# Patient Record
Sex: Male | Born: 1945
Health system: Southern US, Community
[De-identification: ages and names within clinical notes are randomized; demographics above are authoritative.]

## PROBLEM LIST (undated history)

## (undated) DIAGNOSIS — K2210 Ulcer of esophagus without bleeding: Secondary | ICD-10-CM

## (undated) DIAGNOSIS — N400 Enlarged prostate without lower urinary tract symptoms: Secondary | ICD-10-CM

## (undated) DIAGNOSIS — K5730 Diverticulosis of large intestine without perforation or abscess without bleeding: Secondary | ICD-10-CM

## (undated) DIAGNOSIS — S0101XA Laceration without foreign body of scalp, initial encounter: Secondary | ICD-10-CM

## (undated) DIAGNOSIS — M5126 Other intervertebral disc displacement, lumbar region: Secondary | ICD-10-CM

## (undated) DIAGNOSIS — H269 Unspecified cataract: Secondary | ICD-10-CM

## (undated) DIAGNOSIS — K635 Polyp of colon: Secondary | ICD-10-CM

## (undated) DIAGNOSIS — I10 Essential (primary) hypertension: Secondary | ICD-10-CM

## (undated) DIAGNOSIS — R0602 Shortness of breath: Secondary | ICD-10-CM

## (undated) DIAGNOSIS — E785 Hyperlipidemia, unspecified: Secondary | ICD-10-CM

## (undated) DIAGNOSIS — M069 Rheumatoid arthritis, unspecified: Secondary | ICD-10-CM

## (undated) DIAGNOSIS — Z202 Contact with and (suspected) exposure to infections with a predominantly sexual mode of transmission: Secondary | ICD-10-CM

## (undated) DIAGNOSIS — L259 Unspecified contact dermatitis, unspecified cause: Secondary | ICD-10-CM

## (undated) DIAGNOSIS — K219 Gastro-esophageal reflux disease without esophagitis: Secondary | ICD-10-CM

## (undated) DIAGNOSIS — I219 Acute myocardial infarction, unspecified: Secondary | ICD-10-CM

## (undated) DIAGNOSIS — T7840XA Allergy, unspecified, initial encounter: Secondary | ICD-10-CM

## (undated) DIAGNOSIS — N200 Calculus of kidney: Secondary | ICD-10-CM

## (undated) DIAGNOSIS — I2510 Atherosclerotic heart disease of native coronary artery without angina pectoris: Secondary | ICD-10-CM

## (undated) DIAGNOSIS — Z01810 Encounter for preprocedural cardiovascular examination: Secondary | ICD-10-CM

## (undated) DIAGNOSIS — J449 Chronic obstructive pulmonary disease, unspecified: Secondary | ICD-10-CM

## (undated) DIAGNOSIS — S060X1A Concussion with loss of consciousness of 30 minutes or less, initial encounter: Secondary | ICD-10-CM

## (undated) HISTORY — DX: Unspecified contact dermatitis, unspecified cause: L25.9

## (undated) HISTORY — DX: Rheumatoid arthritis, unspecified: M06.9

## (undated) HISTORY — DX: Allergy, unspecified, initial encounter: T78.40XA

## (undated) HISTORY — DX: Encounter for preprocedural cardiovascular examination: Z01.810

## (undated) HISTORY — DX: Chronic obstructive pulmonary disease, unspecified: J44.9

## (undated) HISTORY — DX: Hyperlipidemia, unspecified: E78.5

## (undated) HISTORY — DX: Calculus of kidney: N20.0

## (undated) HISTORY — PX: KNEE ARTHROSCOPY: SHX127

## (undated) HISTORY — DX: Acute myocardial infarction, unspecified: I21.9

## (undated) HISTORY — DX: Contact with and (suspected) exposure to infections with a predominantly sexual mode of transmission: Z20.2

## (undated) HISTORY — DX: Atherosclerotic heart disease of native coronary artery without angina pectoris: I25.10

## (undated) HISTORY — DX: Benign prostatic hyperplasia without lower urinary tract symptoms: N40.0

## (undated) HISTORY — PX: TONSILLECTOMY: SUR1361

## (undated) HISTORY — DX: Diverticulosis of large intestine without perforation or abscess without bleeding: K57.30

## (undated) HISTORY — DX: Essential (primary) hypertension: I10

## (undated) HISTORY — PX: POLYPECTOMY: SHX149

## (undated) HISTORY — PX: UPPER GASTROINTESTINAL ENDOSCOPY: SHX188

## (undated) HISTORY — PX: CORONARY ANGIOPLASTY WITH STENT PLACEMENT: SHX49

## (undated) HISTORY — PX: BACK SURGERY: SHX140

## (undated) HISTORY — DX: Polyp of colon: K63.5

## (undated) HISTORY — DX: Laceration without foreign body of scalp, initial encounter: S01.01XA

## (undated) HISTORY — PX: EYE SURGERY: SHX253

## (undated) HISTORY — DX: Ulcer of esophagus without bleeding: K22.10

## (undated) HISTORY — DX: Unspecified cataract: H26.9

## (undated) HISTORY — DX: Gastro-esophageal reflux disease without esophagitis: K21.9

## (undated) HISTORY — DX: Shortness of breath: R06.02

## (undated) HISTORY — DX: Concussion with loss of consciousness of 30 minutes or less, initial encounter: S06.0X1A

---

## 2003-01-20 ENCOUNTER — Ambulatory Visit (HOSPITAL_BASED_OUTPATIENT_CLINIC_OR_DEPARTMENT_OTHER): Admission: RE | Admit: 2003-01-20 | Discharge: 2003-01-20 | Payer: Self-pay | Admitting: Urology

## 2003-07-02 ENCOUNTER — Encounter: Admission: RE | Admit: 2003-07-02 | Discharge: 2003-07-02 | Payer: Self-pay | Admitting: Surgery

## 2003-10-21 ENCOUNTER — Encounter: Payer: Self-pay | Admitting: Gastroenterology

## 2003-10-21 DIAGNOSIS — D126 Benign neoplasm of colon, unspecified: Secondary | ICD-10-CM | POA: Insufficient documentation

## 2003-10-21 DIAGNOSIS — K5730 Diverticulosis of large intestine without perforation or abscess without bleeding: Secondary | ICD-10-CM | POA: Insufficient documentation

## 2003-11-25 ENCOUNTER — Emergency Department (HOSPITAL_COMMUNITY): Admission: EM | Admit: 2003-11-25 | Discharge: 2003-11-26 | Payer: Self-pay | Admitting: Emergency Medicine

## 2005-01-12 ENCOUNTER — Ambulatory Visit: Payer: Self-pay | Admitting: Family Medicine

## 2005-01-31 ENCOUNTER — Emergency Department (HOSPITAL_COMMUNITY): Admission: EM | Admit: 2005-01-31 | Discharge: 2005-01-31 | Payer: Self-pay | Admitting: Family Medicine

## 2005-03-05 ENCOUNTER — Ambulatory Visit: Payer: Self-pay | Admitting: Family Medicine

## 2005-04-07 ENCOUNTER — Emergency Department (HOSPITAL_COMMUNITY): Admission: EM | Admit: 2005-04-07 | Discharge: 2005-04-07 | Payer: Self-pay | Admitting: Family Medicine

## 2005-08-10 ENCOUNTER — Emergency Department (HOSPITAL_COMMUNITY): Admission: EM | Admit: 2005-08-10 | Discharge: 2005-08-10 | Payer: Self-pay | Admitting: Emergency Medicine

## 2006-04-26 ENCOUNTER — Emergency Department (HOSPITAL_COMMUNITY): Admission: EM | Admit: 2006-04-26 | Discharge: 2006-04-26 | Payer: Self-pay | Admitting: Family Medicine

## 2006-05-31 ENCOUNTER — Encounter: Payer: Self-pay | Admitting: Family Medicine

## 2006-11-04 DIAGNOSIS — N138 Other obstructive and reflux uropathy: Secondary | ICD-10-CM | POA: Insufficient documentation

## 2006-11-04 DIAGNOSIS — N401 Enlarged prostate with lower urinary tract symptoms: Secondary | ICD-10-CM | POA: Insufficient documentation

## 2006-11-04 DIAGNOSIS — N400 Enlarged prostate without lower urinary tract symptoms: Secondary | ICD-10-CM | POA: Insufficient documentation

## 2006-12-12 ENCOUNTER — Ambulatory Visit: Payer: Self-pay | Admitting: Family Medicine

## 2006-12-12 DIAGNOSIS — L259 Unspecified contact dermatitis, unspecified cause: Secondary | ICD-10-CM | POA: Insufficient documentation

## 2007-01-09 ENCOUNTER — Emergency Department (HOSPITAL_COMMUNITY): Admission: EM | Admit: 2007-01-09 | Discharge: 2007-01-09 | Payer: Self-pay | Admitting: Family Medicine

## 2007-03-09 ENCOUNTER — Emergency Department (HOSPITAL_COMMUNITY): Admission: EM | Admit: 2007-03-09 | Discharge: 2007-03-09 | Payer: Self-pay | Admitting: Emergency Medicine

## 2007-06-06 ENCOUNTER — Ambulatory Visit: Payer: Self-pay | Admitting: Family Medicine

## 2007-06-13 ENCOUNTER — Encounter: Payer: Self-pay | Admitting: Family Medicine

## 2007-07-01 ENCOUNTER — Ambulatory Visit: Payer: Self-pay | Admitting: Gastroenterology

## 2007-07-01 DIAGNOSIS — R1310 Dysphagia, unspecified: Secondary | ICD-10-CM | POA: Insufficient documentation

## 2007-07-01 DIAGNOSIS — N200 Calculus of kidney: Secondary | ICD-10-CM | POA: Insufficient documentation

## 2007-07-02 ENCOUNTER — Encounter: Payer: Self-pay | Admitting: Gastroenterology

## 2007-07-02 ENCOUNTER — Ambulatory Visit: Payer: Self-pay | Admitting: Gastroenterology

## 2007-07-03 ENCOUNTER — Encounter: Payer: Self-pay | Admitting: Family Medicine

## 2007-07-04 ENCOUNTER — Telehealth: Payer: Self-pay | Admitting: Family Medicine

## 2007-07-09 ENCOUNTER — Encounter: Payer: Self-pay | Admitting: Gastroenterology

## 2007-07-22 ENCOUNTER — Telehealth: Payer: Self-pay | Admitting: Family Medicine

## 2008-10-14 ENCOUNTER — Encounter (INDEPENDENT_AMBULATORY_CARE_PROVIDER_SITE_OTHER): Payer: Self-pay | Admitting: *Deleted

## 2009-01-05 ENCOUNTER — Telehealth: Payer: Self-pay | Admitting: Family Medicine

## 2009-01-07 ENCOUNTER — Ambulatory Visit: Payer: Self-pay | Admitting: Family Medicine

## 2009-01-07 LAB — CONVERTED CEMR LAB
Bilirubin Urine: NEGATIVE
Blood in Urine, dipstick: NEGATIVE
Glucose, Urine, Semiquant: NEGATIVE
Ketones, urine, test strip: NEGATIVE
Nitrite: NEGATIVE
Specific Gravity, Urine: 1.02
Urobilinogen, UA: 1
WBC Urine, dipstick: NEGATIVE
pH: 7

## 2009-01-10 LAB — CONVERTED CEMR LAB
ALT: 22 units/L (ref 0–53)
AST: 24 units/L (ref 0–37)
Albumin: 3.9 g/dL (ref 3.5–5.2)
Alkaline Phosphatase: 46 units/L (ref 39–117)
BUN: 12 mg/dL (ref 6–23)
Basophils Absolute: 0.1 10*3/uL (ref 0.0–0.1)
Basophils Relative: 2 % (ref 0.0–3.0)
Bilirubin, Direct: 0.1 mg/dL (ref 0.0–0.3)
CO2: 30 meq/L (ref 19–32)
Calcium: 9 mg/dL (ref 8.4–10.5)
Chloride: 107 meq/L (ref 96–112)
Cholesterol: 178 mg/dL (ref 0–200)
Creatinine, Ser: 0.9 mg/dL (ref 0.4–1.5)
Eosinophils Absolute: 0.7 10*3/uL (ref 0.0–0.7)
Eosinophils Relative: 9.7 % — ABNORMAL HIGH (ref 0.0–5.0)
GFR calc non Af Amer: 90.41 mL/min (ref >60)
Glucose, Bld: 92 mg/dL (ref 70–99)
HCT: 42.8 % (ref 39.0–52.0)
HDL: 39.6 mg/dL (ref >39.00)
Hemoglobin: 14.3 g/dL (ref 13.0–17.0)
LDL Cholesterol: 111 mg/dL — ABNORMAL HIGH (ref 0–99)
Lymphocytes Relative: 26.3 % (ref 12.0–46.0)
Lymphs Abs: 1.8 10*3/uL (ref 0.7–4.0)
MCHC: 33.5 g/dL (ref 30.0–36.0)
MCV: 90.2 fL (ref 78.0–100.0)
Monocytes Absolute: 0.7 10*3/uL (ref 0.1–1.0)
Monocytes Relative: 10.5 % (ref 3.0–12.0)
Neutro Abs: 3.6 10*3/uL (ref 1.4–7.7)
Neutrophils Relative %: 51.5 % (ref 43.0–77.0)
PSA: 1.86 ng/mL (ref 0.10–4.00)
Platelets: 201 10*3/uL (ref 150.0–400.0)
Potassium: 4.4 meq/L (ref 3.5–5.1)
RBC: 4.75 M/uL (ref 4.22–5.81)
RDW: 12.6 % (ref 11.5–14.6)
Sodium: 142 meq/L (ref 135–145)
TSH: 1.76 microintl units/mL (ref 0.35–5.50)
Total Bilirubin: 0.9 mg/dL (ref 0.3–1.2)
Total CHOL/HDL Ratio: 4
Total Protein: 7 g/dL (ref 6.0–8.3)
Triglycerides: 137 mg/dL (ref 0.0–149.0)
VLDL: 27.4 mg/dL (ref 0.0–40.0)
WBC: 6.9 10*3/uL (ref 4.5–10.5)

## 2009-01-11 ENCOUNTER — Ambulatory Visit: Payer: Self-pay | Admitting: Family Medicine

## 2009-01-13 ENCOUNTER — Telehealth (INDEPENDENT_AMBULATORY_CARE_PROVIDER_SITE_OTHER): Payer: Self-pay

## 2009-01-17 ENCOUNTER — Encounter (HOSPITAL_COMMUNITY): Admission: RE | Admit: 2009-01-17 | Discharge: 2009-02-04 | Payer: Self-pay | Admitting: Family Medicine

## 2009-01-17 ENCOUNTER — Ambulatory Visit: Payer: Self-pay

## 2009-01-17 ENCOUNTER — Ambulatory Visit: Payer: Self-pay | Admitting: Cardiology

## 2009-02-11 ENCOUNTER — Ambulatory Visit: Payer: Self-pay | Admitting: Gastroenterology

## 2009-02-14 ENCOUNTER — Ambulatory Visit: Payer: Self-pay | Admitting: Gastroenterology

## 2009-02-17 ENCOUNTER — Encounter: Payer: Self-pay | Admitting: Gastroenterology

## 2009-02-17 HISTORY — PX: ESOPHAGOGASTRODUODENOSCOPY (EGD) WITH ESOPHAGEAL DILATION: SHX5812

## 2009-02-22 ENCOUNTER — Emergency Department (HOSPITAL_COMMUNITY): Admission: EM | Admit: 2009-02-22 | Discharge: 2009-02-22 | Payer: Self-pay | Admitting: Emergency Medicine

## 2009-02-25 ENCOUNTER — Encounter: Payer: Self-pay | Admitting: Family Medicine

## 2009-02-28 ENCOUNTER — Ambulatory Visit: Payer: Self-pay | Admitting: Family Medicine

## 2009-02-28 DIAGNOSIS — S060X1A Concussion with loss of consciousness of 30 minutes or less, initial encounter: Secondary | ICD-10-CM | POA: Insufficient documentation

## 2009-02-28 DIAGNOSIS — S0100XA Unspecified open wound of scalp, initial encounter: Secondary | ICD-10-CM | POA: Insufficient documentation

## 2009-03-01 ENCOUNTER — Ambulatory Visit: Payer: Self-pay | Admitting: Gastroenterology

## 2009-03-08 ENCOUNTER — Encounter: Payer: Self-pay | Admitting: Gastroenterology

## 2009-04-29 ENCOUNTER — Encounter: Payer: Self-pay | Admitting: Family Medicine

## 2009-05-06 ENCOUNTER — Ambulatory Visit: Payer: Self-pay | Admitting: Family Medicine

## 2009-05-06 LAB — CONVERTED CEMR LAB
Bilirubin Urine: NEGATIVE
Blood in Urine, dipstick: NEGATIVE
Glucose, Urine, Semiquant: NEGATIVE
Ketones, urine, test strip: NEGATIVE
Nitrite: NEGATIVE
Protein, U semiquant: NEGATIVE
Specific Gravity, Urine: 1.02
Urobilinogen, UA: 1
WBC Urine, dipstick: NEGATIVE
pH: 5.5

## 2009-05-31 ENCOUNTER — Ambulatory Visit: Payer: Self-pay | Admitting: Family Medicine

## 2009-05-31 DIAGNOSIS — R0602 Shortness of breath: Secondary | ICD-10-CM | POA: Insufficient documentation

## 2009-06-01 ENCOUNTER — Telehealth: Payer: Self-pay | Admitting: Family Medicine

## 2009-06-02 ENCOUNTER — Encounter (INDEPENDENT_AMBULATORY_CARE_PROVIDER_SITE_OTHER): Payer: Self-pay | Admitting: *Deleted

## 2009-06-02 ENCOUNTER — Ambulatory Visit: Payer: Self-pay | Admitting: Cardiology

## 2009-06-02 ENCOUNTER — Telehealth (INDEPENDENT_AMBULATORY_CARE_PROVIDER_SITE_OTHER): Payer: Self-pay | Admitting: Physician Assistant

## 2009-06-03 ENCOUNTER — Ambulatory Visit: Payer: Self-pay | Admitting: Cardiology

## 2009-06-03 ENCOUNTER — Inpatient Hospital Stay (HOSPITAL_BASED_OUTPATIENT_CLINIC_OR_DEPARTMENT_OTHER): Admission: RE | Admit: 2009-06-03 | Discharge: 2009-06-03 | Payer: Self-pay | Admitting: Cardiology

## 2009-06-03 ENCOUNTER — Ambulatory Visit (HOSPITAL_COMMUNITY): Admission: AD | Admit: 2009-06-03 | Discharge: 2009-06-04 | Payer: Self-pay | Admitting: Cardiology

## 2009-06-03 LAB — CONVERTED CEMR LAB
INR: 1 (ref <1.50)
Prothrombin Time: 13.1 s (ref 11.6–15.2)
aPTT: 28 s (ref 24–37)

## 2009-06-07 ENCOUNTER — Encounter: Payer: Self-pay | Admitting: Cardiology

## 2009-06-07 ENCOUNTER — Telehealth: Payer: Self-pay | Admitting: Cardiology

## 2009-06-07 ENCOUNTER — Inpatient Hospital Stay (HOSPITAL_COMMUNITY): Admission: EM | Admit: 2009-06-07 | Discharge: 2009-06-09 | Payer: Self-pay | Admitting: Radiology

## 2009-06-07 ENCOUNTER — Ambulatory Visit: Payer: Self-pay | Admitting: Cardiology

## 2009-06-10 DIAGNOSIS — E785 Hyperlipidemia, unspecified: Secondary | ICD-10-CM | POA: Insufficient documentation

## 2009-06-10 DIAGNOSIS — I25118 Atherosclerotic heart disease of native coronary artery with other forms of angina pectoris: Secondary | ICD-10-CM | POA: Insufficient documentation

## 2009-06-10 DIAGNOSIS — K219 Gastro-esophageal reflux disease without esophagitis: Secondary | ICD-10-CM | POA: Insufficient documentation

## 2009-06-10 DIAGNOSIS — K2270 Barrett's esophagus without dysplasia: Secondary | ICD-10-CM | POA: Insufficient documentation

## 2009-06-15 ENCOUNTER — Encounter: Payer: Self-pay | Admitting: Cardiology

## 2009-06-20 ENCOUNTER — Ambulatory Visit: Payer: Self-pay | Admitting: Cardiology

## 2009-06-21 ENCOUNTER — Ambulatory Visit: Payer: Self-pay | Admitting: Cardiology

## 2009-06-22 ENCOUNTER — Encounter: Payer: Self-pay | Admitting: Cardiology

## 2009-06-30 LAB — CONVERTED CEMR LAB
ALT: 25 units/L (ref 0–53)
AST: 23 units/L (ref 0–37)
Albumin: 3.6 g/dL (ref 3.5–5.2)
Alkaline Phosphatase: 44 units/L (ref 39–117)
Bilirubin, Direct: 0.2 mg/dL (ref 0.0–0.3)
Cholesterol: 141 mg/dL (ref 0–200)
HDL: 36.6 mg/dL — ABNORMAL LOW (ref >39.00)
LDL Cholesterol: 81 mg/dL (ref 0–99)
Total Bilirubin: 1.1 mg/dL (ref 0.3–1.2)
Total CHOL/HDL Ratio: 4
Total Protein: 6.4 g/dL (ref 6.0–8.3)
Triglycerides: 116 mg/dL (ref 0.0–149.0)
VLDL: 23.2 mg/dL (ref 0.0–40.0)

## 2009-10-14 ENCOUNTER — Emergency Department (HOSPITAL_COMMUNITY): Admission: EM | Admit: 2009-10-14 | Discharge: 2009-10-14 | Payer: Self-pay | Admitting: Emergency Medicine

## 2009-10-21 ENCOUNTER — Ambulatory Visit: Payer: Self-pay | Admitting: Family Medicine

## 2009-10-21 DIAGNOSIS — S139XXA Sprain of joints and ligaments of unspecified parts of neck, initial encounter: Secondary | ICD-10-CM | POA: Insufficient documentation

## 2009-10-21 DIAGNOSIS — S335XXA Sprain of ligaments of lumbar spine, initial encounter: Secondary | ICD-10-CM | POA: Insufficient documentation

## 2009-11-02 ENCOUNTER — Encounter (INDEPENDENT_AMBULATORY_CARE_PROVIDER_SITE_OTHER): Payer: Self-pay | Admitting: *Deleted

## 2009-11-08 ENCOUNTER — Encounter
Admission: RE | Admit: 2009-11-08 | Discharge: 2010-02-01 | Payer: Self-pay | Source: Home / Self Care | Attending: Family Medicine | Admitting: Family Medicine

## 2009-11-08 ENCOUNTER — Encounter: Payer: Self-pay | Admitting: Family Medicine

## 2009-11-24 ENCOUNTER — Ambulatory Visit: Payer: Self-pay | Admitting: Cardiology

## 2009-12-16 ENCOUNTER — Ambulatory Visit: Payer: Self-pay | Admitting: Family Medicine

## 2009-12-16 DIAGNOSIS — H9319 Tinnitus, unspecified ear: Secondary | ICD-10-CM | POA: Insufficient documentation

## 2009-12-16 DIAGNOSIS — R42 Dizziness and giddiness: Secondary | ICD-10-CM | POA: Insufficient documentation

## 2009-12-21 ENCOUNTER — Ambulatory Visit: Payer: Self-pay | Admitting: Family Medicine

## 2009-12-23 ENCOUNTER — Telehealth: Payer: Self-pay | Admitting: Family Medicine

## 2009-12-23 LAB — CONVERTED CEMR LAB
BUN: 23 mg/dL (ref 6–23)
Creatinine, Ser: 1 mg/dL (ref 0.4–1.5)

## 2009-12-28 ENCOUNTER — Encounter: Admission: RE | Admit: 2009-12-28 | Discharge: 2009-12-28 | Payer: Self-pay | Admitting: Family Medicine

## 2010-01-04 ENCOUNTER — Telehealth: Payer: Self-pay | Admitting: Family Medicine

## 2010-01-04 DIAGNOSIS — R51 Headache: Secondary | ICD-10-CM | POA: Insufficient documentation

## 2010-01-04 DIAGNOSIS — R519 Headache, unspecified: Secondary | ICD-10-CM | POA: Insufficient documentation

## 2010-01-26 ENCOUNTER — Ambulatory Visit: Payer: Self-pay | Admitting: Family Medicine

## 2010-01-26 LAB — CONVERTED CEMR LAB
Bilirubin Urine: NEGATIVE
Blood in Urine, dipstick: NEGATIVE
Glucose, Urine, Semiquant: NEGATIVE
Ketones, urine, test strip: NEGATIVE
Nitrite: NEGATIVE
Protein, U semiquant: NEGATIVE
Specific Gravity, Urine: 1.025
Urobilinogen, UA: 0.2
WBC Urine, dipstick: NEGATIVE
pH: 5.5

## 2010-02-01 ENCOUNTER — Encounter: Payer: Self-pay | Admitting: Family Medicine

## 2010-02-01 ENCOUNTER — Ambulatory Visit: Payer: Self-pay | Admitting: Family Medicine

## 2010-02-01 ENCOUNTER — Ambulatory Visit: Admission: RE | Admit: 2010-02-01 | Discharge: 2010-02-01 | Payer: Self-pay | Source: Home / Self Care

## 2010-02-01 DIAGNOSIS — R55 Syncope and collapse: Secondary | ICD-10-CM | POA: Insufficient documentation

## 2010-02-01 LAB — CONVERTED CEMR LAB
ALT: 23 units/L (ref 0–53)
AST: 25 units/L (ref 0–37)
Albumin: 3.8 g/dL (ref 3.5–5.2)
Alkaline Phosphatase: 53 units/L (ref 39–117)
BUN: 19 mg/dL (ref 6–23)
Basophils Absolute: 0 10*3/uL (ref 0.0–0.1)
Basophils Relative: 0.6 % (ref 0.0–3.0)
Bilirubin, Direct: 0.1 mg/dL (ref 0.0–0.3)
CO2: 25 meq/L (ref 19–32)
Calcium: 9.2 mg/dL (ref 8.4–10.5)
Chloride: 104 meq/L (ref 96–112)
Cholesterol: 141 mg/dL (ref 0–200)
Creatinine, Ser: 0.9 mg/dL (ref 0.4–1.5)
Eosinophils Absolute: 0.6 10*3/uL (ref 0.0–0.7)
Eosinophils Relative: 8 % — ABNORMAL HIGH (ref 0.0–5.0)
GFR calc non Af Amer: 85.7 mL/min (ref >60.00)
Glucose, Bld: 100 mg/dL — ABNORMAL HIGH (ref 70–99)
HCT: 42.7 % (ref 39.0–52.0)
HDL: 34.6 mg/dL — ABNORMAL LOW (ref >39.00)
Hemoglobin: 14.5 g/dL (ref 13.0–17.0)
LDL Cholesterol: 73 mg/dL (ref 0–99)
Lymphocytes Relative: 23.8 % (ref 12.0–46.0)
Lymphs Abs: 1.7 10*3/uL (ref 0.7–4.0)
MCHC: 33.9 g/dL (ref 30.0–36.0)
MCV: 88.2 fL (ref 78.0–100.0)
Monocytes Absolute: 0.7 10*3/uL (ref 0.1–1.0)
Monocytes Relative: 10 % (ref 3.0–12.0)
Neutro Abs: 4.1 10*3/uL (ref 1.4–7.7)
Neutrophils Relative %: 57.6 % (ref 43.0–77.0)
PSA: 1.82 ng/mL (ref 0.10–4.00)
Platelets: 206 10*3/uL (ref 150.0–400.0)
Potassium: 4.5 meq/L (ref 3.5–5.1)
RBC: 4.85 M/uL (ref 4.22–5.81)
RDW: 14 % (ref 11.5–14.6)
Sodium: 137 meq/L (ref 135–145)
TSH: 3.03 microintl units/mL (ref 0.35–5.50)
Total Bilirubin: 0.7 mg/dL (ref 0.3–1.2)
Total CHOL/HDL Ratio: 4
Total Protein: 7 g/dL (ref 6.0–8.3)
Triglycerides: 168 mg/dL — ABNORMAL HIGH (ref 0.0–149.0)
VLDL: 33.6 mg/dL (ref 0.0–40.0)
WBC: 7.2 10*3/uL (ref 4.5–10.5)

## 2010-02-22 ENCOUNTER — Encounter: Payer: Self-pay | Admitting: Gastroenterology

## 2010-02-23 ENCOUNTER — Encounter: Payer: Self-pay | Admitting: Family Medicine

## 2010-03-05 LAB — CONVERTED CEMR LAB
ALT: 45 units/L (ref 0–53)
AST: 33 units/L (ref 0–37)
Albumin: 3.6 g/dL (ref 3.5–5.2)
Alkaline Phosphatase: 45 units/L (ref 39–117)
BUN: 12 mg/dL (ref 6–23)
Basophils Absolute: 0 10*3/uL (ref 0.0–0.1)
Basophils Relative: 0.6 % (ref 0.0–3.0)
Bilirubin, Direct: 0 mg/dL (ref 0.0–0.3)
CO2: 28 meq/L (ref 19–32)
Calcium: 9.5 mg/dL (ref 8.4–10.5)
Chloride: 108 meq/L (ref 96–112)
Creatinine, Ser: 1.1 mg/dL (ref 0.4–1.5)
Eosinophils Absolute: 0.1 10*3/uL (ref 0.0–0.7)
Eosinophils Relative: 2.8 % (ref 0.0–5.0)
GFR calc non Af Amer: 71.63 mL/min (ref >60)
Glucose, Bld: 93 mg/dL (ref 70–99)
HCT: 43.2 % (ref 39.0–52.0)
Hemoglobin: 15.1 g/dL (ref 13.0–17.0)
Lymphocytes Relative: 27.1 % (ref 12.0–46.0)
Lymphs Abs: 1.3 10*3/uL (ref 0.7–4.0)
MCHC: 35.1 g/dL (ref 30.0–36.0)
MCV: 87.9 fL (ref 78.0–100.0)
Monocytes Absolute: 0.6 10*3/uL (ref 0.1–1.0)
Monocytes Relative: 13 % — ABNORMAL HIGH (ref 3.0–12.0)
Neutro Abs: 2.7 10*3/uL (ref 1.4–7.7)
Neutrophils Relative %: 56.5 % (ref 43.0–77.0)
Platelets: 153 10*3/uL (ref 150.0–400.0)
Potassium: 3.9 meq/L (ref 3.5–5.1)
RBC: 4.91 M/uL (ref 4.22–5.81)
RDW: 14 % (ref 11.5–14.6)
Sodium: 143 meq/L (ref 135–145)
TSH: 2.1 microintl units/mL (ref 0.35–5.50)
Total Bilirubin: 0.5 mg/dL (ref 0.3–1.2)
Total Protein: 6.9 g/dL (ref 6.0–8.3)
Vitamin B-12: 351 pg/mL (ref 211–911)
WBC: 4.7 10*3/uL (ref 4.5–10.5)

## 2010-03-09 NOTE — Assessment & Plan Note (Signed)
Summary: dysphagia sch w. Tyrone Roman [phone].Marland Kitchenem   History of Present Illness Visit Type: new patient Primary GI MD: Melvia Heaps MD [facility] Primary [place] Magoon: Larey Dresser MD Chief Complaint: Dysphagia-liquids/foods History of Present Illness:   Tyrone Roman is a pleasant 65 year old white male referred to the courtesy of Dr. Clent Ridges for evaluation. He is complaining of dysphagia to solids. He's had some chest discomfort with the passage of food as well. He has pyrosis for which he has taken Nexium with improvement. He denies cough hoarseness or sore throat.    GI Review of Systems    Reports acid reflux and  heartburn.       Reports hemorrhoids.        Updated Prior Medication List: BAYER ASPIRIN 325 MG  TABS (ASPIRIN) 1 by mouth once daily VIAGRA 100 MG TABS (SILDENAFIL CITRATE) 1 by mouth as needed NEXIUM 40 MG  CPDR (ESOMEPRAZOLE MAGNESIUM) once daily  Current Allergies (reviewed today): ! KEFLEX ! BIAXIN ! * LIDODERM PATCHES  Past Surgical History:    Tonsillectomy    colonoscopy 10-21-03 per Dr. Arlyce Dice, repeat 5 yrs    Lt knee surgery 25 years ago 62   Family History:    Family History of Arthritis    Family History of Colon CA 1st degree relative <60 Father    Family History of Prostate CA 1st degree relative <50    Family History of Cardiovascular disorder/ Father  Social History:    Occupation:Sales Mgr    Married    Children : girls 4    Former Smoker 1960's    Alcohol use-yes    Drug use-no    Regular exercise-no    Occupation:     Writer:     Daily Caffeine Use    Patient does not get regular exercise.    Risk Factors:  Exercise:  no   Review of Systems       The patient complains of severe indigestion/heartburn.         mild heartburn/reflux, takes nexium   Vital Signs:  Patient Profile:   65 Years Old Male Height:     73 inches Weight:      228 pounds BMI:     30.19 Pulse rate:   80 / minute BP sitting:   114 / 80   (left arm)  Vitals Entered By: Lowry Ram CMA (Jul 01, 2007 8:57 AM)                  Physical Exam  He is a well-developed well-nourished male  Physical Exam: General:   WDWN HEENT:   anicteric.  No pharyngeal abnormalities Neck:   No masses, thyroidmegaly Nodes:   No cervical, axillary, inguinal adenopathy Chest:    Clear to auscultation Cardiac:   No murmurs, gallops, rubs Abdomen:   BS active.  No abd masses, tenderness, organomegaly Rectal:   Deferred Extremities:   No cyanosis, clubbing, edema Skeletal:   No deformities Neuro:   Alert, oriented x3.  No focal abnormalities        Impression & Recommendations:  Problem # 1:  DYSPHAGIA UNSPECIFIED (ICD-787.20) The patient likely has a peptic esophageal stricture. This could be responsible for his chest discomfort as well.    Recommendations #1 continue Nexium #2 upper endoscopy with dilatation as indicated  Problem # 2:  FAMILY HISTORY OF COLON CA 1ST DEGREE RELATIVE <60 (ICD-V16.0) Plan follow up colonoscopy in 5 years     ]  Appended Document:  Orders Update    Clinical Lists Changes  Orders: Added new Test order of EGD (EGD) - Signed

## 2010-03-09 NOTE — Assessment & Plan Note (Signed)
Summary: cpx/njr   Vital Signs:  Patient profile:   65 year old male Height:      72 inches Weight:      234 pounds O2 Sat:      97 % Temp:     97.5 degrees F Pulse rate:   116 / minute BP sitting:   120 / 80  (left arm) Cuff size:   large  Vitals Entered By: Pura Spice, RN (February 01, 2010 10:24 AM) CC: cpx   has appt with neurosurgeon in [place] requesting copy of CT scan  Is Patient Diabetic? No   History of Present Illness: 65 yr old male for a cpx. He feels fine except for the near syncopal spells we talked about at our last visit. He still gets these once in awhile. They usually occur when he looks up or moves his head back quickly. No other symptoms. He had a normal brain MRI recently. He is set to see [facility] on 02-17-10.   Allergies: 1)  ! Keflex 2)  ! Biaxin 3)  ! * Lidoderm Patches  Past History:  Past Medical History: CAD, NATIVE VESSEL (ICD-414.01), sees Dr. Juanito Doom HYPERLIPIDEMIA (ICD-272.4) SHORTNESS OF BREATH (ICD-786.05) BARRETTS ESOPHAGUS (ICD-530.85) GERD (ICD-530.81) DYSPHAGIA UNSPECIFIED (ICD-787.20) PRE-OPERATIVE CARDIOVASCULAR EXAMINATION (ICD-V72.81) CONTACT WITH OR EXPOSURE TO VENEREAL DISEASES (ICD-V01.6) LACERATION, SCALP (ICD-873.0) CONCUSSION WITH LOC OF 30 MINUTES OR LESS (ICD-850.11) NEPHROLITHIASIS (ICD-592.0) COLONIC POLYPS (ICD-211.3) DIVERTICULOSIS, COLON (ICD-562.10 ) CONTACT DERMATITIS (ICD-692.9) BENIGN PROSTATIC HYPERTROPHY (ICD-600.00)    Past Surgical History: Reviewed history from 06/10/2009 and no changes required. Tonsillectomy colonoscopy 03-01-09 per Dr. Arlyce Dice, had benign polyps,  repeat 5 yrs Lt knee surgery 25 years ago 1974 EGD with dilatation per Dr. Arlyce Dice 02-17-09, showed Barretts esophagus  Family History: Reviewed history from 07/01/2007 and no changes required. Family History of Arthritis Family History of Colon CA 1st degree relative <60 Father Family History of Prostate CA 1st  degree relative <50 Family History of Cardiovascular disorder/ Father  Social History: Reviewed history from 05/31/2009 and no changes required. Occupation:Sales Mgr Married Children : girls 4 Former Smoker 1960's Alcohol use-yes Drug use-no Regular exercise-no Travel HistorySales Mgr:  Daily Caffeine Use Patient does not get regular exercise.   Review of Systems  The patient denies anorexia, fever, weight loss, weight gain, vision loss, decreased hearing, hoarseness, chest pain, syncope, dyspnea on exertion, peripheral edema, prolonged cough, headaches, hemoptysis, abdominal pain, melena, hematochezia, severe indigestion/heartburn, hematuria, incontinence, genital sores, muscle weakness, suspicious skin lesions, transient blindness, difficulty walking, depression, unusual weight change, abnormal bleeding, enlarged lymph nodes, angioedema, breast masses, and testicular masses.    Physical Exam  General:  Well-developed,well-nourished,in no acute distress; alert,appropriate and cooperative throughout examination Head:  Normocephalic and atraumatic without obvious abnormalities. No apparent alopecia or balding. Eyes:  No corneal or conjunctival inflammation noted. EOMI. Perrla. Funduscopic exam benign, without hemorrhages, exudates or papilledema. Vision grossly normal. Ears:  External ear exam shows no significant lesions or deformities.  Otoscopic examination reveals clear canals, tympanic membranes are intact bilaterally without bulging, retraction, inflammation or discharge. Hearing is grossly normal bilaterally. Nose:  External nasal examination shows no deformity or inflammation. Nasal mucosa are pink and moist without lesions or exudates. Mouth:  Oral mucosa and oropharynx without lesions or exudates.  Teeth in good repair. Neck:  No deformities, masses, or tenderness noted. No carotid bruits  Chest Wall:  No deformities, masses, tenderness or gynecomastia noted. Lungs:  Normal  respiratory effort, chest expands symmetrically.  Lungs are clear to auscultation, no crackles or wheezes. Heart:  Normal rate and regular rhythm. S1 and S2 normal without gallop, murmur, click, rub or other extra sounds. Abdomen:  Bowel sounds positive,abdomen soft and non-tender without masses, organomegaly or hernias noted. Rectal:  No external abnormalities noted. Normal sphincter tone. No rectal masses or tenderness. Heme neg. Genitalia:  Testes bilaterally descended without nodularity, tenderness or masses. No scrotal masses or lesions. No penis lesions or urethral discharge. Prostate:  Prostate gland firm and smooth, no enlargement, nodularity, tenderness, mass, asymmetry or induration. Msk:  No deformity or scoliosis noted of thoracic or lumbar spine.   Pulses:  R and L carotid,radial,femoral,dorsalis pedis and posterior tibial pulses are full and equal bilaterally Extremities:  No clubbing, cyanosis, edema, or deformity noted with normal full range of motion of all joints.   Neurologic:  No cranial nerve deficits noted. Station and gait are normal. Plantar reflexes are down-going bilaterally. DTRs are symmetrical throughout. Sensory, motor and coordinative functions appear intact. Skin:  Intact without suspicious lesions or rashes Cervical Nodes:  No lymphadenopathy noted Axillary Nodes:  No palpable lymphadenopathy Inguinal Nodes:  No significant adenopathy Psych:  Cognition and judgment appear intact. Alert and cooperative with normal attention span and concentration. No apparent delusions, illusions, hallucinations   Impression & Recommendations:  Problem # 1:  WELL ADULT EXAM (ICD-V70.0)  Orders: Hemoccult Guaiac-1 spec.(in office) (82270)  Problem # 2:  DIZZINESS (ICD-780.4)  His updated medication list for this problem includes:    Meclizine Hcl 25 Mg Tabs (Meclizine hcl) .Marland Kitchen... 1 q 4 hours as needed dizziness  Orders: Cardiology Referral (Cardiology)  Complete Medication  List: 1)  Bayer Aspirin 325 Mg Tabs (Aspirin) .Marland Kitchen.. 1 by mouth once daily 2)  Viagra 100 Mg Tabs (Sildenafil citrate) .Marland Kitchen.. 1 by mouth as needed 3)  Omeprazole 40 Mg Cpdr (Omeprazole) .... Once daily 4)  Tamsulosin Hcl 0.4 Mg Caps (Tamsulosin hcl) .Marland Kitchen.. 1 once daily 5)  Simvastatin 40 Mg Tabs (Simvastatin) .Marland Kitchen.. 1 tab at bedtime 6)  Nitrostat 0.4 Mg Subl (Nitroglycerin) .Marland Kitchen.. 1 tablet under tongue at onset of chest pain; you may repeat every 5 minutes for up to 3 doses. 7)  Effient 10 Mg Tabs (Prasugrel hcl) .Marland Kitchen.. 1 tab once daily 8)  Flexeril 10 Mg Tabs (Cyclobenzaprine hcl) .... Three times a day as needed spasm 9)  Meclizine Hcl 25 Mg Tabs (Meclizine hcl) .Marland Kitchen.. 1 q 4 hours as needed dizziness  Patient Instructions: 1)  set up carotid dopplers soon. See Neurology as planned.    Orders Added: 1)  Est. Patient 40-64 years [99396] 2)  Hemoccult Guaiac-1 spec.(in office) [82270] 3)  Cardiology Referral [Cardiology]

## 2010-03-09 NOTE — Letter (Signed)
Summary: EGD Instructions  [facility]  [address] [place], [place] [address]   Phone: [phone]  Fax: [phone]       Tyrone Roman    Aug 15, 1945    MRN: [id number]       Procedure Day /Date:MONDAY 02/14/2009     Arrival Time: 3:00PM     Procedure Time:4:00PM     Location of Procedure:                    [facility] (4th Floor)   PREPARATION FOR ENDOSCOPY/DIL   On 02/14/2009 THE DAY OF THE PROCEDURE:  1.   No solid foods, milk or milk products are allowed after midnight the night before your procedure.  2.   Do not drink anything colored red or purple.  Avoid juices with pulp.  No orange juice.  3.  You may drink clear liquids until 2:00PM, which is 2 hours before your procedure.                                                                                                CLEAR LIQUIDS INCLUDE: Water Jello Ice Popsicles Tea (sugar ok, no milk/cream) Powdered fruit flavored drinks Coffee (sugar ok, no milk/cream) Gatorade Juice: apple, white grape, white cranberry  Lemonade Clear bullion, consomm, broth Carbonated beverages (any kind) Strained chicken noodle soup Hard Candy   MEDICATION INSTRUCTIONS  Unless otherwise instructed, you should take regular prescription medications with a small sip of water as early as possible the morning of your procedure.               OTHER INSTRUCTIONS  You will need a responsible adult at least 65 years of age to accompany you and drive you home.   This person must remain in the waiting room during your procedure.  Wear loose fitting clothing that is easily removed.  Leave jewelry and other valuables at home.  However, you may wish to bring a book to read or an iPod/MP3 player to listen to music as you wait for your procedure to start.  Remove all body piercing jewelry and leave at home.  Total time from sign-in until discharge is approximately 2-3 hours.  You should go home  directly after your procedure and rest.  You can resume normal activities the day after your procedure.  The day of your procedure you should not:   Drive   Make legal decisions   Operate machinery   Drink alcohol   Return to work  You will receive specific instructions about eating, activities and medications before you leave.    The above instructions have been reviewed and explained to me by   _______________________    I fully understand and can verbalize these instructions _____________________________ Date _________
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
## 2010-03-09 NOTE — Procedures (Signed)
Summary: EGD   EGD  Procedure date:  07/02/2007  Findings:      Location: [facility]    Patient Name: Tyrone Roman, Tyrone Roman MRN:  Procedure Procedures: Panendoscopy (EGD) CPT: 43235.    with biopsy(s)/brushing(s). CPT: D1846139.    with esophageal dilation. CPT: G9296129.  Personnel: Endoscopist: Barbette Hair. Arlyce Dice, MD.  Referred By: Gershon Crane, MD.  Indications Symptoms: Dysphagia.  History  Current Medications: Patient is not currently taking Coumadin.  Pre-Exam Physical: Performed Jul 02, 2007  Cardio-pulmonary exam, Abdominal exam WNL.  Exam Exam Info: Maximum depth of insertion Duodenum, intended Duodenum. Patient position: on left side. Vocal cords visualized. Gastric retroflexion performed. ASA Classification: I. Tolerance: good.  Sedation Meds: Patient assessed and found to be appropriate for moderate (conscious) sedation. Fentanyl 75 mcg. given IV. Versed 8 mg. given IV. Robinul 0.2 given IV. Cetacaine Spray 2 sprays given aerosolized.  Monitoring: BP and pulse monitoring done. Oximetry used. Supplemental O2 given at 2 Liters.  Findings - Normal: Proximal Esophagus to Distal Esophagus.  - Normal: Fundus to Duodenal 2nd Portion.  STRICTURE / STENOSIS: Stricture in Distal Esophagus.  Constriction: partial. 40 cm from mouth. ICD9: Esophageal Stricture: 530.3.  - Dilation: Cardia. Maloney dilator used, Diameter: 18 mm, Moderate Resistance, No Heme present on extraction. 1  total dilators used. Outcome: successful.  - MUCOSAL ABNORMALITY: Distal Esophagus to Cardia. Biopsy/Mucosal Abn taken. ICD9: Esophagitis, Unspecified: 530.10. Comment: Irregular GE junction.  Bxs were taken to r/o Barrett's Esophagus.   Assessment Abnormal examination, see findings above.  Diagnoses: 530.3: Esophageal Stricture.  530.10: Esophagitis, Unspecified.   Events  Unplanned Intervention: No unplanned interventions were required.  Unplanned Events: There were no  complications. Plans Medication(s): Await pathology. Continue current medications.  Patient Education: Patient given standard instructions for: Stenosis / Stricture. Mucosal Abnormality.  Scheduling: Office Visit, to [employer]. Arlyce Dice, MD, around Aug 02, 2007.    cc:  Gershon Crane, MD      Case #: [id number] Patient Name: Tyrone Roman, Tyrone Roman. Office Chart Number:  [id number]   MRN: [id number] Pathologist: Alden Server A. Delila Spence, MD DOB/Age  [birth year] (Age: 65)    Gender: M Date Taken:  07/02/2007 Date Received: 07/03/2007   FINAL DIAGNOSIS   ***MICROSCOPIC EXAMINATION AND DIAGNOSIS***   ESOPHAGOGASTRIC JUNCTION MUCOSA WITH MILD INFLAMMATION.  NO INTESTINAL METAPLASIA, DYSPLASIA OR MALIGNANCY IDENTIFIED (BIOPSY)   COMMENT An Alcian Blue stain is performed to determine the presence of intestinal metaplasia (goblet cell metaplasia). No intestinal metaplasia (goblet cell metaplasia) is identified with the Alcian Blue stain. The control stained appropriately. (EAA:cdc 07/04/07)   cc Date Reported:  07/04/2007     Alden Server A. Delila Spence, MD *** Electronically Signed Out By EAA ***   Clinical information Dysphagia (mj)   specimen(s) obtained Esophagus, biopsy   Gross Description Received in formalin are tan, soft tissue fragments that are submitted in toto.  Number: 2           Size:      up to 0.3 cm  (SHP:kv 07-03-07)   kv/     Signed by Louis Meckel MD on 07/09/2007 at 9:05 AM  ________________________________________________________________________    MRN: [id number]    [facility] [address] [place], [place]  [address]    Dear Tyrone Roman,  I am pleased to inform you that the biopsies taken during your recent endoscopic examination did not show any evidence of cancer upon pathologic examination.  There was inflammation only at the junction of the esophagus and stomach  which is where biopsies were taken  Additional information/recommendations:  __No further  action is needed at this time.  Please follow-up with      your primary care physician for your other healthcare needs.  __ Please call [phone] to schedule a return visit to review      your condition.  _x_ Continue with the treatment plan as outlined on the day of your      exam.  __ You should have a repeat endoscopic examination for thi_s problem              in _ months/years.   Please call us if you are having persistent problems or have questions about your condition that have not been fully answered at this time.  Sincerely,  Louis Meckel MD  This letter has been electronically signed by your physician.   Signed by Louis Meckel MD on 07/09/2007 at 9:06 AM  ________________________________________________________________________

## 2010-03-09 NOTE — Assessment & Plan Note (Signed)
Summary: np6/new onset SOB/jml   Visit Type:  new pt visit Primary Provider:  Gershon Crane MD  CC:  sob started  4 wks ago and has increasingly gotten worse .Marland Kitchenpt c/o bilateral leg pain....chest tightness...denies any edema...  History of Present Illness: Mr Kauffman today for further evaluation of chest discomfort which has been off and on since December as well as increasing shortness of breath and dyspnea on exertion particularly last week.  He is clearly a very anxious man. However, he says that his shortness of breath has gotten a lot worse over the last week. Workup in the past has included a stress exercise Myoview December of 2010. He exercised for 9 minutes had some shortness of breath and chest tightness, EKG was normal, and his stress Myoview was normal with an ejection fraction 66% with no sign of scar or ischemia. His maximum heart rate was 141 which is 89% of predicted maximum heart rate. Met level achieved was 10.4.  He saw Dr. Clent Ridges his primary care this week. Laboratory data including a CBC was normal as were a metabolic profile and hepatic function. A TSH was also checked which was normal. Chest x-ray showed some findings of COPD but no cardiomegaly a period CT scan of the chest today showed centrilobular emphysema, coronary artery atherosclerosis which was called relatively mild, and bilateral pleural plaques consistent with asbestosis. He confirms exposure to asbestosis in the past years ago.  He denies orthopnea, PND or peripheral edema. He's had no palpitations or syncope.  Current Medications (verified): 1)  Bayer Aspirin 325 Mg  Tabs (Aspirin) .Marland Kitchen.. 1 Tab 4-5 Times Weekly 2)  Viagra 100 Mg Tabs (Sildenafil Citrate) .Marland Kitchen.. 1 By Mouth As Needed 3)  Omeprazole 40 Mg Cpdr (Omeprazole) .... Once Daily 4)  Aldara 5 % Crea (Imiquimod) .... Apply As Directed 5)  Tamsulosin Hcl 0.4 Mg Caps (Tamsulosin Hcl) .Marland Kitchen.. 1 Once Daily  Allergies: 1)  ! Keflex 2)  ! Biaxin 3)  ! * Lidoderm  Patches  Past History:  Past Medical History: Last updated: 05/31/2009 Lumbar Disc Disease Benign prostatic hypertrophy, sees Dr. Earlene Plater GERD with Barretts esophagus, sees Dr. Arlyce Dice UTI's skin checks with Dr. Londell Moh normal cardiac stress test 01-17-09  Past Surgical History: Last updated: 05/31/2009 Tonsillectomy colonoscopy 03-01-09 per Dr. Arlyce Dice, had benign polyps,  repeat 5 yrs Lt knee surgery 25 years ago 1974 EGD with dilatation per Dr. Arlyce Dice 02-17-09, showed Barretts esophagus  Family History: Last updated: 07/01/2007 Family History of Arthritis Family History of Colon CA 1st degree relative <60 Father Family History of Prostate CA 1st degree relative <50 Family History of Cardiovascular disorder/ Father  Social History: Last updated: 05/31/2009 Occupation:Sales Mgr Married Children : girls 4 Former Smoker 1960's Alcohol use-yes Drug use-no Regular exercise-no Travel HistorySales Mgr:  Daily Caffeine Use Patient does not get regular exercise.   Risk Factors: Exercise: no (07/01/2007)  Risk Factors: Smoking Status: quit (11/04/2006)  Review of Systems       negative other than history of present illness  Vital Signs:  Patient profile:   65 year old male Height:      74 inches Weight:      218 pounds BMI:     28.09 Pulse rate:   72 / minute Pulse rhythm:   regular BP sitting:   92 / 60  (left arm) Cuff size:   large  Vitals Entered By: Danielle Rankin, CMA (June 02, 2009 3:53 PM)  Physical Exam  General:  alert and  oriented x3, extremely anxious. Head:  normocephalic and atraumatic Eyes:  PERRLA/EOM intact; conjunctiva and lids normal. Neck:  Neck supple, no JVD. No masses, thyromegaly or abnormal cervical nodes. Chest Chang Tiggs:  no deformities or breast masses noted Lungs:  Clear bilaterally to auscultation and percussion. Heart:  Non-displaced PMI, chest non-tender; regular rate and rhythm, S1, S2 without murmurs, rubs or gallops. Carotid  upstroke normal, no bruit. Normal abdominal aortic size, no bruits. Femorals normal pulses, no bruits. Pedals normal pulses. No edema, no varicosities. Abdomen:  Bowel sounds positive; abdomen soft and non-tender without masses, organomegaly, or hernias noted. No hepatosplenomegaly. Msk:  Back normal, normal gait. Muscle strength and tone normal. Pulses:  pulses normal in all 4 extremities Extremities:  No clubbing or cyanosis. Neurologic:  Alert and oriented x 3. Skin:  Intact without lesions or rashes. Psych:  anxious.  anxious.     Problems:  Medical Problems Added: 1)  Dx of Chest Tightness-pressure-other  ([id number]) 2)  Dx of Chest Tightness-pressure-other  ([id number]) 3)  Dx of Pre-operative Cardiovascular Examination  (ICD-V72.81)  EKG  Procedure date:  05/31/2009  Findings:      normal sinus rhythm, normal EKG  Impression & Recommendations:  Problem # 1:  SHORTNESS OF BREATH (ICD-786.05) Assessment Deteriorated Per the patient and his girlfriend, his shortness of breath is remarkably worse. His chest tightness is also worse. Despite the negative studies thus far, there is a less than 10% chance we've missed a significant coronary lesion on his stress Myoview. Also obtain a d-dimer even though there was no evidence of pulmonary embolus on this non-dedicated CT scan.  After long discussion, he says that this is not anxiety or his reflux. We decided to proceed with outpatient cardiac catheterization. He has a normal creatinine. I've asked him to push fluids all night to clear the contrast. We'll check a protime, PTT, and a d-dimer today. Indications risks potential benefit has been discussed. He agrees to proceed. His updated medication list for this problem includes:    Bayer Aspirin 325 Mg Tabs (Aspirin) .Marland Kitchen... 1 tab 4-5 times weekly  Orders: T-D-Dimer Fibrin Derivatives Quantitive [id number])  Problem # 2:  DYSPHAGIA UNSPECIFIED (ICD-787.20) Assessment:  Improved  Problem # 3:  CHEST TIGHTNESS-PRESSURE-OTHER ([id number]) Assessment: Deteriorated  Other Orders: T-Protime, Auto ([id number]) T-PTT ([id number])  Patient Instructions: 1)  Your physician recommends that you schedule a follow-up appointment in: AFTER CATH 2)  Your physician recommends that you continue on your current medications as directed. Please refer to the Current Medication list given to you today. 3)  INCREASE FLUID INTAKE

## 2010-03-09 NOTE — Letter (Signed)
Summary: [facility] Instructions  [facility]  [address] [place], [place] [address]   Phone: [phone]  Fax: [phone]       Tyrone Roman    [birth year]    MRN: [id number]        Procedure Day /Date:TUESDAY 03/01/2009     Arrival Time:12:30PM     Procedure Time:1:30PM     Location of Procedure:                    X  [facility] (4th Floor)   PREPARATION FOR COLONOSCOPY WITH MOVIPREP   Starting 5 days prior to your procedure 02/24/2009 do not eat nuts, seeds, popcorn, corn, beans, peas,  salads, or any raw vegetables.  Do not take any fiber supplements (e.g. Metamucil, Citrucel, and Benefiber).  THE DAY BEFORE YOUR PROCEDURE         DATE: 02/28/2009  DAY: MONDAY  1.  Drink clear liquids the entire day-NO SOLID FOOD  2.  Do not drink anything colored red or purple.  Avoid juices with pulp.  No orange juice.  3.  Drink at least 64 oz. (8 glasses) of fluid/clear liquids during the day to prevent dehydration and help the prep work efficiently.  CLEAR LIQUIDS INCLUDE: Water Jello Ice Popsicles Tea (sugar ok, no milk/cream) Powdered fruit flavored drinks Coffee (sugar ok, no milk/cream) Gatorade Juice: apple, white grape, white cranberry  Lemonade Clear bullion, consomm, broth Carbonated beverages (any kind) Strained chicken noodle soup Hard Candy                             4.  In the morning, mix first dose of MoviPrep solution:    Empty 1 Pouch A and 1 Pouch B into the disposable container    Add lukewarm drinking water to the top line of the container. Mix to dissolve    Refrigerate (mixed solution should be used within 24 hrs)  5.  Begin drinking the prep at 5:00 p.m. The MoviPrep container is divided by 4 marks.   Every 15 minutes drink the solution down to the next mark (approximately 8 oz) until the full liter is complete.   6.  Follow completed prep with 16 oz of clear liquid of your choice (Nothing red or purple).  Continue  to drink clear liquids until bedtime.  7.  Before going to bed, mix second dose of MoviPrep solution:    Empty 1 Pouch A and 1 Pouch B into the disposable container    Add lukewarm drinking water to the top line of the container. Mix to dissolve    Refrigerate  THE DAY OF YOUR PROCEDURE      DATE: 03/01/2009 [id number]  Beginning at 8:30a.m. (5 hours before procedure):         1. Every 15 minutes, drink the solution down to the next mark (approx 8 oz) until the full liter is complete.  2. Follow completed prep with 16 oz. of clear liquid of your choice.    3. You may drink clear liquids until 11:30AM (2 HOURS BEFORE PROCEDURE).   MEDICATION INSTRUCTIONS  Unless otherwise instructed, you should take regular prescription medications with a small sip of water   as early as possible the morning of your procedure.         OTHER INSTRUCTIONS  You will need a responsible adult at least 65 years of age to accompany you and drive  you home.   This person must remain in the waiting room during your procedure.  Wear loose fitting clothing that is easily removed.  Leave jewelry and other valuables at home.  However, you may wish to bring a book to read or  an iPod/MP3 player to listen to music as you wait for your procedure to start.  Remove all body piercing jewelry and leave at home.  Total time from sign-in until discharge is approximately 2-3 hours.  You should go home directly after your procedure and rest.  You can resume normal activities the  day after your procedure.  The day of your procedure you should not:   Drive   Make legal decisions   Operate machinery   Drink alcohol   Return to work  You will receive specific instructions about eating, activities and medications before you leave.    The above instructions have been reviewed and explained to me by   _______________________    I fully understand and can verbalize these instructions  _____________________________ Date _________

## 2010-03-09 NOTE — Miscellaneous (Signed)
Summary: Discharge Summary for PT [facility]  Discharge Summary for PT Services/[name]   Imported By: Maryln Gottron 02/15/2010 14:28:11  _____________________________________________________________________  External Attachment:    Type:   Image     Comment:   External Document

## 2010-03-09 NOTE — Assessment & Plan Note (Signed)
Summary: GERD/CH.   History of Present Illness Visit Type: Follow-up Visit Primary GI MD: Melvia Heaps MD [facility] Primary Provider: Gershon Crane MD Chief Complaint: Increase in Solid and liquid dysphagia, hoarseness. Pt also has family hx of colon cancer and would like to schedule a ECL.  History of Present Illness:   [name] is a 65 year old white male with a history of esophageal stricture complaining of recurrent dysphagia to solids and liquids.  He has occasional pyrosis.  He was last dilated in May, 2009.  Family history is pertinent for his father and father's brother who had colon cancer.  Last colonoscopy was  in 2005 where hyperplastic polyps were removed.  He has no lower GI complaints including change of bowel habits, abdominal pain, melena or hematochezia.   GI Review of Systems    Reports abdominal pain, chest pain, dysphagia with liquids, and  dysphagia with solids.     Location of  Abdominal pain: epigastric area.    Denies acid reflux, belching, bloating, heartburn, loss of appetite, nausea, vomiting, vomiting blood, weight loss, and  weight gain.        Denies anal fissure, black tarry stools, change in bowel habit, constipation, diarrhea, diverticulosis, fecal incontinence, heme positive stool, hemorrhoids, irritable bowel syndrome, jaundice, light color stool, liver problems, rectal bleeding, and  rectal pain.    Current Medications (verified): 1)  Bayer Aspirin 325 Mg  Tabs (Aspirin) .Marland Kitchen.. 1 By Mouth Once Daily 2)  Viagra 100 Mg Tabs (Sildenafil Citrate) .Marland Kitchen.. 1 By Mouth As Needed 3)  Omeprazole 40 Mg Cpdr (Omeprazole) .... Once Daily  Allergies (verified): 1)  ! Keflex 2)  ! Biaxin 3)  ! * Lidoderm Patches  Past History:  Past Medical History: Reviewed history from 06/06/2007 and no changes required. Lumbar Disc Disease Benign prostatic hypertrophy, sees Dr. Earlene Plater GERD UTI's skin checks with Dr. Londell Moh  Past Surgical History: Reviewed history from  01/11/2009 and no changes required. Tonsillectomy colonoscopy 10-21-03 per Dr. Arlyce Dice, had benign polyps,  repeat 10  yrs Lt knee surgery 25 years ago 1974 EGD with dilatation per Dr. Arlyce Dice 07-02-07  Family History: Reviewed history from 07/01/2007 and no changes required. Family History of Arthritis Family History of Colon CA 1st degree relative <60 Father Family History of Prostate CA 1st degree relative <50 Family History of Cardiovascular disorder/ Father  Social History: Reviewed history from 07/01/2007 and no changes required. Occupation:Sales Mgr Married Children : girls 4 Former Smoker 1960's Alcohol use-yes Drug use-no Regular exercise-no Occupation:  Writer:  Daily Caffeine Use Patient does not get regular exercise.   Review of Systems       The patient complains of shortness of breath.  The patient denies allergy/sinus, anemia, anxiety-new, arthritis/joint pain, back pain, blood in urine, breast changes/lumps, change in vision, confusion, cough, coughing up blood, depression-new, fainting, fatigue, fever, headaches-new, hearing problems, heart murmur, heart rhythm changes, itching, menstrual pain, muscle pains/cramps, night sweats, nosebleeds, pregnancy symptoms, skin rash, sleeping problems, sore throat, swelling of feet/legs, swollen lymph glands, thirst - excessive , urination - excessive , urination changes/pain, urine leakage, vision changes, and voice change.         All other systems were reviewed and were negative   Vital Signs:  Patient profile:   65 year old male Height:      74 inches Weight:      232.13 pounds BMI:     29.91 Pulse rate:   88 / minute Pulse rhythm:  regular BP sitting:   124 / 78  (right arm) Cuff size:   regular  Vitals Entered By: Christie Nottingham CMA Duncan Dull) (February 11, 2009 3:02 PM)  Physical Exam  Additional Exam:  He is a healthy-appearing male  skin: anicteric HEENT: normocephalic; PEERLA; no nasal or  pharyngeal abnormalities neck: supple nodes: no cervical lymphadenopathy chest: clear to ausculatation and percussion heart: no murmurs, gallops, or rubs abd: soft, nontender; BS normoactive; no abdominal masses, tenderness, organomegaly rectal: deferred ext: no cynanosis, clubbing, edema skeletal: no deformities neuro: oriented x 3; no focal abnormalities    Impression & Recommendations:  Problem # 1:  DYSPHAGIA UNSPECIFIED (ICD-787.20) Assessment Deteriorated  Plan upper endoscopy with dilatation for a recurrent symptomatic stricture  Orders: EGD (EGD)  Problem # 2:  FAMILY HISTORY OF COLON CA 1ST DEGREE RELATIVE <60 (ICD-V16.0)  Plan followup colonoscopy  Orders: Colonoscopy (Colon)  Problem # 3:  GERD (ICD-530.81) Assessment: Comment Only  Patient Instructions: 1)  Colonoscopy and Flexible Sigmoidoscopy brochure given.  2)  Conscious Sedation brochure given.  3)  Upper Endoscopy with Dilatation brochure given.  4)  Your EGD is scheduled for 02/15/2008 5)  Your colonoscopy is scheduled for 03/01/2009 at 1:30pm 6)  You can pick up your MoviPrep today 7)  The medication list was reviewed and reconciled.  All changed / newly prescribed medications were explained.  A complete medication list was provided to the patient / caregiver. Prescriptions: MOVIPREP 100 GM  SOLR (PEG-KCL-NACL-NASULF-NA ASC-C) As per prep instructions.  #1 x 0   Entered by:   Merri Ray CMA (AAMA)   Authorized by:   Louis Meckel MD   Signed by:   Merri Ray CMA (AAMA) on 02/11/2009   Method used:   Electronically to        [facility]  [phone]* (retail)       [address]       [place], [place]  [address]       Ph: [phone] or [phone]       Fax: [phone]   RxID:   [id number]

## 2010-03-09 NOTE — Miscellaneous (Signed)
Summary: Orders Update  Clinical Lists Changes  Problems: Added new problem of SYNCOPE (ICD-780.2) Orders: Added new Test order of Carotid Duplex (Carotid Duplex) - Signed 

## 2010-03-09 NOTE — Miscellaneous (Signed)
Summary: [facility] Cardiac Progress Note   [facility] Progress Note   Imported By: Roderic Ovens 07/13/2009 14:33:59  _____________________________________________________________________  External Attachment:    Type:   Image     Comment:   External Document

## 2010-03-09 NOTE — Miscellaneous (Signed)
Summary: [facility] Cardiac Physician Order/Treatment Plan   [facility] Physician Order/Treatment Plan   Imported By: Roderic Ovens 07/13/2009 15:46:37  _____________________________________________________________________  External Attachment:    Type:   Image     Comment:   External Document

## 2010-03-09 NOTE — Miscellaneous (Signed)
  Clinical Lists Changes  Orders: Added new Service order of EKG w/ Interpretation ([id number]) - Signed 

## 2010-03-09 NOTE — Assessment & Plan Note (Signed)
Summary: cpx/cdw   Vital Signs:  Patient profile:   65 year old male Height:      73 inches Weight:      232 pounds BMI:     30.72 Temp:     97.4 degrees F oral Pulse rate:   78 / minute BP sitting:   118 / 88  (left arm) Cuff size:   large  Vitals Entered By: Alfred Levins, CMA (January 11, 2009 2:11 PM) CC: cpx   History of Present Illness: 65 yr old male for cpx. He has some definite concerns to discuss today. About one month ago he developed some burning type epigastric and chest pains, some chest fullness, some SOB, some nausea without vomitiing, and some sweats. These can occur at rest but they are more prominent when he is exerting himself. He can get these sensations at night also, but they do not wake him up from sleep. He does have trouble swallowing at times. He has known GERD and a small hiatal hernia, and during his last EGD last year he had an esophageal stricture that was dilated. His BMs are regular. He had some benign polyps taken out during a colonoscopy 5 years ago, and a repeat was reccomended for 10 years. However his father was diagnosed with colon cancer at age 47, and he is very uncomfortable with waiting that long. I tend to agree with him about this. He is due to see Dr. Earlene Plater next week for a genital and prostate exam.  Current Medications (verified): 1)  Bayer Aspirin 325 Mg  Tabs (Aspirin) .Marland Kitchen.. 1 By Mouth Once Daily 2)  Viagra 100 Mg Tabs (Sildenafil Citrate) .Marland Kitchen.. 1 By Mouth As Needed 3)  Omeprazole 20 Mg Cpdr (Omeprazole) .... One By Mouth Daily  Allergies (verified): 1)  ! Keflex 2)  ! Biaxin 3)  ! * Lidoderm Patches  Past History:  Past Medical History: Reviewed history from 06/06/2007 and no changes required. Lumbar Disc Disease Benign prostatic hypertrophy, sees Dr. Earlene Plater GERD UTI's skin checks with Dr. Londell Moh  Past Surgical History: Tonsillectomy colonoscopy 10-21-03 per Dr. Arlyce Dice, had benign polyps,  repeat 10  yrs Lt knee surgery 25  years ago 1974 EGD with dilatation per Dr. Arlyce Dice 07-02-07  Review of Systems  The patient denies anorexia, fever, weight loss, weight gain, vision loss, decreased hearing, hoarseness, syncope, peripheral edema, prolonged cough, headaches, hemoptysis, abdominal pain, melena, hematochezia, hematuria, incontinence, genital sores, muscle weakness, suspicious skin lesions, transient blindness, difficulty walking, depression, unusual weight change, abnormal bleeding, enlarged lymph nodes, angioedema, breast masses, and testicular masses.    Physical Exam  General:  Well-developed,well-nourished,in no acute distress; alert,appropriate and cooperative throughout examination Head:  Normocephalic and atraumatic without obvious abnormalities. No apparent alopecia or balding. Eyes:  No corneal or conjunctival inflammation noted. EOMI. Perrla. Funduscopic exam benign, without hemorrhages, exudates or papilledema. Vision grossly normal. Ears:  External ear exam shows no significant lesions or deformities.  Otoscopic examination reveals clear canals, tympanic membranes are intact bilaterally without bulging, retraction, inflammation or discharge. Hearing is grossly normal bilaterally. Nose:  External nasal examination shows no deformity or inflammation. Nasal mucosa are pink and moist without lesions or exudates. Mouth:  Oral mucosa and oropharynx without lesions or exudates.  Teeth in good repair. Neck:  No deformities, masses, or tenderness noted. Chest Wall:  No deformities, masses, tenderness or gynecomastia noted. Lungs:  Normal respiratory effort, chest expands symmetrically. Lungs are clear to auscultation, no crackles or wheezes. Heart:  Normal  rate and regular rhythm. S1 and S2 normal without gallop, murmur, click, rub or other extra sounds. EKG is normal Abdomen:  Bowel sounds positive,abdomen soft and non-tender without masses, organomegaly or hernias noted. Msk:  No deformity or scoliosis noted of  thoracic or lumbar spine.   Pulses:  R and L carotid,radial,femoral,dorsalis pedis and posterior tibial pulses are full and equal bilaterally Extremities:  No clubbing, cyanosis, edema, or deformity noted with normal full range of motion of all joints.   Neurologic:  No cranial nerve deficits noted. Station and gait are normal. Plantar reflexes are down-going bilaterally. DTRs are symmetrical throughout. Sensory, motor and coordinative functions appear intact. Skin:  Intact without suspicious lesions or rashes Cervical Nodes:  No lymphadenopathy noted Axillary Nodes:  No palpable lymphadenopathy Inguinal Nodes:  No significant adenopathy Psych:  Cognition and judgment appear intact. Alert and cooperative with normal attention span and concentration. No apparent delusions, illusions, hallucinations   Impression & Recommendations:  Problem # 1:  WELL ADULT EXAM (ICD-V70.0)  Orders: EKG w/ Interpretation (93000)  Complete Medication List: 1)  Bayer Aspirin 325 Mg Tabs (Aspirin) .Marland Kitchen.. 1 by mouth once daily 2)  Viagra 100 Mg Tabs (Sildenafil citrate) .Marland Kitchen.. 1 by mouth as needed 3)  Omeprazole 40 Mg Cpdr (Omeprazole) .... Once daily  Other Orders: Pneumococcal Vaccine (55732) Admin 1st Vaccine (20254) Flu Vaccine 61yrs + (27062) Administration Flu vaccine - MCR (B7628) Gastroenterology Referral (GI) Cardiology Referral (Cardiology)  Patient Instructions: 1)  This chest pain needs to be worked up of course, and we need to rule out angina as a cause. I think it is likely due to GI causes rather than cardiac, but we will set up a stress test soon anyway. He seems to be having reflux related issues, so we will increase Omeprazole to 40 mg a day. He needs repeat upper endoscopy soon and a possible dilatation, so we will have him meet with Dr. Arlyce Dice again soon. I agree with Jonny Ruiz that he should have another colonoscopy now at the 5 year interval, considering his personal hx of polyps and his family  hx of colon cancer in his father. It seems that upper and lower endoscopies on the same day would make sense to do.  Prescriptions: OMEPRAZOLE 40 MG CPDR (OMEPRAZOLE) once daily  #30 x 11   Entered and Authorized by:   Nelwyn Salisbury MD   Signed by:   Nelwyn Salisbury MD on 01/11/2009   Method used:   Electronically to        [facility] # [phone]* (retail)       [address]       [place], [place]  [address]       Ph: [phone]       Fax: [phone]   RxID:   [id number] VIAGRA 100 MG TABS (SILDENAFIL CITRATE) 1 by mouth as needed  #10 x 11   Entered and Authorized by:   Nelwyn Salisbury MD   Signed by:   Nelwyn Salisbury MD on 01/11/2009   Method used:   Electronically to        [facility] # [address]* (retail)       [address]       [place], [place]  [address]       Ph: [phone]       Fax: [phone]   RxID:   [id number]   Preventive Care Screening  Last Tetanus Booster:    Date:  02/05/2006    Results:  Historical   Colonoscopy:    Date:  10/21/2003    Results:  Diverticulosis     Immunization History:  Tetanus/Td Immunization History:    Tetanus/Td:  historical (02/05/2006)  Immunizations Administered:  Pneumonia Vaccine:    Vaccine Type: Pneumovax (Medicare)    Site: right deltoid    Mfr: Merck    Dose: 0.5 ml    Route: IM    Given by: Alfred Levins, CMA    Exp. Date: 01/20/2010    Lot #: [id number] Flu Vaccine Consent Questions     Do you have a history of severe allergic reactions to this vaccine? no    Any prior history of allergic reactions to egg and/or gelatin? no    Do you have a sensitivity to the preservative Thimersol? no    Do you have a past history of Guillan-Barre Syndrome? no    Do you currently have an acute febrile illness? no    Have you ever had a severe reaction to latex? no    Vaccine information given and explained to patient? yes    Are you currently pregnant? no    Lot Number:[id number]   Exp  Date:08/04/2009   Site Given  Left Deltoid [id number]    Lot #: [id number]   .lbmedflu

## 2010-03-09 NOTE — Miscellaneous (Signed)
Summary: Initial Summary for PT Services/[facility]  Initial Summary for PT Services/[facility]   Imported By: Maryln Gottron 11/11/2009 15:24:05  _____________________________________________________________________  External Attachment:    Type:   Image     Comment:   External Document

## 2010-03-09 NOTE — Letter (Signed)
Summary: Recall Colonoscopy Date Change Letter  [facility]  [address] [place], [place] [address]   Phone: [phone]  Fax: [phone]      October 14, 2008 MRN: [id number]   Tyrone Roman [address] [place], [place]  [address]   Dear Mr. BURLING,   Previously you were recommended to have a repeat colonoscopy around this time. Your chart was recently reviewed by Dr.Kaplan of [facility]. Follow up colonoscopy is now recommended in 10-2013. This revised recommendation is based on current, nationally recognized guidelines for colorectal cancer screening and polyp surveillance. These guidelines are endorsed by the American Cancer Society, [employer] on Colorectal Cancer as well as numerous other major medical organizations.  Please understand that our recommendation assumes that you do not have any new symptoms such as bleeding, a change in bowel habits, anemia, or significant abdominal discomfort. If you do have any concerning GI symptoms or want to discuss the guideline recommendations, please call to arrange an office visit at your earliest convenience. Otherwise we will keep you in our reminder system and contact you 1-2 months prior to the date listed above to schedule your next colonoscopy.  Thank you,  Barbette Hair. Arlyce Dice, M.D.  [facility]astro[facility] Division [phone]

## 2010-03-09 NOTE — Assessment & Plan Note (Signed)
Summary: fu on prostate/njr   Vital Signs:  Patient profile:   65 year old male Weight:      231 pounds O2 Sat:      90 % Pulse rate:   50 / minute Pulse rhythm:   regular BP sitting:   138 / 86  Vitals Entered By: Lynann Beaver CMA (May 06, 2009 11:33 AM) CC: recheck Is Patient Diabetic? No Pain Assessment Patient in pain? no        History of Present Illness: Here to discuss his concerns about possible exposure to HPV. He recently learned that his girlfriend has HPV when her Pap smear returned as abnormal. he has never had any rashes or lesions on his genitals to his knowledge, but he is very concerned that he may have it.   Current Medications (verified): 1)  Bayer Aspirin 325 Mg  Tabs (Aspirin) .Marland Kitchen.. 1 By Mouth Once Daily 2)  Viagra 100 Mg Tabs (Sildenafil Citrate) .Marland Kitchen.. 1 By Mouth As Needed 3)  Omeprazole 40 Mg Cpdr (Omeprazole) .... Once Daily  Allergies (verified): 1)  ! Keflex 2)  ! Biaxin 3)  ! * Lidoderm Patches  Past History:  Past Medical History: Reviewed history from 06/06/2007 and no changes required. Lumbar Disc Disease Benign prostatic hypertrophy, sees Dr. Earlene Plater GERD UTI's skin checks with Dr. Londell Moh  Review of Systems  The patient denies anorexia, fever, weight loss, weight gain, vision loss, decreased hearing, hoarseness, chest pain, syncope, dyspnea on exertion, peripheral edema, prolonged cough, headaches, hemoptysis, abdominal pain, melena, hematochezia, severe indigestion/heartburn, hematuria, incontinence, genital sores, muscle weakness, suspicious skin lesions, transient blindness, difficulty walking, depression, unusual weight change, abnormal bleeding, enlarged lymph nodes, angioedema, breast masses, and testicular masses.    Physical Exam  General:  Well-developed,well-nourished,in no acute distress; alert,appropriate and cooperative throughout examination Genitalia:  Testes bilaterally descended without nodularity, tenderness or  masses. No scrotal masses or lesions. No penis lesions or urethral discharge.   Impression & Recommendations:  Problem # 1:  CONTACT WITH OR EXPOSURE TO VENEREAL DISEASES (ICD-V01.6)  Complete Medication List: 1)  Bayer Aspirin 325 Mg Tabs (Aspirin) .Marland Kitchen.. 1 by mouth once daily 2)  Viagra 100 Mg Tabs (Sildenafil citrate) .Marland Kitchen.. 1 by mouth as needed 3)  Omeprazole 40 Mg Cpdr (Omeprazole) .... Once daily 4)  Aldara 5 % Crea (Imiquimod) .... Apply as directed  Other Orders: UA Dipstick w/o Micro (automated)  (81003)  Patient Instructions: 1)  Even though he has no signs of HPV infection, he would like to prophylactically treat himself. We will use Aldara for a period of 3-6 months. 2)  Please schedule a follow-up appointment as needed .  Prescriptions: ALDARA 5 % CREA (IMIQUIMOD) apply as directed  #30 x 5   Entered and Authorized by:   Nelwyn Salisbury MD   Signed by:   Nelwyn Salisbury MD on 05/06/2009   Method used:   Electronically to        [facility] # [phone]* (retail)       [address]       [place], [place]  [address]       Ph: [phone]       Fax: [phone]   RxID:   [id number]   Laboratory Results   Urine Tests  Date/Time Recieved: May 06, 2009 11:45 AM  Date/Time Reported: May 06, 2009 11:45 AM   Routine Urinalysis   Color: yellow Appearance: Clear Glucose: negative   (Normal Range: Negative)  Bilirubin: negative   (Normal Range: Negative) Ketone: negative   (Normal Range: Negative) Spec. Gravity: 1.020   (Normal Range: 1.003-1.035) Blood: negative   (Normal Range: Negative) pH: 5.5   (Normal Range: 5.0-8.0) Protein: negative   (Normal Range: Negative) Urobilinogen: 1.0   (Normal Range: 0-1) Nitrite: negative   (Normal Range: Negative) Leukocyte Esterace: negative   (Normal Range: Negative)    Comments: Wynona Canes, CMA  May 06, 2009 11:45 AM

## 2010-03-09 NOTE — Letter (Signed)
Summary: Cardiac Catheterization Instructions- [facility]  [facility], Main Office  [address]   [place], [place] [address]   Phone: [phone]  Fax: [phone]     06/02/2009 MRN: [id number]  Tyrone Roman [address] [place], [place]  [address]  Dear Mr. [name],   You are scheduled for a Cardiac Catheterization on ____4/29/11_____ with Dr.STUCKEY______________  Please arrive to the 1st floor of the Heart and Vascular Center at [facility] at 7:00_____ am / pm on the day of your procedure. Please do not arrive before 6:30 a.m. Call the Heart and Vascular Center at [phone] if you are unable to make your appointmnet. The Code to get into the parking garage under the building is__0001______. Take the elevators to the 1st floor. You must have someone to drive you home. Someone must be with you for the first 24 hours after you arrive home. Please wear clothes that are easy to get on and off and wear slip-on shoes. Do not eat or drink after midnight except water with your medications that morning. Bring all your medications and current insurance cards with you.  ___ DO NOT take these medications before your procedure: ________________________________________________________________  _X__ Make sure you take your aspirin.  ___ You may take ALL of your medications with water that morning. ________________________________________________________________________________________________________________________________  ___ DO NOT take ANY medications before your procedure.  ___ Pre-med instructions:  ________________________________________________________________________________________________________________________________  The usual length of stay after your procedure is 2 to 3 hours. This can vary.  If you have any questions, please call the office at the number listed above.   Scherrie Bateman, LPN

## 2010-03-09 NOTE — Procedures (Signed)
Summary: Upper Endoscopy  Patient: Tyrone Roman Note: All result statuses are Final unless otherwise noted.  Tests: (1) Upper Endoscopy (EGD)   EGD Upper Endoscopy       DONE     [facility]     [address]     [place], [place]  [address]           ENDOSCOPY PROCEDURE REPORT           PATIENT:  Tyrone Roman, Tyrone Roman  MR#:  [id number]     BIRTHDATE:  [birth year], 63 yrs. old  GENDER:  male           ENDOSCOPIST:  Barbette Hair. Arlyce Dice, MD     Referred by:           PROCEDURE DATE:  02/14/2009     PROCEDURE:  EGD with biopsy, Maloney Dilation of Esophagus     ASA CLASS:  Class II     INDICATIONS:  dysphagia           MEDICATIONS:   Fentanyl 75 mcg IV, Versed 9 mg IV, Benadryl 12.5     mg IV, glycopyrrolate (Robinal) 0.2 mg IV, 0.6cc simethancone 0.6     cc PO     TOPICAL ANESTHETIC:  Exactacain Spray           DESCRIPTION OF PROCEDURE:   After the risks benefits and     alternatives of the procedure were thoroughly explained, informed     consent was obtained.  The LB [id number] endoscope was     introduced through the mouth and advanced to the third portion of     the duodenum, without limitations.  The instrument was slowly     withdrawn as the mucosa was fully examined.     <<PROCEDUREIMAGES>>           A stricture was found at the gastroesophageal junction (see     image1). Early esophageal stricture Dilation with maloney dilator     18mm Mild resistance; no heme  irregular Z-line at the     gastroesophageal junction (see image2 and image3). Bxs taken to     r/o Barrett's esophagus  Otherwise the examination was normal.     Retroflexed views revealed no abnormalities.    The scope was then     withdrawn from the patient and the procedure completed.           COMPLICATIONS:  None           ENDOSCOPIC IMPRESSION:     1) Stricture at the gastroesophageal junction - s/p dilitation     2) Irregular Z-line at the gastroesophageal junction     3) Otherwise normal  examination     RECOMMENDATIONS:     1) await biopsy results     2) continue PPI     3) dilatations PRN           REPEAT EXAM:  In You will receive a letter from Dr. Arlyce Dice in 1-2     weeks, after reviewing the final pathology, with followup     recommendations. for.           ______________________________     Barbette Hair. Arlyce Dice, MD           CC:  Nelwyn Salisbury, MD           n.     Rosalie DoctorBarbette Hair. Kaplan at 02/14/2009 04:34 PM  Tyrone Roman, Tyrone Roman, [id number]  Note: An exclamation mark (!) indicates a result that was not dispersed into the flowsheet. Document Creation Date: 02/14/2009 4:34 PM _______________________________________________________________________  (1) Order result status: Final Collection or observation date-time: 02/14/2009 16:29 Requested date-time:  Receipt date-time:  Reported date-time:  Referring Physician:   Ordering Physician: Melvia Heaps [phone]) Specimen Source:  Source: Launa Grill Order Number: [id number] Lab site:   Appended Document: Upper Endoscopy     Procedures Next Due Date:    EGD: 02/2010  Appended Document: Upper Endoscopy     Procedures Next Due Date:    EGD: 02/2010

## 2010-03-09 NOTE — Progress Notes (Signed)
Summary: rtc  Phone Note Call from Patient Call back at Home Phone [phone]   Caller: Patient Call For: Nelwyn Salisbury MD Summary of Call: pt is returning gina call Initial call taken by: Heron Sabins,  December 23, 2009 9:50 AM  Follow-up for Phone Call        pt notifed of lab results.  Follow-up by: Pura Spice, RN,  December 23, 2009 10:09 AM

## 2010-03-09 NOTE — Progress Notes (Signed)
Summary: refill,   Phone Note Call from Patient Call back at Home Phone [phone]   Caller: Patient Call For: dr fry Summary of Call: pt has poison oak again pt would [facility] [phone] Initial call taken by: Heron Sabins,  Jul 04, 2007 10:12 AM  Follow-up for Phone Call        Sterapred ds for 12 days Follow-up by: Nelwyn Salisbury MD,  Jul 04, 2007 4:38 PM    New/Updated Medications: STERAPRED DS 12 DAY 10 MG  TABS (PREDNISONE) 1 by mouth once daily   Prescriptions: STERAPRED DS 12 DAY 10 MG  TABS (PREDNISONE) 1 by mouth once daily  #12 days x 0   Entered by:   Alfred Levins, CMA   Authorized by:   Nelwyn Salisbury MD   Signed by:   Alfred Levins, CMA on 07/04/2007   Method used:   Electronically sent to ...       [facility]       [address]       [place], [place]  [address]       Ph: [phone]       Fax: [phone]   RxID:   [id number]

## 2010-03-09 NOTE — Assessment & Plan Note (Signed)
Summary: episodes of sob?/dm   Vital Signs:  Patient profile:   65 year old male Weight:      218 pounds BMI:     28.09 O2 Sat:      98 % Temp:     98 degrees F oral Pulse rate:   78 / minute Pulse rhythm:   regular BP sitting:   88 / 68  (left arm) Cuff size:   regular  Vitals Entered By: Raechel Ache, RN (May 31, 2009 10:50 AM) CC: C/o recent SOB, tired, slightly light-headed; can't walk up steps without stopping.   History of Present Illness: Here for worsening tightness in the chest, burning in the chest, and SOB. He mentioned this to me last December at his cpx, and we sent him for a stress test which returned as normal. He had his esophagus dilated in January. These symptoms have gotten worse over the past 2 weeks. They are worst on exertion, such as when he goes up steps, but he has them at rest as well. No chest pain or nausea or sweats. He also describes dizziness or lightheadedness at times.   Allergies: 1)  ! Keflex 2)  ! Biaxin 3)  ! * Lidoderm Patches  Past History:  Past Medical History: Lumbar Disc Disease Benign prostatic hypertrophy, sees Dr. Earlene Plater GERD with Barretts esophagus, sees Dr. Arlyce Dice UTI's skin checks with Dr. Londell Moh normal cardiac stress test 01-17-09  Past Surgical History: Tonsillectomy colonoscopy 03-01-09 per Dr. Arlyce Dice, had benign polyps,  repeat 5 yrs Lt knee surgery 25 years ago 1974 EGD with dilatation per Dr. Arlyce Dice 02-17-09, showed Barretts esophagus  Family History: Reviewed history from 07/01/2007 and no changes required. Family History of Arthritis Family History of Colon CA 1st degree relative <60 Father Family History of Prostate CA 1st degree relative <50 Family History of Cardiovascular disorder/ Father  Social History: Reviewed history from 07/01/2007 and no changes required. Occupation:Sales Mgr Married Children : girls 4 Former Smoker 1960's Alcohol use-yes Drug use-no Regular exercise-no Travel HistorySales  Mgr:  Daily Caffeine Use Patient does not get regular exercise.   Review of Systems  The patient denies anorexia, fever, weight loss, weight gain, vision loss, decreased hearing, hoarseness, chest pain, syncope, peripheral edema, prolonged cough, headaches, hemoptysis, abdominal pain, melena, hematochezia, severe indigestion/heartburn, hematuria, incontinence, genital sores, muscle weakness, suspicious skin lesions, transient blindness, difficulty walking, depression, unusual weight change, abnormal bleeding, enlarged lymph nodes, angioedema, breast masses, and testicular masses.    Physical Exam  General:  Well-developed,well-nourished,in no acute distress; alert,appropriate and cooperative throughout examination Head:  Normocephalic and atraumatic without obvious abnormalities. No apparent alopecia or balding. Eyes:  No corneal or conjunctival inflammation noted. EOMI. Perrla. Funduscopic exam benign, without hemorrhages, exudates or papilledema. Vision grossly normal. Ears:  External ear exam shows no significant lesions or deformities.  Otoscopic examination reveals clear canals, tympanic membranes are intact bilaterally without bulging, retraction, inflammation or discharge. Hearing is grossly normal bilaterally. Nose:  External nasal examination shows no deformity or inflammation. Nasal mucosa are pink and moist without lesions or exudates. Mouth:  Oral mucosa and oropharynx without lesions or exudates.  Teeth in good repair. Neck:  No deformities, masses, or tenderness noted. Chest Wall:  No deformities, masses, tenderness or gynecomastia noted. Lungs:  Normal respiratory effort, chest expands symmetrically. Lungs are clear to auscultation, no crackles or wheezes. Heart:  Normal rate and regular rhythm. S1 and S2 normal without gallop, murmur, click, rub or other extra sounds. EKG normal.  Abdomen:  Bowel sounds positive,abdomen soft and non-tender without masses, organomegaly or hernias  noted. Neurologic:  No cranial nerve deficits noted. Station and gait are normal. Plantar reflexes are down-going bilaterally. DTRs are symmetrical throughout. Sensory, motor and coordinative functions appear intact.   Impression & Recommendations:  Problem # 1:  SHORTNESS OF BREATH (ICD-786.05)  Orders: Venipuncture (57846) TLB-BMP (Basic Metabolic Panel-BMET) (80048-METABOL) TLB-CBC Platelet - w/Differential (85025-CBCD) TLB-Hepatic/Liver Function Pnl (80076-HEPATIC) TLB-TSH (Thyroid Stimulating Hormone) (84443-TSH) TLB-B12, Serum-Total ONLY (96295-M84) EKG w/ Interpretation (93000) T-2 View CXR (71020TC)  Complete Medication List: 1)  Bayer Aspirin 325 Mg Tabs (Aspirin) .Marland Kitchen.. 1 by mouth once daily 2)  Viagra 100 Mg Tabs (Sildenafil citrate) .Marland Kitchen.. 1 by mouth as needed 3)  Omeprazole 40 Mg Cpdr (Omeprazole) .... Once daily 4)  Aldara 5 % Crea (Imiquimod) .... Apply as directed 5)  Tamsulosin Hcl 0.4 Mg Caps (Tamsulosin hcl) .Marland Kitchen.. 1 once daily  Patient Instructions: 1)  i am not sure what to make of these symptoms. we will get labs today to rule out anemia or other metabolic issues. Will send him for a CXR today, as he has not had one for years. if these are unrevealing, we may refer him to Cardiology to evaluate.

## 2010-03-09 NOTE — Progress Notes (Signed)
Summary: pt called to get xray and lab results  Phone Note Call from Patient Call back at Home Phone [phone]   Caller: Patient Summary of Call: Pt called to get xray and lab results. Pls call asap.  Initial call taken by: Lucy Antigua,  June 01, 2009 10:02 AM  Follow-up for Phone Call        pt cb Follow-up by: Heron Sabins,  June 01, 2009 12:11 PM  Additional Follow-up for Phone Call Additional follow up Details #1::        see reports. Refer him to Cardiology for dyspnea on exertion Additional Follow-up by: Nelwyn Salisbury MD,  June 01, 2009 1:17 PM    Additional Follow-up for Phone Call Additional follow up Details #2::    called. Follow-up by: Raechel Ache, RN,  June 01, 2009 1:22 PM   Appended Document: Orders Update    Clinical Lists Changes  Orders: Added new Referral order of Cardiology Referral (Cardiology) - Signed Added new Referral order of Radiology Referral (Radiology) - Signed

## 2010-03-09 NOTE — Letter (Signed)
Summary: Patient Notice-[facility]  [address] [place], [place] [address]   Phone: [phone]  Fax: [phone]        February 17, 2009 MRN: [id number]    Tyrone Roman [address] [place], [place]  [address]    Dear Mr. LINDEMAN,  I am pleased to inform you that the biopsies taken during your recent endoscopic examination did not show any evidence of cancer upon pathologic examination.  However, your biopsies indicate you have a condition known as Barrett's esophagus. While not cancer, it is pre-cancerous (can progress to cancer) and needs to be monitored with repeat endoscopic examination and biopsies.  Fortunately, it is quite rare that this develops into cancer, but careful monitoring of the condition along with taking your medication as prescribed is important in reducing the risk of developing cancer.  It is my recommendation that you have a repeat upper gastrointestinal endoscopic examination in 1_ years.  Additional information/recommendations:  __Please call [phone] to schedule a return visit to further      evaluate your condition.  _x_Continue with treatment plan as outlined the day of your exam.  Please call us if you have or develop heartburn, reflux symptoms, any swallowing problems, or if you have questions about your condition that have not been fully answered at this time.  Sincerely,  Louis Meckel MD  This letter has been electronically signed by your physician.  Appended Document: Patient Notice-[facility] Letter mailed 1.14.11

## 2010-03-09 NOTE — Assessment & Plan Note (Signed)
Summary: STAPLE REMOVAL/RCD   Vital Signs:  Patient profile:   65 year old male BP sitting:   112 / 90  (left arm) Cuff size:   large  Vitals Entered By: Alfred Levins, CMA (February 28, 2009 4:28 PM)  History of Present Illness: Here for a staple removal. On 02-22-09 at home he slipped on some ice and fell, striking the back of his head on some steps. He did lose consciousness briefly. when he woke up he had a HA, was bleeding, and felt faint so he called EMS. They tok him to the ER, where an exam was negative except for a laceration on the occipital scalp. A CT of his head was clear. A single staple was placed,and he was sent home. he went back to work the next day. He still gets a bit lightheaded and nasueated at times, and he has a mild HA. No neurologic deficits.   Allergies: 1)  ! Keflex 2)  ! Biaxin 3)  ! * Lidoderm Patches  Past History:  Past Medical History: Reviewed history from 06/06/2007 and no changes required. Lumbar Disc Disease Benign prostatic hypertrophy, sees Dr. Earlene Plater GERD UTI's skin checks with Dr. Londell Moh  Past Surgical History: Reviewed history from 01/11/2009 and no changes required. Tonsillectomy colonoscopy 10-21-03 per Dr. Arlyce Dice, had benign polyps,  repeat 10  yrs Lt knee surgery 25 years ago 1974 EGD with dilatation per Dr. Arlyce Dice 07-02-07  Review of Systems  The patient denies anorexia, fever, weight loss, weight gain, vision loss, decreased hearing, hoarseness, chest pain, syncope, dyspnea on exertion, peripheral edema, prolonged cough, hemoptysis, abdominal pain, melena, hematochezia, severe indigestion/heartburn, hematuria, incontinence, genital sores, muscle weakness, suspicious skin lesions, transient blindness, difficulty walking, depression, unusual weight change, abnormal bleeding, enlarged lymph nodes, angioedema, breast masses, and testicular masses.    Physical Exam  General:  Well-developed,well-nourished,in no acute distress;  alert,appropriate and cooperative throughout examination Head:  clear except a small healing laceration on the occiput with a single staple in place Eyes:  No corneal or conjunctival inflammation noted. EOMI. Perrla. Funduscopic exam benign, without hemorrhages, exudates or papilledema. Vision grossly normal. Ears:  External ear exam shows no significant lesions or deformities.  Otoscopic examination reveals clear canals, tympanic membranes are intact bilaterally without bulging, retraction, inflammation or discharge. Hearing is grossly normal bilaterally. Nose:  External nasal examination shows no deformity or inflammation. Nasal mucosa are pink and moist without lesions or exudates. Mouth:  Oral mucosa and oropharynx without lesions or exudates.  Teeth in good repair. Neck:  No deformities, masses, or tenderness noted. Neurologic:  No cranial nerve deficits noted. Station and gait are normal. Plantar reflexes are down-going bilaterally. DTRs are symmetrical throughout. Sensory, motor and coordinative functions appear intact.   Impression & Recommendations:  Problem # 1:  CONCUSSION WITH LOC OF 30 MINUTES OR LESS (ICD-850.11)  Problem # 2:  LACERATION, SCALP (ICD-873.0)  Complete Medication List: 1)  Bayer Aspirin 325 Mg Tabs (Aspirin) .Marland Kitchen.. 1 by mouth once daily 2)  Viagra 100 Mg Tabs (Sildenafil citrate) .Marland Kitchen.. 1 by mouth as needed 3)  Omeprazole 40 Mg Cpdr (Omeprazole) .... Once daily 4)  Moviprep 100 Gm Solr (Peg-kcl-nacl-nasulf-na asc-c) .... As per prep instructions.  Patient Instructions: 1)  The scalp is healing well, and we removed the staple easily. As far as the concussion goes, he will take it easy and follow up as needed .

## 2010-03-09 NOTE — Letter (Signed)
Summary: [facility]  [facility]   Imported By: Lennie Odor 07/21/2009 15:23:43  _____________________________________________________________________  External Attachment:    Type:   Image     Comment:   External Document

## 2010-03-09 NOTE — Progress Notes (Signed)
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 This is a requirement for his work. Pt also says that he has been having shortness of breath and may need xray. Please advise.  Initial call taken by: Lucy Antigua,  January 05, 2009 4:39 PM  Follow-up for Phone Call        Phone Call Completed, Appt Scheduled Follow-up by: Alfred Levins, CMA,  January 05, 2009 5:14 PM
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
## 2010-03-09 NOTE — Consult Note (Signed)
Summary: [facility]   Imported By: Maryln Gottron 03/02/2010 14:31:17  _____________________________________________________________________  External Attachment:    Type:   Image     Comment:   External Document

## 2010-03-09 NOTE — Procedures (Signed)
Summary: Colonoscopy  Patient: Tyrone Roman Note: All result statuses are Final unless otherwise noted.  Tests: (1) Colonoscopy (COL)   COL Colonoscopy           DONE     [facility]     [address]     [place], [place]  [address]           COLONOSCOPY PROCEDURE REPORT           PATIENT:  Tyrone Roman, Tyrone Roman  MR#:  [id number]     BIRTHDATE:  1946-01-09, 63 yrs. old  GENDER:  male           ENDOSCOPIST:  Barbette Hair. Arlyce Dice, MD     Referred by:           PROCEDURE DATE:  03/01/2009     PROCEDURE:  Colonoscopy with snare polypectomy     ASA CLASS:  Class II     INDICATIONS:  Elevated Risk Screening father with colon Ca           MEDICATIONS:   Fentanyl 75 mcg IV, Versed 8 mg IV, Benadryl 25 mg     IV           DESCRIPTION OF PROCEDURE:   After the risks benefits and     alternatives of the procedure were thoroughly explained, informed     consent was obtained.  Digital rectal exam was performed and     revealed perianal skin tags.   The LB [id number] endoscope     was introduced through the anus and advanced to the cecum, which     was identified by both the appendix and ileocecal valve, without     limitations.  The quality of the prep was excellent, using     MoviPrep.  The instrument was then slowly withdrawn as the colon     was fully examined.     <<PROCEDUREIMAGES>>           FINDINGS:  A sessile polyp was found in the cecum. It was 6 mm in     size. Polyp was snared without cautery. Retrieval was successful     (see image2). snare polyp  A sessile polyp was found in the     descending colon. It was 4 mm in size. Polyp was snared without     cautery. Retrieval was successful (see image9). snare polyp     Moderate diverticulosis was found (see image1).  This was otherwise     a normal examination of the colon (see image3, image4, image6,     image8, image10, image14, and image15).   Retroflexed views in the     rectum revealed no abnormalities.    The scope  was then withdrawn     from the patient and the procedure completed.           COMPLICATIONS:  None           ENDOSCOPIC IMPRESSION:     1) 6 mm sessile polyp in the cecum     2) 4 mm sessile polyp in the descending colon     3) Moderate diverticulosis     4) Otherwise normal examination     RECOMMENDATIONS:     1) Given your significant family history of colon cancer, you     should have a repeat colonoscopy in 5 years           REPEAT EXAM:  In 5 year(s) for Colonoscopy.  ______________________________     Barbette Hair Arlyce Dice, MD           CC:  Nelwyn Salisbury, MD           n.     Rosalie DoctorBarbette Hair. Kaplan at 03/01/2009 02:05 PM           Page 2 of 3   Tyrone Roman, Tyrone Roman, Tyrone Roman  Note: An exclamation mark (!) indicates a result that was not dispersed into the flowsheet. Document Creation Date: 03/01/2009 2:05 PM _______________________________________________________________________  (1) Order result status: Final Collection or observation date-time: 03/01/2009 13:57 Requested date-time:  Receipt date-time:  Reported date-time:  Referring Physician:   Ordering Physician: Melvia Heaps [phone]) Specimen Source:  Source: Launa Grill Order Number: [id number] Lab site:   Appended Document: Colonoscopy     Procedures Next Due Date:    Colonoscopy: 02/2014

## 2010-03-09 NOTE — Letter (Signed)
Summary: Endoscopy Letter  [facility]  [address] [place], [place] [address]   Phone: [phone]  Fax: [phone]      February 22, 2010 MRN: [id number]   Tyrone Roman [address] [place], [place]  [address]   Dear [name],   According to your medical record, it is time for you to schedule an Endoscopy. Endoscopic screening is recommended for patients with certain upper digestive tract conditions because of associated increased risk for cancers of the upper digestive system.  This letter has been generated based on the recommendations made at the time of your prior procedure. If you feel that in your particular situation this may no longer apply, please contact our office.  Please call our office at [phone]) to schedule this appointment or to update your records at your earliest convenience.  Thank you for cooperating with [place] to provide you with the very best care possible.   Sincerely,  Barbette Hair. Arlyce Dice, M.D.  [facility] [phone]

## 2010-03-09 NOTE — Assessment & Plan Note (Signed)
Summary: rov/. gd   Visit Type:  rov Primary Provider:  Gershon Crane MD  CC:  pt denies any cardiac complaints today.  History of Present Illness: [name] returns today for evaluation management coronary artery disease. He is having no symptoms of profound fatigue or shortness of breath which are his ischemic equivalence. He has a history of a false negative stress Myoview in December of 2010. His last catheterization was in May which showed a patent LAD stent.  Clinical Reports Reviewed:  Cardiac Cath:  06/08/2009: Cardiac Cath Findings:   FINAL ASSESSMENT:   1. Widely patent left anterior descending (coronary artery) stent with       otherwise unchanged coronary angiography.   2. Normal right heart hemodynamics.               Veverly Fells. Excell Seltzer, MD      06/03/2009: Cardiac Cath Findings:   ANGIOGRAPHIC DATA:   1. The left anterior descending artery is a large-caliber vessel.       There is a 95% proximal stenosis just proximal to the takeoff of       the left anterior descending artery.  Following stenting and       postdilatation, the stenosis reduced to 0% residual luminal       narrowing.  The vessel appeared to be widely patent.  The remainder       of the distal LAD demonstrates some mild luminal irregularity with       probably 30% narrowing after the takeoff of the major diagonal in       the LAD.  No other critical disease is noted.  The circumflex is a       large-caliber dominant vessel without critical narrowing as       previously noted in the diagnostic report.      CONCLUSION:  Successful percutaneous stenting of the left anterior   descending artery using a drug-eluting platform.      DISPOSITION:  The patient will need a minimum of 1 year of aspirin and   currently is on prasugrel.  Following 1 year, the decision to continue   dual antiplatelet therapy will be at the discretion of the consulting   cardiologist.  Followup will be with Dr. Valera Castle  and Dr. Clent Ridges.               Arturo Morton. Riley Kill, MD, [facility]     Cardiac Cath Findings:   CONCLUSIONS:   1. Preserved overall left ventricular function.   2. Focal high-grade stenosis of the left anterior descending artery.      DISPOSITION:  These findings likely explain his angiographic findings.   There is a focal high-grade LAD stenosis which is favorable for   percutaneous intervention given the rapid increase in his symptoms to a   class 3 category, relief of symptoms with percutaneous coronary   intervention would be recommended.  Prior to this, we will recommend   that the patient receive prasugrel and percutaneous intervention will be   performed as soon as his daughter arrives.               Arturo Morton. Riley Kill, MD, [facility]      Nuclear Study:  01/17/2009:  Excerise capacity: Good exercise capacity  Blood Pressure response: Normal blood pressure response  Clinical symptoms: There is chest tightness  ECG impression: No significant ST segment change suggestive of ischemia  Overall impression: There is no sign of scar or  ischemia.  Olga Millers, MD, [facility]   Current Medications (verified): 1)  Bayer Aspirin 325 Mg  Tabs (Aspirin) .Marland Kitchen.. 1 Tab 4-5 Times Weekly 2)  Viagra 100 Mg Tabs (Sildenafil Citrate) .Marland Kitchen.. 1 By Mouth As Needed 3)  Omeprazole 40 Mg Cpdr (Omeprazole) .... Once Daily 4)  Tamsulosin Hcl 0.4 Mg Caps (Tamsulosin Hcl) .Marland Kitchen.. 1 Once Daily 5)  Simvastatin 40 Mg Tabs (Simvastatin) .Marland Kitchen.. 1 Tab At Bedtime 6)  Nitrostat 0.4 Mg Subl (Nitroglycerin) .Marland Kitchen.. 1 Tablet Under Tongue At Onset of Chest Pain; You May Repeat Every 5 Minutes For Up To 3 Doses. 7)  Effient 10 Mg Tabs (Prasugrel Hcl) .Marland Kitchen.. 1 Tab Once Daily 8)  Flexeril 10 Mg Tabs (Cyclobenzaprine Hcl) .... Three Times A Day As Needed Spasm  Allergies: 1)  ! Keflex 2)  ! Biaxin 3)  ! * Lidoderm Patches  Past History:  Past Medical History: Last updated: 06/10/2009 CAD, NATIVE VESSEL (ICD-414.01) CHEST  TIGHTNESS-PRESSURE-OTHER ([id number]) HYPERLIPIDEMIA (ICD-272.4) SHORTNESS OF BREATH (ICD-786.05) BARRETTS ESOPHAGUS (ICD-530.85) GERD (ICD-530.81) DYSPHAGIA UNSPECIFIED (ICD-787.20) PRE-OPERATIVE CARDIOVASCULAR EXAMINATION (ICD-V72.81) CONTACT WITH OR EXPOSURE TO VENEREAL DISEASES (ICD-V01.6) LACERATION, SCALP (ICD-873.0) CONCUSSION WITH LOC OF 30 MINUTES OR LESS (ICD-850.11) * TRAVEL HISTORY: NEPHROLITHIASIS (ICD-592.0) COLONIC POLYPS (ICD-211.3) DIVERTICULOSIS, COLON (ICD-562.10) WELL ADULT EXAM (ICD-V70.0) CONTACT DERMATITIS (ICD-692.9) FAMILY HISTORY OF COLON CA 1ST DEGREE RELATIVE <60 (ICD-V16.0) BENIGN PROSTATIC HYPERTROPHY (ICD-600.00)    Past Surgical History: Last updated: 06/10/2009 Tonsillectomy colonoscopy 03-01-09 per Dr. Arlyce Dice, had benign polyps,  repeat 5 yrs Lt knee surgery 25 years ago 1974 EGD with dilatation per Dr. Arlyce Dice 02-17-09, showed Barretts esophagus  Family History: Last updated: 07/01/2007 Family History of Arthritis Family History of Colon CA 1st degree relative <60 Father Family History of Prostate CA 1st degree relative <50 Family History of Cardiovascular disorder/ Father  Social History: Last updated: 05/31/2009 Occupation:Sales Mgr Married Children : girls 4 Former Smoker 1960's Alcohol use-yes Drug use-no Regular exercise-no Travel HistorySales Mgr:  Daily Caffeine Use Patient does not get regular exercise.   Risk Factors: Exercise: no (07/01/2007)  Risk Factors: Smoking Status: quit (11/04/2006)  Review of Systems       negative other than history of present illness  Vital Signs:  Patient profile:   65 year old male Height:      74 inches Weight:      227.4 pounds BMI:     29.30 Pulse rate:   102 / minute Pulse rhythm:   regular BP sitting:   112 / 80  (left arm) Cuff size:   large  Vitals Entered By: Danielle Rankin, CMA (November 24, 2009 12:19 PM)  Physical Exam  General:  Well developed, well nourished, in no  acute distress. Head:  normocephalic and atraumatic Eyes:  PERRLA/EOM intact; conjunctiva and lids normal. Neck:  Neck supple, no JVD. No masses, thyromegaly or abnormal cervical nodes. Chest Wall:  no deformities or breast masses noted Lungs:  Clear bilaterally to auscultation and percussion. Heart:  MR nondisplaced, normal S1-S2, no murmur or gallop. Carotids equal bilaterally without bruits Msk:  Back normal, normal gait. Muscle strength and tone normal. Pulses:  pulses normal in all 4 extremities Extremities:  No clubbing or cyanosis. Neurologic:  Alert and oriented x 3. Skin:  Intact without lesions or rashes. Psych:  Normal affect.   Impression & Recommendations:  Problem # 1:  CAD, NATIVE VESSEL (ICD-414.01) Assessment Unchanged we will follow him with symptoms only. No future Myoview planned. I'll see him back in May 2012. His  updated medication list for this problem includes:    Bayer Aspirin 325 Mg Tabs (Aspirin) .Marland Kitchen... 1 tab 4-5 times weekly    Nitrostat 0.4 Mg Subl (Nitroglycerin) .Marland Kitchen... 1 tablet under tongue at onset of chest pain; you may repeat every 5 minutes for up to 3 doses.    Effient 10 Mg Tabs (Prasugrel hcl) .Marland Kitchen... 1 tab once daily  Problem # 2:  SHORTNESS OF BREATH (ICD-786.05) Assessment: Improved  His updated medication list for this problem includes:    Bayer Aspirin 325 Mg Tabs (Aspirin) .Marland Kitchen... 1 tab 4-5 times weekly  Patient Instructions: 1)  Your physician recommends that you schedule a follow-up appointment in: 6months

## 2010-03-09 NOTE — Letter (Signed)
Summary: Patient Notice- Polyp Results   Gastroenterology  [address] [place], [place] [address]   Phone: [phone]  Fax: [phone]        March 08, 2009 MRN: [id number]    Tyrone Roman [address] [place], [place]  [address]    Dear [name],  I am pleased to inform you that the colon polyp(s) removed during your recent colonoscopy was (were) found to be benign (no cancer detected) upon pathologic examination.  I recommend you have a repeat colonoscopy examination in 5_ years to look for recurrent polyps, as having colon polyps increases your risk for having recurrent polyps or even colon cancer in the future.  Should you develop new or worsening symptoms of abdominal pain, bowel habit changes or bleeding from the rectum or bowels, please schedule an evaluation with either your primary care physician or with me.  Additional information/recommendations:  __ No further action with gastroenterology is needed at this time. Please      follow-up with your primary care physician for your other healthcare      needs.  __ Please call [phone] to schedule a return visit to review your      situation.  __ Please keep your follow-up visit as already scheduled.  _x_ Continue treatment plan as outlined the day of your exam.  Please call us if you are having persistent problems or have questions about your condition that have not been fully answered at this time.  Sincerely,  Louis Meckel MD  This letter has been electronically signed by your physician.  Appended Document: Patient Notice- Polyp Results letter mailed 2.2.11

## 2010-03-09 NOTE — Letter (Signed)
Summary: [facility]  [facility]   Imported By: Maryln Gottron 05/11/2009 12:14:00  _____________________________________________________________________  External Attachment:    Type:   Image     Comment:   External Document

## 2010-03-09 NOTE — Assessment & Plan Note (Signed)
Summary: POISON IVY/MHF   Vital Signs:  Patient Profile:   65 Years Old Male Weight:      232 pounds Temp:     98.3 degrees F oral Pulse rate:   83 / minute Pulse rhythm:   regular BP sitting:   118 / 82  (left arm) Cuff size:   large  Vitals Entered By: Alfred Levins, CMA (December 12, 2006 9:47 AM)                 Chief Complaint:  poison oak.  History of Present Illness: For the past 4 days has had an itchy rash over face, arms, legs, and trunk. Using OTC creams.  Current Allergies: ! KEFLEX ! BIAXIN ! * LIDODERM PATCHES     Review of Systems      See HPI   Physical Exam  General:     Well-developed,well-nourished,in no acute distress; alert,appropriate and cooperative throughout examination Skin:     widespread red papulovessicular rash as above    Impression & Recommendations:  Problem # 1:  CONTACT DERMATITIS (ICD-692.9)  His updated medication list for this problem includes:    Sterapred Ds 12 Day 10 Mg Tabs (Prednisone) .Marland Kitchen... As directed   Complete Medication List: 1)  Cialis 20 Mg Tabs (Tadalafil) .... As needed 2)  Percocet 10-325 Mg Tabs (Oxycodone-acetaminophen) .... As needed 3)  Bayer Aspirin 325 Mg Tabs (Aspirin) .Marland Kitchen.. 1 by mouth once daily 4)  Sterapred Ds 12 Day 10 Mg Tabs (Prednisone) .... As directed   Patient Instructions: 1)  Please schedule a follow-up appointment as needed.    Prescriptions: STERAPRED DS 12 DAY 10 MG  TABS (PREDNISONE) as directed  #1 x 0   Entered and Authorized by:   Nelwyn Salisbury MD   Signed by:   Nelwyn Salisbury MD on 12/12/2006   Method used:   Electronically sent to ...       [facility]  [address]  [phone]*       [address]       [place], [place]  [address]       Ph: [phone] or [phone]       Fax: [phone]   RxID:   [id number]  ]

## 2010-03-09 NOTE — Progress Notes (Signed)
  Phone Note From Other Clinic   Call For: Dr Juanito Doom Action Taken: Phone Call Completed, Provider Notified Summary of Call: Lab called, INR and PTT were within nl limits. D-Dimer was elevated at .90. Non-dedicated CT w/ contrast did not show central PE. Spoke w/ PN - no change in plan for now. Clinically sig PE should have been seen on the CT. OK to keep appt for cath in am. Further eval for SOB (?CPX/PFTs) if cath does not show reason for SOB.  Initial call taken by: Park Breed PA-C,  June 02, 2009 7:02 PM

## 2010-03-09 NOTE — Letter (Signed)
Summary: [facility]  [facility]   Imported By: Maryln Gottron 03/08/2009 14:45:57  _____________________________________________________________________  External Attachment:    Type:   Image     Comment:   External Document

## 2010-03-09 NOTE — Progress Notes (Signed)
Summary: Nuc. Pre-Procedure  Phone Note Outgoing Call Call back at [facility] Phone [phone]   Call placed by: Irean Hong, RN,  January 13, 2009 1:36 PM Summary of Call: Reviewed information on Myoview Information Sheet (see scanned document for further details).  Spoke with patient.     Nuclear Med Background Indications for Stress Test: Evaluation for Ischemia     Symptoms: Chest Pain, Chest Pain with Exertion, Chest Pressure, Chest Pressure with Exertion, Diaphoresis, Nausea    Nuclear Pre-Procedure Height (in): 73

## 2010-03-09 NOTE — Letter (Signed)
Summary: Appointment - Missed  [facility], Main Office  [address]   [place], [place] [address]   Phone: [phone]  Fax: [phone]     November 02, 2009 MRN: [id number]   Tyrone Roman [address] [place], [place]  [address]   Dear Mr. LOESCHER,  Our records indicate you missed your appointment on   10-21-2009  with   Dr.  Daleen Squibb   It is very important that we reach you to reschedule this appointment. We look forward to participating in your health care needs. Please contact us at the number listed above at your earliest convenience to reschedule this appointment.     Sincerely,       Lorne Skeens  [facility] Scheduling Team

## 2010-03-09 NOTE — Assessment & Plan Note (Signed)
Summary: cpx/mhf   Vital Signs:  Patient Profile:   65 Years Old Male Height:     73 inches Weight:      233 pounds Temp:     97.5 degrees F oral Pulse rate:   82 / minute Pulse rhythm:   regular BP sitting:   144 / 82  (left arm) Cuff size:   large  Vitals Entered By: Alfred Levins, CMA (Jun 06, 2007 1:05 PM)                 Chief Complaint:  cpx.  History of Present Illness: 65 yr old male for cpx. Has a couple of problems to address.  He has been having trouble swallowing at times for the past few months. He says food seems to stop partway down. No vomiting. He admits to a lot of heartburn and takes a lot of TUMS. BM's normal. Is due to have some fasting labs drawn at work soon.    Current Allergies (reviewed today): ! KEFLEX ! BIAXIN ! * LIDODERM PATCHES  Past Medical History:    Reviewed history from 11/04/2006 and no changes required:       Lumbar Disc Disease       Benign prostatic hypertrophy, sees Dr. Earlene Plater       GERD       UTI's       skin checks with Dr. Londell Moh  Past Surgical History:    Reviewed history from 11/04/2006 and no changes required:       Tonsillectomy       colonoscopy 10-21-03 per Dr. Arlyce Dice, repeat 5 yrs   Family History:    Reviewed history from 11/04/2006 and no changes required:       Family History of Arthritis       Family History of Colon CA 1st degree relative <60       Family History of Prostate CA 1st degree relative <50       Family History of Cardiovascular disorder  Social History:    Reviewed history from 11/04/2006 and no changes required:       Occupation:       Married       Former Smoker       Alcohol use-yes       Drug use-no       Regular exercise-no    Review of Systems      See HPI   Physical Exam  General:     Well-developed,well-nourished,in no acute distress; alert,appropriate and cooperative throughout examination Head:     Normocephalic and atraumatic without obvious abnormalities. No apparent  alopecia or balding. Eyes:     No corneal or conjunctival inflammation noted. EOMI. Perrla. Funduscopic exam benign, without hemorrhages, exudates or papilledema. Vision grossly normal. Ears:     External ear exam shows no significant lesions or deformities.  Otoscopic examination reveals clear canals, tympanic membranes are intact bilaterally without bulging, retraction, inflammation or discharge. Hearing is grossly normal bilaterally. Nose:     External nasal examination shows no deformity or inflammation. Nasal mucosa are pink and moist without lesions or exudates. Mouth:     Oral mucosa and oropharynx without lesions or exudates.  Teeth in good repair. Neck:     No deformities, masses, or tenderness noted. Chest Wall:     No deformities, masses, tenderness or gynecomastia noted. Lungs:     Normal respiratory effort, chest expands symmetrically. Lungs are clear to auscultation, no crackles or wheezes. Heart:  Normal rate and regular rhythm. S1 and S2 normal without gallop, murmur, click, rub or other extra sounds. EKG normal. Abdomen:     Bowel sounds positive,abdomen soft and non-tender without masses, organomegaly or hernias noted. Msk:     No deformity or scoliosis noted of thoracic or lumbar spine.   Pulses:     R and L carotid,radial,femoral,dorsalis pedis and posterior tibial pulses are full and equal bilaterally Extremities:     No clubbing, cyanosis, edema, or deformity noted with normal full range of motion of all joints.   Neurologic:     No cranial nerve deficits noted. Station and gait are normal. Plantar reflexes are down-going bilaterally. DTRs are symmetrical throughout. Sensory, motor and coordinative functions appear intact. Skin:     Intact without suspicious lesions or rashes Cervical Nodes:     No lymphadenopathy noted Axillary Nodes:     No palpable lymphadenopathy Inguinal Nodes:     No significant adenopathy Psych:     Cognition and judgment appear  intact. Alert and cooperative with normal attention span and concentration. No apparent delusions, illusions, hallucinations    Impression & Recommendations:  Problem # 1:  WELL ADULT EXAM (ICD-V70.0)  Orders: EKG w/ Interpretation (93000)   Complete Medication List: 1)  Bayer Aspirin 325 Mg Tabs (Aspirin) .Marland Kitchen.. 1 by mouth once daily 2)  Viagra 100 Mg Tabs (Sildenafil citrate) .Marland Kitchen.. 1 by mouth as needed 3)  Nexium 40 Mg Cpdr (Esomeprazole magnesium) .... Once daily  Other Orders: Gastroenterology Referral (GI)   Patient Instructions: 1)  He will send me a copy of his upcoming lab tests for me to review.  We will begin daily acid suppresion with Nexium and refer him to Dr. Arlyce Dice for EGD. He will work on diet and exercise, and we will recheck his BP in 3 months.    Prescriptions: NEXIUM 40 MG  CPDR (ESOMEPRAZOLE MAGNESIUM) once daily  #30 x 11   Entered and Authorized by:   Nelwyn Salisbury MD   Signed by:   Nelwyn Salisbury MD on 06/06/2007   Method used:   Electronically sent to ...       [facility]       [address]       [place], [place]  [address]       Ph: [phone]       Fax: [phone]   RxID:   [id number]  ]

## 2010-03-09 NOTE — Progress Notes (Signed)
Summary: rtc  Phone Note Call from Patient Call back at Home Phone [phone]   Caller: Patient Call For: Nelwyn Salisbury MD Summary of Call: PT is returning gina call Initial call taken by: Heron Sabins,  January 04, 2010 11:47 AM  Follow-up for Phone Call        pt called Follow-up by: Pura Spice, RN,  January 04, 2010 12:30 PM

## 2010-03-09 NOTE — Progress Notes (Signed)
Summary: SOB  Phone Note Call from Patient Call back at Home Phone [phone]   Caller: Patient Reason for Call: Talk to Doctor Summary of Call: had stent placed on friday... SOB since Sunday, no chest pain Initial call taken by: Migdalia Dk,  Jun 07, 2009 9:22 AM  Follow-up for Phone Call        CALLED PT RE MESS C/O SOB SINCE SUN  WITH LITTLE ACTIVITY NO CHEST TIGHTNESS AS PREVIOUSLY NOTED FELT FINE SAT IS ON ANTI  PLATELET THERAPY HAS NOT BEEN EVALUATED BY PULMONARY AS OF YET WILL DISCUSS WITH DR Caris Cerveny AND CALL BACK WITH TX PLAN.PT VERBALIZED UNDERSTANDING.  Follow-up by: Scherrie Bateman, LPN,  Jun 08, 979 9:41 AM  Additional Follow-up for Phone Call Additional follow up Details #1::        Needs reassesment with cath since DOE has recurred since Saturday. Has he tried NTG.       Additional Follow-up by: Gaylord Shih, MD, [facility],  Jun 07, 2009 2:41 PM    Additional Follow-up for Phone Call Additional follow up Details #2::    PT AWARE INSTRUCTED TO GO TO ER FOR EVAL AND TX PER DR Tammela Bales RE CATH  TOMMORROW. HAS NOT TRIED NTG. Follow-up by: Scherrie Bateman, LPN,  Jun 07, 1912 3:03 PM

## 2010-03-09 NOTE — Assessment & Plan Note (Signed)
Summary: Cardiology Nuclear Study  Nuclear Med Background Indications for Stress Test: Evaluation for Ischemia     Symptoms: Chest Pain, Chest Pain with Exertion, Chest Pressure, Chest Pressure with Exertion, Diaphoresis, Nausea    Nuclear Pre-Procedure Caffeine/Decaff Intake: none NPO After: 8:30 PM Lungs: clear IV 0.9% NS with Angio Cath: 18g     IV Site: (R) AC IV Started by: Stanton Kidney EMT-P Chest Size (in) 46     Height (in): 74 Weight (lb): 227 BMI: 29.25  Nuclear Med Study 1 or 2 day study:  1 day     Stress Test Type:  Stress Reading MD:  Olga Millers, MD     Referring MD:  S.Fry Resting Radionuclide:  Technetium 64m Tetrofosmin     Resting Radionuclide Dose:  10.9 mCi  Stress Radionuclide:  Technetium 26m Tetrofosmin     Stress Radionuclide Dose:  33.0 mCi   Stress Protocol Exercise Time (min):  9:00 min     Max HR:  141 bpm     Predicted Max HR:  157 bpm  Max Systolic BP: 155 mm Hg     Percent Max HR:  89.81 %     METS: 10.4 Rate Pressure Product:  29562    Stress Test Technologist:  Milana Na EMT-P     Nuclear Technologist:  Burna Mortimer Deal RT-N  Rest Procedure  Myocardial perfusion imaging was performed at rest 45 minutes following the intravenous administration of Myoview Technetium 3m Tetrofosmin.  Stress Procedure  The patient exercised for 9:00. The patient stopped due to fatigue, sob, and chest tightness.  There were no significant ST-T wave changes.  Myoview was injected at peak exercise and myocardial perfusion imaging was performed after a brief delay.  QPS Raw Data Images:  Acuisition technically good; normal left ventricular size. Stress Images:  There is normal uptake in all areas. Rest Images:  Normal homogeneous uptake in all areas of the myocardium. Subtraction (SDS):  No evidence of ischemia. Transient Ischemic Dilatation:  1.07  (Normal <1.22)  Lung/Heart Ratio:  .37  (Normal <0.45)  Quantitative Gated Spect Images QGS EDV:  78  ml QGS ESV:  27 ml QGS EF:  66 % QGS cine images:  Normal wall motion.   Overall Impression  Exercise Capacity: Good exercise capacity. BP Response: Normal blood pressure response. Clinical Symptoms: There is chest tightness. ECG Impression: No significant ST segment change suggestive of ischemia. Overall Impression: There is no sign of scar or ischemia.  Appended Document: Cardiology Nuclear Study normal stress test  Appended Document: Cardiology Nuclear Study pt aware

## 2010-03-09 NOTE — Progress Notes (Signed)
Summary: please resend rx to a different drugstore   Phone Note Call from Patient   Caller: pt live Call For: Clent Ridges  Summary of Call: says [facility] [facility] [employer] [facility] never got the rx for Viagra 100mg  on 6-2.  Would you please send the rx to [facility] on [address]  Maybe they will receive it and fill it.  Initial call taken by: Roselle Locus,  July 22, 2007 3:15 PM  Follow-up for Phone Call        Phone Call Completed, Rx Called In Follow-up by: Alfred Levins, CMA,  July 23, 2007 8:22 AM      Prescriptions: VIAGRA 100 MG TABS (SILDENAFIL CITRATE) 1 by mouth as needed  #11 Each x 5   Entered by:   Alfred Levins, CMA   Authorized by:   Nelwyn Salisbury MD   Signed by:   Alfred Levins, CMA on 07/23/2007   Method used:   Electronically sent to ...       [facility]       [address]       [place], [place]  [address]       Ph: [phone]       Fax: [phone]   RxID:   [id number]

## 2010-03-09 NOTE — Letter (Signed)
Summary: Patient Notice-Endo Biopsy Results  [facility]  [address] [place], [place] [address]   Phone: [phone]  Fax: [phone]        July 09, 2007 MRN: [id number]    Tyrone Roman [address] [place], [place]  [address]    Dear Mr. DENHAM,  I am pleased to inform you that the biopsies taken during your recent endoscopic examination did not show any evidence of cancer upon pathologic examination.  There was inflammation only at the junction of the esophagus and stomach which is where biopsies were taken  Additional information/recommendations:  __No further action is needed at this time.  Please follow-up with      your primary care physician for your other healthcare needs.  __ Please call [phone] to schedule a return visit to review      your condition.  _x_ Continue with the treatment plan as outlined on the day of your      exam.  __ You should have a repeat endoscopic examination for thi_s problem              in _ months/years.   Please call us if you are having persistent problems or have questions about your condition that have not been fully answered at this time.  Sincerely,  Louis Meckel MD  This letter has been electronically signed by your physician.

## 2010-03-09 NOTE — Assessment & Plan Note (Signed)
Summary: eph/jss   Visit Type:  EPH Primary Provider:  Gershon Crane MD  CC:  sob for a couple of days after he had come out of the hospital but is doing  better now.....  History of Present Illness: [name] returns today for followup of his coronary disease.  He presented with primarily dyspnea on exertion shortness of breath. When pushed, he did admit to some chest tightness.  He had negative stress Myoview.  We underwent an outpatient cardiac catheterization which showed a high-grade LAD. He also had moderate calcification present. At that time he has stent placed with a drug-eluting stent.  He went and the next day his shortness of breath returned. We readmitted him and restudied him which showed a widely patent stent.  The circumflex showed minimal plaquing in the first marginal. There was no obstructive plaque in the right coronary artery which was a nondominant vessel. He had normal left ventricular function.  Since discharge, he had recurrent shortness of breath which he now attributes to the cream which she lists under his medications. He is no longer taking this. The package insert showed this as a side effect.  He is back doing his usual activity without any dyspnea on exertion or angina. He seems very compliant with his meds.  Current Medications (verified): 1)  Bayer Aspirin 325 Mg  Tabs (Aspirin) .Marland Kitchen.. 1 Tab 4-5 Times Weekly 2)  Viagra 100 Mg Tabs (Sildenafil Citrate) .Marland Kitchen.. 1 By Mouth As Needed 3)  Omeprazole 40 Mg Cpdr (Omeprazole) .... Once Daily 4)  Tamsulosin Hcl 0.4 Mg Caps (Tamsulosin Hcl) .Marland Kitchen.. 1 Once Daily 5)  Simvastatin 40 Mg Tabs (Simvastatin) .Marland Kitchen.. 1 Tab At Bedtime 6)  Nitrostat 0.4 Mg Subl (Nitroglycerin) .Marland Kitchen.. 1 Tablet Under Tongue At Onset of Chest Pain; You May Repeat Every 5 Minutes For Up To 3 Doses. 7)  Effient 10 Mg Tabs (Prasugrel Hcl) .Marland Kitchen.. 1 Tab Once Daily  Allergies: 1)  ! Keflex 2)  ! Biaxin 3)  ! * Lidoderm Patches  Past History:  Past  Medical History: Last updated: 06/10/2009 CAD, NATIVE VESSEL (ICD-414.01) CHEST TIGHTNESS-PRESSURE-OTHER ([id number]) HYPERLIPIDEMIA (ICD-272.4) SHORTNESS OF BREATH (ICD-786.05) BARRETTS ESOPHAGUS (ICD-530.85) GERD (ICD-530.81) DYSPHAGIA UNSPECIFIED (ICD-787.20) PRE-OPERATIVE CARDIOVASCULAR EXAMINATION (ICD-V72.81) CONTACT WITH OR EXPOSURE TO VENEREAL DISEASES (ICD-V01.6) LACERATION, SCALP (ICD-873.0) CONCUSSION WITH LOC OF 30 MINUTES OR LESS (ICD-850.11) * TRAVEL HISTORY: NEPHROLITHIASIS (ICD-592.0) COLONIC POLYPS (ICD-211.3) DIVERTICULOSIS, COLON (ICD-562.10) WELL ADULT EXAM (ICD-V70.0) CONTACT DERMATITIS (ICD-692.9) FAMILY HISTORY OF COLON CA 1ST DEGREE RELATIVE <60 (ICD-V16.0) BENIGN PROSTATIC HYPERTROPHY (ICD-600.00)    Past Surgical History: Last updated: 06/10/2009 Tonsillectomy colonoscopy 03-01-09 per Dr. Arlyce Dice, had benign polyps,  repeat 5 yrs Lt knee surgery 25 years ago 1974 EGD with dilatation per Dr. Arlyce Dice 02-17-09, showed Barretts esophagus  Family History: Last updated: 07/01/2007 Family History of Arthritis Family History of Colon CA 1st degree relative <60 Father Family History of Prostate CA 1st degree relative <50 Family History of Cardiovascular disorder/ Father  Social History: Last updated: 05/31/2009 Occupation:Sales Mgr Married Children : girls 4 Former Smoker 1960's Alcohol use-yes Drug use-no Regular exercise-no Travel HistorySales Mgr:  Daily Caffeine Use Patient does not get regular exercise.   Risk Factors: Exercise: no (07/01/2007)  Risk Factors: Smoking Status: quit (11/04/2006)  Review of Systems       negative other than history of present illness  Vital Signs:  Patient profile:   65 year old male Height:      74 inches Weight:  217 pounds Pulse rate:   90 / minute Pulse rhythm:   regular BP sitting:   100 / 70  (left arm) Cuff size:   large  Vitals Entered By: Danielle Rankin, CMA (Jun 20, 2009 9:55  AM)  Physical Exam  General:  Well developed, well nourished, in no acute distress. Head:  normocephalic and atraumatic Eyes:  PERRLA/EOM intact; conjunctiva and lids normal. Neck:  Neck supple, no JVD. No masses, thyromegaly or abnormal cervical nodes. Chest Annelie Boak:  no deformities or breast masses noted Lungs:  Clear bilaterally to auscultation and percussion. Heart:  Non-displaced PMI, chest non-tender; regular rate and rhythm, S1, S2 without murmurs, rubs or gallops. Carotid upstroke normal, no bruit. Normal abdominal aortic size, no bruits. Femorals normal pulses, no bruits. Pedals normal pulses. No edema, no varicosities. Msk:  Back normal, normal gait. Muscle strength and tone normal. Pulses:  pulses normal in all 4 extremities Extremities:  No clubbing or cyanosis. Neurologic:  Alert and oriented x 3. Skin:  Intact without lesions or rashes. Psych:  Normal affect.   EKG  Procedure date:  06/20/2009  Findings:      normal sinus rhythm, normal EKG  Impression & Recommendations:  Problem # 1:  CHEST TIGHTNESS-PRESSURE-OTHER ([id number]) Assessment Improved  Problem # 2:  CAD, NATIVE VESSEL (ICD-414.01)  His updated medication list for this problem includes:    Bayer Aspirin 325 Mg Tabs (Aspirin) .Marland Kitchen... 1 tab 4-5 times weekly    Nitrostat 0.4 Mg Subl (Nitroglycerin) .Marland Kitchen... 1 tablet under tongue at onset of chest pain; you may repeat every 5 minutes for up to 3 doses.    Effient 10 Mg Tabs (Prasugrel hcl) .Marland Kitchen... 1 tab once daily  His updated medication list for this problem includes:    Bayer Aspirin 325 Mg Tabs (Aspirin) .Marland Kitchen... 1 tab 4-5 times weekly    Nitrostat 0.4 Mg Subl (Nitroglycerin) .Marland Kitchen... 1 tablet under tongue at onset of chest pain; you may repeat every 5 minutes for up to 3 doses.    Effient 10 Mg Tabs (Prasugrel hcl) .Marland Kitchen... 1 tab once daily  His updated medication list for this problem includes:    Bayer Aspirin 325 Mg Tabs (Aspirin) .Marland Kitchen... 1 tab 4-5 times weekly     Nitrostat 0.4 Mg Subl (Nitroglycerin) .Marland Kitchen... 1 tablet under tongue at onset of chest pain; you may repeat every 5 minutes for up to 3 doses.    Effient 10 Mg Tabs (Prasugrel hcl) .Marland Kitchen... 1 tab once daily  Problem # 3:  HYPERLIPIDEMIA (ICD-272.4)  He  needs fasting lipids and liver panel. Goal LDL of less than 70. He has a history of low HDL. His updated medication list for this problem includes:    Simvastatin 40 Mg Tabs (Simvastatin) .Marland Kitchen... 1 tab at bedtime  His updated medication list for this problem includes:    Simvastatin 40 Mg Tabs (Simvastatin) .Marland Kitchen... 1 tab at bedtime  His updated medication list for this problem includes:    Simvastatin 40 Mg Tabs (Simvastatin) .Marland Kitchen... 1 tab at bedtime  Problem # 4:  SHORTNESS OF BREATH (ICD-786.05) Assessment: Improved This is his anginal equivalent. I reinforced this today with him. He has been reinstructed on how to use nitroglycerin and unstable angina and how to respond His updated medication list for this problem includes:    Bayer Aspirin 325 Mg Tabs (Aspirin) .Marland Kitchen... 1 tab 4-5 times weekly  His updated medication list for this problem includes:    Bayer Aspirin 325 Mg Tabs (Aspirin) .Marland KitchenMarland KitchenMarland KitchenMarland Kitchen  1 tab 4-5 times weekly  His updated medication list for this problem includes:    Bayer Aspirin 325 Mg Tabs (Aspirin) .Marland Kitchen... 1 tab 4-5 times weekly  Problem # 5:  GERD (ICD-530.81)  His updated medication list for this problem includes:    Omeprazole 40 Mg Cpdr (Omeprazole) ..... Once daily  His updated medication list for this problem includes:    Omeprazole 40 Mg Cpdr (Omeprazole) ..... Once daily  His updated medication list for this problem includes:    Omeprazole 40 Mg Cpdr (Omeprazole) ..... Once daily  Problem # 6:  BARRETTS ESOPHAGUS (ICD-530.85)  Patient Instructions: 1)  Your physician recommends that you schedule a follow-up appointment in: 4 months with dr Tommie Dejoseph 2)  Your physician recommends that you continue on your current medications  as directed. Please refer to the Current Medication list given to you today. 3)  Your physician recommended you take 1 tablet (or 1 spray) under tongue at onset of chest pain; you may repeat every 5 minutes for up to 3 doses. If 3 or more doses are required, call 911 and proceed to the ER immediately. 4)  Your physician recommends that you return for lab work [id number] LIPID LIVER 272.4 V58.69

## 2010-03-10 NOTE — Letter (Signed)
Summary: ER NOTIFICATION  ER NOTIFICATION   Imported By: Marylou Mccoy 07/21/2009 16:34:24  _____________________________________________________________________  External Attachment:    Type:   Image     Comment:   External Document

## 2010-03-23 ENCOUNTER — Other Ambulatory Visit: Payer: Self-pay | Admitting: Psychiatry

## 2010-03-23 DIAGNOSIS — M542 Cervicalgia: Secondary | ICD-10-CM

## 2010-03-24 ENCOUNTER — Ambulatory Visit
Admission: RE | Admit: 2010-03-24 | Discharge: 2010-03-24 | Disposition: A | Discharge status: Home / Self Care | Payer: 59 | Source: Ambulatory Visit | Attending: Psychiatry | Admitting: Psychiatry

## 2010-03-24 DIAGNOSIS — M542 Cervicalgia: Secondary | ICD-10-CM

## 2010-04-20 LAB — POCT I-STAT, CHEM 8
BUN: 23 mg/dL (ref 6–23)
Calcium, Ion: 1.11 mmol/L — ABNORMAL LOW (ref 1.12–1.32)
Chloride: 109 mEq/L (ref 96–112)
Creatinine, Ser: 1.1 mg/dL (ref 0.4–1.5)
Glucose, Bld: 90 mg/dL (ref 70–99)
HCT: 42 % (ref 39.0–52.0)
Hemoglobin: 14.3 g/dL (ref 13.0–17.0)
Potassium: 3.6 mEq/L (ref 3.5–5.1)
Sodium: 141 mEq/L (ref 135–145)
TCO2: 24 mmol/L (ref 0–100)

## 2010-04-20 LAB — CBC
HCT: 41 % (ref 39.0–52.0)
Hemoglobin: 14.2 g/dL (ref 13.0–17.0)
MCH: 30.3 pg (ref 26.0–34.0)
MCHC: 34.5 g/dL (ref 30.0–36.0)
MCV: 87.9 fL (ref 78.0–100.0)
Platelets: 193 10*3/uL (ref 150–400)
RBC: 4.67 MIL/uL (ref 4.22–5.81)
RDW: 13 % (ref 11.5–15.5)
WBC: 9.1 10*3/uL (ref 4.0–10.5)

## 2010-04-20 LAB — PROTIME-INR
INR: 1.03 (ref 0.00–1.49)
Prothrombin Time: 13.7 seconds (ref 11.6–15.2)

## 2010-04-20 LAB — DIFFERENTIAL
Basophils Absolute: 0.1 10*3/uL (ref 0.0–0.1)
Basophils Relative: 1 % (ref 0–1)
Eosinophils Absolute: 0.3 10*3/uL (ref 0.0–0.7)
Eosinophils Relative: 4 % (ref 0–5)
Lymphocytes Relative: 15 % (ref 12–46)
Lymphs Abs: 1.4 10*3/uL (ref 0.7–4.0)
Monocytes Absolute: 0.7 10*3/uL (ref 0.1–1.0)
Monocytes Relative: 8 % (ref 3–12)
Neutro Abs: 6.6 10*3/uL (ref 1.7–7.7)
Neutrophils Relative %: 72 % (ref 43–77)

## 2010-04-20 LAB — APTT: aPTT: 33 seconds (ref 24–37)

## 2010-04-25 LAB — POCT I-STAT 3, VENOUS BLOOD GAS (G3P V)
Acid-base deficit: 1 mmol/L (ref 0.0–2.0)
Acid-base deficit: 2 mmol/L (ref 0.0–2.0)
Bicarbonate: 23.5 mEq/L (ref 20.0–24.0)
Bicarbonate: 24.5 mEq/L — ABNORMAL HIGH (ref 20.0–24.0)
O2 Saturation: 68 %
O2 Saturation: 70 %
TCO2: 25 mmol/L (ref 0–100)
TCO2: 26 mmol/L (ref 0–100)
pCO2, Ven: 41.5 mmHg — ABNORMAL LOW (ref 45.0–50.0)
pCO2, Ven: 43 mmHg — ABNORMAL LOW (ref 45.0–50.0)
pH, Ven: 7.36 — ABNORMAL HIGH (ref 7.250–7.300)
pH, Ven: 7.364 — ABNORMAL HIGH (ref 7.250–7.300)
pO2, Ven: 37 mmHg (ref 30.0–45.0)
pO2, Ven: 38 mmHg (ref 30.0–45.0)

## 2010-04-25 LAB — CARDIAC PANEL(CRET KIN+CKTOT+MB+TROPI)
CK, MB: 0.9 ng/mL (ref 0.3–4.0)
Relative Index: INVALID (ref 0.0–2.5)
Total CK: 38 U/L (ref 7–232)
Troponin I: 0.06 ng/mL (ref 0.00–0.06)

## 2010-04-25 LAB — CK TOTAL AND CKMB (NOT AT ARMC)
CK, MB: 1.4 ng/mL (ref 0.3–4.0)
Relative Index: INVALID (ref 0.0–2.5)
Total CK: 61 U/L (ref 7–232)

## 2010-04-25 LAB — COMPREHENSIVE METABOLIC PANEL
ALT: 34 U/L (ref 0–53)
AST: 26 U/L (ref 0–37)
Albumin: 3.1 g/dL — ABNORMAL LOW (ref 3.5–5.2)
Alkaline Phosphatase: 56 U/L (ref 39–117)
BUN: 15 mg/dL (ref 6–23)
CO2: 23 mEq/L (ref 19–32)
Calcium: 8.6 mg/dL (ref 8.4–10.5)
Chloride: 111 mEq/L (ref 96–112)
Creatinine, Ser: 0.89 mg/dL (ref 0.4–1.5)
GFR calc Af Amer: >60 mL/min (ref >60)
GFR calc non Af Amer: >60 mL/min (ref >60)
Glucose, Bld: 115 mg/dL — ABNORMAL HIGH (ref 70–99)
Potassium: 3.5 mEq/L (ref 3.5–5.1)
Sodium: 140 mEq/L (ref 135–145)
Total Bilirubin: 0.9 mg/dL (ref 0.3–1.2)
Total Protein: 6.3 g/dL (ref 6.0–8.3)

## 2010-04-25 LAB — POCT I-STAT 3, ART BLOOD GAS (G3+)
Acid-base deficit: 2 mmol/L (ref 0.0–2.0)
Bicarbonate: 23.4 mEq/L (ref 20.0–24.0)
O2 Saturation: 94 %
TCO2: 25 mmol/L (ref 0–100)
pCO2 arterial: 40.7 mmHg (ref 35.0–45.0)
pH, Arterial: 7.369 (ref 7.350–7.450)
pO2, Arterial: 73 mmHg — ABNORMAL LOW (ref 80.0–100.0)

## 2010-04-25 LAB — CBC
HCT: 36.6 % — ABNORMAL LOW (ref 39.0–52.0)
HCT: 38.7 % — ABNORMAL LOW (ref 39.0–52.0)
HCT: 38.6 % — ABNORMAL LOW (ref 39.0–52.0)
Hemoglobin: 12.9 g/dL — ABNORMAL LOW (ref 13.0–17.0)
Hemoglobin: 13.7 g/dL (ref 13.0–17.0)
Hemoglobin: 13.5 g/dL (ref 13.0–17.0)
MCHC: 35.3 g/dL (ref 30.0–36.0)
MCHC: 35.4 g/dL (ref 30.0–36.0)
MCHC: 35 g/dL (ref 30.0–36.0)
MCV: 87.8 fL (ref 78.0–100.0)
MCV: 87.9 fL (ref 78.0–100.0)
MCV: 88.4 fL (ref 78.0–100.0)
Platelets: 171 10*3/uL (ref 150–400)
Platelets: 195 10*3/uL (ref 150–400)
Platelets: 139 10*3/uL — ABNORMAL LOW (ref 150–400)
RBC: 4.16 MIL/uL — ABNORMAL LOW (ref 4.22–5.81)
RBC: 4.41 MIL/uL (ref 4.22–5.81)
RBC: 4.37 MIL/uL (ref 4.22–5.81)
RDW: 13.4 % (ref 11.5–15.5)
RDW: 13.6 % (ref 11.5–15.5)
RDW: 13.8 % (ref 11.5–15.5)
WBC: 6.3 10*3/uL (ref 4.0–10.5)
WBC: 7.1 10*3/uL (ref 4.0–10.5)
WBC: 4.7 10*3/uL (ref 4.0–10.5)

## 2010-04-25 LAB — BASIC METABOLIC PANEL
BUN: 10 mg/dL (ref 6–23)
CO2: 25 mEq/L (ref 19–32)
Calcium: 8.7 mg/dL (ref 8.4–10.5)
Chloride: 109 mEq/L (ref 96–112)
Creatinine, Ser: 0.77 mg/dL (ref 0.4–1.5)
GFR calc Af Amer: >60 mL/min (ref >60)
GFR calc non Af Amer: >60 mL/min (ref >60)
Glucose, Bld: 104 mg/dL — ABNORMAL HIGH (ref 70–99)
Potassium: 3.7 mEq/L (ref 3.5–5.1)
Sodium: 139 mEq/L (ref 135–145)

## 2010-04-25 LAB — POCT I-STAT, CHEM 8
BUN: 23 mg/dL (ref 6–23)
Calcium, Ion: 1.19 mmol/L (ref 1.12–1.32)
Chloride: 108 mEq/L (ref 96–112)
Creatinine, Ser: 0.8 mg/dL (ref 0.4–1.5)
Glucose, Bld: 177 mg/dL — ABNORMAL HIGH (ref 70–99)
HCT: 40 % (ref 39.0–52.0)
Hemoglobin: 13.6 g/dL (ref 13.0–17.0)
Potassium: 3.8 mEq/L (ref 3.5–5.1)
Sodium: 140 mEq/L (ref 135–145)
TCO2: 24 mmol/L (ref 0–100)

## 2010-04-25 LAB — PROTIME-INR
INR: 1.09 (ref 0.00–1.49)
Prothrombin Time: 14 seconds (ref 11.6–15.2)

## 2010-04-25 LAB — TROPONIN I: Troponin I: 0.08 ng/mL — ABNORMAL HIGH (ref 0.00–0.06)

## 2010-04-25 LAB — POCT CARDIAC MARKERS
CKMB, poc: <1 ng/mL — ABNORMAL LOW (ref 1.0–8.0)
Myoglobin, poc: 84 ng/mL (ref 12–200)
Troponin i, poc: <0.05 ng/mL (ref 0.00–0.09)

## 2010-04-25 LAB — HEPARIN LEVEL (UNFRACTIONATED): Heparin Unfractionated: 0.61 IU/mL (ref 0.30–0.70)

## 2010-04-25 LAB — D-DIMER, QUANTITATIVE: D-Dimer, Quant: 0.92 ug/mL-FEU — ABNORMAL HIGH (ref 0.00–0.48)

## 2010-05-29 ENCOUNTER — Other Ambulatory Visit: Payer: Self-pay | Admitting: Internal Medicine

## 2010-06-17 ENCOUNTER — Other Ambulatory Visit: Payer: Self-pay | Admitting: Cardiology

## 2010-08-03 ENCOUNTER — Telehealth: Payer: Self-pay | Admitting: Cardiology

## 2010-08-03 NOTE — Telephone Encounter (Signed)
Pt having some angina, sob for a couple days, and bruising for about a month-wants to know if needs to be seen today

## 2010-08-04 ENCOUNTER — Ambulatory Visit (INDEPENDENT_AMBULATORY_CARE_PROVIDER_SITE_OTHER): Payer: PRIVATE HEALTH INSURANCE | Admitting: Cardiology

## 2010-08-04 ENCOUNTER — Inpatient Hospital Stay (HOSPITAL_COMMUNITY): Payer: PRIVATE HEALTH INSURANCE

## 2010-08-04 ENCOUNTER — Encounter: Payer: Self-pay | Admitting: Cardiology

## 2010-08-04 ENCOUNTER — Telehealth: Payer: Self-pay | Admitting: *Deleted

## 2010-08-04 ENCOUNTER — Inpatient Hospital Stay (HOSPITAL_COMMUNITY)
Admission: AD | Admit: 2010-08-04 | Discharge: 2010-08-07 | DRG: 287 | Disposition: A | Discharge status: Home / Self Care | Payer: PRIVATE HEALTH INSURANCE | Source: Ambulatory Visit | Attending: Cardiology | Admitting: Cardiology

## 2010-08-04 DIAGNOSIS — R0789 Other chest pain: Principal | ICD-10-CM | POA: Diagnosis present

## 2010-08-04 DIAGNOSIS — T148XXA Other injury of unspecified body region, initial encounter: Secondary | ICD-10-CM | POA: Insufficient documentation

## 2010-08-04 DIAGNOSIS — J4489 Other specified chronic obstructive pulmonary disease: Secondary | ICD-10-CM | POA: Diagnosis present

## 2010-08-04 DIAGNOSIS — K2270 Barrett's esophagus without dysplasia: Secondary | ICD-10-CM | POA: Diagnosis present

## 2010-08-04 DIAGNOSIS — I200 Unstable angina: Secondary | ICD-10-CM

## 2010-08-04 DIAGNOSIS — Z7902 Long term (current) use of antithrombotics/antiplatelets: Secondary | ICD-10-CM

## 2010-08-04 DIAGNOSIS — X58XXXA Exposure to other specified factors, initial encounter: Secondary | ICD-10-CM | POA: Diagnosis present

## 2010-08-04 DIAGNOSIS — J449 Chronic obstructive pulmonary disease, unspecified: Secondary | ICD-10-CM

## 2010-08-04 DIAGNOSIS — I2510 Atherosclerotic heart disease of native coronary artery without angina pectoris: Secondary | ICD-10-CM | POA: Diagnosis present

## 2010-08-04 DIAGNOSIS — S301XXA Contusion of abdominal wall, initial encounter: Secondary | ICD-10-CM | POA: Diagnosis present

## 2010-08-04 DIAGNOSIS — E785 Hyperlipidemia, unspecified: Secondary | ICD-10-CM | POA: Diagnosis present

## 2010-08-04 DIAGNOSIS — K219 Gastro-esophageal reflux disease without esophagitis: Secondary | ICD-10-CM

## 2010-08-04 DIAGNOSIS — N400 Enlarged prostate without lower urinary tract symptoms: Secondary | ICD-10-CM | POA: Diagnosis present

## 2010-08-04 DIAGNOSIS — Z7982 Long term (current) use of aspirin: Secondary | ICD-10-CM

## 2010-08-04 LAB — CARDIAC PANEL(CRET KIN+CKTOT+MB+TROPI)
CK, MB: 4.4 ng/mL — ABNORMAL HIGH (ref 0.3–4.0)
Relative Index: 3.7 — ABNORMAL HIGH (ref 0.0–2.5)
Total CK: 118 U/L (ref 7–232)
Troponin I: <0.3 ng/mL (ref <0.30)

## 2010-08-04 LAB — COMPREHENSIVE METABOLIC PANEL
ALT: 20 U/L (ref 0–53)
AST: 21 U/L (ref 0–37)
Albumin: 3.6 g/dL (ref 3.5–5.2)
Alkaline Phosphatase: 55 U/L (ref 39–117)
BUN: 20 mg/dL (ref 6–23)
CO2: 25 mEq/L (ref 19–32)
Calcium: 9.4 mg/dL (ref 8.4–10.5)
Chloride: 104 mEq/L (ref 96–112)
Creatinine, Ser: 0.84 mg/dL (ref 0.50–1.35)
GFR calc Af Amer: >60 mL/min (ref >60)
GFR calc non Af Amer: >60 mL/min (ref >60)
Glucose, Bld: 93 mg/dL (ref 70–99)
Potassium: 3.8 mEq/L (ref 3.5–5.1)
Sodium: 137 mEq/L (ref 135–145)
Total Bilirubin: 0.4 mg/dL (ref 0.3–1.2)
Total Protein: 6.9 g/dL (ref 6.0–8.3)

## 2010-08-04 LAB — MAGNESIUM: Magnesium: 2.1 mg/dL (ref 1.5–2.5)

## 2010-08-04 LAB — CBC
HCT: 38.7 % — ABNORMAL LOW (ref 39.0–52.0)
Hemoglobin: 13.6 g/dL (ref 13.0–17.0)
MCH: 29.8 pg (ref 26.0–34.0)
MCHC: 35.1 g/dL (ref 30.0–36.0)
MCV: 84.7 fL (ref 78.0–100.0)
Platelets: 210 10*3/uL (ref 150–400)
RBC: 4.57 MIL/uL (ref 4.22–5.81)
RDW: 12.9 % (ref 11.5–15.5)
WBC: 8.5 10*3/uL (ref 4.0–10.5)

## 2010-08-04 LAB — TSH: TSH: 1.533 u[IU]/mL (ref 0.350–4.500)

## 2010-08-04 LAB — APTT: aPTT: 33 seconds (ref 24–37)

## 2010-08-04 LAB — PROTIME-INR
INR: 0.99 (ref 0.00–1.49)
Prothrombin Time: 13.3 seconds (ref 11.6–15.2)

## 2010-08-04 LAB — PRO B NATRIURETIC PEPTIDE: Pro B Natriuretic peptide (BNP): 64 pg/mL (ref 0–125)

## 2010-08-04 NOTE — Progress Notes (Signed)
HPI: 65 yo male for fu of CAD. Patient followed by Dr. Daleen Squibb. Patient underwent cath in April of 2011 secondary to SOB and was found to have a 95 LAD; LV function normal; patient had PCI of LAD at that time with DES. FU cath 5/11 secondary to dyspnea revealed no obstructive CAD. Chest CT revealed centrilobular emphysema and bilateral pleural plaques. Carotid dopplers 12-11 were normal. Patients presents today as an add on with complaints of dyspnea on exertion for the past one month. It occurs with minimal exertion and is relieved with rest. There is no orthopnea, PND, pedal edema or syncope. He also complains of chest tightness with exertion relieved with rest. It does not radiate. No nausea but there is diaphoresis. Occasional episodes at rest. Last episode one hour prior to arrival in the office. He also complains of bruising in his lower abdomen and legs for one month. No trauma.  Current Outpatient Prescriptions  Medication Sig Dispense Refill  . aspirin 325 MG tablet Take 325 mg by mouth daily.        Marland Kitchen EFFIENT 10 MG TABS TAKE ONE TABLET BY MOUTH ONE TIME DAILY  30 tablet  9  . simvastatin (ZOCOR) 40 MG tablet TAKE ONE TABLET BY MOUTH ONE TIME DAILY IN THE P.M.  30 tablet  10     Past Medical History  Diagnosis Date  . CAD (coronary artery disease)   . COPD (chronic obstructive pulmonary disease)   . GERD (gastroesophageal reflux disease)   . Barrett's esophageal ulceration   . BPH (benign prostatic hyperplasia)   . Adenomatous polyps   . Hyperlipidemia     Past Surgical History  Procedure Date  . Tonsillectomy   . Left knee surgery     History   Social History  . Marital Status: Divorced    Spouse Name: N/A    Number of Children: N/A  . Years of Education: N/A   Occupational History  . Not on file.   Social History Main Topics  . Smoking status: Former Smoker    Quit date: 02/06/1995  . Smokeless tobacco: Not on file  . Alcohol Use: Yes     Occasional  . Drug Use:  Not on file  . Sexually Active: Not on file   Other Topics Concern  . Not on file   Social History Narrative  . No narrative on file    ROS: Bruising but no fevers or chills, productive cough, hemoptysis, dysphasia, odynophagia, melena, hematochezia, dysuria, hematuria, rash, seizure activity, orthopnea, PND, pedal edema, claudication. Remaining systems are negative.  Physical Exam: Well-developed well-nourished in no acute distress.  Patient extremely anxious Back normal No clubbing Skin is warm and dry. Bruising noted over lower abdomen and upper thighs. HEENT is normal with normal eyelids.  Neck is supple. No thyromegaly. Nobruits. No JVD. Chest is clear to auscultation with normal expansion.  Cardiovascular exam is regular rate and rhythm. No murmurs rubs or gallops Abdominal exam nontender or distended. No masses palpated. No hepatosplenomegaly Extremities show no edema. neuro grossly intact  ECG NSR with no ST changes.

## 2010-08-04 NOTE — Telephone Encounter (Signed)
Pt aware of appt with Dr. Jens Som at 2pm Mylo Red RN

## 2010-08-04 NOTE — Telephone Encounter (Signed)
Pt calls yesterday with increasing shortness of breath for the past couple of days with some chest pain.  In speaking with him today he is less short of breath but does have increased abdominal swelling. He also reports significantly increased bruising on the abdomen and the back of his legs.  He weight maintains at 227.7 Dr. Daleen Squibb recommends he be seen today by DOD as pt has had false negative stress test in the past with shortness of breath.This lead  to subsequent cath with stent placement.  Please see office note from 06/2009 and 11/2009.   Mylo Red RN

## 2010-08-04 NOTE — Assessment & Plan Note (Signed)
If Hgb and Plt count normal, continue ASA  And effient; continue statin.

## 2010-08-04 NOTE — Telephone Encounter (Signed)
LMTCB Appt scheduled with DOD Dr. Jens Som for 2pm. Mylo Red RN

## 2010-08-04 NOTE — Assessment & Plan Note (Signed)
Patient complains of progressive exertional dyspnea and chest pain x1 month. Last episode occurred approximately one hour prior to being seen in the office. Plan admit and cycle enzymes. He is also complaining of bruising. I will check a CBC for hemoglobin and platelet count. If normal continue aspirin, effient and add IV heparin. Continue statin and add Toprol 25 mg daily. Proceed with cardiac catheterization on Monday. The risks and benefits including but not limited to stroke, myocardial infarction and death were discussed and he agrees to proceed.

## 2010-08-04 NOTE — Assessment & Plan Note (Signed)
Continue statin. 

## 2010-08-04 NOTE — Assessment & Plan Note (Signed)
Continue omeprazole 

## 2010-08-04 NOTE — Assessment & Plan Note (Signed)
Patient notes bruising over the lower abdomen and lower extremities with no trauma. Check hemoglobin and platelet count.

## 2010-08-04 NOTE — Assessment & Plan Note (Signed)
Previous chest CT revealed COPD. This may be contributing to his dyspnea.

## 2010-08-04 NOTE — Telephone Encounter (Signed)
Pt will be seen today at 2pm by dr Jens Som Deliah Goody

## 2010-08-05 LAB — LIPID PANEL
Cholesterol: 138 mg/dL (ref 0–200)
HDL: 39 mg/dL — ABNORMAL LOW (ref >39)
LDL Cholesterol: 73 mg/dL (ref 0–99)
Total CHOL/HDL Ratio: 3.5 RATIO
Triglycerides: 128 mg/dL (ref <150)
VLDL: 26 mg/dL (ref 0–40)

## 2010-08-05 LAB — CBC
HCT: 39.3 % (ref 39.0–52.0)
Hemoglobin: 13.6 g/dL (ref 13.0–17.0)
MCH: 29.1 pg (ref 26.0–34.0)
MCHC: 34.6 g/dL (ref 30.0–36.0)
MCV: 84 fL (ref 78.0–100.0)
Platelets: 203 10*3/uL (ref 150–400)
RBC: 4.68 MIL/uL (ref 4.22–5.81)
RDW: 13 % (ref 11.5–15.5)
WBC: 8.1 10*3/uL (ref 4.0–10.5)

## 2010-08-05 LAB — CARDIAC PANEL(CRET KIN+CKTOT+MB+TROPI)
CK, MB: 4.1 ng/mL — ABNORMAL HIGH (ref 0.3–4.0)
Relative Index: 4 — ABNORMAL HIGH (ref 0.0–2.5)
Total CK: 103 U/L (ref 7–232)
Troponin I: <0.3 ng/mL (ref <0.30)

## 2010-08-05 LAB — HEPARIN LEVEL (UNFRACTIONATED)
Heparin Unfractionated: 0.2 IU/mL — ABNORMAL LOW (ref 0.30–0.70)
Heparin Unfractionated: 0.37 IU/mL (ref 0.30–0.70)

## 2010-08-05 LAB — BASIC METABOLIC PANEL
BUN: 19 mg/dL (ref 6–23)
CO2: 25 mEq/L (ref 19–32)
Calcium: 9.1 mg/dL (ref 8.4–10.5)
Chloride: 106 mEq/L (ref 96–112)
Creatinine, Ser: 0.78 mg/dL (ref 0.50–1.35)
GFR calc Af Amer: >60 mL/min (ref >60)
GFR calc non Af Amer: >60 mL/min (ref >60)
Glucose, Bld: 99 mg/dL (ref 70–99)
Potassium: 3.8 mEq/L (ref 3.5–5.1)
Sodium: 140 mEq/L (ref 135–145)

## 2010-08-06 LAB — CBC
HCT: 40.6 % (ref 39.0–52.0)
Hemoglobin: 14.2 g/dL (ref 13.0–17.0)
MCH: 29.3 pg (ref 26.0–34.0)
MCHC: 35 g/dL (ref 30.0–36.0)
MCV: 83.7 fL (ref 78.0–100.0)
Platelets: 220 10*3/uL (ref 150–400)
RBC: 4.85 MIL/uL (ref 4.22–5.81)
RDW: 13 % (ref 11.5–15.5)
WBC: 8.3 10*3/uL (ref 4.0–10.5)

## 2010-08-06 LAB — CARDIAC PANEL(CRET KIN+CKTOT+MB+TROPI)
CK, MB: 3.3 ng/mL (ref 0.3–4.0)
Relative Index: INVALID (ref 0.0–2.5)
Total CK: 86 U/L (ref 7–232)
Troponin I: <0.3 ng/mL (ref <0.30)

## 2010-08-06 LAB — BASIC METABOLIC PANEL
BUN: 21 mg/dL (ref 6–23)
CO2: 25 mEq/L (ref 19–32)
Calcium: 9.1 mg/dL (ref 8.4–10.5)
Chloride: 105 mEq/L (ref 96–112)
Creatinine, Ser: 0.87 mg/dL (ref 0.50–1.35)
GFR calc Af Amer: >60 mL/min (ref >60)
GFR calc non Af Amer: >60 mL/min (ref >60)
Glucose, Bld: 101 mg/dL — ABNORMAL HIGH (ref 70–99)
Potassium: 3.9 mEq/L (ref 3.5–5.1)
Sodium: 138 mEq/L (ref 135–145)

## 2010-08-06 LAB — HEPARIN LEVEL (UNFRACTIONATED): Heparin Unfractionated: 0.41 IU/mL (ref 0.30–0.70)

## 2010-08-07 DIAGNOSIS — R079 Chest pain, unspecified: Secondary | ICD-10-CM

## 2010-08-07 LAB — BASIC METABOLIC PANEL
BUN: 20 mg/dL (ref 6–23)
CO2: 26 mEq/L (ref 19–32)
Calcium: 9.3 mg/dL (ref 8.4–10.5)
Chloride: 105 mEq/L (ref 96–112)
Creatinine, Ser: 0.99 mg/dL (ref 0.50–1.35)
GFR calc Af Amer: >60 mL/min (ref >60)
GFR calc non Af Amer: >60 mL/min (ref >60)
Glucose, Bld: 98 mg/dL (ref 70–99)
Potassium: 3.8 mEq/L (ref 3.5–5.1)
Sodium: 139 mEq/L (ref 135–145)

## 2010-08-07 LAB — CBC
HCT: 40.4 % (ref 39.0–52.0)
Hemoglobin: 13.9 g/dL (ref 13.0–17.0)
MCH: 29.1 pg (ref 26.0–34.0)
MCHC: 34.4 g/dL (ref 30.0–36.0)
MCV: 84.7 fL (ref 78.0–100.0)
Platelets: 216 10*3/uL (ref 150–400)
RBC: 4.77 MIL/uL (ref 4.22–5.81)
RDW: 13.1 % (ref 11.5–15.5)
WBC: 8.2 10*3/uL (ref 4.0–10.5)

## 2010-08-07 LAB — HEPARIN LEVEL (UNFRACTIONATED): Heparin Unfractionated: 0.27 IU/mL — ABNORMAL LOW (ref 0.30–0.70)

## 2010-08-07 LAB — POCT ACTIVATED CLOTTING TIME: Activated Clotting Time: 111 seconds

## 2010-08-08 ENCOUNTER — Encounter: Payer: Self-pay | Admitting: *Deleted

## 2010-08-15 NOTE — Cardiovascular Report (Signed)
  NAMEMarland Roman  BOBY, EYER NO.:  [id number]  MEDICAL RECORD NO.:  [id number]  LOCATION:  2029                         FACILITY:  [facility]  PHYSICIAN:  Noralyn Pick. [name], MD, FACCDATE OF BIRTH:  [birth year]  DATE OF PROCEDURE: DATE OF DISCHARGE:  08/07/2010                           CARDIAC CATHETERIZATION   A 65 year old patient with previous stent to the LAD, chest pain.  Cine catheterization done with 5-French catheter from right femoral artery.  Standard JL-4 and JR-4 catheters were used to engage coronaries.  The patient had a large left dominant coronary artery system.  Left main coronary artery was normal.  The stent in the proximal LAD was widely patent.  The mid and distal LAD were widely patent.  There was a large first diagonal branch which was normal.  The circumflex coronary artery was normal.  There were two large obtuse marginal branches and a PDA which were normal.  The right coronary artery was nondominant and normal.  RAO ventriculography:  RAO ventriculography showed mild anterior apical wall hypokinesis with an EF of 50%.  There was no gradient across the aortic valve and no MR.  LV pressure was 124/9, aortic pressure was 120/74.  IMPRESSION:  The patient does not have any restenosis of his left anterior descending artery.  His chest pain would appear to be noncardiac in etiology.  Continue risk factor modification is warranted. I believe this stent was placed in November 2011, and therefore he is still on Effient 10 mg a day.  He tolerated the procedure well.     Noralyn Pick. [name], MD, [facility]     PCN/MEDQ  D:  08/07/2010  T:  08/07/2010  Job:  [id number]  Electronically Signed by Charlton Haws MD [facility] on 08/15/2010 12:53:36 PM

## 2010-08-28 ENCOUNTER — Encounter: Payer: 59 | Admitting: Cardiology

## 2010-08-28 NOTE — Discharge Summary (Signed)
  NAMEMarland Kitchen  Tyrone Roman, Tyrone Roman NO.:  [id number]  MEDICAL RECORD NO.:  [id number]  LOCATION:  2029                         FACILITY:  [facility]  PHYSICIAN:  Noralyn Pick. [name], MD, FACCDATE OF BIRTH:  1945/06/01  DATE OF ADMISSION:  08/04/2010 DATE OF DISCHARGE:  08/07/2010                              DISCHARGE SUMMARY   PROCEDURES: 1. Cardiac catheterization. 2. Coronary arteriogram. 3. Left ventriculogram. 4. Portable chest x-ray.  PRIMARY FINAL DISCHARGE DIAGNOSIS:  Chest pain., no critical coronary artery disease at cath.  SECONDARY DIAGNOSES: 1. Status post drug-eluting stent to the left anterior descending     coronary artery in April 2011. 2. Benign prostatic hypertrophy. 3. Lumbar disk disease. 4. Gastroesophageal reflux disease/Barrett's esophagus. 5. Family history of coronary artery disease. 6. Hyperlipidemia. 7. Allergy or intolerance to CEPHALOSPORINS, CLARITHROMYCIN,     LIDOCAINE, and BIAFINE EMOLLIENT. 8. History of preserved left ventricular function. 9. Chronic obstructive pulmonary disease. 10.History of adenomatous polyps on colonoscopy. 11.Status post esophagogastroduodenoscopy with dilatation and left     knee surgery.  TIME AT DISCHARGE:  Thirty-two minutes.  HOSPITAL COURSE:  Tyrone Roman is a 65 year old male with a history of coronary artery disease.  He was seen in the office with chest pain and came to the hospital where he was admitted for further evaluation and treatment.  He also complained of some bruising in his lower abdomen, but both coags, platelets, and hemoglobin and hematocrit were within normal limits.  The bruising resolved.  He had some elevation in his CK-MBs, but troponins remained negative.  A lipid profile showed triglycerides 128, HDL 39, LDL 73.  He was taken to the cath lab by Dr. [name] on August 07, 2010.  The cardiac catheterization showed nonobstructive disease.  No further inpatient workup is indicated.  Tyrone Roman is considered stable for discharge, to follow up as an outpatient.  DISCHARGE INSTRUCTIONS:  His activity level is to be increased gradually.  He is to call our office for problems with the cath site. He is to stick to a low-sodium, heart-healthy diet.  He is to follow up with Dr. Daleen Squibb on September 08, 2010, at 8:30.  He is to follow up with Dr. Clent Ridges, and with Dr. Arlyce Dice, as needed.  DISCHARGE MEDICATIONS: 1. Tylenol 325 mg bedtime p.r.n. 2. Toprol-XL 25 mg a day. 3. Zocor 40 mg bedtime. 4. Multivitamins daily. 5. Sublingual nitroglycerin p.r.n. 6. Aspirin 81 mg a day. 7. Aspirin 325 mg a day is discontinued. 8. Effient 10 mg a day. 9. Omeprazole 40 mg a day. 10.Visine eyedrops daily.     Theodore Demark, PA-C   ______________________________ Noralyn Pick. [name], MD, [facility]    RB/MEDQ  D:  08/07/2010  T:  08/08/2010  Job:  [id number]  cc:   Dr. Clent Ridges Dr. Arlyce Dice  Electronically Signed by Theodore Demark PA-C on 08/22/2010 47:82:95 PM Electronically Signed by Charlton Haws MD [facility] on 08/28/2010 10:19:46 PM

## 2010-09-04 ENCOUNTER — Encounter: Payer: Self-pay | Admitting: Cardiology

## 2010-09-08 ENCOUNTER — Encounter: Payer: Self-pay | Admitting: *Deleted

## 2010-09-08 ENCOUNTER — Ambulatory Visit (INDEPENDENT_AMBULATORY_CARE_PROVIDER_SITE_OTHER): Payer: PRIVATE HEALTH INSURANCE | Admitting: Cardiology

## 2010-09-08 ENCOUNTER — Encounter: Payer: Self-pay | Admitting: Cardiology

## 2010-09-08 VITALS — BP 131/88 | HR 79 | Ht 74.0 in | Wt 233.0 lb

## 2010-09-08 DIAGNOSIS — R0600 Dyspnea, unspecified: Secondary | ICD-10-CM | POA: Insufficient documentation

## 2010-09-08 DIAGNOSIS — I2510 Atherosclerotic heart disease of native coronary artery without angina pectoris: Secondary | ICD-10-CM

## 2010-09-08 DIAGNOSIS — R0609 Other forms of dyspnea: Secondary | ICD-10-CM

## 2010-09-08 DIAGNOSIS — R0989 Other specified symptoms and signs involving the circulatory and respiratory systems: Secondary | ICD-10-CM

## 2010-09-08 DIAGNOSIS — R0602 Shortness of breath: Secondary | ICD-10-CM

## 2010-09-08 NOTE — Progress Notes (Signed)
HPI Tyrone Roman returns today for post catheter visit. His stent is widely patent and there is no other obstructive disease. His ejection fraction was normal.  He continues to have dyspnea on exertion which is really limiting him doing things he enjoys doing. He also has some bronchospasm component at times.  He has a history of GERD but says this is under good control. He has had an esophageal stricture but denies any dysphagia.  His history of nitric oxide lung injury and he smoked for a number of years. CT Scan of the chest in April of 2011 showed some plaquing perhaps consistent with asbestosis. He also has emphysema.  Past Medical History  Diagnosis Date  . CAD (coronary artery disease)   . COPD (chronic obstructive pulmonary disease)   . GERD (gastroesophageal reflux disease)   . Barrett's esophageal ulceration   . BPH (benign prostatic hyperplasia)   . Colonic polyp   . Hyperlipidemia   . SOB (shortness of breath)   . Dysphagia   . Pre-operative cardiovascular examination   . Contact with or exposure to venereal diseases   . Laceration of scalp   . Concussion with loss of consciousness of 30 minutes or less   . Nephrolithiasis   . Diverticulosis of colon   . Contact dermatitis     Past Surgical History  Procedure Date  . Tonsillectomy   . Left knee surgery   . Colonoscopy 03/01/09    per Dr. Arlyce Dice; had benign polyps, repeat 5 years   . Egd with dilatation 02/17/09    Barretts esophagus     Family History  Problem Relation Age of Onset  . Heart attack Father     cardiovascular disorder  . Arthritis      family hx  . Colon cancer Father   . Prostate cancer      1st degree relative    History   Social History  . Marital Status: Divorced    Spouse Name: N/A    Number of Children: N/A  . Years of Education: N/A   Occupational History  . Not on file.   Social History Main Topics  . Smoking status: Former Games developer  . Smokeless tobacco: Not on file   Comment: 1960's   . Alcohol Use: Yes     Occasional  . Drug Use: No  . Sexually Active: Not on file   Other Topics Concern  . 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     Allergies  Allergen Reactions  . Cephalexin   . Clarithromycin   . Keflex   . Lidoderm     Current Outpatient Prescriptions  Medication Sig Dispense Refill  . aspirin 81 MG tablet Take 81 mg by mouth daily.        Marland Kitchen EFFIENT 10 MG TABS TAKE ONE TABLET BY MOUTH ONE TIME DAILY  30 tablet  9  . metoprolol succinate (TOPROL-XL) 25 MG 24 hr tablet Take 1 tablet by mouth Daily.      . nitroGLYCERIN (NITROSTAT) 0.4 MG SL tablet Place 0.4 mg under the tongue every 5 (five) minutes as needed.        Marland Kitchen omeprazole (PRILOSEC) 40 MG capsule Take 40 mg by mouth daily.        . sildenafil (VIAGRA) 100 MG tablet Take 100 mg by mouth daily as needed.        . simvastatin (  ZOCOR) 40 MG tablet TAKE ONE TABLET BY MOUTH ONE TIME DAILY IN THE P.M.  30 tablet  10  . zolpidem (AMBIEN) 10 MG tablet Take 10 mg by mouth at bedtime as needed.          ROS Negative other than HPI.   PE  Filed Vitals:   09/08/10 1602  BP: 131/88  Pulse: 79  Height: 6\' 2"  (1.88 m)  Weight: 233 lb (105.688 kg)    EKG  Labs and Studies Reviewed.   Lab Results  Component Value Date   WBC 8.2 08/07/2010   HGB 13.9 08/07/2010   HCT 40.4 08/07/2010   MCV 84.7 08/07/2010   PLT 216 08/07/2010      Chemistry      Component Value Date/Time   NA 139 08/07/2010 0630   K 3.8 08/07/2010 0630   CL 105 08/07/2010 0630   CO2 26 08/07/2010 0630   BUN 20 08/07/2010 0630   CREATININE 0.99 08/07/2010 0630      Component Value Date/Time   CALCIUM 9.3 08/07/2010 0630   ALKPHOS 55 08/04/2010 1640   AST 21 08/04/2010 1640   ALT 20 08/04/2010 1640   BILITOT 0.4 08/04/2010 1640       Lab Results  Component Value Date   CHOL 138 08/05/2010   CHOL 141 01/26/2010   CHOL 141 06/21/2009     Lab Results  Component Value Date   HDL 39* 08/05/2010   HDL 34.60* 01/26/2010   HDL 36.60* 06/21/2009   Lab Results  Component Value Date   LDLCALC 73 08/05/2010   LDLCALC 73 01/26/2010   LDLCALC 81 06/21/2009   Lab Results  Component Value Date   TRIG 128 08/05/2010   TRIG 168.0* 01/26/2010   TRIG 116.0 06/21/2009   Lab Results  Component Value Date   CHOLHDL 3.5 08/05/2010   CHOLHDL 4 01/26/2010   CHOLHDL 4 06/21/2009   No results found for this basename: HGBA1C   Lab Results  Component Value Date   ALT 20 08/04/2010   AST 21 08/04/2010   ALKPHOS 55 08/04/2010   BILITOT 0.4 08/04/2010   Lab Results  Component Value Date   TSH 1.533 08/04/2010   Past Medical History  Diagnosis Date  . CAD (coronary artery disease)   . COPD (chronic obstructive pulmonary disease)   . GERD (gastroesophageal reflux disease)   . Barrett's esophageal ulceration   . BPH (benign prostatic hyperplasia)   . Colonic polyp   . Hyperlipidemia   . SOB (shortness of breath)   . Dysphagia   . Pre-operative cardiovascular examination   . Contact with or exposure to venereal diseases   . Laceration of scalp   . Concussion with loss of consciousness of 30 minutes or less   . Nephrolithiasis   . Diverticulosis of colon   . Contact dermatitis     Past Surgical History  Procedure Date  . Tonsillectomy   . Left knee surgery   . Colonoscopy 03/01/09    per Dr. Arlyce Dice; had benign polyps, repeat 5 years   . Egd with dilatation 02/17/09    Barretts esophagus     Family History  Problem Relation Age of Onset  . Heart attack Father     cardiovascular disorder  . Arthritis      family hx  . Colon cancer Father   . Prostate cancer      1st degree relative    History   Social History  .  Marital Status: Divorced    Spouse Name: N/A    Number of Children: N/A  . Years of Education: N/A   Occupational History  . Not on file.   Social History Main Topics  . Smoking status: Former Games developer   . Smokeless tobacco: Not on file   Comment: 1960's   . Alcohol Use: Yes     Occasional  . Drug Use: No  . Sexually Active: Not on file   Other Topics Concern  . 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     Allergies  Allergen Reactions  . Cephalexin   . Clarithromycin   . Keflex   . Lidoderm     Current Outpatient Prescriptions  Medication Sig Dispense Refill  . aspirin 81 MG tablet Take 81 mg by mouth daily.        Marland Kitchen EFFIENT 10 MG TABS TAKE ONE TABLET BY MOUTH ONE TIME DAILY  30 tablet  9  . metoprolol succinate (TOPROL-XL) 25 MG 24 hr tablet Take 1 tablet by mouth Daily.      . nitroGLYCERIN (NITROSTAT) 0.4 MG SL tablet Place 0.4 mg under the tongue every 5 (five) minutes as needed.        Marland Kitchen omeprazole (PRILOSEC) 40 MG capsule Take 40 mg by mouth daily.        . sildenafil (VIAGRA) 100 MG tablet Take 100 mg by mouth daily as needed.        . simvastatin (ZOCOR) 40 MG tablet TAKE ONE TABLET BY MOUTH ONE TIME DAILY IN THE P.M.  30 tablet  10  . zolpidem (AMBIEN) 10 MG tablet Take 10 mg by mouth at bedtime as needed.          ROS Negative other than HPI.   PE General Appearance: well developed, well nourished in no acute distress HEENT: symmetrical face, PERRLA, good dentition  Neck: no JVD, thyromegaly, or adenopathy, trachea midline Chest: symmetric without deformity Cardiac: PMI non-displaced, RRR, normal S1, S2, no gallop or murmur Lung: clear to ausculation and percussion Vascular: all pulses full without bruits  Abdominal: nondistended, nontender, good bowel sounds, no HSM, no bruits Extremities: no cyanosis, clubbing or edema, no sign of DVT, no varicosities  Skin: normal color, no rashes Neuro: alert and oriented x 3, non-focal Pysch: normal affect Filed Vitals:   09/08/10 1602  BP: 131/88  Pulse: 79  Height: 6\' 2"  (1.88 m)  Weight: 233 lb (105.688 kg)     EKG  Labs and Studies Reviewed.   Lab Results  Component Value Date   WBC 8.2 08/07/2010   HGB 13.9 08/07/2010   HCT 40.4 08/07/2010   MCV 84.7 08/07/2010   PLT 216 08/07/2010      Chemistry      Component Value Date/Time   NA 139 08/07/2010 0630   K 3.8 08/07/2010 0630   CL 105 08/07/2010 0630   CO2 26 08/07/2010 0630   BUN 20 08/07/2010 0630   CREATININE 0.99 08/07/2010 0630      Component Value Date/Time   CALCIUM 9.3 08/07/2010 0630   ALKPHOS 55 08/04/2010 1640   AST 21 08/04/2010 1640   ALT 20 08/04/2010 1640   BILITOT 0.4 08/04/2010 1640       Lab Results  Component Value Date   CHOL 138 08/05/2010   CHOL 141 01/26/2010   CHOL 141 06/21/2009   Lab Results  Component Value Date   HDL 39* 08/05/2010   HDL 34.60* 01/26/2010   HDL 36.60* 06/21/2009   Lab Results  Component Value Date   LDLCALC 73 08/05/2010   LDLCALC 73 01/26/2010   LDLCALC 81 06/21/2009   Lab Results  Component Value Date   TRIG 128 08/05/2010   TRIG 168.0* 01/26/2010   TRIG 116.0 06/21/2009   Lab Results  Component Value Date   CHOLHDL 3.5 08/05/2010   CHOLHDL 4 01/26/2010   CHOLHDL 4 06/21/2009   No results found for this basename: HGBA1C   Lab Results  Component Value Date   ALT 20 08/04/2010   AST 21 08/04/2010   ALKPHOS 55 08/04/2010   BILITOT 0.4 08/04/2010   Lab Results  Component Value Date   TSH 1.533 08/04/2010

## 2010-09-08 NOTE — Assessment & Plan Note (Signed)
Stable

## 2010-09-08 NOTE — Patient Instructions (Signed)
You have been referred to Dr. Vassie Loll for your shortness of breath  Your physician recommends that you schedule a follow-up appointment in: 1 year with Dr. Daleen Squibb

## 2010-10-13 ENCOUNTER — Encounter: Payer: Self-pay | Admitting: Pulmonary Disease

## 2010-10-13 ENCOUNTER — Ambulatory Visit (INDEPENDENT_AMBULATORY_CARE_PROVIDER_SITE_OTHER): Payer: PRIVATE HEALTH INSURANCE | Admitting: Pulmonary Disease

## 2010-10-13 VITALS — BP 112/78 | HR 74 | Temp 97.8°F | Ht 74.0 in | Wt 242.6 lb

## 2010-10-13 DIAGNOSIS — R0600 Dyspnea, unspecified: Secondary | ICD-10-CM

## 2010-10-13 DIAGNOSIS — J449 Chronic obstructive pulmonary disease, unspecified: Secondary | ICD-10-CM

## 2010-10-13 DIAGNOSIS — R0609 Other forms of dyspnea: Secondary | ICD-10-CM

## 2010-10-13 DIAGNOSIS — R0989 Other specified symptoms and signs involving the circulatory and respiratory systems: Secondary | ICD-10-CM

## 2010-10-13 NOTE — Patient Instructions (Signed)
Full pFTs - breathing test If OK, will proceed with CT scan to look for blood clots

## 2010-10-13 NOTE — Progress Notes (Signed)
  Subjective:    Patient ID: Tyrone Roman, male    DOB: [birth year], 65 y.o.   MRN: [id number]  HPI 65/M, ex smoker for evaluation of  dyspnea on exertion x 1 yr This improved after cardiac stent ,then started again few months ago Per last cardiac evaluation his stent is widely patent and there is no other obstructive disease. His ejection fraction was normal.  He continues to have dyspnea on exertion which is really limiting him doing things he enjoys doing such as riding his bike. He has a history of GERD but says this is under good control. He has had an esophageal stricture but denies any dysphagia.  He reports an episode of nitric oxide lung injury in the 70's due to exposure in the lab. 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 Also walked pt up 2 flights of stairs and down 2 fligts, o2 98%RA on 1st, 93%RA on 2nd and 98%RA at end. After walkings stairs pt c/o lightheadedness, chest tightness, and SOB.    Review of Systems  Constitutional: Negative for fever, appetite change and unexpected weight change.  HENT: Positive for sneezing. Negative for ear pain, congestion, sore throat, rhinorrhea, trouble swallowing, dental problem and postnasal drip.   Eyes: Positive for redness and itching.  Respiratory: Positive for shortness of breath. Negative for cough and wheezing.   Cardiovascular: Positive for chest pain and leg swelling. Negative for palpitations.  Gastrointestinal: Negative for nausea, vomiting, abdominal pain and diarrhea.  Genitourinary: Positive for urgency. Negative for dysuria.  Musculoskeletal: Positive for joint swelling.       Ankles and feet  Skin: Negative for rash.  Neurological: Positive for headaches. Negative for syncope.  Hematological: Bruises/bleeds  easily.  Psychiatric/Behavioral: Negative for dysphoric mood. The patient is not nervous/anxious.        Objective:   Physical Exam Gen. Pleasant, well-nourished, in no distress, normal affect ENT - no lesions, no post nasal drip Neck: No JVD, no thyromegaly, no carotid bruits Lungs: no use of accessory muscles, no dullness to percussion, clear without rales or rhonchi  Cardiovascular: Rhythm regular, heart sounds  normal, no murmurs or gallops, no peripheral edema Abdomen: soft and non-tender, no hepatosplenomegaly, BS normal. Musculoskeletal: No deformities, no cyanosis or clubbing Neuro:  alert, non focal        Assessment & Plan:

## 2010-10-16 NOTE — Assessment & Plan Note (Signed)
Obtain PFTs Risk factor for PE seems to be his frequent flying - obtain CT angio if PFts do not explain his dyspnea If both tests neg, may have to attribute to deconditioning -

## 2010-11-13 ENCOUNTER — Ambulatory Visit (INDEPENDENT_AMBULATORY_CARE_PROVIDER_SITE_OTHER): Payer: PRIVATE HEALTH INSURANCE | Admitting: Pulmonary Disease

## 2010-11-13 DIAGNOSIS — J449 Chronic obstructive pulmonary disease, unspecified: Secondary | ICD-10-CM

## 2010-11-13 DIAGNOSIS — J4489 Other specified chronic obstructive pulmonary disease: Secondary | ICD-10-CM

## 2010-11-13 LAB — PULMONARY FUNCTION TEST

## 2010-11-13 NOTE — Progress Notes (Signed)
PFT done today. 

## 2010-11-16 ENCOUNTER — Ambulatory Visit: Payer: PRIVATE HEALTH INSURANCE

## 2010-11-16 ENCOUNTER — Encounter: Payer: Self-pay | Admitting: Pulmonary Disease

## 2010-11-16 ENCOUNTER — Ambulatory Visit (INDEPENDENT_AMBULATORY_CARE_PROVIDER_SITE_OTHER): Payer: PRIVATE HEALTH INSURANCE | Admitting: Pulmonary Disease

## 2010-11-16 VITALS — BP 110/68 | HR 104 | Temp 98.0°F | Ht 74.0 in | Wt 240.6 lb

## 2010-11-16 DIAGNOSIS — R0989 Other specified symptoms and signs involving the circulatory and respiratory systems: Secondary | ICD-10-CM

## 2010-11-16 DIAGNOSIS — R0600 Dyspnea, unspecified: Secondary | ICD-10-CM

## 2010-11-16 DIAGNOSIS — R0609 Other forms of dyspnea: Secondary | ICD-10-CM

## 2010-11-16 LAB — BASIC METABOLIC PANEL
BUN: 23 mg/dL (ref 6–23)
CO2: 27 mEq/L (ref 19–32)
Calcium: 9.2 mg/dL (ref 8.4–10.5)
Chloride: 108 mEq/L (ref 96–112)
Creatinine, Ser: 1.2 mg/dL (ref 0.4–1.5)
GFR: 65.75 mL/min (ref >60.00)
Glucose, Bld: 85 mg/dL (ref 70–99)
Potassium: 3.8 mEq/L (ref 3.5–5.1)
Sodium: 141 mEq/L (ref 135–145)

## 2010-11-16 NOTE — Progress Notes (Signed)
  Subjective:    Patient ID: Tyrone Roman, male    DOB: 01-Jul-1945, 65 y.o.   MRN: [id number]  HPI 65/M, ex smoker for evaluation of dyspnea on exertion x 1 yr  This improved after cardiac stent ,then started again few months ago  Per last cardiac evaluation his stent is widely patent and there is no other obstructive disease. His ejection fraction was normal.  He continues to have dyspnea on exertion which is really limiting him doing things he enjoys doing such as riding his bike. He has a history of GERD but says this is under good control on daily omeprazole. He had an esophageal stricture but denies any dysphagia.  He reports an episode of nitric oxide lung injury in the 70's due to exposure in the lab. 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 Also walked pt up 2 flights of stairs and down 2 fligts, o2 98%RA on 1st, 93%RA on 2nd and 98%RA at end. After walkings stairs pt c/o lightheadedness, chest tightness, and SOB.  >> PFTs 11/13/10 show no evidence of airway obstruction or restriction. DLCO is 83%. He reports bilateral sub costal chest tightness associated with dyspnea.   Review of Systems Patient denies significantcough, hemoptysis,  chest pain, palpitations, pedal edema, orthopnea, paroxysmal nocturnal dyspnea, lightheadedness, nausea, vomiting, abdominal or  leg pains      Objective:   Physical Exam Gen. Pleasant, well-nourished, in no distress ENT - no lesions, no post nasal drip Neck: No JVD, no thyromegaly, no carotid bruits Lungs: no use of accessory muscles, no dullness to percussion, clear without rales or rhonchi  Cardiovascular: Rhythm regular, heart sounds  normal, no murmurs or gallops, no peripheral edema Musculoskeletal: No deformities, no cyanosis or  clubbing         Assessment & Plan:

## 2010-11-16 NOTE — Patient Instructions (Signed)
CT scan to look for blood clots

## 2010-11-17 ENCOUNTER — Ambulatory Visit: Payer: PRIVATE HEALTH INSURANCE | Admitting: Pulmonary Disease

## 2010-11-17 NOTE — Assessment & Plan Note (Signed)
No  Reason is apparent on PFTs. No airway obstruction or restriction. Although CT chest in the past showed some changes of emphysema, this does not seem to be significant. He does not seem to have any sequelae from his chemical injury. I could not locate an echo to see if he has pulmonary hypertension I will obtain CT angio to r/o PE given that he is a frequent flier - but that seems to be his only risk factor. I offerred him a graduated exercise program such as cardiac rehab to improve his conditioning. While vocal cord dysfunction could be an alternative diagnosis, he does not seem to have significant anxiety & I discussed abdominal breathing & other techniques. If the CT angio is non diagnostic, an exercise  stress test perhaps would be indicated. But he did indicate to me that he was tired of tests.

## 2010-11-27 ENCOUNTER — Ambulatory Visit (INDEPENDENT_AMBULATORY_CARE_PROVIDER_SITE_OTHER)
Admission: RE | Admit: 2010-11-27 | Discharge: 2010-11-27 | Disposition: A | Discharge status: Home / Self Care | Payer: PRIVATE HEALTH INSURANCE | Source: Ambulatory Visit | Attending: Pulmonary Disease | Admitting: Pulmonary Disease

## 2010-11-27 DIAGNOSIS — R0600 Dyspnea, unspecified: Secondary | ICD-10-CM

## 2010-11-27 DIAGNOSIS — R0989 Other specified symptoms and signs involving the circulatory and respiratory systems: Secondary | ICD-10-CM

## 2010-11-27 DIAGNOSIS — R0609 Other forms of dyspnea: Secondary | ICD-10-CM

## 2010-11-27 MED ORDER — IOHEXOL 300 MG/ML  SOLN
80.0000 mL | Freq: Once | INTRAMUSCULAR | Status: AC | PRN
  Administered 2010-11-27: 80 mL via INTRAVENOUS

## 2010-12-07 ENCOUNTER — Telehealth: Payer: Self-pay | Admitting: Pulmonary Disease

## 2010-12-07 NOTE — Telephone Encounter (Signed)
Called and spoke with pt and informed him of CT results.  Pt verbalized understanding and wanted to know what next steps would be regarding his dyspnea.  Wanted to know if RA would like to do any more testing.  RA, please advise.  Thanks.

## 2010-12-07 NOTE — Telephone Encounter (Signed)
An exercise stress test can sometimes help to differntiate between causes of dyspnea- heart, lungs or muscle. Otherwise he can go through a rehab program & see if there is benefit.

## 2010-12-08 NOTE — Telephone Encounter (Signed)
Spoke with pt and notified of recs and he states that he has already had exercise stress test and already went through rehab program. Will forward to RA so that he is aware.

## 2011-01-01 ENCOUNTER — Other Ambulatory Visit: Payer: Self-pay | Admitting: Family Medicine

## 2011-03-23 ENCOUNTER — Other Ambulatory Visit: Payer: Self-pay | Admitting: Family Medicine

## 2011-03-27 ENCOUNTER — Telehealth: Payer: Self-pay | Admitting: Family Medicine

## 2011-03-27 MED ORDER — SILDENAFIL CITRATE 100 MG PO TABS: 100.0000 mg | ORAL_TABLET | Freq: Every day | ORAL | Status: DC | PRN

## 2011-03-27 NOTE — Telephone Encounter (Signed)
Script sent e-scribe 

## 2011-03-27 NOTE — Telephone Encounter (Signed)
Call in #10 with 11 rf 

## 2011-03-27 NOTE — Telephone Encounter (Signed)
Refill request for Viagra 100 mg prn.

## 2011-04-02 ENCOUNTER — Other Ambulatory Visit: Payer: Self-pay | Admitting: Internal Medicine

## 2011-04-02 ENCOUNTER — Other Ambulatory Visit: Payer: Self-pay

## 2011-04-02 MED ORDER — PRASUGREL HCL 10 MG PO TABS: 10.0000 mg | ORAL_TABLET | Freq: Every day | ORAL | Status: DC

## 2011-04-05 ENCOUNTER — Ambulatory Visit (INDEPENDENT_AMBULATORY_CARE_PROVIDER_SITE_OTHER): Payer: 59 | Admitting: Family Medicine

## 2011-04-05 ENCOUNTER — Encounter: Payer: Self-pay | Admitting: Family Medicine

## 2011-04-05 VITALS — BP 128/80 | HR 102 | Temp 97.5°F | Wt 245.0 lb

## 2011-04-05 DIAGNOSIS — L309 Dermatitis, unspecified: Secondary | ICD-10-CM

## 2011-04-05 DIAGNOSIS — N401 Enlarged prostate with lower urinary tract symptoms: Secondary | ICD-10-CM

## 2011-04-05 DIAGNOSIS — N138 Other obstructive and reflux uropathy: Secondary | ICD-10-CM

## 2011-04-05 DIAGNOSIS — N139 Obstructive and reflux uropathy, unspecified: Secondary | ICD-10-CM

## 2011-04-05 DIAGNOSIS — L259 Unspecified contact dermatitis, unspecified cause: Secondary | ICD-10-CM

## 2011-04-05 DIAGNOSIS — N529 Male erectile dysfunction, unspecified: Secondary | ICD-10-CM

## 2011-04-05 MED ORDER — TAMSULOSIN HCL 0.4 MG PO CAPS: 0.4000 mg | ORAL_CAPSULE | Freq: Every day | ORAL | Status: DC

## 2011-04-05 MED ORDER — SILDENAFIL CITRATE 100 MG PO TABS: 100.0000 mg | ORAL_TABLET | Freq: Every day | ORAL | Status: DC | PRN

## 2011-04-05 MED ORDER — TRIAMCINOLONE ACETONIDE 0.5 % EX CREA: TOPICAL_CREAM | Freq: Three times a day (TID) | CUTANEOUS | Status: DC

## 2011-04-05 NOTE — Progress Notes (Signed)
  Subjective:    Patient ID: Tyrone Roman, male    DOB: 12-11-1945, 66 y.o.   MRN: [id number]  HPI Here for several reasons. First he needs refills on Viagra. This works well for him. We had sent in a refill recently but the pharmacy said they did not get this. Also he has had itchy rashes for the past month. These began around both lower legs and have spread to the trunk and arms. OTC cortisone helps but not for long. Also he had used Flomax in the past with good results and he wants to get back on this. He has urinary urgency and takes a long time to empty his bladder. No burning or pain.    Review of Systems  Constitutional: Negative.   Respiratory: Negative.   Cardiovascular: Negative.   Genitourinary: Positive for urgency and frequency. Negative for dysuria and flank pain.  Skin: Positive for rash.       Objective:   Physical Exam  Constitutional: He appears well-developed and well-nourished.  Neck: No thyromegaly present.  Cardiovascular: Normal rate, regular rhythm, normal heart sounds and intact distal pulses.   Pulmonary/Chest: Effort normal and breath sounds normal.  Lymphadenopathy:    He has no cervical adenopathy.  Skin:       Diffuse small papules or patches of red scaly skin           Assessment & Plan:  Treat the eczema with Triamcinolone cream. Add a Zyrtec daily for itching. Refilled Viagra and Flomax.

## 2011-04-24 ENCOUNTER — Telehealth: Payer: Self-pay | Admitting: Pulmonary Disease

## 2011-04-24 NOTE — Telephone Encounter (Signed)
Dr. Vassie Loll, have you seen any paperwork on this pt. Please advise thanks

## 2011-04-24 NOTE — Telephone Encounter (Signed)
no

## 2011-04-24 NOTE — Telephone Encounter (Signed)
I spoke with [facility] and was advised they had faxed over a medical record requests regarding 2 patients. I advised them they would need to contact our medical records fort [place] information. Nothing further was needed

## 2011-06-03 ENCOUNTER — Other Ambulatory Visit: Payer: Self-pay | Admitting: Cardiology

## 2011-06-29 ENCOUNTER — Other Ambulatory Visit: Payer: Self-pay | Admitting: Cardiology

## 2011-06-29 NOTE — Telephone Encounter (Signed)
..   Requested Prescriptions   Pending Prescriptions Disp Refills  . EFFIENT 10 MG TABS [Pharmacy Med Name: EFFIENT 10 MG       TAB LILL]  1    Sig: TAKE ONE TABLET BY MOUTH ONE TIME DAILY

## 2011-07-01 ENCOUNTER — Other Ambulatory Visit: Payer: Self-pay | Admitting: Family Medicine

## 2011-08-06 ENCOUNTER — Other Ambulatory Visit: Payer: Self-pay | Admitting: *Deleted

## 2011-08-06 ENCOUNTER — Other Ambulatory Visit: Payer: Self-pay | Admitting: Cardiovascular Disease

## 2011-08-06 NOTE — Telephone Encounter (Signed)
Opened in Error.

## 2011-10-22 ENCOUNTER — Encounter: Payer: Self-pay | Admitting: Gastroenterology

## 2011-10-25 ENCOUNTER — Ambulatory Visit (INDEPENDENT_AMBULATORY_CARE_PROVIDER_SITE_OTHER): Payer: 59 | Admitting: Cardiology

## 2011-10-25 ENCOUNTER — Encounter: Payer: Self-pay | Admitting: Cardiology

## 2011-10-25 VITALS — BP 132/78 | HR 78 | Ht 74.0 in | Wt 237.0 lb

## 2011-10-25 DIAGNOSIS — I2510 Atherosclerotic heart disease of native coronary artery without angina pectoris: Secondary | ICD-10-CM

## 2011-10-25 DIAGNOSIS — E785 Hyperlipidemia, unspecified: Secondary | ICD-10-CM

## 2011-10-25 NOTE — Progress Notes (Signed)
HPI [name] comes in today for the evaluation and management of his history of coronary artery disease and PCI. His chronic dyspnea on exertion has not changed. A CT angiogram of the chest was negative except for pleural plaques which were stable.  He has had one episode of sharp stabbing pain well localized above the left breast. He felt funny in his left arm. This was at rest. He has had no exertional chest discomfort or anginal symptoms.  He is currently retired. He is compliant with his medications.  Past Medical History  Diagnosis Date  . CAD (coronary artery disease)     sees Dr. Juanito Doom  . GERD (gastroesophageal reflux disease)   . Barrett's esophageal ulceration   . BPH (benign prostatic hyperplasia)   . Colonic polyp   . Hyperlipidemia   . SOB (shortness of breath)   . Dysphagia   . Pre-operative cardiovascular examination   . Contact with or exposure to venereal diseases   . Laceration of scalp   . Concussion with loss of consciousness of 30 minutes or less   . Nephrolithiasis   . Diverticulosis of colon   . Contact dermatitis   . COPD (chronic obstructive pulmonary disease)     sees Dr. Cyril Mourning     Current Outpatient Prescriptions  Medication Sig Dispense Refill  . aspirin 81 MG tablet Take 81 mg by mouth daily.        Marland Kitchen EFFIENT 10 MG TABS TAKE ONE TABLET BY MOUTH ONE TIME DAILY  30 tablet  3  . fexofenadine (ALLEGRA) 180 MG tablet Take 180 mg by mouth daily.      . metoprolol succinate (TOPROL-XL) 25 MG 24 hr tablet TAKE ONE TABLET BY MOUTH ONE TIME DAILY  30 tablet  10  . omeprazole (PRILOSEC) 40 MG capsule TAKE ONE CAPSULE BY MOUTH ONE TIME DAILY  30 capsule  11  . sildenafil (VIAGRA) 100 MG tablet Take 1 tablet (100 mg total) by mouth daily as needed.  10 tablet  11  . simvastatin (ZOCOR) 40 MG tablet TAKE ONE TABLET BY MOUTH ONE TIME DAILY IN THE P.M.  30 tablet  6  . Tamsulosin HCl (FLOMAX) 0.4 MG CAPS Take 1 capsule (0.4 mg total) by mouth daily.  30  capsule  11  . zolpidem (AMBIEN) 10 MG tablet Take 10 mg by mouth at bedtime as needed.        . nitroGLYCERIN (NITROSTAT) 0.4 MG SL tablet Place 0.4 mg under the tongue every 5 (five) minutes as needed.        . triamcinolone cream (KENALOG) 0.5 % Apply topically 3 (three) times daily.  30 g  11    Allergies  Allergen Reactions  . Cephalexin   . Cephalexin   . Clarithromycin   . Lidoderm     Family History  Problem Relation Age of Onset  . Heart attack Father     cardiovascular disorder  . Arthritis      family hx  . Colon cancer Father   . Prostate cancer      1st degree relative    History   Social History  . Marital Status: Divorced    Spouse Name: N/A    Number of Children: N/A  . Years of Education: N/A   Occupational History  . Not on file.   Social History Main Topics  . Smoking status: Former Smoker -- 1.0 packs/day for 20 years    Types: Cigarettes  Quit date: 10/12/1988  . Smokeless tobacco: Never Used   Comment: 1960's   . Alcohol Use: Yes     twice a month  . Drug Use: No  . Sexually Active: Not on file   Other Topics Concern  . 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     ROS ALL NEGATIVE EXCEPT THOSE NOTED IN HPI  PE  General Appearance: well developed, well nourished in no acute distress HEENT: symmetrical face, PERRLA, good dentition  Neck: no JVD, thyromegaly, or adenopathy, trachea midline Chest: symmetric without deformity Cardiac: PMI non-displaced, RRR, normal S1, S2, no gallop or murmur Lung: clear to ausculation and percussion Vascular: all pulses full without bruits  Abdominal: nondistended, nontender, good bowel sounds, no HSM, no bruits Extremities: no cyanosis, clubbing or edema, no sign of DVT, no varicosities  Skin: normal color, no rashes Neuro: alert and oriented x 3, non-focal Pysch: normal affect  EKG Normal sinus rhythm,  normal EKG BMET    Component Value Date/Time   NA 141 11/16/2010 1656   K 3.8 11/16/2010 1656   CL 108 11/16/2010 1656   CO2 27 11/16/2010 1656   GLUCOSE 85 11/16/2010 1656   BUN 23 11/16/2010 1656   CREATININE 1.2 11/16/2010 1656   CALCIUM 9.2 11/16/2010 1656   GFRNONAA >60 08/07/2010 0630   GFRAA >60 08/07/2010 0630    Lipid Panel     Component Value Date/Time   CHOL 138 08/05/2010 0400   TRIG 128 08/05/2010 0400   HDL 39* 08/05/2010 0400   CHOLHDL 3.5 08/05/2010 0400   VLDL 26 08/05/2010 0400   LDLCALC 73 08/05/2010 0400    CBC    Component Value Date/Time   WBC 8.2 08/07/2010 0630   RBC 4.77 08/07/2010 0630   HGB 13.9 08/07/2010 0630   HCT 40.4 08/07/2010 0630   PLT 216 08/07/2010 0630   MCV 84.7 08/07/2010 0630   MCH 29.1 08/07/2010 0630   MCHC 34.4 08/07/2010 0630   RDW 13.1 08/07/2010 0630   LYMPHSABS 1.7 01/26/2010 0915   MONOABS 0.7 01/26/2010 0915   EOSABS 0.6 01/26/2010 0915   BASOSABS 0.0 01/26/2010 0915

## 2011-10-25 NOTE — Patient Instructions (Addendum)
Your physician recommends that you continue on your current medications as directed. Please refer to the Current Medication list given to you today.   Your physician wants you to follow-up in: 1 year with Dr. Wall. You will receive a reminder letter in the mail two months in advance. If you don't receive a letter, please call our office to schedule the follow-up appointment.  

## 2011-10-25 NOTE — Assessment & Plan Note (Signed)
Stable. Continue secondary preventative therapy. Return the office in one year. 

## 2011-11-01 ENCOUNTER — Other Ambulatory Visit: Payer: Self-pay | Admitting: Cardiology

## 2011-11-14 ENCOUNTER — Encounter: Payer: Self-pay | Admitting: Gastroenterology

## 2011-11-15 ENCOUNTER — Other Ambulatory Visit (INDEPENDENT_AMBULATORY_CARE_PROVIDER_SITE_OTHER): Payer: 59

## 2011-11-15 DIAGNOSIS — Z0000 Encounter for general adult medical examination without abnormal findings: Secondary | ICD-10-CM

## 2011-11-15 LAB — BASIC METABOLIC PANEL
BUN: 14 mg/dL (ref 6–23)
CO2: 26 mEq/L (ref 19–32)
Calcium: 9.2 mg/dL (ref 8.4–10.5)
Chloride: 107 mEq/L (ref 96–112)
Creatinine, Ser: 1 mg/dL (ref 0.4–1.5)
GFR: 83.17 mL/min (ref >60.00)
Glucose, Bld: 113 mg/dL — ABNORMAL HIGH (ref 70–99)
Potassium: 4.4 mEq/L (ref 3.5–5.1)
Sodium: 139 mEq/L (ref 135–145)

## 2011-11-15 LAB — POCT URINALYSIS DIPSTICK
Bilirubin, UA: NEGATIVE
Blood, UA: NEGATIVE
Glucose, UA: NEGATIVE
Ketones, UA: NEGATIVE
Leukocytes, UA: NEGATIVE
Nitrite, UA: NEGATIVE
Protein, UA: NEGATIVE
Spec Grav, UA: 1.02
Urobilinogen, UA: 1
pH, UA: 7

## 2011-11-15 LAB — CBC WITH DIFFERENTIAL/PLATELET
Basophils Absolute: 0 10*3/uL (ref 0.0–0.1)
Basophils Relative: 0.7 % (ref 0.0–3.0)
Eosinophils Absolute: 0.6 10*3/uL (ref 0.0–0.7)
Eosinophils Relative: 8.1 % — ABNORMAL HIGH (ref 0.0–5.0)
HCT: 40.8 % (ref 39.0–52.0)
Hemoglobin: 13.4 g/dL (ref 13.0–17.0)
Lymphocytes Relative: 20.4 % (ref 12.0–46.0)
Lymphs Abs: 1.5 10*3/uL (ref 0.7–4.0)
MCHC: 32.9 g/dL (ref 30.0–36.0)
MCV: 89 fl (ref 78.0–100.0)
Monocytes Absolute: 0.6 10*3/uL (ref 0.1–1.0)
Monocytes Relative: 8.6 % (ref 3.0–12.0)
Neutro Abs: 4.7 10*3/uL (ref 1.4–7.7)
Neutrophils Relative %: 62.2 % (ref 43.0–77.0)
Platelets: 188 10*3/uL (ref 150.0–400.0)
RBC: 4.59 Mil/uL (ref 4.22–5.81)
RDW: 13.3 % (ref 11.5–14.6)
WBC: 7.5 10*3/uL (ref 4.5–10.5)

## 2011-11-15 LAB — HEPATIC FUNCTION PANEL
ALT: 20 U/L (ref 0–53)
AST: 22 U/L (ref 0–37)
Albumin: 3.5 g/dL (ref 3.5–5.2)
Alkaline Phosphatase: 45 U/L (ref 39–117)
Bilirubin, Direct: 0.2 mg/dL (ref 0.0–0.3)
Total Bilirubin: 0.8 mg/dL (ref 0.3–1.2)
Total Protein: 6.5 g/dL (ref 6.0–8.3)

## 2011-11-15 LAB — LIPID PANEL
Cholesterol: 121 mg/dL (ref 0–200)
HDL: 36.9 mg/dL — ABNORMAL LOW (ref >39.00)
LDL Cholesterol: 70 mg/dL (ref 0–99)
Total CHOL/HDL Ratio: 3
Triglycerides: 69 mg/dL (ref 0.0–149.0)
VLDL: 13.8 mg/dL (ref 0.0–40.0)

## 2011-11-15 LAB — TSH: TSH: 2.04 u[IU]/mL (ref 0.35–5.50)

## 2011-11-15 LAB — PSA: PSA: 1.41 ng/mL (ref 0.10–4.00)

## 2011-11-16 NOTE — Progress Notes (Signed)
Quick Note:  I spoke with pt ______ 

## 2011-11-27 ENCOUNTER — Ambulatory Visit (INDEPENDENT_AMBULATORY_CARE_PROVIDER_SITE_OTHER): Payer: 59 | Admitting: Family Medicine

## 2011-11-27 ENCOUNTER — Encounter: Payer: Self-pay | Admitting: Family Medicine

## 2011-11-27 VITALS — BP 120/80 | HR 84 | Temp 97.8°F | Ht 73.0 in | Wt 230.0 lb

## 2011-11-27 DIAGNOSIS — M25562 Pain in left knee: Secondary | ICD-10-CM

## 2011-11-27 DIAGNOSIS — Z0000 Encounter for general adult medical examination without abnormal findings: Secondary | ICD-10-CM

## 2011-11-27 DIAGNOSIS — M25569 Pain in unspecified knee: Secondary | ICD-10-CM

## 2011-11-27 MED ORDER — SIMVASTATIN 40 MG PO TABS: 40.0000 mg | ORAL_TABLET | Freq: Every day | ORAL | Status: DC

## 2011-11-27 MED ORDER — OMEPRAZOLE 40 MG PO CPDR: 40.0000 mg | DELAYED_RELEASE_CAPSULE | Freq: Every day | ORAL | Status: DC

## 2011-11-27 MED ORDER — PRASUGREL HCL 10 MG PO TABS: 10.0000 mg | ORAL_TABLET | Freq: Every day | ORAL | Status: DC

## 2011-11-27 MED ORDER — SILDENAFIL CITRATE 100 MG PO TABS: 100.0000 mg | ORAL_TABLET | Freq: Every day | ORAL | Status: DC | PRN

## 2011-11-27 MED ORDER — TAMSULOSIN HCL 0.4 MG PO CAPS: 0.4000 mg | ORAL_CAPSULE | Freq: Every day | ORAL | Status: DC

## 2011-11-27 MED ORDER — NITROGLYCERIN 0.4 MG SL SUBL: 0.4000 mg | SUBLINGUAL_TABLET | SUBLINGUAL | Status: DC | PRN

## 2011-11-27 MED ORDER — ZOLPIDEM TARTRATE 10 MG PO TABS: 10.0000 mg | ORAL_TABLET | Freq: Every evening | ORAL | Status: DC | PRN

## 2011-11-27 MED ORDER — METOPROLOL SUCCINATE ER 25 MG PO TB24: 25.0000 mg | ORAL_TABLET | Freq: Every day | ORAL | Status: DC

## 2011-11-27 NOTE — Progress Notes (Signed)
  Subjective:    Patient ID: Tyrone Roman, male    DOB: [birth year], 66 y.o.   MRN: [id number]  HPI 66 yr old male for a cpx. He has been doing well with a few exceptions. He has chronic left knee pain, and this has been getting worse. He had arthroscopy on this 30 years ago. Also he has been coughing for a week. His chest is congested and he has a dry cough. No fever or chest pains. He thinks he is getting better.    Review of Systems  Constitutional: Negative.   HENT: Negative.   Eyes: Negative.   Respiratory: Positive for cough and chest tightness. Negative for apnea, choking, shortness of breath, wheezing and stridor.   Cardiovascular: Negative.   Gastrointestinal: Negative.   Genitourinary: Negative.   Musculoskeletal: Positive for arthralgias. Negative for myalgias, back pain, joint swelling and gait problem.  Skin: Negative.   Neurological: Negative.   Hematological: Negative.   Psychiatric/Behavioral: Negative.        Objective:   Physical Exam  Constitutional: He is oriented to person, place, and time. He appears well-developed and well-nourished. No distress.  HENT:  Head: Normocephalic and atraumatic.  Right Ear: External ear normal.  Left Ear: External ear normal.  Nose: Nose normal.  Mouth/Throat: Oropharynx is clear and moist. No oropharyngeal exudate.  Eyes: Conjunctivae normal and EOM are normal. Pupils are equal, round, and reactive to light. Right eye exhibits no discharge. Left eye exhibits no discharge. No scleral icterus.  Neck: Neck supple. No JVD present. No tracheal deviation present. No thyromegaly present.  Cardiovascular: Normal rate, regular rhythm, normal heart sounds and intact distal pulses.  Exam reveals no gallop and no friction rub.   No murmur heard. Pulmonary/Chest: Effort normal and breath sounds normal. No respiratory distress. He has no wheezes. He has no rales. He exhibits no tenderness.  Abdominal: Soft. Bowel sounds are normal. He  exhibits no distension and no mass. There is no tenderness. There is no rebound and no guarding.  Genitourinary: Rectum normal, prostate normal and penis normal. Guaiac negative stool. No penile tenderness.  Musculoskeletal: Normal range of motion. He exhibits no edema and no tenderness.  Lymphadenopathy:    He has no cervical adenopathy.  Neurological: He is alert and oriented to person, place, and time. He has normal reflexes. No cranial nerve deficit. He exhibits normal muscle tone. Coordination normal.  Skin: Skin is warm and dry. No rash noted. He is not diaphoretic. No erythema. No pallor.  Psychiatric: He has a normal mood and affect. His behavior is normal. Judgment and thought content normal.          Assessment & Plan:  Well exam. He has a viral URI that he seems to be getting over. Refer to Orthopedics for the knee

## 2011-11-28 NOTE — Addendum Note (Signed)
Addended by: Marrion Coy L on: 11/28/2011 03:36 PM   Modules accepted: Orders

## 2011-12-10 ENCOUNTER — Telehealth: Payer: Self-pay | Admitting: *Deleted

## 2011-12-10 NOTE — Telephone Encounter (Signed)
Explained to patient that he is currently on blood thinner-Effient and that Dr.Kaplan likes to see patient in office before any procedure to go over history and med. List. Cancelled procedure made office visit for Nov.14, at 1:30 pm. Patient was not very happy about this. Dionisio Paschal notified.

## 2011-12-20 ENCOUNTER — Ambulatory Visit (INDEPENDENT_AMBULATORY_CARE_PROVIDER_SITE_OTHER): Payer: 59 | Admitting: Gastroenterology

## 2011-12-20 ENCOUNTER — Encounter: Payer: Self-pay | Admitting: Gastroenterology

## 2011-12-20 ENCOUNTER — Other Ambulatory Visit: Payer: Self-pay | Admitting: Orthopaedic Surgery

## 2011-12-20 VITALS — BP 120/78 | HR 80 | Ht 72.5 in | Wt 229.0 lb

## 2011-12-20 DIAGNOSIS — M25512 Pain in left shoulder: Secondary | ICD-10-CM

## 2011-12-20 DIAGNOSIS — K2270 Barrett's esophagus without dysplasia: Secondary | ICD-10-CM

## 2011-12-20 DIAGNOSIS — D126 Benign neoplasm of colon, unspecified: Secondary | ICD-10-CM

## 2011-12-20 DIAGNOSIS — M25511 Pain in right shoulder: Secondary | ICD-10-CM

## 2011-12-20 DIAGNOSIS — M25562 Pain in left knee: Secondary | ICD-10-CM

## 2011-12-20 NOTE — Assessment & Plan Note (Signed)
Plan followup endoscopy with biopsy. Patient will continue effient

## 2011-12-20 NOTE — Patient Instructions (Addendum)
Your procedure has been scheduled Separate instructions have been given Stay on Effient per Dr Arlyce Dice

## 2011-12-20 NOTE — Assessment & Plan Note (Signed)
Plan follow-up colonoscopy 2016 

## 2011-12-20 NOTE — Progress Notes (Signed)
History of Present Illness:  Pleasant 66 year old white male with history of coronary artery disease, on Effient, Barrett's esophagus and colon polyps here for recall endoscopy. Barrett's was diagnosed in 2011. At that time he had a esophageal stricture dilated as well. An adenomatous polyp was removed in 2011. He currently has no GI complaints including abdominal pain, dysphagia, pyrosis, melena or hepatic easier.    Past Medical History  Diagnosis Date  . CAD (coronary artery disease)     sees Dr. Juanito Doom  . GERD (gastroesophageal reflux disease)   . Barrett's esophageal ulceration   . BPH (benign prostatic hyperplasia)   . Colonic polyp   . Hyperlipidemia   . SOB (shortness of breath)   . Dysphagia   . Pre-operative cardiovascular examination   . Contact with or exposure to venereal diseases   . Laceration of scalp   . Concussion with loss of consciousness of 30 minutes or less   . Nephrolithiasis   . Diverticulosis of colon   . Contact dermatitis   . COPD (chronic obstructive pulmonary disease)     sees Dr. Cyril Mourning    Past Surgical History  Procedure Date  . Tonsillectomy   . Left knee surgery   . Colonoscopy 03/01/09    per Dr. Arlyce Dice; had benign polyps, repeat 5 years   . Egd with dilatation 02/17/09    Barretts esophagus    family history includes Arthritis in his father and mother; Cancer in his maternal grandmother; Colon cancer in his father; Diabetes in his paternal uncle; Heart attack in his father; Prostate cancer in his father; and Skin cancer in his daughter. Current Outpatient Prescriptions  Medication Sig Dispense Refill  . aspirin 81 MG tablet Take 81 mg by mouth daily.        . fexofenadine (ALLEGRA) 180 MG tablet Take 180 mg by mouth daily.      . metoprolol succinate (TOPROL-XL) 25 MG 24 hr tablet Take 1 tablet (25 mg total) by mouth daily.  90 tablet  3  . nitroGLYCERIN (NITROSTAT) 0.4 MG SL tablet Place 1 tablet (0.4 mg total) under the tongue every 5  (five) minutes as needed.  50 tablet  3  . omeprazole (PRILOSEC) 40 MG capsule Take 1 capsule (40 mg total) by mouth daily.  90 capsule  3  . prasugrel (EFFIENT) 10 MG TABS Take 1 tablet (10 mg total) by mouth daily.  90 each  3  . sildenafil (VIAGRA) 100 MG tablet Take 1 tablet (100 mg total) by mouth daily as needed.  30 tablet  3  . simvastatin (ZOCOR) 40 MG tablet Take 1 tablet (40 mg total) by mouth at bedtime.  90 tablet  3  . Tamsulosin HCl (FLOMAX) 0.4 MG CAPS Take 1 capsule (0.4 mg total) by mouth daily.  90 capsule  3  . triamcinolone cream (KENALOG) 0.5 % Apply topically 3 (three) times daily.  30 g  11  . zolpidem (AMBIEN) 10 MG tablet Take 1 tablet (10 mg total) by mouth at bedtime as needed.  90 tablet  1   Allergies as of 12/20/2011 - Review Complete 12/20/2011  Allergen Reaction Noted  . Cephalexin  08/04/2010  . Clarithromycin  11/04/2006  . Lidoderm  08/04/2010    reports that he quit smoking about 23 years ago. His smoking use included Cigarettes. He has a 20 pack-year smoking history. He has never used smokeless tobacco. He reports that he drinks alcohol. He reports that he does not  use illicit drugs.     Review of Systems: Pertinent positive and negative review of systems were noted in the above HPI section. All other review of systems were otherwise negative.  Vital signs were reviewed in today's medical record Physical Exam: General: Well developed , well nourished, no acute distress Head: Normocephalic and atraumatic Eyes:  sclerae anicteric, EOMI Ears: Normal auditory acuity Mouth: No deformity or lesions Neck: Supple, no masses or thyromegaly Lungs: Clear throughout to auscultation Heart: Regular rate and rhythm; no murmurs, rubs or bruits Abdomen: Soft, non tender and non distended. No masses, hepatosplenomegaly or hernias noted. Normal Bowel sounds Rectal:deferred Musculoskeletal: Symmetrical with no gross deformities  Skin: No lesions on visible  extremities Pulses:  Normal pulses noted Extremities: No clubbing, cyanosis, edema or deformities noted Neurological: Alert oriented x 4, grossly nonfocal Cervical Nodes:  No significant cervical adenopathy Inguinal Nodes: No significant inguinal adenopathy Psychological:  Alert and cooperative. Normal mood and affect

## 2011-12-24 ENCOUNTER — Other Ambulatory Visit: Payer: 59

## 2011-12-25 ENCOUNTER — Encounter: Payer: 59 | Admitting: Gastroenterology

## 2011-12-27 ENCOUNTER — Telehealth: Payer: Self-pay | Admitting: Family Medicine

## 2011-12-27 DIAGNOSIS — M25569 Pain in unspecified knee: Secondary | ICD-10-CM

## 2011-12-27 NOTE — Telephone Encounter (Signed)
Pt called re: referral to orthapedic doctor. This doctor can not practice in [place] and has instructed pt to travel 2 hrs away for surgery in [place], [place]. Pt needs a referral to an Orthopedic in [place] that can perform the surgery. Pt has to have this surgery before end of year.

## 2011-12-31 ENCOUNTER — Telehealth: Payer: Self-pay | Admitting: Cardiology

## 2011-12-31 ENCOUNTER — Ambulatory Visit (AMBULATORY_SURGERY_CENTER): Payer: 59 | Admitting: Gastroenterology

## 2011-12-31 ENCOUNTER — Encounter: Payer: Self-pay | Admitting: Gastroenterology

## 2011-12-31 VITALS — BP 118/76 | HR 71 | Temp 98.4°F | Resp 15 | Ht 72.5 in | Wt 229.0 lb

## 2011-12-31 DIAGNOSIS — K209 Esophagitis, unspecified without bleeding: Secondary | ICD-10-CM

## 2011-12-31 DIAGNOSIS — K2270 Barrett's esophagus without dysplasia: Secondary | ICD-10-CM

## 2011-12-31 MED ORDER — SODIUM CHLORIDE 0.9 % IV SOLN: 500.0000 mL | INTRAVENOUS | Status: DC

## 2011-12-31 NOTE — Op Note (Signed)
[facility] [address] [place], [address]   ENDOSCOPY PROCEDURE REPORT  PATIENT: Tyrone, Roman  MR#: [id number] BIRTHDATE: 11/28/1945 , 66  yrs. old GENDER: Male ENDOSCOPIST: Louis Meckel, MD REFERRED BY: PROCEDURE DATE:  12/31/2011 PROCEDURE:  EGD w/ biopsy ASA CLASS:     Class II INDICATIONS:  follow up of Barrett's esophagus. MEDICATIONS: MAC sedation, administered by CRNA and propofol (Diprivan) 250mg  IV TOPICAL ANESTHETIC:  DESCRIPTION OF PROCEDURE: After the risks benefits and alternatives of the procedure were thoroughly explained, informed consent was obtained.  The LB GIF-H180 [id number] endoscope was introduced through the mouth and advanced to the third portion of the duodenum. Without limitations.  The instrument was slowly withdrawn as the mucosa was fully examined.      There was slight irregularity at the GE junction corresponding to short segment Barrett's esophagus.  Multiple biopsies were taken. There was mild erythema in the gastric body and fundus. The remainder of the upper endoscopy exam was otherwise normal. Retroflexed views revealed no abnormalities.     The scope was then withdrawn from the patient and the procedure completed.  COMPLICATIONS: There were no complications. ENDOSCOPIC IMPRESSION: 1.   There was slight irregularity at the GE junction corresponding to short segment Barrett's esophagus.  Multiple biopsies were taken  2.   mild, nonerosive gastritis 3.  The remainder of the upper endoscopy exam was otherwise normal  RECOMMENDATIONS: Await biopsy results  REPEAT EXAM:  eSigned:  Louis Meckel, MD 12/31/2011 3:02 PM   [id number] Marguerita Beards, MD and Bishop Limbo MD

## 2011-12-31 NOTE — Patient Instructions (Addendum)
YOU HAD AN ENDOSCOPIC PROCEDURE TODAY AT THE [facility]: Refer to the procedure report that was given to you for any specific questions about what was found during the examination.  If the procedure report does not answer your questions, please call your gastroenterologist to clarify.  If you requested that your care partner not be given the details of your procedure findings, then the procedure report has been included in a sealed envelope for you to review at your convenience later.  YOU SHOULD EXPECT: Some feelings of bloating in the abdomen. Passage of more gas than usual.  Walking can help get rid of the air that was put into your GI tract during the procedure and reduce the bloating. If you had a lower endoscopy (such as a colonoscopy or flexible sigmoidoscopy) you may notice spotting of blood in your stool or on the toilet paper. If you underwent a bowel prep for your procedure, then you may not have a normal bowel movement for a few days.  DIET: Your first meal following the procedure should be a light meal and then it is ok to progress to your normal diet.  A half-sandwich or bowl of soup is an example of a good first meal.  Heavy or fried foods are harder to digest and may make you feel nauseous or bloated.  Likewise meals heavy in dairy and vegetables can cause extra gas to form and this can also increase the bloating.  Drink plenty of fluids but you should avoid alcoholic beverages for 24 hours.  ACTIVITY: Your care partner should take you home directly after the procedure.  You should plan to take it easy, moving slowly for the rest of the day.  You can resume normal activity the day after the procedure however you should NOT DRIVE or use heavy machinery for 24 hours (because of the sedation medicines used during the test).    SYMPTOMS TO REPORT IMMEDIATELY: A gastroenterologist can be reached at any hour.  During normal business hours, 8:30 AM to 5:00 PM Monday through Friday,  call [phone].  After hours and on weekends, please call the GI answering service at [phone] who will take a message and have the physician on call contact you.   Following upper endoscopy (EGD)  Vomiting of blood or coffee ground material  New chest pain or pain under the shoulder blades  Painful or persistently difficult swallowing  New shortness of breath  Fever of 100F or higher  Black, tarry-looking stools  FOLLOW UP: If any biopsies were taken you will be contacted by phone or by letter within the next 1-3 weeks.  Call your gastroenterologist if you have not heard about the biopsies in 3 weeks.  Our staff will call the home number listed on your records the next business day following your procedure to check on you and address any questions or concerns that you may have at that time regarding the information given to you following your procedure. This is a courtesy call and so if there is no answer at the home number and we have not heard from you through the emergency physician on call, we will assume that you have returned to your regular daily activities without incident.  SIGNATURES/CONFIDENTIALITY: You and/or your care partner have signed paperwork which will be entered into your electronic medical record.  These signatures attest to the fact that that the information above on your After Visit Summary has been reviewed and is understood.  Full responsibility   of the confidentiality of this discharge information lies with you and/or your care-partner.  Thank-you for choosing us for your medical needs. 

## 2011-12-31 NOTE — Progress Notes (Signed)
Patient did not have preoperative order for IV antibiotic SSI prophylaxis. (G8918)  Patient did not experience any of the following events: a burn prior to discharge; a fall within the facility; wrong site/side/patient/procedure/implant event; or a hospital transfer or hospital admission upon discharge from the facility. (G8907)  

## 2011-12-31 NOTE — Telephone Encounter (Signed)
Dr Madilyn Fireman, surg clearance for knee surgery, will send fax to complete, pls fax back asap, surg 01-18-12

## 2011-12-31 NOTE — Progress Notes (Signed)
Last dose of Effient- today.  Pt was instructed not to stop

## 2012-01-01 ENCOUNTER — Telehealth: Payer: Self-pay | Admitting: Family Medicine

## 2012-01-01 ENCOUNTER — Telehealth: Payer: Self-pay | Admitting: *Deleted

## 2012-01-01 NOTE — Telephone Encounter (Signed)
  Follow up Call-  Call back number 12/31/2011  Post procedure Call Back phone  # [phone]  Permission to leave phone message No     Patient questions:  Do you have a fever, pain , or abdominal swelling? no Pain Score  0 *  Have you tolerated food without any problems? yes  Have you been able to return to your normal activities? yes  Do you have any questions about your discharge instructions: Diet   no Medications  no Follow up visit  no  Do you have questions or concerns about your Care? no  Actions: * If pain score is 4 or above: No action needed, pain <4.

## 2012-01-01 NOTE — Telephone Encounter (Signed)
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

## 2012-01-01 NOTE — Telephone Encounter (Signed)
The Orthopedic surgeon we referred him to does not except his insurance- [employer].  He said they except [facility], but not [employer]. He would like new referral in network. Thank you.  His phone number- [phone].

## 2012-01-07 ENCOUNTER — Encounter: Payer: 59 | Admitting: Gastroenterology

## 2012-01-07 ENCOUNTER — Encounter: Payer: Self-pay | Admitting: Gastroenterology

## 2012-01-16 DIAGNOSIS — M25569 Pain in unspecified knee: Secondary | ICD-10-CM | POA: Diagnosis not present

## 2012-01-16 DIAGNOSIS — E785 Hyperlipidemia, unspecified: Secondary | ICD-10-CM | POA: Diagnosis not present

## 2012-01-16 DIAGNOSIS — I10 Essential (primary) hypertension: Secondary | ICD-10-CM | POA: Diagnosis not present

## 2012-01-16 DIAGNOSIS — M6750 Plica syndrome, unspecified knee: Secondary | ICD-10-CM | POA: Diagnosis not present

## 2012-01-16 DIAGNOSIS — I2510 Atherosclerotic heart disease of native coronary artery without angina pectoris: Secondary | ICD-10-CM | POA: Diagnosis not present

## 2012-01-16 DIAGNOSIS — M2240 Chondromalacia patellae, unspecified knee: Secondary | ICD-10-CM | POA: Diagnosis not present

## 2012-01-16 DIAGNOSIS — K219 Gastro-esophageal reflux disease without esophagitis: Secondary | ICD-10-CM | POA: Diagnosis not present

## 2012-01-16 DIAGNOSIS — M23305 Other meniscus derangements, unspecified medial meniscus, unspecified knee: Secondary | ICD-10-CM | POA: Diagnosis not present

## 2012-01-16 DIAGNOSIS — M23329 Other meniscus derangements, posterior horn of medial meniscus, unspecified knee: Secondary | ICD-10-CM | POA: Diagnosis not present

## 2012-01-16 DIAGNOSIS — M9420 Chondromalacia, unspecified site: Secondary | ICD-10-CM | POA: Diagnosis not present

## 2012-01-16 DIAGNOSIS — M23302 Other meniscus derangements, unspecified lateral meniscus, unspecified knee: Secondary | ICD-10-CM | POA: Diagnosis not present

## 2012-01-16 DIAGNOSIS — Z7982 Long term (current) use of aspirin: Secondary | ICD-10-CM | POA: Diagnosis not present

## 2012-01-16 DIAGNOSIS — M23359 Other meniscus derangements, posterior horn of lateral meniscus, unspecified knee: Secondary | ICD-10-CM | POA: Diagnosis not present

## 2012-01-16 DIAGNOSIS — I252 Old myocardial infarction: Secondary | ICD-10-CM | POA: Diagnosis not present

## 2012-01-23 DIAGNOSIS — M25569 Pain in unspecified knee: Secondary | ICD-10-CM | POA: Diagnosis not present

## 2012-01-31 DIAGNOSIS — M25569 Pain in unspecified knee: Secondary | ICD-10-CM | POA: Diagnosis not present

## 2012-02-04 DIAGNOSIS — M25569 Pain in unspecified knee: Secondary | ICD-10-CM | POA: Diagnosis not present

## 2012-02-05 ENCOUNTER — Ambulatory Visit (INDEPENDENT_AMBULATORY_CARE_PROVIDER_SITE_OTHER): Payer: 59 | Admitting: *Deleted

## 2012-02-05 DIAGNOSIS — Z23 Encounter for immunization: Secondary | ICD-10-CM

## 2012-02-08 DIAGNOSIS — M25569 Pain in unspecified knee: Secondary | ICD-10-CM | POA: Diagnosis not present

## 2012-02-11 DIAGNOSIS — M25569 Pain in unspecified knee: Secondary | ICD-10-CM | POA: Diagnosis not present

## 2012-02-15 ENCOUNTER — Encounter: Payer: Self-pay | Admitting: Family Medicine

## 2012-02-15 ENCOUNTER — Ambulatory Visit (INDEPENDENT_AMBULATORY_CARE_PROVIDER_SITE_OTHER): Payer: Medicare Other | Admitting: Family Medicine

## 2012-02-15 VITALS — BP 110/70 | HR 116 | Temp 97.9°F | Wt 220.0 lb

## 2012-02-15 DIAGNOSIS — M2550 Pain in unspecified joint: Secondary | ICD-10-CM

## 2012-02-15 LAB — CBC WITH DIFFERENTIAL/PLATELET
Basophils Absolute: 0 10*3/uL (ref 0.0–0.1)
Basophils Relative: 0.2 % (ref 0.0–3.0)
Eosinophils Absolute: 0.5 10*3/uL (ref 0.0–0.7)
Eosinophils Relative: 4.1 % (ref 0.0–5.0)
HCT: 41.1 % (ref 39.0–52.0)
Hemoglobin: 13.9 g/dL (ref 13.0–17.0)
Lymphocytes Relative: 15.3 % (ref 12.0–46.0)
Lymphs Abs: 1.8 10*3/uL (ref 0.7–4.0)
MCHC: 33.8 g/dL (ref 30.0–36.0)
MCV: 85.5 fl (ref 78.0–100.0)
Monocytes Absolute: 0.9 10*3/uL (ref 0.1–1.0)
Monocytes Relative: 8 % (ref 3.0–12.0)
Neutro Abs: 8.5 10*3/uL — ABNORMAL HIGH (ref 1.4–7.7)
Neutrophils Relative %: 72.4 % (ref 43.0–77.0)
Platelets: 290 10*3/uL (ref 150.0–400.0)
RBC: 4.81 Mil/uL (ref 4.22–5.81)
RDW: 13.8 % (ref 11.5–14.6)
WBC: 11.7 10*3/uL — ABNORMAL HIGH (ref 4.5–10.5)

## 2012-02-15 LAB — SEDIMENTATION RATE: Sed Rate: 69 mm/h — ABNORMAL HIGH (ref 0–22)

## 2012-02-15 MED ORDER — METHYLPREDNISOLONE ACETATE 80 MG/ML IJ SUSP
120.0000 mg | Freq: Once | INTRAMUSCULAR | Status: AC
  Administered 2012-02-15: 120 mg via INTRAMUSCULAR

## 2012-02-15 MED ORDER — PREDNISONE 10 MG PO TABS: ORAL_TABLET | ORAL | Status: DC

## 2012-02-15 NOTE — Addendum Note (Signed)
Addended by: Aniceto Boss A on: 02/15/2012 03:13 PM   Modules accepted: Orders

## 2012-02-15 NOTE — Progress Notes (Signed)
  Subjective:    Patient ID: Tyrone Roman, male    DOB: [birth year], 67 y.o.   MRN: [id number]  HPI Here for the sudden onset of stiffness and aching pains in the shoulders, the arms, and the hands. No swelling or warmth of the joints. In fact the joints don't seem to be much involved. No fevers or rashes. He is recovering from recent arthroscopic surgery on the left knee. He is taking etodolac and occasional Vicodin for this.    Review of Systems  Constitutional: Negative.   Respiratory: Negative.   Cardiovascular: Negative.   Musculoskeletal: Positive for myalgias. Negative for back pain, joint swelling, arthralgias and gait problem.  Skin: Negative.        Objective:   Physical Exam  Constitutional: He appears well-developed and well-nourished.  Musculoskeletal:       He has pain from simply shaking my hand, but his hands are not tender. The DIPs, PIPs, MCPs, and wrists are benign. Full ROM of the shoulders but he has pain on moving them          Assessment & Plan:  Possible PMR. Given a shot of Depomedrol and we will start on a Prednisone taper tomorrow am by taking 40 mg daily. Get labs. Recheck next week.

## 2012-02-16 LAB — RHEUMATOID FACTOR: Rhuematoid fact SerPl-aCnc: 21 IU/mL — ABNORMAL HIGH (ref <=14)

## 2012-02-18 DIAGNOSIS — M25569 Pain in unspecified knee: Secondary | ICD-10-CM | POA: Diagnosis not present

## 2012-02-18 LAB — ANTI-NUCLEAR AB-TITER (ANA TITER): ANA Titer 1: 1:40 {titer} — ABNORMAL HIGH

## 2012-02-18 LAB — ANA: Anti Nuclear Antibody(ANA): POSITIVE — AB

## 2012-02-19 NOTE — Progress Notes (Signed)
Quick Note:  I spoke with pt and he is going to schedule a office visit for next week. ______

## 2012-02-20 ENCOUNTER — Ambulatory Visit (INDEPENDENT_AMBULATORY_CARE_PROVIDER_SITE_OTHER): Payer: Medicare Other | Admitting: Family Medicine

## 2012-02-20 ENCOUNTER — Encounter (HOSPITAL_COMMUNITY): Payer: Self-pay | Admitting: Emergency Medicine

## 2012-02-20 ENCOUNTER — Encounter: Payer: Self-pay | Admitting: Family Medicine

## 2012-02-20 ENCOUNTER — Inpatient Hospital Stay (HOSPITAL_COMMUNITY)
Admission: EM | Admit: 2012-02-20 | Discharge: 2012-02-22 | DRG: 378 | Disposition: A | Discharge status: Home / Self Care | Payer: Medicare Other | Attending: Internal Medicine | Admitting: Internal Medicine

## 2012-02-20 VITALS — BP 110/74 | HR 115 | Temp 97.9°F | Wt 220.0 lb

## 2012-02-20 DIAGNOSIS — L259 Unspecified contact dermatitis, unspecified cause: Secondary | ICD-10-CM | POA: Diagnosis present

## 2012-02-20 DIAGNOSIS — I10 Essential (primary) hypertension: Secondary | ICD-10-CM | POA: Diagnosis not present

## 2012-02-20 DIAGNOSIS — Z881 Allergy status to other antibiotic agents status: Secondary | ICD-10-CM

## 2012-02-20 DIAGNOSIS — J4489 Other specified chronic obstructive pulmonary disease: Secondary | ICD-10-CM | POA: Diagnosis present

## 2012-02-20 DIAGNOSIS — K922 Gastrointestinal hemorrhage, unspecified: Secondary | ICD-10-CM

## 2012-02-20 DIAGNOSIS — IMO0002 Reserved for concepts with insufficient information to code with codable children: Secondary | ICD-10-CM

## 2012-02-20 DIAGNOSIS — Z9089 Acquired absence of other organs: Secondary | ICD-10-CM

## 2012-02-20 DIAGNOSIS — K5731 Diverticulosis of large intestine without perforation or abscess with bleeding: Principal | ICD-10-CM | POA: Diagnosis present

## 2012-02-20 DIAGNOSIS — Z87891 Personal history of nicotine dependence: Secondary | ICD-10-CM

## 2012-02-20 DIAGNOSIS — M353 Polymyalgia rheumatica: Secondary | ICD-10-CM | POA: Diagnosis present

## 2012-02-20 DIAGNOSIS — I252 Old myocardial infarction: Secondary | ICD-10-CM | POA: Diagnosis not present

## 2012-02-20 DIAGNOSIS — K5730 Diverticulosis of large intestine without perforation or abscess without bleeding: Secondary | ICD-10-CM | POA: Diagnosis not present

## 2012-02-20 DIAGNOSIS — Z87442 Personal history of urinary calculi: Secondary | ICD-10-CM

## 2012-02-20 DIAGNOSIS — D126 Benign neoplasm of colon, unspecified: Secondary | ICD-10-CM | POA: Diagnosis present

## 2012-02-20 DIAGNOSIS — Z888 Allergy status to other drugs, medicaments and biological substances status: Secondary | ICD-10-CM | POA: Diagnosis not present

## 2012-02-20 DIAGNOSIS — N400 Enlarged prostate without lower urinary tract symptoms: Secondary | ICD-10-CM | POA: Diagnosis present

## 2012-02-20 DIAGNOSIS — Z8249 Family history of ischemic heart disease and other diseases of the circulatory system: Secondary | ICD-10-CM

## 2012-02-20 DIAGNOSIS — Z7982 Long term (current) use of aspirin: Secondary | ICD-10-CM | POA: Diagnosis not present

## 2012-02-20 DIAGNOSIS — T148XXA Other injury of unspecified body region, initial encounter: Secondary | ICD-10-CM

## 2012-02-20 DIAGNOSIS — E785 Hyperlipidemia, unspecified: Secondary | ICD-10-CM | POA: Diagnosis not present

## 2012-02-20 DIAGNOSIS — J449 Chronic obstructive pulmonary disease, unspecified: Secondary | ICD-10-CM | POA: Diagnosis present

## 2012-02-20 DIAGNOSIS — R55 Syncope and collapse: Secondary | ICD-10-CM | POA: Diagnosis not present

## 2012-02-20 DIAGNOSIS — S060X1A Concussion with loss of consciousness of 30 minutes or less, initial encounter: Secondary | ICD-10-CM

## 2012-02-20 DIAGNOSIS — I2510 Atherosclerotic heart disease of native coronary artery without angina pectoris: Secondary | ICD-10-CM | POA: Diagnosis not present

## 2012-02-20 DIAGNOSIS — Z7902 Long term (current) use of antithrombotics/antiplatelets: Secondary | ICD-10-CM | POA: Diagnosis not present

## 2012-02-20 DIAGNOSIS — S335XXA Sprain of ligaments of lumbar spine, initial encounter: Secondary | ICD-10-CM

## 2012-02-20 DIAGNOSIS — M129 Arthropathy, unspecified: Secondary | ICD-10-CM | POA: Diagnosis present

## 2012-02-20 DIAGNOSIS — K219 Gastro-esophageal reflux disease without esophagitis: Secondary | ICD-10-CM | POA: Diagnosis present

## 2012-02-20 DIAGNOSIS — Z860100 Personal history of colon polyps, unspecified: Secondary | ICD-10-CM

## 2012-02-20 DIAGNOSIS — Z9861 Coronary angioplasty status: Secondary | ICD-10-CM | POA: Diagnosis not present

## 2012-02-20 DIAGNOSIS — S0100XA Unspecified open wound of scalp, initial encounter: Secondary | ICD-10-CM

## 2012-02-20 DIAGNOSIS — D62 Acute posthemorrhagic anemia: Secondary | ICD-10-CM | POA: Diagnosis present

## 2012-02-20 DIAGNOSIS — R51 Headache: Secondary | ICD-10-CM

## 2012-02-20 DIAGNOSIS — K2270 Barrett's esophagus without dysplasia: Secondary | ICD-10-CM | POA: Diagnosis not present

## 2012-02-20 DIAGNOSIS — Z79899 Other long term (current) drug therapy: Secondary | ICD-10-CM | POA: Diagnosis not present

## 2012-02-20 DIAGNOSIS — R42 Dizziness and giddiness: Secondary | ICD-10-CM

## 2012-02-20 DIAGNOSIS — R0600 Dyspnea, unspecified: Secondary | ICD-10-CM

## 2012-02-20 DIAGNOSIS — N200 Calculus of kidney: Secondary | ICD-10-CM

## 2012-02-20 DIAGNOSIS — R0602 Shortness of breath: Secondary | ICD-10-CM

## 2012-02-20 DIAGNOSIS — H9319 Tinnitus, unspecified ear: Secondary | ICD-10-CM

## 2012-02-20 DIAGNOSIS — S139XXA Sprain of joints and ligaments of unspecified parts of neck, initial encounter: Secondary | ICD-10-CM

## 2012-02-20 LAB — CBC WITH DIFFERENTIAL/PLATELET
Basophils Absolute: 0.1 10*3/uL (ref 0.0–0.1)
Basophils Relative: 1 % (ref 0–1)
Eosinophils Absolute: 0.2 10*3/uL (ref 0.0–0.7)
Eosinophils Relative: 1 % (ref 0–5)
HCT: 39.3 % (ref 39.0–52.0)
Hemoglobin: 13.4 g/dL (ref 13.0–17.0)
Lymphocytes Relative: 36 % (ref 12–46)
Lymphs Abs: 4.3 10*3/uL — ABNORMAL HIGH (ref 0.7–4.0)
MCH: 29.3 pg (ref 26.0–34.0)
MCHC: 34.1 g/dL (ref 30.0–36.0)
MCV: 85.8 fL (ref 78.0–100.0)
Monocytes Absolute: 1.1 10*3/uL — ABNORMAL HIGH (ref 0.1–1.0)
Monocytes Relative: 9 % (ref 3–12)
Neutro Abs: 6.2 10*3/uL (ref 1.7–7.7)
Neutrophils Relative %: 53 % (ref 43–77)
Platelets: 352 10*3/uL (ref 150–400)
RBC: 4.58 MIL/uL (ref 4.22–5.81)
RDW: 13.5 % (ref 11.5–15.5)
WBC: 11.8 10*3/uL — ABNORMAL HIGH (ref 4.0–10.5)

## 2012-02-20 LAB — CBC
HCT: 32.7 % — ABNORMAL LOW (ref 39.0–52.0)
HCT: 31.6 % — ABNORMAL LOW (ref 39.0–52.0)
Hemoglobin: 11 g/dL — ABNORMAL LOW (ref 13.0–17.0)
Hemoglobin: 10.6 g/dL — ABNORMAL LOW (ref 13.0–17.0)
MCH: 29.1 pg (ref 26.0–34.0)
MCH: 29 pg (ref 26.0–34.0)
MCHC: 33.6 g/dL (ref 30.0–36.0)
MCHC: 33.5 g/dL (ref 30.0–36.0)
MCV: 86.5 fL (ref 78.0–100.0)
MCV: 86.6 fL (ref 78.0–100.0)
Platelets: 251 K/uL (ref 150–400)
Platelets: 246 10*3/uL (ref 150–400)
RBC: 3.78 MIL/uL — ABNORMAL LOW (ref 4.22–5.81)
RBC: 3.65 MIL/uL — ABNORMAL LOW (ref 4.22–5.81)
RDW: 13.3 % (ref 11.5–15.5)
RDW: 13.3 % (ref 11.5–15.5)
WBC: 9.9 K/uL (ref 4.0–10.5)
WBC: 13.7 10*3/uL — ABNORMAL HIGH (ref 4.0–10.5)

## 2012-02-20 LAB — TYPE AND SCREEN
ABO/RH(D): A POS
Antibody Screen: NEGATIVE

## 2012-02-20 LAB — COMPREHENSIVE METABOLIC PANEL
ALT: 13 U/L (ref 0–53)
AST: 16 U/L (ref 0–37)
Albumin: 3.7 g/dL (ref 3.5–5.2)
Alkaline Phosphatase: 49 U/L (ref 39–117)
BUN: 29 mg/dL — ABNORMAL HIGH (ref 6–23)
CO2: 24 mEq/L (ref 19–32)
Calcium: 10.1 mg/dL (ref 8.4–10.5)
Chloride: 105 mEq/L (ref 96–112)
Creatinine, Ser: 0.97 mg/dL (ref 0.50–1.35)
GFR calc Af Amer: >90 mL/min (ref >90)
GFR calc non Af Amer: 84 mL/min — ABNORMAL LOW (ref >90)
Glucose, Bld: 123 mg/dL — ABNORMAL HIGH (ref 70–99)
Potassium: 3.7 mEq/L (ref 3.5–5.1)
Sodium: 140 mEq/L (ref 135–145)
Total Bilirubin: 0.5 mg/dL (ref 0.3–1.2)
Total Protein: 7.4 g/dL (ref 6.0–8.3)

## 2012-02-20 LAB — ABO/RH: ABO/RH(D): A POS

## 2012-02-20 LAB — PROTIME-INR
INR: 1.02 (ref 0.00–1.49)
Prothrombin Time: 13.3 s (ref 11.6–15.2)

## 2012-02-20 LAB — TROPONIN I: Troponin I: <0.3 ng/mL

## 2012-02-20 MED ORDER — ONDANSETRON HCL 4 MG/2ML IJ SOLN: 4.0000 mg | Freq: Three times a day (TID) | INTRAMUSCULAR | Status: DC | PRN

## 2012-02-20 MED ORDER — NITROGLYCERIN 0.4 MG SL SUBL: 0.4000 mg | SUBLINGUAL_TABLET | SUBLINGUAL | Status: DC | PRN

## 2012-02-20 MED ORDER — SODIUM CHLORIDE 0.9 % IJ SOLN
3.0000 mL | Freq: Two times a day (BID) | INTRAMUSCULAR | Status: DC
  Administered 2012-02-20 – 2012-02-21 (×2): 3 mL via INTRAVENOUS

## 2012-02-20 MED ORDER — ADULT MULTIVITAMIN W/MINERALS CH
1.0000 | ORAL_TABLET | Freq: Every day | ORAL | Status: DC
  Administered 2012-02-20 – 2012-02-21 (×2): 1 via ORAL
  Filled 2012-02-20 (×3): qty 1

## 2012-02-20 MED ORDER — ACETAMINOPHEN 650 MG RE SUPP: 650.0000 mg | Freq: Four times a day (QID) | RECTAL | Status: DC | PRN

## 2012-02-20 MED ORDER — SIMVASTATIN 40 MG PO TABS
40.0000 mg | ORAL_TABLET | Freq: Every day | ORAL | Status: DC
  Administered 2012-02-20 – 2012-02-21 (×2): 40 mg via ORAL
  Filled 2012-02-20 (×3): qty 1

## 2012-02-20 MED ORDER — HYDROCODONE-ACETAMINOPHEN 5-325 MG PO TABS
1.0000 | ORAL_TABLET | Freq: Four times a day (QID) | ORAL | Status: DC | PRN
  Administered 2012-02-20 – 2012-02-22 (×3): 1 via ORAL
  Filled 2012-02-20 (×3): qty 1

## 2012-02-20 MED ORDER — SODIUM CHLORIDE 0.9 % IV SOLN: INTRAVENOUS | Status: AC

## 2012-02-20 MED ORDER — SODIUM CHLORIDE 0.9 % IV SOLN
1000.0000 mL | INTRAVENOUS | Status: DC
  Administered 2012-02-20: 1000 mL via INTRAVENOUS

## 2012-02-20 MED ORDER — ZOLPIDEM TARTRATE 5 MG PO TABS: 10.0000 mg | ORAL_TABLET | Freq: Every evening | ORAL | Status: DC | PRN

## 2012-02-20 MED ORDER — PANTOPRAZOLE SODIUM 40 MG IV SOLR
40.0000 mg | Freq: Once | INTRAVENOUS | Status: AC
  Administered 2012-02-20: 40 mg via INTRAVENOUS
  Filled 2012-02-20: qty 40

## 2012-02-20 MED ORDER — MORPHINE SULFATE 2 MG/ML IJ SOLN
1.0000 mg | INTRAMUSCULAR | Status: DC | PRN
  Administered 2012-02-21: 1 mg via INTRAVENOUS
  Filled 2012-02-20: qty 1

## 2012-02-20 MED ORDER — ONDANSETRON HCL 4 MG/2ML IJ SOLN
4.0000 mg | Freq: Once | INTRAMUSCULAR | Status: AC
  Administered 2012-02-20: 4 mg via INTRAVENOUS
  Filled 2012-02-20: qty 2

## 2012-02-20 MED ORDER — TAMSULOSIN HCL 0.4 MG PO CAPS
0.4000 mg | ORAL_CAPSULE | Freq: Every day | ORAL | Status: DC
  Administered 2012-02-20 – 2012-02-21 (×2): 0.4 mg via ORAL
  Filled 2012-02-20 (×3): qty 1

## 2012-02-20 MED ORDER — MORPHINE SULFATE 4 MG/ML IJ SOLN
4.0000 mg | Freq: Once | INTRAMUSCULAR | Status: AC
  Administered 2012-02-20: 4 mg via INTRAVENOUS
  Filled 2012-02-20: qty 1

## 2012-02-20 MED ORDER — SODIUM CHLORIDE 0.9 % IV SOLN: INTRAVENOUS | Status: DC

## 2012-02-20 MED ORDER — ONDANSETRON HCL 4 MG PO TABS: 4.0000 mg | ORAL_TABLET | Freq: Four times a day (QID) | ORAL | Status: DC | PRN

## 2012-02-20 MED ORDER — LORATADINE 10 MG PO TABS
10.0000 mg | ORAL_TABLET | Freq: Every day | ORAL | Status: DC
  Administered 2012-02-20 – 2012-02-21 (×2): 10 mg via ORAL
  Filled 2012-02-20 (×3): qty 1

## 2012-02-20 MED ORDER — ONDANSETRON HCL 4 MG/2ML IJ SOLN: 4.0000 mg | Freq: Once | INTRAMUSCULAR | Status: DC

## 2012-02-20 MED ORDER — ONDANSETRON HCL 4 MG/2ML IJ SOLN: 4.0000 mg | Freq: Four times a day (QID) | INTRAMUSCULAR | Status: DC | PRN

## 2012-02-20 MED ORDER — ZOLPIDEM TARTRATE 5 MG PO TABS
5.0000 mg | ORAL_TABLET | Freq: Every evening | ORAL | Status: DC | PRN
Start: 2012-02-20 — End: 2012-02-22
  Administered 2012-02-20 – 2012-02-22 (×2): 5 mg via ORAL
  Filled 2012-02-20 (×2): qty 1

## 2012-02-20 MED ORDER — SODIUM CHLORIDE 0.9 % IV SOLN
1000.0000 mL | Freq: Once | INTRAVENOUS | Status: AC
  Administered 2012-02-20: 1000 mL via INTRAVENOUS

## 2012-02-20 MED ORDER — PANTOPRAZOLE SODIUM 40 MG IV SOLR
40.0000 mg | Freq: Two times a day (BID) | INTRAVENOUS | Status: DC
  Administered 2012-02-20 – 2012-02-21 (×3): 40 mg via INTRAVENOUS
  Filled 2012-02-20 (×5): qty 40

## 2012-02-20 MED ORDER — ACETAMINOPHEN 325 MG PO TABS: 650.0000 mg | ORAL_TABLET | Freq: Four times a day (QID) | ORAL | Status: DC | PRN

## 2012-02-20 NOTE — H&P (Signed)
[facility] History and Physical  CASEY FYE ZOX:[id number] DOB: 1945/12/15 DOA: 02/20/2012  Referring physician: Dr. Manus Gunning PCP: Nelwyn Salisbury, MD  Specialists: Dr. Arlyce Dice ([facility])  Chief Complaint: Bright red blood per rectum  HPI: Tyrone Roman is a 67 y.o. male 67 y.o. male here for the sudden onset this am of passing moderate amounts of bright red blood per rectum. He is bleeding both with and without BMs. Says that he woke up in a puddle of blood and has passed moderate amounts of blood 9 times since this morning. Was seen by his PCP who sent him to the ED. He admits to lower abdominal cramping and pressure but no pain. Denies nausea, vomiting, fevers, decreased appetite or weight loss. Is on a baby asa, effient, and prednisone 40 mg daily at home; has not taken any medication today. Hgb was 13.4 grams initially this AM. He had a colonoscopy in 02/2009 per Dr. Arlyce Dice showing moderate diverticulosis and two benign polyps (TA's). He had an EGD last month showing only benign inflammation on esophageal biopsies (has history of Barretts). Is on omeprazole 40 mg daily at home as well. Never had similar episodes to this in the past. In ED patient hemodynamically stable, Hgb 13.4; repeated Hgb 11.0. Bleeding already slowing down. GI has been consulted and [facility] called to admit patient for further evaluation and treatment.   Review of Systems:  Negative except as mentioned on HPI.  Past Medical History  Diagnosis Date  . CAD (coronary artery disease)     sees Dr. Juanito Doom  . GERD (gastroesophageal reflux disease)   . Barrett's esophageal ulceration   . BPH (benign prostatic hyperplasia)   . Colonic polyp   . Hyperlipidemia   . SOB (shortness of breath)   . Dysphagia   . Pre-operative cardiovascular examination   . Contact with or exposure to venereal diseases   . Laceration of scalp   . Concussion with loss of consciousness of 30 minutes or less   . Nephrolithiasis   .  Diverticulosis of colon   . Contact dermatitis   . COPD (chronic obstructive pulmonary disease)     sees Dr. Cyril Mourning   . Arthritis   . Hypertension   . Myocardial infarction     2011- stent placed   Past Surgical History  Procedure Date  . Tonsillectomy   . Left knee surgery   . Colonoscopy 03/01/09    per Dr. Arlyce Dice; had benign polyps, repeat 5 years   . Egd with dilatation 02/17/09    Barretts esophagus   . Heart stent   . Right knee surgery    Social History:  reports that he quit smoking about 23 years ago. His smoking use included Cigarettes. He has a 20 pack-year smoking history. He has never used smokeless tobacco. He reports that he does not drink alcohol or use illicit drugs. From home, with not need on assistance with ADL's   Allergies  Allergen Reactions  . Cephalexin Shortness Of Breath and Rash  . Clarithromycin Shortness Of Breath and Rash  . Lidoderm     "made me act weird"    Family History  Problem Relation Age of Onset  . Heart attack Father     cardiovascular disorder  . Arthritis Father     family hx  . Colon cancer Father     mets  . Prostate cancer Father     1st degree relative  . Arthritis Mother   .  Diabetes Paternal Uncle   . Cancer Maternal Grandmother   . Skin cancer Daughter   . Esophageal cancer Neg Hx   . Stomach cancer Neg Hx   . Rectal cancer Neg Hx     Prior to Admission medications   Medication Sig Start Date End Date Taking? Authorizing Provider  aspirin EC 81 MG tablet Take 81 mg by mouth daily.   Yes Historical Provider, MD  cetirizine (ZYRTEC) 10 MG tablet Take 10 mg by mouth daily.   Yes Historical Provider, MD  HYDROcodone-acetaminophen (NORCO/VICODIN) 5-325 MG per tablet Take 1-2 tablets by mouth every 4 (four) hours as needed. For pain   Yes Historical Provider, MD  metoprolol succinate (TOPROL-XL) 25 MG 24 hr tablet Take 1 tablet (25 mg total) by mouth daily. 11/27/11  Yes Nelwyn Salisbury, MD  Multiple Vitamin  (MULTIVITAMIN WITH MINERALS) TABS Take 1 tablet by mouth daily.   Yes Historical Provider, MD  omeprazole (PRILOSEC) 40 MG capsule Take 1 capsule (40 mg total) by mouth daily. 11/27/11  Yes Nelwyn Salisbury, MD  prasugrel (EFFIENT) 10 MG TABS Take 1 tablet (10 mg total) by mouth daily. 11/27/11  Yes Nelwyn Salisbury, MD  predniSONE (DELTASONE) 10 MG tablet Take 40 mg by mouth daily. For 5 days Started 1/11   Yes Historical Provider, MD  senna-docusate (SB DOCUSATE SODIUM/SENNA) 8.6-50 MG per tablet Take 1 tablet by mouth daily as needed. For constipation   Yes Historical Provider, MD  simvastatin (ZOCOR) 40 MG tablet Take 1 tablet (40 mg total) by mouth at bedtime. 11/27/11  Yes Nelwyn Salisbury, MD  Tamsulosin HCl (FLOMAX) 0.4 MG CAPS Take 1 capsule (0.4 mg total) by mouth daily. 11/27/11  Yes Nelwyn Salisbury, MD  nitroGLYCERIN (NITROSTAT) 0.4 MG SL tablet Place 1 tablet (0.4 mg total) under the tongue every 5 (five) minutes as needed. 11/27/11   Nelwyn Salisbury, MD   Physical Exam: Filed Vitals:   02/20/12 1222 02/20/12 1245 02/20/12 1314 02/20/12 1533  BP: 102/78 132/83    Pulse: 93 71    Temp:      Height:    6\' 1"  (1.854 m)  Weight:    99.79 kg (220 lb)  SpO2:  95% 100%     General: Alert, Well-developed, well-nourished, pleasant and cooperative in NAD.  Head: Normocephalic and atraumatic.  Eyes: Sclera clear, no icterus. Conjunctiva pink.  Ears: Normal auditory acuity.  Mouth: No deformity or lesions.  Lungs: Clear throughout to auscultation. No wheezes, crackles, or rhonchi.  Heart: Regular rate and rhythm; no murmurs, clicks, rubs, or gallops.  Abdomen: Soft ,non-distended, BS active, mild lower abdominal TTP without rebound.  Rectal: (Performed by ED and also GI practioneer); Revealed dried red blood around the anus. Dark red blood seen on exam glove. Non-tender on exam.  Msk: Symmetrical without gross deformities. FROM, no erythema or joint swelling Pulses: Normal pulses noted.    Extremities: Without clubbing or edema.  Neurologic: Alert and oriented x4; CN grossly normal on exam; no motor or sensory deficit appreciated..  Skin: Intact without significant lesions or rashes.  Psych: Alert and cooperative. Normal mood and affect.   Labs on Admission:  Basic Metabolic Panel:  Lab 02/20/12 0454  NA 140  K 3.7  CL 105  CO2 24  GLUCOSE 123*  BUN 29*  CREATININE 0.97  CALCIUM 10.1  MG --  PHOS --   Liver Function Tests:  Lab 02/20/12 1024  AST 16  ALT 13  ALKPHOS 49  BILITOT 0.5  PROT 7.4  ALBUMIN 3.7   CBC:  Lab 02/20/12 1419 02/20/12 1024 02/15/12 1518  WBC 9.9 11.8* 11.7*  NEUTROABS -- 6.2 8.5*  HGB 11.0* 13.4 13.9  HCT 32.7* 39.3 41.1  MCV 86.5 85.8 85.5  PLT 251 352 290.0   Cardiac Enzymes:  Lab 02/20/12 1119  CKTOTAL --  CKMB --  CKMBINDEX --  TROPONINI <0.30    EKG: no acute ischemic changes appreciated.  Assessment/Plan 1-GI bleeding: appears to be lower GI in etiology; patient with hx of recent knee surgery and was using pain medications and got severely constipated; finally relieved with the use of OTC laxatives; most likely erosion of diverticular disease from hard stool. Patient also a risk for bleeding as he is on ASA, prasugrel and recently started on high dose steroids. Even low GI bleed is most likely, he had hx of GERD/barrett's esophagus and with steroids, ASA and prasugrel on board we could be seen high transit upper GI bleed as well (but patient is hemodynamically to stable for this option).  -Will admit to telemetry -CLD -IV protonix -Cycle CBC to follow Hgb (so far despite acute bleeding is 11.0) -Follow GI recommendations. -If bleeding continue or worsen might need EGD/Colonoscopy or even bleeding scan. -Will stop prednisone, ASA and prasugrel at this moment.   2-COLONIC POLYPS: no malignancy seen on last polyps removal by [facility]. Will follow.  3-GERD and BARRETTS ESOPHAGUS: continue  PPI.  4-DIVERTICULOSIS, COLON: hx of diverticular disease; most likely cause of bleeding. Will follow GI recommendations.  5-COPD (chronic obstructive pulmonary disease): stable. No wheezing.  6-DVT: SCD's  *  GI has been consulted by ED; and they will follow patient along.  Code Status: Full Family Communication: No family at bedside Disposition Plan: admit to telemetry for further evaluation and treatment of his GI bleeding. Hemodynamically stable at this point.    Time spent: > 30 minutes  Shloimy Michalski [facility] Pager [phone]  If 7PM-7AM, please contact night-coverage [email or web address] Password [facility] 02/20/2012, 4:28 PM

## 2012-02-20 NOTE — ED Provider Notes (Signed)
This chart was scribed for No att. providers found by Bennett Scrape, ED Scribe. This patient was seen in room A11C/A11C and the patient's care was started at 11:45 AM.  Tyrone Roman is a 67 y.o. male who presents to the Emergency Department complaining of sudden onset, non-changing 7 to 8 episodes of rectal bleeding described as bright red blood with small clots with associated mild abdominal cramping on the left-sided that he noticed upon waking. He denies having prior episodes of similar symptoms. He denies being on blood thinners currently. He reports a recent knee surgery but denies being on blood thinners currently. He denies CP, lightheadedness, dizziness and emesis.  Was recently diagnosed with PMR and was started on prednisone.  PCP is Dr. Freada Bergeron. Has seen Dr. Arlyce Dice before.  PE ABODMEN: Soft, minimal left-sided tenderness  11:48 AM- Advised pt to hold off on taking prednisone. Discussed treatment plan and pt agreed to plan.   1:13 PM- Consult complete with Dr. Gwenlyn Perking. Patient case explained and discussed. Dr. Gwenlyn Perking agrees to admit patient for further evaluation and treatment to tele team 5. Call ended at 1:14 PM.   BRBPR since this morning with history of diverticulosis. On effient and ASA. Hemoglobin stable. D/w [facility].  I personally performed the services described in this documentation, which was scribed in my presence. The recorded information has been reviewed and is accurate.     Glynn Octave, MD 02/20/12 1409

## 2012-02-20 NOTE — Consult Note (Signed)
Referring Provider: No ref. provider found Primary Care Physician:  Nelwyn Salisbury, MD Primary Gastroenterologist:  Dr. Arlyce Dice  Reason for Consultation:  GIB  HPI: Tyrone Roman is a 67 y.o. male here for the sudden onset this am of passing moderate amounts of bright red blood per rectum. He is bleeding both with and without BMs.  Says that he woke up in a puddle of blood and has passed moderate amounts of blood 9 times since this morning.  Was seen by his PCP who sent him to the ED.  He admits to lower abdominal cramping and pressure but no pain.  Denies nausea, vomiting, fevers, decreased appetite or weight loss.  Is on a baby asa, effient, and prednisone 40 mg (has only been on this for 5 days) daily at home; has not taken any medication today.  Hgb was 13.4 grams initially this AM.  He had a colonoscopy in 02/2009 per Dr. Arlyce Dice showing moderate diverticulosis and two benign polyps (TA's). He had an EGD last month showing only benign inflammation on esophageal biopsies (has history of Barretts).  Is on omeprazole 40 mg daily at home as well.  Never had similar episodes to this in the past.  Past Medical History  Diagnosis Date  . CAD (coronary artery disease)     sees Dr. Juanito Doom  . GERD (gastroesophageal reflux disease)   . Barrett's esophageal ulceration   . BPH (benign prostatic hyperplasia)   . Colonic polyp   . Hyperlipidemia   . Nephrolithiasis   . Diverticulosis of colon   . COPD (chronic obstructive pulmonary disease)     sees Dr. Cyril Mourning   . Arthritis   . Hypertension   . Myocardial infarction     2011- stent placed    Past Surgical History  Procedure Date  . Tonsillectomy   . Left knee surgery   . Colonoscopy 03/01/09    per Dr. Arlyce Dice; had benign polyps, repeat 5 years   . Egd with dilatation 02/17/09    Barretts esophagus   . Heart stent     Prior to Admission medications   Medication Sig Start Date End Date Taking? Authorizing Provider  aspirin EC 81 MG  tablet Take 81 mg by mouth daily.   Yes Historical Provider, MD  cetirizine (ZYRTEC) 10 MG tablet Take 10 mg by mouth daily.   Yes Historical Provider, MD  HYDROcodone-acetaminophen (NORCO/VICODIN) 5-325 MG per tablet Take 1-2 tablets by mouth every 4 (four) hours as needed. For pain   Yes Historical Provider, MD  metoprolol succinate (TOPROL-XL) 25 MG 24 hr tablet Take 1 tablet (25 mg total) by mouth daily. 11/27/11  Yes Nelwyn Salisbury, MD  Multiple Vitamin (MULTIVITAMIN WITH MINERALS) TABS Take 1 tablet by mouth daily.   Yes Historical Provider, MD  omeprazole (PRILOSEC) 40 MG capsule Take 1 capsule (40 mg total) by mouth daily. 11/27/11  Yes Nelwyn Salisbury, MD  prasugrel (EFFIENT) 10 MG TABS Take 1 tablet (10 mg total) by mouth daily. 11/27/11  Yes Nelwyn Salisbury, MD  predniSONE (DELTASONE) 10 MG tablet Take 40 mg by mouth daily. For 5 days Started 1/11   Yes Historical Provider, MD  senna-docusate (SB DOCUSATE SODIUM/SENNA) 8.6-50 MG per tablet Take 1 tablet by mouth daily as needed. For constipation   Yes Historical Provider, MD  simvastatin (ZOCOR) 40 MG tablet Take 1 tablet (40 mg total) by mouth at bedtime. 11/27/11  Yes Nelwyn Salisbury, MD  Tamsulosin HCl [facility])  0.4 MG CAPS Take 1 capsule (0.4 mg total) by mouth daily. 11/27/11  Yes Nelwyn Salisbury, MD  nitroGLYCERIN (NITROSTAT) 0.4 MG SL tablet Place 1 tablet (0.4 mg total) under the tongue every 5 (five) minutes as needed. 11/27/11   Nelwyn Salisbury, MD    No current facility-administered medications for this encounter.   Current Outpatient Prescriptions  Medication Sig Dispense Refill  . aspirin EC 81 MG tablet Take 81 mg by mouth daily.      . cetirizine (ZYRTEC) 10 MG tablet Take 10 mg by mouth daily.      Marland Kitchen HYDROcodone-acetaminophen (NORCO/VICODIN) 5-325 MG per tablet Take 1-2 tablets by mouth every 4 (four) hours as needed. For pain      . metoprolol succinate (TOPROL-XL) 25 MG 24 hr tablet Take 1 tablet (25 mg total) by mouth daily.   90 tablet  3  . Multiple Vitamin (MULTIVITAMIN WITH MINERALS) TABS Take 1 tablet by mouth daily.      Marland Kitchen omeprazole (PRILOSEC) 40 MG capsule Take 1 capsule (40 mg total) by mouth daily.  90 capsule  3  . prasugrel (EFFIENT) 10 MG TABS Take 1 tablet (10 mg total) by mouth daily.  90 each  3  . predniSONE (DELTASONE) 10 MG tablet Take 40 mg by mouth daily. For 5 days Started 1/11      . senna-docusate (SB DOCUSATE SODIUM/SENNA) 8.6-50 MG per tablet Take 1 tablet by mouth daily as needed. For constipation      . simvastatin (ZOCOR) 40 MG tablet Take 1 tablet (40 mg total) by mouth at bedtime.  90 tablet  3  . Tamsulosin HCl (FLOMAX) 0.4 MG CAPS Take 1 capsule (0.4 mg total) by mouth daily.  90 capsule  3  . nitroGLYCERIN (NITROSTAT) 0.4 MG SL tablet Place 1 tablet (0.4 mg total) under the tongue every 5 (five) minutes as needed.  50 tablet  3    Allergies as of 02/20/2012 - Review Complete 02/20/2012  Allergen Reaction Noted  . Cephalexin Shortness Of Breath and Rash 08/04/2010  . Clarithromycin Shortness Of Breath and Rash 11/04/2006  . Lidoderm  08/04/2010    Family History  Problem Relation Age of Onset  . Heart attack Father     cardiovascular disorder  . Arthritis Father     family hx  . Colon cancer Father     mets  . Prostate cancer Father     1st degree relative  . Arthritis Mother   . Diabetes Paternal Uncle   . Cancer Maternal Grandmother   . Skin cancer Daughter   . Esophageal cancer Neg Hx   . Stomach cancer Neg Hx   . Rectal cancer Neg Hx     History   Social History  . Marital Status: Divorced    Spouse Name: N/A    Number of Children: 2  . Years of Education: N/A   Occupational History  . ENGINEER    Social History Main Topics  . Smoking status: Former Smoker -- 1.0 packs/day for 20 years    Types: Cigarettes    Quit date: 10/12/1988  . Smokeless tobacco: Never Used     Comment: 1960's   . Alcohol Use: No  . Drug Use: No  . Sexually Active: Not on  file   Other Topics Concern  . 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  Review of Systems: Ten point ROS is O/W negative except as mentioned in HPI.  Physical Exam: Vital signs in last 24 hours: Temp:  [97.9 F (36.6 C)] 97.9 F (36.6 C) (01/15 1019) Pulse Rate:  [100-115] 100  (01/15 1019) BP: (110-127)/(74-78) 127/78 mmHg (01/15 1019) SpO2:  [98 %-99 %] 99 % (01/15 1019) Weight:  [220 lb (99.791 kg)] 220 lb (99.791 kg) (01/15 0858)   General:   Alert,  Well-developed, well-nourished, pleasant and cooperative in NAD. Head:  Normocephalic and atraumatic. Eyes:  Sclera clear, no icterus.  Conjunctiva pink. Ears:  Normal auditory acuity. Mouth:  No deformity or lesions.   Lungs:  Clear throughout to auscultation.  No wheezes, crackles, or rhonchi.  Heart:  Regular rate and rhythm; no murmurs, clicks, rubs,  or gallops. Abdomen:  Soft ,non-distended, BS active, mild lower abdominal TTP without rebound.   Rectal:  Revealed dried red blood around the anus.  Dark red blood seen on exam glove.  Non-tender on exam. Msk:  Symmetrical without gross deformities. Pulses:  Normal pulses noted. Extremities:  Without clubbing or edema. Neurologic:  Alert and  oriented x4;  grossly normal neurologically. Skin:  Intact without significant lesions or rashes. Psych:  Alert and cooperative. Normal mood and affect.  Lab Results:  Basename 02/20/12 1024  WBC 11.8*  HGB 13.4  HCT 39.3  PLT 352   BMET  Basename 02/20/12 1024  NA 140  K 3.7  CL 105  CO2 24  GLUCOSE 123*  BUN 29*  CREATININE 0.97  CALCIUM 10.1   LFT  Basename 02/20/12 1024  PROT 7.4  ALBUMIN 3.7  AST 16  ALT 13  ALKPHOS 49  BILITOT 0.5  BILIDIR --  IBILI --   PT/INR  Basename 02/20/12 1024  LABPROT 13.3  INR 1.02   IMPRESSION:  -GIB:  Likely lower in origin from diverticulosis. -On anti-platelet agent,  Effient for CAD/stent.  PLAN: -Patient will be admitted to medicine's service.  We will continue to monitor for now, but if continues to bleed briskly then may need a bleeding scan.  Hgb is ok for now but I suspect that it will drop; transfuse prn.    ZEHR, JESSICA D.  02/20/2012, 11:54 AM  Pager number [phone]  Chart was reviewed and patient was examined. X-rays and lab were reviewed.    I agree with management and plans.  Suspect diverticular bleed exacerbated by antiplatelet meds.  Recent steroid use is probably incidental.   Recommend 1.  Hold effient for now 2.  If bleeding increases proceed with bleeding scan 3.  Keep Hg 8-9  Robert D. Arlyce Dice, M.D., [facility] Cell [phone]

## 2012-02-20 NOTE — Progress Notes (Signed)
NURSING PROGRESS NOTE  Tyrone Roman [id number] Admission Data: 02/20/2012 5:14 PM Attending Provider: Vassie Loll, MD PCP:FRY,STEPHEN A, MD Code Status: full   Tyrone Roman is a 67 y.o. male patient admitted from ED  No acute distress noted.  No c/o shortness of breath, no c/o chest pain.  Cardiac tele # [phone], in place, cardiac monitor yields:normal sinus rhythm.  Blood pressure 132/83, pulse 71, temperature 97.9 F (36.6 C), height 6\' 1"  (1.854 m), weight 99.79 kg (220 lb), SpO2 100.00%.   IV Fluids:  IV in place, occlusive dsg intact without redness, IV cath forearm left, condition patent and no redness and antecubital left, condition patent and no redness normal saline.   Allergies:  Cephalexin; Clarithromycin; and Lidoderm  Past Medical History:   has a past medical history of CAD (coronary artery disease); GERD (gastroesophageal reflux disease); Barrett's esophageal ulceration; BPH (benign prostatic hyperplasia); Colonic polyp; Hyperlipidemia; SOB (shortness of breath); Dysphagia; Pre-operative cardiovascular examination; Contact with or exposure to venereal diseases; Laceration of scalp; Concussion with loss of consciousness of 30 minutes or less; Nephrolithiasis; Diverticulosis of colon; Contact dermatitis; COPD (chronic obstructive pulmonary disease); Arthritis; Hypertension; and Myocardial infarction.  Past Surgical History:   has past surgical history that includes Tonsillectomy; Left knee surgery; Colonoscopy (03/01/09); EGD with dilatation (02/17/09); heart stent; and right knee surgery.  Social History:   reports that he quit smoking about 23 years ago. His smoking use included Cigarettes. He has a 20 pack-year smoking history. He has never used smokeless tobacco. He reports that he does not drink alcohol or use illicit drugs.  Skin: intact  Orientation to room, and floor completed with information packet given to patient/family. Admission INP armband ID verified with  patient/family, and in place.   SR up x 2, fall assessment complete, with patient and family able to verbalize understanding of risk associated with falls, and verbalized understanding to call for assistance before getting out of bed.   Call light within reach. Patient able to voice and demonstrate understanding of unit orientation instructions.   Will cont to eval and treat per MD orders.  Ayelen Sciortino, Elmarie Mainland, RN

## 2012-02-20 NOTE — ED Notes (Signed)
Woke up and passed blood thru rectum multiple times large amts saw dr fry this am and was told come here and have dr Arlyce Dice paged

## 2012-02-20 NOTE — Progress Notes (Signed)
  Subjective:    Patient ID: Tyrone Roman, male    DOB: 12-Apr-1945, 67 y.o.   MRN: [id number]  HPI Here for the sudden onset this am of passing moderate amounts of bright red blood per rectum. He is bleeding both with and without BMs. He denies any abdominal pain or nausea or fever. He was here 4 days ago for diffuse myalgias which we felt were consistent with PMR. He has been taking 40 mg a day of Prednisone, and he has had a great deal of improvement as far as his pain goes. He had a colonoscopy in 2011 per Dr. Arlyce Dice showing moderate diverticulosis and some benign polyps. He had an EGD last month showing only benign inflammation. His CBC 4 days ago showed a WBC count of 11.7 and a Hgb of 13.9.    Review of Systems  Constitutional: Negative.   Respiratory: Negative.   Cardiovascular: Negative.   Gastrointestinal: Positive for blood in stool and anal bleeding. Negative for nausea, vomiting, abdominal pain, diarrhea, constipation, abdominal distention and rectal pain.       Objective:   Physical Exam  Constitutional: He appears well-developed and well-nourished. No distress.  Cardiovascular: Normal rate, regular rhythm, normal heart sounds and intact distal pulses.   Pulmonary/Chest: Effort normal and breath sounds normal.  Abdominal: Soft. Bowel sounds are normal. He exhibits no distension and no mass. There is no rebound and no guarding.       Slightly tender in the LLQ          Assessment & Plan:  Lower GI bleeding, most likely of diverticular origin. We will have his friend drive him to [facility] ER immediately for further treatment. I spoke with Dr. Melvia Heaps, his GI doctor, who will be consulted to see him by the ER staff.

## 2012-02-20 NOTE — ED Provider Notes (Signed)
History     CSN: [id number]  Arrival date & time 02/20/12  1008   None    CC: Rectal bleeding  (Consider location/radiation/quality/duration/timing/severity/associated sxs/prior treatment) Patient is a 67 y.o. male presenting with hematochezia. The history is provided by the patient.  Rectal Bleeding  The stool is described as bloody. Associated symptoms include abdominal pain. Pertinent negatives include no fever, no chest pain and no headaches.   This morning pt woke up and noticed wetness in his rectal area, went to the bathroom and had copious rectal bleeidng. Had a BM with lots of blood in it. Reports "gushing" BRPBR. He drove to PCP Clent Ridges) who saw pt and sent him here to the ER. PCP has already notifed pt's GI doctor, Dr. Arlyce Dice, that he was coming. Has no pain in his rectum. Had a hemorrhoid years ago but no recent hemorrhoids. He does report some slight abdominal pain. Ate a normal dinner last night, has only had water today. Has not had any significant lightheadedness.  Pt has never had a history of rectal bleeding but says he was diagnosed with diverticulosis ~20 years ago. Has lost 25lb in last month, without trying to lose weight. Family hx + for colon cancer in father, who died at age 30 after presenting similarly with rectal hemorrhage. Pt was recently dx'd with polymyalgia rheumatica (pending referral to rheumatology) and has been on hydrocodone & prednisone, and has been constipated from these medications for the last few days. He took some laxatives and had a BM yesterday morning, that was not bloody.   Recent arthroscopic knee surgery in December by Dr. Evelena Asa in [place].   Past Medical History  Diagnosis Date  . CAD (coronary artery disease)     sees Dr. Juanito Doom  . GERD (gastroesophageal reflux disease)   . Barrett's esophageal ulceration   . BPH (benign prostatic hyperplasia)   . Colonic polyp   . Hyperlipidemia   . SOB (shortness of breath)   . Dysphagia   .  Pre-operative cardiovascular examination   . Contact with or exposure to venereal diseases   . Laceration of scalp   . Concussion with loss of consciousness of 30 minutes or less   . Nephrolithiasis   . Diverticulosis of colon   . Contact dermatitis   . COPD (chronic obstructive pulmonary disease)     sees Dr. Cyril Mourning   . Arthritis   . Hypertension   . Myocardial infarction     2011- stent placed    Past Surgical History  Procedure Date  . Tonsillectomy   . Left knee surgery   . Colonoscopy 03/01/09    per Dr. Arlyce Dice; had benign polyps, repeat 5 years   . Egd with dilatation 02/17/09    Barretts esophagus   . Heart stent     Family History  Problem Relation Age of Onset  . Heart attack Father     cardiovascular disorder  . Arthritis Father     family hx  . Colon cancer Father     mets  . Prostate cancer Father     1st degree relative  . Arthritis Mother   . Diabetes Paternal Uncle   . Cancer Maternal Grandmother   . Skin cancer Daughter   . Esophageal cancer Neg Hx   . Stomach cancer Neg Hx   . Rectal cancer Neg Hx     History  Substance Use Topics  . Smoking status: Former Smoker -- 1.0 packs/day for 20 years  Types: Cigarettes    Quit date: 10/12/1988  . Smokeless tobacco: Never Used     Comment: 1960's   . Alcohol Use: No     Review of Systems  Constitutional: Positive for unexpected weight change. Negative for fever and diaphoresis.       25 lb weight loss over last month  HENT:       + for chronic neck pain due to degenerative disc disease  Eyes: Positive for visual disturbance.       Some blurred vision over last few days  Respiratory: Positive for shortness of breath.        Mild chronic SOB  Cardiovascular: Negative for chest pain.  Gastrointestinal: Positive for abdominal pain, constipation, blood in stool, hematochezia and anal bleeding.  Musculoskeletal: Positive for myalgias.  Neurological: Negative for headaches.    Allergies    Cephalexin; Clarithromycin; and Lidoderm  Home Medications   Current Outpatient Rx  Name  Route  Sig  Dispense  Refill  . ASPIRIN EC 81 MG PO TBEC   Oral   Take 81 mg by mouth daily.         Marland Kitchen CETIRIZINE HCL 10 MG PO TABS   Oral   Take 10 mg by mouth daily.         Marland Kitchen HYDROCODONE-ACETAMINOPHEN 5-325 MG PO TABS   Oral   Take 1-2 tablets by mouth every 4 (four) hours as needed. For pain         . METOPROLOL SUCCINATE ER 25 MG PO TB24   Oral   Take 1 tablet (25 mg total) by mouth daily.   90 tablet   3   . ADULT MULTIVITAMIN W/MINERALS CH   Oral   Take 1 tablet by mouth daily.         Marland Kitchen OMEPRAZOLE 40 MG PO CPDR   Oral   Take 1 capsule (40 mg total) by mouth daily.   90 capsule   3   . PRASUGREL HCL 10 MG PO TABS   Oral   Take 1 tablet (10 mg total) by mouth daily.   90 each   3   . PREDNISONE 10 MG PO TABS   Oral   Take 40 mg by mouth daily. For 5 days Started 1/11         . SENNOSIDES-DOCUSATE SODIUM 8.6-50 MG PO TABS   Oral   Take 1 tablet by mouth daily as needed. For constipation         . SIMVASTATIN 40 MG PO TABS   Oral   Take 1 tablet (40 mg total) by mouth at bedtime.   90 tablet   3   . TAMSULOSIN HCL 0.4 MG PO CAPS   Oral   Take 1 capsule (0.4 mg total) by mouth daily.   90 capsule   3   . NITROGLYCERIN 0.4 MG SL SUBL   Sublingual   Place 1 tablet (0.4 mg total) under the tongue every 5 (five) minutes as needed.   50 tablet   3     BP 127/78  Pulse 100  Temp 97.9 F (36.6 C)  SpO2 99%  Physical Exam  Constitutional: He is oriented to person, place, and time. He appears well-developed.  HENT:  Head: Normocephalic and atraumatic.  Mouth/Throat: Oropharynx is clear and moist.  Eyes: Pupils are equal, round, and reactive to light.  Cardiovascular: Normal rate, regular rhythm and normal heart sounds.   Pulmonary/Chest: Effort normal and breath sounds normal.  Abdominal: Soft. Bowel sounds are normal. He exhibits no  distension and no mass. There is tenderness. There is no rebound and no guarding.       Tender to palpation in LLQ > RLQ.   Musculoskeletal:       No pretibial edema bilaterally.  Neurological: He is alert and oriented to person, place, and time.  Skin: Skin is warm and dry. No rash noted.  Psychiatric: He has a normal mood and affect. His behavior is normal.    ED Course  Procedures (including critical care time)  Labs Reviewed  CBC WITH DIFFERENTIAL - Abnormal; Notable for the following:    WBC 11.8 (*)     Lymphs Abs 4.3 (*)     Monocytes Absolute 1.1 (*)     All other components within normal limits  COMPREHENSIVE METABOLIC PANEL - Abnormal; Notable for the following:    Glucose, Bld 123 (*)     BUN 29 (*)     GFR calc non Af Amer 84 (*)     All other components within normal limits  TYPE AND SCREEN  PROTIME-INR  TROPONIN I  ABO/RH  CBC   No results found.   1. GI bleeding     MDM   Pt with new onset rectal bleeding, with known hx of diverticulosis. Hgb stable at 13.4, WBC mildly elevated at 11.8. Will consult pt's gastroenterologist (Dr. Arlyce Dice) for evaluation and recommendation.  Per GI, will be admitted to the hospital.  Latrelle Dodrill, MD 02/20/12 [id number]

## 2012-02-20 NOTE — ED Provider Notes (Signed)
Medical screening examination/treatment/procedure(s) were conducted as a shared visit with non-physician practitioner(s) and myself.  I personally evaluated the patient during the encounter  See my additional note  Glynn Octave, MD 02/20/12 1609

## 2012-02-20 NOTE — ED Notes (Signed)
Pain in left lower abd

## 2012-02-20 NOTE — ED Notes (Signed)
PA at bedside. Gastroenterology

## 2012-02-21 DIAGNOSIS — I10 Essential (primary) hypertension: Secondary | ICD-10-CM | POA: Diagnosis not present

## 2012-02-21 DIAGNOSIS — E785 Hyperlipidemia, unspecified: Secondary | ICD-10-CM

## 2012-02-21 DIAGNOSIS — K922 Gastrointestinal hemorrhage, unspecified: Secondary | ICD-10-CM | POA: Diagnosis not present

## 2012-02-21 DIAGNOSIS — I2510 Atherosclerotic heart disease of native coronary artery without angina pectoris: Secondary | ICD-10-CM | POA: Diagnosis not present

## 2012-02-21 LAB — COMPREHENSIVE METABOLIC PANEL
ALT: 11 U/L (ref 0–53)
AST: 14 U/L (ref 0–37)
Albumin: 2.9 g/dL — ABNORMAL LOW (ref 3.5–5.2)
Alkaline Phosphatase: 40 U/L (ref 39–117)
BUN: 18 mg/dL (ref 6–23)
CO2: 27 mEq/L (ref 19–32)
Calcium: 9.2 mg/dL (ref 8.4–10.5)
Chloride: 107 mEq/L (ref 96–112)
Creatinine, Ser: 0.94 mg/dL (ref 0.50–1.35)
GFR calc Af Amer: >90 mL/min (ref >90)
GFR calc non Af Amer: 85 mL/min — ABNORMAL LOW (ref >90)
Glucose, Bld: 99 mg/dL (ref 70–99)
Potassium: 4.4 mEq/L (ref 3.5–5.1)
Sodium: 140 mEq/L (ref 135–145)
Total Bilirubin: 0.5 mg/dL (ref 0.3–1.2)
Total Protein: 5.6 g/dL — ABNORMAL LOW (ref 6.0–8.3)

## 2012-02-21 LAB — CBC
HCT: 29.7 % — ABNORMAL LOW (ref 39.0–52.0)
HCT: 28.3 % — ABNORMAL LOW (ref 39.0–52.0)
Hemoglobin: 10 g/dL — ABNORMAL LOW (ref 13.0–17.0)
Hemoglobin: 9.9 g/dL — ABNORMAL LOW (ref 13.0–17.0)
MCH: 28.9 pg (ref 26.0–34.0)
MCH: 29.8 pg (ref 26.0–34.0)
MCHC: 33.7 g/dL (ref 30.0–36.0)
MCHC: 35 g/dL (ref 30.0–36.0)
MCV: 85.8 fL (ref 78.0–100.0)
MCV: 85.2 fL (ref 78.0–100.0)
Platelets: 240 10*3/uL (ref 150–400)
Platelets: 245 10*3/uL (ref 150–400)
RBC: 3.46 MIL/uL — ABNORMAL LOW (ref 4.22–5.81)
RBC: 3.32 MIL/uL — ABNORMAL LOW (ref 4.22–5.81)
RDW: 13.3 % (ref 11.5–15.5)
RDW: 13 % (ref 11.5–15.5)
WBC: 9 10*3/uL (ref 4.0–10.5)
WBC: 8.9 10*3/uL (ref 4.0–10.5)

## 2012-02-21 LAB — PHOSPHORUS: Phosphorus: 3.1 mg/dL (ref 2.3–4.6)

## 2012-02-21 LAB — MAGNESIUM: Magnesium: 2.1 mg/dL (ref 1.5–2.5)

## 2012-02-21 MED ORDER — PREDNISONE 20 MG PO TABS
30.0000 mg | ORAL_TABLET | Freq: Every day | ORAL | Status: DC
  Administered 2012-02-21 – 2012-02-22 (×2): 30 mg via ORAL
  Filled 2012-02-21 (×3): qty 1

## 2012-02-21 NOTE — Progress Notes (Signed)
No further overt GI bleeding.  Hg stable.  May advance diet.  Observe another 24 hours; if no recurrent bleeding may d/c in am.

## 2012-02-21 NOTE — Progress Notes (Signed)
PATIENT DETAILS Name: Tyrone Roman Age: 67 y.o. Sex: male Date of Birth: 1945/03/26 Admit Date: 02/20/2012 Admitting Physician No admitting provider for patient encounter. PCP:FRY,STEPHEN A, MD  Subjective: Rectal Bleeding seems to have slowed down considerably-inquiring on when he can go home. No major complaints  Assessment/Plan: Lower GI bleeding -Likely Diverticular -Hb dropping slowly -But bleeding seems to have slowed down significantly -continue with CBC every 12 hours -transfuse as needed -will need to continue to stop ASA and Effient-will speak to cards prior to discharge to see if he can come of effient-as last PCI was Nov 2011  Anemia -from acute blood loss -transfuse prn  CAD -anti-platelet agents on hold -will speak to cards prior to discharge to see if he can come of effient-as last PCI was Nov 2011  HTN -controlled without use of anti-hypertensive -resume when possible  Dyslipidemia -c/w Zocor  BPH -c/w Flomax  Polymyalgia Rheumatica -patient was on a tapering dose of prednisone-will resume slow taper  Disposition: Remain inpatient  DVT Prophylaxis: SCD  Code Status: Full code   Procedures:  None  CONSULTS:  GI  PHYSICAL EXAM: Vital signs in last 24 hours: Filed Vitals:   02/20/12 1314 02/20/12 1533 02/20/12 2045 02/21/12 0500  BP:  131/78 109/70 120/90  Pulse:  78 80 76  Temp:  97.1 F (36.2 C) 98.5 F (36.9 C) 97.3 F (36.3 C)  TempSrc:  Oral Oral Oral  Resp:  18 18 13   Height:  6\' 1"  (1.854 m)    Weight:  99.79 kg (220 lb)    SpO2: 100% 100% 97% 94%    Weight change:  Body mass index is 29.03 kg/(m^2).   Gen Exam: Awake and alert with clear speech.   Neck: Supple, No JVD.   Chest: B/L Clear.   CVS: S1 S2 Regular, no murmurs.  Abdomen: soft, BS +, non tender, non distended.  Extremities: no edema, lower extremities warm to touch. Neurologic: Non Focal.   Skin: No Rash.   Wounds: N/A.    Intake/Output from  previous day:  Intake/Output Summary (Last 24 hours) at 02/21/12 1020 Last data filed at 02/20/12 2317  Gross per 24 hour  Intake    483 ml  Output      0 ml  Net    483 ml     LAB RESULTS: CBC  Lab 02/21/12 0635 02/20/12 1754 02/20/12 1419 02/20/12 1024 02/15/12 1518  WBC 9.0 13.7* 9.9 11.8* 11.7*  HGB 10.0* 10.6* 11.0* 13.4 13.9  HCT 29.7* 31.6* 32.7* 39.3 41.1  PLT 240 246 251 352 290.0  MCV 85.8 86.6 86.5 85.8 85.5  MCH 28.9 29.0 29.1 29.3 --  MCHC 33.7 33.5 33.6 34.1 33.8  RDW 13.3 13.3 13.3 13.5 13.8  LYMPHSABS -- -- -- 4.3* 1.8  MONOABS -- -- -- 1.1* 0.9  EOSABS -- -- -- 0.2 0.5  BASOSABS -- -- -- 0.1 0.0  BANDABS -- -- -- -- --    Chemistries   Lab 02/21/12 0635 02/20/12 1024  NA 140 140  K 4.4 3.7  CL 107 105  CO2 27 24  GLUCOSE 99 123*  BUN 18 29*  CREATININE 0.94 0.97  CALCIUM 9.2 10.1  MG 2.1 --    CBG: No results found for this basename: GLUCAP:5 in the last 168 hours  GFR Estimated Creatinine Clearance: 96.1 ml/min (by C-G formula based on Cr of 0.94).  Coagulation profile  Lab 02/20/12 1024  INR 1.02  PROTIME --  Cardiac Enzymes  Lab 02/20/12 1119  CKMB --  TROPONINI <0.30  MYOGLOBIN --    No components found with this basename: POCBNP:3 No results found for this basename: DDIMER:2 in the last 72 hours No results found for this basename: HGBA1C:2 in the last 72 hours No results found for this basename: CHOL:2,HDL:2,LDLCALC:2,TRIG:2,CHOLHDL:2,LDLDIRECT:2 in the last 72 hours No results found for this basename: TSH,T4TOTAL,FREET3,T3FREE,THYROIDAB in the last 72 hours No results found for this basename: VITAMINB12:2,FOLATE:2,FERRITIN:2,TIBC:2,IRON:2,RETICCTPCT:2 in the last 72 hours No results found for this basename: LIPASE:2,AMYLASE:2 in the last 72 hours  Urine Studies No results found for this basename:  UACOL:2,UAPR:2,USPG:2,UPH:2,UTP:2,UGL:2,UKET:2,UBIL:2,UHGB:2,UNIT:2,UROB:2,ULEU:2,UEPI:2,UWBC:2,URBC:2,UBAC:2,CAST:2,CRYS:2,UCOM:2,BILUA:2 in the last 72 hours  MICROBIOLOGY: No results found for this or any previous visit (from the past 240 hour(s)).  RADIOLOGY STUDIES/RESULTS: No results found.  MEDICATIONS: Scheduled Meds:   . sodium chloride   Intravenous STAT  . loratadine  10 mg Oral Daily  . multivitamin with minerals  1 tablet Oral Daily  . pantoprazole (PROTONIX) IV  40 mg Intravenous Q12H  . predniSONE  30 mg Oral Q breakfast  . simvastatin  40 mg Oral QHS  . sodium chloride  3 mL Intravenous Q12H  . Tamsulosin HCl  0.4 mg Oral Daily   Continuous Infusions:  PRN Meds:.acetaminophen, acetaminophen, HYDROcodone-acetaminophen, morphine injection, nitroGLYCERIN, ondansetron (ZOFRAN) IV, ondansetron, zolpidem  Antibiotics: Anti-infectives    None       Jeoffrey Massed, MD  [facility] Pager:[phone]  If 7PM-7AM, please contact night-coverage [email or web address] Password [email or web address] 02/21/2012, 10:20 AM   LOS: 1 day

## 2012-02-21 NOTE — Plan of Care (Signed)
Problem: Phase I Progression Outcomes Goal: Initial discharge plan identified Outcome: Completed/Met Date Met:  02/21/12 To return home

## 2012-02-21 NOTE — Progress Notes (Signed)
[facility] Progress Note  SUBJECTIVE: feels fine, no further bleeding  OBJECTIVE:  Vital signs in last 24 hours: Temp:  [97.1 F (36.2 C)-98.5 F (36.9 C)] 97.3 F (36.3 C) (01/16 0500) Pulse Rate:  [71-93] 76  (01/16 0500) Resp:  [13-18] 13  (01/16 0500) BP: (102-132)/(65-90) 120/90 mmHg (01/16 0500) SpO2:  [94 %-100 %] 94 % (01/16 0500) FiO2 (%):  [100 %] 100 % (01/15 1314) Weight:  [220 lb (99.79 kg)] 220 lb (99.79 kg) (01/15 1533) Last BM Date: 02/20/12 General:    Pleasant white male in NAD Heart:  Regular rate and rhythm Abdomen:  Soft, nontender and nondistended. Normal bowel sounds. Extremities:  Without edema. Neurologic:  Alert and oriented,  grossly normal neurologically. Psych:  Cooperative. Normal mood and affect.   Lab Results:  Basename 02/21/12 0635 02/20/12 1754 02/20/12 1419  WBC 9.0 13.7* 9.9  HGB 10.0* 10.6* 11.0*  HCT 29.7* 31.6* 32.7*  PLT 240 246 251   BMET  Basename 02/21/12 0635 02/20/12 1024  NA 140 140  K 4.4 3.7  CL 107 105  CO2 27 24  GLUCOSE 99 123*  BUN 18 29*  CREATININE 0.94 0.97  CALCIUM 9.2 10.1   LFT  Basename 02/21/12 0635  PROT 5.6*  ALBUMIN 2.9*  AST 14  ALT 11  ALKPHOS 40  BILITOT 0.5  BILIDIR --  IBILI --   PT/INR  Basename 02/20/12 1024  LABPROT 13.3  INR 1.02     ASSESSMENT / PLAN:  1.  Painless hematochezia, likely diverticular hemorrhage in setting of Effient. Resolved. Going home this am. Hgb stable at 10.0. Hospitalist was unable to reach Dr. Daleen Squibb regardingl regarding Effient but he will call again. Plan for now is to restart ASA, hold Effient for a week pending recommendations from Dr. Daleen Squibb   2.  CAD / stents. On Effient at home    LOS: 1 day   Willette Cluster  02/21/2012, 10:21 AM

## 2012-02-21 NOTE — Progress Notes (Signed)
INITIAL NUTRITION ASSESSMENT  DOCUMENTATION CODES Per approved criteria  --Not Applicable   INTERVENTION: 1. Provide patient with 8 oz. Resource Breeze TID which will provide 750 kcal and 27 gm protein 2. RD will continue to follow  NUTRITION DIAGNOSIS: Unintentional weight loss related to decreased appetite as evidenced by 25 lb weight loss (18% body weight) in 11 months.   Goal: Meet >/=90% estimated needs  Monitor:  Acceptance of Resource Breeze, po's, weight trends  Reason for Assessment: Malnutrition Screening Tool  67 y.o. male  Admitting Dx: Bright Red Blood Per Rectum   ASSESSMENT: Pt admitted with moderate amounts of bright red blood being expelled from the rectal area. The pt had a colonoscopy 02/2009, hx of moderate diverticulosis, and two benign rectal polyps. An EGD was conducted 01/2012  Spoke with the pt about current appetite and weight trends.  Pt stated that he has felt very hungry recently, but that he was unsure if he could have grape juice on his current diet (CL). Assured that grape juice was acceptable.   Pt stated that his weight has been fluctuating but his approximate usual weight is 230 lb. Previous weights taken at office visits show weights ranging from 220 to 245.  Asked the patient if he would be interested in trying Resource Breeze in order to provide him with more protein to maintain muscle mass and to prevent the loss of too much weight in a short period of time. Pt was accepting and willing to try this supplement  Pt states weight loss likely related to recent knee surgery, decreased appetite with pain medications. Expect good return of appetite with diet advance. Some weight loss would be appropriate for this pt given BMI.   Height: Ht Readings from Last 1 Encounters:  02/20/12 6\' 1"  (1.854 m)    Weight: Wt Readings from Last 1 Encounters:  02/20/12 220 lb (99.79 kg)  Weight has been trending down since approximately 03/2011. Weight loss  has been unintentional  Ideal Body Weight: 184 lb  % Ideal Body Weight: 119.5%  Wt Readings from Last 10 Encounters:  02/20/12 220 lb (99.79 kg)  02/20/12 220 lb (99.791 kg)  02/15/12 220 lb (99.791 kg)  12/31/11 229 lb (103.874 kg)  12/20/11 229 lb (103.874 kg)  11/27/11 230 lb (104.327 kg)  10/25/11 237 lb (107.502 kg)  04/05/11 245 lb (111.131 kg)  11/16/10 240 lb 9.6 oz (109.135 kg)  10/13/10 242 lb 9.6 oz (110.043 kg)    Usual Body Weight: ~230 lb   % Usual Body Weight: 95.5%  BMI:  Body mass index is 29.03 kg/(m^2). --Overweight  Estimated Nutritional Needs: Kcal: 1900-2100 Protein: 90-100  Fluid: 1.9-2.1 L/day  Skin: intact, no wounds noted  Diet Order: Clear Liquid  EDUCATION NEEDS: -No education needs identified at this time   Intake/Output Summary (Last 24 hours) at 02/21/12 0901 Last data filed at 02/20/12 2317  Gross per 24 hour  Intake    483 ml  Output      0 ml  Net    483 ml    Last BM: PTA  Labs:   Lab 02/21/12 0635 02/20/12 1024  NA 140 140  K 4.4 3.7  CL 107 105  CO2 27 24  BUN 18 29*  CREATININE 0.94 0.97  CALCIUM 9.2 10.1  MG 2.1 --  PHOS 3.1 --  GLUCOSE 99 123*    CBG (last 3)  No results found for this basename: GLUCAP:3 in the last 72 hours  Scheduled Meds:   . sodium chloride   Intravenous STAT  . loratadine  10 mg Oral Daily  . multivitamin with minerals  1 tablet Oral Daily  . pantoprazole (PROTONIX) IV  40 mg Intravenous Q12H  . predniSONE  30 mg Oral Q breakfast  . simvastatin  40 mg Oral QHS  . sodium chloride  3 mL Intravenous Q12H  . Tamsulosin HCl  0.4 mg Oral Daily    Continuous Infusions:   Past Medical History  Diagnosis Date  . CAD (coronary artery disease)     sees Dr. Juanito Doom  . GERD (gastroesophageal reflux disease)   . Barrett's esophageal ulceration   . BPH (benign prostatic hyperplasia)   . Colonic polyp   . Hyperlipidemia   . SOB (shortness of breath)   . Dysphagia   .  Pre-operative cardiovascular examination   . Contact with or exposure to venereal diseases   . Laceration of scalp   . Concussion with loss of consciousness of 30 minutes or less   . Nephrolithiasis   . Diverticulosis of colon   . Contact dermatitis   . COPD (chronic obstructive pulmonary disease)     sees Dr. Cyril Mourning   . Arthritis   . Hypertension   . Myocardial infarction     2011- stent placed    Past Surgical History  Procedure Date  . Tonsillectomy   . Left knee surgery   . Colonoscopy 03/01/09    per Dr. Arlyce Dice; had benign polyps, repeat 5 years   . Egd with dilatation 02/17/09    Barretts esophagus   . Heart stent   . Right knee surgery       Trenton Gammon Dietetic Intern # [phone]  Agree with note, additions and comments added in blue.  Clarene Duke RD, LDN Pager [phone] After Hours pager [phone]

## 2012-02-22 DIAGNOSIS — I10 Essential (primary) hypertension: Secondary | ICD-10-CM | POA: Diagnosis not present

## 2012-02-22 DIAGNOSIS — K2270 Barrett's esophagus without dysplasia: Secondary | ICD-10-CM | POA: Diagnosis not present

## 2012-02-22 DIAGNOSIS — I2510 Atherosclerotic heart disease of native coronary artery without angina pectoris: Secondary | ICD-10-CM | POA: Diagnosis not present

## 2012-02-22 DIAGNOSIS — K922 Gastrointestinal hemorrhage, unspecified: Secondary | ICD-10-CM | POA: Diagnosis not present

## 2012-02-22 LAB — CBC
HCT: 28.7 % — ABNORMAL LOW (ref 39.0–52.0)
Hemoglobin: 9.8 g/dL — ABNORMAL LOW (ref 13.0–17.0)
MCH: 29 pg (ref 26.0–34.0)
MCHC: 34.1 g/dL (ref 30.0–36.0)
MCV: 84.9 fL (ref 78.0–100.0)
Platelets: 254 10*3/uL (ref 150–400)
RBC: 3.38 MIL/uL — ABNORMAL LOW (ref 4.22–5.81)
RDW: 13.2 % (ref 11.5–15.5)
WBC: 12.6 10*3/uL — ABNORMAL HIGH (ref 4.0–10.5)

## 2012-02-22 NOTE — Progress Notes (Signed)
Utilization review completed.  P.J. Catherine Cubero,RN,BSN Case Manager [phone]  

## 2012-02-22 NOTE — Discharge Summary (Signed)
PATIENT DETAILS Name: Tyrone Roman Age: 67 y.o. Sex: male Date of Birth: Feb 18, 1945 MRN: [id number]. Admit Date: 02/20/2012 Admitting Physician: Maretta Bees, MD PCP:FRY,STEPHEN A, MD  Recommendations for Outpatient Follow-up:  Check CBC at next visit  PRIMARY DISCHARGE DIAGNOSIS:  Active Problems:  Lower GI bleeding-likely Diverticular  Acute Blood loss anemia  COLONIC POLYPS  GERD  BARRETTS ESOPHAGUS  DIVERTICULOSIS, COLON  COPD (chronic obstructive pulmonary disease)      PAST MEDICAL HISTORY: Past Medical History  Diagnosis Date  . CAD (coronary artery disease)     sees Dr. Juanito Doom  . GERD (gastroesophageal reflux disease)   . Barrett's esophageal ulceration   . BPH (benign prostatic hyperplasia)   . Colonic polyp   . Hyperlipidemia   . SOB (shortness of breath)   . Dysphagia   . Pre-operative cardiovascular examination   . Contact with or exposure to venereal diseases   . Laceration of scalp   . Concussion with loss of consciousness of 30 minutes or less   . Nephrolithiasis   . Diverticulosis of colon   . Contact dermatitis   . COPD (chronic obstructive pulmonary disease)     sees Dr. Cyril Mourning   . Arthritis   . Hypertension   . Myocardial infarction     2011- stent placed    DISCHARGE MEDICATIONS:   Medication List     As of 02/22/2012 10:21 AM    STOP taking these medications         prasugrel 10 MG Tabs   Commonly known as: EFFIENT      TAKE these medications         aspirin EC 81 MG tablet   Take 81 mg by mouth daily.      cetirizine 10 MG tablet   Commonly known as: ZYRTEC   Take 10 mg by mouth daily.      HYDROcodone-acetaminophen 5-325 MG per tablet   Commonly known as: NORCO/VICODIN   Take 1-2 tablets by mouth every 4 (four) hours as needed. For pain      metoprolol succinate 25 MG 24 hr tablet   Commonly known as: TOPROL-XL   Take 1 tablet (25 mg total) by mouth daily.      multivitamin with minerals Tabs   Take 1  tablet by mouth daily.      nitroGLYCERIN 0.4 MG SL tablet   Commonly known as: NITROSTAT   Place 1 tablet (0.4 mg total) under the tongue every 5 (five) minutes as needed.      omeprazole 40 MG capsule   Commonly known as: PRILOSEC   Take 1 capsule (40 mg total) by mouth daily.      predniSONE 10 MG tablet   Commonly known as: DELTASONE   Take 40 mg by mouth daily. For 5 days  Started 1/11      SB DOCUSATE SODIUM/SENNA 8.6-50 MG per tablet   Generic drug: senna-docusate   Take 1 tablet by mouth daily as needed. For constipation      simvastatin 40 MG tablet   Commonly known as: ZOCOR   Take 1 tablet (40 mg total) by mouth at bedtime.      Tamsulosin HCl 0.4 MG Caps   Commonly known as: FLOMAX   Take 1 capsule (0.4 mg total) by mouth daily.          BRIEF HPI:  See H&P, Labs, Consult and Test reports for all details in brief, patient is 67 year old  male with a PMHx of CAD, HTN-on both ASA and Effient-who presented with Lower GI bleeding  CONSULTATIONS:   GI Curbside-Dr Hochrein  PERTINENT RADIOLOGIC STUDIES: No results found.   PERTINENT LAB RESULTS: CBC:  Basename 02/22/12 0425 02/21/12 1827  WBC 12.6* 8.9  HGB 9.8* 9.9*  HCT 28.7* 28.3*  PLT 254 245   CMET CMP     Component Value Date/Time   NA 140 02/21/2012 0635   K 4.4 02/21/2012 0635   CL 107 02/21/2012 0635   CO2 27 02/21/2012 0635   GLUCOSE 99 02/21/2012 0635   BUN 18 02/21/2012 0635   CREATININE 0.94 02/21/2012 0635   CALCIUM 9.2 02/21/2012 0635   PROT 5.6* 02/21/2012 0635   ALBUMIN 2.9* 02/21/2012 0635   AST 14 02/21/2012 0635   ALT 11 02/21/2012 0635   ALKPHOS 40 02/21/2012 0635   BILITOT 0.5 02/21/2012 0635   GFRNONAA 85* 02/21/2012 0635   GFRAA >90 02/21/2012 0635    GFR Estimated Creatinine Clearance: 96.1 ml/min (by C-G formula based on Cr of 0.94). No results found for this basename: LIPASE:2,AMYLASE:2 in the last 72 hours  Basename 02/20/12 1119  CKTOTAL --  CKMB --  CKMBINDEX --    TROPONINI <0.30   No components found with this basename: POCBNP:3 No results found for this basename: DDIMER:2 in the last 72 hours No results found for this basename: HGBA1C:2 in the last 72 hours No results found for this basename: CHOL:2,HDL:2,LDLCALC:2,TRIG:2,CHOLHDL:2,LDLDIRECT:2 in the last 72 hours No results found for this basename: TSH,T4TOTAL,FREET3,T3FREE,THYROIDAB in the last 72 hours No results found for this basename: VITAMINB12:2,FOLATE:2,FERRITIN:2,TIBC:2,IRON:2,RETICCTPCT:2 in the last 72 hours Coags:  Basename 02/20/12 1024  INR 1.02   Microbiology: No results found for this or any previous visit (from the past 240 hour(s)).   BRIEF HOSPITAL COURSE:   Active Problems: Lower GI bleeding  -Likely Diverticular -bleeding now has stopped fore more than 24 hours -was on ASA and Effient prior to admission, last PCI was in April of 2011. Spoke with Dr Piedad Climes GI bleeding-ok to stop Effient, maintain on ASA. -seen by GI-stable for discharge -He had a colonoscopy in 02/2009 per Dr. Arlyce Dice showing moderate diverticulosis  Anemia  -from acute blood loss -Hb on admission was 11.0, at discharge 9.8. -Since bleeding stopped and Hb relatively stable-no PRBC transfusion was done  CAD  -anti-platelet agents on hold on admit, Spoke with Dr Piedad Climes GI bleeding-ok to stop Effient, maintain on ASA. Have asked patient to make a follow up appointment with Dr Daleen Squibb in the next week or so.  HTN  -controlled without use of anti-hypertensive  -resume on discharge  Dyslipidemia  -c/w Zocor   BPH  -c/w Flomax   Polymyalgia Rheumatica  -patient was on a tapering dose of prednisone-resume on discharge   TODAY-DAY OF DISCHARGE:  Subjective:   Tyrone Roman today has no headache,no chest abdominal pain,no new weakness tingling or numbness, feels much better wants to go home today. No further GI bleed for more than 24 hours  Objective:   Blood pressure 112/67,  pulse 83, temperature 97.9 F (36.6 C), temperature source Oral, resp. rate 16, height 6\' 1"  (1.854 m), weight 99.79 kg (220 lb), SpO2 98.00%.  Intake/Output Summary (Last 24 hours) at 02/22/12 1021 Last data filed at 02/22/12 1007  Gross per 24 hour  Intake 1526.25 ml  Output      0 ml  Net 1526.25 ml    Exam Awake Alert, Oriented *3, No new F.N deficits, Normal  affect [place].AT,PERRAL Supple Neck,No JVD, No cervical lymphadenopathy appriciated.  Symmetrical Chest wall movement, Good air movement bilaterally, CTAB RRR,No Gallops,Rubs or new Murmurs, No Parasternal Heave +ve B.Sounds, Abd Soft, Non tender, No organomegaly appriciated, No rebound -guarding or rigidity. No Cyanosis, Clubbing or edema, No new Rash or bruise  DISCHARGE CONDITION: Stable  DISPOSITION: HOME  DISCHARGE INSTRUCTIONS:    Activity:  As tolerated with Full fall precautions use walker/cane & assistance as needed  Diet recommendation: Heart Healthy diet   Follow-up Information    Follow up with FRY,STEPHEN A, MD. Schedule an appointment as soon as possible for a visit in 1 week.   Contact information:   [address] [place] [address] [phone]       Follow up with Valera Castle, MD. Schedule an appointment as soon as possible for a visit in 1 week.   Contact information:   [address] [place] [address] [place] [address] [phone]            Total Time spent on discharge equals 45 minutes.  SignedJeoffrey Massed 02/22/2012 10:21 AM

## 2012-02-25 NOTE — Progress Notes (Signed)
I have personally taken an interval history, reviewed the chart, and examined the patient.  I agree with the extender's note, impression and recommendations.  Jaryn Rosko D. Carlyn Mullenbach, MD, FACG [facility] [phone]  

## 2012-02-26 ENCOUNTER — Encounter: Payer: Self-pay | Admitting: Family Medicine

## 2012-02-26 ENCOUNTER — Ambulatory Visit (INDEPENDENT_AMBULATORY_CARE_PROVIDER_SITE_OTHER): Payer: Medicare Other | Admitting: Family Medicine

## 2012-02-26 VITALS — BP 122/78 | HR 108 | Temp 98.2°F | Wt 217.0 lb

## 2012-02-26 DIAGNOSIS — M353 Polymyalgia rheumatica: Secondary | ICD-10-CM

## 2012-02-26 DIAGNOSIS — K922 Gastrointestinal hemorrhage, unspecified: Secondary | ICD-10-CM | POA: Diagnosis not present

## 2012-02-26 LAB — CBC WITH DIFFERENTIAL/PLATELET
Basophils Absolute: 0 10*3/uL (ref 0.0–0.1)
Basophils Relative: 0 % (ref 0.0–3.0)
Eosinophils Absolute: 0.1 10*3/uL (ref 0.0–0.7)
Eosinophils Relative: 0.8 % (ref 0.0–5.0)
HCT: 30.7 % — ABNORMAL LOW (ref 39.0–52.0)
Hemoglobin: 10.3 g/dL — ABNORMAL LOW (ref 13.0–17.0)
Lymphocytes Relative: 9.5 % — ABNORMAL LOW (ref 12.0–46.0)
Lymphs Abs: 1.6 10*3/uL (ref 0.7–4.0)
MCHC: 33.4 g/dL (ref 30.0–36.0)
MCV: 86.3 fl (ref 78.0–100.0)
Monocytes Absolute: 0.9 10*3/uL (ref 0.1–1.0)
Monocytes Relative: 5.7 % (ref 3.0–12.0)
Neutro Abs: 13.9 10*3/uL — ABNORMAL HIGH (ref 1.4–7.7)
Neutrophils Relative %: 84 % — ABNORMAL HIGH (ref 43.0–77.0)
Platelets: 277 10*3/uL (ref 150.0–400.0)
RBC: 3.55 Mil/uL — ABNORMAL LOW (ref 4.22–5.81)
RDW: 14.6 % (ref 11.5–14.6)
WBC: 16.6 10*3/uL — ABNORMAL HIGH (ref 4.5–10.5)

## 2012-02-26 NOTE — Progress Notes (Signed)
Quick Note:  Pt was here in office today and Dr. Clent Ridges went over with him. ______

## 2012-02-26 NOTE — Progress Notes (Signed)
  Subjective:    Patient ID: Tyrone Roman, male    DOB: [birth year], 68 y.o.   MRN: [id number]  HPI Here to follow up a hospital stay from 02-20-12 to 02-22-12 for a lower GI bleed, likely diverticular. The bleeding stopped spontaneously within 24 hours and Tyrone Roman has had no bleeding ever since. Per Dr. Antoine Poche they stopped his Effient and Tyrone Roman is currently taking only 81 mg a day of aspirin. No abdominal pain, and his BMs are regular. His DC Hgb was 9.8. Tyrone Roman is now down to 20 mg a day of Prednisone, and his muscle aches are greatly improved.    Review of Systems  Constitutional: Negative.   Respiratory: Negative.   Cardiovascular: Negative.   Gastrointestinal: Negative.        Objective:   Physical Exam  Constitutional: Tyrone Roman appears well-developed and well-nourished.  Abdominal: Soft. Bowel sounds are normal. Tyrone Roman exhibits no distension and no mass. There is no tenderness. There is no rebound and no guarding.          Assessment & Plan:  His GI bleed seems to have resolved. Tyrone Roman will stay on a high fiber diet. We will check a CBC today. Tyrone Roman will see Dr. Daleen Squibb sometime soon. Continue on the Prednisone taper.

## 2012-02-26 NOTE — Care Management Note (Signed)
    Page 1 of 1   02/26/2012     3:30:23 PM   CARE MANAGEMENT NOTE 02/26/2012  Patient:  Tyrone Roman, Tyrone Roman   Account Number:  [id number]  Date Initiated:  02/26/2012  Documentation initiated by:  Letha Cape  Subjective/Objective Assessment:   admit     Action/Plan:   Anticipated DC Date:  02/22/2012   Anticipated DC Plan:  HOME/SELF CARE      DC Planning Services  CM consult      Choice offered to / List presented to:             Status of service:  Completed, signed off Medicare Important Message given?   (If response is "NO", the following Medicare IM given date fields will be blank) Date Medicare IM given:   Date Additional Medicare IM given:    Discharge Disposition:  HOME/SELF CARE  Per UR Regulation:  Reviewed for med. necessity/level of care/duration of stay  If discussed at Long Length of Stay Meetings, dates discussed:    Comments:  02/26/12 15:28 Letha Cape RN, BSN [phone] dc to home.

## 2012-03-11 ENCOUNTER — Encounter: Payer: Self-pay | Admitting: Family Medicine

## 2012-03-11 ENCOUNTER — Telehealth: Payer: Self-pay | Admitting: Family Medicine

## 2012-03-11 NOTE — Telephone Encounter (Signed)
I spoke with pt and gave results.  

## 2012-03-11 NOTE — Telephone Encounter (Signed)
Pt would like results of labs done last week. °

## 2012-05-12 ENCOUNTER — Ambulatory Visit (INDEPENDENT_AMBULATORY_CARE_PROVIDER_SITE_OTHER): Payer: Medicare Other | Admitting: Family Medicine

## 2012-05-12 ENCOUNTER — Encounter: Payer: Self-pay | Admitting: Family Medicine

## 2012-05-12 ENCOUNTER — Telehealth: Payer: Self-pay | Admitting: Family Medicine

## 2012-05-12 VITALS — BP 130/80 | HR 115 | Temp 98.1°F | Wt 230.0 lb

## 2012-05-12 DIAGNOSIS — IMO0001 Reserved for inherently not codable concepts without codable children: Secondary | ICD-10-CM | POA: Diagnosis not present

## 2012-05-12 DIAGNOSIS — G5600 Carpal tunnel syndrome, unspecified upper limb: Secondary | ICD-10-CM | POA: Diagnosis not present

## 2012-05-12 MED ORDER — HYDROCODONE-ACETAMINOPHEN 5-325 MG PO TABS: 1.0000 | ORAL_TABLET | ORAL | Status: DC | PRN

## 2012-05-12 MED ORDER — PREDNISONE 10 MG PO TABS: 20.0000 mg | ORAL_TABLET | Freq: Every day | ORAL | Status: DC

## 2012-05-12 NOTE — Telephone Encounter (Signed)
I spoke with pt and he does need to keep appointment.

## 2012-05-12 NOTE — Telephone Encounter (Signed)
Pt states he has hx of myalgia, woke up with a swollen hand and is requesting a rx for prednisone.  Advised pt he may need to be seen in office before pcp can prescribe medication.  Appt made for 11:30 today however pt states if med can be called in he will cancel appt. Pharmacy: [facility]

## 2012-05-12 NOTE — Progress Notes (Signed)
  Subjective:    Patient ID: Tyrone Roman, male    DOB: 04-03-1945, 67 y.o.   MRN: [id number]  HPI Here for the sudden onset when he woke up this am of swelling and pain in both hands and forearms. No SOB or chest pain. No neck or back pain. His hands are swollen and tight, and it hurts to use them at all. He had been doing a lot of yardwork this past weekend. He has had mild carpal tunnel syndrome before. Of note we suspected he may have PMR and in January we drew some labs which showed positive results on ESR, ANA, and rheumatoid factor.    Review of Systems  Constitutional: Negative.   Respiratory: Negative.   Cardiovascular: Negative.   Musculoskeletal: Positive for myalgias and joint swelling.       Objective:   Physical Exam  Constitutional: He appears well-developed and well-nourished.  Cardiovascular: Normal rate, regular rhythm, normal heart sounds and intact distal pulses.   Pulmonary/Chest: Effort normal and breath sounds normal.  Musculoskeletal:  Both hands and wrists are mildly swollen. He is quite tender over both hands and wrists and forearms. No erythema or warmth.           Assessment & Plan:  This is consistent with carpal tunnel syndrome but he has had a dramatic flare from fairly minimal usage. This may reflect his underlying inflammatory state. Given Vicodin for pain and start on 20 mg of prednisone daily. Use ice and wrist braces. Rest and elevate the arms. We will refer to Rheumatology to evaluate.

## 2012-06-06 DIAGNOSIS — M2550 Pain in unspecified joint: Secondary | ICD-10-CM | POA: Diagnosis not present

## 2012-06-20 DIAGNOSIS — M069 Rheumatoid arthritis, unspecified: Secondary | ICD-10-CM | POA: Diagnosis not present

## 2012-07-21 ENCOUNTER — Other Ambulatory Visit: Payer: Self-pay | Admitting: Family Medicine

## 2012-08-04 DIAGNOSIS — M069 Rheumatoid arthritis, unspecified: Secondary | ICD-10-CM | POA: Diagnosis not present

## 2012-09-10 ENCOUNTER — Other Ambulatory Visit: Payer: Self-pay

## 2012-09-15 DIAGNOSIS — M25539 Pain in unspecified wrist: Secondary | ICD-10-CM | POA: Diagnosis not present

## 2012-09-15 DIAGNOSIS — G5600 Carpal tunnel syndrome, unspecified upper limb: Secondary | ICD-10-CM | POA: Diagnosis not present

## 2012-09-15 DIAGNOSIS — M069 Rheumatoid arthritis, unspecified: Secondary | ICD-10-CM | POA: Diagnosis not present

## 2012-09-15 DIAGNOSIS — M79609 Pain in unspecified limb: Secondary | ICD-10-CM | POA: Diagnosis not present

## 2012-10-07 ENCOUNTER — Telehealth: Payer: Self-pay | Admitting: Family Medicine

## 2012-10-07 NOTE — Telephone Encounter (Signed)
Refill request for Ambien 10 mg take 1 po qhs prn and a 90 day supply.

## 2012-10-08 MED ORDER — ZOLPIDEM TARTRATE 10 MG PO TABS: 10.0000 mg | ORAL_TABLET | Freq: Every evening | ORAL | Status: DC | PRN

## 2012-10-08 NOTE — Telephone Encounter (Signed)
I called in script 

## 2012-10-08 NOTE — Telephone Encounter (Signed)
Okay for 6 months 

## 2012-11-17 DIAGNOSIS — Z23 Encounter for immunization: Secondary | ICD-10-CM | POA: Diagnosis not present

## 2012-11-17 DIAGNOSIS — M069 Rheumatoid arthritis, unspecified: Secondary | ICD-10-CM | POA: Diagnosis not present

## 2012-11-21 ENCOUNTER — Other Ambulatory Visit: Payer: Self-pay | Admitting: Family Medicine

## 2012-11-24 ENCOUNTER — Telehealth: Payer: Self-pay | Admitting: Family Medicine

## 2012-11-24 NOTE — Telephone Encounter (Signed)
Refill request for Zocor 40 mg & Toprol XL 25 mg and send to [facility].

## 2012-11-25 NOTE — Telephone Encounter (Signed)
Can we refill the 2 scripts below?

## 2012-11-26 MED ORDER — SIMVASTATIN 40 MG PO TABS: 40.0000 mg | ORAL_TABLET | Freq: Every day | ORAL | Status: DC

## 2012-11-26 MED ORDER — METOPROLOL SUCCINATE ER 25 MG PO TB24: 25.0000 mg | ORAL_TABLET | Freq: Every day | ORAL | Status: DC

## 2012-11-26 NOTE — Telephone Encounter (Signed)
I sent both scripts e-scribe. 

## 2012-11-26 NOTE — Telephone Encounter (Signed)
Refill for one year 

## 2012-12-09 DIAGNOSIS — M069 Rheumatoid arthritis, unspecified: Secondary | ICD-10-CM | POA: Diagnosis not present

## 2012-12-09 DIAGNOSIS — M25539 Pain in unspecified wrist: Secondary | ICD-10-CM | POA: Diagnosis not present

## 2012-12-11 ENCOUNTER — Other Ambulatory Visit: Payer: Self-pay

## 2013-01-26 ENCOUNTER — Telehealth: Payer: Self-pay | Admitting: Family Medicine

## 2013-01-26 NOTE — Telephone Encounter (Signed)
Pt has flu shot at dr Dareen Piano office

## 2013-01-26 NOTE — Telephone Encounter (Signed)
I updated chart with today's date.

## 2013-02-02 ENCOUNTER — Other Ambulatory Visit: Payer: Self-pay | Admitting: Family Medicine

## 2013-02-02 NOTE — Telephone Encounter (Signed)
This should be taken prn, call in #10 with 11 rf

## 2013-02-13 ENCOUNTER — Other Ambulatory Visit: Payer: Self-pay | Admitting: Family Medicine

## 2013-03-23 DIAGNOSIS — M069 Rheumatoid arthritis, unspecified: Secondary | ICD-10-CM | POA: Diagnosis not present

## 2013-03-25 ENCOUNTER — Ambulatory Visit (INDEPENDENT_AMBULATORY_CARE_PROVIDER_SITE_OTHER): Payer: Medicare Other | Admitting: Family Medicine

## 2013-03-25 ENCOUNTER — Encounter: Payer: Self-pay | Admitting: Family Medicine

## 2013-03-25 VITALS — BP 142/90 | HR 98 | Ht 73.0 in | Wt 246.0 lb

## 2013-03-25 DIAGNOSIS — M545 Low back pain, unspecified: Secondary | ICD-10-CM

## 2013-03-25 DIAGNOSIS — M069 Rheumatoid arthritis, unspecified: Secondary | ICD-10-CM | POA: Insufficient documentation

## 2013-03-25 MED ORDER — OXYCODONE-ACETAMINOPHEN 10-325 MG PO TABS: 1.0000 | ORAL_TABLET | Freq: Four times a day (QID) | ORAL | Status: DC | PRN

## 2013-03-25 MED ORDER — FOLIC ACID 1 MG PO TABS: 1.0000 mg | ORAL_TABLET | Freq: Every day | ORAL | Status: DC

## 2013-03-25 MED ORDER — METHYLPREDNISOLONE ACETATE 80 MG/ML IJ SUSP
160.0000 mg | Freq: Once | INTRAMUSCULAR | Status: AC
  Administered 2013-03-25: 160 mg via INTRAMUSCULAR

## 2013-03-25 MED ORDER — CYCLOBENZAPRINE HCL 10 MG PO TABS: 10.0000 mg | ORAL_TABLET | Freq: Three times a day (TID) | ORAL | Status: DC | PRN

## 2013-03-25 MED ORDER — METHOTREXATE (PF) 25 MG/0.5ML ~~LOC~~ SOAJ: 25.0000 mg | SUBCUTANEOUS | Status: DC

## 2013-03-25 MED ORDER — SILDENAFIL CITRATE 100 MG PO TABS: 50.0000 mg | ORAL_TABLET | Freq: Every day | ORAL | Status: DC | PRN

## 2013-03-25 NOTE — Progress Notes (Signed)
Pre visit review using our clinic review tool, if applicable. No additional management support is needed unless otherwise documented below in the visit note. 

## 2013-03-25 NOTE — Addendum Note (Signed)
Addended by: Aggie Hacker A on: 03/25/2013 02:01 PM   Modules accepted: Orders

## 2013-03-25 NOTE — Progress Notes (Signed)
   Subjective:    Patient ID: Tyrone Roman, male    DOB: 1945-05-21, 68 y.o.   MRN: [id number]  HPI Here for 2 days of sharp pains and spasms in the left lower back. He had just returned from a flight from [place] when this started. No pain in the legs. Using heat.    Review of Systems  Constitutional: Negative.   Musculoskeletal: Positive for back pain.       Objective:   Physical Exam  Constitutional:  In pain  Musculoskeletal:  Tender in the left lower back and over the lumbar spine, lots of spasm           Assessment & Plan:  Given a steroid shot, use Flexeril and Percocet prn.

## 2013-04-12 ENCOUNTER — Other Ambulatory Visit: Payer: Self-pay | Admitting: Family Medicine

## 2013-04-13 MED ORDER — ZOLPIDEM TARTRATE 10 MG PO TABS: 10.0000 mg | ORAL_TABLET | Freq: Every evening | ORAL | Status: DC | PRN

## 2013-04-13 NOTE — Telephone Encounter (Signed)
Call in #30 with 5 rf 

## 2013-04-13 NOTE — Telephone Encounter (Signed)
I called in script 

## 2013-04-20 ENCOUNTER — Telehealth: Payer: Self-pay | Admitting: Family Medicine

## 2013-04-20 NOTE — Telephone Encounter (Signed)
Pt is trying to see if dr. Clarene Duke can rewrite his rx for sildenafil (VIAGRA) 100 MG tablet, for insurance reasons he needs the generic which is 20 mg. [facility] [address].

## 2013-04-20 NOTE — Telephone Encounter (Signed)
Change Viagra to Sildenafil 20 mg to take 5 tabs prn, #50 with 11 rf

## 2013-04-21 MED ORDER — SILDENAFIL CITRATE 20 MG PO TABS: 100.0000 mg | ORAL_TABLET | ORAL | Status: DC | PRN

## 2013-04-21 NOTE — Telephone Encounter (Signed)
I sent script e-scribe. 

## 2013-06-22 ENCOUNTER — Telehealth: Payer: Self-pay | Admitting: Family Medicine

## 2013-06-22 NOTE — Telephone Encounter (Signed)
[facility] denied PA for Sildenafil Citrate.  Medication is not supported by the FDA for BPH.  I gave you a copy of the denial for review.

## 2013-06-23 NOTE — Telephone Encounter (Signed)
I spoke with pt  

## 2013-06-23 NOTE — Telephone Encounter (Signed)
I understand. I guess he will have to pay cash for this

## 2013-07-20 DIAGNOSIS — M069 Rheumatoid arthritis, unspecified: Secondary | ICD-10-CM | POA: Diagnosis not present

## 2013-07-28 DIAGNOSIS — M069 Rheumatoid arthritis, unspecified: Secondary | ICD-10-CM | POA: Diagnosis not present

## 2013-08-19 ENCOUNTER — Other Ambulatory Visit: Payer: Self-pay | Admitting: Family Medicine

## 2013-08-24 DIAGNOSIS — M069 Rheumatoid arthritis, unspecified: Secondary | ICD-10-CM | POA: Diagnosis not present

## 2013-10-14 ENCOUNTER — Other Ambulatory Visit: Payer: Self-pay | Admitting: Family Medicine

## 2013-10-14 NOTE — Telephone Encounter (Signed)
Call in #30 with 5 rf 

## 2013-10-19 DIAGNOSIS — M069 Rheumatoid arthritis, unspecified: Secondary | ICD-10-CM | POA: Diagnosis not present

## 2013-10-21 DIAGNOSIS — M069 Rheumatoid arthritis, unspecified: Secondary | ICD-10-CM | POA: Diagnosis not present

## 2013-11-14 ENCOUNTER — Other Ambulatory Visit: Payer: Self-pay | Admitting: Family Medicine

## 2013-12-14 DIAGNOSIS — M0589 Other rheumatoid arthritis with rheumatoid factor of multiple sites: Secondary | ICD-10-CM | POA: Diagnosis not present

## 2013-12-22 ENCOUNTER — Other Ambulatory Visit: Payer: Self-pay | Admitting: Family Medicine

## 2014-01-14 ENCOUNTER — Other Ambulatory Visit: Payer: Self-pay | Admitting: Family Medicine

## 2014-02-08 DIAGNOSIS — M0589 Other rheumatoid arthritis with rheumatoid factor of multiple sites: Secondary | ICD-10-CM | POA: Diagnosis not present

## 2014-02-13 ENCOUNTER — Other Ambulatory Visit: Payer: Self-pay | Admitting: Family Medicine

## 2014-02-22 DIAGNOSIS — M0589 Other rheumatoid arthritis with rheumatoid factor of multiple sites: Secondary | ICD-10-CM | POA: Diagnosis not present

## 2014-02-22 DIAGNOSIS — M25532 Pain in left wrist: Secondary | ICD-10-CM | POA: Diagnosis not present

## 2014-03-19 ENCOUNTER — Encounter: Payer: Self-pay | Admitting: Gastroenterology

## 2014-03-24 ENCOUNTER — Other Ambulatory Visit: Payer: Self-pay | Admitting: Family Medicine

## 2014-03-24 NOTE — Telephone Encounter (Signed)
Is pt due for a lipid panel?

## 2014-04-05 DIAGNOSIS — M0589 Other rheumatoid arthritis with rheumatoid factor of multiple sites: Secondary | ICD-10-CM | POA: Diagnosis not present

## 2014-04-28 ENCOUNTER — Encounter: Payer: Self-pay | Admitting: Family Medicine

## 2014-04-28 ENCOUNTER — Ambulatory Visit (INDEPENDENT_AMBULATORY_CARE_PROVIDER_SITE_OTHER): Payer: Medicare Other | Admitting: Family Medicine

## 2014-04-28 VITALS — BP 122/78 | HR 90 | Temp 98.9°F | Wt 226.9 lb

## 2014-04-28 DIAGNOSIS — I2510 Atherosclerotic heart disease of native coronary artery without angina pectoris: Secondary | ICD-10-CM

## 2014-04-28 DIAGNOSIS — J209 Acute bronchitis, unspecified: Secondary | ICD-10-CM

## 2014-04-28 MED ORDER — AMOXICILLIN-POT CLAVULANATE 875-125 MG PO TABS: 1.0000 | ORAL_TABLET | Freq: Two times a day (BID) | ORAL | Status: DC

## 2014-04-28 NOTE — Progress Notes (Signed)
   Subjective:    Patient ID: MOURAD CWIKLA, male    DOB: 08-Feb-1945, 69 y.o.   MRN: [id number]  HPI Here for one week of PND, chest tightness and coughing up yellow sputum. He has had low grade fevers. No chest pain. He also notes that he has not seen Cardiology for 2 and 1/2 years.    Review of Systems  Constitutional: Positive for fever.  HENT: Positive for congestion and postnasal drip. Negative for sinus pressure.   Eyes: Negative.   Respiratory: Positive for cough, chest tightness and wheezing. Negative for shortness of breath.   Cardiovascular: Negative.        Objective:   Physical Exam  Constitutional: He appears well-developed and well-nourished.  HENT:  Right Ear: External ear normal.  Left Ear: External ear normal.  Nose: Nose normal.  Mouth/Throat: Oropharynx is clear and moist.  Eyes: Conjunctivae are normal.  Cardiovascular: Normal rate, regular rhythm, normal heart sounds and intact distal pulses.   Pulmonary/Chest: Effort normal. No respiratory distress. He has no wheezes. He has no rales.  Scattered rhonchi   Lymphadenopathy:    He has no cervical adenopathy.          Assessment & Plan:  Treat with Augmentin and Mucinex. We will get him in to see cardiology soon.

## 2014-04-28 NOTE — Progress Notes (Signed)
Pre visit review using our clinic review tool, if applicable. No additional management support is needed unless otherwise documented below in the visit note. 

## 2014-05-03 ENCOUNTER — Other Ambulatory Visit: Payer: Self-pay | Admitting: Family Medicine

## 2014-05-04 NOTE — Telephone Encounter (Signed)
Pt last here in office on 04/28/14.

## 2014-05-05 NOTE — Telephone Encounter (Signed)
pcp NA Can refill 30 days   Further refill via dr Sarajane Jews

## 2014-05-12 NOTE — Progress Notes (Signed)
   Subjective:    Patient ID: Tyrone Roman, male    DOB: Feb 03, 1946, 69 y.o.   MRN: [id number]  HPI    Review of Systems     Objective:   Physical Exam        Assessment & Plan:  He is treated for acute bronchitis.

## 2014-05-15 ENCOUNTER — Other Ambulatory Visit: Payer: Self-pay | Admitting: Family Medicine

## 2014-05-31 DIAGNOSIS — M0589 Other rheumatoid arthritis with rheumatoid factor of multiple sites: Secondary | ICD-10-CM | POA: Diagnosis not present

## 2014-05-31 NOTE — Progress Notes (Signed)
Patient ID: Tyrone Roman, male   DOB: [birth year], 69 y.o.   MRN: [id number]     Cardiology Office Note   Date:  06/01/2014   ID:  Tyrone Roman, DOB Feb 09, 1945, MRN [id number]  PCP:  [name], MD  Cardiologist:   Jenkins Rouge, MD   No chief complaint on file.     History of Present Illness: Tyrone Roman is a 69 y.o. male who presents for evaluation of CAD.  Distantly seen by Dr Verl Blalock over 3 years ago His note from 2013 indicates CAD/PCI But no details Review of his Epic Chart indicates 05/2009 had stenting of the proximal LAD by Dr Lia Foyer with left dominant system and no other disease Relook cath by Dr Burt Knack 06/2009 no restenosis  Done for dyspnea thought to be anginal equivalent  Seen by pulmonary for COPD/Asthma and asbestosis Exposure with pleural plaquing by CTA 2012 no PE  Has had some progressive exertional dyspnea last 3-4 months.  Discussed ETT vs cath.  He initially preferred cath saying he did not think stress test  Was accurate but after discussing risks including stroke he was ok starting with ETT  No chest pain or dypsnea at rest     Past Medical History  Diagnosis Date  . CAD (coronary artery disease)     sees Dr. Mar Daring  . GERD (gastroesophageal reflux disease)   . Barrett's esophageal ulceration   . BPH (benign prostatic hyperplasia)   . Colonic polyp   . Hyperlipidemia   . SOB (shortness of breath)   . Dysphagia   . Pre-operative cardiovascular examination   . Contact with or exposure to venereal diseases   . Laceration of scalp   . Concussion with loss of consciousness of 30 minutes or less   . Nephrolithiasis   . Diverticulosis of colon   . Contact dermatitis   . COPD (chronic obstructive pulmonary disease)     sees Dr. Kara Mead   . Hypertension   . Myocardial infarction     2011- stent placed  . Rheumatoid arthritis     sees Dr. Tobie Lords     Past Surgical History  Procedure Laterality Date  . Tonsillectomy    . Left knee  surgery    . Colonoscopy  03/01/09    per Dr. Deatra Ina; had benign polyps, repeat 5 years   . Egd with dilatation  02/17/09    Barretts esophagus   . Heart stent    . Right knee surgery       Current Outpatient Prescriptions  Medication Sig Dispense Refill  . aspirin EC 81 MG tablet Take 81 mg by mouth daily.    . cetirizine (ZYRTEC) 10 MG tablet Take 10 mg by mouth daily.    . folic acid (FOLVITE) 1 MG tablet Take 1 tablet (1 mg total) by mouth daily. 1 tablet 0  . methotrexate (RHEUMATREX) 2.5 MG tablet Take 2.5 mg by mouth once a week.     . metoprolol succinate (TOPROL-XL) 25 MG 24 hr tablet TAKE ONE TABLET BY MOUTH ONE TIME DAILY 90 tablet 0  . Multiple Vitamin (MULTIVITAMIN WITH MINERALS) TABS Take 1 tablet by mouth daily.    . nitroGLYCERIN (NITROSTAT) 0.4 MG SL tablet Place 1 tablet (0.4 mg total) under the tongue every 5 (five) minutes as needed. 50 tablet 3  . omeprazole (PRILOSEC) 40 MG capsule TAKE ONE CAPSULE BY MOUTH ONE TIME DAILY 90 capsule 0  . senna-docusate (SB DOCUSATE SODIUM/SENNA)  8.6-50 MG per tablet Take 1 tablet by mouth daily as needed. For constipation    . simvastatin (ZOCOR) 40 MG tablet TAKE ONE TABLET BY MOUTH AT BEDTIME  90 tablet 3  . tamsulosin (FLOMAX) 0.4 MG CAPS capsule TAKE ONE CAPSULE BY MOUTH ONE TIME DAILY 90 capsule 0  . triamcinolone cream (KENALOG) 0.5 % Apply topically three times a day 30 g 0  . zolpidem (AMBIEN) 10 MG tablet TAKE ONE TABLET BY MOUTH AT BEDTIME  30 tablet 0  . sildenafil (REVATIO) 20 MG tablet Take 5 tablets (100 mg total) by mouth as needed. (Patient not taking: Reported on 06/01/2014) 50 tablet 11   No current facility-administered medications for this visit.    Allergies:   Cephalexin; Clarithromycin; and Lidoderm    Social History:  The patient  reports that he quit smoking about 25 years ago. His smoking use included Cigarettes. He has a 20 pack-year smoking history. He has never used smokeless tobacco. He reports that he  drinks alcohol. He reports that he does not use illicit drugs.   Family History:  The patient's family history includes Arthritis in his father and mother; Cancer in his maternal grandmother; Colon cancer in his father; Diabetes in his paternal uncle; Heart attack in his father; Prostate cancer in his father; Skin cancer in his daughter. There is no history of Esophageal cancer, Stomach cancer, or Rectal cancer.    ROS:  Please see the history of present illness.   Otherwise, review of systems are positive for none.   All other systems are reviewed and negative.    PHYSICAL EXAM: VS:  BP 104/76 mmHg  Pulse 73  Ht 6\' 1"  (1.854 m)  Wt 222 lb 12.8 oz (101.061 kg)  BMI 29.40 kg/m2 , BMI Body mass index is 29.4 kg/(m^2). Affect appropriate Healthy:  appears stated age 78: normal Neck supple with no adenopathy JVP normal no bruits no thyromegaly Lungs clear with no wheezing and good diaphragmatic motion Heart:  S1/S2 no murmur, no rub, gallop or click PMI normal Abdomen: benighn, BS positve, no tenderness, no AAA no bruit.  No HSM or HJR Distal pulses intact with no bruits No edema Neuro non-focal Skin warm and dry No muscular weakness    EKG:  02/23/12  SR normal ECG  06/01/14 SR rate 73 normal    Recent Labs: No results found for requested labs within last 365 days.    Lipid Panel    Component Value Date/Time   CHOL 121 11/15/2011 0911   TRIG 69.0 11/15/2011 0911   HDL 36.90* 11/15/2011 0911   CHOLHDL 3 11/15/2011 0911   VLDL 13.8 11/15/2011 0911   LDLCALC 70 11/15/2011 0911      Wt Readings from Last 3 Encounters:  06/01/14 222 lb 12.8 oz (101.061 kg)  04/28/14 226 lb 14.4 oz (102.921 kg)  03/25/13 246 lb (111.585 kg)      Other studies Reviewed: Additional studies/ records that were reviewed today include: Cath films 2011 see HPI and Epic notes.    ASSESSMENT AND PLAN:  1.  CAD:  Normal ECG  Previous anginal equivalent dyspnea Recurrent f/u ETT  2.  Chol:  On statin  Will order labs today as they have not been checked in a while Lab Results  Component Value Date   LDLCALC 70 11/15/2011  3. Arthritis  Ouida Sills retired  F/U [place]  On rheumatrex and methotrexate             Current  medicines are reviewed at length with the patient today.  The patient does not have concerns regarding medicines.  The following changes have been made:  no change  Labs/ tests ordered today include:  ETT  No orders of the defined types were placed in this encounter.     Disposition:   FU with me 6 months if ETT normal      Signed, Jenkins Rouge, MD  06/01/2014 11:38 AM    [facility] [address], [place], [place]  [address] Phone: [phone]; Fax: [phone]

## 2014-06-01 ENCOUNTER — Ambulatory Visit (INDEPENDENT_AMBULATORY_CARE_PROVIDER_SITE_OTHER): Payer: Medicare Other | Admitting: Cardiovascular Disease

## 2014-06-01 ENCOUNTER — Encounter: Payer: Self-pay | Admitting: Cardiovascular Disease

## 2014-06-01 VITALS — BP 104/76 | HR 73 | Ht 73.0 in | Wt 222.8 lb

## 2014-06-01 DIAGNOSIS — R55 Syncope and collapse: Secondary | ICD-10-CM | POA: Diagnosis not present

## 2014-06-01 DIAGNOSIS — E785 Hyperlipidemia, unspecified: Secondary | ICD-10-CM | POA: Diagnosis not present

## 2014-06-01 DIAGNOSIS — I2510 Atherosclerotic heart disease of native coronary artery without angina pectoris: Secondary | ICD-10-CM

## 2014-06-01 DIAGNOSIS — Z79899 Other long term (current) drug therapy: Secondary | ICD-10-CM | POA: Diagnosis not present

## 2014-06-01 DIAGNOSIS — R0602 Shortness of breath: Secondary | ICD-10-CM

## 2014-06-01 DIAGNOSIS — R0600 Dyspnea, unspecified: Secondary | ICD-10-CM

## 2014-06-01 DIAGNOSIS — R0609 Other forms of dyspnea: Secondary | ICD-10-CM | POA: Diagnosis not present

## 2014-06-01 LAB — LIPID PANEL
Cholesterol: 117 mg/dL (ref 0–200)
HDL: 41.9 mg/dL (ref >39.00)
LDL Cholesterol: 61 mg/dL (ref 0–99)
NonHDL: 75.1
Total CHOL/HDL Ratio: 3
Triglycerides: 69 mg/dL (ref 0.0–149.0)
VLDL: 13.8 mg/dL (ref 0.0–40.0)

## 2014-06-01 LAB — HEPATIC FUNCTION PANEL
ALT: 16 U/L (ref 0–53)
AST: 25 U/L (ref 0–37)
Albumin: 4.2 g/dL (ref 3.5–5.2)
Alkaline Phosphatase: 63 U/L (ref 39–117)
Bilirubin, Direct: 0.1 mg/dL (ref 0.0–0.3)
Total Bilirubin: 0.6 mg/dL (ref 0.2–1.2)
Total Protein: 7.1 g/dL (ref 6.0–8.3)

## 2014-06-01 NOTE — Patient Instructions (Addendum)
Medication Instructions:  NON CHANGES  Labwork: LIPID AND LIVER  TODAY   Testing/Procedures:Your physician has requested that you have an exercise tolerance test. For further information please visit [email or web address]. Please also follow instruction sheet, as given.   Follow-Up: 6 MONTHS  WITH DR  Johnsie Cancel  IF GXT  ABNORMAL  WILL SEE  EARLIER  Any Other Special Instructions Will Be Listed Below (If Applicable).

## 2014-06-03 ENCOUNTER — Telehealth: Payer: Self-pay | Admitting: Cardiovascular Disease

## 2014-06-03 NOTE — Telephone Encounter (Signed)
PT ASKING  IF HE NEEDS  GXT   WAS  INFORMED TO  KEEP  APPT   TO MAKE  SURE  HEART IS  GETTING GOOD  BLOOD  FLOW  AND  NO EKG  CHANGES  WITH EXERCISE .PT  VERBALIZED UNDERSTANDING /CY

## 2014-06-03 NOTE — Telephone Encounter (Signed)
New problem ° ° °Pt returning your call. °

## 2014-06-16 ENCOUNTER — Ambulatory Visit (INDEPENDENT_AMBULATORY_CARE_PROVIDER_SITE_OTHER): Payer: Medicare Other | Admitting: Family Medicine

## 2014-06-16 ENCOUNTER — Encounter: Payer: Self-pay | Admitting: Family Medicine

## 2014-06-16 VITALS — BP 106/66 | HR 84 | Temp 97.8°F | Ht 73.0 in | Wt 223.0 lb

## 2014-06-16 DIAGNOSIS — W57XXXA Bitten or stung by nonvenomous insect and other nonvenomous arthropods, initial encounter: Secondary | ICD-10-CM | POA: Diagnosis not present

## 2014-06-16 DIAGNOSIS — S80869A Insect bite (nonvenomous), unspecified lower leg, initial encounter: Secondary | ICD-10-CM | POA: Diagnosis not present

## 2014-06-16 DIAGNOSIS — I2510 Atherosclerotic heart disease of native coronary artery without angina pectoris: Secondary | ICD-10-CM

## 2014-06-16 MED ORDER — TRIAMCINOLONE ACETONIDE 0.5 % EX CREA: TOPICAL_CREAM | CUTANEOUS | Status: DC

## 2014-06-16 MED ORDER — ZOLPIDEM TARTRATE 10 MG PO TABS: 10.0000 mg | ORAL_TABLET | Freq: Every day | ORAL | Status: DC

## 2014-06-16 NOTE — Progress Notes (Signed)
   Subjective:    Patient ID: Tyrone Roman, male    DOB: 16-Mar-1945, 69 y.o.   MRN: [id number]  HPI Here for one week of red itchy lesions on both legs. Every day new lesions seem to appear while the older ones fade away. He has been working in his yard but he says he always wears long pants.    Review of Systems  Constitutional: Negative.   Skin: Positive for rash.       Objective:   Physical Exam  Constitutional: He appears well-developed and well-nourished. No distress.  Skin:  There 6 or 7 scattered red nodular lesions on the legs          Assessment & Plan:  These appear to be chigger bites. Take an oral antihistamine like Benadryl. Use Triamcinolone cream prn.

## 2014-06-16 NOTE — Progress Notes (Signed)
Pre visit review using our clinic review tool, if applicable. No additional management support is needed unless otherwise documented below in the visit note. 

## 2014-07-06 ENCOUNTER — Other Ambulatory Visit: Payer: Self-pay | Admitting: Cardiovascular Disease

## 2014-07-06 ENCOUNTER — Ambulatory Visit (HOSPITAL_COMMUNITY): Payer: Medicare Other | Attending: Cardiology

## 2014-07-06 ENCOUNTER — Encounter: Payer: Medicare Other | Admitting: Physician Assistant

## 2014-07-06 ENCOUNTER — Ambulatory Visit: Payer: Medicare Other | Admitting: Physician Assistant

## 2014-07-06 DIAGNOSIS — R0602 Shortness of breath: Secondary | ICD-10-CM

## 2014-07-06 DIAGNOSIS — R0609 Other forms of dyspnea: Principal | ICD-10-CM

## 2014-07-06 DIAGNOSIS — R0600 Dyspnea, unspecified: Secondary | ICD-10-CM

## 2014-07-06 DIAGNOSIS — R55 Syncope and collapse: Secondary | ICD-10-CM

## 2014-07-06 LAB — EXERCISE TOLERANCE TEST
Estimated workload: 1 METS
Exercise duration (min): 9 min
Exercise duration (sec): 0 s
MPHR: 151 {beats}/min
Peak HR: 151 {beats}/min
Percent HR: 100 %
Percent of predicted max HR: 100 %
Rest HR: 80 {beats}/min
Stage 1 DBP: 85 mmHg
Stage 1 Grade: 0 %
Stage 1 HR: 83 {beats}/min
Stage 1 SBP: 128 mmHg
Stage 1 Speed: 0 mph
Stage 2 DBP: 82 mmHg
Stage 2 Grade: 0 %
Stage 2 HR: 85 {beats}/min
Stage 2 SBP: 126 mmHg
Stage 2 Speed: 0 mph
Stage 3 Grade: 0 %
Stage 3 HR: 85 {beats}/min
Stage 3 Speed: 0 mph
Stage 4 Grade: 10 %
Stage 4 HR: 107 {beats}/min
Stage 4 Speed: 1.7 mph
Stage 5 DBP: 64 mmHg
Stage 5 Grade: 12 %
Stage 5 HR: 130 {beats}/min
Stage 5 SBP: 146 mmHg
Stage 5 Speed: 2.5 mph
Stage 6 DBP: 83 mmHg
Stage 6 Grade: 14 %
Stage 6 HR: 151 {beats}/min
Stage 6 SBP: 170 mmHg
Stage 6 Speed: 0 mph
Stage 7 Grade: 14 %
Stage 7 HR: 151 {beats}/min
Stage 7 Speed: 3.4 mph
Stage 8 DBP: 73 mmHg
Stage 8 Grade: 0 %
Stage 8 HR: 133 {beats}/min
Stage 8 SBP: 151 mmHg
Stage 8 Speed: 0 mph
Stage 9 DBP: 73 mmHg
Stage 9 Grade: 0 %
Stage 9 HR: 96 {beats}/min
Stage 9 SBP: 140 mmHg
Stage 9 Speed: 0 mph

## 2014-07-07 ENCOUNTER — Encounter (HOSPITAL_COMMUNITY): Payer: Self-pay | Admitting: *Deleted

## 2014-07-09 ENCOUNTER — Encounter: Payer: Self-pay | Admitting: Cardiovascular Disease

## 2014-07-27 DIAGNOSIS — M0589 Other rheumatoid arthritis with rheumatoid factor of multiple sites: Secondary | ICD-10-CM | POA: Diagnosis not present

## 2014-08-07 ENCOUNTER — Other Ambulatory Visit: Payer: Self-pay | Admitting: Family Medicine

## 2014-08-13 ENCOUNTER — Other Ambulatory Visit: Payer: Self-pay | Admitting: Family Medicine

## 2014-09-08 ENCOUNTER — Encounter: Payer: Self-pay | Admitting: Gastroenterology

## 2014-09-20 DIAGNOSIS — M0589 Other rheumatoid arthritis with rheumatoid factor of multiple sites: Secondary | ICD-10-CM | POA: Diagnosis not present

## 2014-10-10 ENCOUNTER — Other Ambulatory Visit: Payer: Self-pay | Admitting: Family Medicine

## 2014-10-13 ENCOUNTER — Ambulatory Visit (AMBULATORY_SURGERY_CENTER): Payer: Self-pay

## 2014-10-13 VITALS — Ht 74.0 in | Wt 225.0 lb

## 2014-10-13 DIAGNOSIS — Z860100 Personal history of colon polyps, unspecified: Secondary | ICD-10-CM

## 2014-10-13 NOTE — Progress Notes (Signed)
No allergies to eggs or soy No past problems with anesthesia No diet/weight loss meds No home oxygen  Refused emmi 

## 2014-10-19 ENCOUNTER — Encounter: Payer: Self-pay | Admitting: Gastroenterology

## 2014-10-21 DIAGNOSIS — Z9889 Other specified postprocedural states: Secondary | ICD-10-CM | POA: Diagnosis not present

## 2014-10-21 DIAGNOSIS — H5703 Miosis: Secondary | ICD-10-CM | POA: Diagnosis not present

## 2014-10-21 DIAGNOSIS — H02834 Dermatochalasis of left upper eyelid: Secondary | ICD-10-CM | POA: Diagnosis not present

## 2014-10-21 DIAGNOSIS — H17821 Peripheral opacity of cornea, right eye: Secondary | ICD-10-CM | POA: Diagnosis not present

## 2014-10-21 DIAGNOSIS — H10413 Chronic giant papillary conjunctivitis, bilateral: Secondary | ICD-10-CM | POA: Diagnosis not present

## 2014-10-21 DIAGNOSIS — H04123 Dry eye syndrome of bilateral lacrimal glands: Secondary | ICD-10-CM | POA: Diagnosis not present

## 2014-10-21 DIAGNOSIS — H2513 Age-related nuclear cataract, bilateral: Secondary | ICD-10-CM | POA: Diagnosis not present

## 2014-10-21 DIAGNOSIS — H02831 Dermatochalasis of right upper eyelid: Secondary | ICD-10-CM | POA: Diagnosis not present

## 2014-10-22 ENCOUNTER — Telehealth: Payer: Self-pay | Admitting: Gastroenterology

## 2014-10-22 NOTE — Telephone Encounter (Signed)
If you are okay with doing a double, I can move the patient to another day. There is not time to do this on the 19th.

## 2014-10-22 NOTE — Telephone Encounter (Signed)
Pt is scheduled for a colonscopy on Monday 10-25-14. He just received his recall letter for his EGD. He would like to know if he would be able to have both procedures at one time. He says he is on a fixed income, and it would save him money. If so he would also like r/s his procedure for Monday when he can have both done on the same day.

## 2014-10-22 NOTE — Telephone Encounter (Signed)
He says he cannot come here twice. I cannot double book in the [place].

## 2014-10-22 NOTE — Telephone Encounter (Signed)
Rescheduled to 12/06/14. Pre-visit 10/21

## 2014-10-22 NOTE — Telephone Encounter (Signed)
Won't that leave a hole in the schedule for Monday?

## 2014-10-22 NOTE — Telephone Encounter (Signed)
OK, schedule him as requested.  See if you could place someone else in his spot for Monday

## 2014-10-25 ENCOUNTER — Encounter: Payer: Medicare Other | Admitting: Gastroenterology

## 2014-11-10 ENCOUNTER — Other Ambulatory Visit: Payer: Self-pay | Admitting: Family Medicine

## 2014-11-10 ENCOUNTER — Encounter: Payer: Self-pay | Admitting: Gastroenterology

## 2014-11-15 DIAGNOSIS — M25532 Pain in left wrist: Secondary | ICD-10-CM | POA: Diagnosis not present

## 2014-11-15 DIAGNOSIS — M1990 Unspecified osteoarthritis, unspecified site: Secondary | ICD-10-CM | POA: Diagnosis not present

## 2014-11-15 DIAGNOSIS — M79671 Pain in right foot: Secondary | ICD-10-CM | POA: Diagnosis not present

## 2014-11-15 DIAGNOSIS — M79672 Pain in left foot: Secondary | ICD-10-CM | POA: Diagnosis not present

## 2014-11-15 DIAGNOSIS — M0589 Other rheumatoid arthritis with rheumatoid factor of multiple sites: Secondary | ICD-10-CM | POA: Diagnosis not present

## 2014-11-26 ENCOUNTER — Ambulatory Visit (AMBULATORY_SURGERY_CENTER): Payer: Self-pay

## 2014-11-26 VITALS — Ht 74.0 in | Wt 219.0 lb

## 2014-11-26 DIAGNOSIS — Z86010 Personal history of colonic polyps: Secondary | ICD-10-CM

## 2014-11-26 DIAGNOSIS — K2270 Barrett's esophagus without dysplasia: Secondary | ICD-10-CM

## 2014-11-26 NOTE — Progress Notes (Signed)
No egg or soy allergies Not on home 02 No previous anesthesia complications No diet or weight loss meds 

## 2014-11-30 DIAGNOSIS — Z79899 Other long term (current) drug therapy: Secondary | ICD-10-CM | POA: Diagnosis not present

## 2014-11-30 DIAGNOSIS — M2550 Pain in unspecified joint: Secondary | ICD-10-CM | POA: Diagnosis not present

## 2014-11-30 DIAGNOSIS — M7661 Achilles tendinitis, right leg: Secondary | ICD-10-CM | POA: Diagnosis not present

## 2014-11-30 DIAGNOSIS — M0579 Rheumatoid arthritis with rheumatoid factor of multiple sites without organ or systems involvement: Secondary | ICD-10-CM | POA: Diagnosis not present

## 2014-11-30 DIAGNOSIS — Z9229 Personal history of other drug therapy: Secondary | ICD-10-CM | POA: Diagnosis not present

## 2014-12-06 ENCOUNTER — Encounter: Payer: Medicare Other | Admitting: Gastroenterology

## 2014-12-09 ENCOUNTER — Encounter: Payer: Self-pay | Admitting: Gastroenterology

## 2014-12-09 ENCOUNTER — Ambulatory Visit (AMBULATORY_SURGERY_CENTER): Payer: Medicare Other | Admitting: Gastroenterology

## 2014-12-09 VITALS — BP 125/81 | HR 63 | Temp 95.9°F | Resp 27 | Ht 74.0 in | Wt 219.0 lb

## 2014-12-09 DIAGNOSIS — K2270 Barrett's esophagus without dysplasia: Secondary | ICD-10-CM | POA: Diagnosis not present

## 2014-12-09 DIAGNOSIS — I10 Essential (primary) hypertension: Secondary | ICD-10-CM | POA: Diagnosis not present

## 2014-12-09 DIAGNOSIS — Z86010 Personal history of colonic polyps: Secondary | ICD-10-CM | POA: Diagnosis not present

## 2014-12-09 DIAGNOSIS — D123 Benign neoplasm of transverse colon: Secondary | ICD-10-CM | POA: Diagnosis not present

## 2014-12-09 DIAGNOSIS — D120 Benign neoplasm of cecum: Secondary | ICD-10-CM | POA: Diagnosis not present

## 2014-12-09 DIAGNOSIS — J449 Chronic obstructive pulmonary disease, unspecified: Secondary | ICD-10-CM | POA: Diagnosis not present

## 2014-12-09 DIAGNOSIS — M069 Rheumatoid arthritis, unspecified: Secondary | ICD-10-CM | POA: Diagnosis not present

## 2014-12-09 HISTORY — PX: COLONOSCOPY: SHX174

## 2014-12-09 MED ORDER — HYDROCORTISONE 1 % EX OINT: 1.0000 "application " | TOPICAL_OINTMENT | Freq: Two times a day (BID) | CUTANEOUS | Status: DC

## 2014-12-09 MED ORDER — SODIUM CHLORIDE 0.9 % IV SOLN: 500.0000 mL | INTRAVENOUS | Status: DC

## 2014-12-09 NOTE — Op Note (Signed)
[place]  [address]. [place], [address]   COLONOSCOPY PROCEDURE REPORT  PATIENT: Tyrone Roman, Tyrone Roman  MR#: [id number] BIRTHDATE: 1945/11/12 , 16  yrs. old GENDER: male ENDOSCOPIST: Harl Bowie, MD REFERRED [id number] Sarajane Jews, M.D. PROCEDURE DATE:  12/09/2014 PROCEDURE:   Colonoscopy, surveillance , Colonoscopy with cold biopsy polypectomy, and Colonoscopy with snare polypectomy First Screening Colonoscopy - Avg.  risk and is 50 yrs.  old or older - No.  Prior Negative Screening - Now for repeat screening. N/A  History of Adenoma - Now for follow-up colonoscopy & has been > or = to 3 yrs.  Yes hx of adenoma.  Has been 3 or more years since last colonoscopy.  Polyps removed today? Yes ASA CLASS:   Class II INDICATIONS:Surveillance due to prior colonic neoplasia and PH Colon Adenoma. MEDICATIONS: Residual sedation present  DESCRIPTION OF PROCEDURE:   After the risks benefits and alternatives of the procedure were thoroughly explained, informed consent was obtained.  The digital rectal exam revealed no abnormalities of the rectum.   The LB [id number]  endoscope was introduced through the anus and advanced to the cecum, which was identified by both the appendix and ileocecal valve. No adverse events experienced.   The quality of the prep was good.  The instrument was then slowly withdrawn as the colon was fully examined. Estimated blood loss is zero unless otherwise noted in this procedure report.   COLON FINDINGS: A sessile polyp ranging between 3-69mm in size was found at the cecum. Polypectomy was performed using cold forceps. Two sessile polyps ranging between 5-52mm in size were found in the transverse colon.  A polypectomy was performed with a cold snare. The resection was complete, the polyp tissue was completely retrieved and sent to histology.   Four sessile polyps ranging between 3-30mm in size were found in the transverse colon.  A polypectomy  was performed with cold forceps.  The resection was complete, the polyp tissue was completely retrieved and sent to histology.   Diverticula was found in the descending and sigmoid colon.  The opening was medium sized.  Retroflexed views revealed internal hemorrhoids. The time to cecum = 11.6 Withdrawal time = 19.6   The scope was withdrawn and the procedure completed. COMPLICATIONS: There were no immediate complications.  ENDOSCOPIC IMPRESSION: 1.   Sessile polyp ranging between 3-22mm in size was found at the cecum; biopsy was performed using cold forceps 2.   Two sessile polyps ranging between 5-80mm in size were found in the transverse colon; polypectomy was performed with a cold snare 3.   Four sessile polyps ranging between 3-51mm in size were found in the transverse colon; polypectomy was performed with cold forceps 4.   Diverticula in the descending and sigmoid colon  RECOMMENDATIONS: 1.  If the polyp(s) removed today are proven to be adenomatous (pre-cancerous) polyps, you will need a colonoscopy in 3 years. Otherwise you should continue to follow colorectal cancer screening guidelines for "routine risk" patients with a colonoscopy in 10 years.  You will receive a letter within 1-2 weeks with the results of your biopsy as well as final recommendations.  Please call my office if you have not received a letter after 3 weeks. 2.  Await pathology results  eSigned:  Harl Bowie, MD 12/09/2014 3:27 PM

## 2014-12-09 NOTE — Op Note (Signed)
[place]  [address]. [place], [address]   ENDOSCOPY PROCEDURE REPORT  PATIENT: Tyrone Roman, Tyrone Roman  MR#: [id number] BIRTHDATE: 1945/05/23 , 73  yrs. old GENDER: male ENDOSCOPIST: Harl Bowie, MD REFERRED BY:  [name], M.D. PROCEDURE DATE:  12/09/2014 PROCEDURE:  EGD, screening ASA CLASS:     Class II INDICATIONS:  screening for Barrett's. MEDICATIONS: Propofol 250 mg IV TOPICAL ANESTHETIC: none  DESCRIPTION OF PROCEDURE: After the risks benefits and alternatives of the procedure were thoroughly explained, informed consent was obtained.  The LB [id number] endoscope was introduced through the mouth and advanced to the second portion of the duodenum , Without limitations.  The instrument was slowly withdrawn as the mucosa was fully examined.      ESOPHAGUS: The mucosa of the esophagus appeared normal.   Regular Z-line at 40cm  STOMACH: The mucosa of the stomach appeared normal.  DUODENUM: The duodenal mucosa showed no abnormalities in the bulb and 2nd part of the duodenum. Retroflexed views revealed no abnormalities.     The scope was then withdrawn from the patient and the procedure completed.  COMPLICATIONS: There were no immediate complications.  ENDOSCOPIC IMPRESSION: Normal EGD  RECOMMENDATIONS: Continue PPI    eSigned:  Harl Bowie, MD 12/09/2014 3:21 PM

## 2014-12-09 NOTE — Progress Notes (Signed)
Called to room to assist during endoscopic procedure.  Patient ID and intended procedure confirmed with present staff. Received instructions for my participation in the procedure from the performing physician.  

## 2014-12-09 NOTE — Progress Notes (Signed)
Report to PACU, RN, vss, BBS= Clear.  

## 2014-12-09 NOTE — Patient Instructions (Signed)

## 2014-12-10 ENCOUNTER — Telehealth: Payer: Self-pay | Admitting: *Deleted

## 2014-12-10 NOTE — Telephone Encounter (Signed)
  Follow up Call-  Call back number 12/09/2014  Post procedure Call Back phone  # [phone]  Permission to leave phone message Yes     Patient questions:  Do you have a fever, pain , or abdominal swelling? No. Pain Score  0 *  Have you tolerated food without any problems? Yes.    Have you been able to return to your normal activities? Yes.    Do you have any questions about your discharge instructions: Diet   No. Medications  No. Follow up visit  No.  Do you have questions or concerns about your Care? No.  Actions: * If pain score is 4 or above: No action needed, pain <4.

## 2014-12-20 ENCOUNTER — Encounter: Payer: Self-pay | Admitting: *Deleted

## 2014-12-23 NOTE — Progress Notes (Signed)
Patient ID: Tyrone Roman, male   DOB: [birth year], 69 y.o.   MRN: [id number]     Cardiology Office Note   Date:  12/27/2014   ID:  Tyrone Roman, DOB [birth year], MRN [id number]  PCP:  Tyrone Morale, MD  Cardiologist:   Tyrone Rouge, MD   Chief Complaint  Patient presents with  . Annual Exam    no refills      History of Present Illness: Tyrone Roman is a 69 y.o. male who presents for evaluation of CAD.  Distantly seen by Dr Tyrone Roman over 3 years ago His note from 2013 indicates CAD/PCI But no details Review of his Epic Chart indicates 05/2009 had stenting of the proximal LAD by Dr Tyrone Roman with left dominant system and no other disease Relook cath by Dr Tyrone Roman 06/2009 no restenosis  Done for dyspnea thought to be anginal equivalent  Seen by pulmonary for COPD/Asthma and asbestosis Exposure with pleural plaquing by CTA 2012 no PE  Has had some progressive exertional dyspnea last 3-4 months.  Discussed ETT vs cath.  He initially preferred cath saying he did not think stress test  Was accurate but after discussing risks including stroke he was ok starting with ETT  No chest pain or dypsnea at rest   07/05/13 ETT normal 9 minutes on Bruce HR 151     Past Medical History  Diagnosis Date  . CAD (coronary artery disease)     sees Dr. Mar Roman  . GERD (gastroesophageal reflux disease)   . Barrett's esophageal ulceration   . BPH (benign prostatic hyperplasia)   . Colonic polyp   . Hyperlipidemia   . SOB (shortness of breath)   . Dysphagia   . Pre-operative cardiovascular examination   . Contact with or exposure to venereal diseases   . Laceration of scalp   . Concussion with loss of consciousness of 30 minutes or less   . Nephrolithiasis   . Diverticulosis of colon   . Contact dermatitis   . COPD (chronic obstructive pulmonary disease) ([place])     sees Dr. Kara Roman   . Hypertension   . Myocardial infarction [facility])     2011- stent placed  . Rheumatoid arthritis ([place])     sees  Dr. Tobie Roman   . Cataract     both eyes    Past Surgical History  Procedure Laterality Date  . Tonsillectomy    . Left knee surgery    . Colonoscopy  03/01/09    per Dr. Deatra Ina; had benign polyps, repeat 5 years   . Egd with dilatation  02/17/09    Barretts esophagus   . Heart stent    . Left knee surgery       Current Outpatient Prescriptions  Medication Sig Dispense Refill  . aspirin EC 81 MG tablet Take 81 mg by mouth daily.    . cetirizine (ZYRTEC) 10 MG tablet Take 10 mg by mouth daily.    . folic acid (FOLVITE) 1 MG tablet Take 1 tablet (1 mg total) by mouth daily. 1 tablet 0  . methotrexate (RHEUMATREX) 2.5 MG tablet TAKE 10 TABS BY MOUTH ONCE A WEEK.  1  . metoprolol succinate (TOPROL-XL) 25 MG 24 hr tablet TAKE ONE TABLET BY MOUTH ONE TIME DAILY 90 tablet 0  . Multiple Vitamin (MULTIVITAMIN WITH MINERALS) TABS Take 1 tablet by mouth daily.    . nitroGLYCERIN (NITROSTAT) 0.4 MG SL tablet Place 0.4 mg under the tongue every 5 (five)  minutes as needed for chest pain (3 doses max).    Marland Kitchen omeprazole (PRILOSEC) 40 MG capsule TAKE ONE CAPSULE BY MOUTH ONE TIME DAILY 90 capsule 1  . senna-docusate (SB DOCUSATE SODIUM/SENNA) 8.6-50 MG per tablet Take 1 tablet by mouth daily as needed. For constipation    . sildenafil (REVATIO) 20 MG tablet Take 20 mg by mouth daily as needed (for cough & congestion).   3  . simvastatin (ZOCOR) 40 MG tablet TAKE ONE TABLET BY MOUTH AT BEDTIME  90 tablet 3  . tamsulosin (FLOMAX) 0.4 MG CAPS capsule TAKE ONE CAPSULE BY MOUTH ONE TIME DAILY 90 capsule 1  . zolpidem (AMBIEN) 10 MG tablet Take 5 mg by mouth daily.     No current facility-administered medications for this visit.    Allergies:   Cephalexin; Clarithromycin; and Lidoderm    Social History:  The patient  reports that he quit smoking about 26 years ago. His smoking use included Cigarettes. He has a 20 pack-year smoking history. He has never used smokeless tobacco. He reports that he  drinks alcohol. He reports that he does not use illicit drugs.   Family History:  The patient's family history includes Arthritis in his father and mother; Cancer in his maternal grandmother; Colon cancer in his father and paternal uncle; Diabetes in his paternal uncle; Heart attack in his father; Prostate cancer in his father; Skin cancer in his daughter. There is no history of Esophageal cancer, Stomach cancer, or Rectal cancer.    ROS:  Please see the history of present illness.   Otherwise, review of systems are positive for none.   All other systems are reviewed and negative.    PHYSICAL EXAM: VS:  Ht 6\' 2"  ([id number] m)  Wt 103.021 kg (227 lb 1.9 oz)  BMI 29.15 kg/m2 , BMI Body mass index is 29.15 kg/(m^2). Affect appropriate Healthy:  appears stated age 27: normal Neck supple with no adenopathy JVP normal no bruits no thyromegaly Lungs clear with no wheezing and good diaphragmatic motion Heart:  S1/S2 no murmur, no rub, gallop or click PMI normal Abdomen: benighn, BS positve, no tenderness, no AAA no bruit.  No HSM or HJR Distal pulses intact with no bruits No edema Neuro non-focal Skin warm and dry No muscular weakness    EKG:  02/23/12  SR normal ECG  06/01/14 SR rate 73 normal    Recent Labs: 06/01/2014: ALT 16    Lipid Panel    Component Value Date/Time   CHOL 117 06/01/2014 1204   TRIG 69.0 06/01/2014 1204   HDL 41.90 06/01/2014 1204   CHOLHDL 3 06/01/2014 1204   VLDL 13.8 06/01/2014 1204   LDLCALC 61 06/01/2014 1204      Wt Readings from Last 3 Encounters:  12/27/14 103.021 kg (227 lb 1.9 oz)  12/09/14 99.338 kg (219 lb)  11/26/14 99.338 kg (219 lb)      Other studies Reviewed: Additional studies/ records that were reviewed today include: Cath films 2011 see HPI and Epic notes.    ASSESSMENT AND PLAN:  1.  CAD:  Normal ECG  Normal ETT  07/05/2013  Continue medical Rx  2. Chol:  On statin  Lab Results  Component Value Date   LDLCALC 61  06/01/2014  3. Arthritis  New rheumatologis on [place]   On rheumatrex and methotrexate 4. COPD:  Needs pneumonia vax gave flu shot today             Current medicines are  reviewed at length with the patient today.  The patient does not have concerns regarding medicines.  The following changes have been made: SL nitro called in   Labs/ tests ordered today include:  None   No orders of the defined types were placed in this encounter.     Disposition:   FU with me 6 months     Signed, Tyrone Rouge, MD  12/27/2014 8:25 AM    [facility] [place], [place], [place]  [address] Phone: [phone]; Fax: [phone]

## 2014-12-27 ENCOUNTER — Encounter: Payer: Self-pay | Admitting: Cardiovascular Disease

## 2014-12-27 ENCOUNTER — Ambulatory Visit (INDEPENDENT_AMBULATORY_CARE_PROVIDER_SITE_OTHER): Payer: Medicare Other | Admitting: Cardiovascular Disease

## 2014-12-27 VITALS — BP 120/70 | HR 77 | Resp 11 | Ht 74.0 in | Wt 227.1 lb

## 2014-12-27 DIAGNOSIS — Z23 Encounter for immunization: Secondary | ICD-10-CM

## 2014-12-27 DIAGNOSIS — Z79899 Other long term (current) drug therapy: Secondary | ICD-10-CM | POA: Diagnosis not present

## 2014-12-27 DIAGNOSIS — I2510 Atherosclerotic heart disease of native coronary artery without angina pectoris: Secondary | ICD-10-CM | POA: Diagnosis not present

## 2014-12-27 MED ORDER — NITROGLYCERIN 0.4 MG SL SUBL: 0.4000 mg | SUBLINGUAL_TABLET | SUBLINGUAL | Status: DC | PRN

## 2014-12-27 NOTE — Patient Instructions (Signed)
Medication Instructions:  Your physician recommends that you continue on your current medications as directed. Please refer to the Current Medication list given to you today.   Labwork: NONE  Testing/Procedures: NONE  Follow-Up: Your physician wants you to follow-up in: 6 MONTHS WITH DR NISHAN You will receive a reminder letter in the mail two months in advance. If you don't receive a letter, please call our office to schedule the follow-up appointment.  Any Other Special Instructions Will Be Listed Below (If Applicable).     If you need a refill on your cardiac medications before your next appointment, please call your pharmacy.   

## 2015-01-06 ENCOUNTER — Encounter: Payer: Self-pay | Admitting: Gastroenterology

## 2015-01-06 DIAGNOSIS — M0579 Rheumatoid arthritis with rheumatoid factor of multiple sites without organ or systems involvement: Secondary | ICD-10-CM | POA: Diagnosis not present

## 2015-01-24 DIAGNOSIS — M0579 Rheumatoid arthritis with rheumatoid factor of multiple sites without organ or systems involvement: Secondary | ICD-10-CM | POA: Diagnosis not present

## 2015-02-03 ENCOUNTER — Other Ambulatory Visit: Payer: Self-pay | Admitting: Family Medicine

## 2015-02-03 NOTE — Telephone Encounter (Signed)
Pt called saying he needs a refill of the following medications: 1. Omeprazole 2. Tamsulosin 3. Zolpidem 4. Metoprolol  He uses [facility] in [address]. Please give him a call if needed.  Pt's ph# [phone] Thank you.

## 2015-02-04 NOTE — Telephone Encounter (Signed)
Refill Zolpidem for 6 months and refill the other meds for one year each

## 2015-02-16 DIAGNOSIS — M2550 Pain in unspecified joint: Secondary | ICD-10-CM | POA: Diagnosis not present

## 2015-02-16 DIAGNOSIS — Z79899 Other long term (current) drug therapy: Secondary | ICD-10-CM | POA: Diagnosis not present

## 2015-02-16 DIAGNOSIS — M7660 Achilles tendinitis, unspecified leg: Secondary | ICD-10-CM | POA: Diagnosis not present

## 2015-02-16 DIAGNOSIS — M0579 Rheumatoid arthritis with rheumatoid factor of multiple sites without organ or systems involvement: Secondary | ICD-10-CM | POA: Diagnosis not present

## 2015-02-21 DIAGNOSIS — M0579 Rheumatoid arthritis with rheumatoid factor of multiple sites without organ or systems involvement: Secondary | ICD-10-CM | POA: Diagnosis not present

## 2015-02-21 DIAGNOSIS — M7660 Achilles tendinitis, unspecified leg: Secondary | ICD-10-CM | POA: Diagnosis not present

## 2015-02-21 DIAGNOSIS — Z79899 Other long term (current) drug therapy: Secondary | ICD-10-CM | POA: Diagnosis not present

## 2015-03-31 ENCOUNTER — Encounter: Payer: Self-pay | Admitting: Family Medicine

## 2015-03-31 ENCOUNTER — Ambulatory Visit (INDEPENDENT_AMBULATORY_CARE_PROVIDER_SITE_OTHER): Payer: Medicare Other | Admitting: Family Medicine

## 2015-03-31 VITALS — BP 110/73 | HR 105 | Temp 98.7°F | Ht 74.0 in | Wt 232.0 lb

## 2015-03-31 DIAGNOSIS — J209 Acute bronchitis, unspecified: Secondary | ICD-10-CM

## 2015-03-31 MED ORDER — HYDROCODONE-HOMATROPINE 5-1.5 MG/5ML PO SYRP: 5.0000 mL | ORAL_SOLUTION | ORAL | Status: DC | PRN

## 2015-03-31 MED ORDER — AMOXICILLIN-POT CLAVULANATE 875-125 MG PO TABS: 1.0000 | ORAL_TABLET | Freq: Two times a day (BID) | ORAL | Status: DC

## 2015-03-31 NOTE — Progress Notes (Signed)
   Subjective:    Patient ID: Tyrone Roman, male    DOB: [birth year], 70 y.o.   MRN: [id number]  HPI Here for one week of stuffy head, PND, chest congestion and coughing up green sputum.    Review of Systems  Constitutional: Negative.   HENT: Positive for congestion, postnasal drip and sore throat. Negative for sinus pressure.   Eyes: Negative.   Respiratory: Positive for cough and chest tightness. Negative for shortness of breath and wheezing.   Cardiovascular: Negative.        Objective:   Physical Exam  Constitutional: He appears well-developed and well-nourished.  HENT:  Right Ear: External ear normal.  Left Ear: External ear normal.  Nose: Nose normal.  Mouth/Throat: Oropharynx is clear and moist.  Eyes: Conjunctivae are normal.  Pulmonary/Chest: Effort normal. No respiratory distress. He has no wheezes. He has no rales.  Scattered rhonchi   Lymphadenopathy:    He has no cervical adenopathy.          Assessment & Plan:  Bronchitis, treat with Augmentin.

## 2015-03-31 NOTE — Progress Notes (Signed)
Pre visit review using our clinic review tool, if applicable. No additional management support is needed unless otherwise documented below in the visit note. 

## 2015-04-18 ENCOUNTER — Telehealth: Payer: Self-pay | Admitting: Family Medicine

## 2015-04-18 DIAGNOSIS — Z79899 Other long term (current) drug therapy: Secondary | ICD-10-CM | POA: Diagnosis not present

## 2015-04-18 DIAGNOSIS — M0579 Rheumatoid arthritis with rheumatoid factor of multiple sites without organ or systems involvement: Secondary | ICD-10-CM | POA: Diagnosis not present

## 2015-04-18 MED ORDER — SIMVASTATIN 40 MG PO TABS: 40.0000 mg | ORAL_TABLET | Freq: Every day | ORAL | Status: DC

## 2015-04-18 NOTE — Telephone Encounter (Signed)
Pt request refill  simvastatin (ZOCOR) 40 MG tablet  Pt states he called pharm last week, but we have not received. Pt is now out for 2 days. [facility]

## 2015-04-18 NOTE — Telephone Encounter (Signed)
I sent script e-scribe and spoke with pt. 

## 2015-05-02 DIAGNOSIS — Z79899 Other long term (current) drug therapy: Secondary | ICD-10-CM | POA: Diagnosis not present

## 2015-05-02 DIAGNOSIS — M0579 Rheumatoid arthritis with rheumatoid factor of multiple sites without organ or systems involvement: Secondary | ICD-10-CM | POA: Diagnosis not present

## 2015-05-02 DIAGNOSIS — M2550 Pain in unspecified joint: Secondary | ICD-10-CM | POA: Diagnosis not present

## 2015-05-02 DIAGNOSIS — M7660 Achilles tendinitis, unspecified leg: Secondary | ICD-10-CM | POA: Diagnosis not present

## 2015-05-05 ENCOUNTER — Telehealth: Payer: Self-pay | Admitting: Family Medicine

## 2015-05-05 NOTE — Telephone Encounter (Signed)
Pharm needs augmentin strength. Med was called into pharm by brooke

## 2015-05-05 NOTE — Telephone Encounter (Signed)
Pt states his bronchitis has returned. Pt was just seen 2/23 for this same issue. Would like to know if Dr Sarajane Jews will call in something. Pt still has cough med.   [facility]

## 2015-05-05 NOTE — Telephone Encounter (Signed)
Per Dr. Sarajane Jews order Augmentin bid x 10 days.

## 2015-05-05 NOTE — Telephone Encounter (Signed)
Called pharmacy and clarified Rx. Thanks!

## 2015-05-05 NOTE — Telephone Encounter (Signed)
Patient notified

## 2015-05-05 NOTE — Telephone Encounter (Signed)
Rx called in as directed.   

## 2015-05-10 ENCOUNTER — Telehealth: Payer: Self-pay | Admitting: Family Medicine

## 2015-05-10 ENCOUNTER — Encounter: Payer: Self-pay | Admitting: Family Medicine

## 2015-05-10 ENCOUNTER — Ambulatory Visit (INDEPENDENT_AMBULATORY_CARE_PROVIDER_SITE_OTHER): Payer: Medicare Other | Admitting: Family Medicine

## 2015-05-10 VITALS — BP 102/64 | HR 93 | Temp 98.6°F | Ht 74.0 in | Wt 229.4 lb

## 2015-05-10 DIAGNOSIS — J209 Acute bronchitis, unspecified: Secondary | ICD-10-CM

## 2015-05-10 DIAGNOSIS — J329 Chronic sinusitis, unspecified: Secondary | ICD-10-CM | POA: Diagnosis not present

## 2015-05-10 DIAGNOSIS — J310 Chronic rhinitis: Secondary | ICD-10-CM

## 2015-05-10 MED ORDER — PREDNISONE 20 MG PO TABS: ORAL_TABLET | ORAL | Status: DC

## 2015-05-10 MED ORDER — HYDROCODONE-HOMATROPINE 5-1.5 MG/5ML PO SYRP: 5.0000 mL | ORAL_SOLUTION | Freq: Three times a day (TID) | ORAL | Status: DC | PRN

## 2015-05-10 NOTE — Patient Instructions (Addendum)
Follow up if not improving significantly over the next  3-5 days   Start the prednisone today.  Continue and complete the antibiotic.  Use caution with the cough medication.  The care immediately if he were having worsening symptoms.

## 2015-05-10 NOTE — Progress Notes (Signed)
HPI:  Tyrone Roman is a  Pleasant 70 year old with a past medical history significant for COPD, seasonal allergies, chronic shortness of breath with evaluation with cardiology last year and pulmonologist in the past, acid reflux that he reports is well controlled and coronary artery disease followed by his cardiologist, here for an acute visit for:   Sinus congestion and cough: - Reports started several weeks ago - Was almost resolved with an antibiotic provided by his PCP , then recurred - symptoms include nasal congestion, postnasal drip, sneezing, cough , occasional wheezing, possible increased shortness of breath from his baseline - denies fevers, nausea, vomiting, diarrhea, body aches - mucus is clear at times and they occur at other times - he has been started on another course of Augmentin  By his PCP and reports feels a little better  Now - Takes antihistamine daily for his allergies - Not on any chronic inhalers or Flonase - On very low dose of prednisone intermittently in the past for his arthritis , currently not on prednisone - he requested a refill on the Hycodan that his PCP provider  ROS: See pertinent positives and negatives per HPI.  Past Medical History  Diagnosis Date  . CAD (coronary artery disease)     sees Dr. Mar Daring  . GERD (gastroesophageal reflux disease)   . Barrett's esophageal ulceration   . BPH (benign prostatic hyperplasia)   . Colonic polyp   . Hyperlipidemia   . SOB (shortness of breath)   . Dysphagia   . Pre-operative cardiovascular examination   . Contact with or exposure to venereal diseases   . Laceration of scalp   . Concussion with loss of consciousness of 30 minutes or less   . Nephrolithiasis   . Diverticulosis of colon   . Contact dermatitis   . COPD (chronic obstructive pulmonary disease) ([place])     sees Dr. Kara Mead   . Hypertension   . Myocardial infarction [facility])     2011- stent placed  . Rheumatoid arthritis ([place])     sees  Dr. Tobie Lords   . Cataract     both eyes    Past Surgical History  Procedure Laterality Date  . Tonsillectomy    . Left knee surgery    . Colonoscopy  03/01/09    per Dr. Deatra Ina; had benign polyps, repeat 5 years   . Egd with dilatation  02/17/09    Barretts esophagus   . Heart stent    . Left knee surgery      Family History  Problem Relation Age of Onset  . Heart attack Father     cardiovascular disorder  . Arthritis Father     family hx  . Colon cancer Father     mets  . Prostate cancer Father     1st degree relative  . Arthritis Mother   . Diabetes Paternal Uncle   . Colon cancer Paternal Uncle   . Cancer Maternal Grandmother   . Skin cancer Daughter   . Esophageal cancer Neg Hx   . Stomach cancer Neg Hx   . Rectal cancer Neg Hx     Social History   Social History  . Marital Status: Divorced    Spouse Name: N/A  . Number of Children: 2  . Years of Education: N/A   Occupational History  . ENGINEER    Social History Main Topics  . Smoking status: Former Smoker -- 1.00 packs/day for 20 years  Types: Cigarettes    Quit date: 10/12/1988  . Smokeless tobacco: Never Used     Comment: 1960's   . Alcohol Use: 0.0 oz/week    0 Standard drinks or equivalent per week     Comment: rare  . Drug Use: No  . Sexual Activity: Not Asked   Other Topics Concern  . 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      Current outpatient prescriptions:  .  amoxicillin-clavulanate (AUGMENTIN) 875-125 MG tablet, Take 1 tablet by mouth 2 (two) times daily., Disp: 20 tablet, Rfl: 0 .  aspirin EC 81 MG tablet, Take 81 mg by mouth daily., Disp: , Rfl:  .  cetirizine (ZYRTEC) 10 MG tablet, Take 10 mg by mouth daily., Disp: , Rfl:  .  folic acid (FOLVITE) 1 MG tablet, Take 1 tablet (1 mg total) by mouth daily., Disp: 1 tablet, Rfl: 0 .  methotrexate (RHEUMATREX) 2.5 MG tablet, TAKE 10 TABS BY  MOUTH ONCE A WEEK., Disp: , Rfl: 1 .  metoprolol succinate (TOPROL-XL) 25 MG 24 hr tablet, TAKE ONE TABLET BY MOUTH ONE TIME DAILY, Disp: 90 tablet, Rfl: 3 .  Multiple Vitamin (MULTIVITAMIN WITH MINERALS) TABS, Take 1 tablet by mouth daily., Disp: , Rfl:  .  nitroGLYCERIN (NITROSTAT) 0.4 MG SL tablet, Place 1 tablet (0.4 mg total) under the tongue every 5 (five) minutes as needed for chest pain (3 doses max)., Disp: 25 tablet, Rfl: 3 .  omeprazole (PRILOSEC) 40 MG capsule, TAKE ONE CAPSULE BY MOUTH ONE TIME DAILY, Disp: 90 capsule, Rfl: 3 .  senna-docusate (SB DOCUSATE SODIUM/SENNA) 8.6-50 MG per tablet, Take 1 tablet by mouth daily as needed. For constipation, Disp: , Rfl:  .  sildenafil (REVATIO) 20 MG tablet, Take 20 mg by mouth daily as needed (for cough & congestion). , Disp: , Rfl: 3 .  simvastatin (ZOCOR) 40 MG tablet, Take 1 tablet (40 mg total) by mouth at bedtime., Disp: 90 tablet, Rfl: 0 .  tamsulosin (FLOMAX) 0.4 MG CAPS capsule, TAKE ONE CAPSULE BY MOUTH ONE TIME DAILY, Disp: 90 capsule, Rfl: 3 .  zolpidem (AMBIEN) 10 MG tablet, TAKE 1 TABLET BY MOUTH AT BEDTIME, Disp: 30 tablet, Rfl: 5 .  HYDROcodone-homatropine (HYCODAN) 5-1.5 MG/5ML syrup, Take 5 mLs by mouth every 8 (eight) hours as needed for cough., Disp: 120 mL, Rfl: 0 .  predniSONE (DELTASONE) 20 MG tablet, 40mg  (2 tablets) daily for 3 days, then 20mg (1 tablet) daily for 2 days, then 10mg ( one half tablet )daily for 2 days, Disp: 9 tablet, Rfl: 0  EXAM:  Filed Vitals:   05/10/15 1535  BP: 102/64  Pulse: 93  Temp: 98.6 F (37 C)    Body mass index is 29.44 kg/(m^2).  GENERAL: vitals reviewed and listed above, alert, oriented, appears well hydrated and in no acute distress  HEENT: atraumatic, conjunttiva clear, no obvious abnormalities on inspection of external nose and ears, normal appearance of ear canals and TMs, clear nasal congestion, mild post oropharyngeal erythema with PND, no tonsillar edema or exudate, no sinus  TTP  NECK: no obvious masses on inspection  LUNGS: clear to auscultation bilaterally, no wheezes, rales or rhonchi, good air movement  CV: HRRR, no peripheral edema  MS: moves all extremities without noticeable abnormality  PSYCH: pleasant and cooperative, no obvious depression or anxiety  ASSESSMENT AND PLAN:  Discussed the following assessment and plan:  Rhinosinusitis  Acute  bronchitis, unspecified organism  -continue abx, prednisone taper, cough medication refilled with caution of risks -close follow up if not responding as expected -Patient advised to return or notify a doctor immediately if symptoms worsen or persist or new concerns arise.  Patient Instructions  Follow up if not improving significantly over the next  3-5 days   Start the prednisone today.  Continue and complete the antibiotic.  Use caution with the cough medication.  The care immediately if he were having worsening symptoms.     Colin Benton R.

## 2015-05-10 NOTE — Progress Notes (Signed)
Pre visit review using our clinic review tool, if applicable. No additional management support is needed unless otherwise documented below in the visit note. 

## 2015-05-10 NOTE — Telephone Encounter (Signed)
Patient Name: Tyrone Roman  DOB: Nov 08, 1945    Initial Comment Caller states, more congestion than last week, Dx with Bronchitis 2 weeks ago, has been on antibiotics since then ; sporatic chills, and labored breathing    Nurse Assessment  Nurse: Leilani Merl, RN, Heather Date/Time (Eastern Time): 05/10/2015 2:19:27 PM  Confirm and document reason for call. If symptomatic, describe symptoms. You must click the next button to save text entered. ---Caller states, more congestion than last week, Dx with Bronchitis 2 weeks ago, has been on antibiotics since then ; sporatic chills, and labored breathing  Has the patient traveled out of the country within the last 30 days? ---Not Applicable  Does the patient have any new or worsening symptoms? ---Yes  Will a triage be completed? ---Yes  Related visit to physician within the last 2 weeks? ---Yes  Does the PT have any chronic conditions? (i.e. diabetes, asthma, etc.) ---Yes  List chronic conditions. ---See MR  Is this a behavioral health or substance abuse call? ---No     Guidelines    Guideline Title Affirmed Question Affirmed Notes  Cough - Acute Productive Wheezing is present    Final Disposition User   See Physician within 4 Hours (or PCP triage) Leilani Merl, RN, Nira Conn    Comments  Appt made with Dr. Maudie Mercury at 3:30 today.   Referrals  REFERRED TO PCP OFFICE   Disagree/Comply: Comply

## 2015-05-11 NOTE — Telephone Encounter (Signed)
Can you check on this? We have openings today, not sure why pt is on Dr. Julianne Rice schedule?

## 2015-05-13 NOTE — Telephone Encounter (Signed)
Sorry - I just saw this. It's hard for me to catch when I am not in the office.

## 2015-06-13 DIAGNOSIS — M0579 Rheumatoid arthritis with rheumatoid factor of multiple sites without organ or systems involvement: Secondary | ICD-10-CM | POA: Diagnosis not present

## 2015-07-20 ENCOUNTER — Other Ambulatory Visit: Payer: Self-pay | Admitting: Family Medicine

## 2015-07-20 ENCOUNTER — Other Ambulatory Visit: Payer: Self-pay | Admitting: *Deleted

## 2015-07-20 MED ORDER — SIMVASTATIN 40 MG PO TABS: 40.0000 mg | ORAL_TABLET | Freq: Every day | ORAL | Status: DC

## 2015-07-20 NOTE — Telephone Encounter (Signed)
Pt needs needs refill  simvastatin (ZOCOR) 40 MG tablet   However, pt was told he needs follow up appointment in order for refills. Pt is out of his med. Can you send a 90 day refill to   [place]  Pt has made follow up appointment.

## 2015-07-20 NOTE — Telephone Encounter (Signed)
Sent in Rx for Simvastatin 40 mg to [place]. Notified patient.

## 2015-07-25 ENCOUNTER — Telehealth: Payer: Self-pay | Admitting: Family Medicine

## 2015-07-25 DIAGNOSIS — E785 Hyperlipidemia, unspecified: Secondary | ICD-10-CM | POA: Diagnosis not present

## 2015-07-25 DIAGNOSIS — M0579 Rheumatoid arthritis with rheumatoid factor of multiple sites without organ or systems involvement: Secondary | ICD-10-CM | POA: Diagnosis not present

## 2015-07-25 DIAGNOSIS — Z79899 Other long term (current) drug therapy: Secondary | ICD-10-CM | POA: Diagnosis not present

## 2015-07-25 NOTE — Telephone Encounter (Signed)
Pt told [facility] Dr Sarajane Jews approved him to have his lipid panel done there today. Alyse Low needs a dx code for pt cholesterol lab draw..  Please call [phone] ext [phone] before the end of day with that code.

## 2015-07-25 NOTE — Telephone Encounter (Signed)
Per Dr. Sarajane Jews okay and code is E 78.5. I did call and speak with Christ and gave the code.

## 2015-08-01 DIAGNOSIS — M7661 Achilles tendinitis, right leg: Secondary | ICD-10-CM | POA: Diagnosis not present

## 2015-08-01 DIAGNOSIS — M0579 Rheumatoid arthritis with rheumatoid factor of multiple sites without organ or systems involvement: Secondary | ICD-10-CM | POA: Diagnosis not present

## 2015-08-01 DIAGNOSIS — M2550 Pain in unspecified joint: Secondary | ICD-10-CM | POA: Diagnosis not present

## 2015-08-01 DIAGNOSIS — Z79899 Other long term (current) drug therapy: Secondary | ICD-10-CM | POA: Diagnosis not present

## 2015-09-05 DIAGNOSIS — M0579 Rheumatoid arthritis with rheumatoid factor of multiple sites without organ or systems involvement: Secondary | ICD-10-CM | POA: Diagnosis not present

## 2015-10-04 ENCOUNTER — Encounter: Payer: Self-pay | Admitting: Family Medicine

## 2015-10-04 ENCOUNTER — Ambulatory Visit (INDEPENDENT_AMBULATORY_CARE_PROVIDER_SITE_OTHER): Payer: Medicare Other | Admitting: Family Medicine

## 2015-10-04 VITALS — BP 111/72 | HR 85 | Temp 98.0°F | Ht 74.0 in | Wt 227.0 lb

## 2015-10-04 DIAGNOSIS — M7660 Achilles tendinitis, unspecified leg: Secondary | ICD-10-CM | POA: Diagnosis not present

## 2015-10-04 DIAGNOSIS — I2510 Atherosclerotic heart disease of native coronary artery without angina pectoris: Secondary | ICD-10-CM

## 2015-10-04 DIAGNOSIS — J439 Emphysema, unspecified: Secondary | ICD-10-CM | POA: Diagnosis not present

## 2015-10-04 DIAGNOSIS — K219 Gastro-esophageal reflux disease without esophagitis: Secondary | ICD-10-CM | POA: Diagnosis not present

## 2015-10-04 DIAGNOSIS — E785 Hyperlipidemia, unspecified: Secondary | ICD-10-CM | POA: Diagnosis not present

## 2015-10-04 DIAGNOSIS — M79642 Pain in left hand: Secondary | ICD-10-CM | POA: Diagnosis not present

## 2015-10-04 DIAGNOSIS — M5431 Sciatica, right side: Secondary | ICD-10-CM | POA: Diagnosis not present

## 2015-10-04 DIAGNOSIS — Z23 Encounter for immunization: Secondary | ICD-10-CM | POA: Diagnosis not present

## 2015-10-04 DIAGNOSIS — M7702 Medial epicondylitis, left elbow: Secondary | ICD-10-CM | POA: Diagnosis not present

## 2015-10-04 DIAGNOSIS — Z79899 Other long term (current) drug therapy: Secondary | ICD-10-CM | POA: Diagnosis not present

## 2015-10-04 DIAGNOSIS — M150 Primary generalized (osteo)arthritis: Secondary | ICD-10-CM | POA: Diagnosis not present

## 2015-10-04 DIAGNOSIS — M0609 Rheumatoid arthritis without rheumatoid factor, multiple sites: Secondary | ICD-10-CM

## 2015-10-04 DIAGNOSIS — M79641 Pain in right hand: Secondary | ICD-10-CM | POA: Diagnosis not present

## 2015-10-04 DIAGNOSIS — M0579 Rheumatoid arthritis with rheumatoid factor of multiple sites without organ or systems involvement: Secondary | ICD-10-CM | POA: Diagnosis not present

## 2015-10-04 DIAGNOSIS — M7711 Lateral epicondylitis, right elbow: Secondary | ICD-10-CM | POA: Diagnosis not present

## 2015-10-04 DIAGNOSIS — M2550 Pain in unspecified joint: Secondary | ICD-10-CM | POA: Diagnosis not present

## 2015-10-04 LAB — CBC WITH DIFFERENTIAL/PLATELET
Basophils Absolute: 0 10*3/uL (ref 0.0–0.1)
Basophils Relative: 0.7 % (ref 0.0–3.0)
Eosinophils Absolute: 0.5 10*3/uL (ref 0.0–0.7)
Eosinophils Relative: 10.6 % — ABNORMAL HIGH (ref 0.0–5.0)
HCT: 36.6 % — ABNORMAL LOW (ref 39.0–52.0)
Hemoglobin: 12.4 g/dL — ABNORMAL LOW (ref 13.0–17.0)
Lymphocytes Relative: 23.7 % (ref 12.0–46.0)
Lymphs Abs: 1.1 10*3/uL (ref 0.7–4.0)
MCHC: 33.8 g/dL (ref 30.0–36.0)
MCV: 90.3 fl (ref 78.0–100.0)
Monocytes Absolute: 0.6 10*3/uL (ref 0.1–1.0)
Monocytes Relative: 12.8 % — ABNORMAL HIGH (ref 3.0–12.0)
Neutro Abs: 2.5 10*3/uL (ref 1.4–7.7)
Neutrophils Relative %: 52.2 % (ref 43.0–77.0)
Platelets: 155 10*3/uL (ref 150.0–400.0)
RBC: 4.05 Mil/uL — ABNORMAL LOW (ref 4.22–5.81)
RDW: 15 % (ref 11.5–15.5)
WBC: 4.8 10*3/uL (ref 4.0–10.5)

## 2015-10-04 LAB — LIPID PANEL
Cholesterol: 122 mg/dL (ref 0–200)
HDL: 39.9 mg/dL (ref >39.00)
LDL Cholesterol: 67 mg/dL (ref 0–99)
NonHDL: 82.14
Total CHOL/HDL Ratio: 3
Triglycerides: 77 mg/dL (ref 0.0–149.0)
VLDL: 15.4 mg/dL (ref 0.0–40.0)

## 2015-10-04 LAB — BASIC METABOLIC PANEL
BUN: 25 mg/dL — ABNORMAL HIGH (ref 6–23)
CO2: 26 mEq/L (ref 19–32)
Calcium: 9.5 mg/dL (ref 8.4–10.5)
Chloride: 111 mEq/L (ref 96–112)
Creatinine, Ser: 1.02 mg/dL (ref 0.40–1.50)
GFR: 76.66 mL/min (ref >60.00)
Glucose, Bld: 114 mg/dL — ABNORMAL HIGH (ref 70–99)
Potassium: 4.2 mEq/L (ref 3.5–5.1)
Sodium: 142 mEq/L (ref 135–145)

## 2015-10-04 LAB — HEPATIC FUNCTION PANEL
ALT: 27 U/L (ref 0–53)
AST: 33 U/L (ref 0–37)
Albumin: 4.1 g/dL (ref 3.5–5.2)
Alkaline Phosphatase: 51 U/L (ref 39–117)
Bilirubin, Direct: 0.2 mg/dL (ref 0.0–0.3)
Total Bilirubin: 0.9 mg/dL (ref 0.2–1.2)
Total Protein: 6.4 g/dL (ref 6.0–8.3)

## 2015-10-04 LAB — TSH: TSH: 1.2 u[IU]/mL (ref 0.35–4.50)

## 2015-10-04 MED ORDER — SODIUM CHLORIDE 0.9 % IV SOLN: 10.0000 mg/kg | INTRAVENOUS | Status: DC

## 2015-10-04 MED ORDER — ZOLPIDEM TARTRATE 10 MG PO TABS: 10.0000 mg | ORAL_TABLET | Freq: Every day | ORAL | 5 refills | Status: DC

## 2015-10-04 NOTE — Addendum Note (Signed)
Addended by: Aggie Hacker A on: 10/04/2015 10:47 AM   Modules accepted: Orders

## 2015-10-04 NOTE — Progress Notes (Signed)
   Subjective:    Patient ID: Tyrone Roman, male    DOB: [birth year], 70 y.o.   MRN: [id number]  HPI Here to follow up. He feels fine except for his arthritis pain. He is getting Remicade per Dr. Trudie Reed, and he takes Methotrexate and Meloxicam as well. His BP is stable. He is to see Cardiology next month.    Review of Systems  Constitutional: Negative.   Respiratory: Negative.   Cardiovascular: Negative.   Musculoskeletal: Positive for arthralgias.  Neurological: Negative.        Objective:   Physical Exam  Constitutional: He is oriented to person, place, and time. He appears well-developed and well-nourished.  Neck: Thyromegaly present.  Cardiovascular: Normal rate, regular rhythm, normal heart sounds and intact distal pulses.   Pulmonary/Chest: Effort normal and breath sounds normal.  Lymphadenopathy:    He has no cervical adenopathy.  Neurological: He is alert and oriented to person, place, and time.          Assessment & Plan:  He is doing well from a cardiovascular standpoint. Get fasting labs today to check his lipids, etc. He will see Rheumatology for the RA.  [name], MD

## 2015-10-04 NOTE — Progress Notes (Signed)
Pre visit review using our clinic review tool, if applicable. No additional management support is needed unless otherwise documented below in the visit note. 

## 2015-10-09 ENCOUNTER — Other Ambulatory Visit: Payer: Self-pay | Admitting: Family Medicine

## 2015-10-19 DIAGNOSIS — Z79899 Other long term (current) drug therapy: Secondary | ICD-10-CM | POA: Diagnosis not present

## 2015-10-19 DIAGNOSIS — M0579 Rheumatoid arthritis with rheumatoid factor of multiple sites without organ or systems involvement: Secondary | ICD-10-CM | POA: Diagnosis not present

## 2015-11-09 ENCOUNTER — Other Ambulatory Visit: Payer: Self-pay | Admitting: Family Medicine

## 2015-12-05 DIAGNOSIS — Z79899 Other long term (current) drug therapy: Secondary | ICD-10-CM | POA: Diagnosis not present

## 2015-12-05 DIAGNOSIS — M0579 Rheumatoid arthritis with rheumatoid factor of multiple sites without organ or systems involvement: Secondary | ICD-10-CM | POA: Diagnosis not present

## 2015-12-13 DIAGNOSIS — M720 Palmar fascial fibromatosis [Dupuytren]: Secondary | ICD-10-CM | POA: Diagnosis not present

## 2015-12-13 DIAGNOSIS — M0579 Rheumatoid arthritis with rheumatoid factor of multiple sites without organ or systems involvement: Secondary | ICD-10-CM | POA: Diagnosis not present

## 2015-12-13 DIAGNOSIS — M7660 Achilles tendinitis, unspecified leg: Secondary | ICD-10-CM | POA: Diagnosis not present

## 2015-12-13 DIAGNOSIS — M2550 Pain in unspecified joint: Secondary | ICD-10-CM | POA: Diagnosis not present

## 2015-12-13 DIAGNOSIS — Z79899 Other long term (current) drug therapy: Secondary | ICD-10-CM | POA: Diagnosis not present

## 2015-12-22 NOTE — Progress Notes (Signed)
Patient ID: Tyrone Roman, male   DOB: [birth year], 70 y.o.   MRN: [id number]     Cardiology Office Note   Date:  12/26/2015   ID:  Tyrone Roman, DOB 03-05-1945, MRN [id number]  PCP:  [name], MD  Cardiologist:   Jenkins Rouge, MD   Chief Complaint  Patient presents with  . Shortness of Breath      History of Present Illness: Tyrone Roman is a 70 y.o. male who presents for evaluation of CAD.  Distantly seen by Dr Verl Blalock over 3 years ago His note from 2013 indicates CAD/PCI But no details Review of his Epic Chart indicates 05/2009 had stenting of the proximal LAD by Dr Lia Foyer with left dominant system and no other disease Relook cath by Dr Burt Knack 06/2009 no restenosis  Done for dyspnea thought to be anginal equivalent  Seen by pulmonary for COPD/Asthma and asbestosis Exposure with pleural plaquing by CTA 2012 no PE   07/05/13 ETT normal 9 minutes on [place] HR 151    With SSCP similar to angina before. Has not taken nitro Been going on for 2 months [place] with some numbness and dyspnea  Arthritis acting up Methotrexate not working gets pulsed steroids on occasion Recent depomedrol shot   Past Medical History:  Diagnosis Date  . Barrett's esophageal ulceration   . BPH (benign prostatic hyperplasia)   . CAD (coronary artery disease)    sees Dr. Mar Daring  . Cataract    both eyes  . Colonic polyp   . Concussion with loss of consciousness of 30 minutes or less   . Contact dermatitis   . Contact with or exposure to venereal diseases   . COPD (chronic obstructive pulmonary disease) ([place])    sees Dr. Kara Mead   . Diverticulosis of colon   . Dysphagia   . GERD (gastroesophageal reflux disease)   . Hyperlipidemia   . Hypertension   . Laceration of scalp   . Myocardial infarction    2011- stent placed  . Nephrolithiasis   . Pre-operative cardiovascular examination   . Rheumatoid arthritis [facility])    sees Dr. Gavin Pound   . SOB (shortness of breath)     Past  Surgical History:  Procedure Laterality Date  . COLONOSCOPY  03/01/09   per Dr. Deatra Ina; had benign polyps, repeat 5 years   . EGD with dilatation  02/17/09   Barretts esophagus   . heart stent    . Left knee surgery    . left knee surgery    . TONSILLECTOMY       Current Outpatient Prescriptions  Medication Sig Dispense Refill  . aspirin EC 81 MG tablet Take 81 mg by mouth daily.    . cetirizine (ZYRTEC) 10 MG tablet Take 10 mg by mouth daily.    . folic acid (FOLVITE) 1 MG tablet Take 1 tablet (1 mg total) by mouth daily. 1 tablet 0  . inFLIXimab 1,000 mg in sodium chloride 0.9 % 150 mL Inject 1,000 mg into the vein every 8 (eight) weeks.    . methotrexate 250 MG/10ML injection Injection once weekly    . metoprolol succinate (TOPROL-XL) 25 MG 24 hr tablet TAKE ONE TABLET BY MOUTH ONE TIME DAILY 90 tablet 3  . Multiple Vitamin (MULTIVITAMIN WITH MINERALS) TABS Take 1 tablet by mouth daily.    . nitroGLYCERIN (NITROSTAT) 0.4 MG SL tablet Place 1 tablet (0.4 mg total) under the tongue every 5 (five) minutes as needed  for chest pain (3 doses max). 25 tablet 3  . omeprazole (PRILOSEC) 40 MG capsule TAKE ONE CAPSULE BY MOUTH ONE TIME DAILY 90 capsule 3  . senna-docusate (SB DOCUSATE SODIUM/SENNA) 8.6-50 MG per tablet Take 1 tablet by mouth daily as needed. For constipation    . sildenafil (REVATIO) 20 MG tablet Take 20 mg by mouth daily as needed (for cough & congestion).   3  . simvastatin (ZOCOR) 40 MG tablet TAKE ONE TABLET BY MOUTH EVERY NIGHT AT BEDTIME 90 tablet 3  . tamsulosin (FLOMAX) 0.4 MG CAPS capsule TAKE ONE CAPSULE BY MOUTH DAILY 90 capsule 0  . zolpidem (AMBIEN) 10 MG tablet Take 5 mg by mouth daily.     No current facility-administered medications for this visit.     Allergies:   Cephalexin; Clarithromycin; and Lidoderm    Social History:  The patient  reports that he quit smoking about 27 years ago. His smoking use included Cigarettes. He has a 20.00 pack-year smoking  history. He has never used smokeless tobacco. He reports that he drinks alcohol. He reports that he does not use drugs.   Family History:  The patient's family history includes Arthritis in his father and mother; Cancer in his maternal grandmother; Colon cancer in his father and paternal uncle; Diabetes in his paternal uncle; Heart attack in his father; Prostate cancer in his father; Skin cancer in his daughter.    ROS:  Please see the history of present illness.   Otherwise, review of systems are positive for none.   All other systems are reviewed and negative.    PHYSICAL EXAM: VS:  BP 126/80   Pulse 77   Ht 6\' 2"  (1.88 m)   Wt 106.3 kg (234 lb 6.4 oz)   SpO2 95%   BMI 30.10 kg/m  , BMI Body mass index is 30.1 kg/m. Affect appropriate Healthy:  appears stated age 70: normal Neck supple with no adenopathy JVP normal no bruits no thyromegaly Lungs clear with no wheezing and good diaphragmatic motion Heart:  S1/S2 no murmur, no rub, gallop or click PMI normal Abdomen: benighn, BS positve, no tenderness, no AAA no bruit.  No HSM or HJR Distal pulses intact with no bruits No edema Neuro non-focal Skin warm and dry No muscular weakness    EKG:  02/23/12  SR normal ECG  06/01/14 SR rate 73 normal  12/26/15 Sr rate 73 voltage LVH limb leads.    Recent Labs: 10/04/2015: ALT 27; BUN 25; Creatinine, Ser 1.02; Hemoglobin 12.4; Platelets 155.0; Potassium 4.2; Sodium 142; TSH 1.20    Lipid Panel    Component Value Date/Time   CHOL 122 10/04/2015 0924   TRIG 77.0 10/04/2015 0924   HDL 39.90 10/04/2015 0924   CHOLHDL 3 10/04/2015 0924   VLDL 15.4 10/04/2015 0924   LDLCALC 67 10/04/2015 0924      Wt Readings from Last 3 Encounters:  12/26/15 106.3 kg (234 lb 6.4 oz)  10/04/15 103 kg (227 lb)  05/10/15 104.1 kg (229 lb 6.4 oz)      Other studies Reviewed: Additional studies/ records that were reviewed today include: Cath films 2011 see HPI and Epic  notes.    ASSESSMENT AND PLAN:  1.  CAD: Stenting proximal LAD 2011  Normal ECG  Normal ETT  07/05/2013 Chest pain Will order exercise myovue and have low threshold to cath  2. Chol:  On statin  Lab Results  Component Value Date   LDLCALC 67 10/04/2015  3.  Arthritis  New rheumatologis on [place]   On rheumatrex and methotrexate with pulsed steroids 4. COPD:  Has had flu shot and pneumovax earlier this year          Current medicines are reviewed at length with the patient today.  The patient does not have concerns regarding medicines.  The following changes have been made: SL nitro called in   Labs/ tests ordered today include:  None    Orders Placed This Encounter  Procedures  . Myocardial Perfusion Imaging  . EKG 12-Lead     Disposition:   FU with me next available     Signed, Jenkins Rouge, MD  12/26/2015 8:35 AM    [facility] [address], [place], [place]  [address] Phone: [phone]; Fax: [phone]

## 2015-12-26 ENCOUNTER — Ambulatory Visit (INDEPENDENT_AMBULATORY_CARE_PROVIDER_SITE_OTHER): Payer: Medicare Other | Admitting: Cardiovascular Disease

## 2015-12-26 ENCOUNTER — Telehealth: Payer: Self-pay | Admitting: *Deleted

## 2015-12-26 ENCOUNTER — Encounter: Payer: Self-pay | Admitting: Cardiovascular Disease

## 2015-12-26 VITALS — BP 126/80 | HR 77 | Ht 74.0 in | Wt 234.4 lb

## 2015-12-26 DIAGNOSIS — I2510 Atherosclerotic heart disease of native coronary artery without angina pectoris: Secondary | ICD-10-CM

## 2015-12-26 DIAGNOSIS — R0602 Shortness of breath: Secondary | ICD-10-CM | POA: Diagnosis not present

## 2015-12-26 DIAGNOSIS — R079 Chest pain, unspecified: Secondary | ICD-10-CM

## 2015-12-26 NOTE — Telephone Encounter (Signed)
SPOKE TO PT TO EXPLAIN THE  CORRECT MYOVIEW PROCEDURE WHICH IS AN  EXERCISE MYOVIEW  NOT LEXISCAN WHICH WAS  NOT STATED ON FIRST OFFICE VISIT NOTE GIVEN TO PATIENT.( CORRECTION MADE ON AVS IN EPIC )   ALONG WITH HOLDING METOPROLOL THE MORNING OF STRESS TEST..  PT WAS TOLD CORRECT AVS AND INSTRUCTION SHEET FOR HOLDING MEDICATIONS  WILL BE MAILED TO VERIFIED ADDRESS IN EPIC

## 2015-12-26 NOTE — Patient Instructions (Addendum)
Medication Instructions:   Your physician recommends that you continue on your current medications as directed. Please refer to the Current Medication list given to you today.     If you need a refill on your cardiac medications before your next appointment, please call your pharmacy.  Labwork: NONE ORDERED  TODAY    Testing/Procedures: Your physician has requested that you have en exercise stress myoview. For further information please visit [email or web address]. Please follow instruction sheet, as given.     Follow-Up: NEXT  AVAILABLE WITH DR Alvan Dame    Any Other Special Instructions Will Be Listed Below (If Applicable).

## 2015-12-27 ENCOUNTER — Telehealth (HOSPITAL_COMMUNITY): Payer: Self-pay | Admitting: *Deleted

## 2015-12-27 NOTE — Telephone Encounter (Signed)
Patient given detailed instructions per Myocardial Perfusion Study Information Sheet for the test on 01/03/16 at 0730. Patient notified to arrive 15 minutes early and that it is imperative to arrive on time for appointment to keep from having the test rescheduled.  If you need to cancel or reschedule your appointment, please call the office within 24 hours of your appointment. Failure to do so may result in a cancellation of your appointment, and a $50 no show fee. Patient verbalized understanding.Gelila Well, Ranae Palms

## 2016-01-03 ENCOUNTER — Ambulatory Visit (HOSPITAL_COMMUNITY): Payer: Medicare Other | Attending: Internal Medicine

## 2016-01-03 DIAGNOSIS — I2510 Atherosclerotic heart disease of native coronary artery without angina pectoris: Secondary | ICD-10-CM | POA: Diagnosis not present

## 2016-01-03 DIAGNOSIS — R079 Chest pain, unspecified: Secondary | ICD-10-CM

## 2016-01-03 DIAGNOSIS — R0602 Shortness of breath: Secondary | ICD-10-CM | POA: Insufficient documentation

## 2016-01-03 DIAGNOSIS — I779 Disorder of arteries and arterioles, unspecified: Secondary | ICD-10-CM | POA: Diagnosis not present

## 2016-01-03 DIAGNOSIS — I4510 Unspecified right bundle-branch block: Secondary | ICD-10-CM | POA: Diagnosis not present

## 2016-01-03 DIAGNOSIS — R0789 Other chest pain: Secondary | ICD-10-CM | POA: Insufficient documentation

## 2016-01-03 DIAGNOSIS — Z8249 Family history of ischemic heart disease and other diseases of the circulatory system: Secondary | ICD-10-CM | POA: Diagnosis not present

## 2016-01-03 LAB — MYOCARDIAL PERFUSION IMAGING
LV dias vol: 91 mL (ref 62–150)
LV sys vol: 34 mL
Peak HR: 93 {beats}/min
RATE: 0.42
Rest HR: 64 {beats}/min
SDS: 1
SRS: 5
SSS: 6
TID: 0.97

## 2016-01-03 MED ORDER — REGADENOSON 0.4 MG/5ML IV SOLN
0.4000 mg | Freq: Once | INTRAVENOUS | Status: AC
  Administered 2016-01-03: 0.4 mg via INTRAVENOUS

## 2016-01-03 MED ORDER — TECHNETIUM TC 99M TETROFOSMIN IV KIT
10.2000 | PACK | Freq: Once | INTRAVENOUS | Status: AC | PRN
  Administered 2016-01-03: 10.2 via INTRAVENOUS
  Filled 2016-01-03: qty 11

## 2016-01-03 MED ORDER — TECHNETIUM TC 99M TETROFOSMIN IV KIT
32.3000 | PACK | Freq: Once | INTRAVENOUS | Status: AC | PRN
  Administered 2016-01-03: 32.3 via INTRAVENOUS
  Filled 2016-01-03: qty 33

## 2016-01-04 DIAGNOSIS — M0579 Rheumatoid arthritis with rheumatoid factor of multiple sites without organ or systems involvement: Secondary | ICD-10-CM | POA: Diagnosis not present

## 2016-01-12 ENCOUNTER — Encounter: Payer: Self-pay | Admitting: Cardiovascular Disease

## 2016-01-12 NOTE — Progress Notes (Signed)
Patient ID: Tyrone Roman, male   DOB: Jun 13, 1945, 70 y.o.   MRN: [id number]     Cardiology Office Note   Date:  01/18/2016   ID:  Tyrone Roman, DOB [birth year], MRN [id number]  PCP:  [name], MD  Cardiologist:   Jenkins Rouge, MD   Chief Complaint  Patient presents with  . Chest Pain    no active pain today      History of Present Illness: Tyrone Roman is a 70 y.o. male who presents for evaluation of CAD.  Distantly seen by Dr Verl Blalock over 3 years ago His note from 2013 indicates CAD/PCI But no details Review of his Epic Chart indicates 05/2009 had stenting of the proximal LAD by Dr Lia Foyer with left dominant system and no other disease Relook cath by Dr Burt Knack 06/2009 no restenosis  Done for dyspnea thought to be anginal equivalent  Seen by pulmonary for COPD/Asthma and asbestosis Exposure with pleural plaquing by CTA 2012 no PE   07/05/13 ETT normal 9 minutes on Bruce HR 151   F/U Myovue ordered 01/03/16 normal no ischemia or infarction EF 63%   Arthritis acting up on new iv med that seems to be working better than remicade   Past Medical History:  Diagnosis Date  . Barrett's esophageal ulceration   . BPH (benign prostatic hyperplasia)   . CAD (coronary artery disease)    sees Dr. Mar Daring  . Cataract    both eyes  . Colonic polyp   . Concussion with loss of consciousness of 30 minutes or less   . Contact dermatitis   . Contact with or exposure to venereal diseases   . COPD (chronic obstructive pulmonary disease) ([place])    sees Dr. Kara Mead   . Diverticulosis of colon   . Dysphagia   . GERD (gastroesophageal reflux disease)   . Hyperlipidemia   . Hypertension   . Laceration of scalp   . Myocardial infarction    2011- stent placed  . Nephrolithiasis   . Pre-operative cardiovascular examination   . Rheumatoid arthritis [facility])    sees Dr. Gavin Pound   . SOB (shortness of breath)     Past Surgical History:  Procedure Laterality Date  . COLONOSCOPY   03/01/09   per Dr. Deatra Ina; had benign polyps, repeat 5 years   . EGD with dilatation  02/17/09   Barretts esophagus   . heart stent    . Left knee surgery    . left knee surgery    . TONSILLECTOMY       Current Outpatient Prescriptions  Medication Sig Dispense Refill  . aspirin EC 81 MG tablet Take 81 mg by mouth daily.    . cetirizine (ZYRTEC) 10 MG tablet Take 10 mg by mouth daily.    . folic acid (FOLVITE) 1 MG tablet Take 1 tablet (1 mg total) by mouth daily. 1 tablet 0  . inFLIXimab 1,000 mg in sodium chloride 0.9 % 150 mL Inject 1,000 mg into the vein every 8 (eight) weeks.    . methotrexate 250 MG/10ML injection Injection once weekly    . metoprolol succinate (TOPROL-XL) 25 MG 24 hr tablet TAKE ONE TABLET BY MOUTH ONE TIME DAILY 90 tablet 3  . Multiple Vitamin (MULTIVITAMIN WITH MINERALS) TABS Take 1 tablet by mouth daily.    . nitroGLYCERIN (NITROSTAT) 0.4 MG SL tablet Place 1 tablet (0.4 mg total) under the tongue every 5 (five) minutes as needed for chest pain (3  doses max). 25 tablet 3  . omeprazole (PRILOSEC) 40 MG capsule TAKE ONE CAPSULE BY MOUTH ONE TIME DAILY 90 capsule 3  . senna-docusate (SB DOCUSATE SODIUM/SENNA) 8.6-50 MG per tablet Take 1 tablet by mouth daily as needed. For constipation    . sildenafil (REVATIO) 20 MG tablet Take 20 mg by mouth daily as needed (for cough & congestion).   3  . simvastatin (ZOCOR) 40 MG tablet TAKE ONE TABLET BY MOUTH EVERY NIGHT AT BEDTIME 90 tablet 3  . tamsulosin (FLOMAX) 0.4 MG CAPS capsule TAKE ONE CAPSULE BY MOUTH DAILY 90 capsule 0  . zolpidem (AMBIEN) 10 MG tablet Take 5 mg by mouth daily.     No current facility-administered medications for this visit.     Allergies:   Cephalexin; Clarithromycin; and Lidoderm    Social History:  The patient  reports that he quit smoking about 27 years ago. His smoking use included Cigarettes. He has a 20.00 pack-year smoking history. He has never used smokeless tobacco. He reports that he  drinks alcohol. He reports that he does not use drugs.   Family History:  The patient's family history includes Arthritis in his father and mother; Cancer in his maternal grandmother; Colon cancer in his father and paternal uncle; Diabetes in his paternal uncle; Heart attack in his father; Prostate cancer in his father; Skin cancer in his daughter.    ROS:  Please see the history of present illness.   Otherwise, review of systems are positive for none.   All other systems are reviewed and negative.    PHYSICAL EXAM: VS:  BP 120/62   Pulse 69   Ht 6\' 2"  (1.88 m)   Wt 240 lb 12.8 oz (109.2 kg)   SpO2 95%   BMI 30.92 kg/m  , BMI Body mass index is 30.92 kg/m. Affect appropriate Healthy:  appears stated age 69: normal Neck supple with no adenopathy JVP normal no bruits no thyromegaly Lungs clear with no wheezing and good diaphragmatic motion Heart:  S1/S2 no murmur, no rub, gallop or click PMI normal Abdomen: benighn, BS positve, no tenderness, no AAA no bruit.  No HSM or HJR Distal pulses intact with no bruits No edema Neuro non-focal Skin warm and dry No muscular weakness    EKG:  02/23/12  SR normal ECG  06/01/14 SR rate 73 normal  12/26/15 Sr rate 73 voltage LVH limb leads.    Recent Labs: 10/04/2015: ALT 27; BUN 25; Creatinine, Ser 1.02; Hemoglobin 12.4; Platelets 155.0; Potassium 4.2; Sodium 142; TSH 1.20    Lipid Panel    Component Value Date/Time   CHOL 122 10/04/2015 0924   TRIG 77.0 10/04/2015 0924   HDL 39.90 10/04/2015 0924   CHOLHDL 3 10/04/2015 0924   VLDL 15.4 10/04/2015 0924   LDLCALC 67 10/04/2015 0924      Wt Readings from Last 3 Encounters:  01/18/16 240 lb 12.8 oz (109.2 kg)  01/03/16 234 lb (106.1 kg)  12/26/15 234 lb 6.4 oz (106.3 kg)      Other studies Reviewed: Additional studies/ records that were reviewed today include: Cath films 2011 see HPI and Epic notes.    ASSESSMENT AND PLAN:  1.  CAD: Stenting proximal LAD 2011  Normal  ECG  Normal myovue 01/03/16 Continued symptoms  of exertional chest pain Discussed at length and favor diagnostic cath given known disease. Labs Done orders written cath lab contacted to schedule this week 2. Chol:  On statin  Lab Results  Component Value Date   LDLCALC 67 10/04/2015  3. Arthritis  New rheumatologis on [facility]   On new Iv med every 2 weeks with benefit         Current medicines are reviewed at length with the patient today.  The patient does not have concerns regarding medicines.  The following changes have been made: SL nitro called in   Labs/ tests ordered today include:  None    No orders of the defined types were placed in this encounter.    Disposition:   FU with me next available     Signed, Jenkins Rouge, MD  01/18/2016 8:21 AM    [facility] [place], [place], [place]  [address] Phone: [phone]; Fax: [phone]

## 2016-01-18 ENCOUNTER — Ambulatory Visit (INDEPENDENT_AMBULATORY_CARE_PROVIDER_SITE_OTHER): Payer: Medicare Other | Admitting: Cardiovascular Disease

## 2016-01-18 ENCOUNTER — Encounter: Payer: Self-pay | Admitting: Cardiovascular Disease

## 2016-01-18 ENCOUNTER — Other Ambulatory Visit: Payer: Self-pay | Admitting: Cardiovascular Disease

## 2016-01-18 VITALS — BP 120/62 | HR 69 | Ht 74.0 in | Wt 240.8 lb

## 2016-01-18 DIAGNOSIS — Z79899 Other long term (current) drug therapy: Secondary | ICD-10-CM | POA: Diagnosis not present

## 2016-01-18 DIAGNOSIS — R0789 Other chest pain: Secondary | ICD-10-CM

## 2016-01-18 DIAGNOSIS — I2510 Atherosclerotic heart disease of native coronary artery without angina pectoris: Secondary | ICD-10-CM | POA: Diagnosis not present

## 2016-01-18 DIAGNOSIS — R079 Chest pain, unspecified: Secondary | ICD-10-CM | POA: Diagnosis not present

## 2016-01-18 DIAGNOSIS — Z01812 Encounter for preprocedural laboratory examination: Secondary | ICD-10-CM

## 2016-01-18 DIAGNOSIS — M0579 Rheumatoid arthritis with rheumatoid factor of multiple sites without organ or systems involvement: Secondary | ICD-10-CM | POA: Diagnosis not present

## 2016-01-18 LAB — BASIC METABOLIC PANEL
BUN: 28 mg/dL — ABNORMAL HIGH (ref 7–25)
CO2: 24 mmol/L (ref 20–31)
Calcium: 10.1 mg/dL (ref 8.6–10.3)
Chloride: 106 mmol/L (ref 98–110)
Creat: 1.11 mg/dL (ref 0.70–1.18)
Glucose, Bld: 92 mg/dL (ref 65–99)
Potassium: 4.5 mmol/L (ref 3.5–5.3)
Sodium: 139 mmol/L (ref 135–146)

## 2016-01-18 LAB — CBC WITH DIFFERENTIAL/PLATELET
Basophils Absolute: 90 cells/uL (ref 0–200)
Basophils Relative: 1 %
Eosinophils Absolute: 720 cells/uL — ABNORMAL HIGH (ref 15–500)
Eosinophils Relative: 8 %
HCT: 42.8 % (ref 38.5–50.0)
Hemoglobin: 14.3 g/dL (ref 13.2–17.1)
Lymphocytes Relative: 31 %
Lymphs Abs: 2790 cells/uL (ref 850–3900)
MCH: 29.9 pg (ref 27.0–33.0)
MCHC: 33.4 g/dL (ref 32.0–36.0)
MCV: 89.4 fL (ref 80.0–100.0)
MPV: 13.1 fL — ABNORMAL HIGH (ref 7.5–12.5)
Monocytes Absolute: 990 cells/uL — ABNORMAL HIGH (ref 200–950)
Monocytes Relative: 11 %
Neutro Abs: 4410 cells/uL (ref 1500–7800)
Neutrophils Relative %: 49 %
Platelets: 171 10*3/uL (ref 140–400)
RBC: 4.79 MIL/uL (ref 4.20–5.80)
RDW: 14.4 % (ref 11.0–15.0)
WBC: 9 10*3/uL (ref 3.8–10.8)

## 2016-01-18 LAB — PROTIME-INR
INR: 1
Prothrombin Time: 11 s (ref 9.0–11.5)

## 2016-01-18 NOTE — Patient Instructions (Addendum)
Medication Instructions:  Your physician recommends that you continue on your current medications as directed. Please refer to the Current Medication list given to you today.  Labwork: Your physician recommends that you have lab work today- BMET, CBC, and PT/INR  Testing/Procedures: Your physician has requested that you have a cardiac catheterization. Cardiac catheterization is used to diagnose and/or treat various heart conditions. Doctors may recommend this procedure for a number of different reasons. The most common reason is to evaluate chest pain. Chest pain can be a symptom of coronary artery disease (CAD), and cardiac catheterization can show whether plaque is narrowing or blocking your heart's arteries. This procedure is also used to evaluate the valves, as well as measure the blood flow and oxygen levels in different parts of your heart. For further information please visit [email or web address]. Please follow instruction sheet, as given.  Follow-Up: Your physician wants you to follow-up in: next availabe with Dr. Johnsie Cancel or PA after heart cath.   If you need a refill on your cardiac medications before your next appointment, please call your pharmacy.

## 2016-01-18 NOTE — Addendum Note (Signed)
Addended by: Aris Georgia, Jalil Lorusso L on: 01/18/2016 08:28 AM   Modules accepted: Orders

## 2016-01-19 ENCOUNTER — Ambulatory Visit (HOSPITAL_COMMUNITY)
Admission: RE | Admit: 2016-01-19 | Discharge: 2016-01-19 | Disposition: A | Discharge status: Home / Self Care | Payer: Medicare Other | Source: Ambulatory Visit | Attending: Cardiovascular Disease | Admitting: Cardiovascular Disease

## 2016-01-19 ENCOUNTER — Encounter (HOSPITAL_COMMUNITY)
Admission: RE | Disposition: A | Discharge status: Home / Self Care | Payer: Self-pay | Source: Ambulatory Visit | Attending: Cardiovascular Disease

## 2016-01-19 ENCOUNTER — Encounter (HOSPITAL_COMMUNITY): Payer: Self-pay | Admitting: Cardiovascular Disease

## 2016-01-19 DIAGNOSIS — I10 Essential (primary) hypertension: Secondary | ICD-10-CM | POA: Insufficient documentation

## 2016-01-19 DIAGNOSIS — I252 Old myocardial infarction: Secondary | ICD-10-CM | POA: Diagnosis not present

## 2016-01-19 DIAGNOSIS — J449 Chronic obstructive pulmonary disease, unspecified: Secondary | ICD-10-CM | POA: Diagnosis not present

## 2016-01-19 DIAGNOSIS — N400 Enlarged prostate without lower urinary tract symptoms: Secondary | ICD-10-CM | POA: Insufficient documentation

## 2016-01-19 DIAGNOSIS — R0789 Other chest pain: Secondary | ICD-10-CM

## 2016-01-19 DIAGNOSIS — K219 Gastro-esophageal reflux disease without esophagitis: Secondary | ICD-10-CM | POA: Insufficient documentation

## 2016-01-19 DIAGNOSIS — I25118 Atherosclerotic heart disease of native coronary artery with other forms of angina pectoris: Secondary | ICD-10-CM | POA: Insufficient documentation

## 2016-01-19 DIAGNOSIS — Z7982 Long term (current) use of aspirin: Secondary | ICD-10-CM | POA: Diagnosis not present

## 2016-01-19 DIAGNOSIS — M069 Rheumatoid arthritis, unspecified: Secondary | ICD-10-CM | POA: Diagnosis not present

## 2016-01-19 DIAGNOSIS — Z955 Presence of coronary angioplasty implant and graft: Secondary | ICD-10-CM | POA: Insufficient documentation

## 2016-01-19 DIAGNOSIS — E785 Hyperlipidemia, unspecified: Secondary | ICD-10-CM | POA: Insufficient documentation

## 2016-01-19 DIAGNOSIS — Z87891 Personal history of nicotine dependence: Secondary | ICD-10-CM | POA: Insufficient documentation

## 2016-01-19 HISTORY — PX: CARDIAC CATHETERIZATION: SHX172

## 2016-01-19 SURGERY — LEFT HEART CATH AND CORONARY ANGIOGRAPHY
Anesthesia: LOCAL

## 2016-01-19 MED ORDER — SODIUM CHLORIDE 0.9% FLUSH: 3.0000 mL | INTRAVENOUS | Status: DC | PRN

## 2016-01-19 MED ORDER — SODIUM CHLORIDE 0.9 % IV SOLN: 250.0000 mL | INTRAVENOUS | Status: DC | PRN

## 2016-01-19 MED ORDER — LIDOCAINE HCL (PF) 1 % IJ SOLN
INTRAMUSCULAR | Status: AC
Start: 2016-01-19 — End: 2016-01-19
  Filled 2016-01-19: qty 30

## 2016-01-19 MED ORDER — HEPARIN (PORCINE) IN NACL 2-0.9 UNIT/ML-% IJ SOLN
INTRAMUSCULAR | Status: AC
  Filled 2016-01-19: qty 1000

## 2016-01-19 MED ORDER — SODIUM CHLORIDE 0.9 % IV SOLN: INTRAVENOUS | Status: AC

## 2016-01-19 MED ORDER — VERAPAMIL HCL 2.5 MG/ML IV SOLN
INTRAVENOUS | Status: DC | PRN
  Administered 2016-01-19: 10 mL via INTRA_ARTERIAL

## 2016-01-19 MED ORDER — SODIUM CHLORIDE 0.9 % WEIGHT BASED INFUSION: 1.0000 mL/kg/h | INTRAVENOUS | Status: DC

## 2016-01-19 MED ORDER — MIDAZOLAM HCL 2 MG/2ML IJ SOLN
INTRAMUSCULAR | Status: AC
Start: 2016-01-19 — End: 2016-01-19
  Filled 2016-01-19: qty 2

## 2016-01-19 MED ORDER — SODIUM CHLORIDE 0.9 % WEIGHT BASED INFUSION
3.0000 mL/kg/h | INTRAVENOUS | Status: DC
  Administered 2016-01-19: 3 mL/kg/h via INTRAVENOUS

## 2016-01-19 MED ORDER — HEPARIN SODIUM (PORCINE) 1000 UNIT/ML IJ SOLN
INTRAMUSCULAR | Status: AC
  Filled 2016-01-19: qty 1

## 2016-01-19 MED ORDER — VERAPAMIL HCL 2.5 MG/ML IV SOLN
INTRAVENOUS | Status: AC
  Filled 2016-01-19: qty 2

## 2016-01-19 MED ORDER — IOPAMIDOL (ISOVUE-370) INJECTION 76%
INTRAVENOUS | Status: AC
  Filled 2016-01-19: qty 100

## 2016-01-19 MED ORDER — IOPAMIDOL (ISOVUE-370) INJECTION 76%
INTRAVENOUS | Status: DC | PRN
  Administered 2016-01-19: 80 mL via INTRA_ARTERIAL

## 2016-01-19 MED ORDER — MIDAZOLAM HCL 2 MG/2ML IJ SOLN
INTRAMUSCULAR | Status: DC | PRN
  Administered 2016-01-19: 2 mg via INTRAVENOUS

## 2016-01-19 MED ORDER — HEPARIN (PORCINE) IN NACL 2-0.9 UNIT/ML-% IJ SOLN
INTRAMUSCULAR | Status: DC | PRN
  Administered 2016-01-19: 1000 mL

## 2016-01-19 MED ORDER — FENTANYL CITRATE (PF) 100 MCG/2ML IJ SOLN
INTRAMUSCULAR | Status: DC | PRN
  Administered 2016-01-19: 50 ug via INTRAVENOUS

## 2016-01-19 MED ORDER — SODIUM CHLORIDE 0.9% FLUSH: 3.0000 mL | Freq: Two times a day (BID) | INTRAVENOUS | Status: DC

## 2016-01-19 MED ORDER — ASPIRIN 81 MG PO CHEW: 81.0000 mg | CHEWABLE_TABLET | ORAL | Status: DC

## 2016-01-19 MED ORDER — HEPARIN SODIUM (PORCINE) 1000 UNIT/ML IJ SOLN
INTRAMUSCULAR | Status: DC | PRN
Start: 2016-01-19 — End: 2016-01-19
  Administered 2016-01-19: 5000 [IU] via INTRAVENOUS

## 2016-01-19 MED ORDER — FENTANYL CITRATE (PF) 100 MCG/2ML IJ SOLN
INTRAMUSCULAR | Status: AC
  Filled 2016-01-19: qty 2

## 2016-01-19 SURGICAL SUPPLY — 12 items
CATH EXPO 5F FL3.5 (CATHETERS) ×1 IMPLANT
CATH EXPO 5FR ANG PIGTAIL 145 (CATHETERS) ×1 IMPLANT
CATH INFINITI JR4 5F (CATHETERS) ×1 IMPLANT
DEVICE RAD COMP TR BAND LRG (VASCULAR PRODUCTS) ×1 IMPLANT
GLIDESHEATH SLEND SS 6F .021 (SHEATH) ×1 IMPLANT
GUIDEWIRE INQWIRE 1.5J.035X260 (WIRE) IMPLANT
INQWIRE 1.5J .035X260CM (WIRE) ×2
KIT HEART LEFT (KITS) ×2 IMPLANT
PACK CARDIAC CATHETERIZATION (CUSTOM PROCEDURE TRAY) ×2 IMPLANT
SYR MEDRAD MARK V 150ML (SYRINGE) ×2 IMPLANT
TRANSDUCER W/STOPCOCK (MISCELLANEOUS) ×2 IMPLANT
TUBING CIL FLEX 10 FLL-RA (TUBING) ×2 IMPLANT

## 2016-01-19 NOTE — Discharge Instructions (Signed)

## 2016-01-19 NOTE — Interval H&P Note (Signed)
History and Physical Interval Note:  01/19/2016 7:24 AM  Tyrone Roman  has presented today for cardiac cath with the diagnosis of unstable angina. The various methods of treatment have been discussed with the patient and family. After consideration of risks, benefits and other options for treatment, the patient has consented to  Procedure(s): Left Heart Cath and Coronary Angiography (N/A) as a surgical intervention .  The patient's history has been reviewed, patient examined, no change in status, stable for surgery.  I have reviewed the patient's chart and labs.  Questions were answered to the patient's satisfaction.    Cath Lab Visit (complete for each Cath Lab visit)  Clinical Evaluation Leading to the Procedure:   ACS: No.  Non-ACS:    Anginal Classification: CCS III  Anti-ischemic medical therapy: Minimal Therapy (1 class of medications)  Non-Invasive Test Results: Low-risk stress test findings: cardiac mortality <1%/year  Prior CABG: No previous CABG         Lauree Chandler

## 2016-01-19 NOTE — Progress Notes (Signed)
Dr. Julianne Handler notified of patient stating he may not have anyone to stay the night with him but is working on finding someone to stay with and he does have a ride with a friend of his and has some family that he can stay the most of the evening with.  Patient has no complaints and insist on going home. Patient in stable condition with no site or other complications.  Patient in stable condition and will be discharged as ordered.

## 2016-01-19 NOTE — H&P (View-Only) (Signed)
Patient ID: Tyrone Roman, male   DOB: [birth year], 70 y.o.   MRN: [id number]     Cardiology Office Note   Date:  01/18/2016   ID:  Tyrone Roman, DOB Oct 23, 1945, MRN [id number]  PCP:  [name], MD  Cardiologist:   Jenkins Rouge, MD   Chief Complaint  Patient presents with  . Chest Pain    no active pain today      History of Present Illness: Tyrone Roman is a 70 y.o. male who presents for evaluation of CAD.  Distantly seen by Dr Verl Blalock over 3 years ago His note from 2013 indicates CAD/PCI But no details Review of his Epic Chart indicates 05/2009 had stenting of the proximal LAD by Dr Lia Foyer with left dominant system and no other disease Relook cath by Dr Burt Knack 06/2009 no restenosis  Done for dyspnea thought to be anginal equivalent  Seen by pulmonary for COPD/Asthma and asbestosis Exposure with pleural plaquing by CTA 2012 no PE   07/05/13 ETT normal 9 minutes on Bruce HR 151   F/U Myovue ordered 01/03/16 normal no ischemia or infarction EF 63%   Arthritis acting up on new iv med that seems to be working better than remicade   Past Medical History:  Diagnosis Date  . Barrett's esophageal ulceration   . BPH (benign prostatic hyperplasia)   . CAD (coronary artery disease)    sees Dr. Mar Daring  . Cataract    both eyes  . Colonic polyp   . Concussion with loss of consciousness of 30 minutes or less   . Contact dermatitis   . Contact with or exposure to venereal diseases   . COPD (chronic obstructive pulmonary disease) ([place])    sees Dr. Kara Mead   . Diverticulosis of colon   . Dysphagia   . GERD (gastroesophageal reflux disease)   . Hyperlipidemia   . Hypertension   . Laceration of scalp   . Myocardial infarction    2011- stent placed  . Nephrolithiasis   . Pre-operative cardiovascular examination   . Rheumatoid arthritis [facility])    sees Dr. Gavin Pound   . SOB (shortness of breath)     Past Surgical History:  Procedure Laterality Date  . COLONOSCOPY   03/01/09   per Dr. Deatra Ina; had benign polyps, repeat 5 years   . EGD with dilatation  02/17/09   Barretts esophagus   . heart stent    . Left knee surgery    . left knee surgery    . TONSILLECTOMY       Current Outpatient Prescriptions  Medication Sig Dispense Refill  . aspirin EC 81 MG tablet Take 81 mg by mouth daily.    . cetirizine (ZYRTEC) 10 MG tablet Take 10 mg by mouth daily.    . folic acid (FOLVITE) 1 MG tablet Take 1 tablet (1 mg total) by mouth daily. 1 tablet 0  . inFLIXimab 1,000 mg in sodium chloride 0.9 % 150 mL Inject 1,000 mg into the vein every 8 (eight) weeks.    . methotrexate 250 MG/10ML injection Injection once weekly    . metoprolol succinate (TOPROL-XL) 25 MG 24 hr tablet TAKE ONE TABLET BY MOUTH ONE TIME DAILY 90 tablet 3  . Multiple Vitamin (MULTIVITAMIN WITH MINERALS) TABS Take 1 tablet by mouth daily.    . nitroGLYCERIN (NITROSTAT) 0.4 MG SL tablet Place 1 tablet (0.4 mg total) under the tongue every 5 (five) minutes as needed for chest pain (3  doses max). 25 tablet 3  . omeprazole (PRILOSEC) 40 MG capsule TAKE ONE CAPSULE BY MOUTH ONE TIME DAILY 90 capsule 3  . senna-docusate (SB DOCUSATE SODIUM/SENNA) 8.6-50 MG per tablet Take 1 tablet by mouth daily as needed. For constipation    . sildenafil (REVATIO) 20 MG tablet Take 20 mg by mouth daily as needed (for cough & congestion).   3  . simvastatin (ZOCOR) 40 MG tablet TAKE ONE TABLET BY MOUTH EVERY NIGHT AT BEDTIME 90 tablet 3  . tamsulosin (FLOMAX) 0.4 MG CAPS capsule TAKE ONE CAPSULE BY MOUTH DAILY 90 capsule 0  . zolpidem (AMBIEN) 10 MG tablet Take 5 mg by mouth daily.     No current facility-administered medications for this visit.     Allergies:   Cephalexin; Clarithromycin; and Lidoderm    Social History:  The patient  reports that he quit smoking about 27 years ago. His smoking use included Cigarettes. He has a 20.00 pack-year smoking history. He has never used smokeless tobacco. He reports that he  drinks alcohol. He reports that he does not use drugs.   Family History:  The patient's family history includes Arthritis in his father and mother; Cancer in his maternal grandmother; Colon cancer in his father and paternal uncle; Diabetes in his paternal uncle; Heart attack in his father; Prostate cancer in his father; Skin cancer in his daughter.    ROS:  Please see the history of present illness.   Otherwise, review of systems are positive for none.   All other systems are reviewed and negative.    PHYSICAL EXAM: VS:  BP 120/62   Pulse 69   Ht 6\' 2"  (1.88 m)   Wt 240 lb 12.8 oz (109.2 kg)   SpO2 95%   BMI 30.92 kg/m  , BMI Body mass index is 30.92 kg/m. Affect appropriate Healthy:  appears stated age 76: normal Neck supple with no adenopathy JVP normal no bruits no thyromegaly Lungs clear with no wheezing and good diaphragmatic motion Heart:  S1/S2 no murmur, no rub, gallop or click PMI normal Abdomen: benighn, BS positve, no tenderness, no AAA no bruit.  No HSM or HJR Distal pulses intact with no bruits No edema Neuro non-focal Skin warm and dry No muscular weakness    EKG:  02/23/12  SR normal ECG  06/01/14 SR rate 73 normal  12/26/15 Sr rate 73 voltage LVH limb leads.    Recent Labs: 10/04/2015: ALT 27; BUN 25; Creatinine, Ser 1.02; Hemoglobin 12.4; Platelets 155.0; Potassium 4.2; Sodium 142; TSH 1.20    Lipid Panel    Component Value Date/Time   CHOL 122 10/04/2015 0924   TRIG 77.0 10/04/2015 0924   HDL 39.90 10/04/2015 0924   CHOLHDL 3 10/04/2015 0924   VLDL 15.4 10/04/2015 0924   LDLCALC 67 10/04/2015 0924      Wt Readings from Last 3 Encounters:  01/18/16 240 lb 12.8 oz (109.2 kg)  01/03/16 234 lb (106.1 kg)  12/26/15 234 lb 6.4 oz (106.3 kg)      Other studies Reviewed: Additional studies/ records that were reviewed today include: Cath films 2011 see HPI and Epic notes.    ASSESSMENT AND PLAN:  1.  CAD: Stenting proximal LAD 2011  Normal  ECG  Normal myovue 01/03/16 Continued symptoms  of exertional chest pain Discussed at length and favor diagnostic cath given known disease. Labs Done orders written cath lab contacted to schedule this week 2. Chol:  On statin  Lab Results  Component Value Date   LDLCALC 67 10/04/2015  3. Arthritis  New rheumatologis on [facility]   On new Iv med every 2 weeks with benefit         Current medicines are reviewed at length with the patient today.  The patient does not have concerns regarding medicines.  The following changes have been made: SL nitro called in   Labs/ tests ordered today include:  None    No orders of the defined types were placed in this encounter.    Disposition:   FU with me next available     Signed, Jenkins Rouge, MD  01/18/2016 8:21 AM    [facility] [address], [place], [place]  [address] Phone: [phone]; Fax: [phone]

## 2016-01-24 NOTE — Progress Notes (Signed)
Cardiology Office Note    Date:  01/26/2016   ID:  Tyrone Roman, DOB 10/02/1945, MRN [id number]  PCP:  [name], MD  Cardiologist:  Dr. Johnsie Cancel   CC: post cath follow up  History of Present Illness:  Tyrone Roman is a 69 y.o. male with a history of CAD s/p PCI to pLAD (05/2009), RA, HTN, HLD, COPD, and GERD who presents to clinic for follow up.   He had stenting of the proximal LAD in 05/2009 by Dr Hyman Hopes. Relook cath for dyspnea by Dr Burt Knack in 06/2009 showed no restenosis. Seen by pulmonary for COPD/Asthma and asbestosis exposure.   He was seen by Dr. Johnsie Cancel on 12/26/15 and complained of chest pain. Nuclear stress test on 01/03/16 was normal with no ischemia or infarction EF 63%. He was seen back by Dr. Johnsie Cancel on 01/18/16 and continued to complain of exertional chest pain and was set up for heart cath on 01/19/16. This showed single vessel CAD with patent stent in pRCA with minimal restenosis, mild plaque in non dominant RCA and normal LV function. Continued medical management of CAD was recommended.   Today he presents to clinic for follow up. Still having some chest pain with formal exercise and sometimes going up the steps that eases off with rest. He also feels SOB. No LE edema, orthopnea or PND. Some occasional dizziness or syncope. Sometimes gets tight in his calves with swelling. No blood in stool or urine. Also gets chest pain with sexual intercourse which does bother him. His med list says he takes Revatio PRN for cough and congestion but he doesn't take this often. He denies using this for erectile dysfunction.     Past Medical History:  Diagnosis Date  . Barrett's esophageal ulceration   . BPH (benign prostatic hyperplasia)   . CAD (coronary artery disease)    sees Dr. Mar Daring  . Cataract    both eyes  . Colonic polyp   . Concussion with loss of consciousness of 30 minutes or less   . Contact dermatitis   . Contact with or exposure to venereal diseases   . COPD  (chronic obstructive pulmonary disease) ([place])    sees Dr. Kara Mead   . Diverticulosis of colon   . Dysphagia   . GERD (gastroesophageal reflux disease)   . Hyperlipidemia   . Hypertension   . Laceration of scalp   . Myocardial infarction    2011- stent placed  . Nephrolithiasis   . Pre-operative cardiovascular examination   . Rheumatoid arthritis [facility])    sees Dr. Gavin Pound   . SOB (shortness of breath)     Past Surgical History:  Procedure Laterality Date  . CARDIAC CATHETERIZATION N/A 01/19/2016   Procedure: Left Heart Cath and Coronary Angiography;  Surgeon: Burnell Blanks, MD;  Location: [name] [facility];  Service: Cardiovascular;  Laterality: N/A;  . COLONOSCOPY  03/01/09   per Dr. Deatra Ina; had benign polyps, repeat 5 years   . EGD with dilatation  02/17/09   Barretts esophagus   . heart stent    . Left knee surgery    . left knee surgery    . TONSILLECTOMY      Current Medications: Outpatient Medications Prior to Visit  Medication Sig Dispense Refill  . aspirin EC 81 MG tablet Take 81 mg by mouth daily.    . cetirizine (ZYRTEC) 10 MG tablet Take 10 mg by mouth daily.    . folic acid (  FOLVITE) 1 MG tablet Take 1 tablet (1 mg total) by mouth daily. 1 tablet 0  . methotrexate 250 MG/10ML injection Inject 25 mg into the muscle every Thursday.     . Multiple Vitamin (MULTIVITAMIN WITH MINERALS) TABS Take 1 tablet by mouth daily.    . nitroGLYCERIN (NITROSTAT) 0.4 MG SL tablet Place 1 tablet (0.4 mg total) under the tongue every 5 (five) minutes as needed for chest pain (3 doses max). 25 tablet 3  . senna-docusate (SB DOCUSATE SODIUM/SENNA) 8.6-50 MG per tablet Take 1 tablet by mouth daily as needed for moderate constipation.     . sildenafil (REVATIO) 20 MG tablet Take 20 mg by mouth daily as needed (for cough & congestion).   3  . zolpidem (AMBIEN) 10 MG tablet Take 5 mg by mouth daily.    . metoprolol succinate (TOPROL-XL) 25 MG 24 hr tablet TAKE ONE TABLET  BY MOUTH ONE TIME DAILY (Patient taking differently: TAKE 25 MG BY MOUTH ONE TIME DAILY) 90 tablet 3  . omeprazole (PRILOSEC) 40 MG capsule TAKE ONE CAPSULE BY MOUTH ONE TIME DAILY (Patient taking differently: TAKE 40 MG BY MOUTH ONE TIME DAILY) 90 capsule 3  . simvastatin (ZOCOR) 40 MG tablet TAKE ONE TABLET BY MOUTH EVERY NIGHT AT BEDTIME (Patient taking differently: Take 40 mg by mouth in the evening) 90 tablet 3  . tamsulosin (FLOMAX) 0.4 MG CAPS capsule TAKE ONE CAPSULE BY MOUTH DAILY (Patient taking differently: Take 0.4 mg by mouth daily in the morning) 90 capsule 0   No facility-administered medications prior to visit.      Allergies:   Cephalexin; Clarithromycin; and Lidoderm   Social History   Social History  . Marital status: Divorced    Spouse name: N/A  . Number of children: 2  . Years of education: N/A   Occupational History  . [employer]   Social History Main Topics  . Smoking status: Former Smoker    Packs/day: 1.00    Years: 20.00    Types: Cigarettes    Quit date: 10/12/1988  . Smokeless tobacco: Never Used     Comment: 1960's   . Alcohol use 0.0 oz/week     Comment: rare  . Drug use: No  . Sexual activity: Not on file   Other Topics Concern  . 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      Family History:  The patient's family history includes Arthritis in his father and mother; Cancer in his maternal grandmother; Colon cancer in his father and paternal uncle; Diabetes in his paternal uncle; Heart attack in his father; Prostate cancer in his father; Skin cancer in his daughter.     ROS:   Please see the history of present illness.    ROS All other systems reviewed and are negative.   PHYSICAL EXAM:   VS:  BP 120/85   Pulse 65   Ht 6\' 2"  (1.88 m)   Wt 235 lb 1.9 oz (106.6 kg)   SpO2 98%   BMI 30.19 kg/m    GEN: Well nourished, well developed, in  no acute distress  HEENT: normal  Neck: no JVD, carotid bruits, or masses Cardiac: RRR; no murmurs, rubs, or gallops,no edema  Respiratory:  clear to auscultation bilaterally, normal work of breathing GI: soft, nontender, nondistended, + BS MS: no deformity or atrophy  Skin: warm and dry, no rash  Neuro:  Alert and Oriented x 3, Strength and sensation are intact Psych: euthymic mood, full affect  Wt Readings from Last 3 Encounters:  01/26/16 235 lb 1.9 oz (106.6 kg)  01/19/16 230 lb (104.3 kg)  01/18/16 240 lb 12.8 oz (109.2 kg)      Studies/Labs Reviewed:   EKG:  EKG is NOT ordered today.   Recent Labs: 10/04/2015: ALT 27; TSH 1.20 01/18/2016: BUN 28; Creat 1.11; Hemoglobin 14.3; Platelets 171; Potassium 4.5; Sodium 139   Lipid Panel    Component Value Date/Time   CHOL 122 10/04/2015 0924   TRIG 77.0 10/04/2015 0924   HDL 39.90 10/04/2015 0924   CHOLHDL 3 10/04/2015 0924   VLDL 15.4 10/04/2015 0924   LDLCALC 67 10/04/2015 0924    Additional studies/ records that were reviewed today include:  01/19/16 Procedures  Left Heart Cath and Coronary Angiography  Conclusion    The left ventricular systolic function is normal.  LV end diastolic pressure is normal.  The left ventricular ejection fraction is 50-55% by visual estimate.  There is no mitral valve regurgitation.  Prox RCA lesion, 20 %stenosed.  2nd Mrg lesion, 20 %stenosed.  Prox LAD to Mid LAD lesion, 10 %stenosed.  Mid LAD to Dist LAD lesion, 10 %stenosed.  1. Single vessel CAD with patent stent in the proximal LAD with minimal restenosis 2. Mild plaque in the non-dominant RCA  3. Normal LV systolic function  Recommendations: Continue medical management of CAD.      ASSESSMENT & PLAN:   CAD: stable by cath last week. Continue ASA, statin and BB. Still having exertional chest pain that bothers him. ? Microvascular disease. Will trial imdur 30mg  daily. If he does not tolerate this we could try  Ranexa. He is aware that he cannot take the Revatio within 48 hours of the imdur.  HTN: BP well controlled  HLD: LDL 67 (in 10/2015). Continue statin   COPD: continue follow up with pulm   Medication Adjustments/Labs and Tests Ordered: Current medicines are reviewed at length with the patient today.  Concerns regarding medicines are outlined above.  Medication changes, Labs and Tests ordered today are listed in the Patient Instructions below. Patient Instructions  Medication Instructions:  Your physician has recommended you make the following change in your medication:  1.  START the Imdur 30 mg taking 1 tablet daily  Labwork: None ordered  Testing/Procedures: None ordered  Follow-Up: Your physician recommends that you schedule a follow-up appointment in: 3 MONTHS WITH DR. Johnsie Cancel    Any Other Special Instructions Will Be Listed Below (If Applicable).   If you need a refill on your cardiac medications before your next appointment, please call your pharmacy.      Signed, Angelena Form, PA-C  01/26/2016 1:59 PM    [facility] [place], [place], [place]  [address] Phone: [phone]; Fax: [phone]

## 2016-01-26 ENCOUNTER — Ambulatory Visit (INDEPENDENT_AMBULATORY_CARE_PROVIDER_SITE_OTHER): Payer: Medicare Other | Admitting: Physician Assistant

## 2016-01-26 ENCOUNTER — Encounter (INDEPENDENT_AMBULATORY_CARE_PROVIDER_SITE_OTHER): Payer: Self-pay

## 2016-01-26 VITALS — BP 120/85 | HR 65 | Ht 74.0 in | Wt 235.1 lb

## 2016-01-26 DIAGNOSIS — E785 Hyperlipidemia, unspecified: Secondary | ICD-10-CM

## 2016-01-26 DIAGNOSIS — J449 Chronic obstructive pulmonary disease, unspecified: Secondary | ICD-10-CM | POA: Diagnosis not present

## 2016-01-26 DIAGNOSIS — I10 Essential (primary) hypertension: Secondary | ICD-10-CM

## 2016-01-26 DIAGNOSIS — I2510 Atherosclerotic heart disease of native coronary artery without angina pectoris: Secondary | ICD-10-CM

## 2016-01-26 DIAGNOSIS — I209 Angina pectoris, unspecified: Secondary | ICD-10-CM

## 2016-01-26 DIAGNOSIS — I25119 Atherosclerotic heart disease of native coronary artery with unspecified angina pectoris: Secondary | ICD-10-CM | POA: Diagnosis not present

## 2016-01-26 MED ORDER — ISOSORBIDE MONONITRATE ER 30 MG PO TB24: 30.0000 mg | ORAL_TABLET | Freq: Every day | ORAL | 3 refills | Status: DC

## 2016-01-26 NOTE — Patient Instructions (Addendum)
Medication Instructions:  Your physician has recommended you make the following change in your medication:  1.  START the Imdur 30 mg taking 1 tablet daily  Labwork: None ordered  Testing/Procedures: None ordered  Follow-Up: Your physician recommends that you schedule a follow-up appointment in: 3 MONTHS WITH DR. Johnsie Cancel    Any Other Special Instructions Will Be Listed Below (If Applicable).   If you need a refill on your cardiac medications before your next appointment, please call your pharmacy.

## 2016-01-31 ENCOUNTER — Other Ambulatory Visit: Payer: Self-pay | Admitting: Family Medicine

## 2016-02-01 DIAGNOSIS — M0579 Rheumatoid arthritis with rheumatoid factor of multiple sites without organ or systems involvement: Secondary | ICD-10-CM | POA: Diagnosis not present

## 2016-02-01 NOTE — Telephone Encounter (Signed)
Can we refill this? 

## 2016-02-03 ENCOUNTER — Telehealth: Payer: Self-pay | Admitting: Family Medicine

## 2016-02-03 NOTE — Telephone Encounter (Signed)
Call in #[phone] with 11 rf

## 2016-02-03 NOTE — Telephone Encounter (Signed)
Refill request for Sildenafil 20 mg take 5 po qd prn  Quantity 50 and send to [facility].

## 2016-02-07 MED ORDER — SILDENAFIL CITRATE 20 MG PO TABS: ORAL_TABLET | ORAL | 11 refills | Status: DC

## 2016-02-07 NOTE — Telephone Encounter (Signed)
I sent script e-scribe. 

## 2016-02-13 DIAGNOSIS — E669 Obesity, unspecified: Secondary | ICD-10-CM | POA: Diagnosis not present

## 2016-02-13 DIAGNOSIS — Z6830 Body mass index (BMI) 30.0-30.9, adult: Secondary | ICD-10-CM | POA: Diagnosis not present

## 2016-02-13 DIAGNOSIS — M7660 Achilles tendinitis, unspecified leg: Secondary | ICD-10-CM | POA: Diagnosis not present

## 2016-02-13 DIAGNOSIS — M0579 Rheumatoid arthritis with rheumatoid factor of multiple sites without organ or systems involvement: Secondary | ICD-10-CM | POA: Diagnosis not present

## 2016-02-13 DIAGNOSIS — N522 Drug-induced erectile dysfunction: Secondary | ICD-10-CM | POA: Diagnosis not present

## 2016-02-13 DIAGNOSIS — Z79899 Other long term (current) drug therapy: Secondary | ICD-10-CM | POA: Diagnosis not present

## 2016-02-13 DIAGNOSIS — M2550 Pain in unspecified joint: Secondary | ICD-10-CM | POA: Diagnosis not present

## 2016-02-27 ENCOUNTER — Ambulatory Visit (INDEPENDENT_AMBULATORY_CARE_PROVIDER_SITE_OTHER): Payer: Medicare Other | Admitting: Family Medicine

## 2016-02-27 ENCOUNTER — Encounter: Payer: Self-pay | Admitting: Family Medicine

## 2016-02-27 VITALS — BP 113/77 | HR 96 | Temp 97.8°F | Ht 74.0 in | Wt 239.0 lb

## 2016-02-27 DIAGNOSIS — L03811 Cellulitis of head [any part, except face]: Secondary | ICD-10-CM | POA: Diagnosis not present

## 2016-02-27 MED ORDER — DOXYCYCLINE HYCLATE 100 MG PO CAPS: 100.0000 mg | ORAL_CAPSULE | Freq: Two times a day (BID) | ORAL | 0 refills | Status: AC

## 2016-02-27 NOTE — Progress Notes (Signed)
Pre visit review using our clinic review tool, if applicable. No additional management support is needed unless otherwise documented below in the visit note. 

## 2016-02-27 NOTE — Progress Notes (Signed)
   Subjective:    Patient ID: Tyrone Roman, male    DOB: 27-Jun-1945, 71 y.o.   MRN: [id number]  HPI Here for a possible scalp infection. He has been treating an itchy scaly scalp condition for several years, and he recently tried a shampoo called DHS, which is made of pyrithione zinc 2%. After using this twice last week he developed a burning rash on the scalp which he took to be an allergic reaction. Now for 2 days the scalp has blistered and is oozing straw colored fluid. No fever.    Review of Systems  Constitutional: Negative.   Respiratory: Negative.   Cardiovascular: Negative.   Skin: Positive for rash.       Objective:   Physical Exam  Constitutional: He appears well-developed and well-nourished.  Cardiovascular: Normal rate, regular rhythm, normal heart sounds and intact distal pulses.   Pulmonary/Chest: Effort normal and breath sounds normal.  Lymphadenopathy:    He has no cervical adenopathy.  Skin:  The scalp vertex is red, blistered, warm, and tender           Assessment & Plan:  He has a cellulitis on top of an allergic skin reaction. Treat with Doxycyline for 10 days. Avoid putting anything on the scalp except cool water for one week.  [name], MD

## 2016-03-05 DIAGNOSIS — M0579 Rheumatoid arthritis with rheumatoid factor of multiple sites without organ or systems involvement: Secondary | ICD-10-CM | POA: Diagnosis not present

## 2016-03-22 ENCOUNTER — Telehealth: Payer: Self-pay | Admitting: Family Medicine

## 2016-03-22 NOTE — Telephone Encounter (Signed)
° ° ° °  Pt call to say his scalp condition has not improved and would like a call back

## 2016-03-23 NOTE — Telephone Encounter (Signed)
This is not typical. Have him make another OV to reassess this

## 2016-03-26 NOTE — Telephone Encounter (Signed)
Pt scheduled  

## 2016-03-27 ENCOUNTER — Encounter: Payer: Self-pay | Admitting: Family Medicine

## 2016-03-27 ENCOUNTER — Ambulatory Visit (INDEPENDENT_AMBULATORY_CARE_PROVIDER_SITE_OTHER): Payer: Medicare Other | Admitting: Family Medicine

## 2016-03-27 VITALS — BP 132/70 | HR 81 | Temp 97.5°F | Ht 74.0 in | Wt 240.0 lb

## 2016-03-27 DIAGNOSIS — L409 Psoriasis, unspecified: Secondary | ICD-10-CM | POA: Diagnosis not present

## 2016-03-27 MED ORDER — ABATACEPT 250 MG IV SOLR: 250.0000 mg | INTRAVENOUS | 12 refills | Status: DC

## 2016-03-27 MED ORDER — CLOBETASOL PROPIONATE 0.05 % EX SOLN: 1.0000 "application " | Freq: Two times a day (BID) | CUTANEOUS | 5 refills | Status: DC

## 2016-03-27 NOTE — Progress Notes (Signed)
Pre visit review using our clinic review tool, if applicable. No additional management support is needed unless otherwise documented below in the visit note. 

## 2016-03-27 NOTE — Progress Notes (Signed)
   Subjective:    Patient ID: Tyrone Roman, male    DOB: 08-22-1945, 71 y.o.   MRN: [id number]  HPI Here for continuing scalp irritation. He finished a course of Doxycycline and the redness improved and the draining of fluid stopped. However it is still tender and very itchy.    Review of Systems  Constitutional: Negative.   HENT: Negative.   Respiratory: Negative.   Cardiovascular: Negative.   Skin: Positive for rash.       Objective:   Physical Exam  Constitutional: He appears well-developed and well-nourished.  HENT:  Right Ear: External ear normal.  Left Ear: External ear normal.  Nose: Nose normal.  Mouth/Throat: Oropharynx is clear and moist.  Eyes: Conjunctivae are normal.  Neck: No thyromegaly present.  Pulmonary/Chest: Effort normal and breath sounds normal.  Lymphadenopathy:    He has no cervical adenopathy.  Skin:  The scalp is pink and covered with silver scales, no drainage           Assessment & Plan:  He has psoriasis of the scalp. The cellulitis has cleared but the underlying psoriasis persists. He will use Clobetasol solution to massage into the scalp bid. Recheck prn. [name], MD

## 2016-04-02 DIAGNOSIS — M0579 Rheumatoid arthritis with rheumatoid factor of multiple sites without organ or systems involvement: Secondary | ICD-10-CM | POA: Diagnosis not present

## 2016-04-09 NOTE — Progress Notes (Deleted)
Patient ID: Tyrone Roman, male   DOB: 06/15/1945, 71 y.o.   MRN: [id number]     Cardiology Office Note   Date:  04/09/2016   ID:  Tyrone Roman [birth year], MRN [id number]  PCP:  Tyrone Penna, MD  Cardiologist:   Tyrone Rouge, MD   No chief complaint on file.     History of Present Illness: Tyrone Roman is a 71 y.o. male who presents for evaluation of CAD.  Distantly seen by Dr Tyrone Roman over 3 years ago His note from 2013 indicates CAD/PCI But no details Review of his Epic Chart indicates 05/2009 had stenting of the proximal LAD by Dr Tyrone Roman with left dominant system and no other disease Relook cath by Dr Tyrone Roman 06/2009 no restenosis  Done for dyspnea thought to be anginal equivalent  Seen by pulmonary for COPD/Asthma and asbestosis Exposure with pleural plaquing by [facility]A 2012 no PE   07/05/13 ETT normal 9 minutes on [place] HR 151   F/U Myovue ordered 01/03/16 normal no ischemia or infarction EF 63%   Arthritis acting up on new iv med that seems to be working better than remicade  December had more SSCP cath 01/19/16 reviewed : Patent stent in proximal LADEF normal no other disease  Seen by PA 01/26/16 started on imdur ? Of using revatio on occasion for cough and congestion       Past Medical History:  Diagnosis Date  . Barrett's esophageal ulceration   . BPH (benign prostatic hyperplasia)   . CAD (coronary artery disease)    sees Dr. Mar Roman  . Cataract    both eyes  . Colonic polyp   . Concussion with loss of consciousness of 30 minutes or less   . Contact dermatitis   . Contact with or exposure to venereal diseases   . COPD (chronic obstructive pulmonary disease) ([place])    sees Dr. Kara Roman   . Diverticulosis of colon   . Dysphagia   . GERD (gastroesophageal reflux disease)   . Hyperlipidemia   . Hypertension   . Laceration of scalp   . Myocardial infarction    2011- stent placed  . Nephrolithiasis   . Pre-operative cardiovascular examination   . Rheumatoid  arthritis [facility])    sees Dr. Gavin Roman   . SOB (shortness of breath)     Past Surgical History:  Procedure Laterality Date  . CARDIAC CATHETERIZATION N/A 01/19/2016   Procedure: Left Heart Cath and Coronary Angiography;  Surgeon: Tyrone Blanks, MD;  Location: [facility];  Service: Cardiovascular;  Laterality: N/A;  . COLONOSCOPY  03/01/09   per Dr. Deatra Roman; had benign polyps, repeat 5 years   . EGD with dilatation  02/17/09   Barretts esophagus   . heart stent    . Left knee surgery    . left knee surgery    . TONSILLECTOMY       Current Outpatient Prescriptions  Medication Sig Dispense Refill  . abatacept (ORENCIA) 250 MG injection Inject 250 mg into the vein every 30 (thirty) days. 1 each 12  . aspirin EC 81 MG tablet Take 81 mg by mouth daily.    . cetirizine (ZYRTEC) 10 MG tablet Take 10 mg by mouth daily.    . clobetasol (TEMOVATE) 0.05 % external solution Apply 1 application topically 2 (two) times daily. 50 mL 5  . folic acid (FOLVITE) 1 MG tablet Take 1 tablet (1 mg total) by mouth daily. 1 tablet 0  .  isosorbide mononitrate (IMDUR) 30 MG 24 hr tablet Take 1 tablet (30 mg total) by mouth daily. 90 tablet 3  . methotrexate 250 MG/10ML injection Inject 25 mg into the muscle every Thursday.     . metoprolol succinate (TOPROL-XL) 25 MG 24 hr tablet TAKE 1 TABLET BY MOUTH DAILY 90 tablet 3  . Multiple Vitamin (MULTIVITAMIN WITH MINERALS) TABS Take 1 tablet by mouth daily.    . nitroGLYCERIN (NITROSTAT) 0.4 MG SL tablet Place 1 tablet (0.4 mg total) under the tongue every 5 (five) minutes as needed for chest pain (3 doses max). (Patient not taking: Reported on 02/27/2016) 25 tablet 3  . omeprazole (PRILOSEC) 40 MG capsule Take 40 mg by mouth daily.    Marland Kitchen senna-docusate (SB DOCUSATE SODIUM/SENNA) 8.6-50 MG per tablet Take 1 tablet by mouth daily as needed for moderate constipation.     . sildenafil (REVATIO) 20 MG tablet Take 20 mg by mouth daily as needed 50 tablet  11  . simvastatin (ZOCOR) 40 MG tablet Take 40 mg by mouth daily.    . tamsulosin (FLOMAX) 0.4 MG CAPS capsule Take 0.4 mg by mouth.    . zolpidem (AMBIEN) 10 MG tablet Take 5 mg by mouth daily.     No current facility-administered medications for this visit.     Allergies:   Cephalexin; Clarithromycin; Lidoderm; and Pyrithione zinc    Social History:  The patient  reports that he quit smoking about 27 years ago. His smoking use included Cigarettes. He has a 20.00 pack-year smoking history. He has never used smokeless tobacco. He reports that he drinks alcohol. He reports that he does not use drugs.   Family History:  The patient's family history includes Arthritis in his father and mother; Cancer in his maternal grandmother; Colon cancer in his father and paternal uncle; Diabetes in his paternal uncle; Heart attack in his father; Prostate cancer in his father; Skin cancer in his daughter.    ROS:  Please see the history of present illness.   Otherwise, review of systems are positive for none.   All other systems are reviewed and negative.    PHYSICAL EXAM: VS:  There were no vitals taken for this visit. , BMI There is no height or weight on file to calculate BMI. Affect appropriate Healthy:  appears stated age 64: normal Neck supple with no adenopathy JVP normal no bruits no thyromegaly Lungs clear with no wheezing and good diaphragmatic motion Heart:  S1/S2 no murmur, no rub, gallop or click PMI normal Abdomen: benighn, BS positve, no tenderness, no AAA no bruit.  No HSM or HJR Distal pulses intact with no bruits No edema Neuro non-focal Skin warm and dry No muscular weakness    EKG:  02/23/12  SR normal ECG  06/01/14 SR rate 73 normal  12/26/15 Sr rate 73 voltage LVH limb leads.    Recent Labs: 10/04/2015: ALT 27; TSH 1.20 01/18/2016: BUN 28; Creat 1.11; Hemoglobin 14.3; Platelets 171; Potassium 4.5; Sodium 139    Lipid Panel    Component Value Date/Time   CHOL  122 10/04/2015 0924   TRIG 77.0 10/04/2015 0924   HDL 39.90 10/04/2015 0924   CHOLHDL 3 10/04/2015 0924   VLDL 15.4 10/04/2015 0924   LDLCALC 67 10/04/2015 0924      Wt Readings from Last 3 Encounters:  03/27/16 240 lb (108.9 kg)  02/27/16 239 lb (108.4 kg)  01/26/16 235 lb 1.9 oz (106.6 kg)      Other studies  Reviewed: Additional studies/ records that were reviewed today include: Cath films 2011 see HPI and Epic notes.    ASSESSMENT AND PLAN:  1.  CAD: Stenting proximal LAD 2011  Normal ECG  Normal myovue 01/03/16  Cath 01/19/16 patent no residual disease Continue medical Rx  2. Chol:  On statin  Lab Results  Component Value Date   LDLCALC 67 10/04/2015   3. Arthritis  New rheumatologis on [facility]   On new Iv med every 2 weeks with benefit         Current medicines are reviewed at length with the patient today.  The patient does not have concerns regarding medicines.  The following changes have been made: SL nitro called in   Labs/ tests ordered today include:  None    No orders of the defined types were placed in this encounter.    Disposition:   FU with me  6 months     Signed, Tyrone Rouge, MD  04/09/2016 2:45 PM    [facility] [address], [place], [place]  [address] Phone: [phone]; Fax: [phone]

## 2016-04-13 ENCOUNTER — Other Ambulatory Visit: Payer: Self-pay | Admitting: Family Medicine

## 2016-04-13 NOTE — Telephone Encounter (Signed)
Call in #30 with 5 rf 

## 2016-04-17 ENCOUNTER — Encounter: Payer: Self-pay | Admitting: Cardiovascular Disease

## 2016-04-23 ENCOUNTER — Ambulatory Visit: Payer: Medicare Other | Admitting: Cardiovascular Disease

## 2016-04-29 ENCOUNTER — Other Ambulatory Visit: Payer: Self-pay | Admitting: Family Medicine

## 2016-04-30 DIAGNOSIS — Z79899 Other long term (current) drug therapy: Secondary | ICD-10-CM | POA: Diagnosis not present

## 2016-04-30 DIAGNOSIS — M0579 Rheumatoid arthritis with rheumatoid factor of multiple sites without organ or systems involvement: Secondary | ICD-10-CM | POA: Diagnosis not present

## 2016-05-14 DIAGNOSIS — N521 Erectile dysfunction due to diseases classified elsewhere: Secondary | ICD-10-CM | POA: Diagnosis not present

## 2016-05-14 DIAGNOSIS — Z79899 Other long term (current) drug therapy: Secondary | ICD-10-CM | POA: Diagnosis not present

## 2016-05-14 DIAGNOSIS — E669 Obesity, unspecified: Secondary | ICD-10-CM | POA: Diagnosis not present

## 2016-05-14 DIAGNOSIS — Z6831 Body mass index (BMI) 31.0-31.9, adult: Secondary | ICD-10-CM | POA: Diagnosis not present

## 2016-05-14 DIAGNOSIS — M0579 Rheumatoid arthritis with rheumatoid factor of multiple sites without organ or systems involvement: Secondary | ICD-10-CM | POA: Diagnosis not present

## 2016-05-14 DIAGNOSIS — M2550 Pain in unspecified joint: Secondary | ICD-10-CM | POA: Diagnosis not present

## 2016-05-14 DIAGNOSIS — M7660 Achilles tendinitis, unspecified leg: Secondary | ICD-10-CM | POA: Diagnosis not present

## 2016-05-18 NOTE — Progress Notes (Signed)
Patient ID: Tyrone Roman, male   DOB: April 27, 1945, 71 y.o.   MRN: [id number]     Cardiology Office Note   Date:  05/22/2016   ID:  Tyrone, Roman April 16, 1945, MRN [id number]  PCP:  [name], MD  Cardiologist:   Jenkins Rouge, MD   Chief Complaint  Patient presents with  . Coronary Artery Disease  . HLD      History of Present Illness: [name] is a 71 y.o. male who presents for f/u  of CAD.  Distantly seen by Dr Verl Blalock over 3 years ago His note from 2013 indicates CAD/PCI But no details Review of his Epic Chart indicates 05/2009 had stenting of the proximal LAD by Dr Lia Foyer with left dominant system and no other disease Relook cath by Dr Burt Knack 06/2009 no restenosis  Done for dyspnea thought to be anginal equivalent  Seen by pulmonary for COPD/Asthma and asbestosis Exposure with pleural plaquing by CTA 2012 no PE   07/05/13 ETT normal 9 minutes on [place] HR 151   F/U Myovue ordered 01/03/16 normal no ischemia or infarction EF 63%   Arthritis acting up on new iv med that seems to be working better than remicade  December had more SSCP cath 01/19/16 reviewed : Patent stent in proximal LAD EF normal no other disease  Seen by PA 01/26/16 started on imdur Told not to take within 48 hours of revatio given to him by  Dr Sarajane Jews for ED  Bothered by scalp psoriasis seen by Dr Sarajane Jews advised Clobetasol solution       Past Medical History:  Diagnosis Date  . Barrett's esophageal ulceration   . BPH (benign prostatic hyperplasia)   . CAD (coronary artery disease)    sees Dr. Mar Daring  . Cataract    both eyes  . Colonic polyp   . Concussion with loss of consciousness of 30 minutes or less   . Contact dermatitis   . Contact with or exposure to venereal diseases   . COPD (chronic obstructive pulmonary disease) ([place])    sees Dr. Kara Mead   . Diverticulosis of colon   . Dysphagia   . GERD (gastroesophageal reflux disease)   . Hyperlipidemia   . Hypertension   . Laceration  of scalp   . Myocardial infarction [facility])    2011- stent placed  . Nephrolithiasis   . Pre-operative cardiovascular examination   . Rheumatoid arthritis [facility])    sees Dr. Gavin Pound   . SOB (shortness of breath)     Past Surgical History:  Procedure Laterality Date  . CARDIAC CATHETERIZATION N/A 01/19/2016   Procedure: Left Heart Cath and Coronary Angiography;  Surgeon: Burnell Blanks, MD;  Location: [place] [facility];  Service: Cardiovascular;  Laterality: N/A;  . COLONOSCOPY  03/01/09   per Dr. Deatra Ina; had benign polyps, repeat 5 years   . EGD with dilatation  02/17/09   Barretts esophagus   . heart stent    . Left knee surgery    . left knee surgery    . TONSILLECTOMY       Current Outpatient Prescriptions  Medication Sig Dispense Refill  . abatacept (ORENCIA) 250 MG injection Inject 250 mg into the vein every 30 (thirty) days. 1 each 12  . aspirin EC 81 MG tablet Take 81 mg by mouth daily.    . cetirizine (ZYRTEC) 10 MG tablet Take 10 mg by mouth daily.    . metoprolol succinate (TOPROL-XL) 25  MG 24 hr tablet TAKE 1 TABLET BY MOUTH DAILY 90 tablet 3  . Multiple Vitamin (MULTIVITAMIN WITH MINERALS) TABS Take 1 tablet by mouth daily.    . naproxen (NAPROSYN) 500 MG tablet Take 500 mg by mouth 2 (two) times daily.    . nitroGLYCERIN (NITROSTAT) 0.4 MG SL tablet Place 1 tablet (0.4 mg total) under the tongue every 5 (five) minutes as needed for chest pain (3 doses max). 25 tablet 3  . omeprazole (PRILOSEC) 40 MG capsule Take 40 mg by mouth daily.    Marland Kitchen senna-docusate (SB DOCUSATE SODIUM/SENNA) 8.6-50 MG per tablet Take 1 tablet by mouth daily as needed for moderate constipation.     . sildenafil (REVATIO) 20 MG tablet Take 20 mg by mouth daily as needed 50 tablet 11  . simvastatin (ZOCOR) 40 MG tablet Take 40 mg by mouth daily.    . tamsulosin (FLOMAX) 0.4 MG CAPS capsule TAKE ONE CAPSULE BY MOUTH DAILY 90 capsule 3  . zolpidem (AMBIEN) 10 MG tablet TAKE ONE TABLET BY  MOUTH EVERY NIGHT AT BEDTIME 30 tablet 5  . isosorbide mononitrate (IMDUR) 30 MG 24 hr tablet Take 1 tablet (30 mg total) by mouth daily. 90 tablet 3   No current facility-administered medications for this visit.     Allergies:   Cephalexin; Clarithromycin; Lidoderm; and Pyrithione zinc    Social History:  The patient  reports that he quit smoking about 27 years ago. His smoking use included Cigarettes. He has a 20.00 pack-year smoking history. He has never used smokeless tobacco. He reports that he drinks alcohol. He reports that he does not use drugs.   Family History:  The patient's family history includes Arthritis in his father and mother; Cancer in his maternal grandmother; Colon cancer in his father and paternal uncle; Diabetes in his paternal uncle; Heart attack in his father; Prostate cancer in his father; Skin cancer in his daughter.    ROS:  Please see the history of present illness.   Otherwise, review of systems are positive for none.   All other systems are reviewed and negative.    PHYSICAL EXAM: VS:  BP 126/80   Pulse 74   Ht 6\' 2"  (1.88 m)   Wt 236 lb 1.9 oz (107.1 kg)   BMI 30.32 kg/m  , BMI Body mass index is 30.32 kg/m. Affect appropriate Healthy:  appears stated age 71: Scalp psoriasis  Neck supple with no adenopathy JVP normal no bruits no thyromegaly Lungs clear with no wheezing and good diaphragmatic motion Heart:  S1/S2 no murmur, no rub, gallop or click PMI normal Abdomen: benighn, BS positve, no tenderness, no AAA no bruit.  No HSM or HJR Distal pulses intact with no bruits No edema Neuro non-focal Skin warm and dry No muscular weakness    EKG:  02/23/12  SR normal ECG  06/01/14 SR rate 73 normal  12/26/15 Sr rate 73 voltage LVH limb leads.    Recent Labs: 10/04/2015: ALT 27; TSH 1.20 01/18/2016: BUN 28; Creat 1.11; Hemoglobin 14.3; Platelets 171; Potassium 4.5; Sodium 139    Lipid Panel    Component Value Date/Time   CHOL 122  10/04/2015 0924   TRIG 77.0 10/04/2015 0924   HDL 39.90 10/04/2015 0924   CHOLHDL 3 10/04/2015 0924   VLDL 15.4 10/04/2015 0924   LDLCALC 67 10/04/2015 0924      Wt Readings from Last 3 Encounters:  05/22/16 236 lb 1.9 oz (107.1 kg)  03/27/16 240 lb (108.9  kg)  02/27/16 239 lb (108.4 kg)      Other studies Reviewed: Additional studies/ records that were reviewed today include: Cath films 2011 see HPI and Epic notes.    ASSESSMENT AND PLAN:  1.  CAD: Stenting proximal LAD 2011  Normal ECG  Normal myovue 01/03/16  Cath 01/19/16 patent no residual disease Continue medical Rx  2. Chol:  On statin  Lab Results  Component Value Date   LDLCALC 67 10/04/2015   3. Arthritis  New rheumatologis on [place] every month  with benefit        4. Dyspnea: non cardiac related f/u pulmonary history of COPD ans asbestos exposure   Current medicines are reviewed at length with the patient today.  The patient does not have concerns regarding medicines.  The following changes have been made: SL nitro called in   Labs/ tests ordered today include:  None    No orders of the defined types were placed in this encounter.    Disposition:   FU with me  6 months     Signed, Jenkins Rouge, MD  05/22/2016 8:43 AM    [facility] [address], [place], [place]  [address] Phone: [phone]; Fax: [phone]

## 2016-05-22 ENCOUNTER — Encounter: Payer: Self-pay | Admitting: Cardiovascular Disease

## 2016-05-22 ENCOUNTER — Ambulatory Visit (INDEPENDENT_AMBULATORY_CARE_PROVIDER_SITE_OTHER): Payer: Medicare Other | Admitting: Cardiovascular Disease

## 2016-05-22 ENCOUNTER — Encounter (INDEPENDENT_AMBULATORY_CARE_PROVIDER_SITE_OTHER): Payer: Self-pay

## 2016-05-22 VITALS — BP 126/80 | HR 74 | Ht 74.0 in | Wt 236.1 lb

## 2016-05-22 DIAGNOSIS — I25119 Atherosclerotic heart disease of native coronary artery with unspecified angina pectoris: Secondary | ICD-10-CM

## 2016-05-22 DIAGNOSIS — E785 Hyperlipidemia, unspecified: Secondary | ICD-10-CM

## 2016-05-22 DIAGNOSIS — I209 Angina pectoris, unspecified: Secondary | ICD-10-CM

## 2016-05-22 NOTE — Patient Instructions (Signed)

## 2016-05-28 DIAGNOSIS — M0579 Rheumatoid arthritis with rheumatoid factor of multiple sites without organ or systems involvement: Secondary | ICD-10-CM | POA: Diagnosis not present

## 2016-06-08 DIAGNOSIS — H02834 Dermatochalasis of left upper eyelid: Secondary | ICD-10-CM | POA: Diagnosis not present

## 2016-06-08 DIAGNOSIS — H25812 Combined forms of age-related cataract, left eye: Secondary | ICD-10-CM | POA: Diagnosis not present

## 2016-06-08 DIAGNOSIS — H04123 Dry eye syndrome of bilateral lacrimal glands: Secondary | ICD-10-CM | POA: Diagnosis not present

## 2016-06-08 DIAGNOSIS — H2181 Floppy iris syndrome: Secondary | ICD-10-CM | POA: Diagnosis not present

## 2016-06-08 DIAGNOSIS — Z9889 Other specified postprocedural states: Secondary | ICD-10-CM | POA: Diagnosis not present

## 2016-06-08 DIAGNOSIS — H10413 Chronic giant papillary conjunctivitis, bilateral: Secondary | ICD-10-CM | POA: Diagnosis not present

## 2016-06-08 DIAGNOSIS — H5703 Miosis: Secondary | ICD-10-CM | POA: Diagnosis not present

## 2016-06-08 DIAGNOSIS — H1711 Central corneal opacity, right eye: Secondary | ICD-10-CM | POA: Diagnosis not present

## 2016-06-08 DIAGNOSIS — H02831 Dermatochalasis of right upper eyelid: Secondary | ICD-10-CM | POA: Diagnosis not present

## 2016-06-08 DIAGNOSIS — H2511 Age-related nuclear cataract, right eye: Secondary | ICD-10-CM | POA: Diagnosis not present

## 2016-06-25 DIAGNOSIS — M0579 Rheumatoid arthritis with rheumatoid factor of multiple sites without organ or systems involvement: Secondary | ICD-10-CM | POA: Diagnosis not present

## 2016-07-23 DIAGNOSIS — M0579 Rheumatoid arthritis with rheumatoid factor of multiple sites without organ or systems involvement: Secondary | ICD-10-CM | POA: Diagnosis not present

## 2016-08-20 DIAGNOSIS — M0579 Rheumatoid arthritis with rheumatoid factor of multiple sites without organ or systems involvement: Secondary | ICD-10-CM | POA: Diagnosis not present

## 2016-09-10 DIAGNOSIS — Z79899 Other long term (current) drug therapy: Secondary | ICD-10-CM | POA: Diagnosis not present

## 2016-09-10 DIAGNOSIS — Z6830 Body mass index (BMI) 30.0-30.9, adult: Secondary | ICD-10-CM | POA: Diagnosis not present

## 2016-09-10 DIAGNOSIS — N521 Erectile dysfunction due to diseases classified elsewhere: Secondary | ICD-10-CM | POA: Diagnosis not present

## 2016-09-10 DIAGNOSIS — E669 Obesity, unspecified: Secondary | ICD-10-CM | POA: Diagnosis not present

## 2016-09-10 DIAGNOSIS — M7660 Achilles tendinitis, unspecified leg: Secondary | ICD-10-CM | POA: Diagnosis not present

## 2016-09-10 DIAGNOSIS — M0579 Rheumatoid arthritis with rheumatoid factor of multiple sites without organ or systems involvement: Secondary | ICD-10-CM | POA: Diagnosis not present

## 2016-09-10 DIAGNOSIS — M2550 Pain in unspecified joint: Secondary | ICD-10-CM | POA: Diagnosis not present

## 2016-09-17 DIAGNOSIS — M0579 Rheumatoid arthritis with rheumatoid factor of multiple sites without organ or systems involvement: Secondary | ICD-10-CM | POA: Diagnosis not present

## 2016-10-14 ENCOUNTER — Other Ambulatory Visit: Payer: Self-pay | Admitting: Family Medicine

## 2016-10-15 DIAGNOSIS — M0579 Rheumatoid arthritis with rheumatoid factor of multiple sites without organ or systems involvement: Secondary | ICD-10-CM | POA: Diagnosis not present

## 2016-11-07 DIAGNOSIS — M2550 Pain in unspecified joint: Secondary | ICD-10-CM | POA: Diagnosis not present

## 2016-11-07 DIAGNOSIS — N521 Erectile dysfunction due to diseases classified elsewhere: Secondary | ICD-10-CM | POA: Diagnosis not present

## 2016-11-07 DIAGNOSIS — M7660 Achilles tendinitis, unspecified leg: Secondary | ICD-10-CM | POA: Diagnosis not present

## 2016-11-07 DIAGNOSIS — E663 Overweight: Secondary | ICD-10-CM | POA: Diagnosis not present

## 2016-11-07 DIAGNOSIS — M0579 Rheumatoid arthritis with rheumatoid factor of multiple sites without organ or systems involvement: Secondary | ICD-10-CM | POA: Diagnosis not present

## 2016-11-07 DIAGNOSIS — E785 Hyperlipidemia, unspecified: Secondary | ICD-10-CM | POA: Diagnosis not present

## 2016-11-07 DIAGNOSIS — Z79899 Other long term (current) drug therapy: Secondary | ICD-10-CM | POA: Diagnosis not present

## 2016-11-07 DIAGNOSIS — Z6828 Body mass index (BMI) 28.0-28.9, adult: Secondary | ICD-10-CM | POA: Diagnosis not present

## 2016-11-12 DIAGNOSIS — M0579 Rheumatoid arthritis with rheumatoid factor of multiple sites without organ or systems involvement: Secondary | ICD-10-CM | POA: Diagnosis not present

## 2016-12-05 ENCOUNTER — Encounter: Payer: Self-pay | Admitting: Family Medicine

## 2016-12-05 ENCOUNTER — Ambulatory Visit (INDEPENDENT_AMBULATORY_CARE_PROVIDER_SITE_OTHER): Payer: Medicare Other | Admitting: Family Medicine

## 2016-12-05 VITALS — BP 114/84 | HR 63 | Temp 97.8°F | Ht 74.0 in | Wt 217.0 lb

## 2016-12-05 DIAGNOSIS — E782 Mixed hyperlipidemia: Secondary | ICD-10-CM | POA: Diagnosis not present

## 2016-12-05 DIAGNOSIS — M059 Rheumatoid arthritis with rheumatoid factor, unspecified: Secondary | ICD-10-CM

## 2016-12-05 DIAGNOSIS — Z23 Encounter for immunization: Secondary | ICD-10-CM | POA: Diagnosis not present

## 2016-12-05 DIAGNOSIS — N401 Enlarged prostate with lower urinary tract symptoms: Secondary | ICD-10-CM

## 2016-12-05 DIAGNOSIS — N138 Other obstructive and reflux uropathy: Secondary | ICD-10-CM | POA: Diagnosis not present

## 2016-12-05 DIAGNOSIS — I25119 Atherosclerotic heart disease of native coronary artery with unspecified angina pectoris: Secondary | ICD-10-CM

## 2016-12-05 LAB — LIPID PANEL
Cholesterol: 169 mg/dL (ref 0–200)
HDL: 61.1 mg/dL (ref >39.00)
LDL Cholesterol: 91 mg/dL (ref 0–99)
NonHDL: 108.21
Total CHOL/HDL Ratio: 3
Triglycerides: 87 mg/dL (ref 0.0–149.0)
VLDL: 17.4 mg/dL (ref 0.0–40.0)

## 2016-12-05 LAB — POC URINALSYSI DIPSTICK (AUTOMATED)
Bilirubin, UA: NEGATIVE
Blood, UA: NEGATIVE
Glucose, UA: NEGATIVE
Ketones, UA: NEGATIVE
Leukocytes, UA: NEGATIVE
Nitrite, UA: NEGATIVE
Protein, UA: NEGATIVE
Spec Grav, UA: >=1.03 — AB (ref 1.010–1.025)
Urobilinogen, UA: 0.2 E.U./dL
pH, UA: 5.5 (ref 5.0–8.0)

## 2016-12-05 LAB — CBC WITH DIFFERENTIAL/PLATELET
Basophils Absolute: 0.1 10*3/uL (ref 0.0–0.1)
Basophils Relative: 2.2 % (ref 0.0–3.0)
Eosinophils Absolute: 0.6 10*3/uL (ref 0.0–0.7)
Eosinophils Relative: 9.9 % — ABNORMAL HIGH (ref 0.0–5.0)
HCT: 44.3 % (ref 39.0–52.0)
Hemoglobin: 14.5 g/dL (ref 13.0–17.0)
Lymphocytes Relative: 28.7 % (ref 12.0–46.0)
Lymphs Abs: 1.8 10*3/uL (ref 0.7–4.0)
MCHC: 32.7 g/dL (ref 30.0–36.0)
MCV: 90.8 fl (ref 78.0–100.0)
Monocytes Absolute: 0.7 10*3/uL (ref 0.1–1.0)
Monocytes Relative: 11.6 % (ref 3.0–12.0)
Neutro Abs: 3 10*3/uL (ref 1.4–7.7)
Neutrophils Relative %: 47.6 % (ref 43.0–77.0)
Platelets: 134 10*3/uL — ABNORMAL LOW (ref 150.0–400.0)
RBC: 4.88 Mil/uL (ref 4.22–5.81)
RDW: 14.7 % (ref 11.5–15.5)
WBC: 6.4 10*3/uL (ref 4.0–10.5)

## 2016-12-05 LAB — BASIC METABOLIC PANEL
BUN: 25 mg/dL — ABNORMAL HIGH (ref 6–23)
CO2: 29 mEq/L (ref 19–32)
Calcium: 10.2 mg/dL (ref 8.4–10.5)
Chloride: 106 mEq/L (ref 96–112)
Creatinine, Ser: 0.99 mg/dL (ref 0.40–1.50)
GFR: 79.09 mL/min (ref >60.00)
Glucose, Bld: 105 mg/dL — ABNORMAL HIGH (ref 70–99)
Potassium: 4.5 mEq/L (ref 3.5–5.1)
Sodium: 142 mEq/L (ref 135–145)

## 2016-12-05 LAB — HEPATIC FUNCTION PANEL
ALT: 32 U/L (ref 0–53)
AST: 36 U/L (ref 0–37)
Albumin: 4 g/dL (ref 3.5–5.2)
Alkaline Phosphatase: 39 U/L (ref 39–117)
Bilirubin, Direct: 0.2 mg/dL (ref 0.0–0.3)
Total Bilirubin: 0.9 mg/dL (ref 0.2–1.2)
Total Protein: 6.6 g/dL (ref 6.0–8.3)

## 2016-12-05 LAB — TSH: TSH: 1.67 u[IU]/mL (ref 0.35–4.50)

## 2016-12-05 LAB — PSA: PSA: 1.4 ng/mL (ref 0.10–4.00)

## 2016-12-05 MED ORDER — LEFLUNOMIDE 20 MG PO TABS: 20.0000 mg | ORAL_TABLET | Freq: Two times a day (BID) | ORAL | 0 refills | Status: DC

## 2016-12-05 MED ORDER — ZOLPIDEM TARTRATE 10 MG PO TABS: 10.0000 mg | ORAL_TABLET | Freq: Every day | ORAL | 5 refills | Status: DC

## 2016-12-05 MED ORDER — SIMVASTATIN 40 MG PO TABS: 40.0000 mg | ORAL_TABLET | Freq: Every day | ORAL | 3 refills | Status: DC

## 2016-12-05 NOTE — Progress Notes (Signed)
   Subjective:    Patient ID: Tyrone Roman, male    DOB: 07/10/1945, 71 y.o.   MRN: [id number]  HPI Here to follow up on issues. He feels well. He was recently switched to Cimzia injections for his rheumatoid arthritis and it is a little too early to tell how this will work for him. His BP is stable. His scalp psoriasis cleared up. He saw Dr. Johnsie Cancel in April and he is stable from a cardiac standpoint.    Review of Systems  Constitutional: Negative.   Respiratory: Negative.   Cardiovascular: Negative.   Gastrointestinal: Negative.   Skin: Negative.        Objective:   Physical Exam  Constitutional: He appears well-developed and well-nourished.  Cardiovascular: Normal rate, regular rhythm, normal heart sounds and intact distal pulses.   Pulmonary/Chest: Effort normal and breath sounds normal.  Musculoskeletal: He exhibits no edema.  Skin: No rash noted.          Assessment & Plan:  His CAD and HTN are stable. Get fasting labs today to check lipids, etc. His psoriasis is controlled. He will follow up with rheumatology.  [name], MD

## 2016-12-05 NOTE — Patient Instructions (Signed)
WE NOW OFFER   [facility] FAST TRACK!!!  SAME DAY Appointments for ACUTE CARE  Such as: Sprains, Injuries, cuts, abrasions, rashes, muscle pain, joint pain, back pain Colds, flu, sore throats, headache, allergies, cough, fever  Ear pain, sinus and eye infections Abdominal pain, nausea, vomiting, diarrhea, upset stomach Animal/insect bites  3 Easy Ways to Schedule: Walk-In Scheduling Call in scheduling Mychart Sign-up: [email or web address]         

## 2016-12-10 DIAGNOSIS — M0579 Rheumatoid arthritis with rheumatoid factor of multiple sites without organ or systems involvement: Secondary | ICD-10-CM | POA: Diagnosis not present

## 2017-01-08 DIAGNOSIS — M0579 Rheumatoid arthritis with rheumatoid factor of multiple sites without organ or systems involvement: Secondary | ICD-10-CM | POA: Diagnosis not present

## 2017-02-04 ENCOUNTER — Other Ambulatory Visit: Payer: Self-pay | Admitting: Family Medicine

## 2017-02-11 DIAGNOSIS — Z79899 Other long term (current) drug therapy: Secondary | ICD-10-CM | POA: Diagnosis not present

## 2017-02-11 DIAGNOSIS — M0579 Rheumatoid arthritis with rheumatoid factor of multiple sites without organ or systems involvement: Secondary | ICD-10-CM | POA: Diagnosis not present

## 2017-02-22 ENCOUNTER — Ambulatory Visit (INDEPENDENT_AMBULATORY_CARE_PROVIDER_SITE_OTHER): Payer: Medicare Other | Admitting: Family Medicine

## 2017-02-22 ENCOUNTER — Encounter: Payer: Self-pay | Admitting: Family Medicine

## 2017-02-22 VITALS — BP 120/78 | HR 76 | Temp 97.3°F | Wt 223.2 lb

## 2017-02-22 DIAGNOSIS — L509 Urticaria, unspecified: Secondary | ICD-10-CM | POA: Diagnosis not present

## 2017-02-22 MED ORDER — METHYLPREDNISOLONE 4 MG PO TBPK: ORAL_TABLET | ORAL | 0 refills | Status: DC

## 2017-02-22 MED ORDER — ZOLPIDEM TARTRATE 10 MG PO TABS: 10.0000 mg | ORAL_TABLET | Freq: Every day | ORAL | 5 refills | Status: DC

## 2017-02-22 MED ORDER — SILDENAFIL CITRATE 20 MG PO TABS: ORAL_TABLET | ORAL | 11 refills | Status: DC

## 2017-02-22 NOTE — Progress Notes (Signed)
   Subjective:    Patient ID: Tyrone Roman, male    DOB: 1945-05-10, 72 y.o.   MRN: [id number]  HPI Here for one week of an itchy rash all over the body, including the scalp. No lip swelling or SOB. He was started on Cimzia injections 3 months ago by Dr. Gavin Pound for rheumatoid arthritis. No other medication changes.    Review of Systems  Constitutional: Negative.   Respiratory: Negative.   Cardiovascular: Negative.   Skin: Positive for rash.       Objective:   Physical Exam  Constitutional: He is oriented to person, place, and time. He appears well-developed and well-nourished. No distress.  Cardiovascular: Normal rate, regular rhythm, normal heart sounds and intact distal pulses.  Pulmonary/Chest: Effort normal and breath sounds normal. No respiratory distress. He has no wheezes. He has no rales.  Neurological: He is alert and oriented to person, place, and time.  Skin:  Widespread urticaria           Assessment & Plan:  Hives, likely an allergic reaction to the Cimzia. He will take Zyrtec 10 mg bid and add a Medrol dose pack. He will follow up with Dr. Trudie Reed about this.  [name], MD

## 2017-03-11 ENCOUNTER — Encounter: Payer: Self-pay | Admitting: Family Medicine

## 2017-03-11 ENCOUNTER — Ambulatory Visit (INDEPENDENT_AMBULATORY_CARE_PROVIDER_SITE_OTHER): Payer: Medicare Other | Admitting: Family Medicine

## 2017-03-11 VITALS — BP 112/70 | HR 71 | Temp 97.9°F | Ht 72.5 in | Wt 223.6 lb

## 2017-03-11 DIAGNOSIS — L509 Urticaria, unspecified: Secondary | ICD-10-CM | POA: Diagnosis not present

## 2017-03-11 DIAGNOSIS — M0579 Rheumatoid arthritis with rheumatoid factor of multiple sites without organ or systems involvement: Secondary | ICD-10-CM | POA: Diagnosis not present

## 2017-03-11 DIAGNOSIS — N401 Enlarged prostate with lower urinary tract symptoms: Secondary | ICD-10-CM

## 2017-03-11 DIAGNOSIS — E663 Overweight: Secondary | ICD-10-CM | POA: Diagnosis not present

## 2017-03-11 DIAGNOSIS — R0602 Shortness of breath: Secondary | ICD-10-CM | POA: Diagnosis not present

## 2017-03-11 DIAGNOSIS — N138 Other obstructive and reflux uropathy: Secondary | ICD-10-CM

## 2017-03-11 DIAGNOSIS — M720 Palmar fascial fibromatosis [Dupuytren]: Secondary | ICD-10-CM | POA: Diagnosis not present

## 2017-03-11 DIAGNOSIS — M7660 Achilles tendinitis, unspecified leg: Secondary | ICD-10-CM | POA: Diagnosis not present

## 2017-03-11 DIAGNOSIS — Z6828 Body mass index (BMI) 28.0-28.9, adult: Secondary | ICD-10-CM | POA: Diagnosis not present

## 2017-03-11 DIAGNOSIS — J439 Emphysema, unspecified: Secondary | ICD-10-CM | POA: Diagnosis not present

## 2017-03-11 DIAGNOSIS — M2550 Pain in unspecified joint: Secondary | ICD-10-CM | POA: Diagnosis not present

## 2017-03-11 DIAGNOSIS — Z79899 Other long term (current) drug therapy: Secondary | ICD-10-CM | POA: Diagnosis not present

## 2017-03-11 DIAGNOSIS — E782 Mixed hyperlipidemia: Secondary | ICD-10-CM | POA: Diagnosis not present

## 2017-03-11 NOTE — Progress Notes (Signed)
Subjective:    Patient ID: Tyrone Roman, male    DOB: 06-14-1945, 72 y.o.   MRN: [id number]  HPI Here to follow up on issues. We saw him a few weeks ago for hives which we assumed was an effect of the Cimzia injections he has been getting, and we gave him prednisone and antihistamines. The rash has improved but he still has some itchy spots scattered around. He has an appt with Dr. Trudie Roman later today to discuss this. He also says he has been getting slightly more SOB in the past few months. No coughing. He has known emphysema. The last imaging of his lungs was a CT about 5 years ago. He also mentions some mild sharp pains he gets in the left chest area for a few weeks. These are not related to exertion.    Review of Systems  Constitutional: Negative.   HENT: Negative.   Eyes: Negative.   Respiratory: Positive for shortness of breath. Negative for apnea, cough, choking, chest tightness, wheezing and stridor.   Cardiovascular: Positive for chest pain. Negative for palpitations and leg swelling.  Gastrointestinal: Negative.   Genitourinary: Negative.   Musculoskeletal: Negative.   Skin: Positive for rash.  Neurological: Negative.   Psychiatric/Behavioral: Negative.        Objective:   Physical Exam  Constitutional: He is oriented to person, place, and time. He appears well-developed and well-nourished. No distress.  HENT:  Head: Normocephalic and atraumatic.  Right Ear: External ear normal.  Left Ear: External ear normal.  Nose: Nose normal.  Mouth/Throat: Oropharynx is clear and moist. No oropharyngeal exudate.  Eyes: Conjunctivae and EOM are normal. Pupils are equal, round, and reactive to light. Right eye exhibits no discharge. Left eye exhibits no discharge. No scleral icterus.  Neck: Neck supple. No JVD present. No tracheal deviation present. No thyromegaly present.  Cardiovascular: Normal rate, regular rhythm, normal heart sounds and intact distal pulses. Exam reveals no  gallop and no friction rub.  No murmur heard. Pulmonary/Chest: Effort normal and breath sounds normal. No respiratory distress. He has no wheezes. He has no rales.  He is tender along bilateral sternocostal margins, left more than right   Abdominal: Soft. Bowel sounds are normal. He exhibits no distension and no mass. There is no tenderness. There is no rebound and no guarding.  Genitourinary: Rectum normal, prostate normal and penis normal. Rectal exam shows guaiac negative stool. No penile tenderness.  Musculoskeletal: Normal range of motion. He exhibits no edema or tenderness.  Lymphadenopathy:    He has no cervical adenopathy.  Neurological: He is alert and oriented to person, place, and time. He has normal reflexes. No cranial nerve deficit. He exhibits normal muscle tone. Coordination normal.  Skin: Skin is warm and dry. He is not diaphoretic. No pallor.  Scattered urticaria that are drying up   Psychiatric: He has a normal mood and affect. His behavior is normal. Judgment and thought content normal.          Assessment & Plan:  He has hives which are likely the effect of Cimzia. He will see Dr. Trudie Roman today and I imagine she will switch him to a different treatment for the rheumatoid arthritis. He has costochondritis and I reassured him this is benign. He can use Ibuprofen prn. He has more SOB and the emphysema has likely progressed, we will send him for a CXR today. His HTN is stable. He had complete lab work in October.  [name], MD

## 2017-03-19 DIAGNOSIS — M720 Palmar fascial fibromatosis [Dupuytren]: Secondary | ICD-10-CM | POA: Diagnosis not present

## 2017-03-19 DIAGNOSIS — M0579 Rheumatoid arthritis with rheumatoid factor of multiple sites without organ or systems involvement: Secondary | ICD-10-CM | POA: Diagnosis not present

## 2017-04-16 DIAGNOSIS — Z79899 Other long term (current) drug therapy: Secondary | ICD-10-CM | POA: Diagnosis not present

## 2017-04-16 DIAGNOSIS — M0579 Rheumatoid arthritis with rheumatoid factor of multiple sites without organ or systems involvement: Secondary | ICD-10-CM | POA: Diagnosis not present

## 2017-05-07 ENCOUNTER — Other Ambulatory Visit: Payer: Self-pay | Admitting: Family Medicine

## 2017-05-07 NOTE — Telephone Encounter (Signed)
Last OV 03/11/2017   Last refilled 05/01/2016 disp 90 with 3 refills   Sent to PCP for approval

## 2017-05-14 DIAGNOSIS — M0579 Rheumatoid arthritis with rheumatoid factor of multiple sites without organ or systems involvement: Secondary | ICD-10-CM | POA: Diagnosis not present

## 2017-06-10 DIAGNOSIS — Z79899 Other long term (current) drug therapy: Secondary | ICD-10-CM | POA: Diagnosis not present

## 2017-06-10 DIAGNOSIS — M720 Palmar fascial fibromatosis [Dupuytren]: Secondary | ICD-10-CM | POA: Diagnosis not present

## 2017-06-10 DIAGNOSIS — R682 Dry mouth, unspecified: Secondary | ICD-10-CM | POA: Diagnosis not present

## 2017-06-10 DIAGNOSIS — M2550 Pain in unspecified joint: Secondary | ICD-10-CM | POA: Diagnosis not present

## 2017-06-10 DIAGNOSIS — E663 Overweight: Secondary | ICD-10-CM | POA: Diagnosis not present

## 2017-06-10 DIAGNOSIS — M7660 Achilles tendinitis, unspecified leg: Secondary | ICD-10-CM | POA: Diagnosis not present

## 2017-06-10 DIAGNOSIS — Z6828 Body mass index (BMI) 28.0-28.9, adult: Secondary | ICD-10-CM | POA: Diagnosis not present

## 2017-06-10 DIAGNOSIS — M0579 Rheumatoid arthritis with rheumatoid factor of multiple sites without organ or systems involvement: Secondary | ICD-10-CM | POA: Diagnosis not present

## 2017-06-11 DIAGNOSIS — R682 Dry mouth, unspecified: Secondary | ICD-10-CM | POA: Diagnosis not present

## 2017-06-11 DIAGNOSIS — M0579 Rheumatoid arthritis with rheumatoid factor of multiple sites without organ or systems involvement: Secondary | ICD-10-CM | POA: Diagnosis not present

## 2017-07-08 ENCOUNTER — Ambulatory Visit (INDEPENDENT_AMBULATORY_CARE_PROVIDER_SITE_OTHER)
Admission: RE | Admit: 2017-07-08 | Discharge: 2017-07-08 | Disposition: A | Discharge status: Home / Self Care | Payer: Medicare Other | Source: Ambulatory Visit | Attending: Family Medicine | Admitting: Family Medicine

## 2017-07-08 ENCOUNTER — Encounter: Payer: Self-pay | Admitting: Family Medicine

## 2017-07-08 ENCOUNTER — Ambulatory Visit: Payer: Self-pay

## 2017-07-08 ENCOUNTER — Ambulatory Visit (INDEPENDENT_AMBULATORY_CARE_PROVIDER_SITE_OTHER): Payer: Medicare Other | Admitting: Family Medicine

## 2017-07-08 VITALS — BP 100/70 | HR 88 | Temp 97.9°F | Ht 72.5 in | Wt 222.5 lb

## 2017-07-08 DIAGNOSIS — L723 Sebaceous cyst: Secondary | ICD-10-CM | POA: Diagnosis not present

## 2017-07-08 DIAGNOSIS — R0602 Shortness of breath: Secondary | ICD-10-CM | POA: Diagnosis not present

## 2017-07-08 DIAGNOSIS — J439 Emphysema, unspecified: Secondary | ICD-10-CM

## 2017-07-08 NOTE — Progress Notes (Signed)
   Subjective:    Patient ID: Tyrone Roman, male    DOB: 16-Sep-1945, 72 y.o.   MRN: [id number]  HPI Here to check a lump on the scrotum he discovered a few days ago. This does not bother him.    Review of Systems  Constitutional: Negative.   Respiratory: Negative.   Cardiovascular: Negative.   Genitourinary: Negative.        Objective:   Physical Exam  Constitutional: He appears well-developed and well-nourished.  Cardiovascular: Normal rate, regular rhythm, normal heart sounds and intact distal pulses.  Pulmonary/Chest: Effort normal and breath sounds normal.  Genitourinary:  Genitourinary Comments: The inferior scrotum has a small firm non-tender cystic lump just under the skin           Assessment & Plan:  Sebaceous cyst. This is benign and he will observe it only.  [name], MD

## 2017-07-08 NOTE — Telephone Encounter (Signed)
  Reason for Disposition . [1] Small swelling or lump AND [2] unexplained AND [3] present > 1 week  Answer Assessment - Initial Assessment Questions 1. LOCATION and RADIATION: "Where is the pain located?"      No pain 2. QUALITY: "What does the pain feel like?"  (e.g., sharp, dull, aching, burning)     n/a 3. SEVERITY: "How bad is the pain?"  (Scale 1-10; or mild, moderate, severe)   - MILD (1-3): doesn't interfere with normal activities    - MODERATE (4-7): interferes with normal activities (e.g., work or school) or awakens from sleep   - SEVERE (8-10): excruciating pain, unable to do any normal activities, difficulty walking     n/a 4. ONSET: "When did the pain start?"     n/a 5. PATTERN: "Does it come and go, or has it been constant since it started?"     n/a 6. SCROTAL APPEARANCE: "What does the scrotum look like?" "Is there any swelling or redness?"      Appears normal-centered lower scrotal lump 7. HERNIA: "Has a doctor ever told you that you have a hernia?"     n/a 8. OTHER SYMPTOMS: "Do you have any other symptoms?" (e.g., fever, abdominal pain, vomiting, difficulty passing urine)     Abdominal pain, difficulty passing urine  Answer Assessment - Initial Assessment Questions 1. APPEARANCE of SWELLING: "What does it look like?" (e.g., lymph node, insect bite, mole)     No swelling noted 3. LOCATION: "Where is the swelling located?" Lower mid scrotal area 4. ONSET: "When did the swelling start?"   No swelling - lump appeared Saturday 5. PAIN: "Is it painful?" If so, ask: "How much?"     no 6. ITCH: "Does it itch?" If so, ask: "How much?"     no 7. CAUSE: "What do you think caused the swelling?"     unknown 8. OTHER SYMPTOMS: "Do you have any other symptoms?" (e.g., fever)     Abdominal pain and difficulty passing urine.  Protocols used: SKIN LUMP OR LOCALIZED SWELLING-A-AH, SCROTAL PAIN-A-AH

## 2017-07-08 NOTE — Telephone Encounter (Signed)
Pt c/o palpating a small lump to lower mid scrotum. No pain at site or swelling. Pt states he has had difficulty passing urine and abd pain. Pt states the scrotum appears normal in size. Pt given care advice- appt made for today with PCP.

## 2017-07-11 DIAGNOSIS — M0579 Rheumatoid arthritis with rheumatoid factor of multiple sites without organ or systems involvement: Secondary | ICD-10-CM | POA: Diagnosis not present

## 2017-08-12 DIAGNOSIS — Z79899 Other long term (current) drug therapy: Secondary | ICD-10-CM | POA: Diagnosis not present

## 2017-08-12 DIAGNOSIS — M0579 Rheumatoid arthritis with rheumatoid factor of multiple sites without organ or systems involvement: Secondary | ICD-10-CM | POA: Diagnosis not present

## 2017-08-20 DIAGNOSIS — E663 Overweight: Secondary | ICD-10-CM | POA: Diagnosis not present

## 2017-08-20 DIAGNOSIS — R21 Rash and other nonspecific skin eruption: Secondary | ICD-10-CM | POA: Diagnosis not present

## 2017-08-20 DIAGNOSIS — M7660 Achilles tendinitis, unspecified leg: Secondary | ICD-10-CM | POA: Diagnosis not present

## 2017-08-20 DIAGNOSIS — Z6828 Body mass index (BMI) 28.0-28.9, adult: Secondary | ICD-10-CM | POA: Diagnosis not present

## 2017-08-20 DIAGNOSIS — M150 Primary generalized (osteo)arthritis: Secondary | ICD-10-CM | POA: Diagnosis not present

## 2017-08-20 DIAGNOSIS — M2550 Pain in unspecified joint: Secondary | ICD-10-CM | POA: Diagnosis not present

## 2017-08-20 DIAGNOSIS — M0579 Rheumatoid arthritis with rheumatoid factor of multiple sites without organ or systems involvement: Secondary | ICD-10-CM | POA: Diagnosis not present

## 2017-09-05 ENCOUNTER — Telehealth: Payer: Self-pay | Admitting: Family Medicine

## 2017-09-05 NOTE — Telephone Encounter (Signed)
Dr. Fry please advise 

## 2017-09-05 NOTE — Telephone Encounter (Signed)
Copied from [place] [id number]. Topic: Referral - Request >> Sep 05, 2017  1:49 PM Percell Belt A wrote: Reason for CRM:  Pt called in and would like to know who Dr Sarajane Jews would recommend for a Optometrist.  He may need Cataracts Surgery and would like Dr Sarajane Jews opinion?    Best number is [phone]

## 2017-09-10 NOTE — Telephone Encounter (Signed)
Called and left a detailed message on verified voice mail. Patient was given the office phone number for Dr. Herbert Deaner.

## 2017-09-10 NOTE — Telephone Encounter (Signed)
He should see Dr. Monna Fam

## 2017-09-24 DIAGNOSIS — M0579 Rheumatoid arthritis with rheumatoid factor of multiple sites without organ or systems involvement: Secondary | ICD-10-CM | POA: Diagnosis not present

## 2017-10-06 ENCOUNTER — Encounter (HOSPITAL_COMMUNITY): Payer: Self-pay | Admitting: Emergency Medicine

## 2017-10-06 ENCOUNTER — Emergency Department (HOSPITAL_COMMUNITY): Payer: Medicare Other

## 2017-10-06 ENCOUNTER — Emergency Department (HOSPITAL_COMMUNITY)
Admission: EM | Admit: 2017-10-06 | Discharge: 2017-10-06 | Disposition: A | Discharge status: Home / Self Care | Payer: Medicare Other | Source: Home / Self Care | Attending: Emergency Medicine | Admitting: Emergency Medicine

## 2017-10-06 DIAGNOSIS — S39012A Strain of muscle, fascia and tendon of lower back, initial encounter: Secondary | ICD-10-CM | POA: Insufficient documentation

## 2017-10-06 DIAGNOSIS — I10 Essential (primary) hypertension: Secondary | ICD-10-CM

## 2017-10-06 DIAGNOSIS — T148XXA Other injury of unspecified body region, initial encounter: Secondary | ICD-10-CM

## 2017-10-06 DIAGNOSIS — J449 Chronic obstructive pulmonary disease, unspecified: Secondary | ICD-10-CM

## 2017-10-06 DIAGNOSIS — Z79899 Other long term (current) drug therapy: Secondary | ICD-10-CM

## 2017-10-06 DIAGNOSIS — Y929 Unspecified place or not applicable: Secondary | ICD-10-CM | POA: Insufficient documentation

## 2017-10-06 DIAGNOSIS — Z87891 Personal history of nicotine dependence: Secondary | ICD-10-CM | POA: Insufficient documentation

## 2017-10-06 DIAGNOSIS — M5106 Intervertebral disc disorders with myelopathy, lumbar region: Secondary | ICD-10-CM | POA: Diagnosis not present

## 2017-10-06 DIAGNOSIS — M5116 Intervertebral disc disorders with radiculopathy, lumbar region: Secondary | ICD-10-CM | POA: Diagnosis not present

## 2017-10-06 DIAGNOSIS — Y999 Unspecified external cause status: Secondary | ICD-10-CM | POA: Insufficient documentation

## 2017-10-06 DIAGNOSIS — M5441 Lumbago with sciatica, right side: Secondary | ICD-10-CM | POA: Insufficient documentation

## 2017-10-06 DIAGNOSIS — I252 Old myocardial infarction: Secondary | ICD-10-CM

## 2017-10-06 DIAGNOSIS — X500XXA Overexertion from strenuous movement or load, initial encounter: Secondary | ICD-10-CM

## 2017-10-06 DIAGNOSIS — N200 Calculus of kidney: Secondary | ICD-10-CM | POA: Diagnosis not present

## 2017-10-06 DIAGNOSIS — Z7982 Long term (current) use of aspirin: Secondary | ICD-10-CM

## 2017-10-06 DIAGNOSIS — Y9389 Activity, other specified: Secondary | ICD-10-CM

## 2017-10-06 DIAGNOSIS — E785 Hyperlipidemia, unspecified: Secondary | ICD-10-CM | POA: Diagnosis not present

## 2017-10-06 DIAGNOSIS — I2510 Atherosclerotic heart disease of native coronary artery without angina pectoris: Secondary | ICD-10-CM

## 2017-10-06 DIAGNOSIS — M545 Low back pain: Secondary | ICD-10-CM | POA: Diagnosis not present

## 2017-10-06 DIAGNOSIS — R1031 Right lower quadrant pain: Secondary | ICD-10-CM | POA: Diagnosis not present

## 2017-10-06 DIAGNOSIS — M5126 Other intervertebral disc displacement, lumbar region: Secondary | ICD-10-CM | POA: Diagnosis not present

## 2017-10-06 LAB — CBC WITH DIFFERENTIAL/PLATELET
Basophils Absolute: 0.1 10*3/uL (ref 0.0–0.1)
Basophils Relative: 1 %
Eosinophils Absolute: 0.5 10*3/uL (ref 0.0–0.7)
Eosinophils Relative: 8 %
HCT: 39.4 % (ref 39.0–52.0)
Hemoglobin: 13.5 g/dL (ref 13.0–17.0)
Lymphocytes Relative: 11 %
Lymphs Abs: 0.8 10*3/uL (ref 0.7–4.0)
MCH: 30.3 pg (ref 26.0–34.0)
MCHC: 34.3 g/dL (ref 30.0–36.0)
MCV: 88.5 fL (ref 78.0–100.0)
Monocytes Absolute: 0.9 10*3/uL (ref 0.1–1.0)
Monocytes Relative: 13 %
Neutro Abs: 4.7 10*3/uL (ref 1.7–7.7)
Neutrophils Relative %: 67 %
Platelets: 155 10*3/uL (ref 150–400)
RBC: 4.45 MIL/uL (ref 4.22–5.81)
RDW: 13.7 % (ref 11.5–15.5)
WBC: 6.9 10*3/uL (ref 4.0–10.5)

## 2017-10-06 LAB — BASIC METABOLIC PANEL
Anion gap: 8 (ref 5–15)
BUN: 17 mg/dL (ref 8–23)
CO2: 24 mmol/L (ref 22–32)
Calcium: 9.6 mg/dL (ref 8.9–10.3)
Chloride: 108 mmol/L (ref 98–111)
Creatinine, Ser: 0.97 mg/dL (ref 0.61–1.24)
GFR calc Af Amer: >60 mL/min (ref >60)
GFR calc non Af Amer: >60 mL/min (ref >60)
Glucose, Bld: 108 mg/dL — ABNORMAL HIGH (ref 70–99)
Potassium: 4 mmol/L (ref 3.5–5.1)
Sodium: 140 mmol/L (ref 135–145)

## 2017-10-06 LAB — URINALYSIS, ROUTINE W REFLEX MICROSCOPIC
Glucose, UA: NEGATIVE mg/dL
Hgb urine dipstick: NEGATIVE
Ketones, ur: NEGATIVE mg/dL
Leukocytes, UA: NEGATIVE
Nitrite: NEGATIVE
Protein, ur: NEGATIVE mg/dL
Specific Gravity, Urine: 1.015 (ref 1.005–1.030)
pH: 7 (ref 5.0–8.0)

## 2017-10-06 MED ORDER — CYCLOBENZAPRINE HCL 10 MG PO TABS: 5.0000 mg | ORAL_TABLET | Freq: Once | ORAL | Status: DC

## 2017-10-06 MED ORDER — IOPAMIDOL (ISOVUE-300) INJECTION 61%
INTRAVENOUS | Status: AC
  Filled 2017-10-06: qty 100

## 2017-10-06 MED ORDER — CYCLOBENZAPRINE HCL 10 MG PO TABS: 10.0000 mg | ORAL_TABLET | Freq: Two times a day (BID) | ORAL | 0 refills | Status: DC | PRN

## 2017-10-06 MED ORDER — FENTANYL CITRATE (PF) 100 MCG/2ML IJ SOLN
50.0000 ug | Freq: Once | INTRAMUSCULAR | Status: AC
  Administered 2017-10-06: 50 ug via INTRAVENOUS
  Filled 2017-10-06: qty 2

## 2017-10-06 MED ORDER — CYCLOBENZAPRINE HCL 10 MG PO TABS
10.0000 mg | ORAL_TABLET | Freq: Once | ORAL | Status: DC
  Filled 2017-10-06: qty 1

## 2017-10-06 MED ORDER — IOPAMIDOL (ISOVUE-370) INJECTION 76%
100.0000 mL | Freq: Once | INTRAVENOUS | Status: AC | PRN
  Administered 2017-10-06: 100 mL via INTRAVENOUS

## 2017-10-06 MED ORDER — MORPHINE SULFATE (PF) 4 MG/ML IV SOLN
4.0000 mg | Freq: Once | INTRAVENOUS | Status: AC
  Administered 2017-10-06: 4 mg via INTRAVENOUS
  Filled 2017-10-06: qty 1

## 2017-10-06 MED ORDER — DICLOFENAC SODIUM 1 % TD GEL: 2.0000 g | Freq: Four times a day (QID) | TRANSDERMAL | 0 refills | Status: DC

## 2017-10-06 MED ORDER — IOPAMIDOL (ISOVUE-370) INJECTION 76%
INTRAVENOUS | Status: AC
  Filled 2017-10-06: qty 100

## 2017-10-06 NOTE — Discharge Instructions (Signed)
You can take 1000 mg of Tylenol.  Do not exceed 4000 mg of Tylenol a day.  Take Flexeril as prescribed. This medication will make you drowsy so do not drive or drink alcohol when taking it.  Use Voltaren gel as directed.  As we discussed, you can apply heat to affected area to help with pain.  Follow-up with your primary care doctor in the next 2 to 3 days for further evaluation.  Return to the Emergency Department immediately for any worsening back pain, neck pain, difficulty walking, numbness/weaknss of your arms or legs, urinary or bowel accidents, rash,  fever or any other worsening or concerning symptoms.

## 2017-10-06 NOTE — ED Triage Notes (Signed)
Pt with flank /  back pain x 1 week, pt states pain today has worsened and pain / tingling, burning and numbness to R leg. Pt states he injured back while lifting heavy chair about 1 week ago, but pt also states he has a hx of kidney stone and pt states it feels similar to when he had kidney stone. Pt states urinary frequency and scant amounts with nausea.

## 2017-10-06 NOTE — ED Provider Notes (Signed)
[facility] DEPT Provider Note   CSN: [id number] Arrival date & time: 10/06/17  1642     History   Chief Complaint Chief Complaint  Patient presents with  . Back Pain  . Flank Pain    HPI Tyrone Roman is a 72 y.o. male with PMH/o BPH, CAD, COPD, HTN who presents for evaluation of 1 week of back pain and flank pain that worsened today. Patient reports that today he has had some numbness/tingling to his right lower leg and groin area. He still has sensation but feels a tingling in that area.  He states that the tingling and burning is worse when he urinates but he does not have any dysuria.  He has not noticed any hematuria.  Patient states that his back pain began a week ago after he helped somebody lift a barber chair up into the truck.  He states that he did not supportive at home care measures, including heating pad.  He states that initially, he noticed some erythema to the back but states that has improved.  He has not had any rash to the back or leg.  Patient states that he has not had any new trauma, fall, injury.  He is still been able to ambulate but with worsening pain.  He is also noticed some nausea but no vomiting.  He took Etodolac for pain with no improvement.  Patient does report a history of kidney stones and states that this feels similar conditions.  Patient denies any fever, difficulty breathing, chest pain, urinary or bowel incontinence, saddle anesthesia.  The history is provided by the patient.    Past Medical History:  Diagnosis Date  . Barrett's esophageal ulceration   . BPH (benign prostatic hyperplasia)   . CAD (coronary artery disease)    sees Dr. Mar Daring  . Cataract    both eyes  . Colonic polyp   . Concussion with loss of consciousness of 30 minutes or less   . Contact dermatitis   . Contact with or exposure to venereal diseases   . COPD (chronic obstructive pulmonary disease) ([place])    sees Dr. Kara Mead   .  Diverticulosis of colon   . Dysphagia   . GERD (gastroesophageal reflux disease)   . Hyperlipidemia   . Hypertension   . Laceration of scalp   . Myocardial infarction [facility])    2011- stent placed  . Nephrolithiasis   . Pre-operative cardiovascular examination   . Rheumatoid arthritis [facility])    sees Dr. Gavin Pound   . SOB (shortness of breath)     Patient Active Problem List   Diagnosis Date Noted  . Psoriasis of scalp 03/27/2016  . Rheumatoid arthritis ([place]) 03/25/2013  . GI bleeding 02/20/2012  . Dyspnea on exertion 09/08/2010  . COPD (chronic obstructive pulmonary disease) ([place]) 08/04/2010  . Bruising 08/04/2010  . SYNCOPE 02/01/2010  . HEADACHE 01/04/2010  . TINNITUS 12/16/2009  . Dizziness and giddiness 12/16/2009  . NECK SPRAIN AND STRAIN 10/21/2009  . LUMBAR SPRAIN AND STRAIN 10/21/2009  . Hyperlipidemia 06/10/2009  . Coronary artery disease of native artery of native heart with stable angina pectoris ([place]) 06/10/2009  . GERD 06/10/2009  . BARRETTS ESOPHAGUS 06/10/2009  . SHORTNESS OF BREATH 05/31/2009  . CONCUSSION WITH LOC OF 30 MINUTES OR LESS 02/28/2009  . LACERATION, SCALP 02/28/2009  . NEPHROLITHIASIS 07/01/2007  . CONTACT DERMATITIS 12/12/2006  . BPH with urinary obstruction 11/04/2006  . COLONIC POLYPS 10/21/2003  .  DIVERTICULOSIS, COLON 10/21/2003    Past Surgical History:  Procedure Laterality Date  . CARDIAC CATHETERIZATION N/A 01/19/2016   Procedure: Left Heart Cath and Coronary Angiography;  Surgeon: Burnell Blanks, MD;  Location: [facility];  Service: Cardiovascular;  Laterality: N/A;  . COLONOSCOPY  12/09/2014   per Dr. Silverio Decamp, adenomatous polyps, repeat 3 years   . EGD with dilatation  02/17/09   Barretts esophagus   . heart stent    . Left knee surgery    . left knee surgery    . TONSILLECTOMY          Home Medications    Prior to Admission medications   Medication Sig Start Date End Date Taking? Authorizing  Provider  aspirin EC 81 MG tablet Take 81 mg by mouth daily.   Yes [provider]  cetirizine (ZYRTEC) 10 MG tablet Take 10 mg by mouth daily.   Yes [provider]  isosorbide mononitrate (IMDUR) 30 MG 24 hr tablet Take 1 tablet (30 mg total) by mouth daily. 01/26/16 10/06/17 Yes Eileen Stanford, PA-C  leflunomide (ARAVA) 20 MG tablet Take 1 tablet (20 mg total) by mouth 2 (two) times daily. 12/05/16  Yes [name], MD  metoprolol succinate (TOPROL-XL) 25 MG 24 hr tablet TAKE 1 TABLET BY MOUTH DAILY Patient taking differently: Take 25 mg by mouth daily.  02/04/17  Yes [name], MD  Multiple Vitamin (MULTIVITAMIN WITH MINERALS) TABS Take 1 tablet by mouth daily.   Yes [provider]  naproxen (NAPROSYN) 500 MG tablet Take 500 mg by mouth 2 (two) times daily. 05/02/16  Yes [provider]  nitroGLYCERIN (NITROSTAT) 0.4 MG SL tablet Place 1 tablet (0.4 mg total) under the tongue every 5 (five) minutes as needed for chest pain (3 doses max). 12/27/14  Yes Josue Hector, MD  omeprazole (PRILOSEC) 40 MG capsule Take 40 mg by mouth daily.   Yes [provider]  predniSONE (DELTASONE) 5 MG tablet Take 5 mg by mouth daily as needed (arthritis).  09/21/17  Yes [provider]  senna-docusate (SB DOCUSATE SODIUM/SENNA) 8.6-50 MG per tablet Take 1 tablet by mouth daily as needed for moderate constipation.    Yes [provider]  sildenafil (REVATIO) 20 MG tablet Take 20 mg by mouth daily as needed Patient taking differently: Take 20 mg by mouth daily as needed (erectile disfunction).  02/22/17  Yes [name], MD  simvastatin (ZOCOR) 40 MG tablet Take 1 tablet (40 mg total) by mouth daily. 12/05/16  Yes [name], MD  tamsulosin (FLOMAX) 0.4 MG CAPS capsule TAKE ONE CAPSULE BY MOUTH DAILY Patient taking differently: Take 0.4 mg by mouth daily.  05/07/17  Yes [name], MD  zolpidem (AMBIEN) 10 MG tablet Take 1 tablet (10  mg total) by mouth at bedtime. 02/22/17  Yes [name], MD  cyclobenzaprine (FLEXERIL) 10 MG tablet Take 1 tablet (10 mg total) by mouth 2 (two) times daily as needed for muscle spasms. 10/06/17   Volanda Napoleon, PA-C  diclofenac sodium (VOLTAREN) 1 % GEL Apply 2 g topically 4 (four) times daily. 10/06/17   Volanda Napoleon, PA-C    Family History Family History  Problem Relation Age of Onset  . Heart attack Father        cardiovascular disorder  . Arthritis Father        family hx  . Colon cancer Father  mets  . Prostate cancer Father        1st degree relative  . Arthritis Mother   . Diabetes Paternal Uncle   . Colon cancer Paternal Uncle   . Cancer Maternal Grandmother   . Skin cancer Daughter   . Esophageal cancer Neg Hx   . Stomach cancer Neg Hx   . Rectal cancer Neg Hx     Social History Social History   Tobacco Use  . Smoking status: Former Smoker    Packs/day: 1.00    Years: 20.00    Pack years: 20.00    Types: Cigarettes    Last attempt to quit: 10/12/1988    Years since quitting: 29.0  . Smokeless tobacco: Never Used  . Tobacco comment: 1960's   Substance Use Topics  . Alcohol use: Yes    Alcohol/week: 0.0 standard drinks    Comment: rare  . Drug use: No     Allergies   Cephalexin; Clarithromycin; Certolizumab pegol; Doxycycline; Hydroxyzine; Lidoderm; and Pyrithione zinc   Review of Systems Review of Systems  Constitutional: Negative for fever.  Respiratory: Negative for cough and shortness of breath.   Cardiovascular: Negative for chest pain.  Gastrointestinal: Negative for abdominal pain, nausea and vomiting.  Genitourinary: Positive for flank pain. Negative for dysuria and hematuria.  Musculoskeletal: Positive for back pain.  Neurological: Negative for weakness, numbness and headaches.  All other systems reviewed and are negative.    Physical Exam Updated Vital Signs BP (!) 163/97 (BP Location: Right Arm)   Pulse 67   Temp (!)  97.3 F (36.3 C) (Oral)   Resp 14   SpO2 99%   Physical Exam  Constitutional: He is oriented to person, place, and time. He appears well-developed and well-nourished.  HENT:  Head: Normocephalic and atraumatic.  Mouth/Throat: Oropharynx is clear and moist and mucous membranes are normal.  Eyes: Pupils are equal, round, and reactive to light. Conjunctivae, EOM and lids are normal.  Neck: Full passive range of motion without pain.  Cardiovascular: Normal rate, regular rhythm, normal heart sounds and normal pulses. Exam reveals no gallop and no friction rub.  No murmur heard. Pulses:      Radial pulses are 2+ on the right side, and 2+ on the left side.       Dorsalis pedis pulses are 2+ on the right side, and 2+ on the left side.  Pulmonary/Chest: Effort normal and breath sounds normal.  Lungs clear to auscultation bilaterally.  Symmetric chest rise.  No wheezing, rales, rhonchi.  Abdominal: Soft. Normal appearance. There is no tenderness. There is CVA tenderness (Right). There is no rigidity and no guarding. Hernia confirmed negative in the right inguinal area and confirmed negative in the left inguinal area.  Abdomen is soft, non-distended, non-tender. No rigidity, No guarding. No peritoneal signs. Right sided CVA tenderness.   Genitourinary: Testes normal and penis normal. Right testis shows no swelling and no tenderness. Left testis shows no swelling and no tenderness. Circumcised.  Genitourinary Comments: The exam was performed with a chaperone present. Normal male genitalia. No evidence of rash, ulcers or lesions.   Musculoskeletal: Normal range of motion.       Lumbar back: He exhibits no tenderness.  Neurological: He is alert and oriented to person, place, and time.  5/5 strength BUE and BLE Mild SLR on the right.  Sensation intact along major nerve distributions of BLE  Skin: Skin is warm and dry. Capillary refill takes less than 2 seconds.  Good distal cap refill. RLE is not  dusky in appearance or cool to touch. No rash noted.   Psychiatric: He has a normal mood and affect. His speech is normal.  Nursing note and vitals reviewed.    ED Treatments / Results  Labs (all labs ordered are listed, but only abnormal results are displayed) Labs Reviewed  URINALYSIS, ROUTINE W REFLEX MICROSCOPIC - Abnormal; Notable for the following components:      Result Value   Bilirubin Urine SMALL (*)    All other components within normal limits  BASIC METABOLIC PANEL - Abnormal; Notable for the following components:   Glucose, Bld 108 (*)    All other components within normal limits  CBC WITH DIFFERENTIAL/PLATELET    EKG None  Radiology Ct Angio Abd/pel W/ And/or W/o  Result Date: 10/06/2017 CLINICAL DATA:  72 year old male with history of flank pack pain for 1 week, worsening today. Numbness, burning and tingling in the right leg. Urinary frequency. Nausea. EXAM: CTA ABDOMEN AND PELVIS WITH CONTRAST TECHNIQUE: Multidetector CT imaging of the abdomen and pelvis was performed using the standard protocol during bolus administration of intravenous contrast. Multiplanar reconstructed images and MIPs were obtained and reviewed to evaluate the vascular anatomy. CONTRAST:  138mL ISOVUE-370 IOPAMIDOL (ISOVUE-370) INJECTION 76% COMPARISON:  CT the abdomen and pelvis 05/01/2007. FINDINGS: VASCULAR Aorta: Aortic atherosclerosis (mild). Normal caliber aorta without aneurysm, dissection, vasculitis or significant stenosis. Celiac: Patent without evidence of aneurysm, dissection, vasculitis or significant stenosis. SMA: Patent without evidence of aneurysm, dissection, vasculitis or significant stenosis. Renals: Renal arteries (single right and 2 left) are patent without evidence of aneurysm, dissection, vasculitis, fibromuscular dysplasia or significant stenosis. IMA: Patent without evidence of aneurysm, dissection, vasculitis or significant stenosis. Inflow: Patent without evidence of aneurysm,  dissection, vasculitis or significant stenosis. Proximal Outflow: Bilateral common femoral and visualized portions of the superficial and profunda femoral arteries are patent without evidence of aneurysm, dissection, vasculitis or significant stenosis. Veins: No obvious venous abnormality within the limitations of this arterial phase study. Review of the MIP images confirms the above findings. NON-VASCULAR Lower chest: Mild scarring or atelectasis in the medial segment of the right middle lobe. Hepatobiliary: No suspicious cystic or solid hepatic lesions. No intra or extrahepatic biliary ductal dilatation. Gallbladder is normal in appearance. Pancreas: No pancreatic mass. No pancreatic ductal dilatation. No pancreatic or peripancreatic fluid or inflammatory changes. Spleen: Unremarkable. Adrenals/Urinary Tract: In the interpolar region of the right kidney there is a 3.1 x 4.9 cm low-attenuation lesion, compatible with a simple cyst. 2.8 cm simple cyst in the anterior aspect of the interpolar region of the left kidney. Several nonobstructive calculi are noted within the lower pole collecting system of left kidney measuring up to 7 mm. No ureteral stones. No hydroureteronephrosis. Bilateral adrenal glands are normal in appearance. Urinary bladder is normal in appearance. Stomach/Bowel: Normal appearance of the stomach. No pathologic dilatation of small bowel or colon. Numerous colonic diverticulae are noted, without surrounding inflammatory changes to suggest an acute diverticulitis at this time. Normal appendix. Lymphatic: No lymphadenopathy noted in the abdomen or pelvis. Reproductive: Prostate gland and seminal vesicles are unremarkable in appearance. Other: No significant volume of ascites.  No pneumoperitoneum. Musculoskeletal: There are no aggressive appearing lytic or blastic lesions noted in the visualized portions of the skeleton. IMPRESSION: VASCULAR 1. Mild aortic atherosclerosis, without evidence of  aneurysm or dissection in the abdominal or pelvic vasculature. NON-VASCULAR 1. No acute findings are noted in the abdomen or pelvis to account  for the patient's symptoms. 2. Several nonobstructive calculi are noted within the lower pole collecting system of left kidney measuring up to 7 mm. No ureteral stones or findings of urinary tract obstruction are noted at this time. 3. Severe colonic diverticulosis without evidence of acute diverticulitis at this time. Electronically Signed   By: Vinnie Langton M.D.   On: 10/06/2017 20:43    Procedures Procedures (including critical care time)  Medications Ordered in ED Medications  iopamidol (ISOVUE-370) 76 % injection (has no administration in time range)  cyclobenzaprine (FLEXERIL) tablet 10 mg (10 mg Oral Not Given 10/06/17 2130)  morphine 4 MG/ML injection 4 mg (4 mg Intravenous Given 10/06/17 1853)  fentaNYL (SUBLIMAZE) injection 50 mcg (50 mcg Intravenous Given 10/06/17 1953)  iopamidol (ISOVUE-370) 76 % injection 100 mL (100 mLs Intravenous Contrast Given 10/06/17 2003)     Initial Impression / Assessment and Plan / ED Course  I have reviewed the triage vital signs and the nursing notes.  Pertinent labs & imaging results that were available during my care of the patient were reviewed by me and considered in my medical decision making (see chart for details).     72 y.o. male presents for evaluation of back pain, flank pain is been ongoing for 1 week.  Came to the ED today because he started having some tingling and burning sensation to the leg.  Patient reports that he has a history of kidney stones was concerned that he was having recurrent kidney stones.  No urinary light, fever, nausea/vomiting.  No numbness/weakness of arms or legs.  Patient no red flag symptoms or neuro deficits.  Consider muscular skeletal pain versus kidney stone versus infectious etiology.  Given patient's symptoms, his age and vasculopath history and the fact that he is  hypertensive in the ED, also consider dissection, though low suspicion given overall appearance.  Plan to check basic labs and UA and then decide on best course of imaging.  History/physical exam is not concerning for shingles.  CBC without any significant leukocytosis, anemia.  BMP is unremarkable.  UA shows no evidence of hemoglobin, acute infectious etiology.   CTA abd and pelvis shows no evidence of ureteral stones.  There is mention of renal stones noted in the left kidney.  Also mention of diverticulosis without any evidence of diverticulitis.  No evidence of aneurysm, dissection.  Suspect her symptoms are likely related to musculoskeletal pain.  Discussed results with patient.  He reports improvement in pain after analgesics.  Suspect this is like likely musculoskeletal in nature.  We will plan to send home some Voltaren gel and Flexeril for symptomatic relief.  Encourage at home supportive care measures. Patient had ample opportunity for questions and discussion. All patient's questions were answered with full understanding. Strict return precautions discussed. Patient expresses understanding and agreement to plan.   Final Clinical Impressions(s) / ED Diagnoses   Final diagnoses:  Acute right-sided low back pain with right-sided sciatica  Muscle strain    ED Discharge Orders         Ordered    cyclobenzaprine (FLEXERIL) 10 MG tablet  2 times daily PRN     10/06/17 2114    diclofenac sodium (VOLTAREN) 1 % GEL  4 times daily     10/06/17 2114           Volanda Napoleon, PA-C 10/06/17 2359    Blanchie Dessert, MD 10/07/17 2328

## 2017-10-07 ENCOUNTER — Emergency Department (HOSPITAL_COMMUNITY): Payer: Medicare Other

## 2017-10-07 ENCOUNTER — Inpatient Hospital Stay (HOSPITAL_COMMUNITY): Payer: Medicare Other | Admitting: Anesthesiology

## 2017-10-07 ENCOUNTER — Encounter (HOSPITAL_COMMUNITY)
Admission: EM | Disposition: A | Discharge status: Home / Self Care | Payer: Self-pay | Source: Home / Self Care | Attending: Neurosurgery

## 2017-10-07 ENCOUNTER — Inpatient Hospital Stay (HOSPITAL_COMMUNITY)
Admission: EM | Admit: 2017-10-07 | Discharge: 2017-10-08 | DRG: 517 | Disposition: A | Discharge status: Home / Self Care | Payer: Medicare Other | Attending: Neurosurgery | Admitting: Neurosurgery

## 2017-10-07 ENCOUNTER — Other Ambulatory Visit: Payer: Self-pay

## 2017-10-07 ENCOUNTER — Encounter (HOSPITAL_COMMUNITY): Payer: Self-pay | Admitting: Emergency Medicine

## 2017-10-07 ENCOUNTER — Inpatient Hospital Stay (HOSPITAL_COMMUNITY): Payer: Medicare Other

## 2017-10-07 DIAGNOSIS — Z87891 Personal history of nicotine dependence: Secondary | ICD-10-CM

## 2017-10-07 DIAGNOSIS — I10 Essential (primary) hypertension: Secondary | ICD-10-CM | POA: Diagnosis not present

## 2017-10-07 DIAGNOSIS — E785 Hyperlipidemia, unspecified: Secondary | ICD-10-CM | POA: Diagnosis present

## 2017-10-07 DIAGNOSIS — Z881 Allergy status to other antibiotic agents status: Secondary | ICD-10-CM

## 2017-10-07 DIAGNOSIS — M5126 Other intervertebral disc displacement, lumbar region: Secondary | ICD-10-CM | POA: Diagnosis not present

## 2017-10-07 DIAGNOSIS — M545 Low back pain: Secondary | ICD-10-CM | POA: Diagnosis not present

## 2017-10-07 DIAGNOSIS — M5489 Other dorsalgia: Secondary | ICD-10-CM | POA: Diagnosis not present

## 2017-10-07 DIAGNOSIS — I252 Old myocardial infarction: Secondary | ICD-10-CM | POA: Diagnosis not present

## 2017-10-07 DIAGNOSIS — Z79899 Other long term (current) drug therapy: Secondary | ICD-10-CM

## 2017-10-07 DIAGNOSIS — M5136 Other intervertebral disc degeneration, lumbar region: Secondary | ICD-10-CM

## 2017-10-07 DIAGNOSIS — J449 Chronic obstructive pulmonary disease, unspecified: Secondary | ICD-10-CM | POA: Diagnosis present

## 2017-10-07 DIAGNOSIS — Z419 Encounter for procedure for purposes other than remedying health state, unspecified: Secondary | ICD-10-CM

## 2017-10-07 DIAGNOSIS — Z7982 Long term (current) use of aspirin: Secondary | ICD-10-CM

## 2017-10-07 DIAGNOSIS — I2510 Atherosclerotic heart disease of native coronary artery without angina pectoris: Secondary | ICD-10-CM | POA: Diagnosis present

## 2017-10-07 DIAGNOSIS — K219 Gastro-esophageal reflux disease without esophagitis: Secondary | ICD-10-CM | POA: Diagnosis not present

## 2017-10-07 DIAGNOSIS — M5116 Intervertebral disc disorders with radiculopathy, lumbar region: Principal | ICD-10-CM | POA: Diagnosis present

## 2017-10-07 DIAGNOSIS — Z981 Arthrodesis status: Secondary | ICD-10-CM | POA: Diagnosis not present

## 2017-10-07 DIAGNOSIS — Z888 Allergy status to other drugs, medicaments and biological substances status: Secondary | ICD-10-CM

## 2017-10-07 DIAGNOSIS — M5106 Intervertebral disc disorders with myelopathy, lumbar region: Secondary | ICD-10-CM | POA: Diagnosis not present

## 2017-10-07 HISTORY — PX: LUMBAR LAMINECTOMY/DECOMPRESSION MICRODISCECTOMY: SHX5026

## 2017-10-07 LAB — COMPREHENSIVE METABOLIC PANEL
ALT: 21 U/L (ref 0–44)
AST: 27 U/L (ref 15–41)
Albumin: 3.9 g/dL (ref 3.5–5.0)
Alkaline Phosphatase: 34 U/L — ABNORMAL LOW (ref 38–126)
Anion gap: 8 (ref 5–15)
BUN: 15 mg/dL (ref 8–23)
CO2: 24 mmol/L (ref 22–32)
Calcium: 10.2 mg/dL (ref 8.9–10.3)
Chloride: 109 mmol/L (ref 98–111)
Creatinine, Ser: 0.94 mg/dL (ref 0.61–1.24)
GFR calc Af Amer: >60 mL/min (ref >60)
GFR calc non Af Amer: >60 mL/min (ref >60)
Glucose, Bld: 112 mg/dL — ABNORMAL HIGH (ref 70–99)
Potassium: 4.2 mmol/L (ref 3.5–5.1)
Sodium: 141 mmol/L (ref 135–145)
Total Bilirubin: 1 mg/dL (ref 0.3–1.2)
Total Protein: 7.1 g/dL (ref 6.5–8.1)

## 2017-10-07 LAB — CBC WITH DIFFERENTIAL/PLATELET
Basophils Absolute: 0.1 10*3/uL (ref 0.0–0.1)
Basophils Relative: 1 %
Eosinophils Absolute: 0.3 10*3/uL (ref 0.0–0.7)
Eosinophils Relative: 4 %
HCT: 43.4 % (ref 39.0–52.0)
Hemoglobin: 14.8 g/dL (ref 13.0–17.0)
Lymphocytes Relative: 11 %
Lymphs Abs: 1 10*3/uL (ref 0.7–4.0)
MCH: 30.3 pg (ref 26.0–34.0)
MCHC: 34.1 g/dL (ref 30.0–36.0)
MCV: 88.8 fL (ref 78.0–100.0)
Monocytes Absolute: 0.8 10*3/uL (ref 0.1–1.0)
Monocytes Relative: 9 %
Neutro Abs: 7 10*3/uL (ref 1.7–7.7)
Neutrophils Relative %: 75 %
Platelets: 169 10*3/uL (ref 150–400)
RBC: 4.89 MIL/uL (ref 4.22–5.81)
RDW: 13.7 % (ref 11.5–15.5)
WBC: 9.2 10*3/uL (ref 4.0–10.5)

## 2017-10-07 LAB — URINALYSIS, ROUTINE W REFLEX MICROSCOPIC
Bilirubin Urine: NEGATIVE
Glucose, UA: NEGATIVE mg/dL
Hgb urine dipstick: NEGATIVE
Ketones, ur: 5 mg/dL — AB
Leukocytes, UA: NEGATIVE
Nitrite: NEGATIVE
Protein, ur: NEGATIVE mg/dL
Specific Gravity, Urine: 1.023 (ref 1.005–1.030)
pH: 7 (ref 5.0–8.0)

## 2017-10-07 SURGERY — LUMBAR LAMINECTOMY/DECOMPRESSION MICRODISCECTOMY 1 LEVEL
Anesthesia: General | Site: Back | Laterality: Right

## 2017-10-07 MED ORDER — HYDROMORPHONE HCL 1 MG/ML IJ SOLN
1.0000 mg | Freq: Once | INTRAMUSCULAR | Status: AC
  Administered 2017-10-07: 1 mg via INTRAVENOUS
  Filled 2017-10-07: qty 1

## 2017-10-07 MED ORDER — LEFLUNOMIDE 20 MG PO TABS
20.0000 mg | ORAL_TABLET | ORAL | Status: DC
  Administered 2017-10-08: 20 mg via ORAL
  Filled 2017-10-07: qty 1

## 2017-10-07 MED ORDER — HYDROMORPHONE HCL 1 MG/ML IJ SOLN
0.5000 mg | Freq: Once | INTRAMUSCULAR | Status: AC
  Administered 2017-10-07: 0.5 mg via INTRAVENOUS
  Filled 2017-10-07: qty 1

## 2017-10-07 MED ORDER — FENTANYL CITRATE (PF) 250 MCG/5ML IJ SOLN
INTRAMUSCULAR | Status: DC | PRN
  Administered 2017-10-07: 50 ug via INTRAVENOUS
  Administered 2017-10-07: 100 ug via INTRAVENOUS
  Administered 2017-10-07: 50 ug via INTRAVENOUS

## 2017-10-07 MED ORDER — FENTANYL CITRATE (PF) 100 MCG/2ML IJ SOLN: 25.0000 ug | INTRAMUSCULAR | Status: DC | PRN

## 2017-10-07 MED ORDER — METHOCARBAMOL 1000 MG/10ML IJ SOLN
500.0000 mg | Freq: Four times a day (QID) | INTRAVENOUS | Status: DC | PRN
  Filled 2017-10-07: qty 5

## 2017-10-07 MED ORDER — LIDOCAINE-EPINEPHRINE 1 %-1:100000 IJ SOLN
INTRAMUSCULAR | Status: AC
  Filled 2017-10-07: qty 1

## 2017-10-07 MED ORDER — VANCOMYCIN HCL IN DEXTROSE 1-5 GM/200ML-% IV SOLN
1000.0000 mg | Freq: Once | INTRAVENOUS | Status: DC
  Filled 2017-10-07: qty 200

## 2017-10-07 MED ORDER — SUGAMMADEX SODIUM 200 MG/2ML IV SOLN
INTRAVENOUS | Status: DC | PRN
  Administered 2017-10-07: 200 mg via INTRAVENOUS

## 2017-10-07 MED ORDER — EPHEDRINE 5 MG/ML INJ
INTRAVENOUS | Status: AC
  Filled 2017-10-07: qty 10

## 2017-10-07 MED ORDER — THROMBIN 5000 UNITS EX SOLR
CUTANEOUS | Status: AC
  Filled 2017-10-07: qty 10000

## 2017-10-07 MED ORDER — ACETAMINOPHEN 500 MG PO TABS
1000.0000 mg | ORAL_TABLET | Freq: Four times a day (QID) | ORAL | Status: DC
  Administered 2017-10-08 (×2): 1000 mg via ORAL
  Filled 2017-10-07 (×2): qty 2

## 2017-10-07 MED ORDER — METHYLPREDNISOLONE ACETATE 80 MG/ML IJ SUSP
INTRAMUSCULAR | Status: AC
  Filled 2017-10-07: qty 1

## 2017-10-07 MED ORDER — ZOLPIDEM TARTRATE 5 MG PO TABS: 5.0000 mg | ORAL_TABLET | Freq: Every evening | ORAL | Status: DC | PRN

## 2017-10-07 MED ORDER — SENNOSIDES-DOCUSATE SODIUM 8.6-50 MG PO TABS: 1.0000 | ORAL_TABLET | Freq: Every evening | ORAL | Status: DC | PRN

## 2017-10-07 MED ORDER — ONDANSETRON HCL 4 MG/2ML IJ SOLN
INTRAMUSCULAR | Status: AC
  Filled 2017-10-07: qty 2

## 2017-10-07 MED ORDER — SODIUM CHLORIDE 0.9 % IV SOLN
INTRAVENOUS | Status: DC | PRN
  Administered 2017-10-07: 30 ug/min via INTRAVENOUS

## 2017-10-07 MED ORDER — SODIUM CHLORIDE 0.9% FLUSH: 3.0000 mL | INTRAVENOUS | Status: DC | PRN

## 2017-10-07 MED ORDER — METOPROLOL SUCCINATE ER 25 MG PO TB24
25.0000 mg | ORAL_TABLET | Freq: Every day | ORAL | Status: DC
  Administered 2017-10-08: 25 mg via ORAL
  Filled 2017-10-07: qty 1

## 2017-10-07 MED ORDER — MENTHOL 3 MG MT LOZG: 1.0000 | LOZENGE | OROMUCOSAL | Status: DC | PRN

## 2017-10-07 MED ORDER — METHOCARBAMOL 500 MG PO TABS: 500.0000 mg | ORAL_TABLET | Freq: Four times a day (QID) | ORAL | Status: DC | PRN

## 2017-10-07 MED ORDER — LIDOCAINE-EPINEPHRINE 1 %-1:100000 IJ SOLN
INTRAMUSCULAR | Status: DC | PRN
  Administered 2017-10-07: 5 mL via INTRADERMAL

## 2017-10-07 MED ORDER — LACTATED RINGERS IV SOLN
INTRAVENOUS | Status: DC | PRN
  Administered 2017-10-07 (×2): via INTRAVENOUS

## 2017-10-07 MED ORDER — ROCURONIUM BROMIDE 10 MG/ML (PF) SYRINGE
PREFILLED_SYRINGE | INTRAVENOUS | Status: DC | PRN
  Administered 2017-10-07: 50 mg via INTRAVENOUS

## 2017-10-07 MED ORDER — TAMSULOSIN HCL 0.4 MG PO CAPS
0.4000 mg | ORAL_CAPSULE | Freq: Every day | ORAL | Status: DC
  Administered 2017-10-08: 0.4 mg via ORAL
  Filled 2017-10-07: qty 1

## 2017-10-07 MED ORDER — DIAZEPAM 2 MG PO TABS
2.0000 mg | ORAL_TABLET | Freq: Once | ORAL | Status: DC
  Filled 2017-10-07: qty 1

## 2017-10-07 MED ORDER — PHENYLEPHRINE 40 MCG/ML (10ML) SYRINGE FOR IV PUSH (FOR BLOOD PRESSURE SUPPORT)
PREFILLED_SYRINGE | INTRAVENOUS | Status: AC
  Filled 2017-10-07: qty 10

## 2017-10-07 MED ORDER — ACETAMINOPHEN 325 MG PO TABS: 650.0000 mg | ORAL_TABLET | ORAL | Status: DC | PRN

## 2017-10-07 MED ORDER — ROCURONIUM BROMIDE 50 MG/5ML IV SOSY
PREFILLED_SYRINGE | INTRAVENOUS | Status: AC
  Filled 2017-10-07: qty 5

## 2017-10-07 MED ORDER — BUPIVACAINE HCL (PF) 0.5 % IJ SOLN
INTRAMUSCULAR | Status: DC | PRN
  Administered 2017-10-07: 5 mL

## 2017-10-07 MED ORDER — HEMOSTATIC AGENTS (NO CHARGE) OPTIME
TOPICAL | Status: DC | PRN
  Administered 2017-10-07: 1 via TOPICAL

## 2017-10-07 MED ORDER — PROPOFOL 10 MG/ML IV BOLUS
INTRAVENOUS | Status: AC
  Filled 2017-10-07: qty 20

## 2017-10-07 MED ORDER — FLEET ENEMA 7-19 GM/118ML RE ENEM: 1.0000 | ENEMA | Freq: Once | RECTAL | Status: DC | PRN

## 2017-10-07 MED ORDER — MIDAZOLAM HCL 2 MG/2ML IJ SOLN
INTRAMUSCULAR | Status: AC
  Filled 2017-10-07: qty 2

## 2017-10-07 MED ORDER — SENNOSIDES-DOCUSATE SODIUM 8.6-50 MG PO TABS: 1.0000 | ORAL_TABLET | Freq: Every day | ORAL | Status: DC | PRN

## 2017-10-07 MED ORDER — SENNA 8.6 MG PO TABS
1.0000 | ORAL_TABLET | Freq: Two times a day (BID) | ORAL | Status: DC
  Administered 2017-10-07: 8.6 mg via ORAL
  Filled 2017-10-07 (×2): qty 1

## 2017-10-07 MED ORDER — SUCCINYLCHOLINE CHLORIDE 200 MG/10ML IV SOSY
PREFILLED_SYRINGE | INTRAVENOUS | Status: AC
  Filled 2017-10-07: qty 10

## 2017-10-07 MED ORDER — NITROGLYCERIN 0.4 MG SL SUBL: 0.4000 mg | SUBLINGUAL_TABLET | SUBLINGUAL | Status: DC | PRN

## 2017-10-07 MED ORDER — LIDOCAINE 2% (20 MG/ML) 5 ML SYRINGE
INTRAMUSCULAR | Status: DC | PRN
  Administered 2017-10-07: 100 mg via INTRAVENOUS

## 2017-10-07 MED ORDER — SODIUM CHLORIDE 0.9 % IV SOLN
INTRAVENOUS | Status: DC | PRN
  Administered 2017-10-07: 500 mL

## 2017-10-07 MED ORDER — BISACODYL 10 MG RE SUPP: 10.0000 mg | Freq: Every day | RECTAL | Status: DC | PRN

## 2017-10-07 MED ORDER — ACETAMINOPHEN 650 MG RE SUPP: 650.0000 mg | RECTAL | Status: DC | PRN

## 2017-10-07 MED ORDER — VANCOMYCIN HCL 1000 MG IV SOLR
INTRAVENOUS | Status: DC | PRN
  Administered 2017-10-07: 1000 mg via INTRAVENOUS

## 2017-10-07 MED ORDER — CYCLOBENZAPRINE HCL 10 MG PO TABS: 10.0000 mg | ORAL_TABLET | Freq: Two times a day (BID) | ORAL | Status: DC | PRN

## 2017-10-07 MED ORDER — ISOSORBIDE MONONITRATE ER 30 MG PO TB24
30.0000 mg | ORAL_TABLET | Freq: Every day | ORAL | Status: DC
  Filled 2017-10-07: qty 1

## 2017-10-07 MED ORDER — SODIUM CHLORIDE 0.9 % IV SOLN: 250.0000 mL | INTRAVENOUS | Status: DC

## 2017-10-07 MED ORDER — HYDROCODONE-ACETAMINOPHEN 5-325 MG PO TABS: 1.0000 | ORAL_TABLET | ORAL | Status: DC | PRN

## 2017-10-07 MED ORDER — THROMBIN 5000 UNITS EX SOLR
CUTANEOUS | Status: DC | PRN
  Administered 2017-10-07 (×2): 5000 [IU] via TOPICAL

## 2017-10-07 MED ORDER — SIMVASTATIN 40 MG PO TABS
40.0000 mg | ORAL_TABLET | Freq: Every day | ORAL | Status: DC
  Administered 2017-10-07: 40 mg via ORAL
  Filled 2017-10-07: qty 1

## 2017-10-07 MED ORDER — ONDANSETRON HCL 4 MG/2ML IJ SOLN: 4.0000 mg | Freq: Once | INTRAMUSCULAR | Status: DC | PRN

## 2017-10-07 MED ORDER — PHENOL 1.4 % MT LIQD: 1.0000 | OROMUCOSAL | Status: DC | PRN

## 2017-10-07 MED ORDER — MIDAZOLAM HCL 5 MG/5ML IJ SOLN
INTRAMUSCULAR | Status: DC | PRN
  Administered 2017-10-07: 2 mg via INTRAVENOUS

## 2017-10-07 MED ORDER — HYDROMORPHONE HCL 1 MG/ML IJ SOLN
0.5000 mg | INTRAMUSCULAR | Status: DC | PRN
  Administered 2017-10-07: 1 mg via INTRAVENOUS
  Filled 2017-10-07: qty 1

## 2017-10-07 MED ORDER — ONDANSETRON HCL 4 MG/2ML IJ SOLN: 4.0000 mg | Freq: Four times a day (QID) | INTRAMUSCULAR | Status: DC | PRN

## 2017-10-07 MED ORDER — VANCOMYCIN HCL IN DEXTROSE 1-5 GM/200ML-% IV SOLN: 1000.0000 mg | INTRAVENOUS | Status: DC

## 2017-10-07 MED ORDER — SODIUM CHLORIDE 0.9% FLUSH
3.0000 mL | Freq: Two times a day (BID) | INTRAVENOUS | Status: DC
  Administered 2017-10-07: 3 mL via INTRAVENOUS

## 2017-10-07 MED ORDER — ADULT MULTIVITAMIN W/MINERALS CH
1.0000 | ORAL_TABLET | Freq: Every day | ORAL | Status: DC
  Administered 2017-10-08: 1 via ORAL
  Filled 2017-10-07: qty 1

## 2017-10-07 MED ORDER — METHYLPREDNISOLONE ACETATE 80 MG/ML IJ SUSP
INTRAMUSCULAR | Status: DC | PRN
  Administered 2017-10-07: 80 mg

## 2017-10-07 MED ORDER — PHENYLEPHRINE 40 MCG/ML (10ML) SYRINGE FOR IV PUSH (FOR BLOOD PRESSURE SUPPORT)
PREFILLED_SYRINGE | INTRAVENOUS | Status: DC | PRN
  Administered 2017-10-07 (×2): 80 ug via INTRAVENOUS

## 2017-10-07 MED ORDER — DEXAMETHASONE SODIUM PHOSPHATE 10 MG/ML IJ SOLN
10.0000 mg | Freq: Once | INTRAMUSCULAR | Status: AC
  Administered 2017-10-07: 10 mg via INTRAVENOUS
  Filled 2017-10-07: qty 1

## 2017-10-07 MED ORDER — DOCUSATE SODIUM 100 MG PO CAPS
100.0000 mg | ORAL_CAPSULE | Freq: Two times a day (BID) | ORAL | Status: DC
  Administered 2017-10-07: 100 mg via ORAL
  Filled 2017-10-07 (×2): qty 1

## 2017-10-07 MED ORDER — PROPOFOL 10 MG/ML IV BOLUS
INTRAVENOUS | Status: DC | PRN
  Administered 2017-10-07: 120 mg via INTRAVENOUS

## 2017-10-07 MED ORDER — 0.9 % SODIUM CHLORIDE (POUR BTL) OPTIME
TOPICAL | Status: DC | PRN
  Administered 2017-10-07: 1000 mL

## 2017-10-07 MED ORDER — OXYCODONE HCL 5 MG PO TABS: 5.0000 mg | ORAL_TABLET | ORAL | Status: DC | PRN

## 2017-10-07 MED ORDER — LIDOCAINE 2% (20 MG/ML) 5 ML SYRINGE
INTRAMUSCULAR | Status: AC
  Filled 2017-10-07: qty 5

## 2017-10-07 MED ORDER — PREDNISONE 5 MG PO TABS: 5.0000 mg | ORAL_TABLET | Freq: Every day | ORAL | Status: DC | PRN

## 2017-10-07 MED ORDER — VANCOMYCIN HCL IN DEXTROSE 1-5 GM/200ML-% IV SOLN
INTRAVENOUS | Status: AC
  Filled 2017-10-07: qty 200

## 2017-10-07 MED ORDER — LORATADINE 10 MG PO TABS
10.0000 mg | ORAL_TABLET | Freq: Every day | ORAL | Status: DC
  Administered 2017-10-08: 10 mg via ORAL
  Filled 2017-10-07: qty 1

## 2017-10-07 MED ORDER — NAPROXEN 250 MG PO TABS
500.0000 mg | ORAL_TABLET | Freq: Two times a day (BID) | ORAL | Status: DC
  Administered 2017-10-07 – 2017-10-08 (×2): 500 mg via ORAL
  Filled 2017-10-07 (×2): qty 2

## 2017-10-07 MED ORDER — GABAPENTIN 300 MG PO CAPS
300.0000 mg | ORAL_CAPSULE | Freq: Three times a day (TID) | ORAL | Status: DC
  Administered 2017-10-07 – 2017-10-08 (×2): 300 mg via ORAL
  Filled 2017-10-07 (×2): qty 1

## 2017-10-07 MED ORDER — ONDANSETRON HCL 4 MG/2ML IJ SOLN
INTRAMUSCULAR | Status: DC | PRN
  Administered 2017-10-07: 4 mg via INTRAVENOUS

## 2017-10-07 MED ORDER — BUPIVACAINE HCL (PF) 0.5 % IJ SOLN
INTRAMUSCULAR | Status: AC
  Filled 2017-10-07: qty 30

## 2017-10-07 MED ORDER — ONDANSETRON HCL 4 MG PO TABS: 4.0000 mg | ORAL_TABLET | Freq: Four times a day (QID) | ORAL | Status: DC | PRN

## 2017-10-07 MED ORDER — FENTANYL CITRATE (PF) 250 MCG/5ML IJ SOLN
INTRAMUSCULAR | Status: AC
  Filled 2017-10-07: qty 5

## 2017-10-07 MED ORDER — SODIUM CHLORIDE 0.9 % IV SOLN
INTRAVENOUS | Status: DC
  Administered 2017-10-07: 23:00:00 via INTRAVENOUS

## 2017-10-07 SURGICAL SUPPLY — 64 items
ADH SKN CLS APL DERMABOND .7 (GAUZE/BANDAGES/DRESSINGS) ×1
APL SKNCLS STERI-STRIP NONHPOA (GAUZE/BANDAGES/DRESSINGS)
BAG DECANTER FOR FLEXI CONT (MISCELLANEOUS) ×3 IMPLANT
BENZOIN TINCTURE PRP APPL 2/3 (GAUZE/BANDAGES/DRESSINGS) IMPLANT
BLADE CLIPPER SURG (BLADE) IMPLANT
BLADE SURG 11 STRL SS (BLADE) ×3 IMPLANT
BUR MATCHSTICK NEURO 3.0 LAGG (BURR) ×2 IMPLANT
BUR PRECISION FLUTE 5.0 (BURR) ×2 IMPLANT
CANISTER SUCT 3000ML PPV (MISCELLANEOUS) ×3 IMPLANT
CARTRIDGE OIL MAESTRO DRILL (MISCELLANEOUS) ×1 IMPLANT
CLOSURE WOUND 1/2 X4 (GAUZE/BANDAGES/DRESSINGS)
DECANTER SPIKE VIAL GLASS SM (MISCELLANEOUS) ×3 IMPLANT
DERMABOND ADVANCED (GAUZE/BANDAGES/DRESSINGS) ×2
DERMABOND ADVANCED .7 DNX12 (GAUZE/BANDAGES/DRESSINGS) ×1 IMPLANT
DIFFUSER DRILL AIR PNEUMATIC (MISCELLANEOUS) ×3 IMPLANT
DRAPE LAPAROTOMY 100X72X124 (DRAPES) ×3 IMPLANT
DRAPE MICROSCOPE LEICA (MISCELLANEOUS) ×3 IMPLANT
DRAPE SURG 17X23 STRL (DRAPES) ×3 IMPLANT
DRESSING OPSITE X SMALL 2X3 (GAUZE/BANDAGES/DRESSINGS) ×2 IMPLANT
DRSG OPSITE POSTOP 3X4 (GAUZE/BANDAGES/DRESSINGS) ×3 IMPLANT
DURAPREP 26ML APPLICATOR (WOUND CARE) ×3 IMPLANT
ELECT REM PT RETURN 9FT ADLT (ELECTROSURGICAL) ×3
ELECTRODE REM PT RTRN 9FT ADLT (ELECTROSURGICAL) ×1 IMPLANT
GAUZE 4X4 16PLY RFD (DISPOSABLE) IMPLANT
GAUZE SPONGE 4X4 12PLY STRL (GAUZE/BANDAGES/DRESSINGS) IMPLANT
GLOVE BIO SURGEON STRL SZ7 (GLOVE) ×2 IMPLANT
GLOVE BIO SURGEON STRL SZ7.5 (GLOVE) ×4 IMPLANT
GLOVE BIOGEL PI IND STRL 7.0 (GLOVE) IMPLANT
GLOVE BIOGEL PI IND STRL 7.5 (GLOVE) ×1 IMPLANT
GLOVE BIOGEL PI INDICATOR 7.0 (GLOVE)
GLOVE BIOGEL PI INDICATOR 7.5 (GLOVE) ×6
GLOVE ECLIPSE 7.0 STRL STRAW (GLOVE) ×3 IMPLANT
GLOVE EXAM NITRILE LRG STRL (GLOVE) IMPLANT
GLOVE EXAM NITRILE XL STR (GLOVE) IMPLANT
GLOVE EXAM NITRILE XS STR PU (GLOVE) IMPLANT
GOWN STRL REUS W/ TWL LRG LVL3 (GOWN DISPOSABLE) ×2 IMPLANT
GOWN STRL REUS W/ TWL XL LVL3 (GOWN DISPOSABLE) IMPLANT
GOWN STRL REUS W/TWL 2XL LVL3 (GOWN DISPOSABLE) IMPLANT
GOWN STRL REUS W/TWL LRG LVL3 (GOWN DISPOSABLE) ×6
GOWN STRL REUS W/TWL XL LVL3 (GOWN DISPOSABLE)
HEMOSTAT POWDER KIT SURGIFOAM (HEMOSTASIS) ×3 IMPLANT
KIT BASIN OR (CUSTOM PROCEDURE TRAY) ×3 IMPLANT
KIT TURNOVER KIT B (KITS) ×3 IMPLANT
NDL HYPO 18GX1.5 BLUNT FILL (NEEDLE) IMPLANT
NDL SPNL 18GX3.5 QUINCKE PK (NEEDLE) IMPLANT
NEEDLE HYPO 18GX1.5 BLUNT FILL (NEEDLE) ×3 IMPLANT
NEEDLE HYPO 22GX1.5 SAFETY (NEEDLE) ×3 IMPLANT
NEEDLE SPNL 18GX3.5 QUINCKE PK (NEEDLE) IMPLANT
NS IRRIG 1000ML POUR BTL (IV SOLUTION) ×3 IMPLANT
OIL CARTRIDGE MAESTRO DRILL (MISCELLANEOUS) ×3
PACK LAMINECTOMY NEURO (CUSTOM PROCEDURE TRAY) ×3 IMPLANT
PAD ARMBOARD 7.5X6 YLW CONV (MISCELLANEOUS) ×9 IMPLANT
RUBBERBAND STERILE (MISCELLANEOUS) ×6 IMPLANT
SPONGE LAP 4X18 RFD (DISPOSABLE) IMPLANT
SPONGE SURGIFOAM ABS GEL SZ50 (HEMOSTASIS) ×3 IMPLANT
STRIP CLOSURE SKIN 1/2X4 (GAUZE/BANDAGES/DRESSINGS) IMPLANT
SUT VIC AB 0 CT1 18XCR BRD8 (SUTURE) ×1 IMPLANT
SUT VIC AB 0 CT1 8-18 (SUTURE) ×3
SUT VIC AB 2-0 CT1 18 (SUTURE) IMPLANT
SUT VICRYL 3-0 RB1 18 ABS (SUTURE) ×6 IMPLANT
SYR 3ML LL SCALE MARK (SYRINGE) ×2 IMPLANT
TOWEL GREEN STERILE (TOWEL DISPOSABLE) ×3 IMPLANT
TOWEL GREEN STERILE FF (TOWEL DISPOSABLE) ×3 IMPLANT
WATER STERILE IRR 1000ML POUR (IV SOLUTION) ×3 IMPLANT

## 2017-10-07 NOTE — ED Provider Notes (Signed)
[facility] DEPT Provider Note   CSN: [id number] Arrival date & time: 10/07/17  1241     History   Chief Complaint Chief Complaint  Patient presents with  . Back Pain    HPI Tyrone Roman is a 72 y.o. male.  HPI  Patient is a 72 year old male with a history of CAD, MI, hypertension, hyperlipidemia, presenting for worsening right-sided low back pain.  Patient reports that approximately a week ago, he was assisting a friend and lifting a barber chair, and felt pain in his lower back at that time, but did not have increasing pain until yesterday.  Patient reports he had sudden onset pain while he was in bed.  Patient reports he is a difficulty ambulating ever since then due to the pain, and feels the pain in the midline to the right side of his back.  Patient reports that yesterday, he experienced "numbness and tingling" of the right lower abdomen and groin, right testicle, and right calf, however this progressed into numbness and tingling of bilateral testicles today.  Patient reports that he has had urinary urgency, but denies any loss of bowel or bladder control, or numbness in the rectum.  Patient denies any fever, chills, chest pain, shortness of breath, abdominal pain, nausea, or vomiting.  Patient reports he has no history of degenerative disc disease.  Patient denies any history of cancer, immunocompromised status, recent spinal procedures.  Past Medical History:  Diagnosis Date  . Barrett's esophageal ulceration   . BPH (benign prostatic hyperplasia)   . CAD (coronary artery disease)    sees Dr. Mar Daring  . Cataract    both eyes  . Colonic polyp   . Concussion with loss of consciousness of 30 minutes or less   . Contact dermatitis   . Contact with or exposure to venereal diseases   . COPD (chronic obstructive pulmonary disease) ([place])    sees Dr. Kara Mead   . Diverticulosis of colon   . Dysphagia   . GERD (gastroesophageal reflux disease)     . Hyperlipidemia   . Hypertension   . Laceration of scalp   . Myocardial infarction [facility])    2011- stent placed  . Nephrolithiasis   . Pre-operative cardiovascular examination   . Rheumatoid arthritis [facility])    sees Dr. Gavin Pound   . SOB (shortness of breath)     Patient Active Problem List   Diagnosis Date Noted  . Psoriasis of scalp 03/27/2016  . Rheumatoid arthritis ([place]) 03/25/2013  . GI bleeding 02/20/2012  . Dyspnea on exertion 09/08/2010  . COPD (chronic obstructive pulmonary disease) ([place]) 08/04/2010  . Bruising 08/04/2010  . SYNCOPE 02/01/2010  . HEADACHE 01/04/2010  . TINNITUS 12/16/2009  . Dizziness and giddiness 12/16/2009  . NECK SPRAIN AND STRAIN 10/21/2009  . LUMBAR SPRAIN AND STRAIN 10/21/2009  . Hyperlipidemia 06/10/2009  . Coronary artery disease of native artery of native heart with stable angina pectoris ([place]) 06/10/2009  . GERD 06/10/2009  . BARRETTS ESOPHAGUS 06/10/2009  . SHORTNESS OF BREATH 05/31/2009  . CONCUSSION WITH LOC OF 30 MINUTES OR LESS 02/28/2009  . LACERATION, SCALP 02/28/2009  . NEPHROLITHIASIS 07/01/2007  . CONTACT DERMATITIS 12/12/2006  . BPH with urinary obstruction 11/04/2006  . COLONIC POLYPS 10/21/2003  . DIVERTICULOSIS, COLON 10/21/2003    Past Surgical History:  Procedure Laterality Date  . CARDIAC CATHETERIZATION N/A 01/19/2016   Procedure: Left Heart Cath and Coronary Angiography;  Surgeon: Burnell Blanks, MD;  Location:  [facility] INVASIVE CV LAB;  Service: Cardiovascular;  Laterality: N/A;  . COLONOSCOPY  12/09/2014   per Dr. Silverio Decamp, adenomatous polyps, repeat 3 years   . EGD with dilatation  02/17/09   Barretts esophagus   . heart stent    . Left knee surgery    . left knee surgery    . TONSILLECTOMY          Home Medications    Prior to Admission medications   Medication Sig Start Date End Date Taking? Authorizing Provider  aspirin EC 81 MG tablet Take 81 mg by mouth daily.   Yes [provider]  cetirizine (ZYRTEC) 10 MG tablet Take 10 mg by mouth daily.   Yes [provider]  cyclobenzaprine (FLEXERIL) 10 MG tablet Take 1 tablet (10 mg total) by mouth 2 (two) times daily as needed for muscle spasms. 10/06/17  Yes Providence Lanius A, PA-C  diclofenac sodium (VOLTAREN) 1 % GEL Apply 2 g topically 4 (four) times daily. 10/06/17  Yes Volanda Napoleon, PA-C  isosorbide mononitrate (IMDUR) 30 MG 24 hr tablet Take 1 tablet (30 mg total) by mouth daily. 01/26/16 10/07/17 Yes Eileen Stanford, PA-C  leflunomide (ARAVA) 20 MG tablet Take 1 tablet (20 mg total) by mouth 2 (two) times daily. Patient taking differently: Take 20 mg by mouth every other day.  12/05/16  Yes [name], MD  metoprolol succinate (TOPROL-XL) 25 MG 24 hr tablet TAKE 1 TABLET BY MOUTH DAILY Patient taking differently: Take 25 mg by mouth daily.  02/04/17  Yes [name], MD  Multiple Vitamin (MULTIVITAMIN WITH MINERALS) TABS Take 1 tablet by mouth daily.   Yes [provider]  naproxen (NAPROSYN) 500 MG tablet Take 500 mg by mouth 2 (two) times daily. 05/02/16  Yes [provider]  omeprazole (PRILOSEC) 40 MG capsule Take 40 mg by mouth daily.   Yes [provider]  predniSONE (DELTASONE) 5 MG tablet Take 5 mg by mouth daily as needed (arthritis).  09/21/17  Yes [provider]  senna-docusate (SB DOCUSATE SODIUM/SENNA) 8.6-50 MG per tablet Take 1 tablet by mouth daily as needed for moderate constipation.    Yes [provider]  sildenafil (REVATIO) 20 MG tablet Take 20 mg by mouth daily as needed Patient taking differently: Take 20 mg by mouth daily as needed (erectile disfunction).  02/22/17  Yes [name], MD  simvastatin (ZOCOR) 40 MG tablet Take 1 tablet (40 mg total) by mouth daily. 12/05/16  Yes [name], MD  tamsulosin (FLOMAX) 0.4 MG CAPS capsule TAKE ONE CAPSULE BY MOUTH DAILY Patient taking differently: Take 0.4 mg by mouth daily.  05/07/17  Yes  [name], MD  zolpidem (AMBIEN) 10 MG tablet Take 1 tablet (10 mg total) by mouth at bedtime. Patient taking differently: Take 10 mg by mouth at bedtime as needed for sleep.  02/22/17  Yes [name], MD  nitroGLYCERIN (NITROSTAT) 0.4 MG SL tablet Place 1 tablet (0.4 mg total) under the tongue every 5 (five) minutes as needed for chest pain (3 doses max). 12/27/14   Josue Hector, MD    Family History Family History  Problem Relation Age of Onset  . Heart attack Father        cardiovascular disorder  . Arthritis Father        family hx  . Colon cancer Father        mets  . Prostate cancer Father  1st degree relative  . Arthritis Mother   . Diabetes Paternal Uncle   . Colon cancer Paternal Uncle   . Cancer Maternal Grandmother   . Skin cancer Daughter   . Esophageal cancer Neg Hx   . Stomach cancer Neg Hx   . Rectal cancer Neg Hx     Social History Social History   Tobacco Use  . Smoking status: Former Smoker    Packs/day: 1.00    Years: 20.00    Pack years: 20.00    Types: Cigarettes    Last attempt to quit: 10/12/1988    Years since quitting: 29.0  . Smokeless tobacco: Never Used  . Tobacco comment: 1960's   Substance Use Topics  . Alcohol use: Yes    Alcohol/week: 0.0 standard drinks    Comment: rare  . Drug use: No     Allergies   Cephalexin; Clarithromycin; Certolizumab pegol; Doxycycline; Hydroxyzine; Lidoderm; and Pyrithione zinc   Review of Systems Review of Systems  Constitutional: Negative for chills and fever.  HENT: Negative for congestion, rhinorrhea, sinus pain and sore throat.   Eyes: Negative for visual disturbance.  Respiratory: Negative for cough, chest tightness and shortness of breath.   Cardiovascular: Negative for chest pain and leg swelling.  Gastrointestinal: Negative for abdominal pain, constipation, diarrhea, nausea and vomiting.  Genitourinary: Positive for flank pain. Negative for dysuria, scrotal swelling and  testicular pain.  Musculoskeletal: Positive for arthralgias and back pain. Negative for myalgias.  Skin: Negative for rash.  Neurological: Positive for weakness and numbness. Negative for dizziness, syncope, light-headedness and headaches.     Physical Exam Updated Vital Signs BP (!) 143/96 (BP Location: Left Arm)   Pulse 66   Temp (!) 97.4 F (36.3 C) (Rectal)   Resp 17   SpO2 97%   Physical Exam  Constitutional: He appears well-developed and well-nourished. No distress.  HENT:  Head: Normocephalic and atraumatic.  Mouth/Throat: Oropharynx is clear and moist.  Eyes: Pupils are equal, round, and reactive to light. Conjunctivae and EOM are normal.  Neck: Normal range of motion. Neck supple.  Cardiovascular: Normal rate, regular rhythm, S1 normal and S2 normal.  No murmur heard. Pulmonary/Chest: Effort normal and breath sounds normal. He has no wheezes. He has no rales.  Abdominal: Soft. He exhibits no distension. There is no tenderness. There is no guarding.  Musculoskeletal: Normal range of motion.  Spine Exam: Inspection/Palpation: TTP of entire lumbar spine and sacrum midline to right side. Strength: 4/5 throughout RLE bilaterally (hip flexion/extension, adduction/abduction; knee flexion/extension; foot dorsiflexion/plantarflexion, inversion/eversion; great toe inversion), pt reporting due to pain. Strength 5/5 in LLE.  Sensation: Intact to light touch in proximal and distal LE bilaterally Reflexes: 2+ quadriceps and achilles reflexes  Neurological: He is alert.  Cranial nerves grossly intact.  Skin: Skin is warm and dry. No rash noted. No erythema.  Psychiatric: He has a normal mood and affect. His behavior is normal. Judgment and thought content normal.  Nursing note and vitals reviewed.    ED Treatments / Results  Labs (all labs ordered are listed, but only abnormal results are displayed) Labs Reviewed  COMPREHENSIVE METABOLIC PANEL  CBC WITH DIFFERENTIAL/PLATELET    URINALYSIS, ROUTINE W REFLEX MICROSCOPIC    EKG None  Radiology Ct Angio Abd/pel W/ And/or W/o  Result Date: 10/06/2017 CLINICAL DATA:  72 year old male with history of flank pack pain for 1 week, worsening today. Numbness, burning and tingling in the right leg. Urinary frequency. Nausea. EXAM: CTA ABDOMEN AND  PELVIS WITH CONTRAST TECHNIQUE: Multidetector CT imaging of the abdomen and pelvis was performed using the standard protocol during bolus administration of intravenous contrast. Multiplanar reconstructed images and MIPs were obtained and reviewed to evaluate the vascular anatomy. CONTRAST:  144mL ISOVUE-370 IOPAMIDOL (ISOVUE-370) INJECTION 76% COMPARISON:  CT the abdomen and pelvis 05/01/2007. FINDINGS: VASCULAR Aorta: Aortic atherosclerosis (mild). Normal caliber aorta without aneurysm, dissection, vasculitis or significant stenosis. Celiac: Patent without evidence of aneurysm, dissection, vasculitis or significant stenosis. SMA: Patent without evidence of aneurysm, dissection, vasculitis or significant stenosis. Renals: Renal arteries (single right and 2 left) are patent without evidence of aneurysm, dissection, vasculitis, fibromuscular dysplasia or significant stenosis. IMA: Patent without evidence of aneurysm, dissection, vasculitis or significant stenosis. Inflow: Patent without evidence of aneurysm, dissection, vasculitis or significant stenosis. Proximal Outflow: Bilateral common femoral and visualized portions of the superficial and profunda femoral arteries are patent without evidence of aneurysm, dissection, vasculitis or significant stenosis. Veins: No obvious venous abnormality within the limitations of this arterial phase study. Review of the MIP images confirms the above findings. NON-VASCULAR Lower chest: Mild scarring or atelectasis in the medial segment of the right middle lobe. Hepatobiliary: No suspicious cystic or solid hepatic lesions. No intra or extrahepatic biliary ductal  dilatation. Gallbladder is normal in appearance. Pancreas: No pancreatic mass. No pancreatic ductal dilatation. No pancreatic or peripancreatic fluid or inflammatory changes. Spleen: Unremarkable. Adrenals/Urinary Tract: In the interpolar region of the right kidney there is a 3.1 x 4.9 cm low-attenuation lesion, compatible with a simple cyst. 2.8 cm simple cyst in the anterior aspect of the interpolar region of the left kidney. Several nonobstructive calculi are noted within the lower pole collecting system of left kidney measuring up to 7 mm. No ureteral stones. No hydroureteronephrosis. Bilateral adrenal glands are normal in appearance. Urinary bladder is normal in appearance. Stomach/Bowel: Normal appearance of the stomach. No pathologic dilatation of small bowel or colon. Numerous colonic diverticulae are noted, without surrounding inflammatory changes to suggest an acute diverticulitis at this time. Normal appendix. Lymphatic: No lymphadenopathy noted in the abdomen or pelvis. Reproductive: Prostate gland and seminal vesicles are unremarkable in appearance. Other: No significant volume of ascites.  No pneumoperitoneum. Musculoskeletal: There are no aggressive appearing lytic or blastic lesions noted in the visualized portions of the skeleton. IMPRESSION: VASCULAR 1. Mild aortic atherosclerosis, without evidence of aneurysm or dissection in the abdominal or pelvic vasculature. NON-VASCULAR 1. No acute findings are noted in the abdomen or pelvis to account for the patient's symptoms. 2. Several nonobstructive calculi are noted within the lower pole collecting system of left kidney measuring up to 7 mm. No ureteral stones or findings of urinary tract obstruction are noted at this time. 3. Severe colonic diverticulosis without evidence of acute diverticulitis at this time. Electronically Signed   By: Vinnie Langton M.D.   On: 10/06/2017 20:43    Procedures Procedures (including critical care  time)  Medications Ordered in ED Medications  HYDROmorphone (DILAUDID) injection 0.5 mg (0.5 mg Intravenous Given 10/07/17 1411)  HYDROmorphone (DILAUDID) injection 0.5 mg (0.5 mg Intravenous Given 10/07/17 1433)  dexamethasone (DECADRON) injection 10 mg (10 mg Intravenous Given 10/07/17 1433)     Initial Impression / Assessment and Plan / ED Course  I have reviewed the triage vital signs and the nursing notes.  Pertinent labs & imaging results that were available during my care of the patient were reviewed by me and considered in my medical decision making (see chart for details).  Clinical Course  as of Oct 07 1701  Mon Oct 07, 2017  1442 Discussed pain control with Dr. Charlesetta Shanks. Will titrate Dilaudid on pulse oximeter.   [AM]  1603 Attempted to ambulate patient with RN.  Patient was able to stand, and take a couple steps, but then reported shooting pain down his right leg that forced him to sit immediately.   [AM]  7169 Spoke with PA Tomasa Rand, who will evaluate patient at [facility] emergency department.  Appreciate his involvement in the care of this patient.   [AM]  58 Spoke with Dr. Ellender Hose at [facility], who will accept patient.   [AM]  1702 Patient is alert, oriented, with oxygen saturation 97% on room air.  Patient has not received narcotics in >2 hours.  Will hold Valium if patient is being transferred by private vehicle.  Discussed with patient.   [AM]    Clinical Course User Index [AM] Langston Masker B, PA-C    Differential diagnosis includes ruptured disc, cauda equina syndrome, severe sciatica.  Doubt epidural abscess or tumor, patient has no cancer history, and has no infectious symptoms, fever, or abnormal vital signs, and has no history of immune compromise status.  Additionally, doubt aortic dissection, as patient had a normal CT angiogram yesterday when his symptoms started.  Patient is having aggressive neurologic symptoms including symptoms consistent with  saddle anesthesia, will administer 10 mg of intravenous Decadron, and obtain MRI.  4:04 PM MRI reviewed with patient.  Patient with L1-L2 disc bulge with large downturning extension to right subarticular recess, compressing right L2 nerve root.  Suspect this is the cause of patient's symptoms.  No cauda equina.  Will discuss with neurosurgery.  Per discussion with neurosurgery, patient to meet Tomasa Rand, PA-C at [facility] emergency department after transferring by POV.  This is per patient's preference. This was discussed with Dr. Duffy Bruce.  Risks and benefits discussed.  Final Clinical Impressions(s) / ED Diagnoses   Final diagnoses:  Bulge of lumbar disc without myelopathy    ED Discharge Orders    None       Tamala Julian 10/07/17 1708    Charlesetta Shanks, MD 10/24/17 1017

## 2017-10-07 NOTE — ED Triage Notes (Signed)
Per EMS, patient from home, c/o right lower back pain. Seen yesterday for same. Hx sciatica. Reports taking flexeril with no relief. Ambulatory at baseline.

## 2017-10-07 NOTE — ED Notes (Signed)
Patient given water and heat packs.

## 2017-10-07 NOTE — Anesthesia Preprocedure Evaluation (Addendum)
Anesthesia Evaluation  Patient identified by MRN, date of birth, ID band Patient awake    Reviewed: Allergy & Precautions, NPO status , Patient's Chart, lab work & pertinent test results, reviewed documented beta blocker date and time   Airway Mallampati: II  TM Distance: >3 FB Neck ROM: Full    Dental  (+) Teeth Intact, Dental Advisory Given, Caps   Pulmonary shortness of breath, COPD, former smoker,    Pulmonary exam normal breath sounds clear to auscultation       Cardiovascular hypertension, Pt. on home beta blockers and Pt. on medications + CAD, + Past MI and + Cardiac Stents  Normal cardiovascular exam Rhythm:Regular Rate:Normal     Neuro/Psych  Headaches, disc inferiorly migrated right sided disc bulge at L1-2 RLE weakness     GI/Hepatic Neg liver ROS, PUD, GERD  Medicated,  Endo/Other  negative endocrine ROS  Renal/GU negative Renal ROS     Musculoskeletal  (+) Arthritis , Rheumatoid disorders,    Abdominal   Peds  Hematology negative hematology ROS (+)   Anesthesia Other Findings Day of surgery medications reviewed with the patient.  Reproductive/Obstetrics                            Anesthesia Physical Anesthesia Plan  ASA: III and emergent  Anesthesia Plan: General   Post-op Pain Management:    Induction: Intravenous  PONV Risk Score and Plan: 2 and Dexamethasone and Ondansetron  Airway Management Planned: Oral ETT  Additional Equipment:   Intra-op Plan:   Post-operative Plan: Extubation in OR  Informed Consent: I have reviewed the patients History and Physical, chart, labs and discussed the procedure including the risks, benefits and alternatives for the proposed anesthesia with the patient or authorized representative who has indicated his/her understanding and acceptance.   Dental advisory given  Plan Discussed with: CRNA  Anesthesia Plan Comments:          Anesthesia Quick Evaluation

## 2017-10-07 NOTE — ED Notes (Signed)
Patient advised to go to [facility] ED by POV. IV in place. Patient contracts for safety. Report called to [facility] ED.

## 2017-10-07 NOTE — ED Provider Notes (Signed)
Medical screening examination/treatment/procedure(s) were conducted as a shared visit with non-physician practitioner(s) and myself.  I personally evaluated the patient during the encounter.  None Patient has severe lower back pain which she indicates is in the central sacral area area.  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  He did feel a pop in his back but initially it was not too severe.  Over the past week now he is gotten increasingly severe pain that includes burning and radiating into his left buttock and down the back of his leg.  He has gotten tingling sensations and numbness that have progressed over the past 2 days.  Patient is alert and appropriate.  He does appear to be in severe pain.  He is lying on his right side.  No respiratory distress.  Lungs clear to auscultation.  Abdomen soft and nontender.  Back positive reproducible pain in the midline from about L4 to the S1.  Pain exquisitely reproduced by any movement of the leg or lower back.  Patient does not have any peripheral edema.  Skin condition excellent.  Dorsalis pedis pulses are 2+ and symmetric.  Patient has had progressive paresthesia and numbness with debilitating pain.  At this time will need to proceed with MRI of the lumbosacral spine.  Will also administer Decadron and continue with Dilaudid as needed for pain control.  I agree with plan of management.   Charlesetta Shanks, MD 10/07/17 1425

## 2017-10-07 NOTE — Anesthesia Postprocedure Evaluation (Signed)
Anesthesia Post Note  Patient: NEVYN BOSSMAN  Procedure(s) Performed: RIGHT LUMBAR ONE-TWO LAMINECTOMY WITH MICRODISCECTOMY (Right Back)     Patient location during evaluation: PACU Anesthesia Type: General Level of consciousness: awake and alert, oriented and awake Pain management: pain level controlled Vital Signs Assessment: post-procedure vital signs reviewed and stable Respiratory status: spontaneous breathing, nonlabored ventilation and respiratory function stable Cardiovascular status: blood pressure returned to baseline and stable Postop Assessment: no apparent nausea or vomiting Anesthetic complications: no    Last Vitals:  Vitals:   10/07/17 2115 10/07/17 2131  BP: (!) 162/94 (!) 161/94  Pulse: 80 82  Resp: 14 13  Temp:    SpO2: 97% 93%    Last Pain:  Vitals:   10/07/17 2115  TempSrc:   PainSc: [place] Edward Ermin Parisien

## 2017-10-07 NOTE — ED Notes (Signed)
PA made aware of delay in EKG.

## 2017-10-07 NOTE — H&P (Signed)
Chief Complaint   Chief Complaint  Patient presents with  . Back Pain    HPI   HPI: [name] is a 72 y.o. male who presents to ER due to severe low back pain. Last week he did pick up a barber chair which caused a "twinge" in his back and pain has progressed since. Pain currently affects right lower back and radiates down right lower extremity. Pain is improved with crossing right leg over left. Intense pains when standing and therefor unable to ambulate due to significant pain. Endorses perineal tingling but denies bowel/bladder dysfunction. Has urinated since being in the ER. Denies incomplete void.   PMH significant for COPD, MI with stent roughly 6 years ago (now on ASA 74m daily), RA and hyperlipidemia.  His last meal was roughly 7pm. He has had water today but no food/other drink.  Patient Active Problem List   Diagnosis Date Noted  . Psoriasis of scalp 03/27/2016  . Rheumatoid arthritis ([facility] 03/25/2013  . GI bleeding 02/20/2012  . Dyspnea on exertion 09/08/2010  . COPD (chronic obstructive pulmonary disease) ([facility] 08/04/2010  . Bruising 08/04/2010  . SYNCOPE 02/01/2010  . HEADACHE 01/04/2010  . TINNITUS 12/16/2009  . Dizziness and giddiness 12/16/2009  . NECK SPRAIN AND STRAIN 10/21/2009  . LUMBAR SPRAIN AND STRAIN 10/21/2009  . Hyperlipidemia 06/10/2009  . Coronary artery disease of native artery of native heart with stable angina pectoris ([facility] 06/10/2009  . GERD 06/10/2009  . BARRETTS ESOPHAGUS 06/10/2009  . SHORTNESS OF BREATH 05/31/2009  . CONCUSSION WITH LOC OF 30 MINUTES OR LESS 02/28/2009  . LACERATION, SCALP 02/28/2009  . NEPHROLITHIASIS 07/01/2007  . CONTACT DERMATITIS 12/12/2006  . BPH with urinary obstruction 11/04/2006  . COLONIC POLYPS 10/21/2003  . DIVERTICULOSIS, COLON 10/21/2003    PMH: Past Medical History:  Diagnosis Date  . Barrett's esophageal ulceration   . BPH (benign prostatic hyperplasia)   . CAD (coronary artery disease)     sees Dr. TMar Daring . Cataract    both eyes  . Colonic polyp   . Concussion with loss of consciousness of 30 minutes or less   . Contact dermatitis   . Contact with or exposure to venereal diseases   . COPD (chronic obstructive pulmonary disease) ([facility]    sees Dr. RKara Mead  . Diverticulosis of colon   . Dysphagia   . GERD (gastroesophageal reflux disease)   . Hyperlipidemia   . Hypertension   . Laceration of scalp   . Myocardial infarction ([facility]    2011- stent placed  . Nephrolithiasis   . Pre-operative cardiovascular examination   . Rheumatoid arthritis ([facility]    sees Dr. AGavin Pound  . SOB (shortness of breath)     PSH: Past Surgical History:  Procedure Laterality Date  . CARDIAC CATHETERIZATION N/A 01/19/2016   Procedure: Left Heart Cath and Coronary Angiography;  Surgeon: CBurnell Blanks MD;  [facility];  Service: Cardiovascular;  Laterality: N/A;  . COLONOSCOPY  12/09/2014   per Dr. NSilverio Decamp adenomatous polyps, repeat 3 years   . EGD with dilatation  02/17/09   Barretts esophagus   . heart stent    . Left knee surgery    . left knee surgery    . TONSILLECTOMY       (Not in a hospital admission)  SH: Social History   Tobacco Use  . Smoking status: Former Smoker    Packs/day: 1.00    Years: 20.00  Pack years: 20.00    Types: Cigarettes    Last attempt to quit: 10/12/1988    Years since quitting: 29.0  . Smokeless tobacco: Never Used  . Tobacco comment: 1960's   Substance Use Topics  . Alcohol use: Yes    Alcohol/week: 0.0 standard drinks    Comment: rare  . Drug use: No    MEDS: Prior to Admission medications   Medication Sig Start Date End Date Taking? Authorizing Provider  aspirin EC 81 MG tablet Take 81 mg by mouth daily.   Yes [provider]  cetirizine (ZYRTEC) 10 MG tablet Take 10 mg by mouth daily.   Yes [provider]  cyclobenzaprine (FLEXERIL) 10 MG tablet Take 1 tablet (10 mg total) by  mouth 2 (two) times daily as needed for muscle spasms. 10/06/17  Yes Providence Lanius A, PA-C  diclofenac sodium (VOLTAREN) 1 % GEL Apply 2 g topically 4 (four) times daily. 10/06/17  Yes Volanda Napoleon, PA-C  isosorbide mononitrate (IMDUR) 30 MG 24 hr tablet Take 1 tablet (30 mg total) by mouth daily. 01/26/16 10/07/17 Yes Eileen Stanford, PA-C  leflunomide (ARAVA) 20 MG tablet Take 1 tablet (20 mg total) by mouth 2 (two) times daily. Patient taking differently: Take 20 mg by mouth every other day.  12/05/16  Yes [name], MD  metoprolol succinate (TOPROL-XL) 25 MG 24 hr tablet TAKE 1 TABLET BY MOUTH DAILY Patient taking differently: Take 25 mg by mouth daily.  02/04/17  Yes [name], MD  Multiple Vitamin (MULTIVITAMIN WITH MINERALS) TABS Take 1 tablet by mouth daily.   Yes [provider]  naproxen (NAPROSYN) 500 MG tablet Take 500 mg by mouth 2 (two) times daily. 05/02/16  Yes [provider]  omeprazole (PRILOSEC) 40 MG capsule Take 40 mg by mouth daily.   Yes [provider]  predniSONE (DELTASONE) 5 MG tablet Take 5 mg by mouth daily as needed (arthritis).  09/21/17  Yes [provider]  senna-docusate (SB DOCUSATE SODIUM/SENNA) 8.6-50 MG per tablet Take 1 tablet by mouth daily as needed for moderate constipation.    Yes [provider]  sildenafil (REVATIO) 20 MG tablet Take 20 mg by mouth daily as needed Patient taking differently: Take 20 mg by mouth daily as needed (erectile disfunction).  02/22/17  Yes [name], MD  simvastatin (ZOCOR) 40 MG tablet Take 1 tablet (40 mg total) by mouth daily. 12/05/16  Yes [name], MD  tamsulosin (FLOMAX) 0.4 MG CAPS capsule TAKE ONE CAPSULE BY MOUTH DAILY Patient taking differently: Take 0.4 mg by mouth daily.  05/07/17  Yes [name], MD  zolpidem (AMBIEN) 10 MG tablet Take 1 tablet (10 mg total) by mouth at bedtime. Patient taking differently: Take 10 mg by mouth at bedtime as needed  for sleep.  02/22/17  Yes [name], MD  nitroGLYCERIN (NITROSTAT) 0.4 MG SL tablet Place 1 tablet (0.4 mg total) under the tongue every 5 (five) minutes as needed for chest pain (3 doses max). 12/27/14   Josue Hector, MD    ALLERGY: Allergies  Allergen Reactions  . Cephalexin Shortness Of Breath and Rash    Has patient had a PCN reaction causing immediate rash, facial/tongue/throat swelling, SOB or lightheadedness with hypotension: Yes Has patient had a PCN reaction causing severe rash involving mucus membranes or skin necrosis: No Has patient had a PCN reaction that required hospitalization: No Has patient had a PCN reaction occurring  within the last 10 years: No If all of the above answers are "NO", then may proceed with Cephalosporin use.   . Clarithromycin Shortness Of Breath and Rash  . Certolizumab Pegol     Hives   . Doxycycline Rash  . Hydroxyzine Rash  . Lidoderm Other (See Comments)    "made me act weird"  . Pyrithione Zinc Rash    Social History   Tobacco Use  . Smoking status: Former Smoker    Packs/day: 1.00    Years: 20.00    Pack years: 20.00    Types: Cigarettes    Last attempt to quit: 10/12/1988    Years since quitting: 29.0  . Smokeless tobacco: Never Used  . Tobacco comment: 1960's   Substance Use Topics  . Alcohol use: Yes    Alcohol/week: 0.0 standard drinks    Comment: rare     Family History  Problem Relation Age of Onset  . Heart attack Father        cardiovascular disorder  . Arthritis Father        family hx  . Colon cancer Father        mets  . Prostate cancer Father        1st degree relative  . Arthritis Mother   . Diabetes Paternal Uncle   . Colon cancer Paternal Uncle   . Cancer Maternal Grandmother   . Skin cancer Daughter   . Esophageal cancer Neg Hx   . Stomach cancer Neg Hx   . Rectal cancer Neg Hx      ROS   Review of Systems  Constitutional: Positive for diaphoresis. Negative for chills, fever and  malaise/fatigue.  HENT: Negative.   Eyes: Negative.   Respiratory: Negative.   Gastrointestinal: Negative.   Genitourinary: Negative.   Musculoskeletal: Positive for back pain and myalgias. Negative for falls, joint pain and neck pain.  Skin: Negative.   Neurological: Positive for tingling and weakness (RLE). Negative for dizziness, tremors, sensory change, speech change, focal weakness, seizures, loss of consciousness and headaches.    Exam   Vitals:   10/07/17 1430 10/07/17 1713  BP: (!) 143/96 (!) 159/101  Pulse: 66 80  Resp: 17 12  Temp:    SpO2: 97% 96%   General appearance: WDWN, resting comfortably Eyes: PERRL, Fundoscopic: normal Cardiovascular: Regular rate and rhythm without murmurs, rubs, gallops. No edema or variciosities. Distal pulses normal. Pulmonary: Clear to auscultation Musculoskeletal:     Muscle tone upper extremities: Normal    Muscle tone lower extremities: Normal    Motor exam: Upper Extremities Deltoid Bicep Tricep Grip  Right 5/5 5/5 5/5 5/5  Left 5/5 5/5 5/5 5/5   Lower Extremity IP Quad PF DF EHL  Right 4/5 4/5 5/5 5/5 5/5  Left 5/5 5/5 5/5 5/5 5/5   Neurological Awake, alert, oriented Memory and concentration grossly intact Speech fluent, appropriate CNII: Visual fields normal CNIII/IV/VI: EOMI CNV: Facial sensation normal CNVII: Symmetric, normal strength CNVIII: Grossly normal CNIX: Normal palate movement CNXI: Trap and SCM strength normal CN XII: Tongue protrusion normal Sensation grossly intact to LT DTR: Normal Coordination (finger/nose & heel/shin): Normal  Results - Imaging/Labs   Results for orders placed or performed during the hospital encounter of 10/07/17 (from the past 48 hour(s))  Comprehensive metabolic panel     Status: Abnormal   Collection Time: 10/07/17  2:13 PM  Result Value Ref Range   Sodium 141 135 - 145 mmol/L   Potassium 4.2 3.5 -  5.1 mmol/L   Chloride 109 98 - 111 mmol/L   CO2 24 22 - 32 mmol/L    Glucose, Bld 112 (H) 70 - 99 mg/dL   BUN 15 8 - 23 mg/dL   Creatinine, Ser 0.94 0.61 - 1.24 mg/dL   Calcium 10.2 8.9 - 10.3 mg/dL   Total Protein 7.1 6.5 - 8.1 g/dL   Albumin 3.9 3.5 - 5.0 g/dL   AST 27 15 - 41 U/L   ALT 21 0 - 44 U/L   Alkaline Phosphatase 34 (L) 38 - 126 U/L   Total Bilirubin 1.0 0.3 - 1.2 mg/dL   GFR calc non Af Amer >60 >60 mL/min   GFR calc Af Amer >60 >60 mL/min    Comment: (NOTE) The eGFR has been calculated using the CKD EPI equation. This calculation has not been validated in all clinical situations. eGFR's persistently <60 mL/min signify possible Chronic Kidney Disease.    Anion gap 8 5 - 15    Comment: Performed at [facility], [place] [address] [place], [place] [address]  CBC with Differential     Status: None   Collection Time: 10/07/17  2:13 PM  Result Value Ref Range   WBC 9.2 4.0 - 10.5 K/uL   RBC 4.89 4.22 - 5.81 MIL/uL   Hemoglobin 14.8 13.0 - 17.0 g/dL   HCT 43.4 39.0 - 52.0 %   MCV 88.8 78.0 - 100.0 fL   MCH 30.3 26.0 - 34.0 pg   MCHC 34.1 30.0 - 36.0 g/dL   RDW 13.7 11.5 - 15.5 %   Platelets 169 150 - 400 K/uL   Neutrophils Relative % 75 %   Neutro Abs 7.0 1.7 - 7.7 K/uL   Lymphocytes Relative 11 %   Lymphs Abs 1.0 0.7 - 4.0 K/uL   Monocytes Relative 9 %   Monocytes Absolute 0.8 0.1 - 1.0 K/uL   Eosinophils Relative 4 %   Eosinophils Absolute 0.3 0.0 - 0.7 K/uL   Basophils Relative 1 %   Basophils Absolute 0.1 0.0 - 0.1 K/uL    Comment: Performed at [facility], [place] [address] [place], [place] [address]  Urinalysis, Routine w reflex microscopic     Status: Abnormal   Collection Time: 10/07/17  2:14 PM  Result Value Ref Range   Color, Urine YELLOW YELLOW   APPearance CLEAR CLEAR   Specific Gravity, Urine 1.023 1.005 - 1.030   pH 7.0 5.0 - 8.0   Glucose, UA NEGATIVE NEGATIVE mg/dL   Hgb urine dipstick NEGATIVE NEGATIVE   Bilirubin Urine NEGATIVE NEGATIVE   Ketones, ur 5 (A) NEGATIVE mg/dL    Protein, ur NEGATIVE NEGATIVE mg/dL   Nitrite NEGATIVE NEGATIVE   Leukocytes, UA NEGATIVE NEGATIVE    Comment: Performed at [place] [address] [place], [place] [address]    Mr Lumbar Spine Wo Contrast  Result Date: 10/07/2017 CLINICAL DATA:  Severe low back pain with right leg numbness and tingling since a lifting injury in the past week. Initial encounter. EXAM: MRI LUMBAR SPINE WITHOUT CONTRAST TECHNIQUE: Multiplanar, multisequence MR imaging of the lumbar spine was performed. No intravenous contrast was administered. COMPARISON:  CT abdomen and pelvis 10/06/2017. FINDINGS: Segmentation:  Standard. Alignment:  Trace degenerative retrolisthesis L1 on L2 noted. Vertebrae: Scattered Schmorl's nodes. No fracture or worrisome lesion. Conus medullaris and cauda equina: Conus extends to the L1-2 level. Conus and cauda equina appear normal. Paraspinal and other soft tissues: T2 hyperintense lesions  in both kidneys are likely cysts. Disc levels: T11-12 is imaged in the sagittal plane only. There is a minimal bulge without stenosis. T12-L1: Shallow bulge without stenosis. L1-2: Shallow to moderate broad-based disc bulge is seen. There is a superimposed large disc extrusion with caudal extension in the right subarticular recess. Extruded disc fragment measures 2 cm craniocaudal x 1 cm AP x 1.3 cm transverse. The disc compresses the descending right L2 root. There is moderate central canal stenosis overall at this level. The foramina are mildly narrowed. L2-3: Very shallow disc bulge with some ligamentum flavum thickening cause mild central canal narrowing. The foramina are open. L3-4: Disc bulge and ligamentum flavum thickening cause moderate to moderately severe central canal stenosis and narrowing in both subarticular recesses which could impact either descending L4 root. Mild to moderate bilateral foraminal narrowing at this level appears worse on the right. L4-5: Shallow disc bulge  with an annular fissure results in mild central canal stenosis and some narrowing in the subarticular recesses. Mild to moderate bilateral foraminal narrowing is also seen. No nerve root compression is identified. L5-S1: Shallow broad-based disc bulge without central canal or foraminal stenosis. IMPRESSION: Disc bulge with a superimposed large down turning extrusion in the right subarticular recess at L1-2 results in compression of the descending right L2 root. There is moderate central canal stenosis overall at this level. Moderate to moderately severe central canal stenosis with narrowing of both subarticular recesses at L3-4. Either descending L4 root could be affected. Mild central canal and bilateral subarticular recess narrowing at L4-5. Mild to moderate bilateral foraminal narrowing is also present at this level. Electronically Signed   By: Inge Rise M.D.   On: 10/07/2017 15:24   Ct Angio Abd/pel W/ And/or W/o  Result Date: 10/06/2017 CLINICAL DATA:  72 year old male with history of flank pack pain for 1 week, worsening today. Numbness, burning and tingling in the right leg. Urinary frequency. Nausea. EXAM: CTA ABDOMEN AND PELVIS WITH CONTRAST TECHNIQUE: Multidetector CT imaging of the abdomen and pelvis was performed using the standard protocol during bolus administration of intravenous contrast. Multiplanar reconstructed images and MIPs were obtained and reviewed to evaluate the vascular anatomy. CONTRAST:  168m ISOVUE-370 IOPAMIDOL (ISOVUE-370) INJECTION 76% COMPARISON:  CT the abdomen and pelvis 05/01/2007. FINDINGS: VASCULAR Aorta: Aortic atherosclerosis (mild). Normal caliber aorta without aneurysm, dissection, vasculitis or significant stenosis. Celiac: Patent without evidence of aneurysm, dissection, vasculitis or significant stenosis. SMA: Patent without evidence of aneurysm, dissection, vasculitis or significant stenosis. Renals: Renal arteries (single right and 2 left) are patent without  evidence of aneurysm, dissection, vasculitis, fibromuscular dysplasia or significant stenosis. IMA: Patent without evidence of aneurysm, dissection, vasculitis or significant stenosis. Inflow: Patent without evidence of aneurysm, dissection, vasculitis or significant stenosis. Proximal Outflow: Bilateral common femoral and visualized portions of the superficial and profunda femoral arteries are patent without evidence of aneurysm, dissection, vasculitis or significant stenosis. Veins: No obvious venous abnormality within the limitations of this arterial phase study. Review of the MIP images confirms the above findings. NON-VASCULAR Lower chest: Mild scarring or atelectasis in the medial segment of the right middle lobe. Hepatobiliary: No suspicious cystic or solid hepatic lesions. No intra or extrahepatic biliary ductal dilatation. Gallbladder is normal in appearance. Pancreas: No pancreatic mass. No pancreatic ductal dilatation. No pancreatic or peripancreatic fluid or inflammatory changes. Spleen: Unremarkable. Adrenals/Urinary Tract: In the interpolar region of the right kidney there is a 3.1 x 4.9 cm low-attenuation lesion, compatible with a simple cyst. 2.8 cm  simple cyst in the anterior aspect of the interpolar region of the left kidney. Several nonobstructive calculi are noted within the lower pole collecting system of left kidney measuring up to 7 mm. No ureteral stones. No hydroureteronephrosis. Bilateral adrenal glands are normal in appearance. Urinary bladder is normal in appearance. Stomach/Bowel: Normal appearance of the stomach. No pathologic dilatation of small bowel or colon. Numerous colonic diverticulae are noted, without surrounding inflammatory changes to suggest an acute diverticulitis at this time. Normal appendix. Lymphatic: No lymphadenopathy noted in the abdomen or pelvis. Reproductive: Prostate gland and seminal vesicles are unremarkable in appearance. Other: No significant volume of  ascites.  No pneumoperitoneum. Musculoskeletal: There are no aggressive appearing lytic or blastic lesions noted in the visualized portions of the skeleton. IMPRESSION: VASCULAR 1. Mild aortic atherosclerosis, without evidence of aneurysm or dissection in the abdominal or pelvic vasculature. NON-VASCULAR 1. No acute findings are noted in the abdomen or pelvis to account for the patient's symptoms. 2. Several nonobstructive calculi are noted within the lower pole collecting system of left kidney measuring up to 7 mm. No ureteral stones or findings of urinary tract obstruction are noted at this time. 3. Severe colonic diverticulosis without evidence of acute diverticulitis at this time. Electronically Signed   By: Vinnie Langton M.D.   On: 10/06/2017 20:43    Impression/Plan   72 y.o. male with acute back pain after lifting injury. He is neurologically intact with the exception of RLE weakness at hip and knee which appears more pain mediated than focal.  MRI lumbar spine ordered in ED and significant for large disc inferiorly migrated right sided disc bulge at L1-2. This results in L2 nerve root impingement. He has been treatment with multiple po medications while in the ER including benzos, narcotics and decadron without relief. Based on size of disc bulge and significant pain, it was rec that patient undergo surgical intervention in form of L1-2 laminectomy for microdiscetomy. Risks, benefits and alternatives to procedure were discussed. Patient states in own language understanding and wishes to proceed. His last meal was yesterday at dinner time. Will proceed today.

## 2017-10-07 NOTE — Op Note (Signed)
  NEUROSURGERY OPERATIVE NOTE   PREOP DIAGNOSIS: Lumbar disc herniation, L1-2  POSTOP DIAGNOSIS: Same  PROCEDURE: 1. Right L1-2 laminotomy and microdiscectomy for decompression of nerve root 2. Use of operating microscope  SURGEON: Dr. Consuella Lose, MD  ASSISTANT: Ferne Reus, PA-C  ANESTHESIA: General Endotracheal  EBL: 50cc  SPECIMENS: None  DRAINS: None  COMPLICATIONS: None immediate  CONDITION: Hemodynamically stable to PACU  HISTORY: Tyrone Roman is a 72 y.o. male presenting to the emergency department with severe right-sided back and leg pain consistent with upper lumbar radiculopathy.  His MRI did demonstrate large inferiorly migrated L1 to disc herniation with compression of the right L2 nerve root.  Multiple attempts were made to control the patient's pain with medication however these were unsuccessful and he remained unable to ambulate because of the pain.  He therefore elected to proceed with surgical decompression.  The risks and benefits of the surgery were explained in detail to the patient and his wife.  After all questions were answered informed consent was obtained and witnessed.  PROCEDURE IN DETAIL: The patient was brought to the operating room. After induction of general anesthesia, the patient was positioned on the operative table in the prone position with all pressure points meticulously padded. The skin of the low back was then prepped and draped in the usual sterile fashion.  Under xray, the correct level was identified and marked out on the skin, and after timeout was conducted, the skin was infiltrated with local anesthetic. Skin incision was then made sharply and Bovie electrocautery was used to dissect the subcutaneous tissue until the lumbodorsal fascia was identified. The fascia was then incised using Bovie electrocautery and the lamina at the L1 and L2 levels was identified and dissection was carried out in the subperiosteal plane.  Self-retaining retractor was then placed, and intraoperative x-ray was taken to confirm we were at the correct level.  Using a high-speed drill, laminotomy was completed with a partial medial facetectomy to include the inferior L1 and superior L2 lamina. The ligamentum flavum was then identified and removed and the lateral edge of the thecal sac was identified.  Blunt dissector was then used to dissect in the ventral epidural space.  The L1-2 disc space was identified, and working inferiorly I did identify a fairly large disc herniation.  This was removed piecemeal using a combination of dissectors and rongeurs.  I was then easily able to palpate the medial wall of the right L2 pedicle, and pass a dissector underneath the L2 nerve root out the foramen indicating good decompression.  Visual inspection did not identify any remaining free disc fragments.  Hemostasis was then secured using a combination of morcellized Gelfoam and thrombin and bipolar electrocautery. The wound is irrigated with copious amounts of antibiotic saline irrigation. The nerve root was then covered with a long-acting steroid solution. Self-retaining retractor was then removed, and the wound is closed in layers using a combination of interrupted 0 Vicryl and 3-0 Vicryl stitches. The skin was closed using standard skin glue.  At the end of the case all sponge, needle, and instrument counts were correct. The patient was then transferred to the stretcher and taken to the postanesthesia care unit in stable hemodynamic condition.

## 2017-10-07 NOTE — ED Notes (Signed)
Patient unable to lay on back at this time for EKG. Will attempt after additional pain medication is administered.

## 2017-10-07 NOTE — ED Notes (Signed)
Patient transported to MRI 

## 2017-10-07 NOTE — ED Notes (Signed)
Report called to [facility].

## 2017-10-07 NOTE — Anesthesia Procedure Notes (Signed)
Procedure Name: Intubation Date/Time: 10/07/2017 7:32 PM Performed by: Myna Bright, CRNA Pre-anesthesia Checklist: Patient identified, Emergency Drugs available, Suction available and Patient being monitored Patient Re-evaluated:Patient Re-evaluated prior to induction Oxygen Delivery Method: Circle system utilized Preoxygenation: Pre-oxygenation with 100% oxygen Induction Type: IV induction Ventilation: Mask ventilation without difficulty Laryngoscope Size: Mac and 4 Grade View: Grade I Tube type: Oral Tube size: 7.5 mm Number of attempts: 1 Airway Equipment and Method: Stylet Placement Confirmation: ETT inserted through vocal cords under direct vision,  positive ETCO2 and breath sounds checked- equal and bilateral Secured at: 21 cm Tube secured with: Tape Dental Injury: Teeth and Oropharynx as per pre-operative assessment

## 2017-10-07 NOTE — Progress Notes (Signed)
Pt admitted from PACU post op, alert and oriented, c/o of pain on the right thigh, pt settled in bed with call light within reach, safety concern addressed accordingly, was however reassured and will continue to monitor, v/s stable. Obasogie-Asidi, Marlyss Cissell Efe

## 2017-10-07 NOTE — Discharge Instructions (Signed)
You are going over to [facility] emergency department to be evaluated by Tomasa Rand, PA-C of neurosurgery, who I have spoken with personally on the phone in addition to the providers at [facility] emergency department. Any questions can be directed to my extension at [phone].  Risks of traveling by private vehicle include vascular injury, worsening of condition on transfer.  Should you experience any new or worsening symptoms, or concerns at any point, please call 911 immediately.

## 2017-10-07 NOTE — ED Notes (Signed)
Patient c/o headache during administration of pain medication. MD at bedside.

## 2017-10-08 ENCOUNTER — Encounter (HOSPITAL_COMMUNITY): Payer: Self-pay | Admitting: Neurosurgery

## 2017-10-08 ENCOUNTER — Other Ambulatory Visit: Payer: Self-pay

## 2017-10-08 LAB — BASIC METABOLIC PANEL
Anion gap: 6 (ref 5–15)
BUN: 19 mg/dL (ref 8–23)
CO2: 24 mmol/L (ref 22–32)
Calcium: 9.5 mg/dL (ref 8.9–10.3)
Chloride: 108 mmol/L (ref 98–111)
Creatinine, Ser: 1.03 mg/dL (ref 0.61–1.24)
GFR calc Af Amer: >60 mL/min (ref >60)
GFR calc non Af Amer: >60 mL/min (ref >60)
Glucose, Bld: 138 mg/dL — ABNORMAL HIGH (ref 70–99)
Potassium: 4.4 mmol/L (ref 3.5–5.1)
Sodium: 138 mmol/L (ref 135–145)

## 2017-10-08 LAB — CBC
HCT: 40.8 % (ref 39.0–52.0)
Hemoglobin: 13.4 g/dL (ref 13.0–17.0)
MCH: 29.6 pg (ref 26.0–34.0)
MCHC: 32.8 g/dL (ref 30.0–36.0)
MCV: 90.3 fL (ref 78.0–100.0)
Platelets: 159 10*3/uL (ref 150–400)
RBC: 4.52 MIL/uL (ref 4.22–5.81)
RDW: 13.5 % (ref 11.5–15.5)
WBC: 13 10*3/uL — ABNORMAL HIGH (ref 4.0–10.5)

## 2017-10-08 LAB — PROTIME-INR
INR: 1.09
Prothrombin Time: 14 seconds (ref 11.4–15.2)

## 2017-10-08 LAB — APTT: aPTT: 29 seconds (ref 24–36)

## 2017-10-08 MED ORDER — METHOCARBAMOL 500 MG PO TABS: 500.0000 mg | ORAL_TABLET | Freq: Four times a day (QID) | ORAL | 2 refills | Status: DC | PRN

## 2017-10-08 MED ORDER — OXYCODONE-ACETAMINOPHEN 7.5-325 MG PO TABS: 1.0000 | ORAL_TABLET | ORAL | 0 refills | Status: DC | PRN

## 2017-10-08 MED ORDER — ASPIRIN EC 81 MG PO TBEC: 81.0000 mg | DELAYED_RELEASE_TABLET | Freq: Every day | ORAL | Status: DC

## 2017-10-08 NOTE — Evaluation (Signed)
Occupational Therapy Evaluation Patient Details Name: Tyrone Roman MRN: [id number] DOB: 1946/01/14 Today's Date: 10/08/2017    History of Present Illness Patient is a 72 y.o male who presents s/p: Right L1-2 laminotomy and microdiscectomy   Clinical Impression   Patient evaluated by Occupational Therapy with no further acute OT needs identified. Educated concerning post-operative back precautions and compensatory ADL strategies. Pt is able to complete all ADL and ADL transfers with modified independence at this time. All education has been completed and the patient has no further questions. See below for any follow-up Occupational Therapy or equipment needs. OT to sign off. Thank you for referral.      Follow Up Recommendations  No OT follow up    Equipment Recommendations  None recommended by OT    Recommendations for Other Services       Precautions / Restrictions Precautions Precautions: Back Precaution Booklet Issued: Yes (comment) Precaution Comments: Reviewed back precautions related to ADL participation.  Restrictions Weight Bearing Restrictions: No      Mobility Bed Mobility Overal bed mobility: Needs Assistance Bed Mobility: Sidelying to Sit;Sit to Sidelying Rolling: Supervision Sidelying to sit: Modified independent (Device/Increase time)     Sit to sidelying: Modified independent (Device/Increase time) General bed mobility comments: Educated concerning log roll technique.   Transfers Overall transfer level: Independent                    Balance Overall balance assessment: Independent                                         ADL either performed or assessed with clinical judgement   ADL Overall ADL's : Modified independent                                       General ADL Comments: Educated concerning all back precautions and compensatory strategies to complete dressing, grooming, and bathing tasks. Pt able to  complete all ADL independently. Educated concerning activity progression as well as bed mobility techniques.      Vision Patient Visual Report: No change from baseline Vision Assessment?: No apparent visual deficits     Perception     Praxis      Pertinent Vitals/Pain Pain Assessment: No/denies pain     Hand Dominance Right   Extremity/Trunk Assessment Upper Extremity Assessment Upper Extremity Assessment: Overall WFL for tasks assessed   Lower Extremity Assessment Lower Extremity Assessment: Overall WFL for tasks assessed   Cervical / Trunk Assessment Cervical / Trunk Assessment: Other exceptions Cervical / Trunk Exceptions: s/p spinal surgery   Communication Communication Communication: No difficulties   Cognition Arousal/Alertness: Awake/alert Behavior During Therapy: WFL for tasks assessed/performed Overall Cognitive Status: Within Functional Limits for tasks assessed                                     General Comments       Exercises     Shoulder Instructions      Home Living Family/patient expects to be discharged to:: Private residence Living Arrangements: Alone   Type of Home: House Home Access: Stairs to enter [employer] of Steps: 2 and 7 Entrance Stairs-Rails: Right Home Layout: Two level;Able to live  on main level with bedroom/bathroom Alternate Level Stairs-Number of Steps: flight   Bathroom Shower/Tub: Occupational psychologist: Handicapped height     Home Equipment: Hand held shower head          Prior Functioning/Environment Level of Independence: Independent        Comments: Has a cane but does not use it        OT Problem List: Decreased activity tolerance;Pain      OT Treatment/Interventions:      OT Goals(Current goals can be found in the care plan section) Acute Rehab OT Goals Patient Stated Goal: "to get home today" OT Goal Formulation: With patient  OT Frequency:     Barriers  to D/C:            Co-evaluation              AM-PAC PT "6 Clicks" Daily Activity     Outcome Measure Help from another person eating meals?: None Help from another person taking care of personal grooming?: None Help from another person toileting, which includes using toliet, bedpan, or urinal?: None Help from another person bathing (including washing, rinsing, drying)?: None Help from another person to put on and taking off regular upper body clothing?: None Help from another person to put on and taking off regular lower body clothing?: None 6 Click Score: 24   End of Session    Activity Tolerance: Patient tolerated treatment well Patient left: in bed;with call bell/phone within reach(seated at EOB)  OT Visit Diagnosis: Other abnormalities of gait and mobility (R26.89)                Time: 2248-2500 OT Time Calculation (min): 14 min Charges:  OT General Charges $OT Visit: 1 Visit OT Evaluation $OT Eval Low Complexity: [place], OTR/L [place] Pager [phone] Office [place] A Jannat Rosemeyer 10/08/2017, 9:45 AM

## 2017-10-08 NOTE — Care Management Note (Addendum)
Case Management Note  Patient Details  Name: Tyrone Roman MRN: [id number] Date of Birth: Mar 01, 1945  Subjective/Objective:     Pt s/p lumbar surgery. PT is from home.                Action/Plan: Pt discharging home with self care. No f/u per PT/OT and no DME needs. Pt has insurance, PCP, and transport home.   Expected Discharge Date:  10/08/17               Expected Discharge Plan:  Home/Self Care  In-House Referral:     Discharge planning Services     Post Acute Care Choice:    Choice offered to:     DME Arranged:    DME Agency:     HH Arranged:    HH Agency:     Status of Service:  Completed, signed off  If discussed at [employer] of Stay Meetings, dates discussed:    Additional Comments:  Pollie Friar, RN 10/08/2017, 12:08 PM

## 2017-10-08 NOTE — Transfer of Care (Signed)
Immediate Anesthesia Transfer of Care Note  Patient: Tyrone Roman  Procedure(s) Performed: RIGHT LUMBAR ONE-TWO LAMINECTOMY WITH MICRODISCECTOMY (Right Back)  Patient Location: PACU  Anesthesia Type:General  Level of Consciousness: awake, alert , oriented and patient cooperative  Airway & Oxygen Therapy: Patient Spontanous Breathing and Patient connected to nasal cannula oxygen  Post-op Assessment: Report given to RN, Post -op Vital signs reviewed and stable and Patient moving all extremities  Post vital signs: Reviewed and stable  Last Vitals:  Vitals Value Taken Time  BP    Temp    Pulse    Resp    SpO2      Last Pain:  Vitals:   10/08/17 0412  TempSrc: Oral  PainSc:          Complications: No apparent anesthesia complications

## 2017-10-08 NOTE — Progress Notes (Signed)
NURSING PROGRESS NOTE  CHAPMAN MATTEUCCI [id number] Discharge Data: 10/08/2017 11:47 AM Attending Provider: Consuella Lose, MD PCP:Fry, Ishmael Holter, MD     [name] to be D/C'd Home per MD order.  Discussed with the patient the After Visit Summary and all questions fully answered. All IV's discontinued with no bleeding noted. All belongings returned to patient for patient to take home.   Last Vital Signs:  Blood pressure (!) 149/90, pulse 87, temperature 98.3 F (36.8 C), temperature source Oral, resp. rate 16, height 6\' 2"  (1.88 m), weight 101.7 kg, SpO2 93 %.  Discharge Medication List Allergies as of 10/08/2017      Reactions   Cephalexin Shortness Of Breath, Rash   Has patient had a PCN reaction causing immediate rash, facial/tongue/throat swelling, SOB or lightheadedness with hypotension: Yes Has patient had a PCN reaction causing severe rash involving mucus membranes or skin necrosis: No Has patient had a PCN reaction that required hospitalization: No Has patient had a PCN reaction occurring within the last 10 years: No If all of the above answers are "NO", then may proceed with Cephalosporin use.   Clarithromycin Shortness Of Breath, Rash   Certolizumab Pegol    Hives    Doxycycline Rash   Hydroxyzine Rash   Lidoderm Other (See Comments)   "made me act weird"   Pyrithione Zinc Rash      Medication List    TAKE these medications   aspirin EC 81 MG tablet Take 1 tablet (81 mg total) by mouth daily. Start taking on:  10/14/2017 What changed:  These instructions start on 10/14/2017. If you are unsure what to do until then, ask your doctor or other care provider.   cetirizine 10 MG tablet Commonly known as:  ZYRTEC Take 10 mg by mouth daily.   cyclobenzaprine 10 MG tablet Commonly known as:  FLEXERIL Take 1 tablet (10 mg total) by mouth 2 (two) times daily as needed for muscle spasms.   diclofenac sodium 1 % Gel Commonly known as:  VOLTAREN Apply 2 g topically 4  (four) times daily.   isosorbide mononitrate 30 MG 24 hr tablet Commonly known as:  IMDUR Take 1 tablet (30 mg total) by mouth daily.   leflunomide 20 MG tablet Commonly known as:  ARAVA Take 1 tablet (20 mg total) by mouth 2 (two) times daily. What changed:  when to take this   methocarbamol 500 MG tablet Commonly known as:  ROBAXIN Take 1 tablet (500 mg total) by mouth every 6 (six) hours as needed for muscle spasms.   metoprolol succinate 25 MG 24 hr tablet Commonly known as:  TOPROL-XL TAKE 1 TABLET BY MOUTH DAILY   multivitamin with minerals Tabs tablet Take 1 tablet by mouth daily.   naproxen 500 MG tablet Commonly known as:  NAPROSYN Take 500 mg by mouth 2 (two) times daily.   nitroGLYCERIN 0.4 MG SL tablet Commonly known as:  NITROSTAT Place 1 tablet (0.4 mg total) under the tongue every 5 (five) minutes as needed for chest pain (3 doses max).   omeprazole 40 MG capsule Commonly known as:  PRILOSEC Take 40 mg by mouth daily.   oxyCODONE-acetaminophen 7.5-325 MG tablet Commonly known as:  PERCOCET Take 1 tablet by mouth every 4 (four) hours as needed for severe pain.   predniSONE 5 MG tablet Commonly known as:  DELTASONE Take 5 mg by mouth daily as needed (arthritis).   SB DOCUSATE SODIUM/SENNA 8.6-50 MG tablet Generic drug:  senna-docusate  Take 1 tablet by mouth daily as needed for moderate constipation.   sildenafil 20 MG tablet Commonly known as:  REVATIO Take 20 mg by mouth daily as needed What changed:    how much to take  how to take this  when to take this  reasons to take this  additional instructions   simvastatin 40 MG tablet Commonly known as:  ZOCOR Take 1 tablet (40 mg total) by mouth daily.   tamsulosin 0.4 MG Caps capsule Commonly known as:  FLOMAX TAKE ONE CAPSULE BY MOUTH DAILY   zolpidem 10 MG tablet Commonly known as:  AMBIEN Take 1 tablet (10 mg total) by mouth at bedtime. What changed:    when to take  this  reasons to take this

## 2017-10-08 NOTE — Discharge Summary (Signed)
Physician Discharge Summary  Patient ID: Tyrone Roman MRN: [id number] DOB/AGE: 06/13/1945 72 y.o.  Admit date: 10/07/2017 Discharge date: 10/08/2017  Admission Diagnoses:  Lumbar HNP  Discharge Diagnoses:  Same Active Problems:   HNP (herniated nucleus pulposus), lumbar   Lumbar herniated disc   Discharged Condition: Stable  Hospital Course:  Tyrone Roman is a 72 y.o. male who presented to ER due to acute, severe right low back pain with sciatica. MRI ordered of L spine and significant for large right disc bulge that inferiorly migrated and contacted L2 nerve root. Underwent surgery as below. There were no post op complications. He had complete resolution in pre op pain. At time of discharge, pain was well controlled, ambulating with Pt/OT, tolerating po, voiding normal. Ready for discharge.   Treatments: Surgery - L1-2 right laminotomy for miscrodiscectomy  Discharge Exam: Blood pressure (!) 149/90, pulse 87, temperature 98.3 F (36.8 C), temperature source Oral, resp. rate 16, height 6\' 2"  (1.88 m), weight 101.7 kg, SpO2 93 %. Awake, alert, oriented Speech fluent, appropriate CN grossly intact 5/5 BUE/BLE Wound c/d/i  Disposition: Discharge disposition: 01-Home or Self Care       Discharge Instructions    Call MD for:  difficulty breathing, headache or visual disturbances   Complete by:  As directed    Call MD for:  persistant dizziness or light-headedness   Complete by:  As directed    Call MD for:  redness, tenderness, or signs of infection (pain, swelling, redness, odor or green/yellow discharge around incision site)   Complete by:  As directed    Call MD for:  severe uncontrolled pain   Complete by:  As directed    Call MD for:  temperature >100.4   Complete by:  As directed    Diet general   Complete by:  As directed    Driving Restrictions   Complete by:  As directed    Do not drive until given clearance.   Increase activity slowly   Complete by:  As  directed    Lifting restrictions   Complete by:  As directed    Do not lift anything >10lbs. Avoid bending and twisting in awkward positions. Avoid bending at the back.   May shower / Bathe   Complete by:  As directed    In 24 hours. Okay to wash wound with warm soapy water. Avoid scrubbing the wound. Pat dry.   Remove dressing in 24 hours   Complete by:  As directed      Allergies as of 10/08/2017      Reactions   Cephalexin Shortness Of Breath, Rash   Has patient had a PCN reaction causing immediate rash, facial/tongue/throat swelling, SOB or lightheadedness with hypotension: Yes Has patient had a PCN reaction causing severe rash involving mucus membranes or skin necrosis: No Has patient had a PCN reaction that required hospitalization: No Has patient had a PCN reaction occurring within the last 10 years: No If all of the above answers are "NO", then may proceed with Cephalosporin use.   Clarithromycin Shortness Of Breath, Rash   Certolizumab Pegol    Hives    Doxycycline Rash   Hydroxyzine Rash   Lidoderm Other (See Comments)   "made me act weird"   Pyrithione Zinc Rash      Medication List    TAKE these medications   aspirin EC 81 MG tablet Take 1 tablet (81 mg total) by mouth daily. Start taking on:  10/14/2017 What  changed:  These instructions start on 10/14/2017. If you are unsure what to do until then, ask your doctor or other care provider.   cetirizine 10 MG tablet Commonly known as:  ZYRTEC Take 10 mg by mouth daily.   cyclobenzaprine 10 MG tablet Commonly known as:  FLEXERIL Take 1 tablet (10 mg total) by mouth 2 (two) times daily as needed for muscle spasms.   diclofenac sodium 1 % Gel Commonly known as:  VOLTAREN Apply 2 g topically 4 (four) times daily.   isosorbide mononitrate 30 MG 24 hr tablet Commonly known as:  IMDUR Take 1 tablet (30 mg total) by mouth daily.   leflunomide 20 MG tablet Commonly known as:  ARAVA Take 1 tablet (20 mg total) by  mouth 2 (two) times daily. What changed:  when to take this   methocarbamol 500 MG tablet Commonly known as:  ROBAXIN Take 1 tablet (500 mg total) by mouth every 6 (six) hours as needed for muscle spasms.   metoprolol succinate 25 MG 24 hr tablet Commonly known as:  TOPROL-XL TAKE 1 TABLET BY MOUTH DAILY   multivitamin with minerals Tabs tablet Take 1 tablet by mouth daily.   naproxen 500 MG tablet Commonly known as:  NAPROSYN Take 500 mg by mouth 2 (two) times daily.   nitroGLYCERIN 0.4 MG SL tablet Commonly known as:  NITROSTAT Place 1 tablet (0.4 mg total) under the tongue every 5 (five) minutes as needed for chest pain (3 doses max).   omeprazole 40 MG capsule Commonly known as:  PRILOSEC Take 40 mg by mouth daily.   oxyCODONE-acetaminophen 7.5-325 MG tablet Commonly known as:  PERCOCET Take 1 tablet by mouth every 4 (four) hours as needed for severe pain.   predniSONE 5 MG tablet Commonly known as:  DELTASONE Take 5 mg by mouth daily as needed (arthritis).   SB DOCUSATE SODIUM/SENNA 8.6-50 MG tablet Generic drug:  senna-docusate Take 1 tablet by mouth daily as needed for moderate constipation.   sildenafil 20 MG tablet Commonly known as:  REVATIO Take 20 mg by mouth daily as needed What changed:    how much to take  how to take this  when to take this  reasons to take this  additional instructions   simvastatin 40 MG tablet Commonly known as:  ZOCOR Take 1 tablet (40 mg total) by mouth daily.   tamsulosin 0.4 MG Caps capsule Commonly known as:  FLOMAX TAKE ONE CAPSULE BY MOUTH DAILY   zolpidem 10 MG tablet Commonly known as:  AMBIEN Take 1 tablet (10 mg total) by mouth at bedtime. What changed:    when to take this  reasons to take this      Follow-up Information    Consuella Lose, MD. Schedule an appointment as soon as possible for a visit in 3 week(s).   Specialty:  Neurosurgery Contact information: [address] [place]  [address] [place] [address] [phone]           Signed: Traci Sermon 10/08/2017, 10:28 AM

## 2017-10-08 NOTE — Evaluation (Signed)
Physical Therapy Evaluation Patient Details Name: Tyrone Roman MRN: [id number] DOB: [birth year] Today's Date: 10/08/2017   History of Present Illness  Patient is a 72 y.o male who presents s/p: Right L1-2 laminotomy and microdiscectomy  Clinical Impression  Patient seen for mobility assessment s/p spinal surgery. Mobilizing well. Educated patient on precautions, mobility expectations, safety and car transfers. Patient able to perform stair negotiation without difficulty. No further acute PT needs. Will sign off.     Follow Up Recommendations No PT follow up    Equipment Recommendations  None recommended by PT    Recommendations for Other Services       Precautions / Restrictions Precautions Precautions: Back Precaution Comments: Verbally reviewed patient able to recall 3/3 Restrictions Weight Bearing Restrictions: No      Mobility  Bed Mobility Overal bed mobility: Needs Assistance Bed Mobility: Rolling;Sidelying to Sit Rolling: Supervision Sidelying to sit: Modified independent (Device/Increase time)       General bed mobility comments: VC for log roll  Transfers Overall transfer level: Independent                  Ambulation/Gait Ambulation/Gait assistance: Independent Gait Distance (Feet): 240 Feet Assistive device: None Gait Pattern/deviations: WFL(Within Functional Limits)   Gait velocity interpretation: >4.37 ft/sec, indicative of normal walking speed General Gait Details: (Steady with ambulation)  Stairs Stairs: Yes Stairs assistance: Modified independent (Device/Increase time) Stair Management: One rail Right Number of Stairs: 10    Wheelchair Mobility    Modified Rankin (Stroke Patients Only)       Balance Overall balance assessment: Independent                                           Pertinent Vitals/Pain Pain Assessment: No/denies pain    Home Living Family/patient expects to be discharged to:: Private  residence Living Arrangements: Alone   Type of Home: House Home Access: Stairs to enter Entrance Stairs-Rails: Right Entrance Stairs-Number of Steps: 2 and 7 Home Layout: Two level;Able to live on main level with bedroom/bathroom Home Equipment: Cane - single point      Prior Function Level of Independence: Independent         Comments: Has a cane but does not use it     Hand Dominance   Dominant Hand: Right    Extremity/Trunk Assessment   Upper Extremity Assessment Upper Extremity Assessment: Defer to OT evaluation    Lower Extremity Assessment Lower Extremity Assessment: Overall WFL for tasks assessed    Cervical / Trunk Assessment Cervical / Trunk Assessment: (s/p spinal surgery)  Communication   Communication: No difficulties  Cognition Arousal/Alertness: Awake/alert Behavior During Therapy: WFL for tasks assessed/performed Overall Cognitive Status: Within Functional Limits for tasks assessed                                        General Comments      Exercises     Assessment/Plan    PT Assessment Patent does not need any further PT services  PT Problem List         PT Treatment Interventions      PT Goals (Current goals can be found in the Care Plan section)  Acute Rehab PT Goals PT Goal Formulation: All assessment and education complete, DC therapy  Frequency     Barriers to discharge        Co-evaluation               AM-PAC PT "6 Clicks" Daily Activity  Outcome Measure Difficulty turning over in bed (including adjusting bedclothes, sheets and blankets)?: None Difficulty moving from lying on back to sitting on the side of the bed? : None Difficulty sitting down on and standing up from a chair with arms (e.g., wheelchair, bedside commode, etc,.)?: None Help needed moving to and from a bed to chair (including a wheelchair)?: None Help needed walking in hospital room?: None Help needed climbing 3-5 steps with a  railing? : A Little 6 Click Score: 23    End of Session Equipment Utilized During Treatment: Gait belt Activity Tolerance: Patient tolerated treatment well Patient left: in chair;with call bell/phone within reach Nurse Communication: Mobility status PT Visit Diagnosis: Difficulty in walking, not elsewhere classified (R26.2);Pain Pain - part of body: Leg    Time: 0479-9872 PT Time Calculation (min) (ACUTE ONLY): 17 min   Charges:   PT Evaluation $PT Eval Low Complexity: [place], PT DPT  Board Certified Neurologic Specialist [phone]   Duncan Dull 10/08/2017, 9:39 AM

## 2017-10-22 DIAGNOSIS — M0579 Rheumatoid arthritis with rheumatoid factor of multiple sites without organ or systems involvement: Secondary | ICD-10-CM | POA: Diagnosis not present

## 2017-11-03 ENCOUNTER — Other Ambulatory Visit: Payer: Self-pay | Admitting: Family Medicine

## 2017-11-05 ENCOUNTER — Other Ambulatory Visit: Payer: Self-pay | Admitting: Family Medicine

## 2017-11-07 NOTE — Telephone Encounter (Signed)
Dr. Fry please advise on refill. 

## 2017-11-07 NOTE — Telephone Encounter (Signed)
Call in #30 with 5 rf 

## 2017-12-03 ENCOUNTER — Other Ambulatory Visit: Payer: Self-pay | Admitting: Family Medicine

## 2017-12-04 DIAGNOSIS — M5126 Other intervertebral disc displacement, lumbar region: Secondary | ICD-10-CM | POA: Diagnosis not present

## 2017-12-05 ENCOUNTER — Encounter (HOSPITAL_COMMUNITY): Payer: Self-pay | Admitting: *Deleted

## 2017-12-05 ENCOUNTER — Other Ambulatory Visit: Payer: Self-pay | Admitting: Neurosurgery

## 2017-12-05 ENCOUNTER — Other Ambulatory Visit: Payer: Self-pay

## 2017-12-05 DIAGNOSIS — M5126 Other intervertebral disc displacement, lumbar region: Secondary | ICD-10-CM | POA: Diagnosis not present

## 2017-12-05 DIAGNOSIS — M5127 Other intervertebral disc displacement, lumbosacral region: Secondary | ICD-10-CM | POA: Diagnosis not present

## 2017-12-05 DIAGNOSIS — M4807 Spinal stenosis, lumbosacral region: Secondary | ICD-10-CM | POA: Diagnosis not present

## 2017-12-05 MED ORDER — VANCOMYCIN HCL 10 G IV SOLR
1500.0000 mg | INTRAVENOUS | Status: AC
  Administered 2017-12-06: 1500 mg via INTRAVENOUS
  Filled 2017-12-05: qty 1500

## 2017-12-05 NOTE — Progress Notes (Signed)
Pt denies SOB, chest pain, and being under the care of a cardiologist. Pt denies having an echo. Requested labs from [place] stated that last dose of Aspirin was Wednesday as instructed by MD. Pt made aware to stop taking vitamins, fish oil, and herbal medications. Do not take any NSAIDs ie: Ibuprofen, Advil, Naproxen (Aleve/ Naprosyn). Voltaren, Motrin, BC and Goody Powder. Pt verbalized understanding of all pre-op instructions.

## 2017-12-06 ENCOUNTER — Inpatient Hospital Stay (HOSPITAL_COMMUNITY)
Admission: RE | Admit: 2017-12-06 | Discharge: 2017-12-07 | DRG: 520 | Disposition: A | Discharge status: Home / Self Care | Payer: Medicare Other | Source: Ambulatory Visit | Attending: Neurosurgery | Admitting: Neurosurgery

## 2017-12-06 ENCOUNTER — Ambulatory Visit (HOSPITAL_COMMUNITY): Payer: Medicare Other | Admitting: Anesthesiology

## 2017-12-06 ENCOUNTER — Encounter (HOSPITAL_COMMUNITY): Payer: Self-pay

## 2017-12-06 ENCOUNTER — Encounter (HOSPITAL_COMMUNITY)
Admission: RE | Disposition: A | Discharge status: Home / Self Care | Payer: Self-pay | Source: Ambulatory Visit | Attending: Neurosurgery

## 2017-12-06 ENCOUNTER — Ambulatory Visit (HOSPITAL_COMMUNITY): Payer: Medicare Other

## 2017-12-06 DIAGNOSIS — K219 Gastro-esophageal reflux disease without esophagitis: Secondary | ICD-10-CM | POA: Diagnosis present

## 2017-12-06 DIAGNOSIS — N401 Enlarged prostate with lower urinary tract symptoms: Secondary | ICD-10-CM | POA: Diagnosis not present

## 2017-12-06 DIAGNOSIS — Z79899 Other long term (current) drug therapy: Secondary | ICD-10-CM

## 2017-12-06 DIAGNOSIS — I2510 Atherosclerotic heart disease of native coronary artery without angina pectoris: Secondary | ICD-10-CM | POA: Diagnosis present

## 2017-12-06 DIAGNOSIS — Z419 Encounter for procedure for purposes other than remedying health state, unspecified: Secondary | ICD-10-CM

## 2017-12-06 DIAGNOSIS — M069 Rheumatoid arthritis, unspecified: Secondary | ICD-10-CM | POA: Diagnosis present

## 2017-12-06 DIAGNOSIS — I252 Old myocardial infarction: Secondary | ICD-10-CM

## 2017-12-06 DIAGNOSIS — Z7982 Long term (current) use of aspirin: Secondary | ICD-10-CM | POA: Diagnosis not present

## 2017-12-06 DIAGNOSIS — R32 Unspecified urinary incontinence: Secondary | ICD-10-CM | POA: Diagnosis not present

## 2017-12-06 DIAGNOSIS — Z888 Allergy status to other drugs, medicaments and biological substances status: Secondary | ICD-10-CM

## 2017-12-06 DIAGNOSIS — M5126 Other intervertebral disc displacement, lumbar region: Secondary | ICD-10-CM | POA: Diagnosis present

## 2017-12-06 DIAGNOSIS — Z881 Allergy status to other antibiotic agents status: Secondary | ICD-10-CM

## 2017-12-06 DIAGNOSIS — I25118 Atherosclerotic heart disease of native coronary artery with other forms of angina pectoris: Secondary | ICD-10-CM | POA: Diagnosis not present

## 2017-12-06 DIAGNOSIS — Z791 Long term (current) use of non-steroidal anti-inflammatories (NSAID): Secondary | ICD-10-CM

## 2017-12-06 DIAGNOSIS — J449 Chronic obstructive pulmonary disease, unspecified: Secondary | ICD-10-CM | POA: Diagnosis present

## 2017-12-06 DIAGNOSIS — E785 Hyperlipidemia, unspecified: Secondary | ICD-10-CM | POA: Diagnosis not present

## 2017-12-06 DIAGNOSIS — M5116 Intervertebral disc disorders with radiculopathy, lumbar region: Secondary | ICD-10-CM | POA: Diagnosis not present

## 2017-12-06 DIAGNOSIS — Z981 Arthrodesis status: Secondary | ICD-10-CM | POA: Diagnosis not present

## 2017-12-06 DIAGNOSIS — Z87891 Personal history of nicotine dependence: Secondary | ICD-10-CM

## 2017-12-06 DIAGNOSIS — I10 Essential (primary) hypertension: Secondary | ICD-10-CM | POA: Diagnosis present

## 2017-12-06 HISTORY — DX: Other intervertebral disc displacement, lumbar region: M51.26

## 2017-12-06 HISTORY — PX: LUMBAR LAMINECTOMY/DECOMPRESSION MICRODISCECTOMY: SHX5026

## 2017-12-06 LAB — BASIC METABOLIC PANEL
Anion gap: 3 — ABNORMAL LOW (ref 5–15)
BUN: 21 mg/dL (ref 8–23)
CO2: 25 mmol/L (ref 22–32)
Calcium: 9.7 mg/dL (ref 8.9–10.3)
Chloride: 112 mmol/L — ABNORMAL HIGH (ref 98–111)
Creatinine, Ser: 1.05 mg/dL (ref 0.61–1.24)
GFR calc Af Amer: >60 mL/min (ref >60)
GFR calc non Af Amer: >60 mL/min (ref >60)
Glucose, Bld: 112 mg/dL — ABNORMAL HIGH (ref 70–99)
Potassium: 3.8 mmol/L (ref 3.5–5.1)
Sodium: 140 mmol/L (ref 135–145)

## 2017-12-06 LAB — CBC
HCT: 42.4 % (ref 39.0–52.0)
Hemoglobin: 13.8 g/dL (ref 13.0–17.0)
MCH: 29.4 pg (ref 26.0–34.0)
MCHC: 32.5 g/dL (ref 30.0–36.0)
MCV: 90.2 fL (ref 80.0–100.0)
Platelets: 138 10*3/uL — ABNORMAL LOW (ref 150–400)
RBC: 4.7 MIL/uL (ref 4.22–5.81)
RDW: 13.6 % (ref 11.5–15.5)
WBC: 4.6 10*3/uL (ref 4.0–10.5)
nRBC: 0 % (ref 0.0–0.2)

## 2017-12-06 SURGERY — LUMBAR LAMINECTOMY/DECOMPRESSION MICRODISCECTOMY 1 LEVEL
Anesthesia: General

## 2017-12-06 MED ORDER — SODIUM CHLORIDE 0.9 % IV SOLN
INTRAVENOUS | Status: DC | PRN
  Administered 2017-12-06: 10 ug/min via INTRAVENOUS

## 2017-12-06 MED ORDER — HEMOSTATIC AGENTS (NO CHARGE) OPTIME
TOPICAL | Status: DC | PRN
  Administered 2017-12-06 (×2): 1 via TOPICAL

## 2017-12-06 MED ORDER — ZOLPIDEM TARTRATE 5 MG PO TABS: 5.0000 mg | ORAL_TABLET | Freq: Every evening | ORAL | Status: DC | PRN

## 2017-12-06 MED ORDER — ONDANSETRON HCL 4 MG/2ML IJ SOLN: 4.0000 mg | Freq: Four times a day (QID) | INTRAMUSCULAR | Status: DC | PRN

## 2017-12-06 MED ORDER — METOPROLOL SUCCINATE ER 25 MG PO TB24
ORAL_TABLET | ORAL | Status: AC
  Filled 2017-12-06: qty 1

## 2017-12-06 MED ORDER — OXYCODONE HCL 5 MG PO TABS: 5.0000 mg | ORAL_TABLET | ORAL | Status: DC | PRN

## 2017-12-06 MED ORDER — METHOCARBAMOL 500 MG PO TABS: 500.0000 mg | ORAL_TABLET | Freq: Four times a day (QID) | ORAL | Status: DC | PRN

## 2017-12-06 MED ORDER — DEXAMETHASONE SODIUM PHOSPHATE 10 MG/ML IJ SOLN
INTRAMUSCULAR | Status: AC
  Filled 2017-12-06: qty 1

## 2017-12-06 MED ORDER — FLEET ENEMA 7-19 GM/118ML RE ENEM: 1.0000 | ENEMA | Freq: Once | RECTAL | Status: DC | PRN

## 2017-12-06 MED ORDER — GABAPENTIN 300 MG PO CAPS
300.0000 mg | ORAL_CAPSULE | Freq: Three times a day (TID) | ORAL | Status: DC
  Administered 2017-12-06: 300 mg via ORAL
  Filled 2017-12-06: qty 1

## 2017-12-06 MED ORDER — ASPIRIN EC 81 MG PO TBEC: 81.0000 mg | DELAYED_RELEASE_TABLET | Freq: Every day | ORAL | Status: DC

## 2017-12-06 MED ORDER — MENTHOL 3 MG MT LOZG: 1.0000 | LOZENGE | OROMUCOSAL | Status: DC | PRN

## 2017-12-06 MED ORDER — FENTANYL CITRATE (PF) 250 MCG/5ML IJ SOLN
INTRAMUSCULAR | Status: AC
  Filled 2017-12-06: qty 5

## 2017-12-06 MED ORDER — ROCURONIUM BROMIDE 50 MG/5ML IV SOSY
PREFILLED_SYRINGE | INTRAVENOUS | Status: AC
  Filled 2017-12-06: qty 10

## 2017-12-06 MED ORDER — KETOROLAC TROMETHAMINE 30 MG/ML IJ SOLN
INTRAMUSCULAR | Status: AC
  Filled 2017-12-06: qty 1

## 2017-12-06 MED ORDER — FENTANYL CITRATE (PF) 250 MCG/5ML IJ SOLN
INTRAMUSCULAR | Status: DC | PRN
  Administered 2017-12-06 (×5): 50 ug via INTRAVENOUS

## 2017-12-06 MED ORDER — KETOROLAC TROMETHAMINE 30 MG/ML IJ SOLN
30.0000 mg | Freq: Once | INTRAMUSCULAR | Status: AC | PRN
  Administered 2017-12-06: 30 mg via INTRAVENOUS

## 2017-12-06 MED ORDER — SODIUM CHLORIDE 0.9 % IV SOLN
INTRAVENOUS | Status: DC | PRN
  Administered 2017-12-06: 14:00:00

## 2017-12-06 MED ORDER — THROMBIN 5000 UNITS EX SOLR
OROMUCOSAL | Status: DC | PRN
  Administered 2017-12-06: 14:00:00 via TOPICAL

## 2017-12-06 MED ORDER — PROMETHAZINE HCL 25 MG/ML IJ SOLN: 6.2500 mg | INTRAMUSCULAR | Status: DC | PRN

## 2017-12-06 MED ORDER — ONDANSETRON HCL 4 MG/2ML IJ SOLN
INTRAMUSCULAR | Status: AC
  Filled 2017-12-06: qty 4

## 2017-12-06 MED ORDER — SODIUM CHLORIDE 0.9% FLUSH: 3.0000 mL | Freq: Two times a day (BID) | INTRAVENOUS | Status: DC

## 2017-12-06 MED ORDER — LIDOCAINE 2% (20 MG/ML) 5 ML SYRINGE
INTRAMUSCULAR | Status: DC | PRN
  Administered 2017-12-06: 70 mg via INTRAVENOUS

## 2017-12-06 MED ORDER — ACETAMINOPHEN 500 MG PO TABS
1000.0000 mg | ORAL_TABLET | Freq: Four times a day (QID) | ORAL | Status: DC
  Administered 2017-12-06: 1000 mg via ORAL
  Filled 2017-12-06: qty 2

## 2017-12-06 MED ORDER — LIDOCAINE 2% (20 MG/ML) 5 ML SYRINGE
INTRAMUSCULAR | Status: AC
  Filled 2017-12-06: qty 5

## 2017-12-06 MED ORDER — DOCUSATE SODIUM 100 MG PO CAPS: 100.0000 mg | ORAL_CAPSULE | Freq: Two times a day (BID) | ORAL | Status: DC

## 2017-12-06 MED ORDER — MIDAZOLAM HCL 5 MG/5ML IJ SOLN
INTRAMUSCULAR | Status: DC | PRN
  Administered 2017-12-06: 1 mg via INTRAVENOUS

## 2017-12-06 MED ORDER — THROMBIN 5000 UNITS EX SOLR
CUTANEOUS | Status: DC | PRN
  Administered 2017-12-06 (×2): 5000 [IU] via TOPICAL

## 2017-12-06 MED ORDER — BUPIVACAINE HCL (PF) 0.5 % IJ SOLN
INTRAMUSCULAR | Status: AC
  Filled 2017-12-06: qty 30

## 2017-12-06 MED ORDER — ONDANSETRON HCL 4 MG/2ML IJ SOLN
INTRAMUSCULAR | Status: DC | PRN
  Administered 2017-12-06: 4 mg via INTRAVENOUS

## 2017-12-06 MED ORDER — HYDROMORPHONE HCL 1 MG/ML IJ SOLN: 0.5000 mg | INTRAMUSCULAR | Status: DC | PRN

## 2017-12-06 MED ORDER — DEXAMETHASONE SODIUM PHOSPHATE 10 MG/ML IJ SOLN
INTRAMUSCULAR | Status: DC | PRN
  Administered 2017-12-06: 10 mg via INTRAVENOUS

## 2017-12-06 MED ORDER — LIDOCAINE-EPINEPHRINE 1 %-1:100000 IJ SOLN
INTRAMUSCULAR | Status: AC
  Filled 2017-12-06: qty 1

## 2017-12-06 MED ORDER — ONDANSETRON HCL 4 MG PO TABS: 4.0000 mg | ORAL_TABLET | Freq: Four times a day (QID) | ORAL | Status: DC | PRN

## 2017-12-06 MED ORDER — SODIUM CHLORIDE 0.9 % IV SOLN: 250.0000 mL | INTRAVENOUS | Status: DC

## 2017-12-06 MED ORDER — 0.9 % SODIUM CHLORIDE (POUR BTL) OPTIME
TOPICAL | Status: DC | PRN
  Administered 2017-12-06: 1000 mL

## 2017-12-06 MED ORDER — BISACODYL 10 MG RE SUPP: 10.0000 mg | Freq: Every day | RECTAL | Status: DC | PRN

## 2017-12-06 MED ORDER — HYDROCODONE-ACETAMINOPHEN 5-325 MG PO TABS: 1.0000 | ORAL_TABLET | ORAL | Status: DC | PRN

## 2017-12-06 MED ORDER — THROMBIN 5000 UNITS EX SOLR
CUTANEOUS | Status: AC
  Filled 2017-12-06: qty 15000

## 2017-12-06 MED ORDER — ACETAMINOPHEN 325 MG PO TABS: 650.0000 mg | ORAL_TABLET | ORAL | Status: DC | PRN

## 2017-12-06 MED ORDER — VANCOMYCIN HCL IN DEXTROSE 1-5 GM/200ML-% IV SOLN
1000.0000 mg | Freq: Once | INTRAVENOUS | Status: DC
  Filled 2017-12-06: qty 1200

## 2017-12-06 MED ORDER — HYDROMORPHONE HCL 1 MG/ML IJ SOLN
0.2500 mg | INTRAMUSCULAR | Status: DC | PRN
  Administered 2017-12-06 (×2): 0.5 mg via INTRAVENOUS

## 2017-12-06 MED ORDER — SUGAMMADEX SODIUM 200 MG/2ML IV SOLN
INTRAVENOUS | Status: DC | PRN
  Administered 2017-12-06 (×2): 50 mg via INTRAVENOUS

## 2017-12-06 MED ORDER — SENNA 8.6 MG PO TABS: 1.0000 | ORAL_TABLET | Freq: Two times a day (BID) | ORAL | Status: DC

## 2017-12-06 MED ORDER — METHOCARBAMOL 1000 MG/10ML IJ SOLN
500.0000 mg | Freq: Four times a day (QID) | INTRAVENOUS | Status: DC | PRN
  Filled 2017-12-06: qty 5

## 2017-12-06 MED ORDER — CHLORHEXIDINE GLUCONATE CLOTH 2 % EX PADS: 6.0000 | MEDICATED_PAD | Freq: Once | CUTANEOUS | Status: DC

## 2017-12-06 MED ORDER — SODIUM CHLORIDE 0.9% FLUSH: 3.0000 mL | INTRAVENOUS | Status: DC | PRN

## 2017-12-06 MED ORDER — PROPOFOL 10 MG/ML IV BOLUS
INTRAVENOUS | Status: AC
  Filled 2017-12-06: qty 20

## 2017-12-06 MED ORDER — METHYLPREDNISOLONE ACETATE 80 MG/ML IJ SUSP
INTRAMUSCULAR | Status: AC
  Filled 2017-12-06: qty 1

## 2017-12-06 MED ORDER — METOPROLOL SUCCINATE ER 25 MG PO TB24
25.0000 mg | ORAL_TABLET | Freq: Every day | ORAL | Status: DC
  Administered 2017-12-06: 25 mg via ORAL

## 2017-12-06 MED ORDER — MIDAZOLAM HCL 2 MG/2ML IJ SOLN
INTRAMUSCULAR | Status: AC
  Filled 2017-12-06: qty 2

## 2017-12-06 MED ORDER — LACTATED RINGERS IV SOLN
INTRAVENOUS | Status: DC
  Administered 2017-12-06: 12:00:00 via INTRAVENOUS

## 2017-12-06 MED ORDER — ACETAMINOPHEN 650 MG RE SUPP: 650.0000 mg | RECTAL | Status: DC | PRN

## 2017-12-06 MED ORDER — SODIUM CHLORIDE 0.9 % IV SOLN: INTRAVENOUS | Status: DC

## 2017-12-06 MED ORDER — HYDROMORPHONE HCL 1 MG/ML IJ SOLN
INTRAMUSCULAR | Status: AC
  Administered 2017-12-06: 0.5 mg via INTRAVENOUS
  Filled 2017-12-06: qty 1

## 2017-12-06 MED ORDER — PROPOFOL 10 MG/ML IV BOLUS
INTRAVENOUS | Status: DC | PRN
  Administered 2017-12-06: 140 mg via INTRAVENOUS

## 2017-12-06 MED ORDER — PHENOL 1.4 % MT LIQD: 1.0000 | OROMUCOSAL | Status: DC | PRN

## 2017-12-06 MED ORDER — ROCURONIUM BROMIDE 10 MG/ML (PF) SYRINGE
PREFILLED_SYRINGE | INTRAVENOUS | Status: DC | PRN
  Administered 2017-12-06: 50 mg via INTRAVENOUS

## 2017-12-06 MED ORDER — SENNOSIDES-DOCUSATE SODIUM 8.6-50 MG PO TABS: 1.0000 | ORAL_TABLET | Freq: Every evening | ORAL | Status: DC | PRN

## 2017-12-06 SURGICAL SUPPLY — 62 items
ADH SKN CLS APL DERMABOND .7 (GAUZE/BANDAGES/DRESSINGS) ×1
APL SKNCLS STERI-STRIP NONHPOA (GAUZE/BANDAGES/DRESSINGS)
BAG DECANTER FOR FLEXI CONT (MISCELLANEOUS) ×2 IMPLANT
BENZOIN TINCTURE PRP APPL 2/3 (GAUZE/BANDAGES/DRESSINGS) IMPLANT
BLADE CLIPPER SURG (BLADE) IMPLANT
BLADE SURG 11 STRL SS (BLADE) ×2 IMPLANT
BUR MATCHSTICK NEURO 3.0 LAGG (BURR) ×1 IMPLANT
BUR PRECISION FLUTE 5.0 (BURR) IMPLANT
CANISTER SUCT 3000ML PPV (MISCELLANEOUS) ×2 IMPLANT
CARTRIDGE OIL MAESTRO DRILL (MISCELLANEOUS) ×1 IMPLANT
COVER WAND RF STERILE (DRAPES) ×2 IMPLANT
DECANTER SPIKE VIAL GLASS SM (MISCELLANEOUS) ×2 IMPLANT
DERMABOND ADVANCED (GAUZE/BANDAGES/DRESSINGS) ×1
DERMABOND ADVANCED .7 DNX12 (GAUZE/BANDAGES/DRESSINGS) ×1 IMPLANT
DIFFUSER DRILL AIR PNEUMATIC (MISCELLANEOUS) ×2 IMPLANT
DRAPE LAPAROTOMY 100X72X124 (DRAPES) ×2 IMPLANT
DRAPE MICROSCOPE LEICA (MISCELLANEOUS) ×2 IMPLANT
DRAPE SURG 17X23 STRL (DRAPES) ×2 IMPLANT
DRSG OPSITE POSTOP 3X4 (GAUZE/BANDAGES/DRESSINGS) ×1 IMPLANT
DURAPREP 26ML APPLICATOR (WOUND CARE) ×2 IMPLANT
ELECT REM PT RETURN 9FT ADLT (ELECTROSURGICAL) ×2
ELECTRODE REM PT RTRN 9FT ADLT (ELECTROSURGICAL) ×1 IMPLANT
GAUZE 4X4 16PLY RFD (DISPOSABLE) IMPLANT
GAUZE SPONGE 4X4 12PLY STRL (GAUZE/BANDAGES/DRESSINGS) IMPLANT
GLOVE BIO SURGEON STRL SZ7 (GLOVE) IMPLANT
GLOVE BIOGEL PI IND STRL 7.0 (GLOVE) IMPLANT
GLOVE BIOGEL PI IND STRL 7.5 (GLOVE) ×1 IMPLANT
GLOVE BIOGEL PI INDICATOR 7.0 (GLOVE)
GLOVE BIOGEL PI INDICATOR 7.5 (GLOVE) ×1
GLOVE ECLIPSE 7.0 STRL STRAW (GLOVE) ×2 IMPLANT
GLOVE EXAM NITRILE XL STR (GLOVE) IMPLANT
GOWN STRL REUS W/ TWL LRG LVL3 (GOWN DISPOSABLE) ×2 IMPLANT
GOWN STRL REUS W/ TWL XL LVL3 (GOWN DISPOSABLE) IMPLANT
GOWN STRL REUS W/TWL 2XL LVL3 (GOWN DISPOSABLE) ×1 IMPLANT
GOWN STRL REUS W/TWL LRG LVL3 (GOWN DISPOSABLE) ×4
GOWN STRL REUS W/TWL XL LVL3 (GOWN DISPOSABLE)
GRAFT DURAGEN MATRIX 1WX1L (Tissue) ×1 IMPLANT
HEMOSTAT POWDER KIT SURGIFOAM (HEMOSTASIS) ×2 IMPLANT
KIT BASIN OR (CUSTOM PROCEDURE TRAY) ×2 IMPLANT
KIT TURNOVER KIT B (KITS) ×2 IMPLANT
NDL HYPO 18GX1.5 BLUNT FILL (NEEDLE) IMPLANT
NDL SPNL 18GX3.5 QUINCKE PK (NEEDLE) IMPLANT
NEEDLE HYPO 18GX1.5 BLUNT FILL (NEEDLE) IMPLANT
NEEDLE HYPO 22GX1.5 SAFETY (NEEDLE) ×2 IMPLANT
NEEDLE SPNL 18GX3.5 QUINCKE PK (NEEDLE) ×4 IMPLANT
NS IRRIG 1000ML POUR BTL (IV SOLUTION) ×2 IMPLANT
OIL CARTRIDGE MAESTRO DRILL (MISCELLANEOUS) ×2
PACK LAMINECTOMY NEURO (CUSTOM PROCEDURE TRAY) ×2 IMPLANT
PAD ARMBOARD 7.5X6 YLW CONV (MISCELLANEOUS) ×6 IMPLANT
RUBBERBAND STERILE (MISCELLANEOUS) ×4 IMPLANT
SEALANT ADHERUS EXTEND TIP (MISCELLANEOUS) ×1 IMPLANT
SPONGE LAP 4X18 RFD (DISPOSABLE) IMPLANT
SPONGE SURGIFOAM ABS GEL SZ50 (HEMOSTASIS) ×2 IMPLANT
STRIP CLOSURE SKIN 1/2X4 (GAUZE/BANDAGES/DRESSINGS) IMPLANT
SUT VIC AB 0 CT1 18XCR BRD8 (SUTURE) ×1 IMPLANT
SUT VIC AB 0 CT1 8-18 (SUTURE) ×4
SUT VIC AB 2-0 CT1 18 (SUTURE) IMPLANT
SUT VICRYL 3-0 RB1 18 ABS (SUTURE) ×4 IMPLANT
SYR 3ML LL SCALE MARK (SYRINGE) IMPLANT
TOWEL GREEN STERILE (TOWEL DISPOSABLE) ×2 IMPLANT
TOWEL GREEN STERILE FF (TOWEL DISPOSABLE) ×2 IMPLANT
WATER STERILE IRR 1000ML POUR (IV SOLUTION) ×2 IMPLANT

## 2017-12-06 NOTE — Anesthesia Postprocedure Evaluation (Signed)
Anesthesia Post Note  Patient: JAPHET MORGENTHALER  Procedure(s) Performed: RECURRENT MICRODISCECTOMY LUMBAR ONE - LUMBAR TWO (N/A )     Patient location during evaluation: PACU Anesthesia Type: General Level of consciousness: awake and alert Pain management: pain level controlled Vital Signs Assessment: post-procedure vital signs reviewed and stable Respiratory status: spontaneous breathing, nonlabored ventilation, respiratory function stable and patient connected to nasal cannula oxygen Cardiovascular status: blood pressure returned to baseline and stable Postop Assessment: no apparent nausea or vomiting Anesthetic complications: no    Last Vitals:  Vitals:   12/06/17 1645 12/06/17 1700  BP: (!) 141/91 (!) 150/96  Pulse: 65 65  Resp: 15 18  Temp:    SpO2: 96% 98%    Last Pain:  Vitals:   12/06/17 1620  TempSrc:   PainSc: 5                  Zeppelin Beckstrand S

## 2017-12-06 NOTE — Transfer of Care (Signed)
Immediate Anesthesia Transfer of Care Note  Patient: Tyrone Roman  Procedure(s) Performed: RECURRENT MICRODISCECTOMY LUMBAR ONE - LUMBAR TWO (N/A )  Patient Location: PACU  Anesthesia Type:General  Level of Consciousness: awake, alert  and oriented  Airway & Oxygen Therapy: Patient Spontanous Breathing and Patient connected to nasal cannula oxygen  Post-op Assessment: Report given to RN and Post -op Vital signs reviewed and stable  Post vital signs: Reviewed and stable  Last Vitals:  Vitals Value Taken Time  BP    Temp 36.4 C 12/06/2017  3:58 PM  Pulse 72 12/06/2017  3:58 PM  Resp 7 12/06/2017  3:58 PM  SpO2 97 % 12/06/2017  3:58 PM  Vitals shown include unvalidated device data.  Last Pain:  Vitals:   12/06/17 1222  TempSrc:   PainSc: 8          Complications: No apparent anesthesia complications

## 2017-12-06 NOTE — H&P (Signed)
Chief Complaint   Back pain  HPI   HPI: Tyrone Roman is a 72 y.o. male with recent history of right L1-2 laminectomy and microdiscectomy who presents today with recurrent right L1-2 HNP. He was doing well postoperatively up until several weeks ago when he had recurrent low back pain with right lower extremity pain. He has also noticed some difficulties with urinary incontinence at times. He underwent a repeat L spine MRI which revealed the recurrent disc. He presents today for surgery. He is without any concerns.   Patient Active Problem List   Diagnosis Date Noted  . HNP (herniated nucleus pulposus), lumbar 10/07/2017  . Lumbar herniated disc 10/07/2017  . Psoriasis of scalp 03/27/2016  . Rheumatoid arthritis ([place]) 03/25/2013  . GI bleeding 02/20/2012  . Dyspnea on exertion 09/08/2010  . COPD (chronic obstructive pulmonary disease) ([place]) 08/04/2010  . Bruising 08/04/2010  . SYNCOPE 02/01/2010  . HEADACHE 01/04/2010  . TINNITUS 12/16/2009  . Dizziness and giddiness 12/16/2009  . NECK SPRAIN AND STRAIN 10/21/2009  . LUMBAR SPRAIN AND STRAIN 10/21/2009  . Hyperlipidemia 06/10/2009  . Coronary artery disease of native artery of native heart with stable angina pectoris ([place]) 06/10/2009  . GERD 06/10/2009  . BARRETTS ESOPHAGUS 06/10/2009  . SHORTNESS OF BREATH 05/31/2009  . CONCUSSION WITH LOC OF 30 MINUTES OR LESS 02/28/2009  . LACERATION, SCALP 02/28/2009  . NEPHROLITHIASIS 07/01/2007  . CONTACT DERMATITIS 12/12/2006  . BPH with urinary obstruction 11/04/2006  . COLONIC POLYPS 10/21/2003  . DIVERTICULOSIS, COLON 10/21/2003    PMH: Past Medical History:  Diagnosis Date  . Barrett's esophageal ulceration   . BPH (benign prostatic hyperplasia)   . CAD (coronary artery disease)    sees Dr. Mar Daring  . Cataract    both eyes  . Colonic polyp   . Concussion with loss of consciousness of 30 minutes or less   . Contact dermatitis   . Contact with or exposure to venereal  diseases   . COPD (chronic obstructive pulmonary disease) ([place])    sees Dr. Kara Mead   . Diverticulosis of colon   . Dysphagia   . GERD (gastroesophageal reflux disease)   . HNP (herniated nucleus pulposus), lumbar    recurrent  . Hyperlipidemia   . Hypertension   . Laceration of scalp   . Myocardial infarction [facility])    2011- stent placed  . Nephrolithiasis   . Pre-operative cardiovascular examination   . Rheumatoid arthritis [facility])    sees Dr. Gavin Pound   . SOB (shortness of breath)     PSH: Past Surgical History:  Procedure Laterality Date  . CARDIAC CATHETERIZATION N/A 01/19/2016   Procedure: Left Heart Cath and Coronary Angiography;  Surgeon: Burnell Blanks, MD;  Location: [place] [facility];  Service: Cardiovascular;  Laterality: N/A;  . COLONOSCOPY  12/09/2014   per Dr. Silverio Decamp, adenomatous polyps, repeat 3 years   . EGD with dilatation  02/17/09   Barretts esophagus   . heart stent    . Left knee surgery    . left knee surgery    . LUMBAR LAMINECTOMY/DECOMPRESSION MICRODISCECTOMY Right 10/07/2017   Procedure: RIGHT LUMBAR ONE-TWO LAMINECTOMY WITH MICRODISCECTOMY;  Surgeon: Consuella Lose, MD;  Location: [place];  Service: Neurosurgery;  Laterality: Right;  . TONSILLECTOMY      Medications Prior to Admission  Medication Sig Dispense Refill Last Dose  . aspirin EC 81 MG tablet Take 1 tablet (81 mg total) by mouth daily.  Past Month at Unknown time  . cetirizine (ZYRTEC) 10 MG tablet Take 10 mg by mouth daily.   Past Month at Unknown time  . isosorbide mononitrate (IMDUR) 30 MG 24 hr tablet Take 1 tablet (30 mg total) by mouth daily. 90 tablet 3 12/05/2017 at Unknown time  . leflunomide (ARAVA) 20 MG tablet Take 1 tablet (20 mg total) by mouth 2 (two) times daily. (Patient taking differently: Take 20 mg by mouth every other day. ) 2 tablet 0 Past Week at Unknown time  . metoprolol succinate (TOPROL-XL) 25 MG 24 hr tablet TAKE 1 TABLET BY MOUTH DAILY  (Patient taking differently: Take 25 mg by mouth daily. ) 90 tablet 1 12/05/2017 at Unknown time  . Multiple Vitamin (MULTIVITAMIN WITH MINERALS) TABS Take 1 tablet by mouth daily.   Past Month at Unknown time  . naproxen (NAPROSYN) 500 MG tablet Take 500 mg by mouth 2 (two) times daily.   Past Week at Unknown time  . omeprazole (PRILOSEC) 40 MG capsule Take 40 mg by mouth daily.   12/05/2017 at Unknown time  . predniSONE (DELTASONE) 5 MG tablet Take 5 mg by mouth daily as needed (arthritis).    Past Week at Unknown time  . simvastatin (ZOCOR) 40 MG tablet Take 1 tablet (40 mg total) by mouth daily. (Patient taking differently: Take 40 mg by mouth every evening. ) 90 tablet 3 Past Week at Unknown time  . tamsulosin (FLOMAX) 0.4 MG CAPS capsule TAKE ONE CAPSULE BY MOUTH DAILY (Patient taking differently: Take 0.4 mg by mouth daily. ) 90 capsule 3 12/05/2017 at Unknown time  . zolpidem (AMBIEN) 10 MG tablet TAKE ONE TABLET BY MOUTH EVERY NIGHT AT BEDTIME (Patient taking differently: Take 10 mg by mouth at bedtime. ) 30 tablet 5 Past Week at Unknown time  . cyclobenzaprine (FLEXERIL) 10 MG tablet Take 1 tablet (10 mg total) by mouth 2 (two) times daily as needed for muscle spasms. (Patient not taking: Reported on 12/05/2017) 20 tablet 0 Not Taking at Unknown time  . diclofenac sodium (VOLTAREN) 1 % GEL Apply 2 g topically 4 (four) times daily. (Patient not taking: Reported on 12/05/2017) 1 Tube 0 Completed Course at Unknown time  . methocarbamol (ROBAXIN) 500 MG tablet Take 1 tablet (500 mg total) by mouth every 6 (six) hours as needed for muscle spasms. (Patient not taking: Reported on 12/05/2017) 60 tablet 2 Completed Course at Unknown time  . nitroGLYCERIN (NITROSTAT) 0.4 MG SL tablet Place 1 tablet (0.4 mg total) under the tongue every 5 (five) minutes as needed for chest pain (3 doses max). 25 tablet 3 Unknown at Unknown time  . oxyCODONE-acetaminophen (PERCOCET) 7.5-325 MG tablet Take 1 tablet by  mouth every 4 (four) hours as needed for severe pain. (Patient not taking: Reported on 12/05/2017) 60 tablet 0 Completed Course at Unknown time  . sildenafil (REVATIO) 20 MG tablet Take 20 mg by mouth daily as needed (Patient taking differently: Take 20 mg by mouth daily as needed (erectile disfunction). ) 50 tablet 11 Unknown at Unknown time    SH: Social History   Tobacco Use  . Smoking status: Former Smoker    Packs/day: 1.00    Years: 20.00    Pack years: 20.00    Types: Cigarettes    Last attempt to quit: 10/12/1988    Years since quitting: 29.1  . Smokeless tobacco: Never Used  . Tobacco comment: 1960's   Substance Use Topics  . Alcohol use: Yes  Alcohol/week: 0.0 standard drinks    Comment: rare  . Drug use: No    MEDS: Prior to Admission medications   Medication Sig Start Date End Date Taking? Authorizing Provider  aspirin EC 81 MG tablet Take 1 tablet (81 mg total) by mouth daily. 10/14/17  Yes Kemiya Batdorf, Vista Mink, PA-C  cetirizine (ZYRTEC) 10 MG tablet Take 10 mg by mouth daily.   Yes [provider]  isosorbide mononitrate (IMDUR) 30 MG 24 hr tablet Take 1 tablet (30 mg total) by mouth daily. 01/26/16 12/05/17 Yes Eileen Stanford, PA-C  leflunomide (ARAVA) 20 MG tablet Take 1 tablet (20 mg total) by mouth 2 (two) times daily. Patient taking differently: Take 20 mg by mouth every other day.  12/05/16  Yes [name], MD  metoprolol succinate (TOPROL-XL) 25 MG 24 hr tablet TAKE 1 TABLET BY MOUTH DAILY Patient taking differently: Take 25 mg by mouth daily.  11/04/17  Yes [name], MD  Multiple Vitamin (MULTIVITAMIN WITH MINERALS) TABS Take 1 tablet by mouth daily.   Yes [provider]  naproxen (NAPROSYN) 500 MG tablet Take 500 mg by mouth 2 (two) times daily. 05/02/16  Yes [provider]  omeprazole (PRILOSEC) 40 MG capsule Take 40 mg by mouth daily.   Yes [provider]  predniSONE (DELTASONE) 5 MG tablet Take 5 mg by  mouth daily as needed (arthritis).  09/21/17  Yes [provider]  simvastatin (ZOCOR) 40 MG tablet Take 1 tablet (40 mg total) by mouth daily. Patient taking differently: Take 40 mg by mouth every evening.  12/05/16  Yes [name], MD  tamsulosin (FLOMAX) 0.4 MG CAPS capsule TAKE ONE CAPSULE BY MOUTH DAILY Patient taking differently: Take 0.4 mg by mouth daily.  05/07/17  Yes [name], MD  zolpidem (AMBIEN) 10 MG tablet TAKE ONE TABLET BY MOUTH EVERY NIGHT AT BEDTIME Patient taking differently: Take 10 mg by mouth at bedtime.  11/08/17  Yes [name], MD  cyclobenzaprine (FLEXERIL) 10 MG tablet Take 1 tablet (10 mg total) by mouth 2 (two) times daily as needed for muscle spasms. Patient not taking: Reported on 12/05/2017 10/06/17   Providence Lanius A, PA-C  diclofenac sodium (VOLTAREN) 1 % GEL Apply 2 g topically 4 (four) times daily. Patient not taking: Reported on 12/05/2017 10/06/17   Volanda Napoleon, PA-C  methocarbamol (ROBAXIN) 500 MG tablet Take 1 tablet (500 mg total) by mouth every 6 (six) hours as needed for muscle spasms. Patient not taking: Reported on 12/05/2017 10/08/17   Bari Leib, Vista Mink, PA-C  nitroGLYCERIN (NITROSTAT) 0.4 MG SL tablet Place 1 tablet (0.4 mg total) under the tongue every 5 (five) minutes as needed for chest pain (3 doses max). 12/27/14   Josue Hector, MD  oxyCODONE-acetaminophen (PERCOCET) 7.5-325 MG tablet Take 1 tablet by mouth every 4 (four) hours as needed for severe pain. Patient not taking: Reported on 12/05/2017 10/08/17   Traci Sermon, PA-C  sildenafil (REVATIO) 20 MG tablet Take 20 mg by mouth daily as needed Patient taking differently: Take 20 mg by mouth daily as needed (erectile disfunction).  02/22/17   [name], MD    ALLERGY: Allergies  Allergen Reactions  . Cephalexin Shortness Of Breath, Rash and Other (See Comments)    PATIENT HAS HAD A PCN REACTION WITH IMMEDIATE RASH, FACIAL/TONGUE/THROAT SWELLING, SOB, OR  LIGHTHEADEDNESS WITH HYPOTENSION:  #  #  YES  #  #  Has patient had  a PCN reaction causing severe rash involving mucus membranes or skin necrosis: No Has patient had a PCN reaction that required hospitalization: No Has patient had a PCN reaction occurring within the last 10 years: No If all of the above answers are "NO", then may proceed with Cephalosporin use.   . Clarithromycin Shortness Of Breath and Rash  . Certolizumab Pegol Hives  . Doxycycline Rash  . Hydroxyzine Rash  . Lidoderm Other (See Comments)    "made me act weird"  . Pyrithione Zinc Rash    Social History   Tobacco Use  . Smoking status: Former Smoker    Packs/day: 1.00    Years: 20.00    Pack years: 20.00    Types: Cigarettes    Last attempt to quit: 10/12/1988    Years since quitting: 29.1  . Smokeless tobacco: Never Used  . Tobacco comment: 1960's   Substance Use Topics  . Alcohol use: Yes    Alcohol/week: 0.0 standard drinks    Comment: rare     Family History  Problem Relation Age of Onset  . Heart attack Father        cardiovascular disorder  . Arthritis Father        family hx  . Colon cancer Father        mets  . Prostate cancer Father        1st degree relative  . Arthritis Mother   . Diabetes Paternal Uncle   . Colon cancer Paternal Uncle   . Cancer Maternal Grandmother   . Skin cancer Daughter   . Esophageal cancer Neg Hx   . Stomach cancer Neg Hx   . Rectal cancer Neg Hx      ROS   ROS  Exam   Vitals:   12/06/17 1203 12/06/17 1311  BP: (!) 158/102 (!) 153/94  Pulse:    Resp:    Temp:    SpO2:     General appearance: WDWN, NAD Eyes: No scleral injection Cardiovascular: Regular rate and rhythm without murmurs, rubs, gallops. No edema or variciosities. Distal pulses normal. Pulmonary: Effort normal, non-labored breathing Musculoskeletal:     Muscle tone upper extremities: Normal    Muscle tone lower extremities: Normal    Motor exam: Upper Extremities Deltoid Bicep  Tricep Grip  Right 5/5 5/5 5/5 5/5  Left 5/5 5/5 5/5 5/5   Lower Extremity IP Quad PF DF EHL  Right 5/5 5/5 5/5 5/5 5/5  Left 5/5 5/5 5/5 5/5 5/5   Neurological Mental Status:    - Patient is awake, alert, oriented to person, place, month, year, and situation    - Patient is able to give a clear and coherent history.    - No signs of aphasia or neglect Cranial Nerves    - II: Visual Fields are full. PERRL    - III/IV/VI: EOMI without ptosis or diploplia.     - V: Facial sensation is grossly normal    - VII: Facial movement is symmetric.     - VIII: hearing is intact to voice    - X: Uvula elevates symmetrically    - XI: Shoulder shrug is symmetric.    - XII: tongue is midline without atrophy or fasciculations.  Sensory: Sensation grossly intact to LT Deep Tendon Reflexes    - 2+ and symmetric in the biceps and patellae.  Plantars   - Toes are downgoing bilaterally.  Cerebellar    - FNF and HKS are intact bilaterally  Results - Imaging/Labs   No results found for this or any previous visit (from the past 48 hour(s)).  No results found.   Impression/Plan   72 y.o. male with large recurrent right inferiorly migrated right sided disc bulge at L1-2. He presents today for repeat right sided microdiscectomy.  While in the office risks, benefits and alternatives were discussed. He stated in own language understanding and wished to proceed.

## 2017-12-06 NOTE — Anesthesia Preprocedure Evaluation (Signed)
Anesthesia Evaluation  Patient identified by MRN, date of birth, ID band Patient awake    Reviewed: Allergy & Precautions, NPO status , Patient's Chart, lab work & pertinent test results  Airway Mallampati: II  TM Distance: >3 FB Neck ROM: Full    Dental no notable dental hx.    Pulmonary COPD, former smoker,    Pulmonary exam normal breath sounds clear to auscultation       Cardiovascular hypertension, + CAD, + Past MI and + Cardiac Stents  Normal cardiovascular exam Rhythm:Regular Rate:Normal     Neuro/Psych negative neurological ROS  negative psych ROS   GI/Hepatic negative GI ROS, Neg liver ROS,   Endo/Other  negative endocrine ROS  Renal/GU negative Renal ROS  negative genitourinary   Musculoskeletal  (+) Arthritis , Rheumatoid disorders,    Abdominal   Peds negative pediatric ROS (+)  Hematology negative hematology ROS (+)   Anesthesia Other Findings   Reproductive/Obstetrics negative OB ROS                             Anesthesia Physical Anesthesia Plan  ASA: III  Anesthesia Plan: General   Post-op Pain Management:    Induction: Intravenous  PONV Risk Score and Plan: 2 and Ondansetron, Dexamethasone and Treatment may vary due to age or medical condition  Airway Management Planned: Oral ETT  Additional Equipment:   Intra-op Plan:   Post-operative Plan: Extubation in OR  Informed Consent: I have reviewed the patients History and Physical, chart, labs and discussed the procedure including the risks, benefits and alternatives for the proposed anesthesia with the patient or authorized representative who has indicated his/her understanding and acceptance.   Dental advisory given  Plan Discussed with: CRNA and Surgeon  Anesthesia Plan Comments:         Anesthesia Quick Evaluation

## 2017-12-06 NOTE — Progress Notes (Signed)
Pt was discharged home accompanied by spouse; ambulatory, in no acute pain or distress; d/c instructions given by RN, pt and spouse verbalized understanding.

## 2017-12-06 NOTE — Progress Notes (Signed)
Pharmacy Antibiotic Note  Tyrone Roman is a 72 y.o. male admitted on 12/06/2017 with surgical prophylaxis.  Pharmacy has been consulted for vanc dosing.  Pt received his pre-op vanc this AM at 1230. He is s/p laminectomy without drain in place. We will give 1 dose of vanc post op.   Scr 1.05, CrCl 80  Plan: Vanc 1g x1 12hr post op Rx sign off  Height: 6\' 2"  (188 cm) Weight: 218 lb (98.9 kg) IBW/kg (Calculated) : 82.2  Temp (24hrs), Avg:97.6 F (36.4 C), Min:97.5 F (36.4 C), Max:97.6 F (36.4 C)  Recent Labs  Lab 12/06/17 1237  WBC 4.6  CREATININE 1.05    Estimated Creatinine Clearance: 80 mL/min (by C-G formula based on SCr of 1.05 mg/dL).    Allergies  Allergen Reactions  . Cephalexin Shortness Of Breath, Rash and Other (See Comments)    PATIENT HAS HAD A PCN REACTION WITH IMMEDIATE RASH, FACIAL/TONGUE/THROAT SWELLING, SOB, OR LIGHTHEADEDNESS WITH HYPOTENSION:  #  #  YES  #  #  Has patient had a PCN reaction causing severe rash involving mucus membranes or skin necrosis: No Has patient had a PCN reaction that required hospitalization: No Has patient had a PCN reaction occurring within the last 10 years: No If all of the above answers are "NO", then may proceed with Cephalosporin use.   . Clarithromycin Shortness Of Breath and Rash  . Certolizumab Pegol Hives  . Doxycycline Rash  . Hydroxyzine Rash  . Lidoderm Other (See Comments)    "made me act weird"  . Pyrithione Zinc Rash   Onnie Boer, PharmD, [place], [place], CPP Infectious Disease Pharmacist Pager: [phone] 12/06/2017 4:17 PM

## 2017-12-06 NOTE — Discharge Instructions (Signed)
Wound Care °Keep incision covered and dry for two days.   °Do not put any creams, lotions, or ointments on incision. °Leave steri-strips on back.  They will fall off by themselves. °Activity °Walk each and every day, increasing distance each day. °No lifting greater than 5 lbs.  Avoid bending, lifting and twisting. °No driving for 2 weeks; may ride as a passenger locally. °Diet °Resume your normal diet.  °Return to Work °Will be discussed at you follow up appointment. °Call Your Doctor If Any of These Occur °Redness, drainage, or swelling at the wound.  °Temperature greater than 101 degrees. °Severe pain not relieved by pain medication. °Incision starts to come apart. °Follow Up Appt °Call today for appointment in 1-2 weeks ([phone]) or for problems.  If you have any hardware placed in your spine, you will need an x-ray before your appointment. ° °

## 2017-12-06 NOTE — Op Note (Signed)
NEUROSURGERY OPERATIVE NOTE   PREOP DIAGNOSIS: Recurrent lumbar disc herniation, L1-2  POSTOP DIAGNOSIS: Same  PROCEDURE: 1. Right L1-2 laminotomy with facetectomy, microdiscectomy for decompression of nerve root 2. Use of operating microscope  SURGEON: Dr. Consuella Lose, MD  ASSISTANT: Ferne Reus, PA-C  ANESTHESIA: General Endotracheal  EBL: 50cc  SPECIMENS: None  DRAINS: None  COMPLICATIONS: None immediate  CONDITION: Hemodynamically stable to PACU  HISTORY: Tyrone Roman is a 72 y.o. male usually who initially underwent right sided L1 to laminotomy and microdiscectomy for a fairly large disc herniation with radiculopathy about 2 months ago.  He had excellent pain relief for about 1 month, but over the last several weeks has complained of recurrent right-sided back and thigh pain, in addition to some symptoms of urinary incontinence.  His MRI demonstrated significant disc reherniation with inferior migration behind the L2 vertebral body.  With his recurrent symptoms radiographic findings and bladder symptoms, I did recommend relatively urgent repeat operation for microdiscectomy.  The risks and benefits were reviewed in detail with the patient in the office yesterday.  After all his questions were answered informed consent was obtained and witnessed.  PROCEDURE IN DETAIL: After informed consent was obtained and witnessed, the patient was brought to the operating room. After induction of general anesthesia, the patient was positioned on the operative table in the prone position with all pressure points meticulously padded. The skin of the low back was then prepped and draped in the usual sterile fashion.  After timeout was conducted, previous skin incision was then made sharply and Bovie electrocautery was used to dissect the subcutaneous tissue until the lumbodorsal fascia was identified. The fascia was then incised using Bovie electrocautery and the lamina at the L1  and L2 levels was identified and dissection was carried out laterally to identify the lateral edge of the previously made laminotomy.  The right L2 pars was noted to be fractured.  I therefore removed a portion of the inferior articulating process that was free-floating.  The edges of the previous laminotomy were then further delineated.  High-speed drill was then used to further remove a portion of the right L1 to facet complex.  I was then able to identify the lateral edge of the thecal sac.  Using ball-tipped hooks, I was able to dissect in the ventral epidural space.  The L1-2 disc space was identified, and just inferior to this I found very large free disc fragments which were compressing the thecal sac and the right sided L2 nerve root.  Using a combination of these ball-tipped dissectors, multiple free fragments were removed from behind the L2 vertebral body.  During removal of disc fragments underneath the right L2 nerve root just medial and inferior to the right L2 pedicle, I noted some egress of CSF while completing the discectomy.  I was able to freely pass a ball-tipped dissector within the ventral epidural space and out the right L1 to foramen indicating good decompression.  At this point, hemostasis was secured with a combination of bipolar electrocautery and morselized Gelfoam and thrombin.  I then placed a small piece of collagen DuraMatrix onlay graft in the ventral epidural space and around the lateral and ventral aspect of the right L2 nerve root.  This was then covered with a layer of polyethylene glycol dural sealant.  The wound was then closed in multiple layers with a combination of 0 and 3-0 Vicryl stitches.  Skin was closed with interrupted 3-0 Vicryl subcuticular stitch.  Dermabond was  then applied.  Once the dermabond dried a sterile dressing was applied.  The patient was then transferred to the stretcher, extubated, and taken to the postanesthesia care unit in stable hemodynamic  condition.  At the end of the case all sponge, needle, instrument, and cottonoid counts were correct.

## 2017-12-06 NOTE — Discharge Summary (Signed)
Physician Discharge Summary  Patient ID: Tyrone Roman MRN: [id number] DOB/AGE: Oct 17, 1945 72 y.o.  Admit date: 12/06/2017 Discharge date: 12/06/2017  Admission Diagnoses:  lumbar HNP  Discharge Diagnoses:  Same Active Problems:   Lumbar herniated disc   Discharged Condition: Stable  Hospital Course:  Tyrone Roman is a 72 y.o. male who was admitted for the below procedure. There were no post operative complications. At time of discharge, pain was well controlled, ambulating with Pt/OT, tolerating po, voiding normal. Ready for discharge.  Treatments: Surgery - L1-2 right laminotomy microdiscectomy  Discharge Exam: Blood pressure (!) 161/98, pulse 62, temperature 97.9 F (36.6 C), temperature source Oral, resp. rate 14, height 6\' 2"  (1.88 m), weight 98.9 kg, SpO2 100 %. Awake, alert, oriented Speech fluent, appropriate CN grossly intact 5/5 BUE/BLE Wound c/d/i  Disposition: Discharge disposition: 01-Home or Self Care       Discharge Instructions    Call MD for:  difficulty breathing, headache or visual disturbances   Complete by:  As directed    Call MD for:  persistant dizziness or light-headedness   Complete by:  As directed    Call MD for:  redness, tenderness, or signs of infection (pain, swelling, redness, odor or green/yellow discharge around incision site)   Complete by:  As directed    Call MD for:  severe uncontrolled pain   Complete by:  As directed    Call MD for:  temperature >100.4   Complete by:  As directed    Diet general   Complete by:  As directed    Driving Restrictions   Complete by:  As directed    Do not drive until given clearance.   Increase activity slowly   Complete by:  As directed    Lifting restrictions   Complete by:  As directed    Do not lift anything >10lbs. Avoid bending and twisting in awkward positions. Avoid bending at the back.   May shower / Bathe   Complete by:  As directed    In 24 hours. Okay to wash wound with  warm soapy water. Avoid scrubbing the wound. Pat dry.   Remove dressing in 24 hours   Complete by:  As directed      Allergies as of 12/06/2017      Reactions   Cephalexin Shortness Of Breath, Rash, Other (See Comments)   PATIENT HAS HAD A PCN REACTION WITH IMMEDIATE RASH, FACIAL/TONGUE/THROAT SWELLING, SOB, OR LIGHTHEADEDNESS WITH HYPOTENSION:  #  #  YES  #  #  Has patient had a PCN reaction causing severe rash involving mucus membranes or skin necrosis: No Has patient had a PCN reaction that required hospitalization: No Has patient had a PCN reaction occurring within the last 10 years: No If all of the above answers are "NO", then may proceed with Cephalosporin use.   Clarithromycin Shortness Of Breath, Rash   Certolizumab Pegol Hives   Doxycycline Rash   Hydroxyzine Rash   Lidoderm Other (See Comments)   "made me act weird"   Pyrithione Zinc Rash      Medication List    STOP taking these medications   cyclobenzaprine 10 MG tablet Commonly known as:  FLEXERIL   diclofenac sodium 1 % Gel Commonly known as:  VOLTAREN   methocarbamol 500 MG tablet Commonly known as:  ROBAXIN   oxyCODONE-acetaminophen 7.5-325 MG tablet Commonly known as:  PERCOCET     TAKE these medications   aspirin EC 81 MG tablet Take  1 tablet (81 mg total) by mouth daily. Start taking on:  12/11/2017 What changed:  These instructions start on 12/11/2017. If you are unsure what to do until then, ask your doctor or other care provider.   cetirizine 10 MG tablet Commonly known as:  ZYRTEC Take 10 mg by mouth daily.   isosorbide mononitrate 30 MG 24 hr tablet Commonly known as:  IMDUR Take 1 tablet (30 mg total) by mouth daily.   leflunomide 20 MG tablet Commonly known as:  ARAVA Take 1 tablet (20 mg total) by mouth 2 (two) times daily. What changed:  when to take this   metoprolol succinate 25 MG 24 hr tablet Commonly known as:  TOPROL-XL TAKE 1 TABLET BY MOUTH DAILY   multivitamin with  minerals Tabs tablet Take 1 tablet by mouth daily.   naproxen 500 MG tablet Commonly known as:  NAPROSYN Take 500 mg by mouth 2 (two) times daily.   nitroGLYCERIN 0.4 MG SL tablet Commonly known as:  NITROSTAT Place 1 tablet (0.4 mg total) under the tongue every 5 (five) minutes as needed for chest pain (3 doses max).   omeprazole 40 MG capsule Commonly known as:  PRILOSEC Take 40 mg by mouth daily.   predniSONE 5 MG tablet Commonly known as:  DELTASONE Take 5 mg by mouth daily as needed (arthritis).   sildenafil 20 MG tablet Commonly known as:  REVATIO Take 20 mg by mouth daily as needed What changed:    how much to take  how to take this  when to take this  reasons to take this  additional instructions   simvastatin 40 MG tablet Commonly known as:  ZOCOR Take 1 tablet (40 mg total) by mouth daily. What changed:  when to take this   tamsulosin 0.4 MG Caps capsule Commonly known as:  FLOMAX TAKE ONE CAPSULE BY MOUTH DAILY   zolpidem 10 MG tablet Commonly known as:  AMBIEN TAKE ONE TABLET BY MOUTH EVERY NIGHT AT BEDTIME      Follow-up Information    Consuella Lose, MD Follow up.   Specialty:  Neurosurgery Contact information: [address] [place] [address] [phone]           Signed: Traci Sermon 12/06/2017, 7:23 PM

## 2017-12-06 NOTE — Anesthesia Procedure Notes (Signed)
Procedure Name: Intubation Date/Time: 12/06/2017 2:07 PM Performed by: Wilburn Cornelia, CRNA Pre-anesthesia Checklist: Patient identified, Emergency Drugs available, Suction available, Patient being monitored and Timeout performed Patient Re-evaluated:Patient Re-evaluated prior to induction Oxygen Delivery Method: Circle system utilized Preoxygenation: Pre-oxygenation with 100% oxygen Induction Type: IV induction Ventilation: Mask ventilation without difficulty Laryngoscope Size: Mac and 4 Grade View: Grade II Tube type: Oral Tube size: 7.5 mm Number of attempts: 1 Airway Equipment and Method: Stylet Placement Confirmation: ETT inserted through vocal cords under direct vision,  positive ETCO2,  CO2 detector and breath sounds checked- equal and bilateral Secured at: 23 cm Tube secured with: Tape Dental Injury: Teeth and Oropharynx as per pre-operative assessment

## 2017-12-09 ENCOUNTER — Encounter (HOSPITAL_COMMUNITY): Payer: Self-pay | Admitting: Neurosurgery

## 2017-12-10 ENCOUNTER — Encounter (HOSPITAL_COMMUNITY): Payer: Self-pay | Admitting: Neurology

## 2017-12-10 ENCOUNTER — Emergency Department (HOSPITAL_COMMUNITY): Payer: Medicare Other

## 2017-12-10 ENCOUNTER — Inpatient Hospital Stay (HOSPITAL_COMMUNITY)
Admission: EM | Admit: 2017-12-10 | Discharge: 2017-12-17 | DRG: 856 | Disposition: A | Discharge status: Home Health | Payer: Medicare Other | Attending: Internal Medicine | Admitting: Internal Medicine

## 2017-12-10 DIAGNOSIS — Z87442 Personal history of urinary calculi: Secondary | ICD-10-CM

## 2017-12-10 DIAGNOSIS — H269 Unspecified cataract: Secondary | ICD-10-CM | POA: Diagnosis present

## 2017-12-10 DIAGNOSIS — M069 Rheumatoid arthritis, unspecified: Secondary | ICD-10-CM | POA: Diagnosis present

## 2017-12-10 DIAGNOSIS — I25118 Atherosclerotic heart disease of native coronary artery with other forms of angina pectoris: Secondary | ICD-10-CM | POA: Diagnosis not present

## 2017-12-10 DIAGNOSIS — M5126 Other intervertebral disc displacement, lumbar region: Secondary | ICD-10-CM | POA: Diagnosis not present

## 2017-12-10 DIAGNOSIS — Z791 Long term (current) use of non-steroidal anti-inflammatories (NSAID): Secondary | ICD-10-CM | POA: Diagnosis not present

## 2017-12-10 DIAGNOSIS — K2270 Barrett's esophagus without dysplasia: Secondary | ICD-10-CM | POA: Diagnosis present

## 2017-12-10 DIAGNOSIS — R404 Transient alteration of awareness: Secondary | ICD-10-CM | POA: Diagnosis not present

## 2017-12-10 DIAGNOSIS — Z881 Allergy status to other antibiotic agents status: Secondary | ICD-10-CM | POA: Diagnosis not present

## 2017-12-10 DIAGNOSIS — G92 Toxic encephalopathy: Secondary | ICD-10-CM | POA: Diagnosis present

## 2017-12-10 DIAGNOSIS — K5903 Drug induced constipation: Secondary | ICD-10-CM | POA: Diagnosis present

## 2017-12-10 DIAGNOSIS — R51 Headache: Secondary | ICD-10-CM | POA: Diagnosis not present

## 2017-12-10 DIAGNOSIS — E872 Acidosis: Secondary | ICD-10-CM | POA: Diagnosis present

## 2017-12-10 DIAGNOSIS — I5030 Unspecified diastolic (congestive) heart failure: Secondary | ICD-10-CM | POA: Diagnosis not present

## 2017-12-10 DIAGNOSIS — E861 Hypovolemia: Secondary | ICD-10-CM | POA: Diagnosis present

## 2017-12-10 DIAGNOSIS — Z87891 Personal history of nicotine dependence: Secondary | ICD-10-CM | POA: Diagnosis not present

## 2017-12-10 DIAGNOSIS — T8144XA Sepsis following a procedure, initial encounter: Secondary | ICD-10-CM | POA: Diagnosis present

## 2017-12-10 DIAGNOSIS — N179 Acute kidney failure, unspecified: Secondary | ICD-10-CM | POA: Diagnosis present

## 2017-12-10 DIAGNOSIS — Z79899 Other long term (current) drug therapy: Secondary | ICD-10-CM

## 2017-12-10 DIAGNOSIS — Z888 Allergy status to other drugs, medicaments and biological substances status: Secondary | ICD-10-CM

## 2017-12-10 DIAGNOSIS — R410 Disorientation, unspecified: Secondary | ICD-10-CM | POA: Diagnosis not present

## 2017-12-10 DIAGNOSIS — R42 Dizziness and giddiness: Secondary | ICD-10-CM | POA: Diagnosis not present

## 2017-12-10 DIAGNOSIS — R531 Weakness: Secondary | ICD-10-CM | POA: Diagnosis not present

## 2017-12-10 DIAGNOSIS — I129 Hypertensive chronic kidney disease with stage 1 through stage 4 chronic kidney disease, or unspecified chronic kidney disease: Secondary | ICD-10-CM | POA: Diagnosis not present

## 2017-12-10 DIAGNOSIS — X58XXXA Exposure to other specified factors, initial encounter: Secondary | ICD-10-CM | POA: Diagnosis present

## 2017-12-10 DIAGNOSIS — Z955 Presence of coronary angioplasty implant and graft: Secondary | ICD-10-CM | POA: Diagnosis not present

## 2017-12-10 DIAGNOSIS — B9561 Methicillin susceptible Staphylococcus aureus infection as the cause of diseases classified elsewhere: Secondary | ICD-10-CM | POA: Diagnosis not present

## 2017-12-10 DIAGNOSIS — Z833 Family history of diabetes mellitus: Secondary | ICD-10-CM

## 2017-12-10 DIAGNOSIS — R6520 Severe sepsis without septic shock: Secondary | ICD-10-CM | POA: Diagnosis present

## 2017-12-10 DIAGNOSIS — T8141XA Infection following a procedure, superficial incisional surgical site, initial encounter: Secondary | ICD-10-CM | POA: Diagnosis present

## 2017-12-10 DIAGNOSIS — A4101 Sepsis due to Methicillin susceptible Staphylococcus aureus: Secondary | ICD-10-CM | POA: Diagnosis not present

## 2017-12-10 DIAGNOSIS — I10 Essential (primary) hypertension: Secondary | ICD-10-CM | POA: Diagnosis not present

## 2017-12-10 DIAGNOSIS — R4182 Altered mental status, unspecified: Secondary | ICD-10-CM | POA: Diagnosis not present

## 2017-12-10 DIAGNOSIS — R0902 Hypoxemia: Secondary | ICD-10-CM | POA: Diagnosis not present

## 2017-12-10 DIAGNOSIS — I252 Old myocardial infarction: Secondary | ICD-10-CM | POA: Diagnosis not present

## 2017-12-10 DIAGNOSIS — I444 Left anterior fascicular block: Secondary | ICD-10-CM | POA: Diagnosis present

## 2017-12-10 DIAGNOSIS — Z8719 Personal history of other diseases of the digestive system: Secondary | ICD-10-CM

## 2017-12-10 DIAGNOSIS — A419 Sepsis, unspecified organism: Secondary | ICD-10-CM | POA: Diagnosis not present

## 2017-12-10 DIAGNOSIS — Y838 Other surgical procedures as the cause of abnormal reaction of the patient, or of later complication, without mention of misadventure at the time of the procedure: Secondary | ICD-10-CM | POA: Diagnosis present

## 2017-12-10 DIAGNOSIS — H532 Diplopia: Secondary | ICD-10-CM | POA: Diagnosis present

## 2017-12-10 DIAGNOSIS — Z880 Allergy status to penicillin: Secondary | ICD-10-CM

## 2017-12-10 DIAGNOSIS — R1310 Dysphagia, unspecified: Secondary | ICD-10-CM | POA: Diagnosis present

## 2017-12-10 DIAGNOSIS — K219 Gastro-esophageal reflux disease without esophagitis: Secondary | ICD-10-CM | POA: Diagnosis not present

## 2017-12-10 DIAGNOSIS — J449 Chronic obstructive pulmonary disease, unspecified: Secondary | ICD-10-CM | POA: Diagnosis present

## 2017-12-10 DIAGNOSIS — E782 Mixed hyperlipidemia: Secondary | ICD-10-CM | POA: Diagnosis not present

## 2017-12-10 DIAGNOSIS — R000 Tachycardia, unspecified: Secondary | ICD-10-CM | POA: Diagnosis not present

## 2017-12-10 DIAGNOSIS — Z8249 Family history of ischemic heart disease and other diseases of the circulatory system: Secondary | ICD-10-CM

## 2017-12-10 DIAGNOSIS — Z8261 Family history of arthritis: Secondary | ICD-10-CM

## 2017-12-10 DIAGNOSIS — Z7982 Long term (current) use of aspirin: Secondary | ICD-10-CM

## 2017-12-10 DIAGNOSIS — T8149XD Infection following a procedure, other surgical site, subsequent encounter: Secondary | ICD-10-CM | POA: Diagnosis not present

## 2017-12-10 DIAGNOSIS — I25119 Atherosclerotic heart disease of native coronary artery with unspecified angina pectoris: Secondary | ICD-10-CM | POA: Diagnosis not present

## 2017-12-10 DIAGNOSIS — T8140XA Infection following a procedure, unspecified, initial encounter: Secondary | ICD-10-CM | POA: Diagnosis not present

## 2017-12-10 DIAGNOSIS — Z8711 Personal history of peptic ulcer disease: Secondary | ICD-10-CM

## 2017-12-10 DIAGNOSIS — R7881 Bacteremia: Secondary | ICD-10-CM | POA: Diagnosis not present

## 2017-12-10 DIAGNOSIS — E785 Hyperlipidemia, unspecified: Secondary | ICD-10-CM | POA: Diagnosis present

## 2017-12-10 DIAGNOSIS — N400 Enlarged prostate without lower urinary tract symptoms: Secondary | ICD-10-CM | POA: Diagnosis present

## 2017-12-10 DIAGNOSIS — Z7952 Long term (current) use of systemic steroids: Secondary | ICD-10-CM

## 2017-12-10 DIAGNOSIS — G9341 Metabolic encephalopathy: Secondary | ICD-10-CM | POA: Diagnosis not present

## 2017-12-10 DIAGNOSIS — T402X5A Adverse effect of other opioids, initial encounter: Secondary | ICD-10-CM | POA: Diagnosis present

## 2017-12-10 DIAGNOSIS — H539 Unspecified visual disturbance: Secondary | ICD-10-CM | POA: Diagnosis not present

## 2017-12-10 DIAGNOSIS — T8142XA Infection following a procedure, deep incisional surgical site, initial encounter: Secondary | ICD-10-CM | POA: Diagnosis not present

## 2017-12-10 DIAGNOSIS — G834 Cauda equina syndrome: Secondary | ICD-10-CM | POA: Diagnosis not present

## 2017-12-10 DIAGNOSIS — K5790 Diverticulosis of intestine, part unspecified, without perforation or abscess without bleeding: Secondary | ICD-10-CM | POA: Diagnosis not present

## 2017-12-10 DIAGNOSIS — I493 Ventricular premature depolarization: Secondary | ICD-10-CM | POA: Diagnosis not present

## 2017-12-10 DIAGNOSIS — N189 Chronic kidney disease, unspecified: Secondary | ICD-10-CM | POA: Diagnosis not present

## 2017-12-10 DIAGNOSIS — M549 Dorsalgia, unspecified: Secondary | ICD-10-CM | POA: Diagnosis not present

## 2017-12-10 DIAGNOSIS — J439 Emphysema, unspecified: Secondary | ICD-10-CM | POA: Diagnosis not present

## 2017-12-10 DIAGNOSIS — Z8042 Family history of malignant neoplasm of prostate: Secondary | ICD-10-CM

## 2017-12-10 DIAGNOSIS — H538 Other visual disturbances: Secondary | ICD-10-CM | POA: Diagnosis not present

## 2017-12-10 DIAGNOSIS — R109 Unspecified abdominal pain: Secondary | ICD-10-CM | POA: Diagnosis not present

## 2017-12-10 DIAGNOSIS — I2510 Atherosclerotic heart disease of native coronary artery without angina pectoris: Secondary | ICD-10-CM | POA: Diagnosis not present

## 2017-12-10 DIAGNOSIS — Z978 Presence of other specified devices: Secondary | ICD-10-CM | POA: Diagnosis not present

## 2017-12-10 DIAGNOSIS — M0579 Rheumatoid arthritis with rheumatoid factor of multiple sites without organ or systems involvement: Secondary | ICD-10-CM | POA: Diagnosis not present

## 2017-12-10 DIAGNOSIS — Z808 Family history of malignant neoplasm of other organs or systems: Secondary | ICD-10-CM

## 2017-12-10 DIAGNOSIS — Z800 Family history of malignant neoplasm of digestive organs: Secondary | ICD-10-CM

## 2017-12-10 DIAGNOSIS — R4781 Slurred speech: Secondary | ICD-10-CM | POA: Diagnosis present

## 2017-12-10 LAB — COMPREHENSIVE METABOLIC PANEL
ALT: 32 U/L (ref 0–44)
AST: 58 U/L — ABNORMAL HIGH (ref 15–41)
Albumin: 2.9 g/dL — ABNORMAL LOW (ref 3.5–5.0)
Alkaline Phosphatase: 48 U/L (ref 38–126)
Anion gap: 12 (ref 5–15)
BUN: 48 mg/dL — ABNORMAL HIGH (ref 8–23)
CO2: 24 mmol/L (ref 22–32)
Calcium: 10.5 mg/dL — ABNORMAL HIGH (ref 8.9–10.3)
Chloride: 99 mmol/L (ref 98–111)
Creatinine, Ser: 2.27 mg/dL — ABNORMAL HIGH (ref 0.61–1.24)
GFR calc Af Amer: 31 mL/min — ABNORMAL LOW (ref >60)
GFR calc non Af Amer: 27 mL/min — ABNORMAL LOW (ref >60)
Glucose, Bld: 172 mg/dL — ABNORMAL HIGH (ref 70–99)
Potassium: 4.3 mmol/L (ref 3.5–5.1)
Sodium: 135 mmol/L (ref 135–145)
Total Bilirubin: 1.4 mg/dL — ABNORMAL HIGH (ref 0.3–1.2)
Total Protein: 7 g/dL (ref 6.5–8.1)

## 2017-12-10 LAB — CBC WITH DIFFERENTIAL/PLATELET
Basophils Absolute: 0 10*3/uL (ref 0.0–0.1)
Basophils Relative: 0 %
Eosinophils Absolute: 0 10*3/uL (ref 0.0–0.5)
Eosinophils Relative: 0 %
HCT: 49.6 % (ref 39.0–52.0)
Hemoglobin: 16.2 g/dL (ref 13.0–17.0)
Lymphocytes Relative: 16 %
Lymphs Abs: 0.8 10*3/uL (ref 0.7–4.0)
MCH: 29.2 pg (ref 26.0–34.0)
MCHC: 32.7 g/dL (ref 30.0–36.0)
MCV: 89.4 fL (ref 80.0–100.0)
Monocytes Absolute: 0.9 10*3/uL (ref 0.1–1.0)
Monocytes Relative: 17 %
Neutro Abs: 3.5 10*3/uL (ref 1.7–7.7)
Neutrophils Relative %: 67 %
Platelets: 150 10*3/uL (ref 150–400)
RBC: 5.55 MIL/uL (ref 4.22–5.81)
RDW: 13.5 % (ref 11.5–15.5)
WBC: 5.2 10*3/uL (ref 4.0–10.5)
nRBC: 0 % (ref 0.0–0.2)
nRBC: 0 /100 WBC

## 2017-12-10 LAB — URINALYSIS, ROUTINE W REFLEX MICROSCOPIC
Bilirubin Urine: NEGATIVE
Glucose, UA: NEGATIVE mg/dL
Ketones, ur: NEGATIVE mg/dL
Leukocytes, UA: NEGATIVE
Nitrite: NEGATIVE
Protein, ur: 100 mg/dL — AB
Specific Gravity, Urine: 1.023 (ref 1.005–1.030)
pH: 5 (ref 5.0–8.0)

## 2017-12-10 LAB — PROCALCITONIN: Procalcitonin: 1.31 ng/mL

## 2017-12-10 LAB — AMMONIA: Ammonia: 20 umol/L (ref 9–35)

## 2017-12-10 LAB — I-STAT CG4 LACTIC ACID, ED
Lactic Acid, Venous: 3.33 mmol/L (ref 0.5–1.9)
Lactic Acid, Venous: 4.28 mmol/L (ref 0.5–1.9)
Lactic Acid, Venous: 3.56 mmol/L (ref 0.5–1.9)

## 2017-12-10 LAB — CBG MONITORING, ED: Glucose-Capillary: 174 mg/dL — ABNORMAL HIGH (ref 70–99)

## 2017-12-10 LAB — MRSA PCR SCREENING: MRSA by PCR: NEGATIVE

## 2017-12-10 LAB — I-STAT TROPONIN, ED: Troponin i, poc: 0.07 ng/mL (ref 0.00–0.08)

## 2017-12-10 MED ORDER — SIMVASTATIN 40 MG PO TABS
40.0000 mg | ORAL_TABLET | Freq: Every evening | ORAL | Status: DC
  Administered 2017-12-11 – 2017-12-16 (×6): 40 mg via ORAL
  Filled 2017-12-10 (×7): qty 1

## 2017-12-10 MED ORDER — THIAMINE HCL 100 MG/ML IJ SOLN: 100.0000 mg | Freq: Every day | INTRAMUSCULAR | Status: DC

## 2017-12-10 MED ORDER — NITROGLYCERIN 0.4 MG SL SUBL: 0.4000 mg | SUBLINGUAL_TABLET | SUBLINGUAL | Status: DC | PRN

## 2017-12-10 MED ORDER — VANCOMYCIN HCL IN DEXTROSE 1-5 GM/200ML-% IV SOLN
1000.0000 mg | Freq: Once | INTRAVENOUS | Status: DC
  Filled 2017-12-10: qty 200

## 2017-12-10 MED ORDER — VITAMIN B-1 100 MG PO TABS: 100.0000 mg | ORAL_TABLET | Freq: Every day | ORAL | Status: DC

## 2017-12-10 MED ORDER — ISOSORBIDE MONONITRATE ER 30 MG PO TB24: 30.0000 mg | ORAL_TABLET | Freq: Every day | ORAL | Status: DC

## 2017-12-10 MED ORDER — SODIUM CHLORIDE 0.9 % IV SOLN
2.0000 g | Freq: Once | INTRAVENOUS | Status: AC
  Administered 2017-12-10: 2 g via INTRAVENOUS
  Filled 2017-12-10: qty 2

## 2017-12-10 MED ORDER — LORAZEPAM 2 MG/ML IJ SOLN: 1.0000 mg | Freq: Four times a day (QID) | INTRAMUSCULAR | Status: DC | PRN

## 2017-12-10 MED ORDER — METOPROLOL SUCCINATE ER 25 MG PO TB24
25.0000 mg | ORAL_TABLET | Freq: Every day | ORAL | Status: DC
  Administered 2017-12-11 – 2017-12-17 (×7): 25 mg via ORAL
  Filled 2017-12-10 (×7): qty 1

## 2017-12-10 MED ORDER — FOLIC ACID 1 MG PO TABS: 1.0000 mg | ORAL_TABLET | Freq: Every day | ORAL | Status: DC

## 2017-12-10 MED ORDER — ADULT MULTIVITAMIN W/MINERALS CH
1.0000 | ORAL_TABLET | Freq: Every day | ORAL | Status: DC
  Administered 2017-12-11 – 2017-12-17 (×7): 1 via ORAL
  Filled 2017-12-10 (×7): qty 1

## 2017-12-10 MED ORDER — SODIUM CHLORIDE 0.9 % IV BOLUS
30.0000 mL/kg | Freq: Once | INTRAVENOUS | Status: AC
  Administered 2017-12-10: 3171 mL via INTRAVENOUS

## 2017-12-10 MED ORDER — HYDROMORPHONE HCL 1 MG/ML IJ SOLN: 0.5000 mg | Freq: Once | INTRAMUSCULAR | Status: DC

## 2017-12-10 MED ORDER — HEPARIN SODIUM (PORCINE) 5000 UNIT/ML IJ SOLN
5000.0000 [IU] | Freq: Three times a day (TID) | INTRAMUSCULAR | Status: DC
  Administered 2017-12-10 – 2017-12-17 (×19): 5000 [IU] via SUBCUTANEOUS
  Filled 2017-12-10 (×19): qty 1

## 2017-12-10 MED ORDER — SODIUM CHLORIDE 0.9 % IV SOLN
1.0000 g | Freq: Three times a day (TID) | INTRAVENOUS | Status: DC
  Administered 2017-12-11: 1 g via INTRAVENOUS
  Filled 2017-12-10 (×3): qty 1

## 2017-12-10 MED ORDER — METRONIDAZOLE IN NACL 5-0.79 MG/ML-% IV SOLN
500.0000 mg | Freq: Three times a day (TID) | INTRAVENOUS | Status: DC
  Administered 2017-12-10 – 2017-12-11 (×3): 500 mg via INTRAVENOUS
  Filled 2017-12-10 (×3): qty 100

## 2017-12-10 MED ORDER — VANCOMYCIN HCL 10 G IV SOLR
1250.0000 mg | INTRAVENOUS | Status: DC
  Administered 2017-12-11: 1250 mg via INTRAVENOUS
  Filled 2017-12-10 (×2): qty 1250

## 2017-12-10 MED ORDER — LORAZEPAM 1 MG PO TABS: 1.0000 mg | ORAL_TABLET | Freq: Four times a day (QID) | ORAL | Status: DC | PRN

## 2017-12-10 MED ORDER — VANCOMYCIN HCL 10 G IV SOLR
2000.0000 mg | Freq: Once | INTRAVENOUS | Status: AC
  Administered 2017-12-10: 2000 mg via INTRAVENOUS
  Filled 2017-12-10: qty 2000

## 2017-12-10 MED ORDER — TAMSULOSIN HCL 0.4 MG PO CAPS
0.4000 mg | ORAL_CAPSULE | Freq: Every evening | ORAL | Status: DC
  Administered 2017-12-11 – 2017-12-16 (×6): 0.4 mg via ORAL
  Filled 2017-12-10 (×6): qty 1

## 2017-12-10 MED ORDER — PANTOPRAZOLE SODIUM 40 MG PO TBEC
40.0000 mg | DELAYED_RELEASE_TABLET | Freq: Every day | ORAL | Status: DC
  Administered 2017-12-11 – 2017-12-17 (×7): 40 mg via ORAL
  Filled 2017-12-10 (×7): qty 1

## 2017-12-10 MED ORDER — SODIUM CHLORIDE 0.9 % IV SOLN
INTRAVENOUS | Status: DC
  Administered 2017-12-10: 21:00:00 via INTRAVENOUS
  Administered 2017-12-11: 150 mL/h via INTRAVENOUS
  Administered 2017-12-12 – 2017-12-13 (×3): via INTRAVENOUS

## 2017-12-10 MED ORDER — SODIUM CHLORIDE 0.9 % IV SOLN: 1000.0000 mL | INTRAVENOUS | Status: DC

## 2017-12-10 MED ORDER — LORAZEPAM 2 MG/ML IJ SOLN: 0.0000 mg | Freq: Two times a day (BID) | INTRAMUSCULAR | Status: DC

## 2017-12-10 MED ORDER — LORAZEPAM 2 MG/ML IJ SOLN: 0.0000 mg | Freq: Four times a day (QID) | INTRAMUSCULAR | Status: DC

## 2017-12-10 MED ORDER — ADULT MULTIVITAMIN W/MINERALS CH: 1.0000 | ORAL_TABLET | Freq: Every day | ORAL | Status: DC

## 2017-12-10 NOTE — H&P (Signed)
History and Physical    Tyrone Roman:[id number] DOB: 1946/01/27 DOA: 12/10/2017  PCP: [name], MD Consultants:  Home Patient coming from: Lives alone, has a friend that checks in on him  Chief Complaint: AMS  HPI: Tyrone Roman is a 72 y.o. male with medical history significant for RA not currently on meds, lumbar microdiscetcomy 12/06/17 by Dr. Kathyrn Sheriff, who presented to the ED today with c/o progressive back pain, AMS, increased weakness (reportedly of his LLE but poor historian). No fevers. Called after hours nurse Sat due to increased pain, was told to increase oxycodone to q2h from q4h. He has a friend who stays with him who states he became more lethargic after this, and stopped eating and drinking almost entirely since Sat. No oxy has been given since Sat afternoon. Friend reports that pt does not drink alcohol hardly ever; no h/o illicit drugs or smoking. Pt reports his pain is currently 5/10 but has gotten as bad as 10/10. No fevers but pt has had increased sweating.  ED Course: Pt was intermittently altered, at times appropriate and oriented, other times appeared confused, agitated, even paranoid. Pt was hypertensive but on exam was dry with delayed cap refill and skin mottling. Seen by NSGY who felt surgical site looked good. Started on broad spec abx, IVF. Had several episodes of urinary incontinence.  Review of Systems: As per HPI; otherwise review of systems reviewed and negative except for constipation for at least a week. Has not had BM since before surgery.   Ambulatory Status: Unable to ambulate since surgery 12/06/17  Past Medical History:  Diagnosis Date  . Barrett's esophageal ulceration   . BPH (benign prostatic hyperplasia)   . CAD (coronary artery disease)    sees Dr. Mar Daring  . Cataract    both eyes  . Colonic polyp   . Concussion with loss of consciousness of 30 minutes or less   . Contact dermatitis   . Contact with or exposure to venereal  diseases   . COPD (chronic obstructive pulmonary disease) ([place])    sees Dr. Kara Mead   . Diverticulosis of colon   . Dysphagia   . GERD (gastroesophageal reflux disease)   . HNP (herniated nucleus pulposus), lumbar    recurrent  . Hyperlipidemia   . Hypertension   . Laceration of scalp   . Myocardial infarction [facility])    2011- stent placed  . Nephrolithiasis   . Pre-operative cardiovascular examination   . Rheumatoid arthritis [facility])    sees Dr. Gavin Pound   . SOB (shortness of breath)     Past Surgical History:  Procedure Laterality Date  . CARDIAC CATHETERIZATION N/A 01/19/2016   Procedure: Left Heart Cath and Coronary Angiography;  Surgeon: Burnell Blanks, MD;  Location: [facility];  Service: Cardiovascular;  Laterality: N/A;  . COLONOSCOPY  12/09/2014   per Dr. Silverio Decamp, adenomatous polyps, repeat 3 years   . EGD with dilatation  02/17/09   Barretts esophagus   . heart stent    . Left knee surgery    . left knee surgery    . LUMBAR LAMINECTOMY/DECOMPRESSION MICRODISCECTOMY Right 10/07/2017   Procedure: RIGHT LUMBAR ONE-TWO LAMINECTOMY WITH MICRODISCECTOMY;  Surgeon: Consuella Lose, MD;  Location: [facility];  Service: Neurosurgery;  Laterality: Right;  . LUMBAR LAMINECTOMY/DECOMPRESSION MICRODISCECTOMY N/A 12/06/2017   Procedure: RECURRENT MICRODISCECTOMY LUMBAR ONE - LUMBAR TWO;  Surgeon: Consuella Lose, MD;  Location: [facility];  Service: Neurosurgery;  Laterality: N/A;  .  TONSILLECTOMY      Social History   Socioeconomic History  . Marital status: Divorced    Spouse name: Not on file  . Number of children: 2  . Years of education: Not on file  . Highest education level: Not on file  Occupational History  . Occupation: ENGINEER    Employer: [employer]  Social Needs  . Financial resource strain: Not on file  . Food insecurity:    Worry: Not on file    Inability: Not on file  . Transportation needs:    Medical: Not on file    Non-medical:  Not on file  Tobacco Use  . Smoking status: Former Smoker    Packs/day: 1.00    Years: 20.00    Pack years: 20.00    Types: Cigarettes    Last attempt to quit: 10/12/1988    Years since quitting: 29.1  . Smokeless tobacco: Never Used  . Tobacco comment: 1960's   Substance and Sexual Activity  . Alcohol use: Yes    Alcohol/week: 0.0 standard drinks    Comment: rare  . Drug use: No  . Sexual activity: Not on file  Lifestyle  . Physical activity:    Days per week: Not on file    Minutes per session: Not on file  . Stress: Not on file  Relationships  . Social connections:    Talks on phone: Not on file    Gets together: Not on file    Attends religious service: Not on file    Active member of club or organization: Not on file    Attends meetings of clubs or organizations: Not on file    Relationship status: Not on file  . Intimate partner violence:    Fear of current or ex partner: Not on file    Emotionally abused: Not on file    Physically abused: Not on file    Forced sexual activity: Not on file  Other Topics Concern  . 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     Allergies  Allergen Reactions  . Cephalexin Shortness Of Breath, Rash and Other (See Comments)    PATIENT HAS HAD A PCN REACTION WITH IMMEDIATE RASH, FACIAL/TONGUE/THROAT SWELLING, SOB, OR LIGHTHEADEDNESS WITH HYPOTENSION:  #  #  YES  #  #  Has patient had a PCN reaction causing severe rash involving mucus membranes or skin necrosis: No Has patient had a PCN reaction that required hospitalization: No Has patient had a PCN reaction occurring within the last 10 years: No If all of the above answers are "NO", then may proceed with Cephalosporin use.   . Clarithromycin Shortness Of Breath and Rash  . Certolizumab Pegol Hives  . Doxycycline Rash  . Hydroxyzine Rash  . Lidoderm Other (See Comments)    "made me act weird"    . Pyrithione Zinc Rash    Family History  Problem Relation Age of Onset  . Heart attack Father        cardiovascular disorder  . Arthritis Father        family hx  . Colon cancer Father        mets  . Prostate cancer Father        1st degree relative  . Arthritis Mother   . Diabetes Paternal Uncle   . Colon cancer Paternal Uncle   . Cancer Maternal Grandmother   . Skin  cancer Daughter   . Esophageal cancer Neg Hx   . Stomach cancer Neg Hx   . Rectal cancer Neg Hx     Prior to Admission medications   Medication Sig Start Date End Date Taking? Authorizing Provider  aspirin EC 81 MG tablet Take 1 tablet (81 mg total) by mouth daily. 12/11/17   Costella, Vista Mink, PA-C  cetirizine (ZYRTEC) 10 MG tablet Take 10 mg by mouth daily.    [provider]  isosorbide mononitrate (IMDUR) 30 MG 24 hr tablet Take 1 tablet (30 mg total) by mouth daily. 01/26/16 12/05/17  Eileen Stanford, PA-C  leflunomide (ARAVA) 20 MG tablet Take 1 tablet (20 mg total) by mouth 2 (two) times daily. Patient taking differently: Take 20 mg by mouth every other day.  12/05/16   [name], MD  metoprolol succinate (TOPROL-XL) 25 MG 24 hr tablet TAKE 1 TABLET BY MOUTH DAILY Patient taking differently: Take 25 mg by mouth daily.  11/04/17   [name], MD  Multiple Vitamin (MULTIVITAMIN WITH MINERALS) TABS Take 1 tablet by mouth daily.    [provider]  naproxen (NAPROSYN) 500 MG tablet Take 500 mg by mouth 2 (two) times daily. 05/02/16   [provider]  nitroGLYCERIN (NITROSTAT) 0.4 MG SL tablet Place 1 tablet (0.4 mg total) under the tongue every 5 (five) minutes as needed for chest pain (3 doses max). 12/27/14   Josue Hector, MD  omeprazole (PRILOSEC) 40 MG capsule Take 40 mg by mouth daily.    [provider]  predniSONE (DELTASONE) 5 MG tablet Take 5 mg by mouth daily as needed (arthritis).  09/21/17   [provider]  sildenafil (REVATIO) 20 MG  tablet Take 20 mg by mouth daily as needed Patient taking differently: Take 20 mg by mouth daily as needed (erectile disfunction).  02/22/17   [name], MD  simvastatin (ZOCOR) 40 MG tablet Take 1 tablet (40 mg total) by mouth daily. Patient taking differently: Take 40 mg by mouth every evening.  12/05/16   [name], MD  tamsulosin (FLOMAX) 0.4 MG CAPS capsule TAKE ONE CAPSULE BY MOUTH DAILY Patient taking differently: Take 0.4 mg by mouth daily.  05/07/17   [name], MD  zolpidem (AMBIEN) 10 MG tablet TAKE ONE TABLET BY MOUTH EVERY NIGHT AT BEDTIME Patient taking differently: Take 10 mg by mouth at bedtime.  11/08/17   [name], MD    Physical Exam: Vitals:   12/10/17 1500 12/10/17 1519 12/10/17 1545 12/10/17 1600  BP: (!) 142/103 (!) 160/100 (!) 143/108 (!) 156/102  Pulse:   (!) 115   Resp: 15  15 13   Temp:      TempSrc:      SpO2:   96%   Weight:      Height:         . General: Appears stated age, lying in bed,  Appears calm and comfortable and is in NAD . Eyes:  PERRL, EOMI, normal lids, iris . ENT:  grossly normal hearing, dry/tachy mmm; appropriate dentition . Neck:  no LAD, masses or thyromegaly; no carotid bruits . Cardiovascular:  Tachy, +occasional ectopy, no murmur. Marland Kitchen Respiratory:   CTA bilaterally with no wheezes/rales/rhonchi.  Normal respiratory effort. . Abdomen:  soft, NT, ND, hypoactive BS . Back:   grossly normal alignment, no CVAT . Skin:  no rash or induration seen on limited exam . Musculoskeletal:  grossly normal tone BUE/BLE, good ROM,  no bony abnormality or obvious joint deformity . Lower extremity:  No LE edema.  Limited foot exam with no ulcerations.  2+ distal pulses. Marland Kitchen Psychiatric:  grossly normal mood and affect, speech fluent and appropriate, AOx3 at times, other times disoriented and confused, at times paranoid/agitated . Neurologic:  CN 2-12 grossly intact, moves all extremities, could not cooperate with motor or sensory exam.    Radiological Exams on Admission: Dg Chest Port 1 View  Result Date: 12/10/2017 CLINICAL DATA:  Altered mental status. Status post lumbar laminectomy and decompression 12/06/2017. EXAM: PORTABLE CHEST 1 VIEW COMPARISON:  PA and lateral chest 09/07/2017.  CT chest 11/27/2010. FINDINGS: The lungs are emphysematous but clear. Heart size is normal. No pneumothorax or pleural effusion. No acute or focal bony abnormality. IMPRESSION: Emphysema without acute disease. Electronically Signed   By: Inge Rise M.D.   On: 12/10/2017 15:30    EKG: Independently reviewed.  NSR with rate 118 Sinus tachycardia Multiform ventricular premature complexes Probable left atrial enlargement Left anterior fascicular block Abnormal R-wave progression, early transition Left ventricular hypertrophy Nonspecific T abnrm, anterolateral leads, inferior leads   Labs on Admission: I have personally reviewed the available labs and imaging studies at the time of the admission.  Pertinent labs:  Lactic acid 3.33 --> 4.28 -->3.56  Sodium 135 potassium 4.3 chloride 99 CO2 24 glucose 172 BUN 48 creatinine 2.27 (baseline 1.0) calcium 10.5 albumin 2.9 AST 58 ALT 32 alkaline phosphatase 48 total protein 7.0 total bilirubin 1.4 TroponinI 0 0.07 WBC 5.2 hemoglobin 16.2 platelets 150 Urinalysis: pH 5.0, specific gravity 1.023, protein 100, negative hemoglobin, negative ketones, negative leukocytes, negative nitrite.  6-10 RBC, granular casts present Ammonia 20 Procal 1.31   Assessment/Plan Principal Problem:   Sepsis (HCC) Active Problems:   Hyperlipidemia   Coronary artery disease of native artery of native heart with stable angina pectoris (HCC)   GERD   COPD (chronic obstructive pulmonary disease) (HCC)   Rheumatoid arthritis (HCC)   HNP (herniated nucleus pulposus), lumbar   Altered mental state   AKI (acute kidney injury) ([place])   Constipation due to pain medication   Sepsis with AMS: Given his AKI and  uremia as well as his increased dosing of oxycodone over the weekend, I suspect his AMS is due to a combination of uremia and AKI and decreased clearance of opioids. Pt and his friend confirm he is a rare drinker, no other drugs. Given his recent microdiscectomy, I am somewhat concerned for CNS infection, especially given the sepsis picture he is presenting with, his increased lumbar pain and new LLE weakness. However as pt is a poor historian due to AMS it is difficult to say what symptoms he is really having (they change frequently at different times of interview). U/A shows no s/s UTI. CXR no evidence of PNA. Surgical site looks good. Lactic acidemia could certainly just be due to poor perfusion from hypovolemia from decreased po intake but his procal is also 1.31. I maintain a high index of suspicion for infection. -sepsis protocol -cont broad spec abx with vanc, aztreonam, flagyl -f/u BCX x 2 -trend lactate, procal -daily CBC -avoid sedating meds if at all possible -would discuss with NSGY again tomorrow if no improvement -cont IVF for AKI, daily BMP -fall precautions -ammonia level WNL -no h/o heavy EtOH use or illlicit drugs -hold ambien  AKI, likely prerenal given h/o decreased po intake for several days, increased BUN, granular casts in U/A. -aggressive IVF resuscitation -daily BMP -avoid nephrotoxins -  dose all meds for GFR  HLD -cont zocor  CAD -cont imdur, metoprolol, statin -unclear why pt not on ASA; likely held for recent surgery, will need to determine when to restart  COPD -no evidence of exacerbation, sats OK, CXR OK -monitor  BPH -cont flomax  Constipation, likely opioid-induced -attempt to minimize opiates if possible -bowel regimen  DVT prophylaxis: Heparin [name]  Code Status:  Full - confirmed with patient/family Family Communication: friend via phone  Disposition Plan:  Home once clinically improved Consults called: NSGY  Admission status: Admit - It is  my clinical opinion that admission to INPATIENT is reasonable and necessary because of the expectation that this patient will require hospital care that crosses at least 2 midnights to treat this condition based on the medical complexity of the problems presented.  Given the aforementioned information, the predictability of an adverse outcome is felt to be significant.     Janora Norlander MD [facility]  If note is complete, please contact covering daytime or nighttime physician. [email or web address] Password [email or web address]  12/10/2017, 4:55 PM

## 2017-12-10 NOTE — ED Notes (Signed)
Attempt to start IV and draw blood cultures. Unable to get blood return, but IV obtained.

## 2017-12-10 NOTE — ED Notes (Signed)
Dr. Silverio Lay in to assess pt for admission

## 2017-12-10 NOTE — ED Notes (Signed)
Dr. Danelle Earthly in to assess pt

## 2017-12-10 NOTE — Progress Notes (Addendum)
Pharmacy Antibiotic Note  Tyrone Roman is a 72 y.o. male admitted on 12/10/2017 with suspected sepsis.  Pharmacy has been consulted for aztreonam and vancomycin dosing. First aztreonam dose per MD.   Currently, patient is afebrile and WBC 5.2. Scr is elevated at 2.27 mg/dL (BL ~1 mg/dL).   Plan: Aztreonam 1 gm IV q8h Vancomycin 2 gm IV once, then 1250 mg IV q24h Monitor renal function F/u cultures, length of therapy, and vancomycin trough at steady state   Height: 6\' 2"  (188 cm) Weight: 233 lb (105.7 kg) IBW/kg (Calculated) : 82.2  Temp (24hrs), Avg:97.9 F (36.6 C), Min:97.9 F (36.6 C), Max:97.9 F (36.6 C)  Recent Labs  Lab 12/06/17 1237 12/10/17 1425  WBC 4.6  --   CREATININE 1.05  --   LATICACIDVEN  --  3.33*    Estimated Creatinine Clearance: 82.4 mL/min (by C-G formula based on SCr of 1.05 mg/dL).    Allergies  Allergen Reactions  . Cephalexin Shortness Of Breath, Rash and Other (See Comments)    PATIENT HAS HAD A PCN REACTION WITH IMMEDIATE RASH, FACIAL/TONGUE/THROAT SWELLING, SOB, OR LIGHTHEADEDNESS WITH HYPOTENSION:  #  #  YES  #  #  Has patient had a PCN reaction causing severe rash involving mucus membranes or skin necrosis: No Has patient had a PCN reaction that required hospitalization: No Has patient had a PCN reaction occurring within the last 10 years: No If all of the above answers are "NO", then may proceed with Cephalosporin use.   . Clarithromycin Shortness Of Breath and Rash  . Certolizumab Pegol Hives  . Doxycycline Rash  . Hydroxyzine Rash  . Lidoderm Other (See Comments)    "made me act weird"  . Pyrithione Zinc Rash    Antimicrobials this admission: Aztreonam 11/5>> Vancomycin 11/5>>   Microbiology results: 11/5 BCx:   Thank you for allowing pharmacy to be a part of this patient's care.  Willia Craze, Pharmacy Student

## 2017-12-10 NOTE — ED Notes (Signed)
Attempt to place foley catheter. Unable to pass catheter or inflate balloon. Urine sample obtained.

## 2017-12-10 NOTE — ED Notes (Signed)
First set of blood cultures were obtained at 14:55

## 2017-12-10 NOTE — ED Provider Notes (Signed)
[facility] Provider Note   CSN: [id number] Arrival date & time: 12/10/17  1356     History   Chief Complaint Chief Complaint  Patient presents with  . Post-op Problem    HPI Tyrone Roman is a 72 y.o. male.  The history is provided by the patient, a friend and medical records. No language interpreter was used.   Tyrone Roman is a 72 y.o. male who presents to the Emergency Department complaining of postop problem. Since the emergency department for back pain and weakness of following microdiscetcomy on November 1. He was doing well at the time of hospital discharge. The day following discharge he experienced increased pain in his back and called the office and had his pain medications increased. Over the last 24 to 48 hours he is experienced increased lethargy with urinary incontinence as well as progressive back pain. No reports of fever. He does report abdominal discomfort with vomiting. He states that he is not taking any medications currently. He was previously on [place] and prednisone but stopped them about a month ago. His friend reports that he has been more confused over the last 24 hours. Past Medical History:  Diagnosis Date  . Barrett's esophageal ulceration   . BPH (benign prostatic hyperplasia)   . CAD (coronary artery disease)    sees Dr. Mar Daring  . Cataract    both eyes  . Colonic polyp   . Concussion with loss of consciousness of 30 minutes or less   . Contact dermatitis   . Contact with or exposure to venereal diseases   . COPD (chronic obstructive pulmonary disease) ([place])    sees Dr. Kara Mead   . Diverticulosis of colon   . Dysphagia   . GERD (gastroesophageal reflux disease)   . HNP (herniated nucleus pulposus), lumbar    recurrent  . Hyperlipidemia   . Hypertension   . Laceration of scalp   . Myocardial infarction [facility])    2011- stent placed  . Nephrolithiasis   . Pre-operative cardiovascular examination     . Rheumatoid arthritis [facility])    sees Dr. Gavin Pound   . SOB (shortness of breath)     Patient Active Problem List   Diagnosis Date Noted  . Altered mental state 12/10/2017  . AKI (acute kidney injury) ([place]) 12/10/2017  . Sepsis ([place]) 12/10/2017  . Constipation due to pain medication 12/10/2017  . HNP (herniated nucleus pulposus), lumbar 10/07/2017  . Lumbar herniated disc 10/07/2017  . Psoriasis of scalp 03/27/2016  . Rheumatoid arthritis ([place]) 03/25/2013  . GI bleeding 02/20/2012  . Dyspnea on exertion 09/08/2010  . COPD (chronic obstructive pulmonary disease) ([place]) 08/04/2010  . Bruising 08/04/2010  . SYNCOPE 02/01/2010  . HEADACHE 01/04/2010  . TINNITUS 12/16/2009  . Dizziness and giddiness 12/16/2009  . NECK SPRAIN AND STRAIN 10/21/2009  . LUMBAR SPRAIN AND STRAIN 10/21/2009  . Hyperlipidemia 06/10/2009  . Coronary artery disease of native artery of native heart with stable angina pectoris ([place]) 06/10/2009  . GERD 06/10/2009  . BARRETTS ESOPHAGUS 06/10/2009  . SHORTNESS OF BREATH 05/31/2009  . CONCUSSION WITH LOC OF 30 MINUTES OR LESS 02/28/2009  . LACERATION, SCALP 02/28/2009  . NEPHROLITHIASIS 07/01/2007  . CONTACT DERMATITIS 12/12/2006  . BPH with urinary obstruction 11/04/2006  . COLONIC POLYPS 10/21/2003  . DIVERTICULOSIS, COLON 10/21/2003    Past Surgical History:  Procedure Laterality Date  . CARDIAC CATHETERIZATION N/A 01/19/2016   Procedure: Left Heart Cath and  Coronary Angiography;  Surgeon: Burnell Blanks, MD;  Location: [place];  Service: Cardiovascular;  Laterality: N/A;  . COLONOSCOPY  12/09/2014   per Dr. Silverio Decamp, adenomatous polyps, repeat 3 years   . EGD with dilatation  02/17/09   Barretts esophagus   . heart stent    . Left knee surgery    . left knee surgery    . LUMBAR LAMINECTOMY/DECOMPRESSION MICRODISCECTOMY Right 10/07/2017   Procedure: RIGHT LUMBAR ONE-TWO LAMINECTOMY WITH MICRODISCECTOMY;  Surgeon: Consuella Lose, MD;  Location: [place];  Service: Neurosurgery;  Laterality: Right;  . LUMBAR LAMINECTOMY/DECOMPRESSION MICRODISCECTOMY N/A 12/06/2017   Procedure: RECURRENT MICRODISCECTOMY LUMBAR ONE - LUMBAR TWO;  Surgeon: Consuella Lose, MD;  Location: [facility];  Service: Neurosurgery;  Laterality: N/A;  . TONSILLECTOMY          Home Medications    Prior to Admission medications   Medication Sig Start Date End Date Taking? Authorizing Provider  methocarbamol (ROBAXIN) 500 MG tablet Take 500 mg by mouth every 6 (six) hours as needed for muscle spasms.   Yes [provider]  metoprolol succinate (TOPROL-XL) 25 MG 24 hr tablet TAKE 1 TABLET BY MOUTH DAILY Patient taking differently: Take 25 mg by mouth daily.  11/04/17  Yes [name], MD  nitroGLYCERIN (NITROSTAT) 0.4 MG SL tablet Place 1 tablet (0.4 mg total) under the tongue every 5 (five) minutes as needed for chest pain (3 doses max). 12/27/14  Yes Josue Hector, MD  omeprazole (PRILOSEC) 40 MG capsule Take 40 mg by mouth daily.   Yes [provider]  oxyCODONE-acetaminophen (PERCOCET) 7.5-325 MG tablet Take 1 tablet by mouth every 2 (two) hours as needed (for pain).   Yes [provider]  simvastatin (ZOCOR) 40 MG tablet Take 1 tablet (40 mg total) by mouth daily. Patient taking differently: Take 40 mg by mouth every evening.  12/05/16  Yes [name], MD  zolpidem (AMBIEN) 10 MG tablet TAKE ONE TABLET BY MOUTH EVERY NIGHT AT BEDTIME Patient taking differently: Take 10 mg by mouth at bedtime.  11/08/17  Yes [name], MD  aspirin EC 81 MG tablet Take 1 tablet (81 mg total) by mouth daily. 12/11/17   Costella, Vista Mink, PA-C  cetirizine (ZYRTEC) 10 MG tablet Take 10 mg by mouth daily.    [provider]  isosorbide mononitrate (IMDUR) 30 MG 24 hr tablet Take 1 tablet (30 mg total) by mouth daily. 01/26/16 12/10/17  Eileen [place], PA-C  leflunomide (ARAVA) 20 MG tablet Take 1 tablet (20 mg  total) by mouth 2 (two) times daily. Patient taking differently: Take 20 mg by mouth daily.  12/05/16   [name], MD  Multiple Vitamin (MULTIVITAMIN WITH MINERALS) TABS Take 1 tablet by mouth daily.    [provider]  naproxen (NAPROSYN) 500 MG tablet Take 500 mg by mouth 2 (two) times daily. 05/02/16   [provider]  predniSONE (DELTASONE) 5 MG tablet Take 5 mg by mouth daily as needed (arthritis).  09/21/17   [provider]  sildenafil (REVATIO) 20 MG tablet Take 20 mg by mouth daily as needed Patient taking differently: Take 20 mg by mouth daily as needed (erectile disfunction).  02/22/17   [name], MD  tamsulosin (FLOMAX) 0.4 MG CAPS capsule TAKE ONE CAPSULE BY MOUTH DAILY Patient taking differently: Take 0.4 mg by mouth daily.  05/07/17   [name], MD    Family History Family  History  Problem Relation Age of Onset  . Heart attack Father        cardiovascular disorder  . Arthritis Father        family hx  . Colon cancer Father        mets  . Prostate cancer Father        1st degree relative  . Arthritis Mother   . Diabetes Paternal Uncle   . Colon cancer Paternal Uncle   . Cancer Maternal Grandmother   . Skin cancer Daughter   . Esophageal cancer Neg Hx   . Stomach cancer Neg Hx   . Rectal cancer Neg Hx     Social History Social History   Tobacco Use  . Smoking status: Former Smoker    Packs/day: 1.00    Years: 20.00    Pack years: 20.00    Types: Cigarettes    Last attempt to quit: 10/12/1988    Years since quitting: 29.1  . Smokeless tobacco: Never Used  . Tobacco comment: 1960's   Substance Use Topics  . Alcohol use: Yes    Alcohol/week: 0.0 standard drinks    Comment: rare  . Drug use: No     Allergies   Cephalexin; Clarithromycin; Certolizumab pegol; Doxycycline; Hydroxyzine; Lidoderm; and Pyrithione zinc   Review of Systems Review of Systems  All other systems reviewed and are negative.    Physical  Exam Updated Vital Signs BP (!) 144/103   Pulse (!) 116   Temp 98.4 F (36.9 C) (Oral)   Resp 18   Ht 6\' 2"  (1.88 m)   Wt 105.7 kg   SpO2 96%   BMI 29.92 kg/m   Physical Exam  Constitutional: He appears well-developed and well-nourished. He appears distressed.  Ill appearing  HENT:  Head: Normocephalic and atraumatic.  Cardiovascular: Regular rhythm.  No murmur heard. Tachycardic  Pulmonary/Chest: Effort normal and breath sounds normal. No respiratory distress.  Abdominal: Soft. There is no rebound and no guarding.  Mild generalized abdominal tenderness increased over the epigastric.  Musculoskeletal: He exhibits no edema or tenderness.  Midline spine incisional site is clean, dry, intact.  Neurological: He is alert.  Confused. Disoriented to recent events. 4/5 strength and bilateral upper extremities 3 to 4/5 strength and bilateral lower extremities. Absent left patellar reflexes, one plus write patellar reflexes. Sensation intact and bilateral lower extremities. Decreased rectal tone.  Skin: Skin is warm and dry.  Cool skin with modeling of abdomen, arms and legs. Decreased cap refill.  Psychiatric: He has a normal mood and affect. His behavior is normal.  Nursing note and vitals reviewed.    ED Treatments / Results  Labs (all labs ordered are listed, but only abnormal results are displayed) Labs Reviewed  COMPREHENSIVE METABOLIC PANEL - Abnormal; Notable for the following components:      Result Value   Glucose, Bld 172 (*)    BUN 48 (*)    Creatinine, Ser 2.27 (*)    Calcium 10.5 (*)    Albumin 2.9 (*)    AST 58 (*)    Total Bilirubin 1.4 (*)    GFR calc non Af Amer 27 (*)    GFR calc Af Amer 31 (*)    All other components within normal limits  URINALYSIS, ROUTINE W REFLEX MICROSCOPIC - Abnormal; Notable for the following components:   APPearance HAZY (*)    Hgb urine dipstick MODERATE (*)    Protein, ur 100 (*)    Bacteria, UA RARE (*)  All other  components within normal limits  CBG MONITORING, ED - Abnormal; Notable for the following components:   Glucose-Capillary 174 (*)    All other components within normal limits  I-STAT CG4 LACTIC ACID, ED - Abnormal; Notable for the following components:   Lactic Acid, Venous 3.33 (*)    All other components within normal limits  I-STAT CG4 LACTIC ACID, ED - Abnormal; Notable for the following components:   Lactic Acid, Venous 4.28 (*)    All other components within normal limits  I-STAT CG4 LACTIC ACID, ED - Abnormal; Notable for the following components:   Lactic Acid, Venous 3.56 (*)    All other components within normal limits  MRSA PCR SCREENING  CULTURE, BLOOD (ROUTINE X 2)  CULTURE, BLOOD (ROUTINE X 2)  CBC WITH DIFFERENTIAL/PLATELET  PROCALCITONIN  AMMONIA  CBC  BASIC METABOLIC PANEL  I-STAT TROPONIN, ED  I-STAT CG4 LACTIC ACID, ED  I-STAT CG4 LACTIC ACID, ED  I-STAT CG4 LACTIC ACID, ED    EKG EKG Interpretation  Date/Time:  Tuesday December 10 2017 13:59:19 EST Ventricular Rate:  118 PR Interval:    QRS Duration: 97 QT Interval:  299 QTC Calculation: 419 R Axis:   -65 Text Interpretation:  Sinus tachycardia Multiform ventricular premature complexes Probable left atrial enlargement Left anterior fascicular block Abnormal R-wave progression, early transition Left ventricular hypertrophy Nonspecific T abnrm, anterolateral leads ST elevation, consider inferior injury Confirmed by Fredia Sorrow [phone]) on 12/10/2017 2:44:50 PM   Radiology Dg Chest Port 1 View  Result Date: 12/10/2017 CLINICAL DATA:  Altered mental status. Status post lumbar laminectomy and decompression 12/06/2017. EXAM: PORTABLE CHEST 1 VIEW COMPARISON:  PA and lateral chest 09/07/2017.  CT chest 11/27/2010. FINDINGS: The lungs are emphysematous but clear. Heart size is normal. No pneumothorax or pleural effusion. No acute or focal bony abnormality. IMPRESSION: Emphysema without acute disease.  Electronically Signed   By: Inge Rise M.D.   On: 12/10/2017 15:30    Procedures Procedures (including critical care time) CRITICAL CARE Performed by: Quintella Reichert   Total critical care time: 40 minutes  Critical care time was exclusive of separately billable procedures and treating other patients.  Critical care was necessary to treat or prevent imminent or life-threatening deterioration.  Critical care was time spent personally by me on the following activities: development of treatment plan with patient and/or surrogate as well as nursing, discussions with consultants, evaluation of patient's response to treatment, examination of patient, obtaining history from patient or surrogate, ordering and performing treatments and interventions, ordering and review of laboratory studies, ordering and review of radiographic studies, pulse oximetry and re-evaluation of patient's condition.  Medications Ordered in ED Medications  metroNIDAZOLE (FLAGYL) IVPB 500 mg (500 mg Intravenous New Bag/Given 12/10/17 2302)  aztreonam (AZACTAM) 1 g in sodium chloride 0.9 % 100 mL IVPB (1 g Intravenous New Bag/Given 12/11/17 0021)  vancomycin (VANCOCIN) 1,250 mg in sodium chloride 0.9 % 250 mL IVPB (has no administration in time range)  metoprolol succinate (TOPROL-XL) 24 hr tablet 25 mg (has no administration in time range)  nitroGLYCERIN (NITROSTAT) SL tablet 0.4 mg (has no administration in time range)  simvastatin (ZOCOR) tablet 40 mg (40 mg Oral Given 12/11/17 0019)  pantoprazole (PROTONIX) EC tablet 40 mg (has no administration in time range)  tamsulosin (FLOMAX) capsule 0.4 mg (0.4 mg Oral Given 12/11/17 0019)  heparin injection 5,000 Units (5,000 Units Subcutaneous Given 12/10/17 2304)  0.9 %  sodium chloride infusion ( Intravenous New Bag/Given  12/10/17 2125)  multivitamin with minerals tablet 1 tablet (has no administration in time range)  aztreonam (AZACTAM) 2 g in sodium chloride 0.9 % 100 mL  IVPB (0 g Intravenous Stopped 12/10/17 1558)  vancomycin (VANCOCIN) 2,000 mg in sodium chloride 0.9 % 500 mL IVPB (2,000 mg Intravenous New Bag/Given 12/10/17 1632)  sodium chloride 0.9 % bolus 3,171 mL (3,171 mLs Intravenous Bolus 12/10/17 1514)     Initial Impression / Assessment and Plan / ED Course  I have reviewed the triage vital signs and the nursing notes.  Pertinent labs & imaging results that were available during my care of the patient were reviewed by me and considered in my medical decision making (see chart for details).     Patient with history of rheumatoid arthritis, hypertension here for evaluation of back pain and change in mental status following micro discectomy on November 1. He is ill appearing on evaluation with altered mental status and poor perfusion despite his hypertension. He was treated with IV fluid resuscitation as well as IV antibiotics. Neurosurgery was consulted. Neurosurgery evaluated the patient in the emergency department. Labs demonstrate elevation and lactate as well as acute kidney injury. There is no significant urinary retention on bladder scan. Medicine consulted for admission. Patient and family updated findings of studies and recommendation for admission.  Final Clinical Impressions(s) / ED Diagnoses   Final diagnoses:  None    ED Discharge Orders    None       Quintella Reichert, MD 12/11/17 [phone]

## 2017-12-10 NOTE — ED Notes (Signed)
Pt becoming more confused with hallucinations.  Wants to know why his friend won't come in the room.  St's he heard his daughter saying she drove a long way to see him.  Assured pt that his daughter was not here.  Pt disoriented to date and yr.  Dr. Steffanie Dunn made aware.

## 2017-12-10 NOTE — ED Notes (Signed)
2nd set of cultures obtained prior to starting antibiotics.

## 2017-12-10 NOTE — Consult Note (Signed)
Chief Complaint   Chief Complaint  Patient presents with  . Post-op Problem    HPI   Consult requested by: Dr Ralene Bathe Reason for consult: post op problem, altered mental status  HPI: Tyrone Roman is a 72 y.o. male who was brought to the ER due to altered mental status. Present with friend. He is s/p repeat right laminotomy and microdiscectomy due to recurrent L1-2 lumbar HNP and incomplete cauda equina syndrome on 12/06/2017 by Dr Kathyrn Sheriff. Pt was discharged same day at his request due to complete resolution in pain. He was doing well post operatively until Sunday when he had increase in pain. His friend called the answering service after hours & was told to increase his oxycodone from 1 tab q 4 to 1 tab q 2. This helped with his pain but caused him to be extremely lethargic per friend. He eventually got to the point today where he has been unable to get out of bed, was urinating on self and had repetitive questioning so friend called EMS.  Currently patient complains of back soreness and hip pain. Pain not as severe as pre op.  Denies LE symptoms including N/T. Denies exertional/positional headaches. He reports having to urinate while I was in the room. Gave him urinal and he was able to urinate. He denies fecal incontinence.   Patient Active Problem List   Diagnosis Date Noted  . HNP (herniated nucleus pulposus), lumbar 10/07/2017  . Lumbar herniated disc 10/07/2017  . Psoriasis of scalp 03/27/2016  . Rheumatoid arthritis ([place]) 03/25/2013  . GI bleeding 02/20/2012  . Dyspnea on exertion 09/08/2010  . COPD (chronic obstructive pulmonary disease) ([place]) 08/04/2010  . Bruising 08/04/2010  . SYNCOPE 02/01/2010  . HEADACHE 01/04/2010  . TINNITUS 12/16/2009  . Dizziness and giddiness 12/16/2009  . NECK SPRAIN AND STRAIN 10/21/2009  . LUMBAR SPRAIN AND STRAIN 10/21/2009  . Hyperlipidemia 06/10/2009  . Coronary artery disease of native artery of native heart with stable angina pectoris  ([place]) 06/10/2009  . GERD 06/10/2009  . BARRETTS ESOPHAGUS 06/10/2009  . SHORTNESS OF BREATH 05/31/2009  . CONCUSSION WITH LOC OF 30 MINUTES OR LESS 02/28/2009  . LACERATION, SCALP 02/28/2009  . NEPHROLITHIASIS 07/01/2007  . CONTACT DERMATITIS 12/12/2006  . BPH with urinary obstruction 11/04/2006  . COLONIC POLYPS 10/21/2003  . DIVERTICULOSIS, COLON 10/21/2003    PMH: Past Medical History:  Diagnosis Date  . Barrett's esophageal ulceration   . BPH (benign prostatic hyperplasia)   . CAD (coronary artery disease)    sees Dr. Mar Daring  . Cataract    both eyes  . Colonic polyp   . Concussion with loss of consciousness of 30 minutes or less   . Contact dermatitis   . Contact with or exposure to venereal diseases   . COPD (chronic obstructive pulmonary disease) ([place])    sees Dr. Kara Mead   . Diverticulosis of colon   . Dysphagia   . GERD (gastroesophageal reflux disease)   . HNP (herniated nucleus pulposus), lumbar    recurrent  . Hyperlipidemia   . Hypertension   . Laceration of scalp   . Myocardial infarction [facility])    2011- stent placed  . Nephrolithiasis   . Pre-operative cardiovascular examination   . Rheumatoid arthritis [facility])    sees Dr. Gavin Pound   . SOB (shortness of breath)     PSH: Past Surgical History:  Procedure Laterality Date  . CARDIAC CATHETERIZATION N/A 01/19/2016   Procedure: Left Heart Cath  and Coronary Angiography;  Surgeon: Burnell Blanks, MD;  Location: [facility];  Service: Cardiovascular;  Laterality: N/A;  . COLONOSCOPY  12/09/2014   per Dr. Silverio Decamp, adenomatous polyps, repeat 3 years   . EGD with dilatation  02/17/09   Barretts esophagus   . heart stent    . Left knee surgery    . left knee surgery    . LUMBAR LAMINECTOMY/DECOMPRESSION MICRODISCECTOMY Right 10/07/2017   Procedure: RIGHT LUMBAR ONE-TWO LAMINECTOMY WITH MICRODISCECTOMY;  Surgeon: Consuella Lose, MD;  Location: [facility];  Service: Neurosurgery;   Laterality: Right;  . LUMBAR LAMINECTOMY/DECOMPRESSION MICRODISCECTOMY N/A 12/06/2017   Procedure: RECURRENT MICRODISCECTOMY LUMBAR ONE - LUMBAR TWO;  Surgeon: Consuella Lose, MD;  Location: [facility];  Service: Neurosurgery;  Laterality: N/A;  . TONSILLECTOMY       (Not in a hospital admission)  SH: Social History   Tobacco Use  . Smoking status: Former Smoker    Packs/day: 1.00    Years: 20.00    Pack years: 20.00    Types: Cigarettes    Last attempt to quit: 10/12/1988    Years since quitting: 29.1  . Smokeless tobacco: Never Used  . Tobacco comment: 1960's   Substance Use Topics  . Alcohol use: Yes    Alcohol/week: 0.0 standard drinks    Comment: rare  . Drug use: No    MEDS: Prior to Admission medications   Medication Sig Start Date End Date Taking? Authorizing Provider  aspirin EC 81 MG tablet Take 1 tablet (81 mg total) by mouth daily. 12/11/17   Costella, Vista Mink, PA-C  cetirizine (ZYRTEC) 10 MG tablet Take 10 mg by mouth daily.    [provider]  isosorbide mononitrate (IMDUR) 30 MG 24 hr tablet Take 1 tablet (30 mg total) by mouth daily. 01/26/16 12/05/17  Eileen Stanford, PA-C  leflunomide (ARAVA) 20 MG tablet Take 1 tablet (20 mg total) by mouth 2 (two) times daily. Patient taking differently: Take 20 mg by mouth every other day.  12/05/16   [name], MD  metoprolol succinate (TOPROL-XL) 25 MG 24 hr tablet TAKE 1 TABLET BY MOUTH DAILY Patient taking differently: Take 25 mg by mouth daily.  11/04/17   [name], MD  Multiple Vitamin (MULTIVITAMIN WITH MINERALS) TABS Take 1 tablet by mouth daily.    [provider]  naproxen (NAPROSYN) 500 MG tablet Take 500 mg by mouth 2 (two) times daily. 05/02/16   [provider]  nitroGLYCERIN (NITROSTAT) 0.4 MG SL tablet Place 1 tablet (0.4 mg total) under the tongue every 5 (five) minutes as needed for chest pain (3 doses max). 12/27/14   Josue Hector, MD  omeprazole (PRILOSEC) 40 MG  capsule Take 40 mg by mouth daily.    [provider]  predniSONE (DELTASONE) 5 MG tablet Take 5 mg by mouth daily as needed (arthritis).  09/21/17   [provider]  sildenafil (REVATIO) 20 MG tablet Take 20 mg by mouth daily as needed Patient taking differently: Take 20 mg by mouth daily as needed (erectile disfunction).  02/22/17   [name], MD  simvastatin (ZOCOR) 40 MG tablet Take 1 tablet (40 mg total) by mouth daily. Patient taking differently: Take 40 mg by mouth every evening.  12/05/16   [name], MD  tamsulosin (FLOMAX) 0.4 MG CAPS capsule TAKE ONE CAPSULE BY MOUTH DAILY Patient taking differently: Take 0.4 mg by mouth daily.  05/07/17   [name]  A, MD  zolpidem (AMBIEN) 10 MG tablet TAKE ONE TABLET BY MOUTH EVERY NIGHT AT BEDTIME Patient taking differently: Take 10 mg by mouth at bedtime.  11/08/17   [name], MD    ALLERGY: Allergies  Allergen Reactions  . Cephalexin Shortness Of Breath, Rash and Other (See Comments)    PATIENT HAS HAD A PCN REACTION WITH IMMEDIATE RASH, FACIAL/TONGUE/THROAT SWELLING, SOB, OR LIGHTHEADEDNESS WITH HYPOTENSION:  #  #  YES  #  #  Has patient had a PCN reaction causing severe rash involving mucus membranes or skin necrosis: No Has patient had a PCN reaction that required hospitalization: No Has patient had a PCN reaction occurring within the last 10 years: No If all of the above answers are "NO", then may proceed with Cephalosporin use.   . Clarithromycin Shortness Of Breath and Rash  . Certolizumab Pegol Hives  . Doxycycline Rash  . Hydroxyzine Rash  . Lidoderm Other (See Comments)    "made me act weird"  . Pyrithione Zinc Rash    Social History   Tobacco Use  . Smoking status: Former Smoker    Packs/day: 1.00    Years: 20.00    Pack years: 20.00    Types: Cigarettes    Last attempt to quit: 10/12/1988    Years since quitting: 29.1  . Smokeless tobacco: Never Used  . Tobacco comment: 1960's     Substance Use Topics  . Alcohol use: Yes    Alcohol/week: 0.0 standard drinks    Comment: rare     Family History  Problem Relation Age of Onset  . Heart attack Father        cardiovascular disorder  . Arthritis Father        family hx  . Colon cancer Father        mets  . Prostate cancer Father        1st degree relative  . Arthritis Mother   . Diabetes Paternal Uncle   . Colon cancer Paternal Uncle   . Cancer Maternal Grandmother   . Skin cancer Daughter   . Esophageal cancer Neg Hx   . Stomach cancer Neg Hx   . Rectal cancer Neg Hx      ROS   Review of Systems  Musculoskeletal: Positive for back pain.  Neurological: Negative for dizziness, tingling, tremors, sensory change, speech change, focal weakness, seizures, loss of consciousness, weakness and headaches.   unable to obtain, altered mental status  Exam   Vitals:   12/10/17 1500 12/10/17 1519  BP: (!) 142/103 (!) 160/100  Pulse:    Resp: 15   Temp:    SpO2:     General appearance: elderly male, laying flat with legs bent Eyes: No scleral injection Cardiovascular: Regular rate and rhythm without murmurs, rubs, gallops. No edema or variciosities. Distal pulses normal. Pulmonary: Effort normal, non-labored breathing Musculoskeletal:     Muscle tone upper extremities: Normal    Muscle tone lower extremities: Normal    Motor exam: MEAW with good strength, Mild b/l hip flexor and quad weakness, secondary to pain. Nonfocal exam Neurological Mental Status:    - Patient is awake, alert, oriented to self, but not year    - Patient is unable to give a clear and coherent history.    - No signs of aphasia or neglect Cranial Nerves    - II: Visual Fields are full. PERRL    - III/IV/VI: EOMI without ptosis or diploplia.     -  V: Facial sensation is grossly normal    - VII: Facial movement is symmetric.     - VIII: hearing is intact to voice    - X: Uvula elevates symmetrically    - XI: Shoulder shrug is  symmetric.    - XII: tongue is midline without atrophy or fasciculations.  Sensory: Sensation grossly intact to LT  Incision:  Clean, dry intact No fluctuance No redness No active drainage  Results - Imaging/Labs   Results for orders placed or performed during the hospital encounter of 12/10/17 (from the past 48 hour(s))  Urinalysis, Routine w reflex microscopic     Status: Abnormal   Collection Time: 12/10/17  2:12 PM  Result Value Ref Range   Color, Urine YELLOW YELLOW   APPearance HAZY (A) CLEAR   Specific Gravity, Urine 1.023 1.005 - 1.030   pH 5.0 5.0 - 8.0   Glucose, UA NEGATIVE NEGATIVE mg/dL   Hgb urine dipstick MODERATE (A) NEGATIVE   Bilirubin Urine NEGATIVE NEGATIVE   Ketones, ur NEGATIVE NEGATIVE mg/dL   Protein, ur 100 (A) NEGATIVE mg/dL   Nitrite NEGATIVE NEGATIVE   Leukocytes, UA NEGATIVE NEGATIVE   RBC / HPF 6-10 0 - 5 RBC/hpf   WBC, UA 0-5 0 - 5 WBC/hpf   Bacteria, UA RARE (A) NONE SEEN   Squamous Epithelial / LPF 0-5 0 - 5   Mucus PRESENT    Granular Casts, UA PRESENT     Comment: Performed at [facility], [address] [place], [place] [address]  CBG monitoring, ED     Status: Abnormal   Collection Time: 12/10/17  2:16 PM  Result Value Ref Range   Glucose-Capillary 174 (H) 70 - 99 mg/dL   Comment 1 Notify RN    Comment 2 Document in Chart   Comprehensive metabolic panel     Status: Abnormal   Collection Time: 12/10/17  2:18 PM  Result Value Ref Range   Sodium 135 135 - 145 mmol/L   Potassium 4.3 3.5 - 5.1 mmol/L   Chloride 99 98 - 111 mmol/L   CO2 24 22 - 32 mmol/L   Glucose, Bld 172 (H) 70 - 99 mg/dL   BUN 48 (H) 8 - 23 mg/dL   Creatinine, Ser 2.27 (H) 0.61 - 1.24 mg/dL   Calcium 10.5 (H) 8.9 - 10.3 mg/dL   Total Protein 7.0 6.5 - 8.1 g/dL   Albumin 2.9 (L) 3.5 - 5.0 g/dL   AST 58 (H) 15 - 41 U/L   ALT 32 0 - 44 U/L   Alkaline Phosphatase 48 38 - 126 U/L   Total Bilirubin 1.4 (H) 0.3 - 1.2 mg/dL   GFR calc non Af Amer 27 (L) >60  mL/min   GFR calc Af Amer 31 (L) >60 mL/min    Comment: (NOTE) The eGFR has been calculated using the CKD EPI equation. This calculation has not been validated in all clinical situations. eGFR's persistently <60 mL/min signify possible Chronic Kidney Disease.    Anion gap 12 5 - 15    Comment: Performed at [facility] [address], [place], [place] [address]  CBC with Differential     Status: None   Collection Time: 12/10/17  2:18 PM  Result Value Ref Range   WBC 5.2 4.0 - 10.5 K/uL   RBC 5.55 4.22 - 5.81 MIL/uL   Hemoglobin 16.2 13.0 - 17.0 g/dL   HCT 49.6 39.0 - 52.0 %   MCV 89.4  80.0 - 100.0 fL   MCH 29.2 26.0 - 34.0 pg   MCHC 32.7 30.0 - 36.0 g/dL   RDW 13.5 11.5 - 15.5 %   Platelets 150 150 - 400 K/uL   nRBC 0.0 0.0 - 0.2 %   Neutrophils Relative % 67 %   Neutro Abs 3.5 1.7 - 7.7 K/uL   Lymphocytes Relative 16 %   Lymphs Abs 0.8 0.7 - 4.0 K/uL   Monocytes Relative 17 %   Monocytes Absolute 0.9 0.1 - 1.0 K/uL   Eosinophils Relative 0 %   Eosinophils Absolute 0.0 0.0 - 0.5 K/uL   Basophils Relative 0 %   Basophils Absolute 0.0 0.0 - 0.1 K/uL   nRBC 0 0 /100 WBC   Burr Cells PRESENT     Comment: Performed at [facility], [place] [address] [place], [place] [address]  I-Stat CG4 Lactic Acid, ED     Status: Abnormal   Collection Time: 12/10/17  2:25 PM  Result Value Ref Range   Lactic Acid, Venous 3.33 (HH) 0.5 - 1.9 mmol/L   Comment NOTIFIED PHYSICIAN   I-stat troponin, ED     Status: None   Collection Time: 12/10/17  3:30 PM  Result Value Ref Range   Troponin i, poc 0.07 0.00 - 0.08 ng/mL   Comment 3            Comment: Due to the release kinetics of cTnI, a negative result within the first hours of the onset of symptoms does not rule out myocardial infarction with certainty. If myocardial infarction is still suspected, repeat the test at appropriate intervals.   I-Stat CG4 Lactic Acid, ED  (not at  [facility])     Status: Abnormal   Collection Time:  12/10/17  3:32 PM  Result Value Ref Range   Lactic Acid, Venous 4.28 (HH) 0.5 - 1.9 mmol/L   Comment NOTIFIED PHYSICIAN     Dg Chest Port 1 View  Result Date: 12/10/2017 CLINICAL DATA:  Altered mental status. Status post lumbar laminectomy and decompression 12/06/2017. EXAM: PORTABLE CHEST 1 VIEW COMPARISON:  PA and lateral chest 09/07/2017.  CT chest 11/27/2010. FINDINGS: The lungs are emphysematous but clear. Heart size is normal. No pneumothorax or pleural effusion. No acute or focal bony abnormality. IMPRESSION: Emphysema without acute disease. Electronically Signed   By: Inge Rise M.D.   On: 12/10/2017 15:30    Impression/Plan   72 y.o. male pod #4 s/p  Right L1-2 laminotomy with facetectomy,microdiscectomy for decompression of nerve root due to recurrent lumbar HNP and incomplete cauda equina syndrome. Intraop complicated by CSF leak. He is encephalopathic.His incision looks good and without signs of infection.  I have reviewed the labs performed by EDP. Primary findings are AKI (Cr now 2.27, GFR 27, BUN 48), elevated lactic acid x2. CBC without leukocytosis.    It would be highly unusual to have a spinal infection/wound infection this close to surgery and given how his wound looks in combation with non focal exam would hold on MRI L spine. Suspect symptoms are related to AKI. Rec admission for fluid resuscitation. As his mental status improves, will determine necessity for MRI L spine.

## 2017-12-10 NOTE — ED Notes (Signed)
Dr. Ralene Bathe at bedside. RN trying to get 2nd set of cultures. Antibiotics are at bedside.

## 2017-12-10 NOTE — ED Triage Notes (Signed)
Per ems-pt comes from home where he had recent back surgery and was d/c on 11/1. Was sent home with rx for oxycodone, it wasn't helping so he called the practice hotline, and was told to double it. He had been doing up until yesterday when he started urinating on himself (which he still is). He reports he is still taking 2 oxycodone q 4 hours and is peeing on himself. He has a friend who checks on him and she called EMS due to confusion, urination, and repetitive questioning. Pt is alert, disoriented to time. BP 128/96, HR 125, 90% RA, CBG 152.

## 2017-12-11 ENCOUNTER — Encounter (HOSPITAL_COMMUNITY)
Admission: EM | Disposition: A | Discharge status: Home Health | Payer: Self-pay | Source: Home / Self Care | Attending: Internal Medicine

## 2017-12-11 ENCOUNTER — Inpatient Hospital Stay (HOSPITAL_COMMUNITY): Payer: Medicare Other | Admitting: Anesthesiology

## 2017-12-11 ENCOUNTER — Inpatient Hospital Stay (HOSPITAL_COMMUNITY): Payer: Medicare Other

## 2017-12-11 DIAGNOSIS — Z9889 Other specified postprocedural states: Secondary | ICD-10-CM

## 2017-12-11 DIAGNOSIS — R7881 Bacteremia: Secondary | ICD-10-CM

## 2017-12-11 DIAGNOSIS — R1310 Dysphagia, unspecified: Secondary | ICD-10-CM

## 2017-12-11 DIAGNOSIS — Z87891 Personal history of nicotine dependence: Secondary | ICD-10-CM

## 2017-12-11 DIAGNOSIS — R42 Dizziness and giddiness: Secondary | ICD-10-CM

## 2017-12-11 DIAGNOSIS — Z881 Allergy status to other antibiotic agents status: Secondary | ICD-10-CM

## 2017-12-11 DIAGNOSIS — K5790 Diverticulosis of intestine, part unspecified, without perforation or abscess without bleeding: Secondary | ICD-10-CM

## 2017-12-11 DIAGNOSIS — Z955 Presence of coronary angioplasty implant and graft: Secondary | ICD-10-CM

## 2017-12-11 DIAGNOSIS — Z888 Allergy status to other drugs, medicaments and biological substances status: Secondary | ICD-10-CM

## 2017-12-11 DIAGNOSIS — G834 Cauda equina syndrome: Secondary | ICD-10-CM

## 2017-12-11 DIAGNOSIS — J449 Chronic obstructive pulmonary disease, unspecified: Secondary | ICD-10-CM

## 2017-12-11 DIAGNOSIS — I493 Ventricular premature depolarization: Secondary | ICD-10-CM

## 2017-12-11 DIAGNOSIS — H538 Other visual disturbances: Secondary | ICD-10-CM

## 2017-12-11 DIAGNOSIS — I252 Old myocardial infarction: Secondary | ICD-10-CM

## 2017-12-11 DIAGNOSIS — R531 Weakness: Secondary | ICD-10-CM

## 2017-12-11 DIAGNOSIS — K219 Gastro-esophageal reflux disease without esophagitis: Secondary | ICD-10-CM

## 2017-12-11 DIAGNOSIS — I2510 Atherosclerotic heart disease of native coronary artery without angina pectoris: Secondary | ICD-10-CM

## 2017-12-11 DIAGNOSIS — B9561 Methicillin susceptible Staphylococcus aureus infection as the cause of diseases classified elsewhere: Secondary | ICD-10-CM

## 2017-12-11 DIAGNOSIS — M0579 Rheumatoid arthritis with rheumatoid factor of multiple sites without organ or systems involvement: Secondary | ICD-10-CM

## 2017-12-11 DIAGNOSIS — J439 Emphysema, unspecified: Secondary | ICD-10-CM

## 2017-12-11 DIAGNOSIS — E782 Mixed hyperlipidemia: Secondary | ICD-10-CM

## 2017-12-11 DIAGNOSIS — M5126 Other intervertebral disc displacement, lumbar region: Secondary | ICD-10-CM

## 2017-12-11 DIAGNOSIS — I25118 Atherosclerotic heart disease of native coronary artery with other forms of angina pectoris: Secondary | ICD-10-CM

## 2017-12-11 DIAGNOSIS — M069 Rheumatoid arthritis, unspecified: Secondary | ICD-10-CM

## 2017-12-11 DIAGNOSIS — G9341 Metabolic encephalopathy: Secondary | ICD-10-CM

## 2017-12-11 HISTORY — PX: LUMBAR WOUND DEBRIDEMENT: SHX1988

## 2017-12-11 LAB — BLOOD CULTURE ID PANEL (REFLEXED)

## 2017-12-11 LAB — BASIC METABOLIC PANEL
Anion gap: 7 (ref 5–15)
BUN: 37 mg/dL — ABNORMAL HIGH (ref 8–23)
CO2: 22 mmol/L (ref 22–32)
Calcium: 9.3 mg/dL (ref 8.9–10.3)
Chloride: 110 mmol/L (ref 98–111)
Creatinine, Ser: 1.71 mg/dL — ABNORMAL HIGH (ref 0.61–1.24)
GFR calc Af Amer: 44 mL/min — ABNORMAL LOW (ref >60)
GFR calc non Af Amer: 38 mL/min — ABNORMAL LOW (ref >60)
Glucose, Bld: 152 mg/dL — ABNORMAL HIGH (ref 70–99)
Potassium: 3.9 mmol/L (ref 3.5–5.1)
Sodium: 139 mmol/L (ref 135–145)

## 2017-12-11 LAB — CBC
HCT: 41.3 % (ref 39.0–52.0)
Hemoglobin: 13.4 g/dL (ref 13.0–17.0)
MCH: 28.3 pg (ref 26.0–34.0)
MCHC: 32.4 g/dL (ref 30.0–36.0)
MCV: 87.1 fL (ref 80.0–100.0)
Platelets: 99 10*3/uL — ABNORMAL LOW (ref 150–400)
RBC: 4.74 MIL/uL (ref 4.22–5.81)
RDW: 13.6 % (ref 11.5–15.5)
WBC: 4.8 10*3/uL (ref 4.0–10.5)
nRBC: 0 % (ref 0.0–0.2)

## 2017-12-11 LAB — C-REACTIVE PROTEIN: CRP: 21.4 mg/dL — ABNORMAL HIGH (ref <1.0)

## 2017-12-11 LAB — LACTIC ACID, PLASMA: Lactic Acid, Venous: 3.9 mmol/L (ref 0.5–1.9)

## 2017-12-11 SURGERY — LUMBAR WOUND DEBRIDEMENT
Anesthesia: General

## 2017-12-11 MED ORDER — ROCURONIUM BROMIDE 50 MG/5ML IV SOSY
PREFILLED_SYRINGE | INTRAVENOUS | Status: AC
  Filled 2017-12-11: qty 5

## 2017-12-11 MED ORDER — HYDROCODONE-ACETAMINOPHEN 5-325 MG PO TABS
1.0000 | ORAL_TABLET | ORAL | Status: DC | PRN
  Administered 2017-12-11: 1 via ORAL
  Filled 2017-12-11: qty 1

## 2017-12-11 MED ORDER — SODIUM CHLORIDE 0.9 % IV SOLN: INTRAVENOUS | Status: DC

## 2017-12-11 MED ORDER — PHENYLEPHRINE HCL 10 MG/ML IJ SOLN
INTRAMUSCULAR | Status: AC
  Filled 2017-12-11: qty 1

## 2017-12-11 MED ORDER — ZOLPIDEM TARTRATE 5 MG PO TABS
5.0000 mg | ORAL_TABLET | Freq: Every evening | ORAL | Status: DC | PRN
  Administered 2017-12-15: 5 mg via ORAL
  Filled 2017-12-11: qty 1

## 2017-12-11 MED ORDER — ONDANSETRON HCL 4 MG/2ML IJ SOLN
INTRAMUSCULAR | Status: AC
  Filled 2017-12-11: qty 4

## 2017-12-11 MED ORDER — MENTHOL 3 MG MT LOZG: 1.0000 | LOZENGE | OROMUCOSAL | Status: DC | PRN

## 2017-12-11 MED ORDER — FENTANYL CITRATE (PF) 250 MCG/5ML IJ SOLN
INTRAMUSCULAR | Status: AC
  Filled 2017-12-11: qty 5

## 2017-12-11 MED ORDER — BUPIVACAINE HCL (PF) 0.5 % IJ SOLN
INTRAMUSCULAR | Status: AC
  Filled 2017-12-11: qty 30

## 2017-12-11 MED ORDER — HYDROCODONE-ACETAMINOPHEN 10-325 MG PO TABS: 1.0000 | ORAL_TABLET | ORAL | Status: DC | PRN

## 2017-12-11 MED ORDER — SODIUM CHLORIDE 0.9% FLUSH: 3.0000 mL | INTRAVENOUS | Status: DC | PRN

## 2017-12-11 MED ORDER — FENTANYL CITRATE (PF) 100 MCG/2ML IJ SOLN: 25.0000 ug | INTRAMUSCULAR | Status: DC | PRN

## 2017-12-11 MED ORDER — LIDOCAINE-EPINEPHRINE 1 %-1:100000 IJ SOLN
INTRAMUSCULAR | Status: AC
  Filled 2017-12-11: qty 1

## 2017-12-11 MED ORDER — LIDOCAINE 2% (20 MG/ML) 5 ML SYRINGE
INTRAMUSCULAR | Status: AC
  Filled 2017-12-11: qty 5

## 2017-12-11 MED ORDER — LACTATED RINGERS IV SOLN
INTRAVENOUS | Status: DC
  Administered 2017-12-11: 14:00:00 via INTRAVENOUS

## 2017-12-11 MED ORDER — METHOCARBAMOL 1000 MG/10ML IJ SOLN
500.0000 mg | Freq: Four times a day (QID) | INTRAVENOUS | Status: DC | PRN
  Filled 2017-12-11: qty 5

## 2017-12-11 MED ORDER — SUCCINYLCHOLINE CHLORIDE 20 MG/ML IJ SOLN
INTRAMUSCULAR | Status: DC | PRN
  Administered 2017-12-11: 120 mg via INTRAVENOUS

## 2017-12-11 MED ORDER — BACITRACIN ZINC 500 UNIT/GM EX OINT
TOPICAL_OINTMENT | CUTANEOUS | Status: DC | PRN
  Administered 2017-12-11: 1 via TOPICAL

## 2017-12-11 MED ORDER — HEMOSTATIC AGENTS (NO CHARGE) OPTIME
TOPICAL | Status: DC | PRN
  Administered 2017-12-11: 1 via TOPICAL

## 2017-12-11 MED ORDER — LIDOCAINE HCL (CARDIAC) PF 100 MG/5ML IV SOSY
PREFILLED_SYRINGE | INTRAVENOUS | Status: DC | PRN
  Administered 2017-12-11: 100 mg via INTRATRACHEAL

## 2017-12-11 MED ORDER — SUGAMMADEX SODIUM 200 MG/2ML IV SOLN
INTRAVENOUS | Status: DC | PRN
  Administered 2017-12-11: 200 mg via INTRAVENOUS

## 2017-12-11 MED ORDER — SODIUM CHLORIDE 0.9 % IR SOLN
Status: DC | PRN
  Administered 2017-12-11: 3000 mL

## 2017-12-11 MED ORDER — METOPROLOL TARTRATE 5 MG/5ML IV SOLN: 5.0000 mg | Freq: Once | INTRAVENOUS | Status: DC

## 2017-12-11 MED ORDER — 0.9 % SODIUM CHLORIDE (POUR BTL) OPTIME
TOPICAL | Status: DC | PRN
  Administered 2017-12-11: 1000 mL

## 2017-12-11 MED ORDER — METHOCARBAMOL 500 MG PO TABS: 500.0000 mg | ORAL_TABLET | Freq: Four times a day (QID) | ORAL | Status: DC | PRN

## 2017-12-11 MED ORDER — SODIUM CHLORIDE 0.9 % IV SOLN
250.0000 mL | INTRAVENOUS | Status: DC
  Administered 2017-12-12: 250 mL via INTRAVENOUS

## 2017-12-11 MED ORDER — ROCURONIUM BROMIDE 100 MG/10ML IV SOLN
INTRAVENOUS | Status: DC | PRN
  Administered 2017-12-11: 50 mg via INTRAVENOUS

## 2017-12-11 MED ORDER — ONDANSETRON HCL 4 MG PO TABS: 4.0000 mg | ORAL_TABLET | Freq: Four times a day (QID) | ORAL | Status: DC | PRN

## 2017-12-11 MED ORDER — ACETAMINOPHEN 650 MG RE SUPP: 650.0000 mg | RECTAL | Status: DC | PRN

## 2017-12-11 MED ORDER — ONDANSETRON HCL 4 MG/2ML IJ SOLN
INTRAMUSCULAR | Status: DC | PRN
  Administered 2017-12-11: 4 mg via INTRAVENOUS

## 2017-12-11 MED ORDER — THROMBIN 5000 UNITS EX SOLR
OROMUCOSAL | Status: DC | PRN
  Administered 2017-12-11: 16:00:00 via TOPICAL

## 2017-12-11 MED ORDER — ACETAMINOPHEN 325 MG PO TABS
650.0000 mg | ORAL_TABLET | ORAL | Status: DC | PRN
  Administered 2017-12-12 – 2017-12-14 (×5): 650 mg via ORAL
  Filled 2017-12-11 (×5): qty 2

## 2017-12-11 MED ORDER — HYDROMORPHONE HCL 1 MG/ML IJ SOLN: 0.5000 mg | INTRAMUSCULAR | Status: DC | PRN

## 2017-12-11 MED ORDER — FENTANYL CITRATE (PF) 250 MCG/5ML IJ SOLN
INTRAMUSCULAR | Status: DC | PRN
  Administered 2017-12-11 (×3): 50 ug via INTRAVENOUS
  Administered 2017-12-11: 100 ug via INTRAVENOUS
  Administered 2017-12-11: 50 ug via INTRAVENOUS

## 2017-12-11 MED ORDER — SODIUM CHLORIDE 0.9 % IV SOLN
INTRAVENOUS | Status: DC | PRN
  Administered 2017-12-11: 16:00:00

## 2017-12-11 MED ORDER — SODIUM CHLORIDE 0.9% FLUSH
3.0000 mL | Freq: Two times a day (BID) | INTRAVENOUS | Status: DC
  Administered 2017-12-11 – 2017-12-12 (×2): 3 mL via INTRAVENOUS

## 2017-12-11 MED ORDER — PROPOFOL 10 MG/ML IV BOLUS
INTRAVENOUS | Status: DC | PRN
  Administered 2017-12-11: 180 mg via INTRAVENOUS

## 2017-12-11 MED ORDER — OXYCODONE HCL 5 MG PO TABS
5.0000 mg | ORAL_TABLET | ORAL | Status: DC | PRN
  Administered 2017-12-11 – 2017-12-12 (×2): 10 mg via ORAL
  Administered 2017-12-12 – 2017-12-13 (×2): 5 mg via ORAL
  Administered 2017-12-13: 10 mg via ORAL
  Administered 2017-12-14 – 2017-12-15 (×2): 5 mg via ORAL
  Filled 2017-12-11: qty 1
  Filled 2017-12-11 (×2): qty 2
  Filled 2017-12-11 (×3): qty 1
  Filled 2017-12-11: qty 2

## 2017-12-11 MED ORDER — PROPOFOL 10 MG/ML IV BOLUS
INTRAVENOUS | Status: AC
  Filled 2017-12-11: qty 20

## 2017-12-11 MED ORDER — FLEET ENEMA 7-19 GM/118ML RE ENEM: 1.0000 | ENEMA | Freq: Once | RECTAL | Status: DC | PRN

## 2017-12-11 MED ORDER — PHENOL 1.4 % MT LIQD: 1.0000 | OROMUCOSAL | Status: DC | PRN

## 2017-12-11 MED ORDER — THROMBIN 5000 UNITS EX SOLR
CUTANEOUS | Status: AC
  Filled 2017-12-11: qty 15000

## 2017-12-11 MED ORDER — EPHEDRINE 5 MG/ML INJ
INTRAVENOUS | Status: AC
  Filled 2017-12-11: qty 10

## 2017-12-11 MED ORDER — ONDANSETRON HCL 4 MG/2ML IJ SOLN: 4.0000 mg | Freq: Four times a day (QID) | INTRAMUSCULAR | Status: DC | PRN

## 2017-12-11 MED ORDER — THROMBIN 5000 UNITS EX SOLR
CUTANEOUS | Status: DC | PRN
  Administered 2017-12-11 (×2): 5000 [IU] via TOPICAL

## 2017-12-11 SURGICAL SUPPLY — 46 items
BAG DECANTER FOR FLEXI CONT (MISCELLANEOUS) ×2 IMPLANT
CANISTER SUCT 3000ML PPV (MISCELLANEOUS) ×2 IMPLANT
CARTRIDGE OIL MAESTRO DRILL (MISCELLANEOUS) ×1 IMPLANT
COVER WAND RF STERILE (DRAPES) ×2 IMPLANT
DRAPE LAPAROTOMY 100X72X124 (DRAPES) ×2 IMPLANT
DRAPE POUCH INSTRU U-SHP 10X18 (DRAPES) ×2 IMPLANT
DRSG OPSITE 4X5.5 SM (GAUZE/BANDAGES/DRESSINGS) ×1 IMPLANT
DRSG OPSITE POSTOP 4X6 (GAUZE/BANDAGES/DRESSINGS) ×1 IMPLANT
ELECT REM PT RETURN 9FT ADLT (ELECTROSURGICAL) ×2
ELECTRODE REM PT RTRN 9FT ADLT (ELECTROSURGICAL) ×1 IMPLANT
EVACUATOR 1/8 PVC DRAIN (DRAIN) ×1 IMPLANT
GAUZE 4X4 16PLY RFD (DISPOSABLE) IMPLANT
GAUZE SPONGE 4X4 12PLY STRL (GAUZE/BANDAGES/DRESSINGS) ×1 IMPLANT
GLOVE BIO SURGEON STRL SZ7 (GLOVE) IMPLANT
GLOVE BIO SURGEON STRL SZ7.5 (GLOVE) ×1 IMPLANT
GLOVE BIOGEL PI IND STRL 6.5 (GLOVE) IMPLANT
GLOVE BIOGEL PI IND STRL 7.0 (GLOVE) IMPLANT
GLOVE BIOGEL PI IND STRL 7.5 (GLOVE) ×1 IMPLANT
GLOVE BIOGEL PI INDICATOR 6.5 (GLOVE) ×1
GLOVE BIOGEL PI INDICATOR 7.0 (GLOVE)
GLOVE BIOGEL PI INDICATOR 7.5 (GLOVE) ×3
GLOVE ECLIPSE 7.0 STRL STRAW (GLOVE) ×2 IMPLANT
GLOVE ECLIPSE 7.5 STRL STRAW (GLOVE) ×3 IMPLANT
GLOVE SURG SS PI 6.0 STRL IVOR (GLOVE) ×1 IMPLANT
GOWN STRL REUS W/ TWL LRG LVL3 (GOWN DISPOSABLE) ×1 IMPLANT
GOWN STRL REUS W/ TWL XL LVL3 (GOWN DISPOSABLE) IMPLANT
GOWN STRL REUS W/TWL 2XL LVL3 (GOWN DISPOSABLE) ×1 IMPLANT
GOWN STRL REUS W/TWL LRG LVL3 (GOWN DISPOSABLE) ×6
GOWN STRL REUS W/TWL XL LVL3 (GOWN DISPOSABLE)
HEMOSTAT POWDER KIT SURGIFOAM (HEMOSTASIS) ×1 IMPLANT
KIT BASIN OR (CUSTOM PROCEDURE TRAY) ×2 IMPLANT
KIT TURNOVER KIT B (KITS) ×2 IMPLANT
NEEDLE HYPO 22GX1.5 SAFETY (NEEDLE) ×2 IMPLANT
NS IRRIG 1000ML POUR BTL (IV SOLUTION) ×2 IMPLANT
OIL CARTRIDGE MAESTRO DRILL (MISCELLANEOUS)
PACK LAMINECTOMY NEURO (CUSTOM PROCEDURE TRAY) ×2 IMPLANT
PAD ARMBOARD 7.5X6 YLW CONV (MISCELLANEOUS) ×6 IMPLANT
SPONGE SURGIFOAM ABS GEL SZ50 (HEMOSTASIS) ×1 IMPLANT
SUT VIC AB 0 CT1 18XCR BRD8 (SUTURE) ×1 IMPLANT
SUT VIC AB 0 CT1 8-18 (SUTURE) ×4
SUT VICRYL 3-0 RB1 18 ABS (SUTURE) ×4 IMPLANT
SWAB COLLECTION DEVICE MRSA (MISCELLANEOUS) ×2 IMPLANT
SWAB CULTURE ESWAB REG 1ML (MISCELLANEOUS) ×2 IMPLANT
TOWEL GREEN STERILE (TOWEL DISPOSABLE) ×2 IMPLANT
TOWEL GREEN STERILE FF (TOWEL DISPOSABLE) ×2 IMPLANT
WATER STERILE IRR 1000ML POUR (IV SOLUTION) ×2 IMPLANT

## 2017-12-11 NOTE — Progress Notes (Signed)
Dr Hilbert Bible returned called with orders to continue to monitor and let patient get some res.  To call back if patient sustains heart rate 130 or greater and hold Lopressor.

## 2017-12-11 NOTE — Progress Notes (Signed)
Message sent to Dr Lamar Blinks of patient ts blood pressure staying 160's/ 100's since admission at 2130 to please advise with no blood pressure for several days,

## 2017-12-11 NOTE — Consult Note (Signed)
[facility] for Infectious Disease    Date of Admission:  12/10/2017   Total days of antibiotics 1        Day 1 vancomycin                Reason for Consult: MSSA Bacteremia     Referring Provider: CHAMP autoconsult  Primary Care Provider: [name], MD   Assessment: Tyrone Roman is a 72 y.o. male who recently underwent L1-2 microdiskectomy for herniated disc with caude equina syndrome and is now POD 5 from that procedure. He developed worsened back pain and developed AMS/lethargy and increased lower extremity weakness that prompted evaluation in emergency room. He was found to be bacteremic with gram positive cocci in 2/2 blood sets with MSSA on BCID. He has what sounds to be significant allergy to penicillin/cephalosporins. Will continue vancomycin for him. Repeat blood cultures in AM. Would check intraoperative cultures for stain and culture as source likely d/t wound infection following surgery. Would check TTE for him - no cardiac devices or murmur on exam.   He is reporting neurologic changes including blurry vision and has some delayed/garbled speech, confusion to me. He presented initially pretty hypertensive and apparently unwitnessed and found with head on recliner and on knees. He does not recall hitting his head. He has not had any pain medications to explain being altered. These findings could possibly be all due to infection however would think imaging to ensure no other process would be warranted - discussed with NSGY PA and Dr. Wynelle Cleveland.   Plan: 1. Continue vancomycin  2. Repeat blood cultures tomorrow AM 3. Check TTE 4. Head CT scan STAT 5. intraop wound cultures please.   Principal Problem:   Sepsis ([place]) Active Problems:   Hyperlipidemia   Coronary artery disease of native artery of native heart with stable angina pectoris (HCC)   GERD   COPD (chronic obstructive pulmonary disease) (HCC)   Rheumatoid arthritis (HCC)   HNP (herniated nucleus  pulposus), lumbar   Altered mental state   AKI (acute kidney injury) ([place])   Constipation due to pain medication   . heparin  5,000 Units Subcutaneous Q8H  . metoprolol succinate  25 mg Oral Daily  . metoprolol tartrate  5 mg Intravenous Once  . multivitamin with minerals  1 tablet Oral Daily  . pantoprazole  40 mg Oral Daily  . simvastatin  40 mg Oral QPM  . tamsulosin  0.4 mg Oral QPM    HPI: Tyrone Roman is a 72 y.o. male with pmhx detailed below but significant for CAD, MI 2011 s/p stents, COPD, diverticulosis, dysphagia, GERD, herniated nucleus pulposus of lumbar spine, RA. Underwent L1-2 microdiscectomy and laminotomy d/t herniation and cauda equina syndrome with Dr. Kathyrn Sheriff on 12/06/17.   He presented on 11/05 with AMS, worsening back pain, and increased lower extremity weakness. He did not have any fevers to report. Since his surgery he has had a friend staying with him to help out with recovery. Saturday he started reporting increased pain and then developed more lethargy after increasing oxycocone to q2h dosing (last dose Saturday afternoon). In the ER he was found to be intermittently agitated/confused and hypertensive. NSGY saw him and felt the surgical site appeared to be healing well and w/o concern for infection. Blood cultures were drawn and he was started on antibiotic therapy with aztreonam, vancomycin and flagyl. CXR normal and urine unrevealing. Blood cultures growing GPCs in both  sets from 11/05; no methicillin resistance detected on BCID. He is also now having purulent drainage from his back wound and the plan will be to take him back to OR this afternoon for a washout.   His neighbor/friend is at the bedside to facilitate most of the history as the patient is intermittently falling asleep and confused at times. He tells me he does remember falling last night and reports he had dizziness that started prior to him falling that caused him to lose his balance. He is now  having blurry vision that he reports started today but not certain as to when and feels in general very uncoordinated.   He has an allergy listed to cephalexin and reports hives and breathing trouble after getting penicillin.   Review of Systems  Constitutional: Negative for fever.  HENT: Negative for sore throat.   Eyes: Positive for blurred vision. Negative for pain.  Respiratory: Negative for cough and shortness of breath.   Cardiovascular: Negative for chest pain.  Gastrointestinal: Negative for abdominal pain, diarrhea and vomiting.  Genitourinary: Negative for dysuria.  Musculoskeletal: Positive for back pain (lower back ).  Skin: Negative for rash.  Neurological: Positive for dizziness and weakness. Negative for headaches.    Past Medical History:  Diagnosis Date  . Barrett's esophageal ulceration   . BPH (benign prostatic hyperplasia)   . CAD (coronary artery disease)    sees Dr. Mar Daring  . Cataract    both eyes  . Colonic polyp   . Concussion with loss of consciousness of 30 minutes or less   . Contact dermatitis   . Contact with or exposure to venereal diseases   . COPD (chronic obstructive pulmonary disease) ([place])    sees Dr. Kara Mead   . Diverticulosis of colon   . Dysphagia   . GERD (gastroesophageal reflux disease)   . HNP (herniated nucleus pulposus), lumbar    recurrent  . Hyperlipidemia   . Hypertension   . Laceration of scalp   . Myocardial infarction [facility])    2011- stent placed  . Nephrolithiasis   . Pre-operative cardiovascular examination   . Rheumatoid arthritis [facility])    sees Dr. Gavin Pound   . SOB (shortness of breath)     Social History   Tobacco Use  . Smoking status: Former Smoker    Packs/day: 1.00    Years: 20.00    Pack years: 20.00    Types: Cigarettes    Last attempt to quit: 10/12/1988    Years since quitting: 29.1  . Smokeless tobacco: Never Used  . Tobacco comment: 1960's   Substance Use Topics  . Alcohol use: Yes     Alcohol/week: 0.0 standard drinks    Comment: rare  . Drug use: No    Family History  Problem Relation Age of Onset  . Heart attack Father        cardiovascular disorder  . Arthritis Father        family hx  . Colon cancer Father        mets  . Prostate cancer Father        1st degree relative  . Arthritis Mother   . Diabetes Paternal Uncle   . Colon cancer Paternal Uncle   . Cancer Maternal Grandmother   . Skin cancer Daughter   . Esophageal cancer Neg Hx   . Stomach cancer Neg Hx   . Rectal cancer Neg Hx    Allergies  Allergen Reactions  .  Cephalexin Shortness Of Breath, Rash and Other (See Comments)    PATIENT HAS HAD A PCN REACTION WITH IMMEDIATE RASH, FACIAL/TONGUE/THROAT SWELLING, SOB, OR LIGHTHEADEDNESS WITH HYPOTENSION:  #  #  YES  #  #  Has patient had a PCN reaction causing severe rash involving mucus membranes or skin necrosis: No Has patient had a PCN reaction that required hospitalization: No Has patient had a PCN reaction occurring within the last 10 years: No If all of the above answers are "NO", then may proceed with Cephalosporin use.   . Clarithromycin Shortness Of Breath and Rash  . Certolizumab Pegol Hives  . Doxycycline Rash  . Hydroxyzine Rash  . Lidoderm Other (See Comments)    "made me act weird"  . Pyrithione Zinc Rash    OBJECTIVE: Blood pressure (!) 134/101, pulse (!) 105, temperature 98.1 F (36.7 C), temperature source Oral, resp. rate 20, height 6\' 2"  (1.88 m), weight 105.7 kg, SpO2 93 %.  Physical Exam  Constitutional: He is oriented to person, place, and time. He appears well-developed and well-nourished.  Resting in bed. Sleeping intermittently.   HENT:  Mouth/Throat: Oropharynx is clear and moist and mucous membranes are normal. Normal dentition. No dental abscesses.  Cardiovascular: Normal rate, regular rhythm and normal heart sounds.  Sinus rhythm/tachy on tele with frequent PVCs  Pulmonary/Chest: Effort normal and breath sounds  normal.  Abdominal: Soft. He exhibits no distension. There is no tenderness.  Lymphadenopathy:    He has no cervical adenopathy.  Neurological: He is alert and oriented to person, place, and time.  Speech is slow/delayed and seems a little garbled. Upper and lower extremity grip/strength is equal. He is oriented to place and situation and time. Fine motor coordination seems off.   Skin: Skin is warm and dry. No rash noted.  Did not assess back wound as he is preparing for surgery but reviewed notes re: purulent drainage.   Psychiatric: He has a normal mood and affect. Judgment normal.     Vitals reviewed.   Lab Results Lab Results  Component Value Date   WBC 4.8 12/11/2017   HGB 13.4 12/11/2017   HCT 41.3 12/11/2017   MCV 87.1 12/11/2017   PLT 99 (L) 12/11/2017    Lab Results  Component Value Date   CREATININE 1.71 (H) 12/11/2017   BUN 37 (H) 12/11/2017   NA 139 12/11/2017   K 3.9 12/11/2017   CL 110 12/11/2017   CO2 22 12/11/2017    Lab Results  Component Value Date   ALT 32 12/10/2017   AST 58 (H) 12/10/2017   ALKPHOS 48 12/10/2017   BILITOT 1.4 (H) 12/10/2017     Microbiology: Recent Results (from the past 240 hour(s))  Blood Culture (routine x 2)     Status: None (Preliminary result)   Collection Time: 12/10/17  2:43 PM  Result Value Ref Range Status   Specimen Description BLOOD RIGHT ANTECUBITAL  Final   Special Requests   Final    BOTTLES DRAWN AEROBIC AND ANAEROBIC Blood Culture adequate volume   Culture  Setup Time   Final    GRAM POSITIVE COCCI IN BOTH AEROBIC AND ANAEROBIC BOTTLES CRITICAL RESULT CALLED TO, READ BACK BY AND VERIFIED WITH: M. PHAM, PHARMD AT 0845 ON 12/11/17 BY C. JESSUP, MLT. Performed at [facility], [place] [address], [place], [place] [address]    Culture [facility] POSITIVE COCCI  Final   Report Status PENDING  Incomplete  Blood Culture ID Panel (Reflexed)  Status: Abnormal   Collection Time: 12/10/17  2:43 PM  Result Value Ref  Range Status   Enterococcus species NOT DETECTED NOT DETECTED Final   Listeria monocytogenes NOT DETECTED NOT DETECTED Final   Staphylococcus species DETECTED (A) NOT DETECTED Final    Comment: CRITICAL RESULT CALLED TO, READ BACK BY AND VERIFIED WITH: M. PHAM, PHARMD AT 0845 ON 12/11/17 BY C. JESSUP, MLT.    Staphylococcus aureus (BCID) DETECTED (A) NOT DETECTED Final    Comment: Methicillin (oxacillin) susceptible Staphylococcus aureus (MSSA). Preferred therapy is anti staphylococcal beta lactam antibiotic (Cefazolin or Nafcillin), unless clinically contraindicated. CRITICAL RESULT CALLED TO, READ BACK BY AND VERIFIED WITH: M. PHAM, PHARMD AT 0845 ON 12/11/17 BY C. JESSUP, MLT.    Methicillin resistance NOT DETECTED NOT DETECTED Final   Streptococcus species NOT DETECTED NOT DETECTED Final   Streptococcus agalactiae NOT DETECTED NOT DETECTED Final   Streptococcus pneumoniae NOT DETECTED NOT DETECTED Final   Streptococcus pyogenes NOT DETECTED NOT DETECTED Final   Acinetobacter baumannii NOT DETECTED NOT DETECTED Final   Enterobacteriaceae species NOT DETECTED NOT DETECTED Final   Enterobacter cloacae complex NOT DETECTED NOT DETECTED Final   Escherichia coli NOT DETECTED NOT DETECTED Final   Klebsiella oxytoca NOT DETECTED NOT DETECTED Final   Klebsiella pneumoniae NOT DETECTED NOT DETECTED Final   Proteus species NOT DETECTED NOT DETECTED Final   Serratia marcescens NOT DETECTED NOT DETECTED Final   Haemophilus influenzae NOT DETECTED NOT DETECTED Final   Neisseria meningitidis NOT DETECTED NOT DETECTED Final   Pseudomonas aeruginosa NOT DETECTED NOT DETECTED Final   Candida albicans NOT DETECTED NOT DETECTED Final   Candida glabrata NOT DETECTED NOT DETECTED Final   Candida krusei NOT DETECTED NOT DETECTED Final   Candida parapsilosis NOT DETECTED NOT DETECTED Final   Candida tropicalis NOT DETECTED NOT DETECTED Final    Comment: Performed at [facility], [place] [address] [place], [place] [address]  Blood Culture (routine x 2)     Status: None (Preliminary result)   Collection Time: 12/10/17  3:30 PM  Result Value Ref Range Status   Specimen Description BLOOD RIGHT ARM  Final   Special Requests   Final    BOTTLES DRAWN AEROBIC AND ANAEROBIC Blood Culture adequate volume   Culture  Setup Time   Final    GRAM POSITIVE COCCI IN BOTH AEROBIC AND ANAEROBIC BOTTLES CRITICAL RESULT CALLED TO, READ BACK BY AND VERIFIED WITH: M. PHAM, PHARMD, AT 0845 ON 12/11/17 BY C. JESSUP, MLT. Performed at [facility], [place] [address], [place], [place] [address]    Culture GRAM POSITIVE COCCI  Final   Report Status PENDING  Incomplete  MRSA PCR Screening     Status: None   Collection Time: 12/10/17  9:26 PM  Result Value Ref Range Status   MRSA by PCR NEGATIVE NEGATIVE Final    Comment:        The GeneXpert MRSA Assay (FDA approved for NASAL specimens only), is one component of a comprehensive MRSA colonization surveillance program. It is not intended to diagnose MRSA infection nor to guide or monitor treatment for MRSA infections. Performed at [facility], [place] [address], [place],  [address]     Janene Madeira, MSN, NP-C [facility] for Infectious [place] Cell: [phone] Pager: [phone]  12/11/2017 10:29 AM

## 2017-12-11 NOTE — Anesthesia Preprocedure Evaluation (Signed)
Anesthesia Evaluation  Patient identified by MRN, date of birth, ID band Patient awake    Reviewed: Allergy & Precautions, NPO status , Patient's Chart, lab work & pertinent test results  Airway Mallampati: II  TM Distance: >3 FB     Dental   Pulmonary shortness of breath, COPD, former smoker,    breath sounds clear to auscultation       Cardiovascular hypertension, + angina + CAD and + Past MI   Rhythm:Regular Rate:Normal     Neuro/Psych    GI/Hepatic Neg liver ROS, PUD, GERD  ,  Endo/Other    Renal/GU Renal disease     Musculoskeletal  (+) Arthritis ,   Abdominal   Peds  Hematology   Anesthesia Other Findings   Reproductive/Obstetrics                             Anesthesia Physical Anesthesia Plan  ASA: III  Anesthesia Plan: General   Post-op Pain Management:    Induction: Intravenous  PONV Risk Score and Plan: Ondansetron, Dexamethasone, Midazolam and Treatment may vary due to age or medical condition  Airway Management Planned: Oral ETT  Additional Equipment:   Intra-op Plan:   Post-operative Plan: Possible Post-op intubation/ventilation  Informed Consent: I have reviewed the patients History and Physical, chart, labs and discussed the procedure including the risks, benefits and alternatives for the proposed anesthesia with the patient or authorized representative who has indicated his/her understanding and acceptance.   Dental advisory given  Plan Discussed with: CRNA and Anesthesiologist  Anesthesia Plan Comments:         Anesthesia Quick Evaluation

## 2017-12-11 NOTE — Progress Notes (Signed)
Upon entering room found patient on the floor on his knees with his head on the chair after having multiple large bowel movements on the floor shortly after girlfriend had departed.  Patient required maxim assist back to the bed however he could move all extemites and stated "I had to use the bathroom and did not want to do so this while may girlfriend was here but then I could not get back in the bed." Patient denied hitting his head with no new new markings found on his body except some bloody drainage on the Honeycomb dressing placed earlier to his site. Page Dr [facility] Bible .  112/86, 115, 17 93%.

## 2017-12-11 NOTE — Progress Notes (Signed)
SWOT Nurse present and ready for transport to CT.

## 2017-12-11 NOTE — Progress Notes (Signed)
  NEUROSURGERY PROGRESS NOTE   Events last night noted. Remains confused at times Complains of continued back pain Denies neck pain, photophobia, photophobia  EXAM:  BP (!) 142/96 (BP Location: Right Arm)   Pulse (!) 107   Temp 98.1 F (36.7 C) (Oral)   Resp 20   Ht 6\' 2"  (1.88 m)   Wt 105.7 kg   SpO2 93%   BMI 29.92 kg/m   Awake, alert Oriented to self and location CN grossly intact  MAEW, nonfocal exam Incision: green/yellow thick discharge on bandage. No active drainage  IMPRESSION/PLAN 72 y.o. male pod#5 s/p L1-2  Microdiscectomy admitted due to altered mental status in the setting of AKI. On broad spectrum abx due to concern over sepsis.  Remains confused at times. New discharge from wound seen on bandage noted today.  - wound cultures ordered. Still no leukocytosis. - AKI: improving with IVF. Continue management per primary team - Appreciate care by [facility]

## 2017-12-11 NOTE — Progress Notes (Signed)
RN notified via phone from lab regarding patient's elevated Lactic Acid and anaerobic wound sample. Torrie Mayers, PA notified and provided updates. Patient's PCP Dr. Wynelle Cleveland, MD also made aware of critical lab values. Plan is for patient to have a STAT head CT prior to going to OR. RN notified SWOT for assistance with transport. Also, Per PA, RN advised not to worry about collecting aerobic wound sample at this time. Team will collect samples during lumbar wound debridement.

## 2017-12-11 NOTE — Progress Notes (Signed)
RN currently at patient's bedside with PA. Per this assessment, patient's wound noted to be leaking pus. Wound site swabbed, PA to notify MD, dressing change material at the bedside.

## 2017-12-11 NOTE — Anesthesia Postprocedure Evaluation (Signed)
Anesthesia Post Note  Patient: Tyrone Roman  Procedure(s) Performed: LUMBAR WOUND DEBRIDEMENT (N/A )     Anesthesia Post Evaluation  Last Vitals:  Vitals:   12/11/17 1745 12/11/17 1755  BP:  135/80  Pulse: 92 88  Resp: 12 13  Temp:    SpO2: 96% 97%    Last Pain:  Vitals:   12/11/17 1755  TempSrc:   PainSc: Asleep    LLE Motor Response: Purposeful movement (12/11/17 1755) LLE Sensation: Full sensation (12/11/17 1755) RLE Motor Response: Purposeful movement (12/11/17 1755) RLE Sensation: Full sensation (12/11/17 1755)      Markeda Narvaez

## 2017-12-11 NOTE — Progress Notes (Signed)
PHARMACY - PHYSICIAN COMMUNICATION CRITICAL VALUE ALERT - BLOOD CULTURE IDENTIFICATION (BCID)  Tyrone Roman is an 72 y.o. male who presented to [facility] on 12/10/2017 with a chief complaint of sepsis.   Assessment:  Pt was recently dc from here after a discectomy. He came back with sepsis. Labs called this AM with the BCID results. He is now positive for 4/4 bottles with MSSA. Was going to ask Dr.Rizwan to de-escalate to Ancef but he has a cephalexin allergy. Cont vanc for now and let ID decides once pt is seen.  Name of physician (or Provider) Contacted: Dr. Wynelle Cleveland  Current antibiotics: vanc/aztreona/flagyl  Changes to prescribed antibiotics recommended:  Dc aztreonam/flagyl Continue vanc  Results for orders placed or performed during the hospital encounter of 12/10/17  Blood Culture ID Panel (Reflexed) (Collected: 12/10/2017  2:43 PM)  Result Value Ref Range   Enterococcus species NOT DETECTED NOT DETECTED   Listeria monocytogenes NOT DETECTED NOT DETECTED   Staphylococcus species DETECTED (A) NOT DETECTED   Staphylococcus aureus (BCID) DETECTED (A) NOT DETECTED   Methicillin resistance NOT DETECTED NOT DETECTED   Streptococcus species NOT DETECTED NOT DETECTED   Streptococcus agalactiae NOT DETECTED NOT DETECTED   Streptococcus pneumoniae NOT DETECTED NOT DETECTED   Streptococcus pyogenes NOT DETECTED NOT DETECTED   Acinetobacter baumannii NOT DETECTED NOT DETECTED   Enterobacteriaceae species NOT DETECTED NOT DETECTED   Enterobacter cloacae complex NOT DETECTED NOT DETECTED   Escherichia coli NOT DETECTED NOT DETECTED   Klebsiella oxytoca NOT DETECTED NOT DETECTED   Klebsiella pneumoniae NOT DETECTED NOT DETECTED   Proteus species NOT DETECTED NOT DETECTED   Serratia marcescens NOT DETECTED NOT DETECTED   Haemophilus influenzae NOT DETECTED NOT DETECTED   Neisseria meningitidis NOT DETECTED NOT DETECTED   Pseudomonas aeruginosa NOT DETECTED NOT DETECTED   Candida albicans  NOT DETECTED NOT DETECTED   Candida glabrata NOT DETECTED NOT DETECTED   Candida krusei NOT DETECTED NOT DETECTED   Candida parapsilosis NOT DETECTED NOT DETECTED   Candida tropicalis NOT DETECTED NOT DETECTED   Onnie Boer, PharmD, BCIDP, AAHIVP, CPP Infectious Disease Pharmacist 12/11/2017 9:06 AM

## 2017-12-11 NOTE — Progress Notes (Addendum)
PROGRESS NOTE    Tyrone Roman   XBM:[id number]  DOB: March 31, 1945  DOA: 12/10/2017 PCP: [name], MD   Brief Narrative:  Tyrone Roman  is a 72 y.o. male with medical history significant for RA not currently on meds, lumbar microdiscetcomy 12/06/17 by Dr. Kathyrn Sheriff, who presented to the ED  with c/o progressive back pain 5/10 at rest and 10/10 with movement, AMS, increased weakness (reportedly of his LLE but poor historian). No fevers. Called after hours nurse Sat due to increased pain, was told to increase oxycodone to q2h from q4h. He has a friend who stays with him who states he became more lethargic after this, and stopped eating and drinking almost entirely since Sat. No oxy has been given since Sat afternoon. Friend reports that pt does not drink alcohol hardly ever; no h/o illicit drugs or smoking.   Subjective: Called by ID PA as patient complains of blurred vision for a few hrs and slurred speech for about 30-40 min. He appears mildly sleepy on exam. Able to answer questions- see full exam below. Pain is 5/10.    Assessment & Plan:   Principal Problem:   Severe Sepsis , MSSA bacteremia s/p lumbar wound infection - ID consulted and managing antibiotics - LA elevated at high at 4.28 and still 3.9 - recheck tomorrow    HNP (herniated nucleus pulposus), lumbar with incontinence/ cauda equina after a lifting accident (failed outpt treatment) - s/p s/p microdiscectomy 5d ago - now has infected wound with purluent drainage - OR this afternoon per NS - hold Aspirin - cannot use Toradol of NSAIDs for pain in setting of AKI - on Hydrocodone PRN - discussed plan with ID    AKI (acute kidney injury) (HC - Cr 2.27- baseline ~ 0.9-1.0 - improved to 1.71 - on NS 150 cc/hr, hold Naproxen- follow I and O  ? Blurred and double vision today - also confused and therefore unreliable historian - f/u CT head Addendum: CT head negative for CVA  Acute metabolic encephalopathy - due  to narcotics in setting of AKI and due to underlying infection - able to give most history accurately     Hyperlipidemia - simvastatin   Rheumatoid arthritis - Follows with Dr Trudie Reed - appears that he was on Cimza injections in the past but not on anything recently  Coronary artery disease of native artery of native heart with stable angina pectoris   - cont statin- ASA on hold  HTN - cont Toprol as BP is elevated - hold IMdur       GERD - on Prilosec at home- cont PPI    COPD (chronic obstructive pulmonary disease) - stable    Constipation due to pain medication - had large BM overnight - states he did not take any laxatives at home  DVT prophylaxis: sCDs Code Status: Full code Family Communication: spoke with friend at bedside- Voula Boutis Disposition Plan: to OR today Consultants:   NS  ID Procedures:   none Antimicrobials:  Anti-infectives (From admission, onward)   Start     Dose/Rate Route Frequency Ordered Stop   12/11/17 1500  vancomycin (VANCOCIN) 1,250 mg in sodium chloride 0.9 % 250 mL IVPB     1,250 mg 166.7 mL/hr over 90 Minutes Intravenous Every 24 hours 12/10/17 1558     12/10/17 2300  aztreonam (AZACTAM) 1 g in sodium chloride 0.9 % 100 mL IVPB  Status:  Discontinued     1 g 200 mL/hr over  30 Minutes Intravenous Every 8 hours 12/10/17 1558 12/11/17 0805   12/10/17 1515  vancomycin (VANCOCIN) 2,000 mg in sodium chloride 0.9 % 500 mL IVPB     2,000 mg 250 mL/hr over 120 Minutes Intravenous  Once 12/10/17 1500 12/10/17 1832   12/10/17 1445  aztreonam (AZACTAM) 2 g in sodium chloride 0.9 % 100 mL IVPB     2 g 200 mL/hr over 30 Minutes Intravenous  Once 12/10/17 1443 12/10/17 1558   12/10/17 1445  metroNIDAZOLE (FLAGYL) IVPB 500 mg  Status:  Discontinued     500 mg 100 mL/hr over 60 Minutes Intravenous Every 8 hours 12/10/17 1443 12/11/17 0902   12/10/17 1445  vancomycin (VANCOCIN) IVPB 1000 mg/200 mL premix  Status:  Discontinued     1,000  mg 200 mL/hr over 60 Minutes Intravenous  Once 12/10/17 1443 12/10/17 1500       Objective: Vitals:   12/11/17 0000 12/11/17 0200 12/11/17 0752 12/11/17 0842  BP: (!) 144/103 112/86 (!) 142/96 (!) 134/101  Pulse: (!) 116 (!) 128 (!) 107 (!) 105  Resp:  (!) 22 20   Temp:   98.1 F (36.7 C)   TempSrc:   Oral   SpO2:  96% 93%   Weight:      Height:        Intake/Output Summary (Last 24 hours) at 12/11/2017 1126 Last data filed at 12/11/2017 0800 Gross per 24 hour  Intake 2500 ml  Output 850 ml  Net 1650 ml   Filed Weights   12/10/17 1401  Weight: 105.7 kg    Examination: General exam: Appears comfortable- sleepy but awake  HEENT: PERRLA, oral mucosa moist, no sclera icterus or thrush Respiratory system: Clear to auscultation. Respiratory effort normal. Cardiovascular system: S1 & S2 heard, RRR.   Gastrointestinal system: Abdomen soft,  Mild diffuse tenderness, nondistended. Normal bowel sounds. Central nervous system: Alert and partly oriented. Slow deliberate speech. Knows the month but not the exact date. Recognized friend at bedside. States vision is blurred and sees 2 of my finger in all visual fields. Moving all extremities. Extremities: No cyanosis, clubbing or edema Skin: No rashes or ulcers    Data Reviewed: I have personally reviewed following labs and imaging studies  CBC: Recent Labs  Lab 12/06/17 1237 12/10/17 1418 12/11/17 0500  WBC 4.6 5.2 4.8  NEUTROABS  --  3.5  --   HGB 13.8 16.2 13.4  HCT 42.4 49.6 41.3  MCV 90.2 89.4 87.1  PLT 138* 150 99*   Basic Metabolic Panel: Recent Labs  Lab 12/06/17 1237 12/10/17 1418 12/11/17 0500  NA 140 135 139  K 3.8 4.3 3.9  CL 112* 99 110  CO2 25 24 22   GLUCOSE 112* 172* 152*  BUN 21 48* 37*  CREATININE 1.05 2.27* 1.71*  CALCIUM 9.7 10.5* 9.3   GFR: Estimated Creatinine Clearance: 50.6 mL/min (A) (by C-G formula based on SCr of 1.71 mg/dL (H)). Liver Function Tests: Recent Labs  Lab 12/10/17 1418   AST 58*  ALT 32  ALKPHOS 48  BILITOT 1.4*  PROT 7.0  ALBUMIN 2.9*   No results for input(s): LIPASE, AMYLASE in the last 168 hours. Recent Labs  Lab 12/10/17 1832  AMMONIA 20   Coagulation Profile: No results for input(s): INR, PROTIME in the last 168 hours. Cardiac Enzymes: No results for input(s): CKTOTAL, CKMB, CKMBINDEX, TROPONINI in the last 168 hours. BNP (last 3 results) No results for input(s): PROBNP in the last 8760 hours. HbA1C:  No results for input(s): HGBA1C in the last 72 hours. CBG: Recent Labs  Lab 12/10/17 1416  GLUCAP 174*   Lipid Profile: No results for input(s): CHOL, HDL, LDLCALC, TRIG, CHOLHDL, LDLDIRECT in the last 72 hours. Thyroid Function Tests: No results for input(s): TSH, T4TOTAL, FREET4, T3FREE, THYROIDAB in the last 72 hours. Anemia Panel: No results for input(s): VITAMINB12, FOLATE, FERRITIN, TIBC, IRON, RETICCTPCT in the last 72 hours. Urine analysis:    Component Value Date/Time   COLORURINE YELLOW 12/10/2017 1412   APPEARANCEUR HAZY (A) 12/10/2017 1412   LABSPEC 1.023 12/10/2017 1412   PHURINE 5.0 12/10/2017 1412   GLUCOSEU NEGATIVE 12/10/2017 1412   HGBUR MODERATE (A) 12/10/2017 1412   HGBUR negative 01/26/2010 0852   BILIRUBINUR NEGATIVE 12/10/2017 1412   BILIRUBINUR N 12/05/2016 1045   KETONESUR NEGATIVE 12/10/2017 1412   PROTEINUR 100 (A) 12/10/2017 1412   UROBILINOGEN 0.2 12/05/2016 1045   UROBILINOGEN 0.2 01/26/2010 0852   NITRITE NEGATIVE 12/10/2017 1412   LEUKOCYTESUR NEGATIVE 12/10/2017 1412   Sepsis Labs: @LABRCNTIP (procalcitonin:4,lacticidven:4) ) Recent Results (from the past 240 hour(s))  Blood Culture (routine x 2)     Status: None (Preliminary result)   Collection Time: 12/10/17  2:43 PM  Result Value Ref Range Status   Specimen Description BLOOD RIGHT ANTECUBITAL  Final   Special Requests   Final    BOTTLES DRAWN AEROBIC AND ANAEROBIC Blood Culture adequate volume   Culture  Setup Time   Final    GRAM  POSITIVE COCCI IN BOTH AEROBIC AND ANAEROBIC BOTTLES CRITICAL RESULT CALLED TO, READ BACK BY AND VERIFIED WITH: M. PHAM, PHARMD AT 0845 ON 12/11/17 BY C. JESSUP, MLT. Performed at [facility], [place] [address], [place], [place] [address]    Culture GRAM POSITIVE COCCI  Final   Report Status PENDING  Incomplete  Blood Culture ID Panel (Reflexed)     Status: Abnormal   Collection Time: 12/10/17  2:43 PM  Result Value Ref Range Status   Enterococcus species NOT DETECTED NOT DETECTED Final   Listeria monocytogenes NOT DETECTED NOT DETECTED Final   Staphylococcus species DETECTED (A) NOT DETECTED Final    Comment: CRITICAL RESULT CALLED TO, READ BACK BY AND VERIFIED WITH: M. PHAM, PHARMD AT 0845 ON 12/11/17 BY C. JESSUP, MLT.    Staphylococcus aureus (BCID) DETECTED (A) NOT DETECTED Final    Comment: Methicillin (oxacillin) susceptible Staphylococcus aureus (MSSA). Preferred therapy is anti staphylococcal beta lactam antibiotic (Cefazolin or Nafcillin), unless clinically contraindicated. CRITICAL RESULT CALLED TO, READ BACK BY AND VERIFIED WITH: M. PHAM, PHARMD AT 0845 ON 12/11/17 BY C. JESSUP, MLT.    Methicillin resistance NOT DETECTED NOT DETECTED Final   Streptococcus species NOT DETECTED NOT DETECTED Final   Streptococcus agalactiae NOT DETECTED NOT DETECTED Final   Streptococcus pneumoniae NOT DETECTED NOT DETECTED Final   Streptococcus pyogenes NOT DETECTED NOT DETECTED Final   Acinetobacter baumannii NOT DETECTED NOT DETECTED Final   Enterobacteriaceae species NOT DETECTED NOT DETECTED Final   Enterobacter cloacae complex NOT DETECTED NOT DETECTED Final   Escherichia coli NOT DETECTED NOT DETECTED Final   Klebsiella oxytoca NOT DETECTED NOT DETECTED Final   Klebsiella pneumoniae NOT DETECTED NOT DETECTED Final   Proteus species NOT DETECTED NOT DETECTED Final   Serratia marcescens NOT DETECTED NOT DETECTED Final   Haemophilus influenzae NOT DETECTED NOT DETECTED Final    Neisseria meningitidis NOT DETECTED NOT DETECTED Final   Pseudomonas aeruginosa NOT DETECTED NOT DETECTED Final   Candida albicans  NOT DETECTED NOT DETECTED Final   Candida glabrata NOT DETECTED NOT DETECTED Final   Candida krusei NOT DETECTED NOT DETECTED Final   Candida parapsilosis NOT DETECTED NOT DETECTED Final   Candida tropicalis NOT DETECTED NOT DETECTED Final    Comment: Performed at [facility], [place] [address], [place], [place] [address]  Blood Culture (routine x 2)     Status: None (Preliminary result)   Collection Time: 12/10/17  3:30 PM  Result Value Ref Range Status   Specimen Description BLOOD RIGHT ARM  Final   Special Requests   Final    BOTTLES DRAWN AEROBIC AND ANAEROBIC Blood Culture adequate volume   Culture  Setup Time   Final    GRAM POSITIVE COCCI IN BOTH AEROBIC AND ANAEROBIC BOTTLES CRITICAL RESULT CALLED TO, READ BACK BY AND VERIFIED WITH: M. PHAM, PHARMD, AT 0845 ON 12/11/17 BY C. JESSUP, MLT. Performed at [facility], [place] [address], [place], [place] [address]    Culture GRAM POSITIVE COCCI  Final   Report Status PENDING  Incomplete  MRSA PCR Screening     Status: None   Collection Time: 12/10/17  9:26 PM  Result Value Ref Range Status   MRSA by PCR NEGATIVE NEGATIVE Final    Comment:        The GeneXpert MRSA Assay (FDA approved for NASAL specimens only), is one component of a comprehensive MRSA colonization surveillance program. It is not intended to diagnose MRSA infection nor to guide or monitor treatment for MRSA infections. Performed at [facility], [place] [address] [place], [place] [address]          Radiology Studies: Dg Chest Port 1 View  Result Date: 12/10/2017 CLINICAL DATA:  Altered mental status. Status post lumbar laminectomy and decompression 12/06/2017. EXAM: PORTABLE CHEST 1 VIEW COMPARISON:  PA and lateral chest 09/07/2017.  CT chest 11/27/2010. FINDINGS: The lungs are emphysematous but clear. Heart size  is normal. No pneumothorax or pleural effusion. No acute or focal bony abnormality. IMPRESSION: Emphysema without acute disease. Electronically Signed   By: Inge Rise M.D.   On: 12/10/2017 15:30      Scheduled Meds: . heparin  5,000 Units Subcutaneous Q8H  . metoprolol succinate  25 mg Oral Daily  . metoprolol tartrate  5 mg Intravenous Once  . multivitamin with minerals  1 tablet Oral Daily  . pantoprazole  40 mg Oral Daily  . simvastatin  40 mg Oral QPM  . tamsulosin  0.4 mg Oral QPM   Continuous Infusions: . sodium chloride 150 mL/hr at 12/10/17 2125  . vancomycin       LOS: 1 day    Time spent in minutes: 45 min- > 50% of time > reviewed prior chart, spoke with patient twice and his friend separately, d/w ID    Debbe Odea, MD [facility] Pager: [email or web address] Password [email or web address] 12/11/2017, 11:26 AM

## 2017-12-11 NOTE — Progress Notes (Signed)
[name] has eaten last at approximately 8:30a (~6hrs ago). Although ideally we would prefer to wait for 8 hrs since the patient last ate, given his relatively rapid clinical decline over the last several days I feel proceeding with surgical wound debridement and washout on an emergent basis is reasonable. I have discussed this with the attending anesthesiologist.

## 2017-12-11 NOTE — Transfer of Care (Signed)
Immediate Anesthesia Transfer of Care Note  Patient: Tyrone Roman  Procedure(s) Performed: LUMBAR WOUND DEBRIDEMENT (N/A )  Patient Location: PACU  Anesthesia Type:General  Level of Consciousness: awake, alert , oriented and patient cooperative  Airway & Oxygen Therapy: Patient Spontanous Breathing and Patient connected to nasal cannula oxygen  Post-op Assessment: Report given to RN and Post -op Vital signs reviewed and stable  Post vital signs: Reviewed and stable  Last Vitals:  Vitals Value Taken Time  BP 166/103 12/11/2017  4:54 PM  Temp 37 C 12/11/2017  4:54 PM  Pulse 103 12/11/2017  4:59 PM  Resp 16 12/11/2017  4:59 PM  SpO2 97 % 12/11/2017  4:59 PM  Vitals shown include unvalidated device data.  Last Pain:  Vitals:   12/11/17 1200  TempSrc:   PainSc: 7          Complications: No apparent anesthesia complications

## 2017-12-11 NOTE — Progress Notes (Signed)
Handoff completed

## 2017-12-11 NOTE — Progress Notes (Signed)
PACU nurse just called to give report, RN advised to expect patient's arrival shortly.

## 2017-12-11 NOTE — Plan of Care (Signed)

## 2017-12-12 ENCOUNTER — Encounter (HOSPITAL_COMMUNITY): Payer: Self-pay | Admitting: Neurosurgery

## 2017-12-12 ENCOUNTER — Other Ambulatory Visit: Payer: Self-pay

## 2017-12-12 ENCOUNTER — Inpatient Hospital Stay (HOSPITAL_COMMUNITY): Payer: Medicare Other

## 2017-12-12 ENCOUNTER — Other Ambulatory Visit (HOSPITAL_COMMUNITY): Payer: Medicare Other

## 2017-12-12 DIAGNOSIS — B9561 Methicillin susceptible Staphylococcus aureus infection as the cause of diseases classified elsewhere: Secondary | ICD-10-CM | POA: Diagnosis present

## 2017-12-12 DIAGNOSIS — R4182 Altered mental status, unspecified: Secondary | ICD-10-CM

## 2017-12-12 DIAGNOSIS — T8149XD Infection following a procedure, other surgical site, subsequent encounter: Secondary | ICD-10-CM

## 2017-12-12 DIAGNOSIS — Z978 Presence of other specified devices: Secondary | ICD-10-CM

## 2017-12-12 DIAGNOSIS — R7881 Bacteremia: Secondary | ICD-10-CM | POA: Diagnosis present

## 2017-12-12 DIAGNOSIS — N179 Acute kidney failure, unspecified: Secondary | ICD-10-CM

## 2017-12-12 LAB — BASIC METABOLIC PANEL
Anion gap: 5 (ref 5–15)
BUN: 31 mg/dL — ABNORMAL HIGH (ref 8–23)
CO2: 24 mmol/L (ref 22–32)
Calcium: 9.3 mg/dL (ref 8.9–10.3)
Chloride: 110 mmol/L (ref 98–111)
Creatinine, Ser: 1.38 mg/dL — ABNORMAL HIGH (ref 0.61–1.24)
GFR calc Af Amer: 57 mL/min — ABNORMAL LOW (ref >60)
GFR calc non Af Amer: 50 mL/min — ABNORMAL LOW (ref >60)
Glucose, Bld: 124 mg/dL — ABNORMAL HIGH (ref 70–99)
Potassium: 3.8 mmol/L (ref 3.5–5.1)
Sodium: 139 mmol/L (ref 135–145)

## 2017-12-12 LAB — CBC
HCT: 36.7 % — ABNORMAL LOW (ref 39.0–52.0)
Hemoglobin: 11.9 g/dL — ABNORMAL LOW (ref 13.0–17.0)
MCH: 28.9 pg (ref 26.0–34.0)
MCHC: 32.4 g/dL (ref 30.0–36.0)
MCV: 89.1 fL (ref 80.0–100.0)
Platelets: 99 10*3/uL — ABNORMAL LOW (ref 150–400)
RBC: 4.12 MIL/uL — ABNORMAL LOW (ref 4.22–5.81)
RDW: 13.7 % (ref 11.5–15.5)
WBC: 6 10*3/uL (ref 4.0–10.5)
nRBC: 0 % (ref 0.0–0.2)

## 2017-12-12 LAB — VITAMIN B12: Vitamin B-12: 751 pg/mL (ref 180–914)

## 2017-12-12 LAB — LACTIC ACID, PLASMA: Lactic Acid, Venous: 1 mmol/L (ref 0.5–1.9)

## 2017-12-12 MED ORDER — SODIUM CHLORIDE 0.9 % IV SOLN
2.0000 g | INTRAVENOUS | Status: DC
  Administered 2017-12-12 – 2017-12-17 (×30): 2 g via INTRAVENOUS
  Filled 2017-12-12 (×36): qty 2000

## 2017-12-12 MED ORDER — HALOPERIDOL LACTATE 5 MG/ML IJ SOLN
INTRAMUSCULAR | Status: AC
  Filled 2017-12-12: qty 1

## 2017-12-12 MED ORDER — HALOPERIDOL LACTATE 5 MG/ML IJ SOLN
5.0000 mg | Freq: Once | INTRAMUSCULAR | Status: AC
  Administered 2017-12-12: 5 mg via INTRAVENOUS
  Filled 2017-12-12: qty 1

## 2017-12-12 NOTE — Op Note (Signed)
  NEUROSURGERY OPERATIVE NOTE   PREOP DIAGNOSIS:  1. Wound infection   POSTOP DIAGNOSIS: Same  PROCEDURE: 1. Debridement, washout and closure of lumbar wound  SURGEON: Dr. Consuella Lose, MD  ASSISTANT: None  ANESTHESIA: General Endotracheal  EBL: 50cc  SPECIMENS: Lumbar wound swab for culture  DRAINS: 54F Hemovac  COMPLICATIONS: None immediate  CONDITION: Hemodynamically stable to PACU  HISTORY: [name] is a 72 y.o. male who underwent repeat lumbar laminectomy and microdiscectomy for a recurrent L1 to disc herniation approximately 5 days prior.  He was discharged home the same day.  Per his girlfriend, he has been lethargic, and somewhat confused over the weekend.  He was brought to the emergency department where he was found to have acutely increased creatinine, and this morning was found to have significant purulent drainage from his wound.  He is therefore brought to the operating room for washout and debridement of his wound.    PROCEDURE IN DETAIL: The patient was brought to the operating room. After induction of general anesthesia, the patient was positioned on the operative table in the prone position. All pressure points were meticulously padded. Skin incision was then marked out and prepped and draped in the usual sterile fashion.  After timeout was conducted, the previous skin incision was opened sharply.  Immediately underneath I noted a fairly large subcutaneous pocket filled with purulent material.  Culture swabs were taken and sent for aerobic and anaerobic cultures.  Fascial stitches appeared to be completely dehiscent, with infected purulent material tracking down along the lamina and the laminotomy defect.  The wound was suctioned of any purulent material, and debrided using scissors of any devitalized tissue.  I was then able to identify what appeared to be good healthy subcutaneous and muscular tissue with some bleeding.  I then irrigated the wound with  approximately 3 L of normal saline irrigation.  A 7 French hemogenic VAC drain was then placed over the laminotomy and a loop was placed in the subcutaneous pocket.  The Hemovac was then tunneled subcutaneously.  The wound was then closed in multiple layers with interrupted 0 Vicryl stitches.  Skin was closed with a running vertical mattress 0 Prolene stitch.  Bacitracin ointment and sterile dressings were applied after the drain was secured with a stitch.  At the end of the case all sponge, needle, instrument, and cottonoid counts were correct.  The patient was then transferred to the stretcher, extubated, and taken to the postanesthesia care unit in stable hemodynamic condition.

## 2017-12-12 NOTE — Progress Notes (Signed)
[facility] for Infectious Disease  Date of Admission:  12/10/2017   Total days of antibiotics 2        Day 1 nafcillin           Patient ID: Tyrone Roman is a 72 y.o. male with  Principal Problem:   MSSA bacteremia Active Problems:   Hyperlipidemia   Coronary artery disease of native artery of native heart with stable angina pectoris (HCC)   GERD   COPD (chronic obstructive pulmonary disease) (HCC)   Rheumatoid arthritis (HCC)   HNP (herniated nucleus pulposus), lumbar   Altered mental state   AKI (acute kidney injury) ([place])   Sepsis ([place])   Constipation due to pain medication   . heparin  5,000 Units Subcutaneous Q8H  . metoprolol succinate  25 mg Oral Daily  . metoprolol tartrate  5 mg Intravenous Once  . multivitamin with minerals  1 tablet Oral Daily  . pantoprazole  40 mg Oral Daily  . simvastatin  40 mg Oral QPM  . sodium chloride flush  3 mL Intravenous Q12H  . tamsulosin  0.4 mg Oral QPM    SUBJECTIVE: Less confused today. "Pain is dulled down some." Wants to know when he can go home.   Allergies  Allergen Reactions  . Cephalexin Shortness Of Breath, Rash and Other (See Comments)    Tolerated Augmentin 2015 and 2017 (12-2017). PATIENT HAS HAD A PCN REACTION WITH IMMEDIATE RASH, FACIAL/TONGUE/THROAT SWELLING, SOB, OR LIGHTHEADEDNESS WITH HYPOTENSION:  #  #  YES  #  #  Has patient had a PCN reaction causing severe rash involving mucus membranes or skin necrosis: No Has patient had a PCN reaction that required hospitalization: No Has patient had a PCN reaction occurring within the last 10 years: No If all of the above answers are "NO", then may proceed with Cephalosporin use.   . Clarithromycin Shortness Of Breath and Rash  . Certolizumab Pegol Hives  . Doxycycline Rash  . Hydroxyzine Rash  . Lidoderm Other (See Comments)    "made me act weird"  . Pyrithione Zinc Rash    OBJECTIVE: Vitals:   12/11/17 2320 12/11/17 2342 12/12/17 0800  12/12/17 0932  BP: (!) 157/109 (!) 158/87 (!) 132/93 (!) 144/78  Pulse: 96 99 99 90  Resp: 16 17 12    Temp:  99.8 F (37.7 C) 97.6 F (36.4 C)   TempSrc:  Axillary Oral   SpO2: 97% 90% 91%   Weight:      Height:       Body mass index is 29.92 kg/m.  Physical Exam  Constitutional: He is oriented to person, place, and time. He appears well-developed and well-nourished.  Resting in bed. Shifting around and uncomfortable.   Eyes: Pupils are equal, round, and reactive to light. No scleral icterus.  Cardiovascular: Normal rate and regular rhythm.  No murmur heard. Pulmonary/Chest: Effort normal and breath sounds normal. No respiratory distress.  Abdominal: Soft. He exhibits no distension.  Musculoskeletal: He exhibits no edema.  Lower back pain. Hemovac in place with bloody drainage.   Neurological: He is alert and oriented to person, place, and time.  Skin: Skin is warm and dry. No rash noted.  Psychiatric: He has a normal mood and affect. Judgment normal.    Lab Results Lab Results  Component Value Date   WBC 6.0 12/12/2017   HGB 11.9 (L) 12/12/2017   HCT 36.7 (L) 12/12/2017   MCV 89.1 12/12/2017  PLT 99 (L) 12/12/2017    Lab Results  Component Value Date   CREATININE 1.38 (H) 12/12/2017   BUN 31 (H) 12/12/2017   NA 139 12/12/2017   K 3.8 12/12/2017   CL 110 12/12/2017   CO2 24 12/12/2017    Lab Results  Component Value Date   ALT 32 12/10/2017   AST 58 (H) 12/10/2017   ALKPHOS 48 12/10/2017   BILITOT 1.4 (H) 12/10/2017     Microbiology: Recent Results (from the past 240 hour(s))  Blood Culture (routine x 2)     Status: Abnormal (Preliminary result)   Collection Time: 12/10/17  2:43 PM  Result Value Ref Range Status   Specimen Description BLOOD RIGHT ANTECUBITAL  Final   Special Requests   Final    BOTTLES DRAWN AEROBIC AND ANAEROBIC Blood Culture adequate volume   Culture  Setup Time   Final    GRAM POSITIVE COCCI IN BOTH AEROBIC AND ANAEROBIC  BOTTLES CRITICAL RESULT CALLED TO, READ BACK BY AND VERIFIED WITH: M. PHAM, PHARMD AT 0845 ON 12/11/17 BY C. JESSUP, MLT. Performed at [facility], [place] [address] [place], [place] [address]    Culture STAPHYLOCOCCUS AUREUS (A)  Final   Report Status PENDING  Incomplete  Blood Culture ID Panel (Reflexed)     Status: Abnormal   Collection Time: 12/10/17  2:43 PM  Result Value Ref Range Status   Enterococcus species NOT DETECTED NOT DETECTED Final   Listeria monocytogenes NOT DETECTED NOT DETECTED Final   Staphylococcus species DETECTED (A) NOT DETECTED Final    Comment: CRITICAL RESULT CALLED TO, READ BACK BY AND VERIFIED WITH: M. PHAM, PHARMD AT 0845 ON 12/11/17 BY C. JESSUP, MLT.    Staphylococcus aureus (BCID) DETECTED (A) NOT DETECTED Final    Comment: Methicillin (oxacillin) susceptible Staphylococcus aureus (MSSA). Preferred therapy is anti staphylococcal beta lactam antibiotic (Cefazolin or Nafcillin), unless clinically contraindicated. CRITICAL RESULT CALLED TO, READ BACK BY AND VERIFIED WITH: M. PHAM, PHARMD AT 0845 ON 12/11/17 BY C. JESSUP, MLT.    Methicillin resistance NOT DETECTED NOT DETECTED Final   Streptococcus species NOT DETECTED NOT DETECTED Final   Streptococcus agalactiae NOT DETECTED NOT DETECTED Final   Streptococcus pneumoniae NOT DETECTED NOT DETECTED Final   Streptococcus pyogenes NOT DETECTED NOT DETECTED Final   Acinetobacter baumannii NOT DETECTED NOT DETECTED Final   Enterobacteriaceae species NOT DETECTED NOT DETECTED Final   Enterobacter cloacae complex NOT DETECTED NOT DETECTED Final   Escherichia coli NOT DETECTED NOT DETECTED Final   Klebsiella oxytoca NOT DETECTED NOT DETECTED Final   Klebsiella pneumoniae NOT DETECTED NOT DETECTED Final   Proteus species NOT DETECTED NOT DETECTED Final   Serratia marcescens NOT DETECTED NOT DETECTED Final   Haemophilus influenzae NOT DETECTED NOT DETECTED Final   Neisseria meningitidis NOT DETECTED NOT  DETECTED Final   Pseudomonas aeruginosa NOT DETECTED NOT DETECTED Final   Candida albicans NOT DETECTED NOT DETECTED Final   Candida glabrata NOT DETECTED NOT DETECTED Final   Candida krusei NOT DETECTED NOT DETECTED Final   Candida parapsilosis NOT DETECTED NOT DETECTED Final   Candida tropicalis NOT DETECTED NOT DETECTED Final    Comment: Performed at [facility], [place] [address] [place], [place] [address]  Blood Culture (routine x 2)     Status: Abnormal (Preliminary result)   Collection Time: 12/10/17  3:30 PM  Result Value Ref Range Status   Specimen Description BLOOD RIGHT ARM  Final   Special Requests  Final    BOTTLES DRAWN AEROBIC AND ANAEROBIC Blood Culture adequate volume   Culture  Setup Time   Final    GRAM POSITIVE COCCI IN BOTH AEROBIC AND ANAEROBIC BOTTLES CRITICAL RESULT CALLED TO, READ BACK BY AND VERIFIED WITH: M. PHAM, PHARMD, AT 0845 ON 12/11/17 BY C. JESSUP, MLT. Performed at [facility], [place] [address] [place], [place] [address]    Culture STAPHYLOCOCCUS AUREUS (A)  Final   Report Status PENDING  Incomplete  MRSA PCR Screening     Status: None   Collection Time: 12/10/17  9:26 PM  Result Value Ref Range Status   MRSA by PCR NEGATIVE NEGATIVE Final    Comment:        The GeneXpert MRSA Assay (FDA approved for NASAL specimens only), is one component of a comprehensive MRSA colonization surveillance program. It is not intended to diagnose MRSA infection nor to guide or monitor treatment for MRSA infections. Performed at [facility], [place] [address], [place], [place] [address]   Aerobic Culture (superficial specimen)     Status: None (Preliminary result)   Collection Time: 12/11/17 10:00 AM  Result Value Ref Range Status   Specimen Description WOUND BACK  Final   Special Requests NONE  Final   Gram Stain   Final    RARE WBC PRESENT, PREDOMINANTLY PMN FEW GRAM POSITIVE COCCI Performed at [facility], [place] [address]  [place], [place] [address]    Culture PENDING  Incomplete   Report Status PENDING  Incomplete  Aerobic/Anaerobic Culture (surgical/deep wound)     Status: None (Preliminary result)   Collection Time: 12/11/17  4:09 PM  Result Value Ref Range Status   Specimen Description WOUND  Final   Special Requests NONE  Final   Gram Stain   Final    FEW WBC PRESENT, PREDOMINANTLY PMN FEW GRAM POSITIVE COCCI IN PAIRS IN CLUSTERS Performed at [facility], [place] [address], [place], [place] [address]    Culture PENDING  Incomplete   Report Status PENDING  Incomplete  Aerobic/Anaerobic Culture (surgical/deep wound)     Status: None (Preliminary result)   Collection Time: 12/11/17  4:09 PM  Result Value Ref Range Status   Specimen Description WOUND  Final   Special Requests NONE  Final   Gram Stain   Final    FEW WBC PRESENT, PREDOMINANTLY PMN FEW GRAM POSITIVE COCCI IN PAIRS IN CLUSTERS Performed at [facility], [place] [address], [place], [place] [address]    Culture PENDING  Incomplete   Report Status PENDING  Incomplete   ASSESSMENT & PLAN:   1. MSSA Bacteremia = BCx positive 11/05 2/2 sets. He tolerated augmentin on 2 occasions outpatient between 2015 - 2017; will stop vancomycin and change to nafcillin to treat infection. Repeated blood cultures are pending as of this AM. Hold on PICC for now. Still needs a TTE (ordered today).    2. Lumbar Spine Wound Infection = POD 1 following debridement. No op note up for review but intraoperative cultures with gram positive cocci's in pairs/clusters c/w staph aureus.   3. AKI = improving  4. Medication Monitoring = will need to monitor cbc and creatinine on nafcillin  5. Discharge Planning = he lives at home but has a close friend that has been staying with him since surgery that he identifies can help with abx administration at home. Will need case management consult for home IV therapy (ordered).  Janene Madeira, MSN, NP-C [facility]  for Infectious [place] Cell: [phone] Pager: [phone]  12/12/2017  10:12 AM

## 2017-12-12 NOTE — Evaluation (Signed)
Occupational Therapy Evaluation Patient Details Name: Tyrone Roman MRN: [id number] DOB: January 11, 1946 Today's Date: 12/12/2017    History of Present Illness 72 y.o. male admitted on 12/10/17 for AMS.  Found to have sepsis due to back wound infection.  S/p I and D of lumbar wound on 12/11/17.  Pt's blood cultures (+) for MSSA bacteremia.  Pt with continued AMS post op and neuro consulted.  CT (normal) and MRI are pending, but they are suspicious of narcotic reaction.  Pt with significant PMH of R knee arthroscopy (multiple), RA, MI, HTN dysphagia, COPD, CAD, concussion, lumbar microdiscectomy 10/07/17.     Clinical Impression   PTA, pt was living alone and was independent; pt reports his girlfriend and daughters plan to stay at dc. Pt currently requiring Min A for UB ADLs, Mod A +2 for LB ADLs, and Mod A +2 for functional mobility. Pt presenting with decreased strength, balance, and activity tolerance,. Pt would benefit from further acute OT to facilitate safe dc. Recommend dc to home with HHOT for further OT to optimize safety, independence with ADLs, and return to PLOF.      Follow Up Recommendations  Home health OT;Supervision/Assistance - 24 hour    Equipment Recommendations  None recommended by OT    Recommendations for Other Services PT consult     Precautions / Restrictions Precautions Precautions: Back Precaution Comments: Reviewed back precautions Required Braces or Orthoses: Other Brace/Splint Other Brace/Splint: hemo vac      Mobility Bed Mobility Overal bed mobility: Needs Assistance Bed Mobility: Rolling;Sidelying to Sit;Sit to Sidelying Rolling: Min assist Sidelying to sit: Min assist     Sit to sidelying: Min assist General bed mobility comments: Min assist to roll to side, min assist to support trunk to get to sitting EOB, min assist to lift both legs back up into bed to return to supine/sidelying.  Verbal cues for safe log roll and reverse log roll technique.    Transfers Overall transfer level: Needs assistance Equipment used: 2 person hand held assist Transfers: Sit to/from Stand Sit to Stand: Mod assist         General transfer comment: Mod assist to support trunk over flexed knees to come to standing with left knee having a buckling moment.      Balance Overall balance assessment: Needs assistance Sitting-balance support: Feet supported;Bilateral upper extremity supported Sitting balance-Leahy Scale: Poor Sitting balance - Comments: need min to mod assist to support trunk in sitting while practicing figure 4 crossing of legs to get his socks on in sitting.  Postural control: Posterior lean Standing balance support: Bilateral upper extremity supported Standing balance-Leahy Scale: Poor Standing balance comment: needs external support of both hands and lower legs supported by the bed in standing.  Once standing he was two person min assist, during transitions he is mod assist.                            ADL either performed or assessed with clinical judgement   ADL Overall ADL's : Needs assistance/impaired Eating/Feeding: Set up;Supervision/ safety;Sitting   Grooming: Set up;Supervision/safety;Sitting   Upper Body Bathing: Minimal assistance;Sitting   Lower Body Bathing: Moderate assistance;+2 for physical assistance;Sit to/from stand   Upper Body Dressing : Minimal assistance;Sitting   Lower Body Dressing: Moderate assistance;+2 for safety/equipment;Sit to/from stand Lower Body Dressing Details (indicate cue type and reason): Pt able to bring ankles to knees for donning socks. Requiring Mod A for sitting  balance during donning socks due to posterior lean. Mod A +2 for sit<>Stand             Functional mobility during ADLs: Moderate assistance;+2 for physical assistance(side steps) General ADL Comments: Pt presenting with decreased strength, balance, and activity tolerance.      Vision         Perception      Praxis      Pertinent Vitals/Pain Pain Assessment: Faces Faces Pain Scale: Hurts little more Pain Location: Back Pain Descriptors / Indicators: Constant Pain Intervention(s): Limited activity within patient's tolerance;Monitored during session;Repositioned     Hand Dominance Right   Extremity/Trunk Assessment Upper Extremity Assessment Upper Extremity Assessment: RUE deficits/detail RUE Deficits / Details: Edema at right hand.   Lower Extremity Assessment Lower Extremity Assessment: Defer to PT evaluation   Cervical / Trunk Assessment Cervical / Trunk Assessment: Other exceptions Cervical / Trunk Exceptions: s/p lumbar surgery   Communication Communication Communication: No difficulties   Cognition Arousal/Alertness: Awake/alert Behavior During Therapy: Restless Overall Cognitive Status: Impaired/Different from baseline                                 General Comments: Difficult to put a finger on his cognitive deficits and he is alert and oriented x 4.  He is restless, jittery looking, almost nervousness.  He cannot sit still.    General Comments  VSS    Exercises     Shoulder Instructions      Home Living Family/patient expects to be discharged to:: Private residence Living Arrangements: Alone Available Help at Discharge: Friend(s);Other (Comment)(girlfriend, daughter) Type of Home: House Home Access: Stairs to enter [employer] of Steps: 2 and 7 Entrance Stairs-Rails: Right;Left Home Layout: Two level;Able to live on main level with bedroom/bathroom;1/2 bath on main level Alternate Level Stairs-Number of Steps: flight Alternate Level Stairs-Rails: Right;Left;Can reach both Bathroom Shower/Tub: Occupational psychologist: Handicapped height     Home Equipment: Hand held shower head;Shower seat - built in;Cane - single point;Walker - 2 wheels          Prior Functioning/Environment Level of Independence: Independent                  OT Problem List: Decreased strength;Decreased range of motion;Decreased activity tolerance;Impaired balance (sitting and/or standing);Decreased knowledge of use of DME or AE;Decreased knowledge of precautions;Pain      OT Treatment/Interventions: Self-care/ADL training;Therapeutic exercise;Energy conservation;DME and/or AE instruction;Therapeutic activities;Patient/family education    OT Goals(Current goals can be found in the care plan section) Acute Rehab OT Goals Patient Stated Goal: to decrease his pain, go home at discharge OT Goal Formulation: With patient Time For Goal Achievement: 12/26/17 Potential to Achieve Goals: Good ADL Goals Pt Will Perform Lower Body Dressing: with set-up;with supervision;sit to/from stand Pt Will Transfer to Toilet: with set-up;with supervision;ambulating;bedside commode Pt Will Perform Toileting - Clothing Manipulation and hygiene: with set-up;with supervision;sit to/from stand Additional ADL Goal #1: pt will perform bed mobility demonstrating log roll techniques with supervision in preparation for ADLs.  OT Frequency: Min 3X/week   Barriers to D/C:            Co-evaluation PT/OT/SLP Co-Evaluation/Treatment: Yes Reason for Co-Treatment: Complexity of the patient's impairments (multi-system involvement);For patient/therapist safety;To address functional/ADL transfers PT goals addressed during session: Mobility/safety with mobility;Balance OT goals addressed during session: ADL's and self-care      AM-PAC PT "6 Clicks" Daily  Activity     Outcome Measure Help from another person eating meals?: None Help from another person taking care of personal grooming?: A Little Help from another person toileting, which includes using toliet, bedpan, or urinal?: A Lot Help from another person bathing (including washing, rinsing, drying)?: A Lot Help from another person to put on and taking off regular upper body clothing?: A Little Help  from another person to put on and taking off regular lower body clothing?: A Lot 6 Click Score: 16   End of Session Nurse Communication: Mobility status  Activity Tolerance: Patient limited by fatigue;Patient limited by pain Patient left: in bed;with call bell/phone within reach;with bed alarm set  OT Visit Diagnosis: Unsteadiness on feet (R26.81);Other abnormalities of gait and mobility (R26.89);Muscle weakness (generalized) (M62.81);Pain Pain - part of body: (Back)                Time: 0174-9449 OT Time Calculation (min): 24 min Charges:  OT General Charges $OT Visit: 1 Visit OT Evaluation $OT Eval Moderate Complexity: [place], OTR/L Acute Rehab Pager: [phone] Office: [place] 12/12/2017, 4:47 PM

## 2017-12-12 NOTE — Consult Note (Addendum)
Neurology Consultation  Reason for Consult: Confusion Referring Physician: Dr. Wynelle Cleveland  CC: Transient confusion  History is obtained from: Patient  HPI: Tyrone Roman is a 72 y.o. male history significant for myocardial infarction, hypertension, hyperlipidemia, HNP, dysphagia, COPD, cataracts, CAD, BPH.  Patient is currently postop day 1 from a wound washout after a infected microdiscectomy that was done approximately 6 days ago.  Patient had been doing well in the hospital until today at approximately 1400 hrs. when he suddenly became very confused.  Patient states he had 1 dose of oxycodone at approximately 7 AM.  He does admit that he often gets agitated and confused when he does take oxycodone.  Consult was done at approximately 1600 hrs. and patient was back to baseline.  When asked what happened he stated he believed he was confused secondary to drug interaction.  Currently he is alert oriented able to follow all commands.  ROS: ROS was performed and is negative except as noted in the HPI.   Past Medical History:  Diagnosis Date  . Barrett's esophageal ulceration   . BPH (benign prostatic hyperplasia)   . CAD (coronary artery disease)    sees Dr. Mar Daring  . Cataract    both eyes  . Colonic polyp   . Concussion with loss of consciousness of 30 minutes or less   . Contact dermatitis   . Contact with or exposure to venereal diseases   . COPD (chronic obstructive pulmonary disease) ([place])    sees Dr. Kara Mead   . Diverticulosis of colon   . Dysphagia   . GERD (gastroesophageal reflux disease)   . HNP (herniated nucleus pulposus), lumbar    recurrent  . Hyperlipidemia   . Hypertension   . Laceration of scalp   . Myocardial infarction [facility])    2011- stent placed  . Nephrolithiasis   . Pre-operative cardiovascular examination   . Rheumatoid arthritis [facility])    sees Dr. Gavin Pound   . SOB (shortness of breath)     Family History  Problem Relation Age of Onset  . Heart  attack Father        cardiovascular disorder  . Arthritis Father        family hx  . Colon cancer Father        mets  . Prostate cancer Father        1st degree relative  . Arthritis Mother   . Diabetes Paternal Uncle   . Colon cancer Paternal Uncle   . Cancer Maternal Grandmother   . Skin cancer Daughter   . Esophageal cancer Neg Hx   . Stomach cancer Neg Hx   . Rectal cancer Neg Hx    Social History:   reports that he quit smoking about 29 years ago. His smoking use included cigarettes. He has a 20.00 pack-year smoking history. He has never used smokeless tobacco. He reports that he drinks alcohol. He reports that he does not use drugs.  Medications  Current Facility-Administered Medications:  .  0.9 %  sodium chloride infusion, , Intravenous, Continuous, Rizwan, Saima, MD, Last Rate: 75 mL/hr at 12/12/17 1502 .  0.9 %  sodium chloride infusion, , Intravenous, Continuous, Costella, Vincent J, PA-C .  0.9 %  sodium chloride infusion, 250 mL, Intravenous, Continuous, Costella, Vincent J, PA-C, Last Rate: 1 mL/hr at 12/12/17 0114, 250 mL at 12/12/17 0114 .  acetaminophen (TYLENOL) tablet 650 mg, 650 mg, Oral, Q4H PRN **OR** acetaminophen (TYLENOL) suppository 650 mg, 650  mg, Rectal, Q4H PRN, Costella, Vincent J, PA-C .  heparin injection 5,000 Units, 5,000 Units, Subcutaneous, Q8H, Janora Norlander, MD, 5,000 Units at 12/12/17 1438 .  HYDROcodone-acetaminophen (NORCO/VICODIN) 5-325 MG per tablet 1 tablet, 1 tablet, Oral, Q4H PRN, Costella, Vista Mink, PA-C, 1 tablet at 12/11/17 1833 .  HYDROmorphone (DILAUDID) injection 0.5-1 mg, 0.5-1 mg, Intravenous, Q2H PRN, Costella, Vincent J, PA-C .  lactated ringers infusion, , Intravenous, Continuous, Belinda Block, MD, Stopped at 12/11/17 1810 .  menthol-cetylpyridinium (CEPACOL) lozenge 3 mg, 1 lozenge, Oral, PRN **OR** phenol (CHLORASEPTIC) mouth spray 1 spray, 1 spray, Mouth/Throat, PRN, Costella, Vincent J, PA-C .  methocarbamol (ROBAXIN)  tablet 500 mg, 500 mg, Oral, Q6H PRN **OR** methocarbamol (ROBAXIN) 500 mg in dextrose 5 % 50 mL IVPB, 500 mg, Intravenous, Q6H PRN, Costella, Vincent J, PA-C .  metoprolol succinate (TOPROL-XL) 24 hr tablet 25 mg, 25 mg, Oral, Daily, Rizwan, Saima, MD, 25 mg at 12/12/17 0932 .  metoprolol tartrate (LOPRESSOR) injection 5 mg, 5 mg, Intravenous, Once, Schorr, Rhetta Mura, NP, Stopped at 12/11/17 0153 .  multivitamin with minerals tablet 1 tablet, 1 tablet, Oral, Daily, Janora Norlander, MD, 1 tablet at 12/12/17 0932 .  nafcillin 2 g in sodium chloride 0.9 % 100 mL IVPB, 2 g, Intravenous, Q4H, Thayer Headings, MD, Last Rate: 200 mL/hr at 12/12/17 1250, 2 g at 12/12/17 1250 .  nitroGLYCERIN (NITROSTAT) SL tablet 0.4 mg, 0.4 mg, Sublingual, Q5 min PRN, Janora Norlander, MD .  ondansetron [facility]) tablet 4 mg, 4 mg, Oral, Q6H PRN **OR** ondansetron (ZOFRAN) injection 4 mg, 4 mg, Intravenous, Q6H PRN, Costella, Vincent J, PA-C .  oxyCODONE (Oxy IR/ROXICODONE) immediate release tablet 5-10 mg, 5-10 mg, Oral, Q3H PRN, Costella, Vincent J, PA-C, 10 mg at 12/12/17 0701 .  pantoprazole (PROTONIX) EC tablet 40 mg, 40 mg, Oral, Daily, Janora Norlander, MD, 40 mg at 12/12/17 0932 .  simvastatin (ZOCOR) tablet 40 mg, 40 mg, Oral, QPM, Janora Norlander, MD, 40 mg at 12/11/17 0019 .  sodium chloride flush (NS) 0.9 % injection 3 mL, 3 mL, Intravenous, Q12H, Costella, Vincent J, PA-C, 3 mL at 12/11/17 2311 .  sodium chloride flush (NS) 0.9 % injection 3 mL, 3 mL, Intravenous, PRN, Costella, Vincent J, PA-C .  sodium phosphate (FLEET) 7-19 GM/118ML enema 1 enema, 1 enema, Rectal, Once PRN, Costella, Vincent J, PA-C .  tamsulosin (FLOMAX) capsule 0.4 mg, 0.4 mg, Oral, QPM, Janora Norlander, MD, 0.4 mg at 12/11/17 0019 .  zolpidem (AMBIEN) tablet 5 mg, 5 mg, Oral, QHS PRN, Costella, Vista Mink, PA-C   Exam: Current vital signs: BP (!) 143/58 (BP Location: Right Arm)   Pulse 97   Temp 98.3 F (36.8 C) (Oral)   Resp  19   Ht 6\' 2"  (1.88 m)   Wt 105.7 kg   SpO2 94%   BMI 29.92 kg/m  Vital signs in last 24 hours: Temp:  [97.6 F (36.4 C)-99.8 F (37.7 C)] 98.3 F (36.8 C) (11/07 1133) Pulse Rate:  [82-111] 97 (11/07 1133) Resp:  [12-22] 19 (11/07 1133) BP: (132-175)/(58-121) 143/58 (11/07 1133) SpO2:  [86 %-100 %] 94 % (11/07 1133)  Physical Exam  Constitutional: Appears well-developed and well-nourished.  Psych: Affect appropriate to situation Eyes: No scleral injection HENT: No OP obstrucion Head: Normocephalic.  Cardiovascular: Normal rate and regular rhythm.  Respiratory: Effort normal, non-labored breathing GI: Soft.  No distension. There is no tenderness.  Skin: WDI  Neuro: Mental Status:  Patient is awake, alert, oriented to person, place, month, year, and situation. Patient is able to give a clear and coherent history. No signs of aphasia or neglect Cranial Nerves: II: Visual Fields are full. Pupils are equal, round, and reactive to light.   III,IV, VI: EOMI without ptosis or diploplia.  V: Facial sensation is symmetric to temperature VII: Facial movement is symmetric.  VIII: hearing is intact to voice X: Uvula elevates symmetrically XI: Shoulder shrug is symmetric. XII: tongue is midline without atrophy or fasciculations.  Motor: Moving all extremities antigravity Sensory: Sensation is symmetric to light touch and temperature in the arms and legs.  Labs I have reviewed labs in epic and the results pertinent to this consultation are:   CBC    Component Value Date/Time   WBC 6.0 12/12/2017 0512   RBC 4.12 (L) 12/12/2017 0512   HGB 11.9 (L) 12/12/2017 0512   HCT 36.7 (L) 12/12/2017 0512   PLT 99 (L) 12/12/2017 0512   MCV 89.1 12/12/2017 0512   MCH 28.9 12/12/2017 0512   MCHC 32.4 12/12/2017 0512   RDW 13.7 12/12/2017 0512   LYMPHSABS 0.8 12/10/2017 1418   MONOABS 0.9 12/10/2017 1418   EOSABS 0.0 12/10/2017 1418   BASOSABS 0.0 12/10/2017 1418    CMP      Component Value Date/Time   NA 139 12/12/2017 0512   K 3.8 12/12/2017 0512   CL 110 12/12/2017 0512   CO2 24 12/12/2017 0512   GLUCOSE 124 (H) 12/12/2017 0512   BUN 31 (H) 12/12/2017 0512   CREATININE 1.38 (H) 12/12/2017 0512   CREATININE 1.11 01/18/2016 0844   CALCIUM 9.3 12/12/2017 0512   PROT 7.0 12/10/2017 1418   ALBUMIN 2.9 (L) 12/10/2017 1418   AST 58 (H) 12/10/2017 1418   ALT 32 12/10/2017 1418   ALKPHOS 48 12/10/2017 1418   BILITOT 1.4 (H) 12/10/2017 1418   GFRNONAA 50 (L) 12/12/2017 0512   GFRAA 57 (L) 12/12/2017 0512    Lipid Panel     Component Value Date/Time   CHOL 169 12/05/2016 1039   TRIG 87.0 12/05/2016 1039   HDL 61.10 12/05/2016 1039   CHOLHDL 3 12/05/2016 1039   VLDL 17.4 12/05/2016 1039   LDLCALC 91 12/05/2016 1039     Imaging I have reviewed the images obtained:  CT-scan of the brain-no acute intracranial abnormalities  MRI examination of the brain-pending  Etta Quill PA-C [facility] [phone]  M-F  (9:00 am- 5:00 PM)  12/12/2017, 3:54 PM   Attending Neurohospitalist Addendum Patient seen and examined with APP/Resident. Agree with the history and physical as documented above.   Assessment:  Transient confusion most likely secondary to medication side effect and/or interaction.  Currently patient is back to baseline.  CT of head does not show any abnormalities.  However given the fact that he does have sepsis cannot fully rule out some form of embolic shower secondary to septic emboli.   Impression:  Transient confusion - likely toxic metabolic enceph (multifactorial) R/o stroke  Recommendations: - Keep sedating drugs and narcotics down to a minimum - TTE to evaluate for any bacterial thrombus if positive will need TEE - Continue to treat underlying infection - MRI brain to evaluate for any form of possible embolic shower  Please call us with questions. We will follow up on the imaging.  -- Amie Portland,  MD [facility] Pager: [phone] If 7pm to 7am, please call on call as listed on AMION.

## 2017-12-12 NOTE — Evaluation (Signed)
Physical Therapy Evaluation Patient Details Name: Tyrone Roman MRN: [id number] DOB: [birth year] Today's Date: 12/12/2017   History of Present Illness  72 y.o. male admitted on 12/10/17 for AMS.  Found to have sepsis due to back wound infection.  S/p I and D of lumbar wound on 12/11/17.  Pt's blood cultures (+) for MSSA bacteremia.  Pt with continued AMS post op and neuro consulted.  CT (normal) and MRI are pending, but they are suspicious of narcotic reaction.  Pt with significant PMH of R knee arthroscopy (multiple), RA, MI, HTN dysphagia, COPD, CAD, concussion, lumbar microdiscectomy 10/07/17.    Clinical Impression  Pt is restless, painful, increased HR to 122 during EOB mobility.  He was able to stand with mod assist and two person hand held assist and take some side steps towards HOB.  Pt with 2/4 DOE, but O2 sats and BP stable.  PT will continue to follow acutely for safe mobility progression    Follow Up Recommendations Home health PT;Supervision/Assistance - 24 hour    Equipment Recommendations  None recommended by PT    Recommendations for Other Services   NA    Precautions / Restrictions Precautions Precautions: Back Required Braces or Orthoses: Other Brace/Splint Other Brace/Splint: hemo vac      Mobility  Bed Mobility Overal bed mobility: Needs Assistance Bed Mobility: Rolling;Sidelying to Sit;Sit to Sidelying Rolling: Min assist Sidelying to sit: Min assist     Sit to sidelying: Min assist General bed mobility comments: Min assist to roll to side, min assist to support trunk to get to sitting EOB, min assist to lift both legs back up into bed to return to supine/sidelying.  Verbal cues for safe log roll and reverse log roll technique.   Transfers Overall transfer level: Needs assistance Equipment used: 2 person hand held assist Transfers: Sit to/from Stand Sit to Stand: Mod assist         General transfer comment: Mod assist to support trunk over flexed knees  to come to standing with left knee having a buckling moment.    Ambulation/Gait Ambulation/Gait assistance: +2 physical assistance;Min assist Gait Distance (Feet): 3 Feet Assistive device: 2 person hand held assist Gait Pattern/deviations: Step-to pattern     General Gait Details: Side steps up in the bed before repositioning in side lying.          Balance Overall balance assessment: Needs assistance Sitting-balance support: Feet supported;Bilateral upper extremity supported Sitting balance-Leahy Scale: Poor Sitting balance - Comments: need min to mod assist to support trunk in sitting while practicing figure 4 crossing of legs to get his socks on in sitting.  Postural control: Posterior lean Standing balance support: Bilateral upper extremity supported Standing balance-Leahy Scale: Poor Standing balance comment: needs external support of both hands and lower legs supported by the bed in standing.  Once standing he was two person min assist, during transitions he is mod assist.                              Pertinent Vitals/Pain      Home Living Family/patient expects to be discharged to:: Private residence Living Arrangements: Alone Available Help at Discharge: Friend(s);Other (Comment)(girlfriend, daughter) Type of Home: House Home Access: Stairs to enter Entrance Stairs-Rails: Right;Left Entrance Stairs-Number of Steps: 2 and 7 Home Layout: Two level;Able to live on main level with bedroom/bathroom;1/2 bath on main level Home Equipment: Hand held shower head;Shower seat - built  in;Cane - single point;Walker - 2 wheels      Prior Function Level of Independence: Independent               Hand Dominance   Dominant Hand: Right    Extremity/Trunk Assessment   Upper Extremity Assessment Upper Extremity Assessment: Defer to OT evaluation    Lower Extremity Assessment Lower Extremity Assessment: Generalized weakness    Cervical / Trunk  Assessment Cervical / Trunk Assessment: Other exceptions Cervical / Trunk Exceptions: s/p lumbar surgery  Communication   Communication: No difficulties  Cognition Arousal/Alertness: Awake/alert Behavior During Therapy: Restless Overall Cognitive Status: Impaired/Different from baseline                                 General Comments: Difficult to put a finger on his cognitive deficits and he is alert and oriented x 4.  He is restless, jittery looking, almost nervousness.  He cannot sit still.       General Comments      Exercises     Assessment/Plan    PT Assessment Patient needs continued PT services  PT Problem List Decreased strength;Decreased activity tolerance;Decreased balance;Decreased mobility;Decreased knowledge of use of DME;Decreased knowledge of precautions;Pain       PT Treatment Interventions DME instruction;Gait training;Stair training;Functional mobility training;Therapeutic activities;Therapeutic exercise;Balance training;Cognitive remediation;Patient/family education    PT Goals (Current goals can be found in the Care Plan section)  Acute Rehab PT Goals Patient Stated Goal: to decrease his pain, go home at discharge PT Goal Formulation: With patient Time For Goal Achievement: 12/26/17 Potential to Achieve Goals: Good    Frequency Min 3X/week   Barriers to discharge        Co-evaluation PT/OT/SLP Co-Evaluation/Treatment: Yes Reason for Co-Treatment: Complexity of the patient's impairments (multi-system involvement);Necessary to address cognition/behavior during functional activity;For patient/therapist safety;To address functional/ADL transfers PT goals addressed during session: Mobility/safety with mobility;Balance         AM-PAC PT "6 Clicks" Daily Activity  Outcome Measure Difficulty turning over in bed (including adjusting bedclothes, sheets and blankets)?: Unable Difficulty moving from lying on back to sitting on the side of  the bed? : Unable Difficulty sitting down on and standing up from a chair with arms (e.g., wheelchair, bedside commode, etc,.)?: Unable Help needed moving to and from a bed to chair (including a wheelchair)?: A Little Help needed walking in hospital room?: A Little Help needed climbing 3-5 steps with a railing? : A Lot 6 Click Score: 11    End of Session   Activity Tolerance: Patient limited by pain Patient left: in bed;with call bell/phone within reach Nurse Communication: Mobility status PT Visit Diagnosis: Muscle weakness (generalized) (M62.81);Pain;Other symptoms and signs involving the nervous system (R29.898);Difficulty in walking, not elsewhere classified (R26.2) Pain - Right/Left: (lower) Pain - part of body: (back)    Time: 1538-1610 PT Time Calculation (min) (ACUTE ONLY): 32 min   Charges:       Wells Guiles B. Rhiannon Sassaman, PT, DPT  Acute Rehabilitation #[phone] pager #[phone] office   PT Evaluation $PT Eval Moderate Complexity: 1 Mod          12/12/2017, 4:17 PM

## 2017-12-12 NOTE — Progress Notes (Signed)
[facility] will be providing Home [facility]fusion [facility] services with ID team as well as [place] services for [facility] and [facility] Infusion Coordinator will provide in hospital teaching for IV ABX prior to DC.   If patient discharges after hours, please call [phone].   Larry Sierras 12/12/2017, 5:27 PM
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
## 2017-12-12 NOTE — Progress Notes (Signed)
PROGRESS NOTE    Tyrone Roman   BTD:[id number]  DOB: 07-Aug-1945  DOA: 12/10/2017 PCP: [name], MD   Brief Narrative:  Tyrone Roman  is a 72 y.o. male with medical history significant for RA not currently on meds, lumbar microdiscetcomy 12/06/17 by Dr. Kathyrn Sheriff, who presented to the ED  with c/o progressive back pain 5/10 at rest and 10/10 with movement, AMS, increased weakness (reportedly of his LLE but poor historian). No fevers. Called after hours nurse Sat due to increased pain, was told to increase oxycodone to q2h from q4h. He has a friend who stays with him who states he became more lethargic after this, and stopped eating and drinking almost entirely since Sat. No oxy has been given since Sat afternoon. Friend reports that pt does not drink alcohol hardly ever; no h/o illicit drugs or smoking.   Subjective: Evaluated this AM. Was sleepy but oriented. Stated back pain was resolving. Later called by RN ~ 2 pm that he was suddenly confused. Only had 1 dose of Oxycodone at 7 AM. Within 10 min of my eval, he was re-oriented again. Does not have h/o ETOH abuse and does not use Benzodiazepines. Friend in room extremely anxious.     Assessment & Plan:   Principal Problem:   Severe Sepsis , MSSA bacteremia s/p lumbar wound infection - ID consulted and managing antibiotics - LA elevated at high at 4.28 and still 3.9 yesterday afternoon -improved today- TEE requested by ID    HNP (herniated nucleus pulposus), lumbar with incontinence/ cauda equina after a lifting accident (failed outpt treatment) - s/p s/p microdiscectomy 5d ago - now has infected wound with purluent drainage - OR this afternoon per NS - hold Aspirin - cannot use Toradol or other NSAIDs yet for pain in setting of AKI - on Hydrocodone PRN  ? Blurred and double vision 11/6 - also confused and therefore unreliable historian - f/u CT head Addendum: CT head negative for CVA  Acute toxic encephalopathy - ?  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  - not alcoholic or benzo user per history - check B12, HHIV, RPR - possibly just secondary to sepsis - Addendum: spoke with Neuro, Dr Malen Gauze- recommended MRI     AKI (acute kidney injury) (HC - Cr 2.27- baseline ~ 0.9-1.0 - improved to 1.38 today - on NS 150 cc/hr- will decrease to 75 today -  hold Naproxen- follow I and O  Hyperlipidemia - simvastatin  Rheumatoid arthritis - Follows with Dr Trudie Reed - appears that he was on Cimza injections in the past but not on anything recently  Coronary artery disease of native artery of native heart with stable angina pectoris   - cont statin- ASA on hold  HTN - cont Toprol and Imdur as BP is elevated     GERD - on Prilosec at home- cont PPI    COPD (chronic obstructive pulmonary disease) - stable    Constipation due to pain medication - had large BM on first night - states he did not take any laxatives at home  DVT prophylaxis: SCDs Code Status: Full code Family Communication: spoke with friend at bedside- Voula Boutis Disposition Plan: follow in SDU for today Consultants:   NS  ID Procedures:    11/6- surgical wound debridement and washout Antimicrobials:  Anti-infectives (From admission, onward)   Start     Dose/Rate Route Frequency Ordered Stop   12/12/17 1200  nafcillin 2 g in sodium chloride 0.9 % 100  mL IVPB     2 g 200 mL/hr over 30 Minutes Intravenous Every 4 hours 12/12/17 0919     12/11/17 1623  bacitracin 50,000 Units in sodium chloride 0.9 % 500 mL irrigation  Status:  Discontinued       As needed 12/11/17 1624 12/11/17 1649   12/11/17 1500  vancomycin (VANCOCIN) 1,250 mg in sodium chloride 0.9 % 250 mL IVPB  Status:  Discontinued     1,250 mg 166.7 mL/hr over 90 Minutes Intravenous Every 24 hours 12/10/17 1558 12/12/17 0919   12/10/17 2300  aztreonam (AZACTAM) 1 g in sodium chloride 0.9 % 100 mL IVPB  Status:  Discontinued     1 g 200 mL/hr over 30 Minutes Intravenous  Every 8 hours 12/10/17 1558 12/11/17 0805   12/10/17 1515  vancomycin (VANCOCIN) 2,000 mg in sodium chloride 0.9 % 500 mL IVPB     2,000 mg 250 mL/hr over 120 Minutes Intravenous  Once 12/10/17 1500 12/10/17 1832   12/10/17 1445  aztreonam (AZACTAM) 2 g in sodium chloride 0.9 % 100 mL IVPB     2 g 200 mL/hr over 30 Minutes Intravenous  Once 12/10/17 1443 12/10/17 1558   12/10/17 1445  metroNIDAZOLE (FLAGYL) IVPB 500 mg  Status:  Discontinued     500 mg 100 mL/hr over 60 Minutes Intravenous Every 8 hours 12/10/17 1443 12/11/17 0902   12/10/17 1445  vancomycin (VANCOCIN) IVPB 1000 mg/200 mL premix  Status:  Discontinued     1,000 mg 200 mL/hr over 60 Minutes Intravenous  Once 12/10/17 1443 12/10/17 1500       Objective: Vitals:   12/12/17 0900 12/12/17 0932 12/12/17 1000 12/12/17 1133  BP:  (!) 144/78 (!) 139/94 (!) 143/58  Pulse: 94 90 82 97  Resp: 16  14 19   Temp:    98.3 F (36.8 C)  TempSrc:    Oral  SpO2: (!) 86%  92% 94%  Weight:      Height:        Intake/Output Summary (Last 24 hours) at 12/12/2017 1451 Last data filed at 12/12/2017 1045 Gross per 24 hour  Intake 4156.51 ml  Output 565 ml  Net 3591.51 ml   Filed Weights   12/10/17 1401  Weight: 105.7 kg    Examination: General exam: Appears comfortable  HEENT: PERRLA, oral mucosa moist, no sclera icterus or thrush Respiratory system: Clear to auscultation. Respiratory effort normal. Cardiovascular system: S1 & S2 heard,  No murmurs  Gastrointestinal system: Abdomen soft, non-tender, nondistended. Normal bowel sound. No organomegaly Central nervous system: Alert - Fluctuation in mental status with confusion- No focal weakness. Speech slow as if drunk Extremities: No cyanosis, clubbing or edema    Data Reviewed: I have personally reviewed following labs and imaging studies  CBC: Recent Labs  Lab 12/06/17 1237 12/10/17 1418 12/11/17 0500 12/12/17 0512  WBC 4.6 5.2 4.8 6.0  NEUTROABS  --  3.5  --   --     HGB 13.8 16.2 13.4 11.9*  HCT 42.4 49.6 41.3 36.7*  MCV 90.2 89.4 87.1 89.1  PLT 138* 150 99* 99*   Basic Metabolic Panel: Recent Labs  Lab 12/06/17 1237 12/10/17 1418 12/11/17 0500 12/12/17 0512  NA 140 135 139 139  K 3.8 4.3 3.9 3.8  CL 112* 99 110 110  CO2 25 24 22 24   GLUCOSE 112* 172* 152* 124*  BUN 21 48* 37* 31*  CREATININE 1.05 2.27* 1.71* 1.38*  CALCIUM 9.7 10.5* 9.3 9.3  GFR: Estimated Creatinine Clearance: 62.7 mL/min (A) (by C-G formula based on SCr of 1.38 mg/dL (H)). Liver Function Tests: Recent Labs  Lab 12/10/17 1418  AST 58*  ALT 32  ALKPHOS 48  BILITOT 1.4*  PROT 7.0  ALBUMIN 2.9*   No results for input(s): LIPASE, AMYLASE in the last 168 hours. Recent Labs  Lab 12/10/17 1832  AMMONIA 20   Coagulation Profile: No results for input(s): INR, PROTIME in the last 168 hours. Cardiac Enzymes: No results for input(s): CKTOTAL, CKMB, CKMBINDEX, TROPONINI in the last 168 hours. BNP (last 3 results) No results for input(s): PROBNP in the last 8760 hours. HbA1C: No results for input(s): HGBA1C in the last 72 hours. CBG: Recent Labs  Lab 12/10/17 1416  GLUCAP 174*   Lipid Profile: No results for input(s): CHOL, HDL, LDLCALC, TRIG, CHOLHDL, LDLDIRECT in the last 72 hours. Thyroid Function Tests: No results for input(s): TSH, T4TOTAL, FREET4, T3FREE, THYROIDAB in the last 72 hours. Anemia Panel: No results for input(s): VITAMINB12, FOLATE, FERRITIN, TIBC, IRON, RETICCTPCT in the last 72 hours. Urine analysis:    Component Value Date/Time   COLORURINE YELLOW 12/10/2017 1412   APPEARANCEUR HAZY (A) 12/10/2017 1412   LABSPEC 1.023 12/10/2017 1412   PHURINE 5.0 12/10/2017 1412   GLUCOSEU NEGATIVE 12/10/2017 1412   HGBUR MODERATE (A) 12/10/2017 1412   HGBUR negative 01/26/2010 0852   BILIRUBINUR NEGATIVE 12/10/2017 1412   BILIRUBINUR N 12/05/2016 1045   KETONESUR NEGATIVE 12/10/2017 1412   PROTEINUR 100 (A) 12/10/2017 1412   UROBILINOGEN 0.2  12/05/2016 1045   UROBILINOGEN 0.2 01/26/2010 0852   NITRITE NEGATIVE 12/10/2017 1412   LEUKOCYTESUR NEGATIVE 12/10/2017 1412   Sepsis Labs: @LABRCNTIP (procalcitonin:4,lacticidven:4) ) Recent Results (from the past 240 hour(s))  Blood Culture (routine x 2)     Status: Abnormal (Preliminary result)   Collection Time: 12/10/17  2:43 PM  Result Value Ref Range Status   Specimen Description BLOOD RIGHT ANTECUBITAL  Final   Special Requests   Final    BOTTLES DRAWN AEROBIC AND ANAEROBIC Blood Culture adequate volume   Culture  Setup Time   Final    GRAM POSITIVE COCCI IN BOTH AEROBIC AND ANAEROBIC BOTTLES CRITICAL RESULT CALLED TO, READ BACK BY AND VERIFIED WITH: M. PHAM, PHARMD AT 0845 ON 12/11/17 BY C. JESSUP, MLT. Performed at [facility], [place] [address], [place], [place] [address]    Culture STAPHYLOCOCCUS AUREUS (A)  Final   Report Status PENDING  Incomplete  Blood Culture ID Panel (Reflexed)     Status: Abnormal   Collection Time: 12/10/17  2:43 PM  Result Value Ref Range Status   Enterococcus species NOT DETECTED NOT DETECTED Final   Listeria monocytogenes NOT DETECTED NOT DETECTED Final   Staphylococcus species DETECTED (A) NOT DETECTED Final    Comment: CRITICAL RESULT CALLED TO, READ BACK BY AND VERIFIED WITH: M. PHAM, PHARMD AT 0845 ON 12/11/17 BY C. JESSUP, MLT.    Staphylococcus aureus (BCID) DETECTED (A) NOT DETECTED Final    Comment: Methicillin (oxacillin) susceptible Staphylococcus aureus (MSSA). Preferred therapy is anti staphylococcal beta lactam antibiotic (Cefazolin or Nafcillin), unless clinically contraindicated. CRITICAL RESULT CALLED TO, READ BACK BY AND VERIFIED WITH: M. PHAM, PHARMD AT 0845 ON 12/11/17 BY C. JESSUP, MLT.    Methicillin resistance NOT DETECTED NOT DETECTED Final   Streptococcus species NOT DETECTED NOT DETECTED Final   Streptococcus agalactiae NOT DETECTED NOT DETECTED Final   Streptococcus pneumoniae NOT DETECTED NOT DETECTED Final  Streptococcus pyogenes NOT DETECTED NOT DETECTED Final   Acinetobacter baumannii NOT DETECTED NOT DETECTED Final   Enterobacteriaceae species NOT DETECTED NOT DETECTED Final   Enterobacter cloacae complex NOT DETECTED NOT DETECTED Final   Escherichia coli NOT DETECTED NOT DETECTED Final   Klebsiella oxytoca NOT DETECTED NOT DETECTED Final   Klebsiella pneumoniae NOT DETECTED NOT DETECTED Final   Proteus species NOT DETECTED NOT DETECTED Final   Serratia marcescens NOT DETECTED NOT DETECTED Final   Haemophilus influenzae NOT DETECTED NOT DETECTED Final   Neisseria meningitidis NOT DETECTED NOT DETECTED Final   Pseudomonas aeruginosa NOT DETECTED NOT DETECTED Final   Candida albicans NOT DETECTED NOT DETECTED Final   Candida glabrata NOT DETECTED NOT DETECTED Final   Candida krusei NOT DETECTED NOT DETECTED Final   Candida parapsilosis NOT DETECTED NOT DETECTED Final   Candida tropicalis NOT DETECTED NOT DETECTED Final    Comment: Performed at [facility], [place] [address] [place], [place] [address]  Blood Culture (routine x 2)     Status: Abnormal (Preliminary result)   Collection Time: 12/10/17  3:30 PM  Result Value Ref Range Status   Specimen Description BLOOD RIGHT ARM  Final   Special Requests   Final    BOTTLES DRAWN AEROBIC AND ANAEROBIC Blood Culture adequate volume   Culture  Setup Time   Final    GRAM POSITIVE COCCI IN BOTH AEROBIC AND ANAEROBIC BOTTLES CRITICAL RESULT CALLED TO, READ BACK BY AND VERIFIED WITH: M. PHAM, PHARMD, AT 0845 ON 12/11/17 BY C. JESSUP, MLT. Performed at [facility], [place] [address] [place], [place] [address]    Culture STAPHYLOCOCCUS AUREUS (A)  Final   Report Status PENDING  Incomplete  MRSA PCR Screening     Status: None   Collection Time: 12/10/17  9:26 PM  Result Value Ref Range Status   MRSA by PCR NEGATIVE NEGATIVE Final    Comment:        The GeneXpert MRSA Assay (FDA approved for NASAL specimens only), is one component of  a comprehensive MRSA colonization surveillance program. It is not intended to diagnose MRSA infection nor to guide or monitor treatment for MRSA infections. Performed at [facility], [place] [address], [place], [place] [address]   Aerobic Culture (superficial specimen)     Status: None (Preliminary result)   Collection Time: 12/11/17 10:00 AM  Result Value Ref Range Status   Specimen Description WOUND BACK  Final   Special Requests NONE  Final   Gram Stain   Final    RARE WBC PRESENT, PREDOMINANTLY PMN FEW GRAM POSITIVE COCCI Performed at [facility], [place] [address] [place], [place] [address]    Culture MODERATE STAPHYLOCOCCUS AUREUS  Final   Report Status PENDING  Incomplete  Aerobic/Anaerobic Culture (surgical/deep wound)     Status: None (Preliminary result)   Collection Time: 12/11/17  4:09 PM  Result Value Ref Range Status   Specimen Description WOUND  Final   Special Requests NONE  Final   Gram Stain   Final    FEW WBC PRESENT, PREDOMINANTLY PMN FEW GRAM POSITIVE COCCI IN PAIRS IN CLUSTERS Performed at [facility], [place] [address], [place],  [address]    Culture ABUNDANT STAPHYLOCOCCUS AUREUS  Final   Report Status PENDING  Incomplete  Aerobic/Anaerobic Culture (surgical/deep wound)     Status: None (Preliminary result)   Collection Time: 12/11/17  4:09 PM  Result Value Ref Range Status  Specimen Description WOUND  Final   Special Requests NONE  Final   Gram Stain   Final    FEW WBC PRESENT, PREDOMINANTLY PMN FEW GRAM POSITIVE COCCI IN PAIRS IN CLUSTERS Performed at [facility], [place] [address], [place], [place] [address]    Culture MODERATE STAPHYLOCOCCUS AUREUS  Final   Report Status PENDING  Incomplete         Radiology Studies: Ct Head Wo Contrast  Result Date: 12/11/2017 CLINICAL DATA:  Headache, dizziness, and blurred vision beginning yesterday. Neuro deficits, subacute. EXAM: CT HEAD WITHOUT CONTRAST TECHNIQUE:  Contiguous axial images were obtained from the base of the skull through the vertex without intravenous contrast. COMPARISON:  MRI brain 12/28/2009 FINDINGS: Brain: No acute infarct, hemorrhage, or mass lesion is present. The ventricles are of normal size. No significant white matter disease is present. An arachnoid cyst is again noted in the posterior fossa. The brainstem and cerebellum are otherwise normal. Vascular: No hyperdense vessel or unexpected calcification. Skull: Calvarium is intact. No focal lytic or blastic lesions are present. Sinuses/Orbits: Mild mucosal thickening is present throughout the ethmoid air cells and inferior frontal sinuses. Mild mucosal thickening is present in the sphenoid sinuses bilaterally. The remaining paranasal sinuses and the mastoid air cells are clear. Globes and orbits are within normal limits. IMPRESSION: 1. Normal CT appearance of the brain. 2. Mild paranasal sinus disease. Electronically Signed   By: San Morelle M.D.   On: 12/11/2017 12:28   Dg Chest Port 1 View  Result Date: 12/10/2017 CLINICAL DATA:  Altered mental status. Status post lumbar laminectomy and decompression 12/06/2017. EXAM: PORTABLE CHEST 1 VIEW COMPARISON:  PA and lateral chest 09/07/2017.  CT chest 11/27/2010. FINDINGS: The lungs are emphysematous but clear. Heart size is normal. No pneumothorax or pleural effusion. No acute or focal bony abnormality. IMPRESSION: Emphysema without acute disease. Electronically Signed   By: Inge Rise M.D.   On: 12/10/2017 15:30      Scheduled Meds: . heparin  5,000 Units Subcutaneous Q8H  . metoprolol succinate  25 mg Oral Daily  . metoprolol tartrate  5 mg Intravenous Once  . multivitamin with minerals  1 tablet Oral Daily  . pantoprazole  40 mg Oral Daily  . simvastatin  40 mg Oral QPM  . sodium chloride flush  3 mL Intravenous Q12H  . tamsulosin  0.4 mg Oral QPM   Continuous Infusions: . sodium chloride 125 mL/hr at 12/12/17 0822  .  sodium chloride    . sodium chloride 250 mL (12/12/17 0114)  . lactated ringers Stopped (12/11/17 1810)  . methocarbamol (ROBAXIN) IV    . nafcillin IV 2 g (12/12/17 1250)     LOS: 2 days    Time spent in minutes: 45 -50 min as patient evaluated twice   Debbe Odea, MD [facility] Pager: [email or web address] Password [email or web address] 12/12/2017, 2:51 PM

## 2017-12-12 NOTE — Progress Notes (Signed)
  NEUROSURGERY PROGRESS NOTE   No issues overnight. Says he is doing "fair," back pain improving.  EXAM:  BP (!) 143/58 (BP Location: Right Arm)   Pulse 97   Temp 98.3 F (36.8 C) (Oral)   Resp 19   Ht 6\' 2"  (1.88 m)   Wt 105.7 kg   SpO2 94%   BMI 29.92 kg/m   Awake, alert Speech fluent, appropriate  CN grossly intact  5/5 BUE/BLE  Wound with minimal drainage, Hemovac drain in place  IMPRESSION:  72 y.o. male POD#1 wound washout, MSSA bacteremia. Very unusual to have wound infection this quickly after surgery.  PLAN: - Cont Hemovac - Cont antibiotics per ID

## 2017-12-12 NOTE — Care Management Note (Signed)
Case Management Note  Patient Details  Name: Tyrone Roman MRN: [id number] Date of Birth: 02/14/1945  Subjective/Objective:  72 y.o. male admitted on 12/10/17 for AMS.  Found to have sepsis due to back wound infection.  S/p I and D of lumbar wound on 12/11/17.  Pt's blood cultures (+) for MSSA bacteremia.  PTA, pt independent, lives alone.                    Action/Plan: PT/OT recommending HH follow up with 24h supervision.  Pt will also need HHRN follow up for IV antibiotics at home.  Pt states he has a girlfriend and two daughters who should be able to assist with IV abx administration at home.  Referral to [facility] for [facility] follow up and need for IV antibiotics/teaching.  MD:  Please enter HHRN, PT, and OT orders with face to face documentation for Medicare.  HHRN for home IV antibiotics and PICC line care.  Will also need separate hardcopy Rx for IV antibiotic at discharge.  Expected Discharge Date:                  Expected Discharge Plan:  [place]  In-House Referral:     Discharge planning Services  CM Consult  Post Acute Care Choice:  Home Health Choice offered to:  Patient  DME Arranged:    DME Agency:     HH Arranged:  RN, PT, OT [place] Agency:  [place]  Status of Service:  In process, will continue to follow  If discussed at Long Length of Stay Meetings, dates discussed:    Additional Comments:  Reinaldo Raddle, RN, BSN  Trauma/Neuro ICU Case Manager [phone]

## 2017-12-12 NOTE — Progress Notes (Signed)
Pt came to MRI, when pt first came to department he was talking to our tech extender.  In the middle of getting the pt ready for his exam he became unresponsive but still hold vitals.  Called RN who said this was not pts normal state, so rapid response was called and evaluated by the RN.  Pt sent back upstairs until he becomes more stable for the exam.

## 2017-12-13 ENCOUNTER — Inpatient Hospital Stay (HOSPITAL_COMMUNITY): Payer: Medicare Other

## 2017-12-13 DIAGNOSIS — I5030 Unspecified diastolic (congestive) heart failure: Secondary | ICD-10-CM

## 2017-12-13 LAB — BASIC METABOLIC PANEL
Anion gap: 5 (ref 5–15)
BUN: 26 mg/dL — ABNORMAL HIGH (ref 8–23)
CO2: 24 mmol/L (ref 22–32)
Calcium: 9.1 mg/dL (ref 8.9–10.3)
Chloride: 110 mmol/L (ref 98–111)
Creatinine, Ser: 1.56 mg/dL — ABNORMAL HIGH (ref 0.61–1.24)
GFR calc Af Amer: 49 mL/min — ABNORMAL LOW (ref >60)
GFR calc non Af Amer: 43 mL/min — ABNORMAL LOW (ref >60)
Glucose, Bld: 118 mg/dL — ABNORMAL HIGH (ref 70–99)
Potassium: 3.6 mmol/L (ref 3.5–5.1)
Sodium: 139 mmol/L (ref 135–145)

## 2017-12-13 LAB — AEROBIC CULTURE W GRAM STAIN (SUPERFICIAL SPECIMEN)

## 2017-12-13 LAB — CBC
HCT: 36.7 % — ABNORMAL LOW (ref 39.0–52.0)
Hemoglobin: 11.6 g/dL — ABNORMAL LOW (ref 13.0–17.0)
MCH: 28.2 pg (ref 26.0–34.0)
MCHC: 31.6 g/dL (ref 30.0–36.0)
MCV: 89.1 fL (ref 80.0–100.0)
Platelets: 112 10*3/uL — ABNORMAL LOW (ref 150–400)
RBC: 4.12 MIL/uL — ABNORMAL LOW (ref 4.22–5.81)
RDW: 13.8 % (ref 11.5–15.5)
WBC: 7.9 10*3/uL (ref 4.0–10.5)
nRBC: 0 % (ref 0.0–0.2)

## 2017-12-13 LAB — CULTURE, BLOOD (ROUTINE X 2)
Special Requests: ADEQUATE
Special Requests: ADEQUATE

## 2017-12-13 LAB — ECHOCARDIOGRAM COMPLETE
Height: 74 in
Weight: 3728 oz

## 2017-12-13 LAB — RPR: RPR Ser Ql: NONREACTIVE

## 2017-12-13 LAB — HIV ANTIBODY (ROUTINE TESTING W REFLEX): HIV Screen 4th Generation wRfx: NONREACTIVE

## 2017-12-13 NOTE — Progress Notes (Signed)
  Echocardiogram 2D Echocardiogram has been performed.  Tyrone Roman 12/13/2017, 12:35 PM

## 2017-12-13 NOTE — Progress Notes (Signed)
[facility] TEAM 1 - Stepdown/ICU TEAM  WILMONT OLUND  DQQ:[id number] DOB: 1945-06-20 DOA: 12/10/2017 PCP: [name], MD    Brief Narrative:  72 y.o.malew/ a hx of RA not on meds and a lumbarmicrodiscetcomy 12/06/17 by Dr. Kathyrn Sheriff who presented to the ED with c/oprogressive back pain 5/10 at rest and 10/10 with movement,AMS, and increased weakness of his LLE.  Subjective: Became very agitated last night when MRI was being attempted. Reportedly fully recovered as day has progressed. Able to get MRI today, w/ no acute findings. Sleeping soundly at time of my exam. No resp distress or uncontrolled pain.    Assessment & Plan:  Severe Sepsis - MSSA bacteremia due to lumbar wound infection ID consulted managing antibiotics - wound I&D 11/6   HNP s/p s/p microdiscectomy 12/06/17 NS following   Acute toxic metabolic encephalopathy likely multifactorial in the setting of infection, recent surgery, sedating medications, and hospital setting - MRI brain w/o acute findings - attempt to avoid sedatives as able   AKI  baseline ~ 0.9-1.0 - hydrate and follow trend   Recent Labs  Lab 12/10/17 1418 12/11/17 0500 12/12/17 0512 12/13/17 0402  CREATININE 2.27* 1.71* 1.38* 1.56*    Rheumatoid arthritis was on Cimza injections in the past but on no focused tx of late   Coronary artery disease    cont statin - ASA on hold  HTN No change in tx today - follow trend  GERD  COPD    DVT prophylaxis: SCDs Code Status: FULL CODE Family Communication: ex-wife and daughter at bedside Disposition Plan: SDY  Consultants:  ID NS Neuro  Antimicrobials:  Nafcillin 11/7 > Aztreonam 11/5 Vanc 11/5 > 11/6   Objective: Blood pressure (!) 153/93, pulse (!) 106, temperature 98 F (36.7 C), temperature source Oral, resp. rate 19, height 6\' 2"  (1.88 m), weight 105.7 kg, SpO2 100 %.  Intake/Output Summary (Last 24 hours) at 12/13/2017 1526 Last data filed at 12/13/2017 1157 Gross  per 24 hour  Intake 2518.86 ml  Output 2625 ml  Net -106.14 ml   Filed Weights   12/10/17 1401  Weight: 105.7 kg    Examination: General: No acute respiratory distress Lungs: Clear to auscultation bilaterally without wheezes or crackles Cardiovascular: Regular rate and rhythm without murmur  Abdomen: Nondistended, soft, bowel sounds positive Extremities: No significant cyanosis, clubbing, or edema bilateral lower extremities  CBC: Recent Labs  Lab 12/10/17 1418 12/11/17 0500 12/12/17 0512 12/13/17 0402  WBC 5.2 4.8 6.0 7.9  NEUTROABS 3.5  --   --   --   HGB 16.2 13.4 11.9* 11.6*  HCT 49.6 41.3 36.7* 36.7*  MCV 89.4 87.1 89.1 89.1  PLT 150 99* 99* 194*   Basic Metabolic Panel: Recent Labs  Lab 12/11/17 0500 12/12/17 0512 12/13/17 0402  NA 139 139 139  K 3.9 3.8 3.6  CL 110 110 110  CO2 22 24 24   GLUCOSE 152* 124* 118*  BUN 37* 31* 26*  CREATININE 1.71* 1.38* 1.56*  CALCIUM 9.3 9.3 9.1   GFR: Estimated Creatinine Clearance: 55.5 mL/min (A) (by C-G formula based on SCr of 1.56 mg/dL (H)).  Liver Function Tests: Recent Labs  Lab 12/10/17 1418  AST 58*  ALT 32  ALKPHOS 48  BILITOT 1.4*  PROT 7.0  ALBUMIN 2.9*    Recent Labs  Lab 12/10/17 1832  AMMONIA 20    CBG: Recent Labs  Lab 12/10/17 1416  GLUCAP 174*    Recent Results (from the past  240 hour(s))  Blood Culture (routine x 2)     Status: Abnormal   Collection Time: 12/10/17  2:43 PM  Result Value Ref Range Status   Specimen Description BLOOD RIGHT ANTECUBITAL  Final   Special Requests   Final    BOTTLES DRAWN AEROBIC AND ANAEROBIC Blood Culture adequate volume   Culture  Setup Time   Final    GRAM POSITIVE COCCI IN BOTH AEROBIC AND ANAEROBIC BOTTLES CRITICAL RESULT CALLED TO, READ BACK BY AND VERIFIED WITH: M. PHAM, PHARMD AT 0845 ON 12/11/17 BY C. JESSUP, MLT. Performed at [facility], [place] [address], [place], [place] [address]    Culture STAPHYLOCOCCUS AUREUS (A)  Final    Report Status 12/13/2017 FINAL  Final   Organism ID, Bacteria STAPHYLOCOCCUS AUREUS  Final      Susceptibility   Staphylococcus aureus - MIC*    CIPROFLOXACIN <=0.5 SENSITIVE Sensitive     ERYTHROMYCIN <=0.25 SENSITIVE Sensitive     GENTAMICIN <=0.5 SENSITIVE Sensitive     OXACILLIN <=0.25 SENSITIVE Sensitive     TETRACYCLINE <=1 SENSITIVE Sensitive     VANCOMYCIN <=0.5 SENSITIVE Sensitive     TRIMETH/SULFA <=10 SENSITIVE Sensitive     CLINDAMYCIN <=0.25 SENSITIVE Sensitive     RIFAMPIN <=0.5 SENSITIVE Sensitive     Inducible Clindamycin NEGATIVE Sensitive     * STAPHYLOCOCCUS AUREUS  Blood Culture ID Panel (Reflexed)     Status: Abnormal   Collection Time: 12/10/17  2:43 PM  Result Value Ref Range Status   Enterococcus species NOT DETECTED NOT DETECTED Final   Listeria monocytogenes NOT DETECTED NOT DETECTED Final   Staphylococcus species DETECTED (A) NOT DETECTED Final    Comment: CRITICAL RESULT CALLED TO, READ BACK BY AND VERIFIED WITH: M. PHAM, PHARMD AT 0845 ON 12/11/17 BY C. JESSUP, MLT.    Staphylococcus aureus (BCID) DETECTED (A) NOT DETECTED Final    Comment: Methicillin (oxacillin) susceptible Staphylococcus aureus (MSSA). Preferred therapy is anti staphylococcal beta lactam antibiotic (Cefazolin or Nafcillin), unless clinically contraindicated. CRITICAL RESULT CALLED TO, READ BACK BY AND VERIFIED WITH: M. PHAM, PHARMD AT 0845 ON 12/11/17 BY C. JESSUP, MLT.    Methicillin resistance NOT DETECTED NOT DETECTED Final   Streptococcus species NOT DETECTED NOT DETECTED Final   Streptococcus agalactiae NOT DETECTED NOT DETECTED Final   Streptococcus pneumoniae NOT DETECTED NOT DETECTED Final   Streptococcus pyogenes NOT DETECTED NOT DETECTED Final   Acinetobacter baumannii NOT DETECTED NOT DETECTED Final   Enterobacteriaceae species NOT DETECTED NOT DETECTED Final   Enterobacter cloacae complex NOT DETECTED NOT DETECTED Final   Escherichia coli NOT DETECTED NOT DETECTED Final     Klebsiella oxytoca NOT DETECTED NOT DETECTED Final   Klebsiella pneumoniae NOT DETECTED NOT DETECTED Final   Proteus species NOT DETECTED NOT DETECTED Final   Serratia marcescens NOT DETECTED NOT DETECTED Final   Haemophilus influenzae NOT DETECTED NOT DETECTED Final   Neisseria meningitidis NOT DETECTED NOT DETECTED Final   Pseudomonas aeruginosa NOT DETECTED NOT DETECTED Final   Candida albicans NOT DETECTED NOT DETECTED Final   Candida glabrata NOT DETECTED NOT DETECTED Final   Candida krusei NOT DETECTED NOT DETECTED Final   Candida parapsilosis NOT DETECTED NOT DETECTED Final   Candida tropicalis NOT DETECTED NOT DETECTED Final    Comment: Performed at [facility], [place] [address] [place], [place] [address]  Blood Culture (routine x 2)     Status: Abnormal   Collection Time: 12/10/17  3:30 PM  Result Value Ref Range Status   Specimen Description BLOOD RIGHT ARM  Final   Special Requests   Final    BOTTLES DRAWN AEROBIC AND ANAEROBIC Blood Culture adequate volume   Culture  Setup Time   Final    GRAM POSITIVE COCCI IN BOTH AEROBIC AND ANAEROBIC BOTTLES CRITICAL RESULT CALLED TO, READ BACK BY AND VERIFIED WITH: M. PHAM, PHARMD, AT 0845 ON 12/11/17 BY C. JESSUP, MLT.    Culture (A)  Final    STAPHYLOCOCCUS AUREUS SUSCEPTIBILITIES PERFORMED ON PREVIOUS CULTURE WITHIN THE LAST 5 DAYS. Performed at [facility], [place] [address], [place], [place] [address]    Report Status 12/13/2017 FINAL  Final  MRSA PCR Screening     Status: None   Collection Time: 12/10/17  9:26 PM  Result Value Ref Range Status   MRSA by PCR NEGATIVE NEGATIVE Final    Comment:        The GeneXpert MRSA Assay (FDA approved for NASAL specimens only), is one component of a comprehensive MRSA colonization surveillance program. It is not intended to diagnose MRSA infection nor to guide or monitor treatment for MRSA infections. Performed at [facility], [place] [address], [place],  [place] [address]   Aerobic Culture (superficial specimen)     Status: None   Collection Time: 12/11/17 10:00 AM  Result Value Ref Range Status   Specimen Description WOUND BACK  Final   Special Requests NONE  Final   Gram Stain   Final    RARE WBC PRESENT, PREDOMINANTLY PMN FEW GRAM POSITIVE COCCI Performed at [facility], [place] [address], [place], [place] [address]    Culture MODERATE STAPHYLOCOCCUS AUREUS  Final   Report Status 12/13/2017 FINAL  Final   Organism ID, Bacteria STAPHYLOCOCCUS AUREUS  Final      Susceptibility   Staphylococcus aureus - MIC*    CIPROFLOXACIN <=0.5 SENSITIVE Sensitive     ERYTHROMYCIN <=0.25 SENSITIVE Sensitive     GENTAMICIN <=0.5 SENSITIVE Sensitive     OXACILLIN 0.5 SENSITIVE Sensitive     TETRACYCLINE <=1 SENSITIVE Sensitive     VANCOMYCIN <=0.5 SENSITIVE Sensitive     TRIMETH/SULFA <=10 SENSITIVE Sensitive     CLINDAMYCIN <=0.25 SENSITIVE Sensitive     RIFAMPIN <=0.5 SENSITIVE Sensitive     Inducible Clindamycin NEGATIVE Sensitive     * MODERATE STAPHYLOCOCCUS AUREUS  Aerobic/Anaerobic Culture (surgical/deep wound)     Status: None (Preliminary result)   Collection Time: 12/11/17  4:09 PM  Result Value Ref Range Status   Specimen Description WOUND  Final   Special Requests NONE  Final   Gram Stain   Final    FEW WBC PRESENT, PREDOMINANTLY PMN FEW GRAM POSITIVE COCCI IN PAIRS IN CLUSTERS Performed at [facility], [address] [place], [place] [address]    Culture   Final    ABUNDANT STAPHYLOCOCCUS AUREUS NO ANAEROBES ISOLATED; CULTURE IN PROGRESS FOR 5 DAYS    Report Status PENDING  Incomplete   Organism ID, Bacteria STAPHYLOCOCCUS AUREUS  Final      Susceptibility   Staphylococcus aureus - MIC*    CIPROFLOXACIN <=0.5 SENSITIVE Sensitive     ERYTHROMYCIN <=0.25 SENSITIVE Sensitive     GENTAMICIN <=0.5 SENSITIVE Sensitive     OXACILLIN 0.5 SENSITIVE Sensitive     TETRACYCLINE <=1 SENSITIVE Sensitive     VANCOMYCIN <=0.5  SENSITIVE Sensitive     TRIMETH/SULFA <=10 SENSITIVE Sensitive     CLINDAMYCIN <=0.25  SENSITIVE Sensitive     RIFAMPIN <=0.5 SENSITIVE Sensitive     Inducible Clindamycin NEGATIVE Sensitive     * ABUNDANT STAPHYLOCOCCUS AUREUS  Aerobic/Anaerobic Culture (surgical/deep wound)     Status: None (Preliminary result)   Collection Time: 12/11/17  4:09 PM  Result Value Ref Range Status   Specimen Description WOUND  Final   Special Requests NONE  Final   Gram Stain   Final    FEW WBC PRESENT, PREDOMINANTLY PMN FEW GRAM POSITIVE COCCI IN PAIRS IN CLUSTERS Performed at [facility], [address], [place], [place] [address]    Culture   Final    MODERATE STAPHYLOCOCCUS AUREUS NO ANAEROBES ISOLATED; CULTURE IN PROGRESS FOR 5 DAYS    Report Status PENDING  Incomplete   Organism ID, Bacteria STAPHYLOCOCCUS AUREUS  Final      Susceptibility   Staphylococcus aureus - MIC*    CIPROFLOXACIN <=0.5 SENSITIVE Sensitive     ERYTHROMYCIN <=0.25 SENSITIVE Sensitive     GENTAMICIN <=0.5 SENSITIVE Sensitive     OXACILLIN 0.5 SENSITIVE Sensitive     TETRACYCLINE <=1 SENSITIVE Sensitive     VANCOMYCIN <=0.5 SENSITIVE Sensitive     TRIMETH/SULFA <=10 SENSITIVE Sensitive     CLINDAMYCIN <=0.25 SENSITIVE Sensitive     RIFAMPIN <=0.5 SENSITIVE Sensitive     Inducible Clindamycin NEGATIVE Sensitive     * MODERATE STAPHYLOCOCCUS AUREUS  Culture, blood (routine x 2)     Status: None (Preliminary result)   Collection Time: 12/12/17  5:25 AM  Result Value Ref Range Status   Specimen Description BLOOD LEFT HAND  Final   Special Requests   Final    BOTTLES DRAWN AEROBIC AND ANAEROBIC Blood Culture adequate volume   Culture   Final    NO GROWTH 1 DAY Performed at [facility], [address], [place], [place] [address]    Report Status PENDING  Incomplete  Culture, blood (routine x 2)     Status: None (Preliminary result)   Collection Time: 12/12/17  5:35 AM  Result Value Ref Range Status    Specimen Description BLOOD LEFT HAND  Final   Special Requests   Final    BOTTLES DRAWN AEROBIC AND ANAEROBIC Blood Culture adequate volume   Culture   Final    NO GROWTH 1 DAY Performed at [facility], [place] [address] [place], [place] [address]    Report Status PENDING  Incomplete  Culture, blood (routine x 2)     Status: None (Preliminary result)   Collection Time: 12/12/17  9:55 AM  Result Value Ref Range Status   Specimen Description BLOOD LEFT HAND  Final   Special Requests   Final    BOTTLES DRAWN AEROBIC ONLY Blood Culture results may not be optimal due to an inadequate volume of blood received in culture bottles   Culture  Setup Time   Final    GRAM POSITIVE COCCI AEROBIC BOTTLE ONLY CRITICAL VALUE NOTED.  VALUE IS CONSISTENT WITH PREVIOUSLY REPORTED AND CALLED VALUE.    Culture   Final    NO GROWTH 1 DAY Performed at [facility], [place] [address] [place], [place] [address]    Report Status PENDING  Incomplete  Culture, blood (routine x 2)     Status: None (Preliminary result)   Collection Time: 12/12/17 10:00 AM  Result Value Ref Range Status   Specimen Description BLOOD LEFT HAND  Final   Special Requests   Final  BOTTLES DRAWN AEROBIC AND ANAEROBIC Blood Culture adequate volume   Culture  Setup Time   Final    GRAM POSITIVE COCCI AEROBIC BOTTLE ONLY CRITICAL VALUE NOTED.  VALUE IS CONSISTENT WITH PREVIOUSLY REPORTED AND CALLED VALUE.    Culture   Final    NO GROWTH 1 DAY Performed at [facility], [place] [address], [place],  [address]    Report Status PENDING  Incomplete     Scheduled Meds: . heparin  5,000 Units Subcutaneous Q8H  . metoprolol succinate  25 mg Oral Daily  . metoprolol tartrate  5 mg Intravenous Once  . multivitamin with minerals  1 tablet Oral Daily  . pantoprazole  40 mg Oral Daily  . simvastatin  40 mg Oral QPM  . sodium chloride flush  3 mL Intravenous Q12H  . tamsulosin  0.4 mg Oral QPM     LOS: 3 days    Cherene Altes, MD [facility] Office  [phone] Pager - Text Page per Shea Evans  If 7PM-7AM, please contact night-coverage per Amion 12/13/2017, 3:26 PM

## 2017-12-13 NOTE — Progress Notes (Signed)
Patient currently resting at present time with Haldol given earlier, Tylenol 650 mg and Oxycodone 5 mg @ 2320. Patient starts thrashing more in the bed when he is in the bed.  However, has trouble communicating his pain level  without being directly asked. Volva ( girlfriend) has gone home to rest now that patient is resting.  Patient does speak out occasionally and then drifts back off to sleep.  Will continue to monitor closely with all bed alarms on for safety.

## 2017-12-13 NOTE — Progress Notes (Signed)
[facility]   Reason for visit: Follow up on bacteremia  Interval History: repeat blood cultures are again positive; WBC wnl, afebrile; MRI without any significant findings.  No associated rash, diarrhea.  Some confusion overnight.    Physical Exam: Constitutional:  Vitals:   12/13/17 1000 12/13/17 1154  BP: (!) 164/96 (!) 153/93  Pulse: (!) 104 (!) 106  Resp:  19  Temp:  98 F (36.7 C)  SpO2:  100%   patient appears in NAD Respiratory: Normal respiratory effort; CTA B Cardiovascular: RRR GI: soft, nt, nd  Review of Systems: Constitutional: negative for malaise Integument/breast: negative for rash  Lab Results  Component Value Date   WBC 7.9 12/13/2017   HGB 11.6 (L) 12/13/2017   HCT 36.7 (L) 12/13/2017   MCV 89.1 12/13/2017   PLT 112 (L) 12/13/2017    Lab Results  Component Value Date   CREATININE 1.56 (H) 12/13/2017   BUN 26 (H) 12/13/2017   NA 139 12/13/2017   K 3.6 12/13/2017   CL 110 12/13/2017   CO2 24 12/13/2017    Lab Results  Component Value Date   ALT 32 12/10/2017   AST 58 (H) 12/10/2017   ALKPHOS 48 12/10/2017     Microbiology: Recent Results (from the past 240 hour(s))  Blood Culture (routine x 2)     Status: Abnormal   Collection Time: 12/10/17  2:43 PM  Result Value Ref Range Status   Specimen Description BLOOD RIGHT ANTECUBITAL  Final   Special Requests   Final    BOTTLES DRAWN AEROBIC AND ANAEROBIC Blood Culture adequate volume   Culture  Setup Time   Final    GRAM POSITIVE COCCI IN BOTH AEROBIC AND ANAEROBIC BOTTLES CRITICAL RESULT CALLED TO, READ BACK BY AND VERIFIED WITH: M. PHAM, PHARMD AT 0845 ON 12/11/17 BY C. JESSUP, MLT. Performed at [facility], [place] [address], [place], [place] [address]    Culture STAPHYLOCOCCUS AUREUS (A)  Final   Report Status 12/13/2017 FINAL  Final   Organism ID, Bacteria STAPHYLOCOCCUS AUREUS  Final      Susceptibility   Staphylococcus aureus - MIC*   CIPROFLOXACIN <=0.5 SENSITIVE Sensitive     ERYTHROMYCIN <=0.25 SENSITIVE Sensitive     GENTAMICIN <=0.5 SENSITIVE Sensitive     OXACILLIN <=0.25 SENSITIVE Sensitive     TETRACYCLINE <=1 SENSITIVE Sensitive     VANCOMYCIN <=0.5 SENSITIVE Sensitive     TRIMETH/SULFA <=10 SENSITIVE Sensitive     CLINDAMYCIN <=0.25 SENSITIVE Sensitive     RIFAMPIN <=0.5 SENSITIVE Sensitive     Inducible Clindamycin NEGATIVE Sensitive     * STAPHYLOCOCCUS AUREUS  Blood Culture ID Panel (Reflexed)     Status: Abnormal   Collection Time: 12/10/17  2:43 PM  Result Value Ref Range Status   Enterococcus species NOT DETECTED NOT DETECTED Final   Listeria monocytogenes NOT DETECTED NOT DETECTED Final   Staphylococcus species DETECTED (A) NOT DETECTED Final    Comment: CRITICAL RESULT CALLED TO, READ BACK BY AND VERIFIED WITH: M. PHAM, PHARMD AT 0845 ON 12/11/17 BY C. JESSUP, MLT.    Staphylococcus aureus (BCID) DETECTED (A) NOT DETECTED Final    Comment: Methicillin (oxacillin) susceptible Staphylococcus aureus (MSSA). Preferred therapy is anti staphylococcal beta lactam antibiotic (Cefazolin or Nafcillin), unless clinically contraindicated. CRITICAL RESULT CALLED TO, READ BACK BY AND VERIFIED WITH: M. PHAM, PHARMD AT 0845 ON 12/11/17 BY C. JESSUP, MLT.    Methicillin resistance NOT DETECTED NOT  DETECTED Final   Streptococcus species NOT DETECTED NOT DETECTED Final   Streptococcus agalactiae NOT DETECTED NOT DETECTED Final   Streptococcus pneumoniae NOT DETECTED NOT DETECTED Final   Streptococcus pyogenes NOT DETECTED NOT DETECTED Final   Acinetobacter baumannii NOT DETECTED NOT DETECTED Final   Enterobacteriaceae species NOT DETECTED NOT DETECTED Final   Enterobacter cloacae complex NOT DETECTED NOT DETECTED Final   Escherichia coli NOT DETECTED NOT DETECTED Final   Klebsiella oxytoca NOT DETECTED NOT DETECTED Final   Klebsiella pneumoniae NOT DETECTED NOT DETECTED Final   Proteus species NOT DETECTED NOT  DETECTED Final   Serratia marcescens NOT DETECTED NOT DETECTED Final   Haemophilus influenzae NOT DETECTED NOT DETECTED Final   Neisseria meningitidis NOT DETECTED NOT DETECTED Final   Pseudomonas aeruginosa NOT DETECTED NOT DETECTED Final   Candida albicans NOT DETECTED NOT DETECTED Final   Candida glabrata NOT DETECTED NOT DETECTED Final   Candida krusei NOT DETECTED NOT DETECTED Final   Candida parapsilosis NOT DETECTED NOT DETECTED Final   Candida tropicalis NOT DETECTED NOT DETECTED Final    Comment: Performed at [facility], [place] [address] [place], [place] [address]  Blood Culture (routine x 2)     Status: Abnormal   Collection Time: 12/10/17  3:30 PM  Result Value Ref Range Status   Specimen Description BLOOD RIGHT ARM  Final   Special Requests   Final    BOTTLES DRAWN AEROBIC AND ANAEROBIC Blood Culture adequate volume   Culture  Setup Time   Final    GRAM POSITIVE COCCI IN BOTH AEROBIC AND ANAEROBIC BOTTLES CRITICAL RESULT CALLED TO, READ BACK BY AND VERIFIED WITH: M. PHAM, PHARMD, AT 0845 ON 12/11/17 BY C. JESSUP, MLT.    Culture (A)  Final    STAPHYLOCOCCUS AUREUS SUSCEPTIBILITIES PERFORMED ON PREVIOUS CULTURE WITHIN THE LAST 5 DAYS. Performed at [facility], [place] [address] [place], [place] [address]    Report Status 12/13/2017 FINAL  Final  MRSA PCR Screening     Status: None   Collection Time: 12/10/17  9:26 PM  Result Value Ref Range Status   MRSA by PCR NEGATIVE NEGATIVE Final    Comment:        The GeneXpert MRSA Assay (FDA approved for NASAL specimens only), is one component of a comprehensive MRSA colonization surveillance program. It is not intended to diagnose MRSA infection nor to guide or monitor treatment for MRSA infections. Performed at [facility], [place] [address], [place], [place] [address]   Aerobic Culture (superficial specimen)     Status: None (Preliminary result)   Collection Time: 12/11/17 10:00 AM  Result Value Ref  Range Status   Specimen Description WOUND BACK  Final   Special Requests NONE  Final   Gram Stain   Final    RARE WBC PRESENT, PREDOMINANTLY PMN FEW GRAM POSITIVE COCCI Performed at [facility], [place] [address], [place], [place] [address]    Culture MODERATE STAPHYLOCOCCUS AUREUS  Final   Report Status PENDING  Incomplete  Aerobic/Anaerobic Culture (surgical/deep wound)     Status: None (Preliminary result)   Collection Time: 12/11/17  4:09 PM  Result Value Ref Range Status   Specimen Description WOUND  Final   Special Requests NONE  Final   Gram Stain   Final    FEW WBC PRESENT, PREDOMINANTLY PMN FEW GRAM POSITIVE COCCI IN PAIRS IN CLUSTERS Performed at [facility], [place],  [place] [address]    Culture   Final    ABUNDANT STAPHYLOCOCCUS AUREUS NO ANAEROBES ISOLATED; CULTURE IN PROGRESS FOR 5 DAYS    Report Status PENDING  Incomplete   Organism ID, Bacteria STAPHYLOCOCCUS AUREUS  Final      Susceptibility   Staphylococcus aureus - MIC*    CIPROFLOXACIN <=0.5 SENSITIVE Sensitive     ERYTHROMYCIN <=0.25 SENSITIVE Sensitive     GENTAMICIN <=0.5 SENSITIVE Sensitive     OXACILLIN 0.5 SENSITIVE Sensitive     TETRACYCLINE <=1 SENSITIVE Sensitive     VANCOMYCIN <=0.5 SENSITIVE Sensitive     TRIMETH/SULFA <=10 SENSITIVE Sensitive     CLINDAMYCIN <=0.25 SENSITIVE Sensitive     RIFAMPIN <=0.5 SENSITIVE Sensitive     Inducible Clindamycin NEGATIVE Sensitive     * ABUNDANT STAPHYLOCOCCUS AUREUS  Aerobic/Anaerobic Culture (surgical/deep wound)     Status: None (Preliminary result)   Collection Time: 12/11/17  4:09 PM  Result Value Ref Range Status   Specimen Description WOUND  Final   Special Requests NONE  Final   Gram Stain   Final    FEW WBC PRESENT, PREDOMINANTLY PMN FEW GRAM POSITIVE COCCI IN PAIRS IN CLUSTERS Performed at [facility], [place] [address] [place], [place] [address]    Culture   Final    MODERATE STAPHYLOCOCCUS AUREUS NO  ANAEROBES ISOLATED; CULTURE IN PROGRESS FOR 5 DAYS    Report Status PENDING  Incomplete   Organism ID, Bacteria STAPHYLOCOCCUS AUREUS  Final      Susceptibility   Staphylococcus aureus - MIC*    CIPROFLOXACIN <=0.5 SENSITIVE Sensitive     ERYTHROMYCIN <=0.25 SENSITIVE Sensitive     GENTAMICIN <=0.5 SENSITIVE Sensitive     OXACILLIN 0.5 SENSITIVE Sensitive     TETRACYCLINE <=1 SENSITIVE Sensitive     VANCOMYCIN <=0.5 SENSITIVE Sensitive     TRIMETH/SULFA <=10 SENSITIVE Sensitive     CLINDAMYCIN <=0.25 SENSITIVE Sensitive     RIFAMPIN <=0.5 SENSITIVE Sensitive     Inducible Clindamycin NEGATIVE Sensitive     * MODERATE STAPHYLOCOCCUS AUREUS  Culture, blood (routine x 2)     Status: None (Preliminary result)   Collection Time: 12/12/17  9:55 AM  Result Value Ref Range Status   Specimen Description BLOOD LEFT HAND  Final   Special Requests   Final    BOTTLES DRAWN AEROBIC ONLY Blood Culture results may not be optimal due to an inadequate volume of blood received in culture bottles   Culture  Setup Time   Final    GRAM POSITIVE COCCI AEROBIC BOTTLE ONLY CRITICAL VALUE NOTED.  VALUE IS CONSISTENT WITH PREVIOUSLY REPORTED AND CALLED VALUE. Performed at [facility], [place] [address] [place], [place] [address]    Culture PENDING  Incomplete   Report Status PENDING  Incomplete  Culture, blood (routine x 2)     Status: None (Preliminary result)   Collection Time: 12/12/17 10:00 AM  Result Value Ref Range Status   Specimen Description BLOOD LEFT HAND  Final   Special Requests   Final    BOTTLES DRAWN AEROBIC AND ANAEROBIC Blood Culture adequate volume   Culture  Setup Time   Final    GRAM POSITIVE COCCI AEROBIC BOTTLE ONLY CRITICAL VALUE NOTED.  VALUE IS CONSISTENT WITH PREVIOUSLY REPORTED AND CALLED VALUE. Performed at [facility], [place] [address], [place], [place] [address]    Culture PENDING  Incomplete   Report Status PENDING  Incomplete    Impression/Plan:  1.  Bacteremia - repeat cultures are again positive, though done day after surgery so not unexpected.  Will repeat tomorrow.  MSSA and tolerating nafcillin.   2.  Post op infection - cultures with same MSSA as in blood.  Will need a prolonged treatment duration.    3.  Access - will repeat blood cultures and consider picc line Monday if they remain negative.

## 2017-12-13 NOTE — Progress Notes (Addendum)
Neurology Progress Note   S:// Rapid response called to MRI yesterday after patient became unresponsive. On rapid response arrival he was easy to arouse to voice and physical stimuli. Noted to be agitated, restless, and disoriented. MRI was canceled. Exhibited signs of paranoia and rambling speech, said he thought the staff was trying to kill him. Vitals were stable.  He was given IV haldol x 1 and safety sitter to bedside. Noted to be thrashing in bed later that night.   This morning, he is alert and oriented. Does not have a clear memory of last night's events. Denies anxiety or claustrophobia with tight spaces. Agreeable to going for MRI today.    O:// Current vital signs: BP 140/83 (BP Location: Right Arm)   Pulse 79   Temp 97.6 F (36.4 C) (Oral)   Resp 14   Ht 6\' 2"  (1.88 m)   Wt 105.7 kg   SpO2 96%   BMI 29.92 kg/m  Vital signs in last 24 hours: Temp:  [97.6 F (36.4 C)-99.9 F (37.7 C)] 97.6 F (36.4 C) (11/08 0756) Pulse Rate:  [79-114] 79 (11/08 0756) Resp:  [14-28] 14 (11/08 0756) BP: (123-145)/(58-97) 140/83 (11/08 0756) SpO2:  [91 %-99 %] 96 % (11/08 0756) GENERAL: Awake, alert in NAD HEENT: - Normocephalic and atraumatic, dry mm, no LN++, no Thyromegally LUNGS - Breathing comfortably on RA, normal respiratory rate CV - Tachycardic but regular Ext: warm, well perfused, no edema NEURO:  Mental Status: AA&Ox3  Language: speech is normal.  Naming, repetition, fluency, and comprehension intact. Cranial Nerves: PERRL 3 mm/brisk. EOMI, visual fields full, no facial asymmetry, hearing intact, tongue/uvula/soft palate midline, No evidence of tongue atrophy or fibrillations Motor: Intact and symmetric bilaterally  Tone: is normal and bulk is normal Sensation- Intact to light touch bilaterally Coordination: FTN intact bilaterally, no ataxia in BLE. Gait- deferred  Medications  Current Facility-Administered Medications:  .  0.9 %  sodium chloride infusion, ,  Intravenous, Continuous, Rizwan, Saima, MD, Last Rate: 75 mL/hr at 12/12/17 1800 .  0.9 %  sodium chloride infusion, , Intravenous, Continuous, Costella, Vincent J, PA-C .  0.9 %  sodium chloride infusion, 250 mL, Intravenous, Continuous, Costella, Vincent J, PA-C, Last Rate: 1 mL/hr at 12/12/17 0114, 250 mL at 12/12/17 0114 .  acetaminophen (TYLENOL) tablet 650 mg, 650 mg, Oral, Q4H PRN, 650 mg at 12/13/17 0434 **OR** acetaminophen (TYLENOL) suppository 650 mg, 650 mg, Rectal, Q4H PRN, Costella, Vincent J, PA-C .  heparin injection 5,000 Units, 5,000 Units, Subcutaneous, Q8H, Janora Norlander, MD, 5,000 Units at 12/13/17 0630 .  HYDROcodone-acetaminophen (NORCO/VICODIN) 5-325 MG per tablet 1 tablet, 1 tablet, Oral, Q4H PRN, Costella, Vista Mink, PA-C, 1 tablet at 12/11/17 1833 .  HYDROmorphone (DILAUDID) injection 0.5-1 mg, 0.5-1 mg, Intravenous, Q2H PRN, Costella, Vincent J, PA-C .  lactated ringers infusion, , Intravenous, Continuous, Belinda Block, MD, Stopped at 12/11/17 1810 .  menthol-cetylpyridinium (CEPACOL) lozenge 3 mg, 1 lozenge, Oral, PRN **OR** phenol (CHLORASEPTIC) mouth spray 1 spray, 1 spray, Mouth/Throat, PRN, Costella, Vincent J, PA-C .  methocarbamol (ROBAXIN) tablet 500 mg, 500 mg, Oral, Q6H PRN **OR** methocarbamol (ROBAXIN) 500 mg in dextrose 5 % 50 mL IVPB, 500 mg, Intravenous, Q6H PRN, Costella, Vincent J, PA-C .  metoprolol succinate (TOPROL-XL) 24 hr tablet 25 mg, 25 mg, Oral, Daily, Rizwan, Saima, MD, 25 mg at 12/12/17 0932 .  metoprolol tartrate (LOPRESSOR) injection 5 mg, 5 mg, Intravenous, Once, Schorr, Rhetta Mura, NP, Stopped at 12/11/17 0153 .  multivitamin with minerals tablet 1 tablet, 1 tablet, Oral, Daily, Janora Norlander, MD, 1 tablet at 12/12/17 0932 .  nafcillin 2 g in sodium chloride 0.9 % 100 mL IVPB, 2 g, Intravenous, Q4H, Thayer Headings, MD, Last Rate: 200 mL/hr at 12/13/17 0630, 2 g at 12/13/17 0630 .  nitroGLYCERIN (NITROSTAT) SL tablet 0.4 mg, 0.4 mg,  Sublingual, Q5 min PRN, Janora Norlander, MD .  ondansetron [facility]) tablet 4 mg, 4 mg, Oral, Q6H PRN **OR** ondansetron (ZOFRAN) injection 4 mg, 4 mg, Intravenous, Q6H PRN, Costella, Vincent J, PA-C .  oxyCODONE (Oxy IR/ROXICODONE) immediate release tablet 5-10 mg, 5-10 mg, Oral, Q3H PRN, Costella, Vincent J, PA-C, 5 mg at 12/13/17 0434 .  pantoprazole (PROTONIX) EC tablet 40 mg, 40 mg, Oral, Daily, Janora Norlander, MD, 40 mg at 12/12/17 0932 .  simvastatin (ZOCOR) tablet 40 mg, 40 mg, Oral, QPM, Janora Norlander, MD, 40 mg at 12/12/17 1722 .  sodium chloride flush (NS) 0.9 % injection 3 mL, 3 mL, Intravenous, Q12H, Costella, Vincent J, PA-C, 3 mL at 12/12/17 2329 .  sodium chloride flush (NS) 0.9 % injection 3 mL, 3 mL, Intravenous, PRN, Costella, Vincent J, PA-C .  sodium phosphate (FLEET) 7-19 GM/118ML enema 1 enema, 1 enema, Rectal, Once PRN, Costella, Vincent J, PA-C .  tamsulosin (FLOMAX) capsule 0.4 mg, 0.4 mg, Oral, QPM, Janora Norlander, MD, 0.4 mg at 12/12/17 1722 .  zolpidem (AMBIEN) tablet 5 mg, 5 mg, Oral, QHS PRN, Traci Sermon, PA-C Labs CBC    Component Value Date/Time   WBC 7.9 12/13/2017 0402   RBC 4.12 (L) 12/13/2017 0402   HGB 11.6 (L) 12/13/2017 0402   HCT 36.7 (L) 12/13/2017 0402   PLT 112 (L) 12/13/2017 0402   MCV 89.1 12/13/2017 0402   MCH 28.2 12/13/2017 0402   MCHC 31.6 12/13/2017 0402   RDW 13.8 12/13/2017 0402   LYMPHSABS 0.8 12/10/2017 1418   MONOABS 0.9 12/10/2017 1418   EOSABS 0.0 12/10/2017 1418   BASOSABS 0.0 12/10/2017 1418    CMP     Component Value Date/Time   NA 139 12/13/2017 0402   K 3.6 12/13/2017 0402   CL 110 12/13/2017 0402   CO2 24 12/13/2017 0402   GLUCOSE 118 (H) 12/13/2017 0402   BUN 26 (H) 12/13/2017 0402   CREATININE 1.56 (H) 12/13/2017 0402   CREATININE 1.11 01/18/2016 0844   CALCIUM 9.1 12/13/2017 0402   PROT 7.0 12/10/2017 1418   ALBUMIN 2.9 (L) 12/10/2017 1418   AST 58 (H) 12/10/2017 1418   ALT 32 12/10/2017 1418    ALKPHOS 48 12/10/2017 1418   BILITOT 1.4 (H) 12/10/2017 1418   GFRNONAA 43 (L) 12/13/2017 0402   GFRAA 49 (L) 12/13/2017 0402    glycosylated hemoglobin  Lipid Panel     Component Value Date/Time   CHOL 169 12/05/2016 1039   TRIG 87.0 12/05/2016 1039   HDL 61.10 12/05/2016 1039   CHOLHDL 3 12/05/2016 1039   VLDL 17.4 12/05/2016 1039   LDLCALC 91 12/05/2016 1039     Imaging I have reviewed images in epic and the results pertinent to this consultation are:  CT-scan of the brain IMPRESSION: 1. Sinus disease as above. 2. No acute intracranial abnormalities.  MRI examination of the brain - Pending  Assessment: 72 yo M admitted for washout after an infected microdiskectomy that was done 1 week prior. Became altered on post op day 1. Mental status appears to be waxing and waning  with intermittent agitation. Alert and oriented this morning.   Impression: Delirium; likely multifactorial in the setting of infection, recent surgery, sedating medications, and hospital setting. Needs MRI to rule out septic emboli given his bacteremia. Neurologically intact.   Recommendations: -- F/u MRI; scheduled today - will follow -- Limit sedating medications -- Delirium precautions, day / night re orientation  -- Continue abx for infection per primary    Velna Ochs, M.D. - PGY3 12/13/2017, 9:03 AM    Attending Neurohospitalist Addendum Patient seen and examined with APP/Resident. Agree with the history and physical as documented above. Agree with the plan as documented, which I helped formulate. I have independently reviewed the chart, obtained history, review of systems and examined the patient.I have personally reviewed pertinent head/neck/spine imaging (CT/MRI).  MRI pending - will follow MRI once completed   Please feel free to call with any questions. --- Amie Portland, MD [facility] Pager: [phone]  If 7pm to 7am, please call on call as listed on  AMION.

## 2017-12-13 NOTE — Progress Notes (Signed)
OT Cancellation Note  Patient Details Name: Tyrone Roman MRN: [id number] DOB: 12/21/1945   Cancelled Treatment:    Reason Eval/Treat Not Completed: Fatigue/lethargy limiting ability to participate; pt sleeping soundly upon entering room, arouses when spoken to but unable to maintain eyes open when speaking with therapist and falling back asleep. Will follow up as schedule permits.   Lou Cal, OT [facility] Pager [phone] Office [phone]   Raymondo Band 12/13/2017, 2:52 PM

## 2017-12-13 NOTE — Progress Notes (Signed)
MRI brain shows no acute changes. Recs as in earlier note from today. Please call with questions.  -- Amie Portland, MD [facility] Pager: [phone] If 7pm to 7am, please call on call as listed on AMION.

## 2017-12-14 LAB — COMPREHENSIVE METABOLIC PANEL
ALT: 27 U/L (ref 0–44)
AST: 35 U/L (ref 15–41)
Albumin: 1.6 g/dL — ABNORMAL LOW (ref 3.5–5.0)
Alkaline Phosphatase: 68 U/L (ref 38–126)
Anion gap: 7 (ref 5–15)
BUN: 23 mg/dL (ref 8–23)
CO2: 23 mmol/L (ref 22–32)
Calcium: 8.8 mg/dL — ABNORMAL LOW (ref 8.9–10.3)
Chloride: 109 mmol/L (ref 98–111)
Creatinine, Ser: 1.54 mg/dL — ABNORMAL HIGH (ref 0.61–1.24)
GFR calc Af Amer: 50 mL/min — ABNORMAL LOW (ref >60)
GFR calc non Af Amer: 43 mL/min — ABNORMAL LOW (ref >60)
Glucose, Bld: 138 mg/dL — ABNORMAL HIGH (ref 70–99)
Potassium: 3 mmol/L — ABNORMAL LOW (ref 3.5–5.1)
Sodium: 139 mmol/L (ref 135–145)
Total Bilirubin: 1.9 mg/dL — ABNORMAL HIGH (ref 0.3–1.2)
Total Protein: 4.9 g/dL — ABNORMAL LOW (ref 6.5–8.1)

## 2017-12-14 LAB — CBC
HCT: 33.5 % — ABNORMAL LOW (ref 39.0–52.0)
Hemoglobin: 10.6 g/dL — ABNORMAL LOW (ref 13.0–17.0)
MCH: 28 pg (ref 26.0–34.0)
MCHC: 31.6 g/dL (ref 30.0–36.0)
MCV: 88.4 fL (ref 80.0–100.0)
Platelets: 164 10*3/uL (ref 150–400)
RBC: 3.79 MIL/uL — ABNORMAL LOW (ref 4.22–5.81)
RDW: 14 % (ref 11.5–15.5)
WBC: 8.5 10*3/uL (ref 4.0–10.5)
nRBC: 0 % (ref 0.0–0.2)

## 2017-12-14 LAB — CULTURE, BLOOD (ROUTINE X 2)

## 2017-12-14 LAB — VITAMIN B12: Vitamin B-12: 1348 pg/mL — ABNORMAL HIGH (ref 180–914)

## 2017-12-14 LAB — FOLATE: Folate: 18.5 ng/mL (ref >5.9)

## 2017-12-14 LAB — AMMONIA: Ammonia: 41 umol/L — ABNORMAL HIGH (ref 9–35)

## 2017-12-14 MED ORDER — HYDRALAZINE HCL 20 MG/ML IJ SOLN
5.0000 mg | INTRAMUSCULAR | Status: DC | PRN
  Administered 2017-12-14 – 2017-12-15 (×2): 5 mg via INTRAVENOUS
  Filled 2017-12-14 (×2): qty 1

## 2017-12-14 MED ORDER — ACETAMINOPHEN 325 MG PO TABS
650.0000 mg | ORAL_TABLET | Freq: Four times a day (QID) | ORAL | Status: DC | PRN
  Administered 2017-12-14 – 2017-12-16 (×6): 650 mg via ORAL
  Filled 2017-12-14 (×6): qty 2

## 2017-12-14 MED ORDER — POTASSIUM CHLORIDE CRYS ER 20 MEQ PO TBCR
40.0000 meq | EXTENDED_RELEASE_TABLET | Freq: Two times a day (BID) | ORAL | Status: AC
  Administered 2017-12-14 – 2017-12-15 (×3): 40 meq via ORAL
  Filled 2017-12-14 (×3): qty 2

## 2017-12-14 NOTE — Progress Notes (Signed)
Tyrone Roman  Tyrone Roman  OMV:[id number] DOB: [birth year] DOA: 12/10/2017 PCP: [name], MD    Brief Narrative:  72 y.o.malew/ a hx of RA not on meds and a lumbarmicrodiscetcomy 12/06/17 by Dr. Kathyrn Sheriff who presented to the ED with c/oprogressive back pain 5/10 at rest and 10/10 with movement,AMS, and increased weakness of his LLE.  Subjective: Dramatically improved today. A&O x4. Reports pain is well controlled. In good spirits. Denies cp, sob, n/v, or abdom pain. Asking when he will be D/C.   Assessment & Plan:  Severe Sepsis - MSSA bacteremia due to lumbar wound infection ID managing antibiotics - wound I&D 11/6 - wound looks good per NS   HNP s/p s/p microdiscectomy 12/06/17 NS following   Acute toxic metabolic encephalopathy likely multifactorial in the setting of infection, recent surgery, sedating medications, and hospital setting - MRI brain w/o acute findings - attempt to avoid sedatives as able - resolved at this time    AKI  baseline ~ 0.9-1.0 - appears renal fxn is slowly improving - cont to monitor   Recent Labs  Lab 12/10/17 1418 12/11/17 0500 12/12/17 0512 12/13/17 0402 12/14/17 0554  CREATININE 2.27* 1.71* 1.38* 1.56* 1.54*    Rheumatoid arthritis was on Cimza injections in the past but on no focused tx of late   Coronary artery disease    cont statin - ASA on hold  HTN No change in tx today - follow trend  GERD  COPD    DVT prophylaxis: SCDs Code Status: FULL CODE Family Communication: no family present at time of exam today  Disposition Plan: SDU  Consultants:  ID NS Neuro  Antimicrobials:  Nafcillin 11/7 > Aztreonam 11/5 Vanc 11/5 > 11/6   Objective: Blood pressure (!) 147/93, pulse (!) 102, temperature 97.8 F (36.6 C), temperature source Oral, resp. rate (!) 21, height 6\' 2"  (1.88 m), weight 105.7 kg, SpO2 96 %.  Intake/Output Summary (Last 24 hours) at 12/14/2017 1450 Last data filed at  12/14/2017 1203 Gross per 24 hour  Intake 1209.41 ml  Output 3215 ml  Net -2005.59 ml   Filed Weights   12/10/17 1401  Weight: 105.7 kg    Examination: General: NAD - alert and conversant  Lungs: Clear to auscultation bilaterally - no wheezing  Cardiovascular: RRR  Abdomen: Nondistended, soft, bowel sounds positive Extremities: No signif edema bilateral lower extremities  CBC: Recent Labs  Lab 12/10/17 1418  12/12/17 0512 12/13/17 0402 12/14/17 0554  WBC 5.2   < > 6.0 7.9 8.5  NEUTROABS 3.5  --   --   --   --   HGB 16.2   < > 11.9* 11.6* 10.6*  HCT 49.6   < > 36.7* 36.7* 33.5*  MCV 89.4   < > 89.1 89.1 88.4  PLT 150   < > 99* 112* 164   < > = values in this interval not displayed.   Basic Metabolic Panel: Recent Labs  Lab 12/12/17 0512 12/13/17 0402 12/14/17 0554  NA 139 139 139  K 3.8 3.6 3.0*  CL 110 110 109  CO2 24 24 23   GLUCOSE 124* 118* 138*  BUN 31* 26* 23  CREATININE 1.38* 1.56* 1.54*  CALCIUM 9.3 9.1 8.8*   GFR: Estimated Creatinine Clearance: 56.2 mL/min (A) (by C-G formula based on SCr of 1.54 mg/dL (H)).  Liver Function Tests: Recent Labs  Lab 12/10/17 1418 12/14/17 0554  AST 58* 35  ALT 32 27  ALKPHOS 48 68  BILITOT 1.4* 1.9*  PROT 7.0 4.9*  ALBUMIN 2.9* 1.6*    Recent Labs  Lab 12/10/17 1832 12/14/17 0556  AMMONIA 20 41*    CBG: Recent Labs  Lab 12/10/17 1416  GLUCAP 174*    Recent Results (from the past 240 hour(s))  Blood Culture (routine x 2)     Status: Abnormal   Collection Time: 12/10/17  2:43 PM  Result Value Ref Range Status   Specimen Description BLOOD RIGHT ANTECUBITAL  Final   Special Requests   Final    BOTTLES DRAWN AEROBIC AND ANAEROBIC Blood Culture adequate volume   Culture  Setup Time   Final    GRAM POSITIVE COCCI IN BOTH AEROBIC AND ANAEROBIC BOTTLES CRITICAL RESULT CALLED TO, READ BACK BY AND VERIFIED WITH: M. PHAM, PHARMD AT 0845 ON 12/11/17 BY C. JESSUP, MLT. Performed at [facility], [place] [address], [place], [place] [address]    Culture STAPHYLOCOCCUS AUREUS (A)  Final   Report Status 12/13/2017 FINAL  Final   Organism ID, Bacteria STAPHYLOCOCCUS AUREUS  Final      Susceptibility   Staphylococcus aureus - MIC*    CIPROFLOXACIN <=0.5 SENSITIVE Sensitive     ERYTHROMYCIN <=0.25 SENSITIVE Sensitive     GENTAMICIN <=0.5 SENSITIVE Sensitive     OXACILLIN <=0.25 SENSITIVE Sensitive     TETRACYCLINE <=1 SENSITIVE Sensitive     VANCOMYCIN <=0.5 SENSITIVE Sensitive     TRIMETH/SULFA <=10 SENSITIVE Sensitive     CLINDAMYCIN <=0.25 SENSITIVE Sensitive     RIFAMPIN <=0.5 SENSITIVE Sensitive     Inducible Clindamycin NEGATIVE Sensitive     * STAPHYLOCOCCUS AUREUS  Blood Culture ID Panel (Reflexed)     Status: Abnormal   Collection Time: 12/10/17  2:43 PM  Result Value Ref Range Status   Enterococcus species NOT DETECTED NOT DETECTED Final   Listeria monocytogenes NOT DETECTED NOT DETECTED Final   Staphylococcus species DETECTED (A) NOT DETECTED Final    Comment: CRITICAL RESULT CALLED TO, READ BACK BY AND VERIFIED WITH: M. PHAM, PHARMD AT 0845 ON 12/11/17 BY C. JESSUP, MLT.    Staphylococcus aureus (BCID) DETECTED (A) NOT DETECTED Final    Comment: Methicillin (oxacillin) susceptible Staphylococcus aureus (MSSA). Preferred therapy is anti staphylococcal beta lactam antibiotic (Cefazolin or Nafcillin), unless clinically contraindicated. CRITICAL RESULT CALLED TO, READ BACK BY AND VERIFIED WITH: M. PHAM, PHARMD AT 0845 ON 12/11/17 BY C. JESSUP, MLT.    Methicillin resistance NOT DETECTED NOT DETECTED Final   Streptococcus species NOT DETECTED NOT DETECTED Final   Streptococcus agalactiae NOT DETECTED NOT DETECTED Final   Streptococcus pneumoniae NOT DETECTED NOT DETECTED Final   Streptococcus pyogenes NOT DETECTED NOT DETECTED Final   Acinetobacter baumannii NOT DETECTED NOT DETECTED Final   Enterobacteriaceae species NOT DETECTED NOT DETECTED Final   Enterobacter cloacae  complex NOT DETECTED NOT DETECTED Final   Escherichia coli NOT DETECTED NOT DETECTED Final   Klebsiella oxytoca NOT DETECTED NOT DETECTED Final   Klebsiella pneumoniae NOT DETECTED NOT DETECTED Final   Proteus species NOT DETECTED NOT DETECTED Final   Serratia marcescens NOT DETECTED NOT DETECTED Final   Haemophilus influenzae NOT DETECTED NOT DETECTED Final   Neisseria meningitidis NOT DETECTED NOT DETECTED Final   Pseudomonas aeruginosa NOT DETECTED NOT DETECTED Final   Candida albicans NOT DETECTED NOT DETECTED Final   Candida glabrata NOT DETECTED NOT DETECTED Final   Candida krusei NOT DETECTED NOT DETECTED Final   Candida parapsilosis NOT  DETECTED NOT DETECTED Final   Candida tropicalis NOT DETECTED NOT DETECTED Final    Comment: Performed at [facility], [place] [address] [place] [address], [place], [place] [address]  Blood Culture (routine x 2)     Status: Abnormal   Collection Time: 12/10/17  3:30 PM  Result Value Ref Range Status   Specimen Description BLOOD RIGHT ARM  Final   Special Requests   Final    BOTTLES DRAWN AEROBIC AND ANAEROBIC Blood Culture adequate volume   Culture  Setup Time   Final    GRAM POSITIVE COCCI IN BOTH AEROBIC AND ANAEROBIC BOTTLES CRITICAL RESULT CALLED TO, READ BACK BY AND VERIFIED WITH: M. PHAM, PHARMD, AT 0845 ON 12/11/17 BY C. JESSUP, MLT.    Culture (A)  Final    STAPHYLOCOCCUS AUREUS SUSCEPTIBILITIES PERFORMED ON PREVIOUS CULTURE WITHIN THE LAST 5 DAYS. Performed at [facility], [place] [address], [place], [place] [address]    Report Status 12/13/2017 FINAL  Final  MRSA PCR Screening     Status: None   Collection Time: 12/10/17  9:26 PM  Result Value Ref Range Status   MRSA by PCR NEGATIVE NEGATIVE Final    Comment:        The GeneXpert MRSA Assay (FDA approved for NASAL specimens only), is one component of a comprehensive MRSA colonization surveillance program. It is not intended to diagnose MRSA infection nor to guide or monitor treatment  for MRSA infections. Performed at [facility], [place] [address], [place], [place] [address]   Aerobic Culture (superficial specimen)     Status: None   Collection Time: 12/11/17 10:00 AM  Result Value Ref Range Status   Specimen Description WOUND BACK  Final   Special Requests NONE  Final   Gram Stain   Final    RARE WBC PRESENT, PREDOMINANTLY PMN FEW GRAM POSITIVE COCCI Performed at [facility], [place] [address], [place], [place] [address]    Culture MODERATE STAPHYLOCOCCUS AUREUS  Final   Report Status 12/13/2017 FINAL  Final   Organism ID, Bacteria STAPHYLOCOCCUS AUREUS  Final      Susceptibility   Staphylococcus aureus - MIC*    CIPROFLOXACIN <=0.5 SENSITIVE Sensitive     ERYTHROMYCIN <=0.25 SENSITIVE Sensitive     GENTAMICIN <=0.5 SENSITIVE Sensitive     OXACILLIN 0.5 SENSITIVE Sensitive     TETRACYCLINE <=1 SENSITIVE Sensitive     VANCOMYCIN <=0.5 SENSITIVE Sensitive     TRIMETH/SULFA <=10 SENSITIVE Sensitive     CLINDAMYCIN <=0.25 SENSITIVE Sensitive     RIFAMPIN <=0.5 SENSITIVE Sensitive     Inducible Clindamycin NEGATIVE Sensitive     * MODERATE STAPHYLOCOCCUS AUREUS  Aerobic/Anaerobic Culture (surgical/deep wound)     Status: None (Preliminary result)   Collection Time: 12/11/17  4:09 PM  Result Value Ref Range Status   Specimen Description WOUND  Final   Special Requests NONE  Final   Gram Stain   Final    FEW WBC PRESENT, PREDOMINANTLY PMN FEW GRAM POSITIVE COCCI IN PAIRS IN CLUSTERS Performed at [facility], [address] [place], [place] [address]    Culture   Final    ABUNDANT STAPHYLOCOCCUS AUREUS NO ANAEROBES ISOLATED; CULTURE IN PROGRESS FOR 5 DAYS    Report Status PENDING  Incomplete   Organism ID, Bacteria STAPHYLOCOCCUS AUREUS  Final      Susceptibility   Staphylococcus aureus - MIC*    CIPROFLOXACIN <=0.5 SENSITIVE Sensitive  ERYTHROMYCIN <=0.25 SENSITIVE Sensitive     GENTAMICIN <=0.5 SENSITIVE Sensitive     OXACILLIN  0.5 SENSITIVE Sensitive     TETRACYCLINE <=1 SENSITIVE Sensitive     VANCOMYCIN <=0.5 SENSITIVE Sensitive     TRIMETH/SULFA <=10 SENSITIVE Sensitive     CLINDAMYCIN <=0.25 SENSITIVE Sensitive     RIFAMPIN <=0.5 SENSITIVE Sensitive     Inducible Clindamycin NEGATIVE Sensitive     * ABUNDANT STAPHYLOCOCCUS AUREUS  Aerobic/Anaerobic Culture (surgical/deep wound)     Status: None (Preliminary result)   Collection Time: 12/11/17  4:09 PM  Result Value Ref Range Status   Specimen Description WOUND  Final   Special Requests NONE  Final   Gram Stain   Final    FEW WBC PRESENT, PREDOMINANTLY PMN FEW GRAM POSITIVE COCCI IN PAIRS IN CLUSTERS Performed at [facility], [place] [address] [place], [place] [address]    Culture   Final    MODERATE STAPHYLOCOCCUS AUREUS NO ANAEROBES ISOLATED; CULTURE IN PROGRESS FOR 5 DAYS    Report Status PENDING  Incomplete   Organism ID, Bacteria STAPHYLOCOCCUS AUREUS  Final      Susceptibility   Staphylococcus aureus - MIC*    CIPROFLOXACIN <=0.5 SENSITIVE Sensitive     ERYTHROMYCIN <=0.25 SENSITIVE Sensitive     GENTAMICIN <=0.5 SENSITIVE Sensitive     OXACILLIN 0.5 SENSITIVE Sensitive     TETRACYCLINE <=1 SENSITIVE Sensitive     VANCOMYCIN <=0.5 SENSITIVE Sensitive     TRIMETH/SULFA <=10 SENSITIVE Sensitive     CLINDAMYCIN <=0.25 SENSITIVE Sensitive     RIFAMPIN <=0.5 SENSITIVE Sensitive     Inducible Clindamycin NEGATIVE Sensitive     * MODERATE STAPHYLOCOCCUS AUREUS  Culture, blood (routine x 2)     Status: None (Preliminary result)   Collection Time: 12/12/17  5:25 AM  Result Value Ref Range Status   Specimen Description BLOOD LEFT HAND  Final   Special Requests   Final    BOTTLES DRAWN AEROBIC AND ANAEROBIC Blood Culture adequate volume   Culture   Final    NO GROWTH 1 DAY Performed at [facility], [address], [place], [place] [address]    Report Status PENDING  Incomplete  Culture, blood (routine x 2)     Status: None  (Preliminary result)   Collection Time: 12/12/17  5:35 AM  Result Value Ref Range Status   Specimen Description BLOOD LEFT HAND  Final   Special Requests   Final    BOTTLES DRAWN AEROBIC AND ANAEROBIC Blood Culture adequate volume   Culture   Final    NO GROWTH 1 DAY Performed at [facility], [place] [address], [place], [place] [address]    Report Status PENDING  Incomplete  Culture, blood (routine x 2)     Status: Abnormal   Collection Time: 12/12/17  9:55 AM  Result Value Ref Range Status   Specimen Description BLOOD LEFT HAND  Final   Special Requests   Final    BOTTLES DRAWN AEROBIC ONLY Blood Culture results may not be optimal due to an inadequate volume of blood received in culture bottles   Culture  Setup Time   Final    GRAM POSITIVE COCCI AEROBIC BOTTLE ONLY CRITICAL VALUE NOTED.  VALUE IS CONSISTENT WITH PREVIOUSLY REPORTED AND CALLED VALUE.    Culture (A)  Final    STAPHYLOCOCCUS AUREUS SUSCEPTIBILITIES PERFORMED ON PREVIOUS CULTURE WITHIN THE LAST 5 DAYS. Performed at [facility], [address] [place], [place] [address]    Report Status 12/14/2017 FINAL  Final  Culture, blood (routine x 2)     Status: Abnormal   Collection Time: 12/12/17 10:00 AM  Result Value Ref Range Status   Specimen Description BLOOD LEFT HAND  Final   Special Requests   Final    BOTTLES DRAWN AEROBIC AND ANAEROBIC Blood Culture adequate volume   Culture  Setup Time   Final    GRAM POSITIVE COCCI AEROBIC BOTTLE ONLY CRITICAL VALUE NOTED.  VALUE IS CONSISTENT WITH PREVIOUSLY REPORTED AND CALLED VALUE.    Culture (A)  Final    STAPHYLOCOCCUS AUREUS SUSCEPTIBILITIES PERFORMED ON PREVIOUS CULTURE WITHIN THE LAST 5 DAYS. Performed at [facility], [place] [address] [place], [place] [address]    Report Status 12/14/2017 FINAL  Final     Scheduled Meds: . heparin  5,000 Units Subcutaneous Q8H  . metoprolol succinate  25 mg Oral Daily  . multivitamin with minerals  1 tablet  Oral Daily  . pantoprazole  40 mg Oral Daily  . simvastatin  40 mg Oral QPM  . tamsulosin  0.4 mg Oral QPM     LOS: 4 days   Cherene Altes, MD [facility] Office  [phone] Pager - Text Page per Amion  If 7PM-7AM, please contact night-coverage per Amion 12/14/2017, 2:50 PM

## 2017-12-14 NOTE — Progress Notes (Signed)
Physical Therapy Treatment Patient Details Name: Tyrone Roman MRN: [id number] DOB: [birth year] Today's Date: 12/14/2017    History of Present Illness 72 y.o. male admitted on 12/10/17 for AMS.  Found to have sepsis due to back wound infection.  S/p I and D of lumbar wound on 12/11/17.  Pt's blood cultures (+) for MSSA bacteremia.  Pt with continued AMS post op and neuro consulted.  CT (normal) and MRI (negative), but they are suspicious of narcotic reaction.  Dx with acute metabolic encephalopathy.  Pt with significant PMH of R knee arthroscopy (multiple), RA, MI, HTN dysphagia, COPD, CAD, concussion, lumbar microdiscectomy 10/07/17.      PT Comments    Pt was able to progress gait down the hallway today with RW and min guard assist overall.  He continues to say some odd things (out of context) at times, but generally was normally conversant with me today.  There was no family or friends in the room to say if he seems at baseline or not.  PT will continue to follow acutely for safe mobility progression  Follow Up Recommendations  Home health PT;Supervision/Assistance - 24 hour     Equipment Recommendations  None recommended by PT    Recommendations for Other Services   NA     Precautions / Restrictions Precautions Precautions: Back Precaution Comments: reviewed log roll Other Brace/Splint: hemo vac    Mobility  Bed Mobility Overal bed mobility: Needs Assistance Bed Mobility: Rolling;Sidelying to Sit Rolling: Supervision Sidelying to sit: Min guard       General bed mobility comments: Supervision to roll, cues for log roll technique, min assist to support trunk during transition to sit up.   Transfers Overall transfer level: Needs assistance Equipment used: Rolling walker (2 wheeled) Transfers: Sit to/from Stand Sit to Stand: Min assist;From elevated surface         General transfer comment: Min assist to stand from elevated bed to RW and sit down to low recliner chair.   Verbal cues for safe RW use and safe hand placement.   Ambulation/Gait Ambulation/Gait assistance: Min guard Gait Distance (Feet): 130 Feet Assistive device: Rolling walker (2 wheeled) Gait Pattern/deviations: Step-through pattern(some instability noted on R ankle (inversion))     General Gait Details: Pt with step through gait pattern, reporting weak LEs with R leg being weaker PTA due to back.  Pt does tend to invert his ankle on this side and does not seem to be aware of this instability moment.           Balance Overall balance assessment: Needs assistance Sitting-balance support: Feet supported;Bilateral upper extremity supported;No upper extremity supported Sitting balance-Leahy Scale: Good     Standing balance support: Bilateral upper extremity supported Standing balance-Leahy Scale: Poor Standing balance comment: needs external support (light)                            Cognition Arousal/Alertness: Awake/alert Behavior During Therapy: WFL for tasks assessed/performed Overall Cognitive Status: No family/caregiver present to determine baseline cognitive functioning                                 General Comments: Pt seems more alert and oriented today.  Not specifically tested.  He does say some odd things that are out of context every once in a while, but I am not sure that this is not his  baseline personality.  No family or friends present to report baseline.              Pertinent Vitals/Pain Pain Assessment: Faces Faces Pain Scale: Hurts even more Pain Location: Back Pain Descriptors / Indicators: Grimacing;Guarding Pain Intervention(s): Limited activity within patient's tolerance;Monitored during session;Repositioned           PT Goals (current goals can now be found in the care plan section) Acute Rehab PT Goals Patient Stated Goal: none stated Progress towards PT goals: Progressing toward goals    Frequency    Min  3X/week      PT Plan Current plan remains appropriate       AM-PAC PT "6 Clicks" Daily Activity  Outcome Measure  Difficulty turning over in bed (including adjusting bedclothes, sheets and blankets)?: A Little Difficulty moving from lying on back to sitting on the side of the bed? : Unable Difficulty sitting down on and standing up from a chair with arms (e.g., wheelchair, bedside commode, etc,.)?: Unable Help needed moving to and from a bed to chair (including a wheelchair)?: A Little Help needed walking in hospital room?: A Little Help needed climbing 3-5 steps with a railing? : A Little 6 Click Score: 14    End of Session Equipment Utilized During Treatment: Gait belt Activity Tolerance: Patient tolerated treatment well Patient left: in chair;with call bell/phone within reach;with chair alarm set Nurse Communication: Mobility status PT Visit Diagnosis: Muscle weakness (generalized) (M62.81);Pain;Other symptoms and signs involving the nervous system (R29.898);Difficulty in walking, not elsewhere classified (R26.2) Pain - Right/Left: (lower) Pain - part of body: (back)     Time: 3338-3291 PT Time Calculation (min) (ACUTE ONLY): 20 min  Charges:  $Gait Training: 8-22 mins          Seham [place] B. Kinsey Karch, PT, DPT  Acute Rehabilitation [phone] pager #[phone] office            12/14/2017, 10:15 AM

## 2017-12-14 NOTE — Progress Notes (Signed)
Occupational Therapy Treatment Patient Details Name: Tyrone Roman MRN: [id number] DOB: 06-Nov-1945 Today's Date: 12/14/2017    History of present illness 72 y.o. male admitted on 12/10/17 for AMS.  Found to have sepsis due to back wound infection.  S/p I and D of lumbar wound on 12/11/17.  Pt's blood cultures (+) for MSSA bacteremia.  Pt with continued AMS post op and neuro consulted.  CT (normal) and MRI (negative), but they are suspicious of narcotic reaction.  Dx with acute metabolic encephalopathy.  Pt with significant PMH of R knee arthroscopy (multiple), RA, MI, HTN dysphagia, COPD, CAD, concussion, lumbar microdiscectomy 10/07/17.     OT comments  Pt. Seen for skilled OT.  Able to demonstrate safe technique for LB dressing while maintaining back precautions.  Amb. To b.room and completed toilet transfer with min guard A.  Intermittent cueing needed for hand placement during sit/stand.    Follow Up Recommendations  Home health OT;Supervision/Assistance - 24 hour    Equipment Recommendations  None recommended by OT    Recommendations for Other Services      Precautions / Restrictions Precautions Precautions: Back Precaution Comments: reviewed log roll Other Brace/Splint: hemo vac       Mobility Bed Mobility Overal bed mobility: Needs Assistance Bed Mobility: Rolling;Sidelying to Sit Rolling: Supervision Sidelying to sit: Min guard       General bed mobility comments: seated in recliner at beg. of session and on commode at end of session  Transfers Overall transfer level: Needs assistance Equipment used: Rolling walker (2 wheeled) Transfers: Sit to/from [facility] Sit to Stand: Min assist Stand pivot transfers: Min guard       General transfer comment: pt. required max cues for hand placement on arm rests while transitioning into standing. initially adament about pulling on the rw, then said "well this isnt going to work let me try these arm rests"     Balance Overall balance assessment: Needs assistance Sitting-balance support: Feet supported;Bilateral upper extremity supported;No upper extremity supported Sitting balance-Leahy Scale: Good     Standing balance support: Bilateral upper extremity supported Standing balance-Leahy Scale: Poor Standing balance comment: needs external support (light)                           ADL either performed or assessed with clinical judgement   ADL Overall ADL's : Needs assistance/impaired               Lower Body Bathing Details (indicate cue type and reason): demonstrated ability to cross each leg over knee and will be able to access b les and feet for bathing in seated position       Lower Body Dressing Details (indicate cue type and reason): Pt able to bring ankles to knees for donning socks. reports he dons underwear the same way.  describes having a dressing bench at the foot of his bed that he sits on to don socks and underwear each day Toilet Transfer: Min guard;Grab bars;Ambulation;Cueing for sequencing;Comfort height toilet Toilet Transfer Details (indicate cue type and reason): cues to utilize grab bar when transitioning into seated position. reviewed use of 3n1 over the commode. pt. declined and wanted to use toilet as is.          Functional mobility during ADLs: Passenger transport manager     Praxis      Cognition  Arousal/Alertness: Awake/alert Behavior During Therapy: WFL for tasks assessed/performed Overall Cognitive Status: No family/caregiver present to determine baseline cognitive functioning                                 General Comments: Pt seems more alert and oriented today.  Not specifically tested.  He does say some odd things that are out of context every once in a while, but I am not sure that this is not his baseline personality.  No family or friends present to report baseline.          Exercises     Shoulder Instructions       General Comments      Pertinent Vitals/ Pain       Pain Assessment: No/denies pain Faces Pain Scale: Hurts even more Pain Location: Back Pain Descriptors / Indicators: Grimacing;Guarding Pain Intervention(s): Limited activity within patient's tolerance;Monitored during session;Repositioned  Home Living                                          Prior Functioning/Environment              Frequency  Min 3X/week        Progress Toward Goals  OT Goals(current goals can now be found in the care plan section)  Progress towards OT goals: Progressing toward goals  Acute Rehab OT Goals Patient Stated Goal: none stated  Plan Discharge plan remains appropriate    Co-evaluation                 AM-PAC PT "6 Clicks" Daily Activity     Outcome Measure   Help from another person eating meals?: None Help from another person taking care of personal grooming?: A Little Help from another person toileting, which includes using toliet, bedpan, or urinal?: A Lot Help from another person bathing (including washing, rinsing, drying)?: A Lot Help from another person to put on and taking off regular upper body clothing?: A Little Help from another person to put on and taking off regular lower body clothing?: A Lot 6 Click Score: 16    End of Session Equipment Utilized During Treatment: Rolling walker  OT Visit Diagnosis: Unsteadiness on feet (R26.81);Other abnormalities of gait and mobility (R26.89);Muscle weakness (generalized) (M62.81);Pain   Activity Tolerance Patient tolerated treatment well   Patient Left Other (comment)(left in b.room seated on commode. repeated x2 understanding to pull the string for assistance when he was finished)   Nurse Communication Other (comment)(alerted rn that pt. was seated on commode in the b.room instructed to pull string when finished and needing assistance.  also that IV was  beeping)        Time: 6629-4765 OT Time Calculation (min): 15 min  Charges: OT General Charges $OT Visit: 1 Visit OT Treatments $Self Care/Home Management : 8-22 mins   Janice Coffin, COTA/L 12/14/2017, 10:45 AM

## 2017-12-14 NOTE — Progress Notes (Signed)
NEUROSURGERY PROGRESS NOTE  Doing well. No complaints or acute events overnight.  No new nsgy recom.  Incision CDI.  Afebrile ABX per ID  Temp:  [97.7 F (36.5 C)-102.1 F (38.9 C)] 98.5 F (36.9 C) (11/09 0805) Pulse Rate:  [84-112] 87 (11/09 0805) Resp:  [19-30] 22 (11/09 0805) BP: (137-166)/(80-102) 166/96 (11/09 0805) SpO2:  [95 %-100 %] 100 % (11/09 0805)  Eleonore Chiquito, NP 12/14/2017 8:23 AM

## 2017-12-15 ENCOUNTER — Encounter (HOSPITAL_COMMUNITY): Payer: Self-pay | Admitting: *Deleted

## 2017-12-15 LAB — CBC
HCT: 33.9 % — ABNORMAL LOW (ref 39.0–52.0)
Hemoglobin: 10.8 g/dL — ABNORMAL LOW (ref 13.0–17.0)
MCH: 28 pg (ref 26.0–34.0)
MCHC: 31.9 g/dL (ref 30.0–36.0)
MCV: 87.8 fL (ref 80.0–100.0)
Platelets: 229 10*3/uL (ref 150–400)
RBC: 3.86 MIL/uL — ABNORMAL LOW (ref 4.22–5.81)
RDW: 14.1 % (ref 11.5–15.5)
WBC: 8.1 10*3/uL (ref 4.0–10.5)
nRBC: 0 % (ref 0.0–0.2)

## 2017-12-15 LAB — BASIC METABOLIC PANEL
Anion gap: 4 — ABNORMAL LOW (ref 5–15)
BUN: 18 mg/dL (ref 8–23)
CO2: 24 mmol/L (ref 22–32)
Calcium: 9 mg/dL (ref 8.9–10.3)
Chloride: 113 mmol/L — ABNORMAL HIGH (ref 98–111)
Creatinine, Ser: 1.46 mg/dL — ABNORMAL HIGH (ref 0.61–1.24)
GFR calc Af Amer: 54 mL/min — ABNORMAL LOW (ref >60)
GFR calc non Af Amer: 46 mL/min — ABNORMAL LOW (ref >60)
Glucose, Bld: 123 mg/dL — ABNORMAL HIGH (ref 70–99)
Potassium: 3.5 mmol/L (ref 3.5–5.1)
Sodium: 141 mmol/L (ref 135–145)

## 2017-12-15 LAB — CULTURE, BLOOD (ROUTINE X 2): Special Requests: ADEQUATE

## 2017-12-15 LAB — MAGNESIUM: Magnesium: 1.8 mg/dL (ref 1.7–2.4)

## 2017-12-15 NOTE — Progress Notes (Signed)
[facility] TEAM 1 - Stepdown/ICU TEAM  Tyrone Roman  QMG:[id number] DOB: [birth year] DOA: 12/10/2017 PCP: [name], MD    Brief Narrative:  72 y.o.malew/ a hx of RA not on meds and a lumbarmicrodiscetcomy 12/06/17 by Dr. Kathyrn Sheriff who presented to the ED with c/oprogressive back pain 5/10 at rest and 10/10 with movement,AMS, and increased weakness of his LLE.  Subjective: Remains alert and interactive. Fully oriented. Motivated to get home asap. Denies cp, sob. Back pain well controlled.   Assessment & Plan:  Severe Sepsis - MSSA bacteremia due to lumbar wound infection ID managing antibiotics - wound I&D 11/6 - wound looks good per NS - plan will be to place PICC 11/11 if blood cxs remain negative, then D/C home w/ abx and PT/OT   HNP s/p s/p microdiscectomy 12/06/17 NS following  Acute toxic metabolic encephalopathy likely multifactorial in the setting of infection, recent surgery, sedating medications, and hospital setting - MRI brain w/o acute findings - attempt to avoid sedatives as able - resolved at this time    AKI  baseline ~ 0.9-1.0 - renal fxn continues to improve   Recent Labs  Lab 12/11/17 0500 12/12/17 0512 12/13/17 0402 12/14/17 0554 12/15/17 0553  CREATININE 1.71* 1.38* 1.56* 1.54* 1.46*    Rheumatoid arthritis was on Cimza injections in the past but on no focused tx of late   Coronary artery disease    cont statin - ASA on hold  HTN No change in tx today - follow trend  GERD  COPD    DVT prophylaxis: SCDs Code Status: FULL CODE Family Communication: no family present at time of exam today  Disposition Plan: stable for tele bed - possible d/c home 11/11 or 11/12   Consultants:  ID NS Neuro  Antimicrobials:  Nafcillin 11/7 > Aztreonam 11/5 Vanc 11/5 > 11/6   Objective: Blood pressure (!) 146/67, pulse 92, temperature 98.8 F (37.1 C), resp. rate 19, height 6\' 2"  (1.88 m), weight 105.7 kg, SpO2 96 %.  Intake/Output Summary  (Last 24 hours) at 12/15/2017 1401 Last data filed at 12/15/2017 0300 Gross per 24 hour  Intake -  Output 2450 ml  Net -2450 ml   Filed Weights   12/10/17 1401  Weight: 105.7 kg    Examination: General: NAD - alert/oriented  Lungs: CTA B - no wheezing  Cardiovascular: RRR  Abdomen: NT/ND, soft,  Extremities: No C/C/E B LE   CBC: Recent Labs  Lab 12/10/17 1418  12/13/17 0402 12/14/17 0554 12/15/17 0553  WBC 5.2   < > 7.9 8.5 8.1  NEUTROABS 3.5  --   --   --   --   HGB 16.2   < > 11.6* 10.6* 10.8*  HCT 49.6   < > 36.7* 33.5* 33.9*  MCV 89.4   < > 89.1 88.4 87.8  PLT 150   < > 112* 164 229   < > = values in this interval not displayed.   Basic Metabolic Panel: Recent Labs  Lab 12/13/17 0402 12/14/17 0554 12/15/17 0553  NA 139 139 141  K 3.6 3.0* 3.5  CL 110 109 113*  CO2 24 23 24   GLUCOSE 118* 138* 123*  BUN 26* 23 18  CREATININE 1.56* 1.54* 1.46*  CALCIUM 9.1 8.8* 9.0  MG  --   --  1.8   GFR: Estimated Creatinine Clearance: 59.3 mL/min (A) (by C-G formula based on SCr of 1.46 mg/dL (H)).  Liver Function Tests: Recent Labs  Lab  12/10/17 1418 12/14/17 0554  AST 58* 35  ALT 32 27  ALKPHOS 48 68  BILITOT 1.4* 1.9*  PROT 7.0 4.9*  ALBUMIN 2.9* 1.6*    Recent Labs  Lab 12/10/17 1832 12/14/17 0556  AMMONIA 20 41*    CBG: Recent Labs  Lab 12/10/17 1416  GLUCAP 174*    Recent Results (from the past 240 hour(s))  Blood Culture (routine x 2)     Status: Abnormal   Collection Time: 12/10/17  2:43 PM  Result Value Ref Range Status   Specimen Description BLOOD RIGHT ANTECUBITAL  Final   Special Requests   Final    BOTTLES DRAWN AEROBIC AND ANAEROBIC Blood Culture adequate volume   Culture  Setup Time   Final    GRAM POSITIVE COCCI IN BOTH AEROBIC AND ANAEROBIC BOTTLES CRITICAL RESULT CALLED TO, READ BACK BY AND VERIFIED WITH: M. PHAM, PHARMD AT 0845 ON 12/11/17 BY C. JESSUP, MLT. Performed at [facility], [place] [address],  [place], [place] [address]    Culture STAPHYLOCOCCUS AUREUS (A)  Final   Report Status 12/13/2017 FINAL  Final   Organism ID, Bacteria STAPHYLOCOCCUS AUREUS  Final      Susceptibility   Staphylococcus aureus - MIC*    CIPROFLOXACIN <=0.5 SENSITIVE Sensitive     ERYTHROMYCIN <=0.25 SENSITIVE Sensitive     GENTAMICIN <=0.5 SENSITIVE Sensitive     OXACILLIN <=0.25 SENSITIVE Sensitive     TETRACYCLINE <=1 SENSITIVE Sensitive     VANCOMYCIN <=0.5 SENSITIVE Sensitive     TRIMETH/SULFA <=10 SENSITIVE Sensitive     CLINDAMYCIN <=0.25 SENSITIVE Sensitive     RIFAMPIN <=0.5 SENSITIVE Sensitive     Inducible Clindamycin NEGATIVE Sensitive     * STAPHYLOCOCCUS AUREUS  Blood Culture ID Panel (Reflexed)     Status: Abnormal   Collection Time: 12/10/17  2:43 PM  Result Value Ref Range Status   Enterococcus species NOT DETECTED NOT DETECTED Final   Listeria monocytogenes NOT DETECTED NOT DETECTED Final   Staphylococcus species DETECTED (A) NOT DETECTED Final    Comment: CRITICAL RESULT CALLED TO, READ BACK BY AND VERIFIED WITH: M. PHAM, PHARMD AT 0845 ON 12/11/17 BY C. JESSUP, MLT.    Staphylococcus aureus (BCID) DETECTED (A) NOT DETECTED Final    Comment: Methicillin (oxacillin) susceptible Staphylococcus aureus (MSSA). Preferred therapy is anti staphylococcal beta lactam antibiotic (Cefazolin or Nafcillin), unless clinically contraindicated. CRITICAL RESULT CALLED TO, READ BACK BY AND VERIFIED WITH: M. PHAM, PHARMD AT 0845 ON 12/11/17 BY C. JESSUP, MLT.    Methicillin resistance NOT DETECTED NOT DETECTED Final   Streptococcus species NOT DETECTED NOT DETECTED Final   Streptococcus agalactiae NOT DETECTED NOT DETECTED Final   Streptococcus pneumoniae NOT DETECTED NOT DETECTED Final   Streptococcus pyogenes NOT DETECTED NOT DETECTED Final   Acinetobacter baumannii NOT DETECTED NOT DETECTED Final   Enterobacteriaceae species NOT DETECTED NOT DETECTED Final   Enterobacter cloacae complex NOT DETECTED  NOT DETECTED Final   Escherichia coli NOT DETECTED NOT DETECTED Final   Klebsiella oxytoca NOT DETECTED NOT DETECTED Final   Klebsiella pneumoniae NOT DETECTED NOT DETECTED Final   Proteus species NOT DETECTED NOT DETECTED Final   Serratia marcescens NOT DETECTED NOT DETECTED Final   Haemophilus influenzae NOT DETECTED NOT DETECTED Final   Neisseria meningitidis NOT DETECTED NOT DETECTED Final   Pseudomonas aeruginosa NOT DETECTED NOT DETECTED Final   Candida albicans NOT DETECTED NOT DETECTED Final   Candida glabrata NOT DETECTED NOT DETECTED Final  Candida krusei NOT DETECTED NOT DETECTED Final   Candida parapsilosis NOT DETECTED NOT DETECTED Final   Candida tropicalis NOT DETECTED NOT DETECTED Final    Comment: Performed at [facility], [place] [address], [place], [place] [address]  Blood Culture (routine x 2)     Status: Abnormal   Collection Time: 12/10/17  3:30 PM  Result Value Ref Range Status   Specimen Description BLOOD RIGHT ARM  Final   Special Requests   Final    BOTTLES DRAWN AEROBIC AND ANAEROBIC Blood Culture adequate volume   Culture  Setup Time   Final    GRAM POSITIVE COCCI IN BOTH AEROBIC AND ANAEROBIC BOTTLES CRITICAL RESULT CALLED TO, READ BACK BY AND VERIFIED WITH: M. PHAM, PHARMD, AT 0845 ON 12/11/17 BY C. JESSUP, MLT.    Culture (A)  Final    STAPHYLOCOCCUS AUREUS SUSCEPTIBILITIES PERFORMED ON PREVIOUS CULTURE WITHIN THE LAST 5 DAYS. Performed at [facility], [place] [address], [place], [place] [address]    Report Status 12/13/2017 FINAL  Final  MRSA PCR Screening     Status: None   Collection Time: 12/10/17  9:26 PM  Result Value Ref Range Status   MRSA by PCR NEGATIVE NEGATIVE Final    Comment:        The GeneXpert MRSA Assay (FDA approved for NASAL specimens only), is one component of a comprehensive MRSA colonization surveillance program. It is not intended to diagnose MRSA infection nor to guide or monitor treatment for MRSA  infections. Performed at [facility], [place] [address] [place], [place] [address]   Aerobic Culture (superficial specimen)     Status: None   Collection Time: 12/11/17 10:00 AM  Result Value Ref Range Status   Specimen Description WOUND BACK  Final   Special Requests NONE  Final   Gram Stain   Final    RARE WBC PRESENT, PREDOMINANTLY PMN FEW GRAM POSITIVE COCCI Performed at [facility], [place] [address], [place], [place] [address]    Culture MODERATE STAPHYLOCOCCUS AUREUS  Final   Report Status 12/13/2017 FINAL  Final   Organism ID, Bacteria STAPHYLOCOCCUS AUREUS  Final      Susceptibility   Staphylococcus aureus - MIC*    CIPROFLOXACIN <=0.5 SENSITIVE Sensitive     ERYTHROMYCIN <=0.25 SENSITIVE Sensitive     GENTAMICIN <=0.5 SENSITIVE Sensitive     OXACILLIN 0.5 SENSITIVE Sensitive     TETRACYCLINE <=1 SENSITIVE Sensitive     VANCOMYCIN <=0.5 SENSITIVE Sensitive     TRIMETH/SULFA <=10 SENSITIVE Sensitive     CLINDAMYCIN <=0.25 SENSITIVE Sensitive     RIFAMPIN <=0.5 SENSITIVE Sensitive     Inducible Clindamycin NEGATIVE Sensitive     * MODERATE STAPHYLOCOCCUS AUREUS  Aerobic/Anaerobic Culture (surgical/deep wound)     Status: None (Preliminary result)   Collection Time: 12/11/17  4:09 PM  Result Value Ref Range Status   Specimen Description WOUND  Final   Special Requests NONE  Final   Gram Stain   Final    FEW WBC PRESENT, PREDOMINANTLY PMN FEW GRAM POSITIVE COCCI IN PAIRS IN CLUSTERS Performed at [facility], [address], [place], [place] [address]    Culture   Final    ABUNDANT STAPHYLOCOCCUS AUREUS NO ANAEROBES ISOLATED; CULTURE IN PROGRESS FOR 5 DAYS    Report Status PENDING  Incomplete   Organism ID, Bacteria STAPHYLOCOCCUS AUREUS  Final      Susceptibility   Staphylococcus aureus -  MIC*    CIPROFLOXACIN <=0.5 SENSITIVE Sensitive     ERYTHROMYCIN <=0.25 SENSITIVE Sensitive     GENTAMICIN <=0.5 SENSITIVE Sensitive     OXACILLIN 0.5  SENSITIVE Sensitive     TETRACYCLINE <=1 SENSITIVE Sensitive     VANCOMYCIN <=0.5 SENSITIVE Sensitive     TRIMETH/SULFA <=10 SENSITIVE Sensitive     CLINDAMYCIN <=0.25 SENSITIVE Sensitive     RIFAMPIN <=0.5 SENSITIVE Sensitive     Inducible Clindamycin NEGATIVE Sensitive     * ABUNDANT STAPHYLOCOCCUS AUREUS  Aerobic/Anaerobic Culture (surgical/deep wound)     Status: None (Preliminary result)   Collection Time: 12/11/17  4:09 PM  Result Value Ref Range Status   Specimen Description WOUND  Final   Special Requests NONE  Final   Gram Stain   Final    FEW WBC PRESENT, PREDOMINANTLY PMN FEW GRAM POSITIVE COCCI IN PAIRS IN CLUSTERS Performed at [facility], [place] [address] [place], [place] [address]    Culture   Final    MODERATE STAPHYLOCOCCUS AUREUS NO ANAEROBES ISOLATED; CULTURE IN PROGRESS FOR 5 DAYS    Report Status PENDING  Incomplete   Organism ID, Bacteria STAPHYLOCOCCUS AUREUS  Final      Susceptibility   Staphylococcus aureus - MIC*    CIPROFLOXACIN <=0.5 SENSITIVE Sensitive     ERYTHROMYCIN <=0.25 SENSITIVE Sensitive     GENTAMICIN <=0.5 SENSITIVE Sensitive     OXACILLIN 0.5 SENSITIVE Sensitive     TETRACYCLINE <=1 SENSITIVE Sensitive     VANCOMYCIN <=0.5 SENSITIVE Sensitive     TRIMETH/SULFA <=10 SENSITIVE Sensitive     CLINDAMYCIN <=0.25 SENSITIVE Sensitive     RIFAMPIN <=0.5 SENSITIVE Sensitive     Inducible Clindamycin NEGATIVE Sensitive     * MODERATE STAPHYLOCOCCUS AUREUS  Culture, blood (routine x 2)     Status: None (Preliminary result)   Collection Time: 12/12/17  5:25 AM  Result Value Ref Range Status   Specimen Description BLOOD LEFT HAND  Final   Special Requests   Final    BOTTLES DRAWN AEROBIC AND ANAEROBIC Blood Culture adequate volume   Culture  Setup Time   Final    GRAM POSITIVE COCCI ANAEROBIC BOTTLE ONLY CRITICAL VALUE NOTED.  VALUE IS CONSISTENT WITH PREVIOUSLY REPORTED AND CALLED VALUE.    Culture   Final    NO GROWTH 3  DAYS Performed at [facility], [place] [address] [place], [place] [address]    Report Status PENDING  Incomplete  Culture, blood (routine x 2)     Status: None (Preliminary result)   Collection Time: 12/12/17  5:35 AM  Result Value Ref Range Status   Specimen Description BLOOD LEFT HAND  Final   Special Requests   Final    BOTTLES DRAWN AEROBIC AND ANAEROBIC Blood Culture adequate volume   Culture   Final    NO GROWTH 3 DAYS Performed at [facility], [place] [address], [place], [place] [address]    Report Status PENDING  Incomplete  Culture, blood (routine x 2)     Status: Abnormal   Collection Time: 12/12/17  9:55 AM  Result Value Ref Range Status   Specimen Description BLOOD LEFT HAND  Final   Special Requests   Final    BOTTLES DRAWN AEROBIC ONLY Blood Culture results may not be optimal due to an inadequate volume of blood received in culture bottles   Culture  Setup Time   Final    GRAM POSITIVE COCCI AEROBIC  BOTTLE ONLY CRITICAL VALUE NOTED.  VALUE IS CONSISTENT WITH PREVIOUSLY REPORTED AND CALLED VALUE.    Culture (A)  Final    STAPHYLOCOCCUS AUREUS SUSCEPTIBILITIES PERFORMED ON PREVIOUS CULTURE WITHIN THE LAST 5 DAYS. Performed at [facility], [place] [address], [place], [place] [address]    Report Status 12/14/2017 FINAL  Final  Culture, blood (routine x 2)     Status: Abnormal   Collection Time: 12/12/17 10:00 AM  Result Value Ref Range Status   Specimen Description BLOOD LEFT HAND  Final   Special Requests   Final    BOTTLES DRAWN AEROBIC AND ANAEROBIC Blood Culture adequate volume   Culture  Setup Time   Final    GRAM POSITIVE COCCI IN BOTH AEROBIC AND ANAEROBIC BOTTLES CRITICAL VALUE NOTED.  VALUE IS CONSISTENT WITH PREVIOUSLY REPORTED AND CALLED VALUE.    Culture (A)  Final    STAPHYLOCOCCUS AUREUS SUSCEPTIBILITIES PERFORMED ON PREVIOUS CULTURE WITHIN THE LAST 5 DAYS. Performed at [facility], [place] [address], [place], [place] [address]     Report Status 12/14/2017 FINAL  Final  Culture, blood (routine x 2)     Status: None (Preliminary result)   Collection Time: 12/14/17  5:46 AM  Result Value Ref Range Status   Specimen Description BLOOD LEFT ANTECUBITAL  Final   Special Requests   Final    BOTTLES DRAWN AEROBIC ONLY Blood Culture adequate volume   Culture   Final    NO GROWTH 1 DAY Performed at [facility], [place] [address] [place], [place] [address]    Report Status PENDING  Incomplete  Culture, blood (routine x 2)     Status: None (Preliminary result)   Collection Time: 12/14/17  5:54 AM  Result Value Ref Range Status   Specimen Description BLOOD LEFT HAND  Final   Special Requests   Final    BOTTLES DRAWN AEROBIC AND ANAEROBIC Blood Culture adequate volume   Culture   Final    NO GROWTH 1 DAY Performed at [facility], [place] [address], [place], [place] [address]    Report Status PENDING  Incomplete     Scheduled Meds: . heparin  5,000 Units Subcutaneous Q8H  . metoprolol succinate  25 mg Oral Daily  . multivitamin with minerals  1 tablet Oral Daily  . pantoprazole  40 mg Oral Daily  . simvastatin  40 mg Oral QPM  . tamsulosin  0.4 mg Oral QPM     LOS: 5 days   Cherene Altes, MD [facility] Office  [phone] Pager - Text Page per Amion  If 7PM-7AM, please contact night-coverage per Amion 12/15/2017, 2:01 PM

## 2017-12-16 ENCOUNTER — Inpatient Hospital Stay: Payer: Self-pay

## 2017-12-16 LAB — BASIC METABOLIC PANEL
Anion gap: 6 (ref 5–15)
BUN: 14 mg/dL (ref 8–23)
CO2: 24 mmol/L (ref 22–32)
Calcium: 9.1 mg/dL (ref 8.9–10.3)
Chloride: 110 mmol/L (ref 98–111)
Creatinine, Ser: 1.4 mg/dL — ABNORMAL HIGH (ref 0.61–1.24)
GFR calc Af Amer: 56 mL/min — ABNORMAL LOW (ref >60)
GFR calc non Af Amer: 49 mL/min — ABNORMAL LOW (ref >60)
Glucose, Bld: 114 mg/dL — ABNORMAL HIGH (ref 70–99)
Potassium: 3.2 mmol/L — ABNORMAL LOW (ref 3.5–5.1)
Sodium: 140 mmol/L (ref 135–145)

## 2017-12-16 LAB — CBC
HCT: 33.6 % — ABNORMAL LOW (ref 39.0–52.0)
Hemoglobin: 10.9 g/dL — ABNORMAL LOW (ref 13.0–17.0)
MCH: 28.4 pg (ref 26.0–34.0)
MCHC: 32.4 g/dL (ref 30.0–36.0)
MCV: 87.5 fL (ref 80.0–100.0)
Platelets: 250 10*3/uL (ref 150–400)
RBC: 3.84 MIL/uL — ABNORMAL LOW (ref 4.22–5.81)
RDW: 14.2 % (ref 11.5–15.5)
WBC: 8.4 10*3/uL (ref 4.0–10.5)
nRBC: 0 % (ref 0.0–0.2)

## 2017-12-16 MED ORDER — SODIUM CHLORIDE 0.9% FLUSH
10.0000 mL | Freq: Two times a day (BID) | INTRAVENOUS | Status: DC
  Administered 2017-12-16 – 2017-12-17 (×2): 10 mL

## 2017-12-16 MED ORDER — NAFCILLIN IV (FOR PTA / DISCHARGE USE ONLY): 12.0000 g | INTRAVENOUS | 0 refills | Status: DC

## 2017-12-16 MED ORDER — POTASSIUM CHLORIDE CRYS ER 20 MEQ PO TBCR
40.0000 meq | EXTENDED_RELEASE_TABLET | Freq: Once | ORAL | Status: AC
  Administered 2017-12-16: 40 meq via ORAL
  Filled 2017-12-16: qty 2

## 2017-12-16 MED ORDER — ISOSORBIDE MONONITRATE ER 30 MG PO TB24
30.0000 mg | ORAL_TABLET | Freq: Every day | ORAL | Status: DC
  Administered 2017-12-16 – 2017-12-17 (×2): 30 mg via ORAL
  Filled 2017-12-16 (×2): qty 1

## 2017-12-16 MED ORDER — SODIUM CHLORIDE 0.9% FLUSH: 10.0000 mL | INTRAVENOUS | Status: DC | PRN

## 2017-12-16 NOTE — Progress Notes (Signed)
Peripherally Inserted Central Catheter/Midline Placement  The IV Nurse has discussed with the patient and/or persons authorized to consent for the patient, the purpose of this procedure and the potential benefits and risks involved with this procedure.  The benefits include less needle sticks, lab draws from the catheter, and the patient may be discharged home with the catheter. Risks include, but not limited to, infection, bleeding, blood clot (thrombus formation), and puncture of an artery; nerve damage and irregular heartbeat and possibility to perform a PICC exchange if needed/ordered by physician.  Alternatives to this procedure were also discussed.  Bard Power PICC patient education guide, fact sheet on infection prevention and patient information card has been provided to patient /or left at bedside.    PICC/Midline Placement Documentation  PICC Single Lumen 37/44/51 PICC Right Basilic 39 cm 0 cm (Active)  Indication for Insertion or Continuance of Line Home intravenous therapies (PICC only) 12/16/2017  1:00 PM  Exposed Catheter (cm) 0 cm 12/16/2017  1:00 PM  Site Assessment Clean;Dry;Intact 12/16/2017  1:00 PM  Line Status Flushed;Blood return noted;Saline locked 12/16/2017  1:00 PM  Dressing Type Transparent 12/16/2017  1:00 PM  Dressing Status Clean;Dry;Intact;Antimicrobial disc in place 12/16/2017  1:00 PM  Line Care Connections checked and tightened 12/16/2017  1:00 PM  Line Adjustment (NICU/IV Team Only) No 12/16/2017  1:00 PM  Dressing Intervention New dressing 12/16/2017  1:00 PM  Dressing Change Due 12/23/17 12/16/2017  1:00 PM       Lorenza Cambridge 12/16/2017, 1:43 PM

## 2017-12-16 NOTE — Progress Notes (Signed)
[facility] TEAM 1 - Stepdown/ICU TEAM  Tyrone Roman  YFV:[id number] DOB: [birth year] DOA: 12/10/2017 PCP: [name], MD    Brief Narrative:  72 y.o.malew/ a hx of RA not on meds and a lumbarmicrodiscetcomy 12/06/17 by Dr. Kathyrn Sheriff who presented to the ED with c/oprogressive back pain 5/10 at rest and 10/10 with movement,AMS, and increased weakness of his LLE.  Subjective: Clinically stable/much improved. Very anxious to go home. PICC placed today. Arrangements being made for home infusion of nafcillin. Mildly confused intermittently, but improving overall.   Assessment & Plan:  Severe Sepsis - MSSA bacteremia due to lumbar wound infection ID managing antibiotics - wound I&D 11/6 - wound looks good per NS - PICC now in place - home nafcillin being arranged - D/C home w/ abx and PT/OT/RN 11/12 if remains stable over night   HNP s/p s/p microdiscectomy 12/06/17 NS following  Acute toxic metabolic encephalopathy likely multifactorial in the setting of infection, recent surgery, sedating medications, and hospital setting - MRI brain w/o acute findings - attempt to avoid sedatives as able - some transient episodes of confusion persist but overall he is improving       AKI  baseline ~ 0.9-1.0 - renal fxn continues to improve   Recent Labs  Lab 12/12/17 0512 12/13/17 0402 12/14/17 0554 12/15/17 0553 12/16/17 0655  CREATININE 1.38* 1.56* 1.54* 1.46* 1.40*    Rheumatoid Arthritis was on Cimza injections in the past but on no focused tx of late   Coronary artery disease    cont statin - ASA on hold  HTN BP trending upward - resume home imdur and follow   GERD  COPD    DVT prophylaxis: SCDs Code Status: FULL CODE Family Communication: no family present at time of exam today  Disposition Plan: stable for tele bed - D/C 11/12 if remains stable over night and all [place] services arranged   Consultants:  ID NS Neuro  Antimicrobials:  Nafcillin 11/7 > Aztreonam  11/5 Vanc 11/5 > 11/6   Objective: Blood pressure (!) 158/80, pulse 100, temperature 99.8 F (37.7 C), temperature source Oral, resp. rate (!) 21, height 6\' 2"  (1.88 m), weight 105.7 kg, SpO2 98 %.  Intake/Output Summary (Last 24 hours) at 12/16/2017 1703 Last data filed at 12/16/2017 1200 Gross per 24 hour  Intake 2953.52 ml  Output 3181 ml  Net -227.48 ml   Filed Weights   12/10/17 1401  Weight: 105.7 kg    Examination: General: NAD - alert/oriented - very mildly confused  Lungs: CTA B - no wheezing  Cardiovascular: RRR w/o M or rub  Abdomen: NT/ND, soft, BS+ Extremities: No signif edema B LE   CBC: Recent Labs  Lab 12/10/17 1418  12/14/17 0554 12/15/17 0553 12/16/17 0655  WBC 5.2   < > 8.5 8.1 8.4  NEUTROABS 3.5  --   --   --   --   HGB 16.2   < > 10.6* 10.8* 10.9*  HCT 49.6   < > 33.5* 33.9* 33.6*  MCV 89.4   < > 88.4 87.8 87.5  PLT 150   < > 164 229 250   < > = values in this interval not displayed.   Basic Metabolic Panel: Recent Labs  Lab 12/14/17 0554 12/15/17 0553 12/16/17 0655  NA 139 141 140  K 3.0* 3.5 3.2*  CL 109 113* 110  CO2 23 24 24   GLUCOSE 138* 123* 114*  BUN 23 18 14   CREATININE 1.54* 1.46*  1.40*  CALCIUM 8.8* 9.0 9.1  MG  --  1.8  --    GFR: Estimated Creatinine Clearance: 61.8 mL/min (A) (by C-G formula based on SCr of 1.4 mg/dL (H)).  Liver Function Tests: Recent Labs  Lab 12/10/17 1418 12/14/17 0554  AST 58* 35  ALT 32 27  ALKPHOS 48 68  BILITOT 1.4* 1.9*  PROT 7.0 4.9*  ALBUMIN 2.9* 1.6*    Recent Labs  Lab 12/10/17 1832 12/14/17 0556  AMMONIA 20 41*    CBG: Recent Labs  Lab 12/10/17 1416  GLUCAP 174*    Recent Results (from the past 240 hour(s))  Blood Culture (routine x 2)     Status: Abnormal   Collection Time: 12/10/17  2:43 PM  Result Value Ref Range Status   Specimen Description BLOOD RIGHT ANTECUBITAL  Final   Special Requests   Final    BOTTLES DRAWN AEROBIC AND ANAEROBIC Blood Culture  adequate volume   Culture  Setup Time   Final    GRAM POSITIVE COCCI IN BOTH AEROBIC AND ANAEROBIC BOTTLES CRITICAL RESULT CALLED TO, READ BACK BY AND VERIFIED WITH: M. PHAM, PHARMD AT 0845 ON 12/11/17 BY C. JESSUP, MLT. Performed at [facility], [place] [address], [place], [place] [address]    Culture STAPHYLOCOCCUS AUREUS (A)  Final   Report Status 12/13/2017 FINAL  Final   Organism ID, Bacteria STAPHYLOCOCCUS AUREUS  Final      Susceptibility   Staphylococcus aureus - MIC*    CIPROFLOXACIN <=0.5 SENSITIVE Sensitive     ERYTHROMYCIN <=0.25 SENSITIVE Sensitive     GENTAMICIN <=0.5 SENSITIVE Sensitive     OXACILLIN <=0.25 SENSITIVE Sensitive     TETRACYCLINE <=1 SENSITIVE Sensitive     VANCOMYCIN <=0.5 SENSITIVE Sensitive     TRIMETH/SULFA <=10 SENSITIVE Sensitive     CLINDAMYCIN <=0.25 SENSITIVE Sensitive     RIFAMPIN <=0.5 SENSITIVE Sensitive     Inducible Clindamycin NEGATIVE Sensitive     * STAPHYLOCOCCUS AUREUS  Blood Culture ID Panel (Reflexed)     Status: Abnormal   Collection Time: 12/10/17  2:43 PM  Result Value Ref Range Status   Enterococcus species NOT DETECTED NOT DETECTED Final   Listeria monocytogenes NOT DETECTED NOT DETECTED Final   Staphylococcus species DETECTED (A) NOT DETECTED Final    Comment: CRITICAL RESULT CALLED TO, READ BACK BY AND VERIFIED WITH: M. PHAM, PHARMD AT 0845 ON 12/11/17 BY C. JESSUP, MLT.    Staphylococcus aureus (BCID) DETECTED (A) NOT DETECTED Final    Comment: Methicillin (oxacillin) susceptible Staphylococcus aureus (MSSA). Preferred therapy is anti staphylococcal beta lactam antibiotic (Cefazolin or Nafcillin), unless clinically contraindicated. CRITICAL RESULT CALLED TO, READ BACK BY AND VERIFIED WITH: M. PHAM, PHARMD AT 0845 ON 12/11/17 BY C. JESSUP, MLT.    Methicillin resistance NOT DETECTED NOT DETECTED Final   Streptococcus species NOT DETECTED NOT DETECTED Final   Streptococcus agalactiae NOT DETECTED NOT DETECTED Final    Streptococcus pneumoniae NOT DETECTED NOT DETECTED Final   Streptococcus pyogenes NOT DETECTED NOT DETECTED Final   Acinetobacter baumannii NOT DETECTED NOT DETECTED Final   Enterobacteriaceae species NOT DETECTED NOT DETECTED Final   Enterobacter cloacae complex NOT DETECTED NOT DETECTED Final   Escherichia coli NOT DETECTED NOT DETECTED Final   Klebsiella oxytoca NOT DETECTED NOT DETECTED Final   Klebsiella pneumoniae NOT DETECTED NOT DETECTED Final   Proteus species NOT DETECTED NOT DETECTED Final   Serratia marcescens NOT DETECTED NOT DETECTED Final   Haemophilus influenzae  NOT DETECTED NOT DETECTED Final   Neisseria meningitidis NOT DETECTED NOT DETECTED Final   Pseudomonas aeruginosa NOT DETECTED NOT DETECTED Final   Candida albicans NOT DETECTED NOT DETECTED Final   Candida glabrata NOT DETECTED NOT DETECTED Final   Candida krusei NOT DETECTED NOT DETECTED Final   Candida parapsilosis NOT DETECTED NOT DETECTED Final   Candida tropicalis NOT DETECTED NOT DETECTED Final    Comment: Performed at [facility], [place] [address], [place], [place] [address]  Blood Culture (routine x 2)     Status: Abnormal   Collection Time: 12/10/17  3:30 PM  Result Value Ref Range Status   Specimen Description BLOOD RIGHT ARM  Final   Special Requests   Final    BOTTLES DRAWN AEROBIC AND ANAEROBIC Blood Culture adequate volume   Culture  Setup Time   Final    GRAM POSITIVE COCCI IN BOTH AEROBIC AND ANAEROBIC BOTTLES CRITICAL RESULT CALLED TO, READ BACK BY AND VERIFIED WITH: M. PHAM, PHARMD, AT 0845 ON 12/11/17 BY C. JESSUP, MLT.    Culture (A)  Final    STAPHYLOCOCCUS AUREUS SUSCEPTIBILITIES PERFORMED ON PREVIOUS CULTURE WITHIN THE LAST 5 DAYS. Performed at [facility], [place] [address], [place], [place] [address]    Report Status 12/13/2017 FINAL  Final  MRSA PCR Screening     Status: None   Collection Time: 12/10/17  9:26 PM  Result Value Ref Range Status   MRSA by PCR NEGATIVE  NEGATIVE Final    Comment:        The GeneXpert MRSA Assay (FDA approved for NASAL specimens only), is one component of a comprehensive MRSA colonization surveillance program. It is not intended to diagnose MRSA infection nor to guide or monitor treatment for MRSA infections. Performed at [facility], [place] [address] [place], [place] [address]   Aerobic Culture (superficial specimen)     Status: None   Collection Time: 12/11/17 10:00 AM  Result Value Ref Range Status   Specimen Description WOUND BACK  Final   Special Requests NONE  Final   Gram Stain   Final    RARE WBC PRESENT, PREDOMINANTLY PMN FEW GRAM POSITIVE COCCI Performed at [facility], [place] [address], [place], [place] [address]    Culture MODERATE STAPHYLOCOCCUS AUREUS  Final   Report Status 12/13/2017 FINAL  Final   Organism ID, Bacteria STAPHYLOCOCCUS AUREUS  Final      Susceptibility   Staphylococcus aureus - MIC*    CIPROFLOXACIN <=0.5 SENSITIVE Sensitive     ERYTHROMYCIN <=0.25 SENSITIVE Sensitive     GENTAMICIN <=0.5 SENSITIVE Sensitive     OXACILLIN 0.5 SENSITIVE Sensitive     TETRACYCLINE <=1 SENSITIVE Sensitive     VANCOMYCIN <=0.5 SENSITIVE Sensitive     TRIMETH/SULFA <=10 SENSITIVE Sensitive     CLINDAMYCIN <=0.25 SENSITIVE Sensitive     RIFAMPIN <=0.5 SENSITIVE Sensitive     Inducible Clindamycin NEGATIVE Sensitive     * MODERATE STAPHYLOCOCCUS AUREUS  Aerobic/Anaerobic Culture (surgical/deep wound)     Status: None (Preliminary result)   Collection Time: 12/11/17  4:09 PM  Result Value Ref Range Status   Specimen Description WOUND  Final   Special Requests NONE  Final   Gram Stain   Final    FEW WBC PRESENT, PREDOMINANTLY PMN FEW GRAM POSITIVE COCCI IN PAIRS IN CLUSTERS Performed at [facility], [address], [place], [place] [address]    Culture   Final  ABUNDANT STAPHYLOCOCCUS AUREUS NO ANAEROBES ISOLATED; CULTURE IN PROGRESS FOR 5 DAYS    Report Status PENDING   Incomplete   Organism ID, Bacteria STAPHYLOCOCCUS AUREUS  Final      Susceptibility   Staphylococcus aureus - MIC*    CIPROFLOXACIN <=0.5 SENSITIVE Sensitive     ERYTHROMYCIN <=0.25 SENSITIVE Sensitive     GENTAMICIN <=0.5 SENSITIVE Sensitive     OXACILLIN 0.5 SENSITIVE Sensitive     TETRACYCLINE <=1 SENSITIVE Sensitive     VANCOMYCIN <=0.5 SENSITIVE Sensitive     TRIMETH/SULFA <=10 SENSITIVE Sensitive     CLINDAMYCIN <=0.25 SENSITIVE Sensitive     RIFAMPIN <=0.5 SENSITIVE Sensitive     Inducible Clindamycin NEGATIVE Sensitive     * ABUNDANT STAPHYLOCOCCUS AUREUS  Aerobic/Anaerobic Culture (surgical/deep wound)     Status: None (Preliminary result)   Collection Time: 12/11/17  4:09 PM  Result Value Ref Range Status   Specimen Description WOUND  Final   Special Requests NONE  Final   Gram Stain   Final    FEW WBC PRESENT, PREDOMINANTLY PMN FEW GRAM POSITIVE COCCI IN PAIRS IN CLUSTERS Performed at [facility], [address], [place], [place] [address]    Culture   Final    MODERATE STAPHYLOCOCCUS AUREUS NO ANAEROBES ISOLATED; CULTURE IN PROGRESS FOR 5 DAYS    Report Status PENDING  Incomplete   Organism ID, Bacteria STAPHYLOCOCCUS AUREUS  Final      Susceptibility   Staphylococcus aureus - MIC*    CIPROFLOXACIN <=0.5 SENSITIVE Sensitive     ERYTHROMYCIN <=0.25 SENSITIVE Sensitive     GENTAMICIN <=0.5 SENSITIVE Sensitive     OXACILLIN 0.5 SENSITIVE Sensitive     TETRACYCLINE <=1 SENSITIVE Sensitive     VANCOMYCIN <=0.5 SENSITIVE Sensitive     TRIMETH/SULFA <=10 SENSITIVE Sensitive     CLINDAMYCIN <=0.25 SENSITIVE Sensitive     RIFAMPIN <=0.5 SENSITIVE Sensitive     Inducible Clindamycin NEGATIVE Sensitive     * MODERATE STAPHYLOCOCCUS AUREUS  Culture, blood (routine x 2)     Status: Abnormal (Preliminary result)   Collection Time: 12/12/17  5:25 AM  Result Value Ref Range Status   Specimen Description BLOOD LEFT HAND  Final   Special Requests   Final    BOTTLES  DRAWN AEROBIC AND ANAEROBIC Blood Culture adequate volume   Culture  Setup Time   Final    GRAM POSITIVE COCCI ANAEROBIC BOTTLE ONLY CRITICAL VALUE NOTED.  VALUE IS CONSISTENT WITH PREVIOUSLY REPORTED AND CALLED VALUE. Performed at [facility], [place] [address] [place], [place] [address]    Culture STAPHYLOCOCCUS AUREUS (A)  Final   Report Status PENDING  Incomplete  Culture, blood (routine x 2)     Status: None (Preliminary result)   Collection Time: 12/12/17  5:35 AM  Result Value Ref Range Status   Specimen Description BLOOD LEFT HAND  Final   Special Requests   Final    BOTTLES DRAWN AEROBIC AND ANAEROBIC Blood Culture adequate volume   Culture   Final    NO GROWTH 4 DAYS Performed at [facility], [place] [address] [place], [place] [address]    Report Status PENDING  Incomplete  Culture, blood (routine x 2)     Status: Abnormal   Collection Time: 12/12/17  9:55 AM  Result Value Ref Range Status   Specimen Description BLOOD LEFT HAND  Final   Special Requests   Final    BOTTLES DRAWN AEROBIC ONLY  Blood Culture results may not be optimal due to an inadequate volume of blood received in culture bottles   Culture  Setup Time   Final    GRAM POSITIVE COCCI AEROBIC BOTTLE ONLY CRITICAL VALUE NOTED.  VALUE IS CONSISTENT WITH PREVIOUSLY REPORTED AND CALLED VALUE.    Culture (A)  Final    STAPHYLOCOCCUS AUREUS SUSCEPTIBILITIES PERFORMED ON PREVIOUS CULTURE WITHIN THE LAST 5 DAYS. Performed at [facility], [place] [address] [place], [place] [address]    Report Status 12/14/2017 FINAL  Final  Culture, blood (routine x 2)     Status: Abnormal   Collection Time: 12/12/17 10:00 AM  Result Value Ref Range Status   Specimen Description BLOOD LEFT HAND  Final   Special Requests   Final    BOTTLES DRAWN AEROBIC AND ANAEROBIC Blood Culture adequate volume   Culture  Setup Time   Final    GRAM POSITIVE COCCI IN BOTH AEROBIC AND ANAEROBIC BOTTLES CRITICAL VALUE NOTED.   VALUE IS CONSISTENT WITH PREVIOUSLY REPORTED AND CALLED VALUE.    Culture (A)  Final    STAPHYLOCOCCUS AUREUS SUSCEPTIBILITIES PERFORMED ON PREVIOUS CULTURE WITHIN THE LAST 5 DAYS. Performed at [facility], [place] [address] [place], [place] [address]    Report Status 12/14/2017 FINAL  Final  Culture, blood (routine x 2)     Status: None (Preliminary result)   Collection Time: 12/14/17  5:46 AM  Result Value Ref Range Status   Specimen Description BLOOD LEFT ANTECUBITAL  Final   Special Requests   Final    BOTTLES DRAWN AEROBIC ONLY Blood Culture adequate volume   Culture   Final    NO GROWTH 2 DAYS Performed at [facility], [place] [address], [place], [place] [address]    Report Status PENDING  Incomplete  Culture, blood (routine x 2)     Status: None (Preliminary result)   Collection Time: 12/14/17  5:54 AM  Result Value Ref Range Status   Specimen Description BLOOD LEFT HAND  Final   Special Requests   Final    BOTTLES DRAWN AEROBIC AND ANAEROBIC Blood Culture adequate volume   Culture   Final    NO GROWTH 2 DAYS Performed at [facility], [place] [address], [place],  [address]    Report Status PENDING  Incomplete     Scheduled Meds: . heparin  5,000 Units Subcutaneous Q8H  . metoprolol succinate  25 mg Oral Daily  . multivitamin with minerals  1 tablet Oral Daily  . pantoprazole  40 mg Oral Daily  . simvastatin  40 mg Oral QPM  . sodium chloride flush  10-40 mL Intracatheter Q12H  . tamsulosin  0.4 mg Oral QPM     LOS: 6 days   Cherene Altes, MD [facility] Office  [phone] Pager - Text Page per Shea Evans  If 7PM-7AM, please contact night-coverage per Amion 12/16/2017, 5:03 PM

## 2017-12-16 NOTE — Progress Notes (Addendum)
PHARMACY CONSULT NOTE FOR:  OUTPATIENT  PARENTERAL ANTIBIOTIC THERAPY (OPAT)  Indication: MSSA bacteremia/back infection  Regimen: Nafcillin 12 gm a day  via continuous infusion  End date: 01/24/18  IV antibiotic discharge orders are pended. To discharging provider:  please sign these orders via discharge navigator,  Select New Orders & click on the button choice - Manage This Unsigned Work.     Thank you for allowing pharmacy to be a part of this patient's care.  Susa Raring, PharmD, BCPS Infectious Diseases Clinical Pharmacist Phone: [phone] 12/16/2017, 2:14 PM

## 2017-12-16 NOTE — Progress Notes (Signed)
Physical Therapy Treatment Patient Details Name: Tyrone Roman MRN: [id number] DOB: [birth year] Today's Date: 12/16/2017    History of Present Illness 72 y.o. male admitted on 12/10/17 for AMS.  Found to have sepsis due to back wound infection.  S/p I and D of lumbar wound on 12/11/17.  Pt's blood cultures (+) for MSSA bacteremia.  Pt with continued AMS post op and neuro consulted.  CT (normal) and MRI (negative), but they are suspicious of narcotic reaction.  Dx with acute metabolic encephalopathy.  Pt with significant PMH of R knee arthroscopy (multiple), RA, MI, HTN dysphagia, COPD, CAD, concussion, lumbar microdiscectomy 10/07/17.      PT Comments    Reinforced all education.  Reinforced better technique with bed mobility and transitions to stand.  Emphasized gait and stairs.    Follow Up Recommendations  Home health PT;Supervision/Assistance - 24 hour     Equipment Recommendations  None recommended by PT    Recommendations for Other Services       Precautions / Restrictions Precautions Precautions: Back Precaution Comments: reviewed log roll    Mobility  Bed Mobility Overal bed mobility: Needs Assistance Bed Mobility: Rolling;Sidelying to Sit;Sit to Sidelying Rolling: Supervision Sidelying to sit: Supervision(with rail)     Sit to sidelying: Min guard General bed mobility comments: reinforced  improved technique with flat bed.  Transfers Overall transfer level: Needs assistance Equipment used: Rolling walker (2 wheeled) Transfers: Sit to/from Stand Sit to Stand: Min guard         General transfer comment: cues for hand placement and raised bed.  Ambulation/Gait Ambulation/Gait assistance: Supervision Gait Distance (Feet): 200 Feet Assistive device: Rolling walker (2 wheeled) Gait Pattern/deviations: Step-through pattern Gait velocity: moderate Gait velocity interpretation: <1.8 ft/sec, indicate of risk for recurrent falls General Gait Details: generally  steady, but tendency to list and drift left without LOB   Stairs Stairs: Yes Stairs assistance: Supervision Stair Management: One rail Right;Step to pattern;Forwards Number of Stairs: 5 General stair comments: safe with the rail   Wheelchair Mobility    Modified Rankin (Stroke Patients Only)       Balance Overall balance assessment: Needs assistance   Sitting balance-Leahy Scale: Good       Standing balance-Leahy Scale: Fair Standing balance comment: prefers external support                            Cognition Arousal/Alertness: Awake/alert Behavior During Therapy: WFL for tasks assessed/performed Overall Cognitive Status: No family/caregiver present to determine baseline cognitive functioning                                 General Comments: Pt seems more alert and oriented today.  Not specifically tested.  He does say some odd things that are out of context every once in a while, but I am not sure that this is not his baseline personality.  No family or friends present to report baseline.       Exercises      General Comments General comments (skin integrity, edema, etc.): Reinforced all advised back care/prec and progression of activity.      Pertinent Vitals/Pain Pain Assessment: Faces Faces Pain Scale: Hurts little more Pain Location: Back Pain Descriptors / Indicators: Grimacing;Guarding Pain Intervention(s): Monitored during session    Home Living  Prior Function            PT Goals (current goals can now be found in the care plan section) Acute Rehab PT Goals Patient Stated Goal: none stated PT Goal Formulation: With patient Time For Goal Achievement: 12/26/17 Potential to Achieve Goals: Good Progress towards PT goals: Progressing toward goals    Frequency    Min 3X/week      PT Plan Current plan remains appropriate    Co-evaluation              AM-PAC PT "6 Clicks"  Daily Activity  Outcome Measure  Difficulty turning over in bed (including adjusting bedclothes, sheets and blankets)?: A Little Difficulty moving from lying on back to sitting on the side of the bed? : A Little Difficulty sitting down on and standing up from a chair with arms (e.g., wheelchair, bedside commode, etc,.)?: A Little Help needed moving to and from a bed to chair (including a wheelchair)?: A Little Help needed walking in hospital room?: A Little Help needed climbing 3-5 steps with a railing? : A Little 6 Click Score: 18    End of Session   Activity Tolerance: Patient tolerated treatment well Patient left: in chair;with call bell/phone within reach;with chair alarm set Nurse Communication: Mobility status PT Visit Diagnosis: Muscle weakness (generalized) (M62.81);Pain;Other symptoms and signs involving the nervous system (R29.898);Difficulty in walking, not elsewhere classified (R26.2) Pain - part of body: (back)     Time: 8616-8372 PT Time Calculation (min) (ACUTE ONLY): 25 min  Charges:  $Gait Training: 8-22 mins $Therapeutic Activity: 8-22 mins                     12/16/2017  Donnella Sham, [place] [phone]  (pager) [phone]  (office)   Tyrone Roman 12/16/2017, 5:48 PM

## 2017-12-16 NOTE — Progress Notes (Addendum)
[facility]ious [facility]   Reason for visit: Follow up on bacteremia  Interval History: repeat blood cultures remain negative > 48 hours; WBC wnl, afebrile; No associated rash, diarrhea.  Some confusion from medications.  Drain to come out today    Physical Exam: Constitutional:  Vitals:   12/16/17 0800 12/16/17 0900  BP: (!) 183/101 (!) 180/100  Pulse: 84 87  Resp: 20   Temp:  97.9 F (36.6 C)  SpO2: 98% 98%   patient appears in NAD Respiratory: Normal respiratory effort; CTA B Cardiovascular: RRR GI: soft, nt, nd Skin: no rashes  Review of Systems: Constitutional: negative for malaise Integument/breast: negative for rash  Lab Results  Component Value Date   WBC 8.4 12/16/2017   HGB 10.9 (L) 12/16/2017   HCT 33.6 (L) 12/16/2017   MCV 87.5 12/16/2017   PLT 250 12/16/2017    Lab Results  Component Value Date   CREATININE 1.40 (H) 12/16/2017   BUN 14 12/16/2017   NA 140 12/16/2017   K 3.2 (L) 12/16/2017   CL 110 12/16/2017   CO2 24 12/16/2017    Lab Results  Component Value Date   ALT 27 12/14/2017   AST 35 12/14/2017   ALKPHOS 68 12/14/2017     Microbiology: Recent Results (from the past 240 hour(s))  Blood Culture (routine x 2)     Status: Abnormal   Collection Time: 12/10/17  2:43 PM  Result Value Ref Range Status   Specimen Description BLOOD RIGHT ANTECUBITAL  Final   Special Requests   Final    BOTTLES DRAWN AEROBIC AND ANAEROBIC Blood Culture adequate volume   Culture  Setup Time   Final    GRAM POSITIVE COCCI IN BOTH AEROBIC AND ANAEROBIC BOTTLES CRITICAL RESULT CALLED TO, READ BACK BY AND VERIFIED WITH: M. PHAM, PHARMD AT 0845 ON 12/11/17 BY C. JESSUP, MLT. Performed at [facility], [place] [address] [place], [place] [address]    Culture STAPHYLOCOCCUS AUREUS (A)  Final   Report Status 12/13/2017 FINAL  Final   Organism ID, Bacteria STAPHYLOCOCCUS AUREUS  Final      Susceptibility   Staphylococcus aureus - MIC*   CIPROFLOXACIN <=0.5 SENSITIVE Sensitive     ERYTHROMYCIN <=0.25 SENSITIVE Sensitive     GENTAMICIN <=0.5 SENSITIVE Sensitive     OXACILLIN <=0.25 SENSITIVE Sensitive     TETRACYCLINE <=1 SENSITIVE Sensitive     VANCOMYCIN <=0.5 SENSITIVE Sensitive     TRIMETH/SULFA <=10 SENSITIVE Sensitive     CLINDAMYCIN <=0.25 SENSITIVE Sensitive     RIFAMPIN <=0.5 SENSITIVE Sensitive     Inducible Clindamycin NEGATIVE Sensitive     * STAPHYLOCOCCUS AUREUS  Blood Culture ID Panel (Reflexed)     Status: Abnormal   Collection Time: 12/10/17  2:43 PM  Result Value Ref Range Status   Enterococcus species NOT DETECTED NOT DETECTED Final   Listeria monocytogenes NOT DETECTED NOT DETECTED Final   Staphylococcus species DETECTED (A) NOT DETECTED Final    Comment: CRITICAL RESULT CALLED TO, READ BACK BY AND VERIFIED WITH: M. PHAM, PHARMD AT 0845 ON 12/11/17 BY C. JESSUP, MLT.    Staphylococcus aureus (BCID) DETECTED (A) NOT DETECTED Final    Comment: Methicillin (oxacillin) susceptible Staphylococcus aureus (MSSA). Preferred therapy is anti staphylococcal beta lactam antibiotic (Cefazolin or Nafcillin), unless clinically contraindicated. CRITICAL RESULT CALLED TO, READ BACK BY AND VERIFIED WITH: M. PHAM, PHARMD AT 0845 ON 12/11/17 BY C. JESSUP, MLT.    Methicillin resistance NOT  DETECTED NOT DETECTED Final   Streptococcus species NOT DETECTED NOT DETECTED Final   Streptococcus agalactiae NOT DETECTED NOT DETECTED Final   Streptococcus pneumoniae NOT DETECTED NOT DETECTED Final   Streptococcus pyogenes NOT DETECTED NOT DETECTED Final   Acinetobacter baumannii NOT DETECTED NOT DETECTED Final   Enterobacteriaceae species NOT DETECTED NOT DETECTED Final   Enterobacter cloacae complex NOT DETECTED NOT DETECTED Final   Escherichia coli NOT DETECTED NOT DETECTED Final   Klebsiella oxytoca NOT DETECTED NOT DETECTED Final   Klebsiella pneumoniae NOT DETECTED NOT DETECTED Final   Proteus species NOT DETECTED NOT  DETECTED Final   Serratia marcescens NOT DETECTED NOT DETECTED Final   Haemophilus influenzae NOT DETECTED NOT DETECTED Final   Neisseria meningitidis NOT DETECTED NOT DETECTED Final   Pseudomonas aeruginosa NOT DETECTED NOT DETECTED Final   Candida albicans NOT DETECTED NOT DETECTED Final   Candida glabrata NOT DETECTED NOT DETECTED Final   Candida krusei NOT DETECTED NOT DETECTED Final   Candida parapsilosis NOT DETECTED NOT DETECTED Final   Candida tropicalis NOT DETECTED NOT DETECTED Final    Comment: Performed at [facility], [place] [address] [place], [place] [address]  Blood Culture (routine x 2)     Status: Abnormal   Collection Time: 12/10/17  3:30 PM  Result Value Ref Range Status   Specimen Description BLOOD RIGHT ARM  Final   Special Requests   Final    BOTTLES DRAWN AEROBIC AND ANAEROBIC Blood Culture adequate volume   Culture  Setup Time   Final    GRAM POSITIVE COCCI IN BOTH AEROBIC AND ANAEROBIC BOTTLES CRITICAL RESULT CALLED TO, READ BACK BY AND VERIFIED WITH: M. PHAM, PHARMD, AT 0845 ON 12/11/17 BY C. JESSUP, MLT.    Culture (A)  Final    STAPHYLOCOCCUS AUREUS SUSCEPTIBILITIES PERFORMED ON PREVIOUS CULTURE WITHIN THE LAST 5 DAYS. Performed at [facility], [place] [address], [place], [place] [address]    Report Status 12/13/2017 FINAL  Final  MRSA PCR Screening     Status: None   Collection Time: 12/10/17  9:26 PM  Result Value Ref Range Status   MRSA by PCR NEGATIVE NEGATIVE Final    Comment:        The GeneXpert MRSA Assay (FDA approved for NASAL specimens only), is one component of a comprehensive MRSA colonization surveillance program. It is not intended to diagnose MRSA infection nor to guide or monitor treatment for MRSA infections. Performed at [facility], [place] [address] [place], [place] [address]   Aerobic Culture (superficial specimen)     Status: None   Collection Time: 12/11/17 10:00 AM  Result Value Ref Range Status    Specimen Description WOUND BACK  Final   Special Requests NONE  Final   Gram Stain   Final    RARE WBC PRESENT, PREDOMINANTLY PMN FEW GRAM POSITIVE COCCI Performed at [facility], [place] [address] [place], [place] [address]    Culture MODERATE STAPHYLOCOCCUS AUREUS  Final   Report Status 12/13/2017 FINAL  Final   Organism ID, Bacteria STAPHYLOCOCCUS AUREUS  Final      Susceptibility   Staphylococcus aureus - MIC*    CIPROFLOXACIN <=0.5 SENSITIVE Sensitive     ERYTHROMYCIN <=0.25 SENSITIVE Sensitive     GENTAMICIN <=0.5 SENSITIVE Sensitive     OXACILLIN 0.5 SENSITIVE Sensitive     TETRACYCLINE <=1 SENSITIVE Sensitive     VANCOMYCIN <=0.5 SENSITIVE Sensitive     TRIMETH/SULFA <=10  SENSITIVE Sensitive     CLINDAMYCIN <=0.25 SENSITIVE Sensitive     RIFAMPIN <=0.5 SENSITIVE Sensitive     Inducible Clindamycin NEGATIVE Sensitive     * MODERATE STAPHYLOCOCCUS AUREUS  Aerobic/Anaerobic Culture (surgical/deep wound)     Status: None (Preliminary result)   Collection Time: 12/11/17  4:09 PM  Result Value Ref Range Status   Specimen Description WOUND  Final   Special Requests NONE  Final   Gram Stain   Final    FEW WBC PRESENT, PREDOMINANTLY PMN FEW GRAM POSITIVE COCCI IN PAIRS IN CLUSTERS Performed at [facility], [address], [place], [place] [address]    Culture   Final    ABUNDANT STAPHYLOCOCCUS AUREUS NO ANAEROBES ISOLATED; CULTURE IN PROGRESS FOR 5 DAYS    Report Status PENDING  Incomplete   Organism ID, Bacteria STAPHYLOCOCCUS AUREUS  Final      Susceptibility   Staphylococcus aureus - MIC*    CIPROFLOXACIN <=0.5 SENSITIVE Sensitive     ERYTHROMYCIN <=0.25 SENSITIVE Sensitive     GENTAMICIN <=0.5 SENSITIVE Sensitive     OXACILLIN 0.5 SENSITIVE Sensitive     TETRACYCLINE <=1 SENSITIVE Sensitive     VANCOMYCIN <=0.5 SENSITIVE Sensitive     TRIMETH/SULFA <=10 SENSITIVE Sensitive     CLINDAMYCIN <=0.25 SENSITIVE Sensitive     RIFAMPIN <=0.5 SENSITIVE Sensitive      Inducible Clindamycin NEGATIVE Sensitive     * ABUNDANT STAPHYLOCOCCUS AUREUS  Aerobic/Anaerobic Culture (surgical/deep wound)     Status: None (Preliminary result)   Collection Time: 12/11/17  4:09 PM  Result Value Ref Range Status   Specimen Description WOUND  Final   Special Requests NONE  Final   Gram Stain   Final    FEW WBC PRESENT, PREDOMINANTLY PMN FEW GRAM POSITIVE COCCI IN PAIRS IN CLUSTERS Performed at [facility], [place] [address] [place], [place] [address]    Culture   Final    MODERATE STAPHYLOCOCCUS AUREUS NO ANAEROBES ISOLATED; CULTURE IN PROGRESS FOR 5 DAYS    Report Status PENDING  Incomplete   Organism ID, Bacteria STAPHYLOCOCCUS AUREUS  Final      Susceptibility   Staphylococcus aureus - MIC*    CIPROFLOXACIN <=0.5 SENSITIVE Sensitive     ERYTHROMYCIN <=0.25 SENSITIVE Sensitive     GENTAMICIN <=0.5 SENSITIVE Sensitive     OXACILLIN 0.5 SENSITIVE Sensitive     TETRACYCLINE <=1 SENSITIVE Sensitive     VANCOMYCIN <=0.5 SENSITIVE Sensitive     TRIMETH/SULFA <=10 SENSITIVE Sensitive     CLINDAMYCIN <=0.25 SENSITIVE Sensitive     RIFAMPIN <=0.5 SENSITIVE Sensitive     Inducible Clindamycin NEGATIVE Sensitive     * MODERATE STAPHYLOCOCCUS AUREUS  Culture, blood (routine x 2)     Status: Abnormal (Preliminary result)   Collection Time: 12/12/17  5:25 AM  Result Value Ref Range Status   Specimen Description BLOOD LEFT HAND  Final   Special Requests   Final    BOTTLES DRAWN AEROBIC AND ANAEROBIC Blood Culture adequate volume   Culture  Setup Time   Final    GRAM POSITIVE COCCI ANAEROBIC BOTTLE ONLY CRITICAL VALUE NOTED.  VALUE IS CONSISTENT WITH PREVIOUSLY REPORTED AND CALLED VALUE. Performed at [facility], [place] [address], [place], [place] [address]    Culture STAPHYLOCOCCUS AUREUS (A)  Final   Report Status PENDING  Incomplete  Culture, blood (routine x 2)     Status: None (Preliminary result)   Collection Time: 12/12/17  5:35 AM  Result  Value Ref Range Status   Specimen Description BLOOD LEFT HAND  Final   Special Requests   Final    BOTTLES DRAWN AEROBIC AND ANAEROBIC Blood Culture adequate volume   Culture   Final    NO GROWTH 4 DAYS Performed at [facility], [address] [place], [place] [address]    Report Status PENDING  Incomplete  Culture, blood (routine x 2)     Status: Abnormal   Collection Time: 12/12/17  9:55 AM  Result Value Ref Range Status   Specimen Description BLOOD LEFT HAND  Final   Special Requests   Final    BOTTLES DRAWN AEROBIC ONLY Blood Culture results may not be optimal due to an inadequate volume of blood received in culture bottles   Culture  Setup Time   Final    GRAM POSITIVE COCCI AEROBIC BOTTLE ONLY CRITICAL VALUE NOTED.  VALUE IS CONSISTENT WITH PREVIOUSLY REPORTED AND CALLED VALUE.    Culture (A)  Final    STAPHYLOCOCCUS AUREUS SUSCEPTIBILITIES PERFORMED ON PREVIOUS CULTURE WITHIN THE LAST 5 DAYS. Performed at [facility], [place] [address], [place], [place] [address]    Report Status 12/14/2017 FINAL  Final  Culture, blood (routine x 2)     Status: Abnormal   Collection Time: 12/12/17 10:00 AM  Result Value Ref Range Status   Specimen Description BLOOD LEFT HAND  Final   Special Requests   Final    BOTTLES DRAWN AEROBIC AND ANAEROBIC Blood Culture adequate volume   Culture  Setup Time   Final    GRAM POSITIVE COCCI IN BOTH AEROBIC AND ANAEROBIC BOTTLES CRITICAL VALUE NOTED.  VALUE IS CONSISTENT WITH PREVIOUSLY REPORTED AND CALLED VALUE.    Culture (A)  Final    STAPHYLOCOCCUS AUREUS SUSCEPTIBILITIES PERFORMED ON PREVIOUS CULTURE WITHIN THE LAST 5 DAYS. Performed at [facility], [place] [address], [place], [place] [address]    Report Status 12/14/2017 FINAL  Final  Culture, blood (routine x 2)     Status: None (Preliminary result)   Collection Time: 12/14/17  5:46 AM  Result Value Ref Range Status   Specimen Description BLOOD LEFT ANTECUBITAL  Final    Special Requests   Final    BOTTLES DRAWN AEROBIC ONLY Blood Culture adequate volume   Culture   Final    NO GROWTH 2 DAYS Performed at [facility], [place] [address] [place], [place] [address]    Report Status PENDING  Incomplete  Culture, blood (routine x 2)     Status: None (Preliminary result)   Collection Time: 12/14/17  5:54 AM  Result Value Ref Range Status   Specimen Description BLOOD LEFT HAND  Final   Special Requests   Final    BOTTLES DRAWN AEROBIC AND ANAEROBIC Blood Culture adequate volume   Culture   Final    NO GROWTH 2 DAYS Performed at [facility], [place] [address] [place], [place] [address]    Report Status PENDING  Incomplete    Impression/Plan:  1. Bacteremia - repeat cultures negative now and with the deep infection, recommend treatment for 6 weeks through December 20th.   Will continue with nafcillin.   If there are any side effects with nafcillin in the future, would need to change to vancomycin or daptomycin due to cephalosporin allergy; so far though tolerating well.   2.  Post op infection - cultures with same MSSA as in blood.  Pus tracking down to lamina.  Treatment as above.    3.  Access - ok for picc line today, I mentioned to patient would wait until tomorrow but I think picc today is fine.  I will put order in.    4.  Allergy - ok on nafcillin.    I will arrange follow up with me in about 2-3 weeks.

## 2017-12-16 NOTE — Progress Notes (Signed)
  NEUROSURGERY PROGRESS NOTE   No issues overnight.  Feels much better overall Pain well controlled Eager for d/c  EXAM:  BP (!) 155/80 (BP Location: Right Arm)   Pulse 90   Temp 100 F (37.8 C) (Oral)   Resp 19   Ht 6\' 2"  (1.88 m)   Wt 105.7 kg   SpO2 97%   BMI 29.92 kg/m   Awake, alert, oriented  Speech fluent, appropriate  CN grossly intact  5/5 BUE/BLE  Incision c/d/i Drain still in place  PLAN Much improved since admission. Appreciate TH and ID management Cleared for d/c from NS perspective with 1 week follow up Will remove drain

## 2017-12-17 ENCOUNTER — Encounter (HOSPITAL_COMMUNITY): Payer: Self-pay

## 2017-12-17 LAB — CBC
HCT: 34 % — ABNORMAL LOW (ref 39.0–52.0)
Hemoglobin: 11.1 g/dL — ABNORMAL LOW (ref 13.0–17.0)
MCH: 28.8 pg (ref 26.0–34.0)
MCHC: 32.6 g/dL (ref 30.0–36.0)
MCV: 88.1 fL (ref 80.0–100.0)
Platelets: 310 10*3/uL (ref 150–400)
RBC: 3.86 MIL/uL — ABNORMAL LOW (ref 4.22–5.81)
RDW: 14.6 % (ref 11.5–15.5)
WBC: 9.8 10*3/uL (ref 4.0–10.5)
nRBC: 0 % (ref 0.0–0.2)

## 2017-12-17 LAB — CULTURE, BLOOD (ROUTINE X 2)
Culture: NO GROWTH
Special Requests: ADEQUATE
Special Requests: ADEQUATE

## 2017-12-17 LAB — AEROBIC/ANAEROBIC CULTURE W GRAM STAIN (SURGICAL/DEEP WOUND)

## 2017-12-17 LAB — BASIC METABOLIC PANEL
Anion gap: 10 (ref 5–15)
BUN: 14 mg/dL (ref 8–23)
CO2: 22 mmol/L (ref 22–32)
Calcium: 9.1 mg/dL (ref 8.9–10.3)
Chloride: 105 mmol/L (ref 98–111)
Creatinine, Ser: 1.43 mg/dL — ABNORMAL HIGH (ref 0.61–1.24)
GFR calc Af Amer: 55 mL/min — ABNORMAL LOW (ref >60)
GFR calc non Af Amer: 47 mL/min — ABNORMAL LOW (ref >60)
Glucose, Bld: 105 mg/dL — ABNORMAL HIGH (ref 70–99)
Potassium: 3 mmol/L — ABNORMAL LOW (ref 3.5–5.1)
Sodium: 137 mmol/L (ref 135–145)

## 2017-12-17 LAB — AEROBIC/ANAEROBIC CULTURE (SURGICAL/DEEP WOUND)

## 2017-12-17 MED ORDER — NAFCILLIN IV (FOR PTA / DISCHARGE USE ONLY): 12.0000 g | INTRAVENOUS | 0 refills | Status: DC

## 2017-12-17 MED ORDER — ACETAMINOPHEN 325 MG PO TABS: 650.0000 mg | ORAL_TABLET | Freq: Four times a day (QID) | ORAL | Status: DC | PRN

## 2017-12-17 MED ORDER — POTASSIUM CHLORIDE CRYS ER 20 MEQ PO TBCR
40.0000 meq | EXTENDED_RELEASE_TABLET | Freq: Once | ORAL | Status: AC
  Administered 2017-12-17: 40 meq via ORAL
  Filled 2017-12-17: qty 2

## 2017-12-17 NOTE — Discharge Instructions (Signed)
Bacteremia °Bacteremia is the presence of bacteria in the blood. When bacteria enter the bloodstream, they can cause a life-threatening reaction called sepsis, which is a medical emergency. Bacteremia can spread to other parts of the body, including the heart, joints, and brain. °What are the causes? °This condition is caused by bacteria that get into the blood. Bacteria can enter the blood: °· From a skin infection or a cut on your skin. °· During an episode of pneumonia. °· From an infection in your stomach or intestine (gastrointestinal infection). °· From an infection in your bladder or urinary system (urinary tract infection). °· During a dental or medical procedure. °· After you brush your teeth so hard that your gums bleed. °· When a bacterial infection in another part of the body spreads to the blood. °· Through a dirty needle. ° °What increases the risk? °This condition is more likely to develop in: °· Children. °· Elderly adults. °· People who have a long-lasting (chronic) disease or medical condition. °· People who have an artificial joint or heart valve. °· People who have heart valve disease. °· People who have a tube, such as a catheter or IV tube, that has been inserted for a medical treatment. °· People who have a weak body defense system (immune system). °· People who use IV drugs. ° °What are the signs or symptoms? °Symptoms of this condition include: °· Fever. °· Chills. °· A racing heart. °· Shortness of breath. °· Dizziness. °· Weakness. °· Confusion. °· Nausea or vomiting. °· Diarrhea. ° °In some cases, there are no symptoms. Bacteremia that has spread to the other parts of the body may cause symptoms in those areas. °How is this diagnosed? °This condition may be diagnosed with a physical exam and tests, such as: °· A complete blood count (CBC). This test looks for signs of infection. °· Blood cultures. These look for bacteria in your blood. °· Tests of any tubes that you may have inserted into  your body, such as an IV tube or urinary catheter. These tests look for a source of infection. °· Urine tests including urine cultures. These look for bacteria in the urine that could be a source of infection. °· Imaging tests, such as an X-ray, CT scan, MRI, or heart ultrasound. These look for a source of infection in other parts of the body, such as the lungs, heart valves, or joints. ° °How is this treated? °This condition may be treated with: °· Antibiotic medicines given through an IV infusion. Depending on the source of infection, antibiotics may be needed for several weeks. At first, an antibiotic may be given to kill most types of blood bacteria. If your test results show that a certain kind of bacteria is causing problems, the antibiotic may be changed to kill only the bacteria that are causing problems. °· Antibiotics taken by mouth. °· IV fluids to support the body as you fight the infection. °· Removing any catheter or device that could be a source of infection. °· Blood pressure and breathing support, if you have sepsis. °· Surgery to control the source or spread of infection, such as: °? Removing an infected implanted device. °? Removing infected tissue or an abscess. ° °This condition is usually treated at a hospital. If you are treated at home, you may need to come back for medicines, blood tests, and evaluation. This is important. °Follow these instructions at home: °· Take over-the-counter and prescription medicines only as told by your health care provider. °·   If you were prescribed an antibiotic, take it as told by your health care provider. Do not stop taking the antibiotic even if you start to feel better. °· Rest until your condition is under control. °· Drink enough fluid to keep your urine clear or pale yellow. °· Do not smoke. If you need help quitting, ask your health care provider. °· Keep all follow-up visits as told by your health care provider. This is important. °How is this  prevented? °· Get the vaccinations that your health care provider recommends. °· Clean and cover any scrapes or cuts. °· Take good care of your skin. This includes regular bathing and moisturizing. °· Wash your hands often. °· Practice good oral hygiene. Brush your teeth two times a day and floss regularly. °Get help right away if: °· You have pain. °· You have a fever. °· You have trouble breathing. °· Your skin becomes blotchy, pale, or clammy. °· You develop confusion, dizziness, or weakness. °· You develop diarrhea. °· You develop any new symptoms after treatment. °Summary °· Bacteremia is the presence of bacteria in the blood. When bacteria enter the bloodstream, they can cause a life- threatening reaction called sepsis. °· Children and elderly adults are at increased risk of bacteremia. Other risk factors include having a long-lasting (chronic) disease or a weak immune system, having an artificial joint or heart valve, having heart valve disease, having tubes that were inserted in the body for medical treatment, or using IV drugs. °· Some symptoms of bacteremia include fever, chills, shortness of breath, confusion, nausea or vomiting, and diarrhea. °· Tests may be done to diagnose a source of infection that led to bacteremia. These tests may include blood tests, urine tests, and imaging tests. °· Bacteremia is usually treated with antibiotics, usually in a hospital. °This information is not intended to replace advice given to you by your health care provider. Make sure you discuss any questions you have with your health care provider. °Document Released: 11/05/2005 Document Revised: 12/20/2015 Document Reviewed: 12/20/2015 °[employer] Interactive Patient Education © 2018 [employer] ° °

## 2017-12-17 NOTE — Discharge Summary (Signed)
DISCHARGE SUMMARY  Tyrone Roman  MR#: [id number]  DOB:[birth year]  Date of Admission: 12/10/2017 Date of Discharge: 12/17/2017  Attending Physician:Kenan Moodie Hennie Duos, MD  Patient's PCP:Fry, Ishmael Holter, MD  Consults: ID NS Neuro  Disposition: D/C home w/ HH abx dosing   Follow-up Appts: Follow-up Information    [name], MD Follow up in 1 week(s).   Specialty:  Family Medicine Contact information: [place] [address] [phone]        Thayer Headings, MD Follow up.   Specialty:  Infectious Diseases Why:  Dr. Henreitta Leber office will contact you to arrange a follow-up apointment in 2-3 weeks. If you have not heard from his office by 12/24/17, please call the number above.  Contact information: [address] [place] [address] [phone]        Consuella Lose, MD Follow up in 1 week(s).   Specialty:  Neurosurgery Why:  Please call the office to arrange a wound check in 1 weeks w/ Dr. Kathyrn Sheriff.  Contact information: 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 Kathyrn Sheriff who presented to the ED withc/oprogressive back pain 5/10 at rest and 10/10 with movement,AMS, and increased weakness of his LLE.  Hospital Course:  Severe Sepsis - MSSA bacteremia due to lumbar wound infection ID managed antibiotics - wound I&D 11/6 per NS - wound looks good per NS - PICC now in place - home nafcillin arranged per home health agency - D/C home w/ abx and PT/OT/RN - to f/u in ID clinic in 2-3  weeks (Dr. 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 NS followed th/o the hospital stay   Acute toxic metabolic encephalopathy likely multifactorial in the setting of infection, recent surgery, sedating medications, and hospital setting - MRI brain w/o acute findings - attempt to avoid sedatives as able - some transient episodes of confusion persist but overall he is improving       AKI  baseline ~ 0.9-1.0 - renal fxn stable at ~1.4 at time of d/c   Rheumatoid Arthritis was on Cimza injections in the past but on no focused tx of late - no acute flairs during admit   Coronary artery disease   cont statin - resume ASA at d/c - asymptomatic during admit   HTN CP controlled at time of d/c at 120/85 - cont home medical regimen    Allergies as of 12/17/2017      Reactions   Cephalexin Shortness Of Breath, Rash, Other (See Comments)   Tolerated Augmentin 2015 and 2017 (12-2017). Tolerated nafcillin 12/2017 PATIENT HAS HAD A PCN REACTION WITH IMMEDIATE RASH, FACIAL/TONGUE/THROAT SWELLING, SOB, OR LIGHTHEADEDNESS WITH HYPOTENSION:  #  #  YES  #  #  Has patient had a PCN reaction causing severe rash involving mucus membranes or skin necrosis: No Has patient had a PCN reaction that required hospitalization: No Has patient had a PCN reaction occurring within the last 10 years: No If all of the above answers are "NO", then may proceed   Clarithromycin Shortness Of Breath, Rash  Certolizumab Pegol Hives   Doxycycline Rash   Hydroxyzine Rash   Lidoderm Other (See Comments)   "made me act weird"   Pyrithione Zinc Rash      Medication List    STOP taking these medications   leflunomide 20 MG tablet Commonly known as:  ARAVA   naproxen 500 MG tablet Commonly known as:  NAPROSYN   oxyCODONE-acetaminophen 7.5-325 MG tablet Commonly known as:  PERCOCET   predniSONE 5 MG tablet Commonly known as:  DELTASONE     TAKE these medications   acetaminophen 325 MG tablet Commonly  known as:  TYLENOL Take 2 tablets (650 mg total) by mouth every 6 (six) hours as needed for mild pain, fever or headache.   aspirin EC 81 MG tablet Take 1 tablet (81 mg total) by mouth daily.   cetirizine 10 MG tablet Commonly known as:  ZYRTEC Take 10 mg by mouth daily.   isosorbide mononitrate 30 MG 24 hr tablet Commonly known as:  IMDUR Take 1 tablet (30 mg total) by mouth daily.   methocarbamol 500 MG tablet Commonly known as:  ROBAXIN Take 500 mg by mouth every 6 (six) hours as needed for muscle spasms.   metoprolol succinate 25 MG 24 hr tablet Commonly known as:  TOPROL-XL TAKE 1 TABLET BY MOUTH DAILY   multivitamin with minerals Tabs tablet Take 1 tablet by mouth daily.   nafcillin  IVPB Inject 12 g into the vein daily. As a continuous infusion. Indication:  MSSA bacteremia/back infection Last Day of Therapy:  01/24/18 Labs - Once weekly:  CBC/D and BMP, Labs - Every other week:  ESR and CRP   nitroGLYCERIN 0.4 MG SL tablet Commonly known as:  NITROSTAT Place 1 tablet (0.4 mg total) under the tongue every 5 (five) minutes as needed for chest pain (3 doses max).   omeprazole 40 MG capsule Commonly known as:  PRILOSEC Take 40 mg by mouth daily.   sildenafil 20 MG tablet Commonly known as:  REVATIO Take 20 mg by mouth daily as needed What changed:    how much to take  how to take this  when to take this  reasons to take this  additional instructions   simvastatin 40 MG tablet Commonly known as:  ZOCOR Take 1 tablet (40 mg total) by mouth daily. What changed:  when to take this   tamsulosin 0.4 MG Caps capsule Commonly known as:  FLOMAX TAKE ONE CAPSULE BY MOUTH DAILY   zolpidem 10 MG tablet Commonly known as:  AMBIEN TAKE ONE TABLET BY MOUTH EVERY NIGHT AT BEDTIME            Home Infusion Instuctions  (From admission, onward)         Start     Ordered   12/16/17 0000  Home infusion instructions [facility] May follow [facility] Dosing Protocol; May administer Cathflo as needed to maintain patency of vascular access device.; Flushing of vascular access device: per [facility] Protocol: 0.9% NaCl pre/post medica...    Question Answer Comment  Instructions May follow [facility] Dosing Protocol   Instructions May administer Cathflo as needed to maintain patency of vascular access device.   Instructions Flushing of vascular access device: per [facility] Protocol: 0.9% NaCl pre/post medication administration and prn patency; Heparin 100 u/ml, 34m for implanted ports and Heparin 10u/ml, 560mfor all other central venous catheters.   Instructions May follow [facility] Anaphylaxis Protocol for First Dose Administration in the home: 0.9% NaCl  at 25-50 ml/hr to maintain IV access for protocol meds. Epinephrine 0.3 ml IV/IM PRN and Benadryl 25-50 IV/IM PRN s/s of anaphylaxis.   Instructions [facility] Infusion Coordinator (RN) to assist per patient IV care needs in the home PRN.      12/16/17 1648          Day of Discharge BP 120/85 (BP Location: Left Arm)   Pulse (!) 102   Temp 98.1 F (36.7 C) (Oral)   Resp 11   Ht _0  (1.88 m)   Wt 105.7 kg   SpO2 95%   BMI 29.92 kg/m   Physical Exam: General: No acute respiratory distress Lungs: Clear to auscultation bilaterally Cardiovascular: Regular rate and rhythm without murmur  Abdomen: Nontender, nondistended, soft, bowel sounds positive Extremities: No significant cyanosis, clubbing, or edema bilateral lower extremities  Basic Metabolic Panel: Recent Labs  Lab 12/13/17 0402 12/14/17 0554 12/15/17 0553 12/16/17 0655 12/17/17 0500  NA 139 139 141 140 137  K 3.6 3.0* 3.5 3.2* 3.0*  CL 110 109 113* 110 105  CO2 _1 GLUCOSE 118* 138* 123* 114* 105*  BUN 26* _2 CREATININE 1.56* 1.54* 1.46* 1.40* 1.43*  CALCIUM 9.1 8.8* 9.0 9.1 9.1  MG  --   --  1.8  --   --     Liver Function Tests: Recent Labs  Lab 12/10/17 1418 12/14/17 0554  AST 58*  35  ALT 32 27  ALKPHOS 48 68  BILITOT 1.4* 1.9*  PROT 7.0 4.9*  ALBUMIN 2.9* 1.6*    Recent Labs  Lab 12/10/17 1832 12/14/17 0556  AMMONIA 20 41*    CBC: Recent Labs  Lab 12/10/17 1418  12/13/17 0402 12/14/17 0554 12/15/17 0553 12/16/17 0655 12/17/17 0500  WBC 5.2   < > 7.9 8.5 8.1 8.4 9.8  NEUTROABS 3.5  --   --   --   --   --   --   HGB 16.2   < > 11.6* 10.6* 10.8* 10.9* 11.1*  HCT 49.6   < > 36.7* 33.5* 33.9* 33.6* 34.0*  MCV 89.4   < > 89.1 88.4 87.8 87.5 88.1  PLT 150   < > 112* 164 229 250 310   < > = values in this interval not displayed.    CBG: Recent Labs  Lab 12/10/17 1416  GLUCAP 174*    Recent Results (from the past 240 hour(s))  Blood Culture (routine x 2)     Status: Abnormal   Collection Time: 12/10/17  2:43 PM  Result Value Ref Range Status   Specimen Description BLOOD RIGHT ANTECUBITAL  Final   Special Requests   Final    BOTTLES DRAWN AEROBIC AND ANAEROBIC Blood Culture adequate volume   Culture  Setup Time   Final    GRAM POSITIVE COCCI IN BOTH AEROBIC AND ANAEROBIC BOTTLES CRITICAL RESULT CALLED TO, READ BACK BY AND VERIFIED WITH: M. PHAM, PHARMD AT 0845 ON 12/11/17 BY C. JESSUP, MLT. Performed at [facility], [place] [address], [place], [place] [address]    Culture STAPHYLOCOCCUS AUREUS (A)  Final   Report Status 12/13/2017 FINAL  Final   Organism ID, Bacteria STAPHYLOCOCCUS AUREUS  Final      Susceptibility   Staphylococcus aureus - MIC*    CIPROFLOXACIN <=0.5 SENSITIVE Sensitive     ERYTHROMYCIN <=0.25 SENSITIVE Sensitive     GENTAMICIN <=0.5 SENSITIVE Sensitive     OXACILLIN <=0.25  SENSITIVE Sensitive     TETRACYCLINE <=1 SENSITIVE Sensitive     VANCOMYCIN <=0.5 SENSITIVE Sensitive     TRIMETH/SULFA <=10 SENSITIVE Sensitive     CLINDAMYCIN <=0.25 SENSITIVE Sensitive     RIFAMPIN <=0.5 SENSITIVE Sensitive     Inducible Clindamycin NEGATIVE Sensitive     * STAPHYLOCOCCUS AUREUS  Blood Culture ID Panel (Reflexed)      Status: Abnormal   Collection Time: 12/10/17  2:43 PM  Result Value Ref Range Status   Enterococcus species NOT DETECTED NOT DETECTED Final   Listeria monocytogenes NOT DETECTED NOT DETECTED Final   Staphylococcus species DETECTED (A) NOT DETECTED Final    Comment: CRITICAL RESULT CALLED TO, READ BACK BY AND VERIFIED WITH: M. PHAM, PHARMD AT 0845 ON 12/11/17 BY C. JESSUP, MLT.    Staphylococcus aureus (BCID) DETECTED (A) NOT DETECTED Final    Comment: Methicillin (oxacillin) susceptible Staphylococcus aureus (MSSA). Preferred therapy is anti staphylococcal beta lactam antibiotic (Cefazolin or Nafcillin), unless clinically contraindicated. CRITICAL RESULT CALLED TO, READ BACK BY AND VERIFIED WITH: M. PHAM, PHARMD AT 0845 ON 12/11/17 BY C. JESSUP, MLT.    Methicillin resistance NOT DETECTED NOT DETECTED Final   Streptococcus species NOT DETECTED NOT DETECTED Final   Streptococcus agalactiae NOT DETECTED NOT DETECTED Final   Streptococcus pneumoniae NOT DETECTED NOT DETECTED Final   Streptococcus pyogenes NOT DETECTED NOT DETECTED Final   Acinetobacter baumannii NOT DETECTED NOT DETECTED Final   Enterobacteriaceae species NOT DETECTED NOT DETECTED Final   Enterobacter cloacae complex NOT DETECTED NOT DETECTED Final   Escherichia coli NOT DETECTED NOT DETECTED Final   Klebsiella oxytoca NOT DETECTED NOT DETECTED Final   Klebsiella pneumoniae NOT DETECTED NOT DETECTED Final   Proteus species NOT DETECTED NOT DETECTED Final   Serratia marcescens NOT DETECTED NOT DETECTED Final   Haemophilus influenzae NOT DETECTED NOT DETECTED Final   Neisseria meningitidis NOT DETECTED NOT DETECTED Final   Pseudomonas aeruginosa NOT DETECTED NOT DETECTED Final   Candida albicans NOT DETECTED NOT DETECTED Final   Candida glabrata NOT DETECTED NOT DETECTED Final   Candida krusei NOT DETECTED NOT DETECTED Final   Candida parapsilosis NOT DETECTED NOT DETECTED Final   Candida tropicalis NOT DETECTED NOT  DETECTED Final    Comment: Performed at [facility], [place] [address] [place], [place] [address]  Blood Culture (routine x 2)     Status: Abnormal   Collection Time: 12/10/17  3:30 PM  Result Value Ref Range Status   Specimen Description BLOOD RIGHT ARM  Final   Special Requests   Final    BOTTLES DRAWN AEROBIC AND ANAEROBIC Blood Culture adequate volume   Culture  Setup Time   Final    GRAM POSITIVE COCCI IN BOTH AEROBIC AND ANAEROBIC BOTTLES CRITICAL RESULT CALLED TO, READ BACK BY AND VERIFIED WITH: M. PHAM, PHARMD, AT 0845 ON 12/11/17 BY C. JESSUP, MLT.    Culture (A)  Final    STAPHYLOCOCCUS AUREUS SUSCEPTIBILITIES PERFORMED ON PREVIOUS CULTURE WITHIN THE LAST 5 DAYS. Performed at [facility], [place] [address], [place], [place] [address]    Report Status 12/13/2017 FINAL  Final  MRSA PCR Screening     Status: None   Collection Time: 12/10/17  9:26 PM  Result Value Ref Range Status   MRSA by PCR NEGATIVE NEGATIVE Final    Comment:        The GeneXpert MRSA Assay (FDA approved for NASAL specimens only), is one component of a comprehensive  MRSA colonization surveillance program. It is not intended to diagnose MRSA infection nor to guide or monitor treatment for MRSA infections. Performed at [facility], [place] [address] [place], [place] [address]   Aerobic Culture (superficial specimen)     Status: None   Collection Time: 12/11/17 10:00 AM  Result Value Ref Range Status   Specimen Description WOUND BACK  Final   Special Requests NONE  Final   Gram Stain   Final    RARE WBC PRESENT, PREDOMINANTLY PMN FEW GRAM POSITIVE COCCI Performed at [facility], [place] [address], [place], [place] [address]    Culture MODERATE STAPHYLOCOCCUS AUREUS  Final   Report Status 12/13/2017 FINAL  Final   Organism ID, Bacteria STAPHYLOCOCCUS AUREUS  Final      Susceptibility   Staphylococcus aureus - MIC*    CIPROFLOXACIN <=0.5 SENSITIVE Sensitive     ERYTHROMYCIN  <=0.25 SENSITIVE Sensitive     GENTAMICIN <=0.5 SENSITIVE Sensitive     OXACILLIN 0.5 SENSITIVE Sensitive     TETRACYCLINE <=1 SENSITIVE Sensitive     VANCOMYCIN <=0.5 SENSITIVE Sensitive     TRIMETH/SULFA <=10 SENSITIVE Sensitive     CLINDAMYCIN <=0.25 SENSITIVE Sensitive     RIFAMPIN <=0.5 SENSITIVE Sensitive     Inducible Clindamycin NEGATIVE Sensitive     * MODERATE STAPHYLOCOCCUS AUREUS  Aerobic/Anaerobic Culture (surgical/deep wound)     Status: None   Collection Time: 12/11/17  4:09 PM  Result Value Ref Range Status   Specimen Description WOUND  Final   Special Requests NONE  Final   Gram Stain   Final    FEW WBC PRESENT, PREDOMINANTLY PMN FEW GRAM POSITIVE COCCI IN PAIRS IN CLUSTERS    Culture   Final    ABUNDANT STAPHYLOCOCCUS AUREUS NO ANAEROBES ISOLATED Performed at [facility], [place] [address], [place], [place] [address]    Report Status 12/17/2017 FINAL  Final   Organism ID, Bacteria STAPHYLOCOCCUS AUREUS  Final      Susceptibility   Staphylococcus aureus - MIC*    CIPROFLOXACIN <=0.5 SENSITIVE Sensitive     ERYTHROMYCIN <=0.25 SENSITIVE Sensitive     GENTAMICIN <=0.5 SENSITIVE Sensitive     OXACILLIN 0.5 SENSITIVE Sensitive     TETRACYCLINE <=1 SENSITIVE Sensitive     VANCOMYCIN <=0.5 SENSITIVE Sensitive     TRIMETH/SULFA <=10 SENSITIVE Sensitive     CLINDAMYCIN <=0.25 SENSITIVE Sensitive     RIFAMPIN <=0.5 SENSITIVE Sensitive     Inducible Clindamycin NEGATIVE Sensitive     * ABUNDANT STAPHYLOCOCCUS AUREUS  Aerobic/Anaerobic Culture (surgical/deep wound)     Status: None   Collection Time: 12/11/17  4:09 PM  Result Value Ref Range Status   Specimen Description WOUND  Final   Special Requests NONE  Final   Gram Stain   Final    FEW WBC PRESENT, PREDOMINANTLY PMN FEW GRAM POSITIVE COCCI IN PAIRS IN CLUSTERS    Culture   Final    MODERATE STAPHYLOCOCCUS AUREUS NO ANAEROBES ISOLATED Performed at [facility], [place] [address] [place],  [place] [address]    Report Status 12/17/2017 FINAL  Final   Organism ID, Bacteria STAPHYLOCOCCUS AUREUS  Final      Susceptibility   Staphylococcus aureus - MIC*    CIPROFLOXACIN <=0.5 SENSITIVE Sensitive     ERYTHROMYCIN <=0.25 SENSITIVE Sensitive     GENTAMICIN <=0.5 SENSITIVE Sensitive     OXACILLIN 0.5 SENSITIVE Sensitive     TETRACYCLINE <=1 SENSITIVE  Sensitive     VANCOMYCIN <=0.5 SENSITIVE Sensitive     TRIMETH/SULFA <=10 SENSITIVE Sensitive     CLINDAMYCIN <=0.25 SENSITIVE Sensitive     RIFAMPIN <=0.5 SENSITIVE Sensitive     Inducible Clindamycin NEGATIVE Sensitive     * MODERATE STAPHYLOCOCCUS AUREUS  Culture, blood (routine x 2)     Status: Abnormal   Collection Time: 12/12/17  5:25 AM  Result Value Ref Range Status   Specimen Description BLOOD LEFT HAND  Final   Special Requests   Final    BOTTLES DRAWN AEROBIC AND ANAEROBIC Blood Culture adequate volume   Culture  Setup Time   Final    GRAM POSITIVE COCCI ANAEROBIC BOTTLE ONLY CRITICAL VALUE NOTED.  VALUE IS CONSISTENT WITH PREVIOUSLY REPORTED AND CALLED VALUE. Performed at [facility], [place] [address] [place], [place] [address]    Culture STAPHYLOCOCCUS AUREUS (A)  Final   Report Status 12/17/2017 FINAL  Final   Organism ID, Bacteria STAPHYLOCOCCUS AUREUS  Final      Susceptibility   Staphylococcus aureus - MIC*    CIPROFLOXACIN <=0.5 SENSITIVE Sensitive     ERYTHROMYCIN <=0.25 SENSITIVE Sensitive     GENTAMICIN <=0.5 SENSITIVE Sensitive     OXACILLIN <=0.25 SENSITIVE Sensitive     TETRACYCLINE <=1 SENSITIVE Sensitive     VANCOMYCIN <=0.5 SENSITIVE Sensitive     TRIMETH/SULFA <=10 SENSITIVE Sensitive     CLINDAMYCIN <=0.25 SENSITIVE Sensitive     RIFAMPIN <=0.5 SENSITIVE Sensitive     Inducible Clindamycin NEGATIVE Sensitive     * STAPHYLOCOCCUS AUREUS  Culture, blood (routine x 2)     Status: None   Collection Time: 12/12/17  5:35 AM  Result Value Ref Range Status   Specimen Description BLOOD LEFT HAND   Final   Special Requests   Final    BOTTLES DRAWN AEROBIC AND ANAEROBIC Blood Culture adequate volume   Culture   Final    NO GROWTH 5 DAYS Performed at [facility], [address], [place], [place] [address]    Report Status 12/17/2017 FINAL  Final  Culture, blood (routine x 2)     Status: Abnormal   Collection Time: 12/12/17  9:55 AM  Result Value Ref Range Status   Specimen Description BLOOD LEFT HAND  Final   Special Requests   Final    BOTTLES DRAWN AEROBIC ONLY Blood Culture results may not be optimal due to an inadequate volume of blood received in culture bottles   Culture  Setup Time   Final    GRAM POSITIVE COCCI AEROBIC BOTTLE ONLY CRITICAL VALUE NOTED.  VALUE IS CONSISTENT WITH PREVIOUSLY REPORTED AND CALLED VALUE.    Culture (A)  Final    STAPHYLOCOCCUS AUREUS SUSCEPTIBILITIES PERFORMED ON PREVIOUS CULTURE WITHIN THE LAST 5 DAYS. Performed at [facility], [place] [address], [place], [place] [address]    Report Status 12/14/2017 FINAL  Final  Culture, blood (routine x 2)     Status: Abnormal   Collection Time: 12/12/17 10:00 AM  Result Value Ref Range Status   Specimen Description BLOOD LEFT HAND  Final   Special Requests   Final    BOTTLES DRAWN AEROBIC AND ANAEROBIC Blood Culture adequate volume   Culture  Setup Time   Final    GRAM POSITIVE COCCI IN BOTH AEROBIC AND ANAEROBIC BOTTLES CRITICAL VALUE NOTED.  VALUE IS CONSISTENT WITH PREVIOUSLY REPORTED AND CALLED VALUE.    Culture (A)  Final  STAPHYLOCOCCUS AUREUS SUSCEPTIBILITIES PERFORMED ON PREVIOUS CULTURE WITHIN THE LAST 5 DAYS. Performed at [facility], [place] [address], [place], [place] [address]    Report Status 12/14/2017 FINAL  Final  Culture, blood (routine x 2)     Status: None (Preliminary result)   Collection Time: 12/14/17  5:46 AM  Result Value Ref Range Status   Specimen Description BLOOD LEFT ANTECUBITAL  Final   Special Requests   Final    BOTTLES DRAWN AEROBIC ONLY  Blood Culture adequate volume   Culture   Final    NO GROWTH 3 DAYS Performed at [facility], [place] [address], [place], [place] [address]    Report Status PENDING  Incomplete  Culture, blood (routine x 2)     Status: None (Preliminary result)   Collection Time: 12/14/17  5:54 AM  Result Value Ref Range Status   Specimen Description BLOOD LEFT HAND  Final   Special Requests   Final    BOTTLES DRAWN AEROBIC AND ANAEROBIC Blood Culture adequate volume   Culture   Final    NO GROWTH 3 DAYS Performed at [facility], [place] [address], [place], [place] [address]    Report Status PENDING  Incomplete    Time spent in discharge (includes decision making & examination of pt): 35 minutes  12/17/2017, 1:53 PM   Cherene Altes, MD [facility] Office  [phone] Pager [phone]  On-Call/Text Page:      [email or web address]      password [facility]

## 2017-12-17 NOTE — Progress Notes (Signed)
Occupational Therapy Treatment Patient Details Name: Tyrone Roman MRN: [id number] DOB: [birth year] Today's Date: 12/17/2017    History of present illness 72 y.o. male admitted on 12/10/17 for AMS.  Found to have sepsis due to back wound infection.  S/p I and D of lumbar wound on 12/11/17.  Pt's blood cultures (+) for MSSA bacteremia.  Pt with continued AMS post op and neuro consulted.  CT (normal) and MRI (negative), but they are suspicious of narcotic reaction.  Dx with acute metabolic encephalopathy.  Pt with significant PMH of R knee arthroscopy (multiple), RA, MI, HTN dysphagia, COPD, CAD, concussion, lumbar microdiscectomy 10/07/17.     OT comments  Completed ADL session with pt/daughter, including management of portable PICC/bag during dressing. Pt will need 3in1 and RW for safe DC home. Pt seen by OT previously and told OT that he had equipment which he apparently did not have. Nsg notified. Continue to recommend [place] for follow up.   Follow Up Recommendations  Home health OT;Supervision/Assistance - 24 hour    Equipment Recommendations  3 in 1 bedside commode;Other (comment)(RW)    Recommendations for Other Services      Precautions / Restrictions Precautions Precautions: Back       Mobility Bed Mobility Overal bed mobility: Needs Assistance Bed Mobility: Rolling;Sidelying to Sit Rolling: Modified independent (Device/Increase time) Sidelying to sit: Min assist          Transfers Overall transfer level: Needs assistance Equipment used: Rolling walker (2 wheeled) Transfers: Sit to/from Stand Sit to Stand: Min guard         General transfer comment: vc for hand placement    Balance Overall balance assessment: Needs assistance   Sitting balance-Leahy Scale: Fair       Standing balance-Leahy Scale: Fair                             ADL either performed or assessed with clinical judgement   ADL Overall ADL's : Needs assistance/impaired                                      Functional mobility during ADLs: Min guard General ADL Comments: Completed ADL session wtih pt/daughter to educate on managemetn of ADL and portable PICC bag; Pt requires overall min A wtih LB bathing and mod vc for safety  Recommend daughter use gait belt with her father - issued gait belt; recommend daughter walk on his weaker R side     Vision       Perception     Praxis      Cognition Arousal/Alertness: Awake/alert Behavior During Therapy: Flat affect Overall Cognitive Status: Impaired/Different from baseline Area of Impairment: Safety/judgement;Awareness                         Safety/Judgement: Decreased awareness of safety;Decreased awareness of deficits Awareness: Emergent            Exercises     Shoulder Instructions       General Comments      Pertinent Vitals/ Pain       Pain Assessment: Faces Faces Pain Scale: Hurts a little bit Pain Location: Back Pain Descriptors / Indicators: Grimacing;Guarding Pain Intervention(s): Limited activity within patient's tolerance  Home Living  Prior Functioning/Environment              Frequency  Min 3X/week        Progress Toward Goals  OT Goals(current goals can now be found in the care plan section)  Progress towards OT goals: Progressing toward goals  Acute Rehab OT Goals Patient Stated Goal: to go home OT Goal Formulation: With patient Time For Goal Achievement: 12/26/17 Potential to Achieve Goals: Good ADL Goals Pt Will Perform Lower Body Dressing: with set-up;with supervision;sit to/from stand Pt Will Transfer to Toilet: with set-up;with supervision;ambulating;bedside commode Pt Will Perform Toileting - Clothing Manipulation and hygiene: with set-up;with supervision;sit to/from stand Additional ADL Goal #1: pt will perform bed mobility demonstrating log roll techniques with  supervision in preparation for ADLs.  Plan Discharge plan remains appropriate    Co-evaluation                 AM-PAC PT "6 Clicks" Daily Activity     Outcome Measure   Help from another person eating meals?: None Help from another person taking care of personal grooming?: A Little Help from another person toileting, which includes using toliet, bedpan, or urinal?: A Little Help from another person bathing (including washing, rinsing, drying)?: A Little Help from another person to put on and taking off regular upper body clothing?: A Little Help from another person to put on and taking off regular lower body clothing?: A Little 6 Click Score: 19    End of Session Equipment Utilized During Treatment: Gait belt;Rolling walker  OT Visit Diagnosis: Unsteadiness on feet (R26.81);Other abnormalities of gait and mobility (R26.89);Muscle weakness (generalized) (M62.81);Pain Pain - part of body: (back)   Activity Tolerance Patient tolerated treatment well   Patient Left in chair;with call bell/phone within reach;with family/visitor present   Nurse Communication Mobility status;Other (comment)(DME needs)        Time: 5631-4970 OT Time Calculation (min): 30 min  Charges: OT General Charges $OT Visit: 1 Visit OT Treatments $Self Care/Home Management : 23-37 mins  Maurie Boettcher, OT/L   Acute OT Clinical Specialist [place] Pager [phone] Office [phone]    [facility] 12/17/2017, 5:26 PM

## 2017-12-17 NOTE — Progress Notes (Signed)
Pt being d/c home. AVS and prescriptions given. Pt and daughter verbalizes and demonstrates understanding. Vitals stable. Pt being taken out via staff. Daughter transporting pt home.

## 2017-12-17 NOTE — Care Management Important Message (Signed)
Important Message  Patient Details  Name: Tyrone Roman MRN: [id number] Date of Birth: March 22, 1945   Medicare Important Message Given:  Yes    [name] 12/17/2017, 9:14 AM

## 2017-12-17 NOTE — Care Management Note (Addendum)
Case Management Note  Patient Details  Name: GALO SAYED MRN: [id number] Date of Birth: 10/04/1945  Subjective/Objective:  72 y.o. male admitted on 12/10/17 for AMS.  Found to have sepsis due to back wound infection.  S/p I and D of lumbar wound on 12/11/17.  Pt's blood cultures (+) for MSSA bacteremia.  PTA, pt independent, lives alone.                    Action/Plan: PT/OT recommending HH follow up with 24h supervision.  Pt will also need HHRN follow up for IV antibiotics at home.  Pt states he has a girlfriend and two daughters who should be able to assist with IV abx administration at home.  Referral to [facility] for [facility] follow up and need for IV antibiotics/teaching.  MD:  Please enter HHRN, PT, and OT orders with face to face documentation for Medicare.  HHRN for home IV antibiotics and PICC line care.  Will also need separate hardcopy Rx for IV antibiotic at discharge.  Expected Discharge Date:     12/17/17             Expected Discharge Plan:  [place]  In-House Referral:     Discharge planning Services  CM Consult  Post Acute Care Choice:  Home Health Choice offered to:  Patient  DME Arranged:  IV pump/equipment, RW, 3 in 1 [facility] DME Agency:  [place]:  RN, PT, OT, IV Antibiotics HH Agency:  [place]  Status of Service:  Completed, signed off  If discussed at [place] of Stay Meetings, dates discussed:    Additional Comments:  12/17/17 J. Oren Section, RN, BSN PICC line placed on 12/16/17; plan teaching session with [facility] IV infusion coordinator with pt and pt's girlfriend at 1:30pm.  Infusion coordinator to bring IV meds to hospital for pt to take home with him.  Start of care for HH24-48h post dc date.  Pt ready for dc after teaching session.  1530  12/17/17 J.Ronnie Doo, RN, BSN Pt requests RW and 3 in 1 for home.  Orders obtained; referral to [facility] for DME needs; DME to be delivered to pt's room prior to dc home.    Reinaldo Raddle, RN, BSN  Trauma/Neuro ICU Case Manager [phone]

## 2017-12-18 DIAGNOSIS — R6520 Severe sepsis without septic shock: Secondary | ICD-10-CM | POA: Diagnosis not present

## 2017-12-18 DIAGNOSIS — I252 Old myocardial infarction: Secondary | ICD-10-CM | POA: Diagnosis not present

## 2017-12-18 DIAGNOSIS — G92 Toxic encephalopathy: Secondary | ICD-10-CM | POA: Diagnosis not present

## 2017-12-18 DIAGNOSIS — R7881 Bacteremia: Secondary | ICD-10-CM | POA: Diagnosis not present

## 2017-12-18 DIAGNOSIS — N179 Acute kidney failure, unspecified: Secondary | ICD-10-CM | POA: Diagnosis not present

## 2017-12-18 DIAGNOSIS — Z452 Encounter for adjustment and management of vascular access device: Secondary | ICD-10-CM | POA: Diagnosis not present

## 2017-12-18 DIAGNOSIS — Z792 Long term (current) use of antibiotics: Secondary | ICD-10-CM | POA: Diagnosis not present

## 2017-12-18 DIAGNOSIS — Z7982 Long term (current) use of aspirin: Secondary | ICD-10-CM | POA: Diagnosis not present

## 2017-12-18 DIAGNOSIS — I10 Essential (primary) hypertension: Secondary | ICD-10-CM | POA: Diagnosis not present

## 2017-12-18 DIAGNOSIS — J449 Chronic obstructive pulmonary disease, unspecified: Secondary | ICD-10-CM | POA: Diagnosis not present

## 2017-12-18 DIAGNOSIS — Z87891 Personal history of nicotine dependence: Secondary | ICD-10-CM | POA: Diagnosis not present

## 2017-12-18 DIAGNOSIS — I2510 Atherosclerotic heart disease of native coronary artery without angina pectoris: Secondary | ICD-10-CM | POA: Diagnosis not present

## 2017-12-18 DIAGNOSIS — T8141XA Infection following a procedure, superficial incisional surgical site, initial encounter: Secondary | ICD-10-CM | POA: Diagnosis not present

## 2017-12-18 DIAGNOSIS — Z5181 Encounter for therapeutic drug level monitoring: Secondary | ICD-10-CM | POA: Diagnosis not present

## 2017-12-18 DIAGNOSIS — M0689 Other specified rheumatoid arthritis, multiple sites: Secondary | ICD-10-CM | POA: Diagnosis not present

## 2017-12-18 DIAGNOSIS — B9562 Methicillin resistant Staphylococcus aureus infection as the cause of diseases classified elsewhere: Secondary | ICD-10-CM | POA: Diagnosis not present

## 2017-12-19 ENCOUNTER — Telehealth: Payer: Self-pay | Admitting: Family Medicine

## 2017-12-19 ENCOUNTER — Telehealth: Payer: Self-pay

## 2017-12-19 ENCOUNTER — Ambulatory Visit: Payer: Self-pay

## 2017-12-19 DIAGNOSIS — T8141XA Infection following a procedure, superficial incisional surgical site, initial encounter: Secondary | ICD-10-CM | POA: Diagnosis not present

## 2017-12-19 DIAGNOSIS — R7881 Bacteremia: Secondary | ICD-10-CM | POA: Diagnosis not present

## 2017-12-19 DIAGNOSIS — N179 Acute kidney failure, unspecified: Secondary | ICD-10-CM | POA: Diagnosis not present

## 2017-12-19 DIAGNOSIS — R6520 Severe sepsis without septic shock: Secondary | ICD-10-CM | POA: Diagnosis not present

## 2017-12-19 DIAGNOSIS — B9562 Methicillin resistant Staphylococcus aureus infection as the cause of diseases classified elsewhere: Secondary | ICD-10-CM | POA: Diagnosis not present

## 2017-12-19 DIAGNOSIS — G92 Toxic encephalopathy: Secondary | ICD-10-CM | POA: Diagnosis not present

## 2017-12-19 LAB — CULTURE, BLOOD (ROUTINE X 2)
Culture: NO GROWTH
Culture: NO GROWTH
Special Requests: ADEQUATE
Special Requests: ADEQUATE

## 2017-12-19 NOTE — Telephone Encounter (Signed)
Tyrone Roman, Physical Therapist called office today stating patient woke up with a temp. Of 100.3. Patient states he woke up sweating. Took some extra strength Tylenol on 11/13 at night. Tyrone Roman to contact PCP since we have not seen patient in clinic yet, and since Dr. Linus Salmons is not in the office. Also gave Tyrone Roman Tyrone Roman pager incase she needed further instructions. Patient states he will call PCP to see if he can schedule an appointment for today.  Benld

## 2017-12-19 NOTE — Telephone Encounter (Signed)
I am encouraged by the fact that he is already on IV antibiotics. As long as he feels okay, he should keep the appt with me tomorrow. If he gets any worse, he should go to the ER

## 2017-12-19 NOTE — Telephone Encounter (Signed)
Attempted to call patient but the mailbox is full.  Will try again at another time.

## 2017-12-19 NOTE — Telephone Encounter (Signed)
Claiborne Billings, PT from [place] called to report the patient's elevated temp 100.3. She says he was discharged from the hospital on 12/17/17 for sepsis and he's on home IV antibiotics at present. Other VS BP 124/76, P 79. R 16, O2 sat 95% room air. She says he's alert, oriented, incision dressing is dry, no drainage noted. She says he has a hospital follow up appointment tomorrow, but she's wanting to know if he can be seen today. She says she called the infectious disease clinic and they advised her to call his PCP for the temp. I advised I will have to call the office to see if he can be seen in the office today due to the time allotted for hospital follow up is 30 minutes and there are only 15 minute slots available. I called and spoke to [facility] who says Dr. Sarajane Jews is in a room and she will let him know what is going on and will call the patient back. I advised Claiborne Billings of the above, she verbalized understanding. Best CB # for patient is [phone], his number listed in the profile is not active at this time, but it is to stay on the profile, because the patient will have it working later today.

## 2017-12-19 NOTE — Telephone Encounter (Signed)
Copied from [place] [id number]. Topic: Quick Communication - Home Health Verbal Orders >> Dec 19, 2017 10:12 AM Conception Chancy, NT wrote: Caller/Agency: [facility] Number: [phone] Requesting OT/PT/Skilled Nursing/Social Work: physical therapy Frequency: 1 week 1, 2 week 1, 1 week 2

## 2017-12-19 NOTE — Telephone Encounter (Signed)
Patient is aware and will go to the ED if no improvement or keep his appointment tomorrow if possible.

## 2017-12-20 ENCOUNTER — Encounter: Payer: Self-pay | Admitting: Family Medicine

## 2017-12-20 ENCOUNTER — Ambulatory Visit (INDEPENDENT_AMBULATORY_CARE_PROVIDER_SITE_OTHER): Payer: Medicare Other | Admitting: Family Medicine

## 2017-12-20 VITALS — BP 120/80 | HR 98 | Temp 98.5°F | Ht 74.0 in | Wt 206.0 lb

## 2017-12-20 DIAGNOSIS — M5126 Other intervertebral disc displacement, lumbar region: Secondary | ICD-10-CM

## 2017-12-20 DIAGNOSIS — N179 Acute kidney failure, unspecified: Secondary | ICD-10-CM

## 2017-12-20 DIAGNOSIS — M25551 Pain in right hip: Secondary | ICD-10-CM

## 2017-12-20 DIAGNOSIS — R7881 Bacteremia: Secondary | ICD-10-CM | POA: Diagnosis not present

## 2017-12-20 DIAGNOSIS — I25118 Atherosclerotic heart disease of native coronary artery with other forms of angina pectoris: Secondary | ICD-10-CM

## 2017-12-20 DIAGNOSIS — B9561 Methicillin susceptible Staphylococcus aureus infection as the cause of diseases classified elsewhere: Secondary | ICD-10-CM

## 2017-12-20 LAB — BASIC METABOLIC PANEL
BUN: 15 mg/dL (ref 6–23)
CO2: 27 mEq/L (ref 19–32)
Calcium: 9.6 mg/dL (ref 8.4–10.5)
Chloride: 104 mEq/L (ref 96–112)
Creatinine, Ser: 1.28 mg/dL (ref 0.40–1.50)
GFR: 58.62 mL/min — ABNORMAL LOW (ref >60.00)
Glucose, Bld: 100 mg/dL — ABNORMAL HIGH (ref 70–99)
Potassium: 3.1 mEq/L — ABNORMAL LOW (ref 3.5–5.1)
Sodium: 143 mEq/L (ref 135–145)

## 2017-12-20 MED ORDER — NITROGLYCERIN 0.4 MG SL SUBL: 0.4000 mg | SUBLINGUAL_TABLET | SUBLINGUAL | 3 refills | Status: DC | PRN

## 2017-12-20 NOTE — Telephone Encounter (Signed)
Please okay these orders  ?
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
## 2017-12-23 DIAGNOSIS — G92 Toxic encephalopathy: Secondary | ICD-10-CM | POA: Diagnosis not present

## 2017-12-23 DIAGNOSIS — T8141XA Infection following a procedure, superficial incisional surgical site, initial encounter: Secondary | ICD-10-CM | POA: Diagnosis not present

## 2017-12-23 DIAGNOSIS — R6520 Severe sepsis without septic shock: Secondary | ICD-10-CM | POA: Diagnosis not present

## 2017-12-23 DIAGNOSIS — R7881 Bacteremia: Secondary | ICD-10-CM | POA: Diagnosis not present

## 2017-12-23 DIAGNOSIS — B9562 Methicillin resistant Staphylococcus aureus infection as the cause of diseases classified elsewhere: Secondary | ICD-10-CM | POA: Diagnosis not present

## 2017-12-23 DIAGNOSIS — N179 Acute kidney failure, unspecified: Secondary | ICD-10-CM | POA: Diagnosis not present

## 2017-12-24 ENCOUNTER — Encounter (INDEPENDENT_AMBULATORY_CARE_PROVIDER_SITE_OTHER): Payer: Self-pay | Admitting: Orthopaedic Surgery

## 2017-12-24 ENCOUNTER — Ambulatory Visit (INDEPENDENT_AMBULATORY_CARE_PROVIDER_SITE_OTHER): Payer: Medicare Other

## 2017-12-24 ENCOUNTER — Ambulatory Visit (INDEPENDENT_AMBULATORY_CARE_PROVIDER_SITE_OTHER): Payer: Medicare Other | Admitting: Orthopaedic Surgery

## 2017-12-24 ENCOUNTER — Telehealth: Payer: Self-pay | Admitting: Family Medicine

## 2017-12-24 DIAGNOSIS — M25551 Pain in right hip: Secondary | ICD-10-CM

## 2017-12-24 DIAGNOSIS — I25118 Atherosclerotic heart disease of native coronary artery with other forms of angina pectoris: Secondary | ICD-10-CM

## 2017-12-24 MED ORDER — CYCLOBENZAPRINE HCL 5 MG PO TABS: 5.0000 mg | ORAL_TABLET | Freq: Three times a day (TID) | ORAL | 3 refills | Status: DC | PRN

## 2017-12-24 MED ORDER — TRAMADOL HCL 50 MG PO TABS: 50.0000 mg | ORAL_TABLET | Freq: Three times a day (TID) | ORAL | 2 refills | Status: DC | PRN

## 2017-12-24 MED ORDER — POTASSIUM CHLORIDE CRYS ER 20 MEQ PO TBCR: 20.0000 meq | EXTENDED_RELEASE_TABLET | Freq: Every day | ORAL | 3 refills | Status: DC

## 2017-12-24 NOTE — Telephone Encounter (Signed)
Copied from [place] [id number]. Topic: Quick Communication - Lab Results (Clinic Use ONLY) >> Dec 23, 2017  3:16 PM Lennox Solders wrote: Pt is calling and would like blood work results >> Dec 23, 2017  3:19 PM Lennox Solders wrote: Pt said base on blood work results md will call in some medication
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
## 2017-12-24 NOTE — Telephone Encounter (Signed)
Left detailed message on machine for [facility] with verbal orders for PT.

## 2017-12-24 NOTE — Telephone Encounter (Signed)
Called and spoke with pt and he is aware of lab results and to start on the potassium once daily.  He is aware that he will need to come in for recheck in 30 days for potassium level.  He will call for this appt.

## 2017-12-25 DIAGNOSIS — R7881 Bacteremia: Secondary | ICD-10-CM | POA: Diagnosis not present

## 2017-12-25 DIAGNOSIS — R6520 Severe sepsis without septic shock: Secondary | ICD-10-CM | POA: Diagnosis not present

## 2017-12-25 DIAGNOSIS — G92 Toxic encephalopathy: Secondary | ICD-10-CM | POA: Diagnosis not present

## 2017-12-25 DIAGNOSIS — N179 Acute kidney failure, unspecified: Secondary | ICD-10-CM | POA: Diagnosis not present

## 2017-12-25 DIAGNOSIS — B9562 Methicillin resistant Staphylococcus aureus infection as the cause of diseases classified elsewhere: Secondary | ICD-10-CM | POA: Diagnosis not present

## 2017-12-25 DIAGNOSIS — T8141XA Infection following a procedure, superficial incisional surgical site, initial encounter: Secondary | ICD-10-CM | POA: Diagnosis not present

## 2017-12-26 ENCOUNTER — Telehealth: Payer: Self-pay | Admitting: Family Medicine

## 2017-12-26 DIAGNOSIS — G92 Toxic encephalopathy: Secondary | ICD-10-CM | POA: Diagnosis not present

## 2017-12-26 DIAGNOSIS — R7881 Bacteremia: Secondary | ICD-10-CM | POA: Diagnosis not present

## 2017-12-26 DIAGNOSIS — T8141XA Infection following a procedure, superficial incisional surgical site, initial encounter: Secondary | ICD-10-CM | POA: Diagnosis not present

## 2017-12-26 DIAGNOSIS — B9562 Methicillin resistant Staphylococcus aureus infection as the cause of diseases classified elsewhere: Secondary | ICD-10-CM | POA: Diagnosis not present

## 2017-12-26 DIAGNOSIS — R6520 Severe sepsis without septic shock: Secondary | ICD-10-CM | POA: Diagnosis not present

## 2017-12-26 DIAGNOSIS — N179 Acute kidney failure, unspecified: Secondary | ICD-10-CM | POA: Diagnosis not present

## 2017-12-26 NOTE — Telephone Encounter (Signed)
Copied from [place] [id number]. Topic: Quick Communication - Home Health Verbal Orders >> Dec 26, 2017  3:28 PM Berneta Levins wrote: Caller/Agency: Curt Bears from [place] Number: [phone], OK to leave a message Requesting OT/PT/Skilled Nursing/Social Work: OT Frequency: 1 week 2, 2 week 2

## 2017-12-27 ENCOUNTER — Ambulatory Visit: Payer: Self-pay

## 2017-12-27 ENCOUNTER — Ambulatory Visit (INDEPENDENT_AMBULATORY_CARE_PROVIDER_SITE_OTHER): Payer: Self-pay | Admitting: Orthopaedic Surgery

## 2017-12-27 DIAGNOSIS — R7881 Bacteremia: Secondary | ICD-10-CM | POA: Diagnosis not present

## 2017-12-27 DIAGNOSIS — G92 Toxic encephalopathy: Secondary | ICD-10-CM | POA: Diagnosis not present

## 2017-12-27 DIAGNOSIS — B9562 Methicillin resistant Staphylococcus aureus infection as the cause of diseases classified elsewhere: Secondary | ICD-10-CM | POA: Diagnosis not present

## 2017-12-27 DIAGNOSIS — R6520 Severe sepsis without septic shock: Secondary | ICD-10-CM | POA: Diagnosis not present

## 2017-12-27 DIAGNOSIS — N179 Acute kidney failure, unspecified: Secondary | ICD-10-CM | POA: Diagnosis not present

## 2017-12-27 DIAGNOSIS — T8141XA Infection following a procedure, superficial incisional surgical site, initial encounter: Secondary | ICD-10-CM | POA: Diagnosis not present

## 2017-12-27 MED ORDER — CIPROFLOXACIN HCL 500 MG PO TABS: 500.0000 mg | ORAL_TABLET | Freq: Two times a day (BID) | ORAL | 0 refills | Status: DC

## 2017-12-27 NOTE — Telephone Encounter (Signed)
Patient was called because Curt Jews with [facility] called and stated that during her visit with the patient today his BP was 160/105 then went down to 148/100. She states patient is in a lot of pain today. She had already left pt before calling into office. I spoke to the patient and he says he took something for pain already and his BP went down. He says the nurse took a urine sample and will be sending the results to Dr. Sarajane Jews. I advised he will receive a call with the results and Dr. Sarajane Jews recommendation, he verbalized understanding. Patient did say he was running a temperature 100.8.   Answer Assessment - Initial Assessment Questions 1. SEVERITY: "How bad is the pain?"  (e.g., Scale 1-10; mild, moderate, or severe)   - MILD (1-3): complains slightly about urination hurting   - MODERATE (4-7): interferes with normal activities     - SEVERE (8-10): excruciating, unwilling or unable to urinate because of the pain      4 2. FREQUENCY: "How many times have you had painful urination today?"      4 3. PATTERN: "Is pain present every time you urinate or just sometimes?"      Everytime 4. ONSET: "When did the painful urination start?"      On and off a couple of days ago, but constant now 5. FEVER: "Do you have a fever?" If so, ask: "What is your temperature, how was it measured, and when did it start?"     Slight fever, 100.8 6. PAST UTI: "Have you had a urine infection before?" If so, ask: "When was the last time?" and "What happened that time?"      Yes a while ago 7. CAUSE: "What do you think is causing the painful urination?"      Think I have a bladder infections 8. OTHER SYMPTOMS: "Do you have any other symptoms?" (e.g., flank pain, penile discharge, scrotal pain, blood in urine)     Back pain on both sides, dark urine  Protocols used: URINATION PAIN - MALE-A-AH

## 2017-12-27 NOTE — Telephone Encounter (Signed)
I have called to give Tyrone Roman the VO for the OT for this pt.  Nothing further is needed.

## 2017-12-27 NOTE — Telephone Encounter (Signed)
Call in Cipro 500 mg bid for 10 days  

## 2017-12-27 NOTE — Telephone Encounter (Signed)
Please advise 

## 2017-12-27 NOTE — Telephone Encounter (Signed)
Dr. Fry please advise. Thanks  

## 2017-12-27 NOTE — Addendum Note (Signed)
Addended by: Elie Confer on: 12/27/2017 05:03 PM   Modules accepted: Orders

## 2017-12-27 NOTE — Telephone Encounter (Signed)
Pt. Reports he started having burning with urination, frequency and pain over his bladder this morning. Denies fever. States he is already on IV antibiotics at home. States "it is very difficult for him to get to the office for a OV." Has home health nurses coming twice a week to check his PICC line. Wants to know if something can be done for his bladder symtoms. Please advise pt. Answer Assessment - Initial Assessment Questions 1. SEVERITY: "How bad is the pain?"  (e.g., Scale 1-10; mild, moderate, or severe)   - MILD (1-3): complains slightly about urination hurting   - MODERATE (4-7): interferes with normal activities     - SEVERE (8-10): excruciating, unwilling or unable to urinate because of the pain      7-8 over the bladder 2. FREQUENCY: "How many times have you had painful urination today?"      5-6 times 3. PATTERN: "Is pain present every time you urinate or just sometimes?"      Yes 4. ONSET: "When did the painful urination start?"      This morning 5. FEVER: "Do you have a fever?" If so, ask: "What is your temperature, how was it measured, and when did it start?"     No 6. PAST UTI: "Have you had a urine infection before?" If so, ask: "When was the last time?" and "What happened that time?"      Yes 7. CAUSE: "What do you think is causing the painful urination?"      Bladder infection 8. OTHER SYMPTOMS: "Do you have any other symptoms?" (e.g., flank pain, penile discharge, scrotal pain, blood in urine)     Pain over bladder area  Protocols used: URINATION PAIN - MALE-A-AH

## 2017-12-27 NOTE — Telephone Encounter (Signed)
Called and spoike with pt and he is aware of rx that has been sent to the pharmacy

## 2017-12-27 NOTE — Telephone Encounter (Signed)
Please Advise

## 2017-12-30 DIAGNOSIS — B9562 Methicillin resistant Staphylococcus aureus infection as the cause of diseases classified elsewhere: Secondary | ICD-10-CM | POA: Diagnosis not present

## 2017-12-30 DIAGNOSIS — R6520 Severe sepsis without septic shock: Secondary | ICD-10-CM | POA: Diagnosis not present

## 2017-12-30 DIAGNOSIS — T8141XA Infection following a procedure, superficial incisional surgical site, initial encounter: Secondary | ICD-10-CM | POA: Diagnosis not present

## 2017-12-30 DIAGNOSIS — N179 Acute kidney failure, unspecified: Secondary | ICD-10-CM | POA: Diagnosis not present

## 2017-12-30 DIAGNOSIS — R7881 Bacteremia: Secondary | ICD-10-CM | POA: Diagnosis not present

## 2017-12-30 DIAGNOSIS — G92 Toxic encephalopathy: Secondary | ICD-10-CM | POA: Diagnosis not present

## 2017-12-31 ENCOUNTER — Other Ambulatory Visit: Payer: Self-pay | Admitting: Family Medicine

## 2017-12-31 DIAGNOSIS — T8141XA Infection following a procedure, superficial incisional surgical site, initial encounter: Secondary | ICD-10-CM | POA: Diagnosis not present

## 2017-12-31 DIAGNOSIS — R7881 Bacteremia: Secondary | ICD-10-CM | POA: Diagnosis not present

## 2017-12-31 DIAGNOSIS — B9562 Methicillin resistant Staphylococcus aureus infection as the cause of diseases classified elsewhere: Secondary | ICD-10-CM | POA: Diagnosis not present

## 2017-12-31 DIAGNOSIS — R6520 Severe sepsis without septic shock: Secondary | ICD-10-CM | POA: Diagnosis not present

## 2017-12-31 DIAGNOSIS — G92 Toxic encephalopathy: Secondary | ICD-10-CM | POA: Diagnosis not present

## 2017-12-31 DIAGNOSIS — N179 Acute kidney failure, unspecified: Secondary | ICD-10-CM | POA: Diagnosis not present

## 2017-12-31 MED ORDER — SIMVASTATIN 40 MG PO TABS: 40.0000 mg | ORAL_TABLET | Freq: Every day | ORAL | 2 refills | Status: DC

## 2017-12-31 NOTE — Telephone Encounter (Signed)
Copied from [place] [id number] [phone]. Topic: Quick Communication - Rx Refill/Question >> Dec 31, 2017  4:39 PM Waylan Rocher, [place] L wrote: Medication: simvastatin (ZOCOR) 40 MG tablet (last pill tonight)  Has the patient contacted their pharmacy? Yes.   (Agent: If no, request that the patient contact the pharmacy for the refill.) (Agent: If yes, when and what did the pharmacy advise?) tried to contact us twice  Preferred Pharmacy (with phone number or street name): [facility], [place] [address] Phone: [phone] Fax: [phone]  Agent: Please be advised that RX refills may take up to 3 business days. We ask that you follow-up with your pharmacy.

## 2018-01-01 ENCOUNTER — Ambulatory Visit (INDEPENDENT_AMBULATORY_CARE_PROVIDER_SITE_OTHER): Payer: Medicare Other | Admitting: Internal Medicine

## 2018-01-01 ENCOUNTER — Encounter: Payer: Self-pay | Admitting: Internal Medicine

## 2018-01-01 DIAGNOSIS — B9561 Methicillin susceptible Staphylococcus aureus infection as the cause of diseases classified elsewhere: Secondary | ICD-10-CM

## 2018-01-01 DIAGNOSIS — Z5181 Encounter for therapeutic drug level monitoring: Secondary | ICD-10-CM | POA: Insufficient documentation

## 2018-01-01 DIAGNOSIS — I25118 Atherosclerotic heart disease of native coronary artery with other forms of angina pectoris: Secondary | ICD-10-CM

## 2018-01-01 DIAGNOSIS — Z452 Encounter for adjustment and management of vascular access device: Secondary | ICD-10-CM

## 2018-01-01 DIAGNOSIS — R7881 Bacteremia: Secondary | ICD-10-CM

## 2018-01-01 DIAGNOSIS — T8149XA Infection following a procedure, other surgical site, initial encounter: Secondary | ICD-10-CM

## 2018-01-01 NOTE — Assessment & Plan Note (Signed)
Repeat blood cultures remained negative.  On prolonged treatment.

## 2018-01-01 NOTE — Assessment & Plan Note (Signed)
Healing well.  Closed incision and no new concerns.  Will continue as planned through 12/20 and consider stopping then vs continuation with oral therapy 4 weeks.  ESR good, though CRP remains elevated.  Will continue to monitor

## 2018-01-01 NOTE — Assessment & Plan Note (Signed)
No issues and will see patient in December to decide on removal or not.

## 2018-01-01 NOTE — Progress Notes (Signed)
   Subjective:    Patient ID: Tyrone Roman, male    DOB: 12/26/1945, 72 y.o.   MRN: [id number]  HPI Here for hsfu He has a history of recent L1-2 microdiscectomy for herniated disc with cauda equina syndrome and developed significant increase in back pain, lower extremity weakness and bacteremia with MSSA.  He underwent debridement and washout by Dr. Kathyrn Sheriff and placed on antibiotics.  He is on nafcilln for 6-8 weeks through at least December 20th (6weeks).  He has been walking more in his having more pain in his back but mainly exacerbated by increased activity.  He has gone 2 to 3 days without needing pain medications.  He has had no associated fever, chills, diarrhea or rashes.  He was feeling some abdominal or bladder fullness and was given a prescription for empiric Cipro by his primary care physician.   Review of Systems  Constitutional: Negative for chills, fatigue and fever.  Gastrointestinal: Negative for diarrhea and nausea.  Skin: Negative for rash.       Objective:   Physical Exam  Constitutional: He appears well-developed and well-nourished. No distress.  Eyes: No scleral icterus.  Cardiovascular: Normal rate, regular rhythm and normal heart sounds.  No murmur heard. Pulmonary/Chest: Effort normal and breath sounds normal. No respiratory distress.  Musculoskeletal:  Back incision is closed, no erythema, no warmth, no tenderness  Skin: No rash noted.   SH: no tobacco       Assessment & Plan:

## 2018-01-01 NOTE — Assessment & Plan Note (Signed)
Creat wnl, no leukopenia, thrombocytopenia

## 2018-01-06 ENCOUNTER — Ambulatory Visit: Payer: Self-pay

## 2018-01-06 ENCOUNTER — Telehealth: Payer: Self-pay | Admitting: Family Medicine

## 2018-01-06 DIAGNOSIS — R6520 Severe sepsis without septic shock: Secondary | ICD-10-CM | POA: Diagnosis not present

## 2018-01-06 DIAGNOSIS — G92 Toxic encephalopathy: Secondary | ICD-10-CM | POA: Diagnosis not present

## 2018-01-06 DIAGNOSIS — R7881 Bacteremia: Secondary | ICD-10-CM | POA: Diagnosis not present

## 2018-01-06 DIAGNOSIS — N179 Acute kidney failure, unspecified: Secondary | ICD-10-CM | POA: Diagnosis not present

## 2018-01-06 DIAGNOSIS — B9562 Methicillin resistant Staphylococcus aureus infection as the cause of diseases classified elsewhere: Secondary | ICD-10-CM | POA: Diagnosis not present

## 2018-01-06 DIAGNOSIS — T8141XA Infection following a procedure, superficial incisional surgical site, initial encounter: Secondary | ICD-10-CM | POA: Diagnosis not present

## 2018-01-06 NOTE — Telephone Encounter (Signed)
Copied from [place] [id number]. Topic: Quick Communication - Home Health Verbal Orders >> Jan 06, 2018 10:23 AM Cecelia Byars, NT wrote: Caller/Agency:  Claiborne Billings / [facility]  Callback Number:  [phone]  ok to leave a message  Requesting /PT Frequency:  1 week 4

## 2018-01-06 NOTE — Telephone Encounter (Signed)
Pt. Calling to request that Dr. Sarajane Jews "put me on something stronger, or something that will stop the pain." States it "is not breaking the pain." Has not called his Orthopedic  doctor yet. "I wanted to ask Dr. Sarajane Jews cause I'll get it quicker."  Currently taking Percocet 7.5 mg/325 mg 1 pill po every 6 hours for pain. Encouraged pt. To call his ortho and neurology doctor - refuses.Please advise pt.

## 2018-01-06 NOTE — Telephone Encounter (Signed)
I have called Tyrone Roman with [facility] to give the OK for the PT as requested.

## 2018-01-06 NOTE — Telephone Encounter (Signed)
Call returned to [place] [facility].  Reported she worked with pt. this morning, and noted pt's pulse rate to be 116, and regular.  Stated the pt. has a lot of back and hip pain.  Reported the following assess.: T. 98.7, P. 116, R. 18, BP 122/74 at rest, O2 Sat. 99 % on room air.  Reported minimal shortness of breath with walking 300 feet; recovered easily with rest.  Lungs clear.  PT reported the pt. has been laying alot, and was encouraged to be up walking, more.  Noted, that per the charting of his Physical Therapy sessions, his pulse has been running 90-116 over past 2 weeks.  Stated the pt's only complaint was back and hip pain.  Stated the pt. continues on IV antibiotics for infection. Advised will send this note to PCP, for his awareness. Noted that pt. has f/u appt. with ID on 01/22/18.  Advised PT to call office with any change in condition or concerning symptoms.  Agreed.          Reason for Disposition . Palpitations    Adv. [facility] Physical Therapist reported increased pulse rate of 110-120 while working with him today.  Reported pt. Had minimal shortness of breath with walking 300 feet.  Reported the pt. Is in a lot of pain with his back and hip; PT questioned if this may be contributing to his increased pulse rate.  Answer Assessment - Initial Assessment Questions 1. DESCRIPTION: "Please describe your heart rate or heart beat that you are having" (e.g., fast/slow, regular/irregular, skipped or extra beats, "palpitations")     Heart rate 110-116 during Physical Therapist's assessment over 45 min.  2. ONSET: "When did it start?" (Minutes, hours or days)      Has had pulse rate of 90-116 over past 2 weeks (per charting)  3. DURATION: "How long does it last" (e.g., seconds, minutes, hours)    Ongoing while Physical Therapist was present 4. PATTERN "Does it come and go, or has it been constant since it started?"  "Does it get worse with exertion?"   "Are you feeling it now?"     Noted to be present  over past 2 weeks per PT charting.   5. TAP: "Using your hand, can you tap out what you are feeling on a chair or table in front of you, so that I can hear?" (Note: not all patients can do this)       N/a  6. HEART RATE: "Can you tell me your heart rate?" "How many beats in 15 seconds?"  (Note: not all patients can do this)       116 regular 7. RECURRENT SYMPTOM: "Have you ever had this before?" If so, ask: "When was the last time?" and "What happened that time?"      Yes 8. CAUSE: "What do you think is causing the palpitations?"     Pt. C/o back and hip pain  9. CARDIAC HISTORY: "Do you have any history of heart disease?" (e.g., heart attack, angina, bypass surgery, angioplasty, arrhythmia)      Hx of CAD and HTN 10. OTHER SYMPTOMS: "Do you have any other symptoms?" (e.g., dizziness, chest pain, sweating, difficulty breathing)       Minimal shortness of breath during walk of 300 feet.  Complains of back and hip pain.  T. 98.7, Pulse 116, reg, R. 18, BP 122/74, O2 Sat. 99 % ; lungs clear, no edema  Protocols used: HEART RATE AND HEARTBEAT QUESTIONS-A-AH  Message from  Cecelia Byars, NT sent at 01/06/2018 10:53 AM EST   Claiborne Billings from [facility] called and said the patients resting heart rate was 116 during her visit , she is not with the patient currently ,at the time of the call, Please call her [phone]

## 2018-01-06 NOTE — Telephone Encounter (Signed)
Dr Fry please advise. thanks 

## 2018-01-08 DIAGNOSIS — G92 Toxic encephalopathy: Secondary | ICD-10-CM | POA: Diagnosis not present

## 2018-01-08 DIAGNOSIS — N179 Acute kidney failure, unspecified: Secondary | ICD-10-CM | POA: Diagnosis not present

## 2018-01-08 DIAGNOSIS — R6520 Severe sepsis without septic shock: Secondary | ICD-10-CM | POA: Diagnosis not present

## 2018-01-08 DIAGNOSIS — B9562 Methicillin resistant Staphylococcus aureus infection as the cause of diseases classified elsewhere: Secondary | ICD-10-CM | POA: Diagnosis not present

## 2018-01-08 DIAGNOSIS — T8141XA Infection following a procedure, superficial incisional surgical site, initial encounter: Secondary | ICD-10-CM | POA: Diagnosis not present

## 2018-01-08 DIAGNOSIS — R7881 Bacteremia: Secondary | ICD-10-CM | POA: Diagnosis not present

## 2018-01-08 NOTE — Telephone Encounter (Signed)
It seems the high pulse rate is related to the pain he is having. He will need an OV with me if he wants narcotic pain medication since Orthopedics has been managing this

## 2018-01-08 NOTE — Telephone Encounter (Signed)
See my other telephone note. He will need an OV with me to get narcotics

## 2018-01-08 NOTE — Telephone Encounter (Signed)
Spoke to pt and informed him of update. Pt verbalized understanding and stated he is seeing a back surgeon Monday for the issue.

## 2018-01-08 NOTE — Telephone Encounter (Signed)
Called and spoke with pt and he stated that he has an appt with the back surgeon on Monday and he will decide what needs to be done at that time.

## 2018-01-13 DIAGNOSIS — G92 Toxic encephalopathy: Secondary | ICD-10-CM | POA: Diagnosis not present

## 2018-01-13 DIAGNOSIS — R6520 Severe sepsis without septic shock: Secondary | ICD-10-CM | POA: Diagnosis not present

## 2018-01-13 DIAGNOSIS — N179 Acute kidney failure, unspecified: Secondary | ICD-10-CM | POA: Diagnosis not present

## 2018-01-13 DIAGNOSIS — T8141XA Infection following a procedure, superficial incisional surgical site, initial encounter: Secondary | ICD-10-CM | POA: Diagnosis not present

## 2018-01-13 DIAGNOSIS — R7881 Bacteremia: Secondary | ICD-10-CM | POA: Diagnosis not present

## 2018-01-13 DIAGNOSIS — B9562 Methicillin resistant Staphylococcus aureus infection as the cause of diseases classified elsewhere: Secondary | ICD-10-CM | POA: Diagnosis not present

## 2018-01-14 DIAGNOSIS — M5126 Other intervertebral disc displacement, lumbar region: Secondary | ICD-10-CM | POA: Diagnosis not present

## 2018-01-14 DIAGNOSIS — R7881 Bacteremia: Secondary | ICD-10-CM | POA: Diagnosis not present

## 2018-01-14 DIAGNOSIS — T8141XA Infection following a procedure, superficial incisional surgical site, initial encounter: Secondary | ICD-10-CM | POA: Diagnosis not present

## 2018-01-14 DIAGNOSIS — N179 Acute kidney failure, unspecified: Secondary | ICD-10-CM | POA: Diagnosis not present

## 2018-01-14 DIAGNOSIS — T8149XA Infection following a procedure, other surgical site, initial encounter: Secondary | ICD-10-CM | POA: Diagnosis not present

## 2018-01-14 DIAGNOSIS — G92 Toxic encephalopathy: Secondary | ICD-10-CM | POA: Diagnosis not present

## 2018-01-14 DIAGNOSIS — R6520 Severe sepsis without septic shock: Secondary | ICD-10-CM | POA: Diagnosis not present

## 2018-01-14 DIAGNOSIS — B9562 Methicillin resistant Staphylococcus aureus infection as the cause of diseases classified elsewhere: Secondary | ICD-10-CM | POA: Diagnosis not present

## 2018-01-14 DIAGNOSIS — M48061 Spinal stenosis, lumbar region without neurogenic claudication: Secondary | ICD-10-CM | POA: Diagnosis not present

## 2018-01-15 DIAGNOSIS — N179 Acute kidney failure, unspecified: Secondary | ICD-10-CM | POA: Diagnosis not present

## 2018-01-15 DIAGNOSIS — R7881 Bacteremia: Secondary | ICD-10-CM | POA: Diagnosis not present

## 2018-01-15 DIAGNOSIS — R6520 Severe sepsis without septic shock: Secondary | ICD-10-CM | POA: Diagnosis not present

## 2018-01-15 DIAGNOSIS — T8141XA Infection following a procedure, superficial incisional surgical site, initial encounter: Secondary | ICD-10-CM | POA: Diagnosis not present

## 2018-01-15 DIAGNOSIS — B9562 Methicillin resistant Staphylococcus aureus infection as the cause of diseases classified elsewhere: Secondary | ICD-10-CM | POA: Diagnosis not present

## 2018-01-15 DIAGNOSIS — G92 Toxic encephalopathy: Secondary | ICD-10-CM | POA: Diagnosis not present

## 2018-01-16 DIAGNOSIS — N179 Acute kidney failure, unspecified: Secondary | ICD-10-CM | POA: Diagnosis not present

## 2018-01-16 DIAGNOSIS — R6520 Severe sepsis without septic shock: Secondary | ICD-10-CM | POA: Diagnosis not present

## 2018-01-16 DIAGNOSIS — T8141XA Infection following a procedure, superficial incisional surgical site, initial encounter: Secondary | ICD-10-CM | POA: Diagnosis not present

## 2018-01-16 DIAGNOSIS — B9562 Methicillin resistant Staphylococcus aureus infection as the cause of diseases classified elsewhere: Secondary | ICD-10-CM | POA: Diagnosis not present

## 2018-01-16 DIAGNOSIS — R7881 Bacteremia: Secondary | ICD-10-CM | POA: Diagnosis not present

## 2018-01-16 DIAGNOSIS — G92 Toxic encephalopathy: Secondary | ICD-10-CM | POA: Diagnosis not present

## 2018-01-17 ENCOUNTER — Telehealth: Payer: Self-pay | Admitting: Family Medicine

## 2018-01-17 ENCOUNTER — Ambulatory Visit (INDEPENDENT_AMBULATORY_CARE_PROVIDER_SITE_OTHER): Payer: Medicare Other | Admitting: Family Medicine

## 2018-01-17 ENCOUNTER — Encounter: Payer: Self-pay | Admitting: Family Medicine

## 2018-01-17 VITALS — BP 110/80 | HR 120 | Temp 97.7°F | Wt 201.6 lb

## 2018-01-17 DIAGNOSIS — I25118 Atherosclerotic heart disease of native coronary artery with other forms of angina pectoris: Secondary | ICD-10-CM | POA: Diagnosis not present

## 2018-01-17 DIAGNOSIS — R1032 Left lower quadrant pain: Secondary | ICD-10-CM | POA: Diagnosis not present

## 2018-01-17 LAB — POCT URINALYSIS DIPSTICK
Bilirubin, UA: NEGATIVE
Blood, UA: NEGATIVE
Glucose, UA: NEGATIVE
Ketones, UA: NEGATIVE
Leukocytes, UA: NEGATIVE
Nitrite, UA: NEGATIVE
Protein, UA: NEGATIVE
Spec Grav, UA: 1.015 (ref 1.010–1.025)
Urobilinogen, UA: 0.2 E.U./dL
pH, UA: 6 (ref 5.0–8.0)

## 2018-01-17 MED ORDER — METRONIDAZOLE 500 MG PO TABS: 500.0000 mg | ORAL_TABLET | Freq: Three times a day (TID) | ORAL | 0 refills | Status: DC

## 2018-01-17 NOTE — Addendum Note (Signed)
Addended by: Elie Confer on: 01/17/2018 12:03 PM   Modules accepted: Orders

## 2018-01-17 NOTE — Progress Notes (Signed)
   Subjective:    Patient ID: Tyrone Roman, male    DOB: 09/10/1945, 72 y.o.   MRN: [id number]  HPI Here for recurrent lower abdominal pain. On 12-27-17 he phoned [place] that he was having urgency to urinate and burning for several days, but that he did not have transportation to our clinic. We called in 10 days of Cipro and he quickly felt better. Now for the past 3 days he has had pains in the left lower quadrant but he denies urinary burning. No nausea or fever. He continues on Nafcillin per Infectious Disease. He is chronically constipated and it has been several days since his last BM.    Review of Systems  Constitutional: Negative.   Respiratory: Negative.   Cardiovascular: Negative.   Gastrointestinal: Positive for abdominal pain and constipation. Negative for abdominal distention, anal bleeding, blood in stool, diarrhea, nausea, rectal pain and vomiting.  Genitourinary: Positive for frequency. Negative for dysuria, flank pain and hematuria.       Objective:   Physical Exam Constitutional:      Comments: In pain. Walks with a walker.   Neck:     Musculoskeletal: Neck supple.  Cardiovascular:     Rate and Rhythm: Normal rate and regular rhythm.     Pulses: Normal pulses.     Heart sounds: Normal heart sounds.  Pulmonary:     Effort: Pulmonary effort is normal.     Breath sounds: Normal breath sounds.  Abdominal:     General: Abdomen is flat. Bowel sounds are normal. There is no distension.     Palpations: Abdomen is soft. There is no mass.     Tenderness: There is no guarding or rebound.     Hernia: No hernia is present.     Comments: Moderately tender in the LLQ   Lymphadenopathy:     Cervical: No cervical adenopathy.           Assessment & Plan:  He likely has diverticulitis. Treat with Cipro and Flagyl for 10 days. Culture the urine.  [name], MD

## 2018-01-17 NOTE — Telephone Encounter (Signed)
Copied from [place] [id number] [id number]. Topic: Quick Communication - Home Health Verbal Orders >> Jan 17, 2018  3:07 PM Bea Graff, [place] wrote: Caller/Agency: [facility] Number: [phone] Requesting OT/PT/Skilled Nursing/Social Work: PT Frequency: For general tissue massage to lower lumbar spine for pain and edema control.

## 2018-01-17 NOTE — Telephone Encounter (Signed)
Called and lmom for kelly from [facility] and left the VO on the secure VM.

## 2018-01-19 LAB — URINE CULTURE
MICRO NUMBER:: 91495551
Result:: NO GROWTH
SPECIMEN QUALITY:: ADEQUATE

## 2018-01-20 ENCOUNTER — Telehealth: Payer: Self-pay | Admitting: Family Medicine

## 2018-01-20 ENCOUNTER — Telehealth: Payer: Self-pay | Admitting: *Deleted

## 2018-01-20 DIAGNOSIS — B9562 Methicillin resistant Staphylococcus aureus infection as the cause of diseases classified elsewhere: Secondary | ICD-10-CM | POA: Diagnosis not present

## 2018-01-20 DIAGNOSIS — N179 Acute kidney failure, unspecified: Secondary | ICD-10-CM | POA: Diagnosis not present

## 2018-01-20 DIAGNOSIS — R7881 Bacteremia: Secondary | ICD-10-CM | POA: Diagnosis not present

## 2018-01-20 DIAGNOSIS — R6520 Severe sepsis without septic shock: Secondary | ICD-10-CM | POA: Diagnosis not present

## 2018-01-20 DIAGNOSIS — T8141XA Infection following a procedure, superficial incisional surgical site, initial encounter: Secondary | ICD-10-CM | POA: Diagnosis not present

## 2018-01-20 DIAGNOSIS — G92 Toxic encephalopathy: Secondary | ICD-10-CM | POA: Diagnosis not present

## 2018-01-20 NOTE — Telephone Encounter (Signed)
Dr. Fry please advise. Thanks  

## 2018-01-20 NOTE — Telephone Encounter (Signed)
Duplicate message. 

## 2018-01-20 NOTE — Telephone Encounter (Signed)
Copied from [place] [id number]. Topic: General - Other >> Jan 20, 2018 10:09 AM Percell Belt A wrote: Reason for CRM:  Pt called in and stated he was in on Friday to see Dr Friday and he stated Provider was suppose to call in ciprofloxacin (CIPRO) 500 MG tablet [[id number]] .  It looks like the last time it was called in was 12/27/2017 Pharmacy - [facility] on [address]    >> Jan 20, 2018 11:07 AM Virl Cagey, CMA wrote: Assessment & Plan:  He likely has diverticulitis. Treat with Cipro and Flagyl for 10 days. Culture the urine.  [name], MD   Abx was never sent in to pharmacy.

## 2018-01-20 NOTE — Telephone Encounter (Signed)
Copied from [address] [address] [id number]. Topic: General - Other >> Jan 20, 2018 10:09 AM Percell Belt A wrote: Reason for CRM:  Pt called in and stated he was in on Friday to see Dr Friday and he stated Provider was suppose to call in ciprofloxacin (CIPRO) 500 MG tablet [[id number]] .  It looks like the last time it was called in was 12/27/2017 Pharmacy - [facility] on [address]

## 2018-01-21 DIAGNOSIS — N179 Acute kidney failure, unspecified: Secondary | ICD-10-CM | POA: Diagnosis not present

## 2018-01-21 DIAGNOSIS — R6520 Severe sepsis without septic shock: Secondary | ICD-10-CM | POA: Diagnosis not present

## 2018-01-21 DIAGNOSIS — T8141XA Infection following a procedure, superficial incisional surgical site, initial encounter: Secondary | ICD-10-CM | POA: Diagnosis not present

## 2018-01-21 DIAGNOSIS — B9562 Methicillin resistant Staphylococcus aureus infection as the cause of diseases classified elsewhere: Secondary | ICD-10-CM | POA: Diagnosis not present

## 2018-01-21 DIAGNOSIS — R7881 Bacteremia: Secondary | ICD-10-CM | POA: Diagnosis not present

## 2018-01-21 DIAGNOSIS — G92 Toxic encephalopathy: Secondary | ICD-10-CM | POA: Diagnosis not present

## 2018-01-21 MED ORDER — CIPROFLOXACIN HCL 500 MG PO TABS: 500.0000 mg | ORAL_TABLET | Freq: Two times a day (BID) | ORAL | 0 refills | Status: DC

## 2018-01-21 NOTE — Telephone Encounter (Signed)
His culture was negative, so that supports our diagnosis of diverticulitis. I sent in more Cipro to take with the Flagyl

## 2018-01-21 NOTE — Telephone Encounter (Signed)
Called and spoke with pt and he is aware of results and that the rx for meds has been sent to the pharmacy.

## 2018-01-22 ENCOUNTER — Encounter: Payer: Self-pay | Admitting: Internal Medicine

## 2018-01-22 ENCOUNTER — Ambulatory Visit: Payer: Medicare Other | Admitting: Internal Medicine

## 2018-01-22 ENCOUNTER — Telehealth: Payer: Self-pay

## 2018-01-22 ENCOUNTER — Ambulatory Visit (INDEPENDENT_AMBULATORY_CARE_PROVIDER_SITE_OTHER): Payer: Medicare Other | Admitting: Internal Medicine

## 2018-01-22 DIAGNOSIS — Z8619 Personal history of other infectious and parasitic diseases: Secondary | ICD-10-CM | POA: Diagnosis not present

## 2018-01-22 DIAGNOSIS — T8149XA Infection following a procedure, other surgical site, initial encounter: Secondary | ICD-10-CM

## 2018-01-22 DIAGNOSIS — Z95828 Presence of other vascular implants and grafts: Secondary | ICD-10-CM | POA: Diagnosis not present

## 2018-01-22 DIAGNOSIS — Z87891 Personal history of nicotine dependence: Secondary | ICD-10-CM

## 2018-01-22 DIAGNOSIS — Z5181 Encounter for therapeutic drug level monitoring: Secondary | ICD-10-CM

## 2018-01-22 DIAGNOSIS — T8149XD Infection following a procedure, other surgical site, subsequent encounter: Secondary | ICD-10-CM | POA: Diagnosis not present

## 2018-01-22 DIAGNOSIS — R7881 Bacteremia: Secondary | ICD-10-CM

## 2018-01-22 DIAGNOSIS — Z452 Encounter for adjustment and management of vascular access device: Secondary | ICD-10-CM

## 2018-01-22 DIAGNOSIS — B9561 Methicillin susceptible Staphylococcus aureus infection as the cause of diseases classified elsewhere: Secondary | ICD-10-CM

## 2018-01-22 DIAGNOSIS — Z7982 Long term (current) use of aspirin: Secondary | ICD-10-CM | POA: Diagnosis not present

## 2018-01-22 MED ORDER — AMOXICILLIN-POT CLAVULANATE 875-125 MG PO TABS: 1.0000 | ORAL_TABLET | Freq: Two times a day (BID) | ORAL | 0 refills | Status: DC

## 2018-01-22 NOTE — Assessment & Plan Note (Signed)
Creat has been stable, most recent 1.39.

## 2018-01-22 NOTE — Progress Notes (Signed)
   Subjective:    Patient ID: Tyrone Roman, male    DOB: Oct 16, 1945, 72 y.o.   MRN: [id number]  HPI Here for hsfu He has a history of recent L1-2 microdiscectomy for herniated disc with cauda equina syndrome and developed significant increase in back pain, lower extremity weakness and bacteremia with MSSA.  He underwent debridement and washout by Dr. Kathyrn Sheriff and placed on antibiotics.  He is on nafcilln for 6-8 weeks through at least December 20th (6weeks).  He is now having more back pain.  He did see the PA with Dr. Kathyrn Sheriff and did a repeat MRI, report not available to me. No associated rash or diarrhea.  Thought to have diverticulitis and on cipro + flagyl.  Previously treated for UTI with a long course of cipro last month.    Review of Systems  Constitutional: Negative for chills, fatigue and fever.  Gastrointestinal: Negative for diarrhea and nausea.  Skin: Negative for rash.       Objective:   Physical Exam Constitutional:      General: He is not in acute distress.    Appearance: He is well-developed.  Eyes:     General: No scleral icterus. Cardiovascular:     Rate and Rhythm: Normal rate and regular rhythm.     Heart sounds: Normal heart sounds. No murmur.  Pulmonary:     Effort: Pulmonary effort is normal. No respiratory distress.     Breath sounds: Normal breath sounds.  Skin:    Findings: No rash.    SH: no tobacco       Assessment & Plan:

## 2018-01-22 NOTE — Assessment & Plan Note (Addendum)
Improved overall.  I will get a copy of the recent MRI report. I will stop his nafcillin in 2 days and remove his picc.  I will have him take Augmentin for 4 weeks and follow up in 3 weeks.  With his significant pain, I did suggest addition of ibuprofen some to help decrease inflammation.  Creat normal or near normal.  Will monitor.

## 2018-01-22 NOTE — Assessment & Plan Note (Signed)
Working well, no issues.  Will have it removed Friday

## 2018-01-22 NOTE — Assessment & Plan Note (Signed)
resolved 

## 2018-01-22 NOTE — Telephone Encounter (Signed)
Per Dr. Linus Salmons faxed release of information to [facility]. Will need office to fax MRI that was done on 01/13/18 by Dr. Kathyrn Sheriff. Release was signed by patient and faxed to [phone]. Medical records will take up to 7-10 business days before being faxed. [facility]: [phone] F: [place], [place]

## 2018-01-22 NOTE — Telephone Encounter (Signed)
Per Dr. Linus Salmons called [name] with orders to pull patient's picc line after last dose on 12/20. Spoke with Jackelyn Poling, Rn who was able to take verbal orders. Will inform Home Health nurse that patient's picc line must be removed after last dose. Tyrone Roman

## 2018-01-24 DIAGNOSIS — T8141XA Infection following a procedure, superficial incisional surgical site, initial encounter: Secondary | ICD-10-CM | POA: Diagnosis not present

## 2018-01-24 DIAGNOSIS — G92 Toxic encephalopathy: Secondary | ICD-10-CM | POA: Diagnosis not present

## 2018-01-24 DIAGNOSIS — B9562 Methicillin resistant Staphylococcus aureus infection as the cause of diseases classified elsewhere: Secondary | ICD-10-CM | POA: Diagnosis not present

## 2018-01-24 DIAGNOSIS — R6520 Severe sepsis without septic shock: Secondary | ICD-10-CM | POA: Diagnosis not present

## 2018-01-24 DIAGNOSIS — N179 Acute kidney failure, unspecified: Secondary | ICD-10-CM | POA: Diagnosis not present

## 2018-01-24 DIAGNOSIS — R7881 Bacteremia: Secondary | ICD-10-CM | POA: Diagnosis not present

## 2018-01-25 ENCOUNTER — Telehealth: Payer: Self-pay | Admitting: Internal Medicine

## 2018-01-25 NOTE — Telephone Encounter (Signed)
I received a phone call today from Tyrone Roman's caregiver.  Tyrone Roman will recently completed a course of IV nafcillin for MSSA bacteremia and wound infection and was transitioned to oral amoxicillin clavulanate yesterday.  His PICC was removed.  He is also recently been treated by his PCP with ciprofloxacin and metronidazole for possible urinary tract infection and diverticulitis.  His caregiver says that he became somewhat confused overnight and she became concerned that he was septic.  He was also incontinent of urine.  He has complained of dysuria and had one episode of diarrhea today.  Is not developed any rash.  I told her that I could not tell her what has caused his confusion.  He is not having any fever and I doubt that he is septic.  He was able to talk to me on the phone.  I suggested that she follow-up her Sarajane Jews, his PCP who had done a urine culture last week.  If the diarrhea persist he will need to be checked for C. difficile.

## 2018-01-27 ENCOUNTER — Telehealth: Payer: Self-pay | Admitting: *Deleted

## 2018-01-27 DIAGNOSIS — R6520 Severe sepsis without septic shock: Secondary | ICD-10-CM | POA: Diagnosis not present

## 2018-01-27 DIAGNOSIS — G92 Toxic encephalopathy: Secondary | ICD-10-CM | POA: Diagnosis not present

## 2018-01-27 DIAGNOSIS — T8141XA Infection following a procedure, superficial incisional surgical site, initial encounter: Secondary | ICD-10-CM | POA: Diagnosis not present

## 2018-01-27 DIAGNOSIS — B9562 Methicillin resistant Staphylococcus aureus infection as the cause of diseases classified elsewhere: Secondary | ICD-10-CM | POA: Diagnosis not present

## 2018-01-27 DIAGNOSIS — R7881 Bacteremia: Secondary | ICD-10-CM | POA: Diagnosis not present

## 2018-01-27 DIAGNOSIS — N179 Acute kidney failure, unspecified: Secondary | ICD-10-CM | POA: Diagnosis not present

## 2018-01-27 NOTE — Telephone Encounter (Signed)
Tyrone Roman called to ask if the patient could have a TENS unit to help with the pain. She advised his is in a lot of pain and wants to use it to help and make sure he is clear of infection. Advised will ask the provider and give her a call back.  Per verbal from [place] will advise her the infection is clear and he does not see a problem with a the unit.  Called Tyrone Roman back and left a message.

## 2018-02-18 ENCOUNTER — Encounter: Payer: Self-pay | Admitting: Internal Medicine

## 2018-02-18 ENCOUNTER — Ambulatory Visit (INDEPENDENT_AMBULATORY_CARE_PROVIDER_SITE_OTHER): Payer: Medicare Other | Admitting: Internal Medicine

## 2018-02-18 VITALS — BP 132/88 | HR 81 | Temp 97.6°F | Wt 192.0 lb

## 2018-02-18 DIAGNOSIS — T8149XD Infection following a procedure, other surgical site, subsequent encounter: Secondary | ICD-10-CM

## 2018-02-18 DIAGNOSIS — Z7982 Long term (current) use of aspirin: Secondary | ICD-10-CM

## 2018-02-18 DIAGNOSIS — Z87891 Personal history of nicotine dependence: Secondary | ICD-10-CM

## 2018-02-18 DIAGNOSIS — T8149XA Infection following a procedure, other surgical site, initial encounter: Secondary | ICD-10-CM

## 2018-02-18 DIAGNOSIS — Z5181 Encounter for therapeutic drug level monitoring: Secondary | ICD-10-CM

## 2018-02-18 DIAGNOSIS — N179 Acute kidney failure, unspecified: Secondary | ICD-10-CM

## 2018-02-18 LAB — COMPLETE METABOLIC PANEL WITH GFR
AG Ratio: 1.3 (calc) (ref 1.0–2.5)
ALT: 22 U/L (ref 9–46)
AST: 34 U/L (ref 10–35)
Albumin: 3.7 g/dL (ref 3.6–5.1)
Alkaline phosphatase (APISO): 74 U/L (ref 40–115)
BUN/Creatinine Ratio: 12 (calc) (ref 6–22)
BUN: 20 mg/dL (ref 7–25)
CO2: 21 mmol/L (ref 20–32)
Calcium: 10.6 mg/dL — ABNORMAL HIGH (ref 8.6–10.3)
Chloride: 106 mmol/L (ref 98–110)
Creat: 1.64 mg/dL — ABNORMAL HIGH (ref 0.70–1.18)
GFR, Est African American: 48 mL/min/{1.73_m2} — ABNORMAL LOW (ref >=60)
GFR, Est Non African American: 41 mL/min/{1.73_m2} — ABNORMAL LOW (ref >=60)
Globulin: 2.8 g/dL (calc) (ref 1.9–3.7)
Glucose, Bld: 105 mg/dL — ABNORMAL HIGH (ref 65–99)
Potassium: 4.5 mmol/L (ref 3.5–5.3)
Sodium: 141 mmol/L (ref 135–146)
Total Bilirubin: 0.6 mg/dL (ref 0.2–1.2)
Total Protein: 6.5 g/dL (ref 6.1–8.1)

## 2018-02-18 LAB — SEDIMENTATION RATE: Sed Rate: 75 mm/h — ABNORMAL HIGH (ref 0–20)

## 2018-02-18 NOTE — Progress Notes (Signed)
   Subjective:    Patient ID: Tyrone Roman, male    DOB: [birth year], 73 y.o.   MRN: [id number]  HPI Here for hsfu He has a history of recent L1-2 microdiscectomy for herniated disc with cauda equina syndrome and developed significant increase in back pain, lower extremity weakness and bacteremia with MSSA.  He underwent debridement and washout by Dr. Kathyrn Sheriff and placed on antibiotics.  He completed 6 weeks of nafcillin and then put on Augmentin for 4 weeks.  He is walking now without a walker, no associated rash or diarrhea.  Still with pain but managed with ibuprofen mainly and occasional percocet.    Review of Systems  Constitutional: Negative for chills, fatigue and fever.  Gastrointestinal: Negative for diarrhea and nausea.  Skin: Negative for rash.       Objective:   Physical Exam Constitutional:      General: He is not in acute distress.    Appearance: He is well-developed.  Eyes:     General: No scleral icterus. Cardiovascular:     Rate and Rhythm: Normal rate and regular rhythm.     Heart sounds: Normal heart sounds. No murmur.  Pulmonary:     Effort: Pulmonary effort is normal. No respiratory distress.     Breath sounds: Normal breath sounds.  Skin:    Findings: No rash.    SH: no tobacco       Assessment & Plan:

## 2018-02-18 NOTE — Assessment & Plan Note (Addendum)
Will check creat to be sure it has remained wnl ADDENDUM: creat up some, will notify him to reduce NSAID use and monitor.

## 2018-02-18 NOTE — Assessment & Plan Note (Addendum)
Hopefully now completely clear.  clinicallly seems very well so will check ESR and stop unless significant concerns.  Can follow up as needed  ADDENDUM: ESR noted and will monitor clinically off of antibiotics.  No indication for prolonged treatment.

## 2018-02-18 NOTE — Assessment & Plan Note (Signed)
Will check cmp on medication and with ibuprofen use

## 2018-02-21 ENCOUNTER — Telehealth: Payer: Self-pay

## 2018-02-21 NOTE — Telephone Encounter (Signed)
-----   Message from Thayer Headings, MD sent at 02/19/2018  1:50 PM EST ----- Could you let him know his creat was a bit up, probably from NSAID use and should back off some of the ibuprofen and have it monitored by his PCP in the near future.  Other wise his labs looked fine and he can stop the antibiotic as discussed when he runs out.  thanks

## 2018-02-21 NOTE — Telephone Encounter (Signed)
Patient notified and aware to decrease ibuprofen use and have PCP monitor creat in the future and advised that he could stop his antibiotics once he runs out per Dr. Linus Salmons. S.Eliabeth Shoff, LPN

## 2018-03-14 DIAGNOSIS — L409 Psoriasis, unspecified: Secondary | ICD-10-CM | POA: Diagnosis not present

## 2018-03-14 DIAGNOSIS — M7660 Achilles tendinitis, unspecified leg: Secondary | ICD-10-CM | POA: Diagnosis not present

## 2018-03-14 DIAGNOSIS — M0579 Rheumatoid arthritis with rheumatoid factor of multiple sites without organ or systems involvement: Secondary | ICD-10-CM | POA: Diagnosis not present

## 2018-03-14 DIAGNOSIS — E663 Overweight: Secondary | ICD-10-CM | POA: Diagnosis not present

## 2018-03-14 DIAGNOSIS — R21 Rash and other nonspecific skin eruption: Secondary | ICD-10-CM | POA: Diagnosis not present

## 2018-03-14 DIAGNOSIS — M150 Primary generalized (osteo)arthritis: Secondary | ICD-10-CM | POA: Diagnosis not present

## 2018-03-14 DIAGNOSIS — M2550 Pain in unspecified joint: Secondary | ICD-10-CM | POA: Diagnosis not present

## 2018-03-14 DIAGNOSIS — Z6824 Body mass index (BMI) 24.0-24.9, adult: Secondary | ICD-10-CM | POA: Diagnosis not present

## 2018-03-24 DIAGNOSIS — Z6825 Body mass index (BMI) 25.0-25.9, adult: Secondary | ICD-10-CM | POA: Diagnosis not present

## 2018-03-24 DIAGNOSIS — M5126 Other intervertebral disc displacement, lumbar region: Secondary | ICD-10-CM | POA: Diagnosis not present

## 2018-03-24 DIAGNOSIS — I10 Essential (primary) hypertension: Secondary | ICD-10-CM | POA: Diagnosis not present

## 2018-03-24 DIAGNOSIS — T8149XA Infection following a procedure, other surgical site, initial encounter: Secondary | ICD-10-CM | POA: Diagnosis not present

## 2018-03-27 ENCOUNTER — Encounter: Payer: Self-pay | Admitting: Gastroenterology

## 2018-04-02 DIAGNOSIS — T8149XA Infection following a procedure, other surgical site, initial encounter: Secondary | ICD-10-CM | POA: Diagnosis not present

## 2018-04-03 DIAGNOSIS — M47816 Spondylosis without myelopathy or radiculopathy, lumbar region: Secondary | ICD-10-CM | POA: Diagnosis not present

## 2018-04-03 DIAGNOSIS — T8149XA Infection following a procedure, other surgical site, initial encounter: Secondary | ICD-10-CM | POA: Diagnosis not present

## 2018-04-03 DIAGNOSIS — M4802 Spinal stenosis, cervical region: Secondary | ICD-10-CM | POA: Diagnosis not present

## 2018-04-03 DIAGNOSIS — M5127 Other intervertebral disc displacement, lumbosacral region: Secondary | ICD-10-CM | POA: Diagnosis not present

## 2018-04-03 DIAGNOSIS — M5126 Other intervertebral disc displacement, lumbar region: Secondary | ICD-10-CM | POA: Diagnosis not present

## 2018-04-09 ENCOUNTER — Other Ambulatory Visit: Payer: Self-pay | Admitting: Family Medicine

## 2018-04-09 DIAGNOSIS — M4316 Spondylolisthesis, lumbar region: Secondary | ICD-10-CM | POA: Diagnosis not present

## 2018-04-09 DIAGNOSIS — T8149XA Infection following a procedure, other surgical site, initial encounter: Secondary | ICD-10-CM | POA: Diagnosis not present

## 2018-04-09 DIAGNOSIS — I10 Essential (primary) hypertension: Secondary | ICD-10-CM | POA: Diagnosis not present

## 2018-04-09 DIAGNOSIS — Z6825 Body mass index (BMI) 25.0-25.9, adult: Secondary | ICD-10-CM | POA: Diagnosis not present

## 2018-04-11 NOTE — Progress Notes (Addendum)
Subjective:   Tyrone Roman is a 73 y.o. male who presents for an Initial Medicare Annual Wellness Visit.  Review of Systems  No ROS.  Medicare Wellness Visit. Additional risk factors are reflected in the social history.  Cardiac Risk Factors include: advanced age (>48men, >97 women);male gender;dyslipidemia;sedentary lifestyle Sleep patterns: has restless sleep and gets up 1 times nightly to void. Pt. Attributes restless sleep to back pain. Pt. Takes tramadol sparingly however, and zolpidem for sleep sparingly as well.    Home Safety/Smoke Alarms: Feels safe in home. Smoke alarms in place.  Living environment; residence and Adult nurse: lives alone. Has cane and walker if needed for stability, but has not had to use it in past couple of months pt. reports.. Seat Belt Safety/Bike Helmet: Wears seat belt.    Male:   CCS-  12/2014. Pt. Is aware that it is due, but does not want to schedule at this time, as he wants to have back surgery first.    PSA-  Lab Results  Component Value Date   PSA 1.40 12/05/2016   PSA 1.41 11/15/2011   PSA 1.82 01/26/2010      Objective:    Today's Vitals   04/14/18 0822  BP: 122/84  Pulse: 87  Resp: 18  Temp: 97.8 F (36.6 C)  TempSrc: Oral  SpO2: 97%  Weight: 204 lb (92.5 kg)  Height: 6\' 2"  (1.88 m)  PainSc: 3   PainLoc: Back   Body mass index is 26.19 kg/m.  Advanced Directives 04/14/2018 12/15/2017 12/12/2017 12/10/2017 12/06/2017 10/08/2017 01/19/2016  Does Patient Have a Medical Advance Directive? (No Data) - - No Yes Yes Yes  Type of Advance Directive - - - - Living will [place];Living will [place];Living will  Does patient want to make changes to medical advance directive? - - Yes (Inpatient - patient defers changing a medical advance directive at this time) - No - Patient declined No - Patient declined -  Copy of [place] in Chart? - - - - - - -  Would patient like  information on creating a medical advance directive? - No - Patient declined - - - - -  Pre-existing out of facility DNR order (yellow form or pink MOST form) - - - - - - -    Current Medications (verified) Outpatient Encounter Medications as of 04/14/2018  Medication Sig  . acetaminophen (TYLENOL) 325 MG tablet Take 2 tablets (650 mg total) by mouth every 6 (six) hours as needed for mild pain, fever or headache.  . metoprolol succinate (TOPROL-XL) 25 MG 24 hr tablet TAKE 1 TABLET BY MOUTH DAILY  . nitroGLYCERIN (NITROSTAT) 0.4 MG SL tablet Place 1 tablet (0.4 mg total) under the tongue every 5 (five) minutes as needed for chest pain (3 doses max).  Marland Kitchen omeprazole (PRILOSEC) 40 MG capsule Take 40 mg by mouth daily.  . potassium chloride SA (KLOR-CON M20) 20 MEQ tablet Take 1 tablet (20 mEq total) by mouth daily.  . sildenafil (REVATIO) 20 MG tablet Take 20 mg by mouth daily as needed (Patient taking differently: Take 20 mg by mouth daily as needed (erectile disfunction). )  . simvastatin (ZOCOR) 40 MG tablet TAKE ONE TABLET BY MOUTH DAILY  . tamsulosin (FLOMAX) 0.4 MG CAPS capsule TAKE ONE CAPSULE BY MOUTH DAILY  . traMADol (ULTRAM) 50 MG tablet Take 1-2 tablets (50-100 mg total) by mouth 3 (three) times daily as needed.  . zolpidem (AMBIEN)  10 MG tablet TAKE ONE TABLET BY MOUTH EVERY NIGHT AT BEDTIME (Patient taking differently: Take 10 mg by mouth at bedtime. )  . aspirin EC 81 MG tablet Take 1 tablet (81 mg total) by mouth daily.  . [DISCONTINUED] ciprofloxacin (CIPRO) 500 MG tablet Take 1 tablet (500 mg total) by mouth 2 (two) times daily. (Patient not taking: Reported on 04/14/2018)  . [DISCONTINUED] cyclobenzaprine (FLEXERIL) 5 MG tablet Take 1-2 tablets (5-10 mg total) by mouth 3 (three) times daily as needed for muscle spasms. (Patient not taking: Reported on 04/14/2018)  . [DISCONTINUED] isosorbide mononitrate (IMDUR) 30 MG 24 hr tablet Take 1 tablet (30 mg total) by mouth daily.  .  [DISCONTINUED] methocarbamol (ROBAXIN) 500 MG tablet Take 500 mg by mouth every 6 (six) hours as needed for muscle spasms.  . [DISCONTINUED] metroNIDAZOLE (FLAGYL) 500 MG tablet Take 1 tablet (500 mg total) by mouth 3 (three) times daily. (Patient not taking: Reported on 04/14/2018)   No facility-administered encounter medications on file as of 04/14/2018.     Allergies (verified) Cephalexin; Clarithromycin; Certolizumab pegol; Doxycycline; Hydroxyzine; Lidoderm; and Pyrithione zinc   History: Past Medical History:  Diagnosis Date  . Barrett's esophageal ulceration   . BPH (benign prostatic hyperplasia)   . CAD (coronary artery disease)    sees Dr. Mar Daring  . Cataract    both eyes  . Colonic polyp   . Concussion with loss of consciousness of 30 minutes or less   . Contact dermatitis   . Contact with or exposure to venereal diseases   . COPD (chronic obstructive pulmonary disease) ([place])    sees Dr. Kara Mead   . Diverticulosis of colon   . Dysphagia   . GERD (gastroesophageal reflux disease)   . HNP (herniated nucleus pulposus), lumbar    recurrent  . Hyperlipidemia   . Hypertension   . Laceration of scalp   . Myocardial infarction [facility])    2011- stent placed  . Nephrolithiasis   . Pre-operative cardiovascular examination   . Rheumatoid arthritis [facility])    sees Dr. Gavin Pound   . SOB (shortness of breath)    Past Surgical History:  Procedure Laterality Date  . BACK SURGERY    . CARDIAC CATHETERIZATION N/A 01/19/2016   Procedure: Left Heart Cath and Coronary Angiography;  Surgeon: Burnell Blanks, MD;  Location: [place] [facility];  Service: Cardiovascular;  Laterality: N/A;  . COLONOSCOPY  12/09/2014   per Dr. Silverio Decamp, adenomatous polyps, repeat 3 years   . CORONARY ANGIOPLASTY WITH STENT PLACEMENT    . ESOPHAGOGASTRODUODENOSCOPY (EGD) WITH ESOPHAGEAL DILATION  02/17/09   Barretts esophagus   . KNEE ARTHROSCOPY Left X 2  . LUMBAR LAMINECTOMY/DECOMPRESSION  MICRODISCECTOMY Right 10/07/2017   Procedure: RIGHT LUMBAR ONE-TWO LAMINECTOMY WITH MICRODISCECTOMY;  Surgeon: Consuella Lose, MD;  Location: [facility];  Service: Neurosurgery;  Laterality: Right;  . LUMBAR LAMINECTOMY/DECOMPRESSION MICRODISCECTOMY N/A 12/06/2017   Procedure: RECURRENT MICRODISCECTOMY LUMBAR ONE - LUMBAR TWO;  Surgeon: Consuella Lose, MD;  Location: [place];  Service: Neurosurgery;  Laterality: N/A;  . LUMBAR WOUND DEBRIDEMENT N/A 12/11/2017   Procedure: LUMBAR WOUND DEBRIDEMENT;  Surgeon: Consuella Lose, MD;  Location: [place];  Service: Neurosurgery;  Laterality: N/A;  . TONSILLECTOMY     Family History  Problem Relation Age of Onset  . Heart attack Father        cardiovascular disorder  . Arthritis Father        family hx  . Colon cancer Father  mets  . Prostate cancer Father        1st degree relative  . Arthritis Mother   . Diabetes Paternal Uncle   . Colon cancer Paternal Uncle   . Cancer Maternal Grandmother   . Skin cancer Daughter   . Esophageal cancer Neg Hx   . Stomach cancer Neg Hx   . Rectal cancer Neg Hx    Social History   Socioeconomic History  . Marital status: Divorced    Spouse name: Not on file  . Number of children: 4  . Years of education: Not on file  . Highest education level: Not on file  Occupational History  . Occupation: ENGINEER    Employer: [employer]  Social Needs  . Financial resource strain: Not hard at all  . Food insecurity:    Worry: Never true    Inability: Never true  . Transportation needs:    Medical: No    Non-medical: No  Tobacco Use  . Smoking status: Former Smoker    Packs/day: 1.00    Years: 20.00    Pack years: 20.00    Types: Cigarettes    Last attempt to quit: 10/12/1988    Years since quitting: 29.5  . Smokeless tobacco: Never Used  . Tobacco comment: 1960's   Substance and Sexual Activity  . Alcohol use: Yes    Alcohol/week: 0.0 standard drinks    Comment: rare  . Drug use: No  .  Sexual activity: Not on file  Lifestyle  . Physical activity:    Days per week: Not on file    Minutes per session: Not on file  . Stress: To some extent  Relationships  . Social connections:    Talks on phone: Once a week    Gets together: Not on file    Attends religious service: Not on file    Active member of club or organization: No    Attends meetings of clubs or organizations: Never    Relationship status: Not on file  Other Topics Concern  . 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       04/14/2018:   Lives alone, has girlfriend who visits, main source of emotional support   Has been struggling with recent hospitalizations/ill health following sepsis and back surgeries   Tobacco Counseling Counseling given: Not Answered Comment: 1960's    Activities of Daily Living In your present state of health, do you have any difficulty performing the following activities: 04/14/2018 12/12/2017  Hearing? N -  Vision? Y -  Difficulty concentrating or making decisions? N -  Walking or climbing stairs? N -  Dressing or bathing? N -  Doing errands, shopping? N Y  Comment - "not driving right nowChief Executive Officer and eating ? N -  Using the Toilet? N -  In the past six months, have you accidently leaked urine? N -  Do you have problems with loss of bowel control? N -  Managing your Medications? N -  Managing your Finances? N -  Housekeeping or managing your Housekeeping? N -  Some recent data might be hidden     Immunizations and Health Maintenance Immunization History  Administered Date(s) Administered  . Influenza Split 02/05/2012, 01/26/2013  . Influenza Whole 12/07/2002, 01/11/2009, 10/21/2009  . Influenza, High Dose Seasonal PF 10/04/2015, 12/05/2016  . Influenza,inj,Quad PF,6+ Mos 12/27/2014  . Pneumococcal Conjugate-13 10/04/2015  .  Pneumococcal Polysaccharide-23 02/05/2001,  01/11/2009  . Td 02/05/2006   Health Maintenance Due  Topic Date Due  . Hepatitis C Screening  December 02, 1945    Patient Care Team: [name], MD as PCP - General  Indicate any recent Medical Services you may have received from other than [facility] providers in the past year (date may be approximate).    Assessment:   This is a routine wellness examination for Tyrone Roman. Physical assessment deferred to PCP.   Hearing/Vision screen Hearing Screening Comments: Has had ringing in ears X 3 weeks, but normally does not have hearing concerns. Appointment set up today with PCP.  Vision Screening Comments: Has been told he has cataracts, needs new opthomalogist. List of ophthamologists provided.   Dietary issues and exercise activities discussed: Current Exercise Habits: The patient does not participate in regular exercise at present, Exercise limited by: cardiac condition(s);psychological condition(s);neurologic condition(s);orthopedic condition(s) Diet (meal preparation, eat out, water intake, caffeinated beverages, dairy products, fruits and vegetables): in general, a "healthy" diet  . Pt's appetite has improved since hospitalization, has been gaining weight back. High protein diet and good hydration encouraged in wake of sepsis and pending back surgery.   Goals    . Patient Stated     Remain free from infection! Schedule back surgery for spring      Depression Screen PHQ 2/9 Scores 04/14/2018 03/11/2017 10/04/2015 06/16/2014  PHQ - 2 Score 2 0 0 0  PHQ- 9 Score 4 - - -    Fall Risk Fall Risk  04/14/2018 03/11/2017 10/04/2015 06/16/2014  Falls in the past year? 1 No No No  Number falls in past yr: 0 - - -  Injury with Fall? 0 - - -  Risk for fall due to : Other (Comment);History of fall(s);Impaired vision - - -  Risk for fall due to: Comment weakness from sepsis - - -  Follow up Falls prevention discussed - - -    Cognitive Function:       Ad8 score reviewed for issues:  Issues making  decisions: no  Less interest in hobbies / activities: no  Repeats questions, stories (family complaining): no  Trouble using ordinary gadgets (microwave, computer, phone):no  Forgets the month or year: no  Mismanaging finances: no  Remembering appts: no  Daily problems with thinking and/or memory: no Ad8 score is= 0  Screening Tests Health Maintenance  Topic Date Due  . Hepatitis C Screening  1945-12-08  . TETANUS/TDAP  11/06/2018 (Originally 02/06/2016)  . PNA vac Low Risk Adult (2 of 2 - PPSV23) 11/06/2018 (Originally 10/03/2016)  . INFLUENZA VACCINE  11/20/2018 (Originally 09/05/2017)  . COLONOSCOPY  02/06/2019 (Originally 12/08/2017)    Qualifies for Shingles Vaccine? Yes, but pt. refuses all vaccinations at this time.     Plan:    Schedule your physical with Dr. Sarajane Jews at your earliest convenience.   Follow-up with Dr. Sarajane Jews regarding ringing in ears and urinary frequency.  Refer to ophthomalogy providers given to you; call to see which providers are taking medicare and accepting new patients.  Consider receiving pneumonia and shingles vaccines once feeling better. Shingles vaccine you should receive at local pharmacy (cheaper that way). See education below on recommended vaccines.  Keep staying hydrated and eating high protein/high vegetable diet as you recover and prepare for surgery!  Good luck with your back surgery, and hang in there! Let us know if we can help you with anything I have personally reviewed and noted the following in the patient's  chart:   . Medical and social history . Use of alcohol, tobacco or illicit drugs  . Current medications and supplements . Functional ability and status . Nutritional status . Physical activity . Advanced directives . List of other physicians . Hospitalizations, surgeries, and ER visits in previous 12 months . Vitals . Screenings to include cognitive, depression, and falls . Referrals and appointments  In addition, I  have reviewed and discussed with patient certain preventive protocols, quality metrics, and best practice recommendations. A written personalized care plan for preventive services as well as general preventive health recommendations were provided to patient.     Alphia Moh, RN   04/14/2018    I have read this note and agree with its contents.  [name], MD

## 2018-04-14 ENCOUNTER — Encounter: Payer: Self-pay | Admitting: Family Medicine

## 2018-04-14 ENCOUNTER — Ambulatory Visit (INDEPENDENT_AMBULATORY_CARE_PROVIDER_SITE_OTHER): Payer: Medicare Other

## 2018-04-14 ENCOUNTER — Ambulatory Visit (INDEPENDENT_AMBULATORY_CARE_PROVIDER_SITE_OTHER): Payer: Medicare Other | Admitting: Family Medicine

## 2018-04-14 VITALS — BP 122/84 | HR 87 | Temp 97.8°F | Resp 18 | Ht 74.0 in | Wt 204.0 lb

## 2018-04-14 VITALS — BP 122/84 | HR 87 | Temp 97.8°F | Wt 204.0 lb

## 2018-04-14 DIAGNOSIS — Z0000 Encounter for general adult medical examination without abnormal findings: Secondary | ICD-10-CM

## 2018-04-14 DIAGNOSIS — K5792 Diverticulitis of intestine, part unspecified, without perforation or abscess without bleeding: Secondary | ICD-10-CM

## 2018-04-14 DIAGNOSIS — R350 Frequency of micturition: Secondary | ICD-10-CM

## 2018-04-14 DIAGNOSIS — H269 Unspecified cataract: Secondary | ICD-10-CM

## 2018-04-14 DIAGNOSIS — M5126 Other intervertebral disc displacement, lumbar region: Secondary | ICD-10-CM

## 2018-04-14 DIAGNOSIS — M0579 Rheumatoid arthritis with rheumatoid factor of multiple sites without organ or systems involvement: Secondary | ICD-10-CM | POA: Diagnosis not present

## 2018-04-14 LAB — POCT URINALYSIS DIPSTICK
Bilirubin, UA: NEGATIVE
Blood, UA: NEGATIVE
Glucose, UA: NEGATIVE
Ketones, UA: NEGATIVE
Leukocytes, UA: NEGATIVE
Nitrite, UA: NEGATIVE
Protein, UA: NEGATIVE
Spec Grav, UA: 1.015 (ref 1.010–1.025)
Urobilinogen, UA: 0.2 E.U./dL
pH, UA: 6.5 (ref 5.0–8.0)

## 2018-04-14 MED ORDER — METRONIDAZOLE 500 MG PO TABS: 500.0000 mg | ORAL_TABLET | Freq: Two times a day (BID) | ORAL | 0 refills | Status: DC

## 2018-04-14 MED ORDER — PREDNISONE 10 MG PO TABS: 10.0000 mg | ORAL_TABLET | Freq: Every day | ORAL | 2 refills | Status: DC

## 2018-04-14 MED ORDER — CIPROFLOXACIN HCL 500 MG PO TABS: 500.0000 mg | ORAL_TABLET | Freq: Two times a day (BID) | ORAL | 0 refills | Status: DC

## 2018-04-14 NOTE — Progress Notes (Signed)
   Subjective:    Patient ID: Tyrone Roman, male    DOB: 08/04/1945, 73 y.o.   MRN: [id number]  HPI Here for several issues. First for the past 3 days he has had lower abdominal pain, increased urgency to urinate, and frequency. No fever. He tends to be constipated and he drinks water and takes prune juice for that. He feels exactly like he did last December when we saw him. A urine culture then was negative and we treated him for diverticulitis. He responded well to Cipro and Flagyl. He is dealing with a lot  More low back pain and he will likely require a fusion surgery. He is taking a lot of Ibuprofen and he asks to try some Prednisone again. Finally he recently saw Dr. Katy Fitch for an eye exam and he has cataracts. Surgery was recommended but he is very uncertain about this.    Review of Systems  Constitutional: Negative.   Respiratory: Negative.   Cardiovascular: Negative.   Gastrointestinal: Positive for abdominal pain, constipation and vomiting. Negative for abdominal distention, anal bleeding, blood in stool, diarrhea and nausea.  Genitourinary: Positive for frequency and urgency. Negative for dysuria and hematuria.       Objective:   Physical Exam Constitutional:      Appearance: Normal appearance.  Cardiovascular:     Rate and Rhythm: Normal rate and regular rhythm.     Pulses: Normal pulses.     Heart sounds: Normal heart sounds.  Pulmonary:     Effort: Pulmonary effort is normal.     Breath sounds: Normal breath sounds.  Abdominal:     General: Abdomen is flat. Bowel sounds are normal. There is no distension.     Palpations: Abdomen is soft. There is no mass.     Tenderness: There is no guarding or rebound.     Hernia: No hernia is present.     Comments: Tender across the entire lower abdomen   Neurological:     Mental Status: He is alert.           Assessment & Plan:  For diverticulitis, treat with Cipro and Flagyl. For back pain and joint pains, get back of  Prednisone 10 mg daily. For the cataracts, we will refer him for a second opinion. [name], MD

## 2018-04-14 NOTE — Patient Instructions (Addendum)
Schedule your physical with Dr. Sarajane Jews at your earliest convenience.   Follow-up with Dr. Sarajane Jews regarding ringing in ears and urinary frequency.  Refer to ophthomalogy providers given to you; call to see which providers are taking medicare and accepting new patients.  Consider receiving pneumonia and shingles vaccines once feeling better. Shingles vaccine you should receive at local pharmacy (cheaper that way). See education below on recommended vaccines.  Keep staying hydrated and eating high protein/high vegetable diet as you recover and prepare for surgery!  Good luck with your back surgery, and hang in there! Let us know if we can help you with anything.    Tyrone Roman , Thank you for taking time to come for your Medicare Wellness Visit. I appreciate your ongoing commitment to your health goals. Please review the following plan we discussed and let me know if I can assist you in the future.   These are the goals we discussed: Goals    . Patient Stated     Remain free from infection! Schedule back surgery for spring       This is a list of the screening recommended for you and due dates:  Health Maintenance  Topic Date Due  .  Hepatitis C: One time screening is recommended by Center for Disease Control  (CDC) for  adults born from [birth year] through 1965.   [birth year]  . Tetanus Vaccine  11/06/2018*  . Pneumonia vaccines (2 of 2 - PPSV23) 11/06/2018*  . Flu Shot  11/20/2018*  . Colon Cancer Screening  02/06/2019*  *Topic was postponed. The date shown is not the original due date.    Zoster Vaccine, Recombinant injection What is this medicine? ZOSTER VACCINE (ZOS ter vak SEEN) is used to prevent shingles in adults 73 years old and over. This vaccine is not used to treat shingles or nerve pain from shingles. This medicine may be used for other purposes; ask your health care provider or pharmacist if you have questions. COMMON BRAND NAME(S): [facility] What should I tell my health care  provider before I take this medicine? They need to know if you have any of these conditions: -blood disorders or disease -cancer like leukemia or lymphoma -immune system problems or therapy -an unusual or allergic reaction to vaccines, other medications, foods, dyes, or preservatives -pregnant or trying to get pregnant -breast-feeding How should I use this medicine? This vaccine is for injection in a muscle. It is given by a health care professional. Talk to your pediatrician regarding the use of this medicine in children. This medicine is not approved for use in children. Overdosage: If you think you have taken too much of this medicine contact a poison control center or emergency room at once. NOTE: This medicine is only for you. Do not share this medicine with others. What if I miss a dose? Keep appointments for follow-up (booster) doses as directed. It is important not to miss your dose. Call your doctor or health care professional if you are unable to keep an appointment. What may interact with this medicine? -medicines that suppress your immune system -medicines to treat cancer -steroid medicines like prednisone or cortisone This list may not describe all possible interactions. Give your health care provider a list of all the medicines, herbs, non-prescription drugs, or dietary supplements you use. Also tell them if you smoke, drink alcohol, or use illegal drugs. Some items may interact with your medicine. What should I watch for while using this medicine? Visit your doctor for regular  check ups. This vaccine, like all vaccines, may not fully protect everyone. What side effects may I notice from receiving this medicine? Side effects that you should report to your doctor or health care professional as soon as possible: -allergic reactions like skin rash, itching or hives, swelling of the face, lips, or tongue -breathing problems Side effects that usually do not require medical  attention (report these to your doctor or health care professional if they continue or are bothersome): -chills -headache -fever -nausea, vomiting -redness, warmth, pain, swelling or itching at site where injected -tiredness This list may not describe all possible side effects. Call your doctor for medical advice about side effects. You may report side effects to FDA at 1-800-FDA-1088. Where should I keep my medicine? This vaccine is only given in a clinic, pharmacy, doctor's office, or other health care setting and will not be stored at home. NOTE: This sheet is a summary. It may not cover all possible information. If you have questions about this medicine, talk to your doctor, pharmacist, or health care provider.  2019 Elsevier/Gold Standard (2016-09-03 13:20:30) Pneumococcal Vaccine, Polyvalent solution for injection What is this medicine? PNEUMOCOCCAL VACCINE, POLYVALENT (NEU mo KOK al vak SEEN, pol ee VEY luhnt) is a vaccine to prevent pneumococcus bacteria infection. These bacteria are a major cause of ear infections, Strep throat infections, and serious pneumonia, meningitis, or blood infections worldwide. These vaccines help the body to produce antibodies (protective substances) that help your body defend against these bacteria. This vaccine is recommended for people 64 years of age and older with health problems. It is also recommended for all adults over 6 years old. This vaccine will not treat an infection. This medicine may be used for other purposes; ask your health care provider or pharmacist if you have questions. COMMON BRAND NAME(S): Pneumovax 23 What should I tell my health care provider before I take this medicine? They need to know if you have any of these conditions: -bleeding problems -bone marrow or organ transplant -cancer, Hodgkin's disease -fever -infection -immune system problems -low platelet count in the blood -seizures -an unusual or allergic reaction to  pneumococcal vaccine, diphtheria toxoid, other vaccines, latex, other medicines, foods, dyes, or preservatives -pregnant or trying to get pregnant -breast-feeding How should I use this medicine? This vaccine is for injection into a muscle or under the skin. It is given by a health care professional. A copy of Vaccine Information Statements will be given before each vaccination. Read this sheet carefully each time. The sheet may change frequently. Talk to your pediatrician regarding the use of this medicine in children. While this drug may be prescribed for children as young as 38 years of age for selected conditions, precautions do apply. Overdosage: If you think you have taken too much of this medicine contact a poison control center or emergency room at once. NOTE: This medicine is only for you. Do not share this medicine with others. What if I miss a dose? It is important not to miss your dose. Call your doctor or health care professional if you are unable to keep an appointment. What may interact with this medicine? -medicines for cancer chemotherapy -medicines that suppress your immune function -medicines that treat or prevent blood clots like warfarin, enoxaparin, and dalteparin -steroid medicines like prednisone or cortisone This list may not describe all possible interactions. Give your health care provider a list of all the medicines, herbs, non-prescription drugs, or dietary supplements you use. Also tell them if you  smoke, drink alcohol, or use illegal drugs. Some items may interact with your medicine. What should I watch for while using this medicine? Mild fever and pain should go away in 3 days or less. Report any unusual symptoms to your doctor or health care professional. What side effects may I notice from receiving this medicine? Side effects that you should report to your doctor or health care professional as soon as possible: -allergic reactions like skin rash, itching or  hives, swelling of the face, lips, or tongue -breathing problems -confused -fever over 102 degrees F -pain, tingling, numbness in the hands or feet -seizures -unusual bleeding or bruising -unusual muscle weakness Side effects that usually do not require medical attention (report to your doctor or health care professional if they continue or are bothersome): -aches and pains -diarrhea -fever of 102 degrees F or less -headache -irritable -loss of appetite -pain, tender at site where injected -trouble sleeping This list may not describe all possible side effects. Call your doctor for medical advice about side effects. You may report side effects to FDA at 1-800-FDA-1088. Where should I keep my medicine? This does not apply. This vaccine is given in a clinic, pharmacy, doctor's office, or other health care setting and will not be stored at home. NOTE: This sheet is a summary. It may not cover all possible information. If you have questions about this medicine, talk to your doctor, pharmacist, or health care provider.  2019 Elsevier/Gold Standard (2007-08-29 14:32:37)   Fall Prevention in the Home, Adult Falls can cause injuries. They can happen to people of all ages. There are many things you can do to make your home safe and to help prevent falls. Ask for help when making these changes, if needed. What actions can I take to prevent falls? General Instructions  Use good lighting in all rooms. Replace any light bulbs that burn out.  Turn on the lights when you go into a dark area. Use night-lights.  Keep items that you use often in easy-to-reach places. Lower the shelves around your home if necessary.  Set up your furniture so you have a clear path. Avoid moving your furniture around.  Do not have throw rugs and other things on the floor that can make you trip.  Avoid walking on wet floors.  If any of your floors are uneven, fix them.  Add color or contrast paint or tape to  clearly mark and help you see: ? Any grab bars or handrails. ? First and last steps of stairways. ? Where the edge of each step is.  If you use a stepladder: ? Make sure that it is fully opened. Do not climb a closed stepladder. ? Make sure that both sides of the stepladder are locked into place. ? Ask someone to hold the stepladder for you while you use it.  If there are any pets around you, be aware of where they are. What can I do in the bathroom?      Keep the floor dry. Clean up any water that spills onto the floor as soon as it happens.  Remove soap buildup in the tub or shower regularly.  Use non-skid mats or decals on the floor of the tub or shower.  Attach bath mats securely with double-sided, non-slip rug tape.  If you need to sit down in the shower, use a plastic, non-slip stool.  Install grab bars by the toilet and in the tub and shower. Do not use towel bars as  grab bars. What can I do in the bedroom?  Make sure that you have a light by your bed that is easy to reach.  Do not use any sheets or blankets that are too big for your bed. They should not hang down onto the floor.  Have a firm chair that has side arms. You can use this for support while you get dressed. What can I do in the kitchen?  Clean up any spills right away.  If you need to reach something above you, use a strong step stool that has a grab bar.  Keep electrical cords out of the way.  Do not use floor polish or wax that makes floors slippery. If you must use wax, use non-skid floor wax. What can I do with my stairs?  Do not leave any items on the stairs.  Make sure that you have a light switch at the top of the stairs and the bottom of the stairs. If you do not have them, ask someone to add them for you.  Make sure that there are handrails on both sides of the stairs, and use them. Fix handrails that are broken or loose. Make sure that handrails are as long as the stairways.  Install  non-slip stair treads on all stairs in your home.  Avoid having throw rugs at the top or bottom of the stairs. If you do have throw rugs, attach them to the floor with carpet tape.  Choose a carpet that does not hide the edge of the steps on the stairway.  Check any carpeting to make sure that it is firmly attached to the stairs. Fix any carpet that is loose or worn. What can I do on the outside of my home?  Use bright outdoor lighting.  Regularly fix the edges of walkways and driveways and fix any cracks.  Remove anything that might make you trip as you walk through a door, such as a raised step or threshold.  Trim any bushes or trees on the path to your home.  Regularly check to see if handrails are loose or broken. Make sure that both sides of any steps have handrails.  Install guardrails along the edges of any raised decks and porches.  Clear walking paths of anything that might make someone trip, such as tools or rocks.  Have any leaves, snow, or ice cleared regularly.  Use sand or salt on walking paths during winter.  Clean up any spills in your garage right away. This includes grease or oil spills. What other actions can I take?  Wear shoes that: ? Have a low heel. Do not wear high heels. ? Have rubber bottoms. ? Are comfortable and fit you well. ? Are closed at the toe. Do not wear open-toe sandals.  Use tools that help you move around (mobility aids) if they are needed. These include: ? Canes. ? Walkers. ? Scooters. ? Crutches.  Review your medicines with your doctor. Some medicines can make you feel dizzy. This can increase your chance of falling. Ask your doctor what other things you can do to help prevent falls. Where to find more information  Centers for Disease Control and Prevention, STEADI: https://garcia.biz/  [employer] on Aging: BrainJudge.co.uk Contact a doctor if:  You are afraid of falling at home.  You feel weak, drowsy, or  dizzy at home.  You fall at home. Summary  There are many simple things that you can do to make your home safe and to  help prevent falls.  Ways to make your home safe include removing tripping hazards and installing grab bars in the bathroom.  Ask for help when making these changes in your home. This information is not intended to replace advice given to you by your health care provider. Make sure you discuss any questions you have with your health care provider. Document Released: 11/18/2008 Document Revised: 09/06/2016 Document Reviewed: 09/06/2016 Elsevier Interactive Patient Education  2019 [employer] Maintenance, Male A healthy lifestyle and preventive care is important for your health and wellness. Ask your health care provider about what schedule of regular examinations is right for you. What should I know about weight and diet? Eat a Healthy Diet  Eat plenty of vegetables, fruits, whole grains, low-fat dairy products, and lean protein.  Do not eat a lot of foods high in solid fats, added sugars, or salt.  Maintain a Healthy Weight Regular exercise can help you achieve or maintain a healthy weight. You should:  Do at least 150 minutes of exercise each week. The exercise should increase your heart rate and make you sweat (moderate-intensity exercise).  Do strength-training exercises at least twice a week. Watch Your Levels of Cholesterol and Blood Lipids  Have your blood tested for lipids and cholesterol every 5 years starting at 73 years of age. If you are at high risk for heart disease, you should start having your blood tested when you are 73 years old. You may need to have your cholesterol levels checked more often if: ? Your lipid or cholesterol levels are high. ? You are older than 73 years of age. ? You are at high risk for heart disease. What should I know about cancer screening? Many types of cancers can be detected early and may often be  prevented. Lung Cancer  You should be screened every year for lung cancer if: ? You are a current smoker who has smoked for at least 30 years. ? You are a former smoker who has quit within the past 15 years.  Talk to your health care provider about your screening options, when you should start screening, and how often you should be screened. Colorectal Cancer  Routine colorectal cancer screening usually begins at 73 years of age and should be repeated every 5-10 years until you are 73 years old. You may need to be screened more often if early forms of precancerous polyps or small growths are found. Your health care provider may recommend screening at an earlier age if you have risk factors for colon cancer.  Your health care provider may recommend using home test kits to check for hidden blood in the stool.  A small camera at the end of a tube can be used to examine your colon (sigmoidoscopy or colonoscopy). This checks for the earliest forms of colorectal cancer. Prostate and Testicular Cancer  Depending on your age and overall health, your health care provider may do certain tests to screen for prostate and testicular cancer.  Talk to your health care provider about any symptoms or concerns you have about testicular or prostate cancer. Skin Cancer  Check your skin from head to toe regularly.  Tell your health care provider about any new moles or changes in moles, especially if: ? There is a change in a mole's size, shape, or color. ? You have a mole that is larger than a pencil eraser.  Always use sunscreen. Apply sunscreen liberally and repeat throughout the day.  Protect yourself by  wearing long sleeves, pants, a wide-brimmed hat, and sunglasses when outside. What should I know about heart disease, diabetes, and high blood pressure?  If you are 44-57 years of age, have your blood pressure checked every 3-5 years. If you are 52 years of age or older, have your blood pressure  checked every year. You should have your blood pressure measured twice-once when you are at a hospital or clinic, and once when you are not at a hospital or clinic. Record the average of the two measurements. To check your blood pressure when you are not at a hospital or clinic, you can use: ? An automated blood pressure machine at a pharmacy. ? A home blood pressure monitor.  Talk to your health care provider about your target blood pressure.  If you are between 62-37 years old, ask your health care provider if you should take aspirin to prevent heart disease.  Have regular diabetes screenings by checking your fasting blood sugar level. ? If you are at a normal weight and have a low risk for diabetes, have this test once every three years after the age of 37. ? If you are overweight and have a high risk for diabetes, consider being tested at a younger age or more often.  A one-time screening for abdominal aortic aneurysm (AAA) by ultrasound is recommended for men aged 88-75 years who are current or former smokers. What should I know about preventing infection? Hepatitis B If you have a higher risk for hepatitis B, you should be screened for this virus. Talk with your health care provider to find out if you are at risk for hepatitis B infection. Hepatitis C Blood testing is recommended for:  Everyone born from [birth year] through 1965.  Anyone with known risk factors for hepatitis C. Sexually Transmitted Diseases (STDs)  You should be screened each year for STDs including gonorrhea and chlamydia if: ? You are sexually active and are younger than 73 years of age. ? You are older than 73 years of age and your health care provider tells you that you are at risk for this type of infection. ? Your sexual activity has changed since you were last screened and you are at an increased risk for chlamydia or gonorrhea. Ask your health care provider if you are at risk.  Talk with your health care provider  about whether you are at high risk of being infected with HIV. Your health care provider may recommend a prescription medicine to help prevent HIV infection. What else can I do?  Schedule regular health, dental, and eye exams.  Stay current with your vaccines (immunizations).  Do not use any tobacco products, such as cigarettes, chewing tobacco, and e-cigarettes. If you need help quitting, ask your health care provider.  Limit alcohol intake to no more than 2 drinks per day. One drink equals 12 ounces of beer, 5 ounces of wine, or 1 ounces of hard liquor.  Do not use street drugs.  Do not share needles.  Ask your health care provider for help if you need support or information about quitting drugs.  Tell your health care provider if you often feel depressed.  Tell your health care provider if you have ever been abused or do not feel safe at home. This information is not intended to replace advice given to you by your health care provider. Make sure you discuss any questions you have with your health care provider. Document Released: 07/21/2007 Document Revised: 09/21/2015 Document Reviewed: 10/26/2014 Elsevier  Interactive Patient Education  [employer].

## 2018-06-16 ENCOUNTER — Other Ambulatory Visit: Payer: Self-pay | Admitting: Family Medicine

## 2018-06-24 DIAGNOSIS — M7660 Achilles tendinitis, unspecified leg: Secondary | ICD-10-CM | POA: Diagnosis not present

## 2018-06-24 DIAGNOSIS — M2550 Pain in unspecified joint: Secondary | ICD-10-CM | POA: Diagnosis not present

## 2018-06-24 DIAGNOSIS — L409 Psoriasis, unspecified: Secondary | ICD-10-CM | POA: Diagnosis not present

## 2018-06-24 DIAGNOSIS — M0579 Rheumatoid arthritis with rheumatoid factor of multiple sites without organ or systems involvement: Secondary | ICD-10-CM | POA: Diagnosis not present

## 2018-06-24 DIAGNOSIS — L4050 Arthropathic psoriasis, unspecified: Secondary | ICD-10-CM | POA: Diagnosis not present

## 2018-06-24 DIAGNOSIS — M150 Primary generalized (osteo)arthritis: Secondary | ICD-10-CM | POA: Diagnosis not present

## 2018-07-14 DIAGNOSIS — M0579 Rheumatoid arthritis with rheumatoid factor of multiple sites without organ or systems involvement: Secondary | ICD-10-CM | POA: Diagnosis not present

## 2018-08-06 ENCOUNTER — Other Ambulatory Visit: Payer: Self-pay | Admitting: Family Medicine

## 2018-08-06 NOTE — Telephone Encounter (Signed)
Dr. Fry please advise. Thanks  

## 2018-08-11 DIAGNOSIS — M0579 Rheumatoid arthritis with rheumatoid factor of multiple sites without organ or systems involvement: Secondary | ICD-10-CM | POA: Diagnosis not present

## 2018-08-11 DIAGNOSIS — H2513 Age-related nuclear cataract, bilateral: Secondary | ICD-10-CM | POA: Diagnosis not present

## 2018-08-11 DIAGNOSIS — H353132 Nonexudative age-related macular degeneration, bilateral, intermediate dry stage: Secondary | ICD-10-CM | POA: Diagnosis not present

## 2018-08-11 DIAGNOSIS — H35013 Changes in retinal vascular appearance, bilateral: Secondary | ICD-10-CM | POA: Diagnosis not present

## 2018-08-11 DIAGNOSIS — H04123 Dry eye syndrome of bilateral lacrimal glands: Secondary | ICD-10-CM | POA: Diagnosis not present

## 2018-09-08 DIAGNOSIS — Z79899 Other long term (current) drug therapy: Secondary | ICD-10-CM | POA: Diagnosis not present

## 2018-09-08 DIAGNOSIS — M0579 Rheumatoid arthritis with rheumatoid factor of multiple sites without organ or systems involvement: Secondary | ICD-10-CM | POA: Diagnosis not present

## 2018-09-08 DIAGNOSIS — M150 Primary generalized (osteo)arthritis: Secondary | ICD-10-CM | POA: Diagnosis not present

## 2018-09-08 DIAGNOSIS — M2550 Pain in unspecified joint: Secondary | ICD-10-CM | POA: Diagnosis not present

## 2018-09-08 DIAGNOSIS — L409 Psoriasis, unspecified: Secondary | ICD-10-CM | POA: Diagnosis not present

## 2018-09-08 DIAGNOSIS — M7660 Achilles tendinitis, unspecified leg: Secondary | ICD-10-CM | POA: Diagnosis not present

## 2018-09-17 DIAGNOSIS — H2513 Age-related nuclear cataract, bilateral: Secondary | ICD-10-CM | POA: Diagnosis not present

## 2018-09-17 DIAGNOSIS — H353132 Nonexudative age-related macular degeneration, bilateral, intermediate dry stage: Secondary | ICD-10-CM | POA: Diagnosis not present

## 2018-09-17 DIAGNOSIS — H2512 Age-related nuclear cataract, left eye: Secondary | ICD-10-CM | POA: Diagnosis not present

## 2018-09-17 DIAGNOSIS — H25042 Posterior subcapsular polar age-related cataract, left eye: Secondary | ICD-10-CM | POA: Diagnosis not present

## 2018-09-17 DIAGNOSIS — H25013 Cortical age-related cataract, bilateral: Secondary | ICD-10-CM | POA: Diagnosis not present

## 2018-09-30 DIAGNOSIS — H2512 Age-related nuclear cataract, left eye: Secondary | ICD-10-CM | POA: Diagnosis not present

## 2018-09-30 DIAGNOSIS — H25812 Combined forms of age-related cataract, left eye: Secondary | ICD-10-CM | POA: Diagnosis not present

## 2018-09-30 DIAGNOSIS — H2513 Age-related nuclear cataract, bilateral: Secondary | ICD-10-CM | POA: Diagnosis not present

## 2018-09-30 DIAGNOSIS — H25013 Cortical age-related cataract, bilateral: Secondary | ICD-10-CM | POA: Diagnosis not present

## 2018-09-30 DIAGNOSIS — H25042 Posterior subcapsular polar age-related cataract, left eye: Secondary | ICD-10-CM | POA: Diagnosis not present

## 2018-10-06 DIAGNOSIS — M0579 Rheumatoid arthritis with rheumatoid factor of multiple sites without organ or systems involvement: Secondary | ICD-10-CM | POA: Diagnosis not present

## 2018-10-14 DIAGNOSIS — D696 Thrombocytopenia, unspecified: Secondary | ICD-10-CM | POA: Diagnosis not present

## 2018-10-14 DIAGNOSIS — R7989 Other specified abnormal findings of blood chemistry: Secondary | ICD-10-CM | POA: Diagnosis not present

## 2018-10-22 DIAGNOSIS — H25011 Cortical age-related cataract, right eye: Secondary | ICD-10-CM | POA: Diagnosis not present

## 2018-10-22 DIAGNOSIS — H2511 Age-related nuclear cataract, right eye: Secondary | ICD-10-CM | POA: Diagnosis not present

## 2018-10-28 DIAGNOSIS — H25011 Cortical age-related cataract, right eye: Secondary | ICD-10-CM | POA: Diagnosis not present

## 2018-10-28 DIAGNOSIS — H25811 Combined forms of age-related cataract, right eye: Secondary | ICD-10-CM | POA: Diagnosis not present

## 2018-10-28 DIAGNOSIS — H2511 Age-related nuclear cataract, right eye: Secondary | ICD-10-CM | POA: Diagnosis not present

## 2018-11-03 DIAGNOSIS — D696 Thrombocytopenia, unspecified: Secondary | ICD-10-CM | POA: Diagnosis not present

## 2018-11-17 DIAGNOSIS — Z23 Encounter for immunization: Secondary | ICD-10-CM | POA: Diagnosis not present

## 2018-12-01 DIAGNOSIS — M0579 Rheumatoid arthritis with rheumatoid factor of multiple sites without organ or systems involvement: Secondary | ICD-10-CM | POA: Diagnosis not present

## 2018-12-04 ENCOUNTER — Other Ambulatory Visit: Payer: Self-pay

## 2018-12-09 DIAGNOSIS — M150 Primary generalized (osteo)arthritis: Secondary | ICD-10-CM | POA: Diagnosis not present

## 2018-12-09 DIAGNOSIS — M7660 Achilles tendinitis, unspecified leg: Secondary | ICD-10-CM | POA: Diagnosis not present

## 2018-12-09 DIAGNOSIS — M2550 Pain in unspecified joint: Secondary | ICD-10-CM | POA: Diagnosis not present

## 2018-12-09 DIAGNOSIS — Z79899 Other long term (current) drug therapy: Secondary | ICD-10-CM | POA: Diagnosis not present

## 2018-12-09 DIAGNOSIS — M0579 Rheumatoid arthritis with rheumatoid factor of multiple sites without organ or systems involvement: Secondary | ICD-10-CM | POA: Diagnosis not present

## 2018-12-09 DIAGNOSIS — R21 Rash and other nonspecific skin eruption: Secondary | ICD-10-CM | POA: Diagnosis not present

## 2018-12-10 DIAGNOSIS — H02835 Dermatochalasis of left lower eyelid: Secondary | ICD-10-CM | POA: Diagnosis not present

## 2018-12-10 DIAGNOSIS — H57813 Brow ptosis, bilateral: Secondary | ICD-10-CM | POA: Diagnosis not present

## 2018-12-10 DIAGNOSIS — H0279 Other degenerative disorders of eyelid and periocular area: Secondary | ICD-10-CM | POA: Diagnosis not present

## 2018-12-10 DIAGNOSIS — H02423 Myogenic ptosis of bilateral eyelids: Secondary | ICD-10-CM | POA: Diagnosis not present

## 2018-12-10 DIAGNOSIS — H02834 Dermatochalasis of left upper eyelid: Secondary | ICD-10-CM | POA: Diagnosis not present

## 2018-12-10 DIAGNOSIS — H02832 Dermatochalasis of right lower eyelid: Secondary | ICD-10-CM | POA: Diagnosis not present

## 2018-12-10 DIAGNOSIS — H02831 Dermatochalasis of right upper eyelid: Secondary | ICD-10-CM | POA: Diagnosis not present

## 2018-12-10 DIAGNOSIS — H53483 Generalized contraction of visual field, bilateral: Secondary | ICD-10-CM | POA: Diagnosis not present

## 2018-12-10 DIAGNOSIS — H02413 Mechanical ptosis of bilateral eyelids: Secondary | ICD-10-CM | POA: Diagnosis not present

## 2018-12-17 DIAGNOSIS — H53481 Generalized contraction of visual field, right eye: Secondary | ICD-10-CM | POA: Diagnosis not present

## 2018-12-17 DIAGNOSIS — H53482 Generalized contraction of visual field, left eye: Secondary | ICD-10-CM | POA: Diagnosis not present

## 2018-12-17 DIAGNOSIS — H53483 Generalized contraction of visual field, bilateral: Secondary | ICD-10-CM | POA: Diagnosis not present

## 2019-01-06 DIAGNOSIS — M0579 Rheumatoid arthritis with rheumatoid factor of multiple sites without organ or systems involvement: Secondary | ICD-10-CM | POA: Diagnosis not present

## 2019-01-13 ENCOUNTER — Other Ambulatory Visit: Payer: Self-pay | Admitting: Family Medicine

## 2019-01-21 ENCOUNTER — Other Ambulatory Visit: Payer: Self-pay | Admitting: Family Medicine

## 2019-02-09 DIAGNOSIS — H00015 Hordeolum externum left lower eyelid: Secondary | ICD-10-CM | POA: Diagnosis not present

## 2019-02-12 DIAGNOSIS — H00015 Hordeolum externum left lower eyelid: Secondary | ICD-10-CM | POA: Diagnosis not present

## 2019-02-17 ENCOUNTER — Other Ambulatory Visit: Payer: Self-pay | Admitting: Ophthalmology

## 2019-02-17 DIAGNOSIS — H0015 Chalazion left lower eyelid: Secondary | ICD-10-CM | POA: Diagnosis not present

## 2019-02-17 DIAGNOSIS — H53453 Other localized visual field defect, bilateral: Secondary | ICD-10-CM | POA: Diagnosis not present

## 2019-02-17 DIAGNOSIS — H109 Unspecified conjunctivitis: Secondary | ICD-10-CM | POA: Diagnosis not present

## 2019-02-17 DIAGNOSIS — H02834 Dermatochalasis of left upper eyelid: Secondary | ICD-10-CM | POA: Diagnosis not present

## 2019-02-17 DIAGNOSIS — H02831 Dermatochalasis of right upper eyelid: Secondary | ICD-10-CM | POA: Diagnosis not present

## 2019-02-19 ENCOUNTER — Other Ambulatory Visit: Payer: Self-pay | Admitting: Family Medicine

## 2019-02-25 ENCOUNTER — Encounter (HOSPITAL_COMMUNITY): Payer: Self-pay

## 2019-02-25 ENCOUNTER — Ambulatory Visit (HOSPITAL_COMMUNITY)
Admission: EM | Admit: 2019-02-25 | Discharge: 2019-02-25 | Disposition: A | Discharge status: Home / Self Care | Payer: Medicare Other | Attending: Family Medicine | Admitting: Family Medicine

## 2019-02-25 ENCOUNTER — Ambulatory Visit (INDEPENDENT_AMBULATORY_CARE_PROVIDER_SITE_OTHER): Payer: Medicare Other

## 2019-02-25 ENCOUNTER — Other Ambulatory Visit: Payer: Self-pay

## 2019-02-25 DIAGNOSIS — S62353A Nondisplaced fracture of shaft of third metacarpal bone, left hand, initial encounter for closed fracture: Secondary | ICD-10-CM

## 2019-02-25 NOTE — Progress Notes (Signed)
Orthopedic Tech Progress Note Patient Details:  Tyrone Roman Oct 01, 1945 [id number]  Ortho Devices Type of Ortho Device: Ulna gutter splint Ortho Device/Splint Location: LUE Ortho Device/Splint Interventions: Application, Ordered   Post Interventions Patient Tolerated: Well Instructions Provided: Poper ambulation with device, Care of device, Adjustment of device   Janit Pagan 02/25/2019, 7:12 PM

## 2019-02-25 NOTE — ED Notes (Signed)
Ortho Tech Paged

## 2019-02-25 NOTE — ED Notes (Signed)
Called ortho tech 

## 2019-02-25 NOTE — ED Triage Notes (Signed)
Pt c/o hand injury/pain to left hand s/p fall today from standing position into side of metal building and then onto asphalt. Denies LOC but felt dizzy briefly with mild nausea which resolved quickly. Denies changes to vision/hearing.  Dorsal portion of hand slight edema, tender to lightest touch, has been placing ice. Pt states edema has greatly improved since applying ice. Positive sensation/movement to hand; +2 radial pulse.  Pt states had cataract and ptsosis surgery on 02/17/19; ecchymosis to bilat eye areas noted from surgery.

## 2019-02-25 NOTE — Discharge Instructions (Signed)
Please follow up in about one week Please try tylenol for pain

## 2019-02-25 NOTE — ED Notes (Signed)
Ortho tech completed splint application.

## 2019-02-25 NOTE — ED Provider Notes (Signed)
[place]    CSN: [id number] Arrival date & time: 02/25/19  [place]      History   Chief Complaint Chief Complaint  Patient presents with  . Hand Injury    HPI SIGFREDO BARGAR is a 74 y.o. male. He is presenting with left hand pain. He fell earlier today and land on the dorsum of his left hand. Since that time his fingers and hand have been swollen. He has trouble moving his fingers. No trouble at the wrist. Pain is worse with palpation. No numbness or tingling.   HPI  Past Medical History:  Diagnosis Date  . Barrett's esophageal ulceration   . BPH (benign prostatic hyperplasia)   . CAD (coronary artery disease)    sees Dr. Mar Daring  . Cataract    both eyes  . Colonic polyp   . Concussion with loss of consciousness of 30 minutes or less   . Contact dermatitis   . Contact with or exposure to venereal diseases   . COPD (chronic obstructive pulmonary disease) ([place])    sees Dr. Kara Mead   . Diverticulosis of colon   . Dysphagia   . GERD (gastroesophageal reflux disease)   . HNP (herniated nucleus pulposus), lumbar    recurrent  . Hyperlipidemia   . Hypertension   . Laceration of scalp   . Myocardial infarction [facility])    2011- stent placed  . Nephrolithiasis   . Pre-operative cardiovascular examination   . Rheumatoid arthritis [facility])    sees Dr. Gavin Pound   . SOB (shortness of breath)     Patient Active Problem List   Diagnosis Date Noted  . Wound infection after surgery 01/01/2018  . Medication monitoring encounter 01/01/2018  . AKI (acute kidney injury) ([place]) 12/10/2017  . HNP (herniated nucleus pulposus), lumbar 10/07/2017  . Lumbar herniated disc 10/07/2017  . Psoriasis of scalp 03/27/2016  . Rheumatoid arthritis ([place]) 03/25/2013  . GI bleeding 02/20/2012  . Dyspnea on exertion 09/08/2010  . COPD (chronic obstructive pulmonary disease) ([place]) 08/04/2010  . Bruising 08/04/2010  . TINNITUS 12/16/2009  . Dizziness and giddiness 12/16/2009  .  NECK SPRAIN AND STRAIN 10/21/2009  . LUMBAR SPRAIN AND STRAIN 10/21/2009  . Hyperlipidemia 06/10/2009  . Coronary artery disease of native artery of native heart with stable angina pectoris ([place]) 06/10/2009  . GERD 06/10/2009  . BARRETTS ESOPHAGUS 06/10/2009  . CONCUSSION WITH LOC OF 30 MINUTES OR LESS 02/28/2009  . NEPHROLITHIASIS 07/01/2007  . CONTACT DERMATITIS 12/12/2006  . BPH with urinary obstruction 11/04/2006  . COLONIC POLYPS 10/21/2003  . DIVERTICULOSIS, COLON 10/21/2003    Past Surgical History:  Procedure Laterality Date  . BACK SURGERY    . CARDIAC CATHETERIZATION N/A 01/19/2016   Procedure: Left Heart Cath and Coronary Angiography;  Surgeon: Burnell Blanks, MD;  Location: [place] CV [facility];  Service: Cardiovascular;  Laterality: N/A;  . COLONOSCOPY  12/09/2014   per Dr. Silverio Decamp, adenomatous polyps, repeat 3 years   . CORONARY ANGIOPLASTY WITH STENT PLACEMENT    . ESOPHAGOGASTRODUODENOSCOPY (EGD) WITH ESOPHAGEAL DILATION  02/17/09   Barretts esophagus   . EYE SURGERY    . KNEE ARTHROSCOPY Left X 2  . LUMBAR LAMINECTOMY/DECOMPRESSION MICRODISCECTOMY Right 10/07/2017   Procedure: RIGHT LUMBAR ONE-TWO LAMINECTOMY WITH MICRODISCECTOMY;  Surgeon: Consuella Lose, MD;  Location: [place];  Service: Neurosurgery;  Laterality: Right;  . LUMBAR LAMINECTOMY/DECOMPRESSION MICRODISCECTOMY N/A 12/06/2017   Procedure: RECURRENT MICRODISCECTOMY LUMBAR ONE - LUMBAR TWO;  Surgeon: Kathyrn Sheriff,  Nena Polio, MD;  Location: [place];  Service: Neurosurgery;  Laterality: N/A;  . LUMBAR WOUND DEBRIDEMENT N/A 12/11/2017   Procedure: LUMBAR WOUND DEBRIDEMENT;  Surgeon: Consuella Lose, MD;  Location: [place];  Service: Neurosurgery;  Laterality: N/A;  . TONSILLECTOMY         Home Medications    Prior to Admission medications   Medication Sig Start Date End Date Taking? Authorizing Provider  metoprolol succinate (TOPROL-XL) 25 MG 24 hr tablet TAKE ONE TABLET BY MOUTH DAILY 02/19/19  Yes  [name], MD  omeprazole (PRILOSEC) 40 MG capsule Take 40 mg by mouth daily.   Yes [provider]  potassium chloride SA (KLOR-CON M20) 20 MEQ tablet Take 1 tablet (20 mEq total) by mouth daily. 12/24/17  Yes [name], MD  simvastatin (ZOCOR) 40 MG tablet TAKE ONE TABLET BY MOUTH DAILY 01/10/18  Yes [name], MD  acetaminophen (TYLENOL) 325 MG tablet Take 2 tablets (650 mg total) by mouth every 6 (six) hours as needed for mild pain, fever or headache. 12/17/17   Cherene Altes, MD  aspirin EC 81 MG tablet Take 1 tablet (81 mg total) by mouth daily. 12/11/17   Costella, Vista Mink, PA-C  ciprofloxacin (CIPRO) 500 MG tablet Take 1 tablet (500 mg total) by mouth 2 (two) times daily. 04/14/18   [name], MD  metroNIDAZOLE (FLAGYL) 500 MG tablet Take 1 tablet (500 mg total) by mouth 2 (two) times daily. 04/14/18   [name], MD  nitroGLYCERIN (NITROSTAT) 0.4 MG SL tablet Place 1 tablet (0.4 mg total) under the tongue every 5 (five) minutes as needed for chest pain (3 doses max). 12/20/17   [name], MD  predniSONE (DELTASONE) 10 MG tablet Take 1 tablet (10 mg total) by mouth daily with breakfast. 04/14/18   [name], MD  sildenafil (REVATIO) 20 MG tablet TAKE ONE TABLET BY MOUTH DAILY AS NEEDED 06/18/18   [name], MD  tamsulosin (FLOMAX) 0.4 MG CAPS capsule TAKE ONE CAPSULE BY MOUTH DAILY 02/19/19   [name], MD  traMADol (ULTRAM) 50 MG tablet Take 1-2 tablets (50-100 mg total) by mouth 3 (three) times daily as needed. 12/24/17   Leandrew Koyanagi, MD  zolpidem (AMBIEN) 10 MG tablet TAKE ONE TABLET BY MOUTH EVERY NIGHT AT BEDTIME 08/07/18   [name], MD    Family History Family History  Problem Relation Age of Onset  . Heart attack Father        cardiovascular disorder  . Arthritis Father        family hx  . Colon cancer Father        mets  . Prostate cancer Father        1st degree relative  . Arthritis Mother   . Diabetes Paternal  Uncle   . Colon cancer Paternal Uncle   . Cancer Maternal Grandmother   . Skin cancer Daughter   . Esophageal cancer Neg Hx   . Stomach cancer Neg Hx   . Rectal cancer Neg Hx     Social History Social History   Tobacco Use  . Smoking status: Former Smoker    Packs/day: 1.00    Years: 20.00    Pack years: 20.00    Types: Cigarettes    Quit date: 10/12/1988    Years since quitting: 30.3  . Smokeless tobacco: Never Used  . Tobacco comment: 1960's   Substance Use Topics  . Alcohol  use: Yes    Alcohol/week: 0.0 standard drinks    Comment: rare  . Drug use: No     Allergies   Cephalexin, Clarithromycin, Certolizumab pegol, Doxycycline, Hydroxyzine, Lidoderm, and Pyrithione zinc   Review of Systems Review of Systems See hpi   Physical Exam Triage Vital Signs ED Triage Vitals  Enc Vitals Group     BP 02/25/19 1751 (!) 159/98     Pulse Rate 02/25/19 1751 100     Resp 02/25/19 1751 20     Temp 02/25/19 1751 98.4 F (36.9 C)     Temp Source 02/25/19 1751 Oral     SpO2 02/25/19 1751 98 %     Weight --      Height --      Head Circumference --      Peak Flow --      Pain Score 02/25/19 1746 3     Pain Loc --      Pain Edu? --      Excl. in [place]? --    No data found.  Updated Vital Signs BP (!) 159/98 (BP Location: Right Arm)   Pulse 100   Temp 98.4 F (36.9 C) (Oral)   Resp 20   SpO2 98%   Visual Acuity Right Eye Distance:   Left Eye Distance:   Bilateral Distance:    Right Eye Near:   Left Eye Near:    Bilateral Near:     Physical Exam Gen: NAD, alert, cooperative with exam, well-appearing ENT: normal lips, normal nasal mucosa,  Eye: normal EOM, normal conjunctiva and lids CV:  no edema, +2 pedal pulses   Resp: no accessory muscle use, non-labored,  Skin: no rashes, no areas of induration  Neuro: normal tone, normal sensation to touch Psych:  normal insight, alert and oriented MSK:  Left hand:  Obvious swelling of the digits and dorsum of the  hand. Most tender over the third metacarpal. No significant malrotation or misalignment. No tenderness over the carpal bones or the distal radius or ulna. Neurovascularly intact  UC Treatments / Results  Labs (all labs ordered are listed, but only abnormal results are displayed) Labs Reviewed - No data to display  EKG   Radiology DG Hand Complete Left  Result Date: 02/25/2019 CLINICAL DATA:  Injury, edema EXAM: LEFT HAND - COMPLETE 3+ VIEW COMPARISON:  None. FINDINGS: Acute nondisplaced fracture involving the mid and proximal shaft of third metacarpal. No subluxation. Degenerative change at the STT interval. IMPRESSION: Acute nondisplaced fracture involving the mid to proximal third metacarpal Electronically Signed   By: Donavan Foil M.D.   On: 02/25/2019 18:44    Procedures Procedures (including critical care time)  Medications Ordered in UC Medications - No data to display  Initial Impression / Assessment and Plan / UC Course  I have reviewed the triage vital signs and the nursing notes.  Pertinent labs & imaging results that were available during my care of the patient were reviewed by me and considered in my medical decision making (see chart for details).     Mr. Stencil is a 74 year old male that is presenting with a left nondisplaced fracture of the third metacarpal shaft.  Placed in a splint today.  Counseled supportive care.  Given indications on when to follow-up.  Final Clinical Impressions(s) / UC Diagnoses   Final diagnoses:  Nondisplaced fracture of shaft of third metacarpal bone, left hand, initial encounter for closed fracture     Discharge Instructions  Please follow up in about one week Please try tylenol for pain     ED Prescriptions    None     PDMP not reviewed this encounter.   Rosemarie Ax, MD 02/25/19 1945

## 2019-03-04 ENCOUNTER — Ambulatory Visit (INDEPENDENT_AMBULATORY_CARE_PROVIDER_SITE_OTHER): Payer: Medicare Other | Admitting: Family Medicine

## 2019-03-04 ENCOUNTER — Other Ambulatory Visit: Payer: Self-pay

## 2019-03-04 ENCOUNTER — Encounter: Payer: Self-pay | Admitting: Family Medicine

## 2019-03-04 ENCOUNTER — Ambulatory Visit (HOSPITAL_BASED_OUTPATIENT_CLINIC_OR_DEPARTMENT_OTHER)
Admission: RE | Admit: 2019-03-04 | Discharge: 2019-03-04 | Disposition: A | Discharge status: Home / Self Care | Payer: Medicare Other | Source: Ambulatory Visit | Attending: Family Medicine | Admitting: Family Medicine

## 2019-03-04 VITALS — BP 129/88 | HR 93 | Ht 74.0 in | Wt 222.0 lb

## 2019-03-04 DIAGNOSIS — S62353A Nondisplaced fracture of shaft of third metacarpal bone, left hand, initial encounter for closed fracture: Secondary | ICD-10-CM | POA: Diagnosis not present

## 2019-03-04 DIAGNOSIS — S62303A Unspecified fracture of third metacarpal bone, left hand, initial encounter for closed fracture: Secondary | ICD-10-CM | POA: Diagnosis not present

## 2019-03-04 NOTE — Patient Instructions (Signed)
Good to see you Please try tylenol  Please try ice   Please send me a message in MyChart with any questions or updates.  Please see me back in 2 weeks.   --Dr. Raeford Razor

## 2019-03-04 NOTE — Progress Notes (Signed)
Tyrone Roman - 74 y.o. male MRN [id number]  Date of birth: 06/15/1945  SUBJECTIVE:  Including CC & ROS.  Chief Complaint  Patient presents with  . Hand Injury    left hand x 02/25/2019    Tyrone Roman is a 74 y.o. male that is presenting with left hand pain.  He had a fall a little over a week ago.  He was seen in urgent care.  He was diagnosed with a fracture and placed in a splint.  Denies any numbness or tingling.  Independent review of the left hand x-ray from 1/20 shows a nondisplaced oblique third metacarpal fracture.   Review of Systems See HPI   HISTORY: Past Medical, Surgical, Social, and Family History Reviewed & Updated per EMR.   Pertinent Historical Findings include:  Past Medical History:  Diagnosis Date  . Barrett's esophageal ulceration   . BPH (benign prostatic hyperplasia)   . CAD (coronary artery disease)    sees Dr. Mar Daring  . Cataract    both eyes  . Colonic polyp   . Concussion with loss of consciousness of 30 minutes or less   . Contact dermatitis   . Contact with or exposure to venereal diseases   . COPD (chronic obstructive pulmonary disease) ([place])    sees Dr. Kara Mead   . Diverticulosis of colon   . Dysphagia   . GERD (gastroesophageal reflux disease)   . HNP (herniated nucleus pulposus), lumbar    recurrent  . Hyperlipidemia   . Hypertension   . Laceration of scalp   . Myocardial infarction [facility])    2011- stent placed  . Nephrolithiasis   . Pre-operative cardiovascular examination   . Rheumatoid arthritis [facility])    sees Dr. Gavin Pound   . SOB (shortness of breath)     Past Surgical History:  Procedure Laterality Date  . BACK SURGERY    . CARDIAC CATHETERIZATION N/A 01/19/2016   Procedure: Left Heart Cath and Coronary Angiography;  Surgeon: Burnell Blanks, MD;  Location: [place] CV LAB;  Service: Cardiovascular;  Laterality: N/A;  . COLONOSCOPY  12/09/2014   per Dr. Silverio Decamp, adenomatous polyps, repeat 3 years     . CORONARY ANGIOPLASTY WITH STENT PLACEMENT    . ESOPHAGOGASTRODUODENOSCOPY (EGD) WITH ESOPHAGEAL DILATION  02/17/09   Barretts esophagus   . EYE SURGERY    . KNEE ARTHROSCOPY Left X 2  . LUMBAR LAMINECTOMY/DECOMPRESSION MICRODISCECTOMY Right 10/07/2017   Procedure: RIGHT LUMBAR ONE-TWO LAMINECTOMY WITH MICRODISCECTOMY;  Surgeon: Consuella Lose, MD;  Location: [place];  Service: Neurosurgery;  Laterality: Right;  . LUMBAR LAMINECTOMY/DECOMPRESSION MICRODISCECTOMY N/A 12/06/2017   Procedure: RECURRENT MICRODISCECTOMY LUMBAR ONE - LUMBAR TWO;  Surgeon: Consuella Lose, MD;  Location: [place];  Service: Neurosurgery;  Laterality: N/A;  . LUMBAR WOUND DEBRIDEMENT N/A 12/11/2017   Procedure: LUMBAR WOUND DEBRIDEMENT;  Surgeon: Consuella Lose, MD;  Location: [place];  Service: Neurosurgery;  Laterality: N/A;  . TONSILLECTOMY      Allergies  Allergen Reactions  . Cephalexin Shortness Of Breath, Rash and Other (See Comments)    Tolerated Augmentin 2015 and 2017 (12-2017). Tolerated nafcillin 12/2017 PATIENT HAS HAD A PCN REACTION WITH IMMEDIATE RASH, FACIAL/TONGUE/THROAT SWELLING, SOB, OR LIGHTHEADEDNESS WITH HYPOTENSION:  #  #  YES  #  #  Has patient had a PCN reaction causing severe rash involving mucus membranes or skin necrosis: No Has patient had a PCN reaction that required hospitalization: No Has patient had a PCN reaction occurring  within the last 10 years: No If all of the above answers are "NO", then may proceed  . Clarithromycin Shortness Of Breath and Rash  . Certolizumab Pegol Hives  . Doxycycline Rash  . Hydroxyzine Rash  . Lidoderm Other (See Comments)    "made me act weird"  . Pyrithione Zinc Rash    Family History  Problem Relation Age of Onset  . Heart attack Father        cardiovascular disorder  . Arthritis Father        family hx  . Colon cancer Father        mets  . Prostate cancer Father        1st degree relative  . Arthritis Mother   . Diabetes Paternal  Uncle   . Colon cancer Paternal Uncle   . Cancer Maternal Grandmother   . Skin cancer Daughter   . Esophageal cancer Neg Hx   . Stomach cancer Neg Hx   . Rectal cancer Neg Hx      Social History   Socioeconomic History  . Marital status: Divorced    Spouse name: Not on file  . Number of children: 4  . Years of education: Not on file  . Highest education level: Not on file  Occupational History  . Occupation: ENGINEER    Employer: [employer]  Tobacco Use  . Smoking status: Former Smoker    Packs/day: 1.00    Years: 20.00    Pack years: 20.00    Types: Cigarettes    Quit date: 10/12/1988    Years since quitting: 30.4  . Smokeless tobacco: Never Used  . Tobacco comment: 1960's   Substance and Sexual Activity  . Alcohol use: Yes    Alcohol/week: 0.0 standard drinks    Comment: rare  . Drug use: No  . Sexual activity: Not on file  Other Topics Concern  . 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       04/14/2018:   Lives alone, has girlfriend who visits, main source of emotional support   Has been struggling with recent hospitalizations/ill health following sepsis and back surgeries   Social Determinants of Health   Financial Resource Strain: Low Risk   . Difficulty of Paying Living Expenses: Not hard at all  Food Insecurity: No Food Insecurity  . Worried About Charity fundraiser in the Last Year: Never true  . Ran Out of Food in the Last Year: Never true  Transportation Needs: No Transportation Needs  . Lack of Transportation (Medical): No  . Lack of Transportation (Non-Medical): No  Physical Activity:   . Days of Exercise per Week: Not on file  . Minutes of Exercise per Session: Not on file  Stress: Stress Concern Present  . Feeling of Stress : To some extent  Social Connections: Unknown  . Frequency of Communication with Friends and Family: Once a week  . Frequency of  Social Gatherings with Friends and Family: Not on file  . Attends Religious Services: Not on file  . Active Member of Clubs or Organizations: No  . Attends Archivist Meetings: Never  . Marital Status: Not on file  Intimate Partner Violence:   . Fear of Current or Ex-Partner: Not on file  . Emotionally Abused: Not on file  . Physically Abused: Not on file  . Sexually Abused: Not on file  PHYSICAL EXAM:  VS: BP 129/88   Pulse 93   Ht 6\' 2"  (1.88 m)   Wt 222 lb (100.7 kg)   BMI 28.50 kg/m  Physical Exam Gen: NAD, alert, cooperative with exam, well-appearing ENT: normal lips, normal nasal mucosa,  Eye: normal EOM, normal conjunctiva and lids Skin: no rashes, no areas of induration  Neuro: normal tone, normal sensation to touch Psych:  normal insight, alert and oriented MSK:  Left hand: Mild ecchymosis on the dorsal aspect. No malrotation or misalignment. Tenderness to palpation of the third metacarpal. Neurovascularly intact  1. Hand/wrist  2. left 3. Ulnar gutter 4. Ortho glass 5. Applied by self    ASSESSMENT & PLAN:   Closed nondisplaced fracture of shaft of third metacarpal bone of left hand Injury occurred on 1/20.  Was placed in a splint.  Has had improvement of the swelling and pain. -Ulnar gutter splint today. -Counseled on supportive care. -Follow-up in 2 weeks.

## 2019-03-05 NOTE — Assessment & Plan Note (Signed)
Injury occurred on 1/20.  Was placed in a splint.  Has had improvement of the swelling and pain. -Ulnar gutter splint today. -Counseled on supportive care. -Follow-up in 2 weeks.

## 2019-03-09 ENCOUNTER — Other Ambulatory Visit: Payer: Self-pay | Admitting: Family Medicine

## 2019-03-18 ENCOUNTER — Encounter: Payer: Self-pay | Admitting: Family Medicine

## 2019-03-18 ENCOUNTER — Ambulatory Visit (INDEPENDENT_AMBULATORY_CARE_PROVIDER_SITE_OTHER): Payer: Medicare Other | Admitting: Family Medicine

## 2019-03-18 ENCOUNTER — Other Ambulatory Visit: Payer: Self-pay

## 2019-03-18 VITALS — BP 146/89 | HR 92 | Ht 74.0 in | Wt 228.0 lb

## 2019-03-18 DIAGNOSIS — S62353D Nondisplaced fracture of shaft of third metacarpal bone, left hand, subsequent encounter for fracture with routine healing: Secondary | ICD-10-CM | POA: Diagnosis not present

## 2019-03-18 NOTE — Assessment & Plan Note (Signed)
Initial injury was on 1/21.  Has had limited improvement and has some blistering between the fingers and on the palm.  Has limitations with flexion extension still. -Counseled on skin care today. - Counseled on supportive care -Refer to orthopedics.

## 2019-03-18 NOTE — Progress Notes (Signed)
Tyrone Roman - 74 y.o. male MRN [id number]  Date of birth: [birth year]  SUBJECTIVE:  Including CC & ROS.  Chief Complaint  Patient presents with  . Follow-up    follow up for left hand    Tyrone Roman is a 74 y.o. male that is following up for his left hand fracture.  He has ongoing pain and swelling.  His initial injury occurred on 1/20.  He has been in a gutter splint during this time.  He has limited flexion and extension of the fingers.   Review of Systems See HPI   HISTORY: Past Medical, Surgical, Social, and Family History Reviewed & Updated per EMR.   Pertinent Historical Findings include:  Past Medical History:  Diagnosis Date  . Barrett's esophageal ulceration   . BPH (benign prostatic hyperplasia)   . CAD (coronary artery disease)    sees Dr. Mar Daring  . Cataract    both eyes  . Colonic polyp   . Concussion with loss of consciousness of 30 minutes or less   . Contact dermatitis   . Contact with or exposure to venereal diseases   . COPD (chronic obstructive pulmonary disease) ([place])    sees Dr. Kara Mead   . Diverticulosis of colon   . Dysphagia   . GERD (gastroesophageal reflux disease)   . HNP (herniated nucleus pulposus), lumbar    recurrent  . Hyperlipidemia   . Hypertension   . Laceration of scalp   . Myocardial infarction [facility])    2011- stent placed  . Nephrolithiasis   . Pre-operative cardiovascular examination   . Rheumatoid arthritis [facility])    sees Dr. Gavin Pound   . SOB (shortness of breath)     Past Surgical History:  Procedure Laterality Date  . BACK SURGERY    . CARDIAC CATHETERIZATION N/A 01/19/2016   Procedure: Left Heart Cath and Coronary Angiography;  Surgeon: Burnell Blanks, MD;  Location: [place] [facility];  Service: Cardiovascular;  Laterality: N/A;  . COLONOSCOPY  12/09/2014   per Dr. Silverio Decamp, adenomatous polyps, repeat 3 years   . CORONARY ANGIOPLASTY WITH STENT PLACEMENT    . ESOPHAGOGASTRODUODENOSCOPY (EGD) WITH  ESOPHAGEAL DILATION  02/17/09   Barretts esophagus   . EYE SURGERY    . KNEE ARTHROSCOPY Left X 2  . LUMBAR LAMINECTOMY/DECOMPRESSION MICRODISCECTOMY Right 10/07/2017   Procedure: RIGHT LUMBAR ONE-TWO LAMINECTOMY WITH MICRODISCECTOMY;  Surgeon: Consuella Lose, MD;  Location: [place];  Service: Neurosurgery;  Laterality: Right;  . LUMBAR LAMINECTOMY/DECOMPRESSION MICRODISCECTOMY N/A 12/06/2017   Procedure: RECURRENT MICRODISCECTOMY LUMBAR ONE - LUMBAR TWO;  Surgeon: Consuella Lose, MD;  Location: [place];  Service: Neurosurgery;  Laterality: N/A;  . LUMBAR WOUND DEBRIDEMENT N/A 12/11/2017   Procedure: LUMBAR WOUND DEBRIDEMENT;  Surgeon: Consuella Lose, MD;  Location: [place];  Service: Neurosurgery;  Laterality: N/A;  . TONSILLECTOMY      Family History  Problem Relation Age of Onset  . Heart attack Father        cardiovascular disorder  . Arthritis Father        family hx  . Colon cancer Father        mets  . Prostate cancer Father        1st degree relative  . Arthritis Mother   . Diabetes Paternal Uncle   . Colon cancer Paternal Uncle   . Cancer Maternal Grandmother   . Skin cancer Daughter   . Esophageal cancer Neg Hx   . Stomach cancer  Neg Hx   . Rectal cancer Neg Hx     Social History   Socioeconomic History  . Marital status: Divorced    Spouse name: Not on file  . Number of children: 4  . Years of education: Not on file  . Highest education level: Not on file  Occupational History  . Occupation: ENGINEER    Employer: [employer]  Tobacco Use  . Smoking status: Former Smoker    Packs/day: 1.00    Years: 20.00    Pack years: 20.00    Types: Cigarettes    Quit date: 10/12/1988    Years since quitting: 30.4  . Smokeless tobacco: Never Used  . Tobacco comment: 1960's   Substance and Sexual Activity  . Alcohol use: Yes    Alcohol/week: 0.0 standard drinks    Comment: rare  . Drug use: No  . Sexual activity: Not on file  Other Topics Concern  . 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       04/14/2018:   Lives alone, has girlfriend who visits, main source of emotional support   Has been struggling with recent hospitalizations/ill health following sepsis and back surgeries   Social Determinants of Health   Financial Resource Strain: Low Risk   . Difficulty of Paying Living Expenses: Not hard at all  Food Insecurity: No Food Insecurity  . Worried About Charity fundraiser in the Last Year: Never true  . Ran Out of Food in the Last Year: Never true  Transportation Needs: No Transportation Needs  . Lack of Transportation (Medical): No  . Lack of Transportation (Non-Medical): No  Physical Activity:   . Days of Exercise per Week: Not on file  . Minutes of Exercise per Session: Not on file  Stress: Stress Concern Present  . Feeling of Stress : To some extent  Social Connections: Unknown  . Frequency of Communication with Friends and Family: Once a week  . Frequency of Social Gatherings with Friends and Family: Not on file  . Attends Religious Services: Not on file  . Active Member of Clubs or Organizations: No  . Attends Archivist Meetings: Never  . Marital Status: Not on file  Intimate Partner Violence:   . Fear of Current or Ex-Partner: Not on file  . Emotionally Abused: Not on file  . Physically Abused: Not on file  . Sexually Abused: Not on file     PHYSICAL EXAM:  VS: BP (!) 146/89   Pulse 92   Ht 6\' 2"  (1.88 m)   Wt 228 lb (103.4 kg)   BMI 29.27 kg/m  Physical Exam Gen: NAD, alert, cooperative with exam, well-appearing MSK:  Left hand: Index finger has good flexion and extension. No malrotation or misalignment. Limited flexion extension of third fourth and fifth digits. Obvious swelling and ecchymosis still occurring over the dorsal compartment of the hand. Neurovascularly intact     ASSESSMENT & PLAN:    Closed nondisplaced fracture of shaft of third metacarpal bone of left hand Initial injury was on 1/21.  Has had limited improvement and has some blistering between the fingers and on the palm.  Has limitations with flexion extension still. -Counseled on skin care today. - Counseled on supportive care -Refer to orthopedics.

## 2019-03-18 NOTE — Patient Instructions (Signed)
Good to see you Please use ice  Please try to air out the skin every so often  The hand surgeon's office will call you   Please send me a message in [place] with any questions or updates.  Please see [place] back as needed.   --Dr. Raeford Razor

## 2019-03-19 DIAGNOSIS — S62343A Nondisplaced fracture of base of third metacarpal bone, left hand, initial encounter for closed fracture: Secondary | ICD-10-CM | POA: Diagnosis not present

## 2019-03-27 DIAGNOSIS — S62323D Displaced fracture of shaft of third metacarpal bone, left hand, subsequent encounter for fracture with routine healing: Secondary | ICD-10-CM | POA: Diagnosis not present

## 2019-03-31 DIAGNOSIS — M79642 Pain in left hand: Secondary | ICD-10-CM | POA: Diagnosis not present

## 2019-04-03 DIAGNOSIS — E663 Overweight: Secondary | ICD-10-CM | POA: Diagnosis not present

## 2019-04-03 DIAGNOSIS — M2550 Pain in unspecified joint: Secondary | ICD-10-CM | POA: Diagnosis not present

## 2019-04-03 DIAGNOSIS — M0579 Rheumatoid arthritis with rheumatoid factor of multiple sites without organ or systems involvement: Secondary | ICD-10-CM | POA: Diagnosis not present

## 2019-04-03 DIAGNOSIS — Z6829 Body mass index (BMI) 29.0-29.9, adult: Secondary | ICD-10-CM | POA: Diagnosis not present

## 2019-04-03 DIAGNOSIS — Z79899 Other long term (current) drug therapy: Secondary | ICD-10-CM | POA: Diagnosis not present

## 2019-04-03 DIAGNOSIS — M7660 Achilles tendinitis, unspecified leg: Secondary | ICD-10-CM | POA: Diagnosis not present

## 2019-04-09 ENCOUNTER — Ambulatory Visit: Payer: Medicare Other | Attending: Internal Medicine

## 2019-04-09 DIAGNOSIS — Z23 Encounter for immunization: Secondary | ICD-10-CM | POA: Insufficient documentation

## 2019-04-09 NOTE — Progress Notes (Signed)
   Covid-19 Vaccination Clinic  Name:  [name]    MRN: [id number] DOB: [birth year]  04/09/2019  Tyrone Roman was observed post Covid-19 immunization for 15 minutes without incident. He was provided with Vaccine Information Sheet and instruction to access the V-Safe system.   [name] was instructed to call 911 with any severe reactions post vaccine: Marland Kitchen Difficulty breathing  . Swelling of face and throat  . A fast heartbeat  . A bad rash all over body  . Dizziness and weakness

## 2019-04-14 DIAGNOSIS — S62323S Displaced fracture of shaft of third metacarpal bone, left hand, sequela: Secondary | ICD-10-CM | POA: Diagnosis not present

## 2019-04-21 DIAGNOSIS — M79642 Pain in left hand: Secondary | ICD-10-CM | POA: Diagnosis not present

## 2019-04-30 DIAGNOSIS — M79642 Pain in left hand: Secondary | ICD-10-CM | POA: Diagnosis not present

## 2019-05-06 ENCOUNTER — Ambulatory Visit: Payer: Medicare Other | Attending: Internal Medicine

## 2019-05-06 DIAGNOSIS — Z23 Encounter for immunization: Secondary | ICD-10-CM

## 2019-05-06 NOTE — Progress Notes (Signed)
   Covid-19 Vaccination Clinic  Name:  Tyrone Roman    MRN: [id number] DOB: 1945/02/23  05/06/2019  [name] was observed post Covid-19 immunization for 15 minutes without incident. He was provided with Vaccine Information Sheet and instruction to access the V-Safe system.   [name] was instructed to call 911 with any severe reactions post vaccine: Marland Kitchen Difficulty breathing  . Swelling of face and throat  . A fast heartbeat  . A bad rash all over body  . Dizziness and weakness   Immunizations Administered    Name Date Dose VIS Date Route   Pfizer COVID-19 Vaccine 05/06/2019  8:23 AM 0.3 mL 01/16/2019 Intramuscular   Manufacturer: [place]   Lot: [id number]   [place]: [id number]

## 2019-05-07 ENCOUNTER — Other Ambulatory Visit: Payer: Self-pay

## 2019-05-11 ENCOUNTER — Ambulatory Visit (INDEPENDENT_AMBULATORY_CARE_PROVIDER_SITE_OTHER): Payer: Medicare Other | Admitting: Family Medicine

## 2019-05-11 ENCOUNTER — Encounter: Payer: Self-pay | Admitting: Gastroenterology

## 2019-05-11 ENCOUNTER — Other Ambulatory Visit: Payer: Self-pay

## 2019-05-11 ENCOUNTER — Encounter: Payer: Self-pay | Admitting: Family Medicine

## 2019-05-11 VITALS — BP 130/72 | HR 70 | Temp 97.6°F | Ht 74.0 in | Wt 228.6 lb

## 2019-05-11 DIAGNOSIS — R739 Hyperglycemia, unspecified: Secondary | ICD-10-CM | POA: Diagnosis not present

## 2019-05-11 DIAGNOSIS — K219 Gastro-esophageal reflux disease without esophagitis: Secondary | ICD-10-CM

## 2019-05-11 DIAGNOSIS — Z0000 Encounter for general adult medical examination without abnormal findings: Secondary | ICD-10-CM | POA: Diagnosis not present

## 2019-05-11 DIAGNOSIS — E782 Mixed hyperlipidemia: Secondary | ICD-10-CM

## 2019-05-11 DIAGNOSIS — J439 Emphysema, unspecified: Secondary | ICD-10-CM | POA: Diagnosis not present

## 2019-05-11 DIAGNOSIS — R972 Elevated prostate specific antigen [PSA]: Secondary | ICD-10-CM | POA: Diagnosis not present

## 2019-05-11 DIAGNOSIS — M0579 Rheumatoid arthritis with rheumatoid factor of multiple sites without organ or systems involvement: Secondary | ICD-10-CM

## 2019-05-11 DIAGNOSIS — N410 Acute prostatitis: Secondary | ICD-10-CM | POA: Diagnosis not present

## 2019-05-11 DIAGNOSIS — N138 Other obstructive and reflux uropathy: Secondary | ICD-10-CM | POA: Diagnosis not present

## 2019-05-11 DIAGNOSIS — L989 Disorder of the skin and subcutaneous tissue, unspecified: Secondary | ICD-10-CM | POA: Diagnosis not present

## 2019-05-11 DIAGNOSIS — R300 Dysuria: Secondary | ICD-10-CM | POA: Diagnosis not present

## 2019-05-11 DIAGNOSIS — I25118 Atherosclerotic heart disease of native coronary artery with other forms of angina pectoris: Secondary | ICD-10-CM | POA: Diagnosis not present

## 2019-05-11 DIAGNOSIS — N401 Enlarged prostate with lower urinary tract symptoms: Secondary | ICD-10-CM | POA: Diagnosis not present

## 2019-05-11 LAB — CBC WITH DIFFERENTIAL/PLATELET
Basophils Absolute: 0.1 10*3/uL (ref 0.0–0.1)
Basophils Relative: 1.5 % (ref 0.0–3.0)
Eosinophils Absolute: 0.5 10*3/uL (ref 0.0–0.7)
Eosinophils Relative: 8.1 % — ABNORMAL HIGH (ref 0.0–5.0)
HCT: 42.1 % (ref 39.0–52.0)
Hemoglobin: 14 g/dL (ref 13.0–17.0)
Lymphocytes Relative: 24.3 % (ref 12.0–46.0)
Lymphs Abs: 1.6 10*3/uL (ref 0.7–4.0)
MCHC: 33.3 g/dL (ref 30.0–36.0)
MCV: 89.6 fl (ref 78.0–100.0)
Monocytes Absolute: 0.8 10*3/uL (ref 0.1–1.0)
Monocytes Relative: 11.7 % (ref 3.0–12.0)
Neutro Abs: 3.6 10*3/uL (ref 1.4–7.7)
Neutrophils Relative %: 54.4 % (ref 43.0–77.0)
Platelets: 177 10*3/uL (ref 150.0–400.0)
RBC: 4.7 Mil/uL (ref 4.22–5.81)
RDW: 14.9 % (ref 11.5–15.5)
WBC: 6.6 10*3/uL (ref 4.0–10.5)

## 2019-05-11 LAB — POCT URINALYSIS DIPSTICK
Bilirubin, UA: NEGATIVE
Blood, UA: NEGATIVE
Glucose, UA: NEGATIVE
Ketones, UA: NEGATIVE
Leukocytes, UA: NEGATIVE
Nitrite, UA: NEGATIVE
Protein, UA: NEGATIVE
Spec Grav, UA: 1.02 (ref 1.010–1.025)
Urobilinogen, UA: 0.2 E.U./dL
pH, UA: 6 (ref 5.0–8.0)

## 2019-05-11 LAB — LIPID PANEL
Cholesterol: 158 mg/dL (ref 0–200)
HDL: 47.7 mg/dL (ref >39.00)
LDL Cholesterol: 83 mg/dL (ref 0–99)
NonHDL: 110.65
Total CHOL/HDL Ratio: 3
Triglycerides: 139 mg/dL (ref 0.0–149.0)
VLDL: 27.8 mg/dL (ref 0.0–40.0)

## 2019-05-11 LAB — PSA: PSA: 4.59 ng/mL — ABNORMAL HIGH (ref 0.10–4.00)

## 2019-05-11 LAB — HEPATIC FUNCTION PANEL
ALT: 17 U/L (ref 0–53)
AST: 22 U/L (ref 0–37)
Albumin: 4 g/dL (ref 3.5–5.2)
Alkaline Phosphatase: 52 U/L (ref 39–117)
Bilirubin, Direct: 0.1 mg/dL (ref 0.0–0.3)
Total Bilirubin: 0.5 mg/dL (ref 0.2–1.2)
Total Protein: 6.5 g/dL (ref 6.0–8.3)

## 2019-05-11 LAB — BASIC METABOLIC PANEL
BUN: 20 mg/dL (ref 6–23)
CO2: 29 mEq/L (ref 19–32)
Calcium: 10.1 mg/dL (ref 8.4–10.5)
Chloride: 104 mEq/L (ref 96–112)
Creatinine, Ser: 1.21 mg/dL (ref 0.40–1.50)
GFR: 58.63 mL/min — ABNORMAL LOW (ref >60.00)
Glucose, Bld: 101 mg/dL — ABNORMAL HIGH (ref 70–99)
Potassium: 4.1 mEq/L (ref 3.5–5.1)
Sodium: 141 mEq/L (ref 135–145)

## 2019-05-11 LAB — HEMOGLOBIN A1C: Hgb A1c MFr Bld: 6 % (ref 4.6–6.5)

## 2019-05-11 LAB — TSH: TSH: 1.79 u[IU]/mL (ref 0.35–4.50)

## 2019-05-11 MED ORDER — METOPROLOL SUCCINATE ER 25 MG PO TB24: 25.0000 mg | ORAL_TABLET | Freq: Every day | ORAL | 3 refills | Status: DC

## 2019-05-11 MED ORDER — CIPROFLOXACIN HCL 500 MG PO TABS: 500.0000 mg | ORAL_TABLET | Freq: Two times a day (BID) | ORAL | 0 refills | Status: DC

## 2019-05-11 MED ORDER — OMEPRAZOLE 40 MG PO CPDR: 40.0000 mg | DELAYED_RELEASE_CAPSULE | Freq: Every day | ORAL | 3 refills | Status: DC

## 2019-05-11 MED ORDER — TAMSULOSIN HCL 0.4 MG PO CAPS: 0.4000 mg | ORAL_CAPSULE | Freq: Every day | ORAL | 3 refills | Status: DC

## 2019-05-11 MED ORDER — POTASSIUM CHLORIDE CRYS ER 20 MEQ PO TBCR: 20.0000 meq | EXTENDED_RELEASE_TABLET | Freq: Every day | ORAL | 3 refills | Status: DC

## 2019-05-11 NOTE — Progress Notes (Signed)
Subjective:    Patient ID: Tyrone Roman, male    DOB: [birth year], 74 y.o.   MRN: [id number]  HPI Here to follow up on issues and to complain of 3 weeks of urinary frequency and urgency. No burning or pain. He has a hx of recurrent prostate infections. He sees Dr. Lenna Gilford for RA and this has been stable. His GERD is stable. He has been seeing Dr. Clearance [name] for a left hand fracture, and he has referred him to Hand Surgery for this.    Review of Systems  Constitutional: Negative.   HENT: Negative.   Eyes: Negative.   Respiratory: Negative.   Cardiovascular: Negative.   Gastrointestinal: Negative.   Genitourinary: Positive for frequency and urgency. Negative for dysuria, flank pain and hematuria.  Musculoskeletal: Positive for arthralgias.  Skin: Negative.   Neurological: Negative.   Psychiatric/Behavioral: Negative.        Objective:   Physical Exam Constitutional:      General: He is not in acute distress.    Appearance: He is well-developed. He is not diaphoretic.  HENT:     Head: Normocephalic and atraumatic.     Right Ear: External ear normal.     Left Ear: External ear normal.     Nose: Nose normal.     Mouth/Throat:     Pharynx: No oropharyngeal exudate.  Eyes:     General: No scleral icterus.       Right eye: No discharge.        Left eye: No discharge.     Conjunctiva/sclera: Conjunctivae normal.     Pupils: Pupils are equal, round, and reactive to light.  Neck:     Thyroid: No thyromegaly.     Vascular: No JVD.     Trachea: No tracheal deviation.  Cardiovascular:     Rate and Rhythm: Normal rate and regular rhythm.     Heart sounds: Normal heart sounds. No murmur. No friction rub. No gallop.   Pulmonary:     Effort: Pulmonary effort is normal. No respiratory distress.     Breath sounds: Normal breath sounds. No wheezing or rales.  Chest:     Chest wall: No tenderness.  Abdominal:     General: Bowel sounds are normal. There is no distension.   Palpations: Abdomen is soft. There is no mass.     Tenderness: There is no abdominal tenderness. There is no guarding or rebound.  Genitourinary:    Penis: Normal. No tenderness.      Testes: Normal.     Rectum: Normal. Guaiac result negative.     Comments: Prostate is mildly swollen and tender  Musculoskeletal:        General: No tenderness. Normal range of motion.     Cervical back: Neck supple.  Lymphadenopathy:     Cervical: No cervical adenopathy.  Skin:    General: Skin is warm and dry.     Coloration: Skin is not pale.     Findings: No erythema or rash.  Neurological:     Mental Status: He is alert and oriented to person, place, and time.     Cranial Nerves: No cranial nerve deficit.     Motor: No abnormal muscle tone.     Coordination: Coordination normal.     Deep Tendon Reflexes: Reflexes are normal and symmetric. Reflexes normal.  Psychiatric:        Behavior: Behavior normal.        Thought Content: Thought content normal.  Judgment: Judgment normal.           Assessment & Plan:  His GERD and RA are stable. We will send him for fasting labs to check lipids and an A1c. He has another prostatitis, so we will treat this with Cipro. heis past due for a colonoscopy, so we will arrange for this. Refer to Dermatology for a skin check. [name], MD

## 2019-05-12 DIAGNOSIS — S62323D Displaced fracture of shaft of third metacarpal bone, left hand, subsequent encounter for fracture with routine healing: Secondary | ICD-10-CM | POA: Diagnosis not present

## 2019-05-12 NOTE — Addendum Note (Signed)
Addended by: [name] A on: 05/12/2019 04:48 PM   Modules accepted: Orders

## 2019-05-25 DIAGNOSIS — H35033 Hypertensive retinopathy, bilateral: Secondary | ICD-10-CM | POA: Diagnosis not present

## 2019-05-25 DIAGNOSIS — H35013 Changes in retinal vascular appearance, bilateral: Secondary | ICD-10-CM | POA: Diagnosis not present

## 2019-05-25 DIAGNOSIS — Z961 Presence of intraocular lens: Secondary | ICD-10-CM | POA: Diagnosis not present

## 2019-05-25 DIAGNOSIS — H353132 Nonexudative age-related macular degeneration, bilateral, intermediate dry stage: Secondary | ICD-10-CM | POA: Diagnosis not present

## 2019-05-26 ENCOUNTER — Telehealth: Payer: Self-pay | Admitting: Family Medicine

## 2019-05-26 DIAGNOSIS — J439 Emphysema, unspecified: Secondary | ICD-10-CM

## 2019-05-26 DIAGNOSIS — E782 Mixed hyperlipidemia: Secondary | ICD-10-CM

## 2019-05-26 NOTE — Chronic Care Management (AMB) (Signed)
  Chronic Care Management   Note  05/26/2019 Name: Tyrone Roman MRN: [id number] DOB: 11-Apr-1945  Tyrone Roman is a 74 y.o. year old male who is a primary care patient of [name], MD. I reached out to [name] by phone today in response to a referral sent by Tyrone Roman's PCP, [name], MD.   Tyrone Roman was given information about Chronic Care Management services today including:  1. CCM service includes personalized support from designated clinical staff supervised by his physician, including individualized plan of care and coordination with other care providers 2. 24/7 contact phone numbers for assistance for urgent and routine care needs. 3. Service will only be billed when office clinical staff spend 20 minutes or more in a month to coordinate care. 4. Only one practitioner may furnish and bill the service in a calendar month. 5. The patient may stop CCM services at any time (effective at the end of the month) by phone call to the office staff.   Patient agreed to services and verbal consent obtained.   Follow up plan:   Raynicia Dukes [employer] Scheduler

## 2019-06-03 DIAGNOSIS — M0579 Rheumatoid arthritis with rheumatoid factor of multiple sites without organ or systems involvement: Secondary | ICD-10-CM | POA: Diagnosis not present

## 2019-06-04 ENCOUNTER — Ambulatory Visit: Payer: Medicare Other | Admitting: *Deleted

## 2019-06-04 ENCOUNTER — Other Ambulatory Visit: Payer: Self-pay

## 2019-06-04 VITALS — Temp 97.3°F | Ht 74.0 in | Wt 230.0 lb

## 2019-06-04 DIAGNOSIS — Z86010 Personal history of colonic polyps: Secondary | ICD-10-CM

## 2019-06-04 NOTE — Progress Notes (Signed)
Completed covid vaccines 05-06-2019  No egg or soy allergy known to patient  No issues with past sedation with any surgeries  or procedures, no intubation problems  No diet pills per patient No home 02 use per patient  No blood thinners per patient  Pt denies issues with constipation  No A fib or A flutter  EMMI video sent to pt's e mail   Due to the COVID-19 pandemic we are asking patients to follow these guidelines. Please only bring one care partner. Please be aware that your care partner may wait in the car in the parking lot or if they feel like they will be too hot to wait in the car, they may wait in the lobby on the 4th floor. All care partners are required to wear a mask the entire time (we do not have any that we can provide them), they need to practice social distancing, and we will do a Covid check for all patient's and care partners when you arrive. Also we will check their temperature and your temperature. If the care partner waits in their car they need to stay in the parking lot the entire time and we will call them on their cell phone when the patient is ready for discharge so they can bring the car to the front of the building. Also all patient's will need to wear a mask into building.

## 2019-06-09 DIAGNOSIS — S62323S Displaced fracture of shaft of third metacarpal bone, left hand, sequela: Secondary | ICD-10-CM | POA: Diagnosis not present

## 2019-06-17 ENCOUNTER — Ambulatory Visit (AMBULATORY_SURGERY_CENTER): Payer: Medicare Other | Admitting: Gastroenterology

## 2019-06-17 ENCOUNTER — Encounter: Payer: Self-pay | Admitting: Gastroenterology

## 2019-06-17 ENCOUNTER — Other Ambulatory Visit: Payer: Self-pay

## 2019-06-17 VITALS — BP 128/90 | HR 71 | Temp 97.1°F | Resp 15 | Ht 74.0 in | Wt 230.0 lb

## 2019-06-17 DIAGNOSIS — Z800 Family history of malignant neoplasm of digestive organs: Secondary | ICD-10-CM | POA: Diagnosis not present

## 2019-06-17 DIAGNOSIS — D123 Benign neoplasm of transverse colon: Secondary | ICD-10-CM | POA: Diagnosis not present

## 2019-06-17 DIAGNOSIS — Z86010 Personal history of colonic polyps: Secondary | ICD-10-CM

## 2019-06-17 MED ORDER — SODIUM CHLORIDE 0.9 % IV SOLN: 500.0000 mL | Freq: Once | INTRAVENOUS | Status: DC

## 2019-06-17 NOTE — Op Note (Addendum)
[facility] Patient Name: Tyrone Roman Procedure Date: 06/17/2019 10:29 AM MRN: [id number] Endoscopist: Mauri Pole , MD Age: 74 Referring MD:  Date of Birth: 1945-02-24 Gender: Male Account #: [id number] Procedure:                Colonoscopy Indications:              Screening in patient at increased risk: Family                            history of 1st-degree relative with colorectal                            cancer, High risk colon cancer surveillance:                            Personal history of multiple (3 or more) adenomas Medicines:                Monitored Anesthesia Care Procedure:                Pre-Anesthesia Assessment:                           - Prior to the procedure, a History and Physical                            was performed, and patient medications and                            allergies were reviewed. The patient's tolerance of                            previous anesthesia was also reviewed. The risks                            and benefits of the procedure and the sedation                            options and risks were discussed with the patient.                            All questions were answered, and informed consent                            was obtained. Prior Anticoagulants: The patient has                            taken no previous anticoagulant or antiplatelet                            agents. ASA Grade Assessment: III - A patient with                            severe systemic disease. After reviewing the risks  and benefits, the patient was deemed in                            satisfactory condition to undergo the procedure.                           After obtaining informed consent, the colonoscope                            was passed under direct vision. Throughout the                            procedure, the patient's blood pressure, pulse, and                            oxygen  saturations were monitored continuously. The                            Colonoscope was introduced through the anus and                            advanced to the the cecum, identified by                            appendiceal orifice and ileocecal valve. The                            colonoscopy was performed without difficulty. The                            patient tolerated the procedure well. The quality                            of the bowel preparation was good. The ileocecal                            valve, appendiceal orifice, and rectum were                            photographed. Scope In: 10:38:35 AM Scope Out: 11:02:31 AM Scope Withdrawal Time: 0 hours 16 minutes 26 seconds  Total Procedure Duration: 0 hours 23 minutes 56 seconds  Findings:                 The perianal and digital rectal examinations were                            normal.                           A 7 mm polyp was found in the transverse colon. The                            polyp was sessile. The polyp was removed with a  cold snare. Resection and retrieval were complete.                           Two sessile polyps were found in the transverse                            colon. The polyps were 1 to 3 mm in size. These                            polyps were removed with a cold biopsy forceps.                            Resection and retrieval were complete.                           Scattered small and large-mouthed diverticula were                            found in the sigmoid colon, descending colon,                            transverse colon and ascending colon.                           Non-bleeding internal hemorrhoids were found during                            retroflexion. The hemorrhoids were small. Complications:            No immediate complications. Estimated Blood Loss:     Estimated blood loss was minimal. Impression:               - One 7 mm polyp in the  transverse colon, removed                            with a cold snare. Resected and retrieved.                           - Two 1 to 3 mm polyps in the transverse colon,                            removed with a cold biopsy forceps. Resected and                            retrieved.                           - Moderate diverticulosis in the sigmoid colon, in                            the descending colon, in the transverse colon and                            in the ascending colon.                           -  Non-bleeding internal hemorrhoids. Recommendation:           - Patient has a contact number available for                            emergencies. The signs and symptoms of potential                            delayed complications were discussed with the                            patient. Return to normal activities tomorrow.                            Written discharge instructions were provided to the                            patient.                           - Resume previous diet.                           - Continue present medications.                           - Await pathology results.                           - Repeat colonoscopy in 3 - 5 years for                            surveillance based on pathology results. Mauri Pole, MD 06/17/2019 11:10:53 AM This report has been signed electronically.

## 2019-06-17 NOTE — Progress Notes (Signed)
Temperature taken by L.C., VS taken by C.W. 

## 2019-06-17 NOTE — Patient Instructions (Signed)
Handouts given for diverticulosis and polyps.  YOU HAD AN ENDOSCOPIC PROCEDURE TODAY AT THE [facility]:   Refer to the procedure report that was given to you for any specific questions about what was found during the examination.  If the procedure report does not answer your questions, please call your gastroenterologist to clarify.  If you requested that your care partner not be given the details of your procedure findings, then the procedure report has been included in a sealed envelope for you to review at your convenience later.  YOU SHOULD EXPECT: Some feelings of bloating in the abdomen. Passage of more gas than usual.  Walking can help get rid of the air that was put into your GI tract during the procedure and reduce the bloating. If you had a lower endoscopy (such as a colonoscopy or flexible sigmoidoscopy) you may notice spotting of blood in your stool or on the toilet paper. If you underwent a bowel prep for your procedure, you may not have a normal bowel movement for a few days.  Please Note:  You might notice some irritation and congestion in your nose or some drainage.  This is from the oxygen used during your procedure.  There is no need for concern and it should clear up in a day or so.  SYMPTOMS TO REPORT IMMEDIATELY:  Following lower endoscopy (colonoscopy or flexible sigmoidoscopy):  Excessive amounts of blood in the stool  Significant tenderness or worsening of abdominal pains  Swelling of the abdomen that is new, acute  Fever of 100F or higher  For urgent or emergent issues, a gastroenterologist can be reached at any hour by calling [phone]. Do not use MyChart messaging for urgent concerns.    DIET:  We do recommend a small meal at first, but then you may proceed to your regular diet.  Drink plenty of fluids but you should avoid alcoholic beverages for 24 hours.  ACTIVITY:  You should plan to take it easy for the rest of today and you should NOT DRIVE  or use heavy machinery until tomorrow (because of the sedation medicines used during the test).    FOLLOW UP: Our staff will call the number listed on your records 48-72 hours following your procedure to check on you and address any questions or concerns that you may have regarding the information given to you following your procedure. If we do not reach you, we will leave a message.  We will attempt to reach you two times.  During this call, we will ask if you have developed any symptoms of COVID 19. If you develop any symptoms (ie: fever, flu-like symptoms, shortness of breath, cough etc.) before then, please call [phone].  If you test positive for Covid 19 in the 2 weeks post procedure, please call and report this information to us.    If any biopsies were taken you will be contacted by phone or by letter within the next 1-3 weeks.  Please call us at [phone] if you have not heard about the biopsies in 3 weeks.    SIGNATURES/CONFIDENTIALITY: You and/or your care partner have signed paperwork which will be entered into your electronic medical record.  These signatures attest to the fact that that the information above on your After Visit Summary has been reviewed and is understood.  Full responsibility of the confidentiality of this discharge information lies with you and/or your care-partner.  

## 2019-06-17 NOTE — Progress Notes (Signed)
pt tolerated well. VSS. awake and to recovery. Report given to RN.  

## 2019-06-17 NOTE — Progress Notes (Signed)
Pt's states no medical or surgical changes since previsit or office visit. 

## 2019-06-17 NOTE — Progress Notes (Signed)
Called to room to assist during endoscopic procedure.  Patient ID and intended procedure confirmed with present staff. Received instructions for my participation in the procedure from the performing physician.  

## 2019-06-19 ENCOUNTER — Telehealth: Payer: Self-pay

## 2019-06-19 NOTE — Telephone Encounter (Signed)
  Follow up Call-  Call back number 06/17/2019  Post procedure Call Back phone  # [phone]  Permission to leave phone message Yes  Some recent data might be hidden     Patient questions:  Do you have a fever, pain , or abdominal swelling? No. Pain Score  0 *  Have you tolerated food without any problems? Yes.    Have you been able to return to your normal activities? Yes.    Do you have any questions about your discharge instructions: Diet   No. Medications  No. Follow up visit  No.  Do you have questions or concerns about your Care? No.  Actions: * If pain score is 4 or above: No action needed, pain <4.  1. Have you developed a fever since your procedure? no  2.   Have you had an respiratory symptoms (SOB or cough) since your procedure? no  3.   Have you tested positive for COVID 19 since your procedure no  4.   Have you had any family members/close contacts diagnosed with the COVID 19 since your procedure?  no   If yes to any of these questions please route to Joylene Evelyn, RN and Erenest Rasher, RN

## 2019-06-22 ENCOUNTER — Telehealth: Payer: Self-pay | Admitting: Family Medicine

## 2019-06-22 DIAGNOSIS — M0579 Rheumatoid arthritis with rheumatoid factor of multiple sites without organ or systems involvement: Secondary | ICD-10-CM | POA: Diagnosis not present

## 2019-06-22 NOTE — Telephone Encounter (Signed)
Pt would like to have his lab results from 05/11/2019 stated that he never got them.

## 2019-06-23 DIAGNOSIS — Z79899 Other long term (current) drug therapy: Secondary | ICD-10-CM | POA: Diagnosis not present

## 2019-06-23 DIAGNOSIS — M7660 Achilles tendinitis, unspecified leg: Secondary | ICD-10-CM | POA: Diagnosis not present

## 2019-06-23 DIAGNOSIS — M2550 Pain in unspecified joint: Secondary | ICD-10-CM | POA: Diagnosis not present

## 2019-06-23 DIAGNOSIS — E669 Obesity, unspecified: Secondary | ICD-10-CM | POA: Diagnosis not present

## 2019-06-23 DIAGNOSIS — M0579 Rheumatoid arthritis with rheumatoid factor of multiple sites without organ or systems involvement: Secondary | ICD-10-CM | POA: Diagnosis not present

## 2019-06-23 DIAGNOSIS — Z6830 Body mass index (BMI) 30.0-30.9, adult: Secondary | ICD-10-CM | POA: Diagnosis not present

## 2019-06-23 NOTE — Telephone Encounter (Signed)
Done

## 2019-06-23 NOTE — Addendum Note (Signed)
Addended by: [name] A on: 06/23/2019 08:54 AM   Modules accepted: Orders

## 2019-06-23 NOTE — Telephone Encounter (Signed)
Discussed results with patient, patient expressed understanding. Nothing further needed. ° °

## 2019-06-25 ENCOUNTER — Other Ambulatory Visit: Payer: Self-pay

## 2019-06-25 ENCOUNTER — Encounter: Payer: Self-pay | Admitting: Gastroenterology

## 2019-06-25 ENCOUNTER — Ambulatory Visit: Payer: Medicare Other

## 2019-06-25 DIAGNOSIS — K219 Gastro-esophageal reflux disease without esophagitis: Secondary | ICD-10-CM

## 2019-06-25 DIAGNOSIS — N401 Enlarged prostate with lower urinary tract symptoms: Secondary | ICD-10-CM

## 2019-06-25 DIAGNOSIS — N138 Other obstructive and reflux uropathy: Secondary | ICD-10-CM

## 2019-06-25 DIAGNOSIS — M0579 Rheumatoid arthritis with rheumatoid factor of multiple sites without organ or systems involvement: Secondary | ICD-10-CM

## 2019-06-25 DIAGNOSIS — E782 Mixed hyperlipidemia: Secondary | ICD-10-CM

## 2019-06-25 DIAGNOSIS — I25118 Atherosclerotic heart disease of native coronary artery with other forms of angina pectoris: Secondary | ICD-10-CM

## 2019-06-25 NOTE — Chronic Care Management (AMB) (Signed)
Chronic Care Management Pharmacy  Name: Tyrone Roman  MRN: [id number] DOB: 10-18-1945   Initial Questions: 1. Have you seen any other providers since your last visit? NA 2. Any changes in your medicines or health? No   Chief Complaint/ HPI  Tyrone Roman,  74 y.o. , male presents for their Initial CCM visit with the clinical pharmacist via telephone due to COVID-19 Pandemic.  PCP : [name], MD  Their chronic conditions include: HLD, CAD, RA, GERD/ Barretts esophagus, BPH  Office Visits: 05/11/2019- [name], MD- Patient presented for office visit for complaint of 3 weeks of urinary frequency and urgency. Patient has recurrent prostate infections. Patient to obtain fasting labs (lipids and A1c). Patient treated with Cipro for another prostatitis. Patient is past due for colonoscopy. Patient referred to dermatology for a skin check.   Consult Visit: 03/18/2019- Sports Medicine- Clearance [name], MD- Patient had office visit for fracture of left hand. Initial injury on 1/21. Limited improvement and some blistering between fingers and palm. Patient counseled on skin care and referred to orthopedics.   Medications: Outpatient Encounter Medications as of 06/25/2019  Medication Sig  . acetaminophen (TYLENOL) 325 MG tablet Take 2 tablets (650 mg total) by mouth every 6 (six) hours as needed for mild pain, fever or headache.  . Biotin 1 MG CAPS biotin  . leflunomide (ARAVA) 20 MG tablet Take 20 mg by mouth daily.  . metoprolol succinate (TOPROL-XL) 25 MG 24 hr tablet Take 1 tablet (25 mg total) by mouth daily.  Marland Kitchen omeprazole (PRILOSEC) 40 MG capsule Take 1 capsule (40 mg total) by mouth daily.  . potassium chloride SA (KLOR-CON M20) 20 MEQ tablet Take 1 tablet (20 mEq total) by mouth daily.  . predniSONE (DELTASONE) 10 MG tablet Take 1 tablet (10 mg total) by mouth daily with breakfast. (Patient taking differently: Take 5 mg by mouth daily with breakfast. )  . riTUXimab (RITUXAN) 500  MG/50ML injection Inject into the vein. Every 4 months  . simvastatin (ZOCOR) 40 MG tablet TAKE ONE TABLET BY MOUTH DAILY  . tamsulosin (FLOMAX) 0.4 MG CAPS capsule Take 1 capsule (0.4 mg total) by mouth daily.  Marland Kitchen aspirin EC 81 MG tablet Take 1 tablet (81 mg total) by mouth daily. (Patient not taking: Reported on 06/25/2019)  . cetirizine (ZYRTEC) 5 MG chewable tablet as needed.   . ciprofloxacin (CIPRO) 500 MG tablet Take 1 tablet (500 mg total) by mouth 2 (two) times daily. (Patient not taking: Reported on 06/25/2019)  . ibuprofen (ADVIL) 200 MG tablet Take 800 mg by mouth every 6 (six) hours as needed.  Marland Kitchen MAGNESIUM PO Take by mouth.  . nitroGLYCERIN (NITROSTAT) 0.4 MG SL tablet Place 1 tablet (0.4 mg total) under the tongue every 5 (five) minutes as needed for chest pain (3 doses max). (Patient not taking: Reported on 06/25/2019)  . sildenafil (REVATIO) 20 MG tablet TAKE ONE TABLET BY MOUTH DAILY AS NEEDED (Patient not taking: Reported on 06/25/2019)  . traMADol (ULTRAM) 50 MG tablet Take 1-2 tablets (50-100 mg total) by mouth 3 (three) times daily as needed. (Patient not taking: Reported on 06/25/2019)  . zolpidem (AMBIEN) 10 MG tablet TAKE ONE TABLET BY MOUTH EVERY NIGHT AT BEDTIME (Patient not taking: Reported on 06/17/2019)   No facility-administered encounter medications on file as of 06/25/2019.     Current Diagnosis/Assessment:  Goals Addressed            This Visit's Progress   . Pharmacy Care  Plan       CARE PLAN ENTRY  Current Barriers:  . Chronic Disease Management support, education, and care coordination needs related to Hyperlipidemia, Coronary Artery Disease, GERD, and BPH, rheumatoid arthritis  Hyperlipidemia . Pharmacist Clinical Goal(s): o Over the next 180 days, patient will work with PharmD and providers to maintain LDL goal < 70 . Current regimen:  o Simvastatin 40mg , 1 tablet once daily . Interventions: . We discussed how a diet high in plant sterols  (fruits/vegetables/nuts/whole grains/legumes) may reduce your cholesterol.  Encouraged increasing fiber to a daily intake of 10-25g/day  . Patient self care activities - Over the next 180 days, patient will: o Continue current medication  Coronary artery disease . Pharmacist Clinical Goal(s) o Over the next 180 days, patient will work with PharmD and providers to continue current medications . Current regimen:   Metoprolol succinate 25mg , 1 tablet once daily  Nitroglycerin 0.4mg  SL tablet, 1 tablet under tongue every five mintues as needed for chest pain (3 doses max) . Patient self care activities o Patient will continue current medications.   Rheumatoid arthritis . Pharmacist Clinical Goal(s) o Over the next 180 days, patient will work with PharmD and providers to continue current medications.  . Current regimen:   leflunomide (Arava) 20mg , 1 tablet once daily   Prednisone 10mg , 1 tablet daily with breakfast (only takes as needed)  Rituxan infusion every 4 month . Patient self care activities o Patient will continue current medications   GERD . Pharmacist Clinical Goal(s) o Over the next 180 days, patient will work with PharmD and providers to Minimize reflux symptoms.  . Current regimen:  o Omeprazole 40mg ,1 capsule once daily  . Patient self care activities o Patient will continue current medications.   BPH . Pharmacist Clinical Goal(s) o Over the next 180 days, patient will work with PharmD and providers to minimize symptoms associated with enlarged prostate . Current regimen:  o tamsulosin 0.4mg ,1 capsule once daily  . Patient self care activities o Patient will continue current medication.   Medication management . Pharmacist Clinical Goal(s): o Over the next 180 days, patient will work with PharmD and providers to maintain optimal medication adherence . [facility]   . Interventions o Comprehensive medication review performed. o Continue  current medication management strategy . Patient self care activities - Over the next 180 days, patient will: o Take medications as prescribed o Report any questions or concerns to PharmD and/or provider(s)  Initial goal documentation       SDOH Interventions     Most Recent Value  SDOH Interventions  Financial Strain Interventions  Intervention Not Indicated  Transportation Interventions  Intervention Not Indicated      Hyperlipidemia   Lipid Panel     Component Value Date/Time   CHOL 158 05/11/2019 1334   TRIG 139.0 05/11/2019 1334   HDL 47.70 05/11/2019 1334   CHOLHDL 3 05/11/2019 1334   VLDL 27.8 05/11/2019 1334   LDLCALC 83 05/11/2019 1334    The ASCVD Risk score (Goff DC Jr., et al., 2013) failed to calculate for the following reasons:   The patient has a prior MI or stroke diagnosis   Patient has failed these meds in past: none  Patient is currently controlled on the following medications:   Simvastatin 40mg , 1 tablet once daily   We discussed:  diet and exercise extensively  Plan Continue current medications  CAD   Patient reported never needing to use nitroglycerin. Denies  chest pain.   Patient has failed these meds in past: none Patient is currently controlled on the following medications:   Metoprolol succinate 25mg , 1 tablet once daily  Nitroglycerin 0.4mg  SL tablet, 1 tablet under tongue every five mintues as needed for chest pain (3 doses max)  We discussed:   diet: changes all the time. Tries to watch what he eats, keeping low carbs and protein .  Plan Continue current medications   Rheumatoid arthritis    Patient is currently controlled on the following medications:   leflunomide (Arava) 20mg , 1 tablet once daily   Prednisone 10mg , 1 tablet daily with breakfast (only takes as needed)  Rituxan infusion every 4 months.   States med changes recently.   Plan Continue current medications   GERD/ Barrett's esophagus   Patient is  currently controlled on the following medications:   Omeprazole 40mg ,1 capsule once daily   Plan Continue current medications  BPH   Patient is currently controlled on the following medications:   tamsulosin 0.4mg ,1 capsule once daily   Plan Continue current medications   OTC/ supplements     APAP 325mg , 2 tablets every six hours as needed for mild pain, fever or headache   Biotin 1mg   Cetrizine 5mg , 1 tablet as needed   Medication Management  Patient organizes medications: weekly pack.  [facility]   Adherence: query   Follow up Follow up visit with PharmD in  6 months    Anson Crofts, PharmD Clinical Pharmacist [facility] [place] [phone]

## 2019-07-06 NOTE — Patient Instructions (Addendum)
Visit Information  Goals Addressed            This Visit's Progress   . Pharmacy Care Plan       CARE PLAN ENTRY  Current Barriers:  . Chronic Disease Management support, education, and care coordination needs related to Hyperlipidemia, Coronary Artery Disease, GERD, and BPH, rheumatoid arthritis  Hyperlipidemia . Pharmacist Clinical Goal(s): o Over the next 180 days, patient will work with PharmD and providers to maintain LDL goal < 70 . Current regimen:  o Simvastatin 40mg , 1 tablet once daily . Interventions: . We discussed how a diet high in plant sterols (fruits/vegetables/nuts/whole grains/legumes) may reduce your cholesterol.  Encouraged increasing fiber to a daily intake of 10-25g/day  . Patient self care activities - Over the next 180 days, patient will: o Continue current medication  Coronary artery disease . Pharmacist Clinical Goal(s) o Over the next 180 days, patient will work with PharmD and providers to continue current medications . Current regimen:   Metoprolol succinate 25mg , 1 tablet once daily  Nitroglycerin 0.4mg  SL tablet, 1 tablet under tongue every five mintues as needed for chest pain (3 doses max) . Patient self care activities o Patient will continue current medications.   Rheumatoid arthritis . Pharmacist Clinical Goal(s) o Over the next 180 days, patient will work with PharmD and providers to continue current medications.  . Current regimen:   leflunomide (Arava) 20mg , 1 tablet once daily   Prednisone 10mg , 1 tablet daily with breakfast (only takes as needed)  Rituxan infusion every 4 month . Patient self care activities o Patient will continue current medications   GERD . Pharmacist Clinical Goal(s) o Over the next 180 days, patient will work with PharmD and providers to Minimize reflux symptoms.  . Current regimen:  o Omeprazole 40mg ,1 capsule once daily  . Patient self care activities o Patient will continue current medications.    BPH . Pharmacist Clinical Goal(s) o Over the next 180 days, patient will work with PharmD and providers to minimize symptoms associated with enlarged prostate . Current regimen:  o tamsulosin 0.4mg ,1 capsule once daily  . Patient self care activities o Patient will continue current medication.   Medication management . Pharmacist Clinical Goal(s): o Over the next 180 days, patient will work with PharmD and providers to maintain optimal medication adherence . [facility]   . Interventions o Comprehensive medication review performed. o Continue current medication management strategy . Patient self care activities - Over the next 180 days, patient will: o Take medications as prescribed o Report any questions or concerns to PharmD and/or provider(s)  Initial goal documentation        Mr. Wittkopp was given information about Chronic Care Management services today including:  1. CCM service includes personalized support from designated clinical staff supervised by his physician, including individualized plan of care and coordination with other care providers 2. 24/7 contact phone numbers for assistance for urgent and routine care needs. 3. Standard insurance, coinsurance, copays and deductibles apply for chronic care management only during months in which we provide at least 20 minutes of these services. Most insurances cover these services at 100%, however patients may be responsible for any copay, coinsurance and/or deductible if applicable. This service may help you avoid the need for more expensive face-to-face services. 4. Only one practitioner may furnish and bill the service in a calendar month. 5. The patient may stop CCM services at any time (effective at the end of the  month) by phone call to the office staff.  Patient agreed to services and verbal consent obtained.   The patient verbalized understanding of instructions provided today and agreed to receive  a mailed copy of patient instruction and/or educational materials. Telephone follow up appointment with pharmacy team member scheduled for: 11/25/2019  Anson Crofts, PharmD Clinical Pharmacist [facility] [place] [phone]   Heart-Healthy Eating Plan Heart-healthy meal planning includes:  Eating less unhealthy fats.  Eating more healthy fats.  Making other changes in your diet. Talk with your doctor or a diet specialist (dietitian) to create an eating plan that is right for you. What is my plan? Your doctor may recommend an eating plan that includes:  Total fat: ______% or less of total calories a day.  Saturated fat: ______% or less of total calories a day.  Cholesterol: less than _________mg a day. What are tips for following this plan? Cooking Avoid frying your food. Try to bake, boil, grill, or broil it instead. You can also reduce fat by:  Removing the skin from poultry.  Removing all visible fats from meats.  Steaming vegetables in water or broth. Meal planning   At meals, divide your plate into four equal parts: ? Fill one-half of your plate with vegetables and green salads. ? Fill one-fourth of your plate with whole grains. ? Fill one-fourth of your plate with lean protein foods.  Eat 4-5 servings of vegetables per day. A serving of vegetables is: ? 1 cup of raw or cooked vegetables. ? 2 cups of raw leafy greens.  Eat 4-5 servings of fruit per day. A serving of fruit is: ? 1 medium whole fruit. ?  cup of dried fruit. ?  cup of fresh, frozen, or canned fruit. ?  cup of 100% fruit juice.  Eat more foods that have soluble fiber. These are apples, broccoli, carrots, beans, peas, and barley. Try to get 20-30 g of fiber per day.  Eat 4-5 servings of nuts, legumes, and seeds per week: ? 1 serving of dried beans or legumes equals  cup after being cooked. ? 1 serving of nuts is  cup. ? 1 serving of seeds equals 1  tablespoon. General information  Eat more home-cooked food. Eat less restaurant, buffet, and fast food.  Limit or avoid alcohol.  Limit foods that are high in starch and sugar.  Avoid fried foods.  Lose weight if you are overweight.  Keep track of how much salt (sodium) you eat. This is important if you have high blood pressure. Ask your doctor to tell you more about this.  Try to add vegetarian meals each week. Fats  Choose healthy fats. These include olive oil and canola oil, flaxseeds, walnuts, almonds, and seeds.  Eat more omega-3 fats. These include salmon, mackerel, sardines, tuna, flaxseed oil, and ground flaxseeds. Try to eat fish at least 2 times each week.  Check food labels. Avoid foods with trans fats or high amounts of saturated fat.  Limit saturated fats. ? These are often found in animal products, such as meats, butter, and cream. ? These are also found in plant foods, such as palm oil, palm kernel oil, and coconut oil.  Avoid foods with partially hydrogenated oils in them. These have trans fats. Examples are stick margarine, some tub margarines, cookies, crackers, and other baked goods. What foods can I eat? Fruits All fresh, canned (in natural juice), or frozen fruits. Vegetables Fresh or frozen vegetables (raw, steamed, roasted, or grilled).  Green salads. Grains Most grains. Choose whole wheat and whole grains most of the time. Rice and pasta, including brown rice and pastas made with whole wheat. Meats and other proteins Lean, well-trimmed beef, veal, pork, and lamb. Chicken and [place] without skin. All fish and shellfish. Wild duck, rabbit, pheasant, and venison. Egg whites or low-cholesterol egg substitutes. Dried beans, peas, lentils, and tofu. Seeds and most nuts. Dairy Low-fat or nonfat cheeses, including ricotta and mozzarella. Skim or 1% milk that is liquid, powdered, or evaporated. Buttermilk that is made with low-fat milk. Nonfat or low-fat  yogurt. Fats and oils Non-hydrogenated (trans-free) margarines. Vegetable oils, including soybean, sesame, sunflower, olive, peanut, safflower, corn, canola, and cottonseed. Salad dressings or mayonnaise made with a vegetable oil. Beverages Mineral water. Coffee and tea. Diet carbonated beverages. Sweets and desserts Sherbet, gelatin, and fruit ice. Small amounts of dark chocolate. Limit all sweets and desserts. Seasonings and condiments All seasonings and condiments. The items listed above may not be a complete list of foods and drinks you can eat. Contact a dietitian for more options. What foods should I avoid? Fruits Canned fruit in heavy syrup. Fruit in cream or butter sauce. Fried fruit. Limit coconut. Vegetables Vegetables cooked in cheese, cream, or butter sauce. Fried vegetables. Grains Breads that are made with saturated or trans fats, oils, or whole milk. Croissants. Sweet rolls. Donuts. High-fat crackers, such as cheese crackers. Meats and other proteins Fatty meats, such as hot dogs, ribs, sausage, bacon, rib-eye roast or steak. High-fat deli meats, such as salami and bologna. Caviar. Domestic duck and goose. Organ meats, such as liver. Dairy Cream, sour cream, cream cheese, and creamed cottage cheese. Whole-milk cheeses. Whole or 2% milk that is liquid, evaporated, or condensed. Whole buttermilk. Cream sauce or high-fat cheese sauce. Yogurt that is made from whole milk. Fats and oils Meat fat, or shortening. Cocoa butter, hydrogenated oils, palm oil, coconut oil, palm kernel oil. Solid fats and shortenings, including bacon fat, salt pork, lard, and butter. Nondairy cream substitutes. Salad dressings with cheese or sour cream. Beverages Regular sodas and juice drinks with added sugar. Sweets and desserts Frosting. Pudding. Cookies. Cakes. Pies. Milk chocolate or white chocolate. Buttered syrups. Full-fat ice cream or ice cream drinks. The items listed above may not be a  complete list of foods and drinks to avoid. Contact a dietitian for more information. Summary  Heart-healthy meal planning includes eating less unhealthy fats, eating more healthy fats, and making other changes in your diet.  Eat a balanced diet. This includes fruits and vegetables, low-fat or nonfat dairy, lean protein, nuts and legumes, whole grains, and heart-healthy oils and fats. This information is not intended to replace advice given to you by your health care provider. Make sure you discuss any questions you have with your health care provider. Document Revised: 03/28/2017 Document Reviewed: 03/01/2017 Elsevier Patient Education  2020 [employer].

## 2019-07-07 ENCOUNTER — Encounter: Payer: Self-pay | Admitting: Dermatology

## 2019-07-07 ENCOUNTER — Ambulatory Visit (INDEPENDENT_AMBULATORY_CARE_PROVIDER_SITE_OTHER): Payer: Medicare Other | Admitting: Dermatology

## 2019-07-07 ENCOUNTER — Other Ambulatory Visit: Payer: Self-pay

## 2019-07-07 DIAGNOSIS — D1720 Benign lipomatous neoplasm of skin and subcutaneous tissue of unspecified limb: Secondary | ICD-10-CM | POA: Diagnosis not present

## 2019-07-07 DIAGNOSIS — D485 Neoplasm of uncertain behavior of skin: Secondary | ICD-10-CM

## 2019-07-07 DIAGNOSIS — L570 Actinic keratosis: Secondary | ICD-10-CM | POA: Diagnosis not present

## 2019-07-07 DIAGNOSIS — D225 Melanocytic nevi of trunk: Secondary | ICD-10-CM | POA: Diagnosis not present

## 2019-07-07 DIAGNOSIS — L729 Follicular cyst of the skin and subcutaneous tissue, unspecified: Secondary | ICD-10-CM

## 2019-07-07 DIAGNOSIS — I25118 Atherosclerotic heart disease of native coronary artery with other forms of angina pectoris: Secondary | ICD-10-CM

## 2019-07-07 DIAGNOSIS — L821 Other seborrheic keratosis: Secondary | ICD-10-CM | POA: Diagnosis not present

## 2019-07-07 DIAGNOSIS — S62323S Displaced fracture of shaft of third metacarpal bone, left hand, sequela: Secondary | ICD-10-CM | POA: Diagnosis not present

## 2019-07-07 DIAGNOSIS — D229 Melanocytic nevi, unspecified: Secondary | ICD-10-CM

## 2019-07-07 NOTE — Patient Instructions (Signed)

## 2019-07-07 NOTE — Progress Notes (Addendum)
New Patient   Subjective  Tyrone Roman is a 74 y.o. male who presents for the following: Annual Exam (right chest- dark spots).  Growths Location: Left collarbone Duration: 1 year Quality: Larger Associated Signs/Symptoms: Modifying Factors:  Severity:  Timing: Context: Would like general skin check   The following portions of the chart were reviewed this encounter and updated as appropriate: Tobacco  Allergies  Meds  Problems  Med Hx  Surg Hx  Fam Hx      Objective  Well appearing patient in no apparent distress; mood and affect are within normal limits.  A full examination was performed including scalp, head, eyes, ears, nose, lips, neck, chest, axillae, abdomen, back, buttocks, bilateral upper extremities, bilateral lower extremities, hands, feet, fingers, toes, fingernails, and toenails. All findings within normal limits unless otherwise noted below.   Assessment & Plan  Neoplasm of uncertain behavior of skin (2) Right Inferior Helix  Skin / nail biopsy Type of biopsy: tangential   Informed consent: discussed and consent obtained   Timeout: patient name, date of birth, surgical site, and procedure verified   Procedure prep:  Patient was prepped and draped in usual sterile fashion Prep type:  Chlorhexidine Anesthesia: the lesion was anesthetized in a standard fashion   Anesthetic:  1% lidocaine w/ epinephrine 1-100,000 local infiltration Instrument used: flexible razor blade   Hemostasis achieved with: ferric subsulfate   Outcome: patient tolerated procedure well   Post-procedure details: wound care instructions given    Specimen 1 - Surgical pathology Differential Diagnosis: bcc vs scc Check Margins: No  Right Upper Arm - Posterior  Skin / nail biopsy Type of biopsy: tangential   Informed consent: discussed and consent obtained   Timeout: patient name, date of birth, surgical site, and procedure verified   Procedure prep:  Patient was prepped and  draped in usual sterile fashion Prep type:  Chlorhexidine Anesthesia: the lesion was anesthetized in a standard fashion   Anesthetic:  1% lidocaine w/ epinephrine 1-100,000 local infiltration Instrument used: flexible razor blade   Hemostasis achieved with: ferric subsulfate   Outcome: patient tolerated procedure well   Post-procedure details: wound care instructions given    Skin / nail biopsy  Specimen 2 - Surgical pathology Differential Diagnosis: bcc vs scc Check Margins: No  Lipoma of upper extremity, unspecified laterality (2) Left Forearm - Anterior; Right Forearm - Anterior  I explained the benign nature of fatty cysts.  Seborrheic keratosis (2) Neck - Anterior  Leave if stable.  Nevus (2) Left Upper Back; Right Upper Back  Annual dermatological examination. New patient visit for Tyrone Roman date of birth 28-May-1945.  No definite history of previous skin cancer.  Of some concern to him were 2 dark spots around the left clavicle which clinically proved to be benign seborrheic keratoses and are safe to leave forever.  He has literally 100s of small keratoses on his legs.  He notices when applying lotion they will sometimes peel off but they always recur.  If there are any spots of particular annoyance he may try using a drop of castor oil overnight under an occlusive piece of tape for 1-3 nights.  On both forearms are subcutaneous two centimeter lumps which are benign lipomas (fatty cysts).  On the left scapula is a smaller cyst which is more likely an epidermoid cyst than a lipoma.  Beneath the left inner eye and on the right mid cheek are white under the skin dots that are  milia or mini cysts.  These are also benign.  He is prone to get skin tags particularly under the arm and on the right chest which he may choose in the future to have removed.  On the right ear and on the right outer lower biceps are thick crusts which may represent low risk nonmelanoma skin cancers.   Each of these was shave biopsied.  There are no restrictions on [name]'s activity.  Either he will check on his home MyChart or call my office on Friday to discuss these results.  If neither one is skin cancer, then I would encourage him to let someone do an annual general skin check.

## 2019-07-09 ENCOUNTER — Telehealth: Payer: Self-pay

## 2019-07-09 NOTE — Telephone Encounter (Signed)
Phone call to patient with his Pathology results and Dr. Onalee Hua recommendations.  Patient aware of pathology results and Dr. Onalee Hua recommendations, patient states he'll just wait and give the office a call back after the summer time if he needs it.

## 2019-07-09 NOTE — Telephone Encounter (Signed)
-----   Message from Lavonna Monarch, MD sent at 07/09/2019  6:57 AM EDT ----- Both are thick precancers; schedule quick look after summer and may cancel if smooth.

## 2019-07-12 ENCOUNTER — Encounter: Payer: Self-pay | Admitting: Dermatology

## 2019-08-07 NOTE — Addendum Note (Signed)
Addended by: Lavonna Monarch on: 08/07/2019 11:07 PM   Modules accepted: Orders

## 2019-08-11 ENCOUNTER — Other Ambulatory Visit: Payer: Self-pay

## 2019-08-11 ENCOUNTER — Other Ambulatory Visit (INDEPENDENT_AMBULATORY_CARE_PROVIDER_SITE_OTHER): Payer: Medicare Other

## 2019-08-11 ENCOUNTER — Telehealth: Payer: Self-pay | Admitting: Family Medicine

## 2019-08-11 DIAGNOSIS — R972 Elevated prostate specific antigen [PSA]: Secondary | ICD-10-CM | POA: Diagnosis not present

## 2019-08-11 DIAGNOSIS — H9319 Tinnitus, unspecified ear: Secondary | ICD-10-CM

## 2019-08-11 LAB — PSA: PSA: 2.07 ng/mL (ref 0.10–4.00)

## 2019-08-11 NOTE — Telephone Encounter (Signed)
Patient is requesting a specialist for ringing in his ears.

## 2019-08-12 ENCOUNTER — Other Ambulatory Visit: Payer: Medicare Other

## 2019-08-12 NOTE — Telephone Encounter (Signed)
The referral was done  

## 2019-08-12 NOTE — Telephone Encounter (Signed)
Pt stated he has a hx of tinnitus and would like a referral for specialist.   Please advise

## 2019-08-14 NOTE — Telephone Encounter (Signed)
Patient notified of update  and verbalized understanding. 

## 2019-08-14 NOTE — Telephone Encounter (Signed)
Left message to return phone call.

## 2019-08-24 ENCOUNTER — Encounter: Payer: Self-pay | Admitting: Family Medicine

## 2019-08-24 ENCOUNTER — Telehealth: Payer: Self-pay | Admitting: Family Medicine

## 2019-08-24 ENCOUNTER — Other Ambulatory Visit: Payer: Self-pay

## 2019-08-24 ENCOUNTER — Ambulatory Visit (INDEPENDENT_AMBULATORY_CARE_PROVIDER_SITE_OTHER): Payer: Medicare Other | Admitting: Family Medicine

## 2019-08-24 VITALS — BP 100/62 | HR 117 | Temp 98.0°F | Ht 74.0 in | Wt 232.0 lb

## 2019-08-24 DIAGNOSIS — R300 Dysuria: Secondary | ICD-10-CM | POA: Diagnosis not present

## 2019-08-24 DIAGNOSIS — N410 Acute prostatitis: Secondary | ICD-10-CM

## 2019-08-24 DIAGNOSIS — I25118 Atherosclerotic heart disease of native coronary artery with other forms of angina pectoris: Secondary | ICD-10-CM

## 2019-08-24 LAB — POC URINALSYSI DIPSTICK (AUTOMATED)
Bilirubin, UA: NEGATIVE
Glucose, UA: NEGATIVE
Ketones, UA: NEGATIVE
Nitrite, UA: NEGATIVE
Protein, UA: POSITIVE — AB
Spec Grav, UA: 1.02 (ref 1.010–1.025)
Urobilinogen, UA: 0.2 E.U./dL
pH, UA: 6.5 (ref 5.0–8.0)

## 2019-08-24 MED ORDER — CIPROFLOXACIN HCL 500 MG PO TABS
500.0000 mg | ORAL_TABLET | Freq: Two times a day (BID) | ORAL | 0 refills | Status: DC
Start: 2019-08-24 — End: 2020-03-03

## 2019-08-24 NOTE — Progress Notes (Signed)
Established Patient Office Visit  Subjective:  Patient ID: Tyrone Roman, male    DOB: Nov 30, 1945  Age: 74 y.o. MRN: [id number]  CC:  Chief Complaint  Patient presents with  . Dysuria    patient complains of urinary frequency and burning x1 day   . Chills    HPI [name] presents for 2 to 3-day history of some increased fatigue, mild increased urine frequency and mild burning with urination.  He has had history of recurrent prostatitis and states that the symptoms are almost identical.  He is not aware of any fever.  No chills.  No nausea or vomiting.  He was treated recently with Cipro for similar symptoms and he did improve with that.  Chronic problems include history of CAD, COPD, rheumatoid arthritis, BPH.  He takes Flomax regularly.  Occasional slow stream but his flow is fairly good overall.  Past Medical History:  Diagnosis Date  . Allergy   . Barrett's esophageal ulceration   . BPH (benign prostatic hyperplasia)   . CAD (coronary artery disease)    sees Dr. Mar Daring  . Cataract    both eyes removed   . Colonic polyp   . Concussion with loss of consciousness of 30 minutes or less   . Contact dermatitis   . Contact with or exposure to venereal diseases   . COPD (chronic obstructive pulmonary disease) ([place])    sees Dr. Kara Mead   . Diverticulosis of colon   . Dysphagia   . GERD (gastroesophageal reflux disease)    past hx- on meds   . HNP (herniated nucleus pulposus), lumbar    recurrent  . Hyperlipidemia   . Hypertension   . Laceration of scalp   . Myocardial infarction [facility])    2011- stent placed  . Nephrolithiasis   . Pre-operative cardiovascular examination   . Rheumatoid arthritis [facility])    sees Dr. Gavin Pound   . SOB (shortness of breath)     Past Surgical History:  Procedure Laterality Date  . BACK SURGERY    . CARDIAC CATHETERIZATION N/A 01/19/2016   Procedure: Left Heart Cath and Coronary Angiography;  Surgeon: Burnell Blanks,  MD;  Location: [place] [facility];  Service: Cardiovascular;  Laterality: N/A;  . COLONOSCOPY  12/09/2014   per Dr. Silverio Decamp, adenomatous polyps, repeat 3 years   . CORONARY ANGIOPLASTY WITH STENT PLACEMENT    . ESOPHAGOGASTRODUODENOSCOPY (EGD) WITH ESOPHAGEAL DILATION  02/17/09   Barretts esophagus   . EYE SURGERY    . KNEE ARTHROSCOPY Left X 2  . LUMBAR LAMINECTOMY/DECOMPRESSION MICRODISCECTOMY Right 10/07/2017   Procedure: RIGHT LUMBAR ONE-TWO LAMINECTOMY WITH MICRODISCECTOMY;  Surgeon: Consuella Lose, MD;  Location: [place];  Service: Neurosurgery;  Laterality: Right;  . LUMBAR LAMINECTOMY/DECOMPRESSION MICRODISCECTOMY N/A 12/06/2017   Procedure: RECURRENT MICRODISCECTOMY LUMBAR ONE - LUMBAR TWO;  Surgeon: Consuella Lose, MD;  Location: [place];  Service: Neurosurgery;  Laterality: N/A;  . LUMBAR WOUND DEBRIDEMENT N/A 12/11/2017   Procedure: LUMBAR WOUND DEBRIDEMENT;  Surgeon: Consuella Lose, MD;  Location: [place];  Service: Neurosurgery;  Laterality: N/A;  . POLYPECTOMY    . TONSILLECTOMY    . UPPER GASTROINTESTINAL ENDOSCOPY      Family History  Problem Relation Age of Onset  . Heart attack Father        cardiovascular disorder  . Arthritis Father        family hx  . Colon cancer Father  mets  . Prostate cancer Father        1st degree relative  . Arthritis Mother   . Colon polyps Mother   . Melanoma Mother   . Diabetes Paternal Uncle   . Colon cancer Paternal Uncle   . Stomach cancer Paternal Uncle   . Melanoma Paternal Uncle   . Cancer Maternal Grandmother   . Skin cancer Daughter   . Colon polyps Sister   . Esophageal cancer Neg Hx   . Rectal cancer Neg Hx     Social History   Socioeconomic History  . Marital status: Divorced    Spouse name: Not on file  . Number of children: 4  . Years of education: Not on file  . Highest education level: Not on file  Occupational History  . Occupation: ENGINEER    Employer: [employer]  Tobacco Use  .  Smoking status: Former Smoker    Packs/day: 1.00    Years: 20.00    Pack years: 20.00    Types: Cigarettes    Quit date: 10/12/1988    Years since quitting: 30.8  . Smokeless tobacco: Never Used  . Tobacco comment: 1960's   Vaping Use  . Vaping Use: Never used  Substance and Sexual Activity  . Alcohol use: Yes    Alcohol/week: 0.0 standard drinks    Comment: rare  . Drug use: No  . Sexual activity: Not on file  Other Topics Concern  . 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       04/14/2018:   Lives alone, has girlfriend who visits, main source of emotional support   Has been struggling with recent hospitalizations/ill health following sepsis and back surgeries   Social Determinants of Health   Financial Resource Strain: Low Risk   . Difficulty of Paying Living Expenses: Not hard at all  Food Insecurity:   . Worried About Charity fundraiser in the Last Year:   . Arboriculturist in the Last Year:   Transportation Needs: No Transportation Needs  . Lack of Transportation (Medical): No  . Lack of Transportation (Non-Medical): No  Physical Activity:   . Days of Exercise per Week:   . Minutes of Exercise per Session:   Stress:   . Feeling of Stress :   Social Connections:   . Frequency of Communication with Friends and Family:   . Frequency of Social Gatherings with Friends and Family:   . Attends Religious Services:   . Active Member of Clubs or Organizations:   . Attends Archivist Meetings:   Marland Kitchen Marital Status:   Intimate Partner Violence:   . Fear of Current or Ex-Partner:   . Emotionally Abused:   Marland Kitchen Physically Abused:   . Sexually Abused:     Outpatient Medications Prior to Visit  Medication Sig Dispense Refill  . acetaminophen (TYLENOL) 325 MG tablet Take 2 tablets (650 mg total) by mouth every 6 (six) hours as needed for mild pain, fever or headache.    Marland Kitchen aspirin  EC 81 MG tablet Take 1 tablet (81 mg total) by mouth daily.    . Biotin 1 MG CAPS biotin    . cetirizine (ZYRTEC) 5 MG chewable tablet as needed.     . ciprofloxacin (CIPRO) 500 MG tablet Take 1 tablet (500 mg total) by mouth 2 (two) times daily. 28 tablet 0  . ibuprofen (  ADVIL) 200 MG tablet Take 800 mg by mouth every 6 (six) hours as needed.    . leflunomide (ARAVA) 20 MG tablet Take 20 mg by mouth daily.    Marland Kitchen MAGNESIUM PO Take by mouth.    . metoprolol succinate (TOPROL-XL) 25 MG 24 hr tablet Take 1 tablet (25 mg total) by mouth daily. 90 tablet 3  . nitroGLYCERIN (NITROSTAT) 0.4 MG SL tablet Place 1 tablet (0.4 mg total) under the tongue every 5 (five) minutes as needed for chest pain (3 doses max). 25 tablet 3  . omeprazole (PRILOSEC) 40 MG capsule Take 1 capsule (40 mg total) by mouth daily. 90 capsule 3  . potassium chloride SA (KLOR-CON M20) 20 MEQ tablet Take 1 tablet (20 mEq total) by mouth daily. 90 tablet 3  . predniSONE (DELTASONE) 10 MG tablet Take 1 tablet (10 mg total) by mouth daily with breakfast. (Patient taking differently: Take 5 mg by mouth daily with breakfast. ) 30 tablet 2  . riTUXimab (RITUXAN) 500 MG/50ML injection Inject into the vein. Every 4 months    . sildenafil (REVATIO) 20 MG tablet TAKE ONE TABLET BY MOUTH DAILY AS NEEDED 50 tablet 10  . simvastatin (ZOCOR) 40 MG tablet TAKE ONE TABLET BY MOUTH DAILY 90 tablet 1  . tamsulosin (FLOMAX) 0.4 MG CAPS capsule Take 1 capsule (0.4 mg total) by mouth daily. 90 capsule 3  . traMADol (ULTRAM) 50 MG tablet Take 1-2 tablets (50-100 mg total) by mouth 3 (three) times daily as needed. 30 tablet 2  . zolpidem (AMBIEN) 10 MG tablet TAKE ONE TABLET BY MOUTH EVERY NIGHT AT BEDTIME 30 tablet 4   No facility-administered medications prior to visit.    Allergies  Allergen Reactions  . Cephalexin Shortness Of Breath, Rash and Other (See Comments)    Tolerated Augmentin 2015 and 2017 (12-2017). Tolerated nafcillin 12/2017  PATIENT HAS HAD A PCN REACTION WITH IMMEDIATE RASH, FACIAL/TONGUE/THROAT SWELLING, SOB, OR LIGHTHEADEDNESS WITH HYPOTENSION:  #  #  YES  #  #  Has patient had a PCN reaction causing severe rash involving mucus membranes or skin necrosis: No Has patient had a PCN reaction that required hospitalization: No Has patient had a PCN reaction occurring within the last 10 years: No If all of the above answers are "NO", then may proceed  . Clarithromycin Shortness Of Breath and Rash  . Certolizumab Pegol Hives  . Tocilizumab   . Doxycycline Rash  . Hydroxyzine Rash  . Lidoderm Other (See Comments)    "made me act weird"  . Pyrithione Zinc Rash    ROS Review of Systems  Constitutional: Negative for chills and fever.  Respiratory: Negative for shortness of breath.   Cardiovascular: Negative for chest pain.  Gastrointestinal: Negative for nausea and vomiting.  Genitourinary: Positive for frequency. Negative for difficulty urinating, flank pain and hematuria.      Objective:    Physical Exam Cardiovascular:     Rate and Rhythm: Regular rhythm.  Pulmonary:     Effort: Pulmonary effort is normal.     Breath sounds: Normal breath sounds.  Musculoskeletal:     Right lower leg: No edema.     Left lower leg: No edema.     BP 100/62 (BP Location: Left Arm, Patient Position: Sitting, Cuff Size: Large)   Pulse (!) 117   Temp 98 F (36.7 C) (Oral)   Ht 6\' 2"  (1.88 m)   Wt 232 lb (105.2 kg)   SpO2 93%   BMI  29.79 kg/m  Wt Readings from Last 3 Encounters:  08/24/19 232 lb (105.2 kg)  06/17/19 230 lb (104.3 kg)  06/04/19 230 lb (104.3 kg)     Health Maintenance Due  Topic Date Due  . Hepatitis C Screening  Never done  . TETANUS/TDAP  02/06/2016  . PNA vac Low Risk Adult (2 of 2 - PPSV23) 10/03/2016    There are no preventive care reminders to display for this patient.  Lab Results  Component Value Date   TSH 1.79 05/11/2019   Lab Results  Component Value Date   WBC 6.6  05/11/2019   HGB 14.0 05/11/2019   HCT 42.1 05/11/2019   MCV 89.6 05/11/2019   PLT 177.0 05/11/2019   Lab Results  Component Value Date   NA 141 05/11/2019   K 4.1 05/11/2019   CO2 29 05/11/2019   GLUCOSE 101 (H) 05/11/2019   BUN 20 05/11/2019   CREATININE 1.21 05/11/2019   BILITOT 0.5 05/11/2019   ALKPHOS 52 05/11/2019   AST 22 05/11/2019   ALT 17 05/11/2019   PROT 6.5 05/11/2019   ALBUMIN 4.0 05/11/2019   CALCIUM 10.1 05/11/2019   ANIONGAP 10 12/17/2017   GFR 58.63 (L) 05/11/2019   Lab Results  Component Value Date   CHOL 158 05/11/2019   Lab Results  Component Value Date   HDL 47.70 05/11/2019   Lab Results  Component Value Date   LDLCALC 83 05/11/2019   Lab Results  Component Value Date   TRIG 139.0 05/11/2019   Lab Results  Component Value Date   CHOLHDL 3 05/11/2019   Lab Results  Component Value Date   HGBA1C 6.0 05/11/2019      Assessment & Plan:   Dysuria.  Suspect acute/recurrent prostatitis.  Patient is nontoxic in appearance.  He does have slightly elevated pulse but no fever and other vitals are stable.  -Start back Cipro 500 mg twice daily for 10 days -Plenty of fluids and stay well-hydrated -Urine culture sent -Follow-up immediately for any persistent or worsening symptoms  Meds ordered this encounter  Medications  . ciprofloxacin (CIPRO) 500 MG tablet    Sig: Take 1 tablet (500 mg total) by mouth 2 (two) times daily.    Dispense:  20 tablet    Refill:  0    Follow-up: No follow-ups on file.    [name], MD

## 2019-08-24 NOTE — Patient Instructions (Signed)

## 2019-08-24 NOTE — Telephone Encounter (Signed)
Call in Cipro 500 mg BID for 14 days  °

## 2019-08-24 NOTE — Telephone Encounter (Signed)
Pt stated he feels very sluggish and urinating frequently. Pt thinks he is having issues again with his prostate and wonders if the medication can be called in since he was seen recently for it?   Pt can be reached at [phone]

## 2019-08-25 LAB — URINE CULTURE
MICRO NUMBER:: 10721297
SPECIMEN QUALITY:: ADEQUATE

## 2019-08-25 MED ORDER — CIPROFLOXACIN HCL 500 MG PO TABS
500.0000 mg | ORAL_TABLET | Freq: Two times a day (BID) | ORAL | 0 refills | Status: DC
Start: 2019-08-25 — End: 2020-03-03

## 2019-09-07 ENCOUNTER — Other Ambulatory Visit: Payer: Self-pay | Admitting: Family Medicine

## 2019-09-16 ENCOUNTER — Other Ambulatory Visit: Payer: Self-pay

## 2019-09-16 ENCOUNTER — Ambulatory Visit (INDEPENDENT_AMBULATORY_CARE_PROVIDER_SITE_OTHER): Payer: Medicare Other | Admitting: Otolaryngology

## 2019-09-16 ENCOUNTER — Encounter (INDEPENDENT_AMBULATORY_CARE_PROVIDER_SITE_OTHER): Payer: Self-pay | Admitting: Otolaryngology

## 2019-09-16 VITALS — Temp 97.0°F

## 2019-09-16 DIAGNOSIS — I25118 Atherosclerotic heart disease of native coronary artery with other forms of angina pectoris: Secondary | ICD-10-CM | POA: Diagnosis not present

## 2019-09-16 DIAGNOSIS — H903 Sensorineural hearing loss, bilateral: Secondary | ICD-10-CM | POA: Diagnosis not present

## 2019-09-16 DIAGNOSIS — H9313 Tinnitus, bilateral: Secondary | ICD-10-CM

## 2019-09-16 DIAGNOSIS — M79642 Pain in left hand: Secondary | ICD-10-CM | POA: Diagnosis not present

## 2019-09-16 NOTE — Progress Notes (Signed)
HPI: Tyrone Roman is a 74 y.o. male who presents is referred by Dr. Sarajane Jews for evaluation of tinnitus in both ears that he has developed over the past few months.  He has had noise exposure in the past as he used to work in [employer], educational.  He has not noted a significant change in his hearing.  He is presently not exposed to loud noise.  But he complains of hearing or ringing in the ears which is worse in the mornings..  Past Medical History:  Diagnosis Date  . Allergy   . Barrett's esophageal ulceration   . BPH (benign prostatic hyperplasia)   . CAD (coronary artery disease)    sees Dr. Mar Daring  . Cataract    both eyes removed   . Colonic polyp   . Concussion with loss of consciousness of 30 minutes or less   . Contact dermatitis   . Contact with or exposure to venereal diseases   . COPD (chronic obstructive pulmonary disease) ([place])    sees Dr. Kara Mead   . Diverticulosis of colon   . Dysphagia   . GERD (gastroesophageal reflux disease)    past hx- on meds   . HNP (herniated nucleus pulposus), lumbar    recurrent  . Hyperlipidemia   . Hypertension   . Laceration of scalp   . Myocardial infarction [facility])    2011- stent placed  . Nephrolithiasis   . Pre-operative cardiovascular examination   . Rheumatoid arthritis [facility])    sees Dr. Gavin Pound   . SOB (shortness of breath)    Past Surgical History:  Procedure Laterality Date  . BACK SURGERY    . CARDIAC CATHETERIZATION N/A 01/19/2016   Procedure: Left Heart Cath and Coronary Angiography;  Surgeon: Burnell Blanks, MD;  Location: [place] [facility];  Service: Cardiovascular;  Laterality: N/A;  . COLONOSCOPY  12/09/2014   per Dr. Silverio Decamp, adenomatous polyps, repeat 3 years   . CORONARY ANGIOPLASTY WITH STENT PLACEMENT    . ESOPHAGOGASTRODUODENOSCOPY (EGD) WITH ESOPHAGEAL DILATION  02/17/09   Barretts esophagus   . EYE SURGERY    . KNEE ARTHROSCOPY Left X 2  . LUMBAR LAMINECTOMY/DECOMPRESSION MICRODISCECTOMY Right  10/07/2017   Procedure: RIGHT LUMBAR ONE-TWO LAMINECTOMY WITH MICRODISCECTOMY;  Surgeon: Consuella Lose, MD;  Location: [place];  Service: Neurosurgery;  Laterality: Right;  . LUMBAR LAMINECTOMY/DECOMPRESSION MICRODISCECTOMY N/A 12/06/2017   Procedure: RECURRENT MICRODISCECTOMY LUMBAR ONE - LUMBAR TWO;  Surgeon: Consuella Lose, MD;  Location: [place];  Service: Neurosurgery;  Laterality: N/A;  . LUMBAR WOUND DEBRIDEMENT N/A 12/11/2017   Procedure: LUMBAR WOUND DEBRIDEMENT;  Surgeon: Consuella Lose, MD;  Location: [place];  Service: Neurosurgery;  Laterality: N/A;  . POLYPECTOMY    . TONSILLECTOMY    . UPPER GASTROINTESTINAL ENDOSCOPY     Social History   Socioeconomic History  . Marital status: Divorced    Spouse name: Not on file  . Number of children: 4  . Years of education: Not on file  . Highest education level: Not on file  Occupational History  . Occupation: ENGINEER    Employer: [employer]  Tobacco Use  . Smoking status: Former Smoker    Packs/day: 1.00    Years: 20.00    Pack years: 20.00    Types: Cigarettes    Quit date: 10/12/1988    Years since quitting: 30.9  . Smokeless tobacco: Never Used  . Tobacco comment: 1960's   Vaping Use  . Vaping Use: Never used  Substance and Sexual Activity  . Alcohol use: Yes    Alcohol/week: 0.0 standard drinks    Comment: rare  . Drug use: No  . Sexual activity: Not on file  Other Topics Concern  . 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       04/14/2018:   Lives alone, has girlfriend who visits, main source of emotional support   Has been struggling with recent hospitalizations/ill health following sepsis and back surgeries   Social Determinants of Health   Financial Resource Strain: Low Risk   . Difficulty of Paying Living Expenses: Not hard at all  Food Insecurity:   . Worried About Charity fundraiser in the Last Year:   .  Arboriculturist in the Last Year:   Transportation Needs: No Transportation Needs  . Lack of Transportation (Medical): No  . Lack of Transportation (Non-Medical): No  Physical Activity:   . Days of Exercise per Week:   . Minutes of Exercise per Session:   Stress:   . Feeling of Stress :   Social Connections:   . Frequency of Communication with Friends and Family:   . Frequency of Social Gatherings with Friends and Family:   . Attends Religious Services:   . Active Member of Clubs or Organizations:   . Attends Archivist Meetings:   Marland Kitchen Marital Status:    Family History  Problem Relation Age of Onset  . Heart attack Father        cardiovascular disorder  . Arthritis Father        family hx  . Colon cancer Father        mets  . Prostate cancer Father        1st degree relative  . Arthritis Mother   . Colon polyps Mother   . Melanoma Mother   . Diabetes Paternal Uncle   . Colon cancer Paternal Uncle   . Stomach cancer Paternal Uncle   . Melanoma Paternal Uncle   . Cancer Maternal Grandmother   . Skin cancer Daughter   . Colon polyps Sister   . Esophageal cancer Neg Hx   . Rectal cancer Neg Hx    Allergies  Allergen Reactions  . Cephalexin Shortness Of Breath, Rash and Other (See Comments)    Tolerated Augmentin 2015 and 2017 (12-2017). Tolerated nafcillin 12/2017 PATIENT HAS HAD A PCN REACTION WITH IMMEDIATE RASH, FACIAL/TONGUE/THROAT SWELLING, SOB, OR LIGHTHEADEDNESS WITH HYPOTENSION:  #  #  YES  #  #  Has patient had a PCN reaction causing severe rash involving mucus membranes or skin necrosis: No Has patient had a PCN reaction that required hospitalization: No Has patient had a PCN reaction occurring within the last 10 years: No If all of the above answers are "NO", then may proceed  . Clarithromycin Shortness Of Breath and Rash  . Certolizumab Pegol Hives  . Tocilizumab   . Doxycycline Rash  . Hydroxyzine Rash  . Lidoderm Other (See Comments)    "made  me act weird"  . Pyrithione Zinc Rash   Prior to Admission medications   Medication Sig Start Date End Date Taking? Authorizing Provider  acetaminophen (TYLENOL) 325 MG tablet Take 2 tablets (650 mg total) by mouth every 6 (six) hours as needed for mild pain, fever or headache. 12/17/17  Yes Cherene Altes, MD  aspirin EC 81 MG tablet Take 1 tablet (  81 mg total) by mouth daily. 12/11/17  Yes Costella, Vista Mink, PA-C  Biotin 1 MG CAPS biotin   Yes [provider]  cetirizine (ZYRTEC) 5 MG chewable tablet as needed.  02/09/19  Yes [provider]  ciprofloxacin (CIPRO) 500 MG tablet Take 1 tablet (500 mg total) by mouth 2 (two) times daily. 05/11/19  Yes [name], MD  ciprofloxacin (CIPRO) 500 MG tablet Take 1 tablet (500 mg total) by mouth 2 (two) times daily. 08/24/19  Yes Burchette, Alinda Sierras, MD  ciprofloxacin (CIPRO) 500 MG tablet Take 1 tablet (500 mg total) by mouth 2 (two) times daily. 08/25/19  Yes [name], MD  ibuprofen (ADVIL) 200 MG tablet Take 800 mg by mouth every 6 (six) hours as needed.   Yes [provider]  leflunomide (ARAVA) 20 MG tablet Take 20 mg by mouth daily. 02/17/19  Yes [provider]  MAGNESIUM PO Take by mouth.   Yes [provider]  metoprolol succinate (TOPROL-XL) 25 MG 24 hr tablet Take 1 tablet (25 mg total) by mouth daily. 05/11/19  Yes [name], MD  nitroGLYCERIN (NITROSTAT) 0.4 MG SL tablet Place 1 tablet (0.4 mg total) under the tongue every 5 (five) minutes as needed for chest pain (3 doses max). 12/20/17  Yes [name], MD  omeprazole (PRILOSEC) 40 MG capsule Take 1 capsule (40 mg total) by mouth daily. 05/11/19  Yes [name], MD  potassium chloride SA (KLOR-CON M20) 20 MEQ tablet Take 1 tablet (20 mEq total) by mouth daily. 05/11/19  Yes [name], MD  predniSONE (DELTASONE) 10 MG tablet Take 1 tablet (10 mg total) by mouth daily with breakfast. Patient taking differently: Take 5 mg by  mouth daily with breakfast.  04/14/18  Yes [name], MD  riTUXimab (RITUXAN) 500 MG/50ML injection Inject into the vein. Every 4 months   Yes [provider]  sildenafil (REVATIO) 20 MG tablet TAKE ONE TABLET BY MOUTH DAILY AS NEEDED 06/18/18  Yes [name], MD  simvastatin (ZOCOR) 40 MG tablet TAKE ONE TABLET BY MOUTH DAILY 09/07/19  Yes [name], MD  tamsulosin (FLOMAX) 0.4 MG CAPS capsule Take 1 capsule (0.4 mg total) by mouth daily. 05/11/19  Yes [name], MD  traMADol (ULTRAM) 50 MG tablet Take 1-2 tablets (50-100 mg total) by mouth 3 (three) times daily as needed. 12/24/17  Yes Leandrew Koyanagi, MD  zolpidem (AMBIEN) 10 MG tablet TAKE ONE TABLET BY MOUTH EVERY NIGHT AT BEDTIME 08/07/18  Yes [name], MD     Positive ROS: Otherwise negative  All other systems have been reviewed and were otherwise negative with the exception of those mentioned in the HPI and as above.  Physical Exam: Constitutional: Alert, well-appearing, no acute distress Ears: External ears without lesions or tenderness. Ear canals are clear bilaterally with intact, clear TMs.  Nasal: External nose without lesions. Clear nasal passages Oral: Lips and gums without lesions. Tongue and palate mucosa without lesions. Posterior oropharynx clear. Neck: No palpable adenopathy or masses Respiratory: Breathing comfortably  Skin: No facial/neck lesions or rash noted.  Audiogram in the office today demonstrated symmetric hearing in both ears with downsloping SNHL with a maximum hearing loss at 3000 frequency consistent with noise-induced hearing loss.  He has type A tympanograms bilaterally.  SRT's were 20 dB in both ears.  Procedures  Assessment: Bilateral presbycusis or age-related hearing loss along with some underlying noise-induced hearing  loss which is symmetric in both ears.  Secondary tinnitus.  Plan: I discussed with him today that there is not great treatment for tinnitus.  Recommend  using masking noise when the tinnitus is bothersome. He would also be a candidate for hearing aids if he elects to use these as this will help with his hearing as well as help with his tinnitus during the day as this would increase high-frequency environmental sounds and speech.   Radene Journey, MD   CC:

## 2019-09-17 ENCOUNTER — Encounter (INDEPENDENT_AMBULATORY_CARE_PROVIDER_SITE_OTHER): Payer: Self-pay

## 2019-09-22 DIAGNOSIS — M2550 Pain in unspecified joint: Secondary | ICD-10-CM | POA: Diagnosis not present

## 2019-09-22 DIAGNOSIS — Z6829 Body mass index (BMI) 29.0-29.9, adult: Secondary | ICD-10-CM | POA: Diagnosis not present

## 2019-09-22 DIAGNOSIS — M150 Primary generalized (osteo)arthritis: Secondary | ICD-10-CM | POA: Diagnosis not present

## 2019-09-22 DIAGNOSIS — E663 Overweight: Secondary | ICD-10-CM | POA: Diagnosis not present

## 2019-09-22 DIAGNOSIS — M0579 Rheumatoid arthritis with rheumatoid factor of multiple sites without organ or systems involvement: Secondary | ICD-10-CM | POA: Diagnosis not present

## 2019-09-22 DIAGNOSIS — Z79899 Other long term (current) drug therapy: Secondary | ICD-10-CM | POA: Diagnosis not present

## 2019-09-23 ENCOUNTER — Other Ambulatory Visit: Payer: Self-pay

## 2019-09-23 ENCOUNTER — Ambulatory Visit (INDEPENDENT_AMBULATORY_CARE_PROVIDER_SITE_OTHER): Payer: Medicare Other

## 2019-09-23 DIAGNOSIS — Z0000 Encounter for general adult medical examination without abnormal findings: Secondary | ICD-10-CM | POA: Diagnosis not present

## 2019-09-23 NOTE — Patient Instructions (Signed)
Tyrone Roman , Thank you for taking time to come for your Medicare Wellness Visit. I appreciate your ongoing commitment to your health goals. Please review the following plan we discussed and let me know if I can assist you in the future.   Screening recommendations/referrals: Colonoscopy: Up to date, next due 06/18/2024 Recommended yearly ophthalmology/optometry visit for glaucoma screening and checkup Recommended yearly dental visit for hygiene and checkup  Vaccinations: Influenza vaccine: up to date, next due this flu season 2021 Pneumococcal vaccine: Completed series Tdap vaccine: Please bring [place] documentation of your last TDAP so that we may update your chart. Shingles vaccine: Currently due, Please contact your insurance or your pharmacy to discuss cost. We recommend that you get this vaccine at your pharmacy.    Advanced directives: Please bring a copy of your advanced directives to your next office visit.   Conditions/risks identified: None   Next appointment: 11/25/2019 @ 10:00 am via telephone with Pharmacist at [facility] 65 Years and Older, Male Preventive care refers to lifestyle choices and visits with your health care provider that can promote health and wellness. What does preventive care include?  A yearly physical exam. This is also called an annual well check.  Dental exams once or twice a year.  Routine eye exams. Ask your health care provider how often you should have your eyes checked.  Personal lifestyle choices, including:  Daily care of your teeth and gums.  Regular physical activity.  Eating a healthy diet.  Avoiding tobacco and drug use.  Limiting alcohol use.  Practicing safe sex.  Taking low doses of aspirin every day.  Taking vitamin and mineral supplements as recommended by your health care provider. What happens during an annual well check? The services and screenings done by your health care provider during your  annual well check will depend on your age, overall health, lifestyle risk factors, and family history of disease. Counseling  Your health care provider may ask you questions about your:  Alcohol use.  Tobacco use.  Drug use.  Emotional well-being.  Home and relationship well-being.  Sexual activity.  Eating habits.  History of falls.  Memory and ability to understand (cognition).  Work and work Statistician. Screening  You may have the following tests or measurements:  Height, weight, and BMI.  Blood pressure.  Lipid and cholesterol levels. These may be checked every 5 years, or more frequently if you are over 73 years old.  Skin check.  Lung cancer screening. You may have this screening every year starting at age 47 if you have a 30-pack-year history of smoking and currently smoke or have quit within the past 15 years.  Fecal occult blood test (FOBT) of the stool. You may have this test every year starting at age 14.  Flexible sigmoidoscopy or colonoscopy. You may have a sigmoidoscopy every 5 years or a colonoscopy every 10 years starting at age 24.  Prostate cancer screening. Recommendations will vary depending on your family history and other risks.  Hepatitis C blood test.  Hepatitis B blood test.  Sexually transmitted disease (STD) testing.  Diabetes screening. This is done by checking your blood sugar (glucose) after you have not eaten for a while (fasting). You may have this done every 1-3 years.  Abdominal aortic aneurysm (AAA) screening. You may need this if you are a current or former smoker.  Osteoporosis. You may be screened starting at age 38 if you are at high risk. Talk with  your health care provider about your test results, treatment options, and if necessary, the need for more tests. Vaccines  Your health care provider may recommend certain vaccines, such as:  Influenza vaccine. This is recommended every year.  Tetanus, diphtheria, and  acellular pertussis (Tdap, Td) vaccine. You may need a Td booster every 10 years.  Zoster vaccine. You may need this after age 52.  Pneumococcal 13-valent conjugate (PCV13) vaccine. One dose is recommended after age 51.  Pneumococcal polysaccharide (PPSV23) vaccine. One dose is recommended after age 7. Talk to your health care provider about which screenings and vaccines you need and how often you need them. This information is not intended to replace advice given to you by your health care provider. Make sure you discuss any questions you have with your health care provider. Document Released: 02/18/2015 Document Revised: 10/12/2015 Document Reviewed: 11/23/2014 Elsevier Interactive Patient Education  2017 [place] Prevention in the Home Falls can cause injuries. They can happen to people of all ages. There are many things you can do to make your home safe and to help prevent falls. What can I do on the outside of my home?  Regularly fix the edges of walkways and driveways and fix any cracks.  Remove anything that might make you trip as you walk through a door, such as a raised step or threshold.  Trim any bushes or trees on the path to your home.  Use bright outdoor lighting.  Clear any walking paths of anything that might make someone trip, such as rocks or tools.  Regularly check to see if handrails are loose or broken. Make sure that both sides of any steps have handrails.  Any raised decks and porches should have guardrails on the edges.  Have any leaves, snow, or ice cleared regularly.  Use sand or salt on walking paths during winter.  Clean up any spills in your garage right away. This includes oil or grease spills. What can I do in the bathroom?  Use night lights.  Install grab bars by the toilet and in the tub and shower. Do not use towel bars as grab bars.  Use non-skid mats or decals in the tub or shower.  If you need to sit down in the shower, use  a plastic, non-slip stool.  Keep the floor dry. Clean up any water that spills on the floor as soon as it happens.  Remove soap buildup in the tub or shower regularly.  Attach bath mats securely with double-sided non-slip rug tape.  Do not have throw rugs and other things on the floor that can make you trip. What can I do in the bedroom?  Use night lights.  Make sure that you have a light by your bed that is easy to reach.  Do not use any sheets or blankets that are too big for your bed. They should not hang down onto the floor.  Have a firm chair that has side arms. You can use this for support while you get dressed.  Do not have throw rugs and other things on the floor that can make you trip. What can I do in the kitchen?  Clean up any spills right away.  Avoid walking on wet floors.  Keep items that you use a lot in easy-to-reach places.  If you need to reach something above you, use a strong step stool that has a grab bar.  Keep electrical cords out of the way.  Do not  use floor polish or wax that makes floors slippery. If you must use wax, use non-skid floor wax.  Do not have throw rugs and other things on the floor that can make you trip. What can I do with my stairs?  Do not leave any items on the stairs.  Make sure that there are handrails on both sides of the stairs and use them. Fix handrails that are broken or loose. Make sure that handrails are as long as the stairways.  Check any carpeting to make sure that it is firmly attached to the stairs. Fix any carpet that is loose or worn.  Avoid having throw rugs at the top or bottom of the stairs. If you do have throw rugs, attach them to the floor with carpet tape.  Make sure that you have a light switch at the top of the stairs and the bottom of the stairs. If you do not have them, ask someone to add them for you. What else can I do to help prevent falls?  Wear shoes that:  Do not have high heels.  Have  rubber bottoms.  Are comfortable and fit you well.  Are closed at the toe. Do not wear sandals.  If you use a stepladder:  Make sure that it is fully opened. Do not climb a closed stepladder.  Make sure that both sides of the stepladder are locked into place.  Ask someone to hold it for you, if possible.  Clearly mark and make sure that you can see:  Any grab bars or handrails.  First and last steps.  Where the edge of each step is.  Use tools that help you move around (mobility aids) if they are needed. These include:  Canes.  Walkers.  Scooters.  Crutches.  Turn on the lights when you go into a dark area. Replace any light bulbs as soon as they burn out.  Set up your furniture so you have a clear path. Avoid moving your furniture around.  If any of your floors are uneven, fix them.  If there are any pets around you, be aware of where they are.  Review your medicines with your doctor. Some medicines can make you feel dizzy. This can increase your chance of falling. Ask your doctor what other things that you can do to help prevent falls. This information is not intended to replace advice given to you by your health care provider. Make sure you discuss any questions you have with your health care provider. Document Released: 11/18/2008 Document Revised: 06/30/2015 Document Reviewed: 02/26/2014 Elsevier Interactive Patient Education  2017 [employer].

## 2019-09-23 NOTE — Progress Notes (Signed)
Subjective:   Tyrone Roman is a 74 y.o. male who presents for Medicare Annual/Subsequent preventive examination.   I connected with [name]  today by telephone and verified that I am speaking with the correct person using two identifiers. Location patient: home Location provider: work Persons participating in the virtual visit: patient, provider.   I discussed the limitations, risks, security and privacy concerns of performing an evaluation and management service by telephone and the availability of in person appointments. I also discussed with the patient that there may be a patient responsible charge related to this service. The patient expressed understanding and verbally consented to this telephonic visit.    Interactive audio and video telecommunications were attempted between this provider and patient, however failed, due to patient having technical difficulties OR patient did not have access to video capability.  We continued and completed visit with audio only.     Review of Systems    N/A Cardiac Risk Factors include: advanced age (>15men, >24 women);dyslipidemia;hypertension     Objective:    Today's Vitals   There is no height or weight on file to calculate BMI.  Advanced Directives 09/23/2019 04/14/2018 12/15/2017 12/12/2017 12/10/2017 12/06/2017 10/08/2017  Does Patient Have a Medical Advance Directive? Yes (No Data) - - No Yes Yes  Type of Paramedic of [place];Living will - - - - Living will [place];Living will  Does patient want to make changes to medical advance directive? No - Patient declined - - Yes (Inpatient - patient defers changing a medical advance directive at this time) - No - Patient declined No - Patient declined  Copy of [place] in Chart? No - copy requested - - - - - -  Would patient like information on creating a medical advance directive? - - No - Patient declined - - - -    Pre-existing out of facility DNR order (yellow form or pink MOST form) - - - - - - -    Current Medications (verified) Outpatient Encounter Medications as of 09/23/2019  Medication Sig  . acetaminophen (TYLENOL) 325 MG tablet Take 2 tablets (650 mg total) by mouth every 6 (six) hours as needed for mild pain, fever or headache.  Marland Kitchen aspirin EC 81 MG tablet Take 1 tablet (81 mg total) by mouth daily.  . Biotin 1 MG CAPS biotin  . cetirizine (ZYRTEC) 5 MG chewable tablet as needed.   Marland Kitchen MAGNESIUM PO Take by mouth.  . metoprolol succinate (TOPROL-XL) 25 MG 24 hr tablet Take 1 tablet (25 mg total) by mouth daily.  Marland Kitchen omeprazole (PRILOSEC) 40 MG capsule Take 1 capsule (40 mg total) by mouth daily.  . potassium chloride SA (KLOR-CON M20) 20 MEQ tablet Take 1 tablet (20 mEq total) by mouth daily.  . riTUXimab (RITUXAN) 500 MG/50ML injection Inject into the vein. Every 4 months  . sildenafil (REVATIO) 20 MG tablet TAKE ONE TABLET BY MOUTH DAILY AS NEEDED  . simvastatin (ZOCOR) 40 MG tablet TAKE ONE TABLET BY MOUTH DAILY  . tamsulosin (FLOMAX) 0.4 MG CAPS capsule Take 1 capsule (0.4 mg total) by mouth daily.  Marland Kitchen zolpidem (AMBIEN) 10 MG tablet TAKE ONE TABLET BY MOUTH EVERY NIGHT AT BEDTIME  . ciprofloxacin (CIPRO) 500 MG tablet Take 1 tablet (500 mg total) by mouth 2 (two) times daily. (Patient not taking: Reported on 09/23/2019)  . ciprofloxacin (CIPRO) 500 MG tablet Take 1 tablet (500 mg total) by mouth 2 (two) times  daily. (Patient not taking: Reported on 09/23/2019)  . ciprofloxacin (CIPRO) 500 MG tablet Take 1 tablet (500 mg total) by mouth 2 (two) times daily. (Patient not taking: Reported on 09/23/2019)  . ibuprofen (ADVIL) 200 MG tablet Take 800 mg by mouth every 6 (six) hours as needed. (Patient not taking: Reported on 09/23/2019)  . leflunomide (ARAVA) 20 MG tablet Take 20 mg by mouth daily. (Patient not taking: Reported on 09/23/2019)  . nitroGLYCERIN (NITROSTAT) 0.4 MG SL tablet Place 1 tablet (0.4  mg total) under the tongue every 5 (five) minutes as needed for chest pain (3 doses max). (Patient not taking: Reported on 09/23/2019)  . predniSONE (DELTASONE) 10 MG tablet Take 1 tablet (10 mg total) by mouth daily with breakfast. (Patient not taking: Reported on 09/23/2019)  . predniSONE (DELTASONE) 5 MG tablet Take 5 mg by mouth daily.  . traMADol (ULTRAM) 50 MG tablet Take 1-2 tablets (50-100 mg total) by mouth 3 (three) times daily as needed. (Patient not taking: Reported on 09/23/2019)   No facility-administered encounter medications on file as of 09/23/2019.    Allergies (verified) Cephalexin, Clarithromycin, Certolizumab pegol, Tocilizumab, Doxycycline, Hydroxyzine, Lidoderm, and Pyrithione zinc   History: Past Medical History:  Diagnosis Date  . Allergy   . Barrett's esophageal ulceration   . BPH (benign prostatic hyperplasia)   . CAD (coronary artery disease)    sees Dr. Mar Daring  . Cataract    both eyes removed   . Colonic polyp   . Concussion with loss of consciousness of 30 minutes or less   . Contact dermatitis   . Contact with or exposure to venereal diseases   . COPD (chronic obstructive pulmonary disease) ([place])    sees Dr. Kara Mead   . Diverticulosis of colon   . Dysphagia   . GERD (gastroesophageal reflux disease)    past hx- on meds   . HNP (herniated nucleus pulposus), lumbar    recurrent  . Hyperlipidemia   . Hypertension   . Laceration of scalp   . Myocardial infarction [facility])    2011- stent placed  . Nephrolithiasis   . Pre-operative cardiovascular examination   . Rheumatoid arthritis [facility])    sees Dr. Gavin Pound   . SOB (shortness of breath)    Past Surgical History:  Procedure Laterality Date  . BACK SURGERY    . CARDIAC CATHETERIZATION N/A 01/19/2016   Procedure: Left Heart Cath and Coronary Angiography;  Surgeon: Burnell Blanks, MD;  Location: [facility];  Service: Cardiovascular;  Laterality: N/A;  . COLONOSCOPY   12/09/2014   per Dr. Silverio Decamp, adenomatous polyps, repeat 3 years   . CORONARY ANGIOPLASTY WITH STENT PLACEMENT    . ESOPHAGOGASTRODUODENOSCOPY (EGD) WITH ESOPHAGEAL DILATION  02/17/09   Barretts esophagus   . EYE SURGERY    . KNEE ARTHROSCOPY Left X 2  . LUMBAR LAMINECTOMY/DECOMPRESSION MICRODISCECTOMY Right 10/07/2017   Procedure: RIGHT LUMBAR ONE-TWO LAMINECTOMY WITH MICRODISCECTOMY;  Surgeon: Consuella Lose, MD;  Location: [place];  Service: Neurosurgery;  Laterality: Right;  . LUMBAR LAMINECTOMY/DECOMPRESSION MICRODISCECTOMY N/A 12/06/2017   Procedure: RECURRENT MICRODISCECTOMY LUMBAR ONE - LUMBAR TWO;  Surgeon: Consuella Lose, MD;  Location: [place];  Service: Neurosurgery;  Laterality: N/A;  . LUMBAR WOUND DEBRIDEMENT N/A 12/11/2017   Procedure: LUMBAR WOUND DEBRIDEMENT;  Surgeon: Consuella Lose, MD;  Location: [place];  Service: Neurosurgery;  Laterality: N/A;  . POLYPECTOMY    . TONSILLECTOMY    . UPPER GASTROINTESTINAL ENDOSCOPY  Family History  Problem Relation Age of Onset  . Heart attack Father        cardiovascular disorder  . Arthritis Father        family hx  . Colon cancer Father        mets  . Prostate cancer Father        1st degree relative  . Arthritis Mother   . Colon polyps Mother   . Melanoma Mother   . Dementia Mother   . Diabetes Paternal Uncle   . Colon cancer Paternal Uncle   . Stomach cancer Paternal Uncle   . Melanoma Paternal Uncle   . Cancer Maternal Grandmother   . Skin cancer Daughter   . Colon polyps Sister   . Esophageal cancer Neg Hx   . Rectal cancer Neg Hx    Social History   Socioeconomic History  . Marital status: Divorced    Spouse name: Not on file  . Number of children: 4  . Years of education: Not on file  . Highest education level: Not on file  Occupational History  . Occupation: ENGINEER    Employer: [employer]  Tobacco Use  . Smoking status: Former Smoker    Packs/day: 1.00    Years: 20.00    Pack years:  20.00    Types: Cigarettes    Quit date: 10/12/1988    Years since quitting: 30.9  . Smokeless tobacco: Never Used  . Tobacco comment: 1960's   Vaping Use  . Vaping Use: Never used  Substance and Sexual Activity  . Alcohol use: Yes    Alcohol/week: 0.0 standard drinks    Comment: rare  . Drug use: No  . Sexual activity: Not on file  Other Topics Concern  . 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       04/14/2018:   Lives alone, has girlfriend who visits, main source of emotional support   Has been struggling with recent hospitalizations/ill health following sepsis and back surgeries   Social Determinants of Health   Financial Resource Strain: Low Risk   . Difficulty of Paying Living Expenses: Not hard at all  Food Insecurity: No Food Insecurity  . Worried About Charity fundraiser in the Last Year: Never true  . Ran Out of Food in the Last Year: Never true  Transportation Needs: No Transportation Needs  . Lack of Transportation (Medical): No  . Lack of Transportation (Non-Medical): No  Physical Activity: Insufficiently Active  . Days of Exercise per Week: 2 days  . Minutes of Exercise per Session: 60 min  Stress: Stress Concern Present  . Feeling of Stress : To some extent  Social Connections: Moderately Isolated  . Frequency of Communication with Friends and Family: More than three times a week  . Frequency of Social Gatherings with Friends and Family: Once a week  . Attends Religious Services: Never  . Active Member of Clubs or Organizations: Yes  . Attends Archivist Meetings: More than 4 times per year  . Marital Status: Divorced    Tobacco Counseling Counseling given: Not Answered Comment: 1960's    Clinical Intake:  Pre-visit preparation completed: Yes  Pain : No/denies pain     Nutritional Risks: None Diabetes: No  How often do you need to have someone  help you when you read instructions, pamphlets, or other written materials from your doctor or pharmacy?:  1 - Never What is the last grade level you completed in school?: 2 Years college  Diabetic?No  Interpreter Needed?: No  Information entered by :: [place] of Daily Living In your present state of health, do you have any difficulty performing the following activities: 09/23/2019  Hearing? Y  Comment Has tinnitus  Vision? N  Difficulty concentrating or making decisions? N  Walking or climbing stairs? N  Dressing or bathing? N  Doing errands, shopping? N  Preparing Food and eating ? N  Using the Toilet? N  In the past six months, have you accidently leaked urine? N  Do you have problems with loss of bowel control? N  Managing your Medications? N  Managing your Finances? N  Housekeeping or managing your Housekeeping? N  Some recent data might be hidden    Patient Care Team: [name], MD as PCP - General [place], Okey Regal, MD as Consulting Physician (Infectious Diseases) Gavin Pound, MD as Consulting Physician (Rheumatology) Consuella Lose, MD as Consulting Physician (Neurosurgery) Earnie Larsson, [facility] as Pharmacist (Pharmacist)  Indicate any recent Medical Services you may have received from other than [facility] providers in the past year (date may be approximate).     Assessment:   This is a routine wellness examination for [place].  Hearing/Vision screen  Hearing Screening   125Hz  250Hz  500Hz  1000Hz  2000Hz  3000Hz  4000Hz  6000Hz  8000Hz   Right ear:           Left ear:           Vision Screening Comments: Patient states gets eye checked annually   Dietary issues and exercise activities discussed: Current Exercise Habits: Home exercise routine, Type of exercise: walking, Time (Minutes): 60, Frequency (Times/Week): 2, Weekly Exercise (Minutes/Week): 120, Intensity: Mild, Exercise limited by: orthopedic condition(s)  Goals    . Patient Stated      Remain free from infection! Schedule back surgery for spring    . Patient Stated     I will continue to walk 2 miles 3x per week    . Pharmacy Care Plan     CARE PLAN ENTRY  Current Barriers:  . Chronic Disease Management support, education, and care coordination needs related to Hyperlipidemia, Coronary Artery Disease, GERD, and BPH, rheumatoid arthritis  Hyperlipidemia . Pharmacist Clinical Goal(s): o Over the next 180 days, patient will work with PharmD and providers to maintain LDL goal < 70 . Current regimen:  o Simvastatin 40mg , 1 tablet once daily . Interventions: . We discussed how a diet high in plant sterols (fruits/vegetables/nuts/whole grains/legumes) may reduce your cholesterol.  Encouraged increasing fiber to a daily intake of 10-25g/day  . Patient self care activities - Over the next 180 days, patient will: o Continue current medication  Coronary artery disease . Pharmacist Clinical Goal(s) o Over the next 180 days, patient will work with PharmD and providers to continue current medications . Current regimen:   Metoprolol succinate 25mg , 1 tablet once daily  Nitroglycerin 0.4mg  SL tablet, 1 tablet under tongue every five mintues as needed for chest pain (3 doses max) . Patient self care activities o Patient will continue current medications.   Rheumatoid arthritis . Pharmacist Clinical Goal(s) o Over the next 180 days, patient will work with PharmD and providers to continue current medications.  . Current regimen:   leflunomide (Arava) 20mg , 1 tablet once daily   Prednisone 10mg , 1 tablet daily with breakfast (only takes as needed)  Rituxan infusion every 4 month . Patient self  care activities o Patient will continue current medications   GERD . Pharmacist Clinical Goal(s) o Over the next 180 days, patient will work with PharmD and providers to Minimize reflux symptoms.  . Current regimen:  o Omeprazole 40mg ,1 capsule once daily  . Patient self care  activities o Patient will continue current medications.   BPH . Pharmacist Clinical Goal(s) o Over the next 180 days, patient will work with PharmD and providers to minimize symptoms associated with enlarged prostate . Current regimen:  o tamsulosin 0.4mg ,1 capsule once daily  . Patient self care activities o Patient will continue current medication.   Medication management . Pharmacist Clinical Goal(s): o Over the next 180 days, patient will work with PharmD and providers to maintain optimal medication adherence . [facility]   . Interventions o Comprehensive medication review performed. o Continue current medication management strategy . Patient self care activities - Over the next 180 days, patient will: o Take medications as prescribed o Report any questions or concerns to PharmD and/or provider(s)  Initial goal documentation     . Weight (lb) < 200 lb (90.7 kg)     Patient would like to lose at least 20 lbs        Depression Screen PHQ 2/9 Scores 09/23/2019 04/14/2018 03/11/2017 10/04/2015 06/16/2014  PHQ - 2 Score 0 2 0 0 0  PHQ- 9 Score 0 4 - - -    Fall Risk Fall Risk  09/23/2019 04/14/2018 03/11/2017 10/04/2015 06/16/2014  Falls in the past year? 1 1 No No No  Comment Tripped in a pothole and broke left hand - - - -  Number falls in past yr: 0 0 - - -  Injury with Fall? 1 0 - - -  Comment Broke left hand - - - -  Risk for fall due to : History of fall(s);Medication side effect Other (Comment);History of fall(s);Impaired vision - - -  Risk for fall due to: Comment - weakness from sepsis - - -  Follow up Falls evaluation completed;Falls prevention discussed Falls prevention discussed - - -    Any stairs in or around the home? Yes  If so, are there any without handrails? No  Home free of loose throw rugs in walkways, pet beds, electrical cords, etc? Yes  Adequate lighting in your home to reduce risk of falls? Yes   ASSISTIVE DEVICES UTILIZED TO PREVENT  FALLS:  Life alert? No  Use of a cane, walker or w/c? No  Grab bars in the bathroom? No  Shower chair or bench in shower? Yes  Elevated toilet seat or a handicapped toilet? Yes   Cognitive Function:     6CIT Screen 09/23/2019  What time? 0 points  Count back from 20 0 points  Months in reverse 2 points  Repeat phrase 0 points    Immunizations Immunization History  Administered Date(s) Administered  . Influenza Split 02/05/2012, 01/26/2013  . Influenza Whole 12/07/2002, 01/11/2009, 10/21/2009  . Influenza, High Dose Seasonal PF 10/04/2015, 12/05/2016, 11/17/2018  . Influenza,inj,Quad PF,6+ Mos 12/27/2014  . PFIZER SARS-COV-2 Vaccination 04/09/2019, 05/06/2019  . Pneumococcal Conjugate-13 10/04/2015  . Pneumococcal Polysaccharide-23 02/05/2001, 01/11/2009  . Td 02/05/2006    TDAP status: Due, Education has been provided regarding the importance of this vaccine. Advised may receive this vaccine at local pharmacy or Health Dept. Aware to provide a copy of the vaccination record if obtained from local pharmacy or Health Dept. Verbalized acceptance and understanding. Flu Vaccine status: Up  to date Pneumococcal vaccine status: Up to date Covid-19 vaccine status: Completed vaccines  Qualifies for Shingles Vaccine? Yes   Zostavax completed No   Shingrix Completed?: No.    Education has been provided regarding the importance of this vaccine. Patient has been advised to call insurance company to determine out of pocket expense if they have not yet received this vaccine. Advised may also receive vaccine at local pharmacy or Health Dept. Verbalized acceptance and understanding.  Screening Tests Health Maintenance  Topic Date Due  . Hepatitis C Screening  Never done  . TETANUS/TDAP  02/06/2016  . PNA vac Low Risk Adult (2 of 2 - PPSV23) 10/03/2016  . INFLUENZA VACCINE  09/06/2019  . COLONOSCOPY  06/19/2022  . COVID-19 Vaccine  Completed    Health Maintenance  Health Maintenance  Due  Topic Date Due  . Hepatitis C Screening  Never done  . TETANUS/TDAP  02/06/2016  . PNA vac Low Risk Adult (2 of 2 - PPSV23) 10/03/2016  . INFLUENZA VACCINE  09/06/2019    Colorectal cancer screening: Completed 06/19/2019. Repeat every 10 years  Lung Cancer Screening: (Low Dose CT Chest recommended if Age 65-80 years, 30 pack-year currently smoking OR have quit w/in 15years.) does not qualify.   Lung Cancer Screening Referral: N/A  Additional Screening:  Hepatitis C Screening: does qualify;   Vision Screening: Recommended annual ophthalmology exams for early detection of glaucoma and other disorders of the eye. Is the patient up to date with their annual eye exam?  Yes  Who is the provider or what is the name of the office in which the patient attends annual eye exams? Dr. Herbert Deaner If pt is not established with a provider, would they like to be referred to a provider to establish care? No .   Dental Screening: Recommended annual dental exams for proper oral hygiene  Community Resource Referral / Chronic Care Management: CRR required this visit?  No   CCM required this visit?  No      Plan:     I have personally reviewed and noted the following in the patient's chart:   . Medical and social history . Use of alcohol, tobacco or illicit drugs  . Current medications and supplements . Functional ability and status . Nutritional status . Physical activity . Advanced directives . List of other physicians . Hospitalizations, surgeries, and ER visits in previous 12 months . Vitals . Screenings to include cognitive, depression, and falls . Referrals and appointments  In addition, I have reviewed and discussed with patient certain preventive protocols, quality metrics, and best practice recommendations. A written personalized care plan for preventive services as well as general preventive health recommendations were provided to patient.     [name], LPN   [id number]     Nurse Notes: None

## 2019-09-24 DIAGNOSIS — M79642 Pain in left hand: Secondary | ICD-10-CM | POA: Diagnosis not present

## 2019-09-28 ENCOUNTER — Other Ambulatory Visit: Payer: Self-pay | Admitting: Family Medicine

## 2019-09-29 DIAGNOSIS — Z23 Encounter for immunization: Secondary | ICD-10-CM | POA: Diagnosis not present

## 2019-10-01 DIAGNOSIS — M79642 Pain in left hand: Secondary | ICD-10-CM | POA: Diagnosis not present

## 2019-10-01 DIAGNOSIS — S62323S Displaced fracture of shaft of third metacarpal bone, left hand, sequela: Secondary | ICD-10-CM | POA: Diagnosis not present

## 2019-10-08 DIAGNOSIS — M79642 Pain in left hand: Secondary | ICD-10-CM | POA: Diagnosis not present

## 2019-10-08 DIAGNOSIS — S62323S Displaced fracture of shaft of third metacarpal bone, left hand, sequela: Secondary | ICD-10-CM | POA: Diagnosis not present

## 2019-10-13 DIAGNOSIS — M0579 Rheumatoid arthritis with rheumatoid factor of multiple sites without organ or systems involvement: Secondary | ICD-10-CM | POA: Diagnosis not present

## 2019-10-19 DIAGNOSIS — S62323S Displaced fracture of shaft of third metacarpal bone, left hand, sequela: Secondary | ICD-10-CM | POA: Diagnosis not present

## 2019-10-19 DIAGNOSIS — M79642 Pain in left hand: Secondary | ICD-10-CM | POA: Diagnosis not present

## 2019-10-27 DIAGNOSIS — M0579 Rheumatoid arthritis with rheumatoid factor of multiple sites without organ or systems involvement: Secondary | ICD-10-CM | POA: Diagnosis not present

## 2019-10-29 DIAGNOSIS — H04123 Dry eye syndrome of bilateral lacrimal glands: Secondary | ICD-10-CM | POA: Diagnosis not present

## 2019-10-29 DIAGNOSIS — H35033 Hypertensive retinopathy, bilateral: Secondary | ICD-10-CM | POA: Diagnosis not present

## 2019-10-29 DIAGNOSIS — Z961 Presence of intraocular lens: Secondary | ICD-10-CM | POA: Diagnosis not present

## 2019-10-29 DIAGNOSIS — H353132 Nonexudative age-related macular degeneration, bilateral, intermediate dry stage: Secondary | ICD-10-CM | POA: Diagnosis not present

## 2019-11-11 DIAGNOSIS — S62323S Displaced fracture of shaft of third metacarpal bone, left hand, sequela: Secondary | ICD-10-CM | POA: Diagnosis not present

## 2019-11-11 DIAGNOSIS — M79642 Pain in left hand: Secondary | ICD-10-CM | POA: Diagnosis not present

## 2019-11-19 DIAGNOSIS — S62323S Displaced fracture of shaft of third metacarpal bone, left hand, sequela: Secondary | ICD-10-CM | POA: Diagnosis not present

## 2019-11-19 DIAGNOSIS — M79642 Pain in left hand: Secondary | ICD-10-CM | POA: Diagnosis not present

## 2019-11-24 DIAGNOSIS — M79642 Pain in left hand: Secondary | ICD-10-CM | POA: Diagnosis not present

## 2019-11-24 DIAGNOSIS — S62323S Displaced fracture of shaft of third metacarpal bone, left hand, sequela: Secondary | ICD-10-CM | POA: Diagnosis not present

## 2019-11-25 ENCOUNTER — Telehealth: Payer: Medicare Other

## 2019-11-27 ENCOUNTER — Ambulatory Visit: Payer: Medicare Other | Admitting: Pharmacist

## 2019-11-27 DIAGNOSIS — N138 Other obstructive and reflux uropathy: Secondary | ICD-10-CM

## 2019-11-27 DIAGNOSIS — E782 Mixed hyperlipidemia: Secondary | ICD-10-CM

## 2019-11-27 DIAGNOSIS — N401 Enlarged prostate with lower urinary tract symptoms: Secondary | ICD-10-CM

## 2019-11-27 NOTE — Chronic Care Management (AMB) (Signed)
Chronic Care Management Pharmacy  Name: Tyrone Roman  MRN: [id number] DOB: 1945-12-06   Initial Questions: 1. Have you seen any other providers since your last visit? NA 2. Any changes in your medicines or health? No   Chief Complaint/ HPI  Tyrone Roman,  74 y.o. , male presents for their Follow-Up CCM visit with the clinical pharmacist via telephone due to COVID-19 Pandemic.  PCP : [name], MD  Their chronic conditions include: HLD, CAD, RA, GERD/ Barretts esophagus, BPH  Office Visits: 09/23/19 [name], LPN: Patient presented for Medicare annual wellness visit.  08/24/19 [name], MD: Patient presented with dysuria. His urinalysis was positive for protein and leukocytes. Restarted Cipro 500 mg BID x 10 days. Follow up if worsening symptoms.  05/11/2019- [name], MD- Patient presented for office visit for complaint of 3 weeks of urinary frequency and urgency. Patient has recurrent prostate infections. Patient to obtain fasting labs (lipids and A1c). Patient treated with Cipro for another prostatitis. Patient is past due for colonoscopy. Patient referred to dermatology for a skin check.   Consult Visit: 09/16/19 [name], MD (otolaryngology): Patient presented for hearing loss of both ears and tinnitus. Patient would be a candidate for hearing aids if he elects to use these as this can help tinnitus.   07/07/19 [name], MD (dermatology): Patient presented for skin biopsy of right inferior helix and right upper arm. Pathology results can back as precancerous.   03/18/2019- Sports Medicine- Clearance [name], MD- Patient had office visit for fracture of left hand. Initial injury on 1/21. Limited improvement and some blistering between fingers and palm. Patient counseled on skin care and referred to orthopedics.   Medications: Outpatient Encounter Medications as of 11/27/2019  Medication Sig  . acetaminophen (TYLENOL) 325 MG tablet Take 2 tablets  (650 mg total) by mouth every 6 (six) hours as needed for mild pain, fever or headache.  Marland Kitchen aspirin EC 81 MG tablet Take 1 tablet (81 mg total) by mouth daily.  . Biotin 1 MG CAPS biotin  . cetirizine (ZYRTEC) 5 MG chewable tablet as needed.   . ciprofloxacin (CIPRO) 500 MG tablet Take 1 tablet (500 mg total) by mouth 2 (two) times daily. (Patient not taking: Reported on 09/23/2019)  . ciprofloxacin (CIPRO) 500 MG tablet Take 1 tablet (500 mg total) by mouth 2 (two) times daily. (Patient not taking: Reported on 09/23/2019)  . ciprofloxacin (CIPRO) 500 MG tablet Take 1 tablet (500 mg total) by mouth 2 (two) times daily. (Patient not taking: Reported on 09/23/2019)  . ibuprofen (ADVIL) 200 MG tablet Take 800 mg by mouth every 6 (six) hours as needed. (Patient not taking: Reported on 09/23/2019)  . leflunomide (ARAVA) 20 MG tablet Take 20 mg by mouth daily. (Patient not taking: Reported on 09/23/2019)  . MAGNESIUM PO Take by mouth.  . metoprolol succinate (TOPROL-XL) 25 MG 24 hr tablet Take 1 tablet (25 mg total) by mouth daily.  . nitroGLYCERIN (NITROSTAT) 0.4 MG SL tablet Place 1 tablet (0.4 mg total) under the tongue every 5 (five) minutes as needed for chest pain (3 doses max). (Patient not taking: Reported on 09/23/2019)  . omeprazole (PRILOSEC) 40 MG capsule Take 1 capsule (40 mg total) by mouth daily.  . potassium chloride SA (KLOR-CON M20) 20 MEQ tablet Take 1 tablet (20 mEq total) by mouth daily.  . predniSONE (DELTASONE) 10 MG tablet Take 1 tablet (10 mg total) by mouth daily with breakfast. (Patient not taking: Reported on  09/23/2019)  . predniSONE (DELTASONE) 5 MG tablet Take 5 mg by mouth daily.  . riTUXimab (RITUXAN) 500 MG/50ML injection Inject into the vein. Every 4 months  . sildenafil (REVATIO) 20 MG tablet TAKE ONE TABLET BY MOUTH DAILY AS NEEDED  . simvastatin (ZOCOR) 40 MG tablet TAKE ONE TABLET BY MOUTH DAILY  . tamsulosin (FLOMAX) 0.4 MG CAPS capsule Take 1 capsule (0.4 mg total) by  mouth daily.  . traMADol (ULTRAM) 50 MG tablet Take 1-2 tablets (50-100 mg total) by mouth 3 (three) times daily as needed. (Patient not taking: Reported on 09/23/2019)  . zolpidem (AMBIEN) 10 MG tablet TAKE ONE TABLET BY MOUTH EVERY NIGHT AT BEDTIME   No facility-administered encounter medications on file as of 11/27/2019.     Current Diagnosis/Assessment:  Goals Addressed            This Visit's Progress   . Pharmacy Care Plan       CARE PLAN ENTRY  Current Barriers:  . Chronic Disease Management support, education, and care coordination needs related to Hyperlipidemia, Coronary Artery Disease, GERD, and BPH, rheumatoid arthritis  Hyperlipidemia . Pharmacist Clinical Goal(s): o Over the next 180 days, patient will work with PharmD and providers to maintain LDL goal < 70 . Current regimen:  o Simvastatin 40mg , 1 tablet once daily . Interventions: . We discussed how a diet high in plant sterols (fruits/vegetables/nuts/whole grains/legumes) may reduce your cholesterol.  Encouraged increasing fiber to a daily intake of 10-25g/day  . Patient self care activities - Over the next 180 days, patient will: o Continue current medication  Coronary artery disease . Pharmacist Clinical Goal(s) o Over the next 180 days, patient will work with PharmD and providers to continue current medications . Current regimen:   Metoprolol succinate 25mg , 1 tablet once daily  Nitroglycerin 0.4mg  SL tablet, 1 tablet under tongue every five mintues as needed for chest pain (3 doses max) . Patient self care activities o Patient will continue current medications.   Rheumatoid arthritis . Pharmacist Clinical Goal(s) o Over the next 180 days, patient will work with PharmD and providers to continue current medications.  . Current regimen:   leflunomide (Arava) 20mg , 1 tablet once daily   Prednisone 10mg , 1 tablet daily with breakfast (only takes as needed)  Rituxan infusion every 4 month . Patient  self care activities o Patient will continue current medications   GERD . Pharmacist Clinical Goal(s) o Over the next 180 days, patient will work with PharmD and providers to Minimize reflux symptoms.  . Current regimen:  o Omeprazole 40mg ,1 capsule once daily  . Patient self care activities o Patient will continue current medications.   BPH . Pharmacist Clinical Goal(s) o Over the next 90 days, patient will work with PharmD and providers to minimize symptoms associated with enlarged prostate . Current regimen:  o tamsulosin 0.4mg ,1 capsule once daily  . Interventions: o Dr. Sarajane Jews increased to tamsulosin 0.8 mg daily . Patient self care activities o Patient will start taking 2 capsules of tamsulosin 0.4 mg daily  Insomnia . Pharmacist Clinical Goal(s) o Over the next 90 days, patient will work with PharmD and providers to improve sleep . Current regimen:  o Zolpidem 10 mg , 1/2 to 1 tablet at bedtime . Interventions: o Discussed practicing good sleep hygiene by setting a sleep schedule and maintaining it, avoid excessive napping, following a nightly routine, avoiding screen time for 30-60 minutes before going to bed, and making the bedroom a cool, quiet  and dark space . Patient self care activities o Patient will continue current medication  Medication management . Pharmacist Clinical Goal(s): o Over the next 180 days, patient will work with PharmD and providers to maintain optimal medication adherence . [facility]   . Interventions o Comprehensive medication review performed. o Continue current medication management strategy . Patient self care activities - Over the next 180 days, patient will: o Take medications as prescribed o Report any questions or concerns to PharmD and/or provider(s)  Please see past updates related to this goal by clicking on the "Past Updates" button in the selected goal          Hyperlipidemia  LDL goal < 70  Lipid  Panel     Component Value Date/Time   CHOL 158 05/11/2019 1334   TRIG 139.0 05/11/2019 1334   HDL 47.70 05/11/2019 1334   CHOLHDL 3 05/11/2019 1334   VLDL 27.8 05/11/2019 1334   LDLCALC 83 05/11/2019 1334    The ASCVD Risk score (Goff DC Jr., et al., 2013) failed to calculate for the following reasons:   The patient has a prior MI or stroke diagnosis   Patient has failed these meds in past: none  Patient is currently controlled on the following medications:   Simvastatin 40mg , 1 tablet once daily   We discussed:  diet and exercise extensively -Diet: patient rarely eats canned or frozen foods; he enjoys stirfrying chicken and eats plenty of fiber Lowering cholesterol through diet by: Marland Kitchen Limiting foods with cholesterol such as liver and other organ meats, egg yolks, shrimp, and whole milk dairy products . Avoiding saturated fats and trans fats and incorporating healthier fats, such as lean meat, nuts, and unsaturated oils like canola and olive oils . Eating foods with soluble fiber such as whole-grain cereals such as oatmeal and oat bran, fruits such as apples, bananas, oranges, pears, and prunes, legumes such as kidney beans, lentils, chick peas, black-eyed peas, and lima beans, and green leafy vegetables . Limiting alcohol intake -Exercises: patient started waking 3 x a week, 3/4 to 1.5 miles over the past 6 months  Plan Continue current medications  CAD   Patient reported never needing to use nitroglycerin. Denies chest pain.   Patient has failed these meds in past: none Patient is currently controlled on the following medications:   Metoprolol succinate 25mg , 1 tablet once daily   Nitroglycerin 0.4mg  SL tablet, 1 tablet under tongue every five mintues as needed for chest pain (3 doses max)  We discussed:   diet: changes all the time. Tries to watch what he eats, keeping low carbs and protein -Diet: patient uses sea salt and does not use much  Plan Continue current  medications  Denies dizziness/lightheadedness.  Rheumatoid arthritis    Patient is currently controlled on the following medications:   leflunomide (Arava) 20mg , 1 tablet once daily   Prednisone 5mg , 1 tablet daily   Rituxan infusion every 4 months (infusion 1 or 2 weeks later)   Plan Continue current medications   GERD/ Barrett's esophagus   Patient is currently controlled on the following medications:   Omeprazole 40mg ,1 capsule once daily   Plan Continue current medications  BPH   Patient is currently controlled on the following medications:   tamsulosin 0.4mg , 1 capsule once daily   We discussed: patient reports he still has a weak urine flow   Plan Discussed with Dr. Sarajane Jews about increasing dose of tamsulosin to 0.8 mg - will  request a new prescription.   Insomnia   Patient has failed these meds in past: none Patient is currently controlled on the following medications:  Marland Kitchen Zolpidem 10 mg (1/2 tablet - 1 tablet) every night  We discussed:  Practicing good sleep hygiene by setting a sleep schedule and maintaining it, avoid excessive napping, following a nightly routine, avoiding screen time for 30-60 minutes before going to bed, and making the bedroom a cool, quiet and dark space  Plan  Continue current medications   OTC/ supplements     APAP 325mg , 2 tablets every six hours as needed for mild pain, fever or headache   Biotin 1mg   Cetrizine 5mg , 1 tablet as needed - seasonally    Medication Management  Patient organizes medications: weekly pack.  [facility]   Adherence: [facility] confirmed fills not on dispense report - dispense report not accurate for [facility]   Follow up: Follow up visit with PharmD in 3 months for BPH and insomnia assessment    Jeni Salles, PharmD Clinical Pharmacist [facility] [place] [facility] [place] [phone]

## 2019-12-04 NOTE — Patient Instructions (Signed)
Visit Information  Goals Addressed            This Visit's Progress   . Pharmacy Care Plan       CARE PLAN ENTRY  Current Barriers:  . Chronic Disease Management support, education, and care coordination needs related to Hyperlipidemia, Coronary Artery Disease, GERD, and BPH, rheumatoid arthritis  Hyperlipidemia . Pharmacist Clinical Goal(s): o Over the next 180 days, patient will work with PharmD and providers to maintain LDL goal < 70 . Current regimen:  o Simvastatin 40mg , 1 tablet once daily . Interventions: . We discussed how a diet high in plant sterols (fruits/vegetables/nuts/whole grains/legumes) may reduce your cholesterol.  Encouraged increasing fiber to a daily intake of 10-25g/day  . Patient self care activities - Over the next 180 days, patient will: o Continue current medication  Coronary artery disease . Pharmacist Clinical Goal(s) o Over the next 180 days, patient will work with PharmD and providers to continue current medications . Current regimen:   Metoprolol succinate 25mg , 1 tablet once daily  Nitroglycerin 0.4mg  SL tablet, 1 tablet under tongue every five mintues as needed for chest pain (3 doses max) . Patient self care activities o Patient will continue current medications.   Rheumatoid arthritis . Pharmacist Clinical Goal(s) o Over the next 180 days, patient will work with PharmD and providers to continue current medications.  . Current regimen:   leflunomide (Arava) 20mg , 1 tablet once daily   Prednisone 10mg , 1 tablet daily with breakfast (only takes as needed)  Rituxan infusion every 4 month . Patient self care activities o Patient will continue current medications   GERD . Pharmacist Clinical Goal(s) o Over the next 180 days, patient will work with PharmD and providers to Minimize reflux symptoms.  . Current regimen:  o Omeprazole 40mg ,1 capsule once daily  . Patient self care activities o Patient will continue current medications.    BPH . Pharmacist Clinical Goal(s) o Over the next 90 days, patient will work with PharmD and providers to minimize symptoms associated with enlarged prostate . Current regimen:  o tamsulosin 0.4mg ,1 capsule once daily  . Interventions: o Dr. Sarajane Jews increased to tamsulosin 0.8 mg daily . Patient self care activities o Patient will start taking 2 capsules of tamsulosin 0.4 mg daily  Insomnia . Pharmacist Clinical Goal(s) o Over the next 90 days, patient will work with PharmD and providers to improve sleep . Current regimen:  o Zolpidem 10 mg , 1/2 to 1 tablet at bedtime . Interventions: o Discussed practicing good sleep hygiene by setting a sleep schedule and maintaining it, avoid excessive napping, following a nightly routine, avoiding screen time for 30-60 minutes before going to bed, and making the bedroom a cool, quiet and dark space . Patient self care activities o Patient will continue current medication  Medication management . Pharmacist Clinical Goal(s): o Over the next 180 days, patient will work with PharmD and providers to maintain optimal medication adherence . [facility]   . Interventions o Comprehensive medication review performed. o Continue current medication management strategy . Patient self care activities - Over the next 180 days, patient will: o Take medications as prescribed o Report any questions or concerns to PharmD and/or provider(s)  Please see past updates related to this goal by clicking on the "Past Updates" button in the selected goal         The patient verbalized understanding of instructions provided today and declined a print copy of patient instruction  materials.   Telephone follow up appointment with pharmacy team member scheduled for: 3 months

## 2019-12-07 ENCOUNTER — Telehealth: Payer: Self-pay | Admitting: Family Medicine

## 2019-12-07 MED ORDER — TAMSULOSIN HCL 0.4 MG PO CAPS: 0.8000 mg | ORAL_CAPSULE | Freq: Every day | ORAL | 3 refills | Status: DC

## 2019-12-07 NOTE — Telephone Encounter (Signed)
-----   Message from Viona Gilmore, [facility] sent at 12/04/2019  3:17 PM EDT ----- Regarding: Tamsulosin Hi,  From our discussion last week about Mr. Saidi's tamsulosin, can you send in an updated prescription to 2 capsules daily? I will call him as soon as the new prescription is sent in so he is aware of the plan.  Thank you for your help!  Best, Maddie

## 2019-12-07 NOTE — Telephone Encounter (Signed)
Done

## 2019-12-14 DIAGNOSIS — M79642 Pain in left hand: Secondary | ICD-10-CM | POA: Diagnosis not present

## 2019-12-14 DIAGNOSIS — S62323S Displaced fracture of shaft of third metacarpal bone, left hand, sequela: Secondary | ICD-10-CM | POA: Diagnosis not present

## 2019-12-17 ENCOUNTER — Other Ambulatory Visit: Payer: Self-pay | Admitting: Family Medicine

## 2019-12-17 NOTE — Telephone Encounter (Signed)
Received refill request for:  Zolpidem 10 mg LR 08/07/18, #30, 4 rf's LOV 05/11/19 FOV none scheduled.    Sildenafil 20 mg LR 09/29/19, #30, 0 rf's  Please review and advise.  Thanks.   Dm/cma

## 2019-12-18 DIAGNOSIS — Z23 Encounter for immunization: Secondary | ICD-10-CM | POA: Diagnosis not present

## 2019-12-22 DIAGNOSIS — M79642 Pain in left hand: Secondary | ICD-10-CM | POA: Diagnosis not present

## 2019-12-22 DIAGNOSIS — S62323S Displaced fracture of shaft of third metacarpal bone, left hand, sequela: Secondary | ICD-10-CM | POA: Diagnosis not present

## 2020-01-22 DIAGNOSIS — R5383 Other fatigue: Secondary | ICD-10-CM | POA: Diagnosis not present

## 2020-01-22 DIAGNOSIS — M0579 Rheumatoid arthritis with rheumatoid factor of multiple sites without organ or systems involvement: Secondary | ICD-10-CM | POA: Diagnosis not present

## 2020-01-22 DIAGNOSIS — Z79899 Other long term (current) drug therapy: Secondary | ICD-10-CM | POA: Diagnosis not present

## 2020-01-22 DIAGNOSIS — M150 Primary generalized (osteo)arthritis: Secondary | ICD-10-CM | POA: Diagnosis not present

## 2020-01-22 DIAGNOSIS — M2550 Pain in unspecified joint: Secondary | ICD-10-CM | POA: Diagnosis not present

## 2020-01-22 DIAGNOSIS — Z6829 Body mass index (BMI) 29.0-29.9, adult: Secondary | ICD-10-CM | POA: Diagnosis not present

## 2020-01-22 DIAGNOSIS — E663 Overweight: Secondary | ICD-10-CM | POA: Diagnosis not present

## 2020-02-12 IMAGING — CR DG LUMBAR SPINE 1V
1 series · 1 of 1 positions shown · non-contrast
Comparison: MRI 12/05/2017

CLINICAL DATA: Microdiskectomy L1-L2.

EXAM:
LUMBAR SPINE - 1 VIEW

[lateral]
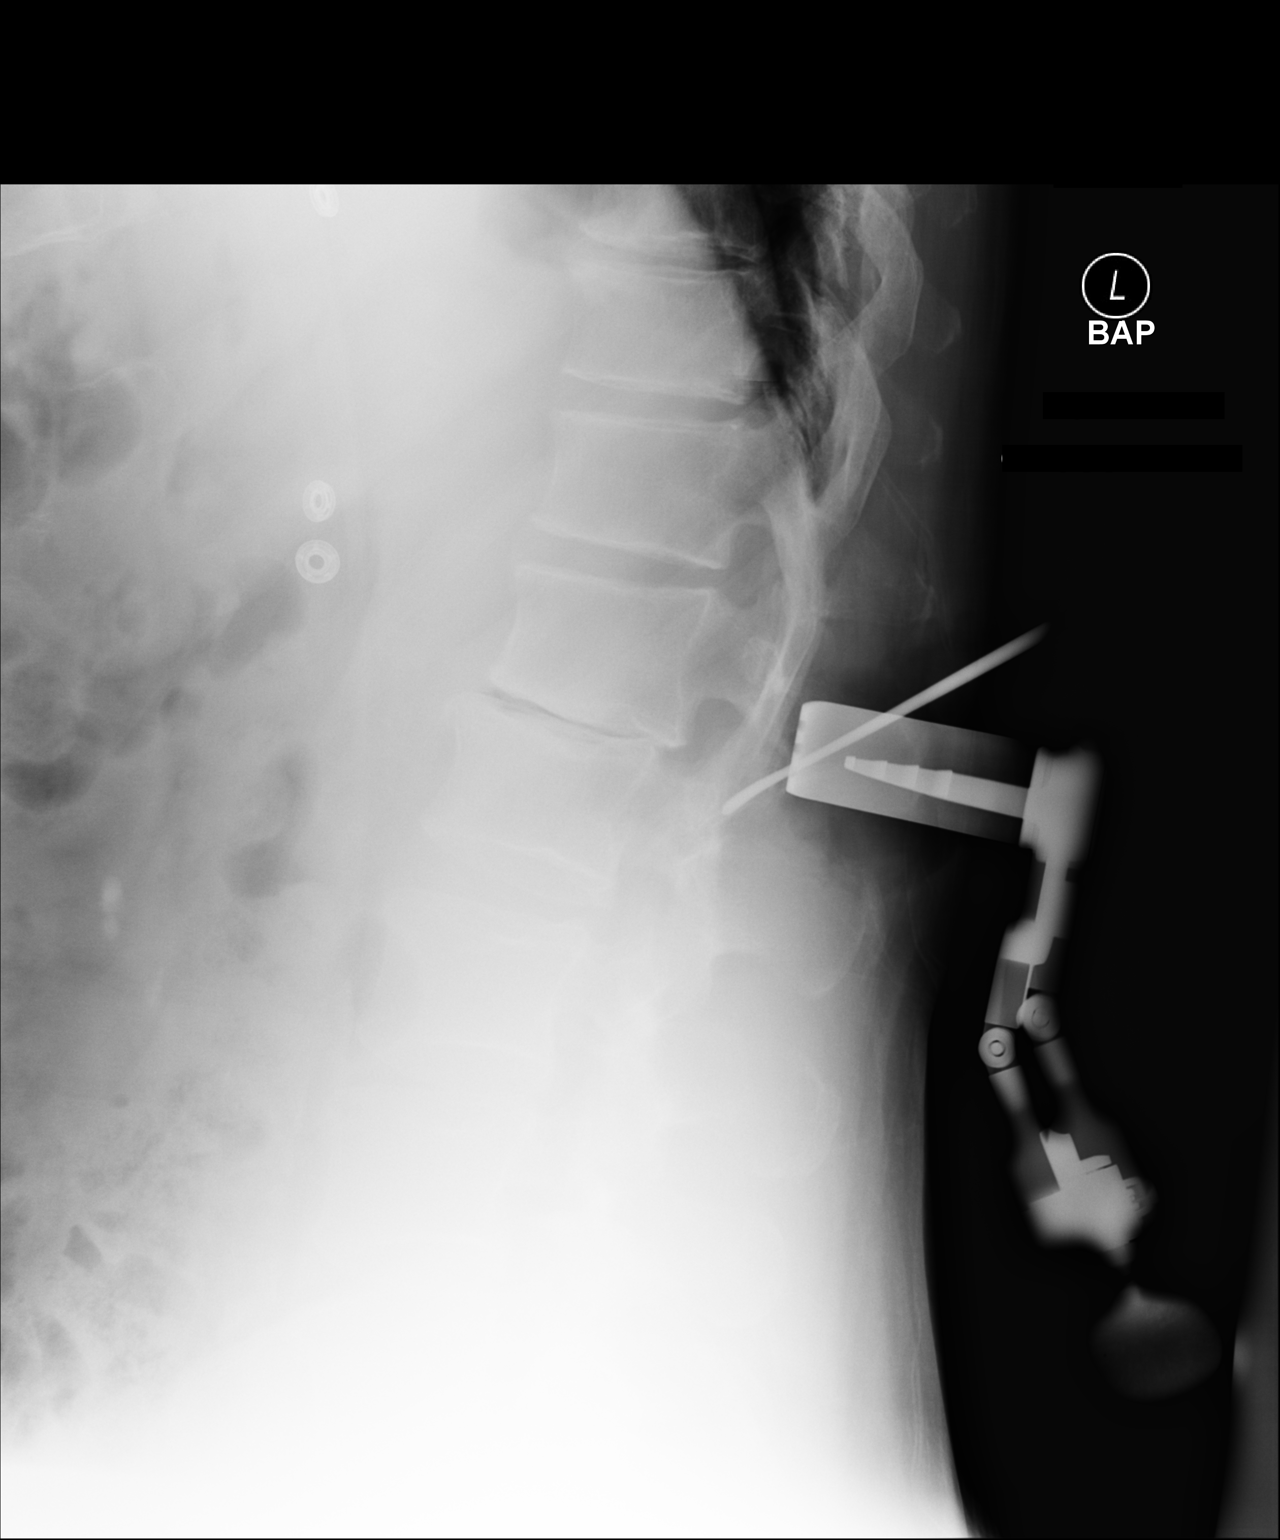

[1 of 1 positions shown; findings below may reference images not displayed]

FINDINGS: Single cross-table lateral view of the spine demonstrates surgical
instrument at the level of L2 spinous process. Under penetrated
radiograph prevents evaluation of the lower lumbosacral spine.
IMPRESSION: Surgical instrument at the level of L2 spinous process.

## 2020-02-16 DIAGNOSIS — M0579 Rheumatoid arthritis with rheumatoid factor of multiple sites without organ or systems involvement: Secondary | ICD-10-CM | POA: Diagnosis not present

## 2020-02-22 ENCOUNTER — Other Ambulatory Visit: Payer: Self-pay

## 2020-02-22 ENCOUNTER — Emergency Department (HOSPITAL_COMMUNITY): Payer: Medicare Other

## 2020-02-22 ENCOUNTER — Emergency Department (HOSPITAL_COMMUNITY)
Admission: EM | Admit: 2020-02-22 | Discharge: 2020-02-23 | Disposition: A | Discharge status: Home / Self Care | Payer: Medicare Other | Attending: Emergency Medicine | Admitting: Emergency Medicine

## 2020-02-22 ENCOUNTER — Encounter (HOSPITAL_COMMUNITY): Payer: Self-pay

## 2020-02-22 DIAGNOSIS — Z79899 Other long term (current) drug therapy: Secondary | ICD-10-CM | POA: Diagnosis not present

## 2020-02-22 DIAGNOSIS — R509 Fever, unspecified: Secondary | ICD-10-CM | POA: Diagnosis not present

## 2020-02-22 DIAGNOSIS — J449 Chronic obstructive pulmonary disease, unspecified: Secondary | ICD-10-CM | POA: Insufficient documentation

## 2020-02-22 DIAGNOSIS — Z87891 Personal history of nicotine dependence: Secondary | ICD-10-CM | POA: Insufficient documentation

## 2020-02-22 DIAGNOSIS — I10 Essential (primary) hypertension: Secondary | ICD-10-CM | POA: Insufficient documentation

## 2020-02-22 DIAGNOSIS — U071 COVID-19: Secondary | ICD-10-CM | POA: Diagnosis not present

## 2020-02-22 DIAGNOSIS — R0789 Other chest pain: Secondary | ICD-10-CM | POA: Diagnosis not present

## 2020-02-22 DIAGNOSIS — R079 Chest pain, unspecified: Secondary | ICD-10-CM | POA: Diagnosis not present

## 2020-02-22 DIAGNOSIS — Z86010 Personal history of colonic polyps: Secondary | ICD-10-CM | POA: Insufficient documentation

## 2020-02-22 DIAGNOSIS — Z955 Presence of coronary angioplasty implant and graft: Secondary | ICD-10-CM | POA: Diagnosis not present

## 2020-02-22 DIAGNOSIS — I25118 Atherosclerotic heart disease of native coronary artery with other forms of angina pectoris: Secondary | ICD-10-CM | POA: Insufficient documentation

## 2020-02-22 DIAGNOSIS — Z7982 Long term (current) use of aspirin: Secondary | ICD-10-CM | POA: Diagnosis not present

## 2020-02-22 DIAGNOSIS — R197 Diarrhea, unspecified: Secondary | ICD-10-CM | POA: Diagnosis not present

## 2020-02-22 LAB — CBC
HCT: 45.6 % (ref 39.0–52.0)
Hemoglobin: 15.3 g/dL (ref 13.0–17.0)
MCH: 30 pg (ref 26.0–34.0)
MCHC: 33.6 g/dL (ref 30.0–36.0)
MCV: 89.4 fL (ref 80.0–100.0)
Platelets: 92 10*3/uL — ABNORMAL LOW (ref 150–400)
RBC: 5.1 MIL/uL (ref 4.22–5.81)
RDW: 13.5 % (ref 11.5–15.5)
WBC: 8 10*3/uL (ref 4.0–10.5)
nRBC: 0 % (ref 0.0–0.2)

## 2020-02-22 LAB — BASIC METABOLIC PANEL
Anion gap: 11 (ref 5–15)
BUN: 25 mg/dL — ABNORMAL HIGH (ref 8–23)
CO2: 23 mmol/L (ref 22–32)
Calcium: 9.2 mg/dL (ref 8.9–10.3)
Chloride: 103 mmol/L (ref 98–111)
Creatinine, Ser: 1.61 mg/dL — ABNORMAL HIGH (ref 0.61–1.24)
GFR, Estimated: 45 mL/min — ABNORMAL LOW (ref >60)
Glucose, Bld: 123 mg/dL — ABNORMAL HIGH (ref 70–99)
Potassium: 4.1 mmol/L (ref 3.5–5.1)
Sodium: 137 mmol/L (ref 135–145)

## 2020-02-22 LAB — TROPONIN I (HIGH SENSITIVITY): Troponin I (High Sensitivity): 9 ng/L (ref <18)

## 2020-02-22 NOTE — ED Triage Notes (Signed)
Pt reports that he tested covid+ on 1/12 and having CP/SOB, fever, diarrhea, pt is vaccinated

## 2020-02-23 ENCOUNTER — Encounter (HOSPITAL_COMMUNITY): Payer: Self-pay | Admitting: Emergency Medicine

## 2020-02-23 DIAGNOSIS — U071 COVID-19: Secondary | ICD-10-CM | POA: Diagnosis not present

## 2020-02-23 LAB — TROPONIN I (HIGH SENSITIVITY): Troponin I (High Sensitivity): 9 ng/L (ref <18)

## 2020-02-23 MED ORDER — PREDNISONE 10 MG PO TABS: 10.0000 mg | ORAL_TABLET | Freq: Every day | ORAL | 2 refills | Status: AC

## 2020-02-23 NOTE — ED Notes (Signed)
Pt tolerated fluid intake.

## 2020-02-23 NOTE — ED Notes (Addendum)
Oxygen saturation at rest 96% on room air. Oxygen saturation with ambulation 97% on room air. Pt did complalin of getting dizzy. Will do orthostatic vitals.

## 2020-02-23 NOTE — Discharge Instructions (Signed)
You have been seen and discharged from the emergency department.  Follow-up with your primary provider for reevaluation. Take new prescriptions and home medications as prescribed. If you have any worsening symptoms or further concerns for health please return to an emergency department for further evaluation.

## 2020-02-23 NOTE — ED Provider Notes (Signed)
[facility] EMERGENCY DEPARTMENT Provider Note   CSN: [id number] Arrival date & time: 02/22/20  2014     History Chief Complaint  Patient presents with  . Covid Positive    Tyrone Roman is a 75 y.o. male.  HPI   75 year old male with past medical history of HTN, HLD presents the emergency department with decreased appetite, body aches, cough and chest pain/shortness of breath.  Patient is known COVID-positive.  Symptoms started 7 days ago.  He has been taking Mucinex and over-the-counter medication.  Patient states that he has been having diarrhea as well.  Patient states that the left-sided chest discomfort when he coughs.  Currently at rest he does not have any chest pain or difficulty breathing.  No swelling of his lower extremities.  Past Medical History:  Diagnosis Date  . Allergy   . Barrett's esophageal ulceration   . BPH (benign prostatic hyperplasia)   . CAD (coronary artery disease)    sees Dr. Mar Daring  . Cataract    both eyes removed   . Colonic polyp   . Concussion with loss of consciousness of 30 minutes or less   . Contact dermatitis   . Contact with or exposure to venereal diseases   . COPD (chronic obstructive pulmonary disease) ([place])    sees Dr. Kara Mead   . Diverticulosis of colon   . Dysphagia   . GERD (gastroesophageal reflux disease)    past hx- on meds   . HNP (herniated nucleus pulposus), lumbar    recurrent  . Hyperlipidemia   . Hypertension   . Laceration of scalp   . Myocardial infarction [facility])    2011- stent placed  . Nephrolithiasis   . Pre-operative cardiovascular examination   . Rheumatoid arthritis [facility])    sees Dr. Gavin Pound   . SOB (shortness of breath)     Patient Active Problem List   Diagnosis Date Noted  . Closed nondisplaced fracture of shaft of third metacarpal bone of left hand 03/04/2019  . Wound infection after surgery 01/01/2018  . Medication monitoring encounter 01/01/2018  . AKI (acute  kidney injury) ([place]) 12/10/2017  . HNP (herniated nucleus pulposus), lumbar 10/07/2017  . Lumbar herniated disc 10/07/2017  . Psoriasis of scalp 03/27/2016  . Rheumatoid arthritis ([place]) 03/25/2013  . GI bleeding 02/20/2012  . Dyspnea on exertion 09/08/2010  . COPD (chronic obstructive pulmonary disease) ([place]) 08/04/2010  . Bruising 08/04/2010  . TINNITUS 12/16/2009  . Dizziness and giddiness 12/16/2009  . NECK SPRAIN AND STRAIN 10/21/2009  . LUMBAR SPRAIN AND STRAIN 10/21/2009  . Hyperlipidemia 06/10/2009  . Coronary artery disease of native artery of native heart with stable angina pectoris ([place]) 06/10/2009  . GERD 06/10/2009  . BARRETTS ESOPHAGUS 06/10/2009  . CONCUSSION WITH LOC OF 30 MINUTES OR LESS 02/28/2009  . NEPHROLITHIASIS 07/01/2007  . CONTACT DERMATITIS 12/12/2006  . BPH with urinary obstruction 11/04/2006  . COLONIC POLYPS 10/21/2003  . DIVERTICULOSIS, COLON 10/21/2003    Past Surgical History:  Procedure Laterality Date  . BACK SURGERY    . CARDIAC CATHETERIZATION N/A 01/19/2016   Procedure: Left Heart Cath and Coronary Angiography;  Surgeon: Burnell Blanks, MD;  Location: [facility];  Service: Cardiovascular;  Laterality: N/A;  . COLONOSCOPY  12/09/2014   per Dr. Silverio Decamp, adenomatous polyps, repeat 3 years   . CORONARY ANGIOPLASTY WITH STENT PLACEMENT    . ESOPHAGOGASTRODUODENOSCOPY (EGD) WITH ESOPHAGEAL DILATION  02/17/09   Barretts esophagus   .  EYE SURGERY    . KNEE ARTHROSCOPY Left X 2  . LUMBAR LAMINECTOMY/DECOMPRESSION MICRODISCECTOMY Right 10/07/2017   Procedure: RIGHT LUMBAR ONE-TWO LAMINECTOMY WITH MICRODISCECTOMY;  Surgeon: Consuella Lose, MD;  Location: [facility];  Service: Neurosurgery;  Laterality: Right;  . LUMBAR LAMINECTOMY/DECOMPRESSION MICRODISCECTOMY N/A 12/06/2017   Procedure: RECURRENT MICRODISCECTOMY LUMBAR ONE - LUMBAR TWO;  Surgeon: Consuella Lose, MD;  Location: [facility];  Service: Neurosurgery;  Laterality: N/A;  . LUMBAR  WOUND DEBRIDEMENT N/A 12/11/2017   Procedure: LUMBAR WOUND DEBRIDEMENT;  Surgeon: Consuella Lose, MD;  Location: [facility];  Service: Neurosurgery;  Laterality: N/A;  . POLYPECTOMY    . TONSILLECTOMY    . UPPER GASTROINTESTINAL ENDOSCOPY         Family History  Problem Relation Age of Onset  . Heart attack Father        cardiovascular disorder  . Arthritis Father        family hx  . Colon cancer Father        mets  . Prostate cancer Father        1st degree relative  . Arthritis Mother   . Colon polyps Mother   . Melanoma Mother   . Dementia Mother   . Diabetes Paternal Uncle   . Colon cancer Paternal Uncle   . Stomach cancer Paternal Uncle   . Melanoma Paternal Uncle   . Cancer Maternal Grandmother   . Skin cancer Daughter   . Colon polyps Sister   . Esophageal cancer Neg Hx   . Rectal cancer Neg Hx     Social History   Tobacco Use  . Smoking status: Former Smoker    Packs/day: 1.00    Years: 20.00    Pack years: 20.00    Types: Cigarettes    Quit date: 10/12/1988    Years since quitting: 31.3  . Smokeless tobacco: Never Used  . Tobacco comment: 1960's   Vaping Use  . Vaping Use: Never used  Substance Use Topics  . Alcohol use: Yes    Alcohol/week: 0.0 standard drinks    Comment: rare  . Drug use: No    Home Medications Prior to Admission medications   Medication Sig Start Date End Date Taking? Authorizing Provider  acetaminophen (TYLENOL) 325 MG tablet Take 2 tablets (650 mg total) by mouth every 6 (six) hours as needed for mild pain, fever or headache. 12/17/17   Cherene Altes, MD  aspirin EC 81 MG tablet Take 1 tablet (81 mg total) by mouth daily. 12/11/17   Costella, Vista Mink, PA-C  Biotin 1 MG CAPS biotin    [provider]  cetirizine (ZYRTEC) 5 MG chewable tablet as needed.  02/09/19   [provider]  ciprofloxacin (CIPRO) 500 MG tablet Take 1 tablet (500 mg total) by mouth 2 (two) times daily. Patient not taking: Reported on  09/23/2019 05/11/19   [name], MD  ciprofloxacin (CIPRO) 500 MG tablet Take 1 tablet (500 mg total) by mouth 2 (two) times daily. Patient not taking: Reported on 09/23/2019 08/24/19   Eulas Post, MD  ciprofloxacin (CIPRO) 500 MG tablet Take 1 tablet (500 mg total) by mouth 2 (two) times daily. Patient not taking: Reported on 09/23/2019 08/25/19   [name], MD  ibuprofen (ADVIL) 200 MG tablet Take 800 mg by mouth every 6 (six) hours as needed. Patient not taking: Reported on 09/23/2019    [provider]  leflunomide (ARAVA) 20 MG tablet Take 20 mg by  mouth daily. Patient not taking: Reported on 09/23/2019 02/17/19   [provider]  MAGNESIUM PO Take by mouth.    [provider]  metoprolol succinate (TOPROL-XL) 25 MG 24 hr tablet Take 1 tablet (25 mg total) by mouth daily. 05/11/19   [name], MD  nitroGLYCERIN (NITROSTAT) 0.4 MG SL tablet Place 1 tablet (0.4 mg total) under the tongue every 5 (five) minutes as needed for chest pain (3 doses max). Patient not taking: Reported on 09/23/2019 12/20/17   [name], MD  omeprazole (PRILOSEC) 40 MG capsule Take 1 capsule (40 mg total) by mouth daily. 05/11/19   [name], MD  potassium chloride SA (KLOR-CON M20) 20 MEQ tablet Take 1 tablet (20 mEq total) by mouth daily. 05/11/19   [name], MD  predniSONE (DELTASONE) 10 MG tablet Take 1 tablet (10 mg total) by mouth daily with breakfast. Patient not taking: Reported on 09/23/2019 04/14/18   [name], MD  predniSONE (DELTASONE) 5 MG tablet Take 5 mg by mouth daily. 09/07/19   [provider]  riTUXimab (RITUXAN) 500 MG/50ML injection Inject into the vein. Every 4 months    [provider]  sildenafil (REVATIO) 20 MG tablet TAKE ONE TABLET BY MOUTH DAILY AS NEEDED 12/18/19   [name], MD  simvastatin (ZOCOR) 40 MG tablet TAKE ONE TABLET BY MOUTH DAILY 09/07/19   [name], MD  tamsulosin (FLOMAX) 0.4 MG CAPS capsule Take 2  capsules (0.8 mg total) by mouth daily. 12/07/19   [name], MD  traMADol (ULTRAM) 50 MG tablet Take 1-2 tablets (50-100 mg total) by mouth 3 (three) times daily as needed. Patient not taking: Reported on 09/23/2019 12/24/17   Leandrew Koyanagi, MD  zolpidem (AMBIEN) 10 MG tablet TAKE ONE TABLET BY MOUTH EVERY NIGHT AT BEDTIME 12/18/19   [name], MD    Allergies    Cephalexin, Clarithromycin, Certolizumab pegol, Tocilizumab, Doxycycline, Hydroxyzine, Lidoderm, and Pyrithione zinc  Review of Systems   Review of Systems  Constitutional: Positive for appetite change and fatigue. Negative for chills and fever.  HENT: Positive for congestion.   Eyes: Negative for visual disturbance.  Respiratory: Positive for cough. Negative for chest tightness and shortness of breath.   Cardiovascular: Negative for chest pain, palpitations and leg swelling.  Gastrointestinal: Positive for diarrhea. Negative for abdominal pain and vomiting.  Genitourinary: Negative for dysuria.  Musculoskeletal: Positive for myalgias.  Skin: Negative for rash.  Neurological: Negative for headaches.    Physical Exam Updated Vital Signs BP 110/80   Pulse (!) 102   Temp 98.6 F (37 C) (Oral)   Resp 20   Ht 6\' 2"  (1.88 m)   Wt 98.9 kg   SpO2 96%   BMI 27.99 kg/m   Physical Exam Vitals and nursing note reviewed.  Constitutional:      General: He is not in acute distress.    Appearance: Normal appearance. He is not ill-appearing, toxic-appearing or diaphoretic.  HENT:     Head: Normocephalic.     Mouth/Throat:     Mouth: Mucous membranes are moist.  Cardiovascular:     Rate and Rhythm: Normal rate.  Pulmonary:     Effort: Pulmonary effort is normal. No respiratory distress.  Abdominal:     Palpations: Abdomen is soft.     Tenderness: There is no abdominal tenderness.  Musculoskeletal:     Right lower leg: No edema.     Left lower  leg: No edema.  Skin:    General: Skin is warm.  Neurological:      Mental Status: He is alert and oriented to person, place, and time. Mental status is at baseline.  Psychiatric:        Mood and Affect: Mood normal.     ED Results / Procedures / Treatments   Labs (all labs ordered are listed, but only abnormal results are displayed) Labs Reviewed  BASIC METABOLIC PANEL - Abnormal; Notable for the following components:      Result Value   Glucose, Bld 123 (*)    BUN 25 (*)    Creatinine, Ser 1.61 (*)    GFR, Estimated 45 (*)    All other components within normal limits  CBC - Abnormal; Notable for the following components:   Platelets 92 (*)    All other components within normal limits  TROPONIN I (HIGH SENSITIVITY)  TROPONIN I (HIGH SENSITIVITY)    EKG EKG Interpretation  Date/Time:  Monday February 22 2020 20:27:53 EST Ventricular Rate:  109 PR Interval:  138 QRS Duration: 90 QT Interval:  340 QTC Calculation: 457 R Axis:   -62 Text Interpretation: Sinus tachycardia Left axis deviation Abnormal ECG Sinus tachycardia, t wave inversion in lead AVL present on previous Confirmed by Lavenia Atlas [phone]) on 02/23/2020 8:05:41 AM   Radiology DG Chest Portable 1 View  Result Date: 02/22/2020 CLINICAL DATA:  Chest pain, COVID-19 positive 02/17/2019, fever, diarrhea EXAM: PORTABLE CHEST 1 VIEW COMPARISON:  Radiograph 12/10/2017 FINDINGS: Diffuse coarse interstitial opacities and airways thickening. More patchy consolidation in the right mid lung and left lung base as well. No pneumothorax or visible effusion. The aorta is calcified. The remaining cardiomediastinal contours are unremarkable. No acute osseous or soft tissue abnormality. Degenerative changes are present in the imaged spine and shoulders. IMPRESSION: Appearance compatible with a multifocal infection in the setting COVID-19. Aortic Atherosclerosis (ICD10-I70.0). Electronically Signed   By: Lovena Le M.D.   On: 02/22/2020 20:42    Procedures Procedures (including critical care  time)  Medications Ordered in ED Medications - No data to display  ED Course  I have reviewed the triage vital signs and the nursing notes.  Pertinent labs & imaging results that were available during my care of the patient were reviewed by me and considered in my medical decision making (see chart for details).    MDM Rules/Calculators/A&P                          75 year old COVID-positive male presents the emergency department with fatigue, congestion and, decreased appetite, diarrhea, chest pain/shortness of breath.  Vitals are normal.  EKG shows no ischemic changes, chest x-ray shows multifocal infection in the setting of COVID-19.  Blood work shows baseline AKI.  Troponin is negative x2 with no delta.  Patient has no active chest pain at this time.  Slight scattered wheezing in the bases bilaterally.  He displays no respiratory distress, he is conversational on room air.  We ambulated the patient, his oxygenation remained above 95% on room air, at the end he felt fatigued and slightly dizzy but was in no acute distress.  I believe the patient is experiencing infectious symptoms from COVID-19, low suspicion for ischemic cardiac disease or PE at this time given the absence of symptoms and his oxygen level is normal even with ambulation.    Patient was able to eat and drink.  He is well-appearing. We did  orthostatic vitals, he becomes tachycardic briefly with standing but orthostatic vitals are negative.  He has been able to orally hydrate.  Patient will be discharged and treated as an outpatient.  Discharge plan and strict return to ED precautions discussed, patient verbalizes understanding and agreement. Final Clinical Impression(s) / ED Diagnoses Final diagnoses:  None    Rx / DC Orders ED Discharge Orders    None       Lorelle Gibbs, DO 02/23/20 1045

## 2020-02-29 ENCOUNTER — Inpatient Hospital Stay (HOSPITAL_COMMUNITY)
Admission: EM | Admit: 2020-02-29 | Discharge: 2020-03-03 | DRG: 177 | Disposition: A | Discharge status: Home / Self Care | Payer: Medicare Other | Attending: Internal Medicine | Admitting: Internal Medicine

## 2020-02-29 ENCOUNTER — Encounter (HOSPITAL_COMMUNITY): Payer: Self-pay

## 2020-02-29 ENCOUNTER — Emergency Department (HOSPITAL_COMMUNITY): Payer: Medicare Other

## 2020-02-29 ENCOUNTER — Encounter (HOSPITAL_COMMUNITY): Payer: Medicare Other

## 2020-02-29 ENCOUNTER — Other Ambulatory Visit: Payer: Self-pay

## 2020-02-29 DIAGNOSIS — B957 Other staphylococcus as the cause of diseases classified elsewhere: Secondary | ICD-10-CM | POA: Diagnosis present

## 2020-02-29 DIAGNOSIS — I129 Hypertensive chronic kidney disease with stage 1 through stage 4 chronic kidney disease, or unspecified chronic kidney disease: Secondary | ICD-10-CM | POA: Diagnosis present

## 2020-02-29 DIAGNOSIS — Z87891 Personal history of nicotine dependence: Secondary | ICD-10-CM

## 2020-02-29 DIAGNOSIS — R7401 Elevation of levels of liver transaminase levels: Secondary | ICD-10-CM | POA: Diagnosis present

## 2020-02-29 DIAGNOSIS — R7989 Other specified abnormal findings of blood chemistry: Secondary | ICD-10-CM | POA: Diagnosis not present

## 2020-02-29 DIAGNOSIS — K219 Gastro-esophageal reflux disease without esophagitis: Secondary | ICD-10-CM | POA: Diagnosis present

## 2020-02-29 DIAGNOSIS — D72819 Decreased white blood cell count, unspecified: Secondary | ICD-10-CM | POA: Diagnosis present

## 2020-02-29 DIAGNOSIS — R7881 Bacteremia: Secondary | ICD-10-CM | POA: Diagnosis present

## 2020-02-29 DIAGNOSIS — J1282 Pneumonia due to coronavirus disease 2019: Secondary | ICD-10-CM | POA: Diagnosis present

## 2020-02-29 DIAGNOSIS — E785 Hyperlipidemia, unspecified: Secondary | ICD-10-CM | POA: Diagnosis present

## 2020-02-29 DIAGNOSIS — Z7982 Long term (current) use of aspirin: Secondary | ICD-10-CM

## 2020-02-29 DIAGNOSIS — N1831 Chronic kidney disease, stage 3a: Secondary | ICD-10-CM | POA: Diagnosis present

## 2020-02-29 DIAGNOSIS — D6959 Other secondary thrombocytopenia: Secondary | ICD-10-CM | POA: Diagnosis present

## 2020-02-29 DIAGNOSIS — M069 Rheumatoid arthritis, unspecified: Secondary | ICD-10-CM | POA: Diagnosis present

## 2020-02-29 DIAGNOSIS — R0602 Shortness of breath: Secondary | ICD-10-CM | POA: Diagnosis not present

## 2020-02-29 DIAGNOSIS — U071 COVID-19: Secondary | ICD-10-CM | POA: Diagnosis not present

## 2020-02-29 DIAGNOSIS — N179 Acute kidney failure, unspecified: Secondary | ICD-10-CM | POA: Diagnosis present

## 2020-02-29 DIAGNOSIS — D84821 Immunodeficiency due to drugs: Secondary | ICD-10-CM | POA: Diagnosis present

## 2020-02-29 DIAGNOSIS — J440 Chronic obstructive pulmonary disease with acute lower respiratory infection: Secondary | ICD-10-CM | POA: Diagnosis present

## 2020-02-29 DIAGNOSIS — Z79899 Other long term (current) drug therapy: Secondary | ICD-10-CM

## 2020-02-29 DIAGNOSIS — M549 Dorsalgia, unspecified: Secondary | ICD-10-CM | POA: Diagnosis present

## 2020-02-29 DIAGNOSIS — Z8371 Family history of colonic polyps: Secondary | ICD-10-CM

## 2020-02-29 DIAGNOSIS — J9601 Acute respiratory failure with hypoxia: Secondary | ICD-10-CM | POA: Diagnosis present

## 2020-02-29 DIAGNOSIS — N138 Other obstructive and reflux uropathy: Secondary | ICD-10-CM | POA: Diagnosis present

## 2020-02-29 DIAGNOSIS — M0579 Rheumatoid arthritis with rheumatoid factor of multiple sites without organ or systems involvement: Secondary | ICD-10-CM | POA: Diagnosis not present

## 2020-02-29 DIAGNOSIS — J9600 Acute respiratory failure, unspecified whether with hypoxia or hypercapnia: Secondary | ICD-10-CM

## 2020-02-29 DIAGNOSIS — G8929 Other chronic pain: Secondary | ICD-10-CM | POA: Diagnosis present

## 2020-02-29 DIAGNOSIS — I2510 Atherosclerotic heart disease of native coronary artery without angina pectoris: Secondary | ICD-10-CM | POA: Diagnosis present

## 2020-02-29 DIAGNOSIS — I252 Old myocardial infarction: Secondary | ICD-10-CM

## 2020-02-29 DIAGNOSIS — Z8249 Family history of ischemic heart disease and other diseases of the circulatory system: Secondary | ICD-10-CM

## 2020-02-29 DIAGNOSIS — Z0389 Encounter for observation for other suspected diseases and conditions ruled out: Secondary | ICD-10-CM | POA: Diagnosis not present

## 2020-02-29 DIAGNOSIS — Z955 Presence of coronary angioplasty implant and graft: Secondary | ICD-10-CM

## 2020-02-29 DIAGNOSIS — Z888 Allergy status to other drugs, medicaments and biological substances status: Secondary | ICD-10-CM

## 2020-02-29 DIAGNOSIS — M5459 Other low back pain: Secondary | ICD-10-CM | POA: Diagnosis not present

## 2020-02-29 DIAGNOSIS — G4700 Insomnia, unspecified: Secondary | ICD-10-CM | POA: Diagnosis present

## 2020-02-29 DIAGNOSIS — J189 Pneumonia, unspecified organism: Secondary | ICD-10-CM | POA: Diagnosis not present

## 2020-02-29 DIAGNOSIS — R0902 Hypoxemia: Secondary | ICD-10-CM | POA: Diagnosis not present

## 2020-02-29 DIAGNOSIS — R531 Weakness: Secondary | ICD-10-CM | POA: Diagnosis not present

## 2020-02-29 DIAGNOSIS — N401 Enlarged prostate with lower urinary tract symptoms: Secondary | ICD-10-CM | POA: Diagnosis present

## 2020-02-29 DIAGNOSIS — Z881 Allergy status to other antibiotic agents status: Secondary | ICD-10-CM

## 2020-02-29 DIAGNOSIS — Z86010 Personal history of colonic polyps: Secondary | ICD-10-CM

## 2020-02-29 DIAGNOSIS — Z87442 Personal history of urinary calculi: Secondary | ICD-10-CM

## 2020-02-29 DIAGNOSIS — E860 Dehydration: Secondary | ICD-10-CM | POA: Diagnosis present

## 2020-02-29 DIAGNOSIS — Z9889 Other specified postprocedural states: Secondary | ICD-10-CM | POA: Diagnosis not present

## 2020-02-29 DIAGNOSIS — K2270 Barrett's esophagus without dysplasia: Secondary | ICD-10-CM | POA: Diagnosis present

## 2020-02-29 DIAGNOSIS — Z8261 Family history of arthritis: Secondary | ICD-10-CM

## 2020-02-29 DIAGNOSIS — Z7952 Long term (current) use of systemic steroids: Secondary | ICD-10-CM

## 2020-02-29 DIAGNOSIS — Z8619 Personal history of other infectious and parasitic diseases: Secondary | ICD-10-CM

## 2020-02-29 DIAGNOSIS — N400 Enlarged prostate without lower urinary tract symptoms: Secondary | ICD-10-CM | POA: Diagnosis present

## 2020-02-29 DIAGNOSIS — R000 Tachycardia, unspecified: Secondary | ICD-10-CM | POA: Diagnosis not present

## 2020-02-29 LAB — CBC WITH DIFFERENTIAL/PLATELET
Abs Immature Granulocytes: 0.04 10*3/uL (ref 0.00–0.07)
Basophils Absolute: 0 10*3/uL (ref 0.0–0.1)
Basophils Relative: 0 %
Eosinophils Absolute: 0.1 10*3/uL (ref 0.0–0.5)
Eosinophils Relative: 1 %
HCT: 48.6 % (ref 39.0–52.0)
Hemoglobin: 16.2 g/dL (ref 13.0–17.0)
Immature Granulocytes: 1 %
Lymphocytes Relative: 10 %
Lymphs Abs: 0.5 10*3/uL — ABNORMAL LOW (ref 0.7–4.0)
MCH: 29.8 pg (ref 26.0–34.0)
MCHC: 33.3 g/dL (ref 30.0–36.0)
MCV: 89.5 fL (ref 80.0–100.0)
Monocytes Absolute: 0.2 10*3/uL (ref 0.1–1.0)
Monocytes Relative: 4 %
Neutro Abs: 3.8 10*3/uL (ref 1.7–7.7)
Neutrophils Relative %: 84 %
Platelets: 71 10*3/uL — ABNORMAL LOW (ref 150–400)
RBC: 5.43 MIL/uL (ref 4.22–5.81)
RDW: 13.7 % (ref 11.5–15.5)
WBC: 4.5 10*3/uL (ref 4.0–10.5)
nRBC: 0 % (ref 0.0–0.2)

## 2020-02-29 LAB — LACTIC ACID, PLASMA
Lactic Acid, Venous: 3 mmol/L (ref 0.5–1.9)
Lactic Acid, Venous: 2.2 mmol/L (ref 0.5–1.9)

## 2020-02-29 LAB — COMPREHENSIVE METABOLIC PANEL
ALT: 30 U/L (ref 0–44)
AST: 79 U/L — ABNORMAL HIGH (ref 15–41)
Albumin: 3.3 g/dL — ABNORMAL LOW (ref 3.5–5.0)
Alkaline Phosphatase: 32 U/L — ABNORMAL LOW (ref 38–126)
Anion gap: 13 (ref 5–15)
BUN: 39 mg/dL — ABNORMAL HIGH (ref 8–23)
CO2: 22 mmol/L (ref 22–32)
Calcium: 10 mg/dL (ref 8.9–10.3)
Chloride: 104 mmol/L (ref 98–111)
Creatinine, Ser: 2.02 mg/dL — ABNORMAL HIGH (ref 0.61–1.24)
GFR, Estimated: 34 mL/min — ABNORMAL LOW (ref >60)
Glucose, Bld: 137 mg/dL — ABNORMAL HIGH (ref 70–99)
Potassium: 4.3 mmol/L (ref 3.5–5.1)
Sodium: 139 mmol/L (ref 135–145)
Total Bilirubin: 0.9 mg/dL (ref 0.3–1.2)
Total Protein: 7.1 g/dL (ref 6.5–8.1)

## 2020-02-29 LAB — TRIGLYCERIDES: Triglycerides: 201 mg/dL — ABNORMAL HIGH (ref <150)

## 2020-02-29 LAB — C-REACTIVE PROTEIN: CRP: 10.1 mg/dL — ABNORMAL HIGH (ref <1.0)

## 2020-02-29 LAB — FIBRINOGEN: Fibrinogen: >800 mg/dL — ABNORMAL HIGH (ref 210–475)

## 2020-02-29 LAB — POC SARS CORONAVIRUS 2 AG -  ED: SARS Coronavirus 2 Ag: POSITIVE — AB

## 2020-02-29 LAB — LACTATE DEHYDROGENASE: LDH: 425 U/L — ABNORMAL HIGH (ref 98–192)

## 2020-02-29 LAB — FERRITIN: Ferritin: 1981 ng/mL — ABNORMAL HIGH (ref 24–336)

## 2020-02-29 LAB — D-DIMER, QUANTITATIVE: D-Dimer, Quant: 12.5 ug/mL-FEU — ABNORMAL HIGH (ref 0.00–0.50)

## 2020-02-29 LAB — PROCALCITONIN: Procalcitonin: <0.1 ng/mL

## 2020-02-29 MED ORDER — TAMSULOSIN HCL 0.4 MG PO CAPS
0.8000 mg | ORAL_CAPSULE | Freq: Every day | ORAL | Status: DC
  Administered 2020-03-01 – 2020-03-03 (×3): 0.8 mg via ORAL
  Filled 2020-02-29 (×3): qty 2

## 2020-02-29 MED ORDER — ACETAMINOPHEN 325 MG PO TABS
650.0000 mg | ORAL_TABLET | Freq: Four times a day (QID) | ORAL | Status: DC | PRN
  Administered 2020-03-03: 650 mg via ORAL
  Filled 2020-02-29: qty 2

## 2020-02-29 MED ORDER — METHYLPREDNISOLONE SODIUM SUCC 125 MG IJ SOLR
1.0000 mg/kg/d | Freq: Two times a day (BID) | INTRAMUSCULAR | Status: DC
  Administered 2020-02-29 – 2020-03-03 (×6): 48.75 mg via INTRAVENOUS
  Filled 2020-02-29 (×6): qty 2

## 2020-02-29 MED ORDER — SODIUM CHLORIDE 0.9 % IV BOLUS
1000.0000 mL | Freq: Once | INTRAVENOUS | Status: AC
  Administered 2020-02-29: 1000 mL via INTRAVENOUS

## 2020-02-29 MED ORDER — SODIUM CHLORIDE 0.9 % IV SOLN
200.0000 mg | Freq: Once | INTRAVENOUS | Status: AC
  Administered 2020-02-29: 200 mg via INTRAVENOUS
  Filled 2020-02-29: qty 200

## 2020-02-29 MED ORDER — ZOLPIDEM TARTRATE 5 MG PO TABS: 5.0000 mg | ORAL_TABLET | Freq: Every evening | ORAL | Status: DC | PRN

## 2020-02-29 MED ORDER — METHYLPREDNISOLONE SODIUM SUCC 125 MG IJ SOLR: 100.0000 mg | Freq: Every day | INTRAMUSCULAR | Status: DC

## 2020-02-29 MED ORDER — ONDANSETRON HCL 4 MG/2ML IJ SOLN: 4.0000 mg | Freq: Four times a day (QID) | INTRAMUSCULAR | Status: DC | PRN

## 2020-02-29 MED ORDER — TECHNETIUM TO 99M ALBUMIN AGGREGATED
4.4000 | Freq: Once | INTRAVENOUS | Status: AC | PRN
  Administered 2020-02-29: 4.4 via INTRAVENOUS

## 2020-02-29 MED ORDER — ENOXAPARIN SODIUM 100 MG/ML ~~LOC~~ SOLN
100.0000 mg | Freq: Two times a day (BID) | SUBCUTANEOUS | Status: DC
  Administered 2020-03-01: 100 mg via SUBCUTANEOUS
  Filled 2020-02-29 (×2): qty 1

## 2020-02-29 MED ORDER — ENOXAPARIN SODIUM 100 MG/ML ~~LOC~~ SOLN
100.0000 mg | SUBCUTANEOUS | Status: AC
  Administered 2020-02-29: 100 mg via SUBCUTANEOUS
  Filled 2020-02-29: qty 1

## 2020-02-29 MED ORDER — ONDANSETRON HCL 4 MG PO TABS: 4.0000 mg | ORAL_TABLET | Freq: Four times a day (QID) | ORAL | Status: DC | PRN

## 2020-02-29 MED ORDER — METOPROLOL SUCCINATE ER 25 MG PO TB24
25.0000 mg | ORAL_TABLET | Freq: Every day | ORAL | Status: DC
  Administered 2020-03-01 – 2020-03-03 (×3): 25 mg via ORAL
  Filled 2020-02-29 (×3): qty 1

## 2020-02-29 MED ORDER — ENSURE ENLIVE PO LIQD
237.0000 mL | Freq: Two times a day (BID) | ORAL | Status: DC
  Administered 2020-03-01 – 2020-03-03 (×5): 237 mL via ORAL

## 2020-02-29 MED ORDER — SODIUM CHLORIDE 0.9 % IV SOLN
100.0000 mg | Freq: Every day | INTRAVENOUS | Status: DC
  Administered 2020-03-01 – 2020-03-03 (×3): 100 mg via INTRAVENOUS
  Filled 2020-02-29 (×3): qty 20

## 2020-02-29 MED ORDER — HYDROCODONE-ACETAMINOPHEN 5-325 MG PO TABS: 1.0000 | ORAL_TABLET | ORAL | Status: DC | PRN

## 2020-02-29 MED ORDER — SODIUM CHLORIDE 0.9 % IV SOLN: INTRAVENOUS | Status: AC

## 2020-02-29 NOTE — Progress Notes (Signed)
ANTICOAGULATION CONSULT NOTE - Initial Consult  Pharmacy Consult for Lovenox Indication: rule out  DVT  Allergies  Allergen Reactions  . Cephalexin Shortness Of Breath, Rash and Other (See Comments)    Tolerated Augmentin 2015 and 2017 (12-2017). Tolerated nafcillin 12/2017 PATIENT HAS HAD A PCN REACTION WITH IMMEDIATE RASH, FACIAL/TONGUE/THROAT SWELLING, SOB, OR LIGHTHEADEDNESS WITH HYPOTENSION:  #  #  YES  #  #  Has patient had a PCN reaction causing severe rash involving mucus membranes or skin necrosis: No Has patient had a PCN reaction that required hospitalization: No Has patient had a PCN reaction occurring within the last 10 years: No If all of the above answers are "NO", then may proceed  . Clarithromycin Shortness Of Breath and Rash  . Certolizumab Pegol Hives  . Tocilizumab   . Doxycycline Rash  . Hydroxyzine Rash  . Lidoderm Other (See Comments)    "made me act weird"  . Pyrithione Zinc Rash    Patient Measurements: Height: 6\' 2"  (188 cm) Weight: 98 kg (216 lb 0.8 oz) IBW/kg (Calculated) : 82.2   Vital Signs: Temp: 97.9 F (36.6 C) (01/24 1329) Temp Source: Oral (01/24 1329) BP: 123/82 (01/24 1530) Pulse Rate: 115 (01/24 1530)  Labs: Recent Labs    02/29/20 1343  HGB 16.2  HCT 48.6  PLT 71*  CREATININE 2.02*    Estimated Creatinine Clearance: 37.3 mL/min (A) (by C-G formula based on SCr of 2.02 mg/dL (H)).   Medical History: Past Medical History:  Diagnosis Date  . Allergy   . Barrett's esophageal ulceration   . BPH (benign prostatic hyperplasia)   . CAD (coronary artery disease)    sees Dr. Mar Daring  . Cataract    both eyes removed   . Colonic polyp   . Concussion with loss of consciousness of 30 minutes or less   . Contact dermatitis   . Contact with or exposure to venereal diseases   . COPD (chronic obstructive pulmonary disease) ([place])    sees Dr. Kara Mead   . Diverticulosis of colon   . Dysphagia   . GERD (gastroesophageal reflux  disease)    past hx- on meds   . HNP (herniated nucleus pulposus), lumbar    recurrent  . Hyperlipidemia   . Hypertension   . Laceration of scalp   . Myocardial infarction [facility])    2011- stent placed  . Nephrolithiasis   . Pre-operative cardiovascular examination   . Rheumatoid arthritis [facility])    sees Dr. Gavin Pound   . SOB (shortness of breath)     Medications:  Not on anticoagulation PTA  Assessment: 50 yoM with known COVID infection presents with worsening shortness of breath, O2 sats.   DDimer elevated @ 12.5 & strong suspicion for VTE.  VQ scan negative for PE, dopplers pending to rule out DVT.  Pharmacy consulted to start Lovenox therapeutic dosing until imaging done.  Baseline labs- Hg WNL, Pltc low 71, Scr elevated 2.02   Goal of Therapy:  Anti-Xa level 0.6-1 units/ml 4hrs after LMWH dose given Monitor platelets by anticoagulation protocol: Yes   Plan:  Lovenox 100mg  Sq q12h Monitor CBC, renal function, s/sx of bleeding  Netta Cedars PharmD, BCPS 02/29/2020,5:55 PM

## 2020-02-29 NOTE — ED Notes (Signed)
Report called and given to nurse.  

## 2020-02-29 NOTE — H&P (Signed)
History and Physical    Tyrone Roman [id number] DOB: Sep 06, 1945 DOA: 02/29/2020  PCP: [name], MD Patient coming from: Home via EMS  Chief Complaint: Couldn't take care of himself.  HPI: Tyrone Roman is a 75 y.o. male with medical history significant of rheumatoid arthritis, hyperlipidemia, CAD s/p PCI, GERD. Patient presented secondary to worsening ability to take care of himself. Symptoms started about 12-15 days ago. He reports symptoms of disorientation, lightheadedness, feeling unstable and dyspnea. He was seen at a clinic and was prescribed prednisone for pneumonia from his COVID-19 infection; he reports taking for about 12 days. Patient reports taking Tylenol and Nyquil which helped a little with his symptoms.  ED Course: Vitals: Temperature 97.9 F, pulse of 115, respirations of 24, BP of 119/94, 2Lpm of oxygen via [place] Labs: BUN of 39, creatinine of 2.02, AST of 79, CRP of 10.1, Lactic acid os 3 > 2.2, procalcitonin of <0.10, platelets of 71,000 Imaging: V/Q stan with low probability for acute PE. Chest x-ray significant for multifocal pneumonia Medications/Course: 1 L bolus of NS IV fluid  Review of Systems: Review of Systems  Constitutional: Positive for chills and fever.  Respiratory: Positive for cough, sputum production and shortness of breath.   Cardiovascular: Positive for palpitations.  Gastrointestinal: Positive for abdominal pain, diarrhea, nausea and vomiting. Negative for constipation.    Past Medical History:  Diagnosis Date  . Allergy   . Barrett's esophageal ulceration   . BPH (benign prostatic hyperplasia)   . CAD (coronary artery disease)    sees Dr. Mar Daring  . Cataract    both eyes removed   . Colonic polyp   . Concussion with loss of consciousness of 30 minutes or less   . Contact dermatitis   . Contact with or exposure to venereal diseases   . COPD (chronic obstructive pulmonary disease) ([place])    sees Dr. Kara Mead   .  Diverticulosis of colon   . Dysphagia   . GERD (gastroesophageal reflux disease)    past hx- on meds   . HNP (herniated nucleus pulposus), lumbar    recurrent  . Hyperlipidemia   . Hypertension   . Laceration of scalp   . Myocardial infarction [facility])    2011- stent placed  . Nephrolithiasis   . Pre-operative cardiovascular examination   . Rheumatoid arthritis [facility])    sees Dr. Gavin Pound   . SOB (shortness of breath)     Past Surgical History:  Procedure Laterality Date  . BACK SURGERY    . CARDIAC CATHETERIZATION N/A 01/19/2016   Procedure: Left Heart Cath and Coronary Angiography;  Surgeon: Burnell Blanks, MD;  Location: [place] [facility];  Service: Cardiovascular;  Laterality: N/A;  . COLONOSCOPY  12/09/2014   per Dr. Silverio Decamp, adenomatous polyps, repeat 3 years   . CORONARY ANGIOPLASTY WITH STENT PLACEMENT    . ESOPHAGOGASTRODUODENOSCOPY (EGD) WITH ESOPHAGEAL DILATION  02/17/09   Barretts esophagus   . EYE SURGERY    . KNEE ARTHROSCOPY Left X 2  . LUMBAR LAMINECTOMY/DECOMPRESSION MICRODISCECTOMY Right 10/07/2017   Procedure: RIGHT LUMBAR ONE-TWO LAMINECTOMY WITH MICRODISCECTOMY;  Surgeon: Consuella Lose, MD;  Location: [place];  Service: Neurosurgery;  Laterality: Right;  . LUMBAR LAMINECTOMY/DECOMPRESSION MICRODISCECTOMY N/A 12/06/2017   Procedure: RECURRENT MICRODISCECTOMY LUMBAR ONE - LUMBAR TWO;  Surgeon: Consuella Lose, MD;  Location: [place];  Service: Neurosurgery;  Laterality: N/A;  . LUMBAR WOUND DEBRIDEMENT N/A 12/11/2017   Procedure: LUMBAR WOUND DEBRIDEMENT;  Surgeon:  Consuella Lose, MD;  Location: [place];  Service: Neurosurgery;  Laterality: N/A;  . POLYPECTOMY    . TONSILLECTOMY    . UPPER GASTROINTESTINAL ENDOSCOPY       reports that he quit smoking about 31 years ago. His smoking use included cigarettes. He has a 20.00 pack-year smoking history. He has never used smokeless tobacco. He reports current alcohol use. He reports that he does not  use drugs.  Allergies  Allergen Reactions  . Cephalexin Shortness Of Breath, Rash and Other (See Comments)    Tolerated Augmentin 2015 and 2017 (12-2017). Tolerated nafcillin 12/2017 PATIENT HAS HAD A PCN REACTION WITH IMMEDIATE RASH, FACIAL/TONGUE/THROAT SWELLING, SOB, OR LIGHTHEADEDNESS WITH HYPOTENSION:  #  #  YES  #  #  Has patient had a PCN reaction causing severe rash involving mucus membranes or skin necrosis: No Has patient had a PCN reaction that required hospitalization: No Has patient had a PCN reaction occurring within the last 10 years: No If all of the above answers are "NO", then may proceed  . Clarithromycin Shortness Of Breath and Rash  . Certolizumab Pegol Hives  . Tocilizumab   . Doxycycline Rash  . Hydroxyzine Rash  . Lidoderm Other (See Comments)    "made me act weird"  . Pyrithione Zinc Rash    Family History  Problem Relation Age of Onset  . Heart attack Father        cardiovascular disorder  . Arthritis Father        family hx  . Colon cancer Father        mets  . Prostate cancer Father        1st degree relative  . Arthritis Mother   . Colon polyps Mother   . Melanoma Mother   . Dementia Mother   . Diabetes Paternal Uncle   . Colon cancer Paternal Uncle   . Stomach cancer Paternal Uncle   . Melanoma Paternal Uncle   . Cancer Maternal Grandmother   . Skin cancer Daughter   . Colon polyps Sister   . Esophageal cancer Neg Hx   . Rectal cancer Neg Hx    Prior to Admission medications   Medication Sig Start Date End Date Taking? Authorizing Provider  acetaminophen (TYLENOL) 325 MG tablet Take 2 tablets (650 mg total) by mouth every 6 (six) hours as needed for mild pain, fever or headache. 12/17/17   Cherene Altes, MD  aspirin EC 81 MG tablet Take 1 tablet (81 mg total) by mouth daily. 12/11/17   Costella, Vista Mink, PA-C  Biotin 1 MG CAPS biotin    [provider]  cetirizine (ZYRTEC) 5 MG chewable tablet as needed.  02/09/19    [provider]  ciprofloxacin (CIPRO) 500 MG tablet Take 1 tablet (500 mg total) by mouth 2 (two) times daily. Patient not taking: Reported on 09/23/2019 05/11/19   [name], MD  ciprofloxacin (CIPRO) 500 MG tablet Take 1 tablet (500 mg total) by mouth 2 (two) times daily. Patient not taking: Reported on 09/23/2019 08/24/19   Eulas Post, MD  ciprofloxacin (CIPRO) 500 MG tablet Take 1 tablet (500 mg total) by mouth 2 (two) times daily. Patient not taking: Reported on 09/23/2019 08/25/19   [name], MD  ibuprofen (ADVIL) 200 MG tablet Take 800 mg by mouth every 6 (six) hours as needed. Patient not taking: Reported on 09/23/2019    [provider]  leflunomide (ARAVA) 20 MG tablet Take 20 mg  by mouth daily. Patient not taking: Reported on 09/23/2019 02/17/19   [provider]  MAGNESIUM PO Take by mouth.    [provider]  metoprolol succinate (TOPROL-XL) 25 MG 24 hr tablet Take 1 tablet (25 mg total) by mouth daily. 05/11/19   [name], MD  nitroGLYCERIN (NITROSTAT) 0.4 MG SL tablet Place 1 tablet (0.4 mg total) under the tongue every 5 (five) minutes as needed for chest pain (3 doses max). Patient not taking: Reported on 09/23/2019 12/20/17   [name], MD  omeprazole (PRILOSEC) 40 MG capsule Take 1 capsule (40 mg total) by mouth daily. 05/11/19   [name], MD  potassium chloride SA (KLOR-CON M20) 20 MEQ tablet Take 1 tablet (20 mEq total) by mouth daily. 05/11/19   [name], MD  predniSONE (DELTASONE) 5 MG tablet Take 5 mg by mouth daily. 09/07/19   [provider]  riTUXimab (RITUXAN) 500 MG/50ML injection Inject into the vein. Every 4 months    [provider]  sildenafil (REVATIO) 20 MG tablet TAKE ONE TABLET BY MOUTH DAILY AS NEEDED 12/18/19   [name], MD  simvastatin (ZOCOR) 40 MG tablet TAKE ONE TABLET BY MOUTH DAILY 09/07/19   [name], MD  tamsulosin (FLOMAX) 0.4 MG CAPS capsule Take 2 capsules  (0.8 mg total) by mouth daily. 12/07/19   [name], MD  traMADol (ULTRAM) 50 MG tablet Take 1-2 tablets (50-100 mg total) by mouth 3 (three) times daily as needed. Patient not taking: Reported on 09/23/2019 12/24/17   Leandrew Koyanagi, MD  zolpidem (AMBIEN) 10 MG tablet TAKE ONE TABLET BY MOUTH EVERY NIGHT AT BEDTIME 12/18/19   [name], MD    Physical Exam:  Physical Exam Constitutional:      General: He is not in acute distress.    Appearance: He is not diaphoretic.  HENT:     Mouth/Throat:     Mouth: Mucous membranes are dry.  Eyes:     Conjunctiva/sclera: Conjunctivae normal.     Pupils: Pupils are equal, round, and reactive to light.  Cardiovascular:     Rate and Rhythm: Regular rhythm. Tachycardia present.     Heart sounds: Normal heart sounds. No murmur heard.   Pulmonary:     Effort: Pulmonary effort is normal. No respiratory distress.     Breath sounds: Normal breath sounds. No wheezing or rales.  Abdominal:     General: Bowel sounds are normal. There is no distension.     Palpations: Abdomen is soft.     Tenderness: There is no abdominal tenderness. There is no guarding or rebound.  Musculoskeletal:        General: Normal range of motion.     Cervical back: Normal range of motion.     Right upper leg: No swelling or edema.     Left upper leg: Tenderness present. No swelling or edema.     Right lower leg: No edema.     Left lower leg: No edema.  Lymphadenopathy:     Cervical: No cervical adenopathy.  Skin:    General: Skin is warm and dry.  Neurological:     Mental Status: He is alert and oriented to person, place, and time.      Labs on Admission: I have personally reviewed following labs and imaging studies  CBC: Recent Labs  Lab 02/22/20 2031 02/29/20 1343  WBC 8.0 4.5  NEUTROABS  --  3.8  HGB 15.3  16.2  HCT 45.6 48.6  MCV 89.4 89.5  PLT 92* 71*    Basic Metabolic Panel: Recent Labs  Lab 02/22/20 2031 02/29/20 1343  NA 137 139  K  4.1 4.3  CL 103 104  CO2 23 22  GLUCOSE 123* 137*  BUN 25* 39*  CREATININE 1.61* 2.02*  CALCIUM 9.2 10.0    GFR: Estimated Creatinine Clearance: 37.3 mL/min (A) (by C-G formula based on SCr of 2.02 mg/dL (H)).  Liver Function Tests: Recent Labs  Lab 02/29/20 1343  AST 79*  ALT 30  ALKPHOS 32*  BILITOT 0.9  PROT 7.1  ALBUMIN 3.3*   No results for input(s): LIPASE, AMYLASE in the last 168 hours. No results for input(s): AMMONIA in the last 168 hours.  Coagulation Profile: No results for input(s): INR, PROTIME in the last 168 hours.  Cardiac Enzymes: No results for input(s): CKTOTAL, CKMB, CKMBINDEX, TROPONINI in the last 168 hours.  BNP (last 3 results) No results for input(s): PROBNP in the last 8760 hours.  HbA1C: No results for input(s): HGBA1C in the last 72 hours.  CBG: No results for input(s): GLUCAP in the last 168 hours.  Lipid Profile: Recent Labs    02/29/20 1343  TRIG 201*    Thyroid Function Tests: No results for input(s): TSH, T4TOTAL, FREET4, T3FREE, THYROIDAB in the last 72 hours.  Anemia Panel: Recent Labs    02/29/20 1343  FERRITIN 1,981*    Urine analysis:    Component Value Date/Time   COLORURINE YELLOW 12/10/2017 1412   APPEARANCEUR HAZY (A) 12/10/2017 1412   LABSPEC 1.023 12/10/2017 1412   PHURINE 5.0 12/10/2017 1412   GLUCOSEU NEGATIVE 12/10/2017 1412   HGBUR MODERATE (A) 12/10/2017 1412   HGBUR negative 01/26/2010 0852   BILIRUBINUR negative 08/24/2019 1520   KETONESUR NEGATIVE 12/10/2017 1412   PROTEINUR Positive (A) 08/24/2019 1520   PROTEINUR 100 (A) 12/10/2017 1412   UROBILINOGEN 0.2 08/24/2019 1520   UROBILINOGEN 0.2 01/26/2010 0852   NITRITE negative 08/24/2019 1520   NITRITE NEGATIVE 12/10/2017 1412   LEUKOCYTESUR Moderate (2+) (A) 08/24/2019 1520     Radiological Exams on Admission: NM PULMONARY VENT AND PERF (V/Q Scan)  Result Date: 02/29/2020 CLINICAL DATA:  75 year old male with concern for pulmonary  embolism. EXAM: NUCLEAR MEDICINE PERFUSION LUNG SCAN TECHNIQUE: Perfusion images were obtained in multiple projections after intravenous injection of radiopharmaceutical. Ventilation scans intentionally deferred if perfusion scan and chest x-ray adequate for interpretation during COVID 19 epidemic. RADIOPHARMACEUTICALS:  4.4 mCi Tc-28m MAA IV COMPARISON:  Chest radiograph dated 02/29/2020. FINDINGS: There is near homogeneous perfusion uptake. No large or segmental perfusion defects identified. IMPRESSION: Low probability for pulmonary embolism. Electronically Signed   By: Anner Crete M.D.   On: 02/29/2020 16:46   DG Chest Port 1 View  Result Date: 02/29/2020 CLINICAL DATA:  Hypoxia, COVID-19 positivity EXAM: PORTABLE CHEST 1 VIEW COMPARISON:  02/22/2020 FINDINGS: Cardiac shadow is stable. The lungs are well aerated bilaterally. Patchy airspace opacities are seen increased from the prior exam in the right mid lung and left base consistent with the given clinical history. IMPRESSION: Slight increase in airspace opacities bilaterally consistent with the given clinical history. Electronically Signed   By: Inez Catalina M.D.   On: 02/29/2020 13:46    EKG: Independently reviewed. Sinus. LAFB. Unchanged.  Assessment/Plan Principal Problem:   Pneumonia due to COVID-19 virus Active Problems:   Hyperlipidemia   BPH with urinary obstruction   Rheumatoid arthritis (HCC)   AKI (acute kidney  injury) ([place])   Acute respiratory failure due to COVID-19 [facility])   COVID-19 pneumonia Multifocal pneumonia first diagnosed on 02/22/20. Patient was on increased prednisone treatment as an outpatient. -Solumedrol 1 mg/kg/day divided BID; increase if worsening disease -Remdesivir IV -Daily CMP, CBC, D-dimer, CRP  Acute respiratory failure with hypoxia Secondary to pneumonia from COVID-19 infection. Requiring 2 Lpm of oxygen on admission. -Continue oxygen, keep SpO2 >90% -Wean to room air as able -PT/OT  eval  AKI Likely secondary to poor oral intake. Patient given IV fluids in the ED. -Heart healthy diet -Continue low rate IV fluids overnight -BMP in AM  Elevated d-dimer Concern for acute clot. Low probability of PE on Q scan. Patient with left thigh pain. -LE venous duplex -Empirically start treatment dose Lovenox per pharmacy consult  Thrombocytopenia Likely secondary to acute infection. -Trend CBC  Rheumatoid arthritis Patient is on Rituxan, and prednisone as an outpatient -Steroids as mentioned above  BPH -Continue Flomax  Insomnia -Continue Ambien prn  Primary hypertension -Continue metoprolol  CAD Hyperlipidemia Patient is on aspirin and simvastatin as an outpatient -Hold simvastatin while on Remdesivir -Hold aspirin secondary to need for anticoagulation and thrombocytopenia   DVT prophylaxis: Lovenox (treatment dose) Code Status: Full code Family Communication: Daughter on telephone Disposition Plan: Discharge home vs SNF pending PT/OT, ability to wean oxygen if able, stability of COVID-19 infection, improvement of AKI. Consults called: None Admission status: Inpatient   Cordelia Poche, MD [facility] 02/29/2020, 5:18 PM

## 2020-02-29 NOTE — ED Provider Notes (Signed)
[place] DEPT Provider Note   CSN: [id number] Arrival date & time: 02/29/20  1307     History Chief Complaint  Patient presents with  . Dizziness  . Emesis  . Diarrhea    Tyrone Roman is a 75 y.o. male.  HPI Presents in the context of known Covid infection, now with concern for worsening weakness, cough, fatigue. Patient's illness began about 12 days ago, and he tested positive for COVID soon thereafter.  He has not received novel, but has used multiple OTC meds. He has been seen and evaluated, most recently last week.  Today, with worsening discomfort, fatigue, nausea he called for evaluation.  Per EMS report the patient was hypoxic on their arrival, saturation 78% on room air, this improved to 96% with supplemental oxygen.    Past Medical History:  Diagnosis Date  . Allergy   . Barrett's esophageal ulceration   . BPH (benign prostatic hyperplasia)   . CAD (coronary artery disease)    sees Dr. Mar Daring  . Cataract    both eyes removed   . Colonic polyp   . Concussion with loss of consciousness of 30 minutes or less   . Contact dermatitis   . Contact with or exposure to venereal diseases   . COPD (chronic obstructive pulmonary disease) ([place])    sees Dr. Kara Mead   . Diverticulosis of colon   . Dysphagia   . GERD (gastroesophageal reflux disease)    past hx- on meds   . HNP (herniated nucleus pulposus), lumbar    recurrent  . Hyperlipidemia   . Hypertension   . Laceration of scalp   . Myocardial infarction [facility])    2011- stent placed  . Nephrolithiasis   . Pre-operative cardiovascular examination   . Rheumatoid arthritis [facility])    sees Dr. Gavin Pound   . SOB (shortness of breath)     Patient Active Problem List   Diagnosis Date Noted  . Closed nondisplaced fracture of shaft of third metacarpal bone of left hand 03/04/2019  . Wound infection after surgery 01/01/2018  . Medication monitoring encounter 01/01/2018  . AKI  (acute kidney injury) ([place]) 12/10/2017  . HNP (herniated nucleus pulposus), lumbar 10/07/2017  . Lumbar herniated disc 10/07/2017  . Psoriasis of scalp 03/27/2016  . Rheumatoid arthritis ([place]) 03/25/2013  . GI bleeding 02/20/2012  . Dyspnea on exertion 09/08/2010  . COPD (chronic obstructive pulmonary disease) ([place]) 08/04/2010  . Bruising 08/04/2010  . TINNITUS 12/16/2009  . Dizziness and giddiness 12/16/2009  . NECK SPRAIN AND STRAIN 10/21/2009  . LUMBAR SPRAIN AND STRAIN 10/21/2009  . Hyperlipidemia 06/10/2009  . Coronary artery disease of native artery of native heart with stable angina pectoris ([place]) 06/10/2009  . GERD 06/10/2009  . BARRETTS ESOPHAGUS 06/10/2009  . CONCUSSION WITH LOC OF 30 MINUTES OR LESS 02/28/2009  . NEPHROLITHIASIS 07/01/2007  . CONTACT DERMATITIS 12/12/2006  . BPH with urinary obstruction 11/04/2006  . COLONIC POLYPS 10/21/2003  . DIVERTICULOSIS, COLON 10/21/2003    Past Surgical History:  Procedure Laterality Date  . BACK SURGERY    . CARDIAC CATHETERIZATION N/A 01/19/2016   Procedure: Left Heart Cath and Coronary Angiography;  Surgeon: Burnell Blanks, MD;  Location: [place] CV LAB;  Service: Cardiovascular;  Laterality: N/A;  . COLONOSCOPY  12/09/2014   per Dr. Silverio Decamp, adenomatous polyps, repeat 3 years   . CORONARY ANGIOPLASTY WITH STENT PLACEMENT    . ESOPHAGOGASTRODUODENOSCOPY (EGD) WITH ESOPHAGEAL DILATION  02/17/09  Barretts esophagus   . EYE SURGERY    . KNEE ARTHROSCOPY Left X 2  . LUMBAR LAMINECTOMY/DECOMPRESSION MICRODISCECTOMY Right 10/07/2017   Procedure: RIGHT LUMBAR ONE-TWO LAMINECTOMY WITH MICRODISCECTOMY;  Surgeon: Consuella Lose, MD;  Location: [place];  Service: Neurosurgery;  Laterality: Right;  . LUMBAR LAMINECTOMY/DECOMPRESSION MICRODISCECTOMY N/A 12/06/2017   Procedure: RECURRENT MICRODISCECTOMY LUMBAR ONE - LUMBAR TWO;  Surgeon: Consuella Lose, MD;  Location: [place];  Service: Neurosurgery;  Laterality: N/A;  .  LUMBAR WOUND DEBRIDEMENT N/A 12/11/2017   Procedure: LUMBAR WOUND DEBRIDEMENT;  Surgeon: Consuella Lose, MD;  Location: [place];  Service: Neurosurgery;  Laterality: N/A;  . POLYPECTOMY    . TONSILLECTOMY    . UPPER GASTROINTESTINAL ENDOSCOPY         Family History  Problem Relation Age of Onset  . Heart attack Father        cardiovascular disorder  . Arthritis Father        family hx  . Colon cancer Father        mets  . Prostate cancer Father        1st degree relative  . Arthritis Mother   . Colon polyps Mother   . Melanoma Mother   . Dementia Mother   . Diabetes Paternal Uncle   . Colon cancer Paternal Uncle   . Stomach cancer Paternal Uncle   . Melanoma Paternal Uncle   . Cancer Maternal Grandmother   . Skin cancer Daughter   . Colon polyps Sister   . Esophageal cancer Neg Hx   . Rectal cancer Neg Hx     Social History   Tobacco Use  . Smoking status: Former Smoker    Packs/day: 1.00    Years: 20.00    Pack years: 20.00    Types: Cigarettes    Quit date: 10/12/1988    Years since quitting: 31.4  . Smokeless tobacco: Never Used  . Tobacco comment: 1960's   Vaping Use  . Vaping Use: Never used  Substance Use Topics  . Alcohol use: Yes    Alcohol/week: 0.0 standard drinks    Comment: rare  . Drug use: No    Home Medications Prior to Admission medications   Medication Sig Start Date End Date Taking? Authorizing Provider  acetaminophen (TYLENOL) 325 MG tablet Take 2 tablets (650 mg total) by mouth every 6 (six) hours as needed for mild pain, fever or headache. 12/17/17   Cherene Altes, MD  aspirin EC 81 MG tablet Take 1 tablet (81 mg total) by mouth daily. 12/11/17   Costella, Vista Mink, PA-C  Biotin 1 MG CAPS biotin    [provider]  cetirizine (ZYRTEC) 5 MG chewable tablet as needed.  02/09/19   [provider]  ciprofloxacin (CIPRO) 500 MG tablet Take 1 tablet (500 mg total) by mouth 2 (two) times daily. Patient not taking:  Reported on 09/23/2019 05/11/19   [name], MD  ciprofloxacin (CIPRO) 500 MG tablet Take 1 tablet (500 mg total) by mouth 2 (two) times daily. Patient not taking: Reported on 09/23/2019 08/24/19   Eulas Post, MD  ciprofloxacin (CIPRO) 500 MG tablet Take 1 tablet (500 mg total) by mouth 2 (two) times daily. Patient not taking: Reported on 09/23/2019 08/25/19   [name], MD  ibuprofen (ADVIL) 200 MG tablet Take 800 mg by mouth every 6 (six) hours as needed. Patient not taking: Reported on 09/23/2019    [provider]  leflunomide (ARAVA) 20 MG  tablet Take 20 mg by mouth daily. Patient not taking: Reported on 09/23/2019 02/17/19   [provider]  MAGNESIUM PO Take by mouth.    [provider]  metoprolol succinate (TOPROL-XL) 25 MG 24 hr tablet Take 1 tablet (25 mg total) by mouth daily. 05/11/19   [name], MD  nitroGLYCERIN (NITROSTAT) 0.4 MG SL tablet Place 1 tablet (0.4 mg total) under the tongue every 5 (five) minutes as needed for chest pain (3 doses max). Patient not taking: Reported on 09/23/2019 12/20/17   [name], MD  omeprazole (PRILOSEC) 40 MG capsule Take 1 capsule (40 mg total) by mouth daily. 05/11/19   [name], MD  potassium chloride SA (KLOR-CON M20) 20 MEQ tablet Take 1 tablet (20 mEq total) by mouth daily. 05/11/19   [name], MD  predniSONE (DELTASONE) 5 MG tablet Take 5 mg by mouth daily. 09/07/19   [provider]  riTUXimab (RITUXAN) 500 MG/50ML injection Inject into the vein. Every 4 months    [provider]  sildenafil (REVATIO) 20 MG tablet TAKE ONE TABLET BY MOUTH DAILY AS NEEDED 12/18/19   [name], MD  simvastatin (ZOCOR) 40 MG tablet TAKE ONE TABLET BY MOUTH DAILY 09/07/19   [name], MD  tamsulosin (FLOMAX) 0.4 MG CAPS capsule Take 2 capsules (0.8 mg total) by mouth daily. 12/07/19   [name], MD  traMADol (ULTRAM) 50 MG tablet Take 1-2 tablets (50-100 mg total) by mouth 3 (three)  times daily as needed. Patient not taking: Reported on 09/23/2019 12/24/17   Leandrew Koyanagi, MD  zolpidem (AMBIEN) 10 MG tablet TAKE ONE TABLET BY MOUTH EVERY NIGHT AT BEDTIME 12/18/19   [name], MD    Allergies    Cephalexin, Clarithromycin, Certolizumab pegol, Tocilizumab, Doxycycline, Hydroxyzine, Lidoderm, and Pyrithione zinc  Review of Systems   Review of Systems  Constitutional:       Per HPI, otherwise negative  HENT:       Per HPI, otherwise negative  Respiratory:       Per HPI, otherwise negative  Cardiovascular:       Per HPI, otherwise negative  Gastrointestinal: Positive for diarrhea, nausea and vomiting. Negative for abdominal pain.  Endocrine:       Negative aside from HPI  Genitourinary:       Neg aside from HPI   Musculoskeletal:       Per HPI, otherwise negative  Skin: Negative.   Neurological: Positive for weakness. Negative for syncope.    Physical Exam Updated Vital Signs BP 135/88   Pulse (!) 116   Temp 97.9 F (36.6 C) (Oral)   Resp (!) 36   Ht 6\' 2"  (1.88 m)   Wt 98 kg   SpO2 93%   BMI 27.74 kg/m   Physical Exam Vitals and nursing note reviewed.  Constitutional:      Appearance: He is well-developed. He is ill-appearing.  HENT:     Head: Normocephalic and atraumatic.  Eyes:     Extraocular Movements: EOM normal.     Conjunctiva/sclera: Conjunctivae normal.  Cardiovascular:     Rate and Rhythm: Regular rhythm. Tachycardia present.  Pulmonary:     Effort: Tachypnea present.     Breath sounds: Decreased air movement present.  Abdominal:     General: There is no distension.  Musculoskeletal:        General: No edema.  Skin:    General: Skin is warm and  dry.  Neurological:     Mental Status: He is alert and oriented to person, place, and time.  Psychiatric:        Mood and Affect: Mood and affect normal.     ED Results / Procedures / Treatments   Labs (all labs ordered are listed, but only abnormal results are  displayed) Labs Reviewed  CBC WITH DIFFERENTIAL/PLATELET - Abnormal; Notable for the following components:      Result Value   Platelets 71 (*)    Lymphs Abs 0.5 (*)    All other components within normal limits  COMPREHENSIVE METABOLIC PANEL - Abnormal; Notable for the following components:   Glucose, Bld 137 (*)    BUN 39 (*)    Creatinine, Ser 2.02 (*)    Albumin 3.3 (*)    AST 79 (*)    Alkaline Phosphatase 32 (*)    GFR, Estimated 34 (*)    All other components within normal limits  D-DIMER, QUANTITATIVE (NOT AT [facility]) - Abnormal; Notable for the following components:   D-Dimer, Quant 12.50 (*)    All other components within normal limits  LACTATE DEHYDROGENASE - Abnormal; Notable for the following components:   LDH 425 (*)    All other components within normal limits  TRIGLYCERIDES - Abnormal; Notable for the following components:   Triglycerides 201 (*)    All other components within normal limits  FIBRINOGEN - Abnormal; Notable for the following components:   Fibrinogen >800 (*)    All other components within normal limits  CULTURE, BLOOD (ROUTINE X 2)  CULTURE, BLOOD (ROUTINE X 2)  LACTIC ACID, PLASMA  LACTIC ACID, PLASMA  PROCALCITONIN  FERRITIN  C-REACTIVE PROTEIN  POC SARS CORONAVIRUS 2 AG -  ED    EKG EKG Interpretation  Date/Time:  Monday February 29 2020 13:31:48 EST Ventricular Rate:  104 PR Interval:    QRS Duration: 129 QT Interval:  342 QTC Calculation: 450 R Axis:   -98 Text Interpretation: Sinus tachycardia Probable left atrial enlargement RBBB and LAFB No significant change since last tracing Confirmed by Linwood Dibbles [id number]) on 02/29/2020 1:38:13 PM   Radiology DG Chest Port 1 View  Result Date: 02/29/2020 CLINICAL DATA:  Hypoxia, COVID-19 positivity EXAM: PORTABLE CHEST 1 VIEW COMPARISON:  02/22/2020 FINDINGS: Cardiac shadow is stable. The lungs are well aerated bilaterally. Patchy airspace opacities are seen increased from the prior exam in the  right mid lung and left base consistent with the given clinical history. IMPRESSION: Slight increase in airspace opacities bilaterally consistent with the given clinical history. Electronically Signed   By: Alcide Clever M.D.   On: 02/29/2020 13:46    Procedures Procedures   Medications Ordered in ED Medications - No data to display  ED Course  I have reviewed the triage vital signs and the nursing notes.  Pertinent labs & imaging results that were available during my care of the patient were reviewed by me and considered in my medical decision making (see chart for details).  Clinical Course as of 02/29/20 1657  Mon Feb 29, 2020  1651 Covid symptoms 2 wks plus; 78% on RA [JH]    Clinical Course User Index [JH] Cheryll Cockayne, MD  With tachycardia, tachypnea, increased work of breathing, is a concern for progression of disease, patient had labs ordered, continue to receive supplemental oxygen.  2 L via nasal cannula results and saturation 96%, abnormal Cardiac monitor 110, sinus tach, abnormal  Patient's labs notable for substantially elevated D-dimer,  given his new oxygen requirement he will require additional imaging for consideration of PE.  This adult male with a history of Covid presents with worsening nausea and vomiting, weakness, dyspnea. Patient is awake and alert, but has evidence for SIRS, and with consideration of oxygen requirement will require admission for further monitoring, management. MDM Rules/Calculators/A&P MDM Number of Diagnoses or Management Options COVID: established, worsening Hypoxia: new, needed workup   Amount and/or Complexity of Data Reviewed Clinical lab tests: reviewed Tests in the radiology section of CPT: reviewed Tests in the medicine section of CPT: reviewed Decide to obtain previous medical records or to obtain history from someone other than the patient: yes Obtain history from someone other than the patient: yes Review and summarize  past medical records: yes Discuss the patient with other providers: yes Independent visualization of images, tracings, or specimens: yes  Risk of Complications, Morbidity, and/or Mortality Presenting problems: high Diagnostic procedures: high Management options: high  Critical Care Total time providing critical care: < 30 minutes  Patient Progress Patient progress: stable  Final Clinical Impression(s) / ED Diagnoses Final diagnoses:  COVID  Hypoxia     Carmin Muskrat, MD 02/29/20 1658

## 2020-02-29 NOTE — Progress Notes (Signed)
Flutter given to pt./explained.

## 2020-02-29 NOTE — ED Triage Notes (Signed)
covid + 1/12, diagnosed with PNA 1/17. Complains of shob/dizziness/n/v/d. Per ems 78% on RA. EMS placed on 2lpm . cbg-150

## 2020-03-01 ENCOUNTER — Inpatient Hospital Stay (HOSPITAL_COMMUNITY): Payer: Medicare Other

## 2020-03-01 DIAGNOSIS — U071 COVID-19: Secondary | ICD-10-CM | POA: Diagnosis not present

## 2020-03-01 DIAGNOSIS — R7989 Other specified abnormal findings of blood chemistry: Secondary | ICD-10-CM

## 2020-03-01 LAB — CBC WITH DIFFERENTIAL/PLATELET
Abs Immature Granulocytes: 0.01 10*3/uL (ref 0.00–0.07)
Basophils Absolute: 0 10*3/uL (ref 0.0–0.1)
Basophils Relative: 0 %
Eosinophils Absolute: 0 10*3/uL (ref 0.0–0.5)
Eosinophils Relative: 0 %
HCT: 43.4 % (ref 39.0–52.0)
Hemoglobin: 14.1 g/dL (ref 13.0–17.0)
Immature Granulocytes: 0 %
Lymphocytes Relative: 10 %
Lymphs Abs: 0.3 10*3/uL — ABNORMAL LOW (ref 0.7–4.0)
MCH: 29.4 pg (ref 26.0–34.0)
MCHC: 32.5 g/dL (ref 30.0–36.0)
MCV: 90.6 fL (ref 80.0–100.0)
Monocytes Absolute: 0.1 10*3/uL (ref 0.1–1.0)
Monocytes Relative: 5 %
Neutro Abs: 2.3 10*3/uL (ref 1.7–7.7)
Neutrophils Relative %: 85 %
Platelets: 62 10*3/uL — ABNORMAL LOW (ref 150–400)
RBC: 4.79 MIL/uL (ref 4.22–5.81)
RDW: 13.8 % (ref 11.5–15.5)
WBC: 2.8 10*3/uL — ABNORMAL LOW (ref 4.0–10.5)
nRBC: 0 % (ref 0.0–0.2)

## 2020-03-01 LAB — BLOOD CULTURE ID PANEL (REFLEXED) - BCID2

## 2020-03-01 LAB — COMPREHENSIVE METABOLIC PANEL
ALT: 29 U/L (ref 0–44)
AST: 93 U/L — ABNORMAL HIGH (ref 15–41)
Albumin: 3 g/dL — ABNORMAL LOW (ref 3.5–5.0)
Alkaline Phosphatase: 30 U/L — ABNORMAL LOW (ref 38–126)
Anion gap: 10 (ref 5–15)
BUN: 42 mg/dL — ABNORMAL HIGH (ref 8–23)
CO2: 19 mmol/L — ABNORMAL LOW (ref 22–32)
Calcium: 8.9 mg/dL (ref 8.9–10.3)
Chloride: 109 mmol/L (ref 98–111)
Creatinine, Ser: 1.68 mg/dL — ABNORMAL HIGH (ref 0.61–1.24)
GFR, Estimated: 42 mL/min — ABNORMAL LOW (ref >60)
Glucose, Bld: 177 mg/dL — ABNORMAL HIGH (ref 70–99)
Potassium: 4.6 mmol/L (ref 3.5–5.1)
Sodium: 138 mmol/L (ref 135–145)
Total Bilirubin: 0.9 mg/dL (ref 0.3–1.2)
Total Protein: 6.1 g/dL — ABNORMAL LOW (ref 6.5–8.1)

## 2020-03-01 LAB — C-REACTIVE PROTEIN: CRP: 11.9 mg/dL — ABNORMAL HIGH (ref <1.0)

## 2020-03-01 LAB — D-DIMER, QUANTITATIVE: D-Dimer, Quant: 7.2 ug/mL-FEU — ABNORMAL HIGH (ref 0.00–0.50)

## 2020-03-01 MED ORDER — SODIUM CHLORIDE 0.9 % IV SOLN: INTRAVENOUS | Status: AC

## 2020-03-01 MED ORDER — ENOXAPARIN SODIUM 40 MG/0.4ML ~~LOC~~ SOLN
40.0000 mg | SUBCUTANEOUS | Status: DC
  Administered 2020-03-02 – 2020-03-03 (×2): 40 mg via SUBCUTANEOUS
  Filled 2020-03-01 (×2): qty 0.4

## 2020-03-01 NOTE — Progress Notes (Signed)
PHARMACY - PHYSICIAN COMMUNICATION CRITICAL VALUE ALERT - BLOOD CULTURE IDENTIFICATION (BCID)  Tyrone Roman is an 75 y.o. male with hx RA on rituxan PTA and COVID-19 who presented to [facility] on 02/29/2020 with a chief complaint of generalized weakness and SOB. 2 of 4 (aerobic bottles) blood cx bottles have GPC in clusters (BCID= CoNS)  Name of physician (or Provider) Contacted: Dr. Loleta Books  Current antibiotics: none  Changes to prescribed antibiotics recommended:  - informed Dr. Loleta Books. He will evaluate patient and make a decision re: whether to start abx or not.  Results for orders placed or performed during the hospital encounter of 02/29/20  Blood Culture ID Panel (Reflexed) (Collected: 02/29/2020  4:00 PM)  Result Value Ref Range   Enterococcus faecalis NOT DETECTED NOT DETECTED   Enterococcus Faecium NOT DETECTED NOT DETECTED   Listeria monocytogenes NOT DETECTED NOT DETECTED   Staphylococcus species DETECTED (A) NOT DETECTED   Staphylococcus aureus (BCID) NOT DETECTED NOT DETECTED   Staphylococcus epidermidis NOT DETECTED NOT DETECTED   Staphylococcus lugdunensis NOT DETECTED NOT DETECTED   Streptococcus species NOT DETECTED NOT DETECTED   Streptococcus agalactiae NOT DETECTED NOT DETECTED   Streptococcus pneumoniae NOT DETECTED NOT DETECTED   Streptococcus pyogenes NOT DETECTED NOT DETECTED   A.calcoaceticus-baumannii NOT DETECTED NOT DETECTED   Bacteroides fragilis NOT DETECTED NOT DETECTED   Enterobacterales NOT DETECTED NOT DETECTED   Enterobacter cloacae complex NOT DETECTED NOT DETECTED   Escherichia coli NOT DETECTED NOT DETECTED   Klebsiella aerogenes NOT DETECTED NOT DETECTED   Klebsiella oxytoca NOT DETECTED NOT DETECTED   Klebsiella pneumoniae NOT DETECTED NOT DETECTED   Proteus species NOT DETECTED NOT DETECTED   Salmonella species NOT DETECTED NOT DETECTED   Serratia marcescens NOT DETECTED NOT DETECTED   Haemophilus influenzae NOT DETECTED NOT DETECTED    Neisseria meningitidis NOT DETECTED NOT DETECTED   Pseudomonas aeruginosa NOT DETECTED NOT DETECTED   Stenotrophomonas maltophilia NOT DETECTED NOT DETECTED   Candida albicans NOT DETECTED NOT DETECTED   Candida auris NOT DETECTED NOT DETECTED   Candida glabrata NOT DETECTED NOT DETECTED   Candida krusei NOT DETECTED NOT DETECTED   Candida parapsilosis NOT DETECTED NOT DETECTED   Candida tropicalis NOT DETECTED NOT DETECTED   Cryptococcus neoformans/gattii NOT DETECTED NOT DETECTED    Lynelle Doctor 03/01/2020  3:32 PM

## 2020-03-01 NOTE — Progress Notes (Signed)
Occupational Therapy Evaluation  Patient lives home alone, has [facility] but can stay main level and is I at baseline. Patient currently with instability with functional ambulation + transfers requiring min G to min A for safety due to x1 loss of balance and no AD use. Patient min G at sink side for g/h for safety, VSS on room air throughout session. Patient will benefit from skilled OT services to improve activity tolerance, balance and cardiopulmonary status in order for patient to return home at discharge.    03/01/20 1213  OT Visit Information  Last OT Received On 03/01/20  Assistance Needed +1  PT/OT/SLP Co-Evaluation/Treatment Yes  Reason for Co-Treatment To address functional/ADL transfers  OT goals addressed during session ADL's and self-care  History of Present Illness Tyrone Roman is a 75 y.o. male with medical history significant of rheumatoid arthritis, hyperlipidemia, CAD s/p PCI, GERD. Patient presented secondary to worsening ability to take care of himself. Symptoms started about 12-15 days ago. He reports symptoms of disorientation, lightheadedness, feeling unstable and dyspnea. He was seen at a clinic and was prescribed prednisone for pneumonia from his COVID-19 infection; he reports taking for about 12 days.  Chest x ray-Slight increase in airspace opacities bilaterally  Precautions  Precautions Fall  Precaution Comments monitor sats, did well on RA, slight balance loss  Restrictions  Weight Bearing Restrictions No  Home Living  Family/patient expects to be discharged to: Private residence  Living Arrangements Alone  Available Help at Discharge Family  Type of [place] to enter  Entrance Stairs-Number of Steps 3  Entrance Stairs-[place] Two level;Able to live on main level with bedroom/bathroom  Dance movement psychotherapist - 2 wheels;Cane - single point;Toilet riser;Shower seat  Prior Function  Level  of Independence Independent  Communication  Communication No difficulties  Pain Assessment  Pain Assessment No/denies pain  Cognition  Arousal/Alertness Awake/alert  Behavior During Therapy WFL for tasks assessed/performed  Overall Cognitive Status Within Functional Limits for tasks assessed  Upper Extremity Assessment  Upper Extremity Assessment Overall WFL for tasks assessed  Lower Extremity Assessment  Lower Extremity Assessment Defer to PT evaluation  Cervical / Trunk Assessment  Cervical / Trunk Assessment Normal  ADL  Overall ADL's  Needs assistance/impaired  Grooming Oral care;Min guard;Standing  Grooming Details (indicate cue type and reason) leans onto sink for support, min G for safety due to instability with mild LOB ambulating to bathroom  Upper Body Bathing Set up;Sitting  Lower Body Bathing Minimal assistance;Sit to/from stand  Upper Body Dressing  Set up;Sitting  Lower Body Dressing Minimal assistance;Sit to/from stand  Lower Body Dressing Details (indicate cue type and reason) for standing balance  Toilet Transfer Minimal assistance;Ambulation  Toilet Transfer Details (indicate cue type and reason) patient unsteady with ambulation, holding onto furniture to stabilize with x1 loss of balance ambulating to bathroom min A for safety  Toileting- Clothing Manipulation and Hygiene Min guard;Sit to/from stand;Sitting/lateral lean  Functional mobility during ADLs Minimal assistance  General ADL Comments patient requiring increased assistance for self care due to decreased activity tolerance and balance. VSS on room air throughout session  Bed Mobility  Overal bed mobility Independent  Transfers  Overall transfer level Needs assistance  Equipment used None  Transfers Sit to/from Stand  Sit to Stand Min guard  Balance  Overall balance assessment Needs assistance  Sitting-balance support No upper extremity supported  Sitting balance-Leahy Scale Good  Standing  balance  support Single extremity supported  Standing balance-Leahy Scale Poor  Standing balance comment leans onto sink for support in standing, balance did improve with increased ambulation  OT - End of Session  Activity Tolerance Patient tolerated treatment well  Patient left in bed;with call bell/phone within reach  Nurse Communication Mobility status  OT Assessment  OT Recommendation/Assessment Patient needs continued OT Services  OT Visit Diagnosis Unsteadiness on feet (R26.81)  OT Problem List Decreased activity tolerance;Impaired balance (sitting and/or standing);Cardiopulmonary status limiting activity;Decreased safety awareness  OT Plan  OT Frequency (ACUTE ONLY) Min 2X/week  OT Treatment/Interventions (ACUTE ONLY) Self-care/ADL training;Therapeutic activities;Patient/family education;Balance training;DME and/or AE instruction  AM-PAC OT "6 Clicks" Daily Activity Outcome Measure (Version 2)  Help from another person eating meals? 4  Help from another person taking care of personal grooming? 3  Help from another person toileting, which includes using toliet, bedpan, or urinal? 3  Help from another person bathing (including washing, rinsing, drying)? 3  Help from another person to put on and taking off regular upper body clothing? 3  Help from another person to put on and taking off regular lower body clothing? 3  6 Click Score 19  OT Recommendation  Follow Up Recommendations No OT follow up  OT Equipment None recommended by OT  Individuals Consulted  Consulted and Agree with Results and Recommendations Patient  Acute Rehab OT Goals  Patient Stated Goal to go home  OT Goal Formulation With patient  Time For Goal Achievement 03/15/20  Potential to Achieve Goals Good  OT Time Calculation  OT Start Time (ACUTE ONLY) 0836  OT Stop Time (ACUTE ONLY) 0856  OT Time Calculation (min) 20 min  OT General Charges  $OT Visit 1 Visit  OT Evaluation  $OT Eval Low Complexity 1 Low  Written  Expression  Dominant Hand Right   Delbert Phenix OT OT pager: [phone]

## 2020-03-01 NOTE — CV Procedure (Signed)
BLE venous duplex completed.  Results can be found under chart review under CV PROC. 03/01/2020 12:01 PM Lyllian Gause RVT, RDMS

## 2020-03-01 NOTE — Evaluation (Signed)
Physical Therapy Evaluation Patient Details Name: Tyrone Roman MRN: [id number] DOB: [birth year] Today's Date: 03/01/2020   History of Present Illness  Tyrone Roman is a 75 y.o. male with medical history significant of rheumatoid arthritis, hyperlipidemia, CAD s/p PCI, GERD. Patient presented secondary to worsening ability to take care of himself. Symptoms started about 12-15 days ago. He reports symptoms of disorientation, lightheadedness, feeling unstable and dyspnea. He was seen at a clinic and was prescribed prednisone for pneumonia from his COVID-19 infection; he reports taking for about 12 days.  Chest x ray-Slight increase in airspace opacities bilaterally  Clinical Impression  The patient is mildly unsteady when ambulating in room. Patient's SPO2 > 94% on Ra while mobilizing. Patient should improve to return home.  Pt admitted with above diagnosis.   Pt currently with functional limitations due to the deficits listed below (see PT Problem List). Pt will benefit from skilled PT to increase their independence and safety with mobility to allow discharge to the venue listed below.       Follow Up Recommendations Home health PT;Supervision - Intermittent    Equipment Recommendations  None recommended by PT    Recommendations for Other Services       Precautions / Restrictions Precautions Precaution Comments: monitor sats, did well on RA, slight balance loss      Mobility  Bed Mobility Overal bed mobility: Independent                  Transfers Overall transfer level: Needs assistance Equipment used: None Transfers: Sit to/from Stand Sit to Stand: Min guard         General transfer comment: close guard  Ambulation/Gait Ambulation/Gait assistance: Min guard Gait Distance (Feet): 30 Feet Assistive device: None Gait Pattern/deviations: Step-through pattern Gait velocity: decr   General Gait Details: gait unsteady, reaches for wall, bed to steady.  Stairs             Wheelchair Mobility    Modified Rankin (Stroke Patients Only)       Balance Overall balance assessment: Needs assistance Sitting-balance support: No upper extremity supported       Standing balance support: During functional activity;No upper extremity supported Standing balance-Leahy Scale: Poor Standing balance comment: improved with increased ambulation                             Pertinent Vitals/Pain Pain Assessment: No/denies pain    Home Living Family/patient expects to be discharged to:: Private residence Living Arrangements: Alone   Type of Home: House Home Access: Stairs to enter Entrance Stairs-Rails: Psychiatric nurse of Steps: 3 Home Layout: Two level;Able to live on main level with bedroom/bathroom Home Equipment: Gilford Rile - 2 wheels;Cane - single point;Toilet riser;Shower seat      Prior Function Level of Independence: Independent               Hand Dominance   Dominant Hand: Right    Extremity/Trunk Assessment        Lower Extremity Assessment Lower Extremity Assessment: Generalized weakness    Cervical / Trunk Assessment Cervical / Trunk Assessment: Normal  Communication   Communication: No difficulties  Cognition Arousal/Alertness: Awake/alert Behavior During Therapy: WFL for tasks assessed/performed Overall Cognitive Status: Within Functional Limits for tasks assessed  General Comments      Exercises     Assessment/Plan    PT Assessment Patient needs continued PT services  PT Problem List Decreased strength;Decreased balance;Decreased knowledge of precautions;Decreased activity tolerance;Decreased safety awareness;Decreased mobility       PT Treatment Interventions      PT Goals (Current goals can be found in the Care Plan section)  Acute Rehab PT Goals Patient Stated Goal: to go home PT Goal Formulation: With  patient Time For Goal Achievement: 03/15/20 Potential to Achieve Goals: Good    Frequency Min 3X/week   Barriers to discharge Decreased caregiver support      Co-evaluation PT/OT/SLP Co-Evaluation/Treatment: Yes Reason for Co-Treatment: For patient/therapist safety           AM-PAC PT "6 Clicks" Mobility  Outcome Measure Help needed turning from your back to your side while in a flat bed without using bedrails?: None Help needed moving from lying on your back to sitting on the side of a flat bed without using bedrails?: None Help needed moving to and from a bed to a chair (including a wheelchair)?: A Little Help needed standing up from a chair using your arms (e.g., wheelchair or bedside chair)?: A Little Help needed to walk in hospital room?: A Little Help needed climbing 3-5 steps with a railing? : A Little 6 Click Score: 20    End of Session   Activity Tolerance: Patient tolerated treatment well Patient left: in bed;with bed alarm set Nurse Communication: Mobility status PT Visit Diagnosis: Unsteadiness on feet (R26.81)    Time: 1914-7829 PT Time Calculation (min) (ACUTE ONLY): 17 min   Charges:   PT Evaluation $PT Eval Low Complexity: 1 Low          [place] Pager [phone] Office [phone]   Claretha Cooper 03/01/2020, 11:57 AM

## 2020-03-01 NOTE — Plan of Care (Signed)
  Problem: Coping: Goal: Level of anxiety will decrease Outcome: Progressing   Problem: Elimination: Goal: Will not experience complications related to urinary retention Outcome: Progressing   Problem: Safety: Goal: Ability to remain free from injury will improve Outcome: Progressing   

## 2020-03-01 NOTE — Progress Notes (Addendum)
[facility] PROGRESS NOTE    Tyrone Roman  ZHY:[id number] DOB: 1945/08/05 DOA: 02/29/2020 PCP: [name], MD      Brief Narrative:  Tyrone Roman is a 75 y.o. M with RA on Rituxan, CAD s/p PCI 2011, HTN, COPD not on O2, and MSSA bacteremia and 2019 who presented with progressive fatigue, weakness, dizziness.  Patient developed fatigue, dyspnea, and cough about 2 weeks ago, this progressed until 1 week ago he was diagnosed with Covid.  Finally on the day of admission his symptoms were to the point that he could not care for himself so he came to the ER.  In the ER, creatinine 2.0 from baseline 1.2, lactate elevated, platelets 70 1K.  Given chest pain and elevated dimer, he was started on empiric Lovenox, as well as remdesivir, and steroids and admitted for Covid.             Assessment & Plan:  Acute hypoxic respiratory failure due to Covid pneumonitis Patient was admitted with tachypnea, hypoxia to 79%, and dyspnea in the setting of Covid pneumonitis.  He is on immunosuppressants at baseline, baricitinib contraindicated, also explains his degree of pneumonitis in the setting of vaccination booster.  -Continue weaning oxygen  -Continue steroids -Continue remdesivir  -Incentive spirometer and flutter valve       ADDENDUM: Coag Negative Staph in blood culture Called this afternoon from Micro that both sets of Aerobic cultures are growing a CoNS by [facility].  Discussed with ID.  Given clinically improving with current therapy, will hold antibiotics for now.  Dr. West Bali will see in AM and make final determinration re: antibiotic therapy.     Chest pain, elevated dimer VQ scan low risk, LE duplex negative.   -Stop empiric therapeutic Lovenox  Dehydration AKI on CKD IIIa Baseline Cr 1.2, up to 2.0 here.  Improving with fluids. -Push oral fluids  Leukopenia Thrombocytopenia Due to COVID, no bleeding -Repeat CBC in 1 week  Rheumatoid  arthritis -Hold Rituxan and leflunomide  Coronary disease, secondary prevention Hypertension -Continue metoprolol -Hold statin  Transaminitis Mild -Hold statin at discharge  COPD No active flare  BPH -Continue Flomax        Disposition: Status is: Inpatient  Remains inpatient appropriate because:ongoing new O2 requirement, ongoing severe renal function deragment   Dispo: The patient is from: Home              Anticipated d/c is to: Home              Anticipated d/c date is: 1 day              Patient currently is not medically stable to d/c.   Difficult to place patient No   The patient was admitted with COVID-19, hypoxia, and acute kidney injury.  We will likely be able to wean him off oxygen today, and continue IV fluids over till tomorrow, and if off O2 and renal function better tomorrow, likely home.    Level of care: Med-Surg       MDM: The below labs and imaging reports were reviewed and summarized above.  Medication manageme follow-up nt as above.    DVT prophylaxis: enoxaparin (LOVENOX) injection 40 mg Start: 03/02/20 0800Lovenox  Code Status: FULL Family Communication: Called to daughter, no answer LVM            Subjective: Patient is tired, but his appetite is good, his mentation is good.  He has no chest pain, dyspnea  at rest, dizziness, vomiting.  He is still extremely weak, and unsteady on his feet, no fever.  Objective: Vitals:   03/01/20 0130 03/01/20 0530 03/01/20 0929 03/01/20 1357  BP: 108/86 118/80 131/80 105/90  Pulse: 77 71 73 72  Resp: (!) 23 (!) 21 18 16   Temp: 98.7 F (37.1 C) 98.2 F (36.8 C) 97.7 F (36.5 C) 97.7 F (36.5 C)  TempSrc: Oral Oral Oral   SpO2: 92% 94% 94% 95%  Weight:      Height:        Intake/Output Summary (Last 24 hours) at 03/01/2020 1504 Last data filed at 03/01/2020 0600 Gross per 24 hour  Intake 1144.41 ml  Output 350 ml  Net 794.41 ml   Filed Weights   02/29/20 1322  Weight:  98 kg    Examination: General appearance:  adult male, alert and in no acute distress.   HEENT: Anicteric, conjunctiva pink, lids and lashes normal. No nasal deformity, discharge, epistaxis.  Lips moist, oropharynx tacky dry, no oral lesions, dentition in good repair, hearing normal.   Skin: Warm and dry.  No jaundice.  No suspicious rashes or lesions. Cardiac: RRR, nl S1-S2, no murmurs appreciated.  Capillary refill is brisk.  jvp not visible.  No LE edema.  Radial pulses 2+ and symmetric. Respiratory: Normal respiratory rate and rhythm.  CTAB without rales or wheezes. Abdomen: Abdomen soft.  No TTP or guarding. No ascites, distension, hepatosplenomegaly.   MSK: No deformities or effusions. Neuro: Awake and alert.  EOMI, moves all extremities with severe generalized weakness. Speech fluent.    Psych: Sensorium intact and responding to questions, attention normal. Affect normal.  Judgment and insight appear normal.    Data Reviewed: I have personally reviewed following labs and imaging studies:  CBC: Recent Labs  Lab 02/29/20 1343 03/01/20 0403  WBC 4.5 2.8*  NEUTROABS 3.8 2.3  HGB 16.2 14.1  HCT 48.6 43.4  MCV 89.5 90.6  PLT 71* 62*   Basic Metabolic Panel: Recent Labs  Lab 02/29/20 1343 03/01/20 0403  NA 139 138  K 4.3 4.6  CL 104 109  CO2 22 19*  GLUCOSE 137* 177*  BUN 39* 42*  CREATININE 2.02* 1.68*  CALCIUM 10.0 8.9   GFR: Estimated Creatinine Clearance: 44.9 mL/min (A) (by C-G formula based on SCr of 1.68 mg/dL (H)). Liver Function Tests: Recent Labs  Lab 02/29/20 1343 03/01/20 0403  AST 79* 93*  ALT 30 29  ALKPHOS 32* 30*  BILITOT 0.9 0.9  PROT 7.1 6.1*  ALBUMIN 3.3* 3.0*   No results for input(s): LIPASE, AMYLASE in the last 168 hours. No results for input(s): AMMONIA in the last 168 hours. Coagulation Profile: No results for input(s): INR, PROTIME in the last 168 hours. Cardiac Enzymes: No results for input(s): CKTOTAL, CKMB, CKMBINDEX,  TROPONINI in the last 168 hours. BNP (last 3 results) No results for input(s): PROBNP in the last 8760 hours. HbA1C: No results for input(s): HGBA1C in the last 72 hours. CBG: No results for input(s): GLUCAP in the last 168 hours. Lipid Profile: Recent Labs    02/29/20 1343  TRIG 201*   Thyroid Function Tests: No results for input(s): TSH, T4TOTAL, FREET4, T3FREE, THYROIDAB in the last 72 hours. Anemia Panel: Recent Labs    02/29/20 1343  FERRITIN 1,981*   Urine analysis:    Component Value Date/Time   COLORURINE YELLOW 12/10/2017 1412   APPEARANCEUR HAZY (A) 12/10/2017 1412   LABSPEC 1.023 12/10/2017 Strong  5.0 12/10/2017 1412   GLUCOSEU NEGATIVE 12/10/2017 1412   HGBUR MODERATE (A) 12/10/2017 1412   HGBUR negative 01/26/2010 0852   BILIRUBINUR negative 08/24/2019 1520   KETONESUR NEGATIVE 12/10/2017 1412   PROTEINUR Positive (A) 08/24/2019 1520   PROTEINUR 100 (A) 12/10/2017 1412   UROBILINOGEN 0.2 08/24/2019 1520   UROBILINOGEN 0.2 01/26/2010 0852   NITRITE negative 08/24/2019 1520   NITRITE NEGATIVE 12/10/2017 1412   LEUKOCYTESUR Moderate (2+) (A) 08/24/2019 1520   Sepsis Labs: @LABRCNTIP (procalcitonin:4,lacticacidven:4)  ) Recent Results (from the past 240 hour(s))  Blood Culture (routine x 2)     Status: None (Preliminary result)   Collection Time: 02/29/20  1:43 PM   Specimen: BLOOD  Result Value Ref Range Status   Specimen Description   Final    BLOOD LEFT ANTECUBITAL Performed at [facility], [place] [address] [place], [place] [address]    Special Requests   Final    BOTTLES DRAWN AEROBIC AND ANAEROBIC Blood Culture adequate volume Performed at [place] [address] [place], [place] [address]    Culture  Setup Time   Final    GRAM POSITIVE COCCI IN CLUSTERS AEROBIC BOTTLE ONLY Performed at [facility], [place] [address], [place], [place] [address]    Culture PENDING  Incomplete   Report  Status PENDING  Incomplete  Blood Culture (routine x 2)     Status: None (Preliminary result)   Collection Time: 02/29/20  4:00 PM   Specimen: BLOOD  Result Value Ref Range Status   Specimen Description   Final    BLOOD LEFT ANTECUBITAL Performed at [facility] [address], [place], [place] [address]    Special Requests   Final    BOTTLES DRAWN AEROBIC AND ANAEROBIC Blood Culture adequate volume Performed at [facility] [address], [place], [place] [address]    Culture  Setup Time   Final    GRAM POSITIVE COCCI IN CLUSTERS AEROBIC BOTTLE ONLY Organism ID to follow Performed at [facility], [place] [address], [place], [place] [address]    Culture PENDING  Incomplete   Report Status PENDING  Incomplete         Radiology Studies: NM PULMONARY VENT AND PERF (V/Q Scan)  Result Date: 02/29/2020 CLINICAL DATA:  75 year old male with concern for pulmonary embolism. EXAM: NUCLEAR MEDICINE PERFUSION LUNG SCAN TECHNIQUE: Perfusion images were obtained in multiple projections after intravenous injection of radiopharmaceutical. Ventilation scans intentionally deferred if perfusion scan and chest x-ray adequate for interpretation during COVID 19 epidemic. RADIOPHARMACEUTICALS:  4.4 mCi Tc-28m MAA IV COMPARISON:  Chest radiograph dated 02/29/2020. FINDINGS: There is near homogeneous perfusion uptake. No large or segmental perfusion defects identified. IMPRESSION: Low probability for pulmonary embolism. Electronically Signed   By: Anner Crete M.D.   On: 02/29/2020 16:46   DG Chest Port 1 View  Result Date: 02/29/2020 CLINICAL DATA:  Hypoxia, COVID-19 positivity EXAM: PORTABLE CHEST 1 VIEW COMPARISON:  02/22/2020 FINDINGS: Cardiac shadow is stable. The lungs are well aerated bilaterally. Patchy airspace opacities are seen increased from the prior exam in the right mid lung and left base consistent with the given clinical history. IMPRESSION:  Slight increase in airspace opacities bilaterally consistent with the given clinical history. Electronically Signed   By: Inez Catalina M.D.   On: 02/29/2020 13:46   VAS [place] LOWER EXTREMITY VENOUS (DVT)  Result Date: 03/01/2020  Lower Venous DVT Study Indications: Elevated D-dimer in  covid+ patient.  Comparison Study: No prior exams. Performing Technologist: Rogelia Rohrer  Examination Guidelines: A complete evaluation includes B-mode imaging, spectral Doppler, color Doppler, and power Doppler as needed of all accessible portions of each vessel. Bilateral testing is considered an integral part of a complete examination. Limited examinations for reoccurring indications may be performed as noted. The reflux portion of the exam is performed with the patient in reverse Trendelenburg.  +---------+---------------+---------+-----------+----------+--------------+ RIGHT    CompressibilityPhasicitySpontaneityPropertiesThrombus Aging +---------+---------------+---------+-----------+----------+--------------+ CFV      Full           Yes      Yes                                 +---------+---------------+---------+-----------+----------+--------------+ SFJ      Full                                                        +---------+---------------+---------+-----------+----------+--------------+ FV Prox  Full           Yes      Yes                                 +---------+---------------+---------+-----------+----------+--------------+ FV Mid   Full           Yes      Yes                                 +---------+---------------+---------+-----------+----------+--------------+ FV DistalFull           Yes      Yes                                 +---------+---------------+---------+-----------+----------+--------------+ PFV      Full                                                        +---------+---------------+---------+-----------+----------+--------------+ POP      Full            Yes      Yes                                 +---------+---------------+---------+-----------+----------+--------------+ PTV      Full                                                        +---------+---------------+---------+-----------+----------+--------------+ PERO     Full                                                        +---------+---------------+---------+-----------+----------+--------------+   +---------+---------------+---------+-----------+----------+--------------+  LEFT     CompressibilityPhasicitySpontaneityPropertiesThrombus Aging +---------+---------------+---------+-----------+----------+--------------+ CFV      Full           Yes      Yes                                 +---------+---------------+---------+-----------+----------+--------------+ SFJ      Full                                                        +---------+---------------+---------+-----------+----------+--------------+ FV Prox  Full           Yes      Yes                                 +---------+---------------+---------+-----------+----------+--------------+ FV Mid   Full           Yes      Yes                                 +---------+---------------+---------+-----------+----------+--------------+ FV DistalFull           Yes      Yes                                 +---------+---------------+---------+-----------+----------+--------------+ PFV      Full                                                        +---------+---------------+---------+-----------+----------+--------------+ POP      Full           Yes      Yes                                 +---------+---------------+---------+-----------+----------+--------------+ PTV      Full                                                        +---------+---------------+---------+-----------+----------+--------------+ PERO     Full                                                         +---------+---------------+---------+-----------+----------+--------------+     Summary: BILATERAL: - No evidence of deep vein thrombosis seen in the lower extremities, bilaterally. -No evidence of popliteal cyst, bilaterally.   *See table(s) above for measurements and observations.    Preliminary         Scheduled Meds: . [START ON 03/02/2020] enoxaparin (LOVENOX) injection  40 mg Subcutaneous Q24H  . feeding supplement  237 mL Oral BID BM  . methylPREDNISolone (SOLU-MEDROL) injection  1 mg/kg/day Intravenous BID  . metoprolol succinate  25 mg Oral Daily  . tamsulosin  0.8 mg Oral Daily   Continuous Infusions: . sodium chloride    . remdesivir 100 mg in NS 100 mL 100 mg (03/01/20 1001)     LOS: 1 day    Time spent: 25 minutes    Edwin Dada, MD [facility] 03/01/2020, 3:04 PM     Please page though [place] or Epic secure chat:  For [employer], Adult nurse

## 2020-03-02 DIAGNOSIS — M069 Rheumatoid arthritis, unspecified: Secondary | ICD-10-CM

## 2020-03-02 DIAGNOSIS — U071 COVID-19: Secondary | ICD-10-CM | POA: Diagnosis not present

## 2020-03-02 DIAGNOSIS — B957 Other staphylococcus as the cause of diseases classified elsewhere: Secondary | ICD-10-CM | POA: Diagnosis not present

## 2020-03-02 DIAGNOSIS — Z9889 Other specified postprocedural states: Secondary | ICD-10-CM

## 2020-03-02 DIAGNOSIS — R7881 Bacteremia: Secondary | ICD-10-CM

## 2020-03-02 DIAGNOSIS — M5459 Other low back pain: Secondary | ICD-10-CM

## 2020-03-02 DIAGNOSIS — M0579 Rheumatoid arthritis with rheumatoid factor of multiple sites without organ or systems involvement: Secondary | ICD-10-CM

## 2020-03-02 DIAGNOSIS — J1282 Pneumonia due to coronavirus disease 2019: Secondary | ICD-10-CM

## 2020-03-02 LAB — COMPREHENSIVE METABOLIC PANEL
ALT: 38 U/L (ref 0–44)
AST: 120 U/L — ABNORMAL HIGH (ref 15–41)
Albumin: 2.9 g/dL — ABNORMAL LOW (ref 3.5–5.0)
Alkaline Phosphatase: 30 U/L — ABNORMAL LOW (ref 38–126)
Anion gap: 10 (ref 5–15)
BUN: 45 mg/dL — ABNORMAL HIGH (ref 8–23)
CO2: 20 mmol/L — ABNORMAL LOW (ref 22–32)
Calcium: 9.3 mg/dL (ref 8.9–10.3)
Chloride: 110 mmol/L (ref 98–111)
Creatinine, Ser: 1.31 mg/dL — ABNORMAL HIGH (ref 0.61–1.24)
GFR, Estimated: 57 mL/min — ABNORMAL LOW (ref >60)
Glucose, Bld: 213 mg/dL — ABNORMAL HIGH (ref 70–99)
Potassium: 4.4 mmol/L (ref 3.5–5.1)
Sodium: 140 mmol/L (ref 135–145)
Total Bilirubin: 0.7 mg/dL (ref 0.3–1.2)
Total Protein: 5.9 g/dL — ABNORMAL LOW (ref 6.5–8.1)

## 2020-03-02 LAB — CBC WITH DIFFERENTIAL/PLATELET
Abs Immature Granulocytes: 0.02 10*3/uL (ref 0.00–0.07)
Basophils Absolute: 0 10*3/uL (ref 0.0–0.1)
Basophils Relative: 0 %
Eosinophils Absolute: 0 10*3/uL (ref 0.0–0.5)
Eosinophils Relative: 0 %
HCT: 43 % (ref 39.0–52.0)
Hemoglobin: 14.3 g/dL (ref 13.0–17.0)
Immature Granulocytes: 0 %
Lymphocytes Relative: 5 %
Lymphs Abs: 0.2 10*3/uL — ABNORMAL LOW (ref 0.7–4.0)
MCH: 29.5 pg (ref 26.0–34.0)
MCHC: 33.3 g/dL (ref 30.0–36.0)
MCV: 88.7 fL (ref 80.0–100.0)
Monocytes Absolute: 0.2 10*3/uL (ref 0.1–1.0)
Monocytes Relative: 4 %
Neutro Abs: 4.2 10*3/uL (ref 1.7–7.7)
Neutrophils Relative %: 91 %
Platelets: 75 10*3/uL — ABNORMAL LOW (ref 150–400)
RBC: 4.85 MIL/uL (ref 4.22–5.81)
RDW: 13.4 % (ref 11.5–15.5)
WBC: 4.6 10*3/uL (ref 4.0–10.5)
nRBC: 0 % (ref 0.0–0.2)

## 2020-03-02 LAB — D-DIMER, QUANTITATIVE: D-Dimer, Quant: 2.4 ug/mL-FEU — ABNORMAL HIGH (ref 0.00–0.50)

## 2020-03-02 LAB — GLUCOSE, CAPILLARY
Glucose-Capillary: 239 mg/dL — ABNORMAL HIGH (ref 70–99)
Glucose-Capillary: 232 mg/dL — ABNORMAL HIGH (ref 70–99)

## 2020-03-02 LAB — C-REACTIVE PROTEIN: CRP: 8.1 mg/dL — ABNORMAL HIGH (ref <1.0)

## 2020-03-02 MED ORDER — GUAIFENESIN 100 MG/5ML PO SOLN
5.0000 mL | ORAL | Status: DC | PRN
  Administered 2020-03-02: 100 mg via ORAL
  Filled 2020-03-02: qty 10

## 2020-03-02 MED ORDER — INSULIN ASPART 100 UNIT/ML ~~LOC~~ SOLN
0.0000 [IU] | Freq: Three times a day (TID) | SUBCUTANEOUS | Status: DC
  Administered 2020-03-02: 5 [IU] via SUBCUTANEOUS
  Administered 2020-03-03: 11 [IU] via SUBCUTANEOUS
  Administered 2020-03-03: 5 [IU] via SUBCUTANEOUS

## 2020-03-02 NOTE — TOC Transition Note (Signed)
Transition of Care [facility]) - CM/SW Discharge Note   Patient Details  Name: DORON SHAKE MRN: [id number] Date of Birth: [birth year]  Transition [facility] CM/SW Contact:  Trish Mage, LCSW Phone Number: 03/02/2020, 10:03 AM   Clinical Narrative:   Patient seen in follow up to PT recommendation of [facility].  Mr Hetland states he was awoken by PT, and so he is not surprised that it was reported he was a little unsteady "because I did not have the chance to pace myself.  I know how to listen to my body."  Furthermore, he has an eliptical, treadmill and weights at home that he uses religiously and feels confident that with continued working out "at my pace" he will recover just fine without help.  I let him know if he has further questions or changes his mind he should reach out to me.  No further needs identified. TOC will continue to follow during the course of hospitalization.     Final next level of care: Home/Self Care Barriers to Discharge: No Barriers Identified   Patient Goals and CMS Choice        Discharge Placement                       Discharge Plan and Services                                     Social Determinants of Health (SDOH) Interventions     Readmission Risk Interventions No flowsheet data found.

## 2020-03-02 NOTE — Progress Notes (Signed)
PROGRESS NOTE  MACAI SISNEROS KZS:[id number] DOB: 07-02-1945 DOA: 02/29/2020 PCP: Nelwyn Salisbury, MD   LOS: 2 days   Brief narrative: Tyrone Roman is a 75 y.o.  male with past medical history of rheumatoid arthritis on Rituxan, CAD s/p PCI 2011, HTN, COPD not on O2, and MSSA bacteremia and 2019  presented to the hospital with progressive fatigue, weakness, dizziness.  He also had cough and dyspnea for 2 weeks.  In the ER patient was noted to have elevated creatinine levels of 2.0 from baseline.  Patient also had chest pain and elevated D-dimer and was started empirically on Lovenox.  Patient was given remdesivir and steroids and was admitted for Covid pneumonia.  Assessment/Plan:  Principal Problem:   Pneumonia due to COVID-19 virus Active Problems:   Hyperlipidemia   BPH with urinary obstruction   Rheumatoid arthritis (HCC)   AKI (acute kidney injury) (HCC)   Acute respiratory failure due to COVID-19 [facility])   Acute hypoxic respiratory failure due to Covid pneumonia On immunosuppressants at baseline.  Continue oxygen supplementation, IV steroids, remdesivir, incentive spirometry and flutter valve.  Unable to use baricitinib since the patient was on immunosuppressants.  Inflammatory markers are trending down.  Currently not on supplemental oxygen.  COVID-19 Labs  Recent Labs    02/29/20 1343 03/01/20 0403 03/02/20 0402  DDIMER 12.50* 7.20* 2.40*  FERRITIN 1,981*  --   --   LDH 425*  --   --   CRP 10.1* 11.9* 8.1*    No results found for: SARSCOV2NAA  Coagulase-negative staph in blood culture in both bottles.  Seen by infectious disease.  Recommended repeating blood cultures again today.  History of bacteremia PICC line placement and antibiotic in the past.  Chest pain likely atypical.  Duplex of the lower extremity negative.  VQ scan low risk.  AKI on CKD IIIa Likely secondary to volume depletion.  Baseline Cr 1.2, up to 2.0 during hospitalization..  Improved with IV fluids.   Encourage oral intake.  Leukopenia, thrombocytopenia Likely secondary to Covid illness.  Will monitor.  Rheumatoid arthritis Hold Rituxan and leflunomide  Coronary disease, hypertension Continue metoprolol, statin on hold at this time.  Elevated LFTs. Mild, statin on hold at this time  COPD No active flare.  Continue inhalers.  BPH Continue Flomax   DVT prophylaxis: enoxaparin (LOVENOX) injection 40 mg Start: 03/02/20 0800   Code Status: Full code  Family Communication:  I called the patient's daughter Sheria Lang and updated her about the clinical condition of the patient.   Status is: Inpatient  Remains inpatient appropriate because:IV treatments appropriate due to intensity of illness or inability to take PO, Inpatient level of care appropriate due to severity of illness and Infectious disease evaluation  Dispo: The patient is from: Home              Anticipated d/c is to: Home              Anticipated d/c date is: 2 days, wait for repeat blood cultures.              Patient currently is not medically stable to d/c.   Difficult to place patient No  Consultants:  Infectious disease  Procedures:  None  Anti-infectives:    Remdesivir 1/24>  Anti-infectives (From admission, onward)   Start     Dose/Rate Route Frequency Ordered Stop   03/01/20 1000  remdesivir 100 mg in sodium chloride 0.9 % 100 mL IVPB       "  Followed by" Linked Group Details   100 mg 200 mL/hr over 30 Minutes Intravenous Daily 02/29/20 1811 03/05/20 0959   02/29/20 1845  remdesivir 200 mg in sodium chloride 0.9% 250 mL IVPB       "Followed by" Linked Group Details   200 mg 580 mL/hr over 30 Minutes Intravenous Once 02/29/20 1811 02/29/20 2029     Subjective: Today, patient was seen and examined at bedside.  Patient is states that he feels fine.  Denies any chest pain, dizziness lightheadedness but has mild dyspnea on exertion.  Objective: Vitals:   03/01/20 2014 03/02/20 0516   BP: 130/87 128/86  Pulse: 67 77  Resp: 19 18  Temp: 97.7 F (36.5 C) 98.4 F (36.9 C)  SpO2: 90% 91%    Intake/Output Summary (Last 24 hours) at 03/02/2020 1356 Last data filed at 03/02/2020 0600 Gross per 24 hour  Intake 1230.18 ml  Output --  Net 1230.18 ml   Filed Weights   02/29/20 1322  Weight: 98 kg   Body mass index is 27.74 kg/m.   Physical Exam: GENERAL: Patient is alert awake and oriented. Not in obvious distress. HENT: No scleral pallor or icterus. Pupils equally reactive to light. Oral mucosa is moist NECK: is supple, no gross swelling noted. CHEST:   Diminished breath sounds bilaterally. CVS: S1 and S2 heard, no murmur. Regular rate and rhythm.  ABDOMEN: Soft, non-tender, bowel sounds are present. EXTREMITIES: No edema. CNS: Cranial nerves are intact. No focal motor deficits. SKIN: warm and dry  Data Review: I have personally reviewed the following laboratory data and studies,  CBC: Recent Labs  Lab 02/29/20 1343 03/01/20 0403 03/02/20 0402  WBC 4.5 2.8* 4.6  NEUTROABS 3.8 2.3 4.2  HGB 16.2 14.1 14.3  HCT 48.6 43.4 43.0  MCV 89.5 90.6 88.7  PLT 71* 62* 75*   Basic Metabolic Panel: Recent Labs  Lab 02/29/20 1343 03/01/20 0403 03/02/20 0402  NA 139 138 140  K 4.3 4.6 4.4  CL 104 109 110  CO2 22 19* 20*  GLUCOSE 137* 177* 213*  BUN 39* 42* 45*  CREATININE 2.02* 1.68* 1.31*  CALCIUM 10.0 8.9 9.3   Liver Function Tests: Recent Labs  Lab 02/29/20 1343 03/01/20 0403 03/02/20 0402  AST 79* 93* 120*  ALT 30 29 38  ALKPHOS 32* 30* 30*  BILITOT 0.9 0.9 0.7  PROT 7.1 6.1* 5.9*  ALBUMIN 3.3* 3.0* 2.9*   No results for input(s): LIPASE, AMYLASE in the last 168 hours. No results for input(s): AMMONIA in the last 168 hours. Cardiac Enzymes: No results for input(s): CKTOTAL, CKMB, CKMBINDEX, TROPONINI in the last 168 hours. BNP (last 3 results) No results for input(s): BNP in the last 8760 hours.  ProBNP (last 3 results) No results for  input(s): PROBNP in the last 8760 hours.  CBG: No results for input(s): GLUCAP in the last 168 hours. Recent Results (from the past 240 hour(s))  Blood Culture (routine x 2)     Status: Abnormal (Preliminary result)   Collection Time: 02/29/20  1:43 PM   Specimen: BLOOD  Result Value Ref Range Status   Specimen Description   Final    BLOOD LEFT ANTECUBITAL Performed at [facility], [address] [place], [place] [address]    Special Requests   Final    BOTTLES DRAWN AEROBIC AND ANAEROBIC Blood Culture adequate volume Performed at [facility], [address] [place], [place] [address]    Culture  Setup Time   Final    GRAM POSITIVE COCCI IN CLUSTERS AEROBIC BOTTLE ONLY CRITICAL VALUE NOTED.  VALUE IS CONSISTENT WITH PREVIOUSLY REPORTED AND CALLED VALUE. Performed at [facility], [address], [place], [place] [address]    Culture (A)  Final    STAPHYLOCOCCUS HOMINIS STAPHYLOCOCCUS EPIDERMIDIS    Report Status PENDING  Incomplete  Blood Culture (routine x 2)     Status: Abnormal (Preliminary result)   Collection Time: 02/29/20  4:00 PM   Specimen: BLOOD  Result Value Ref Range Status   Specimen Description   Final    BLOOD LEFT ANTECUBITAL Performed at [facility], [address], [place], [place] [address]    Special Requests   Final    BOTTLES DRAWN AEROBIC AND ANAEROBIC Blood Culture adequate volume Performed at [facility], [address] [place], [place] [address]    Culture  Setup Time   Final    GRAM POSITIVE COCCI IN CLUSTERS AEROBIC BOTTLE ONLY CRITICAL RESULT CALLED TO, READ BACK BY AND VERIFIED WITH: Fenton Foy PharmD 15:30 03/01/20 ([email or web address])    Culture (A)  Final    STAPHYLOCOCCUS HOMINIS SUSCEPTIBILITIES TO FOLLOW Performed at [facility], [address], [place], [place] [address]    Report Status PENDING  Incomplete  Blood Culture ID Panel (Reflexed)      Status: Abnormal   Collection Time: 02/29/20  4:00 PM  Result Value Ref Range Status   Enterococcus faecalis NOT DETECTED NOT DETECTED Final   Enterococcus Faecium NOT DETECTED NOT DETECTED Final   Listeria monocytogenes NOT DETECTED NOT DETECTED Final   Staphylococcus species DETECTED (A) NOT DETECTED Final    Comment: CRITICAL RESULT CALLED TO, READ BACK BY AND VERIFIED WITH: Fenton Foy PharmD 15:30 03/01/20 ([email or web address])    Staphylococcus aureus (BCID) NOT DETECTED NOT DETECTED Final   Staphylococcus epidermidis NOT DETECTED NOT DETECTED Final   Staphylococcus lugdunensis NOT DETECTED NOT DETECTED Final   Streptococcus species NOT DETECTED NOT DETECTED Final   Streptococcus agalactiae NOT DETECTED NOT DETECTED Final   Streptococcus pneumoniae NOT DETECTED NOT DETECTED Final   Streptococcus pyogenes NOT DETECTED NOT DETECTED Final   A.calcoaceticus-baumannii NOT DETECTED NOT DETECTED Final   Bacteroides fragilis NOT DETECTED NOT DETECTED Final   Enterobacterales NOT DETECTED NOT DETECTED Final   Enterobacter cloacae complex NOT DETECTED NOT DETECTED Final   Escherichia coli NOT DETECTED NOT DETECTED Final   Klebsiella aerogenes NOT DETECTED NOT DETECTED Final   Klebsiella oxytoca NOT DETECTED NOT DETECTED Final   Klebsiella pneumoniae NOT DETECTED NOT DETECTED Final   Proteus species NOT DETECTED NOT DETECTED Final   Salmonella species NOT DETECTED NOT DETECTED Final   Serratia marcescens NOT DETECTED NOT DETECTED Final   Haemophilus influenzae NOT DETECTED NOT DETECTED Final   Neisseria meningitidis NOT DETECTED NOT DETECTED Final   Pseudomonas aeruginosa NOT DETECTED NOT DETECTED Final   Stenotrophomonas maltophilia NOT DETECTED NOT DETECTED Final   Candida albicans NOT DETECTED NOT DETECTED Final   Candida auris NOT DETECTED NOT DETECTED Final   Candida glabrata NOT DETECTED NOT DETECTED Final   Candida krusei NOT DETECTED NOT DETECTED Final   Candida parapsilosis NOT DETECTED NOT  DETECTED Final   Candida tropicalis NOT DETECTED NOT DETECTED Final   Cryptococcus neoformans/gattii NOT DETECTED NOT DETECTED Final    Comment: Performed at [facility], [address] [place], [place] [address]     Studies: NM PULMONARY VENT AND  PERF (V/Q Scan)  Result Date: 02/29/2020 CLINICAL DATA:  75 year old male with concern for pulmonary embolism. EXAM: NUCLEAR MEDICINE PERFUSION LUNG SCAN TECHNIQUE: Perfusion images were obtained in multiple projections after intravenous injection of radiopharmaceutical. Ventilation scans intentionally deferred if perfusion scan and chest x-ray adequate for interpretation during COVID 19 epidemic. RADIOPHARMACEUTICALS:  4.4 mCi Tc-28m MAA IV COMPARISON:  Chest radiograph dated 02/29/2020. FINDINGS: There is near homogeneous perfusion uptake. No large or segmental perfusion defects identified. IMPRESSION: Low probability for pulmonary embolism. Electronically Signed   By: Anner Crete M.D.   On: 02/29/2020 16:46   VAS [place] LOWER EXTREMITY VENOUS (DVT)  Result Date: 03/01/2020  Lower Venous DVT Study Indications: Elevated D-dimer in covid+ patient.  Comparison Study: No prior exams. Performing Technologist: Rogelia Rohrer  Examination Guidelines: A complete evaluation includes B-mode imaging, spectral Doppler, color Doppler, and power Doppler as needed of all accessible portions of each vessel. Bilateral testing is considered an integral part of a complete examination. Limited examinations for reoccurring indications may be performed as noted. The reflux portion of the exam is performed with the patient in reverse Trendelenburg.  +---------+---------------+---------+-----------+----------+--------------+  RIGHT     Compressibility Phasicity Spontaneity Properties Thrombus Aging  +---------+---------------+---------+-----------+----------+--------------+  CFV       Full            Yes       Yes                                     +---------+---------------+---------+-----------+----------+--------------+  SFJ       Full                                                             +---------+---------------+---------+-----------+----------+--------------+  FV Prox   Full            Yes       Yes                                    +---------+---------------+---------+-----------+----------+--------------+  FV Mid    Full            Yes       Yes                                    +---------+---------------+---------+-----------+----------+--------------+  FV Distal Full            Yes       Yes                                    +---------+---------------+---------+-----------+----------+--------------+  PFV       Full                                                             +---------+---------------+---------+-----------+----------+--------------+  POP  Full            Yes       Yes                                    +---------+---------------+---------+-----------+----------+--------------+  PTV       Full                                                             +---------+---------------+---------+-----------+----------+--------------+  PERO      Full                                                             +---------+---------------+---------+-----------+----------+--------------+   +---------+---------------+---------+-----------+----------+--------------+  LEFT      Compressibility Phasicity Spontaneity Properties Thrombus Aging  +---------+---------------+---------+-----------+----------+--------------+  CFV       Full            Yes       Yes                                    +---------+---------------+---------+-----------+----------+--------------+  SFJ       Full                                                             +---------+---------------+---------+-----------+----------+--------------+  FV Prox   Full            Yes       Yes                                     +---------+---------------+---------+-----------+----------+--------------+  FV Mid    Full            Yes       Yes                                    +---------+---------------+---------+-----------+----------+--------------+  FV Distal Full            Yes       Yes                                    +---------+---------------+---------+-----------+----------+--------------+  PFV       Full                                                             +---------+---------------+---------+-----------+----------+--------------+  POP       Full            Yes       Yes                                    +---------+---------------+---------+-----------+----------+--------------+  PTV       Full                                                             +---------+---------------+---------+-----------+----------+--------------+  PERO      Full                                                             +---------+---------------+---------+-----------+----------+--------------+     Summary: BILATERAL: - No evidence of deep vein thrombosis seen in the lower extremities, bilaterally. -No evidence of popliteal cyst, bilaterally.   *See table(s) above for measurements and observations. Electronically signed by Deitra Mayo MD on 03/01/2020 at 4:14:30 PM.    Final       Flora Lipps, MD  [facility] 03/02/2020  If 7PM-7AM, please contact night-coverage

## 2020-03-02 NOTE — Consult Note (Addendum)
[facility]                                                                                        Patient Identification: Patient Name: Tyrone Roman MRN: [id number] [place] Date: 02/29/2020  1:13 PM Today's Date: 03/02/2020 Reason for consult: CoNS Bacteremia   Principal Problem:   Pneumonia due to COVID-19 virus Active Problems:   Hyperlipidemia   BPH with urinary obstruction   Rheumatoid arthritis ([place])   AKI (acute kidney injury) ([place])   Acute respiratory failure due to COVID-19 [facility])   Antibiotics: None   Lines/Tubes: PIVs  Assessment # COVID PNA #CoNS bacteremia in both sets  - I did not see any hardwares on chart review, on taking with patient and review of prior OR notes and Imagings   # Chronic Back pain with  H/o lumbar laminectomy and microdiscectomy for recurrent L1 disc herniation   # H/o MSSA Bacteremia in the setting of post operative Lumbar wound Infection s/p completion of treatment in 02/2018  # RA on immunosuppressants   Recommendations  1. Repeat blood cx today. No indication of abx currently. He is not septic 2. COVID PNA management per primary 3. Isolation precautions per IP  4. If repeat blood cx are negative in 48 hrs, no further ID work up indicated as he is stable otherwise   Rest of the management as per the primary team. Please call with questions or concerns.  Thank you for the consult ____________________________________________________________________________________________________ HPI and Hospital Course: 75 year old male with past medical history of rheumatoid arthritis on immunosuppressants, hyperlipidemia, CAD status post PCI, GERD who presented to the ED on 02/29/2020 with complaints of not being able to take care of himself for approximately 2 weeks before presentation. He also had generalized weakness, lightheadedness and dyspnea.  He was seen 6  days prior to that in the ED with symptoms of decreased appetites, body ache, cough, chest pain/SOB, fever and diarrhea with a known COVID positive status.Marland Kitchen He was discharged from the ED same day.  He was tested positive for COVID ibn 1/17 and had completed 12 days of prednisone for COVID which was prescribed when he was seen in the clinic.   In the ED, he was afebrile, tachycardic, tachypneic and needed to be on 2 L nasal cannula. Work-up remarkable for creatinine 2.02 lactic acid 3, procalcitonin less than 0.1, platelets 71,000, chest x-ray significant for multifocal pneumonia.  He was started on remdesivir and steroids with oxygen supplementation.  Patient was thought to be clinically improving.  ID consulted for coagulase-negative staph growing in 2 sets of blood cultures.  ROS: General- Denies fever, chills, loss of appetite and loss of weight HEENT - Denies headache, blurry vision, neck pain, sinus pain Chest - Denies any chest pain, SOB or cough CVS- Denies any dizziness/lightheadedness, syncopal attacks, palpitations Abdomen- Denies any nausea, vomiting, abdominal pain, hematochezia and diarrhea Neuro - Denies any weakness, numbness, tingling sensation Psych - Denies any changes in mood irritability or depressive symptoms GU- Denies any burning, dysuria, hematuria or increased frequency of urination Skin - denies any rashes/lesions MSK - denies  any joint pain/swelling or restricted ROM, CHRONIC BACK PAIN   Past Medical History:  Diagnosis Date  . Allergy   . Barrett's esophageal ulceration   . BPH (benign prostatic hyperplasia)   . CAD (coronary artery disease)    sees Dr. Mar Daring  . Cataract    both eyes removed   . Colonic polyp   . Concussion with loss of consciousness of 30 minutes or less   . Contact dermatitis   . Contact with or exposure to venereal diseases   . COPD (chronic obstructive pulmonary disease) ([place])    sees Dr. Kara Mead   . Diverticulosis of colon    . Dysphagia   . GERD (gastroesophageal reflux disease)    past hx- on meds   . HNP (herniated nucleus pulposus), lumbar    recurrent  . Hyperlipidemia   . Hypertension   . Laceration of scalp   . Myocardial infarction [facility])    2011- stent placed  . Nephrolithiasis   . Pre-operative cardiovascular examination   . Rheumatoid arthritis [facility])    sees Dr. Gavin Pound   . SOB (shortness of breath)    Past Surgical History:  Procedure Laterality Date  . BACK SURGERY    . CARDIAC CATHETERIZATION N/A 01/19/2016   Procedure: Left Heart Cath and Coronary Angiography;  Surgeon: Burnell Blanks, MD;  Location: [facility];  Service: Cardiovascular;  Laterality: N/A;  . COLONOSCOPY  12/09/2014   per Dr. Silverio Decamp, adenomatous polyps, repeat 3 years   . CORONARY ANGIOPLASTY WITH STENT PLACEMENT    . ESOPHAGOGASTRODUODENOSCOPY (EGD) WITH ESOPHAGEAL DILATION  02/17/09   Barretts esophagus   . EYE SURGERY    . KNEE ARTHROSCOPY Left X 2  . LUMBAR LAMINECTOMY/DECOMPRESSION MICRODISCECTOMY Right 10/07/2017   Procedure: RIGHT LUMBAR ONE-TWO LAMINECTOMY WITH MICRODISCECTOMY;  Surgeon: Consuella Lose, MD;  Location: [facility];  Service: Neurosurgery;  Laterality: Right;  . LUMBAR LAMINECTOMY/DECOMPRESSION MICRODISCECTOMY N/A 12/06/2017   Procedure: RECURRENT MICRODISCECTOMY LUMBAR ONE - LUMBAR TWO;  Surgeon: Consuella Lose, MD;  Location: [facility];  Service: Neurosurgery;  Laterality: N/A;  . LUMBAR WOUND DEBRIDEMENT N/A 12/11/2017   Procedure: LUMBAR WOUND DEBRIDEMENT;  Surgeon: Consuella Lose, MD;  Location: [facility];  Service: Neurosurgery;  Laterality: N/A;  . POLYPECTOMY    . TONSILLECTOMY    . UPPER GASTROINTESTINAL ENDOSCOPY       Scheduled Meds: . enoxaparin (LOVENOX) injection  40 mg Subcutaneous Q24H  . feeding supplement  237 mL Oral BID BM  . methylPREDNISolone (SOLU-MEDROL) injection  1 mg/kg/day Intravenous BID  . metoprolol succinate  25 mg Oral Daily  . tamsulosin   0.8 mg Oral Daily   Continuous Infusions: . remdesivir 100 mg in NS 100 mL 100 mg (03/01/20 1001)     PRN Meds:.acetaminophen, HYDROcodone-acetaminophen, ondansetron **OR** ondansetron (ZOFRAN) IV, zolpidem   Allergies  Allergen Reactions  . Cephalexin Shortness Of Breath, Rash and Other (See Comments)    Tolerated Augmentin 2015 and 2017 (12-2017). Tolerated nafcillin 12/2017 PATIENT HAS HAD A PCN REACTION WITH IMMEDIATE RASH, FACIAL/TONGUE/THROAT SWELLING, SOB, OR LIGHTHEADEDNESS WITH HYPOTENSION:  #  #  YES  #  #  Has patient had a PCN reaction causing severe rash involving mucus membranes or skin necrosis: No Has patient had a PCN reaction that required hospitalization: No Has patient had a PCN reaction occurring within the last 10 years: No If all of the above answers are "NO", then may proceed  . Clarithromycin Shortness Of Breath and  Rash  . Certolizumab Pegol Hives  . Tocilizumab   . Doxycycline Rash  . Hydroxyzine Rash  . Lidoderm Other (See Comments)    "made me act weird"  . Pyrithione Zinc Rash   Social History   Socioeconomic History  . Marital status: Divorced    Spouse name: Not on file  . Number of children: 4  . Years of education: Not on file  . Highest education level: Not on file  Occupational History  . Occupation: ENGINEER    Employer: [employer]  Tobacco Use  . Smoking status: Former Smoker    Packs/day: 1.00    Years: 20.00    Pack years: 20.00    Types: Cigarettes    Quit date: 10/12/1988    Years since quitting: 31.4  . Smokeless tobacco: Never Used  . Tobacco comment: 1960's   Vaping Use  . Vaping Use: Never used  Substance and Sexual Activity  . Alcohol use: Yes    Alcohol/week: 0.0 standard drinks    Comment: rare  . Drug use: No  . Sexual activity: Not on file  Other Topics Concern  . 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        04/14/2018:   Lives alone, has girlfriend who visits, main source of emotional support   Has been struggling with recent hospitalizations/ill health following sepsis and back surgeries   Social Determinants of Health   Financial Resource Strain: Low Risk   . Difficulty of Paying Living Expenses: Not hard at all  Food Insecurity: No Food Insecurity  . Worried About Charity fundraiser in the Last Year: Never true  . Ran Out of Food in the Last Year: Never true  Transportation Needs: No Transportation Needs  . Lack of Transportation (Medical): No  . Lack of Transportation (Non-Medical): No  Physical Activity: Insufficiently Active  . Days of Exercise per Week: 2 days  . Minutes of Exercise per Session: 60 min  Stress: Stress Concern Present  . Feeling of Stress : To some extent  Social Connections: Moderately Isolated  . Frequency of Communication with Friends and Family: More than three times a week  . Frequency of Social Gatherings with Friends and Family: Once a week  . Attends Religious Services: Never  . Active Member of Clubs or Organizations: Yes  . Attends Archivist Meetings: More than 4 times per year  . Marital Status: Divorced  Human resources officer Violence: Not At Risk  . Fear of Current or Ex-Partner: No  . Emotionally Abused: No  . Physically Abused: No  . Sexually Abused: No   Vitals BP 128/86 (BP Location: Right Arm)   Pulse 77   Temp 98.4 F (36.9 C) (Oral)   Resp 18   Ht 6\' 2"  (1.88 m)   Wt 98 kg   SpO2 91%   BMI 27.74 kg/m     Physical Exam Constitutional:  Elderly male sitting up in bed, appears to be comfortable in room air    Comments:   Cardiovascular:     Rate and Rhythm: Normal rate and regular rhythm.     Heart sounds: No murmur heard.   Pulmonary:     Effort: Pulmonary effort is normal.     Comments: few rales +  Abdominal:     Palpations: Abdomen is soft.     Tenderness: Non tender   Musculoskeletal:  General: No  swelling or tenderness. No spinal tenderness  Skin:    Comments: no obvious lesions or rashes   Neurological:     General: No focal deficit present.   Psychiatric:        Mood and Affect: Mood normal.    Pertinent Microbiology Results for orders placed or performed during the hospital encounter of 02/29/20  Blood Culture (routine x 2)     Status: None (Preliminary result)   Collection Time: 02/29/20  1:43 PM   Specimen: BLOOD  Result Value Ref Range Status   Specimen Description   Final    BLOOD LEFT ANTECUBITAL Performed at [facility] [address] [place], [place] [address]    Special Requests   Final    BOTTLES DRAWN AEROBIC AND ANAEROBIC Blood Culture adequate volume Performed at [facility] [address], [place], [place] [address]    Culture  Setup Time   Final    GRAM POSITIVE COCCI IN CLUSTERS AEROBIC BOTTLE ONLY CRITICAL VALUE NOTED.  VALUE IS CONSISTENT WITH PREVIOUSLY REPORTED AND CALLED VALUE. Performed at [facility], [place] [address] [place], [place] [address]    Culture GRAM POSITIVE COCCI  Final   Report Status PENDING  Incomplete  Blood Culture (routine x 2)     Status: None (Preliminary result)   Collection Time: 02/29/20  4:00 PM   Specimen: BLOOD  Result Value Ref Range Status   Specimen Description   Final    BLOOD LEFT ANTECUBITAL Performed at [facility] [address], [place], [place] [address]    Special Requests   Final    BOTTLES DRAWN AEROBIC AND ANAEROBIC Blood Culture adequate volume Performed at [facility] [address], [place], [place] [address]    Culture  Setup Time   Final    GRAM POSITIVE COCCI IN CLUSTERS AEROBIC BOTTLE ONLY CRITICAL RESULT CALLED TO, READ BACK BY AND VERIFIED WITH: Karie Kirks PharmD 15:30 03/01/20 ([email or web address]) Performed at [facility], [place] [address], [place], [place] [address]    Culture GRAM POSITIVE COCCI  Final    Report Status PENDING  Incomplete  Blood Culture ID Panel (Reflexed)     Status: Abnormal   Collection Time: 02/29/20  4:00 PM  Result Value Ref Range Status   Enterococcus faecalis NOT DETECTED NOT DETECTED Final   Enterococcus Faecium NOT DETECTED NOT DETECTED Final   Listeria monocytogenes NOT DETECTED NOT DETECTED Final   Staphylococcus species DETECTED (A) NOT DETECTED Final    Comment: CRITICAL RESULT CALLED TO, READ BACK BY AND VERIFIED WITH: Karie Kirks PharmD 15:30 03/01/20 ([email or web address])    Staphylococcus aureus (BCID) NOT DETECTED NOT DETECTED Final   Staphylococcus epidermidis NOT DETECTED NOT DETECTED Final   Staphylococcus lugdunensis NOT DETECTED NOT DETECTED Final   Streptococcus species NOT DETECTED NOT DETECTED Final   Streptococcus agalactiae NOT DETECTED NOT DETECTED Final   Streptococcus pneumoniae NOT DETECTED NOT DETECTED Final   Streptococcus pyogenes NOT DETECTED NOT DETECTED Final   A.calcoaceticus-baumannii NOT DETECTED NOT DETECTED Final   Bacteroides fragilis NOT DETECTED NOT DETECTED Final   Enterobacterales NOT DETECTED NOT DETECTED Final   Enterobacter cloacae complex NOT DETECTED NOT DETECTED Final   Escherichia coli NOT DETECTED NOT DETECTED Final   Klebsiella aerogenes NOT DETECTED NOT DETECTED Final   Klebsiella oxytoca NOT DETECTED NOT DETECTED Final   Klebsiella pneumoniae NOT DETECTED NOT DETECTED Final   Proteus species NOT DETECTED  NOT DETECTED Final   Salmonella species NOT DETECTED NOT DETECTED Final   Serratia marcescens NOT DETECTED NOT DETECTED Final   Haemophilus influenzae NOT DETECTED NOT DETECTED Final   Neisseria meningitidis NOT DETECTED NOT DETECTED Final   Pseudomonas aeruginosa NOT DETECTED NOT DETECTED Final   Stenotrophomonas maltophilia NOT DETECTED NOT DETECTED Final   Candida albicans NOT DETECTED NOT DETECTED Final   Candida auris NOT DETECTED NOT DETECTED Final   Candida glabrata NOT DETECTED NOT DETECTED Final   Candida  krusei NOT DETECTED NOT DETECTED Final   Candida parapsilosis NOT DETECTED NOT DETECTED Final   Candida tropicalis NOT DETECTED NOT DETECTED Final   Cryptococcus neoformans/gattii NOT DETECTED NOT DETECTED Final    Comment: Performed at [facility], [address], [place], [place] [address]    Pertinent Lab seen by me: CBC Latest Ref Rng & Units 03/02/2020 03/01/2020 02/29/2020  WBC 4.0 - 10.5 K/uL 4.6 2.8(L) 4.5  Hemoglobin 13.0 - 17.0 g/dL 14.3 14.1 16.2  Hematocrit 39.0 - 52.0 % 43.0 43.4 48.6  Platelets 150 - 400 K/uL 75(L) 62(L) 71(L)   CMP Latest Ref Rng & Units 03/02/2020 03/01/2020 02/29/2020  Glucose 70 - 99 mg/dL 213(H) 177(H) 137(H)  BUN 8 - 23 mg/dL 45(H) 42(H) 39(H)  Creatinine 0.61 - 1.24 mg/dL 1.31(H) 1.68(H) 2.02(H)  Sodium 135 - 145 mmol/L 140 138 139  Potassium 3.5 - 5.1 mmol/L 4.4 4.6 4.3  Chloride 98 - 111 mmol/L 110 109 104  CO2 22 - 32 mmol/L 20(L) 19(L) 22  Calcium 8.9 - 10.3 mg/dL 9.3 8.9 10.0  Total Protein 6.5 - 8.1 g/dL 5.9(L) 6.1(L) 7.1  Total Bilirubin 0.3 - 1.2 mg/dL 0.7 0.9 0.9  Alkaline Phos 38 - 126 U/L 30(L) 30(L) 32(L)  AST 15 - 41 U/L 120(H) 93(H) 79(H)  ALT 0 - 44 U/L 38 29 30    Pertinent Imagings/Other Imagings Plain films and CT images have been personally visualized and interpreted; radiology reports have been reviewed. Decision making incorporated into the Impression / Recommendations.  Chest Xray 02/29/20 FINDINGS: Cardiac shadow is stable. The lungs are well aerated bilaterally. Patchy airspace opacities are seen increased from the prior exam in the right mid lung and left base consistent with the given clinical history.  IMPRESSION: Slight increase in airspace opacities bilaterally consistent with the given clinical history.   I have spent greater than 60 minutes for this patient encounter including review of prior medical records with greater than 50% of time being face to face and coordination of their  care.  Electronically signed by:   Rosiland Oz, MD Infectious Disease Physician [facility] for Infectious Disease Pager: [phone]

## 2020-03-03 DIAGNOSIS — J1282 Pneumonia due to coronavirus disease 2019: Secondary | ICD-10-CM | POA: Diagnosis not present

## 2020-03-03 DIAGNOSIS — U071 COVID-19: Secondary | ICD-10-CM | POA: Diagnosis not present

## 2020-03-03 LAB — COMPREHENSIVE METABOLIC PANEL
ALT: 37 U/L (ref 0–44)
AST: 89 U/L — ABNORMAL HIGH (ref 15–41)
Albumin: 2.6 g/dL — ABNORMAL LOW (ref 3.5–5.0)
Alkaline Phosphatase: 30 U/L — ABNORMAL LOW (ref 38–126)
Anion gap: 7 (ref 5–15)
BUN: 39 mg/dL — ABNORMAL HIGH (ref 8–23)
CO2: 20 mmol/L — ABNORMAL LOW (ref 22–32)
Calcium: 8.9 mg/dL (ref 8.9–10.3)
Chloride: 107 mmol/L (ref 98–111)
Creatinine, Ser: 1.42 mg/dL — ABNORMAL HIGH (ref 0.61–1.24)
GFR, Estimated: 52 mL/min — ABNORMAL LOW (ref >60)
Glucose, Bld: 242 mg/dL — ABNORMAL HIGH (ref 70–99)
Potassium: 4.7 mmol/L (ref 3.5–5.1)
Sodium: 134 mmol/L — ABNORMAL LOW (ref 135–145)
Total Bilirubin: 1 mg/dL (ref 0.3–1.2)
Total Protein: 5.4 g/dL — ABNORMAL LOW (ref 6.5–8.1)

## 2020-03-03 LAB — CBC WITH DIFFERENTIAL/PLATELET
Abs Immature Granulocytes: 0.04 10*3/uL (ref 0.00–0.07)
Basophils Absolute: 0 10*3/uL (ref 0.0–0.1)
Basophils Relative: 0 %
Eosinophils Absolute: 0 10*3/uL (ref 0.0–0.5)
Eosinophils Relative: 0 %
HCT: 40.5 % (ref 39.0–52.0)
Hemoglobin: 14 g/dL (ref 13.0–17.0)
Immature Granulocytes: 1 %
Lymphocytes Relative: 4 %
Lymphs Abs: 0.2 10*3/uL — ABNORMAL LOW (ref 0.7–4.0)
MCH: 30 pg (ref 26.0–34.0)
MCHC: 34.6 g/dL (ref 30.0–36.0)
MCV: 86.7 fL (ref 80.0–100.0)
Monocytes Absolute: 0.3 10*3/uL (ref 0.1–1.0)
Monocytes Relative: 5 %
Neutro Abs: 4.9 10*3/uL (ref 1.7–7.7)
Neutrophils Relative %: 90 %
Platelets: 94 10*3/uL — ABNORMAL LOW (ref 150–400)
RBC: 4.67 MIL/uL (ref 4.22–5.81)
RDW: 13.3 % (ref 11.5–15.5)
WBC: 5.4 10*3/uL (ref 4.0–10.5)
nRBC: 0 % (ref 0.0–0.2)

## 2020-03-03 LAB — GLUCOSE, CAPILLARY
Glucose-Capillary: 221 mg/dL — ABNORMAL HIGH (ref 70–99)
Glucose-Capillary: 320 mg/dL — ABNORMAL HIGH (ref 70–99)

## 2020-03-03 LAB — CULTURE, BLOOD (ROUTINE X 2)
Special Requests: ADEQUATE
Special Requests: ADEQUATE

## 2020-03-03 LAB — D-DIMER, QUANTITATIVE: D-Dimer, Quant: 1.51 ug/mL-FEU — ABNORMAL HIGH (ref 0.00–0.50)

## 2020-03-03 LAB — C-REACTIVE PROTEIN: CRP: 4.1 mg/dL — ABNORMAL HIGH (ref <1.0)

## 2020-03-03 MED ORDER — PREDNISONE 5 MG PO TABS: 5.0000 mg | ORAL_TABLET | Freq: Every day | ORAL | Status: DC

## 2020-03-03 MED ORDER — PREDNISONE 10 MG PO TABS: 40.0000 mg | ORAL_TABLET | Freq: Every day | ORAL | 0 refills | Status: AC

## 2020-03-03 NOTE — Progress Notes (Signed)
Patient will be discharged this afternoon. Education on medications will be provided.

## 2020-03-03 NOTE — Progress Notes (Signed)
[facility] Infectious Diseases Follow Up Note  Patient Identification: Patient Name: Tyrone Roman MRN: [id number] [place] Date: 02/29/2020  1:13 PM Age: 75 y.o.Today's Date: 03/03/2020   Reason for Visit: Positive blood cultures   Principal Problem:   Pneumonia due to COVID-19 virus Active Problems:   Hyperlipidemia   BPH with urinary obstruction   Rheumatoid arthritis ([place])   AKI (acute kidney injury) ([place])   Acute respiratory failure due to COVID-19 [facility])  Antibiotics: None   Lines/Tubes: PIVs  Assessment # COVID PNA # CoNS positive blood cultures ( Staph hominis in 2/4 bottles and Staph epidermidis in 1/4 bottles)  # Chronic Back pain with  H/o lumbar laminectomy and microdiscectomy for recurrent L1 disc herniation   # H/o MSSA Bacteremia in the setting of post operative Lumbar wound Infection s/p completion of treatment in 02/2018  # RA on immunosuppressants   Recommendations  Patient appears to be stable with no new complaints. Blood cultures reported as staph hominis in 2/4 bottles and Staph epidermidis in 1/4 bottles and possibly these are contaminants. No further ID work up indicated at this time   Rest of the management as per the primary team. Thank you for the consult. Please page with pertinent questions or concerns.  ______________________________________________________________________ Subjective patient seen and examined at the bedside. He is eating his breakfast and feels comfortable. No new complaints   Vitals BP 129/81 (BP Location: Left Arm)   Pulse 60   Temp 97.8 F (36.6 C) (Oral)   Resp 17   Ht 6\' 2"  (1.88 m)   Wt 98 kg   SpO2 94%   BMI 27.74 kg/m     Physical Exam Constitutional: Elderly male sitting up in bed, appears to be comfortable in room air Comments:   Cardiovascular:  Rate and Rhythm: Normal rateand regular rhythm.  Heart sounds: No murmurheard.    Pulmonary:  Effort: Pulmonary effort is normal.  Comments: few rales +  Abdominal:  Palpations: Abdomen is soft.  Tenderness: Non tender   Musculoskeletal:  General: No swellingor tenderness. No spinal tenderness  Skin: Comments: no obvious lesions or rashes   Neurological:  General: No focal deficitpresent.   Psychiatric:  Mood and Affect: Moodnormal.   Pertinent Microbiology Results for orders placed or performed during the hospital encounter of 02/29/20  Blood Culture (routine x 2)     Status: Abnormal (Preliminary result)   Collection Time: 02/29/20  1:43 PM   Specimen: BLOOD  Result Value Ref Range Status   Specimen Description   Final    BLOOD LEFT ANTECUBITAL Performed at [facility] [address], [place], [place] [address]    Special Requests   Final    BOTTLES DRAWN AEROBIC AND ANAEROBIC Blood Culture adequate volume Performed at [facility] [address], [place], [place] [address]    Culture  Setup Time   Final    GRAM POSITIVE COCCI IN CLUSTERS AEROBIC BOTTLE ONLY CRITICAL VALUE NOTED.  VALUE IS CONSISTENT WITH PREVIOUSLY REPORTED AND CALLED VALUE.    Culture (A)  Final    STAPHYLOCOCCUS HOMINIS SUSCEPTIBILITIES PERFORMED ON PREVIOUS CULTURE WITHIN THE LAST 5 DAYS. STAPHYLOCOCCUS EPIDERMIDIS CULTURE REINCUBATED FOR BETTER GROWTH Performed at [facility], [place] [address], [place], [place] [address]    Report Status PENDING  Incomplete  Blood Culture (routine x 2)     Status: Abnormal   Collection Time: 02/29/20  4:00 PM   Specimen: BLOOD  Result Value Ref  Range Status   Specimen Description   Final    BLOOD LEFT ANTECUBITAL Performed at [facility] [address] [place], [place] [address]    Special Requests   Final    BOTTLES DRAWN AEROBIC AND ANAEROBIC Blood Culture adequate volume Performed at [facility]  [address] [place], [place] [address]    Culture  Setup Time   Final    GRAM POSITIVE COCCI IN CLUSTERS AEROBIC BOTTLE ONLY CRITICAL RESULT CALLED TO, READ BACK BY AND VERIFIED WITH: Karie Kirks PharmD 15:30 03/01/20 ([email or web address]) Performed at [facility], [place] [address] [place], [place] [address]    Culture STAPHYLOCOCCUS HOMINIS (A)  Final   Report Status 03/03/2020 FINAL  Final   Organism ID, Bacteria STAPHYLOCOCCUS HOMINIS  Final      Susceptibility   Staphylococcus hominis - MIC*    CIPROFLOXACIN <=0.5 SENSITIVE Sensitive     ERYTHROMYCIN >=8 RESISTANT Resistant     GENTAMICIN <=0.5 SENSITIVE Sensitive     OXACILLIN <=0.25 SENSITIVE Sensitive     TETRACYCLINE <=1 SENSITIVE Sensitive     VANCOMYCIN 1 SENSITIVE Sensitive     TRIMETH/SULFA <=10 SENSITIVE Sensitive     CLINDAMYCIN <=0.25 SENSITIVE Sensitive     RIFAMPIN <=0.5 SENSITIVE Sensitive     Inducible Clindamycin NEGATIVE Sensitive     * STAPHYLOCOCCUS HOMINIS  Blood Culture ID Panel (Reflexed)     Status: Abnormal   Collection Time: 02/29/20  4:00 PM  Result Value Ref Range Status   Enterococcus faecalis NOT DETECTED NOT DETECTED Final   Enterococcus Faecium NOT DETECTED NOT DETECTED Final   Listeria monocytogenes NOT DETECTED NOT DETECTED Final   Staphylococcus species DETECTED (A) NOT DETECTED Final    Comment: CRITICAL RESULT CALLED TO, READ BACK BY AND VERIFIED WITH: Karie Kirks PharmD 15:30 03/01/20 ([email or web address])    Staphylococcus aureus (BCID) NOT DETECTED NOT DETECTED Final   Staphylococcus epidermidis NOT DETECTED NOT DETECTED Final   Staphylococcus lugdunensis NOT DETECTED NOT DETECTED Final   Streptococcus species NOT DETECTED NOT DETECTED Final   Streptococcus agalactiae NOT DETECTED NOT DETECTED Final   Streptococcus pneumoniae NOT DETECTED NOT DETECTED Final   Streptococcus pyogenes NOT DETECTED NOT DETECTED Final   A.calcoaceticus-baumannii NOT DETECTED NOT DETECTED Final   Bacteroides fragilis NOT DETECTED NOT  DETECTED Final   Enterobacterales NOT DETECTED NOT DETECTED Final   Enterobacter cloacae complex NOT DETECTED NOT DETECTED Final   Escherichia coli NOT DETECTED NOT DETECTED Final   Klebsiella aerogenes NOT DETECTED NOT DETECTED Final   Klebsiella oxytoca NOT DETECTED NOT DETECTED Final   Klebsiella pneumoniae NOT DETECTED NOT DETECTED Final   Proteus species NOT DETECTED NOT DETECTED Final   Salmonella species NOT DETECTED NOT DETECTED Final   Serratia marcescens NOT DETECTED NOT DETECTED Final   Haemophilus influenzae NOT DETECTED NOT DETECTED Final   Neisseria meningitidis NOT DETECTED NOT DETECTED Final   Pseudomonas aeruginosa NOT DETECTED NOT DETECTED Final   Stenotrophomonas maltophilia NOT DETECTED NOT DETECTED Final   Candida albicans NOT DETECTED NOT DETECTED Final   Candida auris NOT DETECTED NOT DETECTED Final   Candida glabrata NOT DETECTED NOT DETECTED Final   Candida krusei NOT DETECTED NOT DETECTED Final   Candida parapsilosis NOT DETECTED NOT DETECTED Final   Candida tropicalis NOT DETECTED NOT DETECTED Final   Cryptococcus neoformans/gattii NOT DETECTED NOT DETECTED Final    Comment: Performed at [facility], [place]. [address], [place], [place] [address]  Pertinent Lab. CBC Latest Ref Rng & Units 03/03/2020 03/02/2020 03/01/2020  WBC 4.0 - 10.5 K/uL 5.4 4.6 2.8(L)  Hemoglobin 13.0 - 17.0 g/dL 14.0 14.3 14.1  Hematocrit 39.0 - 52.0 % 40.5 43.0 43.4  Platelets 150 - 400 K/uL 94(L) 75(L) 62(L)   CMP Latest Ref Rng & Units 03/03/2020 03/02/2020 03/01/2020  Glucose 70 - 99 mg/dL 242(H) 213(H) 177(H)  BUN 8 - 23 mg/dL 39(H) 45(H) 42(H)  Creatinine 0.61 - 1.24 mg/dL 1.42(H) 1.31(H) 1.68(H)  Sodium 135 - 145 mmol/L 134(L) 140 138  Potassium 3.5 - 5.1 mmol/L 4.7 4.4 4.6  Chloride 98 - 111 mmol/L 107 110 109  CO2 22 - 32 mmol/L 20(L) 20(L) 19(L)  Calcium 8.9 - 10.3 mg/dL 8.9 9.3 8.9  Total Protein 6.5 - 8.1 g/dL 5.4(L) 5.9(L) 6.1(L)  Total Bilirubin 0.3 - 1.2 mg/dL  1.0 0.7 0.9  Alkaline Phos 38 - 126 U/L 30(L) 30(L) 30(L)  AST 15 - 41 U/L 89(H) 120(H) 93(H)  ALT 0 - 44 U/L 37 38 29    Pertinent Imaging today Plain films and CT images have been personally visualized and interpreted; radiology reports have been reviewed. Decision making incorporated into the Impression / Recommendations.  I have spent approx 30 minutes for this patient encounter including review of prior medical records with greater than 50% of time being face to face and coordination of their care.  Electronically signed by:   Rosiland Oz, MD Infectious Disease Physician [facility] for Infectious Disease Pager: [phone]

## 2020-03-03 NOTE — Care Management Important Message (Signed)
Important Message  Patient Details IM Letter placed in Patient's door Caddy. Name: [place] MRN: [id number] Date of Birth: 09/21/1945   Medicare Important Message Given:  Yes     Kerin Salen 03/03/2020, 10:48 AM

## 2020-03-03 NOTE — Progress Notes (Signed)
Inpatient Diabetes Program Recommendations  AACE/ADA: New Consensus Statement on Inpatient Glycemic Control (2015)  Target Ranges:  Prepandial:   less than 140 mg/dL      Peak postprandial:   less than 180 mg/dL (1-2 hours)      Critically ill patients:  140 - 180 mg/dL   Lab Results  Component Value Date   GLUCAP 320 (H) 03/03/2020   HGBA1C 6.0 05/11/2019    Review of Glycemic Control Results for YOGESH, COMINSKY (MRN [id number]) as of 03/03/2020 11:57  Ref. Range 03/02/2020 18:02 03/02/2020 21:12 03/03/2020 08:20 03/03/2020 11:30  Glucose-Capillary Latest Ref Range: 70 - 99 mg/dL 239 (H) 232 (H) 221 (H) 320 (H)   Diabetes history:  PreDM Outpatient Diabetes medications:  None Current orders for Inpatient glycemic control:  Novolog 0-15 units TID Solumedrol 48.75 mg BID   Inpatient Diabetes Program Recommendations:    CBG's elevated in the setting of steroids.  If steroids continue, might consider:  Levemir 7 units BID (0.15 units/kg) Novolog 3 units TID with meals if post prandials remain elevated  Will continue to follow while inpatient.  Thank you, Reche Dixon, RN, BSN Diabetes Coordinator Inpatient Diabetes Program [phone] (team pager from 8a-5p)

## 2020-03-03 NOTE — Plan of Care (Signed)

## 2020-03-03 NOTE — Discharge Summary (Signed)
Physician Discharge Summary  [name] [id number] DOB: [birth year] DOA: 02/29/2020  PCP: [name], MD  Admit date: 02/29/2020 Discharge date: 03/03/2020  Admitted From: Home  Discharge disposition: Home  Recommendations for Outpatient Follow-Up:   . Follow up with your primary care provider in one week.  . Check CBC, BMP, magnesium in the next visit  Discharge Diagnosis:   Principal Problem:   Pneumonia due to COVID-19 virus Active Problems:   Hyperlipidemia   BPH with urinary obstruction   Rheumatoid arthritis ([place])   AKI (acute kidney injury) ([place])   Acute respiratory failure due to COVID-19 ([place]) Coagulase-negative bacteremia   Discharge Condition: Improved.  Diet recommendation: Low sodium, heart healthy.   Wound care: None.  Code status: Full.   History of Present Illness:   [name] a 75 y.o. male with past medical history of rheumatoid arthritis on Rituxan, CAD s/pPCI 2011, HTN, COPD not on O2, history of  MSSA bacteremia in 2019  presented to the hospital with progressive fatigue, weakness, dizziness.  He also had cough and dyspnea for 2 weeks.  In the ER, patient was noted to have elevated creatinine levels of 2.0 from baseline.  Patient also had chest pain and elevated D-dimer and was started empirically on Lovenox.  Patient was given remdesivir and steroids and was admitted for Covid pneumonia.   Hospital Course:   Following conditions were addressed during hospitalization as listed below,  Acute hypoxic respiratory failure due to Covid pneumonia On immunosuppressants at baseline.    Patient received IV steroids remdesivir but was unable to use baricitinib since the patient was on immunosuppressants.  Inflammatory markers trended down.  Patient was not on supplemental oxygen prior to discharge.  Coagulase-negative staph bacteremia.  Seen by infectious disease.  Blood cultures were repeated. ID did not recommend antibiotics.  History  of MSSA bacteremia  and antibiotic in the past.  Chest pain likely atypical.  Venous Duplex of the lower extremity negative.  VQ scan showed low risk.  AKI on CKD IIIa Likely secondary to volume depletion.  Baseline Cr 1.2, up to 2.0 during hospitalization.  Improved with IV fluids.  Latest creatinine of 1.4 prior to discharge.  Leukopenia, thrombocytopenia Likely secondary to Covid illness.   Improved.  Rheumatoid arthritis Patient will resume Rituxan and leflunomide on discharge  Coronary disease, hypertension Continuemetoprolol and statin   Elevated LFTs. Mild, okay to resume on discharge  COPD No active flare during hospitalization.  Continue inhalers.  BPH ContinueFlomax  Disposition.  At this time, patient is stable for disposition home with outpatient PCP follow-up.  Medical Consultants:    Infectious disease  Procedures:    None Subjective:   Today, patient was seen and examined at bedside.  Patient denies any dizziness lightheadedness shortness of breath fever chills or rigor.  Wishes to go home strongly.  Discharge Exam:   Vitals:   03/03/20 0449 03/03/20 1430  BP: 129/81 (!) 107/93  Pulse: 60 76  Resp: 17 18  Temp: 97.8 F (36.6 C) (!) 97.4 F (36.3 C)  SpO2: 94%    Vitals:   03/02/20 1416 03/02/20 2112 03/03/20 0449 03/03/20 1430  BP: 122/87 135/89 129/81 (!) 107/93  Pulse: 74 75 60 76  Resp: 18 19 17 18   Temp: (!) 97.5 F (36.4 C) 98.3 F (36.8 C) 97.8 F (36.6 C) (!) 97.4 F (36.3 C)  TempSrc: Oral Oral Oral Oral  SpO2: 95% 92% 94%   Weight:  Height:       General: Alert awake, not in obvious distress HENT: pupils equally reacting to light,  No scleral pallor or icterus noted. Oral mucosa is moist.  Chest:    Diminished breath sounds bilaterally. No crackles or wheezes.  CVS: S1 &S2 heard. No murmur.  Regular rate and rhythm. Abdomen: Soft, nontender, nondistended.  Bowel sounds are heard.   Extremities: No cyanosis,  clubbing or edema.  Peripheral pulses are palpable. Psych: Alert, awake and oriented, normal mood CNS:  No cranial nerve deficits.  Power equal in all extremities.   Skin: Warm and dry.  No rashes noted.  The results of significant diagnostics from this hospitalization (including imaging, microbiology, ancillary and laboratory) are listed below for reference.     Diagnostic Studies:   DG Chest Port 1 View  Result Date: 02/29/2020 CLINICAL DATA:  Hypoxia, COVID-19 positivity EXAM: PORTABLE CHEST 1 VIEW COMPARISON:  02/22/2020 FINDINGS: Cardiac shadow is stable. The lungs are well aerated bilaterally. Patchy airspace opacities are seen increased from the prior exam in the right mid lung and left base consistent with the given clinical history. IMPRESSION: Slight increase in airspace opacities bilaterally consistent with the given clinical history. Electronically Signed   By: Inez Catalina M.D.   On: 02/29/2020 13:46   VAS [place] LOWER EXTREMITY VENOUS (DVT)  Result Date: 03/01/2020  Lower Venous DVT Study Indications: Elevated D-dimer in covid+ patient.  Comparison Study: No prior exams. Performing Technologist: Rogelia Rohrer  Examination Guidelines: A complete evaluation includes B-mode imaging, spectral Doppler, color Doppler, and power Doppler as needed of all accessible portions of each vessel. Bilateral testing is considered an integral part of a complete examination. Limited examinations for reoccurring indications may be performed as noted. The reflux portion of the exam is performed with the patient in reverse Trendelenburg.  +---------+---------------+---------+-----------+----------+--------------+ RIGHT    CompressibilityPhasicitySpontaneityPropertiesThrombus Aging +---------+---------------+---------+-----------+----------+--------------+ CFV      Full           Yes      Yes                                 +---------+---------------+---------+-----------+----------+--------------+ SFJ       Full                                                        +---------+---------------+---------+-----------+----------+--------------+ FV Prox  Full           Yes      Yes                                 +---------+---------------+---------+-----------+----------+--------------+ FV Mid   Full           Yes      Yes                                 +---------+---------------+---------+-----------+----------+--------------+ FV DistalFull           Yes      Yes                                 +---------+---------------+---------+-----------+----------+--------------+  PFV      Full                                                        +---------+---------------+---------+-----------+----------+--------------+ POP      Full           Yes      Yes                                 +---------+---------------+---------+-----------+----------+--------------+ PTV      Full                                                        +---------+---------------+---------+-----------+----------+--------------+ PERO     Full                                                        +---------+---------------+---------+-----------+----------+--------------+   +---------+---------------+---------+-----------+----------+--------------+ LEFT     CompressibilityPhasicitySpontaneityPropertiesThrombus Aging +---------+---------------+---------+-----------+----------+--------------+ CFV      Full           Yes      Yes                                 +---------+---------------+---------+-----------+----------+--------------+ SFJ      Full                                                        +---------+---------------+---------+-----------+----------+--------------+ FV Prox  Full           Yes      Yes                                 +---------+---------------+---------+-----------+----------+--------------+ FV Mid   Full           Yes      Yes                                  +---------+---------------+---------+-----------+----------+--------------+ FV DistalFull           Yes      Yes                                 +---------+---------------+---------+-----------+----------+--------------+ PFV      Full                                                        +---------+---------------+---------+-----------+----------+--------------+  POP      Full           Yes      Yes                                 +---------+---------------+---------+-----------+----------+--------------+ PTV      Full                                                        +---------+---------------+---------+-----------+----------+--------------+ PERO     Full                                                        +---------+---------------+---------+-----------+----------+--------------+     Summary: BILATERAL: - No evidence of deep vein thrombosis seen in the lower extremities, bilaterally. -No evidence of popliteal cyst, bilaterally.   *See table(s) above for measurements and observations. Electronically signed by Deitra Mayo MD on 03/01/2020 at 4:14:30 PM.    Final      Labs:   Basic Metabolic Panel: Recent Labs  Lab 02/29/20 1343 03/01/20 0403 03/02/20 0402 03/03/20 0355  NA 139 138 140 134*  K 4.3 4.6 4.4 4.7  CL 104 109 110 107  CO2 22 19* 20* 20*  GLUCOSE 137* 177* 213* 242*  BUN 39* 42* 45* 39*  CREATININE 2.02* 1.68* 1.31* 1.42*  CALCIUM 10.0 8.9 9.3 8.9   GFR Estimated Creatinine Clearance: 53.1 mL/min (A) (by C-G formula based on SCr of 1.42 mg/dL (H)). Liver Function Tests: Recent Labs  Lab 02/29/20 1343 03/01/20 0403 03/02/20 0402 03/03/20 0355  AST 79* 93* 120* 89*  ALT 30 29 38 37  ALKPHOS 32* 30* 30* 30*  BILITOT 0.9 0.9 0.7 1.0  PROT 7.1 6.1* 5.9* 5.4*  ALBUMIN 3.3* 3.0* 2.9* 2.6*   No results for input(s): LIPASE, AMYLASE in the last 168 hours. No results for input(s): AMMONIA in the last 168  hours. Coagulation profile No results for input(s): INR, PROTIME in the last 168 hours.  CBC: Recent Labs  Lab 02/29/20 1343 03/01/20 0403 03/02/20 0402 03/03/20 0355  WBC 4.5 2.8* 4.6 5.4  NEUTROABS 3.8 2.3 4.2 4.9  HGB 16.2 14.1 14.3 14.0  HCT 48.6 43.4 43.0 40.5  MCV 89.5 90.6 88.7 86.7  PLT 71* 62* 75* 94*   Cardiac Enzymes: No results for input(s): CKTOTAL, CKMB, CKMBINDEX, TROPONINI in the last 168 hours. BNP: Invalid input(s): POCBNP CBG: Recent Labs  Lab 03/02/20 1802 03/02/20 2112 03/03/20 0820 03/03/20 1130  GLUCAP 239* 232* 221* 320*   D-Dimer Recent Labs    03/02/20 0402 03/03/20 0355  DDIMER 2.40* 1.51*   Hgb A1c No results for input(s): HGBA1C in the last 72 hours. Lipid Profile No results for input(s): CHOL, HDL, LDLCALC, TRIG, CHOLHDL, LDLDIRECT in the last 72 hours. Thyroid function studies No results for input(s): TSH, T4TOTAL, T3FREE, THYROIDAB in the last 72 hours.  Invalid input(s): FREET3 Anemia work up No results for input(s): VITAMINB12, FOLATE, FERRITIN, TIBC, IRON, RETICCTPCT in the last 72 hours. Microbiology Recent Results (from the past 240 hour(s))  Blood Culture (routine x 2)  Status: Abnormal   Collection Time: 02/29/20  1:43 PM   Specimen: BLOOD  Result Value Ref Range Status   Specimen Description   Final    BLOOD LEFT ANTECUBITAL Performed at [facility] [address], [place], [place] [address]    Special Requests   Final    BOTTLES DRAWN AEROBIC AND ANAEROBIC Blood Culture adequate volume Performed at [facility] [address] [place], [place] [address]    Culture  Setup Time   Final    GRAM POSITIVE COCCI IN CLUSTERS AEROBIC BOTTLE ONLY CRITICAL VALUE NOTED.  VALUE IS CONSISTENT WITH PREVIOUSLY REPORTED AND CALLED VALUE.    Culture (A)  Final    STAPHYLOCOCCUS HOMINIS SUSCEPTIBILITIES PERFORMED ON PREVIOUS CULTURE WITHIN THE LAST 5 DAYS. STAPHYLOCOCCUS  EPIDERMIDIS STAPHYLOCOCCUS CAPITIS THE SIGNIFICANCE OF ISOLATING THIS ORGANISM FROM A SINGLE SET OF BLOOD CULTURES WHEN MULTIPLE SETS ARE DRAWN IS UNCERTAIN. PLEASE NOTIFY THE MICROBIOLOGY DEPARTMENT WITHIN ONE WEEK IF SPECIATION AND SENSITIVITIES ARE REQUIRED. Performed at [facility], [place] [address], [place], [place] [address]    Report Status 03/03/2020 FINAL  Final  Blood Culture (routine x 2)     Status: Abnormal   Collection Time: 02/29/20  4:00 PM   Specimen: BLOOD  Result Value Ref Range Status   Specimen Description   Final    BLOOD LEFT ANTECUBITAL Performed at [facility] [address] [place], [place] [address]    Special Requests   Final    BOTTLES DRAWN AEROBIC AND ANAEROBIC Blood Culture adequate volume Performed at [facility] [address] [place], [place] [address]    Culture  Setup Time   Final    GRAM POSITIVE COCCI IN CLUSTERS AEROBIC BOTTLE ONLY CRITICAL RESULT CALLED TO, READ BACK BY AND VERIFIED WITH: Karie Kirks PharmD 15:30 03/01/20 ([email or web address]) Performed at [facility], [place] [address] [place], [place] [address]    Culture STAPHYLOCOCCUS HOMINIS (A)  Final   Report Status 03/03/2020 FINAL  Final   Organism ID, Bacteria STAPHYLOCOCCUS HOMINIS  Final      Susceptibility   Staphylococcus hominis - MIC*    CIPROFLOXACIN <=0.5 SENSITIVE Sensitive     ERYTHROMYCIN >=8 RESISTANT Resistant     GENTAMICIN <=0.5 SENSITIVE Sensitive     OXACILLIN <=0.25 SENSITIVE Sensitive     TETRACYCLINE <=1 SENSITIVE Sensitive     VANCOMYCIN 1 SENSITIVE Sensitive     TRIMETH/SULFA <=10 SENSITIVE Sensitive     CLINDAMYCIN <=0.25 SENSITIVE Sensitive     RIFAMPIN <=0.5 SENSITIVE Sensitive     Inducible Clindamycin NEGATIVE Sensitive     * STAPHYLOCOCCUS HOMINIS  Blood Culture ID Panel (Reflexed)     Status: Abnormal   Collection Time: 02/29/20  4:00 PM  Result Value Ref Range Status   Enterococcus faecalis NOT DETECTED NOT  DETECTED Final   Enterococcus Faecium NOT DETECTED NOT DETECTED Final   Listeria monocytogenes NOT DETECTED NOT DETECTED Final   Staphylococcus species DETECTED (A) NOT DETECTED Final    Comment: CRITICAL RESULT CALLED TO, READ BACK BY AND VERIFIED WITH: Karie Kirks PharmD 15:30 03/01/20 ([email or web address])    Staphylococcus aureus (BCID) NOT DETECTED NOT DETECTED Final   Staphylococcus epidermidis NOT DETECTED NOT DETECTED Final   Staphylococcus lugdunensis NOT DETECTED NOT DETECTED Final   Streptococcus species NOT DETECTED NOT DETECTED Final   Streptococcus agalactiae NOT DETECTED NOT DETECTED Final   Streptococcus pneumoniae NOT DETECTED NOT DETECTED Final  Streptococcus pyogenes NOT DETECTED NOT DETECTED Final   A.calcoaceticus-baumannii NOT DETECTED NOT DETECTED Final   Bacteroides fragilis NOT DETECTED NOT DETECTED Final   Enterobacterales NOT DETECTED NOT DETECTED Final   Enterobacter cloacae complex NOT DETECTED NOT DETECTED Final   Escherichia coli NOT DETECTED NOT DETECTED Final   Klebsiella aerogenes NOT DETECTED NOT DETECTED Final   Klebsiella oxytoca NOT DETECTED NOT DETECTED Final   Klebsiella pneumoniae NOT DETECTED NOT DETECTED Final   Proteus species NOT DETECTED NOT DETECTED Final   Salmonella species NOT DETECTED NOT DETECTED Final   Serratia marcescens NOT DETECTED NOT DETECTED Final   Haemophilus influenzae NOT DETECTED NOT DETECTED Final   Neisseria meningitidis NOT DETECTED NOT DETECTED Final   Pseudomonas aeruginosa NOT DETECTED NOT DETECTED Final   Stenotrophomonas maltophilia NOT DETECTED NOT DETECTED Final   Candida albicans NOT DETECTED NOT DETECTED Final   Candida auris NOT DETECTED NOT DETECTED Final   Candida glabrata NOT DETECTED NOT DETECTED Final   Candida krusei NOT DETECTED NOT DETECTED Final   Candida parapsilosis NOT DETECTED NOT DETECTED Final   Candida tropicalis NOT DETECTED NOT DETECTED Final   Cryptococcus neoformans/gattii NOT DETECTED NOT DETECTED  Final    Comment: Performed at [facility], [address], [place], [place] [address]  Culture, blood (routine x 2)     Status: None (Preliminary result)   Collection Time: 03/02/20 10:42 AM   Specimen: BLOOD LEFT HAND  Result Value Ref Range Status   Specimen Description   Final    BLOOD LEFT HAND Performed at [facility] [address] [place], [place] [address]    Special Requests   Final    BOTTLES DRAWN AEROBIC AND ANAEROBIC Blood Culture adequate volume Performed at [facility] [address], [place], [place] [address]    Culture   Final    NO GROWTH 1 DAY Performed at [facility], [place] [address] [place], [place] [address]    Report Status PENDING  Incomplete  Culture, blood (routine x 2)     Status: None (Preliminary result)   Collection Time: 03/02/20 10:50 AM   Specimen: BLOOD LEFT HAND  Result Value Ref Range Status   Specimen Description   Final    BLOOD LEFT HAND Performed at [facility] [address], [place], [place] [address]    Special Requests   Final    BOTTLES DRAWN AEROBIC AND ANAEROBIC Blood Culture adequate volume Performed at [facility] [address], [place], [place] [address]    Culture   Final    NO GROWTH 1 DAY Performed at [facility], [place] [address], [place], [place] [address]    Report Status PENDING  Incomplete     Discharge Instructions:   Discharge Instructions    Call MD for:  temperature >100.4   Complete by: As directed    Diet - low sodium heart healthy   Complete by: As directed    Discharge instructions   Complete by: As directed    Follow-up with your primary care physician in 1 week.  Check blood work at that time.  Complete the 40 mg dose course of prednisone then continue your home prednisone..   Increase activity slowly   Complete by: As directed      Allergies as of 03/03/2020      Reactions   Cephalexin  Shortness Of Breath, Rash, Other (See Comments)   Tolerated  Augmentin 2015 and 2017 (12-2017). Tolerated nafcillin 12/2017 PATIENT HAS HAD A PCN REACTION WITH IMMEDIATE RASH, FACIAL/TONGUE/THROAT SWELLING, SOB, OR LIGHTHEADEDNESS WITH HYPOTENSION:  #  #  YES  #  #  Has patient had a PCN reaction causing severe rash involving mucus membranes or skin necrosis: No Has patient had a PCN reaction that required hospitalization: No Has patient had a PCN reaction occurring within the last 10 years: No If all of the above answers are "NO", then may proceed   Clarithromycin Shortness Of Breath, Rash   Certolizumab Pegol Hives   Tocilizumab    Doxycycline Rash   Hydroxyzine Rash   Lidoderm Other (See Comments)   "made me act weird"   Pyrithione Zinc Rash      Medication List    STOP taking these medications   acetaminophen 325 MG tablet Commonly known as: TYLENOL   ciprofloxacin 500 MG tablet Commonly known as: Cipro   traMADol 50 MG tablet Commonly known as: ULTRAM     TAKE these medications   aspirin EC 81 MG tablet Take 1 tablet (81 mg total) by mouth daily.   metoprolol succinate 25 MG 24 hr tablet Commonly known as: TOPROL-XL Take 1 tablet (25 mg total) by mouth daily.   nitroGLYCERIN 0.4 MG SL tablet Commonly known as: NITROSTAT Place 1 tablet (0.4 mg total) under the tongue every 5 (five) minutes as needed for chest pain (3 doses max).   omeprazole 40 MG capsule Commonly known as: PRILOSEC Take 1 capsule (40 mg total) by mouth daily.   potassium chloride SA 20 MEQ tablet Commonly known as: Klor-Con M20 Take 1 tablet (20 mEq total) by mouth daily.   predniSONE 10 MG tablet Commonly known as: DELTASONE Take 4 tablets (40 mg total) by mouth daily with breakfast for 7 days. What changed: You were already taking a medication with the same name, and this prescription was added. Make sure you understand how and when to take each.   predniSONE 5 MG tablet Commonly known  as: DELTASONE Take 1 tablet (5 mg total) by mouth daily. Start taking on: March 10, 2020 What changed: These instructions start on March 10, 2020. If you are unsure what to do until then, ask your doctor or other care provider.   Rituxan 500 MG/50ML injection Generic drug: riTUXimab Inject into the vein. Every 4 months   sildenafil 20 MG tablet Commonly known as: REVATIO TAKE ONE TABLET BY MOUTH DAILY AS NEEDED What changed:   when to take this  additional instructions   simvastatin 40 MG tablet Commonly known as: ZOCOR TAKE ONE TABLET BY MOUTH DAILY   tamsulosin 0.4 MG Caps capsule Commonly known as: FLOMAX Take 2 capsules (0.8 mg total) by mouth daily.   zolpidem 10 MG tablet Commonly known as: AMBIEN TAKE ONE TABLET BY MOUTH EVERY NIGHT AT BEDTIME       Follow-up Information    [name], MD Follow up.   Specialty: Family Medicine Contact information: [place] [address] [phone]                Time coordinating discharge: 39 minutes  Signed:  Tymir Terral  [facility] 03/03/2020, 3:01 PM

## 2020-03-04 ENCOUNTER — Telehealth: Payer: Medicare Other

## 2020-03-07 LAB — CULTURE, BLOOD (ROUTINE X 2)
Culture: NO GROWTH
Culture: NO GROWTH
Special Requests: ADEQUATE
Special Requests: ADEQUATE

## 2020-03-08 ENCOUNTER — Other Ambulatory Visit: Payer: Self-pay

## 2020-03-08 ENCOUNTER — Telehealth (INDEPENDENT_AMBULATORY_CARE_PROVIDER_SITE_OTHER): Payer: Medicare Other | Admitting: Family Medicine

## 2020-03-08 ENCOUNTER — Encounter: Payer: Self-pay | Admitting: Family Medicine

## 2020-03-08 ENCOUNTER — Telehealth: Payer: Medicare Other

## 2020-03-08 DIAGNOSIS — J1282 Pneumonia due to coronavirus disease 2019: Secondary | ICD-10-CM | POA: Diagnosis not present

## 2020-03-08 DIAGNOSIS — N1832 Chronic kidney disease, stage 3b: Secondary | ICD-10-CM | POA: Diagnosis not present

## 2020-03-08 DIAGNOSIS — J439 Emphysema, unspecified: Secondary | ICD-10-CM | POA: Diagnosis not present

## 2020-03-08 DIAGNOSIS — U071 COVID-19: Secondary | ICD-10-CM

## 2020-03-08 DIAGNOSIS — J9600 Acute respiratory failure, unspecified whether with hypoxia or hypercapnia: Secondary | ICD-10-CM | POA: Diagnosis not present

## 2020-03-08 DIAGNOSIS — N179 Acute kidney failure, unspecified: Secondary | ICD-10-CM

## 2020-03-08 DIAGNOSIS — D696 Thrombocytopenia, unspecified: Secondary | ICD-10-CM

## 2020-03-08 NOTE — Chronic Care Management (AMB) (Unsigned)
Chronic Care Management Pharmacy  Name: Tyrone Roman  MRN: [id number] DOB: 07-May-1945   Initial Questions: 1. Have you seen any other providers since your last visit? NA 2. Any changes in your medicines or health? No   Chief Complaint/ HPI  Tyrone Roman,  75 y.o. , male presents for their Follow-Up CCM visit with the clinical pharmacist via telephone due to COVID-19 Pandemic.  PCP : [name], MD  Their chronic conditions include: HLD, CAD, RA, GERD/ Barretts esophagus, BPH  Office Visits: 03/09/19 [name], MD: Patient presented for a video visit for post hospital follow up. No medication changes made.  09/23/19 [name], LPN: Patient presented for Medicare annual wellness visit.  08/24/19 [name], MD: Patient presented with dysuria. His urinalysis was positive for protein and leukocytes. Restarted Cipro 500 mg BID x 10 days. Follow up if worsening symptoms.  05/11/2019- [name], MD- Patient presented for office visit for complaint of 3 weeks of urinary frequency and urgency. Patient has recurrent prostate infections. Patient to obtain fasting labs (lipids and A1c). Patient treated with Cipro for another prostatitis. Patient is past due for colonoscopy. Patient referred to dermatology for a skin check.   Consult Visit: 1/24-1/27 Patient was admitted to the hospital for [facility].  09/16/19 [name], MD (otolaryngology): Patient presented for hearing loss of both ears and tinnitus. Patient would be a candidate for hearing aids if he elects to use these as this can help tinnitus.   07/07/19 [name], MD (dermatology): Patient presented for skin biopsy of right inferior helix and right upper arm. Pathology results can back as precancerous.   03/18/2019- Sports Medicine- Clearance [name], MD- Patient had office visit for fracture of left hand. Initial injury on 1/21. Limited improvement and some blistering between fingers and palm. Patient counseled on  skin care and referred to orthopedics.   Medications: Outpatient Encounter Medications as of 03/08/2020  Medication Sig Note  . aspirin EC 81 MG tablet Take 1 tablet (81 mg total) by mouth daily.   . metoprolol succinate (TOPROL-XL) 25 MG 24 hr tablet Take 1 tablet (25 mg total) by mouth daily.   . nitroGLYCERIN (NITROSTAT) 0.4 MG SL tablet Place 1 tablet (0.4 mg total) under the tongue every 5 (five) minutes as needed for chest pain (3 doses max).   Marland Kitchen omeprazole (PRILOSEC) 40 MG capsule Take 1 capsule (40 mg total) by mouth daily.   . potassium chloride SA (KLOR-CON M20) 20 MEQ tablet Take 1 tablet (20 mEq total) by mouth daily. (Patient not taking: Reported on 03/08/2020)   . predniSONE (DELTASONE) 10 MG tablet Take 4 tablets (40 mg total) by mouth daily with breakfast for 7 days.   Derrill Memo ON 03/10/2020] predniSONE (DELTASONE) 5 MG tablet Take 1 tablet (5 mg total) by mouth daily.   . riTUXimab (RITUXAN) 500 MG/50ML injection Inject into the vein. Every 4 months   . sildenafil (REVATIO) 20 MG tablet TAKE ONE TABLET BY MOUTH DAILY AS NEEDED (Patient not taking: Reported on 03/08/2020)   . simvastatin (ZOCOR) 40 MG tablet TAKE ONE TABLET BY MOUTH DAILY 02/29/2020: [facility] at [facility] on 12/26/19. 90 Day Supply  . tamsulosin (FLOMAX) 0.4 MG CAPS capsule Take 2 capsules (0.8 mg total) by mouth daily.   Marland Kitchen zolpidem (AMBIEN) 10 MG tablet TAKE ONE TABLET BY MOUTH EVERY NIGHT AT BEDTIME    No facility-administered encounter medications on file as of 03/08/2020.     Current Diagnosis/Assessment:  Goals Addressed  None      Hyperlipidemia  LDL goal < 70  Lipid Panel     Component Value Date/Time   CHOL 158 05/11/2019 1334   TRIG 201 (H) 02/29/2020 1343   HDL 47.70 05/11/2019 1334   CHOLHDL 3 05/11/2019 1334   VLDL 27.8 05/11/2019 1334   LDLCALC 83 05/11/2019 1334    The ASCVD Risk score (Goff DC Jr., et al., 2013) failed to calculate for the following reasons:   The patient has a prior MI  or stroke diagnosis   Patient has failed these meds in past: none  Patient is currently controlled on the following medications:   Simvastatin 40mg , 1 tablet once daily   We discussed:  diet and exercise extensively -Diet: patient rarely eats canned or frozen foods; he enjoys stirfrying chicken and eats plenty of fiber Lowering cholesterol through diet by: Marland Kitchen Limiting foods with cholesterol such as liver and other organ meats, egg yolks, shrimp, and whole milk dairy products . Avoiding saturated fats and trans fats and incorporating healthier fats, such as lean meat, nuts, and unsaturated oils like canola and olive oils . Eating foods with soluble fiber such as whole-grain cereals such as oatmeal and oat bran, fruits such as apples, bananas, oranges, pears, and prunes, legumes such as kidney beans, lentils, chick peas, black-eyed peas, and lima beans, and green leafy vegetables . Limiting alcohol intake -Exercises: patient started waking 3 x a week, 3/4 to 1.5 miles over the past 6 months  Plan Continue current medications  CAD   Patient reported never needing to use nitroglycerin. Denies chest pain.   Patient has failed these meds in past: none Patient is currently controlled on the following medications:   Metoprolol succinate 25mg , 1 tablet once daily   Nitroglycerin 0.4mg  SL tablet, 1 tablet under tongue every five mintues as needed for chest pain (3 doses max)  We discussed:   diet: changes all the time. Tries to watch what he eats, keeping low carbs and protein -Diet: patient uses sea salt and does not use much  Plan Continue current medications  Denies dizziness/lightheadedness.  Rheumatoid arthritis    Patient is currently controlled on the following medications:   leflunomide (Arava) 20mg , 1 tablet once daily   Prednisone 5mg , 1 tablet daily   Rituxan infusion every 4 months (infusion 1 or 2 weeks later)   Plan Continue current medications   GERD/ Barrett's  esophagus   Patient is currently controlled on the following medications:   Omeprazole 40mg ,1 capsule once daily   Plan Continue current medications  BPH   Patient is currently controlled on the following medications:   tamsulosin 0.4mg , 2 capsules once daily   We discussed: patient reports he still has a weak urine flow   Plan Continue current medications  Insomnia   Patient has failed these meds in past: none Patient is currently controlled on the following medications:  Marland Kitchen Zolpidem 10 mg (1/2 tablet - 1 tablet) every night  We discussed:  Practicing good sleep hygiene by setting a sleep schedule and maintaining it, avoid excessive napping, following a nightly routine, avoiding screen time for 30-60 minutes before going to bed, and making the bedroom a cool, quiet and dark space  Plan  Continue current medications   OTC/ supplements     APAP 325mg , 2 tablets every six hours as needed for mild pain, fever or headache   Biotin 1mg   Cetrizine 5mg , 1 tablet as needed - seasonally    Medication  Management  Patient organizes medications: weekly pack.  Primary [facility]: [facility]   Adherence: [facility] confirmed fills not on dispense report - dispense report not accurate for [facility]   Follow up: Follow up visit with PharmD in 3 months for BPH and insomnia assessment    Jeni Salles, PharmD Clinical Pharmacist [place] [facility] [place] [phone]

## 2020-03-08 NOTE — Progress Notes (Signed)
   Subjective:    Patient ID: Tyrone Roman, male    DOB: 1945/08/04, 75 y.o.   MRN: [id number]  HPI Virtual Visit via Telephone Note  I connected with the patient on 03/08/20 at 11:00 AM EST by telephone and verified that I am speaking with the correct person using two identifiers.   I discussed the limitations, risks, security and privacy concerns of performing an evaluation and management service by telephone and the availability of in person appointments. I also discussed with the patient that there may be a patient responsible charge related to this service. The patient expressed understanding and agreed to proceed.  Location patient: home Location provider: work or home office Participants present for the call: patient, provider Patient did not have a visit in the prior 7 days to address this/these issue(s).   History of Present Illness: Here to follow up a hospital stay from 02-29-20 to 03-03-20 for Covid pneumonia. He presented with SOB, cough, and weakness. He was treated with Remdesivir and steroids. He was sent home off all supplemental oxygen. He also had an AKI with her creatinine going up to 2.02. This had come down to 1.42 by DC. In reviewing his labs for the past few years, he has had some mild CKD. Also his platelet count dropped to a low of 62 K in the hospital, and it came back up to 94 at DC. He has had mildly low platelet counts for the past year. WBC and Hgb are normal. He feels like he is slowly recovering. He is still a bit weak and SOB, but his strength is returning. His appetite is good. No other specific complaints today.    Observations/Objective: Patient sounds cheerful and well on the phone. I do not appreciate any SOB. Speech and thought processing are grossly intact. Patient reported vitals:  Assessment and Plan: He is recovering from a Covid-19 infection with pneumonia and AKI. He will continue his current medications. Refer to Hematology for  thrombocytopenia. Refer to Nephrology for CKD.  [name], MD   Follow Up Instructions:     [id number] 5-10 [phone] 11-20 [id number] 21-30 I did not refer this patient for an OV in the next 24 hours for this/these issue(s).  I discussed the assessment and treatment plan with the patient. The patient was provided an opportunity to ask questions and all were answered. The patient agreed with the plan and demonstrated an understanding of the instructions.   The patient was advised to call back or seek an in-person evaluation if the symptoms worsen or if the condition fails to improve as anticipated.  I provided 26 minutes of non-face-to-face time during this encounter.   [name], MD    Review of Systems     Objective:   Physical Exam        Assessment & Plan:

## 2020-03-15 ENCOUNTER — Telehealth: Payer: Self-pay | Admitting: Hematology and Oncology

## 2020-03-15 NOTE — Telephone Encounter (Signed)
Received a new hem referral from Dr. Sarajane Jews for thrombocytopenia. [name] returned my call and has been scheduled to see Dr. Chryl Heck on 2/23 at 10am. Pt aware to arrive 20 minutes early.

## 2020-03-16 ENCOUNTER — Emergency Department (HOSPITAL_COMMUNITY): Payer: Medicare Other

## 2020-03-16 ENCOUNTER — Telehealth: Payer: Self-pay

## 2020-03-16 ENCOUNTER — Encounter (HOSPITAL_COMMUNITY): Payer: Self-pay

## 2020-03-16 ENCOUNTER — Inpatient Hospital Stay (HOSPITAL_COMMUNITY)
Admission: EM | Admit: 2020-03-16 | Discharge: 2020-03-18 | DRG: 193 | Disposition: A | Discharge status: Home / Self Care | Payer: Medicare Other | Attending: Internal Medicine | Admitting: Internal Medicine

## 2020-03-16 DIAGNOSIS — U071 COVID-19: Secondary | ICD-10-CM

## 2020-03-16 DIAGNOSIS — Z881 Allergy status to other antibiotic agents status: Secondary | ICD-10-CM

## 2020-03-16 DIAGNOSIS — J441 Chronic obstructive pulmonary disease with (acute) exacerbation: Secondary | ICD-10-CM | POA: Diagnosis present

## 2020-03-16 DIAGNOSIS — U099 Post covid-19 condition, unspecified: Secondary | ICD-10-CM | POA: Diagnosis not present

## 2020-03-16 DIAGNOSIS — E2740 Unspecified adrenocortical insufficiency: Secondary | ICD-10-CM | POA: Diagnosis not present

## 2020-03-16 DIAGNOSIS — E785 Hyperlipidemia, unspecified: Secondary | ICD-10-CM | POA: Diagnosis present

## 2020-03-16 DIAGNOSIS — J9601 Acute respiratory failure with hypoxia: Secondary | ICD-10-CM | POA: Diagnosis not present

## 2020-03-16 DIAGNOSIS — Z79899 Other long term (current) drug therapy: Secondary | ICD-10-CM

## 2020-03-16 DIAGNOSIS — M069 Rheumatoid arthritis, unspecified: Secondary | ICD-10-CM | POA: Diagnosis present

## 2020-03-16 DIAGNOSIS — J1282 Pneumonia due to coronavirus disease 2019: Secondary | ICD-10-CM | POA: Diagnosis not present

## 2020-03-16 DIAGNOSIS — Z7982 Long term (current) use of aspirin: Secondary | ICD-10-CM

## 2020-03-16 DIAGNOSIS — Z8249 Family history of ischemic heart disease and other diseases of the circulatory system: Secondary | ICD-10-CM

## 2020-03-16 DIAGNOSIS — R000 Tachycardia, unspecified: Secondary | ICD-10-CM | POA: Diagnosis not present

## 2020-03-16 DIAGNOSIS — R0902 Hypoxemia: Secondary | ICD-10-CM

## 2020-03-16 DIAGNOSIS — R0602 Shortness of breath: Secondary | ICD-10-CM | POA: Diagnosis not present

## 2020-03-16 DIAGNOSIS — Z955 Presence of coronary angioplasty implant and graft: Secondary | ICD-10-CM

## 2020-03-16 DIAGNOSIS — N1832 Chronic kidney disease, stage 3b: Secondary | ICD-10-CM | POA: Diagnosis present

## 2020-03-16 DIAGNOSIS — I252 Old myocardial infarction: Secondary | ICD-10-CM

## 2020-03-16 DIAGNOSIS — N400 Enlarged prostate without lower urinary tract symptoms: Secondary | ICD-10-CM | POA: Diagnosis present

## 2020-03-16 DIAGNOSIS — J189 Pneumonia, unspecified organism: Principal | ICD-10-CM | POA: Diagnosis present

## 2020-03-16 DIAGNOSIS — Z8261 Family history of arthritis: Secondary | ICD-10-CM

## 2020-03-16 DIAGNOSIS — R1310 Dysphagia, unspecified: Secondary | ICD-10-CM | POA: Diagnosis present

## 2020-03-16 DIAGNOSIS — J440 Chronic obstructive pulmonary disease with acute lower respiratory infection: Secondary | ICD-10-CM | POA: Diagnosis not present

## 2020-03-16 DIAGNOSIS — Z7952 Long term (current) use of systemic steroids: Secondary | ICD-10-CM

## 2020-03-16 DIAGNOSIS — I129 Hypertensive chronic kidney disease with stage 1 through stage 4 chronic kidney disease, or unspecified chronic kidney disease: Secondary | ICD-10-CM | POA: Diagnosis present

## 2020-03-16 DIAGNOSIS — J188 Other pneumonia, unspecified organism: Secondary | ICD-10-CM

## 2020-03-16 DIAGNOSIS — Z888 Allergy status to other drugs, medicaments and biological substances status: Secondary | ICD-10-CM

## 2020-03-16 DIAGNOSIS — I2510 Atherosclerotic heart disease of native coronary artery without angina pectoris: Secondary | ICD-10-CM

## 2020-03-16 DIAGNOSIS — R0789 Other chest pain: Secondary | ICD-10-CM | POA: Diagnosis not present

## 2020-03-16 DIAGNOSIS — R42 Dizziness and giddiness: Secondary | ICD-10-CM | POA: Diagnosis not present

## 2020-03-16 DIAGNOSIS — Z87891 Personal history of nicotine dependence: Secondary | ICD-10-CM

## 2020-03-16 DIAGNOSIS — R079 Chest pain, unspecified: Secondary | ICD-10-CM | POA: Diagnosis not present

## 2020-03-16 LAB — CBC
HCT: 45.7 % (ref 39.0–52.0)
Hemoglobin: 15 g/dL (ref 13.0–17.0)
MCH: 29.4 pg (ref 26.0–34.0)
MCHC: 32.8 g/dL (ref 30.0–36.0)
MCV: 89.4 fL (ref 80.0–100.0)
Platelets: 107 10*3/uL — ABNORMAL LOW (ref 150–400)
RBC: 5.11 MIL/uL (ref 4.22–5.81)
RDW: 14 % (ref 11.5–15.5)
WBC: 7.1 10*3/uL (ref 4.0–10.5)
nRBC: 0 % (ref 0.0–0.2)

## 2020-03-16 LAB — BASIC METABOLIC PANEL
Anion gap: 11 (ref 5–15)
BUN: 19 mg/dL (ref 8–23)
CO2: 22 mmol/L (ref 22–32)
Calcium: 8.4 mg/dL — ABNORMAL LOW (ref 8.9–10.3)
Chloride: 102 mmol/L (ref 98–111)
Creatinine, Ser: 1.43 mg/dL — ABNORMAL HIGH (ref 0.61–1.24)
GFR, Estimated: 51 mL/min — ABNORMAL LOW (ref >60)
Glucose, Bld: 123 mg/dL — ABNORMAL HIGH (ref 70–99)
Potassium: 4.4 mmol/L (ref 3.5–5.1)
Sodium: 135 mmol/L (ref 135–145)

## 2020-03-16 LAB — BRAIN NATRIURETIC PEPTIDE: B Natriuretic Peptide: 39.6 pg/mL (ref 0.0–100.0)

## 2020-03-16 LAB — TROPONIN I (HIGH SENSITIVITY)
Troponin I (High Sensitivity): 32 ng/L — ABNORMAL HIGH (ref <18)
Troponin I (High Sensitivity): 38 ng/L — ABNORMAL HIGH (ref <18)

## 2020-03-16 LAB — LACTIC ACID, PLASMA: Lactic Acid, Venous: 1.1 mmol/L (ref 0.5–1.9)

## 2020-03-16 MED ORDER — IOHEXOL 300 MG/ML  SOLN
60.0000 mL | Freq: Once | INTRAMUSCULAR | Status: AC | PRN
  Administered 2020-03-16: 60 mL via INTRAVENOUS

## 2020-03-16 MED ORDER — FENTANYL CITRATE (PF) 100 MCG/2ML IJ SOLN
50.0000 ug | Freq: Once | INTRAMUSCULAR | Status: AC
  Administered 2020-03-16: 50 ug via INTRAVENOUS
  Filled 2020-03-16: qty 2

## 2020-03-16 NOTE — ED Notes (Signed)
Disregard IV note at 00:00, error in charting, pt has IV in place from EMS, 20G to [place] that was placed around 1145

## 2020-03-16 NOTE — ED Triage Notes (Signed)
Pt from home with ems, recently discharged for covid, c.o chest pain and dizziness. Pt a.o, 96% on room air

## 2020-03-16 NOTE — Telephone Encounter (Signed)
Pt has called in regards to his visit to the ED, pt states he has been there for over 6 hrs, he has had Xray, labs and vitals done. Pt is unhappy with wait time, pt states he will call back if no results in 15 mins

## 2020-03-16 NOTE — ED Provider Notes (Signed)
[facility] EMERGENCY DEPARTMENT Provider Note   CSN: [id number] Arrival date & time: 03/16/20  1255     History Chief Complaint  Patient presents with  . Covid Positive  . Dizziness  . Chest Pain    Tyrone Roman is a 75 y.o. male.  Presents to ER with concern for chest pain, shortness of breath.  Felt like he had been more covering from his recent Covid illness however today he started having episodes of chest discomfort, lightheadedness and feeling short of breath.  Chest discomfort described as a pressure, mild to moderate in severity, occurring at rest and with exertion.  Nonradiating.  Has cough but no fever.  No productive.  Additional history from chart review, admitted on 1/24 for COVID-19 pneumonia.  Discharged 1/27.  Symptoms started 12 days prior to initial admission.  HPI     Past Medical History:  Diagnosis Date  . Allergy   . Barrett's esophageal ulceration   . BPH (benign prostatic hyperplasia)   . CAD (coronary artery disease)    sees Dr. Mar Daring  . Cataract    both eyes removed   . Colonic polyp   . Concussion with loss of consciousness of 30 minutes or less   . Contact dermatitis   . Contact with or exposure to venereal diseases   . COPD (chronic obstructive pulmonary disease) ([place])    sees Dr. Kara Mead   . Diverticulosis of colon   . Dysphagia   . GERD (gastroesophageal reflux disease)    past hx- on meds   . HNP (herniated nucleus pulposus), lumbar    recurrent  . Hyperlipidemia   . Hypertension   . Laceration of scalp   . Myocardial infarction [facility])    2011- stent placed  . Nephrolithiasis   . Pre-operative cardiovascular examination   . Rheumatoid arthritis [facility])    sees Dr. Gavin Pound   . SOB (shortness of breath)     Patient Active Problem List   Diagnosis Date Noted  . Thrombocytopenia ([place]) 03/08/2020  . Stage 3b chronic kidney disease ([place]) 03/08/2020  . Pneumonia due to COVID-19 virus 02/29/2020  .  Acute respiratory failure due to COVID-19 ([place]) 02/29/2020  . Closed nondisplaced fracture of shaft of third metacarpal bone of left hand 03/04/2019  . Wound infection after surgery 01/01/2018  . Medication monitoring encounter 01/01/2018  . AKI (acute kidney injury) ([place]) 12/10/2017  . HNP (herniated nucleus pulposus), lumbar 10/07/2017  . Lumbar herniated disc 10/07/2017  . Psoriasis of scalp 03/27/2016  . Rheumatoid arthritis ([place]) 03/25/2013  . GI bleeding 02/20/2012  . Dyspnea on exertion 09/08/2010  . COPD (chronic obstructive pulmonary disease) ([place]) 08/04/2010  . Bruising 08/04/2010  . TINNITUS 12/16/2009  . Dizziness and giddiness 12/16/2009  . NECK SPRAIN AND STRAIN 10/21/2009  . LUMBAR SPRAIN AND STRAIN 10/21/2009  . Hyperlipidemia 06/10/2009  . Coronary artery disease of native artery of native heart with stable angina pectoris ([place]) 06/10/2009  . GERD 06/10/2009  . BARRETTS ESOPHAGUS 06/10/2009  . CONCUSSION WITH LOC OF 30 MINUTES OR LESS 02/28/2009  . NEPHROLITHIASIS 07/01/2007  . CONTACT DERMATITIS 12/12/2006  . BPH with urinary obstruction 11/04/2006  . COLONIC POLYPS 10/21/2003  . DIVERTICULOSIS, COLON 10/21/2003    Past Surgical History:  Procedure Laterality Date  . BACK SURGERY    . CARDIAC CATHETERIZATION N/A 01/19/2016   Procedure: Left Heart Cath and Coronary Angiography;  Surgeon: Burnell Blanks, MD;  Location: [facility] CV  LAB;  Service: Cardiovascular;  Laterality: N/A;  . COLONOSCOPY  12/09/2014   per Dr. Silverio Decamp, adenomatous polyps, repeat 3 years   . CORONARY ANGIOPLASTY WITH STENT PLACEMENT    . ESOPHAGOGASTRODUODENOSCOPY (EGD) WITH ESOPHAGEAL DILATION  02/17/09   Barretts esophagus   . EYE SURGERY    . KNEE ARTHROSCOPY Left X 2  . LUMBAR LAMINECTOMY/DECOMPRESSION MICRODISCECTOMY Right 10/07/2017   Procedure: RIGHT LUMBAR ONE-TWO LAMINECTOMY WITH MICRODISCECTOMY;  Surgeon: Consuella Lose, MD;  Location: [facility];  Service: Neurosurgery;   Laterality: Right;  . LUMBAR LAMINECTOMY/DECOMPRESSION MICRODISCECTOMY N/A 12/06/2017   Procedure: RECURRENT MICRODISCECTOMY LUMBAR ONE - LUMBAR TWO;  Surgeon: Consuella Lose, MD;  Location: [facility];  Service: Neurosurgery;  Laterality: N/A;  . LUMBAR WOUND DEBRIDEMENT N/A 12/11/2017   Procedure: LUMBAR WOUND DEBRIDEMENT;  Surgeon: Consuella Lose, MD;  Location: [facility];  Service: Neurosurgery;  Laterality: N/A;  . POLYPECTOMY    . TONSILLECTOMY    . UPPER GASTROINTESTINAL ENDOSCOPY         Family History  Problem Relation Age of Onset  . Heart attack Father        cardiovascular disorder  . Arthritis Father        family hx  . Colon cancer Father        mets  . Prostate cancer Father        1st degree relative  . Arthritis Mother   . Colon polyps Mother   . Melanoma Mother   . Dementia Mother   . Diabetes Paternal Uncle   . Colon cancer Paternal Uncle   . Stomach cancer Paternal Uncle   . Melanoma Paternal Uncle   . Cancer Maternal Grandmother   . Skin cancer Daughter   . Colon polyps Sister   . Esophageal cancer Neg Hx   . Rectal cancer Neg Hx     Social History   Tobacco Use  . Smoking status: Former Smoker    Packs/day: 1.00    Years: 20.00    Pack years: 20.00    Types: Cigarettes    Quit date: 10/12/1988    Years since quitting: 31.4  . Smokeless tobacco: Never Used  . Tobacco comment: 1960's   Vaping Use  . Vaping Use: Never used  Substance Use Topics  . Alcohol use: Yes    Alcohol/week: 0.0 standard drinks    Comment: rare  . Drug use: No    Home Medications Prior to Admission medications   Medication Sig Start Date End Date Taking? Authorizing Provider  aspirin EC 81 MG tablet Take 1 tablet (81 mg total) by mouth daily. 12/11/17   Costella, Vista Mink, PA-C  metoprolol succinate (TOPROL-XL) 25 MG 24 hr tablet Take 1 tablet (25 mg total) by mouth daily. 05/11/19   [name], MD  nitroGLYCERIN (NITROSTAT) 0.4 MG SL tablet Place 1 tablet (0.4 mg  total) under the tongue every 5 (five) minutes as needed for chest pain (3 doses max). 12/20/17   [name], MD  omeprazole (PRILOSEC) 40 MG capsule Take 1 capsule (40 mg total) by mouth daily. 05/11/19   [name], MD  potassium chloride SA (KLOR-CON M20) 20 MEQ tablet Take 1 tablet (20 mEq total) by mouth daily. Patient not taking: Reported on 03/08/2020 05/11/19   [name], MD  predniSONE (DELTASONE) 5 MG tablet Take 1 tablet (5 mg total) by mouth daily. 03/10/20   Pokhrel, Corrie Mckusick, MD  riTUXimab (RITUXAN) 500 MG/50ML injection Inject into the vein. Every  4 months    [provider]  sildenafil (REVATIO) 20 MG tablet TAKE ONE TABLET BY MOUTH DAILY AS NEEDED Patient not taking: Reported on 03/08/2020 12/18/19   [name], MD  simvastatin (ZOCOR) 40 MG tablet TAKE ONE TABLET BY MOUTH DAILY 09/07/19   [name], MD  tamsulosin (FLOMAX) 0.4 MG CAPS capsule Take 2 capsules (0.8 mg total) by mouth daily. 12/07/19   [name], MD  zolpidem (AMBIEN) 10 MG tablet TAKE ONE TABLET BY MOUTH EVERY NIGHT AT BEDTIME 12/18/19   [name], MD    Allergies    Cephalexin, Clarithromycin, Certolizumab pegol, Tocilizumab, Doxycycline, Hydroxyzine, Lidoderm, and Pyrithione zinc  Review of Systems   Review of Systems  Constitutional: Positive for chills. Negative for fever.  HENT: Negative for ear pain and sore throat.   Eyes: Negative for pain and visual disturbance.  Respiratory: Positive for cough and shortness of breath.   Cardiovascular: Positive for chest pain. Negative for palpitations.  Gastrointestinal: Negative for abdominal pain and vomiting.  Genitourinary: Negative for dysuria and hematuria.  Musculoskeletal: Negative for arthralgias and back pain.  Skin: Negative for color change and rash.  Neurological: Negative for seizures and syncope.  All other systems reviewed and are negative.   Physical Exam Updated Vital Signs BP 122/79   Pulse (!) 130   Temp 99.6  F (37.6 C) (Oral)   Resp (!) 25   SpO2 (!) 85%   Physical Exam Vitals and nursing note reviewed.  Constitutional:      Appearance: He is well-developed and well-nourished.  HENT:     Head: Normocephalic and atraumatic.  Eyes:     Conjunctiva/sclera: Conjunctivae normal.  Cardiovascular:     Rate and Rhythm: Normal rate and regular rhythm.     Heart sounds: No murmur heard.   Pulmonary:     Effort: Pulmonary effort is normal. No respiratory distress.     Breath sounds: Normal breath sounds.  Abdominal:     Palpations: Abdomen is soft.     Tenderness: There is no abdominal tenderness.  Musculoskeletal:        General: No edema.     Cervical back: Neck supple.  Skin:    General: Skin is warm and dry.     Capillary Refill: Capillary refill takes less than 2 seconds.  Neurological:     General: No focal deficit present.     Mental Status: He is alert.  Psychiatric:        Mood and Affect: Mood and affect normal.     ED Results / Procedures / Treatments   Labs (all labs ordered are listed, but only abnormal results are displayed) Labs Reviewed  BASIC METABOLIC PANEL - Abnormal; Notable for the following components:      Result Value   Glucose, Bld 123 (*)    Creatinine, Ser 1.43 (*)    Calcium 8.4 (*)    GFR, Estimated 51 (*)    All other components within normal limits  CBC - Abnormal; Notable for the following components:   Platelets 107 (*)    All other components within normal limits  TROPONIN I (HIGH SENSITIVITY) - Abnormal; Notable for the following components:   Troponin I (High Sensitivity) 32 (*)    All other components within normal limits  TROPONIN I (HIGH SENSITIVITY) - Abnormal; Notable for the following components:   Troponin I (High Sensitivity) 38 (*)    All other components within normal limits  CULTURE,  BLOOD (ROUTINE X 2)  CULTURE, BLOOD (ROUTINE X 2)  BRAIN NATRIURETIC PEPTIDE  LACTIC ACID, PLASMA  PROCALCITONIN  PROCALCITONIN     EKG EKG Interpretation  Date/Time:  Wednesday March 16 2020 13:11:40 EST Ventricular Rate:  118 PR Interval:  132 QRS Duration: 94 QT Interval:  318 QTC Calculation: 445 R Axis:   -49 Text Interpretation: Sinus tachycardia Left anterior fascicular block Moderate voltage criteria for LVH, may be normal variant ( R in aVL , Cornell product ) Abnormal ECG When compared to prior, faster rate. No STEMI Confirmed by Antony Blackbird [phone]) on 03/16/2020 5:03:58 PM   Radiology DG Chest 2 View  Result Date: 03/16/2020 CLINICAL DATA:  Chest pain, COVID-19 positive. EXAM: CHEST - 2 VIEW COMPARISON:  February 29, 2020. FINDINGS: The heart size and mediastinal contours are within normal limits. No pneumothorax or pleural effusion is noted. Multiple patchy airspace opacities are noted bilaterally consistent with multifocal pneumonia. The visualized skeletal structures are unremarkable. IMPRESSION: Bilateral multifocal pneumonia. Electronically Signed   By: Marijo Conception M.D.   On: 03/16/2020 13:58   CT Angio Chest PE W and/or Wo Contrast  Result Date: 03/16/2020 CLINICAL DATA:  COVID, chest pain.  Concern for PE. EXAM: CT ANGIOGRAPHY CHEST WITH CONTRAST TECHNIQUE: Multidetector CT imaging of the chest was performed using the standard protocol during bolus administration of intravenous contrast. Multiplanar CT image reconstructions and MIPs were obtained to evaluate the vascular anatomy. CONTRAST:  79mL OMNIPAQUE IOHEXOL 300 MG/ML  SOLN COMPARISON:  11/27/2010 FINDINGS: Cardiovascular: No filling defects in the pulmonary arteries to suggest pulmonary emboli. Heart is normal size. Aorta is normal caliber. Coronary artery and aortic calcifications. Mediastinum/Nodes: No mediastinal, hilar, or axillary adenopathy. Trachea and esophagus are unremarkable. Thyroid unremarkable. Lungs/Pleura: Patchy peripheral bilateral airspace opacities, favor related to COVID pneumonia. Mild centrilobular emphysema. No  effusions. Upper Abdomen: Insert for abdomen Musculoskeletal: Chest wall soft tissues are unremarkable. No acute bony abnormality. Review of the MIP images confirms the above findings. IMPRESSION: No evidence of pulmonary embolus. Patchy bilateral peripheral airspace opacities most compatible with COVID pneumonia. Coronary artery disease. Aortic Atherosclerosis (ICD10-I70.0) and Emphysema (ICD10-J43.9). Electronically Signed   By: Rolm Baptise M.D.   On: 03/16/2020 20:49    Procedures .Critical Care Performed by: Lucrezia Starch, MD Authorized by: Lucrezia Starch, MD   Critical care provider statement:    Critical care time (minutes):  45   Critical care was necessary to treat or prevent imminent or life-threatening deterioration of the following conditions:  Respiratory failure   Critical care was time spent personally by me on the following activities:  Discussions with consultants, evaluation of patient's response to treatment, examination of patient, ordering and performing treatments and interventions, ordering and review of laboratory studies, ordering and review of radiographic studies, pulse oximetry, re-evaluation of patient's condition, obtaining history from patient or surrogate and review of old charts     Medications Ordered in ED Medications  Ampicillin-Sulbactam (UNASYN) 3 g in sodium chloride 0.9 % 100 mL IVPB (has no administration in time range)  iohexol (OMNIPAQUE) 300 MG/ML solution 60 mL (60 mLs Intravenous Contrast Given 03/16/20 2044)  fentaNYL (SUBLIMAZE) injection 50 mcg (50 mcg Intravenous Given 03/16/20 2212)    ED Course  I have reviewed the triage vital signs and the nursing notes.  Pertinent labs & imaging results that were available during my care of the patient were reviewed by me and considered in my medical decision making (see chart for details).  MDM Rules/Calculators/A&P                         75 year old male presenting to ER with concern for  chest pain, shortness of breath in setting of recent COVID-19 infection.  On exam, patient not in distress but noted to be mildly tachypneic and mild tachycardic and borderline O2 saturations.  Did become hypoxic with minimal ambulation.  CTA chest was negative for PE but demonstrated multifocal pneumonia, likely Covid pneumonia.  EKG did not have obvious ischemic changes but initial troponin was mildly elevated, repeat troponin without significant delta.  Lower suspicion for ACS at this time.  Suspect symptoms related to his Covid pneumonia.  Reviewed with hospitalist, given patient has now had symptoms for a few weeks, concern for superimposed bacterial process.  Will start empiric antibiotics for now, they will follow-up procalcitonin levels and cultures.  On 2L [place] currently. Admit to [facility] with TRH.   Tyrone Roman was evaluated in Emergency Department on 03/17/2020 for the symptoms described in the history of present illness. He was evaluated in the context of the global COVID-19 pandemic, which necessitated consideration that the patient might be at risk for infection with the SARS-CoV-2 virus that causes COVID-19. Institutional protocols and algorithms that pertain to the evaluation of patients at risk for COVID-19 are in a state of rapid change based on information released by regulatory bodies including the CDC and federal and state organizations. These policies and algorithms were followed during the patient's care in the ED.  Final Clinical Impression(s) / ED Diagnoses Final diagnoses:  Acute respiratory failure with hypoxia ([place])  COVID-19    Rx / DC Orders ED Discharge Orders    None       Lucrezia Starch, MD 03/17/20 0025

## 2020-03-17 ENCOUNTER — Encounter (HOSPITAL_COMMUNITY): Payer: Self-pay | Admitting: Family Medicine

## 2020-03-17 DIAGNOSIS — J189 Pneumonia, unspecified organism: Secondary | ICD-10-CM

## 2020-03-17 DIAGNOSIS — R1310 Dysphagia, unspecified: Secondary | ICD-10-CM | POA: Diagnosis present

## 2020-03-17 DIAGNOSIS — Z888 Allergy status to other drugs, medicaments and biological substances status: Secondary | ICD-10-CM | POA: Diagnosis not present

## 2020-03-17 DIAGNOSIS — Z7982 Long term (current) use of aspirin: Secondary | ICD-10-CM | POA: Diagnosis not present

## 2020-03-17 DIAGNOSIS — I129 Hypertensive chronic kidney disease with stage 1 through stage 4 chronic kidney disease, or unspecified chronic kidney disease: Secondary | ICD-10-CM | POA: Diagnosis present

## 2020-03-17 DIAGNOSIS — I252 Old myocardial infarction: Secondary | ICD-10-CM | POA: Diagnosis not present

## 2020-03-17 DIAGNOSIS — N1832 Chronic kidney disease, stage 3b: Secondary | ICD-10-CM | POA: Diagnosis present

## 2020-03-17 DIAGNOSIS — Z7952 Long term (current) use of systemic steroids: Secondary | ICD-10-CM | POA: Diagnosis not present

## 2020-03-17 DIAGNOSIS — R0902 Hypoxemia: Secondary | ICD-10-CM

## 2020-03-17 DIAGNOSIS — J440 Chronic obstructive pulmonary disease with acute lower respiratory infection: Secondary | ICD-10-CM | POA: Diagnosis present

## 2020-03-17 DIAGNOSIS — E2740 Unspecified adrenocortical insufficiency: Secondary | ICD-10-CM | POA: Diagnosis present

## 2020-03-17 DIAGNOSIS — M069 Rheumatoid arthritis, unspecified: Secondary | ICD-10-CM | POA: Diagnosis present

## 2020-03-17 DIAGNOSIS — Z79899 Other long term (current) drug therapy: Secondary | ICD-10-CM | POA: Diagnosis not present

## 2020-03-17 DIAGNOSIS — Z8261 Family history of arthritis: Secondary | ICD-10-CM | POA: Diagnosis not present

## 2020-03-17 DIAGNOSIS — Z955 Presence of coronary angioplasty implant and graft: Secondary | ICD-10-CM | POA: Diagnosis not present

## 2020-03-17 DIAGNOSIS — J441 Chronic obstructive pulmonary disease with (acute) exacerbation: Secondary | ICD-10-CM | POA: Insufficient documentation

## 2020-03-17 DIAGNOSIS — N400 Enlarged prostate without lower urinary tract symptoms: Secondary | ICD-10-CM | POA: Diagnosis present

## 2020-03-17 DIAGNOSIS — I2510 Atherosclerotic heart disease of native coronary artery without angina pectoris: Secondary | ICD-10-CM

## 2020-03-17 DIAGNOSIS — U099 Post covid-19 condition, unspecified: Secondary | ICD-10-CM | POA: Diagnosis present

## 2020-03-17 DIAGNOSIS — J9601 Acute respiratory failure with hypoxia: Secondary | ICD-10-CM | POA: Diagnosis present

## 2020-03-17 DIAGNOSIS — Z8249 Family history of ischemic heart disease and other diseases of the circulatory system: Secondary | ICD-10-CM | POA: Diagnosis not present

## 2020-03-17 DIAGNOSIS — E785 Hyperlipidemia, unspecified: Secondary | ICD-10-CM | POA: Diagnosis present

## 2020-03-17 DIAGNOSIS — Z87891 Personal history of nicotine dependence: Secondary | ICD-10-CM | POA: Diagnosis not present

## 2020-03-17 DIAGNOSIS — Z881 Allergy status to other antibiotic agents status: Secondary | ICD-10-CM | POA: Diagnosis not present

## 2020-03-17 LAB — BASIC METABOLIC PANEL
Anion gap: 10 (ref 5–15)
BUN: 18 mg/dL (ref 8–23)
CO2: 21 mmol/L — ABNORMAL LOW (ref 22–32)
Calcium: 7.8 mg/dL — ABNORMAL LOW (ref 8.9–10.3)
Chloride: 100 mmol/L (ref 98–111)
Creatinine, Ser: 1.31 mg/dL — ABNORMAL HIGH (ref 0.61–1.24)
GFR, Estimated: 57 mL/min — ABNORMAL LOW (ref >60)
Glucose, Bld: 100 mg/dL — ABNORMAL HIGH (ref 70–99)
Potassium: 3.8 mmol/L (ref 3.5–5.1)
Sodium: 131 mmol/L — ABNORMAL LOW (ref 135–145)

## 2020-03-17 LAB — CBC
HCT: 37.7 % — ABNORMAL LOW (ref 39.0–52.0)
Hemoglobin: 12.9 g/dL — ABNORMAL LOW (ref 13.0–17.0)
MCH: 30.1 pg (ref 26.0–34.0)
MCHC: 34.2 g/dL (ref 30.0–36.0)
MCV: 88.1 fL (ref 80.0–100.0)
Platelets: 73 10*3/uL — ABNORMAL LOW (ref 150–400)
RBC: 4.28 MIL/uL (ref 4.22–5.81)
RDW: 14.3 % (ref 11.5–15.5)
WBC: 5.1 10*3/uL (ref 4.0–10.5)
nRBC: 0 % (ref 0.0–0.2)

## 2020-03-17 LAB — PROCALCITONIN
Procalcitonin: <0.1 ng/mL
Procalcitonin: <0.1 ng/mL

## 2020-03-17 LAB — TROPONIN I (HIGH SENSITIVITY): Troponin I (High Sensitivity): 36 ng/L — ABNORMAL HIGH (ref <18)

## 2020-03-17 MED ORDER — SODIUM CHLORIDE 0.9 % IV SOLN
3.0000 g | Freq: Three times a day (TID) | INTRAVENOUS | Status: DC
  Administered 2020-03-17 – 2020-03-18 (×4): 3 g via INTRAVENOUS
  Filled 2020-03-17 (×5): qty 8
  Filled 2020-03-17: qty 3

## 2020-03-17 MED ORDER — IPRATROPIUM-ALBUTEROL 20-100 MCG/ACT IN AERS
1.0000 | INHALATION_SPRAY | Freq: Four times a day (QID) | RESPIRATORY_TRACT | Status: DC
  Administered 2020-03-17 – 2020-03-18 (×6): 1 via RESPIRATORY_TRACT
  Filled 2020-03-17: qty 4

## 2020-03-17 MED ORDER — TAMSULOSIN HCL 0.4 MG PO CAPS
0.8000 mg | ORAL_CAPSULE | Freq: Every day | ORAL | Status: DC
  Administered 2020-03-17 – 2020-03-18 (×2): 0.8 mg via ORAL
  Filled 2020-03-17 (×2): qty 2

## 2020-03-17 MED ORDER — ZOLPIDEM TARTRATE 5 MG PO TABS
5.0000 mg | ORAL_TABLET | Freq: Every day | ORAL | Status: DC
  Administered 2020-03-17 (×2): 5 mg via ORAL
  Filled 2020-03-17 (×2): qty 1

## 2020-03-17 MED ORDER — LACTATED RINGERS IV SOLN: INTRAVENOUS | Status: DC

## 2020-03-17 MED ORDER — ASPIRIN EC 81 MG PO TBEC
81.0000 mg | DELAYED_RELEASE_TABLET | Freq: Every day | ORAL | Status: DC
  Administered 2020-03-17 – 2020-03-18 (×2): 81 mg via ORAL
  Filled 2020-03-17 (×2): qty 1

## 2020-03-17 MED ORDER — METHYLPREDNISOLONE SODIUM SUCC 125 MG IJ SOLR
125.0000 mg | Freq: Three times a day (TID) | INTRAMUSCULAR | Status: AC
  Administered 2020-03-17 (×3): 125 mg via INTRAVENOUS
  Filled 2020-03-17 (×3): qty 2

## 2020-03-17 MED ORDER — SIMVASTATIN 20 MG PO TABS
40.0000 mg | ORAL_TABLET | Freq: Every day | ORAL | Status: DC
  Administered 2020-03-17 – 2020-03-18 (×2): 40 mg via ORAL
  Filled 2020-03-17 (×2): qty 2

## 2020-03-17 MED ORDER — SODIUM CHLORIDE 0.9 % IV SOLN: 1.5000 g | Freq: Four times a day (QID) | INTRAVENOUS | Status: DC

## 2020-03-17 MED ORDER — NITROGLYCERIN 0.4 MG SL SUBL: 0.4000 mg | SUBLINGUAL_TABLET | SUBLINGUAL | Status: DC | PRN

## 2020-03-17 MED ORDER — ENOXAPARIN SODIUM 40 MG/0.4ML ~~LOC~~ SOLN
40.0000 mg | Freq: Every day | SUBCUTANEOUS | Status: DC
  Administered 2020-03-17 – 2020-03-18 (×2): 40 mg via SUBCUTANEOUS
  Filled 2020-03-17 (×2): qty 0.4

## 2020-03-17 MED ORDER — METOPROLOL SUCCINATE ER 25 MG PO TB24
25.0000 mg | ORAL_TABLET | Freq: Every day | ORAL | Status: DC
  Administered 2020-03-17 – 2020-03-18 (×2): 25 mg via ORAL
  Filled 2020-03-17 (×2): qty 1

## 2020-03-17 MED ORDER — ZOLPIDEM TARTRATE 5 MG PO TABS: 10.0000 mg | ORAL_TABLET | Freq: Every day | ORAL | Status: DC

## 2020-03-17 NOTE — ED Notes (Signed)
Breakfast Ordered 

## 2020-03-17 NOTE — ED Notes (Signed)
Pt states he feels lightheaded upon sitting up. Pt advised to sit and dangle for a moment before doing standing orthostatics. Standing orthostatics, tech notes significant wobbling back and forth. Pt labor of breathing also increases with each BP check.

## 2020-03-17 NOTE — ED Notes (Signed)
During ambulation trial, pt O2 saturation dropped to 85% and pt became dizzy after standing. MD made aware. Pt placed on 2L [place].

## 2020-03-17 NOTE — Progress Notes (Signed)
Patient admitted after midnight, please see H&P.  Here with worsening SOB post COVID.  CT Scan shows ? PNA so patient started on IV Abx.  Procalcitonin low so low threshold to d/c abx.  IV Steriod trial.  Check echo.  Was d/c'd about 2 weeks ago and per documentation did not need O2 at rest or with ambulation.  CTA negative for PE. Eulogio Bear DO

## 2020-03-17 NOTE — H&P (Signed)
History and Physical    Tyrone Roman QQV:[id number] DOB: 11-11-1945 DOA: 03/16/2020  PCP: [name], MD   Patient coming from: Home  Chief Complaint: SOB, cough  HPI: Tyrone Roman is a 75 y.o. male with medical history significant for CAD, COPD, BPH, RA, HTN, HLD who presents with complaint of chest pain and shortness of breath. Has had substernal chest pain that does not radiate for past two days. He has had SOB and cough for the past month. He was admitted for Covid-19 pneumonia on 02/29/20 and treated with remdisivir at that time. He had symptoms for 2 weeks prior to his admission.  Felt like he had been recovering from his recent Covid illness however today he started having episodes of chest discomfort, lightheadedness and feeling short of breath.  Chest discomfort described as a pressure, mild to moderate in severity, occurring at rest and with exertion. Has a dry cough which is unchanged over the past few weeks. He reports having chills but no fever. He denies having nausea or vomiting. He states his taste has been altered for the past 3-4 weeks. Denies swelling of legs or pain in legs. He has RA and has been compliant with his medications.   Review of Systems:  General: Reports having chills past few days. Denies weakness, weight loss, night sweats.  Denies dizziness.  Denies change in appetite HENT: Denies head trauma, headache, denies change in hearing, tinnitus.  Denies nasal congestion or bleeding.  Denies sore throat, sores in mouth.  Denies difficulty swallowing Eyes: Denies blurry vision, pain in eye, drainage.  Denies discoloration of eyes. Neck: Denies pain.  Denies swelling.  Denies pain with movement. Cardiovascular: Reports substernal chest pain. Denies palpitations.  Denies edema.  Denies orthopnea Respiratory: fReports shortness of breath with nonproductive cough.  Denies wheezing.  Denies sputum production Gastrointestinal: Denies abdominal pain, swelling.  Denies  nausea, vomiting, diarrhea.  Denies melena.  Denies hematemesis. Musculoskeletal: Denies limitation of movement.  Denies deformity or swelling.  Denies pain.  Denies arthralgias or myalgias. Genitourinary: Denies pelvic pain.  Denies urinary frequency or hesitancy.  Denies dysuria.  Skin: Denies rash.  Denies petechiae, purpura, ecchymosis. Neurological: Denies headache. Denies syncope Denies seizure activity. Denies weakness or paresthesia. Denies slurred speech, drooping face.  Denies visual change. Psychiatric: Denies depression, anxiety. Denies hallucinations.  Past Medical History:  Diagnosis Date  . Allergy   . Barrett's esophageal ulceration   . BPH (benign prostatic hyperplasia)   . CAD (coronary artery disease)    sees Dr. Mar Daring  . Cataract    both eyes removed   . Colonic polyp   . Concussion with loss of consciousness of 30 minutes or less   . Contact dermatitis   . Contact with or exposure to venereal diseases   . COPD (chronic obstructive pulmonary disease) ([place])    sees Dr. Kara Mead   . Diverticulosis of colon   . Dysphagia   . GERD (gastroesophageal reflux disease)    past hx- on meds   . HNP (herniated nucleus pulposus), lumbar    recurrent  . Hyperlipidemia   . Hypertension   . Laceration of scalp   . Myocardial infarction [facility])    2011- stent placed  . Nephrolithiasis   . Pre-operative cardiovascular examination   . Rheumatoid arthritis [facility])    sees Dr. Gavin Pound   . SOB (shortness of breath)     Past Surgical History:  Procedure Laterality Date  . BACK  SURGERY    . CARDIAC CATHETERIZATION N/A 01/19/2016   Procedure: Left Heart Cath and Coronary Angiography;  Surgeon: Burnell Blanks, MD;  Location: [facility];  Service: Cardiovascular;  Laterality: N/A;  . COLONOSCOPY  12/09/2014   per Dr. Silverio Decamp, adenomatous polyps, repeat 3 years   . CORONARY ANGIOPLASTY WITH STENT PLACEMENT    . ESOPHAGOGASTRODUODENOSCOPY (EGD) WITH  ESOPHAGEAL DILATION  02/17/09   Barretts esophagus   . EYE SURGERY    . KNEE ARTHROSCOPY Left X 2  . LUMBAR LAMINECTOMY/DECOMPRESSION MICRODISCECTOMY Right 10/07/2017   Procedure: RIGHT LUMBAR ONE-TWO LAMINECTOMY WITH MICRODISCECTOMY;  Surgeon: Consuella Lose, MD;  Location: [facility];  Service: Neurosurgery;  Laterality: Right;  . LUMBAR LAMINECTOMY/DECOMPRESSION MICRODISCECTOMY N/A 12/06/2017   Procedure: RECURRENT MICRODISCECTOMY LUMBAR ONE - LUMBAR TWO;  Surgeon: Consuella Lose, MD;  Location: [place];  Service: Neurosurgery;  Laterality: N/A;  . LUMBAR WOUND DEBRIDEMENT N/A 12/11/2017   Procedure: LUMBAR WOUND DEBRIDEMENT;  Surgeon: Consuella Lose, MD;  Location: [place];  Service: Neurosurgery;  Laterality: N/A;  . POLYPECTOMY    . TONSILLECTOMY    . UPPER GASTROINTESTINAL ENDOSCOPY      Social History  reports that he quit smoking about 31 years ago. His smoking use included cigarettes. He has a 20.00 pack-year smoking history. He has never used smokeless tobacco. He reports current alcohol use. He reports that he does not use drugs.  Allergies  Allergen Reactions  . Cephalexin Shortness Of Breath, Rash and Other (See Comments)    Tolerated Augmentin 2015 and 2017 (12-2017). Tolerated nafcillin 12/2017 PATIENT HAS HAD A PCN REACTION WITH IMMEDIATE RASH, FACIAL/TONGUE/THROAT SWELLING, SOB, OR LIGHTHEADEDNESS WITH HYPOTENSION:  #  #  YES  #  #  Has patient had a PCN reaction causing severe rash involving mucus membranes or skin necrosis: No Has patient had a PCN reaction that required hospitalization: No Has patient had a PCN reaction occurring within the last 10 years: No If all of the above answers are "NO", then may proceed  . Clarithromycin Shortness Of Breath and Rash  . Certolizumab Pegol Hives  . Tocilizumab   . Doxycycline Rash  . Hydroxyzine Rash  . Lidoderm Other (See Comments)    "made me act weird"  . Pyrithione Zinc Rash    Family History  Problem Relation Age  of Onset  . Heart attack Father        cardiovascular disorder  . Arthritis Father        family hx  . Colon cancer Father        mets  . Prostate cancer Father        1st degree relative  . Arthritis Mother   . Colon polyps Mother   . Melanoma Mother   . Dementia Mother   . Diabetes Paternal Uncle   . Colon cancer Paternal Uncle   . Stomach cancer Paternal Uncle   . Melanoma Paternal Uncle   . Cancer Maternal Grandmother   . Skin cancer Daughter   . Colon polyps Sister   . Esophageal cancer Neg Hx   . Rectal cancer Neg Hx      Prior to Admission medications   Medication Sig Start Date End Date Taking? Authorizing Provider  aspirin EC 81 MG tablet Take 1 tablet (81 mg total) by mouth daily. 12/11/17   Costella, Vista Mink, PA-C  metoprolol succinate (TOPROL-XL) 25 MG 24 hr tablet Take 1 tablet (25 mg total) by mouth daily. 05/11/19   Sarajane Jews,  Ishmael Holter, MD  nitroGLYCERIN (NITROSTAT) 0.4 MG SL tablet Place 1 tablet (0.4 mg total) under the tongue every 5 (five) minutes as needed for chest pain (3 doses max). 12/20/17   [name], MD  omeprazole (PRILOSEC) 40 MG capsule Take 1 capsule (40 mg total) by mouth daily. 05/11/19   [name], MD  potassium chloride SA (KLOR-CON M20) 20 MEQ tablet Take 1 tablet (20 mEq total) by mouth daily. Patient not taking: Reported on 03/08/2020 05/11/19   [name], MD  predniSONE (DELTASONE) 5 MG tablet Take 1 tablet (5 mg total) by mouth daily. 03/10/20   Pokhrel, Corrie Mckusick, MD  riTUXimab (RITUXAN) 500 MG/50ML injection Inject into the vein. Every 4 months    [provider]  sildenafil (REVATIO) 20 MG tablet TAKE ONE TABLET BY MOUTH DAILY AS NEEDED Patient not taking: Reported on 03/08/2020 12/18/19   [name], MD  simvastatin (ZOCOR) 40 MG tablet TAKE ONE TABLET BY MOUTH DAILY 09/07/19   [name], MD  tamsulosin (FLOMAX) 0.4 MG CAPS capsule Take 2 capsules (0.8 mg total) by mouth daily. 12/07/19   [name], MD  zolpidem  (AMBIEN) 10 MG tablet TAKE ONE TABLET BY MOUTH EVERY NIGHT AT BEDTIME 12/18/19   [name], MD    Physical Exam: Vitals:   03/16/20 2300 03/16/20 2330 03/16/20 2345 03/17/20 0000  BP: 94/74 122/79 109/77   Pulse: 95 92 99 (!) 130  Resp: (!) 24 (!) 21 (!) 24 (!) 25  Temp:      TempSrc:      SpO2: 91% 96% 93% (!) 85%    Constitutional: NAD, calm, comfortable Vitals:   03/16/20 2300 03/16/20 2330 03/16/20 2345 03/17/20 0000  BP: 94/74 122/79 109/77   Pulse: 95 92 99 (!) 130  Resp: (!) 24 (!) 21 (!) 24 (!) 25  Temp:      TempSrc:      SpO2: 91% 96% 93% (!) 85%   General: WDWN, Alert and oriented x3.  Eyes: EOMI, PERRL, conjunctivae normal.  Sclera nonicteric HENT:  [place]/AT, external ears normal.  Nares patent without epistasis.  Mucous membranes are moist. Neck: Soft, normal range of motion, supple, no masses, no thyromegaly.Trachea midline Respiratory: Equal breath sounds with diffuse rales. no wheezing, no crackles. Normal respiratory effort. No accessory muscle use.  Cardiovascular: Tachycardia, no murmurs / rubs / gallops. No extremity edema. 2+ pedal pulses. Abdomen: Soft, no tenderness, nondistended, no rebound or guarding. No masses palpated. Bowel sounds normoactive Musculoskeletal: FROM. no cyanosis. No joint deformity upper and lower extremities. Normal muscle tone.  Skin: Warm, dry, intact no rashes, lesions, ulcers. No induration Neurologic: CN 2-12 grossly intact. Normal speech. Sensation intact Psychiatric: Normal judgment and insight.  Normal mood.    Labs on Admission: I have personally reviewed following labs and imaging studies  CBC: Recent Labs  Lab 03/16/20 1348  WBC 7.1  HGB 15.0  HCT 45.7  MCV 89.4  PLT 107*    Basic Metabolic Panel: Recent Labs  Lab 03/16/20 1348  NA 135  K 4.4  CL 102  CO2 22  GLUCOSE 123*  BUN 19  CREATININE 1.43*  CALCIUM 8.4*    GFR: CrCl cannot be calculated (Unknown ideal weight.).  Liver Function  Tests: No results for input(s): AST, ALT, ALKPHOS, BILITOT, PROT, ALBUMIN in the last 168 hours.  Urine analysis:    Component Value Date/Time   COLORURINE YELLOW 12/10/2017 1412   APPEARANCEUR HAZY (  A) 12/10/2017 1412   LABSPEC 1.023 12/10/2017 1412   PHURINE 5.0 12/10/2017 1412   GLUCOSEU NEGATIVE 12/10/2017 1412   HGBUR MODERATE (A) 12/10/2017 1412   HGBUR negative 01/26/2010 0852   BILIRUBINUR negative 08/24/2019 1520   KETONESUR NEGATIVE 12/10/2017 1412   PROTEINUR Positive (A) 08/24/2019 1520   PROTEINUR 100 (A) 12/10/2017 1412   UROBILINOGEN 0.2 08/24/2019 1520   UROBILINOGEN 0.2 01/26/2010 0852   NITRITE negative 08/24/2019 1520   NITRITE NEGATIVE 12/10/2017 1412   LEUKOCYTESUR Moderate (2+) (A) 08/24/2019 1520    Radiological Exams on Admission: DG Chest 2 View  Result Date: 03/16/2020 CLINICAL DATA:  Chest pain, COVID-19 positive. EXAM: CHEST - 2 VIEW COMPARISON:  February 29, 2020. FINDINGS: The heart size and mediastinal contours are within normal limits. No pneumothorax or pleural effusion is noted. Multiple patchy airspace opacities are noted bilaterally consistent with multifocal pneumonia. The visualized skeletal structures are unremarkable. IMPRESSION: Bilateral multifocal pneumonia. Electronically Signed   By: Marijo Conception M.D.   On: 03/16/2020 13:58   CT Angio Chest PE W and/or Wo Contrast  Result Date: 03/16/2020 CLINICAL DATA:  COVID, chest pain.  Concern for PE. EXAM: CT ANGIOGRAPHY CHEST WITH CONTRAST TECHNIQUE: Multidetector CT imaging of the chest was performed using the standard protocol during bolus administration of intravenous contrast. Multiplanar CT image reconstructions and MIPs were obtained to evaluate the vascular anatomy. CONTRAST:  39mL OMNIPAQUE IOHEXOL 300 MG/ML  SOLN COMPARISON:  11/27/2010 FINDINGS: Cardiovascular: No filling defects in the pulmonary arteries to suggest pulmonary emboli. Heart is normal size. Aorta is normal caliber. Coronary  artery and aortic calcifications. Mediastinum/Nodes: No mediastinal, hilar, or axillary adenopathy. Trachea and esophagus are unremarkable. Thyroid unremarkable. Lungs/Pleura: Patchy peripheral bilateral airspace opacities, favor related to COVID pneumonia. Mild centrilobular emphysema. No effusions. Upper Abdomen: Insert for abdomen Musculoskeletal: Chest wall soft tissues are unremarkable. No acute bony abnormality. Review of the MIP images confirms the above findings. IMPRESSION: No evidence of pulmonary embolus. Patchy bilateral peripheral airspace opacities most compatible with COVID pneumonia. Coronary artery disease. Aortic Atherosclerosis (ICD10-I70.0) and Emphysema (ICD10-J43.9). Electronically Signed   By: Rolm Baptise M.D.   On: 03/16/2020 20:49    EKG: Independently reviewed.  EKG shows sinus tachycardia with left anterior fascicular block.  No acute ST elevation or depression.  QTc 445  Assessment/Plan Principal Problem:   Multifocal pneumonia Mr. Engen is admitted to cardiac telemetry floor.  He is started on Unasyn for empiric antibiotic coverage while cultures are pending.  He has groundglass opacities consistent with Covid pneumonia on CT of his chest and was diagnosed with Covid and treated with remdesivir last month.  He has had Covid symptoms for the last month and therefore does not need isolation at this time.  Remdesivir would also be of no benefit this far out.  His D-dimer last month was 12 and now is 1.5 so is improving.  Procalcitonin level is less than 0.10 He is requiring supplemental oxygen as he dropped oxygen saturation 85% on room air with minimal ambulation in the emergency room.  Active Problems:   Hypoxia Supplemental oxygen as needed to keep O2 sat 92-96%.     Stage 3b chronic kidney disease  Stable. Monitor renal function and electrolytes on labs in am    COPD with acute exacerbation  Solumedrol q 8 hr x 3 doses.  Combivent every 6 hours Incentive  spirometer every 2 hours while awake.     CAD (coronary artery disease)  Having substernal  chest pain. Initial troponin mildly elevated but did not increase on second check. Will check serial troponin levels. EKG without ischemic changes.    Rheumatoid arthritis Pt followed by Rheumatology.     DVT prophylaxis: Lovenox for DVT prophylaxis. Code Status:   Full code  Family Communication:  Diagnosis and plan discussed with patient.  Patient verbalized understanding agrees with plan.  Further recommendations to follow as clinically indicated Disposition Plan:   Patient is from:  Home  Anticipated DC to:  Home  Anticipated DC date:  Anticipate greater than 2 midnights in hospital to treat acute condition  Anticipated DC barriers: No barriers to discharge identified at this time.  Will be determined during      hospitalization the patient will need home oxygen arranged.   Admission status:  Inpatient   Yevonne Aline Kienna Moncada MD [facility]  How to contact the [facility] Attending or Consulting provider [place] or covering provider during after hours [place], for this patient?   1. Check the care team in [facility] and look for a) attending/consulting [facility] provider listed and b) the [facility] team listed 2. Log into [email or web address] and use [place]'s universal password to access. If you do not have the password, please contact the hospital operator. 3. Locate the [facility] provider you are looking for under [facility] and page to a number that you can be directly reached. 4. If you still have difficulty reaching the provider, please page the [facility] (Director on Call) for the Hospitalists listed on amion for assistance.  03/17/2020, 12:50 AM

## 2020-03-17 NOTE — ED Notes (Signed)
Pt moved from bed to recliner on his own power, pt states that he feels much better. O2 removed and pt's sats stayed above 94% during transfer. Pt sitting up eating dinner at this time. O2 removed for trial period, Will continue to monitor.

## 2020-03-17 NOTE — ED Notes (Signed)
Pt assisted to recliner while bed linens changed and bed cleaned. Pt was diaphoretic in the night and bed linens, gown, and clothes were saturated. Pt tolerated recliner moderately well but states that he feels "a little dizzy" sitting up in the recliner. Pt assisted back in bed with HOB raised. Breakfast at bedside. Pt remains on 2L [place] with O2 95%. O2 dropped to 88 with movement on RA.

## 2020-03-18 ENCOUNTER — Other Ambulatory Visit (HOSPITAL_COMMUNITY): Payer: Medicare Other

## 2020-03-18 DIAGNOSIS — J189 Pneumonia, unspecified organism: Principal | ICD-10-CM

## 2020-03-18 LAB — BASIC METABOLIC PANEL
Anion gap: 11 (ref 5–15)
BUN: 23 mg/dL (ref 8–23)
CO2: 18 mmol/L — ABNORMAL LOW (ref 22–32)
Calcium: 9.2 mg/dL (ref 8.9–10.3)
Chloride: 106 mmol/L (ref 98–111)
Creatinine, Ser: 1.14 mg/dL (ref 0.61–1.24)
GFR, Estimated: >60 mL/min (ref >60)
Glucose, Bld: 191 mg/dL — ABNORMAL HIGH (ref 70–99)
Potassium: 4.3 mmol/L (ref 3.5–5.1)
Sodium: 135 mmol/L (ref 135–145)

## 2020-03-18 LAB — TSH: TSH: 0.444 u[IU]/mL (ref 0.350–4.500)

## 2020-03-18 LAB — PROCALCITONIN: Procalcitonin: <0.1 ng/mL

## 2020-03-18 LAB — CORTISOL: Cortisol, Plasma: 4.6 ug/dL

## 2020-03-18 LAB — CBC
HCT: 35.6 % — ABNORMAL LOW (ref 39.0–52.0)
Hemoglobin: 12.4 g/dL — ABNORMAL LOW (ref 13.0–17.0)
MCH: 30.4 pg (ref 26.0–34.0)
MCHC: 34.8 g/dL (ref 30.0–36.0)
MCV: 87.3 fL (ref 80.0–100.0)
Platelets: 64 10*3/uL — ABNORMAL LOW (ref 150–400)
RBC: 4.08 MIL/uL — ABNORMAL LOW (ref 4.22–5.81)
RDW: 13.9 % (ref 11.5–15.5)
WBC: 6.9 10*3/uL (ref 4.0–10.5)
nRBC: 0 % (ref 0.0–0.2)

## 2020-03-18 MED ORDER — PREDNISONE 20 MG PO TABS
40.0000 mg | ORAL_TABLET | Freq: Every day | ORAL | Status: DC
  Administered 2020-03-18: 40 mg via ORAL
  Filled 2020-03-18: qty 2

## 2020-03-18 MED ORDER — PANTOPRAZOLE SODIUM 40 MG PO TBEC
40.0000 mg | DELAYED_RELEASE_TABLET | Freq: Every day | ORAL | Status: DC
  Administered 2020-03-18: 40 mg via ORAL
  Filled 2020-03-18: qty 1

## 2020-03-18 MED ORDER — PREDNISONE 10 MG PO TABS: ORAL_TABLET | ORAL | 0 refills | Status: DC

## 2020-03-18 NOTE — ED Notes (Signed)
Pt ambulated around the room, oxygen saturation never dropped below 92% on room air.

## 2020-03-18 NOTE — ED Notes (Signed)
Pt sitting up in recliner, 96% on room air, resp e.u, nad noted

## 2020-03-18 NOTE — ED Notes (Signed)
Bair hugger placed on pt.

## 2020-03-18 NOTE — ED Notes (Signed)
Lunch Tray Ordered @ 1030. 

## 2020-03-18 NOTE — ED Notes (Signed)
Breakfast ordered 

## 2020-03-18 NOTE — Progress Notes (Signed)
Cross-coverage note:   Temp 34.7 C orally this am. Patient alert and oriented. Plan to check chem panel, TSH, and cortisol. Check rectal temp. Discussed with RN.

## 2020-03-18 NOTE — Discharge Summary (Signed)
Physician Discharge Summary  Tyrone Roman WVP:[id number] DOB: 1945-02-19 DOA: 03/16/2020  PCP: [name], MD  Admit date: 03/16/2020 Discharge date: 03/18/2020  Admitted From: home Discharge disposition: home   Recommendations for Outpatient Follow-Up:   1. Concern for rapid cessation of prednisone post COVID-- have placed on taper-- please monitor for adrenal insuff after taper    Discharge Diagnosis:   Principal Problem:   Multifocal pneumonia Active Problems:   Rheumatoid arthritis ([place])   Stage 3b chronic kidney disease ([place])   Hypoxia   CAD (coronary artery disease)    Discharge Condition: Improved.  Diet recommendation: Low sodium, heart healthy  Wound care: None.  Code status: Full.   History of Present Illness:   Tyrone Roman is a 75 y.o. male with medical history significant for CAD, COPD, BPH, RA, HTN, HLD who presents with complaint of chest pain and shortness of breath. Has had substernal chest pain that does not radiate for past two days. He has had SOB and cough for the past month. He was admitted for Covid-19 pneumonia on 02/29/20 and treated with remdisivir at that time. He had symptoms for 2 weeks prior to his admission. Felt like he had been recovering from his recent Covid illness however today he started having episodes of chest discomfort, lightheadedness and feeling short of breath.Chest discomfort described as a pressure, mild to moderate in severity, occurring at rest and with exertion.Has a dry cough which is unchanged over the past few weeks. He reports having chills but no fever. He denies having nausea or vomiting. He states his taste has been altered for the past 3-4 weeks. Denies swelling of legs or pain in legs. He has RA and has been compliant with his medications.    Hospital Course by Problem:   ? Adrenal insuff -had a steroid burst w/o taper -steroids stopped on Friday and patient exerted himself on Saturday and then  became sick -longer steroid taper with close outpatient follow up  Multifocal pneumonia -procalcitonin negative and patient much improved -suspect left from prior COVID 19 infection     Hypoxia -resolved quickly -CT negative for PE    Stage 3b chronic kidney disease  -outpatient follow up    Rheumatoid arthritis Pt followed by Rheumatology.     Medical Consultants:      Discharge Exam:   Vitals:   03/18/20 0912 03/18/20 1200  BP: 111/79 115/80  Pulse: 86 86  Resp: 17 18  Temp:  98 F (36.7 C)  SpO2: 92% 92%   Vitals:   03/18/20 0600 03/18/20 0754 03/18/20 0912 03/18/20 1200  BP: 112/74  111/79 115/80  Pulse: 68 78 86 86  Resp: 10 20 17 18   Temp: 97.8 F (36.6 C) (!) 97.5 F (36.4 C)  98 F (36.7 C)  TempSrc: Oral Oral  Oral  SpO2: 94% 94% 92% 92%    General exam: Appears calm and comfortable.    The results of significant diagnostics from this hospitalization (including imaging, microbiology, ancillary and laboratory) are listed below for reference.     Procedures and Diagnostic Studies:   DG Chest 2 View  Result Date: 03/16/2020 CLINICAL DATA:  Chest pain, COVID-19 positive. EXAM: CHEST - 2 VIEW COMPARISON:  February 29, 2020. FINDINGS: The heart size and mediastinal contours are within normal limits. No pneumothorax or pleural effusion is noted. Multiple patchy airspace opacities are noted bilaterally consistent with multifocal pneumonia. The visualized skeletal structures are unremarkable. IMPRESSION: Bilateral  multifocal pneumonia. Electronically Signed   By: Marijo Conception M.D.   On: 03/16/2020 13:58   CT Angio Chest PE W and/or Wo Contrast  Result Date: 03/16/2020 CLINICAL DATA:  COVID, chest pain.  Concern for PE. EXAM: CT ANGIOGRAPHY CHEST WITH CONTRAST TECHNIQUE: Multidetector CT imaging of the chest was performed using the standard protocol during bolus administration of intravenous contrast. Multiplanar CT image reconstructions and MIPs were  obtained to evaluate the vascular anatomy. CONTRAST:  13mL OMNIPAQUE IOHEXOL 300 MG/ML  SOLN COMPARISON:  11/27/2010 FINDINGS: Cardiovascular: No filling defects in the pulmonary arteries to suggest pulmonary emboli. Heart is normal size. Aorta is normal caliber. Coronary artery and aortic calcifications. Mediastinum/Nodes: No mediastinal, hilar, or axillary adenopathy. Trachea and esophagus are unremarkable. Thyroid unremarkable. Lungs/Pleura: Patchy peripheral bilateral airspace opacities, favor related to COVID pneumonia. Mild centrilobular emphysema. No effusions. Upper Abdomen: Insert for abdomen Musculoskeletal: Chest wall soft tissues are unremarkable. No acute bony abnormality. Review of the MIP images confirms the above findings. IMPRESSION: No evidence of pulmonary embolus. Patchy bilateral peripheral airspace opacities most compatible with COVID pneumonia. Coronary artery disease. Aortic Atherosclerosis (ICD10-I70.0) and Emphysema (ICD10-J43.9). Electronically Signed   By: Rolm Baptise M.D.   On: 03/16/2020 20:49     Labs:   Basic Metabolic Panel: Recent Labs  Lab 03/16/20 1348 03/17/20 0250 03/18/20 0431  NA 135 131* 135  K 4.4 3.8 4.3  CL 102 100 106  CO2 22 21* 18*  GLUCOSE 123* 100* 191*  BUN 19 18 23   CREATININE 1.43* 1.31* 1.14  CALCIUM 8.4* 7.8* 9.2   GFR CrCl cannot be calculated (Unknown ideal weight.). Liver Function Tests: No results for input(s): AST, ALT, ALKPHOS, BILITOT, PROT, ALBUMIN in the last 168 hours. No results for input(s): LIPASE, AMYLASE in the last 168 hours. No results for input(s): AMMONIA in the last 168 hours. Coagulation profile No results for input(s): INR, PROTIME in the last 168 hours.  CBC: Recent Labs  Lab 03/16/20 1348 03/17/20 0250 03/18/20 0431  WBC 7.1 5.1 6.9  HGB 15.0 12.9* 12.4*  HCT 45.7 37.7* 35.6*  MCV 89.4 88.1 87.3  PLT 107* 73* 64*   Cardiac Enzymes: No results for input(s): CKTOTAL, CKMB, CKMBINDEX, TROPONINI in  the last 168 hours. BNP: Invalid input(s): POCBNP CBG: No results for input(s): GLUCAP in the last 168 hours. D-Dimer No results for input(s): DDIMER in the last 72 hours. Hgb A1c No results for input(s): HGBA1C in the last 72 hours. Lipid Profile No results for input(s): CHOL, HDL, LDLCALC, TRIG, CHOLHDL, LDLDIRECT in the last 72 hours. Thyroid function studies Recent Labs    03/18/20 0431  TSH 0.444   Anemia work up No results for input(s): VITAMINB12, FOLATE, FERRITIN, TIBC, IRON, RETICCTPCT in the last 72 hours. Microbiology Recent Results (from the past 240 hour(s))  Blood culture (routine x 2)     Status: None (Preliminary result)   Collection Time: 03/16/20  9:30 PM   Specimen: BLOOD RIGHT HAND  Result Value Ref Range Status   Specimen Description BLOOD RIGHT HAND  Final   Special Requests   Final    BOTTLES DRAWN AEROBIC AND ANAEROBIC Blood Culture adequate volume   Culture   Final    NO GROWTH 1 DAY Performed at [facility], [place] [address], [place],  [place] [address]    Report Status PENDING  Incomplete  Blood culture (routine x 2)     Status: None (Preliminary result)   Collection Time: 03/16/20  9:35 PM   Specimen: BLOOD LEFT ARM  Result Value Ref Range Status   Specimen Description BLOOD LEFT ARM  Final   Special Requests   Final    BOTTLES DRAWN AEROBIC AND ANAEROBIC Blood Culture adequate volume   Culture   Final    NO GROWTH 1 DAY Performed at [facility], [address], [place], [place] [address]    Report Status PENDING  Incomplete     Discharge Instructions:   Discharge Instructions    Diet - low sodium heart healthy   Complete by: As directed    Increase activity slowly   Complete by: As directed      Allergies as of 03/18/2020      Reactions   Cephalexin Shortness Of Breath, Rash, Other (See Comments)   Tolerated Augmentin 2015 and 2017 (12-2017). Tolerated nafcillin 12/2017 PATIENT HAS HAD A PCN REACTION WITH IMMEDIATE  RASH, FACIAL/TONGUE/THROAT SWELLING, SOB, OR LIGHTHEADEDNESS WITH HYPOTENSION:  #  #  YES  #  #  Has patient had a PCN reaction causing severe rash involving mucus membranes or skin necrosis: No Has patient had a PCN reaction that required hospitalization: No Has patient had a PCN reaction occurring within the last 10 years: No If all of the above answers are "NO", then may proceed   Clarithromycin Shortness Of Breath, Rash   Certolizumab Pegol Hives   Tocilizumab Hives   Doxycycline Rash   Hydroxyzine Rash   Lidoderm Other (See Comments)   "made me act weird"   Pyrithione Zinc Rash      Medication List    TAKE these medications   acetaminophen 325 MG tablet Commonly known as: TYLENOL Take 650 mg by mouth every 6 (six) hours as needed for headache.   metoprolol succinate 25 MG 24 hr tablet Commonly known as: TOPROL-XL Take 1 tablet (25 mg total) by mouth daily.   nitroGLYCERIN 0.4 MG SL tablet Commonly known as: NITROSTAT Place 1 tablet (0.4 mg total) under the tongue every 5 (five) minutes as needed for chest pain (3 doses max). What changed: reasons to take this   omeprazole 40 MG capsule Commonly known as: PRILOSEC Take 1 capsule (40 mg total) by mouth daily.   potassium chloride SA 20 MEQ tablet Commonly known as: Klor-Con M20 Take 1 tablet (20 mEq total) by mouth daily.   predniSONE 10 MG tablet Commonly known as: DELTASONE 40 mg x 2 days then 30 mg x 2 days then 20 mg x 2 days then 10 mg x 2 days then 5 mg x 2 days and stop What changed:   medication strength  how much to take  how to take this  when to take this  additional instructions   Rituxan 500 MG/50ML injection Generic drug: riTUXimab Inject into the vein. Every 4 months   sildenafil 20 MG tablet Commonly known as: REVATIO TAKE ONE TABLET BY MOUTH DAILY AS NEEDED   simvastatin 40 MG tablet Commonly known as: ZOCOR TAKE ONE TABLET BY MOUTH DAILY What changed: when to take this    tamsulosin 0.4 MG Caps capsule Commonly known as: FLOMAX Take 2 capsules (0.8 mg total) by mouth daily. What changed: how much to take   zolpidem 10 MG tablet Commonly known as: AMBIEN TAKE ONE TABLET BY MOUTH EVERY NIGHT AT BEDTIME What changed:   when to take this  reasons to take this       Follow-up Information    [name], MD  Follow up in 2 week(s).   Specialty: Family Medicine Why: for cortisol check Contact information: [place] [address] [phone]                Time coordinating discharge:35 min  Signed:  Geradine Girt DO  [facility] 03/19/2020, 4:30 PM

## 2020-03-21 ENCOUNTER — Telehealth: Payer: Self-pay | Admitting: Family Medicine

## 2020-03-21 MED ORDER — ALBUTEROL SULFATE HFA 108 (90 BASE) MCG/ACT IN AERS: 2.0000 | INHALATION_SPRAY | RESPIRATORY_TRACT | 2 refills | Status: DC | PRN

## 2020-03-21 NOTE — Telephone Encounter (Signed)
The patient was in the ED and they put him on an inhaler and the inhaler was left at the hospital.  The patient is wanting to know if Dr. Sarajane Jews can call in a Rx for his inhaler. The patient said that is seems to help his breathing.  [place], [place] Phone:  [phone]  Fax:  [phone]

## 2020-03-21 NOTE — Addendum Note (Signed)
Addended by: [name] A on: 03/21/2020 01:17 PM   Modules accepted: Orders

## 2020-03-21 NOTE — Telephone Encounter (Signed)
lft VM that RX was sent to the pharmacy and if any problems picking it up to call us back. Dm/cma

## 2020-03-21 NOTE — Telephone Encounter (Signed)
Please review ER note from 03/16/20 and advise on message below.   Thanks. Dm/cma

## 2020-03-21 NOTE — Telephone Encounter (Signed)
This was sent in  

## 2020-03-23 LAB — CULTURE, BLOOD (ROUTINE X 2)
Culture: NO GROWTH
Culture: NO GROWTH
Special Requests: ADEQUATE
Special Requests: ADEQUATE

## 2020-03-24 ENCOUNTER — Telehealth: Payer: Self-pay | Admitting: Family Medicine

## 2020-03-24 NOTE — Telephone Encounter (Signed)
Patient was prescribed in the hospital Prednisone and pt was taking the medication up until Monday. Patient is calling because since Monday he have not taken any of the medication as prescribe due to he states he messed up and was only taking his other medication.  Patient want to know should he restart prednisone and how much do he need to take when he restarts. [phone].

## 2020-03-25 ENCOUNTER — Other Ambulatory Visit: Payer: Self-pay

## 2020-03-25 ENCOUNTER — Inpatient Hospital Stay (HOSPITAL_COMMUNITY)
Admission: EM | Admit: 2020-03-25 | Discharge: 2020-04-01 | DRG: 871 | Disposition: A | Discharge status: Skilled Nursing Facility | Payer: Medicare Other | Source: Skilled Nursing Facility | Attending: Internal Medicine | Admitting: Internal Medicine

## 2020-03-25 ENCOUNTER — Encounter (HOSPITAL_COMMUNITY): Payer: Self-pay | Admitting: Family Medicine

## 2020-03-25 ENCOUNTER — Emergency Department (HOSPITAL_COMMUNITY): Payer: Medicare Other

## 2020-03-25 DIAGNOSIS — Z8616 Personal history of COVID-19: Secondary | ICD-10-CM

## 2020-03-25 DIAGNOSIS — J189 Pneumonia, unspecified organism: Secondary | ICD-10-CM | POA: Diagnosis not present

## 2020-03-25 DIAGNOSIS — Z20822 Contact with and (suspected) exposure to covid-19: Secondary | ICD-10-CM | POA: Diagnosis present

## 2020-03-25 DIAGNOSIS — E785 Hyperlipidemia, unspecified: Secondary | ICD-10-CM | POA: Diagnosis not present

## 2020-03-25 DIAGNOSIS — N400 Enlarged prostate without lower urinary tract symptoms: Secondary | ICD-10-CM | POA: Diagnosis present

## 2020-03-25 DIAGNOSIS — R0689 Other abnormalities of breathing: Secondary | ICD-10-CM | POA: Diagnosis not present

## 2020-03-25 DIAGNOSIS — R0902 Hypoxemia: Secondary | ICD-10-CM | POA: Diagnosis not present

## 2020-03-25 DIAGNOSIS — I10 Essential (primary) hypertension: Secondary | ICD-10-CM | POA: Diagnosis not present

## 2020-03-25 DIAGNOSIS — D631 Anemia in chronic kidney disease: Secondary | ICD-10-CM | POA: Diagnosis present

## 2020-03-25 DIAGNOSIS — R279 Unspecified lack of coordination: Secondary | ICD-10-CM | POA: Diagnosis not present

## 2020-03-25 DIAGNOSIS — J159 Unspecified bacterial pneumonia: Secondary | ICD-10-CM | POA: Diagnosis present

## 2020-03-25 DIAGNOSIS — R509 Fever, unspecified: Secondary | ICD-10-CM | POA: Diagnosis not present

## 2020-03-25 DIAGNOSIS — Y95 Nosocomial condition: Secondary | ICD-10-CM | POA: Diagnosis present

## 2020-03-25 DIAGNOSIS — D649 Anemia, unspecified: Secondary | ICD-10-CM

## 2020-03-25 DIAGNOSIS — I2510 Atherosclerotic heart disease of native coronary artery without angina pectoris: Secondary | ICD-10-CM | POA: Diagnosis present

## 2020-03-25 DIAGNOSIS — R739 Hyperglycemia, unspecified: Secondary | ICD-10-CM | POA: Diagnosis present

## 2020-03-25 DIAGNOSIS — N1832 Chronic kidney disease, stage 3b: Secondary | ICD-10-CM | POA: Diagnosis not present

## 2020-03-25 DIAGNOSIS — E871 Hypo-osmolality and hyponatremia: Secondary | ICD-10-CM | POA: Diagnosis present

## 2020-03-25 DIAGNOSIS — J9601 Acute respiratory failure with hypoxia: Secondary | ICD-10-CM

## 2020-03-25 DIAGNOSIS — I129 Hypertensive chronic kidney disease with stage 1 through stage 4 chronic kidney disease, or unspecified chronic kidney disease: Secondary | ICD-10-CM | POA: Diagnosis present

## 2020-03-25 DIAGNOSIS — K219 Gastro-esophageal reflux disease without esophagitis: Secondary | ICD-10-CM | POA: Diagnosis not present

## 2020-03-25 DIAGNOSIS — N179 Acute kidney failure, unspecified: Secondary | ICD-10-CM

## 2020-03-25 DIAGNOSIS — Z87891 Personal history of nicotine dependence: Secondary | ICD-10-CM

## 2020-03-25 DIAGNOSIS — I25118 Atherosclerotic heart disease of native coronary artery with other forms of angina pectoris: Secondary | ICD-10-CM | POA: Diagnosis not present

## 2020-03-25 DIAGNOSIS — R4182 Altered mental status, unspecified: Secondary | ICD-10-CM | POA: Diagnosis not present

## 2020-03-25 DIAGNOSIS — I252 Old myocardial infarction: Secondary | ICD-10-CM | POA: Diagnosis not present

## 2020-03-25 DIAGNOSIS — J439 Emphysema, unspecified: Secondary | ICD-10-CM

## 2020-03-25 DIAGNOSIS — Z20828 Contact with and (suspected) exposure to other viral communicable diseases: Secondary | ICD-10-CM | POA: Diagnosis not present

## 2020-03-25 DIAGNOSIS — R5381 Other malaise: Secondary | ICD-10-CM | POA: Diagnosis not present

## 2020-03-25 DIAGNOSIS — M0579 Rheumatoid arthritis with rheumatoid factor of multiple sites without organ or systems involvement: Secondary | ICD-10-CM | POA: Diagnosis not present

## 2020-03-25 DIAGNOSIS — Z955 Presence of coronary angioplasty implant and graft: Secondary | ICD-10-CM | POA: Diagnosis not present

## 2020-03-25 DIAGNOSIS — Z8261 Family history of arthritis: Secondary | ICD-10-CM | POA: Diagnosis not present

## 2020-03-25 DIAGNOSIS — J90 Pleural effusion, not elsewhere classified: Secondary | ICD-10-CM | POA: Diagnosis not present

## 2020-03-25 DIAGNOSIS — Z888 Allergy status to other drugs, medicaments and biological substances status: Secondary | ICD-10-CM

## 2020-03-25 DIAGNOSIS — Z8249 Family history of ischemic heart disease and other diseases of the circulatory system: Secondary | ICD-10-CM | POA: Diagnosis not present

## 2020-03-25 DIAGNOSIS — G9341 Metabolic encephalopathy: Secondary | ICD-10-CM | POA: Diagnosis present

## 2020-03-25 DIAGNOSIS — R000 Tachycardia, unspecified: Secondary | ICD-10-CM | POA: Diagnosis not present

## 2020-03-25 DIAGNOSIS — A419 Sepsis, unspecified organism: Secondary | ICD-10-CM | POA: Diagnosis not present

## 2020-03-25 DIAGNOSIS — J449 Chronic obstructive pulmonary disease, unspecified: Secondary | ICD-10-CM | POA: Diagnosis present

## 2020-03-25 DIAGNOSIS — R799 Abnormal finding of blood chemistry, unspecified: Secondary | ICD-10-CM | POA: Diagnosis not present

## 2020-03-25 DIAGNOSIS — M069 Rheumatoid arthritis, unspecified: Secondary | ICD-10-CM | POA: Diagnosis present

## 2020-03-25 DIAGNOSIS — M7989 Other specified soft tissue disorders: Secondary | ICD-10-CM | POA: Diagnosis not present

## 2020-03-25 DIAGNOSIS — D696 Thrombocytopenia, unspecified: Secondary | ICD-10-CM | POA: Diagnosis present

## 2020-03-25 DIAGNOSIS — M6259 Muscle wasting and atrophy, not elsewhere classified, multiple sites: Secondary | ICD-10-CM | POA: Diagnosis not present

## 2020-03-25 DIAGNOSIS — R531 Weakness: Secondary | ICD-10-CM | POA: Diagnosis not present

## 2020-03-25 DIAGNOSIS — U071 COVID-19: Secondary | ICD-10-CM | POA: Diagnosis not present

## 2020-03-25 DIAGNOSIS — Z79899 Other long term (current) drug therapy: Secondary | ICD-10-CM

## 2020-03-25 DIAGNOSIS — R0602 Shortness of breath: Secondary | ICD-10-CM | POA: Diagnosis not present

## 2020-03-25 DIAGNOSIS — Z743 Need for continuous supervision: Secondary | ICD-10-CM | POA: Diagnosis not present

## 2020-03-25 DIAGNOSIS — Z881 Allergy status to other antibiotic agents status: Secondary | ICD-10-CM

## 2020-03-25 DIAGNOSIS — I9589 Other hypotension: Secondary | ICD-10-CM | POA: Diagnosis not present

## 2020-03-25 DIAGNOSIS — R410 Disorientation, unspecified: Secondary | ICD-10-CM | POA: Diagnosis not present

## 2020-03-25 DIAGNOSIS — R6520 Severe sepsis without septic shock: Secondary | ICD-10-CM | POA: Diagnosis present

## 2020-03-25 DIAGNOSIS — T380X5A Adverse effect of glucocorticoids and synthetic analogues, initial encounter: Secondary | ICD-10-CM | POA: Diagnosis present

## 2020-03-25 DIAGNOSIS — J1282 Pneumonia due to coronavirus disease 2019: Secondary | ICD-10-CM | POA: Diagnosis not present

## 2020-03-25 LAB — CBC WITH DIFFERENTIAL/PLATELET
Abs Immature Granulocytes: 0.08 10*3/uL — ABNORMAL HIGH (ref 0.00–0.07)
Basophils Absolute: 0 10*3/uL (ref 0.0–0.1)
Basophils Relative: 0 %
Eosinophils Absolute: 0.1 10*3/uL (ref 0.0–0.5)
Eosinophils Relative: 2 %
HCT: 39 % (ref 39.0–52.0)
Hemoglobin: 13.1 g/dL (ref 13.0–17.0)
Immature Granulocytes: 1 %
Lymphocytes Relative: 6 %
Lymphs Abs: 0.4 10*3/uL — ABNORMAL LOW (ref 0.7–4.0)
MCH: 29.5 pg (ref 26.0–34.0)
MCHC: 33.6 g/dL (ref 30.0–36.0)
MCV: 87.8 fL (ref 80.0–100.0)
Monocytes Absolute: 0.3 10*3/uL (ref 0.1–1.0)
Monocytes Relative: 4 %
Neutro Abs: 5.1 10*3/uL (ref 1.7–7.7)
Neutrophils Relative %: 87 %
Platelets: 95 10*3/uL — ABNORMAL LOW (ref 150–400)
RBC: 4.44 MIL/uL (ref 4.22–5.81)
RDW: 14.3 % (ref 11.5–15.5)
WBC: 5.9 10*3/uL (ref 4.0–10.5)
nRBC: 0 % (ref 0.0–0.2)

## 2020-03-25 LAB — URINALYSIS, ROUTINE W REFLEX MICROSCOPIC
Bacteria, UA: NONE SEEN
Bilirubin Urine: NEGATIVE
Glucose, UA: NEGATIVE mg/dL
Hgb urine dipstick: NEGATIVE
Ketones, ur: NEGATIVE mg/dL
Leukocytes,Ua: NEGATIVE
Nitrite: NEGATIVE
Protein, ur: 30 mg/dL — AB
Specific Gravity, Urine: 1.02 (ref 1.005–1.030)
pH: 5 (ref 5.0–8.0)

## 2020-03-25 LAB — PROTIME-INR
INR: 1.2 (ref 0.8–1.2)
Prothrombin Time: 14.7 seconds (ref 11.4–15.2)

## 2020-03-25 LAB — EXPECTORATED SPUTUM ASSESSMENT W GRAM STAIN, RFLX TO RESP C

## 2020-03-25 LAB — APTT: aPTT: 35 seconds (ref 24–36)

## 2020-03-25 LAB — COMPREHENSIVE METABOLIC PANEL
ALT: 26 U/L (ref 0–44)
AST: 51 U/L — ABNORMAL HIGH (ref 15–41)
Albumin: 2.3 g/dL — ABNORMAL LOW (ref 3.5–5.0)
Alkaline Phosphatase: 39 U/L (ref 38–126)
Anion gap: 11 (ref 5–15)
BUN: 28 mg/dL — ABNORMAL HIGH (ref 8–23)
CO2: 21 mmol/L — ABNORMAL LOW (ref 22–32)
Calcium: 8.6 mg/dL — ABNORMAL LOW (ref 8.9–10.3)
Chloride: 99 mmol/L (ref 98–111)
Creatinine, Ser: 1.46 mg/dL — ABNORMAL HIGH (ref 0.61–1.24)
GFR, Estimated: 50 mL/min — ABNORMAL LOW (ref >60)
Glucose, Bld: 157 mg/dL — ABNORMAL HIGH (ref 70–99)
Potassium: 3.8 mmol/L (ref 3.5–5.1)
Sodium: 131 mmol/L — ABNORMAL LOW (ref 135–145)
Total Bilirubin: 1.1 mg/dL (ref 0.3–1.2)
Total Protein: 5.4 g/dL — ABNORMAL LOW (ref 6.5–8.1)

## 2020-03-25 LAB — FERRITIN: Ferritin: 2734 ng/mL — ABNORMAL HIGH (ref 24–336)

## 2020-03-25 LAB — RESP PANEL BY RT-PCR (FLU A&B, COVID) ARPGX2
Influenza A by PCR: NEGATIVE
Influenza B by PCR: NEGATIVE
SARS Coronavirus 2 by RT PCR: POSITIVE — AB

## 2020-03-25 LAB — MAGNESIUM: Magnesium: 1.8 mg/dL (ref 1.7–2.4)

## 2020-03-25 LAB — MRSA PCR SCREENING: MRSA by PCR: NEGATIVE

## 2020-03-25 LAB — TROPONIN I (HIGH SENSITIVITY)
Troponin I (High Sensitivity): 51 ng/L — ABNORMAL HIGH (ref <18)
Troponin I (High Sensitivity): 31 ng/L — ABNORMAL HIGH (ref <18)

## 2020-03-25 LAB — C-REACTIVE PROTEIN: CRP: 14.2 mg/dL — ABNORMAL HIGH (ref <1.0)

## 2020-03-25 LAB — BRAIN NATRIURETIC PEPTIDE: B Natriuretic Peptide: 46.5 pg/mL (ref 0.0–100.0)

## 2020-03-25 LAB — LACTIC ACID, PLASMA
Lactic Acid, Venous: 2.3 mmol/L (ref 0.5–1.9)
Lactic Acid, Venous: 2.6 mmol/L (ref 0.5–1.9)

## 2020-03-25 MED ORDER — LACTATED RINGERS IV BOLUS (SEPSIS): 1000.0000 mL | Freq: Once | INTRAVENOUS | Status: DC

## 2020-03-25 MED ORDER — SODIUM CHLORIDE 0.9 % IV SOLN
2.0000 g | Freq: Once | INTRAVENOUS | Status: AC
  Administered 2020-03-25: 2 g via INTRAVENOUS
  Filled 2020-03-25: qty 2

## 2020-03-25 MED ORDER — VANCOMYCIN HCL IN DEXTROSE 1-5 GM/200ML-% IV SOLN: 1000.0000 mg | Freq: Once | INTRAVENOUS | Status: DC

## 2020-03-25 MED ORDER — SODIUM CHLORIDE 0.9% FLUSH
3.0000 mL | Freq: Two times a day (BID) | INTRAVENOUS | Status: DC
  Administered 2020-03-25 – 2020-03-30 (×6): 3 mL via INTRAVENOUS

## 2020-03-25 MED ORDER — LACTATED RINGERS IV BOLUS (SEPSIS)
500.0000 mL | Freq: Once | INTRAVENOUS | Status: AC
Start: 2020-03-25 — End: 2020-03-25
  Administered 2020-03-25: 500 mL via INTRAVENOUS

## 2020-03-25 MED ORDER — VANCOMYCIN HCL IN DEXTROSE 1-5 GM/200ML-% IV SOLN
1000.0000 mg | Freq: Once | INTRAVENOUS | Status: AC
  Administered 2020-03-25: 1000 mg via INTRAVENOUS
  Filled 2020-03-25: qty 200

## 2020-03-25 MED ORDER — SODIUM CHLORIDE 0.9 % IV SOLN: 2.0000 g | Freq: Once | INTRAVENOUS | Status: DC

## 2020-03-25 MED ORDER — LACTATED RINGERS IV SOLN: INTRAVENOUS | Status: DC

## 2020-03-25 MED ORDER — SIMVASTATIN 40 MG PO TABS
40.0000 mg | ORAL_TABLET | Freq: Every day | ORAL | Status: DC
  Administered 2020-03-25 – 2020-04-01 (×8): 40 mg via ORAL
  Filled 2020-03-25 (×4): qty 1
  Filled 2020-03-25: qty 2
  Filled 2020-03-25 (×3): qty 1

## 2020-03-25 MED ORDER — BENZONATATE 100 MG PO CAPS
100.0000 mg | ORAL_CAPSULE | Freq: Three times a day (TID) | ORAL | Status: DC
  Administered 2020-03-25 – 2020-04-01 (×19): 100 mg via ORAL
  Filled 2020-03-25 (×21): qty 1

## 2020-03-25 MED ORDER — LEVALBUTEROL HCL 0.63 MG/3ML IN NEBU: 0.6300 mg | INHALATION_SOLUTION | Freq: Four times a day (QID) | RESPIRATORY_TRACT | Status: DC | PRN

## 2020-03-25 MED ORDER — ACETAMINOPHEN 325 MG PO TABS: 650.0000 mg | ORAL_TABLET | Freq: Once | ORAL | Status: DC

## 2020-03-25 MED ORDER — LACTATED RINGERS IV BOLUS (SEPSIS)
1000.0000 mL | Freq: Once | INTRAVENOUS | Status: AC
  Administered 2020-03-25: 1000 mL via INTRAVENOUS

## 2020-03-25 MED ORDER — ONDANSETRON HCL 4 MG/2ML IJ SOLN
4.0000 mg | Freq: Four times a day (QID) | INTRAMUSCULAR | Status: DC | PRN
  Administered 2020-03-29: 4 mg via INTRAVENOUS
  Filled 2020-03-25: qty 2

## 2020-03-25 MED ORDER — MORPHINE SULFATE (PF) 2 MG/ML IV SOLN: 1.0000 mg | INTRAVENOUS | Status: DC | PRN

## 2020-03-25 MED ORDER — ACETAMINOPHEN 650 MG RE SUPP: 650.0000 mg | Freq: Four times a day (QID) | RECTAL | Status: DC | PRN

## 2020-03-25 MED ORDER — OXYCODONE HCL 5 MG PO TABS
5.0000 mg | ORAL_TABLET | ORAL | Status: DC | PRN
Start: 2020-03-25 — End: 2020-03-30

## 2020-03-25 MED ORDER — SENNOSIDES-DOCUSATE SODIUM 8.6-50 MG PO TABS
1.0000 | ORAL_TABLET | Freq: Every evening | ORAL | Status: DC | PRN
  Administered 2020-04-01: 1 via ORAL
  Filled 2020-03-25: qty 1

## 2020-03-25 MED ORDER — SODIUM CHLORIDE 0.9% FLUSH: 3.0000 mL | INTRAVENOUS | Status: DC | PRN

## 2020-03-25 MED ORDER — ACETAMINOPHEN 325 MG PO TABS
650.0000 mg | ORAL_TABLET | Freq: Four times a day (QID) | ORAL | Status: DC | PRN
  Administered 2020-03-26 (×3): 650 mg via ORAL
  Filled 2020-03-25 (×3): qty 2

## 2020-03-25 MED ORDER — SODIUM CHLORIDE 0.9 % IV SOLN: 250.0000 mL | INTRAVENOUS | Status: DC | PRN

## 2020-03-25 MED ORDER — BISACODYL 5 MG PO TBEC
5.0000 mg | DELAYED_RELEASE_TABLET | Freq: Every day | ORAL | Status: DC | PRN
  Administered 2020-03-26 – 2020-03-27 (×2): 5 mg via ORAL
  Filled 2020-03-25 (×2): qty 1

## 2020-03-25 MED ORDER — SODIUM CHLORIDE 0.9% FLUSH
3.0000 mL | Freq: Two times a day (BID) | INTRAVENOUS | Status: DC
  Administered 2020-03-25 – 2020-03-31 (×11): 3 mL via INTRAVENOUS

## 2020-03-25 MED ORDER — TRAZODONE HCL 50 MG PO TABS: 25.0000 mg | ORAL_TABLET | Freq: Every evening | ORAL | Status: DC | PRN

## 2020-03-25 MED ORDER — NITROGLYCERIN 0.4 MG SL SUBL: 0.4000 mg | SUBLINGUAL_TABLET | SUBLINGUAL | Status: DC | PRN

## 2020-03-25 MED ORDER — ZOLPIDEM TARTRATE 5 MG PO TABS
5.0000 mg | ORAL_TABLET | Freq: Every evening | ORAL | Status: DC | PRN
  Administered 2020-03-31: 5 mg via ORAL
  Filled 2020-03-25: qty 1

## 2020-03-25 MED ORDER — PANTOPRAZOLE SODIUM 40 MG PO TBEC
40.0000 mg | DELAYED_RELEASE_TABLET | Freq: Every day | ORAL | Status: DC
  Administered 2020-03-25 – 2020-04-01 (×8): 40 mg via ORAL
  Filled 2020-03-25 (×8): qty 1

## 2020-03-25 MED ORDER — METOPROLOL SUCCINATE ER 25 MG PO TB24
25.0000 mg | ORAL_TABLET | Freq: Every day | ORAL | Status: DC
  Administered 2020-03-25 – 2020-03-30 (×6): 25 mg via ORAL
  Filled 2020-03-25 (×6): qty 1

## 2020-03-25 MED ORDER — ONDANSETRON HCL 4 MG PO TABS
4.0000 mg | ORAL_TABLET | Freq: Four times a day (QID) | ORAL | Status: DC | PRN
  Filled 2020-03-25: qty 1

## 2020-03-25 MED ORDER — TAMSULOSIN HCL 0.4 MG PO CAPS
0.4000 mg | ORAL_CAPSULE | Freq: Every day | ORAL | Status: DC
  Administered 2020-03-25 – 2020-03-30 (×6): 0.4 mg via ORAL
  Filled 2020-03-25 (×6): qty 1

## 2020-03-25 MED ORDER — HYDRALAZINE HCL 20 MG/ML IJ SOLN: 10.0000 mg | INTRAMUSCULAR | Status: DC | PRN

## 2020-03-25 MED ORDER — VANCOMYCIN HCL IN DEXTROSE 1-5 GM/200ML-% IV SOLN
1000.0000 mg | INTRAVENOUS | Status: DC
  Administered 2020-03-26: 1000 mg via INTRAVENOUS
  Filled 2020-03-25: qty 200

## 2020-03-25 MED ORDER — SODIUM CHLORIDE 0.9 % IV SOLN: 1.0000 g | Freq: Once | INTRAVENOUS | Status: DC

## 2020-03-25 MED ORDER — LACTATED RINGERS IV SOLN: INTRAVENOUS | Status: AC

## 2020-03-25 MED ORDER — HEPARIN SODIUM (PORCINE) 5000 UNIT/ML IJ SOLN
5000.0000 [IU] | Freq: Three times a day (TID) | INTRAMUSCULAR | Status: DC
  Administered 2020-03-25 – 2020-04-01 (×22): 5000 [IU] via SUBCUTANEOUS
  Filled 2020-03-25 (×22): qty 1

## 2020-03-25 MED ORDER — IPRATROPIUM BROMIDE 0.02 % IN SOLN: 0.5000 mg | Freq: Four times a day (QID) | RESPIRATORY_TRACT | Status: DC | PRN

## 2020-03-25 MED ORDER — HYDROMORPHONE HCL 1 MG/ML IJ SOLN: 0.5000 mg | INTRAMUSCULAR | Status: DC | PRN

## 2020-03-25 MED ORDER — SODIUM CHLORIDE 0.9 % IV SOLN
2.0000 g | Freq: Three times a day (TID) | INTRAVENOUS | Status: DC
  Administered 2020-03-26 (×2): 2 g via INTRAVENOUS
  Filled 2020-03-25 (×4): qty 2

## 2020-03-25 NOTE — H&P (Signed)
History and Physical   Patient: Tyrone Roman                            PCP: [name], MD                    DOB: 27-Jul-1945            DOA: 03/25/2020 WYO:[id number]             DOS: 03/25/2020, 5:13 PM  Patient coming from:   HOME  I have personally reviewed patient's medical records, in electronic medical records, including:  [place] [facility], and care everywhere.    Chief Complaint:   Chief Complaint  Patient presents with  . Weakness    History of present illness:    Tyrone Roman is a 75 y.o. male with medical history significant of CAD, HLD, HTN, RA, BPH, COPD (not O2 dependent) , recent Covid infection, Covid pneumonia (02/29/2020 -treated with remdesivir-Taper steroid) Recent hospitalization shortness of breath, multifocal pneumonia, hypoxia discharge 2/10 - 2/12. Presenting today: Once again with shortness of breath cough, weakness, ongoing fever. Reporting since his discharge he has not felt better has been progressively getting worse. He reporting of subjective fever, chills, cough, shortness of breath   Patient Denies having: Chest Pain, Abd pain, N/V/D, headache, dizziness, lightheadedness,  Dysuria, Joint pain, rash, open wounds.   ED Course:   Vitals: T-max 102.9, pulse 84-1 11, respiratory rate 18-23, BP 99/67, satting 90% on room air Abnormal labs; lactic acid 2.3, procalcitonin  <0.10, BUN 28, creatinine 1.46, Troponin flat at 38, 36, Chest x-ray--reviewed radiology reporting bilateral opacity consistent with pneumonia worse on the left Compared to prior studies  Pending SARS-CoV-2 >>    In ED received bolus lactic Ringer's, blood cultures been obtained due to allergies initial dose of vancomycin and Azactam has been initiated.  Patient will be admitted for sepsis-due to pneumonia, hypoxia-acute respiratory failure.  Review of Systems: As per HPI, otherwise 10 point review of systems were negative.    ----------------------------------------------------------------------------------------------------------------------  Allergies  Allergen Reactions  . Cephalexin Shortness Of Breath, Rash and Other (See Comments)    Tolerated Augmentin 2015 and 2017 (12-2017). Tolerated nafcillin 12/2017 PATIENT HAS HAD A PCN REACTION WITH IMMEDIATE RASH, FACIAL/TONGUE/THROAT SWELLING, SOB, OR LIGHTHEADEDNESS WITH HYPOTENSION:  #  #  YES  #  #  Has patient had a PCN reaction causing severe rash involving mucus membranes or skin necrosis: No Has patient had a PCN reaction that required hospitalization: No Has patient had a PCN reaction occurring within the last 10 years: No If all of the above answers are "NO", then may proceed  . Clarithromycin Shortness Of Breath and Rash  . Certolizumab Pegol Hives  . Tocilizumab Hives  . Doxycycline Rash  . Hydroxyzine Rash  . Lidoderm Other (See Comments)    "made me act weird"  . Pyrithione Zinc Rash    Home MEDs:  Prior to Admission medications   Medication Sig Start Date End Date Taking? Authorizing Provider  acetaminophen (TYLENOL) 325 MG tablet Take 650 mg by mouth every 6 (six) hours as needed for headache.   Yes [provider]  albuterol (VENTOLIN HFA) 108 (90 Base) MCG/ACT inhaler Inhale 2 puffs into the lungs every 4 (four) hours as needed for wheezing or shortness of breath. 03/21/20  Yes [name], MD  metoprolol succinate (TOPROL-XL) 25 MG 24 hr tablet Take  1 tablet (25 mg total) by mouth daily. 05/11/19  Yes [name], MD  omeprazole (PRILOSEC) 40 MG capsule Take 1 capsule (40 mg total) by mouth daily. 05/11/19  Yes [name], MD  potassium chloride SA (KLOR-CON M20) 20 MEQ tablet Take 1 tablet (20 mEq total) by mouth daily. 05/11/19  Yes [name], MD  simvastatin (ZOCOR) 40 MG tablet TAKE ONE TABLET BY MOUTH DAILY Patient taking differently: Take 40 mg by mouth daily at 6 PM. 09/07/19  Yes [name], MD  tamsulosin  (FLOMAX) 0.4 MG CAPS capsule Take 2 capsules (0.8 mg total) by mouth daily. Patient taking differently: Take 0.4 mg by mouth daily. 12/07/19  Yes [name], MD  nitroGLYCERIN (NITROSTAT) 0.4 MG SL tablet Place 1 tablet (0.4 mg total) under the tongue every 5 (five) minutes as needed for chest pain (3 doses max). Patient taking differently: Place 0.4 mg under the tongue every 5 (five) minutes as needed for chest pain (max 3 doses). 12/20/17   [name], MD  predniSONE (DELTASONE) 10 MG tablet 40 mg x 2 days then 30 mg x 2 days then 20 mg x 2 days then 10 mg x 2 days then 5 mg x 2 days and stop Patient not taking: Reported on 03/25/2020 03/18/20   Geradine Girt, DO  riTUXimab (RITUXAN) 500 MG/50ML injection Inject into the vein. Every 4 months    [provider]  sildenafil (REVATIO) 20 MG tablet TAKE ONE TABLET BY MOUTH DAILY AS NEEDED Patient taking differently: Take 20 mg by mouth daily as needed. TAKE ONE TABLET BY MOUTH DAILY AS NEEDED 12/18/19   [name], MD  zolpidem (AMBIEN) 10 MG tablet TAKE ONE TABLET BY MOUTH EVERY NIGHT AT BEDTIME Patient taking differently: Take 10 mg by mouth at bedtime as needed for sleep. 12/18/19   [name], MD    PRN MEDs: sodium chloride, acetaminophen **OR** acetaminophen, bisacodyl, hydrALAZINE, HYDROmorphone (DILAUDID) injection, ipratropium, levalbuterol, nitroGLYCERIN, ondansetron **OR** ondansetron (ZOFRAN) IV, oxyCODONE, senna-docusate, sodium chloride flush, zolpidem  Past Medical History:  Diagnosis Date  . Allergy   . Barrett's esophageal ulceration   . BPH (benign prostatic hyperplasia)   . CAD (coronary artery disease)    sees Dr. Mar Daring  . Cataract    both eyes removed   . Colonic polyp   . Concussion with loss of consciousness of 30 minutes or less   . Contact dermatitis   . Contact with or exposure to venereal diseases   . COPD (chronic obstructive pulmonary disease) ([place])    sees Dr. Kara Mead   .  Diverticulosis of colon   . Dysphagia   . GERD (gastroesophageal reflux disease)    past hx- on meds   . HNP (herniated nucleus pulposus), lumbar    recurrent  . Hyperlipidemia   . Hypertension   . Laceration of scalp   . Myocardial infarction [facility])    2011- stent placed  . Nephrolithiasis   . Pre-operative cardiovascular examination   . Rheumatoid arthritis [facility])    sees Dr. Gavin Pound   . SOB (shortness of breath)     Past Surgical History:  Procedure Laterality Date  . BACK SURGERY    . CARDIAC CATHETERIZATION N/A 01/19/2016   Procedure: Left Heart Cath and Coronary Angiography;  Surgeon: Burnell Blanks, MD;  Location: [facility];  Service: Cardiovascular;  Laterality: N/A;  . COLONOSCOPY  12/09/2014   per  Dr. Silverio Decamp, adenomatous polyps, repeat 3 years   . CORONARY ANGIOPLASTY WITH STENT PLACEMENT    . ESOPHAGOGASTRODUODENOSCOPY (EGD) WITH ESOPHAGEAL DILATION  02/17/09   Barretts esophagus   . EYE SURGERY    . KNEE ARTHROSCOPY Left X 2  . LUMBAR LAMINECTOMY/DECOMPRESSION MICRODISCECTOMY Right 10/07/2017   Procedure: RIGHT LUMBAR ONE-TWO LAMINECTOMY WITH MICRODISCECTOMY;  Surgeon: Consuella Lose, MD;  Location: [facility];  Service: Neurosurgery;  Laterality: Right;  . LUMBAR LAMINECTOMY/DECOMPRESSION MICRODISCECTOMY N/A 12/06/2017   Procedure: RECURRENT MICRODISCECTOMY LUMBAR ONE - LUMBAR TWO;  Surgeon: Consuella Lose, MD;  Location: [facility];  Service: Neurosurgery;  Laterality: N/A;  . LUMBAR WOUND DEBRIDEMENT N/A 12/11/2017   Procedure: LUMBAR WOUND DEBRIDEMENT;  Surgeon: Consuella Lose, MD;  Location: [facility];  Service: Neurosurgery;  Laterality: N/A;  . POLYPECTOMY    . TONSILLECTOMY    . UPPER GASTROINTESTINAL ENDOSCOPY       reports that he quit smoking about 31 years ago. His smoking use included cigarettes. He has a 20.00 pack-year smoking history. He has never used smokeless tobacco. He reports current alcohol use. He reports that he does not  use drugs.   Family History  Problem Relation Age of Onset  . Heart attack Father        cardiovascular disorder  . Arthritis Father        family hx  . Colon cancer Father        mets  . Prostate cancer Father        1st degree relative  . Arthritis Mother   . Colon polyps Mother   . Melanoma Mother   . Dementia Mother   . Diabetes Paternal Uncle   . Colon cancer Paternal Uncle   . Stomach cancer Paternal Uncle   . Melanoma Paternal Uncle   . Cancer Maternal Grandmother   . Skin cancer Daughter   . Colon polyps Sister   . Esophageal cancer Neg Hx   . Rectal cancer Neg Hx     Physical Exam:   Vitals:   03/25/20 1500 03/25/20 1542 03/25/20 1600 03/25/20 1657  BP: 97/66  99/67   Pulse: 90  84   Resp: (!) 23 20 (!) 22   Temp:    99.3 F (37.4 C)  TempSrc:    Rectal  SpO2: 91% 93% 90%   Weight:   89.8 kg   Height:   6\' 2"  (1.88 m)    Constitutional: Mild to moderate acute distress with shortness of breath, generalized malaise Eyes: PERRL, lids and conjunctivae normal ENMT: Mucous membranes are moist. Posterior pharynx clear of any exudate or lesions.Normal dentition.  Neck: normal, supple, no masses, no thyromegaly Respiratory: Diffuse rhonchi negative any crackles minimal diffuse wheezing clear to auscultation bilaterally,  Normal respiratory effort. No accessory muscle use.  Cardiovascular: Regular rate and rhythm, no murmurs / rubs / gallops. No extremity edema. 2+ pedal pulses. No carotid bruits.  Abdomen: no tenderness, no masses palpated. No hepatosplenomegaly. Bowel sounds positive.  Musculoskeletal: no clubbing / cyanosis. No joint deformity upper and lower extremities. Good ROM, no contractures. Normal muscle tone.  Neurologic: CN II-XII grossly intact. Sensation intact, DTR normal. Strength 5/5 in all 4.  Psychiatric: Normal judgment and insight. Alert and oriented x 3. Normal mood.  Skin: no rashes, lesions, ulcers. No induration Decubitus/ulcers:  Wounds:  per nursing documentation    Labs on admission:    I have personally reviewed following labs and imaging studies  CBC: Recent Labs  Lab 03/25/20  1451  WBC 5.9  NEUTROABS 5.1  HGB 13.1  HCT 39.0  MCV 87.8  PLT 95*   Basic Metabolic Panel: Recent Labs  Lab 03/25/20 1451  NA 131*  K 3.8  CL 99  CO2 21*  GLUCOSE 157*  BUN 28*  CREATININE 1.46*  CALCIUM 8.6*   GFR: Estimated Creatinine Clearance: 51.6 mL/min (A) (by C-G formula based on SCr of 1.46 mg/dL (H)). Liver Function Tests: Recent Labs  Lab 03/25/20 1451  AST 51*  ALT 26  ALKPHOS 39  BILITOT 1.1  PROT 5.4*  ALBUMIN 2.3*   No results for input(s): LIPASE, AMYLASE in the last 168 hours. No results for input(s): AMMONIA in the last 168 hours. Coagulation Profile: Recent Labs  Lab 03/25/20 1451  INR 1.2   Urine analysis:    Component Value Date/Time   COLORURINE AMBER (A) 03/25/2020 1436   APPEARANCEUR CLEAR 03/25/2020 1436   LABSPEC 1.020 03/25/2020 1436   PHURINE 5.0 03/25/2020 1436   GLUCOSEU NEGATIVE 03/25/2020 1436   HGBUR NEGATIVE 03/25/2020 1436   HGBUR negative 01/26/2010 0852   BILIRUBINUR NEGATIVE 03/25/2020 1436   BILIRUBINUR negative 08/24/2019 1520   KETONESUR NEGATIVE 03/25/2020 1436   PROTEINUR 30 (A) 03/25/2020 1436   UROBILINOGEN 0.2 08/24/2019 1520   UROBILINOGEN 0.2 01/26/2010 0852   NITRITE NEGATIVE 03/25/2020 1436   LEUKOCYTESUR NEGATIVE 03/25/2020 1436     Radiologic Exams on Admission:   DG Chest Port 1 View  Result Date: 03/25/2020 CLINICAL DATA:  Possible sepsis.  Shortness of breath and confusion. EXAM: PORTABLE CHEST 1 VIEW COMPARISON:  PA and lateral chest and CT chest 03/16/2020. FINDINGS: Patchy bilateral airspace disease persists and appears slightly worse on the left. No pneumothorax or pleural fluid. Heart size is normal. No acute or focal bony abnormality. IMPRESSION: Patchy bilateral airspace disease consistent with pneumonia appears slightly worse on the  left compared to the prior exam Electronically Signed   By: Inge Rise M.D.   On: 03/25/2020 14:58    EKG:   Independently reviewed.  Orders placed or performed during the hospital encounter of 03/25/20  . ED EKG 12-Lead  . ED EKG 12-Lead   ---------------------------------------------------------------------------------------------------------------------------------------    Assessment / Plan:   Principal Problem:   Sepsis ([place]) Active Problems:   AKI (acute kidney injury) ([place])   Hyperlipidemia   Coronary artery disease of native artery of native heart with stable angina pectoris (HCC)   GERD   COPD (chronic obstructive pulmonary disease) (HCC)   Rheumatoid arthritis (HCC)   Pneumonia due to COVID-19 virus   Stage 3b chronic kidney disease ([place])   Hypoxia   Principal Problem:   Sepsis ([place]) -likely source multifocal pneumonia -in the setting of recent SARS-CoV-2 infection Meets the sepsis criteria: T-max 102.9, pulse 84-111, RR18-23, BP 99/67, satting 90% on room air Abnormal labs; lactic acid 2.3, procalcitonin  <0.10, BUN 28, creatinine 1.46,  Chest x-ray--reviewed radiology reporting bilateral opacity consistent with pneumonia worse on the left Compared to prior studies Repeat SARS-CoV-2 >> pending (recent Covid infection 02/29/2020 with recent hospitalization and discharged on 03/19/20  -Blood cultures obtained >> -Sputum culture will be obtained>> -Due to patient's allergies, broad-spectrum antibiotics of vancomycin and Azactam initiated - We will follow with cultures-narrow down antibiotics accordingly  Acute respiratory failure - in setting of multifocal pneumonia in the setting of recent SARS-CoV-2 infection, with underlying COPD -Respiratory failure multifactorial, pneumonia COPD recent Covid infection -Patient has minimum wheezing -if worsen anticipating initiation of steroids at  this time would hold -Recent Covid 2 infection in January status post  hospitalization and treatment- will monitor -Continue underlying treatment of sepsis pneumonia with IV antibiotics, will follow cultures -DuoNeb bronchodilators -Mucolytic's, and antitussives -Providing supplemental oxygen to maintain O2 sat greater 90% -Patient is not O2 dependent at home  Acute on chronic kidney disease stage IIIb -Creatinine ranges between 1.3-2.02 -BUN 28, creatinine 1.46 today -We will continue with gentle IV fluid hydration -We will try to avoid nephrotoxins (will dose vancomycin per pharmacy to avoid further renal damage_     Coronary artery disease -with h/o intermittent angina  -Was complaining of mild substernal chest pain in ED but has resolved -currently denies chest pain, troponin flat-repeating troponin, as needed nitroglycerin, morphine -No significant changes on EKG, or heart monitor  Generalized weakness/debility -Likely exacerbated due to recent infection, hospitalizations -Consulting PT/OT for evaluation recommendation  Active Problems:   Hyperlipidemia -continue statins    GERD -continue PPI   COPD (chronic obstructive pulmonary disease) (HCC) -nebs and O2 supplement as above   Rheumatoid arthritis ([place]) -monitoring, when stable resuming home meds Recent history of pneumonia due to COVID-19 virus -management as above BPH -continue home medication of Flomax Insomnia -continue as needed home medication of Ambien      Cultures:  -03/25/2020 blood cultures x2 >> -03/25/2020 sputum cultures-to be obtained Antimicrobial: -03/25/2020 IV Azactam/vancomycin  Consults called:  None ------------------------------------------------------------------------------------------------------------------------------------ DVT prophylaxis: SCD/Compression stockings and Heparin SQ Code Status:   Code Status: Full Code   Admission status: Patient will be admitted as Inpatient, with a greater than 2 midnight length of stay. Level of care:  Telemetry   Family Communication:  none at bedside  (The above findings and plan of care has been discussed with patient in detail, the patient expressed understanding and agreement of above plan)  --------------------------------------------------------------------------------------------------------------------------------------------------  Disposition Plan: >3 days Status is: Inpatient  Remains inpatient appropriate because:Hemodynamically unstable and Inpatient level of care appropriate due to severity of illness   Dispo: The patient is from: Home              Anticipated d/c is to: Home              Anticipated d/c date is: > 3 days              Patient currently is not medically stable to d/c.   Difficult to place patient No    ------------------------------------------------------------------------------------------------------------------------------------  Time spent: > than  55  Min.   SIGNED: Deatra James, MD, FHM. [facility],  Pager (Please use [email or web address] to page to text)  If 7PM-7AM, please contact night-coverage [email or web address],  03/25/2020, 5:13 PM

## 2020-03-25 NOTE — ED Notes (Signed)
Charge RN made this RN aware of pts assigned inpatient unit expressing concern that pt needs higher level of care.  This RN made admitting provider MD Shahmehdi aware.

## 2020-03-25 NOTE — Telephone Encounter (Signed)
He can simply stop the prednisone at this point

## 2020-03-25 NOTE — Progress Notes (Signed)
[facility]link following Code Sepsis. 

## 2020-03-25 NOTE — ED Provider Notes (Addendum)
[place] DEPT Provider Note   CSN: [id number] Arrival date & time: 03/25/20  1358     History Chief Complaint  Patient presents with  . Weakness    Tyrone Roman is a 75 y.o. male.  HPI 75 year old male with a history of CAD, BPH, MI, rheumatoid arthritis, COPD, recent COVID-19 infection in January with hospitalization, and subsequent hospitalization on 2/9 for bacterial pneumonia with discharged on 03/18/2020 presents to the ER with complaints of weakness, cough, ongoing fevers.  He states that his daughter checked on him today and stated that he looked very sick.  He reports some burning with urination for the last several days as well.  No flank pain.  No nausea or vomiting.  No syncope.  Per chart review, patient had a CT PE study done on 2/9 which was negative, but did show multifocal pneumonia.  Per chart review, do not see any discharge with antibiotics.  Past Medical History:  Diagnosis Date  . Allergy   . Barrett's esophageal ulceration   . BPH (benign prostatic hyperplasia)   . CAD (coronary artery disease)    sees Dr. Mar Daring  . Cataract    both eyes removed   . Colonic polyp   . Concussion with loss of consciousness of 30 minutes or less   . Contact dermatitis   . Contact with or exposure to venereal diseases   . COPD (chronic obstructive pulmonary disease) ([place])    sees Dr. Kara Mead   . Diverticulosis of colon   . Dysphagia   . GERD (gastroesophageal reflux disease)    past hx- on meds   . HNP (herniated nucleus pulposus), lumbar    recurrent  . Hyperlipidemia   . Hypertension   . Laceration of scalp   . Myocardial infarction [facility])    2011- stent placed  . Nephrolithiasis   . Pre-operative cardiovascular examination   . Rheumatoid arthritis [facility])    sees Dr. Gavin Pound   . SOB (shortness of breath)     Patient Active Problem List   Diagnosis Date Noted  . Sepsis ([place]) 03/25/2020  . Multifocal pneumonia  03/17/2020  . COPD with acute exacerbation ([place]) 03/17/2020  . Hypoxia 03/17/2020  . CAD (coronary artery disease) 03/17/2020  . Thrombocytopenia ([place]) 03/08/2020  . Stage 3b chronic kidney disease ([place]) 03/08/2020  . Pneumonia due to COVID-19 virus 02/29/2020  . Acute respiratory failure due to COVID-19 ([place]) 02/29/2020  . Closed nondisplaced fracture of shaft of third metacarpal bone of left hand 03/04/2019  . Wound infection after surgery 01/01/2018  . Medication monitoring encounter 01/01/2018  . AKI (acute kidney injury) ([place]) 12/10/2017  . HNP (herniated nucleus pulposus), lumbar 10/07/2017  . Lumbar herniated disc 10/07/2017  . Psoriasis of scalp 03/27/2016  . Rheumatoid arthritis ([place]) 03/25/2013  . GI bleeding 02/20/2012  . Dyspnea on exertion 09/08/2010  . COPD (chronic obstructive pulmonary disease) ([place]) 08/04/2010  . Bruising 08/04/2010  . TINNITUS 12/16/2009  . Dizziness and giddiness 12/16/2009  . NECK SPRAIN AND STRAIN 10/21/2009  . LUMBAR SPRAIN AND STRAIN 10/21/2009  . Hyperlipidemia 06/10/2009  . Coronary artery disease of native artery of native heart with stable angina pectoris ([place]) 06/10/2009  . GERD 06/10/2009  . BARRETTS ESOPHAGUS 06/10/2009  . CONCUSSION WITH LOC OF 30 MINUTES OR LESS 02/28/2009  . NEPHROLITHIASIS 07/01/2007  . CONTACT DERMATITIS 12/12/2006  . BPH with urinary obstruction 11/04/2006  . COLONIC POLYPS 10/21/2003  . DIVERTICULOSIS, COLON 10/21/2003  Past Surgical History:  Procedure Laterality Date  . BACK SURGERY    . CARDIAC CATHETERIZATION N/A 01/19/2016   Procedure: Left Heart Cath and Coronary Angiography;  Surgeon: Burnell Blanks, MD;  Location: [facility] [place] [facility];  Service: Cardiovascular;  Laterality: N/A;  . COLONOSCOPY  12/09/2014   per Dr. Silverio Decamp, adenomatous polyps, repeat 3 years   . CORONARY ANGIOPLASTY WITH STENT PLACEMENT    . ESOPHAGOGASTRODUODENOSCOPY (EGD) WITH ESOPHAGEAL DILATION  02/17/09   Barretts  esophagus   . EYE SURGERY    . KNEE ARTHROSCOPY Left X 2  . LUMBAR LAMINECTOMY/DECOMPRESSION MICRODISCECTOMY Right 10/07/2017   Procedure: RIGHT LUMBAR ONE-TWO LAMINECTOMY WITH MICRODISCECTOMY;  Surgeon: Consuella Lose, MD;  Location: [place];  Service: Neurosurgery;  Laterality: Right;  . LUMBAR LAMINECTOMY/DECOMPRESSION MICRODISCECTOMY N/A 12/06/2017   Procedure: RECURRENT MICRODISCECTOMY LUMBAR ONE - LUMBAR TWO;  Surgeon: Consuella Lose, MD;  Location: [place];  Service: Neurosurgery;  Laterality: N/A;  . LUMBAR WOUND DEBRIDEMENT N/A 12/11/2017   Procedure: LUMBAR WOUND DEBRIDEMENT;  Surgeon: Consuella Lose, MD;  Location: [place];  Service: Neurosurgery;  Laterality: N/A;  . POLYPECTOMY    . TONSILLECTOMY    . UPPER GASTROINTESTINAL ENDOSCOPY         Family History  Problem Relation Age of Onset  . Heart attack Father        cardiovascular disorder  . Arthritis Father        family hx  . Colon cancer Father        mets  . Prostate cancer Father        1st degree relative  . Arthritis Mother   . Colon polyps Mother   . Melanoma Mother   . Dementia Mother   . Diabetes Paternal Uncle   . Colon cancer Paternal Uncle   . Stomach cancer Paternal Uncle   . Melanoma Paternal Uncle   . Cancer Maternal Grandmother   . Skin cancer Daughter   . Colon polyps Sister   . Esophageal cancer Neg Hx   . Rectal cancer Neg Hx     Social History   Tobacco Use  . Smoking status: Former Smoker    Packs/day: 1.00    Years: 20.00    Pack years: 20.00    Types: Cigarettes    Quit date: 10/12/1988    Years since quitting: 31.4  . Smokeless tobacco: Never Used  . Tobacco comment: 1960's   Vaping Use  . Vaping Use: Never used  Substance Use Topics  . Alcohol use: Yes    Alcohol/week: 0.0 standard drinks    Comment: rare  . Drug use: No    Home Medications Prior to Admission medications   Medication Sig Start Date End Date Taking? Authorizing Provider  acetaminophen (TYLENOL)  325 MG tablet Take 650 mg by mouth every 6 (six) hours as needed for headache.   Yes [provider]  albuterol (VENTOLIN HFA) 108 (90 Base) MCG/ACT inhaler Inhale 2 puffs into the lungs every 4 (four) hours as needed for wheezing or shortness of breath. 03/21/20  Yes [name], MD  metoprolol succinate (TOPROL-XL) 25 MG 24 hr tablet Take 1 tablet (25 mg total) by mouth daily. 05/11/19  Yes [name], MD  omeprazole (PRILOSEC) 40 MG capsule Take 1 capsule (40 mg total) by mouth daily. 05/11/19  Yes [name], MD  potassium chloride SA (KLOR-CON M20) 20 MEQ tablet Take 1 tablet (20 mEq total) by mouth daily. 05/11/19  Yes Sarajane Jews,  Ishmael Holter, MD  simvastatin (ZOCOR) 40 MG tablet TAKE ONE TABLET BY MOUTH DAILY Patient taking differently: Take 40 mg by mouth daily at 6 PM. 09/07/19  Yes [name], MD  tamsulosin (FLOMAX) 0.4 MG CAPS capsule Take 2 capsules (0.8 mg total) by mouth daily. Patient taking differently: Take 0.4 mg by mouth daily. 12/07/19  Yes [name], MD  nitroGLYCERIN (NITROSTAT) 0.4 MG SL tablet Place 1 tablet (0.4 mg total) under the tongue every 5 (five) minutes as needed for chest pain (3 doses max). Patient taking differently: Place 0.4 mg under the tongue every 5 (five) minutes as needed for chest pain (max 3 doses). 12/20/17   [name], MD  predniSONE (DELTASONE) 10 MG tablet 40 mg x 2 days then 30 mg x 2 days then 20 mg x 2 days then 10 mg x 2 days then 5 mg x 2 days and stop Patient not taking: Reported on 03/25/2020 03/18/20   Geradine Girt, DO  riTUXimab (RITUXAN) 500 MG/50ML injection Inject into the vein. Every 4 months    [provider]  sildenafil (REVATIO) 20 MG tablet TAKE ONE TABLET BY MOUTH DAILY AS NEEDED Patient taking differently: Take 20 mg by mouth daily as needed. TAKE ONE TABLET BY MOUTH DAILY AS NEEDED 12/18/19   [name], MD  zolpidem (AMBIEN) 10 MG tablet TAKE ONE TABLET BY MOUTH EVERY NIGHT AT BEDTIME Patient taking  differently: Take 10 mg by mouth at bedtime as needed for sleep. 12/18/19   [name], MD    Allergies    Cephalexin, Clarithromycin, Certolizumab pegol, Tocilizumab, Doxycycline, Hydroxyzine, Lidoderm, and Pyrithione zinc  Review of Systems   Review of Systems  Physical Exam Updated Vital Signs BP 99/67   Pulse 84   Temp 99.3 F (37.4 C) (Rectal)   Resp (!) 22   Ht 6\' 2"  (1.88 m)   Wt 89.8 kg   SpO2 90%   BMI 25.42 kg/m   Physical Exam Vitals and nursing note reviewed.  Constitutional:      Appearance: He is well-developed and well-nourished.  HENT:     Head: Normocephalic and atraumatic.  Eyes:     Conjunctiva/sclera: Conjunctivae normal.  Cardiovascular:     Rate and Rhythm: Normal rate and regular rhythm.     Heart sounds: No murmur heard.   Pulmonary:     Effort: Pulmonary effort is normal. No respiratory distress.     Breath sounds: Rales present.     Comments: Scattered rales in the lower lobes, no audible wheezes, speaking in full sentences, intermittent nonproductive coughing Abdominal:     Palpations: Abdomen is soft.     Tenderness: There is no abdominal tenderness.  Musculoskeletal:        General: No tenderness, deformity or edema. Normal range of motion.     Cervical back: Neck supple.  Skin:    General: Skin is warm and dry.  Neurological:     General: No focal deficit present.     Mental Status: He is alert and oriented to person, place, and time.     Sensory: No sensory deficit.     Motor: No weakness.  Psychiatric:        Mood and Affect: Mood and affect and mood normal.        Behavior: Behavior normal.     ED Results / Procedures / Treatments   Labs (all labs ordered are listed, but only abnormal results are displayed)  Labs Reviewed  LACTIC ACID, PLASMA - Abnormal; Notable for the following components:      Result Value   Lactic Acid, Venous 2.3 (*)    All other components within normal limits  COMPREHENSIVE METABOLIC PANEL -  Abnormal; Notable for the following components:   Sodium 131 (*)    CO2 21 (*)    Glucose, Bld 157 (*)    BUN 28 (*)    Creatinine, Ser 1.46 (*)    Calcium 8.6 (*)    Total Protein 5.4 (*)    Albumin 2.3 (*)    AST 51 (*)    GFR, Estimated 50 (*)    All other components within normal limits  CBC WITH DIFFERENTIAL/PLATELET - Abnormal; Notable for the following components:   Platelets 95 (*)    Lymphs Abs 0.4 (*)    Abs Immature Granulocytes 0.08 (*)    All other components within normal limits  URINALYSIS, ROUTINE W REFLEX MICROSCOPIC - Abnormal; Notable for the following components:   Color, Urine AMBER (*)    Protein, ur 30 (*)    All other components within normal limits  URINE CULTURE  CULTURE, BLOOD (ROUTINE X 2)  CULTURE, BLOOD (ROUTINE X 2)  RESP PANEL BY RT-PCR (FLU A&B, COVID) ARPGX2  EXPECTORATED SPUTUM ASSESSMENT W GRAM STAIN, RFLX TO RESP C  PROTIME-INR  APTT  LACTIC ACID, PLASMA  BRAIN NATRIURETIC PEPTIDE  BASIC METABOLIC PANEL  CBC  PROTIME-INR  MAGNESIUM    EKG EKG Interpretation  Date/Time:  Friday March 25 2020 14:12:01 EST Ventricular Rate:  109 PR Interval:    QRS Duration: 126 QT Interval:  329 QTC Calculation: 443 R Axis:   -96 Text Interpretation: Sinus tachycardia Atrial premature complex RBBB and LAFB ST elevation, consider inferior injury No significant change since last tracing Confirmed by Blanchie Dessert ([id number]) on 03/25/2020 2:39:41 PM   Radiology DG Chest Port 1 View  Result Date: 03/25/2020 CLINICAL DATA:  Possible sepsis.  Shortness of breath and confusion. EXAM: PORTABLE CHEST 1 VIEW COMPARISON:  PA and lateral chest and CT chest 03/16/2020. FINDINGS: Patchy bilateral airspace disease persists and appears slightly worse on the left. No pneumothorax or pleural fluid. Heart size is normal. No acute or focal bony abnormality. IMPRESSION: Patchy bilateral airspace disease consistent with pneumonia appears slightly worse on the left  compared to the prior exam Electronically Signed   By: Inge Rise M.D.   On: 03/25/2020 14:58    Procedures .Critical Care Performed by: Garald Balding, PA-C Authorized by: Garald Balding, PA-C   Critical care provider statement:    Critical care time (minutes):  45   Critical care was necessary to treat or prevent imminent or life-threatening deterioration of the following conditions:  Sepsis   Critical care was time spent personally by me on the following activities:  Discussions with consultants, evaluation of patient's response to treatment, examination of patient, ordering and performing treatments and interventions, ordering and review of laboratory studies, ordering and review of radiographic studies, pulse oximetry, re-evaluation of patient's condition, obtaining history from patient or surrogate and review of old charts     Medications Ordered in ED Medications  aztreonam (AZACTAM) 2 g in sodium chloride 0.9 % 100 mL IVPB (2 g Intravenous New Bag/Given 03/25/20 1653)  acetaminophen (TYLENOL) tablet 650 mg (0 mg Oral Hold 03/25/20 1618)  lactated ringers infusion ( Intravenous New Bag/Given 03/25/20 1649)  lactated ringers bolus 1,000 mL (1,000 mLs Intravenous New Bag/Given 03/25/20 1647)  And  lactated ringers bolus 1,000 mL (has no administration in time range)  vancomycin (VANCOCIN) IVPB 1000 mg/200 mL premix (1,000 mg Intravenous New Bag/Given 03/25/20 1655)  heparin injection 5,000 Units (has no administration in time range)  sodium chloride flush (NS) 0.9 % injection 3 mL (has no administration in time range)  sodium chloride flush (NS) 0.9 % injection 3 mL (has no administration in time range)  sodium chloride flush (NS) 0.9 % injection 3 mL (has no administration in time range)  0.9 %  sodium chloride infusion (has no administration in time range)  acetaminophen (TYLENOL) tablet 650 mg (has no administration in time range)    Or  acetaminophen (TYLENOL) suppository  650 mg (has no administration in time range)  oxyCODONE (Oxy IR/ROXICODONE) immediate release tablet 5 mg (has no administration in time range)  HYDROmorphone (DILAUDID) injection 0.5-1 mg (has no administration in time range)  traZODone (DESYREL) tablet 25 mg (has no administration in time range)  senna-docusate (Senokot-S) tablet 1 tablet (has no administration in time range)  bisacodyl (DULCOLAX) EC tablet 5 mg (has no administration in time range)  ondansetron (ZOFRAN) tablet 4 mg (has no administration in time range)    Or  ondansetron (ZOFRAN) injection 4 mg (has no administration in time range)  ipratropium (ATROVENT) nebulizer solution 0.5 mg (has no administration in time range)  levalbuterol (XOPENEX) nebulizer solution 0.63 mg (has no administration in time range)  hydrALAZINE (APRESOLINE) injection 10 mg (has no administration in time range)  lactated ringers infusion (has no administration in time range)  lactated ringers bolus 500 mL (0 mLs Intravenous Stopped 03/25/20 1617)    ED Course  I have reviewed the triage vital signs and the nursing notes.  Pertinent labs & imaging results that were available during my care of the patient were reviewed by me and considered in my medical decision making (see chart for details).    MDM Rules/Calculators/A&P                          75 year old male who presents to the ER for weakness On arrival, blood pressures soft with a blood pressure of 92/67, febrile with a temperature of 102.9.  Initially tachycardic at 105.  No evidence of tachypnea on my exam.  Lung sounds with scattered rales in lower lobes.  Abdomen is soft and nontender.  He is alert and oriented x4.  Labs ordered, reviewed and interpreted by me -CBC without leukocytosis, stable hemoglobin. -CMP with hyponatremia of 131, creatinine of 1.46 with a BUN of 28 which does appear to be slightly elevated from baseline.  Mild elevation of the AST of 51. -UA without evidence of  UTI. -Lactic acid of 2.3 -Normal PT/INR -Blood cultures pending  Meeting ordered, reviewed and interpreted by me and radiology -Chest x-ray with worsening pneumonia  EKG shows sinus tach at 109  MDM: Patient with evidence of sepsis in the setting of worsening bacterial pneumonia, question hospital-acquired given recent hospitalization.  Code sepsis was initiated on presentation, he was started on hospital-acquired pneumonia antibiotics vancomycin and aztreonam given his cephalosporin allergy.  Fluid resuscitation initiated per order set.  No evidence of hypoxia at this time.  Consulted hospitalist team who will admit the patient for further evaluation and treatment.  Covid test ordered per their request.  Spoke with the patient and his daughter and informed them that the patient will be admitted.  They are agreeable to this.  Stable  for admission at this time  This was a shared visit with my supervising physician Dr. Maryan Rued who independently saw and evaluated the patient & provided guidance in evaluation/management/disposition ,in agreement with care   Final Clinical Impression(s) / ED Diagnoses Final diagnoses:  Sepsis, due to unspecified organism, unspecified whether acute organ dysfunction present [facility])  Hospital-acquired pneumonia    Rx / DC Orders ED Discharge Orders    None           Lyndel Safe 03/25/20 2101    Blanchie Dessert, MD 03/26/20 2132

## 2020-03-25 NOTE — ED Notes (Signed)
RN entered room to draw repeat lactic acid, pt reports new onset chest pain 8/10. Pt denies ShOB.  Admitting provider made aware and is at bedside.

## 2020-03-25 NOTE — Progress Notes (Signed)
Pharmacy Antibiotic Note  Tyrone Roman is a 75 y.o. male admitted on 03/25/2020 with pneumonia.  Pharmacy has been consulted for vancomycin + aztreonam dosing. Pt has cephalosporin allergy.  Today, 03/25/20  WBC WNL  SCr 1.46, CrCl ~51 mL/min  Lactate 2.3  Plan:  Aztreonam 2 g IV q8h  Vancomycin 1000 mg IV q24h  Goal vancomycin AUC 400-550  Follow renal function and culture data  Ordered MRSA PCR. If negative and respiratory only suspected source of infection, recommend d/c vancomycin  Height: 6\' 2"  (188 cm) Weight: 89.8 kg (198 lb) IBW/kg (Calculated) : 82.2  Temp (24hrs), Avg:100.1 F (37.8 C), Min:98 F (36.7 C), Max:102.9 F (39.4 C)  Recent Labs  Lab 03/25/20 1451  WBC 5.9  CREATININE 1.46*  LATICACIDVEN 2.3*    Estimated Creatinine Clearance: 51.6 mL/min (A) (by C-G formula based on SCr of 1.46 mg/dL (H)).    Allergies  Allergen Reactions  . Cephalexin Shortness Of Breath, Rash and Other (See Comments)    Tolerated Augmentin 2015 and 2017 (12-2017). Tolerated nafcillin 12/2017 PATIENT HAS HAD A PCN REACTION WITH IMMEDIATE RASH, FACIAL/TONGUE/THROAT SWELLING, SOB, OR LIGHTHEADEDNESS WITH HYPOTENSION:  #  #  YES  #  #  Has patient had a PCN reaction causing severe rash involving mucus membranes or skin necrosis: No Has patient had a PCN reaction that required hospitalization: No Has patient had a PCN reaction occurring within the last 10 years: No If all of the above answers are "NO", then may proceed  . Clarithromycin Shortness Of Breath and Rash  . Certolizumab Pegol Hives  . Tocilizumab Hives  . Doxycycline Rash  . Hydroxyzine Rash  . Lidoderm Other (See Comments)    "made me act weird"  . Pyrithione Zinc Rash    Antimicrobials this admission: vancomycin 2/18 >>  aztreonam 2/18 >>   Dose adjustments this admission:  Microbiology results: 2/18 BCx:  2/18 UCx:   2/18 MRSA PCR:   Thank you for allowing pharmacy to be a part of this  patient's care.  Lenis Noon, PharmD 03/25/2020 6:17 PM

## 2020-03-25 NOTE — ED Triage Notes (Signed)
Pt BIBA from home0   Per EMS- pt dx with COVID 19 on 1/12, resulted in getting pneumonia. D/c from [facility] appx one week ago, reports ongoing overall feeling "ill" has progressively gotten weaker, difficulty with ambulation. Pt alert on arrival.   20 g PIV L AC. 1000 mg tylenol given 1330--> 102.4 temporal by EMS 300 mL NaCL

## 2020-03-25 NOTE — Progress Notes (Signed)
A consult was received from an ED provider for vancomycin and aztreonam per pharmacy dosing.  The patient's profile has been reviewed for ht/wt/allergies/indication/available labs.   No weight in chart, STAT ht/wt order entered.   A one time order has been placed for aztreonam 2 g IV once + vancomycin 1000 mg IV once.  Further antibiotics/pharmacy consults should be ordered by admitting physician if indicated.                       Thank you, Lenis Noon, PharmD 03/25/2020  4:24 PM

## 2020-03-25 NOTE — Progress Notes (Signed)
[facility] has been following this Code Sepsis. Asked RN to document reason for late blood cultures and antibiotics. Stat height and weight ordered by pharmacy at 1624.

## 2020-03-25 NOTE — Sepsis Progress Note (Signed)
Spoke with RN via secure chat.  Verified repeat lactic was drawn & sent at 1746, as it shows in Epic chart review.  Lab has been contacted regarding result, not posted.  RN sent a new specimen.

## 2020-03-25 NOTE — ED Notes (Signed)
IV start and blood culture collection attempted x3, unsuccessful.  Will ask another RN to attempt.

## 2020-03-25 NOTE — ED Notes (Signed)
Called report to [place] on 4W

## 2020-03-25 NOTE — Telephone Encounter (Signed)
Left message on machine for patient to return our call 

## 2020-03-26 ENCOUNTER — Inpatient Hospital Stay (HOSPITAL_COMMUNITY): Payer: Medicare Other

## 2020-03-26 DIAGNOSIS — D649 Anemia, unspecified: Secondary | ICD-10-CM

## 2020-03-26 DIAGNOSIS — U071 COVID-19: Secondary | ICD-10-CM

## 2020-03-26 DIAGNOSIS — J1282 Pneumonia due to coronavirus disease 2019: Secondary | ICD-10-CM

## 2020-03-26 DIAGNOSIS — R6520 Severe sepsis without septic shock: Secondary | ICD-10-CM | POA: Diagnosis not present

## 2020-03-26 DIAGNOSIS — A419 Sepsis, unspecified organism: Secondary | ICD-10-CM | POA: Diagnosis not present

## 2020-03-26 DIAGNOSIS — R5381 Other malaise: Secondary | ICD-10-CM | POA: Diagnosis not present

## 2020-03-26 LAB — BASIC METABOLIC PANEL
Anion gap: 8 (ref 5–15)
BUN: 23 mg/dL (ref 8–23)
CO2: 22 mmol/L (ref 22–32)
Calcium: 8.2 mg/dL — ABNORMAL LOW (ref 8.9–10.3)
Chloride: 102 mmol/L (ref 98–111)
Creatinine, Ser: 1.26 mg/dL — ABNORMAL HIGH (ref 0.61–1.24)
GFR, Estimated: 60 mL/min — ABNORMAL LOW (ref >60)
Glucose, Bld: 156 mg/dL — ABNORMAL HIGH (ref 70–99)
Potassium: 3.7 mmol/L (ref 3.5–5.1)
Sodium: 132 mmol/L — ABNORMAL LOW (ref 135–145)

## 2020-03-26 LAB — BLOOD CULTURE ID PANEL (REFLEXED) - BCID2

## 2020-03-26 LAB — LACTIC ACID, PLASMA: Lactic Acid, Venous: 1.3 mmol/L (ref 0.5–1.9)

## 2020-03-26 LAB — PROTIME-INR
INR: 1.3 — ABNORMAL HIGH (ref 0.8–1.2)
Prothrombin Time: 15.3 seconds — ABNORMAL HIGH (ref 11.4–15.2)

## 2020-03-26 LAB — CBC
HCT: 33.5 % — ABNORMAL LOW (ref 39.0–52.0)
Hemoglobin: 11 g/dL — ABNORMAL LOW (ref 13.0–17.0)
MCH: 29.3 pg (ref 26.0–34.0)
MCHC: 32.8 g/dL (ref 30.0–36.0)
MCV: 89.3 fL (ref 80.0–100.0)
Platelets: 73 10*3/uL — ABNORMAL LOW (ref 150–400)
RBC: 3.75 MIL/uL — ABNORMAL LOW (ref 4.22–5.81)
RDW: 14.5 % (ref 11.5–15.5)
WBC: 4.9 10*3/uL (ref 4.0–10.5)
nRBC: 0 % (ref 0.0–0.2)

## 2020-03-26 LAB — C-REACTIVE PROTEIN: CRP: 17.3 mg/dL — ABNORMAL HIGH (ref <1.0)

## 2020-03-26 LAB — D-DIMER, QUANTITATIVE: D-Dimer, Quant: 2.17 ug/mL-FEU — ABNORMAL HIGH (ref 0.00–0.50)

## 2020-03-26 LAB — PROCALCITONIN: Procalcitonin: <0.1 ng/mL

## 2020-03-26 LAB — LACTATE DEHYDROGENASE: LDH: 293 U/L — ABNORMAL HIGH (ref 98–192)

## 2020-03-26 LAB — CORTISOL-AM, BLOOD: Cortisol - AM: 17.2 ug/dL (ref 6.7–22.6)

## 2020-03-26 MED ORDER — ACETAMINOPHEN 325 MG PO TABS
650.0000 mg | ORAL_TABLET | ORAL | Status: DC | PRN
  Administered 2020-03-26 – 2020-03-30 (×4): 650 mg via ORAL
  Filled 2020-03-26 (×4): qty 2

## 2020-03-26 MED ORDER — MORPHINE SULFATE (PF) 2 MG/ML IV SOLN: 1.0000 mg | INTRAVENOUS | Status: DC | PRN

## 2020-03-26 MED ORDER — IOHEXOL 350 MG/ML SOLN
100.0000 mL | Freq: Once | INTRAVENOUS | Status: AC | PRN
  Administered 2020-03-26: 100 mL via INTRAVENOUS

## 2020-03-26 MED ORDER — PIPERACILLIN-TAZOBACTAM 3.375 G IVPB
3.3750 g | Freq: Three times a day (TID) | INTRAVENOUS | Status: DC
  Administered 2020-03-26 – 2020-04-01 (×18): 3.375 g via INTRAVENOUS
  Filled 2020-03-26 (×18): qty 50

## 2020-03-26 MED ORDER — HYDROMORPHONE HCL 1 MG/ML IJ SOLN: 0.5000 mg | INTRAMUSCULAR | Status: DC | PRN

## 2020-03-26 MED ORDER — VANCOMYCIN HCL 1500 MG/300ML IV SOLN
1500.0000 mg | INTRAVENOUS | Status: DC
  Administered 2020-03-27 – 2020-03-28 (×2): 1500 mg via INTRAVENOUS
  Filled 2020-03-26 (×2): qty 300

## 2020-03-26 NOTE — Assessment & Plan Note (Addendum)
-  Fever, tachycardia, tachypnea, lactic acidosis.  Source presumed lung at this time -Continue trending procalcitonin (negative x 2) -Initially started on vancomycin and aztreonam.  MRSA swab is negative - was febrile most of 2/19, then admission blood culture grew 1/4 bottles with MRSE (likely contaminant but given frailty of patient, will continue appropriate coverage and repeat cultures). Hold off on echo for now - culture has further speciated to [place], moreso a contaminate at this time; okay to d/c vanc (still noting having intermittent fever) - continue zosyn  -Follow-up cultures

## 2020-03-26 NOTE — Assessment & Plan Note (Signed)
-   Continue Flomax 

## 2020-03-26 NOTE — Assessment & Plan Note (Signed)
Continue statin. 

## 2020-03-26 NOTE — Progress Notes (Signed)
PROGRESS NOTE    Tyrone Roman   QVH:[id number]  DOB: [birth year]  DOA: 03/25/2020     1  PCP: [name], MD  CC: SOB  Hospital Course: Tyrone Roman is a 75 yo male with PMH CAD, HTN, HLD, RA, BPH, COPD (not on chronic O2) and recent COVID-19 infection (02/29/20, s/p Remdesivir/steroids) followed by hospitalziation 2/10 - 2/12 for SOB/hypoxia. He has been relatively immobile at home due to ongoing shortness of breath and has not been doing much other than laying in bed.  He again presents back to the ER with his ongoing shortness of breath as well as fevers, chills, cough productive of brown sputum. He underwent CXR in the ER which was notable for bilateral opacities concerning for worsening pneumonia.  Lactic was mildly elevated, procalcitonin was negative.  He was saturating well on room air and did not require oxygen. He was placed on antibiotics and received IV fluids for presumed sepsis due to pneumonia. Repeat Covid PCR was again positive on 03/25/2020.   Interval History:  Still short of breath resting in bed when seen this morning.  Endorsed his ongoing weakness and shortness of breath at home.  Confirmed that he has been mostly laying in bed over the past week due to being tired and short of breath.  Old records reviewed in assessment of this patient  ROS: Constitutional: positive for fatigue and malaise, Respiratory: positive for cough and dyspnea on exertion, Cardiovascular: negative for chest pain and Gastrointestinal: negative for abdominal pain  Assessment & Plan: * Sepsis ([place]) -Fever, tachycardia, tachypnea.  Source presumed lung at this time -Continue trending procalcitonin -Initially started on vancomycin and aztreonam.  MRSA swab is negative, okay to discontinue vancomycin -Continue monotherapy aztreonam for now -Follow-up cultures  Pneumonia due to COVID-19 virus - currently on RA - s/p treatment previously with remdesivir; no indication for steroids unless  hypoxic - continue breathing treatments, IS, and supportive care  -Given ongoing shortness of breath and DOE with ongoing symptoms, immobility, elevated dimer, will check CTA chest to evaluate for PE  Physical deconditioning -Considered due to underlying recent COVID.  Patient has been very immobile at home resting mostly in bed -Follow-up PT eval  Stage 3b chronic kidney disease ([place]) - patient has history of CKD3b. Baseline creat ~ 1.4 - 1.6, eGFR 48-50 - at baseline - gentle IVF in anticipation of CTA chest   Normocytic anemia - likely combo of ACD and acute illness - no s/s bleeding - trend CBC while in hospital   COPD (chronic obstructive pulmonary disease) (HCC) - no s/s exacerbation - continue routine care  BPH (benign prostatic hyperplasia) -Continue Flomax  GERD -Continue PPI  Coronary artery disease of native artery of native heart with stable angina pectoris ([place]) - transient CP in ER that since resolved - flat trops (51>>31); no ischemic changes noted on EKG  Hyperlipidemia -Continue statin    Antimicrobials: Vanc 2/18>>2/19 Aztreonam 2/18>> current   DVT prophylaxis: heparin injection 5,000 Units Start: 03/25/20 2200 SCDs Start: 03/25/20 1703 TED hose Start: 03/25/20 1659 SCDs Start: 03/25/20 1659   Code Status:   Code Status: Full Code Family Communication: none present  Disposition Plan: Status is: Inpatient  Remains inpatient appropriate because:IV treatments appropriate due to intensity of illness or inability to take PO and Inpatient level of care appropriate due to severity of illness   Dispo: The patient is from: Home  Anticipated d/c is to: pending PT eval              Anticipated d/c date is: 3 days              Patient currently is not medically stable to d/c.   Difficult to place patient No  Risk of unplanned readmission score: Unplanned Admission- Pilot do not use: 27.09   Objective: Blood pressure 119/80, pulse  (!) 107, temperature 99.7 F (37.6 C), temperature source Oral, resp. rate (!) 22, height 6' 2" (1.88 m), weight 92 kg, SpO2 92 %.  Examination: General appearance: alert, cooperative, fatigued and no distress Head: Normocephalic, without obvious abnormality, atraumatic Eyes: EOMI Lungs: mild coarse sounds but relatively clear, no wheezing Heart: regular rate and rhythm and S1, S2 normal Abdomen: normal findings: bowel sounds normal and soft, non-tender Extremities: no edema;negative Homan's Skin: mobility and turgor normal Neurologic: Grossly normal  Consultants:   n/a  Procedures:   n/a  Data Reviewed: I have personally reviewed following labs and imaging studies Results for orders placed or performed during the hospital encounter of 03/25/20 (from the past 24 hour(s))  Brain natriuretic peptide     Status: None   Collection Time: 03/25/20  3:08 PM  Result Value Ref Range   B Natriuretic Peptide 46.5 0.0 - 100.0 pg/mL  Magnesium     Status: None   Collection Time: 03/25/20  3:08 PM  Result Value Ref Range   Magnesium 1.8 1.7 - 2.4 mg/dL  Blood Culture (routine x 2)     Status: None (Preliminary result)   Collection Time: 03/25/20  4:45 PM   Specimen: BLOOD LEFT HAND  Result Value Ref Range   Specimen Description      BLOOD LEFT HAND Performed at [facility], [place] [address] [place], [place] [address]    Special Requests      BOTTLES DRAWN AEROBIC AND ANAEROBIC Blood Culture adequate volume Performed at [facility] [address], [place], [place] [address]    Culture      NO GROWTH < 12 HOURS Performed at [facility] [address] [place], [place] [address]    Report Status PENDING   Blood Culture (routine x 2)     Status: None (Preliminary result)   Collection Time: 03/25/20  4:45 PM   Specimen: BLOOD LEFT FOREARM  Result Value Ref Range   Specimen Description      BLOOD LEFT FOREARM Performed at [facility] [address], [place], [place] [address]    Special Requests      BOTTLES DRAWN AEROBIC AND ANAEROBIC Blood Culture adequate volume Performed at [facility] [address], [place], [place] [address]    Culture      NO GROWTH < 12 HOURS Performed at [facility] [address], [place], [place] [address]    Report Status PENDING   Resp Panel by RT-PCR (Flu A&B, Covid) Nasopharyngeal Swab     Status: Abnormal   Collection Time: 03/25/20  5:47 PM   Specimen: Nasopharyngeal Swab; Nasopharyngeal(NP) swabs in vial transport medium  Result Value Ref Range   SARS Coronavirus 2 by RT PCR POSITIVE (A) NEGATIVE   Influenza A by PCR NEGATIVE NEGATIVE   Influenza B by PCR NEGATIVE NEGATIVE  Troponin I (High Sensitivity)     Status: Abnormal   Collection Time: 03/25/20  8:02 PM  Result Value Ref Range  Troponin I (High Sensitivity) 51 (H) <18 ng/L  Lactic acid, plasma     Status: Abnormal   Collection Time: 03/25/20  8:38 PM  Result Value Ref Range   Lactic Acid, Venous 2.6 (HH) 0.5 - 1.9 mmol/L  Culture, sputum-assessment     Status: None   Collection Time: 03/25/20  8:38 PM   Specimen: Expectorated Sputum  Result Value Ref Range   Specimen Description EXPECTORATED SPUTUM    Special Requests NONE    Sputum evaluation      THIS SPECIMEN IS ACCEPTABLE FOR SPUTUM CULTURE Performed at [facility] [address] [place], [place] [address]    Report Status 03/25/2020 FINAL   MRSA PCR Screening     Status: None   Collection Time: 03/25/20  8:38 PM   Specimen: Nasal Mucosa; Nasopharyngeal  Result Value Ref Range   MRSA by PCR NEGATIVE NEGATIVE  Culture, Respiratory w Gram Stain     Status: None (Preliminary result)   Collection Time: 03/25/20  8:38 PM  Result Value Ref Range   Specimen Description      EXPECTORATED SPUTUM Performed at [facility], [place] [address] [place], [place] [address]     Special Requests      NONE Reflexed from [id number] Performed at [facility], [place] [address], [place], [place] [address]    Gram Stain      NO WBC SEEN FEW SQUAMOUS EPITHELIAL CELLS PRESENT ABUNDANT GRAM POSITIVE COCCI ABUNDANT GRAM NEGATIVE RODS MODERATE GRAM POSITIVE RODS Performed at [facility], [place] [address] [place], [place] [address]    Culture PENDING    Report Status PENDING   Troponin I (High Sensitivity)     Status: Abnormal   Collection Time: 03/25/20 10:45 PM  Result Value Ref Range   Troponin I (High Sensitivity) 31 (H) <18 ng/L  Basic metabolic panel     Status: Abnormal   Collection Time: 03/26/20  1:49 AM  Result Value Ref Range   Sodium 132 (L) 135 - 145 mmol/L   Potassium 3.7 3.5 - 5.1 mmol/L   Chloride 102 98 - 111 mmol/L   CO2 22 22 - 32 mmol/L   Glucose, Bld 156 (H) 70 - 99 mg/dL   BUN 23 8 - 23 mg/dL   Creatinine, Ser 1.26 (H) 0.61 - 1.24 mg/dL   Calcium 8.2 (L) 8.9 - 10.3 mg/dL   GFR, Estimated 60 (L) >60 mL/min   Anion gap 8 5 - 15  CBC     Status: Abnormal   Collection Time: 03/26/20  1:49 AM  Result Value Ref Range   WBC 4.9 4.0 - 10.5 K/uL   RBC 3.75 (L) 4.22 - 5.81 MIL/uL   Hemoglobin 11.0 (L) 13.0 - 17.0 g/dL   HCT 33.5 (L) 39.0 - 52.0 %   MCV 89.3 80.0 - 100.0 fL   MCH 29.3 26.0 - 34.0 pg   MCHC 32.8 30.0 - 36.0 g/dL   RDW 14.5 11.5 - 15.5 %   Platelets 73 (L) 150 - 400 K/uL   nRBC 0.0 0.0 - 0.2 %  Protime-INR     Status: Abnormal   Collection Time: 03/26/20  1:49 AM  Result Value Ref Range   Prothrombin Time 15.3 (H) 11.4 - 15.2 seconds   INR 1.3 (H) 0.8 - 1.2  Cortisol-am, blood     Status: None   Collection Time: 03/26/20  1:49 AM  Result Value Ref Range   Cortisol -  AM 17.2 6.7 - 22.6 ug/dL  Procalcitonin     Status: None   Collection Time: 03/26/20  1:49 AM  Result Value Ref Range   Procalcitonin <0.10 ng/mL  C-reactive protein     Status: Abnormal   Collection Time: 03/26/20  8:47 AM  Result Value  Ref Range   CRP 17.3 (H) <1.0 mg/dL  D-dimer, quantitative     Status: Abnormal   Collection Time: 03/26/20  8:47 AM  Result Value Ref Range   D-Dimer, Quant 2.17 (H) 0.00 - 0.50 ug/mL-FEU  Lactate dehydrogenase     Status: Abnormal   Collection Time: 03/26/20  8:47 AM  Result Value Ref Range   LDH 293 (H) 98 - 192 U/L  Lactic acid, plasma     Status: None   Collection Time: 03/26/20  8:47 AM  Result Value Ref Range   Lactic Acid, Venous 1.3 0.5 - 1.9 mmol/L    Recent Results (from the past 240 hour(s))  Blood culture (routine x 2)     Status: None   Collection Time: 03/16/20  9:30 PM   Specimen: BLOOD RIGHT HAND  Result Value Ref Range Status   Specimen Description BLOOD RIGHT HAND  Final   Special Requests   Final    BOTTLES DRAWN AEROBIC AND ANAEROBIC Blood Culture adequate volume   Culture   Final    NO GROWTH 6 DAYS Performed at [facility], [address], [place], [place] [address]    Report Status 03/23/2020 FINAL  Final  Blood culture (routine x 2)     Status: None   Collection Time: 03/16/20  9:35 PM   Specimen: BLOOD LEFT ARM  Result Value Ref Range Status   Specimen Description BLOOD LEFT ARM  Final   Special Requests   Final    BOTTLES DRAWN AEROBIC AND ANAEROBIC Blood Culture adequate volume   Culture   Final    NO GROWTH 6 DAYS Performed at [facility], [place] [address], [place], [place] [address]    Report Status 03/23/2020 FINAL  Final  Blood Culture (routine x 2)     Status: None (Preliminary result)   Collection Time: 03/25/20  4:45 PM   Specimen: BLOOD LEFT HAND  Result Value Ref Range Status   Specimen Description   Final    BLOOD LEFT HAND Performed at [facility] [address], [place], [place] [address]    Special Requests   Final    BOTTLES DRAWN AEROBIC AND ANAEROBIC Blood Culture adequate volume Performed at [facility] [address], [place], [place] [address]    Culture   Final     NO GROWTH < 12 HOURS Performed at [facility] [address], [place], [place] [address]    Report Status PENDING  Incomplete  Blood Culture (routine x 2)     Status: None (Preliminary result)   Collection Time: 03/25/20  4:45 PM   Specimen: BLOOD LEFT FOREARM  Result Value Ref Range Status   Specimen Description   Final    BLOOD LEFT FOREARM Performed at [facility] [address] [place], [place] [address]    Special Requests   Final    BOTTLES DRAWN AEROBIC AND ANAEROBIC Blood Culture adequate volume Performed at [facility] [address] [place], [place] [address]    Culture   Final    NO GROWTH < 12 HOURS Performed at [facility], [place] [address], [place], [name] [address]    Report Status PENDING  Incomplete  Resp Panel by RT-PCR (Flu A&B, Covid) Nasopharyngeal Swab     Status: Abnormal   Collection Time: 03/25/20  5:47 PM   Specimen: Nasopharyngeal Swab; Nasopharyngeal(NP) swabs in vial transport medium  Result Value Ref Range Status   SARS Coronavirus 2 by RT PCR POSITIVE (A) NEGATIVE Final    Comment: RESULT CALLED TO, READ BACK BY AND VERIFIED WITH: C SIMPSON AT 1852 ON 03/25/2020 BY JPM (NOTE) SARS-CoV-2 target nucleic acids are DETECTED.  The SARS-CoV-2 RNA is generally detectable in upper respiratory specimens during the acute phase of infection. Positive results are indicative of the presence of the identified virus, but do not rule out bacterial infection or co-infection with other pathogens not detected by the test. Clinical correlation with patient history and other diagnostic information is necessary to determine patient infection status. The expected result is Negative.  Fact Sheet for Patients: EntrepreneurPulse.com.au  Fact Sheet for Healthcare Providers: IncredibleEmployment.be  This test is not yet approved or cleared by the [place] FDA and  has been  authorized for detection and/or diagnosis of SARS-CoV-2 by FDA under an Emergency Use Authorization (EUA).  This EUA will remain in effect (meaning this test ca n be used) for the duration of  the COVID-19 declaration under Section 564(b)(1) of the Act, 21 U.S.C. section 360bbb-3(b)(1), unless the authorization is terminated or revoked sooner.     Influenza A by PCR NEGATIVE NEGATIVE Final   Influenza B by PCR NEGATIVE NEGATIVE Final    Comment: (NOTE) The Xpert Xpress SARS-CoV-2/FLU/RSV plus assay is intended as an aid in the diagnosis of influenza from Nasopharyngeal swab specimens and should not be used as a sole basis for treatment. Nasal washings and aspirates are unacceptable for Xpert Xpress SARS-CoV-2/FLU/RSV testing.  Fact Sheet for Patients: EntrepreneurPulse.com.au  Fact Sheet for Healthcare Providers: IncredibleEmployment.be  This test is not yet approved or cleared by the [place] FDA and has been authorized for detection and/or diagnosis of SARS-CoV-2 by FDA under an Emergency Use Authorization (EUA). This EUA will remain in effect (meaning this test can be used) for the duration of the COVID-19 declaration under Section 564(b)(1) of the Act, 21 U.S.C. section 360bbb-3(b)(1), unless the authorization is terminated or revoked.  Performed at [facility], [place] [address] [place], [place] [address]   Culture, sputum-assessment     Status: None   Collection Time: 03/25/20  8:38 PM   Specimen: Expectorated Sputum  Result Value Ref Range Status   Specimen Description EXPECTORATED SPUTUM  Final   Special Requests NONE  Final   Sputum evaluation   Final    THIS SPECIMEN IS ACCEPTABLE FOR SPUTUM CULTURE Performed at [facility], [place] [address] [place], [place] [address]    Report Status 03/25/2020 FINAL  Final  MRSA PCR Screening     Status: None   Collection Time: 03/25/20  8:38 PM    Specimen: Nasal Mucosa; Nasopharyngeal  Result Value Ref Range Status   MRSA by PCR NEGATIVE NEGATIVE Final    Comment:        The GeneXpert MRSA Assay (FDA approved for NASAL specimens only), is one component of a comprehensive MRSA colonization surveillance program. It is not intended to diagnose MRSA infection nor to guide or monitor treatment for MRSA infections. Performed at [facility], [place] [address] [place], [place] [address]   Culture, Respiratory  w Gram Stain     Status: None (Preliminary result)   Collection Time: 03/25/20  8:38 PM  Result Value Ref Range Status   Specimen Description   Final    EXPECTORATED SPUTUM Performed at [facility] [address], [place], [place] [address]    Special Requests   Final    NONE Reflexed from [id number] Performed at [facility] [address], [place], [place] [address]    Gram Stain   Final    NO WBC SEEN FEW SQUAMOUS EPITHELIAL CELLS PRESENT ABUNDANT GRAM POSITIVE COCCI ABUNDANT GRAM NEGATIVE RODS MODERATE GRAM POSITIVE RODS Performed at [facility], [place] [address], [place], [place] [address]    Culture PENDING  Incomplete   Report Status PENDING  Incomplete     Radiology Studies: DG Chest Port 1 View  Result Date: 03/25/2020 CLINICAL DATA:  Possible sepsis.  Shortness of breath and confusion. EXAM: PORTABLE CHEST 1 VIEW COMPARISON:  PA and lateral chest and CT chest 03/16/2020. FINDINGS: Patchy bilateral airspace disease persists and appears slightly worse on the left. No pneumothorax or pleural fluid. Heart size is normal. No acute or focal bony abnormality. IMPRESSION: Patchy bilateral airspace disease consistent with pneumonia appears slightly worse on the left compared to the prior exam Electronically Signed   By: Inge Rise M.D.   On: 03/25/2020 14:58   DG Chest Port 1 View  Final Result    CT ANGIO CHEST PE W OR WO CONTRAST    (Results  Pending)    Scheduled Meds: . benzonatate  100 mg Oral TID  . heparin  5,000 Units Subcutaneous Q8H  . metoprolol succinate  25 mg Oral Daily  . pantoprazole  40 mg Oral Daily  . simvastatin  40 mg Oral q1800  . sodium chloride flush  3 mL Intravenous Q12H  . sodium chloride flush  3 mL Intravenous Q12H  . tamsulosin  0.4 mg Oral Daily   PRN Meds: sodium chloride, acetaminophen **OR** acetaminophen, bisacodyl, hydrALAZINE, HYDROmorphone (DILAUDID) injection, ipratropium, levalbuterol, morphine injection, nitroGLYCERIN, ondansetron **OR** ondansetron (ZOFRAN) IV, oxyCODONE, senna-docusate, sodium chloride flush, zolpidem Continuous Infusions: . sodium chloride    . aztreonam 2 g (03/26/20 1025)  . lactated ringers 75 mL/hr at 03/26/20 0831     LOS: 1 day  Time spent: Greater than 50% of the 35 minute visit was spent in counseling/coordination of care for the patient as laid out in the A&P.   Dwyane Dee, MD [facility] 03/26/2020, 3:04 PM

## 2020-03-26 NOTE — Assessment & Plan Note (Addendum)
-  Considered due to underlying recent COVID.  Patient has been very immobile at home resting mostly in bed -Follow-up PT eval: stable for HHPT/OT

## 2020-03-26 NOTE — Assessment & Plan Note (Addendum)
-   currently on RA on admission; he is not hypoxic but he requested O2 for subjective comfort; we will remove O2 unless he meets criteria/becomes hypoxic - s/p treatment previously with remdesivir - continue breathing treatments, IS, and supportive care  -Given ongoing shortness of breath and DOE with ongoing symptoms, immobility, elevated dimer, will check CTA chest to evaluate for PE (negative for PE, shows worsening GGO) - given concern for superimposed bacterial infeciton, continue antibiotics

## 2020-03-26 NOTE — Assessment & Plan Note (Signed)
-   transient CP in ER that since resolved - flat trops (51>>31); no ischemic changes noted on EKG

## 2020-03-26 NOTE — Assessment & Plan Note (Deleted)
-  Fever, tachycardia, tachypnea.  Source presumed lung at this time -Continue trending procalcitonin -Initially started on vancomycin and aztreonam.  MRSA swab is negative, okay to discontinue vancomycin -Continue monotherapy aztreonam for now -Follow-up cultures

## 2020-03-26 NOTE — Progress Notes (Signed)
PHARMACY - PHYSICIAN COMMUNICATION CRITICAL VALUE ALERT - BLOOD CULTURE IDENTIFICATION (BCID)  Tyrone Roman is a 75 y.o. male with COVID-19 in January and  recently hospitalized from 2/9-2/11 for PNA presented back to the ED  on 03/25/2020 with c/o generalized weakness. He was started on aztreonam and vancomycin on admission for PNA.  MRSA PCR came back negative with vancomycin d/ced on 2/19 AM and aztreonam changed to zosyn since pt tolerated unasyn/augmentin in the past.  He remains febrile. Micro lab reported on 2/19 that 1 of 4 blood cx bottles now has GPC (BCID = staph epi, mecA+). Pharmacy has been consulted to resume vancomycin back.  Name of physician (or Provider) Contacted: Dr. Sabino Gasser  Current antibiotics: zosyn  Changes to prescribed antibiotics recommended:  - start vancomycin  Results for orders placed or performed during the hospital encounter of 03/25/20  Blood Culture ID Panel (Reflexed) (Collected: 03/25/2020  4:45 PM)  Result Value Ref Range   Enterococcus faecalis NOT DETECTED NOT DETECTED   Enterococcus Faecium NOT DETECTED NOT DETECTED   Listeria monocytogenes NOT DETECTED NOT DETECTED   Staphylococcus species DETECTED (A) NOT DETECTED   Staphylococcus aureus (BCID) NOT DETECTED NOT DETECTED   Staphylococcus epidermidis DETECTED (A) NOT DETECTED   Staphylococcus lugdunensis NOT DETECTED NOT DETECTED   Streptococcus species NOT DETECTED NOT DETECTED   Streptococcus agalactiae NOT DETECTED NOT DETECTED   Streptococcus pneumoniae NOT DETECTED NOT DETECTED   Streptococcus pyogenes NOT DETECTED NOT DETECTED   A.calcoaceticus-baumannii NOT DETECTED NOT DETECTED   Bacteroides fragilis NOT DETECTED NOT DETECTED   Enterobacterales NOT DETECTED NOT DETECTED   Enterobacter cloacae complex NOT DETECTED NOT DETECTED   Escherichia coli NOT DETECTED NOT DETECTED   Klebsiella aerogenes NOT DETECTED NOT DETECTED   Klebsiella oxytoca NOT DETECTED NOT DETECTED   Klebsiella  pneumoniae NOT DETECTED NOT DETECTED   Proteus species NOT DETECTED NOT DETECTED   Salmonella species NOT DETECTED NOT DETECTED   Serratia marcescens NOT DETECTED NOT DETECTED   Haemophilus influenzae NOT DETECTED NOT DETECTED   Neisseria meningitidis NOT DETECTED NOT DETECTED   Pseudomonas aeruginosa NOT DETECTED NOT DETECTED   Stenotrophomonas maltophilia NOT DETECTED NOT DETECTED   Candida albicans NOT DETECTED NOT DETECTED   Candida auris NOT DETECTED NOT DETECTED   Candida glabrata NOT DETECTED NOT DETECTED   Candida krusei NOT DETECTED NOT DETECTED   Candida parapsilosis NOT DETECTED NOT DETECTED   Candida tropicalis NOT DETECTED NOT DETECTED   Cryptococcus neoformans/gattii NOT DETECTED NOT DETECTED   Methicillin resistance mecA/C DETECTED (A) NOT DETECTED    Lynelle Doctor 03/26/2020  6:56 PM

## 2020-03-26 NOTE — Assessment & Plan Note (Signed)
Continue PPI ?

## 2020-03-26 NOTE — Assessment & Plan Note (Addendum)
-  patient has history of CKD3b. Baseline creat ~ 1.4 - 1.6, eGFR 48-50 - at baseline - s/p IVF in anticipation of CTA chest

## 2020-03-26 NOTE — Progress Notes (Signed)
Pharmacy Antibiotic Note  Tyrone Roman is a 75 y.o. male with COVID-19 in January and  recently hospitalized from 2/9-2/11 for PNA presented back to the ED  on 03/25/2020 with c/o generalized weakness. He was started on aztreonam and vancomycin on admission for PNA.  MRSA PCR came back negative with vancomycin d/ced on 2/19 AM and aztreonam changed to zosyn since pt tolerated unasyn/augmentin in the past.  He remains febrile. Micro lab reported on 2/19 that 1 of 4 blood cx bottles now has GPC (BCID = staph epi, mecA+). Pharmacy has been consulted to resume vancomycin back.   Plan: - vancomycin 1500mg  IV q24h for est AUC 419 - zosyn 3.375 gm IV q8h (infuse over 4 hrs) - daily scr ____________________________________________  Height: 6\' 2"  (188 cm) Weight: 92 kg (202 lb 13.2 oz) IBW/kg (Calculated) : 82.2  Temp (24hrs), Avg:100.5 F (38.1 C), Min:97.8 F (36.6 C), Max:102.8 F (39.3 C)  Recent Labs  Lab 03/25/20 1451 03/25/20 2038 03/26/20 0149 03/26/20 0847  WBC 5.9  --  4.9  --   CREATININE 1.46*  --  1.26*  --   LATICACIDVEN 2.3* 2.6*  --  1.3    Estimated Creatinine Clearance: 59.8 mL/min (A) (by C-G formula based on SCr of 1.26 mg/dL (H)).    Allergies  Allergen Reactions  . Cephalexin Shortness Of Breath, Rash and Other (See Comments)    Tolerated Augmentin 2015 and 2017 (12-2017). Tolerated nafcillin 12/2017 PATIENT HAS HAD A PCN REACTION WITH IMMEDIATE RASH, FACIAL/TONGUE/THROAT SWELLING, SOB, OR LIGHTHEADEDNESS WITH HYPOTENSION:  #  #  YES  #  #  Has patient had a PCN reaction causing severe rash involving mucus membranes or skin necrosis: No Has patient had a PCN reaction that required hospitalization: No Has patient had a PCN reaction occurring within the last 10 years: No If all of the above answers are "NO", then may proceed  . Clarithromycin Shortness Of Breath and Rash  . Certolizumab Pegol Hives  . Tocilizumab Hives  . Doxycycline Rash  . Hydroxyzine Rash   . Lidoderm Other (See Comments)    "made me act weird"  . Pyrithione Zinc Rash    Thank you for allowing pharmacy to be a part of this patient's care.  Lynelle Doctor 03/26/2020 6:41 PM

## 2020-03-26 NOTE — Progress Notes (Signed)
   03/26/20 0750  Assess: MEWS Score  Temp (!) 101.2 F (38.4 C)  BP (!) 147/88  Pulse Rate (!) 116  Resp (!) 21  SpO2 95 %  O2 Device Room Air  Assess: MEWS Score  MEWS Temp 1  MEWS Systolic 0  MEWS Pulse 2  MEWS RR 1  MEWS LOC 0  MEWS Score 4  MEWS Score Color Red  Assess: if the MEWS score is Yellow or Red  Were vital signs taken at a resting state? Yes  Focused Assessment No change from prior assessment  Early Detection of Sepsis Score *See Row Information* Medium  MEWS guidelines implemented *See Row Information* Yes  Treat  MEWS Interventions Administered prn meds/treatments  Pain Scale 0-10  Pain Score 0  Take Vital Signs  Increase Vital Sign Frequency  Red: Q 1hr X 4 then Q 4hr X 4, if remains red, continue Q 4hrs  Escalate  MEWS: Escalate Red: discuss with charge nurse/RN and provider, consider discussing with RRT  Notify: Charge Nurse/RN  Name of Charge Nurse/RN Notified Abby, RN  Date Charge Nurse/RN Notified 03/26/20  Time Charge Nurse/RN Notified 0827  Notify: Provider  Provider Name/Title Girguis  Date Provider Notified 03/26/20  Time Provider Notified 0800  Notification Type Page  Notification Reason Change in status  Provider response See new orders  Date of Provider Response 03/26/20  Time of Provider Response [phone]

## 2020-03-26 NOTE — Evaluation (Signed)
Occupational Therapy Evaluation Patient Details Name: Tyrone Roman MRN: [id number] DOB: [birth year] Today's Date: 03/26/2020    History of Present Illness BONIFACIO PRUDEN is a 75 y.o. male with medical history significant of CAD, HLD, HTN, RA, BPH, COPD , MI, recent Covid infection and Covid pneumonia diagnosis 02/29/20, hospitalized again 2/10-2/12, returns to hospital reporting since his discharge due to has not felt better has been progressively getting worse with fever, chills, cough and SOB.   Clinical Impression   Patient is currently requiring assistance with ADLs including minimal assist to min guard with toileting, LE dressing, and with bathing, and easily fatigues with decreased SpO2 to 84% with brief stand and pivot to recliner, all of which is below patient's typical baseline of being Independent prior to initial onset of Covid-19.  During this evaluation, patient was limited by poor activity tolerance, which has the potential to impact patient's safety and independence during functional mobility, as well as performance for ADLs. [place] "6-clicks" Daily Activity Inpatient Short Form score of 19/24 indicates 42.80% ADL impairment this session. Patient lives alone with limited/PRN assistance from 1 daughter who pt stated is often very busy with her family.  Patient demonstrates fair rehab potential, and should benefit from continued skilled occupational therapy services while in acute care to maximize safety, independence and quality of life at home.  Continued occupational therapy services in the home is recommended.  ?   Follow Up Recommendations  Home health OT;Supervision - Intermittent    Equipment Recommendations       Recommendations for Other Services       Precautions / Restrictions Precautions Precautions: Fall Precaution Comments: monitor O2, Air[place]/Contact Restrictions Weight Bearing Restrictions: No      Mobility Bed Mobility Overal bed  mobility: Needs Assistance Bed Mobility: Supine to Sit     Supine to sit: Min guard;HOB elevated     General bed mobility comments: elevated HOB, use of bedrail, slow to initiate requiring encouragement    Transfers Overall transfer level: Needs assistance Equipment used: None Transfers: Sit to/from [facility] Sit to Stand: Min guard Stand pivot transfers: Min guard       General transfer comment: BUE assisting to power up, upon rising, holds IV pole to steady self with pivot over to chair    Balance Overall balance assessment: Needs assistance Sitting-balance support: Feet supported Sitting balance-Leahy Scale: Good Sitting balance - Comments: seated EOB   Standing balance support: During functional activity Standing balance-Leahy Scale: Fair Standing balance comment: static standing without UE support, light touch on IV pole with pivot and sidesteps at bedside                           ADL either performed or assessed with clinical judgement   ADL Overall ADL's : Needs assistance/impaired Eating/Feeding: Independent   Grooming: Set up;Sitting Grooming Details (indicate cue type and reason): Desaturates with brief stand. Upper Body Bathing: Set up;Sitting   Lower Body Bathing: Minimal assistance;Sitting/lateral leans   Upper Body Dressing : Set up;Sitting   Lower Body Dressing: Minimal assistance;Sitting/lateral leans;Sit to/from stand;Cueing for compensatory techniques   Toilet Transfer: Magazine features editor Details (indicate cue type and reason): Pt verbalized goal to walk into bathroom, but pt desaturated as low as 84% on room air with a brief stand up and was assisted to recliner with good recovery to 91% in <1 min. Pt offered BSC, but declined. Min guard to  safely pivot to recliner. Toileting- Clothing Manipulation and Hygiene: Minimal assistance Toileting - Clothing Manipulation Details (indicate cue type and reason): Based on  general assessment.     Functional mobility during ADLs: Min guard       Vision Patient Visual Report: No change from baseline Vision Assessment?: No apparent visual deficits     Perception     Praxis      Pertinent Vitals/Pain Pain Assessment: No/denies pain     Hand Dominance Right   Extremity/Trunk Assessment Upper Extremity Assessment Upper Extremity Assessment: Overall WFL for tasks assessed   Lower Extremity Assessment Lower Extremity Assessment: Overall WFL for tasks assessed;Defer to PT evaluation   Cervical / Trunk Assessment Cervical / Trunk Assessment: Normal   Communication Communication Communication: No difficulties   Cognition Arousal/Alertness: Awake/alert Behavior During Therapy: WFL for tasks assessed/performed Overall Cognitive Status: Within Functional Limits for tasks assessed                                     General Comments  Desats to 94% on RA with standing and HR increase to 114. Pt symptomatic and fatigues quickly.  Recovered in <1 min of sitting with PLB to 91% and HR: 111.    Exercises     Shoulder Instructions      Home Living Family/patient expects to be discharged to:: Private residence Living Arrangements: Alone Available Help at Discharge: Family;Available PRN/intermittently Type of Home: House Home Access: Stairs to enter [employer] of Steps: 3 Entrance Stairs-Rails: Right;Left Home Layout: Two level;Able to live on main level with bedroom/bathroom Alternate Level Stairs-Number of Steps: flight Alternate Level Stairs-Rails: Right;Left;Can reach both Bathroom Shower/Tub: Occupational psychologist: Handicapped height     Home Equipment: Environmental consultant - 2 wheels;Cane - single point;Toilet riser;Shower seat          Prior Functioning/Environment Level of Independence: Independent        Comments: Prior to initial onset of Covid, pt reports being independent with community ambulation,  completes yardwork, independent with ADLs, drives. Pt has 2 daughters locally, not able to physically assist.        OT Problem List: Cardiopulmonary status limiting activity;Decreased activity tolerance;Decreased knowledge of precautions      OT Treatment/Interventions: Self-care/ADL training;Therapeutic exercise;Therapeutic activities;Energy conservation;DME and/or AE instruction;Patient/family education;Balance training    OT Goals(Current goals can be found in the care plan section) Acute Rehab OT Goals Patient Stated Goal: get back to normal OT Goal Formulation: With patient Time For Goal Achievement: 04/09/20 Potential to Achieve Goals: Fair ADL Goals Pt Will Perform Grooming: standing;with modified independence Pt Will Perform Lower Body Dressing: with adaptive equipment;sitting/lateral leans;sit to/from stand;with modified independence Pt Will Transfer to Toilet: with modified independence Pt Will Perform Toileting - Clothing Manipulation and hygiene: with modified independence Additional ADL Goal #1: Pt will engage in at least 5 min standing functional activities without loss of balance and with SpO2 at 90% or above, in order to demonstrate improved activity tolerance and balance needed to perform ADLs safely at home. Additional ADL Goal #2: Pt will identify at least 3 energy conservation techniques to avoid relapse and rehospitalization.  OT Frequency: Min 2X/week   Barriers to D/C: Decreased caregiver support  Lives alone       Co-evaluation PT/OT/SLP Co-Evaluation/Treatment: Yes Reason for Co-Treatment: To address functional/ADL transfers PT goals addressed during session: Mobility/safety with mobility;Balance OT goals addressed during  session: ADL's and self-care;Proper use of Adaptive equipment and DME      AM-PAC OT "6 Clicks" Daily Activity     Outcome Measure Help from another person eating meals?: None Help from another person taking care of personal  grooming?: A Little Help from another person toileting, which includes using toliet, bedpan, or urinal?: A Little Help from another person bathing (including washing, rinsing, drying)?: A Little Help from another person to put on and taking off regular upper body clothing?: A Little Help from another person to put on and taking off regular lower body clothing?: A Little 6 Click Score: 19   End of Session Equipment Utilized During Treatment: Gait belt Nurse Communication: Mobility status (SpO2 changes)  Activity Tolerance: Patient limited by fatigue;Other (comment) (Limited by symptomatic decreased SpO2) Patient left: in chair;with chair alarm set  OT Visit Diagnosis: History of falling (Z91.81);Unsteadiness on feet (R26.81) (decreased ADLs)                Time: 3159-4585 OT Time Calculation (min): 37 min Charges:  OT General Charges $OT Visit: 1 Visit OT Evaluation $OT Eval Low Complexity: 1 Low OT Treatments $Self Care/Home Management : 8-22 mins  Anderson Malta, [place] Office: [phone] 03/26/2020  Julien Girt 03/26/2020, 12:54 PM

## 2020-03-26 NOTE — Assessment & Plan Note (Signed)
-   likely combo of ACD and acute illness - no s/s bleeding - trend CBC while in hospital

## 2020-03-26 NOTE — Hospital Course (Addendum)
Tyrone Roman is a 75 yo male with PMH CAD, HTN, HLD, RA, BPH, COPD (not on chronic O2) and recent COVID-19 infection (02/29/20, s/p Remdesivir/steroids) followed by hospitalziation 2/10 - 2/12 for SOB/hypoxia. He has been relatively immobile at home due to ongoing shortness of breath and has not been doing much other than laying in bed.  He again presents back to the ER with his ongoing shortness of breath as well as fevers, chills, cough productive of brown sputum. He underwent CXR in the ER which was notable for bilateral opacities concerning for worsening pneumonia.  Lactic was mildly elevated, procalcitonin was negative.  He was saturating well on room air and did not require oxygen. He was placed on antibiotics and received IV fluids for presumed sepsis due to pneumonia. Repeat Covid PCR was again positive on 03/25/2020.  Due to ongoing oxygen demand, immobility at home, and worsening feeling of shortness of breath as well as elevated inflammatory markers, he underwent CTA chest on 03/26/2020.  Slight motion artifact however negative for PE.  Worsening groundglass opacities appreciated.  Blood cultures from admission were also positive for 1/4 bottles with MRSE. Cultures were re-drawn and he was continued on abx for coverage due to potential for decline.

## 2020-03-26 NOTE — Assessment & Plan Note (Signed)
-   no s/s exacerbation - continue routine care

## 2020-03-26 NOTE — Evaluation (Addendum)
Physical Therapy Evaluation Patient Details Name: Tyrone Roman MRN: [id number] DOB: [birth year] Today's Date: 03/26/2020   History of Present Illness  Tyrone Roman is a 75 y.o. male with medical history significant of CAD, HLD, HTN, RA, BPH, COPD , MI, recent Covid infection and Covid pneumonia diagnosis 02/29/20, hospitalized again 2/10-2/12, returns to hospital reporting since his discharge due to has not felt better has been progressively getting worse with fever, chills, cough and SOB.  Clinical Impression  Pt admitted with above diagnosis. Pt reports previously independent with all mobility, completes lawncare, drives, ADLs/IADLs independently, but since covid diagnosis in January 2022 pt fatigues easily and unable to ambulate and complete ADLs like normal. Pt appears slightly anxious with mobility, reports making the first move is hard. Pt able to come to EOB with encouragement and stand up, upon rising pt reaches for IV pole to steady self. Cued pt for standing marching, but pt fatigues with ~2 minutes of static standing and requests to turn sit in recliner. Pt desats on RA to 84% with moderate SOB noted. Pt able to recover to 91% within 1 min of seated rest on RA. HR max noted to be 114 with mobility. Pt motivated to ambulate around the room but too fatigued; tolerates remaining up in chair at EOS. Pt currently with functional limitations due to the deficits listed below (see PT Problem List). Pt will benefit from skilled PT to increase their independence and safety with mobility to allow discharge to the venue listed below.       Follow Up Recommendations Home health PT    Equipment Recommendations  None recommended by PT    Recommendations for Other Services       Precautions / Restrictions Precautions Precautions: Fall Precaution Comments: monitor O2 Restrictions Weight Bearing Restrictions: No      Mobility  Bed Mobility Overal bed mobility: Needs Assistance Bed Mobility:  Supine to Sit  Supine to sit: Min guard;HOB elevated  General bed mobility comments: elevated HOB, use of bedrail, slow to initiate requiring encouragement    Transfers Overall transfer level: Needs assistance Equipment used: None Transfers: Sit to/from [facility] Sit to Stand: Min guard Stand pivot transfers: Min guard  General transfer comment: BUE assisting to power up, upon rising, holds IV pole to steady self with pivot over to chair  Ambulation/Gait  General Gait Details: Pt stands for ~2 minutes with increased fatigue and SOB, unable to attempt standing marching due to fatigue, takes 3 sidesteps over to chair with IV pole steadying, no LOB but limited by SOB, desats to 84% and recovers to 91% within 1 minute of seated pursed lip breathing  Stairs            Wheelchair Mobility    Modified Rankin (Stroke Patients Only)       Balance Overall balance assessment: Needs assistance Sitting-balance support: Feet supported Sitting balance-Leahy Scale: Good Sitting balance - Comments: seated EOB   Standing balance support: During functional activity Standing balance-Leahy Scale: Fair Standing balance comment: static standing without UE support, light touch on IV pole with pivot and sidesteps at bedside        Pertinent Vitals/Pain Pain Assessment: No/denies pain    Home Living Family/patient expects to be discharged to:: Private residence Living Arrangements: Alone Available Help at Discharge: Family;Available PRN/intermittently Type of Home: House Home Access: Stairs to enter Entrance Stairs-Rails: Right;Left Entrance Stairs-Number of Steps: 3 Home Layout: Two level;Able to live on main level with bedroom/bathroom  Home Equipment: [place] - 2 wheels;Cane - single point;Toilet riser;Shower seat      Prior Function Level of Independence: Independent         Comments: Pt reports being independent with community ambulation, completes yardwork,  independent with ADLs, drives. Pt has 2 daughters locally, not able to physically assist.     Hand Dominance   Dominant Hand: Right    Extremity/Trunk Assessment   Upper Extremity Assessment Upper Extremity Assessment: Defer to OT evaluation    Lower Extremity Assessment Lower Extremity Assessment: Overall WFL for tasks assessed (AROM WNL, strength 4/5 throughout BLE, denies numbness/tingling)    Cervical / Trunk Assessment Cervical / Trunk Assessment: Normal  Communication   Communication: No difficulties  Cognition Arousal/Alertness: Awake/alert Behavior During Therapy: WFL for tasks assessed/performed Overall Cognitive Status: Within Functional Limits for tasks assessed    General Comments General comments (skin integrity, edema, etc.): desats to 86% on RA with mobility, recovers to 91% within 1 minute of sitting pursed lip breathing    Exercises     Assessment/Plan    PT Assessment Patient needs continued PT services  PT Problem List Decreased activity tolerance;Decreased balance;Decreased safety awareness;Cardiopulmonary status limiting activity       PT Treatment Interventions DME instruction;Gait training;Functional mobility training;Therapeutic activities;Therapeutic exercise;Balance training;Patient/family education    PT Goals (Current goals can be found in the Care Plan section)  Acute Rehab PT Goals Patient Stated Goal: get back to normal PT Goal Formulation: With patient Time For Goal Achievement: 04/09/20 Potential to Achieve Goals: Good    Frequency Min 3X/week   Barriers to discharge        Co-evaluation PT/OT/SLP Co-Evaluation/Treatment: Yes Reason for Co-Treatment: To address functional/ADL transfers PT goals addressed during session: Mobility/safety with mobility;Balance         AM-PAC PT "6 Clicks" Mobility  Outcome Measure Help needed turning from your back to your side while in a flat bed without using bedrails?: None Help needed  moving from lying on your back to sitting on the side of a flat bed without using bedrails?: None Help needed moving to and from a bed to a chair (including a wheelchair)?: A Little Help needed standing up from a chair using your arms (e.g., wheelchair or bedside chair)?: A Little Help needed to walk in hospital room?: A Little Help needed climbing 3-5 steps with a railing? : A Lot 6 Click Score: 19    End of Session Equipment Utilized During Treatment: Gait belt Activity Tolerance: Patient tolerated treatment well Patient left: in chair;with call bell/phone within reach;with chair alarm set;with nursing/sitter in room Nurse Communication: Mobility status PT Visit Diagnosis: Unsteadiness on feet (R26.81);Other abnormalities of gait and mobility (R26.89)    Time: 1018-1050 PT Time Calculation (min) (ACUTE ONLY): 32 min   Charges:   PT Evaluation $PT Eval Moderate Complexity: 1 Mod          Tori Chanah [place] PT, DPT 03/26/20, 12:34 PM

## 2020-03-27 DIAGNOSIS — R5381 Other malaise: Secondary | ICD-10-CM | POA: Diagnosis not present

## 2020-03-27 DIAGNOSIS — A419 Sepsis, unspecified organism: Secondary | ICD-10-CM | POA: Diagnosis not present

## 2020-03-27 DIAGNOSIS — D696 Thrombocytopenia, unspecified: Secondary | ICD-10-CM

## 2020-03-27 DIAGNOSIS — N1832 Chronic kidney disease, stage 3b: Secondary | ICD-10-CM

## 2020-03-27 DIAGNOSIS — U071 COVID-19: Secondary | ICD-10-CM | POA: Diagnosis not present

## 2020-03-27 LAB — CBC WITH DIFFERENTIAL/PLATELET
Abs Immature Granulocytes: 0.09 10*3/uL — ABNORMAL HIGH (ref 0.00–0.07)
Basophils Absolute: 0 10*3/uL (ref 0.0–0.1)
Basophils Relative: 1 %
Eosinophils Absolute: 0.2 10*3/uL (ref 0.0–0.5)
Eosinophils Relative: 5 %
HCT: 33.7 % — ABNORMAL LOW (ref 39.0–52.0)
Hemoglobin: 11.7 g/dL — ABNORMAL LOW (ref 13.0–17.0)
Immature Granulocytes: 2 %
Lymphocytes Relative: 6 %
Lymphs Abs: 0.2 10*3/uL — ABNORMAL LOW (ref 0.7–4.0)
MCH: 29.5 pg (ref 26.0–34.0)
MCHC: 34.7 g/dL (ref 30.0–36.0)
MCV: 85.1 fL (ref 80.0–100.0)
Monocytes Absolute: 0.2 10*3/uL (ref 0.1–1.0)
Monocytes Relative: 5 %
Neutro Abs: 3.5 10*3/uL (ref 1.7–7.7)
Neutrophils Relative %: 81 %
Platelets: 56 10*3/uL — ABNORMAL LOW (ref 150–400)
RBC: 3.96 MIL/uL — ABNORMAL LOW (ref 4.22–5.81)
RDW: 14.4 % (ref 11.5–15.5)
WBC: 4.3 10*3/uL (ref 4.0–10.5)
nRBC: 0.7 % — ABNORMAL HIGH (ref 0.0–0.2)

## 2020-03-27 LAB — URINE CULTURE

## 2020-03-27 LAB — D-DIMER, QUANTITATIVE: D-Dimer, Quant: 2.09 ug/mL-FEU — ABNORMAL HIGH (ref 0.00–0.50)

## 2020-03-27 LAB — BASIC METABOLIC PANEL
Anion gap: 8 (ref 5–15)
BUN: 15 mg/dL (ref 8–23)
CO2: 23 mmol/L (ref 22–32)
Calcium: 8.2 mg/dL — ABNORMAL LOW (ref 8.9–10.3)
Chloride: 101 mmol/L (ref 98–111)
Creatinine, Ser: 1.22 mg/dL (ref 0.61–1.24)
GFR, Estimated: >60 mL/min (ref >60)
Glucose, Bld: 94 mg/dL (ref 70–99)
Potassium: 3.9 mmol/L (ref 3.5–5.1)
Sodium: 132 mmol/L — ABNORMAL LOW (ref 135–145)

## 2020-03-27 LAB — PROCALCITONIN: Procalcitonin: <0.1 ng/mL

## 2020-03-27 LAB — C-REACTIVE PROTEIN: CRP: 18.6 mg/dL — ABNORMAL HIGH (ref <1.0)

## 2020-03-27 LAB — LACTATE DEHYDROGENASE: LDH: 337 U/L — ABNORMAL HIGH (ref 98–192)

## 2020-03-27 LAB — GLUCOSE, CAPILLARY: Glucose-Capillary: 85 mg/dL (ref 70–99)

## 2020-03-27 LAB — MAGNESIUM: Magnesium: 1.5 mg/dL — ABNORMAL LOW (ref 1.7–2.4)

## 2020-03-27 MED ORDER — ENSURE ENLIVE PO LIQD
237.0000 mL | Freq: Two times a day (BID) | ORAL | Status: DC
  Administered 2020-03-29 – 2020-04-01 (×8): 237 mL via ORAL

## 2020-03-27 MED ORDER — BOOST / RESOURCE BREEZE PO LIQD CUSTOM
1.0000 | ORAL | Status: DC
  Administered 2020-03-29: 1 via ORAL

## 2020-03-27 MED ORDER — PROSOURCE PLUS PO LIQD
30.0000 mL | Freq: Two times a day (BID) | ORAL | Status: DC
  Administered 2020-03-28 – 2020-04-01 (×6): 30 mL via ORAL
  Filled 2020-03-27 (×8): qty 30

## 2020-03-27 NOTE — Progress Notes (Signed)
Initial Nutrition Assessment  RD working remotely.  DOCUMENTATION CODES:   Not applicable  INTERVENTION:  - will order Boost Breeze once/day, each supplement provides 250 kcal and 9 grams of protein. - will order Ensure Enlive BID, each supplement provides 350 kcal and 20 grams of protein. - will order 30 ml Prosource Plus BID, each supplement provides 100 kcal and 15 grams protein.  - complete NFPE when feasible.    NUTRITION DIAGNOSIS:   Increased nutrient needs related to acute illness,catabolic illness (JJOAC-16 infection) as evidenced by estimated needs.  GOAL:   Patient will meet greater than or equal to 90% of their needs  MONITOR:   PO intake,Supplement acceptance,Labs,Weight trends  REASON FOR ASSESSMENT:   Malnutrition Screening Tool  ASSESSMENT:   75 yo male with medical history of CAD, HTN, HLD, RA, BPH, COPD (not on chronic O2) and recent COVID-19 infection (02/29/20, s/p Remdesivir/steroids) followed by hospitalziation 2/10 - 2/12 for SOB/hypoxia. Since discharge he has been minimally mobile d/t SOB. He returned to the ED due to SOB, fevers, chills, cough productive of brown sputum. CXR in the ED showed bilateral opacities concerning for worsening PNA.  Patient ate 100% of breakfast and 65% of lunch yesterday. He is noted to be out of the room to CT at this time.   Patient has not been seen by a [place] RD at any time in the past.   Weight today is 206 lb, weight yesterday was 203 lb, and weight on 2/18 was 198 lb. PTA the most recently documented weight was on 02/23/20 when he weighed 217 lb. This indicates 11 lb weight loss (5% body weight) in the past 1 month.   Non-pitting edema to RLE documented in the edema section of the flow sheet.   Per notes: - sepsis - PNA d/t COVID-19 - deconditioning - hx of stage 3 CKD   Labs reviewed; CBG: 85 mg/dl, Na: 132 mmol/l, Ca: 8.2 mg/dl, Mg: 1.5 mg/dl.  Medications reviewed; 40 mg oral protonix/day.     NUTRITION - FOCUSED PHYSICAL EXAM:  unable to complete at this time.   Diet Order:   Diet Order            Diet regular Room service appropriate? Yes; Fluid consistency: Thin  Diet effective now                 EDUCATION NEEDS:   Not appropriate for education at this time  Skin:  Skin Assessment: Reviewed RN Assessment  Last BM:  PTA/unknown  Height:   Ht Readings from Last 1 Encounters:  03/25/20 6\' 2"  (1.88 m)    Weight:   Wt Readings from Last 1 Encounters:  03/27/20 93.4 kg     Estimated Nutritional Needs:  Kcal:  2300-2500 kcal Protein:  120-135 grams Fluid:  >/= 3 L/day       Jarome Matin, MS, RD, LDN, CNSC Inpatient Clinical Dietitian RD pager # available in [facility]  After hours/weekend pager # available in [facility]

## 2020-03-27 NOTE — Progress Notes (Signed)
PROGRESS NOTE    Tyrone Roman   TJQ:[id number]  DOB: Mar 29, 1945  DOA: 03/25/2020     2  PCP: Tyrone Morale, MD  CC: SOB  Hospital Course: [name] is a 75 yo male with PMH CAD, HTN, HLD, RA, BPH, COPD (not on chronic O2) and recent COVID-19 infection (02/29/20, s/p Remdesivir/steroids) followed by hospitalziation 2/10 - 2/12 for SOB/hypoxia. He has been relatively immobile at home due to ongoing shortness of breath and has not been doing much other than laying in bed.  He again presents back to the ER with his ongoing shortness of breath as well as fevers, chills, cough productive of brown sputum. He underwent CXR in the ER which was notable for bilateral opacities concerning for worsening pneumonia.  Lactic was mildly elevated, procalcitonin was negative.  He was saturating well on room air and did not require oxygen. He was placed on antibiotics and received IV fluids for presumed sepsis due to pneumonia. Repeat Covid PCR was again positive on 03/25/2020.  Due to ongoing oxygen demand, immobility at home, and worsening feeling of shortness of breath as well as elevated inflammatory markers, he underwent CTA chest on 03/26/2020.  Slight motion artifact however negative for PE.  Worsening groundglass opacities appreciated.   Interval History:  No events overnight.  Resting in bed appearing a little more comfortable compared to yesterday.  Remains on room air.  Reviewed results of CTA chest with him.  Also reviewed blood culture findings and antibiotic plan as noted below.  Old records reviewed in assessment of this patient  ROS: Constitutional: positive for fatigue and malaise, Respiratory: positive for cough and dyspnea on exertion, Cardiovascular: negative for chest pain and Gastrointestinal: negative for abdominal pain  Assessment & Plan: * Sepsis ([place]) -Fever, tachycardia, tachypnea.  Source presumed lung at this time -Continue trending procalcitonin (negative x 2) -Initially  started on vancomycin and aztreonam.  MRSA swab is negative - was febrile most of 2/19, then admission blood culture grew 1/4 bottles with MRSE (likely contaminant but given frailty of patient, will continue appropriate coverage and repeat cultures). Hold off on echo for now - continue vanc and zosyn; monitor renal function  -Follow-up cultures  Pneumonia due to COVID-19 virus - currently on RA on admission; he is not hypoxic but he requested O2 for subjective comfort; we will remove O2 unless he meets criteria/becomes hypoxic - s/p treatment previously with remdesivir - continue breathing treatments, IS, and supportive care  -Given ongoing shortness of breath and DOE with ongoing symptoms, immobility, elevated dimer, will check CTA chest to evaluate for PE (negative for PE, shows worsening GGO) - given concern for superimposed bacterial infeciton, continue antibiotics   Physical deconditioning -Considered due to underlying recent COVID.  Patient has been very immobile at home resting mostly in bed -Follow-up PT eval: stable for HHPT/OT  Stage 3b chronic kidney disease ([place]) - patient has history of CKD3b. Baseline creat ~ 1.4 - 1.6, eGFR 48-50 - at baseline - gentle IVF in anticipation of CTA chest   Normocytic anemia - likely combo of ACD and acute illness - no s/s bleeding - trend CBC while in hospital   Thrombocytopenia ([place]) - chronically low; baseline appears 60-90k - no s/s bleeding - continue watching while inpatient - okay to continue DVT ppx  COPD (chronic obstructive pulmonary disease) (HCC) - no s/s exacerbation - continue routine care  BPH (benign prostatic hyperplasia) -Continue Flomax  GERD -Continue PPI  Coronary artery disease of  native artery of native heart with stable angina pectoris (HCC) - transient CP in ER that since resolved - flat trops (51>>31); no ischemic changes noted on EKG  Hyperlipidemia -Continue statin   Antimicrobials: Vanc  2/18>>current Zosyn 2/19>>current Aztreonam 2/18>>2/19  DVT prophylaxis: heparin injection 5,000 Units Start: 03/25/20 2200   Code Status:   Code Status: Full Code Family Communication: none present  Disposition Plan: Status is: Inpatient  Remains inpatient appropriate because:IV treatments appropriate due to intensity of illness or inability to take PO and Inpatient level of care appropriate due to severity of illness   Dispo: The patient is from: Home              Anticipated d/c is to: pending PT eval              Anticipated d/c date is: 3 days              Patient currently is not medically stable to d/c.   Difficult to place patient No  Risk of unplanned readmission score: Unplanned Admission- Pilot do not use: 26.53   Objective: Blood pressure 114/74, pulse (!) 111, temperature 100.2 F (37.9 C), temperature source Oral, resp. rate (!) 28, height _0  (1.88 m), weight 93.4 kg, SpO2 93 %.  Examination: General appearance: alert, cooperative, fatigued and no distress Head: Normocephalic, without obvious abnormality, atraumatic Eyes: EOMI Lungs: mild coarse sounds but relatively clear, no wheezing Heart: regular rate and rhythm and S1, S2 normal Abdomen: normal findings: bowel sounds normal and soft, non-tender Extremities: no edema;negative Homan's Skin: mobility and turgor normal Neurologic: Grossly normal  Consultants:   n/a  Procedures:   n/a  Data Reviewed: I have personally reviewed following labs and imaging studies Results for orders placed or performed during the hospital encounter of 03/25/20 (from the past 24 hour(s))  CBC with Differential/Platelet     Status: Abnormal   Collection Time: 03/27/20  4:55 AM  Result Value Ref Range   WBC 4.3 4.0 - 10.5 K/uL   RBC 3.96 (L) 4.22 - 5.81 MIL/uL   Hemoglobin 11.7 (L) 13.0 - 17.0 g/dL   HCT 33.7 (L) 39.0 - 52.0 %   MCV 85.1 80.0 - 100.0 fL   MCH 29.5 26.0 - 34.0 pg   MCHC 34.7 30.0 - 36.0 g/dL   RDW  14.4 11.5 - 15.5 %   Platelets 56 (L) 150 - 400 K/uL   nRBC 0.7 (H) 0.0 - 0.2 %   Neutrophils Relative % 81 %   Neutro Abs 3.5 1.7 - 7.7 K/uL   Lymphocytes Relative 6 %   Lymphs Abs 0.2 (L) 0.7 - 4.0 K/uL   Monocytes Relative 5 %   Monocytes Absolute 0.2 0.1 - 1.0 K/uL   Eosinophils Relative 5 %   Eosinophils Absolute 0.2 0.0 - 0.5 K/uL   Basophils Relative 1 %   Basophils Absolute 0.0 0.0 - 0.1 K/uL   Immature Granulocytes 2 %   Abs Immature Granulocytes 0.09 (H) 0.00 - 0.07 K/uL  C-reactive protein     Status: Abnormal   Collection Time: 03/27/20  4:55 AM  Result Value Ref Range   CRP 18.6 (H) <1.0 mg/dL  D-dimer, quantitative     Status: Abnormal   Collection Time: 03/27/20  4:55 AM  Result Value Ref Range   D-Dimer, Quant 2.09 (H) 0.00 - 0.50 ug/mL-FEU  Procalcitonin     Status: None   Collection Time: 03/27/20  4:55 AM  Result Value Ref  Range   Procalcitonin <0.10 ng/mL  Glucose, capillary     Status: None   Collection Time: 03/27/20  8:19 AM  Result Value Ref Range   Glucose-Capillary 85 70 - 99 mg/dL  Basic metabolic panel     Status: Abnormal   Collection Time: 03/27/20  9:23 AM  Result Value Ref Range   Sodium 132 (L) 135 - 145 mmol/L   Potassium 3.9 3.5 - 5.1 mmol/L   Chloride 101 98 - 111 mmol/L   CO2 23 22 - 32 mmol/L   Glucose, Bld 94 70 - 99 mg/dL   BUN 15 8 - 23 mg/dL   Creatinine, Ser 1.22 0.61 - 1.24 mg/dL   Calcium 8.2 (L) 8.9 - 10.3 mg/dL   GFR, Estimated >60 >60 mL/min   Anion gap 8 5 - 15  Lactate dehydrogenase     Status: Abnormal   Collection Time: 03/27/20  9:23 AM  Result Value Ref Range   LDH 337 (H) 98 - 192 U/L  Magnesium     Status: Abnormal   Collection Time: 03/27/20  9:23 AM  Result Value Ref Range   Magnesium 1.5 (L) 1.7 - 2.4 mg/dL    Recent Results (from the past 240 hour(s))  Urine culture     Status: Abnormal   Collection Time: 03/25/20  2:36 PM   Specimen: In/Out Cath Urine  Result Value Ref Range Status   Specimen  Description   Final    IN/OUT CATH URINE Performed at [facility], [place] [address] [place], [place] [address]    Special Requests   Final    NONE Performed at [facility], [place] [address], [place], [place] [address]    Culture MULTIPLE SPECIES PRESENT, SUGGEST RECOLLECTION (A)  Final   Report Status 03/27/2020 FINAL  Final  Blood Culture (routine x 2)     Status: None (Preliminary result)   Collection Time: 03/25/20  4:45 PM   Specimen: BLOOD LEFT HAND  Result Value Ref Range Status   Specimen Description   Final    BLOOD LEFT HAND Performed at [place] [address] [place], [place] [address]    Special Requests   Final    BOTTLES DRAWN AEROBIC AND ANAEROBIC Blood Culture adequate volume Performed at [facility] [place] [address] [place], [place] [address]    Culture   Final    NO GROWTH 2 DAYS Performed at [place] [address], [place], [place] [address]    Report Status PENDING  Incomplete  Blood Culture (routine x 2)     Status: Abnormal (Preliminary result)   Collection Time: 03/25/20  4:45 PM   Specimen: BLOOD LEFT FOREARM  Result Value Ref Range Status   Specimen Description   Final    BLOOD LEFT FOREARM Performed at [facility] [place] [address], [place], [place] [address]    Special Requests   Final    BOTTLES DRAWN AEROBIC AND ANAEROBIC Blood Culture adequate volume Performed at [place] [address], [place],  [address]    Culture  Setup Time   Final    GRAM POSITIVE COCCI IN CLUSTERS AEROBIC BOTTLE ONLY CRITICAL RESULT CALLED TO, READ BACK BY AND VERIFIED WITH: A PHAM PHARMD _0  03/26/20 EB    Culture (A)  Final    STAPHYLOCOCCUS HOMINIS THE SIGNIFICANCE OF ISOLATING THIS ORGANISM FROM A SINGLE SET OF BLOOD CULTURES WHEN MULTIPLE SETS  ARE DRAWN IS UNCERTAIN. PLEASE NOTIFY THE MICROBIOLOGY DEPARTMENT WITHIN ONE  WEEK IF SPECIATION AND SENSITIVITIES ARE REQUIRED. Performed at [facility], [place] [address] [place], [place] [address]    Report Status PENDING  Incomplete  Blood Culture ID Panel (Reflexed)     Status: Abnormal   Collection Time: 03/25/20  4:45 PM  Result Value Ref Range Status   Enterococcus faecalis NOT DETECTED NOT DETECTED Final   Enterococcus Faecium NOT DETECTED NOT DETECTED Final   Listeria monocytogenes NOT DETECTED NOT DETECTED Final   Staphylococcus species DETECTED (A) NOT DETECTED Final    Comment: CRITICAL RESULT CALLED TO, READ BACK BY AND VERIFIED WITH: A PHAM PHARMD _0  03/26/20 EB    Staphylococcus aureus (BCID) NOT DETECTED NOT DETECTED Final   Staphylococcus epidermidis DETECTED (A) NOT DETECTED Final    Comment: Methicillin (oxacillin) resistant coagulase negative staphylococcus. Possible blood culture contaminant (unless isolated from more than one blood culture draw or clinical case suggests pathogenicity). No antibiotic treatment is indicated for blood  culture contaminants. CRITICAL RESULT CALLED TO, READ BACK BY AND VERIFIED WITH: A PHAM PHARMD _1  03/26/20 EB    Staphylococcus lugdunensis NOT DETECTED NOT DETECTED Final   Streptococcus species NOT DETECTED NOT DETECTED Final   Streptococcus agalactiae NOT DETECTED NOT DETECTED Final   Streptococcus pneumoniae NOT DETECTED NOT DETECTED Final   Streptococcus pyogenes NOT DETECTED NOT DETECTED Final   A.calcoaceticus-baumannii NOT DETECTED NOT DETECTED Final   Bacteroides fragilis NOT DETECTED NOT DETECTED Final   Enterobacterales NOT DETECTED NOT DETECTED Final   Enterobacter cloacae complex NOT DETECTED NOT DETECTED Final   Escherichia coli NOT DETECTED NOT DETECTED Final   Klebsiella aerogenes NOT DETECTED NOT DETECTED Final   Klebsiella oxytoca NOT DETECTED NOT DETECTED Final   Klebsiella pneumoniae NOT DETECTED NOT DETECTED Final   Proteus species NOT DETECTED NOT DETECTED Final   Salmonella  species NOT DETECTED NOT DETECTED Final   Serratia marcescens NOT DETECTED NOT DETECTED Final   Haemophilus influenzae NOT DETECTED NOT DETECTED Final   Neisseria meningitidis NOT DETECTED NOT DETECTED Final   Pseudomonas aeruginosa NOT DETECTED NOT DETECTED Final   Stenotrophomonas maltophilia NOT DETECTED NOT DETECTED Final   Candida albicans NOT DETECTED NOT DETECTED Final   Candida auris NOT DETECTED NOT DETECTED Final   Candida glabrata NOT DETECTED NOT DETECTED Final   Candida krusei NOT DETECTED NOT DETECTED Final   Candida parapsilosis NOT DETECTED NOT DETECTED Final   Candida tropicalis NOT DETECTED NOT DETECTED Final   Cryptococcus neoformans/gattii NOT DETECTED NOT DETECTED Final   Methicillin resistance mecA/C DETECTED (A) NOT DETECTED Final    Comment: CRITICAL RESULT CALLED TO, READ BACK BY AND VERIFIED WITH: A PHAM PHARMD _2  03/26/20 EB Performed at [facility], [address], [place],  [address]   Resp Panel by RT-PCR (Flu A&B, Covid) Nasopharyngeal Swab     Status: Abnormal   Collection Time: 03/25/20  5:47 PM   Specimen: Nasopharyngeal Swab; Nasopharyngeal(NP) swabs in vial transport medium  Result Value Ref Range Status   SARS Coronavirus 2 by RT PCR POSITIVE (A) NEGATIVE Final    Comment: RESULT CALLED TO, READ BACK BY AND VERIFIED WITH: C SIMPSON AT 1852 ON 03/25/2020 BY JPM (NOTE) SARS-CoV-2 target nucleic acids are DETECTED.  The SARS-CoV-2 RNA is generally detectable in upper respiratory specimens during the acute phase of infection. Positive results are indicative of the presence of the identified virus, but do not rule out bacterial infection  or co-infection with other pathogens not detected by the test. Clinical correlation with patient history and other diagnostic information is necessary to determine patient infection status. The expected result is Negative.  Fact Sheet for Patients: EntrepreneurPulse.com.au  Fact  Sheet for Healthcare Providers: IncredibleEmployment.be  This test is not yet approved or cleared by the [place] FDA and  has been authorized for detection and/or diagnosis of SARS-CoV-2 by FDA under an Emergency Use Authorization (EUA).  This EUA will remain in effect (meaning this test ca n be used) for the duration of  the COVID-19 declaration under Section 564(b)(1) of the Act, 21 U.S.C. section 360bbb-3(b)(1), unless the authorization is terminated or revoked sooner.     Influenza A by PCR NEGATIVE NEGATIVE Final   Influenza B by PCR NEGATIVE NEGATIVE Final    Comment: (NOTE) The Xpert Xpress SARS-CoV-2/FLU/RSV plus assay is intended as an aid in the diagnosis of influenza from Nasopharyngeal swab specimens and should not be used as a sole basis for treatment. Nasal washings and aspirates are unacceptable for Xpert Xpress SARS-CoV-2/FLU/RSV testing.  Fact Sheet for Patients: EntrepreneurPulse.com.au  Fact Sheet for Healthcare Providers: IncredibleEmployment.be  This test is not yet approved or cleared by the [place] FDA and has been authorized for detection and/or diagnosis of SARS-CoV-2 by FDA under an Emergency Use Authorization (EUA). This EUA will remain in effect (meaning this test can be used) for the duration of the COVID-19 declaration under Section 564(b)(1) of the Act, 21 U.S.C. section 360bbb-3(b)(1), unless the authorization is terminated or revoked.  Performed at [facility], [place] [address] [place], [place] [address]   Culture, sputum-assessment     Status: None   Collection Time: 03/25/20  8:38 PM   Specimen: Expectorated Sputum  Result Value Ref Range Status   Specimen Description EXPECTORATED SPUTUM  Final   Special Requests NONE  Final   Sputum evaluation   Final    THIS SPECIMEN IS ACCEPTABLE FOR SPUTUM CULTURE Performed at [facility], [place]  [address] [place], [place] [address]    Report Status 03/25/2020 FINAL  Final  MRSA PCR Screening     Status: None   Collection Time: 03/25/20  8:38 PM   Specimen: Nasal Mucosa; Nasopharyngeal  Result Value Ref Range Status   MRSA by PCR NEGATIVE NEGATIVE Final    Comment:        The GeneXpert MRSA Assay (FDA approved for NASAL specimens only), is one component of a comprehensive MRSA colonization surveillance program. It is not intended to diagnose MRSA infection nor to guide or monitor treatment for MRSA infections. Performed at [facility], [place] [address] [place], [place] [address]   Culture, Respiratory w Gram Stain     Status: None (Preliminary result)   Collection Time: 03/25/20  8:38 PM  Result Value Ref Range Status   Specimen Description   Final    EXPECTORATED SPUTUM Performed at [place] [address] [place] [address], [place], [place] [address]    Special Requests   Final    NONE Reflexed from [id number] Performed at [place] [address] [place], [place] [address]    Gram Stain   Final    NO WBC SEEN FEW SQUAMOUS EPITHELIAL CELLS PRESENT ABUNDANT GRAM POSITIVE COCCI ABUNDANT GRAM NEGATIVE RODS MODERATE GRAM POSITIVE RODS    Culture   Final    CULTURE REINCUBATED FOR BETTER GROWTH Performed at [facility], [place]  [address] [place], [place] [address]    Report Status PENDING  Incomplete     Radiology Studies: CT ANGIO CHEST PE W OR WO CONTRAST  Result Date: 03/26/2020 CLINICAL DATA:  Shortness of breath.  COVID positive. EXAM: CT ANGIOGRAPHY CHEST WITH CONTRAST TECHNIQUE: Multidetector CT imaging of the chest was performed using the standard protocol during bolus administration of intravenous contrast. Multiplanar CT image reconstructions and MIPs were obtained to evaluate the vascular anatomy. CONTRAST:  154m OMNIPAQUE IOHEXOL 350 MG/ML SOLN COMPARISON:  03/16/2020 FINDINGS: Cardiovascular:  Examination is limited by respiratory motion artifact. Within that limitation, there is no central pulmonary embolus. The size of the main pulmonary artery is normal. Normal heart size with coronary artery calcification. The course and caliber of the aorta are normal. There is mild atherosclerotic calcification. Opacification decreased due to pulmonary arterial phase contrast bolus timing. Mediastinum/Nodes: -- No mediastinal lymphadenopathy. -- No hilar lymphadenopathy. -- No axillary lymphadenopathy. -- No supraclavicular lymphadenopathy. -- Normal thyroid gland where visualized. -  Unremarkable esophagus. Lungs/Pleura: There are trace bilateral pleural effusions. There are diffuse bilateral ground-glass airspace opacities. Emphysematous changes are suspected at the lung apices. There are pleural based calcified and noncalcified plaques. Overall, the airspace disease has worsened since the prior study. There is no pneumothorax. The trachea is unremarkable. There are trace bilateral pleural effusions. Upper Abdomen: Contrast bolus timing is not optimized for evaluation of the abdominal organs. The visualized portions of the organs of the upper abdomen are normal. Musculoskeletal: No chest wall abnormality. No bony spinal canal stenosis. Review of the MIP images confirms the above findings. IMPRESSION: 1. Examination is limited by respiratory motion artifact. Within that limitation, there is no central pulmonary embolus. 2. Worsening bilateral ground-glass airspace opacities, consistent with the patient's history of viral pneumonia. 3. Trace bilateral pleural effusions. 4. Calcified and noncalcified pleural plaques, consistent with prior asbestos exposure. Aortic Atherosclerosis (ICD10-I70.0) and Emphysema (ICD10-J43.9). Electronically Signed   By: CConstance HolsterM.D.   On: 03/26/2020 17:23   DG Chest Port 1 View  Result Date: 03/25/2020 CLINICAL DATA:  Possible sepsis.  Shortness of breath and confusion.  EXAM: PORTABLE CHEST 1 VIEW COMPARISON:  PA and lateral chest and CT chest 03/16/2020. FINDINGS: Patchy bilateral airspace disease persists and appears slightly worse on the left. No pneumothorax or pleural fluid. Heart size is normal. No acute or focal bony abnormality. IMPRESSION: Patchy bilateral airspace disease consistent with pneumonia appears slightly worse on the left compared to the prior exam Electronically Signed   By: TInge RiseM.D.   On: 03/25/2020 14:58   CT ANGIO CHEST PE W OR WO CONTRAST  Final Result    DG Chest Port 1 View  Final Result      Scheduled Meds: . benzonatate  100 mg Oral TID  . heparin  5,000 Units Subcutaneous Q8H  . metoprolol succinate  25 mg Oral Daily  . pantoprazole  40 mg Oral Daily  . simvastatin  40 mg Oral q1800  . sodium chloride flush  3 mL Intravenous Q12H  . sodium chloride flush  3 mL Intravenous Q12H  . tamsulosin  0.4 mg Oral Daily   PRN Meds: sodium chloride, acetaminophen **OR** [DISCONTINUED] acetaminophen, bisacodyl, hydrALAZINE, ipratropium, levalbuterol, morphine injection, nitroGLYCERIN, ondansetron **OR** ondansetron (ZOFRAN) IV, oxyCODONE, senna-docusate, sodium chloride flush, zolpidem Continuous Infusions: . sodium chloride    . piperacillin-tazobactam (ZOSYN)  IV 3.375 g (03/27/20 0855)  . vancomycin 1,500 mg (03/27/20 0853)     LOS: 2 days  Time  spent: Greater than 50% of the 35 minute visit was spent in counseling/coordination of care for the patient as laid out in the A&P.   Dwyane Dee, MD [facility] 03/27/2020, 2:50 PM

## 2020-03-27 NOTE — Progress Notes (Signed)
   03/27/20 1543  Assess: MEWS Score  Temp (!) 102.3 F (39.1 C)  BP 127/69  Pulse Rate (!) 122  Resp (!) 28  Level of Consciousness Alert  SpO2 90 %  O2 Device Room Air  Assess: MEWS Score  MEWS Temp 2  MEWS Systolic 0  MEWS Pulse 2  MEWS RR 2  MEWS LOC 0  MEWS Score 6  MEWS Score Color Red  Assess: if the MEWS score is Yellow or Red  Were vital signs taken at a resting state? Yes  Focused Assessment No change from prior assessment  Early Detection of Sepsis Score *See Row Information* Medium  MEWS guidelines implemented *See Row Information* Yes  Treat  MEWS Interventions Administered prn meds/treatments  Pain Scale 0-10  Pain Score 0  Take Vital Signs  Increase Vital Sign Frequency  Red: Q 1hr X 4 then Q 4hr X 4, if remains red, continue Q 4hrs  Escalate  MEWS: Escalate Red: discuss with charge nurse/RN and provider, consider discussing with RRT  Notify: Charge Nurse/RN  Name of Charge Nurse/RN Notified Brien Mates, RN  Date Charge Nurse/RN Notified 03/27/20  Time Charge Nurse/RN Notified 1545  Notify: Provider  Provider Name/Title [facility]  Date Provider Notified 03/27/20  Time Provider Notified 1545  Notification Type Page  Notification Reason Change in status  Provider response No new orders  Date of Provider Response 03/27/20  Time of Provider Response 1550

## 2020-03-27 NOTE — Plan of Care (Signed)
  Problem: Clinical Measurements: Goal: Signs and symptoms of infection will decrease Outcome: Progressing   Problem: Respiratory: Goal: Ability to maintain adequate ventilation will improve Outcome: Progressing   Problem: Activity: Goal: Risk for activity intolerance will decrease Outcome: Progressing   Problem: Pain Managment: Goal: General experience of comfort will improve Outcome: Progressing

## 2020-03-27 NOTE — Assessment & Plan Note (Signed)
-   chronically low; baseline appears 60-90k - no s/s bleeding - continue watching while inpatient - okay to continue DVT ppx

## 2020-03-28 DIAGNOSIS — R799 Abnormal finding of blood chemistry, unspecified: Secondary | ICD-10-CM | POA: Diagnosis not present

## 2020-03-28 DIAGNOSIS — U071 COVID-19: Secondary | ICD-10-CM | POA: Diagnosis not present

## 2020-03-28 DIAGNOSIS — R6520 Severe sepsis without septic shock: Secondary | ICD-10-CM | POA: Diagnosis not present

## 2020-03-28 DIAGNOSIS — A419 Sepsis, unspecified organism: Secondary | ICD-10-CM | POA: Diagnosis not present

## 2020-03-28 DIAGNOSIS — J9601 Acute respiratory failure with hypoxia: Secondary | ICD-10-CM

## 2020-03-28 LAB — CBC WITH DIFFERENTIAL/PLATELET
Abs Immature Granulocytes: 0.04 10*3/uL (ref 0.00–0.07)
Basophils Absolute: 0 10*3/uL (ref 0.0–0.1)
Basophils Relative: 1 %
Eosinophils Absolute: 0.1 10*3/uL (ref 0.0–0.5)
Eosinophils Relative: 4 %
HCT: 32.4 % — ABNORMAL LOW (ref 39.0–52.0)
Hemoglobin: 10.8 g/dL — ABNORMAL LOW (ref 13.0–17.0)
Immature Granulocytes: 1 %
Lymphocytes Relative: 11 %
Lymphs Abs: 0.4 10*3/uL — ABNORMAL LOW (ref 0.7–4.0)
MCH: 29 pg (ref 26.0–34.0)
MCHC: 33.3 g/dL (ref 30.0–36.0)
MCV: 86.9 fL (ref 80.0–100.0)
Monocytes Absolute: 0.1 10*3/uL (ref 0.1–1.0)
Monocytes Relative: 3 %
Neutro Abs: 2.7 10*3/uL (ref 1.7–7.7)
Neutrophils Relative %: 80 %
Platelets: 81 10*3/uL — ABNORMAL LOW (ref 150–400)
RBC: 3.73 MIL/uL — ABNORMAL LOW (ref 4.22–5.81)
RDW: 14.6 % (ref 11.5–15.5)
WBC: 3.4 10*3/uL — ABNORMAL LOW (ref 4.0–10.5)
nRBC: 0 % (ref 0.0–0.2)

## 2020-03-28 LAB — BASIC METABOLIC PANEL
Anion gap: 8 (ref 5–15)
BUN: 14 mg/dL (ref 8–23)
CO2: 21 mmol/L — ABNORMAL LOW (ref 22–32)
Calcium: 8.6 mg/dL — ABNORMAL LOW (ref 8.9–10.3)
Chloride: 102 mmol/L (ref 98–111)
Creatinine, Ser: 1.3 mg/dL — ABNORMAL HIGH (ref 0.61–1.24)
GFR, Estimated: 58 mL/min — ABNORMAL LOW (ref >60)
Glucose, Bld: 106 mg/dL — ABNORMAL HIGH (ref 70–99)
Potassium: 3.7 mmol/L (ref 3.5–5.1)
Sodium: 131 mmol/L — ABNORMAL LOW (ref 135–145)

## 2020-03-28 LAB — CULTURE, RESPIRATORY W GRAM STAIN
Culture: NORMAL
Gram Stain: NONE SEEN

## 2020-03-28 LAB — PROCALCITONIN: Procalcitonin: 0.11 ng/mL

## 2020-03-28 LAB — CULTURE, BLOOD (ROUTINE X 2): Special Requests: ADEQUATE

## 2020-03-28 LAB — LACTATE DEHYDROGENASE: LDH: 353 U/L — ABNORMAL HIGH (ref 98–192)

## 2020-03-28 LAB — GLUCOSE, CAPILLARY: Glucose-Capillary: 91 mg/dL (ref 70–99)

## 2020-03-28 LAB — C-REACTIVE PROTEIN: CRP: 18.5 mg/dL — ABNORMAL HIGH (ref <1.0)

## 2020-03-28 LAB — MAGNESIUM: Magnesium: 1.6 mg/dL — ABNORMAL LOW (ref 1.7–2.4)

## 2020-03-28 LAB — D-DIMER, QUANTITATIVE: D-Dimer, Quant: 1.99 ug/mL-FEU — ABNORMAL HIGH (ref 0.00–0.50)

## 2020-03-28 MED ORDER — MAGNESIUM OXIDE 400 (241.3 MG) MG PO TABS
800.0000 mg | ORAL_TABLET | Freq: Once | ORAL | Status: AC
  Administered 2020-03-28: 800 mg via ORAL
  Filled 2020-03-28: qty 2

## 2020-03-28 NOTE — Assessment & Plan Note (Addendum)
-   1/4 staph hominis from 2/18; likely contaminate - treated with Vanc initially; repeat cultures done on 2/20 remain negative also - no need for echo at this time

## 2020-03-28 NOTE — Assessment & Plan Note (Signed)
-   initially on RA on admission; has slowly started to become O2 dependent possibly from trial of IVF initially and atelectasis/poor inspiratory effort -Previously treated with remdesivir and steroids in January during Covid infection -Continue oxygen and wean as able -Given recent long steroid taper, holding off on further for now unless oxygen demand worsens

## 2020-03-28 NOTE — Care Management Important Message (Signed)
Important Message  Patient Details IM Letter placed in Patient's door caddy. Name: Tyrone Roman MRN: [id number] Date of Birth: [birth year]   Medicare Important Message Given:  Yes     Kerin Salen 03/28/2020, 11:45 AM

## 2020-03-28 NOTE — Progress Notes (Addendum)
PROGRESS NOTE    Tyrone Roman   PYP:[id number]  DOB: August 07, 1945  DOA: 03/25/2020     3  PCP: [name], MD  CC: SOB  Hospital Course: [name] is a 75 yo male with PMH CAD, HTN, HLD, RA, BPH, COPD (not on chronic O2) and recent COVID-19 infection (02/29/20, s/p Remdesivir/steroids) followed by hospitalziation 2/10 - 2/12 for SOB/hypoxia. He has been relatively immobile at home due to ongoing shortness of breath and has not been doing much other than laying in bed.  He again presents back to the ER with his ongoing shortness of breath as well as fevers, chills, cough productive of brown sputum. He underwent CXR in the ER which was notable for bilateral opacities concerning for worsening pneumonia.  Lactic was mildly elevated, procalcitonin was negative.  He was saturating well on room air and did not require oxygen. He was placed on antibiotics and received IV fluids for presumed sepsis due to pneumonia. Repeat Covid PCR was again positive on 03/25/2020.  Due to ongoing oxygen demand, immobility at home, and worsening feeling of shortness of breath as well as elevated inflammatory markers, he underwent CTA chest on 03/26/2020.  Slight motion artifact however negative for PE.  Worsening groundglass opacities appreciated.  Blood cultures from admission were also positive for 1/4 bottles with MRSE. Cultures were re-drawn and he was continued on abx for coverage due to potential for decline.    Interval History:  No events overnight.  Weak but comfortable.  At home he had episodes on occasion of incontinence of urine and stool.  States this morning that he was incontinent of stool as well again.  Not sure if he is trying to ask to get to the bathroom in time or not.  Old records reviewed in assessment of this patient  ROS: Constitutional: positive for fatigue and malaise, Respiratory: positive for cough and dyspnea on exertion, Cardiovascular: negative for chest pain and  Gastrointestinal: negative for abdominal pain  Assessment & Plan: * Severe sepsis (HCC) -Fever, tachycardia, tachypnea, lactic acidosis.  Source presumed lung at this time -Continue trending procalcitonin (negative x 2) -Initially started on vancomycin and aztreonam.  MRSA swab is negative - was febrile most of 2/19, then admission blood culture grew 1/4 bottles with MRSE (likely contaminant but given frailty of patient, will continue appropriate coverage and repeat cultures). Hold off on echo for now - culture has further speciated to [place], moreso a contaminate at this time; okay to d/c vanc (still noting having intermittent fever) - continue zosyn  -Follow-up cultures  Acute respiratory failure with hypoxia ([place]) - initially on RA on admission; has slowly started to become O2 dependent possibly from trial of IVF initially and atelectasis/poor inspiratory effort -Previously treated with remdesivir and steroids in January during Covid infection -Continue oxygen and wean as able -Given recent long steroid taper, holding off on further for now unless oxygen demand worsens  Pneumonia due to COVID-19 virus - currently on RA on admission; he is not hypoxic but he requested O2 for subjective comfort; we will remove O2 unless he meets criteria/becomes hypoxic - s/p treatment previously with remdesivir - continue breathing treatments, IS, and supportive care  -Given ongoing shortness of breath and DOE with ongoing symptoms, immobility, elevated dimer, will check CTA chest to evaluate for PE (negative for PE, shows worsening GGO) - given concern for superimposed bacterial infeciton, continue antibiotics   Physical deconditioning -Considered due to underlying recent COVID.  Patient has  been very immobile at home resting mostly in bed -Follow-up PT eval: stable for HHPT/OT  Stage 3b chronic kidney disease ([place]) - patient has history of CKD3b. Baseline creat ~ 1.4 - 1.6, eGFR 48-50 - at  baseline - s/p IVF in anticipation of CTA chest   Contamination of blood culture-resolved as of 03/28/2020 - 1/4 staph hominis from 2/18; likely contaminate - treated with Vanc initially; repeat cultures done on 2/20 remain negative also - no need for echo at this time   Thrombocytopenia (HCC) - chronically low; baseline appears 60-90k - no s/s bleeding - continue watching while inpatient - okay to continue DVT ppx  Normocytic anemia - likely combo of ACD and acute illness - no s/s bleeding - trend CBC while in hospital   COPD (chronic obstructive pulmonary disease) ([place]) - no s/s exacerbation - continue routine care  BPH (benign prostatic hyperplasia) -Continue Flomax  GERD -Continue PPI  Coronary artery disease of native artery of native heart with stable angina pectoris ([place]) - transient CP in ER that since resolved - flat trops (51>>31); no ischemic changes noted on EKG  Hyperlipidemia -Continue statin   Antimicrobials: Vanc 2/18>> 03/29/2019 Zosyn 2/19>>current Aztreonam 2/18>>2/19  DVT prophylaxis: heparin injection 5,000 Units Start: 03/25/20 2200   Code Status:   Code Status: Full Code Family Communication: none present  Disposition Plan: Status is: Inpatient  Remains inpatient appropriate because:IV treatments appropriate due to intensity of illness or inability to take PO and Inpatient level of care appropriate due to severity of illness   Dispo: The patient is from: Home              Anticipated d/c is to: pending PT eval: Home with [facility] for now (might change if patient gets weaker)              Anticipated d/c date is: 3 days              Patient currently is not medically stable to d/c.   Difficult to place patient No  Risk of unplanned readmission score: Unplanned Admission- Pilot do not use: 29.26   Objective: Blood pressure (!) 138/92, pulse (!) 104, temperature 100.2 F (37.9 C), temperature source Oral, resp. rate (!) 24, height '6\' 2"'   (1.88 m), weight 93.2 kg, SpO2 92 %.  Examination: General appearance: alert, cooperative, fatigued and no distress Head: Normocephalic, without obvious abnormality, atraumatic Eyes: EOMI Lungs: mild coarse sounds but relatively clear, no wheezing Heart: regular rate and rhythm and S1, S2 normal Abdomen: normal findings: bowel sounds normal and soft, non-tender Extremities: no edema;negative Homan's Skin: mobility and turgor normal Neurologic: Grossly normal  Pscyh: odd affect   Consultants:   n/a  Procedures:   n/a  Data Reviewed: I have personally reviewed following labs and imaging studies Results for orders placed or performed during the hospital encounter of 03/25/20 (from the past 24 hour(s))  Basic metabolic panel     Status: Abnormal   Collection Time: 03/28/20  4:25 AM  Result Value Ref Range   Sodium 131 (L) 135 - 145 mmol/L   Potassium 3.7 3.5 - 5.1 mmol/L   Chloride 102 98 - 111 mmol/L   CO2 21 (L) 22 - 32 mmol/L   Glucose, Bld 106 (H) 70 - 99 mg/dL   BUN 14 8 - 23 mg/dL   Creatinine, Ser 1.30 (H) 0.61 - 1.24 mg/dL   Calcium 8.6 (L) 8.9 - 10.3 mg/dL   GFR, Estimated 58 (L) >60 mL/min  Anion gap 8 5 - 15  CBC with Differential/Platelet     Status: Abnormal   Collection Time: 03/28/20  4:25 AM  Result Value Ref Range   WBC 3.4 (L) 4.0 - 10.5 K/uL   RBC 3.73 (L) 4.22 - 5.81 MIL/uL   Hemoglobin 10.8 (L) 13.0 - 17.0 g/dL   HCT 32.4 (L) 39.0 - 52.0 %   MCV 86.9 80.0 - 100.0 fL   MCH 29.0 26.0 - 34.0 pg   MCHC 33.3 30.0 - 36.0 g/dL   RDW 14.6 11.5 - 15.5 %   Platelets 81 (L) 150 - 400 K/uL   nRBC 0.0 0.0 - 0.2 %   Neutrophils Relative % 80 %   Neutro Abs 2.7 1.7 - 7.7 K/uL   Lymphocytes Relative 11 %   Lymphs Abs 0.4 (L) 0.7 - 4.0 K/uL   Monocytes Relative 3 %   Monocytes Absolute 0.1 0.1 - 1.0 K/uL   Eosinophils Relative 4 %   Eosinophils Absolute 0.1 0.0 - 0.5 K/uL   Basophils Relative 1 %   Basophils Absolute 0.0 0.0 - 0.1 K/uL   WBC Morphology MILD  LEFT SHIFT (1-5% METAS, OCC MYELO, OCC BANDS)    Immature Granulocytes 1 %   Abs Immature Granulocytes 0.04 0.00 - 0.07 K/uL  Magnesium     Status: Abnormal   Collection Time: 03/28/20  4:25 AM  Result Value Ref Range   Magnesium 1.6 (L) 1.7 - 2.4 mg/dL  C-reactive protein     Status: Abnormal   Collection Time: 03/28/20  4:25 AM  Result Value Ref Range   CRP 18.5 (H) <1.0 mg/dL  Lactate dehydrogenase     Status: Abnormal   Collection Time: 03/28/20  4:25 AM  Result Value Ref Range   LDH 353 (H) 98 - 192 U/L  D-dimer, quantitative     Status: Abnormal   Collection Time: 03/28/20  4:25 AM  Result Value Ref Range   D-Dimer, Quant 1.99 (H) 0.00 - 0.50 ug/mL-FEU  Procalcitonin     Status: None   Collection Time: 03/28/20  4:25 AM  Result Value Ref Range   Procalcitonin 0.11 ng/mL  Glucose, capillary     Status: None   Collection Time: 03/28/20  8:43 AM  Result Value Ref Range   Glucose-Capillary 91 70 - 99 mg/dL    Recent Results (from the past 240 hour(s))  Urine culture     Status: Abnormal   Collection Time: 03/25/20  2:36 PM   Specimen: In/Out Cath Urine  Result Value Ref Range Status   Specimen Description   Final    IN/OUT CATH URINE Performed at [facility], [place] [address] [place], [place] [address]    Special Requests   Final    NONE Performed at [facility], [place] [address] [place], [place] [address]    Culture MULTIPLE SPECIES PRESENT, SUGGEST RECOLLECTION (A)  Final   Report Status 03/27/2020 FINAL  Final  Blood Culture (routine x 2)     Status: None (Preliminary result)   Collection Time: 03/25/20  4:45 PM   Specimen: BLOOD LEFT HAND  Result Value Ref Range Status   Specimen Description   Final    BLOOD LEFT HAND Performed at [place] [facility] [address], [place], [place] [address]    Special Requests   Final    BOTTLES DRAWN AEROBIC AND ANAEROBIC Blood Culture adequate volume Performed at [facility]  [address] [place], [place] [address]    Culture   Final    NO GROWTH 3 DAYS Performed at [facility], [place] [address], [place], [place] [address]    Report Status PENDING  Incomplete  Blood Culture (routine x 2)     Status: Abnormal   Collection Time: 03/25/20  4:45 PM   Specimen: BLOOD LEFT FOREARM  Result Value Ref Range Status   Specimen Description   Final    BLOOD LEFT FOREARM Performed at [facility] [address] [place], [place] [address]    Special Requests   Final    BOTTLES DRAWN AEROBIC AND ANAEROBIC Blood Culture adequate volume Performed at [place] [address] [place], [place] [address]    Culture  Setup Time   Final    GRAM POSITIVE COCCI IN CLUSTERS AEROBIC BOTTLE ONLY CRITICAL RESULT CALLED TO, READ BACK BY AND VERIFIED WITH: A PHAM PHARMD '@[id number]'  03/26/20 EB    Culture (A)  Final    STAPHYLOCOCCUS HOMINIS THE SIGNIFICANCE OF ISOLATING THIS ORGANISM FROM A SINGLE SET OF BLOOD CULTURES WHEN MULTIPLE SETS ARE DRAWN IS UNCERTAIN. PLEASE NOTIFY THE MICROBIOLOGY DEPARTMENT WITHIN ONE WEEK IF SPECIATION AND SENSITIVITIES ARE REQUIRED. Performed at [facility], [place] [address] [place], [place] [address]    Report Status 03/28/2020 FINAL  Final  Blood Culture ID Panel (Reflexed)     Status: Abnormal   Collection Time: 03/25/20  4:45 PM  Result Value Ref Range Status   Enterococcus faecalis NOT DETECTED NOT DETECTED Final   Enterococcus Faecium NOT DETECTED NOT DETECTED Final   Listeria monocytogenes NOT DETECTED NOT DETECTED Final   Staphylococcus species DETECTED (A) NOT DETECTED Final    Comment: CRITICAL RESULT CALLED TO, READ BACK BY AND VERIFIED WITH: A PHAM PHARMD '@[id number]'  03/26/20 EB    Staphylococcus aureus (BCID) NOT DETECTED NOT DETECTED Final   Staphylococcus epidermidis DETECTED (A) NOT DETECTED Final    Comment: Methicillin (oxacillin) resistant coagulase negative  staphylococcus. Possible blood culture contaminant (unless isolated from more than one blood culture draw or clinical case suggests pathogenicity). No antibiotic treatment is indicated for blood  culture contaminants. CRITICAL RESULT CALLED TO, READ BACK BY AND VERIFIED WITH: A PHAM PHARMD '@[id number]'  03/26/20 EB    Staphylococcus lugdunensis NOT DETECTED NOT DETECTED Final   Streptococcus species NOT DETECTED NOT DETECTED Final   Streptococcus agalactiae NOT DETECTED NOT DETECTED Final   Streptococcus pneumoniae NOT DETECTED NOT DETECTED Final   Streptococcus pyogenes NOT DETECTED NOT DETECTED Final   A.calcoaceticus-baumannii NOT DETECTED NOT DETECTED Final   Bacteroides fragilis NOT DETECTED NOT DETECTED Final   Enterobacterales NOT DETECTED NOT DETECTED Final   Enterobacter cloacae complex NOT DETECTED NOT DETECTED Final   Escherichia coli NOT DETECTED NOT DETECTED Final   Klebsiella aerogenes NOT DETECTED NOT DETECTED Final   Klebsiella oxytoca NOT DETECTED NOT DETECTED Final   Klebsiella pneumoniae NOT DETECTED NOT DETECTED Final   Proteus species NOT DETECTED NOT DETECTED Final   Salmonella species NOT DETECTED NOT DETECTED Final   Serratia marcescens NOT DETECTED NOT DETECTED Final   Haemophilus influenzae NOT DETECTED NOT DETECTED Final   Neisseria meningitidis NOT DETECTED NOT DETECTED Final   Pseudomonas aeruginosa NOT DETECTED NOT DETECTED Final   Stenotrophomonas maltophilia NOT DETECTED NOT DETECTED Final   Candida albicans NOT DETECTED NOT DETECTED Final   Candida auris NOT DETECTED NOT DETECTED Final   Candida glabrata NOT DETECTED NOT DETECTED Final  Candida krusei NOT DETECTED NOT DETECTED Final   Candida parapsilosis NOT DETECTED NOT DETECTED Final   Candida tropicalis NOT DETECTED NOT DETECTED Final   Cryptococcus neoformans/gattii NOT DETECTED NOT DETECTED Final   Methicillin resistance mecA/C DETECTED (A) NOT DETECTED Final    Comment: CRITICAL RESULT CALLED TO, READ  BACK BY AND VERIFIED WITH: A PHAM PHARMD '@[id number]'  03/26/20 EB Performed at [facility], [address] [place], [place] [address]   Resp Panel by RT-PCR (Flu A&B, Covid) Nasopharyngeal Swab     Status: Abnormal   Collection Time: 03/25/20  5:47 PM   Specimen: Nasopharyngeal Swab; Nasopharyngeal(NP) swabs in vial transport medium  Result Value Ref Range Status   SARS Coronavirus 2 by RT PCR POSITIVE (A) NEGATIVE Final    Comment: RESULT CALLED TO, READ BACK BY AND VERIFIED WITH: C SIMPSON AT 1852 ON 03/25/2020 BY JPM (NOTE) SARS-CoV-2 target nucleic acids are DETECTED.  The SARS-CoV-2 RNA is generally detectable in upper respiratory specimens during the acute phase of infection. Positive results are indicative of the presence of the identified virus, but do not rule out bacterial infection or co-infection with other pathogens not detected by the test. Clinical correlation with patient history and other diagnostic information is necessary to determine patient infection status. The expected result is Negative.  Fact Sheet for Patients: EntrepreneurPulse.com.au  Fact Sheet for Healthcare Providers: IncredibleEmployment.be  This test is not yet approved or cleared by the [place] FDA and  has been authorized for detection and/or diagnosis of SARS-CoV-2 by FDA under an Emergency Use Authorization (EUA).  This EUA will remain in effect (meaning this test ca n be used) for the duration of  the COVID-19 declaration under Section 564(b)(1) of the Act, 21 U.S.C. section 360bbb-3(b)(1), unless the authorization is terminated or revoked sooner.     Influenza A by PCR NEGATIVE NEGATIVE Final   Influenza B by PCR NEGATIVE NEGATIVE Final    Comment: (NOTE) The Xpert Xpress SARS-CoV-2/FLU/RSV plus assay is intended as an aid in the diagnosis of influenza from Nasopharyngeal swab specimens and should not be used as a sole basis for treatment. Nasal  washings and aspirates are unacceptable for Xpert Xpress SARS-CoV-2/FLU/RSV testing.  Fact Sheet for Patients: EntrepreneurPulse.com.au  Fact Sheet for Healthcare Providers: IncredibleEmployment.be  This test is not yet approved or cleared by the [place] FDA and has been authorized for detection and/or diagnosis of SARS-CoV-2 by FDA under an Emergency Use Authorization (EUA). This EUA will remain in effect (meaning this test can be used) for the duration of the COVID-19 declaration under Section 564(b)(1) of the Act, 21 U.S.C. section 360bbb-3(b)(1), unless the authorization is terminated or revoked.  Performed at [facility], [place] [address] [place], [place] [address]   Culture, sputum-assessment     Status: None   Collection Time: 03/25/20  8:38 PM   Specimen: Expectorated Sputum  Result Value Ref Range Status   Specimen Description EXPECTORATED SPUTUM  Final   Special Requests NONE  Final   Sputum evaluation   Final    THIS SPECIMEN IS ACCEPTABLE FOR SPUTUM CULTURE Performed at [facility], [place] [address], [place], [place] [address]    Report Status 03/25/2020 FINAL  Final  MRSA PCR Screening     Status: None   Collection Time: 03/25/20  8:38 PM   Specimen: Nasal Mucosa; Nasopharyngeal  Result Value Ref Range Status   MRSA by PCR NEGATIVE NEGATIVE Final    Comment:  The GeneXpert MRSA Assay (FDA approved for NASAL specimens only), is one component of a comprehensive MRSA colonization surveillance program. It is not intended to diagnose MRSA infection nor to guide or monitor treatment for MRSA infections. Performed at [facility], [place] [address] [place], [place] [address]   Culture, Respiratory w Gram Stain     Status: None   Collection Time: 03/25/20  8:38 PM  Result Value Ref Range Status   Specimen Description   Final    EXPECTORATED SPUTUM Performed  at [facility] [address], [place], [place] [address]    Special Requests   Final    NONE Reflexed from [id number] Performed at [facility] [address] [place], [place] [address]    Gram Stain   Final    NO WBC SEEN FEW SQUAMOUS EPITHELIAL CELLS PRESENT ABUNDANT GRAM POSITIVE COCCI ABUNDANT GRAM NEGATIVE RODS MODERATE GRAM POSITIVE RODS    Culture   Final    ABUNDANT Consistent with normal respiratory flora. No Pseudomonas species isolated Performed at [facility] [address], [place], [place] [address]    Report Status 03/28/2020 FINAL  Final  Culture, blood (routine x 2)     Status: None (Preliminary result)   Collection Time: 03/27/20  9:23 AM   Specimen: BLOOD RIGHT HAND  Result Value Ref Range Status   Specimen Description   Final    BLOOD RIGHT HAND Performed at [facility], [place] [address] [place], [place] [address]    Special Requests   Final    BOTTLES DRAWN AEROBIC ONLY Blood Culture results may not be optimal due to an inadequate volume of blood received in culture bottles Performed at [facility] [address] [place], [place] [address]    Culture   Final    NO GROWTH < 24 HOURS Performed at [facility] [address] [place], [place] [address]    Report Status PENDING  Incomplete  Culture, blood (routine x 2)     Status: None (Preliminary result)   Collection Time: 03/27/20  9:23 AM   Specimen: BLOOD LEFT HAND  Result Value Ref Range Status   Specimen Description   Final    BLOOD LEFT HAND Performed at [facility], [place] [address], [place], [place] [address]    Special Requests   Final    BOTTLES DRAWN AEROBIC AND ANAEROBIC Blood Culture results may not be optimal due to an inadequate volume of blood received in culture bottles Performed at [facility] [address] [place], [place] [address]    Culture   Final    NO GROWTH < 24  HOURS Performed at [facility] [address], [place], [name] [address]    Report Status PENDING  Incomplete     Radiology Studies: CT ANGIO CHEST PE W OR WO CONTRAST  Result Date: 03/26/2020 CLINICAL DATA:  Shortness of breath.  COVID positive. EXAM: CT ANGIOGRAPHY CHEST WITH CONTRAST TECHNIQUE: Multidetector CT imaging of the chest was performed using the standard protocol during bolus administration of intravenous contrast. Multiplanar CT image reconstructions and MIPs were obtained to evaluate the vascular anatomy. CONTRAST:  129m OMNIPAQUE IOHEXOL 350 MG/ML SOLN COMPARISON:  03/16/2020 FINDINGS: Cardiovascular: Examination is limited by respiratory motion artifact. Within that limitation, there is no central pulmonary embolus. The size of the main pulmonary artery is normal. Normal heart size with coronary artery calcification.  The course and caliber of the aorta are normal. There is mild atherosclerotic calcification. Opacification decreased due to pulmonary arterial phase contrast bolus timing. Mediastinum/Nodes: -- No mediastinal lymphadenopathy. -- No hilar lymphadenopathy. -- No axillary lymphadenopathy. -- No supraclavicular lymphadenopathy. -- Normal thyroid gland where visualized. -  Unremarkable esophagus. Lungs/Pleura: There are trace bilateral pleural effusions. There are diffuse bilateral ground-glass airspace opacities. Emphysematous changes are suspected at the lung apices. There are pleural based calcified and noncalcified plaques. Overall, the airspace disease has worsened since the prior study. There is no pneumothorax. The trachea is unremarkable. There are trace bilateral pleural effusions. Upper Abdomen: Contrast bolus timing is not optimized for evaluation of the abdominal organs. The visualized portions of the organs of the upper abdomen are normal. Musculoskeletal: No chest wall abnormality. No bony spinal canal stenosis. Review of the MIP images confirms the above  findings. IMPRESSION: 1. Examination is limited by respiratory motion artifact. Within that limitation, there is no central pulmonary embolus. 2. Worsening bilateral ground-glass airspace opacities, consistent with the patient's history of viral pneumonia. 3. Trace bilateral pleural effusions. 4. Calcified and noncalcified pleural plaques, consistent with prior asbestos exposure. Aortic Atherosclerosis (ICD10-I70.0) and Emphysema (ICD10-J43.9). Electronically Signed   By: Constance Holster M.D.   On: 03/26/2020 17:23   CT ANGIO CHEST PE W OR WO CONTRAST  Final Result    DG Chest [facility] 1 View  Final Result      Scheduled Meds: . (feeding supplement) PROSource Plus  30 mL Oral BID BM  . benzonatate  100 mg Oral TID  . feeding supplement  1 Container Oral Q24H  . feeding supplement  237 mL Oral BID BM  . heparin  5,000 Units Subcutaneous Q8H  . metoprolol succinate  25 mg Oral Daily  . pantoprazole  40 mg Oral Daily  . simvastatin  40 mg Oral q1800  . sodium chloride flush  3 mL Intravenous Q12H  . sodium chloride flush  3 mL Intravenous Q12H  . tamsulosin  0.4 mg Oral Daily   PRN Meds: sodium chloride, acetaminophen **OR** [DISCONTINUED] acetaminophen, bisacodyl, hydrALAZINE, ipratropium, levalbuterol, morphine injection, nitroGLYCERIN, ondansetron **OR** ondansetron (ZOFRAN) IV, oxyCODONE, senna-docusate, sodium chloride flush, zolpidem Continuous Infusions: . sodium chloride    . piperacillin-tazobactam (ZOSYN)  IV 3.375 g (03/28/20 1141)     LOS: 3 days  Time spent: Greater than 50% of the 35 minute visit was spent in counseling/coordination of care for the patient as laid out in the A&P.   Dwyane Dee, MD [facility] 03/28/2020, 4:27 PM

## 2020-03-28 NOTE — Plan of Care (Signed)
Problem: Fluid Volume: Goal: Hemodynamic stability will improve 03/28/2020 2323 by Micheline Chapman, RN Outcome: Progressing 03/28/2020 2322 by Micheline Chapman, RN Outcome: Progressing   Problem: Clinical Measurements: Goal: Diagnostic test results will improve 03/28/2020 2323 by Micheline Chapman, RN Outcome: Progressing 03/28/2020 2322 by Micheline Chapman, RN Outcome: Progressing Goal: Signs and symptoms of infection will decrease 03/28/2020 2323 by Micheline Chapman, RN Outcome: Progressing 03/28/2020 2322 by Micheline Chapman, RN Outcome: Progressing   Problem: Respiratory: Goal: Ability to maintain adequate ventilation will improve 03/28/2020 2323 by Micheline Chapman, RN Outcome: Progressing 03/28/2020 2322 by Micheline Chapman, RN Outcome: Progressing   Problem: Education: Goal: Knowledge of General Education information will improve Description: Including pain rating scale, medication(s)/side effects and non-pharmacologic comfort measures 03/28/2020 2323 by Micheline Chapman, RN Outcome: Progressing 03/28/2020 2322 by Micheline Chapman, RN Outcome: Progressing   Problem: Health Behavior/Discharge Planning: Goal: Ability to manage health-related needs will improve 03/28/2020 2323 by Micheline Chapman, RN Outcome: Progressing 03/28/2020 2322 by Micheline Chapman, RN Outcome: Progressing   Problem: Clinical Measurements: Goal: Ability to maintain clinical measurements within normal limits will improve 03/28/2020 2323 by Micheline Chapman, RN Outcome: Progressing 03/28/2020 2322 by Micheline Chapman, RN Outcome: Progressing Goal: Will remain free from infection 03/28/2020 2323 by Micheline Chapman, RN Outcome: Progressing 03/28/2020 2322 by Micheline Chapman, RN Outcome: Progressing Goal: Diagnostic test results will improve 03/28/2020 2323 by Micheline Chapman, RN Outcome: Progressing 03/28/2020 2322 by Micheline Chapman, RN Outcome: Progressing Goal: Respiratory complications will improve 03/28/2020  2323 by Micheline Chapman, RN Outcome: Progressing 03/28/2020 2322 by Micheline Chapman, RN Outcome: Progressing Goal: Cardiovascular complication will be avoided 03/28/2020 2323 by Micheline Chapman, RN Outcome: Progressing 03/28/2020 2322 by Micheline Chapman, RN Outcome: Progressing   Problem: Activity: Goal: Risk for activity intolerance will decrease 03/28/2020 2323 by Micheline Chapman, RN Outcome: Progressing 03/28/2020 2322 by Micheline Chapman, RN Outcome: Progressing   Problem: Nutrition: Goal: Adequate nutrition will be maintained 03/28/2020 2323 by Micheline Chapman, RN Outcome: Progressing 03/28/2020 2322 by Micheline Chapman, RN Outcome: Progressing   Problem: Coping: Goal: Level of anxiety will decrease 03/28/2020 2323 by Micheline Chapman, RN Outcome: Progressing 03/28/2020 2322 by Micheline Chapman, RN Outcome: Progressing   Problem: Elimination: Goal: Will not experience complications related to bowel motility 03/28/2020 2323 by Micheline Chapman, RN Outcome: Progressing 03/28/2020 2322 by Micheline Chapman, RN Outcome: Progressing Goal: Will not experience complications related to urinary retention 03/28/2020 2323 by Micheline Chapman, RN Outcome: Progressing 03/28/2020 2322 by Micheline Chapman, RN Outcome: Progressing   Problem: Pain Managment: Goal: General experience of comfort will improve 03/28/2020 2323 by Micheline Chapman, RN Outcome: Progressing 03/28/2020 2322 by Micheline Chapman, RN Outcome: Progressing   Problem: Safety: Goal: Ability to remain free from injury will improve 03/28/2020 2323 by Micheline Chapman, RN Outcome: Progressing 03/28/2020 2322 by Micheline Chapman, RN Outcome: Progressing   Problem: Skin Integrity: Goal: Risk for impaired skin integrity will decrease 03/28/2020 2323 by Micheline Chapman, RN Outcome: Progressing 03/28/2020 2322 by Micheline Chapman, RN Outcome: Progressing   Problem: Education: Goal: Knowledge of risk factors and measures for prevention  of condition will improve Outcome: Progressing   Problem: Coping: Goal: Psychosocial and spiritual needs will be supported Outcome: Progressing   Problem: Respiratory: Goal: Will maintain a patent airway Outcome: Progressing Goal: Complications related to the  disease process, condition or treatment will be avoided or minimized Outcome: Progressing

## 2020-03-28 NOTE — Plan of Care (Signed)

## 2020-03-29 DIAGNOSIS — J9601 Acute respiratory failure with hypoxia: Secondary | ICD-10-CM | POA: Diagnosis not present

## 2020-03-29 DIAGNOSIS — A419 Sepsis, unspecified organism: Secondary | ICD-10-CM | POA: Diagnosis not present

## 2020-03-29 DIAGNOSIS — E785 Hyperlipidemia, unspecified: Secondary | ICD-10-CM | POA: Diagnosis not present

## 2020-03-29 DIAGNOSIS — J439 Emphysema, unspecified: Secondary | ICD-10-CM | POA: Diagnosis not present

## 2020-03-29 LAB — CBC WITH DIFFERENTIAL/PLATELET
Abs Immature Granulocytes: 0.03 10*3/uL (ref 0.00–0.07)
Basophils Absolute: 0 10*3/uL (ref 0.0–0.1)
Basophils Relative: 0 %
Eosinophils Absolute: 0.1 10*3/uL (ref 0.0–0.5)
Eosinophils Relative: 2 %
HCT: 34.1 % — ABNORMAL LOW (ref 39.0–52.0)
Hemoglobin: 11.2 g/dL — ABNORMAL LOW (ref 13.0–17.0)
Immature Granulocytes: 1 %
Lymphocytes Relative: 14 %
Lymphs Abs: 0.4 10*3/uL — ABNORMAL LOW (ref 0.7–4.0)
MCH: 29.1 pg (ref 26.0–34.0)
MCHC: 32.8 g/dL (ref 30.0–36.0)
MCV: 88.6 fL (ref 80.0–100.0)
Monocytes Absolute: 0.2 10*3/uL (ref 0.1–1.0)
Monocytes Relative: 6 %
Neutro Abs: 2.4 10*3/uL (ref 1.7–7.7)
Neutrophils Relative %: 77 %
Platelets: 99 10*3/uL — ABNORMAL LOW (ref 150–400)
RBC: 3.85 MIL/uL — ABNORMAL LOW (ref 4.22–5.81)
RDW: 14.9 % (ref 11.5–15.5)
WBC: 3.1 10*3/uL — ABNORMAL LOW (ref 4.0–10.5)
nRBC: 0 % (ref 0.0–0.2)

## 2020-03-29 LAB — BASIC METABOLIC PANEL
Anion gap: 9 (ref 5–15)
BUN: 17 mg/dL (ref 8–23)
CO2: 22 mmol/L (ref 22–32)
Calcium: 9 mg/dL (ref 8.9–10.3)
Chloride: 102 mmol/L (ref 98–111)
Creatinine, Ser: 1.32 mg/dL — ABNORMAL HIGH (ref 0.61–1.24)
GFR, Estimated: 57 mL/min — ABNORMAL LOW (ref >60)
Glucose, Bld: 97 mg/dL (ref 70–99)
Potassium: 3.7 mmol/L (ref 3.5–5.1)
Sodium: 133 mmol/L — ABNORMAL LOW (ref 135–145)

## 2020-03-29 LAB — LACTATE DEHYDROGENASE: LDH: 377 U/L — ABNORMAL HIGH (ref 98–192)

## 2020-03-29 LAB — GLUCOSE, CAPILLARY: Glucose-Capillary: 92 mg/dL (ref 70–99)

## 2020-03-29 LAB — MAGNESIUM: Magnesium: 1.7 mg/dL (ref 1.7–2.4)

## 2020-03-29 LAB — D-DIMER, QUANTITATIVE: D-Dimer, Quant: 2.05 ug/mL-FEU — ABNORMAL HIGH (ref 0.00–0.50)

## 2020-03-29 LAB — C-REACTIVE PROTEIN: CRP: 16.3 mg/dL — ABNORMAL HIGH (ref <1.0)

## 2020-03-29 NOTE — Plan of Care (Signed)
  Problem: Fluid Volume: Goal: Hemodynamic stability will improve Outcome: Progressing   Problem: Clinical Measurements: Goal: Diagnostic test results will improve Outcome: Progressing Goal: Signs and symptoms of infection will decrease Outcome: Progressing   Problem: Respiratory: Goal: Ability to maintain adequate ventilation will improve Outcome: Progressing   Problem: Education: Goal: Knowledge of General Education information will improve Description: Including pain rating scale, medication(s)/side effects and non-pharmacologic comfort measures Outcome: Progressing   Problem: Health Behavior/Discharge Planning: Goal: Ability to manage health-related needs will improve Outcome: Progressing   Problem: Clinical Measurements: Goal: Ability to maintain clinical measurements within normal limits will improve Outcome: Progressing Goal: Will remain free from infection Outcome: Progressing Goal: Diagnostic test results will improve Outcome: Progressing Goal: Respiratory complications will improve Outcome: Progressing Goal: Cardiovascular complication will be avoided Outcome: Progressing   Problem: Activity: Goal: Risk for activity intolerance will decrease Outcome: Progressing   Problem: Nutrition: Goal: Adequate nutrition will be maintained Outcome: Progressing   Problem: Coping: Goal: Level of anxiety will decrease Outcome: Progressing   Problem: Elimination: Goal: Will not experience complications related to bowel motility Outcome: Progressing Goal: Will not experience complications related to urinary retention Outcome: Progressing   Problem: Pain Managment: Goal: General experience of comfort will improve Outcome: Progressing   Problem: Safety: Goal: Ability to remain free from injury will improve Outcome: Progressing   Problem: Skin Integrity: Goal: Risk for impaired skin integrity will decrease Outcome: Progressing   Problem: Education: Goal:  Knowledge of risk factors and measures for prevention of condition will improve Outcome: Progressing   Problem: Coping: Goal: Psychosocial and spiritual needs will be supported Outcome: Progressing   Problem: Respiratory: Goal: Will maintain a patent airway Outcome: Progressing Goal: Complications related to the disease process, condition or treatment will be avoided or minimized Outcome: Progressing

## 2020-03-29 NOTE — Telephone Encounter (Signed)
Lft VM to rtn call. Dm/cma  

## 2020-03-29 NOTE — Progress Notes (Addendum)
PROGRESS NOTE    Tyrone Roman   RJJ:[id number]  DOB: April 10, 1945  DOA: 03/25/2020     4  PCP: [name], MD  CC: SOB  Hospital Course: [name] is a 75 yo male with PMH CAD, HTN, HLD, RA, BPH, COPD (not on chronic O2) and recent COVID-19 infection (02/29/20, s/p Remdesivir/steroids) followed by hospitalziation 2/10 - 2/12 for SOB/hypoxia. He has been relatively immobile at home due to ongoing shortness of breath and has not been doing much other than laying in bed.  He again presents back to the ER with his ongoing shortness of breath as well as fevers, chills, cough productive of brown sputum. He underwent CXR in the ER which was notable for bilateral opacities concerning for worsening pneumonia.  Lactic was mildly elevated, procalcitonin was negative. He was saturating well on room air and did not require oxygen. He was placed on antibiotics and received IV fluids for presumed sepsis due to pneumonia. Repeat Covid PCR was again positive on 03/25/2020. Due to ongoing oxygen demand, immobility at home, and worsening feeling of shortness of breath as well as elevated inflammatory markers, he underwent CTA chest on 03/26/2020.  Slight motion artifact however negative for PE.  Worsening groundglass opacities appreciated. Blood cultures from admission were also positive for 1/4 bottles with MRSE. Cultures were re-drawn and he was continued on abx for coverage due to potential for decline.   Interval History:  Patient noted to have loose stools, denies any abdominal pain, N/V, still SOB   Assessment & Plan: Severe sepsis Fever, tachycardia, tachypnea, lactic acidosis.  Source presumed lung at this time Procalcitonin (negative x 2) Blood culture grew 1/4 bottles with MRSE, repeat culture has further speciated to S[place], moreso a contaminant, another repeat BC X 2 with NGTD Continue zosyn  Acute respiratory failure with hypoxia Intermittently requiring O2 Previously treated with  remdesivir and steroids in January during Covid infection Continue oxygen and wean as able Given recent long steroid taper, holding off on further unless oxygen demand worsens Supplemental O2 as needed  Pneumonia due to COVID-19 virus s/p treatment previously with remdesivir Continue breathing treatments, IS, and supportive care  CTA chest negative for PE, shows worsening groundglass opacity Given concern for superimposed bacterial infeciton, continue antibiotics   Diarrhea Loose, not watery ?antibiotic related GI panel pending Monitor closely  Physical deconditioning Considered due to underlying recent COVID PT eval, rec HHPT/OT  Stage 3b chronic kidney disease Baseline creat ~ 1.4 - 1.6, eGFR 48-50  Thrombocytopenia  Baseline appears 60-90k Daily CBC  COPD Continue routine care  BPH Continue Flomax  GERD Continue PPI  Coronary artery disease of native artery of native heart with stable angina pectoris (HCC) Currently chest pain free  Hyperlipidemia Continue statin    Antimicrobials: Vanc 2/18>> 03/29/2019 Zosyn 2/19>>current Aztreonam 2/18>>2/19  DVT prophylaxis: heparin injection 5,000 Units Start: 03/25/20 2200   Code Status:   Code Status: Full Code Family Communication: none present  Disposition Plan: Status is: Inpatient  Remains inpatient appropriate because:IV treatments appropriate due to intensity of illness or inability to take PO and Inpatient level of care appropriate due to severity of illness   Dispo: The patient is from: Home              Anticipated d/c is to: pending PT eval: Home with [facility] for now (might change if patient gets weaker)              Anticipated d/c date is:  3 days              Patient currently is not medically stable to d/c.   Difficult to place patient No    Objective: Blood pressure 106/68, pulse (!) 110, temperature 100.3 F (37.9 C), temperature source Oral, resp. rate 16, height '6\' 2"'  (1.88 m), weight  91 kg, SpO2 90 %.   Examination:  General: NAD, deconditioned   Cardiovascular: S1, S2 present  Respiratory: Coarse BS bilaterally  Abdomen: Soft, nontender, nondistended, bowel sounds present  Musculoskeletal: No bilateral pedal edema noted  Skin: Normal  Psychiatry: Fair mood   Consultants:   n/a  Procedures:   n/a  Data Reviewed: I have personally reviewed following labs and imaging studies Results for orders placed or performed during the hospital encounter of 03/25/20 (from the past 24 hour(s))  Basic metabolic panel     Status: Abnormal   Collection Time: 03/29/20  4:39 AM  Result Value Ref Range   Sodium 133 (L) 135 - 145 mmol/L   Potassium 3.7 3.5 - 5.1 mmol/L   Chloride 102 98 - 111 mmol/L   CO2 22 22 - 32 mmol/L   Glucose, Bld 97 70 - 99 mg/dL   BUN 17 8 - 23 mg/dL   Creatinine, Ser 1.32 (H) 0.61 - 1.24 mg/dL   Calcium 9.0 8.9 - 10.3 mg/dL   GFR, Estimated 57 (L) >60 mL/min   Anion gap 9 5 - 15  CBC with Differential/Platelet     Status: Abnormal   Collection Time: 03/29/20  4:39 AM  Result Value Ref Range   WBC 3.1 (L) 4.0 - 10.5 K/uL   RBC 3.85 (L) 4.22 - 5.81 MIL/uL   Hemoglobin 11.2 (L) 13.0 - 17.0 g/dL   HCT 34.1 (L) 39.0 - 52.0 %   MCV 88.6 80.0 - 100.0 fL   MCH 29.1 26.0 - 34.0 pg   MCHC 32.8 30.0 - 36.0 g/dL   RDW 14.9 11.5 - 15.5 %   Platelets 99 (L) 150 - 400 K/uL   nRBC 0.0 0.0 - 0.2 %   Neutrophils Relative % 77 %   Neutro Abs 2.4 1.7 - 7.7 K/uL   Lymphocytes Relative 14 %   Lymphs Abs 0.4 (L) 0.7 - 4.0 K/uL   Monocytes Relative 6 %   Monocytes Absolute 0.2 0.1 - 1.0 K/uL   Eosinophils Relative 2 %   Eosinophils Absolute 0.1 0.0 - 0.5 K/uL   Basophils Relative 0 %   Basophils Absolute 0.0 0.0 - 0.1 K/uL   Immature Granulocytes 1 %   Abs Immature Granulocytes 0.03 0.00 - 0.07 K/uL  Magnesium     Status: None   Collection Time: 03/29/20  4:39 AM  Result Value Ref Range   Magnesium 1.7 1.7 - 2.4 mg/dL  C-reactive protein      Status: Abnormal   Collection Time: 03/29/20  4:39 AM  Result Value Ref Range   CRP 16.3 (H) <1.0 mg/dL  Lactate dehydrogenase     Status: Abnormal   Collection Time: 03/29/20  4:39 AM  Result Value Ref Range   LDH 377 (H) 98 - 192 U/L  D-dimer, quantitative     Status: Abnormal   Collection Time: 03/29/20  4:39 AM  Result Value Ref Range   D-Dimer, Quant 2.05 (H) 0.00 - 0.50 ug/mL-FEU  Glucose, capillary     Status: None   Collection Time: 03/29/20  7:45 AM  Result Value Ref Range   Glucose-Capillary 92 70 -  99 mg/dL    Recent Results (from the past 240 hour(s))  Urine culture     Status: Abnormal   Collection Time: 03/25/20  2:36 PM   Specimen: In/Out Cath Urine  Result Value Ref Range Status   Specimen Description   Final    IN/OUT CATH URINE Performed at [place] [address] [place], [place] [address]    Special Requests   Final    NONE Performed at [facility], [place] [address] [place], [place] [address]    Culture MULTIPLE SPECIES PRESENT, SUGGEST RECOLLECTION (A)  Final   Report Status 03/27/2020 FINAL  Final  Blood Culture (routine x 2)     Status: None (Preliminary result)   Collection Time: 03/25/20  4:45 PM   Specimen: BLOOD LEFT HAND  Result Value Ref Range Status   Specimen Description   Final    BLOOD LEFT HAND Performed at [place] [address], [place], [place] [address]    Special Requests   Final    BOTTLES DRAWN AEROBIC AND ANAEROBIC Blood Culture adequate volume Performed at [place] [address] [place], [place] [address]    Culture   Final    NO GROWTH 4 DAYS Performed at [facility], [place] [address] [place], [place] [address]    Report Status PENDING  Incomplete  Blood Culture (routine x 2)     Status: Abnormal   Collection Time: 03/25/20  4:45 PM   Specimen: BLOOD LEFT FOREARM  Result Value Ref Range Status   Specimen Description    Final    BLOOD LEFT FOREARM Performed at [place] [address] [place], [place] [address]    Special Requests   Final    BOTTLES DRAWN AEROBIC AND ANAEROBIC Blood Culture adequate volume Performed at [place] [address] [place], [place] [address]    Culture  Setup Time   Final    GRAM POSITIVE COCCI IN CLUSTERS AEROBIC BOTTLE ONLY CRITICAL RESULT CALLED TO, READ BACK BY AND VERIFIED WITH: A PHAM PHARMD '@[id number]'  03/26/20 EB    Culture (A)  Final    STAPHYLOCOCCUS HOMINIS THE SIGNIFICANCE OF ISOLATING THIS ORGANISM FROM A SINGLE SET OF BLOOD CULTURES WHEN MULTIPLE SETS ARE DRAWN IS UNCERTAIN. PLEASE NOTIFY THE MICROBIOLOGY DEPARTMENT WITHIN ONE WEEK IF SPECIATION AND SENSITIVITIES ARE REQUIRED. Performed at [facility], [place] [address] [place], [place] [address]    Report Status 03/28/2020 FINAL  Final  Blood Culture ID Panel (Reflexed)     Status: Abnormal   Collection Time: 03/25/20  4:45 PM  Result Value Ref Range Status   Enterococcus faecalis NOT DETECTED NOT DETECTED Final   Enterococcus Faecium NOT DETECTED NOT DETECTED Final   Listeria monocytogenes NOT DETECTED NOT DETECTED Final   Staphylococcus species DETECTED (A) NOT DETECTED Final    Comment: CRITICAL RESULT CALLED TO, READ BACK BY AND VERIFIED WITH: A PHAM PHARMD '@[id number]'  03/26/20 EB    Staphylococcus aureus (BCID) NOT DETECTED NOT DETECTED Final   Staphylococcus epidermidis DETECTED (A) NOT DETECTED Final    Comment: Methicillin (oxacillin) resistant coagulase negative staphylococcus. Possible blood culture contaminant (unless isolated from more than one blood culture draw or clinical case suggests pathogenicity). No antibiotic treatment is indicated for blood  culture contaminants. CRITICAL RESULT CALLED TO, READ BACK BY AND VERIFIED WITH: A PHAM PHARMD '@[id number]'  03/26/20 EB    Staphylococcus lugdunensis NOT DETECTED  NOT DETECTED Final   Streptococcus species NOT  DETECTED NOT DETECTED Final   Streptococcus agalactiae NOT DETECTED NOT DETECTED Final   Streptococcus pneumoniae NOT DETECTED NOT DETECTED Final   Streptococcus pyogenes NOT DETECTED NOT DETECTED Final   A.calcoaceticus-baumannii NOT DETECTED NOT DETECTED Final   Bacteroides fragilis NOT DETECTED NOT DETECTED Final   Enterobacterales NOT DETECTED NOT DETECTED Final   Enterobacter cloacae complex NOT DETECTED NOT DETECTED Final   Escherichia coli NOT DETECTED NOT DETECTED Final   Klebsiella aerogenes NOT DETECTED NOT DETECTED Final   Klebsiella oxytoca NOT DETECTED NOT DETECTED Final   Klebsiella pneumoniae NOT DETECTED NOT DETECTED Final   Proteus species NOT DETECTED NOT DETECTED Final   Salmonella species NOT DETECTED NOT DETECTED Final   Serratia marcescens NOT DETECTED NOT DETECTED Final   Haemophilus influenzae NOT DETECTED NOT DETECTED Final   Neisseria meningitidis NOT DETECTED NOT DETECTED Final   Pseudomonas aeruginosa NOT DETECTED NOT DETECTED Final   Stenotrophomonas maltophilia NOT DETECTED NOT DETECTED Final   Candida albicans NOT DETECTED NOT DETECTED Final   Candida auris NOT DETECTED NOT DETECTED Final   Candida glabrata NOT DETECTED NOT DETECTED Final   Candida krusei NOT DETECTED NOT DETECTED Final   Candida parapsilosis NOT DETECTED NOT DETECTED Final   Candida tropicalis NOT DETECTED NOT DETECTED Final   Cryptococcus neoformans/gattii NOT DETECTED NOT DETECTED Final   Methicillin resistance mecA/C DETECTED (A) NOT DETECTED Final    Comment: CRITICAL RESULT CALLED TO, READ BACK BY AND VERIFIED WITH: A PHAM PHARMD '@[id number]'  03/26/20 EB Performed at [facility], [address] [place], [place] [address]   Resp Panel by RT-PCR (Flu A&B, Covid) Nasopharyngeal Swab     Status: Abnormal   Collection Time: 03/25/20  5:47 PM   Specimen: Nasopharyngeal Swab; Nasopharyngeal(NP) swabs in vial transport medium  Result Value Ref Range Status   SARS Coronavirus 2 by RT PCR  POSITIVE (A) NEGATIVE Final    Comment: RESULT CALLED TO, READ BACK BY AND VERIFIED WITH: C SIMPSON AT 1852 ON 03/25/2020 BY JPM (NOTE) SARS-CoV-2 target nucleic acids are DETECTED.  The SARS-CoV-2 RNA is generally detectable in upper respiratory specimens during the acute phase of infection. Positive results are indicative of the presence of the identified virus, but do not rule out bacterial infection or co-infection with other pathogens not detected by the test. Clinical correlation with patient history and other diagnostic information is necessary to determine patient infection status. The expected result is Negative.  Fact Sheet for Patients: EntrepreneurPulse.com.au  Fact Sheet for Healthcare Providers: IncredibleEmployment.be  This test is not yet approved or cleared by the [place] FDA and  has been authorized for detection and/or diagnosis of SARS-CoV-2 by FDA under an Emergency Use Authorization (EUA).  This EUA will remain in effect (meaning this test ca n be used) for the duration of  the COVID-19 declaration under Section 564(b)(1) of the Act, 21 U.S.C. section 360bbb-3(b)(1), unless the authorization is terminated or revoked sooner.     Influenza A by PCR NEGATIVE NEGATIVE Final   Influenza B by PCR NEGATIVE NEGATIVE Final    Comment: (NOTE) The Xpert Xpress SARS-CoV-2/FLU/RSV plus assay is intended as an aid in the diagnosis of influenza from Nasopharyngeal swab specimens and should not be used as a sole basis for treatment. Nasal washings and aspirates are unacceptable for Xpert Xpress SARS-CoV-2/FLU/RSV testing.  Fact Sheet for Patients: EntrepreneurPulse.com.au  Fact Sheet for Healthcare Providers: IncredibleEmployment.be  This test is not  yet approved or cleared by the [place] and has been authorized for detection and/or diagnosis of SARS-CoV-2 by FDA under an Emergency  Use Authorization (EUA). This EUA will remain in effect (meaning this test can be used) for the duration of the COVID-19 declaration under Section 564(b)(1) of the Act, 21 U.S.C. section 360bbb-3(b)(1), unless the authorization is terminated or revoked.  Performed at [facility], [place] [address] [place], [place] [address]   Culture, sputum-assessment     Status: None   Collection Time: 03/25/20  8:38 PM   Specimen: Expectorated Sputum  Result Value Ref Range Status   Specimen Description EXPECTORATED SPUTUM  Final   Special Requests NONE  Final   Sputum evaluation   Final    THIS SPECIMEN IS ACCEPTABLE FOR SPUTUM CULTURE Performed at [facility], [place] [address], [place], [place] [address]    Report Status 03/25/2020 FINAL  Final  MRSA PCR Screening     Status: None   Collection Time: 03/25/20  8:38 PM   Specimen: Nasal Mucosa; Nasopharyngeal  Result Value Ref Range Status   MRSA by PCR NEGATIVE NEGATIVE Final    Comment:        The GeneXpert MRSA Assay (FDA approved for NASAL specimens only), is one component of a comprehensive MRSA colonization surveillance program. It is not intended to diagnose MRSA infection nor to guide or monitor treatment for MRSA infections. Performed at [facility], [place] [address] [place], [place] [address]   Culture, Respiratory w Gram Stain     Status: None   Collection Time: 03/25/20  8:38 PM  Result Value Ref Range Status   Specimen Description   Final    EXPECTORATED SPUTUM Performed at [place] [address], [place], [place] [address]    Special Requests   Final    NONE Reflexed from [id number] Performed at [place] [address], [place], [place] [address]    Gram Stain   Final    NO WBC SEEN FEW SQUAMOUS EPITHELIAL CELLS PRESENT ABUNDANT GRAM POSITIVE COCCI ABUNDANT GRAM NEGATIVE RODS MODERATE GRAM POSITIVE RODS    Culture    Final    ABUNDANT Consistent with normal respiratory flora. No Pseudomonas species isolated Performed at [place] [address], [place], [place] [address]    Report Status 03/28/2020 FINAL  Final  Culture, blood (routine x 2)     Status: None (Preliminary result)   Collection Time: 03/27/20  9:23 AM   Specimen: BLOOD RIGHT HAND  Result Value Ref Range Status   Specimen Description   Final    BLOOD RIGHT HAND Performed at [facility], [place] [address], [place], [place] [address]    Special Requests   Final    BOTTLES DRAWN AEROBIC ONLY Blood Culture results may not be optimal due to an inadequate volume of blood received in culture bottles Performed at [place] [address] [place],  [address]    Culture   Final    NO GROWTH 2 DAYS Performed at [place] [address] [place],  [address]    Report Status PENDING  Incomplete  Culture, blood (routine x 2)     Status: None (Preliminary result)   Collection Time: 03/27/20  9:23 AM   Specimen: BLOOD LEFT HAND  Result Value Ref Range Status   Specimen Description   Final    BLOOD  LEFT HAND Performed at [facility], [place] [address], [place], [place] [address]    Special Requests   Final    BOTTLES DRAWN AEROBIC AND ANAEROBIC Blood Culture results may not be optimal due to an inadequate volume of blood received in culture bottles Performed at [facility] [address] [place], [place] [address]    Culture   Final    NO GROWTH 2 DAYS Performed at [place] [address], [place], [place] [address]    Report Status PENDING  Incomplete     Radiology Studies: No results found. CT ANGIO CHEST PE W OR WO CONTRAST  Final Result    DG Chest [facility] [address]  Final Result      Scheduled Meds: . (feeding supplement) PROSource Plus  30 mL Oral BID BM  . benzonatate  100 mg Oral TID  . feeding supplement  1 Container Oral Q24H   . feeding supplement  237 mL Oral BID BM  . heparin  5,000 Units Subcutaneous Q8H  . metoprolol succinate  25 mg Oral Daily  . pantoprazole  40 mg Oral Daily  . simvastatin  40 mg Oral q1800  . sodium chloride flush  3 mL Intravenous Q12H  . sodium chloride flush  3 mL Intravenous Q12H  . tamsulosin  0.4 mg Oral Daily   PRN Meds: sodium chloride, acetaminophen **OR** [DISCONTINUED] acetaminophen, bisacodyl, hydrALAZINE, ipratropium, levalbuterol, morphine injection, nitroGLYCERIN, ondansetron **OR** ondansetron (ZOFRAN) IV, oxyCODONE, senna-docusate, sodium chloride flush, zolpidem Continuous Infusions: . sodium chloride    . piperacillin-tazobactam (ZOSYN)  IV 3.375 g (03/29/20 1701)     LOS: 4 days    Alma Friendly, MD [facility] 03/29/2020, 5:14 PM

## 2020-03-29 NOTE — Progress Notes (Signed)
Pt changed and stool sent for GI panel as ordered, back felt warm, Temp 101, will give Tylendol for temp. SRP, RN

## 2020-03-29 NOTE — Plan of Care (Signed)
  Problem: Clinical Measurements: Goal: Diagnostic test results will improve Outcome: Progressing Goal: Signs and symptoms of infection will decrease Outcome: Progressing

## 2020-03-29 NOTE — TOC Progression Note (Signed)
Transition of Care [facility]) - Progression Note    Patient Details  Name: ZACHARIA SOWLES MRN: [id number] Date of Birth: Apr 06, 1945  [facility] CM/SW Contact  Purcell Mouton, RN Phone Number: 03/29/2020, 2:51 PM  Clinical Narrative:    Spoke with pt's daughter [name] [phone] concerning pt's discharge plans. Daughter states pt lives in a two story house, alone and this is his 4th admission to the hospital.  PT recommended HHPT. Pt also declined [facility] on prior admission. [name] is aware. Will ask PT to see pt again. TOC will continue to follow.         Expected Discharge Plan and Services                                                 Social Determinants of Health (SDOH) Interventions    Readmission Risk Interventions No flowsheet data found.

## 2020-03-30 ENCOUNTER — Inpatient Hospital Stay: Payer: Medicare Other | Admitting: Hematology and Oncology

## 2020-03-30 ENCOUNTER — Inpatient Hospital Stay: Payer: Medicare Other

## 2020-03-30 ENCOUNTER — Inpatient Hospital Stay (HOSPITAL_COMMUNITY): Payer: Medicare Other

## 2020-03-30 DIAGNOSIS — A419 Sepsis, unspecified organism: Secondary | ICD-10-CM | POA: Diagnosis not present

## 2020-03-30 DIAGNOSIS — J439 Emphysema, unspecified: Secondary | ICD-10-CM | POA: Diagnosis not present

## 2020-03-30 DIAGNOSIS — R6520 Severe sepsis without septic shock: Secondary | ICD-10-CM

## 2020-03-30 DIAGNOSIS — I9589 Other hypotension: Secondary | ICD-10-CM

## 2020-03-30 DIAGNOSIS — I25118 Atherosclerotic heart disease of native coronary artery with other forms of angina pectoris: Secondary | ICD-10-CM | POA: Diagnosis not present

## 2020-03-30 DIAGNOSIS — J9601 Acute respiratory failure with hypoxia: Secondary | ICD-10-CM | POA: Diagnosis not present

## 2020-03-30 LAB — CBC WITH DIFFERENTIAL/PLATELET
Abs Immature Granulocytes: 0.05 10*3/uL (ref 0.00–0.07)
Basophils Absolute: 0 10*3/uL (ref 0.0–0.1)
Basophils Relative: 1 %
Eosinophils Absolute: 0.1 10*3/uL (ref 0.0–0.5)
Eosinophils Relative: 3 %
HCT: 34.6 % — ABNORMAL LOW (ref 39.0–52.0)
Hemoglobin: 11.7 g/dL — ABNORMAL LOW (ref 13.0–17.0)
Immature Granulocytes: 1 %
Lymphocytes Relative: 11 %
Lymphs Abs: 0.4 10*3/uL — ABNORMAL LOW (ref 0.7–4.0)
MCH: 29.3 pg (ref 26.0–34.0)
MCHC: 33.8 g/dL (ref 30.0–36.0)
MCV: 86.7 fL (ref 80.0–100.0)
Monocytes Absolute: 0.1 10*3/uL (ref 0.1–1.0)
Monocytes Relative: 4 %
Neutro Abs: 2.9 10*3/uL (ref 1.7–7.7)
Neutrophils Relative %: 80 %
Platelets: 128 10*3/uL — ABNORMAL LOW (ref 150–400)
RBC: 3.99 MIL/uL — ABNORMAL LOW (ref 4.22–5.81)
RDW: 14.8 % (ref 11.5–15.5)
WBC: 3.7 10*3/uL — ABNORMAL LOW (ref 4.0–10.5)
nRBC: 0 % (ref 0.0–0.2)

## 2020-03-30 LAB — BASIC METABOLIC PANEL
Anion gap: 10 (ref 5–15)
BUN: 20 mg/dL (ref 8–23)
CO2: 24 mmol/L (ref 22–32)
Calcium: 9 mg/dL (ref 8.9–10.3)
Chloride: 103 mmol/L (ref 98–111)
Creatinine, Ser: 1.22 mg/dL (ref 0.61–1.24)
GFR, Estimated: >60 mL/min (ref >60)
Glucose, Bld: 115 mg/dL — ABNORMAL HIGH (ref 70–99)
Potassium: 4.5 mmol/L (ref 3.5–5.1)
Sodium: 137 mmol/L (ref 135–145)

## 2020-03-30 LAB — CULTURE, BLOOD (ROUTINE X 2)
Culture: NO GROWTH
Special Requests: ADEQUATE

## 2020-03-30 LAB — GLUCOSE, CAPILLARY: Glucose-Capillary: 99 mg/dL (ref 70–99)

## 2020-03-30 LAB — LACTATE DEHYDROGENASE: LDH: 392 U/L — ABNORMAL HIGH (ref 98–192)

## 2020-03-30 LAB — D-DIMER, QUANTITATIVE: D-Dimer, Quant: 2.17 ug/mL-FEU — ABNORMAL HIGH (ref 0.00–0.50)

## 2020-03-30 LAB — MAGNESIUM: Magnesium: 1.8 mg/dL (ref 1.7–2.4)

## 2020-03-30 LAB — C-REACTIVE PROTEIN: CRP: 17.4 mg/dL — ABNORMAL HIGH (ref <1.0)

## 2020-03-30 MED ORDER — VANCOMYCIN HCL 2000 MG/400ML IV SOLN
2000.0000 mg | INTRAVENOUS | Status: DC
  Administered 2020-03-31: 2000 mg via INTRAVENOUS
  Filled 2020-03-30: qty 400

## 2020-03-30 MED ORDER — SODIUM CHLORIDE 0.9 % IV SOLN: INTRAVENOUS | Status: DC

## 2020-03-30 MED ORDER — MOMETASONE FURO-FORMOTEROL FUM 200-5 MCG/ACT IN AERO
2.0000 | INHALATION_SPRAY | Freq: Two times a day (BID) | RESPIRATORY_TRACT | Status: DC
  Administered 2020-03-30 – 2020-04-01 (×5): 2 via RESPIRATORY_TRACT
  Filled 2020-03-30: qty 8.8

## 2020-03-30 MED ORDER — SODIUM CHLORIDE 0.9 % IV BOLUS
1000.0000 mL | Freq: Once | INTRAVENOUS | Status: AC
  Administered 2020-03-30: 1000 mL via INTRAVENOUS

## 2020-03-30 MED ORDER — VANCOMYCIN HCL 2000 MG/400ML IV SOLN
2000.0000 mg | Freq: Once | INTRAVENOUS | Status: AC
  Administered 2020-03-30: 2000 mg via INTRAVENOUS
  Filled 2020-03-30: qty 400

## 2020-03-30 MED ORDER — METHYLPREDNISOLONE SODIUM SUCC 125 MG IJ SOLR
60.0000 mg | Freq: Two times a day (BID) | INTRAMUSCULAR | Status: DC
  Administered 2020-03-30 – 2020-04-01 (×4): 60 mg via INTRAVENOUS
  Filled 2020-03-30 (×4): qty 2

## 2020-03-30 NOTE — Progress Notes (Signed)
Pharmacy Antibiotic Note  Tyrone Roman is a 75 y.o. male admitted on 03/25/2020 with sepsis.  Pharmacy has been consulted for vancomycin dosing. Covid +; Abx D#6, on Zosyn D#5, Tmax 101, WBC 3.7, SCr 1.22; repeat CXR: worsening multifocal PNA More lethargic today, BP trending down. To add back vancomycin & repeat sepsis work up. MRSA PCR negative 2/18 on admission.   Plan: Vancomycin 2 gm IV q24 for est AUC 548, SCr 1.22, Vd 0.72 Continues on Zosyn 3.375 q8hr F/u cultures, renal function, WBC, temp, BP Vancomycin levels as needed  Height: 6\' 2"  (188 cm) Weight: 91 kg (200 lb 9.9 oz) IBW/kg (Calculated) : 82.2  Temp (24hrs), Avg:99 F (37.2 C), Min:97.7 F (36.5 C), Max:101 F (38.3 C)  Recent Labs  Lab 03/25/20 1451 03/25/20 2038 03/26/20 0149 03/26/20 0847 03/27/20 0455 03/27/20 0923 03/28/20 0425 03/29/20 0439 03/30/20 0446  WBC 5.9  --  4.9  --  4.3  --  3.4* 3.1* 3.7*  CREATININE 1.46*  --  1.26*  --   --  1.22 1.30* 1.32* 1.22  LATICACIDVEN 2.3* 2.6*  --  1.3  --   --   --   --   --     Estimated Creatinine Clearance: 61.8 mL/min (by C-G formula based on SCr of 1.22 mg/dL).    Allergies  Allergen Reactions  . Cephalexin Shortness Of Breath, Rash and Other (See Comments)    Tolerated Augmentin 2015 and 2017 (12-2017). Tolerated nafcillin 12/2017 PATIENT HAS HAD A PCN REACTION WITH IMMEDIATE RASH, FACIAL/TONGUE/THROAT SWELLING, SOB, OR LIGHTHEADEDNESS WITH HYPOTENSION:  #  #  YES  #  #  Has patient had a PCN reaction causing severe rash involving mucus membranes or skin necrosis: No Has patient had a PCN reaction that required hospitalization: No Has patient had a PCN reaction occurring within the last 10 years: No If all of the above answers are "NO", then may proceed  . Clarithromycin Shortness Of Breath and Rash  . Certolizumab Pegol Hives  . Tocilizumab Hives  . Doxycycline Rash  . Hydroxyzine Rash  . Lidoderm Other (See Comments)    "made me act weird"   . Pyrithione Zinc Rash   Antimicrobials this admission: 2/18 vancomycin >>2/21  2/23>> 2/18 aztreonam>> 2/19 2/19 zosyn >> Dose adjustments this admission:  Microbiology results: 2/18 BCx x2: 1/4 bottle with GPC in clusters (BCID= staph epi, mecA+) F 2/18 UCx:  mult species FINAL 2/18 MRSA PCR: neg 2/18 sputum: normal flora F 2/18 COVID+ 2/20 bcx x2: NGTD  2/23 BCx2: ip 2/23 UCx: ip  Thank you for allowing pharmacy to be a part of this patient's care.  Eudelia Bunch, Pharm.D 03/30/2020 5:32 PM

## 2020-03-30 NOTE — Progress Notes (Signed)
Physical Therapy Treatment Patient Details Name: Tyrone Roman MRN: [id number] DOB: 12-15-1945 Today's Date: 03/30/2020    History of Present Illness Tyrone Roman is a 75 y.o. male with medical history significant of CAD, HLD, HTN, RA, BPH, COPD , MI, recent Covid infection and Covid pneumonia diagnosis 02/29/20, hospitalized again 2/10-2/12, returns to hospital reporting since his discharge due to has not felt better has been progressively getting worse with fever, chills, cough and SOB.    PT Comments    The patient is very weak, unable to progress ambulation as expected. Patient noted to be diaphoretic. BP stable. Temp 99. RN aware. Patient requiring 2 persons to stand and take small shuffling steps forward, recliner had to be brought up for patient to sit down. Patient may benefit from  Post acute rehab  In order to return to independence. Patient on 2 L with SPO2 range 90-92%. HR 101.    Follow Up Recommendations  SNF     Equipment Recommendations  None recommended by PT    Recommendations for Other Services       Precautions / Restrictions Precautions Precautions: Fall Precaution Comments: monitor O2, Airborn/Contact, enteric Restrictions Weight Bearing Restrictions: No    Mobility  Bed Mobility               General bed mobility comments: OOB in recliner, Rn reports +2 to assistance needed    Transfers Overall transfer level: Needs assistance Equipment used: Rolling walker (2 wheeled) Transfers: Sit to/from [facility] Sit to Stand: Min assist;+2 physical assistance;+2 safety/equipment         General transfer comment: Patient diaphoretic and vs monitiored. Patinet min x 2 to stand. Patient only able to take 3 steps forward.  Ambulation/Gait Ambulation/Gait assistance: Mod assist;+2 safety/equipment;+2 physical assistance Gait Distance (Feet): 4 Feet Assistive device: Rolling walker (2 wheeled) Gait Pattern/deviations: Step-to  pattern     General Gait Details: patient's steps shuffling ,knees flexed, very unsteady.   Stairs             Wheelchair Mobility    Modified Rankin (Stroke Patients Only)       Balance Overall balance assessment: Needs assistance Sitting-balance support: Feet supported Sitting balance-Leahy Scale: Fair     Standing balance support: During functional activity Standing balance-Leahy Scale: Poor Standing balance comment: reliant on external support                            Cognition Arousal/Alertness: Awake/alert Behavior During Therapy: WFL for tasks assessed/performed Overall Cognitive Status: Impaired/Different from baseline Area of Impairment: Orientation;Memory;Safety/judgement;Awareness                 Orientation Level: Place   Memory: Decreased short-term memory   Safety/Judgement: Decreased awareness of safety Awareness: Emergent   General Comments: patient asked "Where am I at",      Exercises      General Comments        Pertinent Vitals/Pain Pain Assessment: No/denies pain    Home Living                      Prior Function            PT Goals (current goals can now be found in the care plan section) Acute Rehab PT Goals Patient Stated Goal: get back to normal Progress towards PT goals: Not progressing toward goals - comment (patient is very weak)  Frequency    Min 2X/week      PT Plan Discharge plan needs to be updated    Co-evaluation PT/OT/SLP Co-Evaluation/Treatment: Yes Reason for Co-Treatment: For patient/therapist safety PT goals addressed during session: Mobility/safety with mobility OT goals addressed during session: ADL's and self-care      AM-PAC PT "6 Clicks" Mobility   Outcome Measure  Help needed turning from your back to your side while in a flat bed without using bedrails?: A Little Help needed moving from lying on your back to sitting on the side of a flat bed without  using bedrails?: A Little Help needed moving to and from a bed to a chair (including a wheelchair)?: A Lot Help needed standing up from a chair using your arms (e.g., wheelchair or bedside chair)?: A Lot Help needed to walk in hospital room?: A Lot Help needed climbing 3-5 steps with a railing? : Total 6 Click Score: 13    End of Session Equipment Utilized During Treatment: Gait belt;Oxygen Activity Tolerance: Patient limited by fatigue Patient left: in chair;with call bell/phone within reach;with chair alarm set Nurse Communication: Mobility status PT Visit Diagnosis: Unsteadiness on feet (R26.81);Other abnormalities of gait and mobility (R26.89);Muscle weakness (generalized) (M62.81)     Time: 4665-9935 PT Time Calculation (min) (ACUTE ONLY): 20 min  Charges:  $Gait Training: 8-22 mins                     [place] Pager [phone] Office [phone]    Claretha Cooper 03/30/2020, 1:40 PM

## 2020-03-30 NOTE — Consult Note (Signed)
NAME:  Tyrone Roman, MRN:  [id number], DOB:  1945/03/10, LOS: 5 ADMISSION DATE:  03/25/2020, CONSULTATION DATE:  03/30/20 REFERRING MD:  Tyrone Sago, MD CHIEF COMPLAINT:  Hypotension   Brief History:  75 year old male with hx covid s/p treatment in 02/2020 admitted for hypotension, weakness and decreased PO intake. PCCM consulted for hypotension and persistent fevers.  History of Present Illness:  [name] is a 75 year old male with recent covid hospitalization  He was admitted from 1/24-1/27 for covid pneumonia. He was treated with remdesivir and steroids. Did not receive immunosuppressants since he was already on Rituxan. He was readmitted from 2/9-2/11 for recurrent respiratory symptoms thought to be related to too rapid of a prednisone taper for his covid pneumonia, procalcitonin was negative that admission, had hypoxemia but did not require oxygen at discharge. Readmitted on 2/18 for fever, cough, weakness, decreased PO intake and hypotension. Also reported dysuria. In the ED his vitals were significant for fever, tachycardia and SpO2 90% on room air. CTA negative for PE with interval worsening of diffuse bilateral ground glass opacities, pleural plaques with calcifications.  He has been treated with IVF and Zosyn. He has continued to have persistent fevers despite medical management. Today he was confused and hypotensive with SBP in the 80s. IVF bolus ordered and PCCM was consulted.   On my exam, patient receiving IVF. Mental status improved. Patient was previously disoriented and now oriented x 4. He reports dyspnea has been persistent since his diagnosis of covid in January.   Social history Former smoker. Quit 35 years ago. 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  Micro Data:  Sputum 2/18 > Normal flora BCx 2/18 > S. hominis BCx 2/20>  BCx 2/23 > UCx 2/23 >  Antimicrobials:  Zosyn 2/18> Vanc 2/18> 2/21, 2/23  Interim History / Subjective:  As above  Objective   Blood pressure 132/84, pulse (!) 104, temperature 97.7 F (36.5 C), resp. rate 20, height 6\' 2"  (1.88 m), weight 91 kg, SpO2 95 %.        Intake/Output Summary (Last 24 hours) at 03/30/2020 1730 Last data filed at 03/30/2020 1513 Gross per 24 hour  Intake 173.39 ml  Output 1330 ml  Net -1156.61 ml   Filed Weights   03/27/20 0500 03/28/20 0500 03/29/20 0601  Weight: 93.4 kg 93.2 kg 91 kg    Physical Exam: General: Elderly, well-appearing, no acute distress HENT: [place], AT, OP clear, MMM Eyes: EOMI, no scleral icterus Respiratory: Diminished breath sounds bilaterally.  No crackles, wheezing or rales Cardiovascular: RRR, -M/R/G, no JVD GI: BS+, soft, nontender Extremities:-Edema,-tenderness Neuro: AAO x4, CNII-XII grossly intact Psych: Normal mood, normal affect  Resolved Hospital Problem list     Assessment & Plan:  Sepsis/septic shock secondary to presumed HCAP Febrile since admission and started on broad spectrum antibiotics. Fever curve does trend in the direction of improvement. Had episode of hypotension and confusion that resolved by the time I arrived. SBP initially in the 80s and after receiving fluid bolus improved to 130s. Patient is self-aware of his confusion which he believes occurs when he awakens in the hospital and  feels disoriented. He is oriented x 4 on interview. No indication to transfer to the ICU at this time --IVF bolus PRN for goal MAP >65 --Agree with broad spectrum antibiotics. Would extend to 7-10 day course. --Monitor fever curve  Acute hypoxemic respiratory failure Likely multifactorial  from HCAP in setting of prior covid infection. Chest imaging suggests scarring from his last hospitalization that has not fully resolved and may be interfering with his ability to exchange gas. His lung findings do not seem to be typical for rheumatoid arthritis. I also suspect reserve is diminished and may need oxygen at discharge. He also has a significant occupational exposure >30 years that make him susceptible to his recent lung infections. --Wean O2 for goal SpO2 >88% --Start Dulera TWO puffs TWICE a day --Agree with steroids with plan for taper --Antibiotics as above --Will need outpatient Pulmonary follow-up to arrange PFTs and repeat CT imaging --Ambulatory O2 prior to discharge. In the event he does not need home O2, will need to arrange overnight oximetry on room air  Pulmonary will continue to follow  Best practice (evaluated daily)  Diet: NPO Pain/Anxiety/Delirium protocol (if indicated): -- VAP protocol (if indicated): -- DVT prophylaxis: SubQ GI prophylaxis: PPI Glucose control: Per primary Mobility: As tolerated Disposition: Remain on floor  Goals of Care:  Last date of multidisciplinary goals of care discussion: Per primary Family and staff present: Summary of discussion:  Follow up goals of care discussion due: Per primary Code Status: Full code  Labs   CBC: Recent Labs  Lab 03/25/20 1451 03/26/20 0149 03/27/20 0455 03/28/20 0425 03/29/20 0439 03/30/20 0446  WBC 5.9 4.9 4.3 3.4* 3.1* 3.7*  NEUTROABS 5.1  --  3.5 2.7 2.4 2.9  HGB 13.1 11.0* 11.7* 10.8* 11.2* 11.7*  HCT 39.0 33.5* 33.7* 32.4* 34.1* 34.6*  MCV 87.8 89.3 85.1 86.9 88.6 86.7  PLT 95* 73* 56* 81* 99* 128*    Basic Metabolic Panel: Recent Labs  Lab 03/25/20 1508 03/26/20 0149 03/27/20 0923 03/28/20 0425 03/29/20 0439 03/30/20 0446  NA  --  132* 132* 131* 133* 137  K  --  3.7 3.9 3.7 3.7 4.5  CL  --  102 101 102 102 103  CO2  --  22 23 21* 22 24  GLUCOSE  --  156* 94 106* 97 115*   BUN  --  23 15 14 17 20   CREATININE  --  1.26* 1.22 1.30* 1.32* 1.22  CALCIUM  --  8.2* 8.2* 8.6* 9.0 9.0  MG 1.8  --  1.5* 1.6* 1.7 1.8   GFR: Estimated Creatinine Clearance: 61.8 mL/min (by C-G formula based on SCr of 1.22 mg/dL). Recent Labs  Lab 03/25/20 1451 03/25/20 2038 03/26/20 0149 03/26/20 0847 03/27/20 0455 03/28/20 0425 03/29/20 0439 03/30/20 0446  PROCALCITON  --   --  <0.10  --  <0.10 0.11  --   --   WBC 5.9  --  4.9  --  4.3 3.4* 3.1* 3.7*  LATICACIDVEN 2.3* 2.6*  --  1.3  --   --   --   --     Liver Function Tests: Recent Labs  Lab 03/25/20 1451  AST 51*  ALT 26  ALKPHOS 39  BILITOT 1.1  PROT 5.4*  ALBUMIN 2.3*   No results for input(s): LIPASE, AMYLASE in the last 168 hours. No results for input(s): AMMONIA in the last 168 hours.  ABG    Component Value Date/Time   PHART 7.369 06/08/2009 1024  PCO2ART 40.7 06/08/2009 1024   PO2ART 73.0 (L) 06/08/2009 1024   HCO3 24.5 (H) 06/08/2009 1029   TCO2 24 10/14/2009 2056   ACIDBASEDEF 1.0 06/08/2009 1029   O2SAT 68.0 06/08/2009 1029     Coagulation Profile: Recent Labs  Lab 03/25/20 1451 03/26/20 0149  INR 1.2 1.3*    Cardiac Enzymes: No results for input(s): CKTOTAL, CKMB, CKMBINDEX, TROPONINI in the last 168 hours.  HbA1C: Hgb A1c MFr Bld  Date/Time Value Ref Range Status  05/11/2019 01:34 PM 6.0 4.6 - 6.5 % Final    Comment:    Glycemic Control Guidelines for People with Diabetes:Non Diabetic:  <6%Goal of Therapy: <7%Additional Action Suggested:  >8%     CBG: Recent Labs  Lab 03/27/20 0819 03/28/20 0843 03/29/20 0745 03/30/20 0844  GLUCAP 85 91 92 99    Review of Systems:   Review of Systems  Constitutional: Positive for malaise/fatigue. Negative for chills, diaphoresis, fever and weight loss.  HENT: Negative for congestion, ear pain and sore throat.   Respiratory: Positive for cough and shortness of breath. Negative for hemoptysis, sputum production and wheezing.    Cardiovascular: Negative for chest pain, palpitations and leg swelling.  Gastrointestinal: Negative for abdominal pain, heartburn and nausea.  Genitourinary: Negative for frequency.  Musculoskeletal: Negative for joint pain and myalgias.  Skin: Negative for itching and rash.  Neurological: Negative for dizziness, weakness and headaches.  Endo/Heme/Allergies: Does not bruise/bleed easily.  Psychiatric/Behavioral: Negative for depression. The patient is not nervous/anxious.      Past Medical History:  He,  has a past medical history of Allergy, Barrett's esophageal ulceration, BPH (benign prostatic hyperplasia), CAD (coronary artery disease), Cataract, Colonic polyp, Concussion with loss of consciousness of 30 minutes or less, Contact dermatitis, Contact with or exposure to venereal diseases, COPD (chronic obstructive pulmonary disease) ([place]), Diverticulosis of colon, Dysphagia, GERD (gastroesophageal reflux disease), HNP (herniated nucleus pulposus), lumbar, Hyperlipidemia, Hypertension, Laceration of scalp, Myocardial infarction ([place]), Nephrolithiasis, Pre-operative cardiovascular examination, Rheumatoid arthritis ([place]), and SOB (shortness of breath).   Surgical History:   Past Surgical History:  Procedure Laterality Date  . BACK SURGERY    . CARDIAC CATHETERIZATION N/A 01/19/2016   Procedure: Left Heart Cath and Coronary Angiography;  Surgeon: Burnell Blanks, MD;  Location: [facility];  Service: Cardiovascular;  Laterality: N/A;  . COLONOSCOPY  12/09/2014   per Dr. Silverio Decamp, adenomatous polyps, repeat 3 years   . CORONARY ANGIOPLASTY WITH STENT PLACEMENT    . ESOPHAGOGASTRODUODENOSCOPY (EGD) WITH ESOPHAGEAL DILATION  02/17/09   Barretts esophagus   . EYE SURGERY    . KNEE ARTHROSCOPY Left X 2  . LUMBAR LAMINECTOMY/DECOMPRESSION MICRODISCECTOMY Right 10/07/2017   Procedure: RIGHT LUMBAR ONE-TWO LAMINECTOMY WITH MICRODISCECTOMY;  Surgeon: Consuella Lose, MD;  Location:  [facility];  Service: Neurosurgery;  Laterality: Right;  . LUMBAR LAMINECTOMY/DECOMPRESSION MICRODISCECTOMY N/A 12/06/2017   Procedure: RECURRENT MICRODISCECTOMY LUMBAR ONE - LUMBAR TWO;  Surgeon: Consuella Lose, MD;  Location: [facility];  Service: Neurosurgery;  Laterality: N/A;  . LUMBAR WOUND DEBRIDEMENT N/A 12/11/2017   Procedure: LUMBAR WOUND DEBRIDEMENT;  Surgeon: Consuella Lose, MD;  Location: [facility];  Service: Neurosurgery;  Laterality: N/A;  . POLYPECTOMY    . TONSILLECTOMY    . UPPER GASTROINTESTINAL ENDOSCOPY       Social History:   reports that he quit smoking about 31 years ago. His smoking use included cigarettes. He has a 20.00 pack-year smoking history. He has never used smokeless tobacco. He reports current alcohol use.  He reports that he does not use drugs.   Family History:  His family history includes Arthritis in his father and mother; Cancer in his maternal grandmother; Colon cancer in his father and paternal uncle; Colon polyps in his mother and sister; Dementia in his mother; Diabetes in his paternal uncle; Heart attack in his father; Melanoma in his mother and paternal uncle; Prostate cancer in his father; Skin cancer in his daughter; Stomach cancer in his paternal uncle. There is no history of Esophageal cancer or Rectal cancer.   Allergies Allergies  Allergen Reactions  . Cephalexin Shortness Of Breath, Rash and Other (See Comments)    Tolerated Augmentin 2015 and 2017 (12-2017). Tolerated nafcillin 12/2017 PATIENT HAS HAD A PCN REACTION WITH IMMEDIATE RASH, FACIAL/TONGUE/THROAT SWELLING, SOB, OR LIGHTHEADEDNESS WITH HYPOTENSION:  #  #  YES  #  #  Has patient had a PCN reaction causing severe rash involving mucus membranes or skin necrosis: No Has patient had a PCN reaction that required hospitalization: No Has patient had a PCN reaction occurring within the last 10 years: No If all of the above answers are "NO", then may proceed  . Clarithromycin Shortness Of Breath  and Rash  . Certolizumab Pegol Hives  . Tocilizumab Hives  . Doxycycline Rash  . Hydroxyzine Rash  . Lidoderm Other (See Comments)    "made me act weird"  . Pyrithione Zinc Rash     Home Medications  Prior to Admission medications   Medication Sig Start Date End Date Taking? Authorizing Provider  acetaminophen (TYLENOL) 325 MG tablet Take 650 mg by mouth every 6 (six) hours as needed for headache.   Yes [provider]  albuterol (VENTOLIN HFA) 108 (90 Base) MCG/ACT inhaler Inhale 2 puffs into the lungs every 4 (four) hours as needed for wheezing or shortness of breath. 03/21/20  Yes [name], MD  metoprolol succinate (TOPROL-XL) 25 MG 24 hr tablet Take 1 tablet (25 mg total) by mouth daily. 05/11/19  Yes [name], MD  omeprazole (PRILOSEC) 40 MG capsule Take 1 capsule (40 mg total) by mouth daily. 05/11/19  Yes [name], MD  potassium chloride SA (KLOR-CON M20) 20 MEQ tablet Take 1 tablet (20 mEq total) by mouth daily. 05/11/19  Yes [name], MD  simvastatin (ZOCOR) 40 MG tablet TAKE ONE TABLET BY MOUTH DAILY Patient taking differently: Take 40 mg by mouth daily at 6 PM. 09/07/19  Yes [name], MD  tamsulosin (FLOMAX) 0.4 MG CAPS capsule Take 2 capsules (0.8 mg total) by mouth daily. Patient taking differently: Take 0.4 mg by mouth daily. 12/07/19  Yes [name], MD  nitroGLYCERIN (NITROSTAT) 0.4 MG SL tablet Place 1 tablet (0.4 mg total) under the tongue every 5 (five) minutes as needed for chest pain (3 doses max). Patient taking differently: Place 0.4 mg under the tongue every 5 (five) minutes as needed for chest pain (max 3 doses). 12/20/17   [name], MD  predniSONE (DELTASONE) 10 MG tablet 40 mg x 2 days then 30 mg x 2 days then 20 mg x 2 days then 10 mg x 2 days then 5 mg x 2 days and stop Patient not taking: Reported on 03/25/2020 03/18/20   Geradine Girt, DO  riTUXimab (RITUXAN) 500 MG/50ML injection Inject into the vein. Every 4 months     [provider]  sildenafil (REVATIO) 20 MG tablet TAKE ONE TABLET BY MOUTH DAILY AS NEEDED Patient taking differently:  Take 20 mg by mouth daily as needed. TAKE ONE TABLET BY MOUTH DAILY AS NEEDED 12/18/19   [name], MD  zolpidem (AMBIEN) 10 MG tablet TAKE ONE TABLET BY MOUTH EVERY NIGHT AT BEDTIME Patient taking differently: Take 10 mg by mouth at bedtime as needed for sleep. 12/18/19   [name], MD     Critical Care Time: N/A    Rodman Pickle, M.D. [facility] Pulmonary/Critical Care Medicine 03/30/2020 5:31 PM

## 2020-03-30 NOTE — Telephone Encounter (Signed)
He is currently in the hospital

## 2020-03-30 NOTE — Progress Notes (Addendum)
Occupational Therapy RE-Evaluation Patient Details Name: Tyrone Roman MRN: [id number] DOB: [birth year] Today's Date: 03/30/2020    History of Present Illness Tyrone Roman is a 75 y.o. male with medical history significant of CAD, HLD, HTN, RA, BPH, COPD , MI, recent Covid infection and Covid pneumonia diagnosis 02/29/20, hospitalized again 2/10-2/12, returns to hospital reporting since his discharge due to has not felt better has been progressively getting worse with fever, chills, cough and SOB.   Clinical Impression   Patient presents with generalized weakness, decreased activity tolerance and impaired balance - with a worse presentation today compared to his original evaluation. Patient min x 2 stand and only able to take 3 steps forward with RW. Patient requiring 2 L [place], diaphoretic during activity, needing set up and seated positioning for UB ADLs, mod assist for LB dressing and max assist for toileting. Patient's cognition somewhat questionable today as patient asking "where he is at?" and exhibits some poor insight into his deficits and wandering if he would be getting therapy at home. Therapist educated patient on his significant weakness and needing help with movement and ADLs- that home would not be an appropriate discharge plan. Patient agreeable to rehab at [facility]. POC changed and goals downgraded. Recommend short term rehab at discharge at this time.    Follow Up Recommendations  SNF    Equipment Recommendations  None recommended by OT    Recommendations for Other Services       Precautions / Restrictions Precautions Precautions: Fall Precaution Comments: monitor O2, [place]/Contact, enteric Restrictions Weight Bearing Restrictions: No      Mobility Bed Mobility               General bed mobility comments: OOB in recliner, Rn reports +2 to assistance needed    Transfers Overall transfer level: Needs assistance Equipment used: Rolling walker (2  wheeled) Transfers: Sit to/from [facility] Sit to Stand: Min assist;+2 physical assistance;+2 safety/equipment         General transfer comment: Patient diaphoretic and vs monitiored. [place] min x 2 to stand. Patient only able to take 3 steps forward.    Balance Overall balance assessment: Needs assistance Sitting-balance support: Feet supported Sitting balance-Leahy Scale: Fair     Standing balance support: During functional activity Standing balance-Leahy Scale: Poor Standing balance comment: reliant on external support                           ADL either performed or assessed with clinical judgement   ADL Overall ADL's : Needs assistance/impaired Eating/Feeding: Set up   Grooming: Set up;Wash/dry face   Upper Body Bathing: Set up;Sitting   Lower Body Bathing: Minimal assistance;Sitting/lateral leans   Upper Body Dressing : Set up;Sitting   Lower Body Dressing: Sitting/lateral leans;Sit to/from stand;Cueing for compensatory techniques;Moderate assistance   Toilet Transfer: +2 for safety/equipment;Minimal assistance;+2 for physical assistance;BSC;Stand-pivot   Toileting- Clothing Manipulation and Hygiene: +2 for safety/equipment;Maximal assistance;Sit to/from stand               Vision Patient Visual Report: No change from baseline Vision Assessment?: No apparent visual deficits     Perception     Praxis      Pertinent Vitals/Pain Pain Assessment: No/denies pain     Hand Dominance     Extremity/Trunk Assessment             Communication     Cognition Arousal/Alertness: Awake/alert Behavior During Therapy: [facility] for  tasks assessed/performed Overall Cognitive Status: Impaired/Different from baseline Area of Impairment: Orientation;Memory;Safety/judgement;Awareness                 Orientation Level: Place   Memory: Decreased short-term memory   Safety/Judgement: Decreased awareness of safety Awareness:  Emergent   General Comments: patient asked "Where am I at"   General Comments       Exercises     Shoulder Instructions      Home Living                                          Prior Functioning/Environment                   OT Problem List:        OT Treatment/Interventions:      OT Goals(Current goals can be found in the care plan section) Acute Rehab OT Goals Patient Stated Goal: get back to normal OT Goal Formulation: With patient Time For Goal Achievement: 04/13/20 Potential to Achieve Goals: Fair  OT Frequency: Min 2X/week   Barriers to D/C:            Co-evaluation PT/OT/SLP Co-Evaluation/Treatment: Yes Reason for Co-Treatment: Complexity of the patient's impairments (multi-system involvement);For patient/therapist safety;To address functional/ADL transfers PT goals addressed during session: Mobility/safety with mobility OT goals addressed during session: ADL's and self-care      AM-PAC OT "6 Clicks" Daily Activity     Outcome Measure Help from another person eating meals?: None Help from another person taking care of personal grooming?: A Little Help from another person toileting, which includes using toliet, bedpan, or urinal?: A Lot Help from another person bathing (including washing, rinsing, drying)?: A Little Help from another person to put on and taking off regular upper body clothing?: A Little Help from another person to put on and taking off regular lower body clothing?: A Lot 6 Click Score: 17   End of Session Equipment Utilized During Treatment: Gait belt;Rolling walker;Oxygen Nurse Communication: Mobility status  Activity Tolerance: Patient limited by fatigue Patient left: in chair;with chair alarm set  OT Visit Diagnosis: History of falling (Z91.81);Unsteadiness on feet (R26.81);Muscle weakness (generalized) (M62.81)                Time: 4259-5638 OT Time Calculation (min): 26 min Charges:  OT General  Charges $OT Visit: 1 Visit OT Evaluation $OT Re-eval: 1 Re-eval  Ranita Stjulien, OTR/L [place]  Office [phone] Pager: [place] 03/30/2020, 1:34 PM

## 2020-03-30 NOTE — Telephone Encounter (Signed)
Spoke with patient, he is not feeling very well and has requested that another antibiotic be sent to the pharmacy.

## 2020-03-30 NOTE — Plan of Care (Signed)
  Problem: Fluid Volume: Goal: Hemodynamic stability will improve Outcome: Progressing   Problem: Clinical Measurements: Goal: Diagnostic test results will improve Outcome: Progressing Goal: Signs and symptoms of infection will decrease Outcome: Progressing   Problem: Respiratory: Goal: Ability to maintain adequate ventilation will improve Outcome: Progressing   Problem: Education: Goal: Knowledge of General Education information will improve Description: Including pain rating scale, medication(s)/side effects and non-pharmacologic comfort measures Outcome: Progressing   Problem: Health Behavior/Discharge Planning: Goal: Ability to manage health-related needs will improve Outcome: Progressing   Problem: Clinical Measurements: Goal: Ability to maintain clinical measurements within normal limits will improve Outcome: Progressing Goal: Will remain free from infection Outcome: Progressing Goal: Diagnostic test results will improve Outcome: Progressing Goal: Respiratory complications will improve Outcome: Progressing Goal: Cardiovascular complication will be avoided Outcome: Progressing   Problem: Activity: Goal: Risk for activity intolerance will decrease Outcome: Progressing   Problem: Nutrition: Goal: Adequate nutrition will be maintained Outcome: Progressing   Problem: Coping: Goal: Level of anxiety will decrease Outcome: Progressing   Problem: Elimination: Goal: Will not experience complications related to bowel motility Outcome: Progressing Goal: Will not experience complications related to urinary retention Outcome: Progressing   Problem: Pain Managment: Goal: General experience of comfort will improve Outcome: Progressing   Problem: Safety: Goal: Ability to remain free from injury will improve Outcome: Progressing   Problem: Skin Integrity: Goal: Risk for impaired skin integrity will decrease Outcome: Progressing   Problem: Education: Goal:  Knowledge of risk factors and measures for prevention of condition will improve Outcome: Progressing   Problem: Coping: Goal: Psychosocial and spiritual needs will be supported Outcome: Progressing   Problem: Respiratory: Goal: Will maintain a patent airway Outcome: Progressing Goal: Complications related to the disease process, condition or treatment will be avoided or minimized Outcome: Progressing

## 2020-03-30 NOTE — Progress Notes (Signed)
PROGRESS NOTE    [name]   QIW:[id number]  DOB: [birth year]  DOA: 03/25/2020     5  PCP: [name], MD  CC: [facility] Course: Tyrone Roman is a 75 yo male with PMH CAD, HTN, HLD, RA, BPH, COPD (not on chronic O2) and recent COVID-19 infection (02/29/20, s/p Remdesivir/steroids) followed by hospitalziation 2/10 - 2/12 for SOB/hypoxia. He has been relatively immobile at home due to ongoing shortness of breath and has not been doing much other than laying in bed.  He again presents back to the ER with his ongoing shortness of breath as well as fevers, chills, cough productive of brown sputum. He underwent CXR in the ER which was notable for bilateral opacities concerning for worsening pneumonia.  Lactic was mildly elevated, procalcitonin was negative. He was saturating well on room air and did not require oxygen. He was placed on antibiotics and received IV fluids for presumed sepsis due to pneumonia. Repeat Covid PCR was again positive on 03/25/2020. Due to ongoing oxygen demand, immobility at home, and worsening feeling of shortness of breath as well as elevated inflammatory markers, he underwent CTA chest on 03/26/2020.  Slight motion artifact however negative for PE.  Worsening groundglass opacities appreciated. Blood cultures from admission were also positive for 1/4 bottles with MRSE. Cultures were re-drawn and he was continued on abx for coverage due to potential for decline.   Interval History:  Patient noted to be more lethargic today, noted to have fever last night of 101, more encephalopathic this morning.  BP noted to trend downwards.  Very weak and frail   Assessment & Plan: Severe sepsis Now hypotensive Fever, tachycardia, tachypnea, lactic acidosis.  Source presumed lung at this time Noted to spike temp to 101 on 03/29/2020, repeat sepsis work-up ordered, repeat blood culture, urine culture all pending Procalcitonin (negative x 2) Blood culture grew 1/4 bottles with  MRSE, repeat culture has further speciated to [place], moreso a contaminant, another repeat BC X 2 with NGTD Continue zosyn, start vancomycin PCCM consulted  Acute respiratory failure with hypoxia Requiring O2, 2L Previously treated with remdesivir and steroids in January during Covid infection Continue oxygen and wean as able Start Solu-Medrol Supplemental O2 as needed  Pneumonia due to COVID-19 virus s/p treatment previously with remdesivir Continue breathing treatments, IS, and supportive care  CTA chest negative for PE, shows worsening groundglass opacity, repeat CXR with worsening multifocal pneumonia Given concern for superimposed bacterial infeciton, continue antibiotics, add vancomycin Start Solu-Medrol Bilateral lower extremity Doppler pending Patient allergic to Actemra  Hypotension Likely due to sepsis IV fluid bolus, continuous  PCCM consulted  Acute metabolic encephalopathy Likely multifactorial most likely due to pneumonia CT head pending Further management as above  Diarrhea Loose, not watery ?antibiotic related GI panel pending Monitor closely  Physical deconditioning Considered due to underlying recent COVID PT eval, rec SNF  Stage 3b chronic kidney disease Baseline creat ~ 1.4 - 1.6, eGFR 48-50  Thrombocytopenia  Baseline appears 60-90k Daily CBC  COPD Continue routine care  BPH Hold Flomax due to hypotension  GERD Continue PPI  Coronary artery disease of native artery of native heart with stable angina pectoris (HCC) Currently chest pain free  Hyperlipidemia Continue statin  GOC discussion Patient with very poor prognosis due to multiple comorbidities Discussed extensively with daughter about CODE STATUS, will discuss with siblings and get back to me Plan for palliative consult    Antimicrobials: Vanc 2/18>> 03/29/2019 Zosyn 2/19>>current Aztreonam  2/18>>2/19  DVT prophylaxis: heparin injection 5,000 Units Start:  03/25/20 2200   Code Status:   Code Status: Full Code Family Communication: Discussed with daughter Ms Tyrone Roman on 03/30/20  Disposition Plan: Status is: Inpatient  Remains inpatient appropriate because:IV treatments appropriate due to intensity of illness or inability to take PO and Inpatient level of care appropriate due to severity of illness   Dispo: The patient is from: Home              Anticipated d/c is to: SNF              Anticipated d/c date is: 3 days              Patient currently is not medically stable to d/c.   Difficult to place patient No    Objective: Blood pressure (!) 87/69, pulse 94, temperature 97.7 F (36.5 C), temperature source Oral, resp. rate 20, height _0  (1.88 m), weight 91 kg, SpO2 95 %.   Examination:  General: NAD, deconditioned, lethargic, encephalopathic  Cardiovascular: S1, S2 present  Respiratory: Coarse BS bilaterally  Abdomen: Soft, nontender, nondistended, bowel sounds present  Musculoskeletal: No bilateral pedal edema noted  Skin: Normal  Psychiatry:  Unable to assess   Consultants:   PCCM  Procedures:   None  Data Reviewed: I have personally reviewed following labs and imaging studies Results for orders placed or performed during the hospital encounter of 03/25/20 (from the past 24 hour(s))  Basic metabolic panel     Status: Abnormal   Collection Time: 03/30/20  4:46 AM  Result Value Ref Range   Sodium 137 135 - 145 mmol/L   Potassium 4.5 3.5 - 5.1 mmol/L   Chloride 103 98 - 111 mmol/L   CO2 24 22 - 32 mmol/L   Glucose, Bld 115 (H) 70 - 99 mg/dL   BUN 20 8 - 23 mg/dL   Creatinine, Ser 1.22 0.61 - 1.24 mg/dL   Calcium 9.0 8.9 - 10.3 mg/dL   GFR, Estimated >60 >60 mL/min   Anion gap 10 5 - 15  CBC with Differential/Platelet     Status: Abnormal   Collection Time: 03/30/20  4:46 AM  Result Value Ref Range   WBC 3.7 (L) 4.0 - 10.5 K/uL   RBC 3.99 (L) 4.22 - 5.81 MIL/uL   Hemoglobin 11.7 (L) 13.0 - 17.0 g/dL    HCT 34.6 (L) 39.0 - 52.0 %   MCV 86.7 80.0 - 100.0 fL   MCH 29.3 26.0 - 34.0 pg   MCHC 33.8 30.0 - 36.0 g/dL   RDW 14.8 11.5 - 15.5 %   Platelets 128 (L) 150 - 400 K/uL   nRBC 0.0 0.0 - 0.2 %   Neutrophils Relative % 80 %   Neutro Abs 2.9 1.7 - 7.7 K/uL   Lymphocytes Relative 11 %   Lymphs Abs 0.4 (L) 0.7 - 4.0 K/uL   Monocytes Relative 4 %   Monocytes Absolute 0.1 0.1 - 1.0 K/uL   Eosinophils Relative 3 %   Eosinophils Absolute 0.1 0.0 - 0.5 K/uL   Basophils Relative 1 %   Basophils Absolute 0.0 0.0 - 0.1 K/uL   WBC Morphology MORPHOLOGY UNREMARKABLE    Immature Granulocytes 1 %   Abs Immature Granulocytes 0.05 0.00 - 0.07 K/uL  Magnesium     Status: None   Collection Time: 03/30/20  4:46 AM  Result Value Ref Range   Magnesium 1.8 1.7 - 2.4 mg/dL  C-reactive protein  Status: Abnormal   Collection Time: 03/30/20  4:46 AM  Result Value Ref Range   CRP 17.4 (H) <1.0 mg/dL  Lactate dehydrogenase     Status: Abnormal   Collection Time: 03/30/20  4:46 AM  Result Value Ref Range   LDH 392 (H) 98 - 192 U/L  D-dimer, quantitative     Status: Abnormal   Collection Time: 03/30/20  4:46 AM  Result Value Ref Range   D-Dimer, Quant 2.17 (H) 0.00 - 0.50 ug/mL-FEU  Glucose, capillary     Status: None   Collection Time: 03/30/20  8:44 AM  Result Value Ref Range   Glucose-Capillary 99 70 - 99 mg/dL    Recent Results (from the past 240 hour(s))  Urine culture     Status: Abnormal   Collection Time: 03/25/20  2:36 PM   Specimen: In/Out Cath Urine  Result Value Ref Range Status   Specimen Description   Final    IN/OUT CATH URINE Performed at [facility], [place] [address], [place], [place] [address]    Special Requests   Final    NONE Performed at [facility], [place] [address] [place], [place] [address]    Culture MULTIPLE SPECIES PRESENT, SUGGEST RECOLLECTION (A)  Final   Report Status 03/27/2020 FINAL  Final  Blood Culture (routine x 2)      Status: None   Collection Time: 03/25/20  4:45 PM   Specimen: BLOOD LEFT HAND  Result Value Ref Range Status   Specimen Description   Final    BLOOD LEFT HAND Performed at [facility] [address] [place], [place] [address]    Special Requests   Final    BOTTLES DRAWN AEROBIC AND ANAEROBIC Blood Culture adequate volume Performed at [facility] [address] [place], [place] [address]    Culture   Final    NO GROWTH 5 DAYS Performed at [facility], [place] [address], [place], [place] [address]    Report Status 03/30/2020 FINAL  Final  Blood Culture (routine x 2)     Status: Abnormal   Collection Time: 03/25/20  4:45 PM   Specimen: BLOOD LEFT FOREARM  Result Value Ref Range Status   Specimen Description   Final    BLOOD LEFT FOREARM Performed at [facility] [address], [place], [place] [address]    Special Requests   Final    BOTTLES DRAWN AEROBIC AND ANAEROBIC Blood Culture adequate volume Performed at [facility] [address], [place], [place] [address]    Culture  Setup Time   Final    GRAM POSITIVE COCCI IN CLUSTERS AEROBIC BOTTLE ONLY CRITICAL RESULT CALLED TO, READ BACK BY AND VERIFIED WITH: A PHAM PHARMD _0  03/26/20 EB    Culture (A)  Final    STAPHYLOCOCCUS HOMINIS THE SIGNIFICANCE OF ISOLATING THIS ORGANISM FROM A SINGLE SET OF BLOOD CULTURES WHEN MULTIPLE SETS ARE DRAWN IS UNCERTAIN. PLEASE NOTIFY THE MICROBIOLOGY DEPARTMENT WITHIN ONE WEEK IF SPECIATION AND SENSITIVITIES ARE REQUIRED. Performed at [facility], [place] [address] [place], [place] [address]    Report Status 03/28/2020 FINAL  Final  Blood Culture ID Panel (Reflexed)     Status: Abnormal   Collection Time: 03/25/20  4:45 PM  Result Value Ref Range Status   Enterococcus faecalis NOT DETECTED NOT DETECTED Final   Enterococcus Faecium NOT DETECTED NOT DETECTED Final   Listeria monocytogenes NOT  DETECTED  NOT DETECTED Final   Staphylococcus species DETECTED (A) NOT DETECTED Final    Comment: CRITICAL RESULT CALLED TO, READ BACK BY AND VERIFIED WITH: A PHAM PHARMD _0  03/26/20 EB    Staphylococcus aureus (BCID) NOT DETECTED NOT DETECTED Final   Staphylococcus epidermidis DETECTED (A) NOT DETECTED Final    Comment: Methicillin (oxacillin) resistant coagulase negative staphylococcus. Possible blood culture contaminant (unless isolated from more than one blood culture draw or clinical case suggests pathogenicity). No antibiotic treatment is indicated for blood  culture contaminants. CRITICAL RESULT CALLED TO, READ BACK BY AND VERIFIED WITH: A PHAM PHARMD _1  03/26/20 EB    Staphylococcus lugdunensis NOT DETECTED NOT DETECTED Final   Streptococcus species NOT DETECTED NOT DETECTED Final   Streptococcus agalactiae NOT DETECTED NOT DETECTED Final   Streptococcus pneumoniae NOT DETECTED NOT DETECTED Final   Streptococcus pyogenes NOT DETECTED NOT DETECTED Final   A.calcoaceticus-baumannii NOT DETECTED NOT DETECTED Final   Bacteroides fragilis NOT DETECTED NOT DETECTED Final   Enterobacterales NOT DETECTED NOT DETECTED Final   Enterobacter cloacae complex NOT DETECTED NOT DETECTED Final   Escherichia coli NOT DETECTED NOT DETECTED Final   Klebsiella aerogenes NOT DETECTED NOT DETECTED Final   Klebsiella oxytoca NOT DETECTED NOT DETECTED Final   Klebsiella pneumoniae NOT DETECTED NOT DETECTED Final   Proteus species NOT DETECTED NOT DETECTED Final   Salmonella species NOT DETECTED NOT DETECTED Final   Serratia marcescens NOT DETECTED NOT DETECTED Final   Haemophilus influenzae NOT DETECTED NOT DETECTED Final   Neisseria meningitidis NOT DETECTED NOT DETECTED Final   Pseudomonas aeruginosa NOT DETECTED NOT DETECTED Final   Stenotrophomonas maltophilia NOT DETECTED NOT DETECTED Final   Candida albicans NOT DETECTED NOT DETECTED Final   Candida auris NOT DETECTED NOT DETECTED Final    Candida glabrata NOT DETECTED NOT DETECTED Final   Candida krusei NOT DETECTED NOT DETECTED Final   Candida parapsilosis NOT DETECTED NOT DETECTED Final   Candida tropicalis NOT DETECTED NOT DETECTED Final   Cryptococcus neoformans/gattii NOT DETECTED NOT DETECTED Final   Methicillin resistance mecA/C DETECTED (A) NOT DETECTED Final    Comment: CRITICAL RESULT CALLED TO, READ BACK BY AND VERIFIED WITH: A PHAM PHARMD _2  03/26/20 EB Performed at [facility], [address], [place], [place] [address]   Resp Panel by RT-PCR (Flu A&B, Covid) Nasopharyngeal Swab     Status: Abnormal   Collection Time: 03/25/20  5:47 PM   Specimen: Nasopharyngeal Swab; Nasopharyngeal(NP) swabs in vial transport medium  Result Value Ref Range Status   SARS Coronavirus 2 by RT PCR POSITIVE (A) NEGATIVE Final    Comment: RESULT CALLED TO, READ BACK BY AND VERIFIED WITH: C SIMPSON AT 1852 ON 03/25/2020 BY JPM (NOTE) SARS-CoV-2 target nucleic acids are DETECTED.  The SARS-CoV-2 RNA is generally detectable in upper respiratory specimens during the acute phase of infection. Positive results are indicative of the presence of the identified virus, but do not rule out bacterial infection or co-infection with other pathogens not detected by the test. Clinical correlation with patient history and other diagnostic information is necessary to determine patient infection status. The expected result is Negative.  Fact Sheet for Patients: EntrepreneurPulse.com.au  Fact Sheet for Healthcare Providers: IncredibleEmployment.be  This test is not yet approved or cleared by the [place] FDA and  has been authorized for detection and/or diagnosis of SARS-CoV-2 by FDA under an Emergency Use Authorization (EUA).  This EUA will remain in effect (meaning this test ca n be  used) for the duration of  the COVID-19 declaration under Section 564(b)(1) of the Act, 21 U.S.C. section  360bbb-3(b)(1), unless the authorization is terminated or revoked sooner.     Influenza A by PCR NEGATIVE NEGATIVE Final   Influenza B by PCR NEGATIVE NEGATIVE Final    Comment: (NOTE) The Xpert Xpress SARS-CoV-2/FLU/RSV plus assay is intended as an aid in the diagnosis of influenza from Nasopharyngeal swab specimens and should not be used as a sole basis for treatment. Nasal washings and aspirates are unacceptable for Xpert Xpress SARS-CoV-2/FLU/RSV testing.  Fact Sheet for Patients: EntrepreneurPulse.com.au  Fact Sheet for Healthcare Providers: IncredibleEmployment.be  This test is not yet approved or cleared by the [place] FDA and has been authorized for detection and/or diagnosis of SARS-CoV-2 by FDA under an Emergency Use Authorization (EUA). This EUA will remain in effect (meaning this test can be used) for the duration of the COVID-19 declaration under Section 564(b)(1) of the Act, 21 U.S.C. section 360bbb-3(b)(1), unless the authorization is terminated or revoked.  Performed at [facility], [place] [address] [place], [place] [address]   Culture, sputum-assessment     Status: None   Collection Time: 03/25/20  8:38 PM   Specimen: Expectorated Sputum  Result Value Ref Range Status   Specimen Description EXPECTORATED SPUTUM  Final   Special Requests NONE  Final   Sputum evaluation   Final    THIS SPECIMEN IS ACCEPTABLE FOR SPUTUM CULTURE Performed at [facility], [place] [address], [place], [place] [address]    Report Status 03/25/2020 FINAL  Final  MRSA PCR Screening     Status: None   Collection Time: 03/25/20  8:38 PM   Specimen: Nasal Mucosa; Nasopharyngeal  Result Value Ref Range Status   MRSA by PCR NEGATIVE NEGATIVE Final    Comment:        The GeneXpert MRSA Assay (FDA approved for NASAL specimens only), is one component of a comprehensive MRSA colonization surveillance program.  It is not intended to diagnose MRSA infection nor to guide or monitor treatment for MRSA infections. Performed at [facility], [place], [place] [address] [place], [place] [address]   Culture, Respiratory w Gram Stain     Status: None   Collection Time: 03/25/20  8:38 PM  Result Value Ref Range Status   Specimen Description   Final    EXPECTORATED SPUTUM Performed at [facility] [address], [place], [place] [address]    Special Requests   Final    NONE Reflexed from [id number] Performed at [facility] [address], [place], [place] [address]    Gram Stain   Final    NO WBC SEEN FEW SQUAMOUS EPITHELIAL CELLS PRESENT ABUNDANT GRAM POSITIVE COCCI ABUNDANT GRAM NEGATIVE RODS MODERATE GRAM POSITIVE RODS    Culture   Final    ABUNDANT Consistent with normal respiratory flora. No Pseudomonas species isolated Performed at [facility] [address] [place], [place] [address]    Report Status 03/28/2020 FINAL  Final  Culture, blood (routine x 2)     Status: None (Preliminary result)   Collection Time: 03/27/20  9:23 AM   Specimen: BLOOD RIGHT HAND  Result Value Ref Range Status   Specimen Description   Final    BLOOD RIGHT HAND Performed at [facility], [place] [address], [place], [place] [address]    Special Requests   Final    BOTTLES DRAWN AEROBIC  ONLY Blood Culture results may not be optimal due to an inadequate volume of blood received in culture bottles Performed at [facility], [place] [address], [place], [place] [address]    Culture   Final    NO GROWTH 3 DAYS Performed at [facility], [place] [address], [place], [place] [address]    Report Status PENDING  Incomplete  Culture, blood (routine x 2)     Status: None (Preliminary result)   Collection Time: 03/27/20  9:23 AM   Specimen: BLOOD LEFT HAND  Result Value Ref Range Status   Specimen Description   Final    BLOOD LEFT  HAND Performed at [facility], [place] [address] [place], [place] [address]    Special Requests   Final    BOTTLES DRAWN AEROBIC AND ANAEROBIC Blood Culture results may not be optimal due to an inadequate volume of blood received in culture bottles Performed at [facility] [address] [place], [place] [address]    Culture   Final    NO GROWTH 3 DAYS Performed at [facility], [place] [address] [place], [place] [address]    Report Status PENDING  Incomplete     Radiology Studies: DG Chest Port 1 View  Result Date: 03/30/2020 CLINICAL DATA:  Fever.  History of COPD. EXAM: PORTABLE CHEST 1 VIEW COMPARISON:  CT chest 03/26/2020. Single-view of the chest 03/25/2020. FINDINGS: Hazy multifocal airspace disease has worsened since the prior plain film. Heart size is normal. No pneumothorax or pleural effusion. Emphysema again noted. No acute or focal bony abnormality. IMPRESSION: Worsened multifocal pneumonia. Electronically Signed   By: Inge Rise M.D.   On: 03/30/2020 14:11   DG Chest Port 1 View  Final Result    CT ANGIO CHEST PE W OR WO CONTRAST  Final Result    DG Chest Port 1 View  Final Result    CT HEAD WO CONTRAST    (Results Pending)    Scheduled Meds: . (feeding supplement) PROSource Plus  30 mL Oral BID BM  . benzonatate  100 mg Oral TID  . feeding supplement  1 Container Oral Q24H  . feeding supplement  237 mL Oral BID BM  . heparin  5,000 Units Subcutaneous Q8H  . pantoprazole  40 mg Oral Daily  . simvastatin  40 mg Oral q1800  . sodium chloride flush  3 mL Intravenous Q12H  . sodium chloride flush  3 mL Intravenous Q12H  . tamsulosin  0.4 mg Oral Daily   PRN Meds: sodium chloride, acetaminophen **OR** [DISCONTINUED] acetaminophen, bisacodyl, hydrALAZINE, ipratropium, levalbuterol, ondansetron **OR** ondansetron (ZOFRAN) IV, senna-docusate, sodium chloride flush, zolpidem Continuous Infusions: . sodium chloride    .  piperacillin-tazobactam (ZOSYN)  IV 3.375 g (03/30/20 1513)     LOS: 5 days    [place]ma Friendly, MD [facility] 03/30/2020, 4:28 PM

## 2020-03-31 ENCOUNTER — Telehealth: Payer: Medicare Other

## 2020-03-31 ENCOUNTER — Inpatient Hospital Stay (HOSPITAL_COMMUNITY): Payer: Medicare Other

## 2020-03-31 DIAGNOSIS — J9601 Acute respiratory failure with hypoxia: Secondary | ICD-10-CM | POA: Diagnosis not present

## 2020-03-31 DIAGNOSIS — M7989 Other specified soft tissue disorders: Secondary | ICD-10-CM

## 2020-03-31 DIAGNOSIS — R6520 Severe sepsis without septic shock: Secondary | ICD-10-CM | POA: Diagnosis not present

## 2020-03-31 DIAGNOSIS — E785 Hyperlipidemia, unspecified: Secondary | ICD-10-CM | POA: Diagnosis not present

## 2020-03-31 DIAGNOSIS — A419 Sepsis, unspecified organism: Secondary | ICD-10-CM | POA: Diagnosis not present

## 2020-03-31 DIAGNOSIS — J439 Emphysema, unspecified: Secondary | ICD-10-CM | POA: Diagnosis not present

## 2020-03-31 LAB — CBC WITH DIFFERENTIAL/PLATELET
Abs Immature Granulocytes: 0.03 10*3/uL (ref 0.00–0.07)
Basophils Absolute: 0 10*3/uL (ref 0.0–0.1)
Basophils Relative: 1 %
Eosinophils Absolute: 0 10*3/uL (ref 0.0–0.5)
Eosinophils Relative: 0 %
HCT: 31.8 % — ABNORMAL LOW (ref 39.0–52.0)
Hemoglobin: 10.2 g/dL — ABNORMAL LOW (ref 13.0–17.0)
Immature Granulocytes: 1 %
Lymphocytes Relative: 10 %
Lymphs Abs: 0.2 10*3/uL — ABNORMAL LOW (ref 0.7–4.0)
MCH: 28.6 pg (ref 26.0–34.0)
MCHC: 32.1 g/dL (ref 30.0–36.0)
MCV: 89.1 fL (ref 80.0–100.0)
Monocytes Absolute: 0.1 10*3/uL (ref 0.1–1.0)
Monocytes Relative: 3 %
Neutro Abs: 1.9 10*3/uL (ref 1.7–7.7)
Neutrophils Relative %: 85 %
Platelets: 129 10*3/uL — ABNORMAL LOW (ref 150–400)
RBC: 3.57 MIL/uL — ABNORMAL LOW (ref 4.22–5.81)
RDW: 14.9 % (ref 11.5–15.5)
WBC: 2.2 10*3/uL — ABNORMAL LOW (ref 4.0–10.5)
nRBC: 0 % (ref 0.0–0.2)

## 2020-03-31 LAB — BASIC METABOLIC PANEL
Anion gap: 10 (ref 5–15)
BUN: 23 mg/dL (ref 8–23)
CO2: 21 mmol/L — ABNORMAL LOW (ref 22–32)
Calcium: 8.8 mg/dL — ABNORMAL LOW (ref 8.9–10.3)
Chloride: 106 mmol/L (ref 98–111)
Creatinine, Ser: 1.1 mg/dL (ref 0.61–1.24)
GFR, Estimated: >60 mL/min (ref >60)
Glucose, Bld: 204 mg/dL — ABNORMAL HIGH (ref 70–99)
Potassium: 4.2 mmol/L (ref 3.5–5.1)
Sodium: 137 mmol/L (ref 135–145)

## 2020-03-31 LAB — C-REACTIVE PROTEIN: CRP: 17 mg/dL — ABNORMAL HIGH (ref <1.0)

## 2020-03-31 LAB — D-DIMER, QUANTITATIVE: D-Dimer, Quant: 1.6 ug/mL-FEU — ABNORMAL HIGH (ref 0.00–0.50)

## 2020-03-31 LAB — MAGNESIUM: Magnesium: 1.9 mg/dL (ref 1.7–2.4)

## 2020-03-31 LAB — LACTATE DEHYDROGENASE: LDH: 345 U/L — ABNORMAL HIGH (ref 98–192)

## 2020-03-31 LAB — GLUCOSE, CAPILLARY: Glucose-Capillary: 191 mg/dL — ABNORMAL HIGH (ref 70–99)

## 2020-03-31 MED ORDER — PREDNISONE 50 MG PO TABS: 50.0000 mg | ORAL_TABLET | Freq: Every day | ORAL | Status: DC

## 2020-03-31 NOTE — Progress Notes (Signed)
Bilateral lower extremity venous duplex has been completed. Preliminary results can be found in CV Proc through chart review.   03/31/20 9:38 AM Carlos Levering RVT

## 2020-03-31 NOTE — Consult Note (Signed)
NAME:  Tyrone Roman, MRN:  [id number], DOB:  03-10-1945, LOS: 6 ADMISSION DATE:  03/25/2020, CONSULTATION DATE:  03/30/20 REFERRING MD:  Lesia Sago, MD CHIEF COMPLAINT:  Hypotension   Brief History:  75 year old male with hx covid s/p treatment in 02/2020 admitted for hypotension, weakness and decreased PO intake. PCCM consulted for hypotension and persistent fevers.  History of Present Illness:  [name] is a 75 year old male with recent covid hospitalization  He was admitted from 1/24-1/27 for covid pneumonia. He was treated with remdesivir and steroids. Did not receive immunosuppressants since he was already on Rituxan. He was readmitted from 2/9-2/11 for recurrent respiratory symptoms thought to be related to too rapid of a prednisone taper for his covid pneumonia, procalcitonin was negative that admission, had hypoxemia but did not require oxygen at discharge. Readmitted on 2/18 for fever, cough, weakness, decreased PO intake and hypotension. Also reported dysuria. In the ED his vitals were significant for fever, tachycardia and SpO2 90% on room air. CTA negative for PE with interval worsening of diffuse bilateral ground glass opacities, pleural plaques with calcifications.  He has been treated with IVF and Zosyn. He has continued to have persistent fevers despite medical management. Today he was confused and hypotensive with SBP in the 80s. IVF bolus ordered and PCCM was consulted.   On my exam, patient receiving IVF. Mental status improved. Patient was previously disoriented and now oriented x 4. He reports dyspnea has been persistent since his diagnosis of covid in January.   Social history Former smoker. Quit 35 years ago. 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  Micro Data:  Sputum 2/18 > Normal flora BCx 2/18 > S. hominis BCx 2/20>  BCx 2/23 > UCx 2/23 >  Antimicrobials:  Zosyn 2/18> Vanc 2/18> 2/21, 2/23  Interim History / Subjective:  Reports unchanged shortness of breath. No further hypotension overnight.   Objective   Blood pressure 103/76, pulse 71, temperature 98.7 F (37.1 C), temperature source Oral, resp. rate 15, height 6\' 2"  (1.88 m), weight 91 kg, SpO2 93 %.        Intake/Output Summary (Last 24 hours) at 03/31/2020 1447 Last data filed at 03/31/2020 0600 Gross per 24 hour  Intake 804.35 ml  Output 350 ml  Net 454.35 ml   Filed Weights   03/27/20 0500 03/28/20 0500 03/29/20 0601  Weight: 93.4 kg 93.2 kg 91 kg    Physical Exam: General: Well-appearing, no acute distress HENT: [place], AT, OP clear, MMM Eyes: EOMI, no scleral icterus Respiratory: Diminished breath sounds bilaterally.  No crackles, wheezing or rales Cardiovascular: RRR, -M/R/G, no JVD GI: BS+, soft, nontender Extremities:-Edema,-tenderness Neuro: AAO x4, CNII-XII grossly intact Skin: Intact, no rashes or bruising Psych: Normal mood, normal affect  Resolved Hospital Problem list     Assessment & Plan:  Sepsis/septic shock secondary to presumed HCAP - improving Febrile since admission and started on broad spectrum antibiotics. Fever curve does trend in the direction of improvement. Had episode of hypotension and confusion that resolved by the time I arrived. SBP initially in the 80s and after receiving fluid bolus improved to 130s.  Patient is self-aware of his confusion which he believes occurs when he awakens in the hospital and feels disoriented. He is oriented x 4 on interview. No indication for ICU transfer --Continue broad spectrum antibiotics. Would extend to 7-10 day course. De-escalate after 48  hours afebrile --Monitor fever curve  Acute hypoxemic respiratory failure Likely multifactorial from HCAP in setting of prior covid infection. Chest imaging suggests scarring from his last hospitalization that has not fully resolved and may be interfering with his ability to exchange gas. His lung findings do not seem to be typical for rheumatoid arthritis. I also suspect reserve is diminished and may need oxygen at discharge. He also has a significant occupational exposure >30 years that make him susceptible to his recent lung infections. --Wean O2 for goal SpO2 >88% --Continue Dulera TWO puffs TWICE a day --Agree with steroids with plan for taper --Antibiotics as above --Will arrange outpatient Pulmonary follow-up   Plan for PFTs and repeat CT imaging --Ambulatory O2 prior to discharge. In the event he does not need home O2, will need to arrange overnight oximetry on room air   Pulmonary will sign off. Please call for questions or concerns  Best practice (evaluated daily)  Diet: NPO Pain/Anxiety/Delirium protocol (if indicated): -- VAP protocol (if indicated): -- DVT prophylaxis: SubQ GI prophylaxis: PPI Glucose control: Per primary Mobility: As tolerated Disposition: Remain on floor  Goals of Care:  Last date of multidisciplinary goals of care discussion: Per primary Family and staff present: Summary of discussion:  Follow up goals of care discussion due: Per primary Code Status: Full code  Labs   CBC: Recent Labs  Lab 03/27/20 0455 03/28/20 0425 03/29/20 0439 03/30/20 0446 03/31/20 0430  WBC 4.3 3.4* 3.1* 3.7* 2.2*  NEUTROABS 3.5 2.7 2.4 2.9 1.9  HGB 11.7* 10.8* 11.2* 11.7* 10.2*  HCT 33.7* 32.4* 34.1* 34.6* 31.8*  MCV 85.1 86.9 88.6 86.7 89.1  PLT 56* 81* 99* 128* 129*    Basic Metabolic Panel: Recent Labs  Lab 03/27/20 0923 03/28/20 0425 03/29/20 0439 03/30/20 0446 03/31/20 0430  NA 132* 131* 133* 137 137  K 3.9 3.7 3.7 4.5 4.2  CL 101 102 102 103 106   CO2 23 21* 22 24 21*  GLUCOSE 94 106* 97 115* 204*  BUN 15 14 17 20 23   CREATININE 1.22 1.30* 1.32* 1.22 1.10  CALCIUM 8.2* 8.6* 9.0 9.0 8.8*  MG 1.5* 1.6* 1.7 1.8 1.9   GFR: Estimated Creatinine Clearance: 68.5 mL/min (by C-G formula based on SCr of 1.1 mg/dL). Recent Labs  Lab 03/25/20 1451 03/25/20 2038 03/26/20 0149 03/26/20 0847 03/27/20 0455 03/28/20 0425 03/29/20 0439 03/30/20 0446 03/31/20 0430  PROCALCITON  --   --  <0.10  --  <0.10 0.11  --   --   --   WBC 5.9  --  4.9  --  4.3 3.4* 3.1* 3.7* 2.2*  LATICACIDVEN 2.3* 2.6*  --  1.3  --   --   --   --   --     Liver Function Tests: Recent Labs  Lab 03/25/20 1451  AST 51*  ALT 26  ALKPHOS 39  BILITOT 1.1  PROT 5.4*  ALBUMIN 2.3*   No results for input(s): LIPASE, AMYLASE in the last 168 hours. No results for input(s): AMMONIA in the last 168 hours.  ABG    Component Value Date/Time   PHART 7.369 06/08/2009 1024   PCO2ART 40.7 06/08/2009 1024   PO2ART 73.0 (L) 06/08/2009 1024  HCO3 24.5 (H) 06/08/2009 1029   TCO2 24 10/14/2009 2056   ACIDBASEDEF 1.0 06/08/2009 1029   O2SAT 68.0 06/08/2009 1029     Coagulation Profile: Recent Labs  Lab 03/25/20 1451 03/26/20 0149  INR 1.2 1.3*    Cardiac Enzymes: No results for input(s): CKTOTAL, CKMB, CKMBINDEX, TROPONINI in the last 168 hours.  HbA1C: Hgb A1c MFr Bld  Date/Time Value Ref Range Status  05/11/2019 01:34 PM 6.0 4.6 - 6.5 % Final    Comment:    Glycemic Control Guidelines for People with Diabetes:Non Diabetic:  <6%Goal of Therapy: <7%Additional Action Suggested:  >8%     CBG: Recent Labs  Lab 03/27/20 0819 03/28/20 0843 03/29/20 0745 03/30/20 0844 03/31/20 0749  GLUCAP 85 91 92 99 191*   Critical Care Time: N/A    Rodman Pickle, M.D. [facility] Pulmonary/Critical Care Medicine 03/31/2020 2:47 PM

## 2020-03-31 NOTE — TOC Progression Note (Signed)
Transition of Care [facility]) - Progression Note    Patient Details  Name: Tyrone Roman MRN: [id number] Date of Birth: 1946-01-29  Transition of Care [facility]) CM/SW Contact  Purcell Mouton, RN Phone Number: 03/31/2020, 3:42 PM  Clinical Narrative:     Spoke with pt who agreed to going to SNF for Rehab. Will fax to SNF the [facility].   Expected Discharge Plan: [place] Barriers to Discharge: No Barriers Identified  Expected Discharge Plan and Services Expected Discharge Plan: [place] arrangements for the past 2 months: Single Family Home                                       Social Determinants of Health (SDOH) Interventions    Readmission Risk Interventions No flowsheet data found.

## 2020-03-31 NOTE — NC FL2 (Signed)
[place] LEVEL OF CARE SCREENING TOOL     IDENTIFICATION  Patient Name: Tyrone Roman Birthdate: 12/11/1945 Sex: male Admission Date (Current Location): 03/25/2020  [facility] and [place] Number:  Herbalist and Address:  [facility],  [place] [place], [place]      Provider Number: [id number]  Attending Physician Name and Address:  Alma Friendly, MD  Relative Name and Phone Number:  Daughter [name] [phone]    Current Level of Care: Hospital Recommended Level of Care: [place] Prior Approval Number:    Date Approved/Denied:   PASRR Number: [id number]  Discharge Plan: SNF    Current Diagnoses: Patient Active Problem List   Diagnosis Date Noted  . Acute respiratory failure with hypoxia ([place]) 03/28/2020  . Physical deconditioning 03/26/2020  . Normocytic anemia 03/26/2020  . Severe sepsis ([place]) 03/25/2020  . Multifocal pneumonia 03/17/2020  . COPD with acute exacerbation ([place]) 03/17/2020  . Hypoxia 03/17/2020  . CAD (coronary artery disease) 03/17/2020  . Thrombocytopenia ([place]) 03/08/2020  . Stage 3b chronic kidney disease ([place]) 03/08/2020  . Pneumonia due to COVID-19 virus 02/29/2020  . Acute respiratory failure due to COVID-19 ([place]) 02/29/2020  . Closed nondisplaced fracture of shaft of third metacarpal bone of left hand 03/04/2019  . Wound infection after surgery 01/01/2018  . Medication monitoring encounter 01/01/2018  . HNP (herniated nucleus pulposus), lumbar 10/07/2017  . Lumbar herniated disc 10/07/2017  . Psoriasis of scalp 03/27/2016  . Rheumatoid arthritis ([place]) 03/25/2013  . GI bleeding 02/20/2012  . Dyspnea on exertion 09/08/2010  . COPD (chronic obstructive pulmonary disease) ([place]) 08/04/2010  . Bruising 08/04/2010  . TINNITUS 12/16/2009  . Dizziness and giddiness 12/16/2009  . NECK SPRAIN AND STRAIN 10/21/2009  . LUMBAR SPRAIN AND STRAIN 10/21/2009  . Hyperlipidemia  06/10/2009  . Coronary artery disease of native artery of native heart with stable angina pectoris ([place]) 06/10/2009  . GERD 06/10/2009  . BARRETTS ESOPHAGUS 06/10/2009  . CONCUSSION WITH LOC OF 30 MINUTES OR LESS 02/28/2009  . NEPHROLITHIASIS 07/01/2007  . CONTACT DERMATITIS 12/12/2006  . BPH (benign prostatic hyperplasia) 11/04/2006  . COLONIC POLYPS 10/21/2003  . DIVERTICULOSIS, COLON 10/21/2003    Orientation RESPIRATION BLADDER Height & Weight     Self,Time,Place,Situation  O2 (2L O2 [place]) Incontinent,External catheter Weight: 91 kg Height:  6\' 2"  (188 cm)  BEHAVIORAL SYMPTOMS/MOOD NEUROLOGICAL BOWEL NUTRITION STATUS      Incontinent Diet (Regular)  AMBULATORY STATUS COMMUNICATION OF NEEDS Skin   Extensive Assist Verbally Other (Comment) (Ecchymosis abdomen, bilateral arms)                       Personal Care Assistance Level of Assistance  Bathing,Feeding,Dressing Bathing Assistance: Maximum assistance Feeding assistance: Independent Dressing Assistance: Limited assistance     Functional Limitations Info  Sight,Hearing,Speech Sight Info: Impaired Hearing Info: Impaired Speech Info: Adequate    SPECIAL CARE FACTORS FREQUENCY  PT (By licensed PT),OT (By licensed OT)     PT Frequency: x5 week OT Frequency: x5 week            Contractures Contractures Info: Not present    Additional Factors Info  Code Status,Allergies Code Status Info: FULL Allergies Info: Cephalexin, Clarithromycin, Certolizumab Pegol, Tocilizumab, Doxycycline, Hydroxyzine, Lidoderm, Pyrithione Zinc           Current Medications (03/31/2020):  This is the current hospital active medication list Current Facility-Administered Medications  Medication Dose Route Frequency Provider  Last Rate Last Admin  . (feeding supplement) PROSource Plus liquid 30 mL  30 mL Oral BID BM Dwyane Dee, MD   30 mL at 03/31/20 1229  . 0.9 %  sodium chloride infusion  250 mL Intravenous PRN Shahmehdi, Seyed  A, MD      . 0.9 %  sodium chloride infusion   Intravenous Continuous Alma Friendly, MD 75 mL/hr at 03/31/20 1030 Rate Change at 03/31/20 1030  . acetaminophen (TYLENOL) tablet 650 mg  650 mg Oral Q4H PRN Dwyane Dee, MD   650 mg at 03/30/20 1056  . benzonatate (TESSALON) capsule 100 mg  100 mg Oral TID Skipper Cliche A, MD   100 mg at 03/31/20 1024  . bisacodyl (DULCOLAX) EC tablet 5 mg  5 mg Oral Daily PRN Shahmehdi, Seyed A, MD   5 mg at 03/27/20 1046  . feeding supplement (BOOST / RESOURCE BREEZE) liquid 1 Container  1 Container Oral Q24H Dwyane Dee, MD   1 Container at 03/29/20 1705  . feeding supplement (ENSURE ENLIVE / ENSURE PLUS) liquid 237 mL  237 mL Oral BID BM Dwyane Dee, MD   237 mL at 03/31/20 1026  . heparin injection 5,000 Units  5,000 Units Subcutaneous Q8H Shahmehdi, Seyed A, MD   5,000 Units at 03/31/20 1228  . hydrALAZINE (APRESOLINE) injection 10 mg  10 mg Intravenous Q4H PRN Shahmehdi, Seyed A, MD      . ipratropium (ATROVENT) nebulizer solution 0.5 mg  0.5 mg Nebulization Q6H PRN Shahmehdi, Seyed A, MD      . levalbuterol (XOPENEX) nebulizer solution 0.63 mg  0.63 mg Nebulization Q6H PRN Shahmehdi, Seyed A, MD      . methylPREDNISolone sodium succinate (SOLU-MEDROL) 125 mg/2 mL injection 60 mg  60 mg Intravenous Q12H Alma Friendly, MD   60 mg at 03/31/20 0528  . mometasone-formoterol (DULERA) 200-5 MCG/ACT inhaler 2 puff  2 puff Inhalation BID Margaretha Seeds, MD   2 puff at 03/31/20 1030  . ondansetron (ZOFRAN) tablet 4 mg  4 mg Oral Q6H PRN Shahmehdi, Seyed A, MD       Or  . ondansetron (ZOFRAN) injection 4 mg  4 mg Intravenous Q6H PRN Shahmehdi, Seyed A, MD   4 mg at 03/29/20 1701  . pantoprazole (PROTONIX) EC tablet 40 mg  40 mg Oral Daily Shahmehdi, Seyed A, MD   40 mg at 03/31/20 1024  . piperacillin-tazobactam (ZOSYN) IVPB 3.375 g  3.375 g Intravenous Lenise Arena, MD 12.5 mL/hr at 03/31/20 1029 3.375 g at 03/31/20 1029  .  senna-docusate (Senokot-S) tablet 1 tablet  1 tablet Oral QHS PRN Shahmehdi, Seyed A, MD      . simvastatin (ZOCOR) tablet 40 mg  40 mg Oral q1800 Shahmehdi, Seyed A, MD   40 mg at 03/30/20 1508  . sodium chloride flush (NS) 0.9 % injection 3 mL  3 mL Intravenous Q12H Shahmehdi, Seyed A, MD   3 mL at 03/30/20 2112  . sodium chloride flush (NS) 0.9 % injection 3 mL  3 mL Intravenous Q12H Shahmehdi, Seyed A, MD   3 mL at 03/31/20 1025  . sodium chloride flush (NS) 0.9 % injection 3 mL  3 mL Intravenous PRN Shahmehdi, Seyed A, MD      . vancomycin (VANCOREADY) IVPB 2000 mg/400 mL  2,000 mg Intravenous Q24H Bell, Michelle T, RPH      . zolpidem (AMBIEN) tablet 5 mg  5 mg Oral QHS PRN Deatra James, MD  Discharge Medications: Please see discharge summary for a list of discharge medications.  Relevant Imaging Results:  Relevant Lab Results:   Additional Information [phone]  Purcell Mouton, RN

## 2020-03-31 NOTE — Plan of Care (Signed)
°  Problem: Fluid Volume: °Goal: Hemodynamic stability will improve °Outcome: Progressing °  °Problem: Clinical Measurements: °Goal: Diagnostic test results will improve °Outcome: Progressing °Goal: Signs and symptoms of infection will decrease °Outcome: Progressing °  °

## 2020-03-31 NOTE — Progress Notes (Signed)
Called lab to inquire on  results of GI panel, spoke with Ovid Curd, who reports, results may take up to 3 days to results. SRP, RN

## 2020-03-31 NOTE — Care Management Important Message (Signed)
Important Message  Patient Details IM Letter placed in Patient's door caddy. Name: Tyrone Roman MRN: [id number] Date of Birth: [birth year]   Medicare Important Message Given:  Yes     Kerin Salen 03/31/2020, 10:51 AM

## 2020-03-31 NOTE — Progress Notes (Signed)
Pt 02 decreased to 1.0 liter, tolerate well. O2 sat 94-96% on 1 liter. Dyspnea on exertion. Pt up in chair for several hours during the shift. SRP, RN

## 2020-03-31 NOTE — Progress Notes (Signed)
PROGRESS NOTE    Tyrone Roman   RKY:[id number]  DOB: 1945/05/25  DOA: 03/25/2020     6  PCP: [name], MD  CC: [facility] Course: Mr. Mccubbins is a 75 yo male with PMH CAD, HTN, HLD, RA, BPH, COPD (not on chronic O2) and recent COVID-19 infection (02/29/20, s/p Remdesivir/steroids) followed by hospitalziation 2/10 - 2/12 for SOB/hypoxia. He has been relatively immobile at home due to ongoing shortness of breath and has not been doing much other than laying in bed.  He again presents back to the ER with his ongoing shortness of breath as well as fevers, chills, cough productive of brown sputum. He underwent CXR in the ER which was notable for bilateral opacities concerning for worsening pneumonia.  Lactic was mildly elevated, procalcitonin was negative. He was saturating well on room air and did not require oxygen. He was placed on antibiotics and received IV fluids for presumed sepsis due to pneumonia. Repeat Covid PCR was again positive on 03/25/2020. Due to ongoing oxygen demand, immobility at home, and worsening feeling of shortness of breath as well as elevated inflammatory markers, he underwent CTA chest on 03/26/2020.  Slight motion artifact however negative for PE.  Worsening groundglass opacities appreciated. Blood cultures from admission were also positive for 1/4 bottles with MRSE. Cultures were re-drawn and he was continued on abx for coverage due to potential for decline.   Interval History:  Patient seems to be more alert today, although intermittently confused which is kind of his baseline since his Covid infection.  Denies any new complaints   Assessment & Plan: Severe sepsis Fever, tachycardia, tachypnea, lactic acidosis.  Source presumed lung at this time Noted to spike temp to 101 on 03/29/2020  Repeat blood culture x 2 NGTD, urine culture pending Procalcitonin (negative x 2) Continue zosyn, vancomycin, plan to de-escalate if no fever in 48 hours PCCM consulted,  appreciate recs, signed off  Acute respiratory failure with hypoxia Requiring O2, 2L Previously treated with remdesivir and steroids in January during Covid infection Continue oxygen and wean as able Continue Solu-Medrol 3 days and continue with p.o. prednisone and plan to taper Supplemental O2 as needed  Pneumonia due to COVID-19 virus s/p treatment previously with remdesivir Continue breathing treatments, IS, and supportive care  CTA chest negative for PE, shows worsening groundglass opacity, repeat CXR with worsening multifocal pneumonia Given concern for superimposed bacterial infeciton, continue antibiotics as above Continue Solu-Medrol as mentioned above Bilateral lower extremity Doppler negative for DVT Patient allergic to Actemra  Hypotension Resolving Likely due to ?sepsis Continue IV fluids  Acute metabolic encephalopathy Likely multifactorial most likely due to pneumonia CT head unremarkable for any acute intracranial abnormalities Further management as above  Diarrhea Resolving Loose, not watery ?antibiotic related GI panel pending Monitor closely  Physical deconditioning Considered due to underlying recent COVID PT eval, rec SNF  Stage 3b chronic kidney disease Baseline creat ~ 1.4 - 1.6, eGFR 48-50  Thrombocytopenia  Baseline appears 60-90k Daily CBC  COPD Continue routine care  BPH Hold Flomax due to hypotension  GERD Continue PPI  Coronary artery disease of native artery of native heart with stable angina pectoris (HCC) Currently chest pain free  Hyperlipidemia Continue statin  GOC discussion Patient with very poor prognosis due to multiple comorbidities Discussed extensively with daughter about CODE STATUS, full code for now Palliative consult    Antimicrobials: Vanc 2/18>> 03/29/2019 Zosyn 2/19>>current Aztreonam 2/18>>2/19  DVT prophylaxis: heparin injection 5,000 Units Start:  03/25/20 2200   Code Status:   Code  Status: Full Code Family Communication: Discussed with daughter Ms [name] on 03/31/20  Disposition Plan: Status is: Inpatient  Remains inpatient appropriate because:IV treatments appropriate due to intensity of illness or inability to take PO and Inpatient level of care appropriate due to severity of illness   Dispo: The patient is from: Home              Anticipated d/c is to: SNF              Anticipated d/c date is: 3 days              Patient currently is not medically stable to d/c.   Difficult to place patient No    Objective: Blood pressure 103/76, pulse 71, temperature 98.7 F (37.1 C), temperature source Oral, resp. rate 15, height 6' 2" (1.88 m), weight 91 kg, SpO2 93 %.   Examination:  General: NAD, deconditioned  Cardiovascular: S1, S2 present  Respiratory: Coarse BS bilaterally  Abdomen: Soft, nontender, nondistended, bowel sounds present  Musculoskeletal: No bilateral pedal edema noted  Skin: Normal  Psychiatry: Normal mood   Consultants:   PCCM  Procedures:   None  Data Reviewed: I have personally reviewed following labs and imaging studies Results for orders placed or performed during the hospital encounter of 03/25/20 (from the past 24 hour(s))  CBC with Differential/Platelet     Status: Abnormal   Collection Time: 03/31/20  4:30 AM  Result Value Ref Range   WBC 2.2 (L) 4.0 - 10.5 K/uL   RBC 3.57 (L) 4.22 - 5.81 MIL/uL   Hemoglobin 10.2 (L) 13.0 - 17.0 g/dL   HCT 31.8 (L) 39.0 - 52.0 %   MCV 89.1 80.0 - 100.0 fL   MCH 28.6 26.0 - 34.0 pg   MCHC 32.1 30.0 - 36.0 g/dL   RDW 14.9 11.5 - 15.5 %   Platelets 129 (L) 150 - 400 K/uL   nRBC 0.0 0.0 - 0.2 %   Neutrophils Relative % 85 %   Neutro Abs 1.9 1.7 - 7.7 K/uL   Lymphocytes Relative 10 %   Lymphs Abs 0.2 (L) 0.7 - 4.0 K/uL   Monocytes Relative 3 %   Monocytes Absolute 0.1 0.1 - 1.0 K/uL   Eosinophils Relative 0 %   Eosinophils Absolute 0.0 0.0 - 0.5 K/uL   Basophils Relative 1 %    Basophils Absolute 0.0 0.0 - 0.1 K/uL   Immature Granulocytes 1 %   Abs Immature Granulocytes 0.03 0.00 - 0.07 K/uL  Magnesium     Status: None   Collection Time: 03/31/20  4:30 AM  Result Value Ref Range   Magnesium 1.9 1.7 - 2.4 mg/dL  C-reactive protein     Status: Abnormal   Collection Time: 03/31/20  4:30 AM  Result Value Ref Range   CRP 17.0 (H) <1.0 mg/dL  Lactate dehydrogenase     Status: Abnormal   Collection Time: 03/31/20  4:30 AM  Result Value Ref Range   LDH 345 (H) 98 - 192 U/L  D-dimer, quantitative     Status: Abnormal   Collection Time: 03/31/20  4:30 AM  Result Value Ref Range   D-Dimer, Quant 1.60 (H) 0.00 - 0.50 ug/mL-FEU  Basic metabolic panel     Status: Abnormal   Collection Time: 03/31/20  4:30 AM  Result Value Ref Range   Sodium 137 135 - 145 mmol/L   Potassium 4.2 3.5 - 5.1  mmol/L   Chloride 106 98 - 111 mmol/L   CO2 21 (L) 22 - 32 mmol/L   Glucose, Bld 204 (H) 70 - 99 mg/dL   BUN 23 8 - 23 mg/dL   Creatinine, Ser 1.10 0.61 - 1.24 mg/dL   Calcium 8.8 (L) 8.9 - 10.3 mg/dL   GFR, Estimated >60 >60 mL/min   Anion gap 10 5 - 15  Glucose, capillary     Status: Abnormal   Collection Time: 03/31/20  7:49 AM  Result Value Ref Range   Glucose-Capillary 191 (H) 70 - 99 mg/dL    Recent Results (from the past 240 hour(s))  Urine culture     Status: Abnormal   Collection Time: 03/25/20  2:36 PM   Specimen: In/Out Cath Urine  Result Value Ref Range Status   Specimen Description   Final    IN/OUT CATH URINE Performed at [facility], [place] [address], [place], [place] [address]    Special Requests   Final    NONE Performed at [facility], [place] [address] [place], [place] [address]    Culture MULTIPLE SPECIES PRESENT, SUGGEST RECOLLECTION (A)  Final   Report Status 03/27/2020 FINAL  Final  Blood Culture (routine x 2)     Status: None   Collection Time: 03/25/20  4:45 PM   Specimen: BLOOD LEFT HAND  Result Value Ref  Range Status   Specimen Description   Final    BLOOD LEFT HAND Performed at [place] [address], [place], [place] [address]    Special Requests   Final    BOTTLES DRAWN AEROBIC AND ANAEROBIC Blood Culture adequate volume Performed at [place] [address] [place], [place] [address]    Culture   Final    NO GROWTH 5 DAYS Performed at [facility], [place] [address], [place], [place] [address]    Report Status 03/30/2020 FINAL  Final  Blood Culture (routine x 2)     Status: Abnormal   Collection Time: 03/25/20  4:45 PM   Specimen: BLOOD LEFT FOREARM  Result Value Ref Range Status   Specimen Description   Final    BLOOD LEFT FOREARM Performed at [place] [address], [place], [place] [address]    Special Requests   Final    BOTTLES DRAWN AEROBIC AND ANAEROBIC Blood Culture adequate volume Performed at [place] [address] [place], [place] [address]    Culture  Setup Time   Final    GRAM POSITIVE COCCI IN CLUSTERS AEROBIC BOTTLE ONLY CRITICAL RESULT CALLED TO, READ BACK BY AND VERIFIED WITH: A PHAM PHARMD _0  03/26/20 EB    Culture (A)  Final    STAPHYLOCOCCUS HOMINIS THE SIGNIFICANCE OF ISOLATING THIS ORGANISM FROM A SINGLE SET OF BLOOD CULTURES WHEN MULTIPLE SETS ARE DRAWN IS UNCERTAIN. PLEASE NOTIFY THE MICROBIOLOGY DEPARTMENT WITHIN ONE WEEK IF SPECIATION AND SENSITIVITIES ARE REQUIRED. Performed at [facility], [place] [address], [place],  [address]    Report Status 03/28/2020 FINAL  Final  Blood Culture ID Panel (Reflexed)     Status: Abnormal   Collection Time: 03/25/20  4:45 PM  Result Value Ref Range Status   Enterococcus faecalis NOT DETECTED NOT DETECTED Final   Enterococcus Faecium NOT DETECTED NOT DETECTED Final   Listeria monocytogenes NOT DETECTED NOT DETECTED Final   Staphylococcus species DETECTED (A) NOT DETECTED Final    Comment:  CRITICAL RESULT CALLED TO, READ BACK BY AND VERIFIED WITH: A PHAM PHARMD _0  03/26/20 EB    Staphylococcus aureus (BCID) NOT DETECTED NOT DETECTED Final   Staphylococcus epidermidis DETECTED (A) NOT DETECTED Final    Comment: Methicillin (oxacillin) resistant coagulase negative staphylococcus. Possible blood culture contaminant (unless isolated from more than one blood culture draw or clinical case suggests pathogenicity). No antibiotic treatment is indicated for blood  culture contaminants. CRITICAL RESULT CALLED TO, READ BACK BY AND VERIFIED WITH: A PHAM PHARMD _1  03/26/20 EB    Staphylococcus lugdunensis NOT DETECTED NOT DETECTED Final   Streptococcus species NOT DETECTED NOT DETECTED Final   Streptococcus agalactiae NOT DETECTED NOT DETECTED Final   Streptococcus pneumoniae NOT DETECTED NOT DETECTED Final   Streptococcus pyogenes NOT DETECTED NOT DETECTED Final   A.calcoaceticus-baumannii NOT DETECTED NOT DETECTED Final   Bacteroides fragilis NOT DETECTED NOT DETECTED Final   Enterobacterales NOT DETECTED NOT DETECTED Final   Enterobacter cloacae complex NOT DETECTED NOT DETECTED Final   Escherichia coli NOT DETECTED NOT DETECTED Final   Klebsiella aerogenes NOT DETECTED NOT DETECTED Final   Klebsiella oxytoca NOT DETECTED NOT DETECTED Final   Klebsiella pneumoniae NOT DETECTED NOT DETECTED Final   Proteus species NOT DETECTED NOT DETECTED Final   Salmonella species NOT DETECTED NOT DETECTED Final   Serratia marcescens NOT DETECTED NOT DETECTED Final   Haemophilus influenzae NOT DETECTED NOT DETECTED Final   Neisseria meningitidis NOT DETECTED NOT DETECTED Final   Pseudomonas aeruginosa NOT DETECTED NOT DETECTED Final   Stenotrophomonas maltophilia NOT DETECTED NOT DETECTED Final   Candida albicans NOT DETECTED NOT DETECTED Final   Candida auris NOT DETECTED NOT DETECTED Final   Candida glabrata NOT DETECTED NOT DETECTED Final   Candida krusei NOT DETECTED NOT DETECTED Final    Candida parapsilosis NOT DETECTED NOT DETECTED Final   Candida tropicalis NOT DETECTED NOT DETECTED Final   Cryptococcus neoformans/gattii NOT DETECTED NOT DETECTED Final   Methicillin resistance mecA/C DETECTED (A) NOT DETECTED Final    Comment: CRITICAL RESULT CALLED TO, READ BACK BY AND VERIFIED WITH: A PHAM PHARMD _2  03/26/20 EB Performed at [facility], [address] [place], [place] [address]   Resp Panel by RT-PCR (Flu A&B, Covid) Nasopharyngeal Swab     Status: Abnormal   Collection Time: 03/25/20  5:47 PM   Specimen: Nasopharyngeal Swab; Nasopharyngeal(NP) swabs in vial transport medium  Result Value Ref Range Status   SARS Coronavirus 2 by RT PCR POSITIVE (A) NEGATIVE Final    Comment: RESULT CALLED TO, READ BACK BY AND VERIFIED WITH: C SIMPSON AT 1852 ON 03/25/2020 BY JPM (NOTE) SARS-CoV-2 target nucleic acids are DETECTED.  The SARS-CoV-2 RNA is generally detectable in upper respiratory specimens during the acute phase of infection. Positive results are indicative of the presence of the identified virus, but do not rule out bacterial infection or co-infection with other pathogens not detected by the test. Clinical correlation with patient history and other diagnostic information is necessary to determine patient infection status. The expected result is Negative.  Fact Sheet for Patients: EntrepreneurPulse.com.au  Fact Sheet for Healthcare Providers: IncredibleEmployment.be  This test is not yet approved or cleared by the [place] FDA and  has been authorized for detection and/or diagnosis of SARS-CoV-2 by FDA under an Emergency Use Authorization (EUA).  This EUA will remain in effect (meaning this test ca n be used) for the duration of  the COVID-19 declaration under Section 564(b)(1) of the Act, 21  U.S.C. section 360bbb-3(b)(1), unless the authorization is terminated or revoked sooner.     Influenza A by PCR  NEGATIVE NEGATIVE Final   Influenza B by PCR NEGATIVE NEGATIVE Final    Comment: (NOTE) The Xpert Xpress SARS-CoV-2/FLU/RSV plus assay is intended as an aid in the diagnosis of influenza from Nasopharyngeal swab specimens and should not be used as a sole basis for treatment. Nasal washings and aspirates are unacceptable for Xpert Xpress SARS-CoV-2/FLU/RSV testing.  Fact Sheet for Patients: EntrepreneurPulse.com.au  Fact Sheet for Healthcare Providers: IncredibleEmployment.be  This test is not yet approved or cleared by the [place] FDA and has been authorized for detection and/or diagnosis of SARS-CoV-2 by FDA under an Emergency Use Authorization (EUA). This EUA will remain in effect (meaning this test can be used) for the duration of the COVID-19 declaration under Section 564(b)(1) of the Act, 21 U.S.C. section 360bbb-3(b)(1), unless the authorization is terminated or revoked.  Performed at [facility], [place] [address] [place], [place] [address]   Culture, sputum-assessment     Status: None   Collection Time: 03/25/20  8:38 PM   Specimen: Expectorated Sputum  Result Value Ref Range Status   Specimen Description EXPECTORATED SPUTUM  Final   Special Requests NONE  Final   Sputum evaluation   Final    THIS SPECIMEN IS ACCEPTABLE FOR SPUTUM CULTURE Performed at [facility], [place] [address], [place], [place] [address]    Report Status 03/25/2020 FINAL  Final  MRSA PCR Screening     Status: None   Collection Time: 03/25/20  8:38 PM   Specimen: Nasal Mucosa; Nasopharyngeal  Result Value Ref Range Status   MRSA by PCR NEGATIVE NEGATIVE Final    Comment:        The GeneXpert MRSA Assay (FDA approved for NASAL specimens only), is one component of a comprehensive MRSA colonization surveillance program. It is not intended to diagnose MRSA infection nor to guide or monitor treatment for MRSA  infections. Performed at [facility], [place] [address], [place], [place] [address]   Culture, Respiratory w Gram Stain     Status: None   Collection Time: 03/25/20  8:38 PM  Result Value Ref Range Status   Specimen Description   Final    EXPECTORATED SPUTUM Performed at [place] [address] [place], [place] [address]    Special Requests   Final    NONE Reflexed from [id number] Performed at [place] [address] [place], [place] [address]    Gram Stain   Final    NO WBC SEEN FEW SQUAMOUS EPITHELIAL CELLS PRESENT ABUNDANT GRAM POSITIVE COCCI ABUNDANT GRAM NEGATIVE RODS MODERATE GRAM POSITIVE RODS    Culture   Final    ABUNDANT Consistent with normal respiratory flora. No Pseudomonas species isolated Performed at [place] [address], [place], [place] [address]    Report Status 03/28/2020 FINAL  Final  Culture, blood (routine x 2)     Status: None (Preliminary result)   Collection Time: 03/27/20  9:23 AM   Specimen: BLOOD RIGHT HAND  Result Value Ref Range Status   Specimen Description   Final    BLOOD RIGHT HAND Performed at [facility], [place] [address], [place],  [address]    Special Requests   Final    BOTTLES DRAWN AEROBIC ONLY Blood Culture results may not be optimal due to an inadequate volume of blood received  in culture bottles Performed at [facility], [place] [address] [place], [place] [address]    Culture   Final    NO GROWTH 4 DAYS Performed at [facility], [place] [address] [place], [place] [address]    Report Status PENDING  Incomplete  Culture, blood (routine x 2)     Status: None (Preliminary result)   Collection Time: 03/27/20  9:23 AM   Specimen: BLOOD LEFT HAND  Result Value Ref Range Status   Specimen Description   Final    BLOOD LEFT HAND Performed at [facility], [place] [address], [place], [place] [address]    Special  Requests   Final    BOTTLES DRAWN AEROBIC AND ANAEROBIC Blood Culture results may not be optimal due to an inadequate volume of blood received in culture bottles Performed at [place] [address], [place], [place] [address]    Culture   Final    NO GROWTH 4 DAYS Performed at [facility], [place] [address], [place], [place] [address]    Report Status PENDING  Incomplete  Culture, blood (routine x 2)     Status: None (Preliminary result)   Collection Time: 03/30/20  1:29 PM   Specimen: BLOOD RIGHT HAND  Result Value Ref Range Status   Specimen Description   Final    BLOOD RIGHT HAND Performed at [place] [address] [place], [place] [address]    Special Requests   Final    BOTTLES DRAWN AEROBIC ONLY Blood Culture results may not be optimal due to an inadequate volume of blood received in culture bottles Performed at [place] [address] [place], [place] [address]    Culture   Final    NO GROWTH 1 DAY Performed at [facility], [place] [address], [place], [place] [address]    Report Status PENDING  Incomplete  Culture, blood (routine x 2)     Status: None (Preliminary result)   Collection Time: 03/30/20  1:29 PM   Specimen: BLOOD RIGHT HAND  Result Value Ref Range Status   Specimen Description   Final    BLOOD RIGHT HAND Performed at [place] [address], [place], [place] [address]    Special Requests   Final    BOTTLES DRAWN AEROBIC AND ANAEROBIC Blood Culture adequate volume Performed at [place] [address], [place], [place] [address]    Culture   Final    NO GROWTH 1 DAY Performed at [facility], [place] [address], [place], [place] [address]    Report Status PENDING  Incomplete     Radiology Studies: CT HEAD WO CONTRAST  Result Date: 03/30/2020 CLINICAL DATA:  Mental status changes of unknown etiology. Coronavirus  pneumonia. EXAM: CT HEAD WITHOUT CONTRAST TECHNIQUE: Contiguous axial images were obtained from the base of the skull through the vertex without intravenous contrast. COMPARISON:  MRI 12/13/2017 FINDINGS: Brain: No change since prior appearances. Mild age related volume loss. No sign of acute infarction, mass lesion, hemorrhage, hydrocephalus or extra-axial collection. Incidental mega cisterna magna. Vascular: There is atherosclerotic calcification of the major vessels at the base of the brain. Skull: Negative Sinuses/Orbits: Clear/normal Other: None IMPRESSION: No acute or reversible finding. Mild age related volume loss. Atherosclerotic calcification of the major vessels at the base of the brain. Electronically Signed   By: Nelson Chimes M.D.   On:  03/30/2020 19:15   DG Chest Port 1 View  Result Date: 03/30/2020 CLINICAL DATA:  Fever.  History of COPD. EXAM: PORTABLE CHEST 1 VIEW COMPARISON:  CT chest 03/26/2020. Single-view of the chest 03/25/2020. FINDINGS: Hazy multifocal airspace disease has worsened since the prior plain film. Heart size is normal. No pneumothorax or pleural effusion. Emphysema again noted. No acute or focal bony abnormality. IMPRESSION: Worsened multifocal pneumonia. Electronically Signed   By: Inge Rise M.D.   On: 03/30/2020 14:11   VAS [place] LOWER EXTREMITY VENOUS (DVT)  Result Date: 03/31/2020  Lower Venous DVT Study Indications: Elevated Ddimer.  Risk Factors: COVID 19 positive. Comparison Study: No prior studies. Performing Technologist: Oliver Hum RVT  Examination Guidelines: A complete evaluation includes B-mode imaging, spectral Doppler, color Doppler, and power Doppler as needed of all accessible portions of each vessel. Bilateral testing is considered an integral part of a complete examination. Limited examinations for reoccurring indications may be performed as noted. The reflux portion of the exam is performed with the patient in reverse Trendelenburg.   +---------+---------------+---------+-----------+----------+--------------+ RIGHT    CompressibilityPhasicitySpontaneityPropertiesThrombus Aging +---------+---------------+---------+-----------+----------+--------------+ CFV      Full           Yes      Yes                                 +---------+---------------+---------+-----------+----------+--------------+ SFJ      Full                                                        +---------+---------------+---------+-----------+----------+--------------+ FV Prox  Full                                                        +---------+---------------+---------+-----------+----------+--------------+ FV Mid   Full                                                        +---------+---------------+---------+-----------+----------+--------------+ FV DistalFull                                                        +---------+---------------+---------+-----------+----------+--------------+ PFV      Full                                                        +---------+---------------+---------+-----------+----------+--------------+ POP      Full           Yes      Yes                                 +---------+---------------+---------+-----------+----------+--------------+  PTV      Full                                                        +---------+---------------+---------+-----------+----------+--------------+ PERO     Full                                                        +---------+---------------+---------+-----------+----------+--------------+   +---------+---------------+---------+-----------+----------+--------------+ LEFT     CompressibilityPhasicitySpontaneityPropertiesThrombus Aging +---------+---------------+---------+-----------+----------+--------------+ CFV      Full           Yes      Yes                                  +---------+---------------+---------+-----------+----------+--------------+ SFJ      Full                                                        +---------+---------------+---------+-----------+----------+--------------+ FV Prox  Full                                                        +---------+---------------+---------+-----------+----------+--------------+ FV Mid   Full                                                        +---------+---------------+---------+-----------+----------+--------------+ FV DistalFull                                                        +---------+---------------+---------+-----------+----------+--------------+ PFV      Full                                                        +---------+---------------+---------+-----------+----------+--------------+ POP      Full           Yes      Yes                                 +---------+---------------+---------+-----------+----------+--------------+ PTV      Full                                                        +---------+---------------+---------+-----------+----------+--------------+  PERO     Full                                                        +---------+---------------+---------+-----------+----------+--------------+     Summary: RIGHT: - There is no evidence of deep vein thrombosis in the lower extremity.  - No cystic structure found in the popliteal fossa.  LEFT: - There is no evidence of deep vein thrombosis in the lower extremity.  - No cystic structure found in the popliteal fossa.  *See table(s) above for measurements and observations.    Preliminary    VAS [place] LOWER EXTREMITY VENOUS (DVT)      CT HEAD WO CONTRAST  Final Result    DG Chest Port 1 View  Final Result    CT ANGIO CHEST PE W OR WO CONTRAST  Final Result    DG Chest Port 1 View  Final Result      Scheduled Meds: . (feeding supplement) PROSource Plus  30 mL Oral BID BM  .  benzonatate  100 mg Oral TID  . feeding supplement  1 Container Oral Q24H  . feeding supplement  237 mL Oral BID BM  . heparin  5,000 Units Subcutaneous Q8H  . methylPREDNISolone (SOLU-MEDROL) injection  60 mg Intravenous Q12H  . mometasone-formoterol  2 puff Inhalation BID  . pantoprazole  40 mg Oral Daily  . simvastatin  40 mg Oral q1800  . sodium chloride flush  3 mL Intravenous Q12H  . sodium chloride flush  3 mL Intravenous Q12H   PRN Meds: sodium chloride, acetaminophen **OR** [DISCONTINUED] acetaminophen, bisacodyl, hydrALAZINE, ipratropium, levalbuterol, ondansetron **OR** ondansetron (ZOFRAN) IV, senna-docusate, sodium chloride flush, zolpidem Continuous Infusions: . sodium chloride    . sodium chloride 75 mL/hr at 03/31/20 1030  . piperacillin-tazobactam (ZOSYN)  IV 3.375 g (03/31/20 1631)  . vancomycin 2,000 mg (03/31/20 1642)     LOS: 6 days    Alma Friendly, MD [facility] 03/31/2020, 5:05 PM

## 2020-04-01 ENCOUNTER — Inpatient Hospital Stay: Payer: Medicare Other | Admitting: Family Medicine

## 2020-04-01 DIAGNOSIS — J189 Pneumonia, unspecified organism: Secondary | ICD-10-CM | POA: Diagnosis not present

## 2020-04-01 DIAGNOSIS — Z800 Family history of malignant neoplasm of digestive organs: Secondary | ICD-10-CM | POA: Diagnosis not present

## 2020-04-01 DIAGNOSIS — I491 Atrial premature depolarization: Secondary | ICD-10-CM | POA: Diagnosis not present

## 2020-04-01 DIAGNOSIS — R279 Unspecified lack of coordination: Secondary | ICD-10-CM | POA: Diagnosis not present

## 2020-04-01 DIAGNOSIS — R7989 Other specified abnormal findings of blood chemistry: Secondary | ICD-10-CM | POA: Diagnosis not present

## 2020-04-01 DIAGNOSIS — M069 Rheumatoid arthritis, unspecified: Secondary | ICD-10-CM | POA: Diagnosis present

## 2020-04-01 DIAGNOSIS — R1084 Generalized abdominal pain: Secondary | ICD-10-CM | POA: Diagnosis not present

## 2020-04-01 DIAGNOSIS — M0579 Rheumatoid arthritis with rheumatoid factor of multiple sites without organ or systems involvement: Secondary | ICD-10-CM

## 2020-04-01 DIAGNOSIS — I2510 Atherosclerotic heart disease of native coronary artery without angina pectoris: Secondary | ICD-10-CM | POA: Diagnosis present

## 2020-04-01 DIAGNOSIS — E785 Hyperlipidemia, unspecified: Secondary | ICD-10-CM | POA: Diagnosis present

## 2020-04-01 DIAGNOSIS — Z79899 Other long term (current) drug therapy: Secondary | ICD-10-CM | POA: Diagnosis not present

## 2020-04-01 DIAGNOSIS — J1282 Pneumonia due to coronavirus disease 2019: Secondary | ICD-10-CM | POA: Diagnosis not present

## 2020-04-01 DIAGNOSIS — I130 Hypertensive heart and chronic kidney disease with heart failure and stage 1 through stage 4 chronic kidney disease, or unspecified chronic kidney disease: Secondary | ICD-10-CM | POA: Diagnosis present

## 2020-04-01 DIAGNOSIS — Z8042 Family history of malignant neoplasm of prostate: Secondary | ICD-10-CM | POA: Diagnosis not present

## 2020-04-01 DIAGNOSIS — Z8249 Family history of ischemic heart disease and other diseases of the circulatory system: Secondary | ICD-10-CM | POA: Diagnosis not present

## 2020-04-01 DIAGNOSIS — I25118 Atherosclerotic heart disease of native coronary artery with other forms of angina pectoris: Secondary | ICD-10-CM | POA: Diagnosis not present

## 2020-04-01 DIAGNOSIS — T1490XA Injury, unspecified, initial encounter: Secondary | ICD-10-CM | POA: Diagnosis not present

## 2020-04-01 DIAGNOSIS — J690 Pneumonitis due to inhalation of food and vomit: Secondary | ICD-10-CM | POA: Diagnosis present

## 2020-04-01 DIAGNOSIS — Z881 Allergy status to other antibiotic agents status: Secondary | ICD-10-CM | POA: Diagnosis not present

## 2020-04-01 DIAGNOSIS — A419 Sepsis, unspecified organism: Secondary | ICD-10-CM | POA: Diagnosis not present

## 2020-04-01 DIAGNOSIS — Z743 Need for continuous supervision: Secondary | ICD-10-CM | POA: Diagnosis not present

## 2020-04-01 DIAGNOSIS — G9341 Metabolic encephalopathy: Secondary | ICD-10-CM | POA: Diagnosis present

## 2020-04-01 DIAGNOSIS — U071 COVID-19: Secondary | ICD-10-CM | POA: Diagnosis not present

## 2020-04-01 DIAGNOSIS — R6520 Severe sepsis without septic shock: Secondary | ICD-10-CM | POA: Diagnosis not present

## 2020-04-01 DIAGNOSIS — R5381 Other malaise: Secondary | ICD-10-CM | POA: Diagnosis not present

## 2020-04-01 DIAGNOSIS — Z20828 Contact with and (suspected) exposure to other viral communicable diseases: Secondary | ICD-10-CM | POA: Diagnosis not present

## 2020-04-01 DIAGNOSIS — N400 Enlarged prostate without lower urinary tract symptoms: Secondary | ICD-10-CM | POA: Diagnosis present

## 2020-04-01 DIAGNOSIS — Z833 Family history of diabetes mellitus: Secondary | ICD-10-CM | POA: Diagnosis not present

## 2020-04-01 DIAGNOSIS — N1832 Chronic kidney disease, stage 3b: Secondary | ICD-10-CM | POA: Diagnosis present

## 2020-04-01 DIAGNOSIS — T50904A Poisoning by unspecified drugs, medicaments and biological substances, undetermined, initial encounter: Secondary | ICD-10-CM | POA: Diagnosis not present

## 2020-04-01 DIAGNOSIS — Z8371 Family history of colonic polyps: Secondary | ICD-10-CM | POA: Diagnosis not present

## 2020-04-01 DIAGNOSIS — J439 Emphysema, unspecified: Secondary | ICD-10-CM | POA: Diagnosis not present

## 2020-04-01 DIAGNOSIS — R4182 Altered mental status, unspecified: Secondary | ICD-10-CM | POA: Diagnosis not present

## 2020-04-01 DIAGNOSIS — M6259 Muscle wasting and atrophy, not elsewhere classified, multiple sites: Secondary | ICD-10-CM | POA: Diagnosis not present

## 2020-04-01 DIAGNOSIS — Z808 Family history of malignant neoplasm of other organs or systems: Secondary | ICD-10-CM | POA: Diagnosis not present

## 2020-04-01 DIAGNOSIS — I252 Old myocardial infarction: Secondary | ICD-10-CM | POA: Diagnosis not present

## 2020-04-01 DIAGNOSIS — J449 Chronic obstructive pulmonary disease, unspecified: Secondary | ICD-10-CM | POA: Diagnosis present

## 2020-04-01 DIAGNOSIS — Z8616 Personal history of COVID-19: Secondary | ICD-10-CM | POA: Diagnosis not present

## 2020-04-01 DIAGNOSIS — R000 Tachycardia, unspecified: Secondary | ICD-10-CM | POA: Diagnosis not present

## 2020-04-01 DIAGNOSIS — R0602 Shortness of breath: Secondary | ICD-10-CM | POA: Diagnosis not present

## 2020-04-01 DIAGNOSIS — N200 Calculus of kidney: Secondary | ICD-10-CM | POA: Diagnosis not present

## 2020-04-01 DIAGNOSIS — Z888 Allergy status to other drugs, medicaments and biological substances status: Secondary | ICD-10-CM | POA: Diagnosis not present

## 2020-04-01 DIAGNOSIS — J9601 Acute respiratory failure with hypoxia: Secondary | ICD-10-CM | POA: Diagnosis not present

## 2020-04-01 DIAGNOSIS — R404 Transient alteration of awareness: Secondary | ICD-10-CM | POA: Diagnosis not present

## 2020-04-01 DIAGNOSIS — E1165 Type 2 diabetes mellitus with hyperglycemia: Secondary | ICD-10-CM | POA: Diagnosis not present

## 2020-04-01 DIAGNOSIS — Z87891 Personal history of nicotine dependence: Secondary | ICD-10-CM | POA: Diagnosis not present

## 2020-04-01 DIAGNOSIS — D696 Thrombocytopenia, unspecified: Secondary | ICD-10-CM | POA: Diagnosis not present

## 2020-04-01 DIAGNOSIS — Z8261 Family history of arthritis: Secondary | ICD-10-CM | POA: Diagnosis not present

## 2020-04-01 DIAGNOSIS — I5032 Chronic diastolic (congestive) heart failure: Secondary | ICD-10-CM | POA: Diagnosis present

## 2020-04-01 DIAGNOSIS — R0902 Hypoxemia: Secondary | ICD-10-CM | POA: Diagnosis not present

## 2020-04-01 LAB — CBC WITH DIFFERENTIAL/PLATELET
Abs Immature Granulocytes: 0.04 10*3/uL (ref 0.00–0.07)
Basophils Absolute: 0 10*3/uL (ref 0.0–0.1)
Basophils Relative: 0 %
Eosinophils Absolute: 0 10*3/uL (ref 0.0–0.5)
Eosinophils Relative: 0 %
HCT: 31.1 % — ABNORMAL LOW (ref 39.0–52.0)
Hemoglobin: 10.4 g/dL — ABNORMAL LOW (ref 13.0–17.0)
Immature Granulocytes: 1 %
Lymphocytes Relative: 7 %
Lymphs Abs: 0.3 10*3/uL — ABNORMAL LOW (ref 0.7–4.0)
MCH: 29.3 pg (ref 26.0–34.0)
MCHC: 33.4 g/dL (ref 30.0–36.0)
MCV: 87.6 fL (ref 80.0–100.0)
Monocytes Absolute: 0.2 10*3/uL (ref 0.1–1.0)
Monocytes Relative: 5 %
Neutro Abs: 3.4 10*3/uL (ref 1.7–7.7)
Neutrophils Relative %: 87 %
Platelets: 162 10*3/uL (ref 150–400)
RBC: 3.55 MIL/uL — ABNORMAL LOW (ref 4.22–5.81)
RDW: 14.8 % (ref 11.5–15.5)
WBC: 4 10*3/uL (ref 4.0–10.5)
nRBC: 0 % (ref 0.0–0.2)

## 2020-04-01 LAB — CULTURE, BLOOD (ROUTINE X 2)
Culture: NO GROWTH
Culture: NO GROWTH

## 2020-04-01 LAB — BASIC METABOLIC PANEL
Anion gap: 10 (ref 5–15)
BUN: 32 mg/dL — ABNORMAL HIGH (ref 8–23)
CO2: 20 mmol/L — ABNORMAL LOW (ref 22–32)
Calcium: 8.7 mg/dL — ABNORMAL LOW (ref 8.9–10.3)
Chloride: 107 mmol/L (ref 98–111)
Creatinine, Ser: 1.1 mg/dL (ref 0.61–1.24)
GFR, Estimated: >60 mL/min (ref >60)
Glucose, Bld: 319 mg/dL — ABNORMAL HIGH (ref 70–99)
Potassium: 3.5 mmol/L (ref 3.5–5.1)
Sodium: 137 mmol/L (ref 135–145)

## 2020-04-01 LAB — GLUCOSE, CAPILLARY
Glucose-Capillary: 254 mg/dL — ABNORMAL HIGH (ref 70–99)
Glucose-Capillary: 318 mg/dL — ABNORMAL HIGH (ref 70–99)
Glucose-Capillary: 297 mg/dL — ABNORMAL HIGH (ref 70–99)
Glucose-Capillary: 269 mg/dL — ABNORMAL HIGH (ref 70–99)

## 2020-04-01 LAB — URINE CULTURE: Culture: NO GROWTH

## 2020-04-01 LAB — HEMOGLOBIN A1C
Hgb A1c MFr Bld: 7.7 % — ABNORMAL HIGH (ref 4.8–5.6)
Mean Plasma Glucose: 174.29 mg/dL

## 2020-04-01 MED ORDER — ENSURE ENLIVE PO LIQD: 237.0000 mL | Freq: Two times a day (BID) | ORAL | 12 refills | Status: DC

## 2020-04-01 MED ORDER — AMOXICILLIN-POT CLAVULANATE 875-125 MG PO TABS: 1.0000 | ORAL_TABLET | Freq: Two times a day (BID) | ORAL | 0 refills | Status: DC

## 2020-04-01 MED ORDER — INSULIN ASPART 100 UNIT/ML ~~LOC~~ SOLN
0.0000 [IU] | Freq: Three times a day (TID) | SUBCUTANEOUS | Status: DC
  Administered 2020-04-01 (×2): 5 [IU] via SUBCUTANEOUS
  Administered 2020-04-01: 7 [IU] via SUBCUTANEOUS

## 2020-04-01 MED ORDER — BENZONATATE 100 MG PO CAPS: 100.0000 mg | ORAL_CAPSULE | Freq: Three times a day (TID) | ORAL | 0 refills | Status: DC

## 2020-04-01 MED ORDER — MOMETASONE FURO-FORMOTEROL FUM 200-5 MCG/ACT IN AERO: 2.0000 | INHALATION_SPRAY | Freq: Two times a day (BID) | RESPIRATORY_TRACT | Status: DC

## 2020-04-01 MED ORDER — INSULIN ASPART 100 UNIT/ML ~~LOC~~ SOLN
0.0000 [IU] | Freq: Every day | SUBCUTANEOUS | Status: DC
  Administered 2020-04-01: 3 [IU] via SUBCUTANEOUS

## 2020-04-01 MED ORDER — PROSOURCE PLUS PO LIQD: 30.0000 mL | Freq: Two times a day (BID) | ORAL | Status: DC

## 2020-04-01 MED ORDER — PREDNISONE 10 MG PO TABS: ORAL_TABLET | ORAL | 0 refills | Status: DC

## 2020-04-01 NOTE — TOC Progression Note (Signed)
Transition of Care [facility]) - Progression Note    Patient Details  Name: Tyrone Roman MRN: [id number] Date of Birth: 25-Apr-1945  [facility]) CM/SW Contact  Purcell Mouton, RN Phone Number: 04/01/2020, 1:19 PM  Clinical Narrative:    Spoke with pt's daughter [name] this am, 1st Choice was [facility]. However after [name] spoke with her sister's [facility] was selected. [name] agreed of pt discharging to [facility] today.   Expected Discharge Plan: [place] Barriers to Discharge: No Barriers Identified  Expected Discharge Plan and Services Expected Discharge Plan: [place] arrangements for the past 2 months: Single Family Home                                       Social Determinants of Health (SDOH) Interventions    Readmission Risk Interventions No flowsheet data found.

## 2020-04-01 NOTE — Progress Notes (Signed)
NUTRITION NOTE  Full assessment done by this RD remotely on 2/20. Discharge order and discharge summary for d/c to SNF entered in the chart earlier this afternoon.   Able to confirm with RN that patient has a bed at SNF and that he will be discharging today.  If patient is unable to d/c today. RD will follow-up on 2/28.     Jarome Matin, MS, RD, LDN, CNSC Inpatient Clinical Dietitian RD pager # available in [place]  After hours/weekend pager # available in [facility]

## 2020-04-01 NOTE — Progress Notes (Signed)
Physical Therapy Treatment Patient Details Name: Tyrone Roman MRN: [id number] DOB: 10-23-1945 Today's Date: 04/01/2020    History of Present Illness Tyrone Roman is a 75 y.o. male with medical history significant of CAD, HLD, HTN, RA, BPH, COPD , MI, recent Covid infection and Covid pneumonia diagnosis 02/29/20, hospitalized again 2/10-2/12, returns to hospital reporting since his discharge due to has not felt better has been progressively getting worse with fever, chills, cough and SOB.    PT Comments    The patient remains confused as to time and situation. Patient requires 2 persons to safely ambulate x 8', noted  Legs very weak and unsteady. Patient will [facility] for rehab.  Patient's SPO2 on RA 94%. HR 108/  Follow Up Recommendations  SNF     Equipment Recommendations  None recommended by PT    Recommendations for Other Services       Precautions / Restrictions Precautions Precautions: Fall Precaution Comments: monitor O2, Airborn/Contact, enteric Restrictions Weight Bearing Restrictions: No    Mobility  Bed Mobility Overal bed mobility: Needs Assistance Bed Mobility: Supine to Sit     Supine to sit: Min guard;HOB elevated     General bed mobility comments: used bed rails, extra time and effort, cueing to come to full EOB position.    Transfers Overall transfer level: Needs assistance Equipment used: Rolling walker (2 wheeled) Transfers: Sit to/from Stand Sit to Stand: Min assist;+2 physical assistance;+2 safety/equipment         General transfer comment: to/from EOB and to recliner. Cues for technique. Min A +2 to steady. BLE wekaness noted. Utilized rw.  Ambulation/Gait Ambulation/Gait assistance: Mod assist;+2 safety/equipment;+2 physical assistance Gait Distance (Feet): 8 Feet Assistive device: Rolling walker (2 wheeled) Gait Pattern/deviations: Step-to pattern Gait velocity: decr   General Gait Details: patient's steps shuffling ,knees  flexed, very unsteady. marched in place x 10 reps   Stairs             Wheelchair Mobility    Modified Rankin (Stroke Patients Only)       Balance Overall balance assessment: Needs assistance Sitting-balance support: Feet supported Sitting balance-Leahy Scale: Fair Sitting balance - Comments: seated EOB   Standing balance support: During functional activity Standing balance-Leahy Scale: Poor Standing balance comment: reliant on external support                            Cognition Arousal/Alertness: Awake/alert Behavior During Therapy: WFL for tasks assessed/performed;Flat affect Overall Cognitive Status: Impaired/Different from baseline Area of Impairment: Orientation;Memory;Safety/judgement;Awareness                 Orientation Level: Place;Time   Memory: Decreased short-term memory   Safety/Judgement: Decreased awareness of safety Awareness: Emergent   General Comments: Pt asked a few times where he is at and whether he is suppose to be staying here overnight or commute from home for therapy. Cofabulation noted at times. Tangential responses and some verbal perseveration noted.      Exercises      General Comments        Pertinent Vitals/Pain Pain Assessment: Faces Faces Pain Scale: Hurts a little bit Pain Location: back pain Pain Descriptors / Indicators: Constant;Sore Pain Intervention(s): Monitored during session    Home Living                      Prior Function  PT Goals (current goals can now be found in the care plan section) Acute Rehab PT Goals Patient Stated Goal: get back to normal Progress towards PT goals: Progressing toward goals    Frequency    Min 2X/week      PT Plan Current plan remains appropriate    Co-evaluation PT/OT/SLP Co-Evaluation/Treatment: Yes Reason for Co-Treatment: For patient/therapist safety PT goals addressed during session: Mobility/safety with mobility OT  goals addressed during session: ADL's and self-care      AM-PAC PT "6 Clicks" Mobility   Outcome Measure  Help needed turning from your back to your side while in a flat bed without using bedrails?: A Little Help needed moving from lying on your back to sitting on the side of a flat bed without using bedrails?: A Little Help needed moving to and from a bed to a chair (including a wheelchair)?: A Lot Help needed standing up from a chair using your arms (e.g., wheelchair or bedside chair)?: A Lot Help needed to walk in hospital room?: A Lot Help needed climbing 3-5 steps with a railing? : Total 6 Click Score: 13    End of Session Equipment Utilized During Treatment: Gait belt;Oxygen Activity Tolerance: Patient limited by fatigue Patient left: in chair;with call bell/phone within reach;with chair alarm set Nurse Communication: Mobility status PT Visit Diagnosis: Unsteadiness on feet (R26.81);Other abnormalities of gait and mobility (R26.89);Muscle weakness (generalized) (M62.81)     Time: 0881-1031 PT Time Calculation (min) (ACUTE ONLY): 34 min  Charges:  $Therapeutic Activity: 8-22 mins                     Tresa Endo PT Acute Rehabilitation Services Pager [phone] Office [phone]    Claretha Cooper 04/01/2020, 2:25 PM

## 2020-04-01 NOTE — Progress Notes (Signed)
Occupational Therapy Treatment Patient Details Name: Tyrone Roman MRN: [id number] DOB: 1945-05-22 Today's Date: 04/01/2020    History of present illness Tyrone Roman is a 75 y.o. male with medical history significant of CAD, HLD, HTN, RA, BPH, COPD , MI, recent Covid infection and Covid pneumonia diagnosis 02/29/20, hospitalized again 2/10-2/12, returns to hospital reporting since his discharge due to has not felt better has been progressively getting worse with fever, chills, cough and SOB.   OT comments  Pt progressing towards acute OT goals. Walked half the distance to the bathroom with +2 min - mod A and chair follow utilized. BLE weakness and unsteadiness remain but was able to progress his walking distance today towards his goal of walking to the bathroom to toilet. Pt received on 1L O2 but able to complete OOB activity on RA with O2 sat 94 after walk. DOE 2/4. D/c plan remains appropriate.   Follow Up Recommendations  SNF    Equipment Recommendations  None recommended by OT    Recommendations for Other Services      Precautions / Restrictions Precautions Precautions: Fall Precaution Comments: monitor O2, Airborn/Contact, enteric Restrictions Weight Bearing Restrictions: No       Mobility Bed Mobility Overal bed mobility: Needs Assistance Bed Mobility: Supine to Sit     Supine to sit: Min guard;HOB elevated     General bed mobility comments: used bed rails, extra time and effort, cueing to come to full EOB position.    Transfers Overall transfer level: Needs assistance Equipment used: Rolling walker (2 wheeled) Transfers: Sit to/from Stand Sit to Stand: Min assist;+2 physical assistance;+2 safety/equipment         General transfer comment: to/from EOB and to recliner. Cues for technique. Min A +2 to steady. BLE wekaness noted. Utilized rw.    Balance Overall balance assessment: Needs assistance Sitting-balance support: Feet supported Sitting  balance-Leahy Scale: Fair Sitting balance - Comments: seated EOB   Standing balance support: During functional activity Standing balance-Leahy Scale: Poor Standing balance comment: reliant on external support                           ADL either performed or assessed with clinical judgement   ADL Overall ADL's : Needs assistance/impaired                         Toilet Transfer: Moderate assistance;+2 for safety/equipment Toilet Transfer Details (indicate cue type and reason): Pt able to ambulate half the distance to the bathroom this session with min-mod A and chair follow needed. O2 94 post ambulation on RA (received on 1L)         Functional mobility during ADLs: Minimal assistance;Moderate assistance;Rolling walker;+2 for safety/equipment       Vision       Perception     Praxis      Cognition Arousal/Alertness: Awake/alert Behavior During Therapy: WFL for tasks assessed/performed;Flat affect Overall Cognitive Status: Impaired/Different from baseline Area of Impairment: Orientation;Memory;Safety/judgement;Awareness                 Orientation Level: Place;Time   Memory: Decreased short-term memory   Safety/Judgement: Decreased awareness of safety Awareness: Emergent   General Comments: Pt asked a few times where he is at and whether he is suppose to be staying here overnight or commute from home for therapy. Cofabulation noted at times. Tangential responses and some verbal perseveration noted.  Exercises     Shoulder Instructions       General Comments      Pertinent Vitals/ Pain       Pain Assessment: Faces Faces Pain Scale: Hurts a little bit Pain Location: back pain Pain Descriptors / Indicators: Constant;Sore Pain Intervention(s): Monitored during session;Repositioned  Home Living                                          Prior Functioning/Environment              Frequency  Min 2X/week         Progress Toward Goals  OT Goals(current goals can now be found in the care plan section)  Progress towards OT goals: Progressing toward goals  Acute Rehab OT Goals Patient Stated Goal: get back to normal OT Goal Formulation: With patient Time For Goal Achievement: 04/13/20 Potential to Achieve Goals: Fair ADL Goals Pt Will Perform Grooming: standing;with min guard assist Pt Will Perform Lower Body Dressing: sit to/from stand;with min guard assist Pt Will Transfer to Toilet: with min guard assist;regular height toilet;ambulating;grab bars Pt Will Perform Toileting - Clothing Manipulation and hygiene: with min guard assist;sit to/from stand Additional ADL Goal #1: Pt will engage in at least 5 min standing functional activities without loss of balance and with SpO2 at 90% or above, in order to demonstrate improved activity tolerance and balance needed to perform ADLs safely at home. Additional ADL Goal #2: Pt will identify at least 3 energy conservation techniques to avoid relapse and rehospitalization.  Plan Discharge plan remains appropriate    Co-evaluation    PT/OT/SLP Co-Evaluation/Treatment: Yes Reason for Co-Treatment: For patient/therapist safety   OT goals addressed during session: ADL's and self-care;Proper use of [employer] equipment and DME      AM-PAC OT "6 Clicks" Daily Activity     Outcome Measure   Help from another person eating meals?: None Help from another person taking care of personal grooming?: A Little Help from another person toileting, which includes using toliet, bedpan, or urinal?: A Lot Help from another person bathing (including washing, rinsing, drying)?: A Little Help from another person to put on and taking off regular upper body clothing?: A Little Help from another person to put on and taking off regular lower body clothing?: A Lot 6 Click Score: 17    End of Session Equipment Utilized During Treatment: Gait belt;Rolling walker  OT  Visit Diagnosis: History of falling (Z91.81);Unsteadiness on feet (R26.81);Muscle weakness (generalized) (M62.81)   Activity Tolerance Patient limited by fatigue;Other (comment) (DOE 2/4)   Patient Left in chair;with chair alarm set;with call bell/phone within reach   Nurse Communication Mobility status        Time: [phone] OT Time Calculation (min): 29 min  Charges: OT General Charges $OT Visit: 1 Visit OT Treatments $Self Care/Home Management : 8-22 mins  Tyrone Schimke, OT Acute Rehabilitation Services Pager: [phone] Office: [phone]    Tyrone Roman 04/01/2020, 11:23 AM

## 2020-04-01 NOTE — Progress Notes (Signed)
AVS given to PTAR and explained over the phone with the receiving nurse at [facility]. Medications and follow up appointments have been explained with pt's nurse verbalizing understanding.

## 2020-04-01 NOTE — Progress Notes (Signed)
[facility] to take pt to snf.  Pt dressed in personal clothing.  Report called by Mia [place], RN on dayshift.  Packet is complete.  PTAR will take when they are leaving the unit.

## 2020-04-01 NOTE — TOC Progression Note (Signed)
Transition of Care [facility]) - Progression Note    Patient Details  Name: Tyrone Roman MRN: [id number] Date of Birth: 31-Aug-1945  Transition of Care [facility]) CM/SW Contact  Purcell Mouton, RN Phone Number: 04/01/2020, 1:08 PM  Clinical Narrative:    Daughter Tyrone Roman agreed with [facility] for SNF. [facility] made bed offer.    Expected Discharge Plan: [place] Barriers to Discharge: No Barriers Identified  Expected Discharge Plan and Services Expected Discharge Plan: [place] arrangements for the past 2 months: Single Family Home                                       Social Determinants of Health (SDOH) Interventions    Readmission Risk Interventions No flowsheet data found.

## 2020-04-01 NOTE — Discharge Summary (Signed)
Discharge Summary  DIAR BERKEL UVO:[id number] DOB: 1945/12/14  PCP: [name], MD  Admit date: 03/25/2020 Discharge date: 04/01/2020  Time spent: 40 mins  Recommendations for Outpatient Follow-up:  1. PCP as scheduled 2. Pulmonary as scheduled  Discharge Diagnoses:  Active Hospital Problems   Diagnosis Date Noted   Severe sepsis ([place]) 03/25/2020   Acute respiratory failure with hypoxia (HCC) 03/28/2020   Physical deconditioning 03/26/2020   Normocytic anemia 03/26/2020   Stage 3b chronic kidney disease ([place]) 03/08/2020   Thrombocytopenia ([place]) 03/08/2020   Pneumonia due to COVID-19 virus 02/29/2020   Rheumatoid arthritis ([place]) 03/25/2013   COPD (chronic obstructive pulmonary disease) ([place]) 08/04/2010   Hyperlipidemia 06/10/2009   Coronary artery disease of native artery of native heart with stable angina pectoris ([place]) 06/10/2009   GERD 06/10/2009   BPH (benign prostatic hyperplasia) 11/04/2006    Resolved Hospital Problems   Diagnosis Date Noted Date Resolved   Contamination of blood culture 03/28/2020 03/28/2020    Discharge Condition: Stable  Diet recommendation: As tolerated  Vitals:   04/01/20 1029 04/01/20 1151  BP:  116/75  Pulse:  85  Resp:  16  Temp:  97.7 F (36.5 C)  SpO2: 94% 92%    History of present illness:  [name] is a 75 yo male with PMH CAD, HTN, HLD, RA, BPH, COPD (not on chronic O2) and recent COVID-19 infection (02/29/20, s/p Remdesivir/steroids) followed by hospitalziation 2/10 - 2/12 for SOB/hypoxia. He has been relatively immobile at home due to ongoing shortness of breath and has not been doing much other than laying in bed. He again presents back to the ER with his ongoing shortness of breath as well as fevers, chills, cough productive of brown sputum. He underwent CXR in the ER which was notable for bilateral opacities concerning for worsening pneumonia. Lactic was mildly elevated, procalcitonin was negative. He  was saturating well on room air and did not require oxygen. He was placed on antibiotics and received IV fluids for presumed sepsis due to pneumonia. Repeat Covid PCR was again positive on 03/25/2020. Due to ongoing oxygen demand, immobility at home, and worsening feeling of shortness of breath as well as elevated inflammatory markers, he underwent CTA chest on 03/26/2020. Slight motion artifact however negative for PE. Worsening groundglass opacities appreciated. Blood cultures from admission were also positive for 1/4 bottles with MRSE. Cultures were re-drawn and he was continued on abx for coverage due to potential for decline.  Further hospital course as stated below.     Today, patient denies any new complaints, met patient sitting up eating breakfast, denied any worsening shortness of breath, chest pain, abdominal pain, nausea/vomiting, fever/chills.  Patient weaned off of oxygen and saturating well.  Patient stable to be discharged to SNF for further rehab needs.  Discussed discharge plans with daughter on 04/01/2020.     Hospital Course:  Principal Problem:   Severe sepsis ([place]) Active Problems:   Hyperlipidemia   Coronary artery disease of native artery of native heart with stable angina pectoris (HCC)   GERD   BPH (benign prostatic hyperplasia)   COPD (chronic obstructive pulmonary disease) (HCC)   Rheumatoid arthritis (HCC)   Pneumonia due to COVID-19 virus   Thrombocytopenia (HCC)   Stage 3b chronic kidney disease (HCC)   Physical deconditioning   Normocytic anemia   Acute respiratory failure with hypoxia (HCC)   Severe sepsis likely 2/2 multifocal pneumonia Fever, tachycardia, tachypnea, lactic acidosis. Source presumed lung at this time Noted  to spike temp to 101 and hypotensive on 03/29/2020, has remained afebrile since then, BP stable Repeat blood culture x 2 NGTD, urine culture  no growth Procalcitonin(negative x 2) S/P zosyn + vancomycin Switch to p.o. Augmentin  for another 3 days to complete 10 days total of antibiotics  Acute respiratory failure with hypoxia Initially required O2, 2L, currently weaned off, saturating >90% on room air Previously treated with remdesivir and steroids in January during Covid infection S/P Solu-Medrol X 3 days---> start tapered dose of prednisone over 10 days Follow-up with pulmonology as an outpatient  Pneumonia due to COVID-19 virus Currently on room air, saturating well s/p treatment previously with remdesivir Continue breathing treatments, IS, and supportive care CTA chest negative for PE, shows worsening groundglass opacity, repeat CXR with worsening multifocal pneumonia Bilateral lower extremity Doppler negative for DVT Follow-up with pulmonology as an outpatient  Hyperglycemia Likely 2/2 steroid use Check CBGs before meals and at bedtime, may need sliding scale initially until prednisone completed  Acute metabolic encephalopathy Improved Likely multifactorial most likely due to pneumonia CT head unremarkable for any acute intracranial abnormalities  Physical deconditioning Considered due to underlying recent COVID PT eval, rec SNF  Stage 3b chronic kidney disease Baseline creat ~1.4 - 1.6, [id number]  Thrombocytopenia  Baseline appears 60-90k  COPD Continue routine care  BPH Continue Flomax  GERD Continue PPI  Coronary artery disease of native artery of native heart with stable angina pectoris (HCC) Currently chest pain free  Hyperlipidemia Continue statin       Malnutrition Type:  Nutrition Problem: Increased nutrient needs Etiology: acute illness,catabolic illness ([id number] infection)   Malnutrition Characteristics:  Signs/Symptoms: estimated needs   Nutrition Interventions:  Interventions: Boost Breeze,Prostat,Ensure Enlive (each supplement provides 350kcal and 20 grams of protein)   Estimated body mass index is 25.76 kg/m as calculated from the  following:   Height as of this encounter: $RemoveBeforeD'6\' 2"'YtvgVhNpHDktNP$  (1.88 m).   Weight as of this encounter: 91 kg.    Procedures:  None  Consultations:  PCCM  Discharge Exam: BP 116/75 (BP Location: Right Arm)    Pulse 85    Temp 97.7 F (36.5 C) (Oral)    Resp 16    Ht $R'6\' 2"'vV$  (1.88 m)    Wt 91 kg    SpO2 92%    BMI 25.76 kg/m   General: NAD Cardiovascular: S1, S2 present Respiratory: Coarse breath sounds bilaterally    Discharge Instructions You were cared for by a hospitalist during your hospital stay. If you have any questions about your discharge medications or the care you received while you were in the hospital after you are discharged, you can call the unit and asked to speak with the hospitalist on call if the hospitalist that took care of you is not available. Once you are discharged, your primary care physician will handle any further medical issues. Please note that NO REFILLS for any discharge medications will be authorized once you are discharged, as it is imperative that you return to your primary care physician (or establish a relationship with a primary care physician if you do not have one) for your aftercare needs so that they can reassess your need for medications and monitor your lab values.  Discharge Instructions    Diet - low sodium heart healthy   Complete by: As directed    Increase activity slowly   Complete by: As directed      Allergies as of 04/01/2020      Reactions  Cephalexin Shortness Of Breath, Rash, Other (See Comments)   Tolerated Augmentin 2015 and 2017 (12-2017). Tolerated nafcillin 12/2017 PATIENT HAS HAD A PCN REACTION WITH IMMEDIATE RASH, FACIAL/TONGUE/THROAT SWELLING, SOB, OR LIGHTHEADEDNESS WITH HYPOTENSION:  #  #  YES  #  #  Has patient had a PCN reaction causing severe rash involving mucus membranes or skin necrosis: No Has patient had a PCN reaction that required hospitalization: No Has patient had a PCN reaction occurring within the last 10 years:  No If all of the above answers are "NO", then may proceed   Clarithromycin Shortness Of Breath, Rash   Certolizumab Pegol Hives   Tocilizumab Hives   Doxycycline Rash   Hydroxyzine Rash   Lidoderm Other (See Comments)   "made me act weird"   Pyrithione Zinc Rash      Medication List    STOP taking these medications   potassium chloride SA 20 MEQ tablet Commonly known as: Klor-Con M20   sildenafil 20 MG tablet Commonly known as: REVATIO     TAKE these medications   (feeding supplement) PROSource Plus liquid Take 30 mLs by mouth 2 (two) times daily between meals.   feeding supplement Liqd Take 237 mLs by mouth 2 (two) times daily between meals.   acetaminophen 325 MG tablet Commonly known as: TYLENOL Take 650 mg by mouth every 6 (six) hours as needed for headache.   albuterol 108 (90 Base) MCG/ACT inhaler Commonly known as: Ventolin HFA Inhale 2 puffs into the lungs every 4 (four) hours as needed for wheezing or shortness of breath.   amoxicillin-clavulanate 875-125 MG tablet Commonly known as: Augmentin Take 1 tablet by mouth 2 (two) times daily for 3 days.   benzonatate 100 MG capsule Commonly known as: TESSALON Take 1 capsule (100 mg total) by mouth 3 (three) times daily.   metoprolol succinate 25 MG 24 hr tablet Commonly known as: TOPROL-XL Take 1 tablet (25 mg total) by mouth daily.   mometasone-formoterol 200-5 MCG/ACT Aero Commonly known as: DULERA Inhale 2 puffs into the lungs 2 (two) times daily.   nitroGLYCERIN 0.4 MG SL tablet Commonly known as: NITROSTAT Place 1 tablet (0.4 mg total) under the tongue every 5 (five) minutes as needed for chest pain (3 doses max). What changed: reasons to take this   omeprazole 40 MG capsule Commonly known as: PRILOSEC Take 1 capsule (40 mg total) by mouth daily.   predniSONE 10 MG tablet Commonly known as: DELTASONE Take 5 tablets (50 mg total) by mouth daily for 2 days, THEN 4 tablets (40 mg total) daily for 2  days, THEN 3 tablets (30 mg total) daily for 2 days, THEN 2 tablets (20 mg total) daily for 2 days, THEN 1 tablet (10 mg total) daily for 2 days. Start taking on: April 01, 2020 What changed: See the new instructions.   Rituxan 500 MG/50ML injection Generic drug: riTUXimab Inject into the vein. Every 4 months   simvastatin 40 MG tablet Commonly known as: ZOCOR TAKE ONE TABLET BY MOUTH DAILY What changed: when to take this   tamsulosin 0.4 MG Caps capsule Commonly known as: FLOMAX Take 2 capsules (0.8 mg total) by mouth daily. What changed: how much to take   zolpidem 10 MG tablet Commonly known as: AMBIEN TAKE ONE TABLET BY MOUTH EVERY NIGHT AT BEDTIME What changed:   when to take this  reasons to take this      Allergies  Allergen Reactions   Cephalexin Shortness Of Breath, Rash  and Other (See Comments)    Tolerated Augmentin 2015 and 2017 (12-2017). Tolerated nafcillin 12/2017 PATIENT HAS HAD A PCN REACTION WITH IMMEDIATE RASH, FACIAL/TONGUE/THROAT SWELLING, SOB, OR LIGHTHEADEDNESS WITH HYPOTENSION:  #  #  YES  #  #  Has patient had a PCN reaction causing severe rash involving mucus membranes or skin necrosis: No Has patient had a PCN reaction that required hospitalization: No Has patient had a PCN reaction occurring within the last 10 years: No If all of the above answers are "NO", then may proceed   Clarithromycin Shortness Of Breath and Rash   Certolizumab Pegol Hives   Tocilizumab Hives   Doxycycline Rash   Hydroxyzine Rash   Lidoderm Other (See Comments)    "made me act weird"   Pyrithione Zinc Rash    Contact information for follow-up providers    [name], MD. Schedule an appointment as soon as possible for a visit in 1 week(s).   Specialty: Family Medicine Contact information: [place] [address] [phone]        Margaretha Seeds, MD Follow up.   Specialty: Pulmonary Disease Why: They will call for a  follow up appointment Contact information: [address] [address] [phone]            Contact information for after-discharge care    Destination    [facility] Preferred SNF .   Service: Skilled Nursing Contact information: [place] [phone]                   The results of significant diagnostics from this hospitalization (including imaging, microbiology, ancillary and laboratory) are listed below for reference.    Significant Diagnostic Studies: DG Chest 2 View  Result Date: 03/16/2020 CLINICAL DATA:  Chest pain, COVID-19 positive. EXAM: CHEST - 2 VIEW COMPARISON:  February 29, 2020. FINDINGS: The heart size and mediastinal contours are within normal limits. No pneumothorax or pleural effusion is noted. Multiple patchy airspace opacities are noted bilaterally consistent with multifocal pneumonia. The visualized skeletal structures are unremarkable. IMPRESSION: Bilateral multifocal pneumonia. Electronically Signed   By: Marijo Conception M.D.   On: 03/16/2020 13:58   CT HEAD WO CONTRAST  Result Date: 03/30/2020 CLINICAL DATA:  Mental status changes of unknown etiology. Coronavirus pneumonia. EXAM: CT HEAD WITHOUT CONTRAST TECHNIQUE: Contiguous axial images were obtained from the base of the skull through the vertex without intravenous contrast. COMPARISON:  MRI 12/13/2017 FINDINGS: Brain: No change since prior appearances. Mild age related volume loss. No sign of acute infarction, mass lesion, hemorrhage, hydrocephalus or extra-axial collection. Incidental mega cisterna magna. Vascular: There is atherosclerotic calcification of the major vessels at the base of the brain. Skull: Negative Sinuses/Orbits: Clear/normal Other: None IMPRESSION: No acute or reversible finding. Mild age related volume loss. Atherosclerotic calcification of the major vessels at the base of the brain. Electronically Signed   By:  Nelson Chimes M.D.   On: 03/30/2020 19:15   CT ANGIO CHEST PE W OR WO CONTRAST  Result Date: 03/26/2020 CLINICAL DATA:  Shortness of breath.  COVID positive. EXAM: CT ANGIOGRAPHY CHEST WITH CONTRAST TECHNIQUE: Multidetector CT imaging of the chest was performed using the standard protocol during bolus administration of intravenous contrast. Multiplanar CT image reconstructions and MIPs were obtained to evaluate the vascular anatomy. CONTRAST:  119mL OMNIPAQUE IOHEXOL 350 MG/ML SOLN COMPARISON:  03/16/2020 FINDINGS: Cardiovascular: Examination is limited by respiratory motion artifact.  Within that limitation, there is no central pulmonary embolus. The size of the main pulmonary artery is normal. Normal heart size with coronary artery calcification. The course and caliber of the aorta are normal. There is mild atherosclerotic calcification. Opacification decreased due to pulmonary arterial phase contrast bolus timing. Mediastinum/Nodes: -- No mediastinal lymphadenopathy. -- No hilar lymphadenopathy. -- No axillary lymphadenopathy. -- No supraclavicular lymphadenopathy. -- Normal thyroid gland where visualized. -  Unremarkable esophagus. Lungs/Pleura: There are trace bilateral pleural effusions. There are diffuse bilateral ground-glass airspace opacities. Emphysematous changes are suspected at the lung apices. There are pleural based calcified and noncalcified plaques. Overall, the airspace disease has worsened since the prior study. There is no pneumothorax. The trachea is unremarkable. There are trace bilateral pleural effusions. Upper Abdomen: Contrast bolus timing is not optimized for evaluation of the abdominal organs. The visualized portions of the organs of the upper abdomen are normal. Musculoskeletal: No chest wall abnormality. No bony spinal canal stenosis. Review of the MIP images confirms the above findings. IMPRESSION: 1. Examination is limited by respiratory motion artifact. Within that limitation,  there is no central pulmonary embolus. 2. Worsening bilateral ground-glass airspace opacities, consistent with the patient's history of viral pneumonia. 3. Trace bilateral pleural effusions. 4. Calcified and noncalcified pleural plaques, consistent with prior asbestos exposure. Aortic Atherosclerosis (ICD10-I70.0) and Emphysema (ICD10-J43.9). Electronically Signed   By: Constance Holster M.D.   On: 03/26/2020 17:23   CT Angio Chest PE W and/or Wo Contrast  Result Date: 03/16/2020 CLINICAL DATA:  COVID, chest pain.  Concern for PE. EXAM: CT ANGIOGRAPHY CHEST WITH CONTRAST TECHNIQUE: Multidetector CT imaging of the chest was performed using the standard protocol during bolus administration of intravenous contrast. Multiplanar CT image reconstructions and MIPs were obtained to evaluate the vascular anatomy. CONTRAST:  52mL OMNIPAQUE IOHEXOL 300 MG/ML  SOLN COMPARISON:  11/27/2010 FINDINGS: Cardiovascular: No filling defects in the pulmonary arteries to suggest pulmonary emboli. Heart is normal size. Aorta is normal caliber. Coronary artery and aortic calcifications. Mediastinum/Nodes: No mediastinal, hilar, or axillary adenopathy. Trachea and esophagus are unremarkable. Thyroid unremarkable. Lungs/Pleura: Patchy peripheral bilateral airspace opacities, favor related to COVID pneumonia. Mild centrilobular emphysema. No effusions. Upper Abdomen: Insert for abdomen Musculoskeletal: Chest wall soft tissues are unremarkable. No acute bony abnormality. Review of the MIP images confirms the above findings. IMPRESSION: No evidence of pulmonary embolus. Patchy bilateral peripheral airspace opacities most compatible with COVID pneumonia. Coronary artery disease. Aortic Atherosclerosis (ICD10-I70.0) and Emphysema (ICD10-J43.9). Electronically Signed   By: Rolm Baptise M.D.   On: 03/16/2020 20:49   DG Chest Port 1 View  Result Date: 03/30/2020 CLINICAL DATA:  Fever.  History of COPD. EXAM: PORTABLE CHEST 1 VIEW COMPARISON:   CT chest 03/26/2020. Single-view of the chest 03/25/2020. FINDINGS: Hazy multifocal airspace disease has worsened since the prior plain film. Heart size is normal. No pneumothorax or pleural effusion. Emphysema again noted. No acute or focal bony abnormality. IMPRESSION: Worsened multifocal pneumonia. Electronically Signed   By: Inge Rise M.D.   On: 03/30/2020 14:11   DG Chest Port 1 View  Result Date: 03/25/2020 CLINICAL DATA:  Possible sepsis.  Shortness of breath and confusion. EXAM: PORTABLE CHEST 1 VIEW COMPARISON:  PA and lateral chest and CT chest 03/16/2020. FINDINGS: Patchy bilateral airspace disease persists and appears slightly worse on the left. No pneumothorax or pleural fluid. Heart size is normal. No acute or focal bony abnormality. IMPRESSION: Patchy bilateral airspace disease consistent with pneumonia appears slightly worse on the left  compared to the prior exam Electronically Signed   By: Inge Rise M.D.   On: 03/25/2020 14:58   VAS [place] LOWER EXTREMITY VENOUS (DVT)  Result Date: 03/31/2020  Lower Venous DVT Study Indications: Elevated Ddimer.  Risk Factors: COVID 19 positive. Comparison Study: No prior studies. Performing Technologist: Oliver Hum RVT  Examination Guidelines: A complete evaluation includes B-mode imaging, spectral Doppler, color Doppler, and power Doppler as needed of all accessible portions of each vessel. Bilateral testing is considered an integral part of a complete examination. Limited examinations for reoccurring indications may be performed as noted. The reflux portion of the exam is performed with the patient in reverse Trendelenburg.  +---------+---------------+---------+-----------+----------+--------------+  RIGHT     Compressibility Phasicity Spontaneity Properties Thrombus Aging  +---------+---------------+---------+-----------+----------+--------------+  CFV       Full            Yes       Yes                                     +---------+---------------+---------+-----------+----------+--------------+  SFJ       Full                                                             +---------+---------------+---------+-----------+----------+--------------+  FV Prox   Full                                                             +---------+---------------+---------+-----------+----------+--------------+  FV Mid    Full                                                             +---------+---------------+---------+-----------+----------+--------------+  FV Distal Full                                                             +---------+---------------+---------+-----------+----------+--------------+  PFV       Full                                                             +---------+---------------+---------+-----------+----------+--------------+  POP       Full            Yes       Yes                                    +---------+---------------+---------+-----------+----------+--------------+  PTV       Full                                                             +---------+---------------+---------+-----------+----------+--------------+  PERO      Full                                                             +---------+---------------+---------+-----------+----------+--------------+   +---------+---------------+---------+-----------+----------+--------------+  LEFT      Compressibility Phasicity Spontaneity Properties Thrombus Aging  +---------+---------------+---------+-----------+----------+--------------+  CFV       Full            Yes       Yes                                    +---------+---------------+---------+-----------+----------+--------------+  SFJ       Full                                                             +---------+---------------+---------+-----------+----------+--------------+  FV Prox   Full                                                              +---------+---------------+---------+-----------+----------+--------------+  FV Mid    Full                                                             +---------+---------------+---------+-----------+----------+--------------+  FV Distal Full                                                             +---------+---------------+---------+-----------+----------+--------------+  PFV       Full                                                             +---------+---------------+---------+-----------+----------+--------------+  POP       Full            Yes       Yes                                    +---------+---------------+---------+-----------+----------+--------------+  PTV       Full                                                             +---------+---------------+---------+-----------+----------+--------------+  PERO      Full                                                             +---------+---------------+---------+-----------+----------+--------------+     Summary: RIGHT: - There is no evidence of deep vein thrombosis in the lower extremity.  - No cystic structure found in the popliteal fossa.  LEFT: - There is no evidence of deep vein thrombosis in the lower extremity.  - No cystic structure found in the popliteal fossa.  *See table(s) above for measurements and observations. Electronically signed by Jamelle Haring on 03/31/2020 at 8:10:57 PM.    Final     Microbiology: Recent Results (from the past 240 hour(s))  Urine culture     Status: Abnormal   Collection Time: 03/25/20  2:36 PM   Specimen: In/Out Cath Urine  Result Value Ref Range Status   Specimen Description   Final    IN/OUT CATH URINE Performed at [facility], [place] [address] [place], [place] [address]    Special Requests   Final    NONE Performed at [facility], [place] [address], [place], [place] [address]    Culture MULTIPLE SPECIES PRESENT, SUGGEST RECOLLECTION (A)  Final    Report Status 03/27/2020 FINAL  Final  Blood Culture (routine x 2)     Status: None   Collection Time: 03/25/20  4:45 PM   Specimen: BLOOD LEFT HAND  Result Value Ref Range Status   Specimen Description   Final    BLOOD LEFT HAND Performed at [facility] [address] [place], [place] [address]    Special Requests   Final    BOTTLES DRAWN AEROBIC AND ANAEROBIC Blood Culture adequate volume Performed at [facility] [address] [place], [place] [address]    Culture   Final    NO GROWTH 5 DAYS Performed at [facility], [place] [address] [place], [place] [address]    Report Status 03/30/2020 FINAL  Final  Blood Culture (routine x 2)     Status: Abnormal   Collection Time: 03/25/20  4:45 PM   Specimen: BLOOD LEFT FOREARM  Result Value Ref Range Status   Specimen Description   Final    BLOOD LEFT FOREARM Performed at [facility] [address] [place], [place] [address]    Special Requests   Final    BOTTLES DRAWN AEROBIC AND ANAEROBIC Blood Culture adequate volume Performed at [facility] [address], [place], [place] [address]    Culture  Setup Time   Final    GRAM POSITIVE COCCI IN CLUSTERS AEROBIC BOTTLE ONLY CRITICAL RESULT CALLED TO, READ BACK BY AND VERIFIED WITH: A PHAM PHARMD $[id number]$  03/26/20 EB    Culture (A)  Final    STAPHYLOCOCCUS HOMINIS THE SIGNIFICANCE OF ISOLATING THIS ORGANISM  FROM A SINGLE SET OF BLOOD CULTURES WHEN MULTIPLE SETS ARE DRAWN IS UNCERTAIN. PLEASE NOTIFY THE MICROBIOLOGY DEPARTMENT WITHIN ONE WEEK IF SPECIATION AND SENSITIVITIES ARE REQUIRED. Performed at [facility], [place] [address], [place], [place] [address]    Report Status 03/28/2020 FINAL  Final  Blood Culture ID Panel (Reflexed)     Status: Abnormal   Collection Time: 03/25/20  4:45 PM  Result Value Ref Range Status   Enterococcus faecalis NOT DETECTED NOT DETECTED Final   Enterococcus  Faecium NOT DETECTED NOT DETECTED Final   Listeria monocytogenes NOT DETECTED NOT DETECTED Final   Staphylococcus species DETECTED (A) NOT DETECTED Final    Comment: CRITICAL RESULT CALLED TO, READ BACK BY AND VERIFIED WITH: A PHAM PHARMD $[email or web address]$  03/26/20 EB    Staphylococcus aureus (BCID) NOT DETECTED NOT DETECTED Final   Staphylococcus epidermidis DETECTED (A) NOT DETECTED Final    Comment: Methicillin (oxacillin) resistant coagulase negative staphylococcus. Possible blood culture contaminant (unless isolated from more than one blood culture draw or clinical case suggests pathogenicity). No antibiotic treatment is indicated for blood  culture contaminants. CRITICAL RESULT CALLED TO, READ BACK BY AND VERIFIED WITH: A PHAM PHARMD $RemoveBef'[email or web address]$  03/26/20 EB    Staphylococcus lugdunensis NOT DETECTED NOT DETECTED Final   Streptococcus species NOT DETECTED NOT DETECTED Final   Streptococcus agalactiae NOT DETECTED NOT DETECTED Final   Streptococcus pneumoniae NOT DETECTED NOT DETECTED Final   Streptococcus pyogenes NOT DETECTED NOT DETECTED Final   A.calcoaceticus-baumannii NOT DETECTED NOT DETECTED Final   Bacteroides fragilis NOT DETECTED NOT DETECTED Final   Enterobacterales NOT DETECTED NOT DETECTED Final   Enterobacter cloacae complex NOT DETECTED NOT DETECTED Final   Escherichia coli NOT DETECTED NOT DETECTED Final   Klebsiella aerogenes NOT DETECTED NOT DETECTED Final   Klebsiella oxytoca NOT DETECTED NOT DETECTED Final   Klebsiella pneumoniae NOT DETECTED NOT DETECTED Final   Proteus species NOT DETECTED NOT DETECTED Final   Salmonella species NOT DETECTED NOT DETECTED Final   Serratia marcescens NOT DETECTED NOT DETECTED Final   Haemophilus influenzae NOT DETECTED NOT DETECTED Final   Neisseria meningitidis NOT DETECTED NOT DETECTED Final   Pseudomonas aeruginosa NOT DETECTED NOT DETECTED Final   Stenotrophomonas maltophilia NOT DETECTED NOT DETECTED Final   Candida albicans NOT DETECTED  NOT DETECTED Final   Candida auris NOT DETECTED NOT DETECTED Final   Candida glabrata NOT DETECTED NOT DETECTED Final   Candida krusei NOT DETECTED NOT DETECTED Final   Candida parapsilosis NOT DETECTED NOT DETECTED Final   Candida tropicalis NOT DETECTED NOT DETECTED Final   Cryptococcus neoformans/gattii NOT DETECTED NOT DETECTED Final   Methicillin resistance mecA/C DETECTED (A) NOT DETECTED Final    Comment: CRITICAL RESULT CALLED TO, READ BACK BY AND VERIFIED WITH: A PHAM PHARMD $[email or web address]$  03/26/20 EB Performed at [facility], [address], [place], [place] [address]   Resp Panel by RT-PCR (Flu A&B, Covid) Nasopharyngeal Swab     Status: Abnormal   Collection Time: 03/25/20  5:47 PM   Specimen: Nasopharyngeal Swab; Nasopharyngeal(NP) swabs in vial transport medium  Result Value Ref Range Status   SARS Coronavirus 2 by RT PCR POSITIVE (A) NEGATIVE Final    Comment: RESULT CALLED TO, READ BACK BY AND VERIFIED WITH: C SIMPSON AT 1852 ON 03/25/2020 BY JPM (NOTE) SARS-CoV-2 target nucleic acids are DETECTED.  The SARS-CoV-2 RNA is generally detectable in upper respiratory specimens during the acute phase of infection. Positive results are indicative of the presence  of the identified virus, but do not rule out bacterial infection or co-infection with other pathogens not detected by the test. Clinical correlation with patient history and other diagnostic information is necessary to determine patient infection status. The expected result is Negative.  Fact Sheet for Patients: EntrepreneurPulse.com.au  Fact Sheet for Healthcare Providers: IncredibleEmployment.be  This test is not yet approved or cleared by the [place] FDA and  has been authorized for detection and/or diagnosis of SARS-CoV-2 by FDA under an Emergency Use Authorization (EUA).  This EUA will remain in effect (meaning this test ca n be used) for the duration of  the COVID-19  declaration under Section 564(b)(1) of the Act, 21 U.S.C. section 360bbb-3(b)(1), unless the authorization is terminated or revoked sooner.     Influenza A by PCR NEGATIVE NEGATIVE Final   Influenza B by PCR NEGATIVE NEGATIVE Final    Comment: (NOTE) The Xpert Xpress SARS-CoV-2/FLU/RSV plus assay is intended as an aid in the diagnosis of influenza from Nasopharyngeal swab specimens and should not be used as a sole basis for treatment. Nasal washings and aspirates are unacceptable for Xpert Xpress SARS-CoV-2/FLU/RSV testing.  Fact Sheet for Patients: EntrepreneurPulse.com.au  Fact Sheet for Healthcare Providers: IncredibleEmployment.be  This test is not yet approved or cleared by the [place] FDA and has been authorized for detection and/or diagnosis of SARS-CoV-2 by FDA under an Emergency Use Authorization (EUA). This EUA will remain in effect (meaning this test can be used) for the duration of the COVID-19 declaration under Section 564(b)(1) of the Act, 21 U.S.C. section 360bbb-3(b)(1), unless the authorization is terminated or revoked.  Performed at [facility], [place] [address], [place], [place] [address]   Culture, sputum-assessment     Status: None   Collection Time: 03/25/20  8:38 PM   Specimen: Expectorated Sputum  Result Value Ref Range Status   Specimen Description EXPECTORATED SPUTUM  Final   Special Requests NONE  Final   Sputum evaluation   Final    THIS SPECIMEN IS ACCEPTABLE FOR SPUTUM CULTURE Performed at [facility], [place] [address] [place], [place] [address]    Report Status 03/25/2020 FINAL  Final  MRSA PCR Screening     Status: None   Collection Time: 03/25/20  8:38 PM   Specimen: Nasal Mucosa; Nasopharyngeal  Result Value Ref Range Status   MRSA by PCR NEGATIVE NEGATIVE Final    Comment:        The GeneXpert MRSA Assay (FDA approved for NASAL specimens only), is one  component of a comprehensive MRSA colonization surveillance program. It is not intended to diagnose MRSA infection nor to guide or monitor treatment for MRSA infections. Performed at [facility], [place] [address] [place], [place] [address]   Culture, Respiratory w Gram Stain     Status: None   Collection Time: 03/25/20  8:38 PM  Result Value Ref Range Status   Specimen Description   Final    EXPECTORATED SPUTUM Performed at [facility] [address] [place], [place] [address]    Special Requests   Final    NONE Reflexed from [id number] Performed at [facility] [address], [place], [place] [address]    Gram Stain   Final    NO WBC SEEN FEW SQUAMOUS EPITHELIAL CELLS PRESENT ABUNDANT GRAM POSITIVE COCCI ABUNDANT GRAM NEGATIVE RODS MODERATE GRAM POSITIVE RODS    Culture   Final    ABUNDANT Consistent with normal respiratory  flora. No Pseudomonas species isolated Performed at [facility] [address], [place], [place] [address]    Report Status 03/28/2020 FINAL  Final  Culture, blood (routine x 2)     Status: None   Collection Time: 03/27/20  9:23 AM   Specimen: BLOOD RIGHT HAND  Result Value Ref Range Status   Specimen Description   Final    BLOOD RIGHT HAND Performed at [facility], [place] [address], [place], [place] [address]    Special Requests   Final    BOTTLES DRAWN AEROBIC ONLY Blood Culture results may not be optimal due to an inadequate volume of blood received in culture bottles Performed at [facility] [address], [place], [place] [address]    Culture   Final    NO GROWTH 5 DAYS Performed at [facility], [place] [address], [place], [place] [address]    Report Status 04/01/2020 FINAL  Final  Culture, blood (routine x 2)     Status: None   Collection Time: 03/27/20  9:23 AM   Specimen: BLOOD LEFT HAND  Result Value Ref Range Status   Specimen  Description   Final    BLOOD LEFT HAND Performed at [facility], [place] [address], [place], [place] [address]    Special Requests   Final    BOTTLES DRAWN AEROBIC AND ANAEROBIC Blood Culture results may not be optimal due to an inadequate volume of blood received in culture bottles Performed at [facility] [address] [place], [place] [address]    Culture   Final    NO GROWTH 5 DAYS Performed at [facility], [place] [address], [place], [place] [address]    Report Status 04/01/2020 FINAL  Final  Culture, blood (routine x 2)     Status: None (Preliminary result)   Collection Time: 03/30/20  1:29 PM   Specimen: BLOOD RIGHT HAND  Result Value Ref Range Status   Specimen Description   Final    BLOOD RIGHT HAND Performed at [facility] [address] [place], [place] [address]    Special Requests   Final    BOTTLES DRAWN AEROBIC ONLY Blood Culture results may not be optimal due to an inadequate volume of blood received in culture bottles Performed at [facility] [address], [place], [place] [address]    Culture   Final    NO GROWTH 2 DAYS Performed at [facility] [address], [place], [place] [address]    Report Status PENDING  Incomplete  Culture, blood (routine x 2)     Status: None (Preliminary result)   Collection Time: 03/30/20  1:29 PM   Specimen: BLOOD RIGHT HAND  Result Value Ref Range Status   Specimen Description   Final    BLOOD RIGHT HAND Performed at [facility] [address], [place], [place] [address]    Special Requests   Final    BOTTLES DRAWN AEROBIC AND ANAEROBIC Blood Culture adequate volume Performed at [facility] [address], [place], [place] [address]    Culture   Final    NO GROWTH 2 DAYS Performed at [facility] [address], [place],  [address]    Report Status PENDING  Incomplete  Culture, Urine      Status: None   Collection Time: 03/30/20  5:04 PM   Specimen: Urine,  Random  Result Value Ref Range Status   Specimen Description   Final    URINE, RANDOM Performed at [facility] [address], [place], [place] [address]    Special Requests   Final    NONE Performed at [facility], [place] [address], [place], [place] [address]    Culture   Final    NO GROWTH Performed at [facility], [place] [address] [place], [place] [address]    Report Status 04/01/2020 FINAL  Final     Labs: Basic Metabolic Panel: Recent Labs  Lab 03/27/20 0923 03/28/20 0425 03/29/20 0439 03/30/20 0446 03/31/20 0430 04/01/20 0421  NA 132* 131* 133* 137 137 137  K 3.9 3.7 3.7 4.5 4.2 3.5  CL 101 102 102 103 106 107  CO2 23 21* 22 24 21* 20*  GLUCOSE 94 106* 97 115* 204* 319*  BUN _0 32*  CREATININE 1.22 1.30* 1.32* 1.22 1.10 1.10  CALCIUM 8.2* 8.6* 9.0 9.0 8.8* 8.7*  MG 1.5* 1.6* 1.7 1.8 1.9  --    Liver Function Tests: Recent Labs  Lab 03/25/20 1451  AST 51*  ALT 26  ALKPHOS 39  BILITOT 1.1  PROT 5.4*  ALBUMIN 2.3*   No results for input(s): LIPASE, AMYLASE in the last 168 hours. No results for input(s): AMMONIA in the last 168 hours. CBC: Recent Labs  Lab 03/28/20 0425 03/29/20 0439 03/30/20 0446 03/31/20 0430 04/01/20 0421  WBC 3.4* 3.1* 3.7* 2.2* 4.0  NEUTROABS 2.7 2.4 2.9 1.9 3.4  HGB 10.8* 11.2* 11.7* 10.2* 10.4*  HCT 32.4* 34.1* 34.6* 31.8* 31.1*  MCV 86.9 88.6 86.7 89.1 87.6  PLT 81* 99* 128* 129* 162   Cardiac Enzymes: No results for input(s): CKTOTAL, CKMB, CKMBINDEX, TROPONINI in the last 168 hours. BNP: BNP (last 3 results) Recent Labs    03/16/20 1943 03/25/20 1508  BNP 39.6 46.5    ProBNP (last 3 results) No results for input(s): PROBNP in the last 8760 hours.  CBG: Recent Labs  Lab 03/29/20 0745 03/30/20 0844 03/31/20 0749 04/01/20 0835 04/01/20 1146  GLUCAP 92 99 191* 254* 318*        Signed:  Alma Friendly, MD [facility] 04/01/2020, 1:39 PM

## 2020-04-02 ENCOUNTER — Emergency Department (HOSPITAL_COMMUNITY): Payer: Medicare Other

## 2020-04-02 ENCOUNTER — Inpatient Hospital Stay (HOSPITAL_COMMUNITY)
Admission: EM | Admit: 2020-04-02 | Discharge: 2020-04-06 | DRG: 177 | Disposition: A | Discharge status: Skilled Nursing Facility | Payer: Medicare Other | Source: Skilled Nursing Facility | Attending: Internal Medicine | Admitting: Internal Medicine

## 2020-04-02 DIAGNOSIS — Z881 Allergy status to other antibiotic agents status: Secondary | ICD-10-CM

## 2020-04-02 DIAGNOSIS — A419 Sepsis, unspecified organism: Secondary | ICD-10-CM | POA: Diagnosis not present

## 2020-04-02 DIAGNOSIS — N200 Calculus of kidney: Secondary | ICD-10-CM | POA: Diagnosis not present

## 2020-04-02 DIAGNOSIS — Z87891 Personal history of nicotine dependence: Secondary | ICD-10-CM

## 2020-04-02 DIAGNOSIS — Z800 Family history of malignant neoplasm of digestive organs: Secondary | ICD-10-CM

## 2020-04-02 DIAGNOSIS — R4182 Altered mental status, unspecified: Secondary | ICD-10-CM

## 2020-04-02 DIAGNOSIS — F0390 Unspecified dementia without behavioral disturbance: Secondary | ICD-10-CM | POA: Diagnosis present

## 2020-04-02 DIAGNOSIS — Z8249 Family history of ischemic heart disease and other diseases of the circulatory system: Secondary | ICD-10-CM

## 2020-04-02 DIAGNOSIS — E785 Hyperlipidemia, unspecified: Secondary | ICD-10-CM | POA: Diagnosis present

## 2020-04-02 DIAGNOSIS — Z79899 Other long term (current) drug therapy: Secondary | ICD-10-CM

## 2020-04-02 DIAGNOSIS — J690 Pneumonitis due to inhalation of food and vomit: Secondary | ICD-10-CM | POA: Diagnosis not present

## 2020-04-02 DIAGNOSIS — R5381 Other malaise: Secondary | ICD-10-CM | POA: Diagnosis present

## 2020-04-02 DIAGNOSIS — Z833 Family history of diabetes mellitus: Secondary | ICD-10-CM

## 2020-04-02 DIAGNOSIS — R1084 Generalized abdominal pain: Secondary | ICD-10-CM

## 2020-04-02 DIAGNOSIS — N400 Enlarged prostate without lower urinary tract symptoms: Secondary | ICD-10-CM | POA: Diagnosis present

## 2020-04-02 DIAGNOSIS — I5032 Chronic diastolic (congestive) heart failure: Secondary | ICD-10-CM | POA: Diagnosis not present

## 2020-04-02 DIAGNOSIS — I130 Hypertensive heart and chronic kidney disease with heart failure and stage 1 through stage 4 chronic kidney disease, or unspecified chronic kidney disease: Secondary | ICD-10-CM | POA: Diagnosis present

## 2020-04-02 DIAGNOSIS — J9601 Acute respiratory failure with hypoxia: Secondary | ICD-10-CM | POA: Diagnosis not present

## 2020-04-02 DIAGNOSIS — I252 Old myocardial infarction: Secondary | ICD-10-CM

## 2020-04-02 DIAGNOSIS — Z888 Allergy status to other drugs, medicaments and biological substances status: Secondary | ICD-10-CM

## 2020-04-02 DIAGNOSIS — J449 Chronic obstructive pulmonary disease, unspecified: Secondary | ICD-10-CM | POA: Diagnosis present

## 2020-04-02 DIAGNOSIS — R0902 Hypoxemia: Secondary | ICD-10-CM | POA: Diagnosis not present

## 2020-04-02 DIAGNOSIS — Z8261 Family history of arthritis: Secondary | ICD-10-CM

## 2020-04-02 DIAGNOSIS — R0602 Shortness of breath: Secondary | ICD-10-CM

## 2020-04-02 DIAGNOSIS — Z8042 Family history of malignant neoplasm of prostate: Secondary | ICD-10-CM

## 2020-04-02 DIAGNOSIS — Z808 Family history of malignant neoplasm of other organs or systems: Secondary | ICD-10-CM

## 2020-04-02 DIAGNOSIS — T1490XA Injury, unspecified, initial encounter: Secondary | ICD-10-CM

## 2020-04-02 DIAGNOSIS — G9341 Metabolic encephalopathy: Secondary | ICD-10-CM | POA: Diagnosis not present

## 2020-04-02 DIAGNOSIS — Z8616 Personal history of COVID-19: Secondary | ICD-10-CM | POA: Diagnosis not present

## 2020-04-02 DIAGNOSIS — Z8371 Family history of colonic polyps: Secondary | ICD-10-CM

## 2020-04-02 DIAGNOSIS — R918 Other nonspecific abnormal finding of lung field: Secondary | ICD-10-CM | POA: Diagnosis not present

## 2020-04-02 DIAGNOSIS — K219 Gastro-esophageal reflux disease without esophagitis: Secondary | ICD-10-CM | POA: Diagnosis present

## 2020-04-02 DIAGNOSIS — R000 Tachycardia, unspecified: Secondary | ICD-10-CM | POA: Diagnosis not present

## 2020-04-02 DIAGNOSIS — I2510 Atherosclerotic heart disease of native coronary artery without angina pectoris: Secondary | ICD-10-CM | POA: Diagnosis present

## 2020-04-02 DIAGNOSIS — R509 Fever, unspecified: Secondary | ICD-10-CM

## 2020-04-02 DIAGNOSIS — U071 COVID-19: Secondary | ICD-10-CM | POA: Diagnosis present

## 2020-04-02 DIAGNOSIS — M069 Rheumatoid arthritis, unspecified: Secondary | ICD-10-CM | POA: Diagnosis present

## 2020-04-02 DIAGNOSIS — N1832 Chronic kidney disease, stage 3b: Secondary | ICD-10-CM | POA: Diagnosis present

## 2020-04-02 LAB — COMPREHENSIVE METABOLIC PANEL
ALT: 74 U/L — ABNORMAL HIGH (ref 0–44)
AST: 116 U/L — ABNORMAL HIGH (ref 15–41)
Albumin: 2 g/dL — ABNORMAL LOW (ref 3.5–5.0)
Alkaline Phosphatase: 97 U/L (ref 38–126)
Anion gap: 10 (ref 5–15)
BUN: 19 mg/dL (ref 8–23)
CO2: 22 mmol/L (ref 22–32)
Calcium: 9 mg/dL (ref 8.9–10.3)
Chloride: 106 mmol/L (ref 98–111)
Creatinine, Ser: 1.29 mg/dL — ABNORMAL HIGH (ref 0.61–1.24)
GFR, Estimated: 58 mL/min — ABNORMAL LOW (ref >60)
Glucose, Bld: 208 mg/dL — ABNORMAL HIGH (ref 70–99)
Potassium: 3.2 mmol/L — ABNORMAL LOW (ref 3.5–5.1)
Sodium: 138 mmol/L (ref 135–145)
Total Bilirubin: 0.8 mg/dL (ref 0.3–1.2)
Total Protein: 5.2 g/dL — ABNORMAL LOW (ref 6.5–8.1)

## 2020-04-02 LAB — CBC WITH DIFFERENTIAL/PLATELET
Abs Immature Granulocytes: 0.21 10*3/uL — ABNORMAL HIGH (ref 0.00–0.07)
Basophils Absolute: 0 10*3/uL (ref 0.0–0.1)
Basophils Relative: 0 %
Eosinophils Absolute: 0 10*3/uL (ref 0.0–0.5)
Eosinophils Relative: 0 %
HCT: 36.9 % — ABNORMAL LOW (ref 39.0–52.0)
Hemoglobin: 12 g/dL — ABNORMAL LOW (ref 13.0–17.0)
Immature Granulocytes: 4 %
Lymphocytes Relative: 4 %
Lymphs Abs: 0.2 10*3/uL — ABNORMAL LOW (ref 0.7–4.0)
MCH: 28.4 pg (ref 26.0–34.0)
MCHC: 32.5 g/dL (ref 30.0–36.0)
MCV: 87.4 fL (ref 80.0–100.0)
Monocytes Absolute: 0.2 10*3/uL (ref 0.1–1.0)
Monocytes Relative: 4 %
Neutro Abs: 4.3 10*3/uL (ref 1.7–7.7)
Neutrophils Relative %: 88 %
Platelets: 249 10*3/uL (ref 150–400)
RBC: 4.22 MIL/uL (ref 4.22–5.81)
RDW: 15 % (ref 11.5–15.5)
WBC: 5 10*3/uL (ref 4.0–10.5)
nRBC: 0.8 % — ABNORMAL HIGH (ref 0.0–0.2)

## 2020-04-02 LAB — PROTIME-INR
INR: 1.2 (ref 0.8–1.2)
Prothrombin Time: 14.8 seconds (ref 11.4–15.2)

## 2020-04-02 LAB — I-STAT VENOUS BLOOD GAS, ED
Acid-Base Excess: 2 mmol/L (ref 0.0–2.0)
Bicarbonate: 24.7 mmol/L (ref 20.0–28.0)
Calcium, Ion: 0.94 mmol/L — ABNORMAL LOW (ref 1.15–1.40)
HCT: 33 % — ABNORMAL LOW (ref 39.0–52.0)
Hemoglobin: 11.2 g/dL — ABNORMAL LOW (ref 13.0–17.0)
O2 Saturation: 81 %
Potassium: 3.2 mmol/L — ABNORMAL LOW (ref 3.5–5.1)
Sodium: 140 mmol/L (ref 135–145)
TCO2: 26 mmol/L (ref 22–32)
pCO2, Ven: 32.9 mmHg — ABNORMAL LOW (ref 44.0–60.0)
pH, Ven: 7.485 — ABNORMAL HIGH (ref 7.250–7.430)
pO2, Ven: 41 mmHg (ref 32.0–45.0)

## 2020-04-02 LAB — LIPASE, BLOOD: Lipase: 42 U/L (ref 11–51)

## 2020-04-02 LAB — TROPONIN I (HIGH SENSITIVITY): Troponin I (High Sensitivity): 120 ng/L (ref <18)

## 2020-04-02 LAB — APTT: aPTT: 33 seconds (ref 24–36)

## 2020-04-02 LAB — LACTIC ACID, PLASMA: Lactic Acid, Venous: 2.5 mmol/L (ref 0.5–1.9)

## 2020-04-02 MED ORDER — ACETAMINOPHEN 500 MG PO TABS
1000.0000 mg | ORAL_TABLET | Freq: Once | ORAL | Status: AC
  Administered 2020-04-02: 1000 mg via ORAL
  Filled 2020-04-02: qty 2

## 2020-04-02 MED ORDER — VANCOMYCIN HCL 1000 MG/200ML IV SOLN: 1000.0000 mg | Freq: Once | INTRAVENOUS | Status: DC

## 2020-04-02 MED ORDER — METRONIDAZOLE IN NACL 5-0.79 MG/ML-% IV SOLN: 500.0000 mg | Freq: Once | INTRAVENOUS | Status: DC

## 2020-04-02 MED ORDER — LACTATED RINGERS IV BOLUS (SEPSIS)
500.0000 mL | Freq: Once | INTRAVENOUS | Status: AC
  Administered 2020-04-03: 500 mL via INTRAVENOUS

## 2020-04-02 MED ORDER — SODIUM CHLORIDE 0.9 % IV SOLN: 2.0000 g | Freq: Once | INTRAVENOUS | Status: DC

## 2020-04-02 MED ORDER — VANCOMYCIN HCL 1750 MG/350ML IV SOLN
1750.0000 mg | Freq: Once | INTRAVENOUS | Status: AC
  Administered 2020-04-02: 1750 mg via INTRAVENOUS
  Filled 2020-04-02: qty 350

## 2020-04-02 MED ORDER — IOHEXOL 300 MG/ML  SOLN
100.0000 mL | Freq: Once | INTRAMUSCULAR | Status: AC | PRN
  Administered 2020-04-02: 100 mL via INTRAVENOUS

## 2020-04-02 MED ORDER — LACTATED RINGERS IV BOLUS (SEPSIS)
1000.0000 mL | Freq: Once | INTRAVENOUS | Status: AC
  Administered 2020-04-02: 1000 mL via INTRAVENOUS

## 2020-04-02 MED ORDER — PIPERACILLIN-TAZOBACTAM 3.375 G IVPB 30 MIN
3.3750 g | Freq: Once | INTRAVENOUS | Status: AC
  Administered 2020-04-02: 3.375 g via INTRAVENOUS
  Filled 2020-04-02: qty 50

## 2020-04-02 NOTE — ED Notes (Signed)
This RN called xray to check about results. Xray informed this RN that they were unable to get the xray as the pt was moving around too much and being uncooperative. This RN will contact xray again when pt is back from CT

## 2020-04-02 NOTE — ED Notes (Signed)
Dr. Dorothea Ogle notified of critical values

## 2020-04-02 NOTE — ED Provider Notes (Signed)
23:20: Assumed care of patient from Dr. Lanny Hurst & Dr. Gilford Raid @ change of shift pending CT imaging & consult for admission.   Please see prior provider note for full H&P.  Briefly patient is a 75 yo male w/ extensive pmhx including CAD, COPD, hypertension, hyperlipidemia, and rheumatoid arthritis who returned to the ED after discharge from the hospital yesterday for AMS. Recent admission for pneumonia w/ hypoxia/sepsis, discharged yesterday off oxygen, returned with AMS, concern for sepsis- febrile, and with return of 2L oxygen requirement.   Has received antibiotics as well as 30 cc/kg bolus for sepsis. Labs were notable for mildly elevated lactic acid, normal white blood cell count, anemia similar to prior ranges, elevated troponin suspect to be secondary to demand.  Patient care signed out to me pending CT imaging and admission, per prior did not appear to require lumbar puncture, no meningismus, also pending UA.   CT imaging without acute process, additional findings as below.  UA without UTI.   01:15: CONSULT: Discussed with hospitalist Dr. Loletta Specter admission.    Results for orders placed or performed during the hospital encounter of 04/02/20  Lactic acid, plasma  Result Value Ref Range   Lactic Acid, Venous 2.5 (HH) 0.5 - 1.9 mmol/L  Lactic acid, plasma  Result Value Ref Range   Lactic Acid, Venous 1.8 0.5 - 1.9 mmol/L  Comprehensive metabolic panel  Result Value Ref Range   Sodium 138 135 - 145 mmol/L   Potassium 3.2 (L) 3.5 - 5.1 mmol/L   Chloride 106 98 - 111 mmol/L   CO2 22 22 - 32 mmol/L   Glucose, Bld 208 (H) 70 - 99 mg/dL   BUN 19 8 - 23 mg/dL   Creatinine, Ser 1.29 (H) 0.61 - 1.24 mg/dL   Calcium 9.0 8.9 - 10.3 mg/dL   Total Protein 5.2 (L) 6.5 - 8.1 g/dL   Albumin 2.0 (L) 3.5 - 5.0 g/dL   AST 116 (H) 15 - 41 U/L   ALT 74 (H) 0 - 44 U/L   Alkaline Phosphatase 97 38 - 126 U/L   Total Bilirubin 0.8 0.3 - 1.2 mg/dL   GFR, Estimated 58 (L) >60 mL/min   Anion  gap 10 5 - 15  CBC WITH DIFFERENTIAL  Result Value Ref Range   WBC 5.0 4.0 - 10.5 K/uL   RBC 4.22 4.22 - 5.81 MIL/uL   Hemoglobin 12.0 (L) 13.0 - 17.0 g/dL   HCT 36.9 (L) 39.0 - 52.0 %   MCV 87.4 80.0 - 100.0 fL   MCH 28.4 26.0 - 34.0 pg   MCHC 32.5 30.0 - 36.0 g/dL   RDW 15.0 11.5 - 15.5 %   Platelets 249 150 - 400 K/uL   nRBC 0.8 (H) 0.0 - 0.2 %   Neutrophils Relative % 88 %   Neutro Abs 4.3 1.7 - 7.7 K/uL   Lymphocytes Relative 4 %   Lymphs Abs 0.2 (L) 0.7 - 4.0 K/uL   Monocytes Relative 4 %   Monocytes Absolute 0.2 0.1 - 1.0 K/uL   Eosinophils Relative 0 %   Eosinophils Absolute 0.0 0.0 - 0.5 K/uL   Basophils Relative 0 %   Basophils Absolute 0.0 0.0 - 0.1 K/uL   Immature Granulocytes 4 %   Abs Immature Granulocytes 0.21 (H) 0.00 - 0.07 K/uL  Protime-INR  Result Value Ref Range   Prothrombin Time 14.8 11.4 - 15.2 seconds   INR 1.2 0.8 - 1.2  APTT  Result Value Ref Range  aPTT 33 24 - 36 seconds  Urinalysis, Routine w reflex microscopic  Result Value Ref Range   Color, Urine YELLOW YELLOW   APPearance CLEAR CLEAR   Specific Gravity, Urine 1.016 1.005 - 1.030   pH 6.0 5.0 - 8.0   Glucose, UA NEGATIVE NEGATIVE mg/dL   Hgb urine dipstick SMALL (A) NEGATIVE   Bilirubin Urine NEGATIVE NEGATIVE   Ketones, ur NEGATIVE NEGATIVE mg/dL   Protein, ur NEGATIVE NEGATIVE mg/dL   Nitrite NEGATIVE NEGATIVE   Leukocytes,Ua NEGATIVE NEGATIVE   RBC / HPF 0-5 0 - 5 RBC/hpf   WBC, UA 0-5 0 - 5 WBC/hpf   Bacteria, UA NONE SEEN NONE SEEN   Mucus PRESENT   Lipase, blood  Result Value Ref Range   Lipase 42 11 - 51 U/L  CBC with Differential/Platelet  Result Value Ref Range   WBC 4.8 4.0 - 10.5 K/uL   RBC 3.59 (L) 4.22 - 5.81 MIL/uL   Hemoglobin 10.7 (L) 13.0 - 17.0 g/dL   HCT 31.1 (L) 39.0 - 52.0 %   MCV 86.6 80.0 - 100.0 fL   MCH 29.8 26.0 - 34.0 pg   MCHC 34.4 30.0 - 36.0 g/dL   RDW 14.9 11.5 - 15.5 %   Platelets 205 150 - 400 K/uL   nRBC 0.4 (H) 0.0 - 0.2 %   Neutrophils  Relative % 81 %   Neutro Abs 3.9 1.7 - 7.7 K/uL   Lymphocytes Relative 8 %   Lymphs Abs 0.4 (L) 0.7 - 4.0 K/uL   Monocytes Relative 4 %   Monocytes Absolute 0.2 0.1 - 1.0 K/uL   Eosinophils Relative 1 %   Eosinophils Absolute 0.0 0.0 - 0.5 K/uL   Basophils Relative 1 %   Basophils Absolute 0.0 0.0 - 0.1 K/uL   Immature Granulocytes 5 %   Abs Immature Granulocytes 0.24 (H) 0.00 - 0.07 K/uL  I-Stat venous blood gas, [facility] ED)  Result Value Ref Range   pH, Ven 7.485 (H) 7.250 - 7.430   pCO2, Ven 32.9 (L) 44.0 - 60.0 mmHg   pO2, Ven 41.0 32.0 - 45.0 mmHg   Bicarbonate 24.7 20.0 - 28.0 mmol/L   TCO2 26 22 - 32 mmol/L   O2 Saturation 81.0 %   Acid-Base Excess 2.0 0.0 - 2.0 mmol/L   Sodium 140 135 - 145 mmol/L   Potassium 3.2 (L) 3.5 - 5.1 mmol/L   Calcium, Ion 0.94 (L) 1.15 - 1.40 mmol/L   HCT 33.0 (L) 39.0 - 52.0 %   Hemoglobin 11.2 (L) 13.0 - 17.0 g/dL   Sample type VENOUS   Troponin I (High Sensitivity)  Result Value Ref Range   Troponin I (High Sensitivity) 120 (HH) <18 ng/L  Troponin I (High Sensitivity)  Result Value Ref Range   Troponin I (High Sensitivity) 73 (H) <18 ng/L   DG Chest 2 View  Result Date: 03/16/2020 CLINICAL DATA:  Chest pain, COVID-19 positive. EXAM: CHEST - 2 VIEW COMPARISON:  February 29, 2020. FINDINGS: The heart size and mediastinal contours are within normal limits. No pneumothorax or pleural effusion is noted. Multiple patchy airspace opacities are noted bilaterally consistent with multifocal pneumonia. The visualized skeletal structures are unremarkable. IMPRESSION: Bilateral multifocal pneumonia. Electronically Signed   By: Marijo Conception M.D.   On: 03/16/2020 13:58   CT Head Wo Contrast  Result Date: 04/02/2020 CLINICAL DATA:  Altered mental status, sepsis, hypoxia, fever EXAM: CT HEAD WITHOUT CONTRAST CT CHEST, ABDOMEN AND PELVIS WITH CONTRAST TECHNIQUE:  Contiguous axial images were obtained from the base of the skull through the vertex without  intravenous contrast. Multiplanar CT image reconstructions were also generated. Multidetector CT imaging of the chest, abdomen and pelvis was performed following the standard protocol during bolus administration of intravenous contrast. CONTRAST:  139mL OMNIPAQUE IOHEXOL 300 MG/ML  SOLN COMPARISON:  CT head 03/30/2020, CT chest 03/26/2020, CT abdomen pelvis 10/07/2017 FINDINGS: CT HEAD FINDINGS Brain: Normal anatomic configuration of the brain. No evidence of acute intracranial hemorrhage or infarct. Mild parenchymal volume loss is commensurate with the patient's age. Mild periventricular white matter changes are present likely reflecting the sequela of small vessel ischemia. No abnormal intra or extra-axial mass lesion. Left posterior fossa parasagittal arachnoid cyst is unchanged. Ventricular size is normal. Cerebellum is unremarkable. Vascular: No asymmetric hyperdense vasculature at the skull base. Skull: Intact Sinuses/Orbits: The paranasal sinuses are clear. The orbits are unremarkable. Other: Mastoid air cells and middle ear cavities are clear. CT CHEST FINDINGS Cardiovascular: Extensive coronary artery calcification predominantly within the a left anterior descending coronary artery. Global cardiac size within normal limits. Mild left ventricular hypertrophy noted, however. No pericardial effusion. The central pulmonary arteries are of normal caliber. Minimal atherosclerotic calcification is seen within the thoracic aorta. No aortic aneurysm. Mediastinum/Nodes: No pathologic thoracic adenopathy. Esophagus unremarkable. Thyroid unremarkable peer Lungs/Pleura: Diffuse bilateral interstitial and ground-glass pulmonary infiltrate is again noted and has largely improved in the interval since prior examination. Evaluation of the pulmonary parenchyma is slightly limited by motion artifact. There is, however, progressive volume loss identified with increasing reticulation at the lung bases possibly representing  progressive atelectasis or developing inflammatory change. No pneumothorax or pleural effusion. Central airways are patent. Musculoskeletal: No acute bone abnormality. No lytic or blastic bone lesion. CT ABDOMEN PELVIS FINDINGS Hepatobiliary: No focal liver abnormality is seen. No gallstones, gallbladder wall thickening, or biliary dilatation. Pancreas: Unremarkable Spleen: Unremarkable Adrenals/Urinary Tract: The adrenal glands are unremarkable. The kidneys are normal in size and position. Multiple punctate nonobstructing calculi are noted within the kidneys bilaterally. Several larger calculi measuring up to 10 mm are noted within the lower pole of the left kidney adjacent to a caliceal diverticulum. Simple cortical cyst noted within the interpolar region of the right kidney. The kidneys are otherwise unremarkable. No hydronephrosis. No ureteral calculi. The bladder is unremarkable. Stomach/Bowel: Severe sigmoid diverticulosis. The stomach, small bowel, and large bowel are otherwise unremarkable. Appendix normal. No free intraperitoneal gas or fluid. Vascular/Lymphatic: Mild aortoiliac atherosclerotic calcification. No aortic aneurysm. No pathologic adenopathy within the abdomen and pelvis. Reproductive: Prostate is unremarkable. Other: No abdominal wall hernia.  The rectum is unremarkable. Musculoskeletal: No acute bone abnormality. There has developed acquired fusion of the a L1 and L2 vertebral bodies with a mild resultant focal kyphotic deformity at this level. No superimposed osseous erosion noted. Right L2 hemilaminectomy has been performed. No lytic or blastic bone lesions. IMPRESSION: No acute intracranial abnormality. Diffuse interstitial and ground-glass pulmonary infiltrate has overall improved since prior examination, though there is progressive volume loss and bibasilar increasing reticulation, possibly representing atelectasis or progressive inflammatory change in this region. No central obstructing  mass. No acute intra-abdominal pathology identified. Bilateral nonobstructing nephrolithiasis. Severe sigmoid diverticulosis. Postsurgical changes within the lumbar spine with acquired fusion of L1-2. Aortic Atherosclerosis (ICD10-I70.0). Electronically Signed   By: Fidela Salisbury MD   On: 04/02/2020 23:48   CT HEAD WO CONTRAST  Result Date: 03/30/2020 CLINICAL DATA:  Mental status changes of unknown etiology. Coronavirus pneumonia. EXAM: CT HEAD  WITHOUT CONTRAST TECHNIQUE: Contiguous axial images were obtained from the base of the skull through the vertex without intravenous contrast. COMPARISON:  MRI 12/13/2017 FINDINGS: Brain: No change since prior appearances. Mild age related volume loss. No sign of acute infarction, mass lesion, hemorrhage, hydrocephalus or extra-axial collection. Incidental mega cisterna magna. Vascular: There is atherosclerotic calcification of the major vessels at the base of the brain. Skull: Negative Sinuses/Orbits: Clear/normal Other: None IMPRESSION: No acute or reversible finding. Mild age related volume loss. Atherosclerotic calcification of the major vessels at the base of the brain. Electronically Signed   By: Nelson Chimes M.D.   On: 03/30/2020 19:15   CT ANGIO CHEST PE W OR WO CONTRAST  Result Date: 03/26/2020 CLINICAL DATA:  Shortness of breath.  COVID positive. EXAM: CT ANGIOGRAPHY CHEST WITH CONTRAST TECHNIQUE: Multidetector CT imaging of the chest was performed using the standard protocol during bolus administration of intravenous contrast. Multiplanar CT image reconstructions and MIPs were obtained to evaluate the vascular anatomy. CONTRAST:  176mL OMNIPAQUE IOHEXOL 350 MG/ML SOLN COMPARISON:  03/16/2020 FINDINGS: Cardiovascular: Examination is limited by respiratory motion artifact. Within that limitation, there is no central pulmonary embolus. The size of the main pulmonary artery is normal. Normal heart size with coronary artery calcification. The course and caliber  of the aorta are normal. There is mild atherosclerotic calcification. Opacification decreased due to pulmonary arterial phase contrast bolus timing. Mediastinum/Nodes: -- No mediastinal lymphadenopathy. -- No hilar lymphadenopathy. -- No axillary lymphadenopathy. -- No supraclavicular lymphadenopathy. -- Normal thyroid gland where visualized. -  Unremarkable esophagus. Lungs/Pleura: There are trace bilateral pleural effusions. There are diffuse bilateral ground-glass airspace opacities. Emphysematous changes are suspected at the lung apices. There are pleural based calcified and noncalcified plaques. Overall, the airspace disease has worsened since the prior study. There is no pneumothorax. The trachea is unremarkable. There are trace bilateral pleural effusions. Upper Abdomen: Contrast bolus timing is not optimized for evaluation of the abdominal organs. The visualized portions of the organs of the upper abdomen are normal. Musculoskeletal: No chest wall abnormality. No bony spinal canal stenosis. Review of the MIP images confirms the above findings. IMPRESSION: 1. Examination is limited by respiratory motion artifact. Within that limitation, there is no central pulmonary embolus. 2. Worsening bilateral ground-glass airspace opacities, consistent with the patient's history of viral pneumonia. 3. Trace bilateral pleural effusions. 4. Calcified and noncalcified pleural plaques, consistent with prior asbestos exposure. Aortic Atherosclerosis (ICD10-I70.0) and Emphysema (ICD10-J43.9). Electronically Signed   By: Constance Holster M.D.   On: 03/26/2020 17:23   CT Angio Chest PE W and/or Wo Contrast  Result Date: 03/16/2020 CLINICAL DATA:  COVID, chest pain.  Concern for PE. EXAM: CT ANGIOGRAPHY CHEST WITH CONTRAST TECHNIQUE: Multidetector CT imaging of the chest was performed using the standard protocol during bolus administration of intravenous contrast. Multiplanar CT image reconstructions and MIPs were obtained  to evaluate the vascular anatomy. CONTRAST:  79mL OMNIPAQUE IOHEXOL 300 MG/ML  SOLN COMPARISON:  11/27/2010 FINDINGS: Cardiovascular: No filling defects in the pulmonary arteries to suggest pulmonary emboli. Heart is normal size. Aorta is normal caliber. Coronary artery and aortic calcifications. Mediastinum/Nodes: No mediastinal, hilar, or axillary adenopathy. Trachea and esophagus are unremarkable. Thyroid unremarkable. Lungs/Pleura: Patchy peripheral bilateral airspace opacities, favor related to COVID pneumonia. Mild centrilobular emphysema. No effusions. Upper Abdomen: Insert for abdomen Musculoskeletal: Chest wall soft tissues are unremarkable. No acute bony abnormality. Review of the MIP images confirms the above findings. IMPRESSION: No evidence of pulmonary embolus. Patchy bilateral peripheral  airspace opacities most compatible with COVID pneumonia. Coronary artery disease. Aortic Atherosclerosis (ICD10-I70.0) and Emphysema (ICD10-J43.9). Electronically Signed   By: Rolm Baptise M.D.   On: 03/16/2020 20:49   CT CHEST ABDOMEN PELVIS W CONTRAST  Result Date: 04/02/2020 CLINICAL DATA:  Altered mental status, sepsis, hypoxia, fever EXAM: CT HEAD WITHOUT CONTRAST CT CHEST, ABDOMEN AND PELVIS WITH CONTRAST TECHNIQUE: Contiguous axial images were obtained from the base of the skull through the vertex without intravenous contrast. Multiplanar CT image reconstructions were also generated. Multidetector CT imaging of the chest, abdomen and pelvis was performed following the standard protocol during bolus administration of intravenous contrast. CONTRAST:  111mL OMNIPAQUE IOHEXOL 300 MG/ML  SOLN COMPARISON:  CT head 03/30/2020, CT chest 03/26/2020, CT abdomen pelvis 10/07/2017 FINDINGS: CT HEAD FINDINGS Brain: Normal anatomic configuration of the brain. No evidence of acute intracranial hemorrhage or infarct. Mild parenchymal volume loss is commensurate with the patient's age. Mild periventricular white matter  changes are present likely reflecting the sequela of small vessel ischemia. No abnormal intra or extra-axial mass lesion. Left posterior fossa parasagittal arachnoid cyst is unchanged. Ventricular size is normal. Cerebellum is unremarkable. Vascular: No asymmetric hyperdense vasculature at the skull base. Skull: Intact Sinuses/Orbits: The paranasal sinuses are clear. The orbits are unremarkable. Other: Mastoid air cells and middle ear cavities are clear. CT CHEST FINDINGS Cardiovascular: Extensive coronary artery calcification predominantly within the a left anterior descending coronary artery. Global cardiac size within normal limits. Mild left ventricular hypertrophy noted, however. No pericardial effusion. The central pulmonary arteries are of normal caliber. Minimal atherosclerotic calcification is seen within the thoracic aorta. No aortic aneurysm. Mediastinum/Nodes: No pathologic thoracic adenopathy. Esophagus unremarkable. Thyroid unremarkable peer Lungs/Pleura: Diffuse bilateral interstitial and ground-glass pulmonary infiltrate is again noted and has largely improved in the interval since prior examination. Evaluation of the pulmonary parenchyma is slightly limited by motion artifact. There is, however, progressive volume loss identified with increasing reticulation at the lung bases possibly representing progressive atelectasis or developing inflammatory change. No pneumothorax or pleural effusion. Central airways are patent. Musculoskeletal: No acute bone abnormality. No lytic or blastic bone lesion. CT ABDOMEN PELVIS FINDINGS Hepatobiliary: No focal liver abnormality is seen. No gallstones, gallbladder wall thickening, or biliary dilatation. Pancreas: Unremarkable Spleen: Unremarkable Adrenals/Urinary Tract: The adrenal glands are unremarkable. The kidneys are normal in size and position. Multiple punctate nonobstructing calculi are noted within the kidneys bilaterally. Several larger calculi measuring  up to 10 mm are noted within the lower pole of the left kidney adjacent to a caliceal diverticulum. Simple cortical cyst noted within the interpolar region of the right kidney. The kidneys are otherwise unremarkable. No hydronephrosis. No ureteral calculi. The bladder is unremarkable. Stomach/Bowel: Severe sigmoid diverticulosis. The stomach, small bowel, and large bowel are otherwise unremarkable. Appendix normal. No free intraperitoneal gas or fluid. Vascular/Lymphatic: Mild aortoiliac atherosclerotic calcification. No aortic aneurysm. No pathologic adenopathy within the abdomen and pelvis. Reproductive: Prostate is unremarkable. Other: No abdominal wall hernia.  The rectum is unremarkable. Musculoskeletal: No acute bone abnormality. There has developed acquired fusion of the a L1 and L2 vertebral bodies with a mild resultant focal kyphotic deformity at this level. No superimposed osseous erosion noted. Right L2 hemilaminectomy has been performed. No lytic or blastic bone lesions. IMPRESSION: No acute intracranial abnormality. Diffuse interstitial and ground-glass pulmonary infiltrate has overall improved since prior examination, though there is progressive volume loss and bibasilar increasing reticulation, possibly representing atelectasis or progressive inflammatory change in this region. No central obstructing mass.  No acute intra-abdominal pathology identified. Bilateral nonobstructing nephrolithiasis. Severe sigmoid diverticulosis. Postsurgical changes within the lumbar spine with acquired fusion of L1-2. Aortic Atherosclerosis (ICD10-I70.0). Electronically Signed   By: Fidela Salisbury MD   On: 04/02/2020 23:48   DG Chest Port 1 View  Result Date: 04/03/2020 CLINICAL DATA:  COVID positive EXAM: PORTABLE CHEST 1 VIEW COMPARISON:  CT chest April 02, 2020 FINDINGS: The heart size and mediastinal contours are mildly enlarged. Hazy ground-glass interstitial airspace opacities bilaterally are again seen  throughout both lungs. There is prominence of the central pulmonary vasculature. No acute osseous abnormality. IMPRESSION: Hazy ground-glass airspace opacities seen throughout both lungs, unchanged is seen prior CT of April 02, 2020, likely consistent with multifocal pneumonia Electronically Signed   By: Prudencio Pair M.D.   On: 04/03/2020 00:45   DG Chest Port 1 View  Result Date: 03/30/2020 CLINICAL DATA:  Fever.  History of COPD. EXAM: PORTABLE CHEST 1 VIEW COMPARISON:  CT chest 03/26/2020. Single-view of the chest 03/25/2020. FINDINGS: Hazy multifocal airspace disease has worsened since the prior plain film. Heart size is normal. No pneumothorax or pleural effusion. Emphysema again noted. No acute or focal bony abnormality. IMPRESSION: Worsened multifocal pneumonia. Electronically Signed   By: Inge Rise M.D.   On: 03/30/2020 14:11   DG Chest Port 1 View  Result Date: 03/25/2020 CLINICAL DATA:  Possible sepsis.  Shortness of breath and confusion. EXAM: PORTABLE CHEST 1 VIEW COMPARISON:  PA and lateral chest and CT chest 03/16/2020. FINDINGS: Patchy bilateral airspace disease persists and appears slightly worse on the left. No pneumothorax or pleural fluid. Heart size is normal. No acute or focal bony abnormality. IMPRESSION: Patchy bilateral airspace disease consistent with pneumonia appears slightly worse on the left compared to the prior exam Electronically Signed   By: Inge Rise M.D.   On: 03/25/2020 14:58   VAS [place] LOWER EXTREMITY VENOUS (DVT)  Result Date: 03/31/2020  Lower Venous DVT Study Indications: Elevated Ddimer.  Risk Factors: COVID 19 positive. Comparison Study: No prior studies. Performing Technologist: Oliver Hum RVT  Examination Guidelines: A complete evaluation includes B-mode imaging, spectral Doppler, color Doppler, and power Doppler as needed of all accessible portions of each vessel. Bilateral testing is considered an integral part of a complete examination.  Limited examinations for reoccurring indications may be performed as noted. The reflux portion of the exam is performed with the patient in reverse Trendelenburg.  +---------+---------------+---------+-----------+----------+--------------+ RIGHT    CompressibilityPhasicitySpontaneityPropertiesThrombus Aging +---------+---------------+---------+-----------+----------+--------------+ CFV      Full           Yes      Yes                                 +---------+---------------+---------+-----------+----------+--------------+ SFJ      Full                                                        +---------+---------------+---------+-----------+----------+--------------+ FV Prox  Full                                                        +---------+---------------+---------+-----------+----------+--------------+  FV Mid   Full                                                        +---------+---------------+---------+-----------+----------+--------------+ FV DistalFull                                                        +---------+---------------+---------+-----------+----------+--------------+ PFV      Full                                                        +---------+---------------+---------+-----------+----------+--------------+ POP      Full           Yes      Yes                                 +---------+---------------+---------+-----------+----------+--------------+ PTV      Full                                                        +---------+---------------+---------+-----------+----------+--------------+ PERO     Full                                                        +---------+---------------+---------+-----------+----------+--------------+   +---------+---------------+---------+-----------+----------+--------------+ LEFT     CompressibilityPhasicitySpontaneityPropertiesThrombus Aging  +---------+---------------+---------+-----------+----------+--------------+ CFV      Full           Yes      Yes                                 +---------+---------------+---------+-----------+----------+--------------+ SFJ      Full                                                        +---------+---------------+---------+-----------+----------+--------------+ FV Prox  Full                                                        +---------+---------------+---------+-----------+----------+--------------+ FV Mid   Full                                                        +---------+---------------+---------+-----------+----------+--------------+   FV DistalFull                                                        +---------+---------------+---------+-----------+----------+--------------+ PFV      Full                                                        +---------+---------------+---------+-----------+----------+--------------+ POP      Full           Yes      Yes                                 +---------+---------------+---------+-----------+----------+--------------+ PTV      Full                                                        +---------+---------------+---------+-----------+----------+--------------+ PERO     Full                                                        +---------+---------------+---------+-----------+----------+--------------+     Summary: RIGHT: - There is no evidence of deep vein thrombosis in the lower extremity.  - No cystic structure found in the popliteal fossa.  LEFT: - There is no evidence of deep vein thrombosis in the lower extremity.  - No cystic structure found in the popliteal fossa.  *See table(s) above for measurements and observations. Electronically signed by Jamelle Haring on 03/31/2020 at 8:10:57 PM.    Final       Amaryllis Dyke, PA-C 04/03/20 [place], [place], DO 04/05/20  1636

## 2020-04-02 NOTE — ED Notes (Signed)
Pt to CT

## 2020-04-02 NOTE — ED Triage Notes (Signed)
Pt BIB EMS from [facility]. Pt was placed at [facility] last night after being DC from [place] for COVID pneumonia. EMS was originally called out for an overdose as pt daughter saw pt took unknown pills. EMS thinking sepsis as pt is hot to touch, tachy, etc.  Pt is lethargic, responds to voice and pain, oriented to name.  20 in RAC placed by EMS - got 500 NS en route  CBG 288 Temp 101 88% on RA - 98% NRB  End tidal 22 RR 35 HR 122 BP 152/96

## 2020-04-02 NOTE — ED Provider Notes (Signed)
[facility] EMERGENCY DEPARTMENT Provider Note   CSN: [id number] Arrival date & time: 04/02/20  2104     History Chief Complaint  Patient presents with  . Shortness of Breath    Tyrone Roman is a 75 y.o. male w/ h/o Barrett's esophagus, BPH, CAD w/ previous MI, COPD on RA, HTN, HLD, and RA who presents to the ED via EMS from rehab facility after recently discharged from hospital yesterday for concerns of sepsis. EMS reported patient febrile with temp 102F, tachycardic to 120s, hypoxic to 86% on RA, and altered. EMS placed patient on NRB with improvement of O2 sats to 90s. 500cc fluid bolus en route. Initial glucose 288. Upon arrival, patient opens eyes to command and states his name, but otherwise not responding to questions.  The history is provided by the patient, the EMS personnel and medical records.  Altered Mental Status Presenting symptoms: confusion, lethargy and partial responsiveness   Severity:  Severe Most recent episode:  Today Episode history:  Single Duration: earlier this evening. Timing:  Constant Progression:  Unchanged Chronicity:  New Context: nursing home resident and recent illness   Associated symptoms: abdominal pain, difficulty breathing and fever        Past Medical History:  Diagnosis Date  . Allergy   . Barrett's esophageal ulceration   . BPH (benign prostatic hyperplasia)   . CAD (coronary artery disease)    sees Dr. Mar Daring  . Cataract    both eyes removed   . Colonic polyp   . Concussion with loss of consciousness of 30 minutes or less   . Contact dermatitis   . Contact with or exposure to venereal diseases   . COPD (chronic obstructive pulmonary disease) ([place])    sees Dr. Kara Mead   . Diverticulosis of colon   . Dysphagia   . GERD (gastroesophageal reflux disease)    past hx- on meds   . HNP (herniated nucleus pulposus), lumbar    recurrent  . Hyperlipidemia   . Hypertension   . Laceration of scalp   .  Myocardial infarction [facility])    2011- stent placed  . Nephrolithiasis   . Pre-operative cardiovascular examination   . Rheumatoid arthritis [facility])    sees Dr. Gavin Pound   . SOB (shortness of breath)     Patient Active Problem List   Diagnosis Date Noted  . Acute respiratory failure with hypoxia ([place]) 03/28/2020  . Physical deconditioning 03/26/2020  . Normocytic anemia 03/26/2020  . Severe sepsis ([place]) 03/25/2020  . Multifocal pneumonia 03/17/2020  . COPD with acute exacerbation ([place]) 03/17/2020  . Hypoxia 03/17/2020  . CAD (coronary artery disease) 03/17/2020  . Thrombocytopenia ([place]) 03/08/2020  . Stage 3b chronic kidney disease ([place]) 03/08/2020  . Pneumonia due to COVID-19 virus 02/29/2020  . Acute respiratory failure due to COVID-19 ([place]) 02/29/2020  . Closed nondisplaced fracture of shaft of third metacarpal bone of left hand 03/04/2019  . Wound infection after surgery 01/01/2018  . Medication monitoring encounter 01/01/2018  . HNP (herniated nucleus pulposus), lumbar 10/07/2017  . Lumbar herniated disc 10/07/2017  . Psoriasis of scalp 03/27/2016  . Rheumatoid arthritis ([place]) 03/25/2013  . GI bleeding 02/20/2012  . Dyspnea on exertion 09/08/2010  . COPD (chronic obstructive pulmonary disease) ([place]) 08/04/2010  . Bruising 08/04/2010  . TINNITUS 12/16/2009  . Dizziness and giddiness 12/16/2009  . NECK SPRAIN AND STRAIN 10/21/2009  . LUMBAR SPRAIN AND STRAIN 10/21/2009  . Hyperlipidemia 06/10/2009  . Coronary  artery disease of native artery of native heart with stable angina pectoris ([place]) 06/10/2009  . GERD 06/10/2009  . BARRETTS ESOPHAGUS 06/10/2009  . CONCUSSION WITH LOC OF 30 MINUTES OR LESS 02/28/2009  . NEPHROLITHIASIS 07/01/2007  . CONTACT DERMATITIS 12/12/2006  . BPH (benign prostatic hyperplasia) 11/04/2006  . COLONIC POLYPS 10/21/2003  . DIVERTICULOSIS, COLON 10/21/2003    Past Surgical History:  Procedure Laterality Date  . BACK SURGERY    . CARDIAC  CATHETERIZATION N/A 01/19/2016   Procedure: Left Heart Cath and Coronary Angiography;  Surgeon: Burnell Blanks, MD;  Location: [place] [facility];  Service: Cardiovascular;  Laterality: N/A;  . COLONOSCOPY  12/09/2014   per Dr. Silverio Decamp, adenomatous polyps, repeat 3 years   . CORONARY ANGIOPLASTY WITH STENT PLACEMENT    . ESOPHAGOGASTRODUODENOSCOPY (EGD) WITH ESOPHAGEAL DILATION  02/17/09   Barretts esophagus   . EYE SURGERY    . KNEE ARTHROSCOPY Left X 2  . LUMBAR LAMINECTOMY/DECOMPRESSION MICRODISCECTOMY Right 10/07/2017   Procedure: RIGHT LUMBAR ONE-TWO LAMINECTOMY WITH MICRODISCECTOMY;  Surgeon: Consuella Lose, MD;  Location: [place];  Service: Neurosurgery;  Laterality: Right;  . LUMBAR LAMINECTOMY/DECOMPRESSION MICRODISCECTOMY N/A 12/06/2017   Procedure: RECURRENT MICRODISCECTOMY LUMBAR ONE - LUMBAR TWO;  Surgeon: Consuella Lose, MD;  Location: [place];  Service: Neurosurgery;  Laterality: N/A;  . LUMBAR WOUND DEBRIDEMENT N/A 12/11/2017   Procedure: LUMBAR WOUND DEBRIDEMENT;  Surgeon: Consuella Lose, MD;  Location: [place];  Service: Neurosurgery;  Laterality: N/A;  . POLYPECTOMY    . TONSILLECTOMY    . UPPER GASTROINTESTINAL ENDOSCOPY         Family History  Problem Relation Age of Onset  . Heart attack Father        cardiovascular disorder  . Arthritis Father        family hx  . Colon cancer Father        mets  . Prostate cancer Father        1st degree relative  . Arthritis Mother   . Colon polyps Mother   . Melanoma Mother   . Dementia Mother   . Diabetes Paternal Uncle   . Colon cancer Paternal Uncle   . Stomach cancer Paternal Uncle   . Melanoma Paternal Uncle   . Cancer Maternal Grandmother   . Skin cancer Daughter   . Colon polyps Sister   . Esophageal cancer Neg Hx   . Rectal cancer Neg Hx     Social History   Tobacco Use  . Smoking status: Former Smoker    Packs/day: 1.00    Years: 20.00    Pack years: 20.00    Types: Cigarettes    Quit  date: 10/12/1988    Years since quitting: 31.4  . Smokeless tobacco: Never Used  . Tobacco comment: 1960's   Vaping Use  . Vaping Use: Never used  Substance Use Topics  . Alcohol use: Yes    Alcohol/week: 0.0 standard drinks    Comment: rare  . Drug use: No    Home Medications Prior to Admission medications   Medication Sig Start Date End Date Taking? Authorizing Provider  acetaminophen (TYLENOL) 325 MG tablet Take 650 mg by mouth every 6 (six) hours as needed for headache.    [provider]  albuterol (VENTOLIN HFA) 108 (90 Base) MCG/ACT inhaler Inhale 2 puffs into the lungs every 4 (four) hours as needed for wheezing or shortness of breath. 03/21/20   [name], MD  amoxicillin-clavulanate (AUGMENTIN) 875-125 MG tablet  Take 1 tablet by mouth 2 (two) times daily for 3 days. 04/01/20 04/04/20  Alma Friendly, MD  benzonatate (TESSALON) 100 MG capsule Take 1 capsule (100 mg total) by mouth 3 (three) times daily. 04/01/20   Alma Friendly, MD  feeding supplement (ENSURE ENLIVE / ENSURE PLUS) LIQD Take 237 mLs by mouth 2 (two) times daily between meals. 04/01/20   Alma Friendly, MD  metoprolol succinate (TOPROL-XL) 25 MG 24 hr tablet Take 1 tablet (25 mg total) by mouth daily. 05/11/19   [name], MD  mometasone-formoterol (DULERA) 200-5 MCG/ACT AERO Inhale 2 puffs into the lungs 2 (two) times daily. 04/01/20   Alma Friendly, MD  nitroGLYCERIN (NITROSTAT) 0.4 MG SL tablet Place 1 tablet (0.4 mg total) under the tongue every 5 (five) minutes as needed for chest pain (3 doses max). Patient taking differently: Place 0.4 mg under the tongue every 5 (five) minutes as needed for chest pain (max 3 doses). 12/20/17   [name], MD  Nutritional Supplements (,FEEDING SUPPLEMENT, PROSOURCE PLUS) liquid Take 30 mLs by mouth 2 (two) times daily between meals. 04/01/20   Alma Friendly, MD  omeprazole (PRILOSEC) 40 MG capsule Take 1 capsule (40 mg total) by  mouth daily. 05/11/19   [name], MD  predniSONE (DELTASONE) 10 MG tablet Take 5 tablets (50 mg total) by mouth daily for 2 days, THEN 4 tablets (40 mg total) daily for 2 days, THEN 3 tablets (30 mg total) daily for 2 days, THEN 2 tablets (20 mg total) daily for 2 days, THEN 1 tablet (10 mg total) daily for 2 days. 04/01/20 04/11/20  Alma Friendly, MD  riTUXimab (RITUXAN) 500 MG/50ML injection Inject into the vein. Every 4 months    [provider]  simvastatin (ZOCOR) 40 MG tablet TAKE ONE TABLET BY MOUTH DAILY Patient taking differently: Take 40 mg by mouth daily at 6 PM. 09/07/19   [name], MD  tamsulosin (FLOMAX) 0.4 MG CAPS capsule Take 2 capsules (0.8 mg total) by mouth daily. Patient taking differently: Take 0.4 mg by mouth daily. 12/07/19   [name], MD  zolpidem (AMBIEN) 10 MG tablet TAKE ONE TABLET BY MOUTH EVERY NIGHT AT BEDTIME Patient taking differently: Take 10 mg by mouth at bedtime as needed for sleep. 12/18/19   [name], MD    Allergies    Cephalexin, Clarithromycin, Certolizumab pegol, Tocilizumab, Doxycycline, Hydroxyzine, Lidoderm, and Pyrithione zinc  Review of Systems   Review of Systems  Unable to perform ROS: Mental status change  Constitutional: Positive for fever.  Gastrointestinal: Positive for abdominal pain.  Psychiatric/Behavioral: Positive for confusion.    Physical Exam Updated Vital Signs Temp (!) 102.8 F (39.3 C) (Rectal)   SpO2 93%   Physical Exam Vitals and nursing note reviewed.  Constitutional:      General: He is in acute distress.     Appearance: He is well-developed and normal weight. He is ill-appearing.     Interventions: Nasal cannula in place.  HENT:     Head: Normocephalic and atraumatic.     Right Ear: External ear normal.     Left Ear: External ear normal.     Nose: Nose normal.     Mouth/Throat:     Mouth: Mucous membranes are dry.     Pharynx: Oropharynx is clear. No oropharyngeal exudate or  posterior oropharyngeal erythema.  Eyes:     General: No scleral icterus.  Right eye: No discharge.        Left eye: No discharge.     Extraocular Movements: Extraocular movements intact.     Conjunctiva/sclera: Conjunctivae normal.     Pupils: Pupils are equal, round, and reactive to light.  Cardiovascular:     Rate and Rhythm: Regular rhythm. Tachycardia present.     Pulses: Normal pulses.          Radial pulses are 2+ on the right side and 2+ on the left side.       Dorsalis pedis pulses are 2+ on the right side and 2+ on the left side.     Heart sounds: Normal heart sounds.     Comments: Extremities warm and well perfused. Pulmonary:     Effort: Pulmonary effort is normal. No respiratory distress.     Breath sounds: Rhonchi present.  Abdominal:     General: Abdomen is flat. There is no distension.     Palpations: Abdomen is soft.     Tenderness: There is generalized abdominal tenderness. There is no guarding or rebound. Negative signs include Murphy's sign, Rovsing's sign and McBurney's sign.  Musculoskeletal:     Right lower leg: No edema.     Left lower leg: No edema.  Skin:    General: Skin is warm and dry.     Findings: No rash.  Neurological:     General: No focal deficit present.     Mental Status: He is easily aroused. He is lethargic and disoriented.     GCS: GCS eye subscore is 3. GCS verbal subscore is 4. GCS motor subscore is 6.     Cranial Nerves: No facial asymmetry.     Motor: Motor function is intact.  Psychiatric:        Behavior: Behavior is cooperative.     ED Results / Procedures / Treatments   Labs (all labs ordered are listed, but only abnormal results are displayed) Labs Reviewed  LACTIC ACID, PLASMA - Abnormal; Notable for the following components:      Result Value   Lactic Acid, Venous 2.5 (*)    All other components within normal limits  COMPREHENSIVE METABOLIC PANEL - Abnormal; Notable for the following components:   Potassium 3.2  (*)    Glucose, Bld 208 (*)    Creatinine, Ser 1.29 (*)    Total Protein 5.2 (*)    Albumin 2.0 (*)    AST 116 (*)    ALT 74 (*)    GFR, Estimated 58 (*)    All other components within normal limits  CBC WITH DIFFERENTIAL/PLATELET - Abnormal; Notable for the following components:   Hemoglobin 12.0 (*)    HCT 36.9 (*)    nRBC 0.8 (*)    Lymphs Abs 0.2 (*)    Abs Immature Granulocytes 0.21 (*)    All other components within normal limits  I-STAT VENOUS BLOOD GAS, ED - Abnormal; Notable for the following components:   pH, Ven 7.485 (*)    pCO2, Ven 32.9 (*)    Potassium 3.2 (*)    Calcium, Ion 0.94 (*)    HCT 33.0 (*)    Hemoglobin 11.2 (*)    All other components within normal limits  TROPONIN I (HIGH SENSITIVITY) - Abnormal; Notable for the following components:   Troponin I (High Sensitivity) 120 (*)    All other components within normal limits  CULTURE, BLOOD (ROUTINE X 2)  CULTURE, BLOOD (ROUTINE X 2)  URINE CULTURE  PROTIME-INR  APTT  LIPASE, BLOOD  LACTIC ACID, PLASMA  URINALYSIS, ROUTINE W REFLEX MICROSCOPIC  TROPONIN I (HIGH SENSITIVITY)    EKG EKG Interpretation  Date/Time:  Saturday April 02 2020 21:05:29 EST Ventricular Rate:  129 PR Interval:    QRS Duration: 117 QT Interval:  319 QTC Calculation: 468 R Axis:   -93 Text Interpretation: Sinus tachycardia RBBB and LAFB ST elevation, consider inferior injury No significant change since last tracing Confirmed by Isla Pence [phone]) on 04/02/2020 9:36:33 PM   Radiology No results found.  Procedures Procedures  Medications Ordered in ED Medications  lactated ringers bolus 1,000 mL (1,000 mLs Intravenous New Bag/Given 04/02/20 2146)    And  lactated ringers bolus 1,000 mL (1,000 mLs Intravenous New Bag/Given 04/02/20 2147)    And  lactated ringers bolus 500 mL (has no administration in time range)  vancomycin (VANCOREADY) IVPB 1750 mg/350 mL (1,750 mg Intravenous New Bag/Given 04/02/20 2235)   piperacillin-tazobactam (ZOSYN) IVPB 3.375 g (3.375 g Intravenous New Bag/Given 04/02/20 2150)  acetaminophen (TYLENOL) tablet 1,000 mg (1,000 mg Oral Given 04/02/20 2151)  iohexol (OMNIPAQUE) 300 MG/ML solution 100 mL (100 mLs Intravenous Contrast Given 04/02/20 2328)    ED Course  I have reviewed the triage vital signs and the nursing notes.  Pertinent labs & imaging results that were available during my care of the patient were reviewed by me and considered in my medical decision making (see chart for details).    MDM Rules/Calculators/A&P                          Patient is a 19yoM w/ history and physical as described above who presents to the ED for fever, hypoxia, and AMS. Upon arrival patient appears toxic. Somnolent on exam but opens eyes to command and able to state his name; he otherwise is not responding to questions but will follow commands. VS notable to rectal temp 102.43F, tachycardic to 120s, normotensive, and satting 93% on 3L [place] with no acute respiratory distress. Sepsis workup intiated. Patient initially received 500cc fluid bolus prehospital; will give additional 2.5L bolus to complete 30cc/kg resuscitation. Empiric antibiotics initiated. Will also obtain CT head for AMS and CT chest abdomen pelvis given new O2 requirement and abdominal tenderness on exam.  Initial workup notable for K 3.2, Troponin 120, mildly elevated LFTs, and Cr 1.29. ECG reassuring; suspect elevated troponin likely 2/2 demand ischemic but will continue to trend and consult Cards if continues to uptrend. At time of sign out, CT imaging and CXR still pending. Differential diagnosis remains broad at this time and includes PNA, cholecystitis, ascending cholangitis, perforated bowel, diverticulitis, UTI, pyelonephritis, or other intraabdominal infection. Plan will be to follow up imaging and admit pending imaging results.  Transfer of care to [facility] PA-C at 2300. Please refer to her note regarding  further MDM and ultimate disposition. Patient in stable condition at time of transfer.  Final Clinical Impression(s) / ED Diagnoses Final diagnoses:  Altered mental status, unspecified altered mental status type  Hypoxia  Generalized abdominal pain  Fever, unspecified fever cause      Christy Gentles, MD 04/02/20 Darci Needle    Isla Pence, MD 04/03/20 [phone]

## 2020-04-02 NOTE — ED Notes (Signed)
[phone] pt daughter would like a update

## 2020-04-03 ENCOUNTER — Observation Stay (HOSPITAL_COMMUNITY): Payer: Medicare Other

## 2020-04-03 ENCOUNTER — Inpatient Hospital Stay (HOSPITAL_COMMUNITY): Payer: Medicare Other

## 2020-04-03 ENCOUNTER — Emergency Department (HOSPITAL_COMMUNITY): Payer: Medicare Other

## 2020-04-03 DIAGNOSIS — R7989 Other specified abnormal findings of blood chemistry: Secondary | ICD-10-CM

## 2020-04-03 DIAGNOSIS — R498 Other voice and resonance disorders: Secondary | ICD-10-CM | POA: Diagnosis not present

## 2020-04-03 DIAGNOSIS — Z87891 Personal history of nicotine dependence: Secondary | ICD-10-CM | POA: Diagnosis not present

## 2020-04-03 DIAGNOSIS — Z808 Family history of malignant neoplasm of other organs or systems: Secondary | ICD-10-CM | POA: Diagnosis not present

## 2020-04-03 DIAGNOSIS — U071 COVID-19: Secondary | ICD-10-CM | POA: Diagnosis not present

## 2020-04-03 DIAGNOSIS — Z8616 Personal history of COVID-19: Secondary | ICD-10-CM | POA: Diagnosis not present

## 2020-04-03 DIAGNOSIS — Z800 Family history of malignant neoplasm of digestive organs: Secondary | ICD-10-CM | POA: Diagnosis not present

## 2020-04-03 DIAGNOSIS — Z8261 Family history of arthritis: Secondary | ICD-10-CM | POA: Diagnosis not present

## 2020-04-03 DIAGNOSIS — N1832 Chronic kidney disease, stage 3b: Secondary | ICD-10-CM | POA: Diagnosis not present

## 2020-04-03 DIAGNOSIS — J8410 Pulmonary fibrosis, unspecified: Secondary | ICD-10-CM | POA: Diagnosis not present

## 2020-04-03 DIAGNOSIS — R1312 Dysphagia, oropharyngeal phase: Secondary | ICD-10-CM | POA: Diagnosis not present

## 2020-04-03 DIAGNOSIS — Z8371 Family history of colonic polyps: Secondary | ICD-10-CM | POA: Diagnosis not present

## 2020-04-03 DIAGNOSIS — Z833 Family history of diabetes mellitus: Secondary | ICD-10-CM | POA: Diagnosis not present

## 2020-04-03 DIAGNOSIS — Z8249 Family history of ischemic heart disease and other diseases of the circulatory system: Secondary | ICD-10-CM | POA: Diagnosis not present

## 2020-04-03 DIAGNOSIS — I5032 Chronic diastolic (congestive) heart failure: Secondary | ICD-10-CM | POA: Diagnosis not present

## 2020-04-03 DIAGNOSIS — Z7401 Bed confinement status: Secondary | ICD-10-CM | POA: Diagnosis not present

## 2020-04-03 DIAGNOSIS — R0602 Shortness of breath: Secondary | ICD-10-CM | POA: Diagnosis not present

## 2020-04-03 DIAGNOSIS — J189 Pneumonia, unspecified organism: Secondary | ICD-10-CM | POA: Diagnosis not present

## 2020-04-03 DIAGNOSIS — E785 Hyperlipidemia, unspecified: Secondary | ICD-10-CM | POA: Diagnosis present

## 2020-04-03 DIAGNOSIS — J9601 Acute respiratory failure with hypoxia: Secondary | ICD-10-CM

## 2020-04-03 DIAGNOSIS — R918 Other nonspecific abnormal finding of lung field: Secondary | ICD-10-CM | POA: Diagnosis not present

## 2020-04-03 DIAGNOSIS — N400 Enlarged prostate without lower urinary tract symptoms: Secondary | ICD-10-CM | POA: Diagnosis present

## 2020-04-03 DIAGNOSIS — R0902 Hypoxemia: Secondary | ICD-10-CM | POA: Diagnosis not present

## 2020-04-03 DIAGNOSIS — R2689 Other abnormalities of gait and mobility: Secondary | ICD-10-CM | POA: Diagnosis not present

## 2020-04-03 DIAGNOSIS — T1490XA Injury, unspecified, initial encounter: Secondary | ICD-10-CM | POA: Diagnosis not present

## 2020-04-03 DIAGNOSIS — M6259 Muscle wasting and atrophy, not elsewhere classified, multiple sites: Secondary | ICD-10-CM | POA: Diagnosis not present

## 2020-04-03 DIAGNOSIS — R4182 Altered mental status, unspecified: Secondary | ICD-10-CM | POA: Diagnosis not present

## 2020-04-03 DIAGNOSIS — Z888 Allergy status to other drugs, medicaments and biological substances status: Secondary | ICD-10-CM | POA: Diagnosis not present

## 2020-04-03 DIAGNOSIS — Z8042 Family history of malignant neoplasm of prostate: Secondary | ICD-10-CM | POA: Diagnosis not present

## 2020-04-03 DIAGNOSIS — I2510 Atherosclerotic heart disease of native coronary artery without angina pectoris: Secondary | ICD-10-CM | POA: Diagnosis not present

## 2020-04-03 DIAGNOSIS — M6281 Muscle weakness (generalized): Secondary | ICD-10-CM | POA: Diagnosis not present

## 2020-04-03 DIAGNOSIS — J690 Pneumonitis due to inhalation of food and vomit: Secondary | ICD-10-CM | POA: Diagnosis not present

## 2020-04-03 DIAGNOSIS — R41841 Cognitive communication deficit: Secondary | ICD-10-CM | POA: Diagnosis not present

## 2020-04-03 DIAGNOSIS — G9341 Metabolic encephalopathy: Secondary | ICD-10-CM | POA: Diagnosis not present

## 2020-04-03 DIAGNOSIS — I130 Hypertensive heart and chronic kidney disease with heart failure and stage 1 through stage 4 chronic kidney disease, or unspecified chronic kidney disease: Secondary | ICD-10-CM | POA: Diagnosis not present

## 2020-04-03 DIAGNOSIS — J449 Chronic obstructive pulmonary disease, unspecified: Secondary | ICD-10-CM | POA: Diagnosis not present

## 2020-04-03 DIAGNOSIS — J9600 Acute respiratory failure, unspecified whether with hypoxia or hypercapnia: Secondary | ICD-10-CM | POA: Diagnosis not present

## 2020-04-03 DIAGNOSIS — Z881 Allergy status to other antibiotic agents status: Secondary | ICD-10-CM | POA: Diagnosis not present

## 2020-04-03 DIAGNOSIS — R6520 Severe sepsis without septic shock: Secondary | ICD-10-CM | POA: Diagnosis not present

## 2020-04-03 DIAGNOSIS — M2550 Pain in unspecified joint: Secondary | ICD-10-CM | POA: Diagnosis not present

## 2020-04-03 DIAGNOSIS — M069 Rheumatoid arthritis, unspecified: Secondary | ICD-10-CM | POA: Diagnosis present

## 2020-04-03 DIAGNOSIS — I252 Old myocardial infarction: Secondary | ICD-10-CM | POA: Diagnosis not present

## 2020-04-03 DIAGNOSIS — Z79899 Other long term (current) drug therapy: Secondary | ICD-10-CM | POA: Diagnosis not present

## 2020-04-03 LAB — URINALYSIS, ROUTINE W REFLEX MICROSCOPIC
Bacteria, UA: NONE SEEN
Bilirubin Urine: NEGATIVE
Glucose, UA: NEGATIVE mg/dL
Ketones, ur: NEGATIVE mg/dL
Leukocytes,Ua: NEGATIVE
Nitrite: NEGATIVE
Protein, ur: NEGATIVE mg/dL
Specific Gravity, Urine: 1.016 (ref 1.005–1.030)
pH: 6 (ref 5.0–8.0)

## 2020-04-03 LAB — LACTIC ACID, PLASMA: Lactic Acid, Venous: 1.8 mmol/L (ref 0.5–1.9)

## 2020-04-03 LAB — COMPREHENSIVE METABOLIC PANEL
ALT: 61 U/L — ABNORMAL HIGH (ref 0–44)
AST: 92 U/L — ABNORMAL HIGH (ref 15–41)
Albumin: 1.7 g/dL — ABNORMAL LOW (ref 3.5–5.0)
Alkaline Phosphatase: 77 U/L (ref 38–126)
Anion gap: 9 (ref 5–15)
BUN: 18 mg/dL (ref 8–23)
CO2: 22 mmol/L (ref 22–32)
Calcium: 8.6 mg/dL — ABNORMAL LOW (ref 8.9–10.3)
Chloride: 107 mmol/L (ref 98–111)
Creatinine, Ser: 1.09 mg/dL (ref 0.61–1.24)
GFR, Estimated: >60 mL/min (ref >60)
Glucose, Bld: 168 mg/dL — ABNORMAL HIGH (ref 70–99)
Potassium: 3.2 mmol/L — ABNORMAL LOW (ref 3.5–5.1)
Sodium: 138 mmol/L (ref 135–145)
Total Bilirubin: 0.9 mg/dL (ref 0.3–1.2)
Total Protein: 4.4 g/dL — ABNORMAL LOW (ref 6.5–8.1)

## 2020-04-03 LAB — CBC WITH DIFFERENTIAL/PLATELET
Abs Immature Granulocytes: 0.24 10*3/uL — ABNORMAL HIGH (ref 0.00–0.07)
Basophils Absolute: 0 10*3/uL (ref 0.0–0.1)
Basophils Relative: 1 %
Eosinophils Absolute: 0 10*3/uL (ref 0.0–0.5)
Eosinophils Relative: 1 %
HCT: 31.1 % — ABNORMAL LOW (ref 39.0–52.0)
Hemoglobin: 10.7 g/dL — ABNORMAL LOW (ref 13.0–17.0)
Immature Granulocytes: 5 %
Lymphocytes Relative: 8 %
Lymphs Abs: 0.4 10*3/uL — ABNORMAL LOW (ref 0.7–4.0)
MCH: 29.8 pg (ref 26.0–34.0)
MCHC: 34.4 g/dL (ref 30.0–36.0)
MCV: 86.6 fL (ref 80.0–100.0)
Monocytes Absolute: 0.2 10*3/uL (ref 0.1–1.0)
Monocytes Relative: 4 %
Neutro Abs: 3.9 10*3/uL (ref 1.7–7.7)
Neutrophils Relative %: 81 %
Platelets: 205 10*3/uL (ref 150–400)
RBC: 3.59 MIL/uL — ABNORMAL LOW (ref 4.22–5.81)
RDW: 14.9 % (ref 11.5–15.5)
WBC: 4.8 10*3/uL (ref 4.0–10.5)
nRBC: 0.4 % — ABNORMAL HIGH (ref 0.0–0.2)

## 2020-04-03 LAB — PROCALCITONIN: Procalcitonin: <0.1 ng/mL

## 2020-04-03 LAB — C-REACTIVE PROTEIN: CRP: 7.1 mg/dL — ABNORMAL HIGH (ref <1.0)

## 2020-04-03 LAB — ECHOCARDIOGRAM LIMITED
Height: 74 in
Weight: 3329.83 oz

## 2020-04-03 LAB — BRAIN NATRIURETIC PEPTIDE: B Natriuretic Peptide: 463.6 pg/mL — ABNORMAL HIGH (ref 0.0–100.0)

## 2020-04-03 LAB — MAGNESIUM: Magnesium: 1.8 mg/dL (ref 1.7–2.4)

## 2020-04-03 LAB — TROPONIN I (HIGH SENSITIVITY): Troponin I (High Sensitivity): 73 ng/L — ABNORMAL HIGH (ref <18)

## 2020-04-03 LAB — D-DIMER, QUANTITATIVE: D-Dimer, Quant: 1.85 ug/mL-FEU — ABNORMAL HIGH (ref 0.00–0.50)

## 2020-04-03 MED ORDER — ALBUTEROL SULFATE HFA 108 (90 BASE) MCG/ACT IN AERS: 2.0000 | INHALATION_SPRAY | RESPIRATORY_TRACT | Status: DC | PRN

## 2020-04-03 MED ORDER — METOPROLOL SUCCINATE ER 25 MG PO TB24
25.0000 mg | ORAL_TABLET | Freq: Every day | ORAL | Status: DC
  Administered 2020-04-03: 25 mg via ORAL
  Filled 2020-04-03: qty 1

## 2020-04-03 MED ORDER — PANTOPRAZOLE SODIUM 40 MG PO TBEC
40.0000 mg | DELAYED_RELEASE_TABLET | Freq: Every day | ORAL | Status: DC
  Administered 2020-04-03 – 2020-04-06 (×4): 40 mg via ORAL
  Filled 2020-04-03 (×4): qty 1

## 2020-04-03 MED ORDER — SODIUM CHLORIDE 0.9 % IV SOLN: INTRAVENOUS | Status: DC

## 2020-04-03 MED ORDER — BENZONATATE 100 MG PO CAPS
100.0000 mg | ORAL_CAPSULE | Freq: Three times a day (TID) | ORAL | Status: DC
  Administered 2020-04-03 – 2020-04-06 (×10): 100 mg via ORAL
  Filled 2020-04-03 (×10): qty 1

## 2020-04-03 MED ORDER — PREDNISONE 20 MG PO TABS: 40.0000 mg | ORAL_TABLET | Freq: Every day | ORAL | Status: DC

## 2020-04-03 MED ORDER — ZOLPIDEM TARTRATE 5 MG PO TABS
10.0000 mg | ORAL_TABLET | Freq: Every evening | ORAL | Status: DC | PRN
  Administered 2020-04-03 – 2020-04-05 (×3): 10 mg via ORAL
  Filled 2020-04-03 (×3): qty 2

## 2020-04-03 MED ORDER — SIMVASTATIN 20 MG PO TABS
40.0000 mg | ORAL_TABLET | Freq: Every day | ORAL | Status: DC
  Administered 2020-04-03 – 2020-04-06 (×4): 40 mg via ORAL
  Filled 2020-04-03 (×5): qty 2

## 2020-04-03 MED ORDER — METHYLPREDNISOLONE SODIUM SUCC 40 MG IJ SOLR
40.0000 mg | Freq: Two times a day (BID) | INTRAMUSCULAR | Status: DC
  Administered 2020-04-03: 40 mg via INTRAVENOUS
  Filled 2020-04-03: qty 1

## 2020-04-03 MED ORDER — TAMSULOSIN HCL 0.4 MG PO CAPS
0.4000 mg | ORAL_CAPSULE | Freq: Every day | ORAL | Status: DC
  Administered 2020-04-03 – 2020-04-06 (×4): 0.4 mg via ORAL
  Filled 2020-04-03 (×4): qty 1

## 2020-04-03 MED ORDER — NITROGLYCERIN 0.4 MG SL SUBL: 0.4000 mg | SUBLINGUAL_TABLET | SUBLINGUAL | Status: DC | PRN

## 2020-04-03 MED ORDER — AMOXICILLIN-POT CLAVULANATE 875-125 MG PO TABS
1.0000 | ORAL_TABLET | Freq: Two times a day (BID) | ORAL | Status: DC
  Filled 2020-04-03: qty 1

## 2020-04-03 MED ORDER — PERFLUTREN LIPID MICROSPHERE
1.0000 mL | INTRAVENOUS | Status: AC | PRN
  Filled 2020-04-03: qty 10

## 2020-04-03 MED ORDER — METHYLPREDNISOLONE SODIUM SUCC 40 MG IJ SOLR
40.0000 mg | Freq: Every day | INTRAMUSCULAR | Status: DC
  Administered 2020-04-04 – 2020-04-05 (×2): 40 mg via INTRAVENOUS
  Filled 2020-04-03 (×2): qty 1

## 2020-04-03 MED ORDER — PREDNISONE 5 MG PO TABS: 30.0000 mg | ORAL_TABLET | Freq: Every day | ORAL | Status: DC

## 2020-04-03 MED ORDER — METOPROLOL SUCCINATE ER 25 MG PO TB24
25.0000 mg | ORAL_TABLET | Freq: Once | ORAL | Status: AC
  Administered 2020-04-03: 25 mg via ORAL
  Filled 2020-04-03: qty 1

## 2020-04-03 MED ORDER — ASPIRIN 81 MG PO CHEW
81.0000 mg | CHEWABLE_TABLET | Freq: Every day | ORAL | Status: DC
  Administered 2020-04-03 – 2020-04-06 (×4): 81 mg via ORAL
  Filled 2020-04-03 (×4): qty 1

## 2020-04-03 MED ORDER — POTASSIUM CHLORIDE 10 MEQ/100ML IV SOLN
10.0000 meq | INTRAVENOUS | Status: AC
  Administered 2020-04-03 (×3): 10 meq via INTRAVENOUS
  Filled 2020-04-03 (×3): qty 100

## 2020-04-03 MED ORDER — PREDNISONE 5 MG PO TABS: 10.0000 mg | ORAL_TABLET | Freq: Every day | ORAL | Status: DC

## 2020-04-03 MED ORDER — DIPHENHYDRAMINE HCL 25 MG PO CAPS
25.0000 mg | ORAL_CAPSULE | Freq: Four times a day (QID) | ORAL | Status: DC | PRN
  Administered 2020-04-03: 25 mg via ORAL
  Filled 2020-04-03: qty 1

## 2020-04-03 MED ORDER — MOMETASONE FURO-FORMOTEROL FUM 200-5 MCG/ACT IN AERO
2.0000 | INHALATION_SPRAY | Freq: Two times a day (BID) | RESPIRATORY_TRACT | Status: DC
  Administered 2020-04-03 – 2020-04-06 (×6): 2 via RESPIRATORY_TRACT
  Filled 2020-04-03 (×2): qty 8.8

## 2020-04-03 MED ORDER — POTASSIUM CHLORIDE CRYS ER 20 MEQ PO TBCR
40.0000 meq | EXTENDED_RELEASE_TABLET | Freq: Once | ORAL | Status: AC
  Administered 2020-04-03: 40 meq via ORAL
  Filled 2020-04-03: qty 2

## 2020-04-03 MED ORDER — SODIUM CHLORIDE 0.9 % IV SOLN
3.0000 g | Freq: Three times a day (TID) | INTRAVENOUS | Status: DC
  Administered 2020-04-03 – 2020-04-06 (×9): 3 g via INTRAVENOUS
  Filled 2020-04-03 (×4): qty 8
  Filled 2020-04-03: qty 3
  Filled 2020-04-03 (×3): qty 8
  Filled 2020-04-03: qty 3
  Filled 2020-04-03 (×2): qty 8
  Filled 2020-04-03: qty 3
  Filled 2020-04-03: qty 8

## 2020-04-03 MED ORDER — PREDNISONE 20 MG PO TABS: 20.0000 mg | ORAL_TABLET | Freq: Every day | ORAL | Status: DC

## 2020-04-03 MED ORDER — PREDNISONE 20 MG PO TABS: 50.0000 mg | ORAL_TABLET | Freq: Every day | ORAL | Status: DC

## 2020-04-03 MED ORDER — ENOXAPARIN SODIUM 40 MG/0.4ML ~~LOC~~ SOLN
40.0000 mg | Freq: Every day | SUBCUTANEOUS | Status: DC
  Administered 2020-04-03 – 2020-04-06 (×4): 40 mg via SUBCUTANEOUS
  Filled 2020-04-03 (×4): qty 0.4

## 2020-04-03 MED ORDER — METOPROLOL SUCCINATE ER 50 MG PO TB24
50.0000 mg | ORAL_TABLET | Freq: Every day | ORAL | Status: DC
  Administered 2020-04-04: 50 mg via ORAL
  Filled 2020-04-03: qty 1

## 2020-04-03 NOTE — Progress Notes (Signed)
Pt being followed by [facility] for sepsis protocol. 

## 2020-04-03 NOTE — NC FL2 (Signed)
[place] LEVEL OF CARE SCREENING TOOL     IDENTIFICATION  Patient Name: Tyrone Roman Birthdate: Feb 26, 1945 Sex: male Admission Date (Current Location): 04/02/2020  [facility] and [place] Number:  Herbalist and Address:  [facility], [place] [address] [place], [place] [address]      Provider Number: [id number]  Attending Physician Name and Address:  Thurnell Lose, MD  Relative Name and Phone Number:       Current Level of Care: Hospital Recommended Level of Care: [place] Prior Approval Number:    Date Approved/Denied:   PASRR Number: [id number]  Discharge Plan: SNF    Current Diagnoses: Patient Active Problem List   Diagnosis Date Noted  . Acute hypoxemic respiratory failure due to COVID-19 ([place]) 04/03/2020  . Acute respiratory failure with hypoxia ([place]) 03/28/2020  . Physical deconditioning 03/26/2020  . Normocytic anemia 03/26/2020  . Severe sepsis ([place]) 03/25/2020  . Multifocal pneumonia 03/17/2020  . COPD with acute exacerbation ([place]) 03/17/2020  . Hypoxia 03/17/2020  . CAD (coronary artery disease) 03/17/2020  . Thrombocytopenia ([place]) 03/08/2020  . Stage 3b chronic kidney disease ([place]) 03/08/2020  . Pneumonia due to COVID-19 virus 02/29/2020  . Acute respiratory failure due to COVID-19 ([place]) 02/29/2020  . Closed nondisplaced fracture of shaft of third metacarpal bone of left hand 03/04/2019  . Wound infection after surgery 01/01/2018  . Medication monitoring encounter 01/01/2018  . HNP (herniated nucleus pulposus), lumbar 10/07/2017  . Lumbar herniated disc 10/07/2017  . Psoriasis of scalp 03/27/2016  . Rheumatoid arthritis ([place]) 03/25/2013  . GI bleeding 02/20/2012  . Dyspnea on exertion 09/08/2010  . COPD (chronic obstructive pulmonary disease) ([place]) 08/04/2010  . Bruising 08/04/2010  . TINNITUS 12/16/2009  . Dizziness and giddiness 12/16/2009  . NECK SPRAIN AND STRAIN 10/21/2009  .  LUMBAR SPRAIN AND STRAIN 10/21/2009  . Hyperlipidemia 06/10/2009  . Coronary artery disease of native artery of native heart with stable angina pectoris ([place]) 06/10/2009  . GERD 06/10/2009  . BARRETTS ESOPHAGUS 06/10/2009  . CONCUSSION WITH LOC OF 30 MINUTES OR LESS 02/28/2009  . NEPHROLITHIASIS 07/01/2007  . CONTACT DERMATITIS 12/12/2006  . BPH (benign prostatic hyperplasia) 11/04/2006  . COLONIC POLYPS 10/21/2003  . DIVERTICULOSIS, COLON 10/21/2003    Orientation RESPIRATION BLADDER Height & Weight     Self,Situation  O2 (see DC summary) Incontinent Weight: 208 lb 1.8 oz (94.4 kg) Height:  6\' 2"  (188 cm)  BEHAVIORAL SYMPTOMS/MOOD NEUROLOGICAL BOWEL NUTRITION STATUS      Incontinent Diet (see DC summary)  AMBULATORY STATUS COMMUNICATION OF NEEDS Skin   Extensive Assist Verbally Other (Comment) (redness on sacrum, mepilex change every 3 days)                       Personal Care Assistance Level of Assistance  Bathing,Feeding,Dressing Bathing Assistance: Limited assistance Feeding assistance: Independent Dressing Assistance: Limited assistance     Functional Limitations Info  Sight,Hearing,Speech Sight Info: Impaired Hearing Info: Impaired Speech Info: Adequate    SPECIAL CARE FACTORS FREQUENCY  PT (By licensed PT),OT (By licensed OT)     PT Frequency: 5x/wk OT Frequency: 5x/wk            Contractures Contractures Info: Not present    Additional Factors Info  Code Status,Allergies,Isolation Precautions Code Status Info: Full Allergies Info: Cephalexin, Clarithromycin, Certolizumab Pegol, Tocilizumab, Doxycycline, Hydroxyzine, Lidoderm, Pyrithione Zinc     Isolation Precautions Info: Airborne/Contact: COVID-19  Current Medications (04/03/2020):  This is the current hospital active medication list Current Facility-Administered Medications  Medication Dose Route Frequency Provider Last Rate Last Admin  . 0.9 %  sodium chloride infusion   Intravenous  Continuous Kayleen Memos, DO 50 mL/hr at 04/03/20 0313 New Bag at 04/03/20 0313  . albuterol (VENTOLIN HFA) 108 (90 Base) MCG/ACT inhaler 2 puff  2 puff Inhalation Q4H PRN Irene Pap N, DO      . Ampicillin-Sulbactam (UNASYN) 3 g in sodium chloride 0.9 % 100 mL IVPB  3 g Intravenous Q8H Thurnell Lose, MD      . aspirin chewable tablet 81 mg  81 mg Oral Daily Thurnell Lose, MD      . benzonatate (TESSALON) capsule 100 mg  100 mg Oral TID Irene Pap N, DO   100 mg at 04/03/20 0941  . enoxaparin (LOVENOX) injection 40 mg  40 mg Subcutaneous Daily Irene Pap N, DO   40 mg at 04/03/20 0944  . [START ON 04/04/2020] methylPREDNISolone sodium succinate (SOLU-MEDROL) 40 mg/mL injection 40 mg  40 mg Intravenous Daily Lala Lund K, MD      . metoprolol succinate (TOPROL-XL) 24 hr tablet 25 mg  25 mg Oral Once Thurnell Lose, MD      . Derrill Memo ON 04/04/2020] metoprolol succinate (TOPROL-XL) 24 hr tablet 50 mg  50 mg Oral Daily Thurnell Lose, MD      . mometasone-formoterol [facility]) 200-5 MCG/ACT inhaler 2 puff  2 puff Inhalation BID Irene Pap N, DO   2 puff at 04/03/20 [phone]  . nitroGLYCERIN (NITROSTAT) SL tablet 0.4 mg  0.4 mg Sublingual Q5 min PRN Thurnell Lose, MD      . pantoprazole (PROTONIX) EC tablet 40 mg  40 mg Oral Daily Irene Pap N, DO   40 mg at 04/03/20 0941  . simvastatin (ZOCOR) tablet 40 mg  40 mg Oral Daily Irene Pap N, DO   40 mg at 04/03/20 0941  . tamsulosin (FLOMAX) capsule 0.4 mg  0.4 mg Oral Daily Irene Pap N, DO   0.4 mg at 04/03/20 0722     Discharge Medications: Please see discharge summary for a list of discharge medications.  Relevant Imaging Results:  Relevant Lab Results:   Additional Information SS#: [id number]  Geralynn Ochs, LCSW

## 2020-04-03 NOTE — Progress Notes (Signed)
SATURATION QUALIFICATIONS: (This note is used to comply with regulatory documentation for home oxygen)  Patient Saturations on Room Air at Rest = 93%  Patient Saturations on Room Air while Ambulating = 83%  Patient Saturations on 2 Liters of oxygen while Ambulating = 90%  Please briefly explain why patient needs home oxygen:

## 2020-04-03 NOTE — Plan of Care (Signed)
  Problem: Education: Goal: Knowledge of risk factors and measures for prevention of condition will improve Outcome: Progressing   Problem: Coping: Goal: Psychosocial and spiritual needs will be supported Outcome: Progressing   Problem: Respiratory: Goal: Will maintain a patent airway Outcome: Progressing Goal: Complications related to the disease process, condition or treatment will be avoided or minimized Outcome: Progressing   

## 2020-04-03 NOTE — H&P (Signed)
History and Physical  Tyrone Roman GQQ:[id number] DOB: 1945/07/17 DOA: 04/02/2020  Referring physician:  Kennith Maes, PA, EDP. PCP: [name], MD  Outpatient Specialists: Pulmonary. Patient coming from: SNF.  Chief Complaint: Shortness of breath.  HPI: Tyrone Roman is a 75 y.o. male with medical history significant for coronary artery disease, essential hypertension, hyperlipidemia, rheumatoid arthritis, BPH, COPD not on chronic oxygen, recently treated COVID-19 viral pneumonia superimposed by bacterial pneumonia who presented to [facility] ED from SNF due to shortness of breath, fever with T-max 102, tachycardia with heart rate in the 120s, altered mental status, and acute hypoxia with O2 saturation 86% on room air.  Patient was discharged from the hospital to SNF the day prior to his presentation, was not on oxygen supplementation at discharge.  History is obtained from EDP and from review of medical records as the patient is confused.  Sepsis work-up was initiated in the ED, patient received IV fluid boluses, protocol and empiric IV antibiotics, IV vancomycin and Zosyn.  CT head and CT chest/abdomen/pelvis are unremarkable for any acute findings.  Due to concern for sepsis, EDP requested admission.  ED Course:  At the time of this visit, BP 86/69, HR 78, RR19, O2 saturation 95% on 2 L.  CT scans head/chest/abdomen/pelvis with contrast unremarkable.  Review of Systems: Review of systems as noted in the HPI. All other systems reviewed and are negative.   Past Medical History:  Diagnosis Date  . Allergy   . Barrett's esophageal ulceration   . BPH (benign prostatic hyperplasia)   . CAD (coronary artery disease)    sees Dr. Mar Daring  . Cataract    both eyes removed   . Colonic polyp   . Concussion with loss of consciousness of 30 minutes or less   . Contact dermatitis   . Contact with or exposure to venereal diseases   . COPD (chronic obstructive pulmonary disease) ([place])     sees Dr. Kara Mead   . Diverticulosis of colon   . Dysphagia   . GERD (gastroesophageal reflux disease)    past hx- on meds   . HNP (herniated nucleus pulposus), lumbar    recurrent  . Hyperlipidemia   . Hypertension   . Laceration of scalp   . Myocardial infarction [facility])    2011- stent placed  . Nephrolithiasis   . Pre-operative cardiovascular examination   . Rheumatoid arthritis [facility])    sees Dr. Gavin Pound   . SOB (shortness of breath)    Past Surgical History:  Procedure Laterality Date  . BACK SURGERY    . CARDIAC CATHETERIZATION N/A 01/19/2016   Procedure: Left Heart Cath and Coronary Angiography;  Surgeon: Burnell Blanks, MD;  Location: [place] [facility];  Service: Cardiovascular;  Laterality: N/A;  . COLONOSCOPY  12/09/2014   per Dr. Silverio Decamp, adenomatous polyps, repeat 3 years   . CORONARY ANGIOPLASTY WITH STENT PLACEMENT    . ESOPHAGOGASTRODUODENOSCOPY (EGD) WITH ESOPHAGEAL DILATION  02/17/09   Barretts esophagus   . EYE SURGERY    . KNEE ARTHROSCOPY Left X 2  . LUMBAR LAMINECTOMY/DECOMPRESSION MICRODISCECTOMY Right 10/07/2017   Procedure: RIGHT LUMBAR ONE-TWO LAMINECTOMY WITH MICRODISCECTOMY;  Surgeon: Consuella Lose, MD;  Location: [place];  Service: Neurosurgery;  Laterality: Right;  . LUMBAR LAMINECTOMY/DECOMPRESSION MICRODISCECTOMY N/A 12/06/2017   Procedure: RECURRENT MICRODISCECTOMY LUMBAR ONE - LUMBAR TWO;  Surgeon: Consuella Lose, MD;  Location: [place];  Service: Neurosurgery;  Laterality: N/A;  . LUMBAR WOUND DEBRIDEMENT N/A 12/11/2017  Procedure: LUMBAR WOUND DEBRIDEMENT;  Surgeon: Consuella Lose, MD;  Location: [place];  Service: Neurosurgery;  Laterality: N/A;  . POLYPECTOMY    . TONSILLECTOMY    . UPPER GASTROINTESTINAL ENDOSCOPY      Social History:  reports that he quit smoking about 31 years ago. His smoking use included cigarettes. He has a 20.00 pack-year smoking history. He has never used smokeless tobacco. He reports current  alcohol use. He reports that he does not use drugs.   Allergies  Allergen Reactions  . Cephalexin Shortness Of Breath, Rash and Other (See Comments)    Tolerated Augmentin 2015 and 2017 (12-2017). Tolerated nafcillin 12/2017 PATIENT HAS HAD A PCN REACTION WITH IMMEDIATE RASH, FACIAL/TONGUE/THROAT SWELLING, SOB, OR LIGHTHEADEDNESS WITH HYPOTENSION:  #  #  YES  #  #  Has patient had a PCN reaction causing severe rash involving mucus membranes or skin necrosis: No Has patient had a PCN reaction that required hospitalization: No Has patient had a PCN reaction occurring within the last 10 years: No If all of the above answers are "NO", then may proceed  . Clarithromycin Shortness Of Breath and Rash  . Certolizumab Pegol Hives  . Tocilizumab Hives  . Doxycycline Rash  . Hydroxyzine Rash  . Lidoderm Other (See Comments)    "made me act weird"  . Pyrithione Zinc Rash    Family History  Problem Relation Age of Onset  . Heart attack Father        cardiovascular disorder  . Arthritis Father        family hx  . Colon cancer Father        mets  . Prostate cancer Father        1st degree relative  . Arthritis Mother   . Colon polyps Mother   . Melanoma Mother   . Dementia Mother   . Diabetes Paternal Uncle   . Colon cancer Paternal Uncle   . Stomach cancer Paternal Uncle   . Melanoma Paternal Uncle   . Cancer Maternal Grandmother   . Skin cancer Daughter   . Colon polyps Sister   . Esophageal cancer Neg Hx   . Rectal cancer Neg Hx       Prior to Admission medications   Medication Sig Start Date End Date Taking? Authorizing Provider  acetaminophen (TYLENOL) 325 MG tablet Take 650 mg by mouth every 6 (six) hours as needed for headache.    [provider]  albuterol (VENTOLIN HFA) 108 (90 Base) MCG/ACT inhaler Inhale 2 puffs into the lungs every 4 (four) hours as needed for wheezing or shortness of breath. 03/21/20   [name], MD  amoxicillin-clavulanate (AUGMENTIN)  875-125 MG tablet Take 1 tablet by mouth 2 (two) times daily for 3 days. 04/01/20 04/04/20  Alma Friendly, MD  benzonatate (TESSALON) 100 MG capsule Take 1 capsule (100 mg total) by mouth 3 (three) times daily. 04/01/20   Alma Friendly, MD  feeding supplement (ENSURE ENLIVE / ENSURE PLUS) LIQD Take 237 mLs by mouth 2 (two) times daily between meals. 04/01/20   Alma Friendly, MD  metoprolol succinate (TOPROL-XL) 25 MG 24 hr tablet Take 1 tablet (25 mg total) by mouth daily. 05/11/19   [name], MD  mometasone-formoterol (DULERA) 200-5 MCG/ACT AERO Inhale 2 puffs into the lungs 2 (two) times daily. 04/01/20   Alma Friendly, MD  nitroGLYCERIN (NITROSTAT) 0.4 MG SL tablet Place 1 tablet (0.4 mg total) under the tongue every 5 (  five) minutes as needed for chest pain (3 doses max). Patient taking differently: Place 0.4 mg under the tongue every 5 (five) minutes as needed for chest pain (max 3 doses). 12/20/17   [name], MD  Nutritional Supplements (,FEEDING SUPPLEMENT, PROSOURCE PLUS) liquid Take 30 mLs by mouth 2 (two) times daily between meals. 04/01/20   Alma Friendly, MD  omeprazole (PRILOSEC) 40 MG capsule Take 1 capsule (40 mg total) by mouth daily. 05/11/19   [name], MD  predniSONE (DELTASONE) 10 MG tablet Take 5 tablets (50 mg total) by mouth daily for 2 days, THEN 4 tablets (40 mg total) daily for 2 days, THEN 3 tablets (30 mg total) daily for 2 days, THEN 2 tablets (20 mg total) daily for 2 days, THEN 1 tablet (10 mg total) daily for 2 days. 04/01/20 04/11/20  Alma Friendly, MD  riTUXimab (RITUXAN) 500 MG/50ML injection Inject into the vein. Every 4 months    [provider]  simvastatin (ZOCOR) 40 MG tablet TAKE ONE TABLET BY MOUTH DAILY Patient taking differently: Take 40 mg by mouth daily at 6 PM. 09/07/19   [name], MD  tamsulosin (FLOMAX) 0.4 MG CAPS capsule Take 2 capsules (0.8 mg total) by mouth daily. Patient taking  differently: Take 0.4 mg by mouth daily. 12/07/19   [name], MD  zolpidem (AMBIEN) 10 MG tablet TAKE ONE TABLET BY MOUTH EVERY NIGHT AT BEDTIME Patient taking differently: Take 10 mg by mouth at bedtime as needed for sleep. 12/18/19   [name], MD    Physical Exam: BP 125/86   Pulse 87   Temp 97.8 F (36.6 C) (Axillary)   Resp 19   SpO2 94%   . General: 75 y.o. year-old male well developed well nourished in no acute distress.  Somnolent and confused. . Cardiovascular: Regular rate and rhythm with no rubs or gallops.  No thyromegaly or JVD noted.  No lower extremity edema bilaterally. Marland Kitchen Respiratory: Clear to auscultation with no wheezes or rales. Poor inspiratory effort. . Abdomen: Soft nontender nondistended with normal bowel sounds x4 quadrants. . Muskuloskeletal: No cyanosis, clubbing or edema noted bilaterally . Neuro: CN II-XII intact, strength, sensation, reflexes . Skin: No ulcerative lesions noted or rashes. . Psychiatry: Unable to assess mood due to altered mental status.         Labs on Admission:  Basic Metabolic Panel: Recent Labs  Lab 03/27/20 0923 03/28/20 0425 03/29/20 0439 03/30/20 0446 03/31/20 0430 04/01/20 0421 04/02/20 2131 04/02/20 2145  NA 132* 131* 133* 137 137 137 138 140  K 3.9 3.7 3.7 4.5 4.2 3.5 3.2* 3.2*  CL 101 102 102 103 106 107 106  --   CO2 23 21* 22 24 21* 20* 22  --   GLUCOSE 94 106* 97 115* 204* 319* 208*  --   BUN 15 14 17 20 23  32* 19  --   CREATININE 1.22 1.30* 1.32* 1.22 1.10 1.10 1.29*  --   CALCIUM 8.2* 8.6* 9.0 9.0 8.8* 8.7* 9.0  --   MG 1.5* 1.6* 1.7 1.8 1.9  --   --   --    Liver Function Tests: Recent Labs  Lab 04/02/20 2131  AST 116*  ALT 74*  ALKPHOS 97  BILITOT 0.8  PROT 5.2*  ALBUMIN 2.0*   Recent Labs  Lab 04/02/20 2131  LIPASE 42   No results for input(s): AMMONIA in the last 168 hours. CBC: Recent Labs  Lab  03/29/20 0439 03/30/20 0446 03/31/20 0430 04/01/20 0421 04/02/20 2131  04/02/20 2145  WBC 3.1* 3.7* 2.2* 4.0 5.0  --   NEUTROABS 2.4 2.9 1.9 3.4 4.3  --   HGB 11.2* 11.7* 10.2* 10.4* 12.0* 11.2*  HCT 34.1* 34.6* 31.8* 31.1* 36.9* 33.0*  MCV 88.6 86.7 89.1 87.6 87.4  --   PLT 99* 128* 129* 162 249  --    Cardiac Enzymes: No results for input(s): CKTOTAL, CKMB, CKMBINDEX, TROPONINI in the last 168 hours.  BNP (last 3 results) Recent Labs    03/16/20 1943 03/25/20 1508  BNP 39.6 46.5    ProBNP (last 3 results) No results for input(s): PROBNP in the last 8760 hours.  CBG: Recent Labs  Lab 03/31/20 0749 04/01/20 0835 04/01/20 1146 04/01/20 1716 04/01/20 2007  GLUCAP 191* 254* 318* 297* 269*    Radiological Exams on Admission: CT Head Wo Contrast  Result Date: 04/02/2020 CLINICAL DATA:  Altered mental status, sepsis, hypoxia, fever EXAM: CT HEAD WITHOUT CONTRAST CT CHEST, ABDOMEN AND PELVIS WITH CONTRAST TECHNIQUE: Contiguous axial images were obtained from the base of the skull through the vertex without intravenous contrast. Multiplanar CT image reconstructions were also generated. Multidetector CT imaging of the chest, abdomen and pelvis was performed following the standard protocol during bolus administration of intravenous contrast. CONTRAST:  143mL OMNIPAQUE IOHEXOL 300 MG/ML  SOLN COMPARISON:  CT head 03/30/2020, CT chest 03/26/2020, CT abdomen pelvis 10/07/2017 FINDINGS: CT HEAD FINDINGS Brain: Normal anatomic configuration of the brain. No evidence of acute intracranial hemorrhage or infarct. Mild parenchymal volume loss is commensurate with the patient's age. Mild periventricular white matter changes are present likely reflecting the sequela of small vessel ischemia. No abnormal intra or extra-axial mass lesion. Left posterior fossa parasagittal arachnoid cyst is unchanged. Ventricular size is normal. Cerebellum is unremarkable. Vascular: No asymmetric hyperdense vasculature at the skull base. Skull: Intact Sinuses/Orbits: The paranasal sinuses  are clear. The orbits are unremarkable. Other: Mastoid air cells and middle ear cavities are clear. CT CHEST FINDINGS Cardiovascular: Extensive coronary artery calcification predominantly within the a left anterior descending coronary artery. Global cardiac size within normal limits. Mild left ventricular hypertrophy noted, however. No pericardial effusion. The central pulmonary arteries are of normal caliber. Minimal atherosclerotic calcification is seen within the thoracic aorta. No aortic aneurysm. Mediastinum/Nodes: No pathologic thoracic adenopathy. Esophagus unremarkable. Thyroid unremarkable peer Lungs/Pleura: Diffuse bilateral interstitial and ground-glass pulmonary infiltrate is again noted and has largely improved in the interval since prior examination. Evaluation of the pulmonary parenchyma is slightly limited by motion artifact. There is, however, progressive volume loss identified with increasing reticulation at the lung bases possibly representing progressive atelectasis or developing inflammatory change. No pneumothorax or pleural effusion. Central airways are patent. Musculoskeletal: No acute bone abnormality. No lytic or blastic bone lesion. CT ABDOMEN PELVIS FINDINGS Hepatobiliary: No focal liver abnormality is seen. No gallstones, gallbladder wall thickening, or biliary dilatation. Pancreas: Unremarkable Spleen: Unremarkable Adrenals/Urinary Tract: The adrenal glands are unremarkable. The kidneys are normal in size and position. Multiple punctate nonobstructing calculi are noted within the kidneys bilaterally. Several larger calculi measuring up to 10 mm are noted within the lower pole of the left kidney adjacent to a caliceal diverticulum. Simple cortical cyst noted within the interpolar region of the right kidney. The kidneys are otherwise unremarkable. No hydronephrosis. No ureteral calculi. The bladder is unremarkable. Stomach/Bowel: Severe sigmoid diverticulosis. The stomach, small bowel,  and large bowel are otherwise unremarkable. Appendix normal. No free intraperitoneal gas or  fluid. Vascular/Lymphatic: Mild aortoiliac atherosclerotic calcification. No aortic aneurysm. No pathologic adenopathy within the abdomen and pelvis. Reproductive: Prostate is unremarkable. Other: No abdominal wall hernia.  The rectum is unremarkable. Musculoskeletal: No acute bone abnormality. There has developed acquired fusion of the a L1 and L2 vertebral bodies with a mild resultant focal kyphotic deformity at this level. No superimposed osseous erosion noted. Right L2 hemilaminectomy has been performed. No lytic or blastic bone lesions. IMPRESSION: No acute intracranial abnormality. Diffuse interstitial and ground-glass pulmonary infiltrate has overall improved since prior examination, though there is progressive volume loss and bibasilar increasing reticulation, possibly representing atelectasis or progressive inflammatory change in this region. No central obstructing mass. No acute intra-abdominal pathology identified. Bilateral nonobstructing nephrolithiasis. Severe sigmoid diverticulosis. Postsurgical changes within the lumbar spine with acquired fusion of L1-2. Aortic Atherosclerosis (ICD10-I70.0). Electronically Signed   By: Fidela Salisbury MD   On: 04/02/2020 23:48   CT CHEST ABDOMEN PELVIS W CONTRAST  Result Date: 04/02/2020 CLINICAL DATA:  Altered mental status, sepsis, hypoxia, fever EXAM: CT HEAD WITHOUT CONTRAST CT CHEST, ABDOMEN AND PELVIS WITH CONTRAST TECHNIQUE: Contiguous axial images were obtained from the base of the skull through the vertex without intravenous contrast. Multiplanar CT image reconstructions were also generated. Multidetector CT imaging of the chest, abdomen and pelvis was performed following the standard protocol during bolus administration of intravenous contrast. CONTRAST:  151mL OMNIPAQUE IOHEXOL 300 MG/ML  SOLN COMPARISON:  CT head 03/30/2020, CT chest 03/26/2020, CT abdomen  pelvis 10/07/2017 FINDINGS: CT HEAD FINDINGS Brain: Normal anatomic configuration of the brain. No evidence of acute intracranial hemorrhage or infarct. Mild parenchymal volume loss is commensurate with the patient's age. Mild periventricular white matter changes are present likely reflecting the sequela of small vessel ischemia. No abnormal intra or extra-axial mass lesion. Left posterior fossa parasagittal arachnoid cyst is unchanged. Ventricular size is normal. Cerebellum is unremarkable. Vascular: No asymmetric hyperdense vasculature at the skull base. Skull: Intact Sinuses/Orbits: The paranasal sinuses are clear. The orbits are unremarkable. Other: Mastoid air cells and middle ear cavities are clear. CT CHEST FINDINGS Cardiovascular: Extensive coronary artery calcification predominantly within the a left anterior descending coronary artery. Global cardiac size within normal limits. Mild left ventricular hypertrophy noted, however. No pericardial effusion. The central pulmonary arteries are of normal caliber. Minimal atherosclerotic calcification is seen within the thoracic aorta. No aortic aneurysm. Mediastinum/Nodes: No pathologic thoracic adenopathy. Esophagus unremarkable. Thyroid unremarkable peer Lungs/Pleura: Diffuse bilateral interstitial and ground-glass pulmonary infiltrate is again noted and has largely improved in the interval since prior examination. Evaluation of the pulmonary parenchyma is slightly limited by motion artifact. There is, however, progressive volume loss identified with increasing reticulation at the lung bases possibly representing progressive atelectasis or developing inflammatory change. No pneumothorax or pleural effusion. Central airways are patent. Musculoskeletal: No acute bone abnormality. No lytic or blastic bone lesion. CT ABDOMEN PELVIS FINDINGS Hepatobiliary: No focal liver abnormality is seen. No gallstones, gallbladder wall thickening, or biliary dilatation. Pancreas:  Unremarkable Spleen: Unremarkable Adrenals/Urinary Tract: The adrenal glands are unremarkable. The kidneys are normal in size and position. Multiple punctate nonobstructing calculi are noted within the kidneys bilaterally. Several larger calculi measuring up to 10 mm are noted within the lower pole of the left kidney adjacent to a caliceal diverticulum. Simple cortical cyst noted within the interpolar region of the right kidney. The kidneys are otherwise unremarkable. No hydronephrosis. No ureteral calculi. The bladder is unremarkable. Stomach/Bowel: Severe sigmoid diverticulosis. The stomach, small bowel, and large bowel are  otherwise unremarkable. Appendix normal. No free intraperitoneal gas or fluid. Vascular/Lymphatic: Mild aortoiliac atherosclerotic calcification. No aortic aneurysm. No pathologic adenopathy within the abdomen and pelvis. Reproductive: Prostate is unremarkable. Other: No abdominal wall hernia.  The rectum is unremarkable. Musculoskeletal: No acute bone abnormality. There has developed acquired fusion of the a L1 and L2 vertebral bodies with a mild resultant focal kyphotic deformity at this level. No superimposed osseous erosion noted. Right L2 hemilaminectomy has been performed. No lytic or blastic bone lesions. IMPRESSION: No acute intracranial abnormality. Diffuse interstitial and ground-glass pulmonary infiltrate has overall improved since prior examination, though there is progressive volume loss and bibasilar increasing reticulation, possibly representing atelectasis or progressive inflammatory change in this region. No central obstructing mass. No acute intra-abdominal pathology identified. Bilateral nonobstructing nephrolithiasis. Severe sigmoid diverticulosis. Postsurgical changes within the lumbar spine with acquired fusion of L1-2. Aortic Atherosclerosis (ICD10-I70.0). Electronically Signed   By: Fidela Salisbury MD   On: 04/02/2020 23:48   DG Chest Port 1 View  Result Date:  04/03/2020 CLINICAL DATA:  COVID positive EXAM: PORTABLE CHEST 1 VIEW COMPARISON:  CT chest April 02, 2020 FINDINGS: The heart size and mediastinal contours are mildly enlarged. Hazy ground-glass interstitial airspace opacities bilaterally are again seen throughout both lungs. There is prominence of the central pulmonary vasculature. No acute osseous abnormality. IMPRESSION: Hazy ground-glass airspace opacities seen throughout both lungs, unchanged is seen prior CT of April 02, 2020, likely consistent with multifocal pneumonia Electronically Signed   By: Prudencio Pair M.D.   On: 04/03/2020 00:45    EKG: I independently viewed the EKG done and my findings are as followed: Sinus tachycardia rate of 119 with no specific ST-T changes.  Assessment/Plan Present on Admission: . Acute hypoxemic respiratory failure due to COVID-19 [facility])  Active Problems:   Acute hypoxemic respiratory failure due to COVID-19 [facility])  Acute hypoxic respiratory failure secondary to recently diagnosed and treated COVID-19 viral pneumonia superimposed by bacterial pneumonia. Presented 1 day after discharge, was treated for above. Not on oxygen supplementation at baseline. Presented with O2 saturation of 84% on room air. Currently on 2 L with O2 saturation of 94%. Patient had a CT scan chest/abdomen/pelvis with contrast which were unremarkable for any acute findings. Maintain O2 saturation greater than 90% Will likely need DME oxygen at discharge, ordered. Obtain home O2 evaluation in the morning. TOC has been consulted to assist with SNF placement and DME oxygen.  Toxic Acute metabolic encephalopathy, suspect secondary to COVID-19 viral infection. CT head unremarkable for any acute intracranial findings Reorient as needed Fall, aspiration, and delirium precautions in place  Elevated troponin, likely demand ischemia secondary to tachycardia and hypoxemia Presented with troponin of 120, down trended.  Personally  reviewed twelve-lead EKG, no evidence of acute ischemia. Obtain limited 2D echo Monitor on telemetry  SIRS: Fever 102, HR 122, RR 30 Presented with above vital signs. Possibly related to recent COVID-19 viral infection. UA negative for pyuria CT chest/abdomen/pelvis non acute Blood cultures x2 pending, follow results. Hold off IV antibiotics for now Received IV fluid boluses, Zosyn and IV vancomycin in the ED as part of sepsis protocol. No leukocytosis Resume prior to admission Augmentin x3 days and continue to closely monitor vital signs. Obtain procalcitonin, CBC with differential in the morning.  Bilateral nonobstructive nephrolithiasis No evidence of hydronephrosis Start gentle IV fluid hydration 50 cc/hr normal salineX1 day. Monitor urine output and renal function.  Chronic diastolic CHF Last 2D echo done on 12/13/2017 revealed LVEF 55 to  60% with grade 1 diastolic dysfunction Prior to admission beta-blocker on hold to avoid hypotension. Start strict I's and O's and daily weights Obtain limited 2D echo.  Essential hypertension Hold off oral antihypertensives for now Monitor vital signs.  BPH Resume home Flomax Monitor urine output.  Rheumatoid arthritis He is on Rituxan every 4 months  Recently treated COVID-19 viral pneumonia superimposed by bacterial pneumonia Resume home bronchodilators Resume home Augmentin to complete treatment. He is on prior to admission steroid taper, continue.  Physical debility Recently discharged to SNF the day prior to his presentation Resume PT OT with assistance and fall precautions TOC consulted to assist with return to SNF    DVT prophylaxis: Subcu Lovenox daily  Code Status: Full code, stated by EDP.  Family Communication: None at bedside.  Disposition Plan: Admit to telemetry medical.  Consults called: None.  Admission status: Observation status.   Status is: Observation    Dispo:  Patient From: [place]  Planned Disposition: [place]              Expected discharge date: 04/04/2020.  Medically stable for discharge: No, ongoing management of altered mental status.      Kayleen Memos MD [facility] Pager [phone]  If 7PM-7AM, please contact night-coverage [email or web address] Password [email or web address]  04/03/2020, 1:23 AM

## 2020-04-03 NOTE — ED Provider Notes (Incomplete)
23:20: Assumed care of patient from Dr. Lanny Hurst & Dr. Gilford Raid @ change of shift pending CT imaging & consult for admission.   Please see prior provider note for full H&P.  Briefly patient is a 75 yo male w/ extensive pmhx including CAD, COPD, hypertension, hyperlipidemia, and rheumatoid arthritis who returned to the ED after discharge from the hospital yesterday for AMS. Recent admission for pneumonia w/ hypoxia/sepsis, discharged yesterday off oxygen, returned with AMS, concern for sepsis- febrile, and with return of 2L oxygen requirement.   Has received antibiotics as well as 30 cc/kg bolus for sepsis. Labs were notable for mildly elevated lactic acid, normal white blood cell count, anemia similar to prior ranges, elevated troponin suspect to be secondary to demand.  Patient care signed out to me pending CT imaging and admission, per prior did not appear to require lumbar puncture, no meningismus, also pending UA.   CT imaging without acute process, additional findings as below.  UA without UTI.   01:15: CONSULT: Discussed with   ED Course/Procedures     Results for orders placed or performed during the hospital encounter of 04/02/20  Lactic acid, plasma  Result Value Ref Range   Lactic Acid, Venous 2.5 (HH) 0.5 - 1.9 mmol/L  Comprehensive metabolic panel  Result Value Ref Range   Sodium 138 135 - 145 mmol/L   Potassium 3.2 (L) 3.5 - 5.1 mmol/L   Chloride 106 98 - 111 mmol/L   CO2 22 22 - 32 mmol/L   Glucose, Bld 208 (H) 70 - 99 mg/dL   BUN 19 8 - 23 mg/dL   Creatinine, Ser 1.29 (H) 0.61 - 1.24 mg/dL   Calcium 9.0 8.9 - 10.3 mg/dL   Total Protein 5.2 (L) 6.5 - 8.1 g/dL   Albumin 2.0 (L) 3.5 - 5.0 g/dL   AST 116 (H) 15 - 41 U/L   ALT 74 (H) 0 - 44 U/L   Alkaline Phosphatase 97 38 - 126 U/L   Total Bilirubin 0.8 0.3 - 1.2 mg/dL   GFR, Estimated 58 (L) >60 mL/min   Anion gap 10 5 - 15  CBC WITH DIFFERENTIAL  Result Value Ref Range   WBC 5.0 4.0 - 10.5 K/uL   RBC 4.22 4.22 -  5.81 MIL/uL   Hemoglobin 12.0 (L) 13.0 - 17.0 g/dL   HCT 36.9 (L) 39.0 - 52.0 %   MCV 87.4 80.0 - 100.0 fL   MCH 28.4 26.0 - 34.0 pg   MCHC 32.5 30.0 - 36.0 g/dL   RDW 15.0 11.5 - 15.5 %   Platelets 249 150 - 400 K/uL   nRBC 0.8 (H) 0.0 - 0.2 %   Neutrophils Relative % 88 %   Neutro Abs 4.3 1.7 - 7.7 K/uL   Lymphocytes Relative 4 %   Lymphs Abs 0.2 (L) 0.7 - 4.0 K/uL   Monocytes Relative 4 %   Monocytes Absolute 0.2 0.1 - 1.0 K/uL   Eosinophils Relative 0 %   Eosinophils Absolute 0.0 0.0 - 0.5 K/uL   Basophils Relative 0 %   Basophils Absolute 0.0 0.0 - 0.1 K/uL   Immature Granulocytes 4 %   Abs Immature Granulocytes 0.21 (H) 0.00 - 0.07 K/uL  Protime-INR  Result Value Ref Range   Prothrombin Time 14.8 11.4 - 15.2 seconds   INR 1.2 0.8 - 1.2  APTT  Result Value Ref Range   aPTT 33 24 - 36 seconds  Lipase, blood  Result Value Ref Range   Lipase 42  11 - 51 U/L  I-Stat venous blood gas, [facility] ED)  Result Value Ref Range   pH, Ven 7.485 (H) 7.250 - 7.430   pCO2, Ven 32.9 (L) 44.0 - 60.0 mmHg   pO2, Ven 41.0 32.0 - 45.0 mmHg   Bicarbonate 24.7 20.0 - 28.0 mmol/L   TCO2 26 22 - 32 mmol/L   O2 Saturation 81.0 %   Acid-Base Excess 2.0 0.0 - 2.0 mmol/L   Sodium 140 135 - 145 mmol/L   Potassium 3.2 (L) 3.5 - 5.1 mmol/L   Calcium, Ion 0.94 (L) 1.15 - 1.40 mmol/L   HCT 33.0 (L) 39.0 - 52.0 %   Hemoglobin 11.2 (L) 13.0 - 17.0 g/dL   Sample type VENOUS   Troponin I (High Sensitivity)  Result Value Ref Range   Troponin I (High Sensitivity) 120 (HH) <18 ng/L   DG Chest 2 View  Result Date: 03/16/2020 CLINICAL DATA:  Chest pain, COVID-19 positive. EXAM: CHEST - 2 VIEW COMPARISON:  February 29, 2020. FINDINGS: The heart size and mediastinal contours are within normal limits. No pneumothorax or pleural effusion is noted. Multiple patchy airspace opacities are noted bilaterally consistent with multifocal pneumonia. The visualized skeletal structures are unremarkable. IMPRESSION: Bilateral  multifocal pneumonia. Electronically Signed   By: Marijo Conception M.D.   On: 03/16/2020 13:58   CT Head Wo Contrast  Result Date: 04/02/2020 CLINICAL DATA:  Altered mental status, sepsis, hypoxia, fever EXAM: CT HEAD WITHOUT CONTRAST CT CHEST, ABDOMEN AND PELVIS WITH CONTRAST TECHNIQUE: Contiguous axial images were obtained from the base of the skull through the vertex without intravenous contrast. Multiplanar CT image reconstructions were also generated. Multidetector CT imaging of the chest, abdomen and pelvis was performed following the standard protocol during bolus administration of intravenous contrast. CONTRAST:  15mL OMNIPAQUE IOHEXOL 300 MG/ML  SOLN COMPARISON:  CT head 03/30/2020, CT chest 03/26/2020, CT abdomen pelvis 10/07/2017 FINDINGS: CT HEAD FINDINGS Brain: Normal anatomic configuration of the brain. No evidence of acute intracranial hemorrhage or infarct. Mild parenchymal volume loss is commensurate with the patient's age. Mild periventricular white matter changes are present likely reflecting the sequela of small vessel ischemia. No abnormal intra or extra-axial mass lesion. Left posterior fossa parasagittal arachnoid cyst is unchanged. Ventricular size is normal. Cerebellum is unremarkable. Vascular: No asymmetric hyperdense vasculature at the skull base. Skull: Intact Sinuses/Orbits: The paranasal sinuses are clear. The orbits are unremarkable. Other: Mastoid air cells and middle ear cavities are clear. CT CHEST FINDINGS Cardiovascular: Extensive coronary artery calcification predominantly within the a left anterior descending coronary artery. Global cardiac size within normal limits. Mild left ventricular hypertrophy noted, however. No pericardial effusion. The central pulmonary arteries are of normal caliber. Minimal atherosclerotic calcification is seen within the thoracic aorta. No aortic aneurysm. Mediastinum/Nodes: No pathologic thoracic adenopathy. Esophagus unremarkable. Thyroid  unremarkable peer Lungs/Pleura: Diffuse bilateral interstitial and ground-glass pulmonary infiltrate is again noted and has largely improved in the interval since prior examination. Evaluation of the pulmonary parenchyma is slightly limited by motion artifact. There is, however, progressive volume loss identified with increasing reticulation at the lung bases possibly representing progressive atelectasis or developing inflammatory change. No pneumothorax or pleural effusion. Central airways are patent. Musculoskeletal: No acute bone abnormality. No lytic or blastic bone lesion. CT ABDOMEN PELVIS FINDINGS Hepatobiliary: No focal liver abnormality is seen. No gallstones, gallbladder wall thickening, or biliary dilatation. Pancreas: Unremarkable Spleen: Unremarkable Adrenals/Urinary Tract: The adrenal glands are unremarkable. The kidneys are normal in size and position. Multiple punctate  nonobstructing calculi are noted within the kidneys bilaterally. Several larger calculi measuring up to 10 mm are noted within the lower pole of the left kidney adjacent to a caliceal diverticulum. Simple cortical cyst noted within the interpolar region of the right kidney. The kidneys are otherwise unremarkable. No hydronephrosis. No ureteral calculi. The bladder is unremarkable. Stomach/Bowel: Severe sigmoid diverticulosis. The stomach, small bowel, and large bowel are otherwise unremarkable. Appendix normal. No free intraperitoneal gas or fluid. Vascular/Lymphatic: Mild aortoiliac atherosclerotic calcification. No aortic aneurysm. No pathologic adenopathy within the abdomen and pelvis. Reproductive: Prostate is unremarkable. Other: No abdominal wall hernia.  The rectum is unremarkable. Musculoskeletal: No acute bone abnormality. There has developed acquired fusion of the a L1 and L2 vertebral bodies with a mild resultant focal kyphotic deformity at this level. No superimposed osseous erosion noted. Right L2 hemilaminectomy has been  performed. No lytic or blastic bone lesions. IMPRESSION: No acute intracranial abnormality. Diffuse interstitial and ground-glass pulmonary infiltrate has overall improved since prior examination, though there is progressive volume loss and bibasilar increasing reticulation, possibly representing atelectasis or progressive inflammatory change in this region. No central obstructing mass. No acute intra-abdominal pathology identified. Bilateral nonobstructing nephrolithiasis. Severe sigmoid diverticulosis. Postsurgical changes within the lumbar spine with acquired fusion of L1-2. Aortic Atherosclerosis (ICD10-I70.0). Electronically Signed   By: Fidela Salisbury MD   On: 04/02/2020 23:48   CT HEAD WO CONTRAST  Result Date: 03/30/2020 CLINICAL DATA:  Mental status changes of unknown etiology. Coronavirus pneumonia. EXAM: CT HEAD WITHOUT CONTRAST TECHNIQUE: Contiguous axial images were obtained from the base of the skull through the vertex without intravenous contrast. COMPARISON:  MRI 12/13/2017 FINDINGS: Brain: No change since prior appearances. Mild age related volume loss. No sign of acute infarction, mass lesion, hemorrhage, hydrocephalus or extra-axial collection. Incidental mega cisterna magna. Vascular: There is atherosclerotic calcification of the major vessels at the base of the brain. Skull: Negative Sinuses/Orbits: Clear/normal Other: None IMPRESSION: No acute or reversible finding. Mild age related volume loss. Atherosclerotic calcification of the major vessels at the base of the brain. Electronically Signed   By: Nelson Chimes M.D.   On: 03/30/2020 19:15   CT ANGIO CHEST PE W OR WO CONTRAST  Result Date: 03/26/2020 CLINICAL DATA:  Shortness of breath.  COVID positive. EXAM: CT ANGIOGRAPHY CHEST WITH CONTRAST TECHNIQUE: Multidetector CT imaging of the chest was performed using the standard protocol during bolus administration of intravenous contrast. Multiplanar CT image reconstructions and MIPs were  obtained to evaluate the vascular anatomy. CONTRAST:  120mL OMNIPAQUE IOHEXOL 350 MG/ML SOLN COMPARISON:  03/16/2020 FINDINGS: Cardiovascular: Examination is limited by respiratory motion artifact. Within that limitation, there is no central pulmonary embolus. The size of the main pulmonary artery is normal. Normal heart size with coronary artery calcification. The course and caliber of the aorta are normal. There is mild atherosclerotic calcification. Opacification decreased due to pulmonary arterial phase contrast bolus timing. Mediastinum/Nodes: -- No mediastinal lymphadenopathy. -- No hilar lymphadenopathy. -- No axillary lymphadenopathy. -- No supraclavicular lymphadenopathy. -- Normal thyroid gland where visualized. -  Unremarkable esophagus. Lungs/Pleura: There are trace bilateral pleural effusions. There are diffuse bilateral ground-glass airspace opacities. Emphysematous changes are suspected at the lung apices. There are pleural based calcified and noncalcified plaques. Overall, the airspace disease has worsened since the prior study. There is no pneumothorax. The trachea is unremarkable. There are trace bilateral pleural effusions. Upper Abdomen: Contrast bolus timing is not optimized for evaluation of the abdominal organs. The visualized portions of the  organs of the upper abdomen are normal. Musculoskeletal: No chest wall abnormality. No bony spinal canal stenosis. Review of the MIP images confirms the above findings. IMPRESSION: 1. Examination is limited by respiratory motion artifact. Within that limitation, there is no central pulmonary embolus. 2. Worsening bilateral ground-glass airspace opacities, consistent with the patient's history of viral pneumonia. 3. Trace bilateral pleural effusions. 4. Calcified and noncalcified pleural plaques, consistent with prior asbestos exposure. Aortic Atherosclerosis (ICD10-I70.0) and Emphysema (ICD10-J43.9). Electronically Signed   By: Constance Holster M.D.    On: 03/26/2020 17:23   CT Angio Chest PE W and/or Wo Contrast  Result Date: 03/16/2020 CLINICAL DATA:  COVID, chest pain.  Concern for PE. EXAM: CT ANGIOGRAPHY CHEST WITH CONTRAST TECHNIQUE: Multidetector CT imaging of the chest was performed using the standard protocol during bolus administration of intravenous contrast. Multiplanar CT image reconstructions and MIPs were obtained to evaluate the vascular anatomy. CONTRAST:  63mL OMNIPAQUE IOHEXOL 300 MG/ML  SOLN COMPARISON:  11/27/2010 FINDINGS: Cardiovascular: No filling defects in the pulmonary arteries to suggest pulmonary emboli. Heart is normal size. Aorta is normal caliber. Coronary artery and aortic calcifications. Mediastinum/Nodes: No mediastinal, hilar, or axillary adenopathy. Trachea and esophagus are unremarkable. Thyroid unremarkable. Lungs/Pleura: Patchy peripheral bilateral airspace opacities, favor related to COVID pneumonia. Mild centrilobular emphysema. No effusions. Upper Abdomen: Insert for abdomen Musculoskeletal: Chest wall soft tissues are unremarkable. No acute bony abnormality. Review of the MIP images confirms the above findings. IMPRESSION: No evidence of pulmonary embolus. Patchy bilateral peripheral airspace opacities most compatible with COVID pneumonia. Coronary artery disease. Aortic Atherosclerosis (ICD10-I70.0) and Emphysema (ICD10-J43.9). Electronically Signed   By: Rolm Baptise M.D.   On: 03/16/2020 20:49   CT CHEST ABDOMEN PELVIS W CONTRAST  Result Date: 04/02/2020 CLINICAL DATA:  Altered mental status, sepsis, hypoxia, fever EXAM: CT HEAD WITHOUT CONTRAST CT CHEST, ABDOMEN AND PELVIS WITH CONTRAST TECHNIQUE: Contiguous axial images were obtained from the base of the skull through the vertex without intravenous contrast. Multiplanar CT image reconstructions were also generated. Multidetector CT imaging of the chest, abdomen and pelvis was performed following the standard protocol during bolus administration of intravenous  contrast. CONTRAST:  167mL OMNIPAQUE IOHEXOL 300 MG/ML  SOLN COMPARISON:  CT head 03/30/2020, CT chest 03/26/2020, CT abdomen pelvis 10/07/2017 FINDINGS: CT HEAD FINDINGS Brain: Normal anatomic configuration of the brain. No evidence of acute intracranial hemorrhage or infarct. Mild parenchymal volume loss is commensurate with the patient's age. Mild periventricular white matter changes are present likely reflecting the sequela of small vessel ischemia. No abnormal intra or extra-axial mass lesion. Left posterior fossa parasagittal arachnoid cyst is unchanged. Ventricular size is normal. Cerebellum is unremarkable. Vascular: No asymmetric hyperdense vasculature at the skull base. Skull: Intact Sinuses/Orbits: The paranasal sinuses are clear. The orbits are unremarkable. Other: Mastoid air cells and middle ear cavities are clear. CT CHEST FINDINGS Cardiovascular: Extensive coronary artery calcification predominantly within the a left anterior descending coronary artery. Global cardiac size within normal limits. Mild left ventricular hypertrophy noted, however. No pericardial effusion. The central pulmonary arteries are of normal caliber. Minimal atherosclerotic calcification is seen within the thoracic aorta. No aortic aneurysm. Mediastinum/Nodes: No pathologic thoracic adenopathy. Esophagus unremarkable. Thyroid unremarkable peer Lungs/Pleura: Diffuse bilateral interstitial and ground-glass pulmonary infiltrate is again noted and has largely improved in the interval since prior examination. Evaluation of the pulmonary parenchyma is slightly limited by motion artifact. There is, however, progressive volume loss identified with increasing reticulation at the lung bases possibly representing progressive atelectasis or  developing inflammatory change. No pneumothorax or pleural effusion. Central airways are patent. Musculoskeletal: No acute bone abnormality. No lytic or blastic bone lesion. CT ABDOMEN PELVIS FINDINGS  Hepatobiliary: No focal liver abnormality is seen. No gallstones, gallbladder wall thickening, or biliary dilatation. Pancreas: Unremarkable Spleen: Unremarkable Adrenals/Urinary Tract: The adrenal glands are unremarkable. The kidneys are normal in size and position. Multiple punctate nonobstructing calculi are noted within the kidneys bilaterally. Several larger calculi measuring up to 10 mm are noted within the lower pole of the left kidney adjacent to a caliceal diverticulum. Simple cortical cyst noted within the interpolar region of the right kidney. The kidneys are otherwise unremarkable. No hydronephrosis. No ureteral calculi. The bladder is unremarkable. Stomach/Bowel: Severe sigmoid diverticulosis. The stomach, small bowel, and large bowel are otherwise unremarkable. Appendix normal. No free intraperitoneal gas or fluid. Vascular/Lymphatic: Mild aortoiliac atherosclerotic calcification. No aortic aneurysm. No pathologic adenopathy within the abdomen and pelvis. Reproductive: Prostate is unremarkable. Other: No abdominal wall hernia.  The rectum is unremarkable. Musculoskeletal: No acute bone abnormality. There has developed acquired fusion of the a L1 and L2 vertebral bodies with a mild resultant focal kyphotic deformity at this level. No superimposed osseous erosion noted. Right L2 hemilaminectomy has been performed. No lytic or blastic bone lesions. IMPRESSION: No acute intracranial abnormality. Diffuse interstitial and ground-glass pulmonary infiltrate has overall improved since prior examination, though there is progressive volume loss and bibasilar increasing reticulation, possibly representing atelectasis or progressive inflammatory change in this region. No central obstructing mass. No acute intra-abdominal pathology identified. Bilateral nonobstructing nephrolithiasis. Severe sigmoid diverticulosis. Postsurgical changes within the lumbar spine with acquired fusion of L1-2. Aortic Atherosclerosis  (ICD10-I70.0). Electronically Signed   By: Fidela Salisbury MD   On: 04/02/2020 23:48   DG Chest Port 1 View  Result Date: 03/30/2020 CLINICAL DATA:  Fever.  History of COPD. EXAM: PORTABLE CHEST 1 VIEW COMPARISON:  CT chest 03/26/2020. Single-view of the chest 03/25/2020. FINDINGS: Hazy multifocal airspace disease has worsened since the prior plain film. Heart size is normal. No pneumothorax or pleural effusion. Emphysema again noted. No acute or focal bony abnormality. IMPRESSION: Worsened multifocal pneumonia. Electronically Signed   By: Inge Rise M.D.   On: 03/30/2020 14:11   DG Chest Port 1 View  Result Date: 03/25/2020 CLINICAL DATA:  Possible sepsis.  Shortness of breath and confusion. EXAM: PORTABLE CHEST 1 VIEW COMPARISON:  PA and lateral chest and CT chest 03/16/2020. FINDINGS: Patchy bilateral airspace disease persists and appears slightly worse on the left. No pneumothorax or pleural fluid. Heart size is normal. No acute or focal bony abnormality. IMPRESSION: Patchy bilateral airspace disease consistent with pneumonia appears slightly worse on the left compared to the prior exam Electronically Signed   By: Inge Rise M.D.   On: 03/25/2020 14:58   VAS [place] LOWER EXTREMITY VENOUS (DVT)  Result Date: 03/31/2020  Lower Venous DVT Study Indications: Elevated Ddimer.  Risk Factors: COVID 19 positive. Comparison Study: No prior studies. Performing Technologist: Oliver Hum RVT  Examination Guidelines: A complete evaluation includes B-mode imaging, spectral Doppler, color Doppler, and power Doppler as needed of all accessible portions of each vessel. Bilateral testing is considered an integral part of a complete examination. Limited examinations for reoccurring indications Tanelle Lanzo be performed as noted. The reflux portion of the exam is performed with the patient in reverse Trendelenburg.  +---------+---------------+---------+-----------+----------+--------------+ RIGHT     CompressibilityPhasicitySpontaneityPropertiesThrombus Aging +---------+---------------+---------+-----------+----------+--------------+ CFV      Full  Yes      Yes                                 +---------+---------------+---------+-----------+----------+--------------+ SFJ      Full                                                        +---------+---------------+---------+-----------+----------+--------------+ FV Prox  Full                                                        +---------+---------------+---------+-----------+----------+--------------+ FV Mid   Full                                                        +---------+---------------+---------+-----------+----------+--------------+ FV DistalFull                                                        +---------+---------------+---------+-----------+----------+--------------+ PFV      Full                                                        +---------+---------------+---------+-----------+----------+--------------+ POP      Full           Yes      Yes                                 +---------+---------------+---------+-----------+----------+--------------+ PTV      Full                                                        +---------+---------------+---------+-----------+----------+--------------+ PERO     Full                                                        +---------+---------------+---------+-----------+----------+--------------+   +---------+---------------+---------+-----------+----------+--------------+ LEFT     CompressibilityPhasicitySpontaneityPropertiesThrombus Aging +---------+---------------+---------+-----------+----------+--------------+ CFV      Full           Yes      Yes                                 +---------+---------------+---------+-----------+----------+--------------+ SFJ  Full                                                         +---------+---------------+---------+-----------+----------+--------------+ FV Prox  Full                                                        +---------+---------------+---------+-----------+----------+--------------+ FV Mid   Full                                                        +---------+---------------+---------+-----------+----------+--------------+ FV DistalFull                                                        +---------+---------------+---------+-----------+----------+--------------+ PFV      Full                                                        +---------+---------------+---------+-----------+----------+--------------+ POP      Full           Yes      Yes                                 +---------+---------------+---------+-----------+----------+--------------+ PTV      Full                                                        +---------+---------------+---------+-----------+----------+--------------+ PERO     Full                                                        +---------+---------------+---------+-----------+----------+--------------+     Summary: RIGHT: - There is no evidence of deep vein thrombosis in the lower extremity.  - No cystic structure found in the popliteal fossa.  LEFT: - There is no evidence of deep vein thrombosis in the lower extremity.  - No cystic structure found in the popliteal fossa.  *See table(s) above for measurements and observations. Electronically signed by Jamelle Haring on 03/31/2020 at 8:10:57 PM.    Final      Procedures  MDM  ***

## 2020-04-03 NOTE — Progress Notes (Signed)
PROGRESS NOTE                                                                                                                                                                                                             Patient Demographics:    Tyrone Roman, is a 75 y.o. male, DOB - 1945/07/08, IPJ:[id number]  Outpatient Primary MD for the patient is [name], MD    LOS - 0  Admit date - 04/02/2020    Chief Complaint  Patient presents with  . Shortness of Breath       Brief Narrative (HPI from H&P)  -  Tyrone Roman is a 75 y.o. male with medical history significant for coronary artery disease, essential hypertension, hyperlipidemia, rheumatoid arthritis, BPH, COPD not on chronic oxygen, recently treated COVID-19 viral pneumonia superimposed by bacterial pneumonia who presented to [facility] ED from SNF due to shortness of breath, fever with T-max 102, tachycardia with heart rate in the 120s, altered mental status, and acute hypoxia with O2 saturation 86% on room air, of note he has had two treatments for COVID-19 pneumonia in the last couple of months. In the ER work-up was unremarkable with negative CT, inconclusive chest imaging, UA was clear and he was admitted to the hospital for further care for febrile illness and encephalopathy.   Subjective:    Tyrone Roman today has, No headache, No chest pain, No abdominal pain - No Nausea, No new weakness tingling or numbness, improved SOB.   Assessment  & Plan :     1. Febrile illness with hypoxic respiratory failure in a patient with two recent COVID-19 infection admissions - he has been treated with remdesivir recently, I do not think this present illness is due to active COVID-19 infection. Low-dose steroids will continue as CRP is high, will have speech therapy evaluate him and treat him with Unasyn for possible bacterial infection and aspiration. UA is clear. We'll continue to  look for other sources and monitor closely. No sepsis.  Encouraged the patient to sit up in chair in the daytime use I-S and flutter valve for pulmonary toiletry and then prone in bed when at night.  Will advance activity and titrate down oxygen as possible.   2. Deconditioning, possible early dementia. Patient has had two recent admissions to the hospital and was recently  discharged to SNF on the very day of this admission. Supportive care with PT OT. At risk for delirium, minimize narcotics and benzodiazepines.   3. Metabolic encephalopathy. Likely also has undiagnosed early dementia, head CT negative, no focal deficits, minimize benzodiazepines and narcotics, as needed Haldol if needed.  4. Chronic diastolic CHF recent EF 55 to 60%. Continue low-dose beta-blocker and monitor  5. BPH. On Flomax.  6. Dyslipidemia. On statin continue.  7. History of rheumatoid arthritis. Takes Rituxan every 4 months.          Condition -   Guarded  Family Communication  :  Daughter Cathe Mons [phone] on 04/03/20  Code Status :  Full  Consults  :  None  PUD Prophylaxis : PPI   Procedures  :     CT -    No acute intracranial abnormality.   Diffuse interstitial and ground-glass pulmonary infiltrate has overall improved since prior examination, though there is progressive volume loss and bibasilar increasing reticulation, possibly representing atelectasis or progressive inflammatory change in this region. No central obstructing mass.   No acute intra-abdominal pathology identified. Bilateral nonobstructing nephrolithiasis. Severe sigmoid diverticulosis. Postsurgical changes within the lumbar spine with acquired fusion of L1-2. Aortic Atherosclerosis (ICD10-I70.0).      Disposition Plan  :    Status is: Observation  Dispo:  Patient From: [facility]  Planned Disposition: [place]  Medically stable for discharge: No     DVT Prophylaxis  :  Lovenox     Lab Results  Component Value Date   PLT 205 04/03/2020    Diet :  Diet Order            DIET SOFT Room service appropriate? Yes; Fluid consistency: Thin  Diet effective now                  Inpatient Medications  Scheduled Meds: . benzonatate  100 mg Oral TID  . enoxaparin (LOVENOX) injection  40 mg Subcutaneous Daily  . methylPREDNISolone (SOLU-MEDROL) injection  40 mg Intravenous Q12H  . metoprolol succinate  25 mg Oral Daily  . mometasone-formoterol  2 puff Inhalation BID  . pantoprazole  40 mg Oral Daily  . simvastatin  40 mg Oral Daily  . tamsulosin  0.4 mg Oral Daily   Continuous Infusions: . sodium chloride 50 mL/hr at 04/03/20 0313   PRN Meds:.albuterol, nitroGLYCERIN  Antibiotics  :    Anti-infectives (From admission, onward)   Start     Dose/Rate Route Frequency Ordered Stop   04/03/20 0300  amoxicillin-clavulanate (AUGMENTIN) 875-125 MG per tablet 1 tablet  Status:  Discontinued        1 tablet Oral 2 times daily 04/03/20 0148 04/03/20 0704   04/02/20 2130  aztreonam (AZACTAM) 2 g in sodium chloride 0.9 % 100 mL IVPB  Status:  Discontinued        2 g 200 mL/hr over 30 Minutes Intravenous  Once 04/02/20 2120 04/02/20 2123   04/02/20 2130  metroNIDAZOLE (FLAGYL) IVPB 500 mg  Status:  Discontinued        500 mg 100 mL/hr over 60 Minutes Intravenous  Once 04/02/20 2120 04/02/20 2123   04/02/20 2130  vancomycin (VANCOREADY) IVPB 1000 mg/200 mL  Status:  Discontinued        1,000 mg 200 mL/hr over 60 Minutes Intravenous  Once 04/02/20 2120 04/02/20 2123   04/02/20 2130  vancomycin (VANCOREADY) IVPB 1750 mg/350 mL  1,750 mg 175 mL/hr over 120 Minutes Intravenous  Once 04/02/20 2123 04/03/20 0047   04/02/20 2130  piperacillin-tazobactam (ZOSYN) IVPB 3.375 g        3.375 g 100 mL/hr over 30 Minutes Intravenous  Once 04/02/20 2123 04/03/20 0008       Time Spent in minutes  30   Lala Lund M.D on 04/03/2020 at 10:14 AM  To page go to  [email or web address]   [facility] -  Office  [phone]   See all Orders from today for further details    Objective:   Vitals:   04/03/20 0200 04/03/20 0300 04/03/20 0400 04/03/20 0801  BP: 115/83 114/90 100/68 (!) 133/94  Pulse: 72 74 74   Resp:  20 19 16   Temp:  (!) 96.8 F (36 C) 97.6 F (36.4 C) 97.8 F (36.6 C)  TempSrc:  Axillary Axillary Oral  SpO2: 94% 94% 95% 92%  Weight:  94.4 kg    Height:  6\' 2"  (1.88 m)      Wt Readings from Last 3 Encounters:  04/03/20 94.4 kg  03/29/20 91 kg  02/29/20 98 kg     Intake/Output Summary (Last 24 hours) at 04/03/2020 1014 Last data filed at 04/03/2020 0944 Gross per 24 hour  Intake 120 ml  Output 400 ml  Net -280 ml     Physical Exam  Awake but confused, No new F.N deficits,   [place].AT,PERRAL Supple Neck,No JVD, No cervical lymphadenopathy appriciated.  Symmetrical Chest wall movement, Good air movement bilaterally, CTAB RRR,No Gallops,Rubs or new Murmurs, No Parasternal Heave +ve B.Sounds, Abd Soft, No tenderness, No organomegaly appriciated, No rebound - guarding or rigidity. No Cyanosis, Clubbing or edema, No new Rash or bruise       Data Review:    CBC Recent Labs  Lab 03/30/20 0446 03/31/20 0430 04/01/20 0421 04/02/20 2131 04/02/20 2145 04/03/20 0348  WBC 3.7* 2.2* 4.0 5.0  --  4.8  HGB 11.7* 10.2* 10.4* 12.0* 11.2* 10.7*  HCT 34.6* 31.8* 31.1* 36.9* 33.0* 31.1*  PLT 128* 129* 162 249  --  205  MCV 86.7 89.1 87.6 87.4  --  86.6  MCH 29.3 28.6 29.3 28.4  --  29.8  MCHC 33.8 32.1 33.4 32.5  --  34.4  RDW 14.8 14.9 14.8 15.0  --  14.9  LYMPHSABS 0.4* 0.2* 0.3* 0.2*  --  0.4*  MONOABS 0.1 0.1 0.2 0.2  --  0.2  EOSABS 0.1 0.0 0.0 0.0  --  0.0  BASOSABS 0.0 0.0 0.0 0.0  --  0.0    Recent Labs  Lab 03/28/20 0425 03/29/20 0439 03/30/20 0446 03/31/20 0430 04/01/20 0421 04/02/20 2131 04/02/20 2145 04/03/20 0021 04/03/20 0348 04/03/20 0727  NA 131* 133* 137 137 137 138 140  --  138  --   K  3.7 3.7 4.5 4.2 3.5 3.2* 3.2*  --  3.2*  --   CL 102 102 103 106 107 106  --   --  107  --   CO2 21* 22 24 21* 20* 22  --   --  22  --   GLUCOSE 106* 97 115* 204* 319* 208*  --   --  168*  --   BUN 14 17 20 23  32* 19  --   --  18  --   CREATININE 1.30* 1.32* 1.22 1.10 1.10 1.29*  --   --  1.09  --   CALCIUM 8.6* 9.0 9.0 8.8* 8.7* 9.0  --   --  8.6*  --   AST  --   --   --   --   --  116*  --   --  92*  --   ALT  --   --   --   --   --  74*  --   --  61*  --   ALKPHOS  --   --   --   --   --  97  --   --  77  --   BILITOT  --   --   --   --   --  0.8  --   --  0.9  --   ALBUMIN  --   --   --   --   --  2.0*  --   --  1.7*  --   MG 1.6* 1.7 1.8 1.9  --   --   --   --   --  1.8  CRP 18.5* 16.3* 17.4* 17.0*  --   --   --   --   --  7.1*  DDIMER 1.99* 2.05* 2.17* 1.60*  --   --   --   --   --  1.85*  PROCALCITON 0.11  --   --   --   --   --   --   --  <0.10  --   LATICACIDVEN  --   --   --   --   --  2.5*  --  1.8  --   --   INR  --   --   --   --   --  1.2  --   --   --   --   HGBA1C  --   --   --   --  7.7*  --   --   --   --   --   BNP  --   --   --   --   --   --   --   --   --  463.6*    ------------------------------------------------------------------------------------------------------------------ No results for input(s): CHOL, HDL, LDLCALC, TRIG, CHOLHDL, LDLDIRECT in the last 72 hours.  Lab Results  Component Value Date   HGBA1C 7.7 (H) 04/01/2020   ------------------------------------------------------------------------------------------------------------------ No results for input(s): TSH, T4TOTAL, T3FREE, THYROIDAB in the last 72 hours.  Invalid input(s): FREET3  Cardiac Enzymes No results for input(s): CKMB, TROPONINI, MYOGLOBIN in the last 168 hours.  Invalid input(s): CK ------------------------------------------------------------------------------------------------------------------    Component Value Date/Time   BNP 463.6 (H) 04/03/2020 0727    Micro  Results Recent Results (from the past 240 hour(s))  Urine culture     Status: Abnormal   Collection Time: 03/25/20  2:36 PM   Specimen: In/Out Cath Urine  Result Value Ref Range Status   Specimen Description   Final    IN/OUT CATH URINE Performed at [facility] [address] [place], [place] [address]    Special Requests   Final    NONE Performed at [facility], [place] [address], [place], [place] [address]    Culture MULTIPLE SPECIES PRESENT, SUGGEST RECOLLECTION (A)  Final   Report Status 03/27/2020 FINAL  Final  Blood Culture (routine x 2)     Status: None   Collection Time: 03/25/20  4:45 PM   Specimen: BLOOD LEFT HAND  Result Value Ref Range Status   Specimen Description   Final    BLOOD LEFT HAND Performed  at [facility], [place] [address] [place], [place] [address]    Special Requests   Final    BOTTLES DRAWN AEROBIC AND ANAEROBIC Blood Culture adequate volume Performed at [facility] [address], [place], [place] [address]    Culture   Final    NO GROWTH 5 DAYS Performed at [facility], [place] [address] [place], [place] [address]    Report Status 03/30/2020 FINAL  Final  Blood Culture (routine x 2)     Status: Abnormal   Collection Time: 03/25/20  4:45 PM   Specimen: BLOOD LEFT FOREARM  Result Value Ref Range Status   Specimen Description   Final    BLOOD LEFT FOREARM Performed at [facility] [address], [place], [place] [address]    Special Requests   Final    BOTTLES DRAWN AEROBIC AND ANAEROBIC Blood Culture adequate volume Performed at [facility] [address], [place], [place] [address]    Culture  Setup Time   Final    GRAM POSITIVE COCCI IN CLUSTERS AEROBIC BOTTLE ONLY CRITICAL RESULT CALLED TO, READ BACK BY AND VERIFIED WITH: A PHAM PHARMD @1829  03/26/20 EB    Culture (A)  Final    STAPHYLOCOCCUS HOMINIS THE  SIGNIFICANCE OF ISOLATING THIS ORGANISM FROM A SINGLE SET OF BLOOD CULTURES WHEN MULTIPLE SETS ARE DRAWN IS UNCERTAIN. PLEASE NOTIFY THE MICROBIOLOGY DEPARTMENT WITHIN ONE WEEK IF SPECIATION AND SENSITIVITIES ARE REQUIRED. Performed at [facility], [place] [address] [place],  [address]    Report Status 03/28/2020 FINAL  Final  Blood Culture ID Panel (Reflexed)     Status: Abnormal   Collection Time: 03/25/20  4:45 PM  Result Value Ref Range Status   Enterococcus faecalis NOT DETECTED NOT DETECTED Final   Enterococcus Faecium NOT DETECTED NOT DETECTED Final   Listeria monocytogenes NOT DETECTED NOT DETECTED Final   Staphylococcus species DETECTED (A) NOT DETECTED Final    Comment: CRITICAL RESULT CALLED TO, READ BACK BY AND VERIFIED WITH: A PHAM PHARMD @1829  03/26/20 EB    Staphylococcus aureus (BCID) NOT DETECTED NOT DETECTED Final   Staphylococcus epidermidis DETECTED (A) NOT DETECTED Final    Comment: Methicillin (oxacillin) resistant coagulase negative staphylococcus. Possible blood culture contaminant (unless isolated from more than one blood culture draw or clinical case suggests pathogenicity). No antibiotic treatment is indicated for blood  culture contaminants. CRITICAL RESULT CALLED TO, READ BACK BY AND VERIFIED WITH: A PHAM PHARMD @1829  03/26/20 EB    Staphylococcus lugdunensis NOT DETECTED NOT DETECTED Final   Streptococcus species NOT DETECTED NOT DETECTED Final   Streptococcus agalactiae NOT DETECTED NOT DETECTED Final   Streptococcus pneumoniae NOT DETECTED NOT DETECTED Final   Streptococcus pyogenes NOT DETECTED NOT DETECTED Final   A.calcoaceticus-baumannii NOT DETECTED NOT DETECTED Final   Bacteroides fragilis NOT DETECTED NOT DETECTED Final   Enterobacterales NOT DETECTED NOT DETECTED Final   Enterobacter cloacae complex NOT DETECTED NOT DETECTED Final   Escherichia coli NOT DETECTED NOT DETECTED Final   Klebsiella aerogenes NOT DETECTED NOT DETECTED Final    Klebsiella oxytoca NOT DETECTED NOT DETECTED Final   Klebsiella pneumoniae NOT DETECTED NOT DETECTED Final   Proteus species NOT DETECTED NOT DETECTED Final   Salmonella species NOT DETECTED NOT DETECTED Final   Serratia marcescens NOT DETECTED NOT DETECTED Final   Haemophilus influenzae NOT DETECTED NOT DETECTED Final   Neisseria meningitidis NOT DETECTED NOT DETECTED Final   Pseudomonas  aeruginosa NOT DETECTED NOT DETECTED Final   Stenotrophomonas maltophilia NOT DETECTED NOT DETECTED Final   Candida albicans NOT DETECTED NOT DETECTED Final   Candida auris NOT DETECTED NOT DETECTED Final   Candida glabrata NOT DETECTED NOT DETECTED Final   Candida krusei NOT DETECTED NOT DETECTED Final   Candida parapsilosis NOT DETECTED NOT DETECTED Final   Candida tropicalis NOT DETECTED NOT DETECTED Final   Cryptococcus neoformans/gattii NOT DETECTED NOT DETECTED Final   Methicillin resistance mecA/C DETECTED (A) NOT DETECTED Final    Comment: CRITICAL RESULT CALLED TO, READ BACK BY AND VERIFIED WITH: A PHAM PHARMD @1829  03/26/20 EB Performed at [facility], [address] [place], [place] [address]   Resp Panel by RT-PCR (Flu A&B, Covid) Nasopharyngeal Swab     Status: Abnormal   Collection Time: 03/25/20  5:47 PM   Specimen: Nasopharyngeal Swab; Nasopharyngeal(NP) swabs in vial transport medium  Result Value Ref Range Status   SARS Coronavirus 2 by RT PCR POSITIVE (A) NEGATIVE Final    Comment: RESULT CALLED TO, READ BACK BY AND VERIFIED WITH: C SIMPSON AT 1852 ON 03/25/2020 BY JPM (NOTE) SARS-CoV-2 target nucleic acids are DETECTED.  The SARS-CoV-2 RNA is generally detectable in upper respiratory specimens during the acute phase of infection. Positive results are indicative of the presence of the identified virus, but do not rule out bacterial infection or co-infection with other pathogens not detected by the test. Clinical correlation with patient history and other diagnostic  information is necessary to determine patient infection status. The expected result is Negative.  Fact Sheet for Patients: EntrepreneurPulse.com.au  Fact Sheet for Healthcare Providers: IncredibleEmployment.be  This test is not yet approved or cleared by the [place] FDA and  has been authorized for detection and/or diagnosis of SARS-CoV-2 by FDA under an Emergency Use Authorization (EUA).  This EUA will remain in effect (meaning this test ca n be used) for the duration of  the COVID-19 declaration under Section 564(b)(1) of the Act, 21 U.S.C. section 360bbb-3(b)(1), unless the authorization is terminated or revoked sooner.     Influenza A by PCR NEGATIVE NEGATIVE Final   Influenza B by PCR NEGATIVE NEGATIVE Final    Comment: (NOTE) The Xpert Xpress SARS-CoV-2/FLU/RSV plus assay is intended as an aid in the diagnosis of influenza from Nasopharyngeal swab specimens and should not be used as a sole basis for treatment. Nasal washings and aspirates are unacceptable for Xpert Xpress SARS-CoV-2/FLU/RSV testing.  Fact Sheet for Patients: EntrepreneurPulse.com.au  Fact Sheet for Healthcare Providers: IncredibleEmployment.be  This test is not yet approved or cleared by the [place] FDA and has been authorized for detection and/or diagnosis of SARS-CoV-2 by FDA under an Emergency Use Authorization (EUA). This EUA will remain in effect (meaning this test can be used) for the duration of the COVID-19 declaration under Section 564(b)(1) of the Act, 21 U.S.C. section 360bbb-3(b)(1), unless the authorization is terminated or revoked.  Performed at [facility], [place] [address] [place], [place] [address]   Culture, sputum-assessment     Status: None   Collection Time: 03/25/20  8:38 PM   Specimen: Expectorated Sputum  Result Value Ref Range Status   Specimen Description EXPECTORATED  SPUTUM  Final   Special Requests NONE  Final   Sputum evaluation   Final    THIS SPECIMEN IS ACCEPTABLE FOR SPUTUM CULTURE Performed at [facility], [place] [address] [place],  [address]    Report Status 03/25/2020 FINAL  Final  MRSA PCR Screening     Status: None   Collection Time: 03/25/20  8:38 PM   Specimen: Nasal Mucosa; Nasopharyngeal  Result Value Ref Range Status   MRSA by PCR NEGATIVE NEGATIVE Final    Comment:        The GeneXpert MRSA Assay (FDA approved for NASAL specimens only), is one component of a comprehensive MRSA colonization surveillance program. It is not intended to diagnose MRSA infection nor to guide or monitor treatment for MRSA infections. Performed at [facility], [place] [address] [place], [place] [address]   Culture, Respiratory w Gram Stain     Status: None   Collection Time: 03/25/20  8:38 PM  Result Value Ref Range Status   Specimen Description   Final    EXPECTORATED SPUTUM Performed at [facility] [address], [place], [place] [address]    Special Requests   Final    NONE Reflexed from [id number] Performed at [facility] [address], [place], [place] [address]    Gram Stain   Final    NO WBC SEEN FEW SQUAMOUS EPITHELIAL CELLS PRESENT ABUNDANT GRAM POSITIVE COCCI ABUNDANT GRAM NEGATIVE RODS MODERATE GRAM POSITIVE RODS    Culture   Final    ABUNDANT Consistent with normal respiratory flora. No Pseudomonas species isolated Performed at [facility] [address] [place], [place] [address]    Report Status 03/28/2020 FINAL  Final  Culture, blood (routine x 2)     Status: None   Collection Time: 03/27/20  9:23 AM   Specimen: BLOOD RIGHT HAND  Result Value Ref Range Status   Specimen Description   Final    BLOOD RIGHT HAND Performed at [facility], [place] [address] [place], [place] [address]    Special Requests   Final    BOTTLES  DRAWN AEROBIC ONLY Blood Culture results may not be optimal due to an inadequate volume of blood received in culture bottles Performed at [facility] [address] [place], [place] [address]    Culture   Final    NO GROWTH 5 DAYS Performed at [facility], [place] [address] [place], [place] [address]    Report Status 04/01/2020 FINAL  Final  Culture, blood (routine x 2)     Status: None   Collection Time: 03/27/20  9:23 AM   Specimen: BLOOD LEFT HAND  Result Value Ref Range Status   Specimen Description   Final    BLOOD LEFT HAND Performed at [facility], [place] [address] [place], [place] [address]    Special Requests   Final    BOTTLES DRAWN AEROBIC AND ANAEROBIC Blood Culture results may not be optimal due to an inadequate volume of blood received in culture bottles Performed at [facility] [address] [place], [place] [address]    Culture   Final    NO GROWTH 5 DAYS Performed at [facility], [place] [address], [place],  [address]    Report Status 04/01/2020 FINAL  Final  Culture, blood (routine x 2)     Status: None (Preliminary result)   Collection Time: 03/30/20  1:29 PM   Specimen: BLOOD RIGHT HAND  Result Value Ref Range Status   Specimen Description   Final    BLOOD RIGHT HAND Performed at [facility] [address] [place],  [address]    Special Requests  Final    BOTTLES DRAWN AEROBIC ONLY Blood Culture results may not be optimal due to an inadequate volume of blood received in culture bottles Performed at [facility], [place] [address], [place], [place] [address]    Culture   Final    NO GROWTH 3 DAYS Performed at [facility], [place] [address] [place], [place] [address]    Report Status PENDING  Incomplete  Culture, blood (routine x 2)     Status: None (Preliminary result)   Collection Time: 03/30/20  1:29 PM   Specimen: BLOOD RIGHT HAND   Result Value Ref Range Status   Specimen Description   Final    BLOOD RIGHT HAND Performed at [facility] [address], [place], [place] [address]    Special Requests   Final    BOTTLES DRAWN AEROBIC AND ANAEROBIC Blood Culture adequate volume Performed at [facility] [address], [place], [place] [address]    Culture   Final    NO GROWTH 3 DAYS Performed at [facility], [place] [address] [place], [place] [address]    Report Status PENDING  Incomplete  Culture, Urine     Status: None   Collection Time: 03/30/20  5:04 PM   Specimen: Urine, Random  Result Value Ref Range Status   Specimen Description   Final    URINE, RANDOM Performed at [facility] [address] [place], [place] [address]    Special Requests   Final    NONE Performed at [facility], [place] [address] [place], [place] [address]    Culture   Final    NO GROWTH Performed at [facility], [place] [address], [place], [place] [address]    Report Status 04/01/2020 FINAL  Final    Radiology Reports DG Chest 2 View  Result Date: 03/16/2020 CLINICAL DATA:  Chest pain, COVID-19 positive. EXAM: CHEST - 2 VIEW COMPARISON:  February 29, 2020. FINDINGS: The heart size and mediastinal contours are within normal limits. No pneumothorax or pleural effusion is noted. Multiple patchy airspace opacities are noted bilaterally consistent with multifocal pneumonia. The visualized skeletal structures are unremarkable. IMPRESSION: Bilateral multifocal pneumonia. Electronically Signed   By: Marijo Conception M.D.   On: 03/16/2020 13:58   CT Head Wo Contrast  Result Date: 04/02/2020 CLINICAL DATA:  Altered mental status, sepsis, hypoxia, fever EXAM: CT HEAD WITHOUT CONTRAST CT CHEST, ABDOMEN AND PELVIS WITH CONTRAST TECHNIQUE: Contiguous axial images were obtained from the base of the skull through the vertex without intravenous contrast.  Multiplanar CT image reconstructions were also generated. Multidetector CT imaging of the chest, abdomen and pelvis was performed following the standard protocol during bolus administration of intravenous contrast. CONTRAST:  110mL OMNIPAQUE IOHEXOL 300 MG/ML  SOLN COMPARISON:  CT head 03/30/2020, CT chest 03/26/2020, CT abdomen pelvis 10/07/2017 FINDINGS: CT HEAD FINDINGS Brain: Normal anatomic configuration of the brain. No evidence of acute intracranial hemorrhage or infarct. Mild parenchymal volume loss is commensurate with the patient's age. Mild periventricular white matter changes are present likely reflecting the sequela of small vessel ischemia. No abnormal intra or extra-axial mass lesion. Left posterior fossa parasagittal arachnoid cyst is unchanged. Ventricular size is normal. Cerebellum is unremarkable. Vascular: No asymmetric hyperdense vasculature at the skull base. Skull: Intact Sinuses/Orbits: The paranasal sinuses are clear. The orbits are unremarkable. Other: Mastoid air cells and middle ear cavities are clear. CT CHEST FINDINGS Cardiovascular: Extensive  coronary artery calcification predominantly within the a left anterior descending coronary artery. Global cardiac size within normal limits. Mild left ventricular hypertrophy noted, however. No pericardial effusion. The central pulmonary arteries are of normal caliber. Minimal atherosclerotic calcification is seen within the thoracic aorta. No aortic aneurysm. Mediastinum/Nodes: No pathologic thoracic adenopathy. Esophagus unremarkable. Thyroid unremarkable peer Lungs/Pleura: Diffuse bilateral interstitial and ground-glass pulmonary infiltrate is again noted and has largely improved in the interval since prior examination. Evaluation of the pulmonary parenchyma is slightly limited by motion artifact. There is, however, progressive volume loss identified with increasing reticulation at the lung bases possibly representing progressive atelectasis or  developing inflammatory change. No pneumothorax or pleural effusion. Central airways are patent. Musculoskeletal: No acute bone abnormality. No lytic or blastic bone lesion. CT ABDOMEN PELVIS FINDINGS Hepatobiliary: No focal liver abnormality is seen. No gallstones, gallbladder wall thickening, or biliary dilatation. Pancreas: Unremarkable Spleen: Unremarkable Adrenals/Urinary Tract: The adrenal glands are unremarkable. The kidneys are normal in size and position. Multiple punctate nonobstructing calculi are noted within the kidneys bilaterally. Several larger calculi measuring up to 10 mm are noted within the lower pole of the left kidney adjacent to a caliceal diverticulum. Simple cortical cyst noted within the interpolar region of the right kidney. The kidneys are otherwise unremarkable. No hydronephrosis. No ureteral calculi. The bladder is unremarkable. Stomach/Bowel: Severe sigmoid diverticulosis. The stomach, small bowel, and large bowel are otherwise unremarkable. Appendix normal. No free intraperitoneal gas or fluid. Vascular/Lymphatic: Mild aortoiliac atherosclerotic calcification. No aortic aneurysm. No pathologic adenopathy within the abdomen and pelvis. Reproductive: Prostate is unremarkable. Other: No abdominal wall hernia.  The rectum is unremarkable. Musculoskeletal: No acute bone abnormality. There has developed acquired fusion of the a L1 and L2 vertebral bodies with a mild resultant focal kyphotic deformity at this level. No superimposed osseous erosion noted. Right L2 hemilaminectomy has been performed. No lytic or blastic bone lesions. IMPRESSION: No acute intracranial abnormality. Diffuse interstitial and ground-glass pulmonary infiltrate has overall improved since prior examination, though there is progressive volume loss and bibasilar increasing reticulation, possibly representing atelectasis or progressive inflammatory change in this region. No central obstructing mass. No acute  intra-abdominal pathology identified. Bilateral nonobstructing nephrolithiasis. Severe sigmoid diverticulosis. Postsurgical changes within the lumbar spine with acquired fusion of L1-2. Aortic Atherosclerosis (ICD10-I70.0). Electronically Signed   By: Fidela Salisbury MD   On: 04/02/2020 23:48   CT HEAD WO CONTRAST  Result Date: 03/30/2020 CLINICAL DATA:  Mental status changes of unknown etiology. Coronavirus pneumonia. EXAM: CT HEAD WITHOUT CONTRAST TECHNIQUE: Contiguous axial images were obtained from the base of the skull through the vertex without intravenous contrast. COMPARISON:  MRI 12/13/2017 FINDINGS: Brain: No change since prior appearances. Mild age related volume loss. No sign of acute infarction, mass lesion, hemorrhage, hydrocephalus or extra-axial collection. Incidental mega cisterna magna. Vascular: There is atherosclerotic calcification of the major vessels at the base of the brain. Skull: Negative Sinuses/Orbits: Clear/normal Other: None IMPRESSION: No acute or reversible finding. Mild age related volume loss. Atherosclerotic calcification of the major vessels at the base of the brain. Electronically Signed   By: Nelson Chimes M.D.   On: 03/30/2020 19:15   CT ANGIO CHEST PE W OR WO CONTRAST  Result Date: 03/26/2020 CLINICAL DATA:  Shortness of breath.  COVID positive. EXAM: CT ANGIOGRAPHY CHEST WITH CONTRAST TECHNIQUE: Multidetector CT imaging of the chest was performed using the standard protocol during bolus administration of intravenous contrast. Multiplanar CT image reconstructions and MIPs were obtained to evaluate the  vascular anatomy. CONTRAST:  159mL OMNIPAQUE IOHEXOL 350 MG/ML SOLN COMPARISON:  03/16/2020 FINDINGS: Cardiovascular: Examination is limited by respiratory motion artifact. Within that limitation, there is no central pulmonary embolus. The size of the main pulmonary artery is normal. Normal heart size with coronary artery calcification. The course and caliber of the aorta  are normal. There is mild atherosclerotic calcification. Opacification decreased due to pulmonary arterial phase contrast bolus timing. Mediastinum/Nodes: -- No mediastinal lymphadenopathy. -- No hilar lymphadenopathy. -- No axillary lymphadenopathy. -- No supraclavicular lymphadenopathy. -- Normal thyroid gland where visualized. -  Unremarkable esophagus. Lungs/Pleura: There are trace bilateral pleural effusions. There are diffuse bilateral ground-glass airspace opacities. Emphysematous changes are suspected at the lung apices. There are pleural based calcified and noncalcified plaques. Overall, the airspace disease has worsened since the prior study. There is no pneumothorax. The trachea is unremarkable. There are trace bilateral pleural effusions. Upper Abdomen: Contrast bolus timing is not optimized for evaluation of the abdominal organs. The visualized portions of the organs of the upper abdomen are normal. Musculoskeletal: No chest wall abnormality. No bony spinal canal stenosis. Review of the MIP images confirms the above findings. IMPRESSION: 1. Examination is limited by respiratory motion artifact. Within that limitation, there is no central pulmonary embolus. 2. Worsening bilateral ground-glass airspace opacities, consistent with the patient's history of viral pneumonia. 3. Trace bilateral pleural effusions. 4. Calcified and noncalcified pleural plaques, consistent with prior asbestos exposure. Aortic Atherosclerosis (ICD10-I70.0) and Emphysema (ICD10-J43.9). Electronically Signed   By: Constance Holster M.D.   On: 03/26/2020 17:23   CT Angio Chest PE W and/or Wo Contrast  Result Date: 03/16/2020 CLINICAL DATA:  COVID, chest pain.  Concern for PE. EXAM: CT ANGIOGRAPHY CHEST WITH CONTRAST TECHNIQUE: Multidetector CT imaging of the chest was performed using the standard protocol during bolus administration of intravenous contrast. Multiplanar CT image reconstructions and MIPs were obtained to evaluate  the vascular anatomy. CONTRAST:  16mL OMNIPAQUE IOHEXOL 300 MG/ML  SOLN COMPARISON:  11/27/2010 FINDINGS: Cardiovascular: No filling defects in the pulmonary arteries to suggest pulmonary emboli. Heart is normal size. Aorta is normal caliber. Coronary artery and aortic calcifications. Mediastinum/Nodes: No mediastinal, hilar, or axillary adenopathy. Trachea and esophagus are unremarkable. Thyroid unremarkable. Lungs/Pleura: Patchy peripheral bilateral airspace opacities, favor related to COVID pneumonia. Mild centrilobular emphysema. No effusions. Upper Abdomen: Insert for abdomen Musculoskeletal: Chest wall soft tissues are unremarkable. No acute bony abnormality. Review of the MIP images confirms the above findings. IMPRESSION: No evidence of pulmonary embolus. Patchy bilateral peripheral airspace opacities most compatible with COVID pneumonia. Coronary artery disease. Aortic Atherosclerosis (ICD10-I70.0) and Emphysema (ICD10-J43.9). Electronically Signed   By: Rolm Baptise M.D.   On: 03/16/2020 20:49   CT CHEST ABDOMEN PELVIS W CONTRAST  Result Date: 04/02/2020 CLINICAL DATA:  Altered mental status, sepsis, hypoxia, fever EXAM: CT HEAD WITHOUT CONTRAST CT CHEST, ABDOMEN AND PELVIS WITH CONTRAST TECHNIQUE: Contiguous axial images were obtained from the base of the skull through the vertex without intravenous contrast. Multiplanar CT image reconstructions were also generated. Multidetector CT imaging of the chest, abdomen and pelvis was performed following the standard protocol during bolus administration of intravenous contrast. CONTRAST:  161mL OMNIPAQUE IOHEXOL 300 MG/ML  SOLN COMPARISON:  CT head 03/30/2020, CT chest 03/26/2020, CT abdomen pelvis 10/07/2017 FINDINGS: CT HEAD FINDINGS Brain: Normal anatomic configuration of the brain. No evidence of acute intracranial hemorrhage or infarct. Mild parenchymal volume loss is commensurate with the patient's age. Mild periventricular white matter changes are  present likely reflecting  the sequela of small vessel ischemia. No abnormal intra or extra-axial mass lesion. Left posterior fossa parasagittal arachnoid cyst is unchanged. Ventricular size is normal. Cerebellum is unremarkable. Vascular: No asymmetric hyperdense vasculature at the skull base. Skull: Intact Sinuses/Orbits: The paranasal sinuses are clear. The orbits are unremarkable. Other: Mastoid air cells and middle ear cavities are clear. CT CHEST FINDINGS Cardiovascular: Extensive coronary artery calcification predominantly within the a left anterior descending coronary artery. Global cardiac size within normal limits. Mild left ventricular hypertrophy noted, however. No pericardial effusion. The central pulmonary arteries are of normal caliber. Minimal atherosclerotic calcification is seen within the thoracic aorta. No aortic aneurysm. Mediastinum/Nodes: No pathologic thoracic adenopathy. Esophagus unremarkable. Thyroid unremarkable peer Lungs/Pleura: Diffuse bilateral interstitial and ground-glass pulmonary infiltrate is again noted and has largely improved in the interval since prior examination. Evaluation of the pulmonary parenchyma is slightly limited by motion artifact. There is, however, progressive volume loss identified with increasing reticulation at the lung bases possibly representing progressive atelectasis or developing inflammatory change. No pneumothorax or pleural effusion. Central airways are patent. Musculoskeletal: No acute bone abnormality. No lytic or blastic bone lesion. CT ABDOMEN PELVIS FINDINGS Hepatobiliary: No focal liver abnormality is seen. No gallstones, gallbladder wall thickening, or biliary dilatation. Pancreas: Unremarkable Spleen: Unremarkable Adrenals/Urinary Tract: The adrenal glands are unremarkable. The kidneys are normal in size and position. Multiple punctate nonobstructing calculi are noted within the kidneys bilaterally. Several larger calculi measuring up to 10 mm  are noted within the lower pole of the left kidney adjacent to a caliceal diverticulum. Simple cortical cyst noted within the interpolar region of the right kidney. The kidneys are otherwise unremarkable. No hydronephrosis. No ureteral calculi. The bladder is unremarkable. Stomach/Bowel: Severe sigmoid diverticulosis. The stomach, small bowel, and large bowel are otherwise unremarkable. Appendix normal. No free intraperitoneal gas or fluid. Vascular/Lymphatic: Mild aortoiliac atherosclerotic calcification. No aortic aneurysm. No pathologic adenopathy within the abdomen and pelvis. Reproductive: Prostate is unremarkable. Other: No abdominal wall hernia.  The rectum is unremarkable. Musculoskeletal: No acute bone abnormality. There has developed acquired fusion of the a L1 and L2 vertebral bodies with a mild resultant focal kyphotic deformity at this level. No superimposed osseous erosion noted. Right L2 hemilaminectomy has been performed. No lytic or blastic bone lesions. IMPRESSION: No acute intracranial abnormality. Diffuse interstitial and ground-glass pulmonary infiltrate has overall improved since prior examination, though there is progressive volume loss and bibasilar increasing reticulation, possibly representing atelectasis or progressive inflammatory change in this region. No central obstructing mass. No acute intra-abdominal pathology identified. Bilateral nonobstructing nephrolithiasis. Severe sigmoid diverticulosis. Postsurgical changes within the lumbar spine with acquired fusion of L1-2. Aortic Atherosclerosis (ICD10-I70.0). Electronically Signed   By: Fidela Salisbury MD   On: 04/02/2020 23:48   DG Chest Port 1 View  Result Date: 04/03/2020 CLINICAL DATA:  75 year old male with history of COVID infection complicated by superimposed bacterial pneumonia. EXAM: PORTABLE CHEST 1 VIEW COMPARISON:  Chest x-ray 04/03/2020. FINDINGS: Lung volumes are slightly low. Persistent ill-defined opacities and  widespread areas of interstitial prominence noted in the lungs bilaterally (right greater than left), most confluent in the right mid to lower lung. No pleural effusions. No pneumothorax. No evidence of pulmonary edema. Heart size appears borderline enlarged. The patient is rotated to the right on today's exam, resulting in distortion of the mediastinal contours and reduced diagnostic sensitivity and specificity for mediastinal pathology. IMPRESSION: 1. Multilobar bilateral pneumonia appears similar to recent prior studies, as above. Electronically Signed   By: Quillian Quince  Entrikin M.D.   On: 04/03/2020 07:53   DG Chest Port 1 View  Result Date: 04/03/2020 CLINICAL DATA:  COVID positive EXAM: PORTABLE CHEST 1 VIEW COMPARISON:  CT chest April 02, 2020 FINDINGS: The heart size and mediastinal contours are mildly enlarged. Hazy ground-glass interstitial airspace opacities bilaterally are again seen throughout both lungs. There is prominence of the central pulmonary vasculature. No acute osseous abnormality. IMPRESSION: Hazy ground-glass airspace opacities seen throughout both lungs, unchanged is seen prior CT of April 02, 2020, likely consistent with multifocal pneumonia Electronically Signed   By: Prudencio Pair M.D.   On: 04/03/2020 00:45   DG Chest Port 1 View  Result Date: 03/30/2020 CLINICAL DATA:  Fever.  History of COPD. EXAM: PORTABLE CHEST 1 VIEW COMPARISON:  CT chest 03/26/2020. Single-view of the chest 03/25/2020. FINDINGS: Hazy multifocal airspace disease has worsened since the prior plain film. Heart size is normal. No pneumothorax or pleural effusion. Emphysema again noted. No acute or focal bony abnormality. IMPRESSION: Worsened multifocal pneumonia. Electronically Signed   By: Inge Rise M.D.   On: 03/30/2020 14:11   DG Chest Port 1 View  Result Date: 03/25/2020 CLINICAL DATA:  Possible sepsis.  Shortness of breath and confusion. EXAM: PORTABLE CHEST 1 VIEW COMPARISON:  PA and lateral  chest and CT chest 03/16/2020. FINDINGS: Patchy bilateral airspace disease persists and appears slightly worse on the left. No pneumothorax or pleural fluid. Heart size is normal. No acute or focal bony abnormality. IMPRESSION: Patchy bilateral airspace disease consistent with pneumonia appears slightly worse on the left compared to the prior exam Electronically Signed   By: Inge Rise M.D.   On: 03/25/2020 14:58   VAS [place] LOWER EXTREMITY VENOUS (DVT)  Result Date: 03/31/2020  Lower Venous DVT Study Indications: Elevated Ddimer.  Risk Factors: COVID 19 positive. Comparison Study: No prior studies. Performing Technologist: Oliver Hum RVT  Examination Guidelines: A complete evaluation includes B-mode imaging, spectral Doppler, color Doppler, and power Doppler as needed of all accessible portions of each vessel. Bilateral testing is considered an integral part of a complete examination. Limited examinations for reoccurring indications may be performed as noted. The reflux portion of the exam is performed with the patient in reverse Trendelenburg.  +---------+---------------+---------+-----------+----------+--------------+ RIGHT    CompressibilityPhasicitySpontaneityPropertiesThrombus Aging +---------+---------------+---------+-----------+----------+--------------+ CFV      Full           Yes      Yes                                 +---------+---------------+---------+-----------+----------+--------------+ SFJ      Full                                                        +---------+---------------+---------+-----------+----------+--------------+ FV Prox  Full                                                        +---------+---------------+---------+-----------+----------+--------------+ FV Mid   Full                                                        +---------+---------------+---------+-----------+----------+--------------+  FV DistalFull                                                         +---------+---------------+---------+-----------+----------+--------------+ PFV      Full                                                        +---------+---------------+---------+-----------+----------+--------------+ POP      Full           Yes      Yes                                 +---------+---------------+---------+-----------+----------+--------------+ PTV      Full                                                        +---------+---------------+---------+-----------+----------+--------------+ PERO     Full                                                        +---------+---------------+---------+-----------+----------+--------------+   +---------+---------------+---------+-----------+----------+--------------+ LEFT     CompressibilityPhasicitySpontaneityPropertiesThrombus Aging +---------+---------------+---------+-----------+----------+--------------+ CFV      Full           Yes      Yes                                 +---------+---------------+---------+-----------+----------+--------------+ SFJ      Full                                                        +---------+---------------+---------+-----------+----------+--------------+ FV Prox  Full                                                        +---------+---------------+---------+-----------+----------+--------------+ FV Mid   Full                                                        +---------+---------------+---------+-----------+----------+--------------+ FV DistalFull                                                        +---------+---------------+---------+-----------+----------+--------------+  PFV      Full                                                        +---------+---------------+---------+-----------+----------+--------------+ POP      Full           Yes      Yes                                  +---------+---------------+---------+-----------+----------+--------------+ PTV      Full                                                        +---------+---------------+---------+-----------+----------+--------------+ PERO     Full                                                        +---------+---------------+---------+-----------+----------+--------------+     Summary: RIGHT: - There is no evidence of deep vein thrombosis in the lower extremity.  - No cystic structure found in the popliteal fossa.  LEFT: - There is no evidence of deep vein thrombosis in the lower extremity.  - No cystic structure found in the popliteal fossa.  *See table(s) above for measurements and observations. Electronically signed by Jamelle Haring on 03/31/2020 at 8:10:57 PM.    Final

## 2020-04-03 NOTE — Progress Notes (Signed)
[phone] pt daughter would like a update

## 2020-04-03 NOTE — TOC Initial Note (Addendum)
Transition of Care [facility]) - Initial/Assessment Note    Patient Details  Name: Tyrone Roman MRN: [id number] Date of Birth: 1945/10/08  Transition of Care [facility]) CM/SW Contact:    Carles Collet, RN Phone Number: 04/03/2020, 8:30 AM  Clinical Narrative:                 [name] w patient over the phone. He states that he lives at home, verified demographics, and plans to return at DC. However chart reviels that he came from [facility].  Spoke with his daughter [name]. She states that he is from [place] for short term rehab, was there just a day then came back to the hospital. She would like for him to go back to [place] at discharge. TOC will continue to follow.     Expected Discharge Plan: Home/Self Care Barriers to Discharge: Continued Medical Work up   Patient Goals and CMS Choice Patient states their goals for this hospitalization and ongoing recovery are:: to go home      Expected Discharge Plan and Services Expected Discharge Plan: Home/Self Care   Discharge Planning Services: CM Consult   Living arrangements for the past 2 months: Single Family Home                                      Prior Living Arrangements/Services Living arrangements for the past 2 months: Single Family Home                Current home services: DME (oxygen through [employer])    Activities of Daily Living      Permission Sought/Granted                  Emotional Assessment              Admission diagnosis:  Trauma [T14.90XA] Generalized abdominal pain [R10.84] Hypoxia [R09.02] Fever, unspecified fever cause [R50.9] Altered mental status, unspecified altered mental status type [R41.82] Acute hypoxemic respiratory failure due to COVID-19 ([place]) [U07.1, J96.01] Patient Active Problem List   Diagnosis Date Noted  . Acute hypoxemic respiratory failure due to COVID-19 ([place]) 04/03/2020  . Acute respiratory failure with hypoxia ([place]) 03/28/2020  . Physical deconditioning  03/26/2020  . Normocytic anemia 03/26/2020  . Severe sepsis ([place]) 03/25/2020  . Multifocal pneumonia 03/17/2020  . COPD with acute exacerbation ([place]) 03/17/2020  . Hypoxia 03/17/2020  . CAD (coronary artery disease) 03/17/2020  . Thrombocytopenia ([place]) 03/08/2020  . Stage 3b chronic kidney disease ([place]) 03/08/2020  . Pneumonia due to COVID-19 virus 02/29/2020  . Acute respiratory failure due to COVID-19 ([place]) 02/29/2020  . Closed nondisplaced fracture of shaft of third metacarpal bone of left hand 03/04/2019  . Wound infection after surgery 01/01/2018  . Medication monitoring encounter 01/01/2018  . HNP (herniated nucleus pulposus), lumbar 10/07/2017  . Lumbar herniated disc 10/07/2017  . Psoriasis of scalp 03/27/2016  . Rheumatoid arthritis ([place]) 03/25/2013  . GI bleeding 02/20/2012  . Dyspnea on exertion 09/08/2010  . COPD (chronic obstructive pulmonary disease) ([place]) 08/04/2010  . Bruising 08/04/2010  . TINNITUS 12/16/2009  . Dizziness and giddiness 12/16/2009  . NECK SPRAIN AND STRAIN 10/21/2009  . LUMBAR SPRAIN AND STRAIN 10/21/2009  . Hyperlipidemia 06/10/2009  . Coronary artery disease of native artery of native heart with stable angina pectoris ([place]) 06/10/2009  . GERD 06/10/2009  . BARRETTS ESOPHAGUS 06/10/2009  . CONCUSSION WITH LOC OF 30  MINUTES OR LESS 02/28/2009  . NEPHROLITHIASIS 07/01/2007  . CONTACT DERMATITIS 12/12/2006  . BPH (benign prostatic hyperplasia) 11/04/2006  . COLONIC POLYPS 10/21/2003  . DIVERTICULOSIS, COLON 10/21/2003   PCP:  [name], MD Pharmacy:   [facility], [place] [address] Phone: [phone] Fax: [phone]     Social Determinants of Health (SDOH) Interventions    Readmission Risk Interventions No flowsheet data found.

## 2020-04-03 NOTE — Plan of Care (Signed)
Problem: Education: Goal: Knowledge of risk factors and measures for prevention of condition will improve 04/03/2020 2054 by Neta Mends, RN Outcome: Progressing 04/03/2020 2054 by Neta Mends, RN Outcome: Progressing   Problem: Coping: Goal: Psychosocial and spiritual needs will be supported 04/03/2020 2054 by Neta Mends, RN Outcome: Progressing 04/03/2020 2054 by Neta Mends, RN Outcome: Progressing   Problem: Respiratory: Goal: Will maintain a patent airway 04/03/2020 2054 by Neta Mends, RN Outcome: Progressing 04/03/2020 2054 by Neta Mends, RN Outcome: Progressing Goal: Complications related to the disease process, condition or treatment will be avoided or minimized 04/03/2020 2054 by Neta Mends, RN Outcome: Progressing 04/03/2020 2054 by Neta Mends, RN Outcome: Progressing   Problem: Education: Goal: Knowledge of General Education information will improve Description: Including pain rating scale, medication(s)/side effects and non-pharmacologic comfort measures 04/03/2020 2054 by Neta Mends, RN Outcome: Progressing 04/03/2020 2054 by Neta Mends, RN Outcome: Progressing   Problem: Health Behavior/Discharge Planning: Goal: Ability to manage health-related needs will improve 04/03/2020 2054 by Neta Mends, RN Outcome: Progressing 04/03/2020 2054 by Neta Mends, RN Outcome: Progressing   Problem: Clinical Measurements: Goal: Ability to maintain clinical measurements within normal limits will improve 04/03/2020 2054 by Neta Mends, RN Outcome: Progressing 04/03/2020 2054 by Neta Mends, RN Outcome: Progressing Goal: Will remain free from infection 04/03/2020 2054 by Neta Mends, RN Outcome: Progressing 04/03/2020 2054 by Neta Mends, RN Outcome: Progressing Goal: Diagnostic test results will improve 04/03/2020 2054 by Neta Mends, RN Outcome:  Progressing 04/03/2020 2054 by Neta Mends, RN Outcome: Progressing Goal: Respiratory complications will improve 04/03/2020 2054 by Neta Mends, RN Outcome: Progressing 04/03/2020 2054 by Neta Mends, RN Outcome: Progressing Goal: Cardiovascular complication will be avoided 04/03/2020 2054 by Neta Mends, RN Outcome: Progressing 04/03/2020 2054 by Neta Mends, RN Outcome: Progressing   Problem: Activity: Goal: Risk for activity intolerance will decrease 04/03/2020 2054 by Neta Mends, RN Outcome: Progressing 04/03/2020 2054 by Neta Mends, RN Outcome: Progressing   Problem: Coping: Goal: Level of anxiety will decrease 04/03/2020 2054 by Neta Mends, RN Outcome: Progressing 04/03/2020 2054 by Neta Mends, RN Outcome: Progressing   Problem: Consults Goal: RH GENERAL PATIENT EDUCATION Description: See Patient Education module for education specifics. Outcome: Progressing Goal: Skin Care Protocol Initiated - if Braden Score 18 or less Description: If consults are not indicated, leave blank or document N/A Outcome: Progressing Goal: Nutrition Consult-if indicated Outcome: Progressing Goal: Diabetes Guidelines if Diabetic/Glucose > 140 Description: If diabetic or lab glucose is > 140 mg/dl - Initiate Diabetes/Hyperglycemia Guidelines & Document Interventions  Outcome: Progressing   Problem: RH BOWEL ELIMINATION Goal: RH STG MANAGE BOWEL WITH ASSISTANCE Description: STG Manage Bowel with Assistance. Outcome: Progressing Goal: RH STG MANAGE BOWEL W/MEDICATION W/ASSISTANCE Description: STG Manage Bowel with Medication with Assistance. Outcome: Progressing   Problem: RH BLADDER ELIMINATION Goal: RH STG MANAGE BLADDER WITH ASSISTANCE Description: STG Manage Bladder With Assistance Outcome: Progressing Goal: RH STG MANAGE BLADDER WITH MEDICATION WITH ASSISTANCE Description: STG Manage Bladder With Medication With  Assistance. Outcome: Progressing Goal: RH STG MANAGE BLADDER WITH EQUIPMENT WITH ASSISTANCE Description: STG Manage Bladder With Equipment With Assistance Outcome: Progressing   Problem: RH SKIN INTEGRITY Goal: RH STG SKIN FREE OF INFECTION/BREAKDOWN Outcome: Progressing Goal: RH STG MAINTAIN SKIN INTEGRITY WITH ASSISTANCE Description: STG Maintain Skin Integrity With Assistance. Outcome: Progressing Goal: RH STG ABLE TO PERFORM INCISION/WOUND CARE W/ASSISTANCE  Description: STG Able To Perform Incision/Wound Care With Assistance. Outcome: Progressing   Problem: RH SAFETY Goal: RH STG ADHERE TO SAFETY PRECAUTIONS W/ASSISTANCE/DEVICE Description: STG Adhere to Safety Precautions With Assistance/Device. Outcome: Progressing Goal: RH STG DECREASED RISK OF FALL WITH ASSISTANCE Description: STG Decreased Risk of Fall With Assistance. Outcome: Progressing   Problem: RH PAIN MANAGEMENT Goal: RH STG PAIN MANAGED AT OR BELOW PT'S PAIN GOAL Outcome: Progressing   Problem: RH KNOWLEDGE DEFICIT GENERAL Goal: RH STG INCREASE KNOWLEDGE OF SELF CARE AFTER HOSPITALIZATION Outcome: Progressing   Problem: RH Vision Goal: RH LTG Vision (Specify) Outcome: Progressing   Problem: RH Pre-functional/Other (Specify) Goal: RH LTG Pre-functional (Specify) Outcome: Progressing Goal: RH LTG Interdisciplinary (Specify) 1 Description: RH LTG Interdisciplinary (Specify)1 Outcome: Progressing Goal: RH LTG Interdisciplinary (Specify) 2 Description: RH LTG Interdisciplinary (Specify) 2  Outcome: Progressing

## 2020-04-03 NOTE — Evaluation (Signed)
Clinical/Bedside Swallow Evaluation Patient Details  Name: Tyrone Roman MRN: [id number] Date of Birth: 05-27-1945  Today's Date: 04/03/2020 Time: SLP Start Time (ACUTE ONLY): 5638 SLP Stop Time (ACUTE ONLY): 0940 SLP Time Calculation (min) (ACUTE ONLY): 15 min  Past Medical History:  Past Medical History:  Diagnosis Date  . Allergy   . Barrett's esophageal ulceration   . BPH (benign prostatic hyperplasia)   . CAD (coronary artery disease)    sees Dr. Mar Daring  . Cataract    both eyes removed   . Colonic polyp   . Concussion with loss of consciousness of 30 minutes or less   . Contact dermatitis   . Contact with or exposure to venereal diseases   . COPD (chronic obstructive pulmonary disease) ([place])    sees Dr. Kara Mead   . Diverticulosis of colon   . Dysphagia   . GERD (gastroesophageal reflux disease)    past hx- on meds   . HNP (herniated nucleus pulposus), lumbar    recurrent  . Hyperlipidemia   . Hypertension   . Laceration of scalp   . Myocardial infarction [facility])    2011- stent placed  . Nephrolithiasis   . Pre-operative cardiovascular examination   . Rheumatoid arthritis [facility])    sees Dr. Gavin Pound   . SOB (shortness of breath)    Past Surgical History:  Past Surgical History:  Procedure Laterality Date  . BACK SURGERY    . CARDIAC CATHETERIZATION N/A 01/19/2016   Procedure: Left Heart Cath and Coronary Angiography;  Surgeon: Burnell Blanks, MD;  Location: [place] [facility];  Service: Cardiovascular;  Laterality: N/A;  . COLONOSCOPY  12/09/2014   per Dr. Silverio Decamp, adenomatous polyps, repeat 3 years   . CORONARY ANGIOPLASTY WITH STENT PLACEMENT    . ESOPHAGOGASTRODUODENOSCOPY (EGD) WITH ESOPHAGEAL DILATION  02/17/09   Barretts esophagus   . EYE SURGERY    . KNEE ARTHROSCOPY Left X 2  . LUMBAR LAMINECTOMY/DECOMPRESSION MICRODISCECTOMY Right 10/07/2017   Procedure: RIGHT LUMBAR ONE-TWO LAMINECTOMY WITH MICRODISCECTOMY;  Surgeon: Consuella Lose, MD;  Location: [place];  Service: Neurosurgery;  Laterality: Right;  . LUMBAR LAMINECTOMY/DECOMPRESSION MICRODISCECTOMY N/A 12/06/2017   Procedure: RECURRENT MICRODISCECTOMY LUMBAR ONE - LUMBAR TWO;  Surgeon: Consuella Lose, MD;  Location: [place];  Service: Neurosurgery;  Laterality: N/A;  . LUMBAR WOUND DEBRIDEMENT N/A 12/11/2017   Procedure: LUMBAR WOUND DEBRIDEMENT;  Surgeon: Consuella Lose, MD;  Location: [place];  Service: Neurosurgery;  Laterality: N/A;  . POLYPECTOMY    . TONSILLECTOMY    . UPPER GASTROINTESTINAL ENDOSCOPY     HPI:  Patient is a 75 y.o. male with PMH: CAD, essential HTN, HLD, RA, BPH, COPD not on chronic oxygen, recently treated for COVID-19 PNA superimposed with bacterial PNA, who presented to ED from SNF due to SOB, fever with 102 degree temp, tachycardia with HR in the 120's, AMS, acute hypoxia with O2 saturation 86% on RA. CT was negative, UA was clear and he was admitted for further care for febrile illness and encephalopathy, CXR revealed multilobar bilateral PNA similar to recent prior studies.   Assessment / Plan / Recommendation Clinical Impression  Patient appears at [facility] for swallow function as per this assessment and input from NT and RN. Patient drank multiple straw sips of thin liquids (water) with timely swallow initiation, no coughing, throat clearing or other overt s/s that would be concerning for aspiration or penetration.NT who was in room at beginning of this evaluation, stated that  patient ate majority of breakfast this morning and she did not observe any coughing or difficulty.SLP secure chatted with patient's RN who also stated that she has not noticed any swallowing issues with his medications. Patient's RR is WFL and his O2 saturations are 93% on RA. SLP is not recommending formal treatment as patient appears to be [facility] for swallow function. SLP Visit Diagnosis: Dysphagia, unspecified (R13.10)    Aspiration Risk  Mild aspiration risk     Diet Recommendation Regular;Thin liquid   Liquid Administration via: Cup;Straw Medication Administration: Whole meds with liquid Supervision: Patient able to self feed Compensations: Minimize environmental distractions;Slow rate;Small sips/bites Postural Changes: Seated upright at 90 degrees    Other  Recommendations Oral Care Recommendations: Oral care BID;Staff/trained caregiver to provide oral care   Follow up Recommendations None      Frequency and Duration   N/A         Prognosis   N/A     Swallow Study   General Date of Onset: 04/03/20 HPI: Patient is a 76 y.o. male with PMH: CAD, essential HTN, HLD, RA, BPH, COPD not on chronic oxygen, recently treated for COVID-19 PNA superimposed with bacterial PNA, who presented to ED from SNF due to SOB, fever with 102 degree temp, tachycardia with HR in the 120's, AMS, acute hypoxia with O2 saturation 86% on RA. CT was negative, UA was clear and he was admitted for further care for febrile illness and encephalopathy, CXR revealed multilobar bilateral PNA similar to recent prior studies. Type of Study: Bedside Swallow Evaluation Previous Swallow Assessment: None found Diet Prior to this Study: Regular;Thin liquids Temperature Spikes Noted: No Respiratory Status: Room air History of Recent Intubation: No Behavior/Cognition: Alert;Cooperative;Pleasant mood;Confused Oral Cavity Assessment: Within Functional Limits Oral Care Completed by SLP: Yes Oral Cavity - Dentition: Adequate natural dentition Vision: Functional for self-feeding Self-Feeding Abilities: Able to feed self;Needs set up Patient Positioning: Upright in bed Baseline Vocal Quality: Hoarse Volitional Cough: Strong Volitional Swallow: Able to elicit    Oral/Motor/Sensory Function Overall Oral Motor/Sensory Function: Within functional limits   Ice Chips     Thin Liquid Thin Liquid: Within functional limits Presentation: Straw;Self Fed    Nectar Thick     Honey Thick      Puree Puree: Not tested   Solid     Solid: Not tested Other Comments: Patient declined any solids but NT in room reported he ate majority of his breakfast and she did not observe any coughing or difficulty with PO intake.      Sonia Baller, MA, CCC-SLP Speech Therapy [facility]

## 2020-04-04 ENCOUNTER — Inpatient Hospital Stay (HOSPITAL_COMMUNITY): Payer: Medicare Other

## 2020-04-04 DIAGNOSIS — R4182 Altered mental status, unspecified: Secondary | ICD-10-CM | POA: Diagnosis not present

## 2020-04-04 LAB — CBC WITH DIFFERENTIAL/PLATELET
Abs Immature Granulocytes: 0.24 10*3/uL — ABNORMAL HIGH (ref 0.00–0.07)
Basophils Absolute: 0 10*3/uL (ref 0.0–0.1)
Basophils Relative: 0 %
Eosinophils Absolute: 0 10*3/uL (ref 0.0–0.5)
Eosinophils Relative: 1 %
HCT: 31.9 % — ABNORMAL LOW (ref 39.0–52.0)
Hemoglobin: 10.7 g/dL — ABNORMAL LOW (ref 13.0–17.0)
Immature Granulocytes: 5 %
Lymphocytes Relative: 8 %
Lymphs Abs: 0.4 10*3/uL — ABNORMAL LOW (ref 0.7–4.0)
MCH: 29.5 pg (ref 26.0–34.0)
MCHC: 33.5 g/dL (ref 30.0–36.0)
MCV: 87.9 fL (ref 80.0–100.0)
Monocytes Absolute: 0.2 10*3/uL (ref 0.1–1.0)
Monocytes Relative: 5 %
Neutro Abs: 3.6 10*3/uL (ref 1.7–7.7)
Neutrophils Relative %: 81 %
Platelets: 206 10*3/uL (ref 150–400)
RBC: 3.63 MIL/uL — ABNORMAL LOW (ref 4.22–5.81)
RDW: 15.3 % (ref 11.5–15.5)
WBC: 4.5 10*3/uL (ref 4.0–10.5)
nRBC: 0 % (ref 0.0–0.2)

## 2020-04-04 LAB — CULTURE, BLOOD (ROUTINE X 2)
Culture: NO GROWTH
Culture: NO GROWTH
Special Requests: ADEQUATE

## 2020-04-04 LAB — COMPREHENSIVE METABOLIC PANEL
ALT: 47 U/L — ABNORMAL HIGH (ref 0–44)
AST: 57 U/L — ABNORMAL HIGH (ref 15–41)
Albumin: 1.6 g/dL — ABNORMAL LOW (ref 3.5–5.0)
Alkaline Phosphatase: 71 U/L (ref 38–126)
Anion gap: 10 (ref 5–15)
BUN: 18 mg/dL (ref 8–23)
CO2: 23 mmol/L (ref 22–32)
Calcium: 8.7 mg/dL — ABNORMAL LOW (ref 8.9–10.3)
Chloride: 104 mmol/L (ref 98–111)
Creatinine, Ser: 1.16 mg/dL (ref 0.61–1.24)
GFR, Estimated: >60 mL/min (ref >60)
Glucose, Bld: 252 mg/dL — ABNORMAL HIGH (ref 70–99)
Potassium: 3.9 mmol/L (ref 3.5–5.1)
Sodium: 137 mmol/L (ref 135–145)
Total Bilirubin: 0.9 mg/dL (ref 0.3–1.2)
Total Protein: 4.5 g/dL — ABNORMAL LOW (ref 6.5–8.1)

## 2020-04-04 LAB — TSH: TSH: 0.49 u[IU]/mL (ref 0.350–4.500)

## 2020-04-04 LAB — URINE CULTURE: Culture: NO GROWTH

## 2020-04-04 LAB — MAGNESIUM: Magnesium: 1.8 mg/dL (ref 1.7–2.4)

## 2020-04-04 LAB — D-DIMER, QUANTITATIVE: D-Dimer, Quant: 1.59 ug/mL-FEU — ABNORMAL HIGH (ref 0.00–0.50)

## 2020-04-04 LAB — C-REACTIVE PROTEIN: CRP: 6.4 mg/dL — ABNORMAL HIGH (ref <1.0)

## 2020-04-04 LAB — BRAIN NATRIURETIC PEPTIDE: B Natriuretic Peptide: 163.1 pg/mL — ABNORMAL HIGH (ref 0.0–100.0)

## 2020-04-04 LAB — PROCALCITONIN: Procalcitonin: <0.1 ng/mL

## 2020-04-04 MED ORDER — METOPROLOL TARTRATE 100 MG PO TABS
100.0000 mg | ORAL_TABLET | Freq: Two times a day (BID) | ORAL | Status: DC
  Administered 2020-04-04 – 2020-04-06 (×5): 100 mg via ORAL
  Filled 2020-04-04 (×5): qty 1

## 2020-04-04 MED ORDER — METOPROLOL TARTRATE 5 MG/5ML IV SOLN
5.0000 mg | Freq: Three times a day (TID) | INTRAVENOUS | Status: DC | PRN
  Administered 2020-04-04: 5 mg via INTRAVENOUS
  Filled 2020-04-04: qty 5

## 2020-04-04 MED ORDER — POTASSIUM CHLORIDE CRYS ER 20 MEQ PO TBCR
40.0000 meq | EXTENDED_RELEASE_TABLET | Freq: Two times a day (BID) | ORAL | Status: AC
  Administered 2020-04-04 (×2): 40 meq via ORAL
  Filled 2020-04-04 (×2): qty 2

## 2020-04-04 MED ORDER — MAGNESIUM SULFATE IN D5W 1-5 GM/100ML-% IV SOLN
1.0000 g | Freq: Once | INTRAVENOUS | Status: AC
  Administered 2020-04-04: 1 g via INTRAVENOUS
  Filled 2020-04-04: qty 100

## 2020-04-04 MED ORDER — POTASSIUM CHLORIDE CRYS ER 20 MEQ PO TBCR: 40.0000 meq | EXTENDED_RELEASE_TABLET | Freq: Once | ORAL | Status: DC

## 2020-04-04 MED ORDER — FUROSEMIDE 10 MG/ML IJ SOLN
40.0000 mg | Freq: Once | INTRAMUSCULAR | Status: AC
  Administered 2020-04-04: 40 mg via INTRAVENOUS
  Filled 2020-04-04: qty 4

## 2020-04-04 NOTE — Evaluation (Signed)
Physical Therapy Evaluation Patient Details Name: Tyrone Roman MRN: [id number] DOB: [birth year] Today's Date: 04/04/2020   History of Present Illness  Pt is a 75 y.o. male recently d/c to [facility] for short-term rehab, now admitted 04/02/20 with SOB, fever, AMS with concern for sepsis. Workup for metabolic encephalopathy, acute hypoxic respiratory failure secondary to recently treated COVID-19 PNA superimposed by bacterial PNA. PMH includes RA, CAD, COPD, HTN, COVID dx (02/29/20).    Clinical Impression  Pt presents with an overall decrease in functional mobility secondary to above. Prior to initial hospitalization, pt independent and lives alone; pt plans to return to SNF for short-term rehab. Today, pt required modA+2 for standing; limited by symptomatic hypotension while upright (see values below; RN aware). Pt also limited by generalized weakness, poor balance and cognitive impairment, including slowed processing. Pt would benefit from continued acute PT services to maximize functional mobility and independence prior to d/c to SNF.   Orthostatic BPs Post-standing transfer 76/56  Seated in recliner 97/72  Seated ~5-min 93/75      Follow Up Recommendations SNF;Supervision for mobility/OOB    Equipment Recommendations   (defer)    Recommendations for Other Services       Precautions / Restrictions Precautions Precautions: Fall;Other (comment) Precaution Comments: watch BP - symptomatic hypotension with standing 2/28      Mobility  Bed Mobility Overal bed mobility: Needs Assistance Bed Mobility: Supine to Sit     Supine to sit: Mod assist     General bed mobility comments: Increased time and effort requiring cues to complete task; modA for scooting hips to EOB    Transfers Overall transfer level: Needs assistance Equipment used: Rolling walker (2 wheeled);2 person hand held assist Transfers: Sit to/from [facility] Sit to Stand: Mod assist;+2  physical assistance Stand pivot transfers: Mod assist;+2 physical assistance       General transfer comment: Initial stand with RW and modA+2, pt able to maintain a few seconds then needed to return to sit; c/o dizziness although BP cuff not reading; stand pivot to recliner with modA+2 and bilateral HHA  Ambulation/Gait             General Gait Details: Deferred secondary to symptomatic hypotension  Stairs            Wheelchair Mobility    Modified Rankin (Stroke Patients Only)       Balance Overall balance assessment: Needs assistance Sitting-balance support: Feet supported Sitting balance-Leahy Scale: Poor Sitting balance - Comments: leaning forard with head in hands   Standing balance support: During functional activity Standing balance-Leahy Scale: Poor Standing balance comment: reliant on external support                             Pertinent Vitals/Pain Pain Assessment: Faces Faces Pain Scale: Hurts a little bit Pain Location: Generalized Pain Descriptors / Indicators: Discomfort Pain Intervention(s): Monitored during session;Limited activity within patient's tolerance    Home Living Family/patient expects to be discharged to:: Skilled nursing facility                      Prior Function Level of Independence: Independent         Comments: Prior to initial onset of Covid, pt reports being independent with community ambulation, completes yardwork, independent with ADLs, drives. Pt has 2 daughters locally, not able to physically assist.     Hand Dominance  Dominant Hand: Right    Extremity/Trunk Assessment   Upper Extremity Assessment Upper Extremity Assessment: Generalized weakness    Lower Extremity Assessment Lower Extremity Assessment: Generalized weakness    Cervical / Trunk Assessment Cervical / Trunk Assessment: Kyphotic  Communication   Communication: No difficulties  Cognition Arousal/Alertness:  Awake/alert Behavior During Therapy: Flat affect Overall Cognitive Status: Impaired/Different from baseline Area of Impairment: Orientation;Attention;Memory;Following commands;Safety/judgement;Awareness;Problem solving                 Orientation Level: Disoriented to;Situation;Time Current Attention Level: Sustained Memory: Decreased recall of precautions;Decreased short-term memory Following Commands: Follows one step commands with increased time Safety/Judgement: Decreased awareness of safety;Decreased awareness of deficits Awareness: Emergent Problem Solving: Slow processing;Decreased initiation;Difficulty sequencing;Requires verbal cues General Comments: "I feel confused"      General Comments General comments (skin integrity, edema, etc.): Post-transfer BP 76/56, seated in recliner w/ legs up 96/72, end of session BP 93/75; SpO2 86-92% on RA -- RN notified of hypotension    Exercises Other Exercises Other Exercises: initiated level 1 theraband with direct S   Assessment/Plan    PT Assessment Patient needs continued PT services  PT Problem List Decreased activity tolerance;Decreased balance;Cardiopulmonary status limiting activity;Decreased strength;Decreased mobility;Decreased cognition;Decreased knowledge of use of DME;Decreased safety awareness       PT Treatment Interventions DME instruction;Gait training;Functional mobility training;Therapeutic activities;Therapeutic exercise;Balance training;Patient/family education;Stair training    PT Goals (Current goals can be found in the Care Plan section)  Acute Rehab PT Goals Patient Stated Goal: get back to normal PT Goal Formulation: With patient Time For Goal Achievement: 04/18/20 Potential to Achieve Goals: Good    Frequency Min 2X/week   Barriers to discharge Decreased caregiver support      Co-evaluation PT/OT/SLP Co-Evaluation/Treatment: Yes Reason for Co-Treatment: Necessary to address cognition/behavior  during functional activity;For patient/therapist safety;To address functional/ADL transfers PT goals addressed during session: Mobility/safety with mobility OT goals addressed during session: ADL's and self-care       AM-PAC PT "6 Clicks" Mobility  Outcome Measure Help needed turning from your back to your side while in a flat bed without using bedrails?: A Lot Help needed moving from lying on your back to sitting on the side of a flat bed without using bedrails?: A Lot Help needed moving to and from a bed to a chair (including a wheelchair)?: A Lot Help needed standing up from a chair using your arms (e.g., wheelchair or bedside chair)?: A Lot Help needed to walk in hospital room?: A Lot Help needed climbing 3-5 steps with a railing? : Total 6 Click Score: 11    End of Session Equipment Utilized During Treatment: Gait belt Activity Tolerance: Patient limited by fatigue;Treatment limited secondary to medical complications (Comment) (hypotension) Patient left: in chair;with call bell/phone within reach;with chair alarm set Nurse Communication: Mobility status PT Visit Diagnosis: Unsteadiness on feet (R26.81);Other abnormalities of gait and mobility (R26.89);Muscle weakness (generalized) (M62.81)    Time: 3335-4562 PT Time Calculation (min) (ACUTE ONLY): 29 min   Charges:   PT Evaluation $PT Eval Moderate Complexity: 1 Mod     Mabeline Caras, PT, DPT Acute Rehabilitation Services  Pager [phone] Office [phone]  Derry Lory 04/04/2020, 1:15 PM

## 2020-04-04 NOTE — Progress Notes (Signed)
Occupational Therapy Evaluation Patient Details Name: Tyrone Roman MRN: [id number] DOB: 10-23-1945 Today's Date: 04/04/2020    History of Present Illness Pt is a 75 y.o. male recently d/c to [facility] for short-term rehab, now admitted 04/02/20 with SOB, fever, AMS with concern for sepsis. Workup for metabolic encephalopathy, acute hypoxic respiratory failure secondary to recently treated COVID-19 PNA superimposed by bacterial PNA. PMH includes RA, CAD, COPD, HTN, COVID dx (02/29/20).   Clinical Impression   Prior to his hospitalizations, pt was living at home independently alone. Pt is a retired Chief Financial Officer, has 2 daughters and enjoys working on cars. Pt with apparent cognitive deficits in addition to generalized deconditioning and will benefit from rehab at [facility]. BP 76/56 s/p transfer to chair. 96/72 after seated @ 5 min with SpO2 86-92 on RA with 2/4 DOE. Nsg aware. Pt very appreciative. Will follow acutely.     Follow Up Recommendations  SNF;Supervision/Assistance - 24 hour    Equipment Recommendations  None recommended by OT    Recommendations for Other Services       Precautions / Restrictions Precautions Precautions: Fall Precaution Comments: watch BP Restrictions Weight Bearing Restrictions: No      Mobility Bed Mobility Overal bed mobility: Needs Assistance Bed Mobility: Supine to Sit     Supine to sit: Mod assist     General bed mobility comments: very slow; A to scoot forward in bed; L lat lean at times    Transfers Overall transfer level: Needs assistance   Transfers: Sit to/from Stand;Stand Pivot Transfers Sit to Stand: Mod assist;+2 physical assistance Stand pivot transfers: Mod assist;+2 physical assistance            Balance Overall balance assessment: Needs assistance   Sitting balance-Leahy Scale: Poor Sitting balance - Comments: leaning forard with head in hands     Standing balance-Leahy Scale: Poor                              ADL either performed or assessed with clinical judgement   ADL Overall ADL's : Needs assistance/impaired Eating/Feeding: Set up   Grooming: Set up;Supervision/safety;Sitting Grooming Details (indicate cue type and reason): required vc to initiate task Upper Body Bathing: Minimal assistance;Sitting   Lower Body Bathing: Maximal assistance;Sit to/from stand   Upper Body Dressing : Moderate assistance;Sitting   Lower Body Dressing: Maximal assistance;Sit to/from stand   Toilet Transfer: +2 for physical assistance;Moderate assistance   Toileting- Clothing Manipulation and Hygiene: Maximal assistance Toileting - Clothing Manipulation Details (indicate cue type and reason): condom cath     Functional mobility during ADLs: Moderate assistance;+2 for physical assistance (stand pivot to chair) with +2 HHA; RW not used due to need for close assist; unable to ambulate this date       Vision         Perception     Praxis      Pertinent Vitals/Pain Pain Assessment: Faces Faces Pain Scale: Hurts a little bit Pain Location: general discomfort Pain Descriptors / Indicators: Grimacing Pain Intervention(s): Limited activity within patient's tolerance     Hand Dominance Right   Extremity/Trunk Assessment Upper Extremity Assessment Upper Extremity Assessment: Generalized weakness   Lower Extremity Assessment Lower Extremity Assessment: Defer to PT evaluation   Cervical / Trunk Assessment Cervical / Trunk Assessment: Kyphotic (forward head)   Communication Communication Communication: No difficulties   Cognition Arousal/Alertness: Awake/alert Behavior During Therapy: Flat affect Overall Cognitive Status: Impaired/Different  from baseline Area of Impairment: Orientation;Attention;Memory;Following commands;Safety/judgement;Awareness;Problem solving                 Orientation Level: Disoriented to;Situation;Time Current Attention Level: Sustained Memory:  Decreased recall of precautions;Decreased short-term memory Following Commands: Follows one step commands with increased time Safety/Judgement: Decreased awareness of safety;Decreased awareness of deficits Awareness: Emergent Problem Solving: Slow processing;Decreased initiation;Difficulty sequencing;Requires verbal cues General Comments: "I feel confused"   General Comments  Likes to work on cars; Pt aware of his confusion and wanting to get to the chair "I don't want to go backwards"; knows he was at rehab for less than a day before having to be readmitted to the hospital    Exercises Exercises: Other exercises Other Exercises Other Exercises: initiated level 1 theraband with direct S   Shoulder Instructions      Home Living Family/patient expects to be discharged to:: Skilled nursing facility                                        Prior Functioning/Environment Level of Independence: Independent        Comments: Prior to initial onset of Covid, pt reports being independent with community ambulation, completes yardwork, independent with ADLs, drives. Pt has 2 daughters locally, not able to physically assist.        OT Problem List: Decreased strength;Decreased activity tolerance;Impaired balance (sitting and/or standing);Decreased cognition;Decreased safety awareness;Decreased knowledge of use of DME or AE;Cardiopulmonary status limiting activity;Pain      OT Treatment/Interventions: Self-care/ADL training;Therapeutic exercise;Neuromuscular education;Energy conservation;DME and/or AE instruction;Therapeutic activities;Cognitive remediation/compensation;Patient/family education;Balance training    OT Goals(Current goals can be found in the care plan section) Acute Rehab OT Goals Patient Stated Goal: get back to normal OT Goal Formulation: With patient Time For Goal Achievement: 04/18/20 Potential to Achieve Goals: Good  OT Frequency: Min 2X/week   Barriers  to D/C:            Co-evaluation PT/OT/SLP Co-Evaluation/Treatment: Yes Reason for Co-Treatment: Necessary to address cognition/behavior during functional activity;For patient/therapist safety;To address functional/ADL transfers   OT goals addressed during session: ADL's and self-care      AM-PAC OT "6 Clicks" Daily Activity     Outcome Measure Help from another person eating meals?: A Little Help from another person taking care of personal grooming?: A Little Help from another person toileting, which includes using toliet, bedpan, or urinal?: A Lot Help from another person bathing (including washing, rinsing, drying)?: A Lot Help from another person to put on and taking off regular upper body clothing?: A Lot Help from another person to put on and taking off regular lower body clothing?: A Lot 6 Click Score: 14   End of Session Equipment Utilized During Treatment: Gait belt Nurse Communication: Mobility status  Activity Tolerance: Patient limited by fatigue Patient left: in chair;with call bell/phone within reach;with chair alarm set  OT Visit Diagnosis: Unsteadiness on feet (R26.81);Other abnormalities of gait and mobility (R26.89);Muscle weakness (generalized) (M62.81);Other symptoms and signs involving cognitive function;Pain Pain - Right/Left:  (general)                Time: 1110-1144 OT Time Calculation (min): 34 min Charges:  OT General Charges $OT Visit: 1 Visit OT Evaluation $OT Eval Moderate Complexity: [place], OT/L   Acute OT Clinical Specialist [place] Pager [phone] Office [phone]   [facility] 04/04/2020, 11:57 AM

## 2020-04-04 NOTE — Progress Notes (Signed)
PROGRESS NOTE                                                                                                                                                                                                             Patient Demographics:    Tyrone Roman, is a 75 y.o. male, DOB - [birth year], BSJ:[id number]  Outpatient Primary MD for the patient is [name], MD    LOS - 1  Admit date - 04/02/2020    Chief Complaint  Patient presents with  . Shortness of Breath       Brief Narrative (HPI from H&P)  -  Tyrone Roman is a 75 y.o. male with medical history significant for coronary artery disease, essential hypertension, hyperlipidemia, rheumatoid arthritis, BPH, COPD not on chronic oxygen, recently treated COVID-19 viral pneumonia superimposed by bacterial pneumonia who presented to [facility] ED from SNF due to shortness of breath, fever with T-max 102, tachycardia with heart rate in the 120s, altered mental status, and acute hypoxia with O2 saturation 86% on room air, of note he has had two treatments for COVID-19 pneumonia in the last couple of months. In the ER work-up was unremarkable with negative CT, inconclusive chest imaging, UA was clear and he was admitted to the hospital for further care for febrile illness and encephalopathy.   Subjective:   Patient in bed, appears comfortable, denies any headache, no fever, no chest pain or pressure, no shortness of breath , no abdominal pain. No focal weakness.   Assessment  & Plan :     1. Febrile illness with hypoxic respiratory failure in a patient with two recent COVID-19 infection admissions - he has been treated with remdesivir recently, I do not think this present illness is due to active COVID-19 infection. Low-dose steroids will continue as CRP is high, will have speech therapy evaluate him and treat him with Unasyn for possible bacterial infection and aspiration. UA is  clear. We'll continue to look for other sources and monitor closely. No sepsis.  Encouraged the patient to sit up in chair in the daytime use I-S and flutter valve for pulmonary toiletry and then prone in bed when at night.  Will advance activity and titrate down oxygen as possible.    2. Deconditioning, possible early dementia. Patient has had two recent admissions to the hospital  and was recently discharged to SNF on the very day of this admission. Supportive care with PT OT. At risk for delirium, minimize narcotics and benzodiazepines.   3. Metabolic encephalopathy. Likely also has undiagnosed early dementia, head CT negative, no focal deficits, minimize benzodiazepines and narcotics, as needed Haldol if needed.  4. Chronic diastolic CHF recent EF 55 to 60%. Continue low-dose beta-blocker and monitor  5. BPH. On Flomax.  6. Dyslipidemia. On statin continue.  7. History of Rheumatoid arthritis. Takes Rituxan every 4 months.  8. HTN with S.Tach - beta-blocker dose adjusted will check TSH as well, stable bladder scan, no pain or distress.  Lab Results  Component Value Date   TSH 0.444 03/18/2020         Condition -   Guarded  Family Communication  :  Daughter Cathe Mons [phone] on 04/03/20, 04/04/20  Code Status :  Full  Consults  :  None  PUD Prophylaxis : PPI   Procedures  :     TTE - 1. Left ventricular ejection fraction, by estimation, is 60 to 65%. The left ventricle has no regional wall motion abnormalities. Left ventricular diastolic parameters are indeterminate.  2. Right ventricular systolic function is normal. The right ventricular size is normal.  3. Limited echo to evaluate LV function.  CT -    No acute intracranial abnormality.   Diffuse interstitial and ground-glass pulmonary infiltrate has overall improved since prior examination, though there is progressive volume loss and bibasilar increasing reticulation, possibly representing atelectasis or  progressive inflammatory change in this region. No central obstructing mass.   No acute intra-abdominal pathology identified. Bilateral nonobstructing nephrolithiasis. Severe sigmoid diverticulosis. Postsurgical changes within the lumbar spine with acquired fusion of L1-2. Aortic Atherosclerosis (ICD10-I70.0).      Disposition Plan  :    Status is: Observation  Dispo:  Patient From: [facility]  Planned Disposition: [facility]  Medically stable for discharge: No     DVT Prophylaxis  :  Lovenox    Lab Results  Component Value Date   PLT 206 04/04/2020    Diet :  Diet Order            DIET SOFT Room service appropriate? Yes; Fluid consistency: Thin  Diet effective now                  Inpatient Medications  Scheduled Meds: . aspirin  81 mg Oral Daily  . benzonatate  100 mg Oral TID  . enoxaparin (LOVENOX) injection  40 mg Subcutaneous Daily  . methylPREDNISolone (SOLU-MEDROL) injection  40 mg Intravenous Daily  . metoprolol tartrate  100 mg Oral BID  . mometasone-formoterol  2 puff Inhalation BID  . pantoprazole  40 mg Oral Daily  . potassium chloride  40 mEq Oral BID  . simvastatin  40 mg Oral Daily  . tamsulosin  0.4 mg Oral Daily   Continuous Infusions: . ampicillin-sulbactam (UNASYN) IV 3 g (04/04/20 0337)   PRN Meds:.albuterol, metoprolol tartrate, nitroGLYCERIN, zolpidem  Antibiotics  :    Anti-infectives (From admission, onward)   Start     Dose/Rate Route Frequency Ordered Stop   04/03/20 1130  Ampicillin-Sulbactam (UNASYN) 3 g in sodium chloride 0.9 % 100 mL IVPB        3 g 200 mL/hr over 30 Minutes Intravenous Every 8 hours 04/03/20 1041     04/03/20 0300  amoxicillin-clavulanate (AUGMENTIN) 875-125 MG per tablet 1 tablet  Status:  Discontinued  1 tablet Oral 2 times daily 04/03/20 0148 04/03/20 0704   04/02/20 2130  aztreonam (AZACTAM) 2 g in sodium chloride 0.9 % 100 mL IVPB  Status:  Discontinued        2 g 200  mL/hr over 30 Minutes Intravenous  Once 04/02/20 2120 04/02/20 2123   04/02/20 2130  metroNIDAZOLE (FLAGYL) IVPB 500 mg  Status:  Discontinued        500 mg 100 mL/hr over 60 Minutes Intravenous  Once 04/02/20 2120 04/02/20 2123   04/02/20 2130  vancomycin (VANCOREADY) IVPB 1000 mg/200 mL  Status:  Discontinued        1,000 mg 200 mL/hr over 60 Minutes Intravenous  Once 04/02/20 2120 04/02/20 2123   04/02/20 2130  vancomycin (VANCOREADY) IVPB 1750 mg/350 mL        1,750 mg 175 mL/hr over 120 Minutes Intravenous  Once 04/02/20 2123 04/03/20 0047   04/02/20 2130  piperacillin-tazobactam (ZOSYN) IVPB 3.375 g        3.375 g 100 mL/hr over 30 Minutes Intravenous  Once 04/02/20 2123 04/03/20 0008       Time Spent in minutes  30   Lala Lund M.D on 04/04/2020 at 10:25 AM  To page go to [email or web address]   [facility] -  Office  [phone]   See all Orders from today for further details    Objective:   Vitals:   04/03/20 1605 04/03/20 1939 04/03/20 2338 04/04/20 0449  BP: 121/79 131/90 124/81 (!) 132/98  Pulse: 80 88 95 96  Resp: 19 16 20 20   Temp: 97.7 F (36.5 C) 97.9 F (36.6 C) 97.9 F (36.6 C) 97.9 F (36.6 C)  TempSrc: Oral Oral Oral Oral  SpO2: 92% 91% 90% 93%  Weight:      Height:        Wt Readings from Last 3 Encounters:  04/03/20 94.4 kg  03/29/20 91 kg  02/29/20 98 kg     Intake/Output Summary (Last 24 hours) at 04/04/2020 1025 Last data filed at 04/04/2020 1011 Gross per 24 hour  Intake 744.34 ml  Output 5850 ml  Net -5105.66 ml     Physical Exam  Awake mildly confused, No new F.N deficits,   [place].AT,PERRAL Supple Neck,No JVD, No cervical lymphadenopathy appriciated.  Symmetrical Chest wall movement, Good air movement bilaterally, CTAB RRR,No Gallops, Rubs or new Murmurs, No Parasternal Heave +ve B.Sounds, Abd Soft, No tenderness, No organomegaly appriciated, No rebound - guarding or rigidity. No Cyanosis, Clubbing or edema, No new Rash  or bruise    Data Review:    CBC Recent Labs  Lab 03/31/20 0430 04/01/20 0421 04/02/20 2131 04/02/20 2145 04/03/20 0348 04/04/20 0126  WBC 2.2* 4.0 5.0  --  4.8 4.5  HGB 10.2* 10.4* 12.0* 11.2* 10.7* 10.7*  HCT 31.8* 31.1* 36.9* 33.0* 31.1* 31.9*  PLT 129* 162 249  --  205 206  MCV 89.1 87.6 87.4  --  86.6 87.9  MCH 28.6 29.3 28.4  --  29.8 29.5  MCHC 32.1 33.4 32.5  --  34.4 33.5  RDW 14.9 14.8 15.0  --  14.9 15.3  LYMPHSABS 0.2* 0.3* 0.2*  --  0.4* 0.4*  MONOABS 0.1 0.2 0.2  --  0.2 0.2  EOSABS 0.0 0.0 0.0  --  0.0 0.0  BASOSABS 0.0 0.0 0.0  --  0.0 0.0    Recent Labs  Lab 03/29/20 2119 03/30/20 0446 03/31/20 0430 04/01/20 0421 04/02/20 2131 04/02/20 2145 04/03/20 0021 04/03/20 0348  04/03/20 0727 04/04/20 0126  NA 133* 137 137 137 138 140  --  138  --  137  K 3.7 4.5 4.2 3.5 3.2* 3.2*  --  3.2*  --  3.9  CL 102 103 106 107 106  --   --  107  --  104  CO2 22 24 21* 20* 22  --   --  22  --  23  GLUCOSE 97 115* 204* 319* 208*  --   --  168*  --  252*  BUN 17 20 23  32* 19  --   --  18  --  18  CREATININE 1.32* 1.22 1.10 1.10 1.29*  --   --  1.09  --  1.16  CALCIUM 9.0 9.0 8.8* 8.7* 9.0  --   --  8.6*  --  8.7*  AST  --   --   --   --  116*  --   --  92*  --  57*  ALT  --   --   --   --  74*  --   --  61*  --  47*  ALKPHOS  --   --   --   --  97  --   --  77  --  71  BILITOT  --   --   --   --  0.8  --   --  0.9  --  0.9  ALBUMIN  --   --   --   --  2.0*  --   --  1.7*  --  1.6*  MG 1.7 1.8 1.9  --   --   --   --   --  1.8 1.8  CRP 16.3* 17.4* 17.0*  --   --   --   --   --  7.1* 6.4*  DDIMER 2.05* 2.17* 1.60*  --   --   --   --   --  1.85* 1.59*  PROCALCITON  --   --   --   --   --   --   --  <0.10  --  <0.10  LATICACIDVEN  --   --   --   --  2.5*  --  1.8  --   --   --   INR  --   --   --   --  1.2  --   --   --   --   --   HGBA1C  --   --   --  7.7*  --   --   --   --   --   --   BNP  --   --   --   --   --   --   --   --  463.6* 163.1*     ------------------------------------------------------------------------------------------------------------------ No results for input(s): CHOL, HDL, LDLCALC, TRIG, CHOLHDL, LDLDIRECT in the last 72 hours.  Lab Results  Component Value Date   HGBA1C 7.7 (H) 04/01/2020   ------------------------------------------------------------------------------------------------------------------ No results for input(s): TSH, T4TOTAL, T3FREE, THYROIDAB in the last 72 hours.  Invalid input(s): FREET3  Cardiac Enzymes No results for input(s): CKMB, TROPONINI, MYOGLOBIN in the last 168 hours.  Invalid input(s): CK ------------------------------------------------------------------------------------------------------------------    Component Value Date/Time   BNP 163.1 (H) 04/04/2020 0126    Micro Results Recent Results (from the past 240 hour(s))  Urine culture     Status: Abnormal   Collection Time: 03/25/20  2:36 PM  Specimen: In/Out Cath Urine  Result Value Ref Range Status   Specimen Description   Final    IN/OUT CATH URINE Performed at [facility] [address] [place], [place] [address]    Special Requests   Final    NONE Performed at [facility], [place] [address] [place], [place] [address]    Culture MULTIPLE SPECIES PRESENT, SUGGEST RECOLLECTION (A)  Final   Report Status 03/27/2020 FINAL  Final  Blood Culture (routine x 2)     Status: None   Collection Time: 03/25/20  4:45 PM   Specimen: BLOOD LEFT HAND  Result Value Ref Range Status   Specimen Description   Final    BLOOD LEFT HAND Performed at [facility] [address], [place], [place] [address]    Special Requests   Final    BOTTLES DRAWN AEROBIC AND ANAEROBIC Blood Culture adequate volume Performed at [facility] [address], [place], [place] [address]    Culture   Final    NO GROWTH 5 DAYS Performed at [facility], [place] [address] [place], [place] [address]    Report Status 03/30/2020 FINAL  Final  Blood Culture (routine x 2)     Status: Abnormal   Collection Time: 03/25/20  4:45 PM   Specimen: BLOOD LEFT FOREARM  Result Value Ref Range Status   Specimen Description   Final    BLOOD LEFT FOREARM Performed at [facility] [address], [place], [place] [address]    Special Requests   Final    BOTTLES DRAWN AEROBIC AND ANAEROBIC Blood Culture adequate volume Performed at [facility] [address] [place], [place] [address]    Culture  Setup Time   Final    GRAM POSITIVE COCCI IN CLUSTERS AEROBIC BOTTLE ONLY CRITICAL RESULT CALLED TO, READ BACK BY AND VERIFIED WITH: A PHAM PHARMD @1829  03/26/20 EB    Culture (A)  Final    STAPHYLOCOCCUS HOMINIS THE SIGNIFICANCE OF ISOLATING THIS ORGANISM FROM A SINGLE SET OF BLOOD CULTURES WHEN MULTIPLE SETS ARE DRAWN IS UNCERTAIN. PLEASE NOTIFY THE MICROBIOLOGY DEPARTMENT WITHIN ONE WEEK IF SPECIATION AND SENSITIVITIES ARE REQUIRED. Performed at [facility], [place] [address], [place], [place] [address]    Report Status 03/28/2020 FINAL  Final  Blood Culture ID Panel (Reflexed)     Status: Abnormal   Collection Time: 03/25/20  4:45 PM  Result Value Ref Range Status   Enterococcus faecalis NOT DETECTED NOT DETECTED Final   Enterococcus Faecium NOT DETECTED NOT DETECTED Final   Listeria monocytogenes NOT DETECTED NOT DETECTED Final   Staphylococcus species DETECTED (A) NOT DETECTED Final    Comment: CRITICAL RESULT CALLED TO, READ BACK BY AND VERIFIED WITH: A PHAM PHARMD @1829  03/26/20 EB    Staphylococcus aureus (BCID) NOT DETECTED NOT DETECTED Final   Staphylococcus epidermidis DETECTED (A) NOT DETECTED Final    Comment: Methicillin (oxacillin) resistant coagulase negative staphylococcus. Possible blood culture contaminant (unless isolated from more than one blood culture draw or clinical case suggests  pathogenicity). No antibiotic treatment is indicated for blood  culture contaminants. CRITICAL RESULT CALLED TO, READ BACK BY AND VERIFIED WITH: A PHAM PHARMD @1829  03/26/20 EB    Staphylococcus lugdunensis NOT DETECTED NOT DETECTED Final   Streptococcus species NOT DETECTED NOT DETECTED Final   Streptococcus agalactiae NOT DETECTED NOT DETECTED Final   Streptococcus pneumoniae NOT DETECTED NOT DETECTED Final  Streptococcus pyogenes NOT DETECTED NOT DETECTED Final   A.calcoaceticus-baumannii NOT DETECTED NOT DETECTED Final   Bacteroides fragilis NOT DETECTED NOT DETECTED Final   Enterobacterales NOT DETECTED NOT DETECTED Final   Enterobacter cloacae complex NOT DETECTED NOT DETECTED Final   Escherichia coli NOT DETECTED NOT DETECTED Final   Klebsiella aerogenes NOT DETECTED NOT DETECTED Final   Klebsiella oxytoca NOT DETECTED NOT DETECTED Final   Klebsiella pneumoniae NOT DETECTED NOT DETECTED Final   Proteus species NOT DETECTED NOT DETECTED Final   Salmonella species NOT DETECTED NOT DETECTED Final   Serratia marcescens NOT DETECTED NOT DETECTED Final   Haemophilus influenzae NOT DETECTED NOT DETECTED Final   Neisseria meningitidis NOT DETECTED NOT DETECTED Final   Pseudomonas aeruginosa NOT DETECTED NOT DETECTED Final   Stenotrophomonas maltophilia NOT DETECTED NOT DETECTED Final   Candida albicans NOT DETECTED NOT DETECTED Final   Candida auris NOT DETECTED NOT DETECTED Final   Candida glabrata NOT DETECTED NOT DETECTED Final   Candida krusei NOT DETECTED NOT DETECTED Final   Candida parapsilosis NOT DETECTED NOT DETECTED Final   Candida tropicalis NOT DETECTED NOT DETECTED Final   Cryptococcus neoformans/gattii NOT DETECTED NOT DETECTED Final   Methicillin resistance mecA/C DETECTED (A) NOT DETECTED Final    Comment: CRITICAL RESULT CALLED TO, READ BACK BY AND VERIFIED WITH: A PHAM PHARMD @1829  03/26/20 EB Performed at [facility], [address], [place], [place]  [address]   Resp Panel by RT-PCR (Flu A&B, Covid) Nasopharyngeal Swab     Status: Abnormal   Collection Time: 03/25/20  5:47 PM   Specimen: Nasopharyngeal Swab; Nasopharyngeal(NP) swabs in vial transport medium  Result Value Ref Range Status   SARS Coronavirus 2 by RT PCR POSITIVE (A) NEGATIVE Final    Comment: RESULT CALLED TO, READ BACK BY AND VERIFIED WITH: C SIMPSON AT 1852 ON 03/25/2020 BY JPM (NOTE) SARS-CoV-2 target nucleic acids are DETECTED.  The SARS-CoV-2 RNA is generally detectable in upper respiratory specimens during the acute phase of infection. Positive results are indicative of the presence of the identified virus, but do not rule out bacterial infection or co-infection with other pathogens not detected by the test. Clinical correlation with patient history and other diagnostic information is necessary to determine patient infection status. The expected result is Negative.  Fact Sheet for Patients: EntrepreneurPulse.com.au  Fact Sheet for Healthcare Providers: IncredibleEmployment.be  This test is not yet approved or cleared by the [place] FDA and  has been authorized for detection and/or diagnosis of SARS-CoV-2 by FDA under an Emergency Use Authorization (EUA).  This EUA will remain in effect (meaning this test ca n be used) for the duration of  the COVID-19 declaration under Section 564(b)(1) of the Act, 21 U.S.C. section 360bbb-3(b)(1), unless the authorization is terminated or revoked sooner.     Influenza A by PCR NEGATIVE NEGATIVE Final   Influenza B by PCR NEGATIVE NEGATIVE Final    Comment: (NOTE) The Xpert Xpress SARS-CoV-2/FLU/RSV plus assay is intended as an aid in the diagnosis of influenza from Nasopharyngeal swab specimens and should not be used as a sole basis for treatment. Nasal washings and aspirates are unacceptable for Xpert Xpress SARS-CoV-2/FLU/RSV testing.  Fact Sheet for  Patients: EntrepreneurPulse.com.au  Fact Sheet for Healthcare Providers: IncredibleEmployment.be  This test is not yet approved or cleared by the [place] FDA and has been authorized for detection and/or diagnosis of SARS-CoV-2 by FDA under an Emergency Use Authorization (EUA). This EUA will remain in  effect (meaning this test can be used) for the duration of the COVID-19 declaration under Section 564(b)(1) of the Act, 21 U.S.C. section 360bbb-3(b)(1), unless the authorization is terminated or revoked.  Performed at [facility], [place] [address] [place], [place] [address]   Culture, sputum-assessment     Status: None   Collection Time: 03/25/20  8:38 PM   Specimen: Expectorated Sputum  Result Value Ref Range Status   Specimen Description EXPECTORATED SPUTUM  Final   Special Requests NONE  Final   Sputum evaluation   Final    THIS SPECIMEN IS ACCEPTABLE FOR SPUTUM CULTURE Performed at [facility], [place] [address] [place], [place] [address]    Report Status 03/25/2020 FINAL  Final  MRSA PCR Screening     Status: None   Collection Time: 03/25/20  8:38 PM   Specimen: Nasal Mucosa; Nasopharyngeal  Result Value Ref Range Status   MRSA by PCR NEGATIVE NEGATIVE Final    Comment:        The GeneXpert MRSA Assay (FDA approved for NASAL specimens only), is one component of a comprehensive MRSA colonization surveillance program. It is not intended to diagnose MRSA infection nor to guide or monitor treatment for MRSA infections. Performed at [facility], [place] [address] [place], [place] [address]   Culture, Respiratory w Gram Stain     Status: None   Collection Time: 03/25/20  8:38 PM  Result Value Ref Range Status   Specimen Description   Final    EXPECTORATED SPUTUM Performed at [facility] [address] [place], [place] [address]    Special Requests    Final    NONE Reflexed from [id number] Performed at [facility] [address], [place], [place] [address]    Gram Stain   Final    NO WBC SEEN FEW SQUAMOUS EPITHELIAL CELLS PRESENT ABUNDANT GRAM POSITIVE COCCI ABUNDANT GRAM NEGATIVE RODS MODERATE GRAM POSITIVE RODS    Culture   Final    ABUNDANT Consistent with normal respiratory flora. No Pseudomonas species isolated Performed at [facility] [address] [place], [place] [address]    Report Status 03/28/2020 FINAL  Final  Culture, blood (routine x 2)     Status: None   Collection Time: 03/27/20  9:23 AM   Specimen: BLOOD RIGHT HAND  Result Value Ref Range Status   Specimen Description   Final    BLOOD RIGHT HAND Performed at [facility], [place] [address] [place], [place] [address]    Special Requests   Final    BOTTLES DRAWN AEROBIC ONLY Blood Culture results may not be optimal due to an inadequate volume of blood received in culture bottles Performed at [facility] [address], [place], [place] [address]    Culture   Final    NO GROWTH 5 DAYS Performed at [facility], [place] [address] [place], [place] [address]    Report Status 04/01/2020 FINAL  Final  Culture, blood (routine x 2)     Status: None   Collection Time: 03/27/20  9:23 AM   Specimen: BLOOD LEFT HAND  Result Value Ref Range Status   Specimen Description   Final    BLOOD LEFT HAND Performed at [facility], [place] [address] [place], [place] [address]    Special Requests   Final    BOTTLES DRAWN AEROBIC AND ANAEROBIC Blood Culture results may not  be optimal due to an inadequate volume of blood received in culture bottles Performed at [facility] [address] [place], [place] [address]    Culture   Final    NO GROWTH 5 DAYS Performed at [facility], [place] [address] [place], [place] [address]    Report Status 04/01/2020 FINAL  Final  Culture, blood (routine  x 2)     Status: None (Preliminary result)   Collection Time: 03/30/20  1:29 PM   Specimen: BLOOD RIGHT HAND  Result Value Ref Range Status   Specimen Description   Final    BLOOD RIGHT HAND Performed at [facility] [address] [place], [place] [address]    Special Requests   Final    BOTTLES DRAWN AEROBIC ONLY Blood Culture results may not be optimal due to an inadequate volume of blood received in culture bottles Performed at [facility] [address] [place], [place] [address]    Culture   Final    NO GROWTH 4 DAYS Performed at [facility], [place] [address], [place], [place] [address]    Report Status PENDING  Incomplete  Culture, blood (routine x 2)     Status: None (Preliminary result)   Collection Time: 03/30/20  1:29 PM   Specimen: BLOOD RIGHT HAND  Result Value Ref Range Status   Specimen Description   Final    BLOOD RIGHT HAND Performed at [facility] [address] [place], [place] [address]    Special Requests   Final    BOTTLES DRAWN AEROBIC AND ANAEROBIC Blood Culture adequate volume Performed at [facility] [address], [place], [place] [address]    Culture   Final    NO GROWTH 4 DAYS Performed at [facility], [place] [address] [place],  [place] [address]    Report Status PENDING  Incomplete  Culture, Urine     Status: None   Collection Time: 03/30/20  5:04 PM   Specimen: Urine, Random  Result Value Ref Range Status   Specimen Description   Final    URINE, RANDOM Performed at [facility] [address] [place], [place] [address]    Special Requests   Final    NONE Performed at [facility], [place] [address] [place], [place] [address]    Culture   Final    NO GROWTH Performed at [facility], [place] [address], [place], [place] [address]    Report Status 04/01/2020 FINAL  Final  Blood Culture (routine x 2)      Status: None (Preliminary result)   Collection Time: 04/02/20  9:12 PM   Specimen: BLOOD  Result Value Ref Range Status   Specimen Description BLOOD RIGHT HAND  Final   Special Requests   Final    BOTTLES DRAWN AEROBIC AND ANAEROBIC Blood Culture results may not be optimal due to an excessive volume of blood received in culture bottles   Culture   Final    NO GROWTH < 24 HOURS Performed at [facility], [place] [address], [place],  [address]    Report Status PENDING  Incomplete  Blood Culture (routine x 2)     Status: None (Preliminary result)   Collection Time: 04/02/20  9:31 PM   Specimen: BLOOD  Result Value Ref Range Status   Specimen Description BLOOD LEFT WRIST  Final   Special Requests  Final    BOTTLES DRAWN AEROBIC AND ANAEROBIC Blood Culture results may not be optimal due to an inadequate volume of blood received in culture bottles   Culture   Final    NO GROWTH < 12 HOURS Performed at [facility] [address] [place], [place] [address]    Report Status PENDING  Incomplete  Urine culture     Status: None   Collection Time: 04/03/20 12:17 AM   Specimen: In/Out Cath Urine  Result Value Ref Range Status   Specimen Description IN/OUT CATH URINE  Final   Special Requests NONE  Final   Culture   Final    NO GROWTH Performed at [facility], [place] [address] [place], [place] [address]    Report Status 04/04/2020 FINAL  Final    Radiology Reports DG Chest 2 View  Result Date: 03/16/2020 CLINICAL DATA:  Chest pain, COVID-19 positive. EXAM: CHEST - 2 VIEW COMPARISON:  February 29, 2020. FINDINGS: The heart size and mediastinal contours are within normal limits. No pneumothorax or pleural effusion is noted. Multiple patchy airspace opacities are noted bilaterally consistent with multifocal pneumonia. The visualized skeletal structures are unremarkable. IMPRESSION: Bilateral multifocal pneumonia. Electronically Signed   By: Marijo Conception M.D.   On:  03/16/2020 13:58   CT Head Wo Contrast  Result Date: 04/02/2020 CLINICAL DATA:  Altered mental status, sepsis, hypoxia, fever EXAM: CT HEAD WITHOUT CONTRAST CT CHEST, ABDOMEN AND PELVIS WITH CONTRAST TECHNIQUE: Contiguous axial images were obtained from the base of the skull through the vertex without intravenous contrast. Multiplanar CT image reconstructions were also generated. Multidetector CT imaging of the chest, abdomen and pelvis was performed following the standard protocol during bolus administration of intravenous contrast. CONTRAST:  164mL OMNIPAQUE IOHEXOL 300 MG/ML  SOLN COMPARISON:  CT head 03/30/2020, CT chest 03/26/2020, CT abdomen pelvis 10/07/2017 FINDINGS: CT HEAD FINDINGS Brain: Normal anatomic configuration of the brain. No evidence of acute intracranial hemorrhage or infarct. Mild parenchymal volume loss is commensurate with the patient's age. Mild periventricular white matter changes are present likely reflecting the sequela of small vessel ischemia. No abnormal intra or extra-axial mass lesion. Left posterior fossa parasagittal arachnoid cyst is unchanged. Ventricular size is normal. Cerebellum is unremarkable. Vascular: No asymmetric hyperdense vasculature at the skull base. Skull: Intact Sinuses/Orbits: The paranasal sinuses are clear. The orbits are unremarkable. Other: Mastoid air cells and middle ear cavities are clear. CT CHEST FINDINGS Cardiovascular: Extensive coronary artery calcification predominantly within the a left anterior descending coronary artery. Global cardiac size within normal limits. Mild left ventricular hypertrophy noted, however. No pericardial effusion. The central pulmonary arteries are of normal caliber. Minimal atherosclerotic calcification is seen within the thoracic aorta. No aortic aneurysm. Mediastinum/Nodes: No pathologic thoracic adenopathy. Esophagus unremarkable. Thyroid unremarkable peer Lungs/Pleura: Diffuse bilateral interstitial and ground-glass  pulmonary infiltrate is again noted and has largely improved in the interval since prior examination. Evaluation of the pulmonary parenchyma is slightly limited by motion artifact. There is, however, progressive volume loss identified with increasing reticulation at the lung bases possibly representing progressive atelectasis or developing inflammatory change. No pneumothorax or pleural effusion. Central airways are patent. Musculoskeletal: No acute bone abnormality. No lytic or blastic bone lesion. CT ABDOMEN PELVIS FINDINGS Hepatobiliary: No focal liver abnormality is seen. No gallstones, gallbladder wall thickening, or biliary dilatation. Pancreas: Unremarkable Spleen: Unremarkable Adrenals/Urinary Tract: The adrenal glands are unremarkable. The kidneys are normal in size and position. Multiple punctate nonobstructing calculi are noted  within the kidneys bilaterally. Several larger calculi measuring up to 10 mm are noted within the lower pole of the left kidney adjacent to a caliceal diverticulum. Simple cortical cyst noted within the interpolar region of the right kidney. The kidneys are otherwise unremarkable. No hydronephrosis. No ureteral calculi. The bladder is unremarkable. Stomach/Bowel: Severe sigmoid diverticulosis. The stomach, small bowel, and large bowel are otherwise unremarkable. Appendix normal. No free intraperitoneal gas or fluid. Vascular/Lymphatic: Mild aortoiliac atherosclerotic calcification. No aortic aneurysm. No pathologic adenopathy within the abdomen and pelvis. Reproductive: Prostate is unremarkable. Other: No abdominal wall hernia.  The rectum is unremarkable. Musculoskeletal: No acute bone abnormality. There has developed acquired fusion of the a L1 and L2 vertebral bodies with a mild resultant focal kyphotic deformity at this level. No superimposed osseous erosion noted. Right L2 hemilaminectomy has been performed. No lytic or blastic bone lesions. IMPRESSION: No acute intracranial  abnormality. Diffuse interstitial and ground-glass pulmonary infiltrate has overall improved since prior examination, though there is progressive volume loss and bibasilar increasing reticulation, possibly representing atelectasis or progressive inflammatory change in this region. No central obstructing mass. No acute intra-abdominal pathology identified. Bilateral nonobstructing nephrolithiasis. Severe sigmoid diverticulosis. Postsurgical changes within the lumbar spine with acquired fusion of L1-2. Aortic Atherosclerosis (ICD10-I70.0). Electronically Signed   By: Fidela Salisbury MD   On: 04/02/2020 23:48   CT HEAD WO CONTRAST  Result Date: 03/30/2020 CLINICAL DATA:  Mental status changes of unknown etiology. Coronavirus pneumonia. EXAM: CT HEAD WITHOUT CONTRAST TECHNIQUE: Contiguous axial images were obtained from the base of the skull through the vertex without intravenous contrast. COMPARISON:  MRI 12/13/2017 FINDINGS: Brain: No change since prior appearances. Mild age related volume loss. No sign of acute infarction, mass lesion, hemorrhage, hydrocephalus or extra-axial collection. Incidental mega cisterna magna. Vascular: There is atherosclerotic calcification of the major vessels at the base of the brain. Skull: Negative Sinuses/Orbits: Clear/normal Other: None IMPRESSION: No acute or reversible finding. Mild age related volume loss. Atherosclerotic calcification of the major vessels at the base of the brain. Electronically Signed   By: Nelson Chimes M.D.   On: 03/30/2020 19:15   CT ANGIO CHEST PE W OR WO CONTRAST  Result Date: 03/26/2020 CLINICAL DATA:  Shortness of breath.  COVID positive. EXAM: CT ANGIOGRAPHY CHEST WITH CONTRAST TECHNIQUE: Multidetector CT imaging of the chest was performed using the standard protocol during bolus administration of intravenous contrast. Multiplanar CT image reconstructions and MIPs were obtained to evaluate the vascular anatomy. CONTRAST:  153mL OMNIPAQUE IOHEXOL 350  MG/ML SOLN COMPARISON:  03/16/2020 FINDINGS: Cardiovascular: Examination is limited by respiratory motion artifact. Within that limitation, there is no central pulmonary embolus. The size of the main pulmonary artery is normal. Normal heart size with coronary artery calcification. The course and caliber of the aorta are normal. There is mild atherosclerotic calcification. Opacification decreased due to pulmonary arterial phase contrast bolus timing. Mediastinum/Nodes: -- No mediastinal lymphadenopathy. -- No hilar lymphadenopathy. -- No axillary lymphadenopathy. -- No supraclavicular lymphadenopathy. -- Normal thyroid gland where visualized. -  Unremarkable esophagus. Lungs/Pleura: There are trace bilateral pleural effusions. There are diffuse bilateral ground-glass airspace opacities. Emphysematous changes are suspected at the lung apices. There are pleural based calcified and noncalcified plaques. Overall, the airspace disease has worsened since the prior study. There is no pneumothorax. The trachea is unremarkable. There are trace bilateral pleural effusions. Upper Abdomen: Contrast bolus timing is not optimized for evaluation of the abdominal organs. The visualized portions of the organs of the upper  abdomen are normal. Musculoskeletal: No chest wall abnormality. No bony spinal canal stenosis. Review of the MIP images confirms the above findings. IMPRESSION: 1. Examination is limited by respiratory motion artifact. Within that limitation, there is no central pulmonary embolus. 2. Worsening bilateral ground-glass airspace opacities, consistent with the patient's history of viral pneumonia. 3. Trace bilateral pleural effusions. 4. Calcified and noncalcified pleural plaques, consistent with prior asbestos exposure. Aortic Atherosclerosis (ICD10-I70.0) and Emphysema (ICD10-J43.9). Electronically Signed   By: Constance Holster M.D.   On: 03/26/2020 17:23   CT Angio Chest PE W and/or Wo Contrast  Result Date:  03/16/2020 CLINICAL DATA:  COVID, chest pain.  Concern for PE. EXAM: CT ANGIOGRAPHY CHEST WITH CONTRAST TECHNIQUE: Multidetector CT imaging of the chest was performed using the standard protocol during bolus administration of intravenous contrast. Multiplanar CT image reconstructions and MIPs were obtained to evaluate the vascular anatomy. CONTRAST:  39mL OMNIPAQUE IOHEXOL 300 MG/ML  SOLN COMPARISON:  11/27/2010 FINDINGS: Cardiovascular: No filling defects in the pulmonary arteries to suggest pulmonary emboli. Heart is normal size. Aorta is normal caliber. Coronary artery and aortic calcifications. Mediastinum/Nodes: No mediastinal, hilar, or axillary adenopathy. Trachea and esophagus are unremarkable. Thyroid unremarkable. Lungs/Pleura: Patchy peripheral bilateral airspace opacities, favor related to COVID pneumonia. Mild centrilobular emphysema. No effusions. Upper Abdomen: Insert for abdomen Musculoskeletal: Chest wall soft tissues are unremarkable. No acute bony abnormality. Review of the MIP images confirms the above findings. IMPRESSION: No evidence of pulmonary embolus. Patchy bilateral peripheral airspace opacities most compatible with COVID pneumonia. Coronary artery disease. Aortic Atherosclerosis (ICD10-I70.0) and Emphysema (ICD10-J43.9). Electronically Signed   By: Rolm Baptise M.D.   On: 03/16/2020 20:49   CT CHEST ABDOMEN PELVIS W CONTRAST  Result Date: 04/02/2020 CLINICAL DATA:  Altered mental status, sepsis, hypoxia, fever EXAM: CT HEAD WITHOUT CONTRAST CT CHEST, ABDOMEN AND PELVIS WITH CONTRAST TECHNIQUE: Contiguous axial images were obtained from the base of the skull through the vertex without intravenous contrast. Multiplanar CT image reconstructions were also generated. Multidetector CT imaging of the chest, abdomen and pelvis was performed following the standard protocol during bolus administration of intravenous contrast. CONTRAST:  141mL OMNIPAQUE IOHEXOL 300 MG/ML  SOLN COMPARISON:  CT  head 03/30/2020, CT chest 03/26/2020, CT abdomen pelvis 10/07/2017 FINDINGS: CT HEAD FINDINGS Brain: Normal anatomic configuration of the brain. No evidence of acute intracranial hemorrhage or infarct. Mild parenchymal volume loss is commensurate with the patient's age. Mild periventricular white matter changes are present likely reflecting the sequela of small vessel ischemia. No abnormal intra or extra-axial mass lesion. Left posterior fossa parasagittal arachnoid cyst is unchanged. Ventricular size is normal. Cerebellum is unremarkable. Vascular: No asymmetric hyperdense vasculature at the skull base. Skull: Intact Sinuses/Orbits: The paranasal sinuses are clear. The orbits are unremarkable. Other: Mastoid air cells and middle ear cavities are clear. CT CHEST FINDINGS Cardiovascular: Extensive coronary artery calcification predominantly within the a left anterior descending coronary artery. Global cardiac size within normal limits. Mild left ventricular hypertrophy noted, however. No pericardial effusion. The central pulmonary arteries are of normal caliber. Minimal atherosclerotic calcification is seen within the thoracic aorta. No aortic aneurysm. Mediastinum/Nodes: No pathologic thoracic adenopathy. Esophagus unremarkable. Thyroid unremarkable peer Lungs/Pleura: Diffuse bilateral interstitial and ground-glass pulmonary infiltrate is again noted and has largely improved in the interval since prior examination. Evaluation of the pulmonary parenchyma is slightly limited by motion artifact. There is, however, progressive volume loss identified with increasing reticulation at the lung bases possibly representing progressive atelectasis or developing inflammatory change. No  pneumothorax or pleural effusion. Central airways are patent. Musculoskeletal: No acute bone abnormality. No lytic or blastic bone lesion. CT ABDOMEN PELVIS FINDINGS Hepatobiliary: No focal liver abnormality is seen. No gallstones, gallbladder  wall thickening, or biliary dilatation. Pancreas: Unremarkable Spleen: Unremarkable Adrenals/Urinary Tract: The adrenal glands are unremarkable. The kidneys are normal in size and position. Multiple punctate nonobstructing calculi are noted within the kidneys bilaterally. Several larger calculi measuring up to 10 mm are noted within the lower pole of the left kidney adjacent to a caliceal diverticulum. Simple cortical cyst noted within the interpolar region of the right kidney. The kidneys are otherwise unremarkable. No hydronephrosis. No ureteral calculi. The bladder is unremarkable. Stomach/Bowel: Severe sigmoid diverticulosis. The stomach, small bowel, and large bowel are otherwise unremarkable. Appendix normal. No free intraperitoneal gas or fluid. Vascular/Lymphatic: Mild aortoiliac atherosclerotic calcification. No aortic aneurysm. No pathologic adenopathy within the abdomen and pelvis. Reproductive: Prostate is unremarkable. Other: No abdominal wall hernia.  The rectum is unremarkable. Musculoskeletal: No acute bone abnormality. There has developed acquired fusion of the a L1 and L2 vertebral bodies with a mild resultant focal kyphotic deformity at this level. No superimposed osseous erosion noted. Right L2 hemilaminectomy has been performed. No lytic or blastic bone lesions. IMPRESSION: No acute intracranial abnormality. Diffuse interstitial and ground-glass pulmonary infiltrate has overall improved since prior examination, though there is progressive volume loss and bibasilar increasing reticulation, possibly representing atelectasis or progressive inflammatory change in this region. No central obstructing mass. No acute intra-abdominal pathology identified. Bilateral nonobstructing nephrolithiasis. Severe sigmoid diverticulosis. Postsurgical changes within the lumbar spine with acquired fusion of L1-2. Aortic Atherosclerosis (ICD10-I70.0). Electronically Signed   By: Fidela Salisbury MD   On: 04/02/2020  23:48   DG Chest Port 1 View  Result Date: 04/04/2020 CLINICAL DATA:  Shortness of breath, COVID. EXAM: PORTABLE CHEST 1 VIEW COMPARISON:  04/03/2020 and CT chest 04/02/2020. FINDINGS: Patient is slightly rotated. Trachea is midline. Heart size stable. Worsening patchy bilateral airspace opacification with mild coarsening. No definite pleural fluid. IMPRESSION: Worsening COVID-19 pneumonia. Suspect developing pulmonary fibrosis. Electronically Signed   By: Lorin Picket M.D.   On: 04/04/2020 08:10   DG Chest Port 1 View  Result Date: 04/03/2020 CLINICAL DATA:  75 year old male with history of COVID infection complicated by superimposed bacterial pneumonia. EXAM: PORTABLE CHEST 1 VIEW COMPARISON:  Chest x-ray 04/03/2020. FINDINGS: Lung volumes are slightly low. Persistent ill-defined opacities and widespread areas of interstitial prominence noted in the lungs bilaterally (right greater than left), most confluent in the right mid to lower lung. No pleural effusions. No pneumothorax. No evidence of pulmonary edema. Heart size appears borderline enlarged. The patient is rotated to the right on today's exam, resulting in distortion of the mediastinal contours and reduced diagnostic sensitivity and specificity for mediastinal pathology. IMPRESSION: 1. Multilobar bilateral pneumonia appears similar to recent prior studies, as above. Electronically Signed   By: Vinnie Langton M.D.   On: 04/03/2020 07:53   DG Chest Port 1 View  Result Date: 04/03/2020 CLINICAL DATA:  COVID positive EXAM: PORTABLE CHEST 1 VIEW COMPARISON:  CT chest April 02, 2020 FINDINGS: The heart size and mediastinal contours are mildly enlarged. Hazy ground-glass interstitial airspace opacities bilaterally are again seen throughout both lungs. There is prominence of the central pulmonary vasculature. No acute osseous abnormality. IMPRESSION: Hazy ground-glass airspace opacities seen throughout both lungs, unchanged is seen prior CT of  April 02, 2020, likely consistent with multifocal pneumonia Electronically Signed   By: Kerby Moors  Avutu M.D.   On: 04/03/2020 00:45   DG Chest Port 1 View  Result Date: 03/30/2020 CLINICAL DATA:  Fever.  History of COPD. EXAM: PORTABLE CHEST 1 VIEW COMPARISON:  CT chest 03/26/2020. Single-view of the chest 03/25/2020. FINDINGS: Hazy multifocal airspace disease has worsened since the prior plain film. Heart size is normal. No pneumothorax or pleural effusion. Emphysema again noted. No acute or focal bony abnormality. IMPRESSION: Worsened multifocal pneumonia. Electronically Signed   By: Inge Rise M.D.   On: 03/30/2020 14:11   DG Chest Port 1 View  Result Date: 03/25/2020 CLINICAL DATA:  Possible sepsis.  Shortness of breath and confusion. EXAM: PORTABLE CHEST 1 VIEW COMPARISON:  PA and lateral chest and CT chest 03/16/2020. FINDINGS: Patchy bilateral airspace disease persists and appears slightly worse on the left. No pneumothorax or pleural fluid. Heart size is normal. No acute or focal bony abnormality. IMPRESSION: Patchy bilateral airspace disease consistent with pneumonia appears slightly worse on the left compared to the prior exam Electronically Signed   By: Inge Rise M.D.   On: 03/25/2020 14:58   VAS [place] LOWER EXTREMITY VENOUS (DVT)  Result Date: 03/31/2020  Lower Venous DVT Study Indications: Elevated Ddimer.  Risk Factors: COVID 19 positive. Comparison Study: No prior studies. Performing Technologist: Oliver Hum RVT  Examination Guidelines: A complete evaluation includes B-mode imaging, spectral Doppler, color Doppler, and power Doppler as needed of all accessible portions of each vessel. Bilateral testing is considered an integral part of a complete examination. Limited examinations for reoccurring indications may be performed as noted. The reflux portion of the exam is performed with the patient in reverse Trendelenburg.   +---------+---------------+---------+-----------+----------+--------------+ RIGHT    CompressibilityPhasicitySpontaneityPropertiesThrombus Aging +---------+---------------+---------+-----------+----------+--------------+ CFV      Full           Yes      Yes                                 +---------+---------------+---------+-----------+----------+--------------+ SFJ      Full                                                        +---------+---------------+---------+-----------+----------+--------------+ FV Prox  Full                                                        +---------+---------------+---------+-----------+----------+--------------+ FV Mid   Full                                                        +---------+---------------+---------+-----------+----------+--------------+ FV DistalFull                                                        +---------+---------------+---------+-----------+----------+--------------+ PFV      Full                                                        +---------+---------------+---------+-----------+----------+--------------+  POP      Full           Yes      Yes                                 +---------+---------------+---------+-----------+----------+--------------+ PTV      Full                                                        +---------+---------------+---------+-----------+----------+--------------+ PERO     Full                                                        +---------+---------------+---------+-----------+----------+--------------+   +---------+---------------+---------+-----------+----------+--------------+ LEFT     CompressibilityPhasicitySpontaneityPropertiesThrombus Aging +---------+---------------+---------+-----------+----------+--------------+ CFV      Full           Yes      Yes                                  +---------+---------------+---------+-----------+----------+--------------+ SFJ      Full                                                        +---------+---------------+---------+-----------+----------+--------------+ FV Prox  Full                                                        +---------+---------------+---------+-----------+----------+--------------+ FV Mid   Full                                                        +---------+---------------+---------+-----------+----------+--------------+ FV DistalFull                                                        +---------+---------------+---------+-----------+----------+--------------+ PFV      Full                                                        +---------+---------------+---------+-----------+----------+--------------+ POP      Full           Yes      Yes                                 +---------+---------------+---------+-----------+----------+--------------+  PTV      Full                                                        +---------+---------------+---------+-----------+----------+--------------+ PERO     Full                                                        +---------+---------------+---------+-----------+----------+--------------+     Summary: RIGHT: - There is no evidence of deep vein thrombosis in the lower extremity.  - No cystic structure found in the popliteal fossa.  LEFT: - There is no evidence of deep vein thrombosis in the lower extremity.  - No cystic structure found in the popliteal fossa.  *See table(s) above for measurements and observations. Electronically signed by Jamelle Haring on 03/31/2020 at 8:10:57 PM.    Final    ECHOCARDIOGRAM LIMITED  Result Date: 04/03/2020    ECHOCARDIOGRAM LIMITED REPORT   Patient Name:   KATRELL MILHORN Date of Exam: 04/03/2020 Medical Rec #:  [id number]      Height:       74.0 in Accession #:    [id number]     Weight:       208.1  lb Date of Birth:  [birth year]      BSA:          2.211 m Patient Age:    37 years       BP:           133/94 mmHg Patient Gender: M              HR:           106 bpm. Exam Location:  Inpatient Procedure: Limited Echo, Limited Color Doppler and Cardiac Doppler Indications:    Elevated Troponin  History:        Patient has prior history of Echocardiogram examinations, most                 recent 12/13/2017. CAD and Previous Myocardial Infarction, COPD;                 Risk Factors:Hypertension and Dyslipidemia.  Sonographer:    Mikki Santee RDCS (AE) Referring Phys: [id number] Kayleen Memos  Sonographer Comments: Technically difficult study due to poor echo windows. IMPRESSIONS  1. Left ventricular ejection fraction, by estimation, is 60 to 65%. The left ventricle has no regional wall motion abnormalities. Left ventricular diastolic parameters are indeterminate.  2. Right ventricular systolic function is normal. The right ventricular size is normal.  3. Limited echo to evaluate LV function. FINDINGS  Left Ventricle: Left ventricular ejection fraction, by estimation, is 60 to 65%. The left ventricle has no regional wall motion abnormalities. Definity contrast agent was given IV to delineate the left ventricular endocardial borders. The left ventricular internal cavity size was normal in size. Left ventricular diastolic parameters are indeterminate. Right Ventricle: The right ventricular size is normal. Right vetricular wall thickness was not assessed. Right ventricular systolic function is normal. Carlyle Dolly MD Electronically signed by Carlyle Dolly MD Signature Date/Time: 04/03/2020/12:26:10 PM    Final

## 2020-04-05 DIAGNOSIS — R4182 Altered mental status, unspecified: Secondary | ICD-10-CM | POA: Diagnosis not present

## 2020-04-05 LAB — CBC WITH DIFFERENTIAL/PLATELET
Abs Immature Granulocytes: 0.37 10*3/uL — ABNORMAL HIGH (ref 0.00–0.07)
Basophils Absolute: 0 10*3/uL (ref 0.0–0.1)
Basophils Relative: 1 %
Eosinophils Absolute: 0 10*3/uL (ref 0.0–0.5)
Eosinophils Relative: 0 %
HCT: 30.5 % — ABNORMAL LOW (ref 39.0–52.0)
Hemoglobin: 10.5 g/dL — ABNORMAL LOW (ref 13.0–17.0)
Immature Granulocytes: 7 %
Lymphocytes Relative: 7 %
Lymphs Abs: 0.4 10*3/uL — ABNORMAL LOW (ref 0.7–4.0)
MCH: 29.7 pg (ref 26.0–34.0)
MCHC: 34.4 g/dL (ref 30.0–36.0)
MCV: 86.4 fL (ref 80.0–100.0)
Monocytes Absolute: 0.3 10*3/uL (ref 0.1–1.0)
Monocytes Relative: 6 %
Neutro Abs: 4.3 10*3/uL (ref 1.7–7.7)
Neutrophils Relative %: 79 %
Platelets: 231 10*3/uL (ref 150–400)
RBC: 3.53 MIL/uL — ABNORMAL LOW (ref 4.22–5.81)
RDW: 15.5 % (ref 11.5–15.5)
WBC: 5.4 10*3/uL (ref 4.0–10.5)
nRBC: 0 % (ref 0.0–0.2)

## 2020-04-05 LAB — COMPREHENSIVE METABOLIC PANEL
ALT: 40 U/L (ref 0–44)
AST: 37 U/L (ref 15–41)
Albumin: 1.8 g/dL — ABNORMAL LOW (ref 3.5–5.0)
Alkaline Phosphatase: 63 U/L (ref 38–126)
Anion gap: 9 (ref 5–15)
BUN: 23 mg/dL (ref 8–23)
CO2: 21 mmol/L — ABNORMAL LOW (ref 22–32)
Calcium: 9 mg/dL (ref 8.9–10.3)
Chloride: 104 mmol/L (ref 98–111)
Creatinine, Ser: 1.2 mg/dL (ref 0.61–1.24)
GFR, Estimated: >60 mL/min (ref >60)
Glucose, Bld: 228 mg/dL — ABNORMAL HIGH (ref 70–99)
Potassium: 4.4 mmol/L (ref 3.5–5.1)
Sodium: 134 mmol/L — ABNORMAL LOW (ref 135–145)
Total Bilirubin: 0.8 mg/dL (ref 0.3–1.2)
Total Protein: 4.7 g/dL — ABNORMAL LOW (ref 6.5–8.1)

## 2020-04-05 LAB — BRAIN NATRIURETIC PEPTIDE: B Natriuretic Peptide: 143 pg/mL — ABNORMAL HIGH (ref 0.0–100.0)

## 2020-04-05 LAB — C-REACTIVE PROTEIN: CRP: 4.1 mg/dL — ABNORMAL HIGH (ref <1.0)

## 2020-04-05 LAB — MAGNESIUM: Magnesium: 1.8 mg/dL (ref 1.7–2.4)

## 2020-04-05 LAB — D-DIMER, QUANTITATIVE: D-Dimer, Quant: 1.11 ug/mL-FEU — ABNORMAL HIGH (ref 0.00–0.50)

## 2020-04-05 LAB — PROCALCITONIN: Procalcitonin: <0.1 ng/mL

## 2020-04-05 MED ORDER — METHYLPREDNISOLONE SODIUM SUCC 40 MG IJ SOLR
20.0000 mg | Freq: Every day | INTRAMUSCULAR | Status: AC
  Administered 2020-04-06: 20 mg via INTRAVENOUS
  Filled 2020-04-05: qty 1

## 2020-04-05 NOTE — Progress Notes (Signed)
Appropriate Use Committee Chart Review  Chart reviewed by the physician advisor with input from [facility] and the attending MD as needed for review of the appropriateness for SNF referral.  TOC notes, PT/OT/ST notes, nursing notes and physician notes reviewed for medical necessity to determine if the patient's needs are appropriate for short-term rehab to return to a prior level of function versus the likely need for custodial care.  Patient from SNF with plans to return there.     Jacquelynn Cree, MD Chief Physician Advisor  04/05/2020 9:40 AM

## 2020-04-05 NOTE — Progress Notes (Addendum)
PROGRESS NOTE                                                                                                                                                                                                             Patient Demographics:    Tyrone Roman, is a 75 y.o. male, DOB - 09-19-1945, IRW:[id number]  Outpatient Primary MD for the patient is [name], MD    LOS - 2  Admit date - 04/02/2020    Chief Complaint  Patient presents with  . Shortness of Breath       Brief Narrative (HPI from H&P)  -  Tyrone Roman is a 75 y.o. male with medical history significant for coronary artery disease, essential hypertension, hyperlipidemia, rheumatoid arthritis, BPH, COPD not on chronic oxygen, recently treated COVID-19 viral pneumonia superimposed by bacterial pneumonia who presented to [facility] ED from SNF due to shortness of breath, fever with T-max 102, tachycardia with heart rate in the 120s, altered mental status, and acute hypoxia with O2 saturation 86% on room air, of note he has had two treatments for COVID-19 pneumonia in the last couple of months. In the ER work-up was unremarkable with negative CT, inconclusive chest imaging, UA was clear and he was admitted to the hospital for further care for febrile illness and encephalopathy.   Subjective:   Patient in bed, appears comfortable, denies any headache, no fever, no chest pain or pressure, no shortness of breath , no abdominal pain. No focal weakness.   Assessment  & Plan :     1. Febrile illness with hypoxic respiratory failure in a patient with two recent COVID-19 infection admissions - he has been treated with remdesivir recently, I do not think this present illness is due to active COVID-19 infection.  No source of active infection, negative urine and blood cultures thus far, tapering low-dose steroids as his CRP was slightly elevated, much improved and mentation close  to baseline except for mild hospital-acquired delirium/encephalopathy, if stays stable likely discharge to SNF on 04/06/2020.  Finishing short course of Unasyn for suspected but unconfirmed questionable aspiration versus bacterial bronchitis.  Encouraged the patient to sit up in chair in the daytime use I-S and flutter valve for pulmonary toiletry and then prone in bed when at night.  Will advance activity and titrate down oxygen as  possible.    2. Deconditioning, possible early dementia. Patient has had two recent admissions to the hospital and was recently discharged to SNF on the very day of this admission. Supportive care with PT OT. At risk for delirium, minimize narcotics and benzodiazepines.   3. Metabolic encephalopathy. Likely also has undiagnosed early dementia, head CT negative, no focal deficits, minimize benzodiazepines and narcotics, as needed Haldol if needed.  4. Chronic diastolic CHF recent EF 55 to 60%. Continue low-dose beta-blocker and monitor  5. BPH. On Flomax.  6. Dyslipidemia. On statin continue.  7. History of Rheumatoid arthritis. Takes Rituxan every 4 months.  8. HTN with S.Tach - beta-blocker dose adjusted will check TSH as well, stable bladder scan, no pain or distress.  Lab Results  Component Value Date   TSH 0.490 04/04/2020         Condition -   Guarded  Family Communication  :  Daughter Cathe Mons [phone] on 04/03/20, 04/04/20, 04/05/20  Code Status :  Full  Consults  :  None  PUD Prophylaxis : PPI   Procedures  :     TTE - 1. Left ventricular ejection fraction, by estimation, is 60 to 65%. The left ventricle has no regional wall motion abnormalities. Left ventricular diastolic parameters are indeterminate.  2. Right ventricular systolic function is normal. The right ventricular size is normal.  3. Limited echo to evaluate LV function.  CT -    No acute intracranial abnormality.   Diffuse interstitial and ground-glass pulmonary  infiltrate has overall improved since prior examination, though there is progressive volume loss and bibasilar increasing reticulation, possibly representing atelectasis or progressive inflammatory change in this region. No central obstructing mass.   No acute intra-abdominal pathology identified. Bilateral nonobstructing nephrolithiasis. Severe sigmoid diverticulosis. Postsurgical changes within the lumbar spine with acquired fusion of L1-2. Aortic Atherosclerosis (ICD10-I70.0).      Disposition Plan  :    Status is: Inpt  Dispo:  Patient From: [place]  Planned Disposition: [place]  Medically stable for discharge: yes on 04/06/20     DVT Prophylaxis  :  Lovenox    Lab Results  Component Value Date   PLT 231 04/05/2020    Diet :  Diet Order            DIET SOFT Room service appropriate? Yes; Fluid consistency: Thin  Diet effective now                  Inpatient Medications  Scheduled Meds:  . aspirin  81 mg Oral Daily  . benzonatate  100 mg Oral TID  . enoxaparin (LOVENOX) injection  40 mg Subcutaneous Daily  . methylPREDNISolone (SOLU-MEDROL) injection  40 mg Intravenous Daily  . metoprolol tartrate  100 mg Oral BID  . mometasone-formoterol  2 puff Inhalation BID  . pantoprazole  40 mg Oral Daily  . simvastatin  40 mg Oral Daily  . tamsulosin  0.4 mg Oral Daily   Continuous Infusions: . ampicillin-sulbactam (UNASYN) IV 3 g (04/05/20 0607)   PRN Meds:.albuterol, metoprolol tartrate, nitroGLYCERIN, zolpidem  Antibiotics  :    Anti-infectives (From admission, onward)   Start     Dose/Rate Route Frequency Ordered Stop   04/03/20 1130  Ampicillin-Sulbactam (UNASYN) 3 g in sodium chloride 0.9 % 100 mL IVPB        3 g 200 mL/hr over 30 Minutes Intravenous Every 8 hours 04/03/20 1041     04/03/20 0300  amoxicillin-clavulanate (  AUGMENTIN) 875-125 MG per tablet 1 tablet  Status:  Discontinued        1 tablet Oral 2 times daily  04/03/20 0148 04/03/20 0704   04/02/20 2130  aztreonam (AZACTAM) 2 g in sodium chloride 0.9 % 100 mL IVPB  Status:  Discontinued        2 g 200 mL/hr over 30 Minutes Intravenous  Once 04/02/20 2120 04/02/20 2123   04/02/20 2130  metroNIDAZOLE (FLAGYL) IVPB 500 mg  Status:  Discontinued        500 mg 100 mL/hr over 60 Minutes Intravenous  Once 04/02/20 2120 04/02/20 2123   04/02/20 2130  vancomycin (VANCOREADY) IVPB 1000 mg/200 mL  Status:  Discontinued        1,000 mg 200 mL/hr over 60 Minutes Intravenous  Once 04/02/20 2120 04/02/20 2123   04/02/20 2130  vancomycin (VANCOREADY) IVPB 1750 mg/350 mL        1,750 mg 175 mL/hr over 120 Minutes Intravenous  Once 04/02/20 2123 04/03/20 0047   04/02/20 2130  piperacillin-tazobactam (ZOSYN) IVPB 3.375 g        3.375 g 100 mL/hr over 30 Minutes Intravenous  Once 04/02/20 2123 04/03/20 0008       Time Spent in minutes  30   Lala Lund M.D on 04/05/2020 at 11:10 AM  To page go to [email or web address]   [facility] -  Office  [phone]   See all Orders from today for further details    Objective:   Vitals:   04/04/20 1747 04/04/20 2023 04/05/20 0423 04/05/20 0943  BP: 104/69 106/74 (!) 147/95 128/84  Pulse: 79 78 70 76  Resp: 18   17  Temp: 98.7 F (37.1 C) 98 F (36.7 C) (!) 97.5 F (36.4 C) 98.4 F (36.9 C)  TempSrc: Oral Oral Oral   SpO2: 93% 97% 96% 93%  Weight:   89.9 kg   Height:        Wt Readings from Last 3 Encounters:  04/05/20 89.9 kg  03/29/20 91 kg  02/29/20 98 kg     Intake/Output Summary (Last 24 hours) at 04/05/2020 1110 Last data filed at 04/05/2020 0940 Gross per 24 hour  Intake 600 ml  Output 950 ml  Net -350 ml     Physical Exam  Awake, minimally confused, No new F.N deficits, pleasant affect [place].AT,PERRAL Supple Neck,No JVD, No cervical lymphadenopathy appriciated.  Symmetrical Chest wall movement, Good air movement bilaterally, CTAB RRR,No Gallops, Rubs or new Murmurs, No Parasternal  Heave +ve B.Sounds, Abd Soft, No tenderness, No organomegaly appriciated, No rebound - guarding or rigidity. No Cyanosis, Clubbing or edema, No new Rash or bruise     Data Review:    CBC Recent Labs  Lab 04/01/20 0421 04/02/20 2131 04/02/20 2145 04/03/20 0348 04/04/20 0126 04/05/20 0234  WBC 4.0 5.0  --  4.8 4.5 5.4  HGB 10.4* 12.0* 11.2* 10.7* 10.7* 10.5*  HCT 31.1* 36.9* 33.0* 31.1* 31.9* 30.5*  PLT 162 249  --  205 206 231  MCV 87.6 87.4  --  86.6 87.9 86.4  MCH 29.3 28.4  --  29.8 29.5 29.7  MCHC 33.4 32.5  --  34.4 33.5 34.4  RDW 14.8 15.0  --  14.9 15.3 15.5  LYMPHSABS 0.3* 0.2*  --  0.4* 0.4* 0.4*  MONOABS 0.2 0.2  --  0.2 0.2 0.3  EOSABS 0.0 0.0  --  0.0 0.0 0.0  BASOSABS 0.0 0.0  --  0.0 0.0 0.0  Recent Labs  Lab 03/30/20 0446 03/31/20 0430 04/01/20 0421 04/02/20 2131 04/02/20 2145 04/03/20 0021 04/03/20 0348 04/03/20 0727 04/04/20 0126 04/05/20 0234  NA 137 137 137 138 140  --  138  --  137 134*  K 4.5 4.2 3.5 3.2* 3.2*  --  3.2*  --  3.9 4.4  CL 103 106 107 106  --   --  107  --  104 104  CO2 24 21* 20* 22  --   --  22  --  23 21*  GLUCOSE 115* 204* 319* 208*  --   --  168*  --  252* 228*  BUN 20 23 32* 19  --   --  18  --  18 23  CREATININE 1.22 1.10 1.10 1.29*  --   --  1.09  --  1.16 1.20  CALCIUM 9.0 8.8* 8.7* 9.0  --   --  8.6*  --  8.7* 9.0  AST  --   --   --  116*  --   --  92*  --  57* 37  ALT  --   --   --  74*  --   --  61*  --  47* 40  ALKPHOS  --   --   --  97  --   --  77  --  71 63  BILITOT  --   --   --  0.8  --   --  0.9  --  0.9 0.8  ALBUMIN  --   --   --  2.0*  --   --  1.7*  --  1.6* 1.8*  MG 1.8 1.9  --   --   --   --   --  1.8 1.8 1.8  CRP 17.4* 17.0*  --   --   --   --   --  7.1* 6.4* 4.1*  DDIMER 2.17* 1.60*  --   --   --   --   --  1.85* 1.59* 1.11*  PROCALCITON  --   --   --   --   --   --  <0.10  --  <0.10 <0.10  LATICACIDVEN  --   --   --  2.5*  --  1.8  --   --   --   --   INR  --   --   --  1.2  --   --   --   --    --   --   TSH  --   --   --   --   --   --   --   --  0.490  --   HGBA1C  --   --  7.7*  --   --   --   --   --   --   --   BNP  --   --   --   --   --   --   --  463.6* 163.1* 143.0*    ------------------------------------------------------------------------------------------------------------------ No results for input(s): CHOL, HDL, LDLCALC, TRIG, CHOLHDL, LDLDIRECT in the last 72 hours.  Lab Results  Component Value Date   HGBA1C 7.7 (H) 04/01/2020   ------------------------------------------------------------------------------------------------------------------ Recent Labs    04/04/20 0126  TSH 0.490    Cardiac Enzymes No results for input(s): CKMB, TROPONINI, MYOGLOBIN in the last 168 hours.  Invalid input(s): CK ------------------------------------------------------------------------------------------------------------------    Component Value Date/Time   BNP 143.0 (H) 04/05/2020  1950    Micro Results Recent Results (from the past 240 hour(s))  Culture, blood (routine x 2)     Status: None   Collection Time: 03/27/20  9:23 AM   Specimen: BLOOD RIGHT HAND  Result Value Ref Range Status   Specimen Description   Final    BLOOD RIGHT HAND Performed at [facility], [place] [address], [place], [place] [address]    Special Requests   Final    BOTTLES DRAWN AEROBIC ONLY Blood Culture results may not be optimal due to an inadequate volume of blood received in culture bottles Performed at [place] [address], [place], [place] [address]    Culture   Final    NO GROWTH 5 DAYS Performed at [facility], [place] [address], [place], [place] [address]    Report Status 04/01/2020 FINAL  Final  Culture, blood (routine x 2)     Status: None   Collection Time: 03/27/20  9:23 AM   Specimen: BLOOD LEFT HAND  Result Value Ref Range Status   Specimen Description   Final    BLOOD LEFT HAND Performed at [facility], [place] [address]  [place], [place] [address]    Special Requests   Final    BOTTLES DRAWN AEROBIC AND ANAEROBIC Blood Culture results may not be optimal due to an inadequate volume of blood received in culture bottles Performed at [facility] [address], [place], [place] [address]    Culture   Final    NO GROWTH 5 DAYS Performed at [facility], [place] [address] [place], [place] [address]    Report Status 04/01/2020 FINAL  Final  Culture, blood (routine x 2)     Status: None   Collection Time: 03/30/20  1:29 PM   Specimen: BLOOD RIGHT HAND  Result Value Ref Range Status   Specimen Description   Final    BLOOD RIGHT HAND Performed at [facility] [address] [place], [place] [address]    Special Requests   Final    BOTTLES DRAWN AEROBIC ONLY Blood Culture results may not be optimal due to an inadequate volume of blood received in culture bottles Performed at [facility] [address] [place], [place] [address]    Culture   Final    NO GROWTH 5 DAYS Performed at [facility], [place] [address] [place], [place] [address]    Report Status 04/04/2020 FINAL  Final  Culture, blood (routine x 2)     Status: None   Collection Time: 03/30/20  1:29 PM   Specimen: BLOOD RIGHT HAND  Result Value Ref Range Status   Specimen Description   Final    BLOOD RIGHT HAND Performed at [facility] [address], [place], [place] [address]    Special Requests   Final    BOTTLES DRAWN AEROBIC AND ANAEROBIC Blood Culture adequate volume Performed at [facility] [address], [place], [place] [address]    Culture   Final    NO GROWTH 5 DAYS Performed at [facility], [place] [address], [place], [place] [address]    Report Status 04/04/2020 FINAL  Final  Culture, Urine     Status: None   Collection Time: 03/30/20  5:04 PM   Specimen: Urine, Random  Result Value Ref Range Status   Specimen  Description   Final  URINE, RANDOM Performed at [facility], [place] [address], [place], [place] [address]    Special Requests   Final    NONE Performed at [facility], [place] [address] [place], [place] [address]    Culture   Final    NO GROWTH Performed at [facility], [place] [address], [place], [place] [address]    Report Status 04/01/2020 FINAL  Final  Blood Culture (routine x 2)     Status: None (Preliminary result)   Collection Time: 04/02/20  9:12 PM   Specimen: BLOOD  Result Value Ref Range Status   Specimen Description BLOOD RIGHT HAND  Final   Special Requests   Final    BOTTLES DRAWN AEROBIC AND ANAEROBIC Blood Culture results may not be optimal due to an excessive volume of blood received in culture bottles   Culture   Final    NO GROWTH 3 DAYS Performed at [facility], [place] [address], [place], [place] [address]    Report Status PENDING  Incomplete  Blood Culture (routine x 2)     Status: None (Preliminary result)   Collection Time: 04/02/20  9:31 PM   Specimen: BLOOD  Result Value Ref Range Status   Specimen Description BLOOD LEFT WRIST  Final   Special Requests   Final    BOTTLES DRAWN AEROBIC AND ANAEROBIC Blood Culture results may not be optimal due to an inadequate volume of blood received in culture bottles   Culture   Final    NO GROWTH 3 DAYS Performed at [facility], [place] [address], [place], [place] [address]    Report Status PENDING  Incomplete  Urine culture     Status: None   Collection Time: 04/03/20 12:17 AM   Specimen: In/Out Cath Urine  Result Value Ref Range Status   Specimen Description IN/OUT CATH URINE  Final   Special Requests NONE  Final   Culture   Final    NO GROWTH Performed at [facility], [place] [address], [place], [place] [address]    Report Status 04/04/2020 FINAL  Final    Radiology Reports DG Chest 2 View  Result Date: 03/16/2020 CLINICAL DATA:  Chest pain,  COVID-19 positive. EXAM: CHEST - 2 VIEW COMPARISON:  February 29, 2020. FINDINGS: The heart size and mediastinal contours are within normal limits. No pneumothorax or pleural effusion is noted. Multiple patchy airspace opacities are noted bilaterally consistent with multifocal pneumonia. The visualized skeletal structures are unremarkable. IMPRESSION: Bilateral multifocal pneumonia. Electronically Signed   By: Marijo Conception M.D.   On: 03/16/2020 13:58   CT Head Wo Contrast  Result Date: 04/02/2020 CLINICAL DATA:  Altered mental status, sepsis, hypoxia, fever EXAM: CT HEAD WITHOUT CONTRAST CT CHEST, ABDOMEN AND PELVIS WITH CONTRAST TECHNIQUE: Contiguous axial images were obtained from the base of the skull through the vertex without intravenous contrast. Multiplanar CT image reconstructions were also generated. Multidetector CT imaging of the chest, abdomen and pelvis was performed following the standard protocol during bolus administration of intravenous contrast. CONTRAST:  156mL OMNIPAQUE IOHEXOL 300 MG/ML  SOLN COMPARISON:  CT head 03/30/2020, CT chest 03/26/2020, CT abdomen pelvis 10/07/2017 FINDINGS: CT HEAD FINDINGS Brain: Normal anatomic configuration of the brain. No evidence of acute intracranial hemorrhage or infarct. Mild parenchymal volume loss is commensurate with the patient's age. Mild periventricular white matter changes are present likely reflecting the sequela of small vessel ischemia. No abnormal intra or extra-axial mass  lesion. Left posterior fossa parasagittal arachnoid cyst is unchanged. Ventricular size is normal. Cerebellum is unremarkable. Vascular: No asymmetric hyperdense vasculature at the skull base. Skull: Intact Sinuses/Orbits: The paranasal sinuses are clear. The orbits are unremarkable. Other: Mastoid air cells and middle ear cavities are clear. CT CHEST FINDINGS Cardiovascular: Extensive coronary artery calcification predominantly within the a left anterior descending coronary  artery. Global cardiac size within normal limits. Mild left ventricular hypertrophy noted, however. No pericardial effusion. The central pulmonary arteries are of normal caliber. Minimal atherosclerotic calcification is seen within the thoracic aorta. No aortic aneurysm. Mediastinum/Nodes: No pathologic thoracic adenopathy. Esophagus unremarkable. Thyroid unremarkable peer Lungs/Pleura: Diffuse bilateral interstitial and ground-glass pulmonary infiltrate is again noted and has largely improved in the interval since prior examination. Evaluation of the pulmonary parenchyma is slightly limited by motion artifact. There is, however, progressive volume loss identified with increasing reticulation at the lung bases possibly representing progressive atelectasis or developing inflammatory change. No pneumothorax or pleural effusion. Central airways are patent. Musculoskeletal: No acute bone abnormality. No lytic or blastic bone lesion. CT ABDOMEN PELVIS FINDINGS Hepatobiliary: No focal liver abnormality is seen. No gallstones, gallbladder wall thickening, or biliary dilatation. Pancreas: Unremarkable Spleen: Unremarkable Adrenals/Urinary Tract: The adrenal glands are unremarkable. The kidneys are normal in size and position. Multiple punctate nonobstructing calculi are noted within the kidneys bilaterally. Several larger calculi measuring up to 10 mm are noted within the lower pole of the left kidney adjacent to a caliceal diverticulum. Simple cortical cyst noted within the interpolar region of the right kidney. The kidneys are otherwise unremarkable. No hydronephrosis. No ureteral calculi. The bladder is unremarkable. Stomach/Bowel: Severe sigmoid diverticulosis. The stomach, small bowel, and large bowel are otherwise unremarkable. Appendix normal. No free intraperitoneal gas or fluid. Vascular/Lymphatic: Mild aortoiliac atherosclerotic calcification. No aortic aneurysm. No pathologic adenopathy within the abdomen and  pelvis. Reproductive: Prostate is unremarkable. Other: No abdominal wall hernia.  The rectum is unremarkable. Musculoskeletal: No acute bone abnormality. There has developed acquired fusion of the a L1 and L2 vertebral bodies with a mild resultant focal kyphotic deformity at this level. No superimposed osseous erosion noted. Right L2 hemilaminectomy has been performed. No lytic or blastic bone lesions. IMPRESSION: No acute intracranial abnormality. Diffuse interstitial and ground-glass pulmonary infiltrate has overall improved since prior examination, though there is progressive volume loss and bibasilar increasing reticulation, possibly representing atelectasis or progressive inflammatory change in this region. No central obstructing mass. No acute intra-abdominal pathology identified. Bilateral nonobstructing nephrolithiasis. Severe sigmoid diverticulosis. Postsurgical changes within the lumbar spine with acquired fusion of L1-2. Aortic Atherosclerosis (ICD10-I70.0). Electronically Signed   By: Fidela Salisbury MD   On: 04/02/2020 23:48   CT HEAD WO CONTRAST  Result Date: 03/30/2020 CLINICAL DATA:  Mental status changes of unknown etiology. Coronavirus pneumonia. EXAM: CT HEAD WITHOUT CONTRAST TECHNIQUE: Contiguous axial images were obtained from the base of the skull through the vertex without intravenous contrast. COMPARISON:  MRI 12/13/2017 FINDINGS: Brain: No change since prior appearances. Mild age related volume loss. No sign of acute infarction, mass lesion, hemorrhage, hydrocephalus or extra-axial collection. Incidental mega cisterna magna. Vascular: There is atherosclerotic calcification of the major vessels at the base of the brain. Skull: Negative Sinuses/Orbits: Clear/normal Other: None IMPRESSION: No acute or reversible finding. Mild age related volume loss. Atherosclerotic calcification of the major vessels at the base of the brain. Electronically Signed   By: Nelson Chimes M.D.   On: 03/30/2020  19:15   CT ANGIO CHEST PE  W OR WO CONTRAST  Result Date: 03/26/2020 CLINICAL DATA:  Shortness of breath.  COVID positive. EXAM: CT ANGIOGRAPHY CHEST WITH CONTRAST TECHNIQUE: Multidetector CT imaging of the chest was performed using the standard protocol during bolus administration of intravenous contrast. Multiplanar CT image reconstructions and MIPs were obtained to evaluate the vascular anatomy. CONTRAST:  130mL OMNIPAQUE IOHEXOL 350 MG/ML SOLN COMPARISON:  03/16/2020 FINDINGS: Cardiovascular: Examination is limited by respiratory motion artifact. Within that limitation, there is no central pulmonary embolus. The size of the main pulmonary artery is normal. Normal heart size with coronary artery calcification. The course and caliber of the aorta are normal. There is mild atherosclerotic calcification. Opacification decreased due to pulmonary arterial phase contrast bolus timing. Mediastinum/Nodes: -- No mediastinal lymphadenopathy. -- No hilar lymphadenopathy. -- No axillary lymphadenopathy. -- No supraclavicular lymphadenopathy. -- Normal thyroid gland where visualized. -  Unremarkable esophagus. Lungs/Pleura: There are trace bilateral pleural effusions. There are diffuse bilateral ground-glass airspace opacities. Emphysematous changes are suspected at the lung apices. There are pleural based calcified and noncalcified plaques. Overall, the airspace disease has worsened since the prior study. There is no pneumothorax. The trachea is unremarkable. There are trace bilateral pleural effusions. Upper Abdomen: Contrast bolus timing is not optimized for evaluation of the abdominal organs. The visualized portions of the organs of the upper abdomen are normal. Musculoskeletal: No chest wall abnormality. No bony spinal canal stenosis. Review of the MIP images confirms the above findings. IMPRESSION: 1. Examination is limited by respiratory motion artifact. Within that limitation, there is no central pulmonary embolus.  2. Worsening bilateral ground-glass airspace opacities, consistent with the patient's history of viral pneumonia. 3. Trace bilateral pleural effusions. 4. Calcified and noncalcified pleural plaques, consistent with prior asbestos exposure. Aortic Atherosclerosis (ICD10-I70.0) and Emphysema (ICD10-J43.9). Electronically Signed   By: Constance Holster M.D.   On: 03/26/2020 17:23   CT Angio Chest PE W and/or Wo Contrast  Result Date: 03/16/2020 CLINICAL DATA:  COVID, chest pain.  Concern for PE. EXAM: CT ANGIOGRAPHY CHEST WITH CONTRAST TECHNIQUE: Multidetector CT imaging of the chest was performed using the standard protocol during bolus administration of intravenous contrast. Multiplanar CT image reconstructions and MIPs were obtained to evaluate the vascular anatomy. CONTRAST:  59mL OMNIPAQUE IOHEXOL 300 MG/ML  SOLN COMPARISON:  11/27/2010 FINDINGS: Cardiovascular: No filling defects in the pulmonary arteries to suggest pulmonary emboli. Heart is normal size. Aorta is normal caliber. Coronary artery and aortic calcifications. Mediastinum/Nodes: No mediastinal, hilar, or axillary adenopathy. Trachea and esophagus are unremarkable. Thyroid unremarkable. Lungs/Pleura: Patchy peripheral bilateral airspace opacities, favor related to COVID pneumonia. Mild centrilobular emphysema. No effusions. Upper Abdomen: Insert for abdomen Musculoskeletal: Chest wall soft tissues are unremarkable. No acute bony abnormality. Review of the MIP images confirms the above findings. IMPRESSION: No evidence of pulmonary embolus. Patchy bilateral peripheral airspace opacities most compatible with COVID pneumonia. Coronary artery disease. Aortic Atherosclerosis (ICD10-I70.0) and Emphysema (ICD10-J43.9). Electronically Signed   By: Rolm Baptise M.D.   On: 03/16/2020 20:49   CT CHEST ABDOMEN PELVIS W CONTRAST  Result Date: 04/02/2020 CLINICAL DATA:  Altered mental status, sepsis, hypoxia, fever EXAM: CT HEAD WITHOUT CONTRAST CT CHEST,  ABDOMEN AND PELVIS WITH CONTRAST TECHNIQUE: Contiguous axial images were obtained from the base of the skull through the vertex without intravenous contrast. Multiplanar CT image reconstructions were also generated. Multidetector CT imaging of the chest, abdomen and pelvis was performed following the standard protocol during bolus administration of intravenous contrast. CONTRAST:  134mL OMNIPAQUE IOHEXOL 300 MG/ML  SOLN COMPARISON:  CT head 03/30/2020, CT chest 03/26/2020, CT abdomen pelvis 10/07/2017 FINDINGS: CT HEAD FINDINGS Brain: Normal anatomic configuration of the brain. No evidence of acute intracranial hemorrhage or infarct. Mild parenchymal volume loss is commensurate with the patient's age. Mild periventricular white matter changes are present likely reflecting the sequela of small vessel ischemia. No abnormal intra or extra-axial mass lesion. Left posterior fossa parasagittal arachnoid cyst is unchanged. Ventricular size is normal. Cerebellum is unremarkable. Vascular: No asymmetric hyperdense vasculature at the skull base. Skull: Intact Sinuses/Orbits: The paranasal sinuses are clear. The orbits are unremarkable. Other: Mastoid air cells and middle ear cavities are clear. CT CHEST FINDINGS Cardiovascular: Extensive coronary artery calcification predominantly within the a left anterior descending coronary artery. Global cardiac size within normal limits. Mild left ventricular hypertrophy noted, however. No pericardial effusion. The central pulmonary arteries are of normal caliber. Minimal atherosclerotic calcification is seen within the thoracic aorta. No aortic aneurysm. Mediastinum/Nodes: No pathologic thoracic adenopathy. Esophagus unremarkable. Thyroid unremarkable peer Lungs/Pleura: Diffuse bilateral interstitial and ground-glass pulmonary infiltrate is again noted and has largely improved in the interval since prior examination. Evaluation of the pulmonary parenchyma is slightly limited by motion  artifact. There is, however, progressive volume loss identified with increasing reticulation at the lung bases possibly representing progressive atelectasis or developing inflammatory change. No pneumothorax or pleural effusion. Central airways are patent. Musculoskeletal: No acute bone abnormality. No lytic or blastic bone lesion. CT ABDOMEN PELVIS FINDINGS Hepatobiliary: No focal liver abnormality is seen. No gallstones, gallbladder wall thickening, or biliary dilatation. Pancreas: Unremarkable Spleen: Unremarkable Adrenals/Urinary Tract: The adrenal glands are unremarkable. The kidneys are normal in size and position. Multiple punctate nonobstructing calculi are noted within the kidneys bilaterally. Several larger calculi measuring up to 10 mm are noted within the lower pole of the left kidney adjacent to a caliceal diverticulum. Simple cortical cyst noted within the interpolar region of the right kidney. The kidneys are otherwise unremarkable. No hydronephrosis. No ureteral calculi. The bladder is unremarkable. Stomach/Bowel: Severe sigmoid diverticulosis. The stomach, small bowel, and large bowel are otherwise unremarkable. Appendix normal. No free intraperitoneal gas or fluid. Vascular/Lymphatic: Mild aortoiliac atherosclerotic calcification. No aortic aneurysm. No pathologic adenopathy within the abdomen and pelvis. Reproductive: Prostate is unremarkable. Other: No abdominal wall hernia.  The rectum is unremarkable. Musculoskeletal: No acute bone abnormality. There has developed acquired fusion of the a L1 and L2 vertebral bodies with a mild resultant focal kyphotic deformity at this level. No superimposed osseous erosion noted. Right L2 hemilaminectomy has been performed. No lytic or blastic bone lesions. IMPRESSION: No acute intracranial abnormality. Diffuse interstitial and ground-glass pulmonary infiltrate has overall improved since prior examination, though there is progressive volume loss and bibasilar  increasing reticulation, possibly representing atelectasis or progressive inflammatory change in this region. No central obstructing mass. No acute intra-abdominal pathology identified. Bilateral nonobstructing nephrolithiasis. Severe sigmoid diverticulosis. Postsurgical changes within the lumbar spine with acquired fusion of L1-2. Aortic Atherosclerosis (ICD10-I70.0). Electronically Signed   By: Fidela Salisbury MD   On: 04/02/2020 23:48   DG Chest Port 1 View  Result Date: 04/04/2020 CLINICAL DATA:  Shortness of breath, COVID. EXAM: PORTABLE CHEST 1 VIEW COMPARISON:  04/03/2020 and CT chest 04/02/2020. FINDINGS: Patient is slightly rotated. Trachea is midline. Heart size stable. Worsening patchy bilateral airspace opacification with mild coarsening. No definite pleural fluid. IMPRESSION: Worsening COVID-19 pneumonia. Suspect developing pulmonary fibrosis. Electronically Signed   By: Lorin Picket M.D.   On: 04/04/2020 08:10   DG  Chest Port 1 View  Result Date: 04/03/2020 CLINICAL DATA:  75 year old male with history of COVID infection complicated by superimposed bacterial pneumonia. EXAM: PORTABLE CHEST 1 VIEW COMPARISON:  Chest x-ray 04/03/2020. FINDINGS: Lung volumes are slightly low. Persistent ill-defined opacities and widespread areas of interstitial prominence noted in the lungs bilaterally (right greater than left), most confluent in the right mid to lower lung. No pleural effusions. No pneumothorax. No evidence of pulmonary edema. Heart size appears borderline enlarged. The patient is rotated to the right on today's exam, resulting in distortion of the mediastinal contours and reduced diagnostic sensitivity and specificity for mediastinal pathology. IMPRESSION: 1. Multilobar bilateral pneumonia appears similar to recent prior studies, as above. Electronically Signed   By: Vinnie Langton M.D.   On: 04/03/2020 07:53   DG Chest Port 1 View  Result Date: 04/03/2020 CLINICAL DATA:  COVID positive  EXAM: PORTABLE CHEST 1 VIEW COMPARISON:  CT chest April 02, 2020 FINDINGS: The heart size and mediastinal contours are mildly enlarged. Hazy ground-glass interstitial airspace opacities bilaterally are again seen throughout both lungs. There is prominence of the central pulmonary vasculature. No acute osseous abnormality. IMPRESSION: Hazy ground-glass airspace opacities seen throughout both lungs, unchanged is seen prior CT of April 02, 2020, likely consistent with multifocal pneumonia Electronically Signed   By: Prudencio Pair M.D.   On: 04/03/2020 00:45   DG Chest Port 1 View  Result Date: 03/30/2020 CLINICAL DATA:  Fever.  History of COPD. EXAM: PORTABLE CHEST 1 VIEW COMPARISON:  CT chest 03/26/2020. Single-view of the chest 03/25/2020. FINDINGS: Hazy multifocal airspace disease has worsened since the prior plain film. Heart size is normal. No pneumothorax or pleural effusion. Emphysema again noted. No acute or focal bony abnormality. IMPRESSION: Worsened multifocal pneumonia. Electronically Signed   By: Inge Rise M.D.   On: 03/30/2020 14:11   DG Chest Port 1 View  Result Date: 03/25/2020 CLINICAL DATA:  Possible sepsis.  Shortness of breath and confusion. EXAM: PORTABLE CHEST 1 VIEW COMPARISON:  PA and lateral chest and CT chest 03/16/2020. FINDINGS: Patchy bilateral airspace disease persists and appears slightly worse on the left. No pneumothorax or pleural fluid. Heart size is normal. No acute or focal bony abnormality. IMPRESSION: Patchy bilateral airspace disease consistent with pneumonia appears slightly worse on the left compared to the prior exam Electronically Signed   By: Inge Rise M.D.   On: 03/25/2020 14:58   VAS [place] LOWER EXTREMITY VENOUS (DVT)  Result Date: 03/31/2020  Lower Venous DVT Study Indications: Elevated Ddimer.  Risk Factors: COVID 19 positive. Comparison Study: No prior studies. Performing Technologist: Oliver Hum RVT  Examination Guidelines: A complete  evaluation includes B-mode imaging, spectral Doppler, color Doppler, and power Doppler as needed of all accessible portions of each vessel. Bilateral testing is considered an integral part of a complete examination. Limited examinations for reoccurring indications may be performed as noted. The reflux portion of the exam is performed with the patient in reverse Trendelenburg.  +---------+---------------+---------+-----------+----------+--------------+ RIGHT    CompressibilityPhasicitySpontaneityPropertiesThrombus Aging +---------+---------------+---------+-----------+----------+--------------+ CFV      Full           Yes      Yes                                 +---------+---------------+---------+-----------+----------+--------------+ SFJ      Full                                                        +---------+---------------+---------+-----------+----------+--------------+  FV Prox  Full                                                        +---------+---------------+---------+-----------+----------+--------------+ FV Mid   Full                                                        +---------+---------------+---------+-----------+----------+--------------+ FV DistalFull                                                        +---------+---------------+---------+-----------+----------+--------------+ PFV      Full                                                        +---------+---------------+---------+-----------+----------+--------------+ POP      Full           Yes      Yes                                 +---------+---------------+---------+-----------+----------+--------------+ PTV      Full                                                        +---------+---------------+---------+-----------+----------+--------------+ PERO     Full                                                         +---------+---------------+---------+-----------+----------+--------------+   +---------+---------------+---------+-----------+----------+--------------+ LEFT     CompressibilityPhasicitySpontaneityPropertiesThrombus Aging +---------+---------------+---------+-----------+----------+--------------+ CFV      Full           Yes      Yes                                 +---------+---------------+---------+-----------+----------+--------------+ SFJ      Full                                                        +---------+---------------+---------+-----------+----------+--------------+ FV Prox  Full                                                        +---------+---------------+---------+-----------+----------+--------------+  FV Mid   Full                                                        +---------+---------------+---------+-----------+----------+--------------+ FV DistalFull                                                        +---------+---------------+---------+-----------+----------+--------------+ PFV      Full                                                        +---------+---------------+---------+-----------+----------+--------------+ POP      Full           Yes      Yes                                 +---------+---------------+---------+-----------+----------+--------------+ PTV      Full                                                        +---------+---------------+---------+-----------+----------+--------------+ PERO     Full                                                        +---------+---------------+---------+-----------+----------+--------------+     Summary: RIGHT: - There is no evidence of deep vein thrombosis in the lower extremity.  - No cystic structure found in the popliteal fossa.  LEFT: - There is no evidence of deep vein thrombosis in the lower extremity.  - No cystic structure found in the popliteal fossa.   *See table(s) above for measurements and observations. Electronically signed by Jamelle Haring on 03/31/2020 at 8:10:57 PM.    Final    ECHOCARDIOGRAM LIMITED  Result Date: 04/03/2020    ECHOCARDIOGRAM LIMITED REPORT   Patient Name:   AYDYN TESTERMAN Date of Exam: 04/03/2020 Medical Rec #:  [id number]      Height:       74.0 in Accession #:    [id number]     Weight:       208.1 lb Date of Birth:  09-12-1945      BSA:          2.211 m Patient Age:    5 years       BP:           133/94 mmHg Patient Gender: M              HR:           106 bpm. Exam Location:  Inpatient Procedure: Limited Echo, Limited Color Doppler and Cardiac Doppler Indications:  Elevated Troponin  History:        Patient has prior history of Echocardiogram examinations, most                 recent 12/13/2017. CAD and Previous Myocardial Infarction, COPD;                 Risk Factors:Hypertension and Dyslipidemia.  Sonographer:    Mikki Santee RDCS (AE) Referring Phys: [id number] Kayleen Memos  Sonographer Comments: Technically difficult study due to poor echo windows. IMPRESSIONS  1. Left ventricular ejection fraction, by estimation, is 60 to 65%. The left ventricle has no regional wall motion abnormalities. Left ventricular diastolic parameters are indeterminate.  2. Right ventricular systolic function is normal. The right ventricular size is normal.  3. Limited echo to evaluate LV function. FINDINGS  Left Ventricle: Left ventricular ejection fraction, by estimation, is 60 to 65%. The left ventricle has no regional wall motion abnormalities. Definity contrast agent was given IV to delineate the left ventricular endocardial borders. The left ventricular internal cavity size was normal in size. Left ventricular diastolic parameters are indeterminate. Right Ventricle: The right ventricular size is normal. Right vetricular wall thickness was not assessed. Right ventricular systolic function is normal. Carlyle Dolly MD Electronically signed by  Carlyle Dolly MD Signature Date/Time: 04/03/2020/12:26:10 PM    Final

## 2020-04-05 NOTE — Plan of Care (Signed)
  Problem: Education: Goal: Knowledge of risk factors and measures for prevention of condition will improve Outcome: Progressing   Problem: Coping: Goal: Psychosocial and spiritual needs will be supported Outcome: Progressing   Problem: Respiratory: Goal: Will maintain a patent airway Outcome: Progressing Goal: Complications related to the disease process, condition or treatment will be avoided or minimized Outcome: Progressing   Problem: Education: Goal: Knowledge of General Education information will improve Description: Including pain rating scale, medication(s)/side effects and non-pharmacologic comfort measures Outcome: Progressing   Problem: Clinical Measurements: Goal: Ability to maintain clinical measurements within normal limits will improve Outcome: Progressing Goal: Will remain free from infection Outcome: Progressing Goal: Diagnostic test results will improve Outcome: Progressing Goal: Respiratory complications will improve Outcome: Progressing Goal: Cardiovascular complication will be avoided Outcome: Progressing   Problem: Activity: Goal: Risk for activity intolerance will decrease Outcome: Progressing   Problem: Coping: Goal: Level of anxiety will decrease Outcome: Progressing   Problem: Consults Goal: RH GENERAL PATIENT EDUCATION Description: See Patient Education module for education specifics. Outcome: Progressing Goal: Skin Care Protocol Initiated - if Braden Score 18 or less Description: If consults are not indicated, leave blank or document N/A Outcome: Progressing Goal: Nutrition Consult-if indicated Outcome: Progressing Goal: Diabetes Guidelines if Diabetic/Glucose > 140 Description: If diabetic or lab glucose is > 140 mg/dl - Initiate Diabetes/Hyperglycemia Guidelines & Document Interventions  Outcome: Progressing   Problem: RH BLADDER ELIMINATION Goal: RH STG MANAGE BLADDER WITH ASSISTANCE Description: STG Manage Bladder With  Assistance Outcome: Progressing Goal: RH STG MANAGE BLADDER WITH MEDICATION WITH ASSISTANCE Description: STG Manage Bladder With Medication With Assistance. Outcome: Progressing Goal: RH STG MANAGE BLADDER WITH EQUIPMENT WITH ASSISTANCE Description: STG Manage Bladder With Equipment With Assistance Outcome: Progressing   Problem: RH SKIN INTEGRITY Goal: RH STG SKIN FREE OF INFECTION/BREAKDOWN Outcome: Progressing Goal: RH STG MAINTAIN SKIN INTEGRITY WITH ASSISTANCE Description: STG Maintain Skin Integrity With Assistance. Outcome: Progressing Goal: RH STG ABLE TO PERFORM INCISION/WOUND CARE W/ASSISTANCE Description: STG Able To Perform Incision/Wound Care With Assistance. Outcome: Progressing   Problem: RH SAFETY Goal: RH STG ADHERE TO SAFETY PRECAUTIONS W/ASSISTANCE/DEVICE Description: STG Adhere to Safety Precautions With Assistance/Device. Outcome: Progressing Goal: RH STG DECREASED RISK OF FALL WITH ASSISTANCE Description: STG Decreased Risk of Fall With Assistance. Outcome: Progressing   Problem: RH PAIN MANAGEMENT Goal: RH STG PAIN MANAGED AT OR BELOW PT'S PAIN GOAL Outcome: Progressing

## 2020-04-06 DIAGNOSIS — Z0189 Encounter for other specified special examinations: Secondary | ICD-10-CM | POA: Diagnosis not present

## 2020-04-06 DIAGNOSIS — M0689 Other specified rheumatoid arthritis, multiple sites: Secondary | ICD-10-CM | POA: Diagnosis not present

## 2020-04-06 DIAGNOSIS — I951 Orthostatic hypotension: Secondary | ICD-10-CM | POA: Diagnosis not present

## 2020-04-06 DIAGNOSIS — R6510 Systemic inflammatory response syndrome (SIRS) of non-infectious origin without acute organ dysfunction: Secondary | ICD-10-CM | POA: Diagnosis present

## 2020-04-06 DIAGNOSIS — I4510 Unspecified right bundle-branch block: Secondary | ICD-10-CM | POA: Diagnosis present

## 2020-04-06 DIAGNOSIS — K219 Gastro-esophageal reflux disease without esophagitis: Secondary | ICD-10-CM | POA: Diagnosis not present

## 2020-04-06 DIAGNOSIS — M6281 Muscle weakness (generalized): Secondary | ICD-10-CM | POA: Diagnosis not present

## 2020-04-06 DIAGNOSIS — N1832 Chronic kidney disease, stage 3b: Secondary | ICD-10-CM | POA: Diagnosis not present

## 2020-04-06 DIAGNOSIS — R29898 Other symptoms and signs involving the musculoskeletal system: Secondary | ICD-10-CM | POA: Diagnosis not present

## 2020-04-06 DIAGNOSIS — R2681 Unsteadiness on feet: Secondary | ICD-10-CM | POA: Diagnosis not present

## 2020-04-06 DIAGNOSIS — R5081 Fever presenting with conditions classified elsewhere: Secondary | ICD-10-CM | POA: Diagnosis not present

## 2020-04-06 DIAGNOSIS — H353132 Nonexudative age-related macular degeneration, bilateral, intermediate dry stage: Secondary | ICD-10-CM | POA: Diagnosis not present

## 2020-04-06 DIAGNOSIS — M25551 Pain in right hip: Secondary | ICD-10-CM | POA: Diagnosis not present

## 2020-04-06 DIAGNOSIS — N400 Enlarged prostate without lower urinary tract symptoms: Secondary | ICD-10-CM | POA: Diagnosis present

## 2020-04-06 DIAGNOSIS — I25118 Atherosclerotic heart disease of native coronary artery with other forms of angina pectoris: Secondary | ICD-10-CM | POA: Diagnosis not present

## 2020-04-06 DIAGNOSIS — R5381 Other malaise: Secondary | ICD-10-CM | POA: Diagnosis present

## 2020-04-06 DIAGNOSIS — N3941 Urge incontinence: Secondary | ICD-10-CM | POA: Diagnosis not present

## 2020-04-06 DIAGNOSIS — U071 COVID-19: Secondary | ICD-10-CM | POA: Diagnosis not present

## 2020-04-06 DIAGNOSIS — A4189 Other specified sepsis: Secondary | ICD-10-CM | POA: Diagnosis not present

## 2020-04-06 DIAGNOSIS — I82442 Acute embolism and thrombosis of left tibial vein: Secondary | ICD-10-CM | POA: Diagnosis present

## 2020-04-06 DIAGNOSIS — Z789 Other specified health status: Secondary | ICD-10-CM | POA: Diagnosis not present

## 2020-04-06 DIAGNOSIS — J158 Pneumonia due to other specified bacteria: Secondary | ICD-10-CM | POA: Diagnosis not present

## 2020-04-06 DIAGNOSIS — D649 Anemia, unspecified: Secondary | ICD-10-CM | POA: Diagnosis not present

## 2020-04-06 DIAGNOSIS — R7309 Other abnormal glucose: Secondary | ICD-10-CM | POA: Diagnosis not present

## 2020-04-06 DIAGNOSIS — R41841 Cognitive communication deficit: Secondary | ICD-10-CM | POA: Diagnosis not present

## 2020-04-06 DIAGNOSIS — M2550 Pain in unspecified joint: Secondary | ICD-10-CM | POA: Diagnosis not present

## 2020-04-06 DIAGNOSIS — J449 Chronic obstructive pulmonary disease, unspecified: Secondary | ICD-10-CM | POA: Diagnosis not present

## 2020-04-06 DIAGNOSIS — J188 Other pneumonia, unspecified organism: Secondary | ICD-10-CM | POA: Diagnosis not present

## 2020-04-06 DIAGNOSIS — R55 Syncope and collapse: Secondary | ICD-10-CM | POA: Diagnosis not present

## 2020-04-06 DIAGNOSIS — I130 Hypertensive heart and chronic kidney disease with heart failure and stage 1 through stage 4 chronic kidney disease, or unspecified chronic kidney disease: Secondary | ICD-10-CM | POA: Diagnosis present

## 2020-04-06 DIAGNOSIS — R1312 Dysphagia, oropharyngeal phase: Secondary | ICD-10-CM | POA: Diagnosis not present

## 2020-04-06 DIAGNOSIS — M545 Low back pain, unspecified: Secondary | ICD-10-CM | POA: Diagnosis not present

## 2020-04-06 DIAGNOSIS — Z7401 Bed confinement status: Secondary | ICD-10-CM | POA: Diagnosis not present

## 2020-04-06 DIAGNOSIS — I2510 Atherosclerotic heart disease of native coronary artery without angina pectoris: Secondary | ICD-10-CM | POA: Diagnosis present

## 2020-04-06 DIAGNOSIS — U099 Post covid-19 condition, unspecified: Secondary | ICD-10-CM | POA: Diagnosis not present

## 2020-04-06 DIAGNOSIS — G901 Familial dysautonomia [Riley-Day]: Secondary | ICD-10-CM | POA: Diagnosis not present

## 2020-04-06 DIAGNOSIS — R7303 Prediabetes: Secondary | ICD-10-CM | POA: Diagnosis present

## 2020-04-06 DIAGNOSIS — D6949 Other primary thrombocytopenia: Secondary | ICD-10-CM | POA: Diagnosis not present

## 2020-04-06 DIAGNOSIS — R059 Cough, unspecified: Secondary | ICD-10-CM | POA: Diagnosis not present

## 2020-04-06 DIAGNOSIS — I110 Hypertensive heart disease with heart failure: Secondary | ICD-10-CM | POA: Diagnosis not present

## 2020-04-06 DIAGNOSIS — J1282 Pneumonia due to coronavirus disease 2019: Secondary | ICD-10-CM | POA: Diagnosis not present

## 2020-04-06 DIAGNOSIS — G9341 Metabolic encephalopathy: Secondary | ICD-10-CM | POA: Diagnosis not present

## 2020-04-06 DIAGNOSIS — R509 Fever, unspecified: Secondary | ICD-10-CM | POA: Diagnosis not present

## 2020-04-06 DIAGNOSIS — N183 Chronic kidney disease, stage 3 unspecified: Secondary | ICD-10-CM | POA: Diagnosis not present

## 2020-04-06 DIAGNOSIS — D849 Immunodeficiency, unspecified: Secondary | ICD-10-CM | POA: Diagnosis present

## 2020-04-06 DIAGNOSIS — J9611 Chronic respiratory failure with hypoxia: Secondary | ICD-10-CM | POA: Diagnosis not present

## 2020-04-06 DIAGNOSIS — Z515 Encounter for palliative care: Secondary | ICD-10-CM | POA: Diagnosis not present

## 2020-04-06 DIAGNOSIS — M6259 Muscle wasting and atrophy, not elsewhere classified, multiple sites: Secondary | ICD-10-CM | POA: Diagnosis not present

## 2020-04-06 DIAGNOSIS — R404 Transient alteration of awareness: Secondary | ICD-10-CM | POA: Diagnosis not present

## 2020-04-06 DIAGNOSIS — R3989 Other symptoms and signs involving the genitourinary system: Secondary | ICD-10-CM | POA: Diagnosis not present

## 2020-04-06 DIAGNOSIS — H35013 Changes in retinal vascular appearance, bilateral: Secondary | ICD-10-CM | POA: Diagnosis not present

## 2020-04-06 DIAGNOSIS — J440 Chronic obstructive pulmonary disease with acute lower respiratory infection: Secondary | ICD-10-CM | POA: Diagnosis present

## 2020-04-06 DIAGNOSIS — I5033 Acute on chronic diastolic (congestive) heart failure: Secondary | ICD-10-CM | POA: Diagnosis present

## 2020-04-06 DIAGNOSIS — G9340 Encephalopathy, unspecified: Secondary | ICD-10-CM | POA: Diagnosis not present

## 2020-04-06 DIAGNOSIS — Z0184 Encounter for antibody response examination: Secondary | ICD-10-CM | POA: Diagnosis not present

## 2020-04-06 DIAGNOSIS — Z20822 Contact with and (suspected) exposure to covid-19: Secondary | ICD-10-CM | POA: Diagnosis present

## 2020-04-06 DIAGNOSIS — I9589 Other hypotension: Secondary | ICD-10-CM | POA: Diagnosis not present

## 2020-04-06 DIAGNOSIS — K5901 Slow transit constipation: Secondary | ICD-10-CM | POA: Diagnosis not present

## 2020-04-06 DIAGNOSIS — I82412 Acute embolism and thrombosis of left femoral vein: Secondary | ICD-10-CM | POA: Diagnosis present

## 2020-04-06 DIAGNOSIS — Z8616 Personal history of COVID-19: Secondary | ICD-10-CM | POA: Diagnosis not present

## 2020-04-06 DIAGNOSIS — R4182 Altered mental status, unspecified: Secondary | ICD-10-CM | POA: Diagnosis not present

## 2020-04-06 DIAGNOSIS — M069 Rheumatoid arthritis, unspecified: Secondary | ICD-10-CM | POA: Diagnosis present

## 2020-04-06 DIAGNOSIS — R008 Other abnormalities of heart beat: Secondary | ICD-10-CM | POA: Diagnosis not present

## 2020-04-06 DIAGNOSIS — R0902 Hypoxemia: Secondary | ICD-10-CM | POA: Diagnosis not present

## 2020-04-06 DIAGNOSIS — I479 Paroxysmal tachycardia, unspecified: Secondary | ICD-10-CM | POA: Diagnosis not present

## 2020-04-06 DIAGNOSIS — E871 Hypo-osmolality and hyponatremia: Secondary | ICD-10-CM | POA: Diagnosis not present

## 2020-04-06 DIAGNOSIS — Z7189 Other specified counseling: Secondary | ICD-10-CM | POA: Diagnosis not present

## 2020-04-06 DIAGNOSIS — E782 Mixed hyperlipidemia: Secondary | ICD-10-CM | POA: Diagnosis not present

## 2020-04-06 DIAGNOSIS — J189 Pneumonia, unspecified organism: Secondary | ICD-10-CM | POA: Diagnosis not present

## 2020-04-06 DIAGNOSIS — R6889 Other general symptoms and signs: Secondary | ICD-10-CM | POA: Diagnosis not present

## 2020-04-06 DIAGNOSIS — R918 Other nonspecific abnormal finding of lung field: Secondary | ICD-10-CM | POA: Diagnosis not present

## 2020-04-06 DIAGNOSIS — H18593 Other hereditary corneal dystrophies, bilateral: Secondary | ICD-10-CM | POA: Diagnosis not present

## 2020-04-06 DIAGNOSIS — J439 Emphysema, unspecified: Secondary | ICD-10-CM | POA: Diagnosis not present

## 2020-04-06 DIAGNOSIS — R4189 Other symptoms and signs involving cognitive functions and awareness: Secondary | ICD-10-CM | POA: Diagnosis not present

## 2020-04-06 DIAGNOSIS — I208 Other forms of angina pectoris: Secondary | ICD-10-CM | POA: Diagnosis not present

## 2020-04-06 DIAGNOSIS — I10 Essential (primary) hypertension: Secondary | ICD-10-CM | POA: Diagnosis not present

## 2020-04-06 DIAGNOSIS — R000 Tachycardia, unspecified: Secondary | ICD-10-CM | POA: Diagnosis not present

## 2020-04-06 DIAGNOSIS — N401 Enlarged prostate with lower urinary tract symptoms: Secondary | ICD-10-CM | POA: Diagnosis not present

## 2020-04-06 DIAGNOSIS — W1789XA Other fall from one level to another, initial encounter: Secondary | ICD-10-CM | POA: Diagnosis not present

## 2020-04-06 DIAGNOSIS — J984 Other disorders of lung: Secondary | ICD-10-CM | POA: Diagnosis not present

## 2020-04-06 DIAGNOSIS — I82402 Acute embolism and thrombosis of unspecified deep veins of left lower extremity: Secondary | ICD-10-CM | POA: Diagnosis not present

## 2020-04-06 DIAGNOSIS — G478 Other sleep disorders: Secondary | ICD-10-CM | POA: Diagnosis not present

## 2020-04-06 DIAGNOSIS — E440 Moderate protein-calorie malnutrition: Secondary | ICD-10-CM | POA: Diagnosis not present

## 2020-04-06 DIAGNOSIS — R251 Tremor, unspecified: Secondary | ICD-10-CM | POA: Diagnosis not present

## 2020-04-06 DIAGNOSIS — H35033 Hypertensive retinopathy, bilateral: Secondary | ICD-10-CM | POA: Diagnosis not present

## 2020-04-06 DIAGNOSIS — G8929 Other chronic pain: Secondary | ICD-10-CM | POA: Diagnosis not present

## 2020-04-06 DIAGNOSIS — J9600 Acute respiratory failure, unspecified whether with hypoxia or hypercapnia: Secondary | ICD-10-CM | POA: Diagnosis not present

## 2020-04-06 DIAGNOSIS — J9601 Acute respiratory failure with hypoxia: Secondary | ICD-10-CM | POA: Diagnosis present

## 2020-04-06 DIAGNOSIS — R2689 Other abnormalities of gait and mobility: Secondary | ICD-10-CM | POA: Diagnosis not present

## 2020-04-06 DIAGNOSIS — E785 Hyperlipidemia, unspecified: Secondary | ICD-10-CM | POA: Diagnosis present

## 2020-04-06 DIAGNOSIS — A419 Sepsis, unspecified organism: Secondary | ICD-10-CM | POA: Diagnosis not present

## 2020-04-06 DIAGNOSIS — R498 Other voice and resonance disorders: Secondary | ICD-10-CM | POA: Diagnosis not present

## 2020-04-06 DIAGNOSIS — I2699 Other pulmonary embolism without acute cor pulmonale: Secondary | ICD-10-CM | POA: Diagnosis present

## 2020-04-06 DIAGNOSIS — R6520 Severe sepsis without septic shock: Secondary | ICD-10-CM | POA: Diagnosis not present

## 2020-04-06 DIAGNOSIS — R053 Chronic cough: Secondary | ICD-10-CM | POA: Diagnosis not present

## 2020-04-06 DIAGNOSIS — Z66 Do not resuscitate: Secondary | ICD-10-CM | POA: Diagnosis not present

## 2020-04-06 DIAGNOSIS — R0789 Other chest pain: Secondary | ICD-10-CM | POA: Diagnosis not present

## 2020-04-06 DIAGNOSIS — I2693 Single subsegmental pulmonary embolism without acute cor pulmonale: Secondary | ICD-10-CM | POA: Diagnosis not present

## 2020-04-06 DIAGNOSIS — I82409 Acute embolism and thrombosis of unspecified deep veins of unspecified lower extremity: Secondary | ICD-10-CM | POA: Diagnosis not present

## 2020-04-06 MED ORDER — AMOXICILLIN-POT CLAVULANATE 500-125 MG PO TABS
1.0000 | ORAL_TABLET | Freq: Three times a day (TID) | ORAL | Status: DC
  Administered 2020-04-06: 500 mg via ORAL
  Filled 2020-04-06 (×3): qty 1

## 2020-04-06 MED ORDER — ASPIRIN 81 MG PO CHEW: 81.0000 mg | CHEWABLE_TABLET | Freq: Every day | ORAL | Status: DC

## 2020-04-06 MED ORDER — AMOXICILLIN-POT CLAVULANATE 875-125 MG PO TABS: 1.0000 | ORAL_TABLET | Freq: Two times a day (BID) | ORAL | 0 refills | Status: AC

## 2020-04-06 MED ORDER — METOPROLOL TARTRATE 100 MG PO TABS: 100.0000 mg | ORAL_TABLET | Freq: Two times a day (BID) | ORAL | Status: DC

## 2020-04-06 NOTE — TOC Transition Note (Signed)
Transition of Care [facility]) - CM/SW Discharge Note   Patient Details  Name: [name] MRN: [id number] Date of Birth: Jan 24, 1946  [facility] CM/SW Contact:  Ninfa Meeker, RN Phone Number: 04/06/2020, 10:08 AM   Clinical Narrative:    Patient will DC to: [facility] Anticipated DC date: 04/06/20 Family notified: Daughter, [name] Transport by:  PTAR  12:30 pm  Per MD patient is ready for discharge back to [facility].  [place] RN, patient's family and facility have been notified of discharge.Discharge summary and FL2 were sent to Facility. RN to call report prior to discharge to [phone] Room 704. Medical Necessity form to be printed by RN. Ambulance transport has been arranged.   Ricki Miller, RN BSN Case Manager [phone]    Barriers to Discharge: Continued Medical Work up   Patient Goals and CMS Choice Patient states their goals for this hospitalization and ongoing recovery are:: to go home      Discharge Placement                       Discharge Plan and Services   Discharge Planning Services: CM Consult                                 Social Determinants of Health (SDOH) Interventions     Readmission Risk Interventions No flowsheet data found.

## 2020-04-06 NOTE — Plan of Care (Signed)

## 2020-04-06 NOTE — Discharge Instructions (Signed)
Follow with Primary MD [name], MD in 7 days   Get CBC, CMP, 2 view Chest X ray -  checked next visit within 1 week by Primary MD or SNF MD   Activity: As tolerated with Full fall precautions use walker/cane & assistance as needed  Disposition Home    Diet: Soft with feeding assistance and aspiration precautions.   Special Instructions: If you have smoked or chewed Tobacco  in the last 2 yrs please stop smoking, stop any regular Alcohol  and or any Recreational drug use.  On your next visit with your primary care physician please Get Medicines reviewed and adjusted.  Please request your Prim.MD to go over all Hospital Tests and Procedure/Radiological results at the follow up, please get all Hospital records sent to your Prim MD by signing hospital release before you go home.  If you experience worsening of your admission symptoms, develop shortness of breath, life threatening emergency, suicidal or homicidal thoughts you must seek medical attention immediately by calling 911 or calling your MD immediately  if symptoms less severe.  You Must read complete instructions/literature along with all the possible adverse reactions/side effects for all the Medicines you take and that have been prescribed to you. Take any new Medicines after you have completely understood and accpet all the possible adverse reactions/side effects.

## 2020-04-06 NOTE — Discharge Summary (Signed)
Tyrone Roman:[id number] DOB: June 04, 1945 DOA: 04/02/2020  PCP: [name], MD  Admit date: 04/02/2020  Discharge date: 04/06/2020  Admitted From: SNF   Disposition:  SNF   Recommendations for Outpatient Follow-up:   Follow up with PCP in 1-2 weeks  PCP Please obtain BMP/CBC, 2 view CXR in 1week,  (see Discharge instructions)   PCP Please follow up on the following pending results:    Home Health: None   Equipment/Devices: None  Consultations: None  Discharge Condition: Stable    CODE STATUS: Full    Diet Recommendation: Soft  Diet Order            DIET SOFT Room service appropriate? Yes; Fluid consistency: Thin  Diet effective now                  Chief Complaint  Patient presents with  . Shortness of Breath     Brief history of present illness from the day of admission and additional interim summary    Tyrone R Murdockis a 75 y.o.malewith medical history significant forcoronary artery disease, essential hypertension, hyperlipidemia, rheumatoid arthritis, BPH, COPD not on chronic oxygen, recentlytreated COVID-19 viral pneumonia superimposed by bacterial pneumonia who presented to [facility] from SNF due to shortness of breath, fever with T-max 102 ??, tachycardia with heart rate in the 120s, altered mental status, and acutehypoxia with O2 saturation 86% on room air, of note he has had two treatments for COVID-19 pneumonia in the last couple of months. In the ER work-up was unremarkable with negative CT, inconclusive chest imaging, UA was clear and he was admitted to the hospital for further care for febrile illness and encephalopathy.                                                                  Hospital Course   1. ? Febrile illness with hypoxic respiratory failure in a patient with two  recent COVID-19 infection admissions - he has been treated with remdesivir recently, I do not think this present illness is due to active COVID-19 infection.  No clear source of active infection was identified, negative urine and blood cultures, could have had mild aspiration pneumonia but inconclusive, he was empirically treated with low-dose steroids and Unasyn, made afebrile and now close to his baseline, no fevers since he is in the hospital, seen by speech therapy, stable on soft diet, will be discharged on oral Augmentin for three more days at SNF, note his delirium can get worse with change of place.  2. Deconditioning, possible early dementia. Patient has had two recent admissions to the hospital and was recently discharged to SNF on the very day of this admission. Supportive care with PT OT. At risk for delirium, minimize narcotics and benzodiazepines.   3. Metabolic encephalopathy.  Likely also has undiagnosed early dementia, head CT negative, no focal deficits, mentation has much improved and now close to his baseline with minimal confusion.  4. Chronic diastolic CHF recent EF 55 to 60%. Continue low-dose beta-blocker and monitor  5. BPH. On Flomax.  6. Dyslipidemia. On statin continue.  7. History of Rheumatoid arthritis. Takes Rituxan every 4 months.  8. HTN - stable on present dose beta-blocker.Marland Kitchen     Discharge diagnosis     Active Problems:   Acute hypoxemic respiratory failure due to COVID-19 [facility])    Discharge instructions    Discharge Instructions    Discharge instructions   Complete by: As directed    Follow with Primary MD [name], MD in 7 days   Get CBC, CMP, 2 view Chest X ray -  checked next visit within 1 week by Primary MD or SNF MD   Activity: As tolerated with Full fall precautions use walker/cane & assistance as needed  Disposition Home    Diet: Soft with feeding assistance and aspiration precautions.   Special Instructions: If you  have smoked or chewed Tobacco  in the last 2 yrs please stop smoking, stop any regular Alcohol  and or any Recreational drug use.  On your next visit with your primary care physician please Get Medicines reviewed and adjusted.  Please request your Prim.MD to go over all Hospital Tests and Procedure/Radiological results at the follow up, please get all Hospital records sent to your Prim MD by signing hospital release before you go home.  If you experience worsening of your admission symptoms, develop shortness of breath, life threatening emergency, suicidal or homicidal thoughts you must seek medical attention immediately by calling 911 or calling your MD immediately  if symptoms less severe.  You Must read complete instructions/literature along with all the possible adverse reactions/side effects for all the Medicines you take and that have been prescribed to you. Take any new Medicines after you have completely understood and accpet all the possible adverse reactions/side effects.   Increase activity slowly   Complete by: As directed    No wound care   Complete by: As directed       Discharge Medications   Allergies as of 04/06/2020      Reactions   Cephalexin Shortness Of Breath, Rash, Other (See Comments)   Tolerated Augmentin 2015 and 2017 (12-2017). Tolerated nafcillin 12/2017 PATIENT HAS HAD A PCN REACTION WITH IMMEDIATE RASH, FACIAL/TONGUE/THROAT SWELLING, SOB, OR LIGHTHEADEDNESS WITH HYPOTENSION:  #  #  YES  #  #  Has patient had a PCN reaction causing severe rash involving mucus membranes or skin necrosis: No Has patient had a PCN reaction that required hospitalization: No Has patient had a PCN reaction occurring within the last 10 years: No If all of the above answers are "NO", then may proceed   Clarithromycin Shortness Of Breath, Rash   Certolizumab Pegol Hives   Tocilizumab Hives   Doxycycline Rash   Hydroxyzine Rash   Lidoderm Other (See Comments)   "made me act weird"    Pyrithione Zinc Rash      Medication List    STOP taking these medications   metoprolol succinate 25 MG 24 hr tablet Commonly known as: TOPROL-XL   predniSONE 10 MG tablet Commonly known as: DELTASONE   zolpidem 10 MG tablet Commonly known as: AMBIEN     TAKE these medications   (feeding supplement) PROSource Plus liquid Take 30 mLs by mouth 2 (two) times  daily between meals.   feeding supplement Liqd Take 237 mLs by mouth 2 (two) times daily between meals.   acetaminophen 325 MG tablet Commonly known as: TYLENOL Take 650 mg by mouth every 6 (six) hours as needed for headache, mild pain or fever.   albuterol 108 (90 Base) MCG/ACT inhaler Commonly known as: Ventolin HFA Inhale 2 puffs into the lungs every 4 (four) hours as needed for wheezing or shortness of breath.   amoxicillin-clavulanate 875-125 MG tablet Commonly known as: Augmentin Take 1 tablet by mouth 2 (two) times daily for 3 days.   aspirin 81 MG chewable tablet Chew 1 tablet (81 mg total) by mouth daily. Start taking on: April 07, 2020   benzonatate 100 MG capsule Commonly known as: TESSALON Take 1 capsule (100 mg total) by mouth 3 (three) times daily.   metoprolol tartrate 100 MG tablet Commonly known as: LOPRESSOR Take 1 tablet (100 mg total) by mouth 2 (two) times daily.   mometasone-formoterol 200-5 MCG/ACT Aero Commonly known as: DULERA Inhale 2 puffs into the lungs 2 (two) times daily.   nitroGLYCERIN 0.4 MG SL tablet Commonly known as: NITROSTAT Place 1 tablet (0.4 mg total) under the tongue every 5 (five) minutes as needed for chest pain (3 doses max). What changed: reasons to take this   omeprazole 40 MG capsule Commonly known as: PRILOSEC Take 1 capsule (40 mg total) by mouth daily.   simvastatin 40 MG tablet Commonly known as: ZOCOR TAKE ONE TABLET BY MOUTH DAILY What changed: when to take this   tamsulosin 0.4 MG Caps capsule Commonly known as: FLOMAX Take 2 capsules (0.8 mg  total) by mouth daily.        Contact information for follow-up providers    [name], MD. Schedule an appointment as soon as possible for a visit in 1 week(s).   Specialty: Family Medicine Contact information: [place] [address] [phone]            Contact information for after-discharge care    Destination    [facility] .   Service: Skilled Nursing Contact information: [place] [phone]                  Major procedures and Radiology Reports - PLEASE review detailed and final reports thoroughly  -       DG Chest 2 View  Result Date: 03/16/2020 CLINICAL DATA:  Chest pain, COVID-19 positive. EXAM: CHEST - 2 VIEW COMPARISON:  February 29, 2020. FINDINGS: The heart size and mediastinal contours are within normal limits. No pneumothorax or pleural effusion is noted. Multiple patchy airspace opacities are noted bilaterally consistent with multifocal pneumonia. The visualized skeletal structures are unremarkable. IMPRESSION: Bilateral multifocal pneumonia. Electronically Signed   By: Marijo Conception M.D.   On: 03/16/2020 13:58   CT Head Wo Contrast  Result Date: 04/02/2020 CLINICAL DATA:  Altered mental status, sepsis, hypoxia, fever EXAM: CT HEAD WITHOUT CONTRAST CT CHEST, ABDOMEN AND PELVIS WITH CONTRAST TECHNIQUE: Contiguous axial images were obtained from the base of the skull through the vertex without intravenous contrast. Multiplanar CT image reconstructions were also generated. Multidetector CT imaging of the chest, abdomen and pelvis was performed following the standard protocol during bolus administration of intravenous contrast. CONTRAST:  112mL OMNIPAQUE IOHEXOL 300 MG/ML  SOLN COMPARISON:  CT head 03/30/2020, CT chest 03/26/2020, CT abdomen pelvis 10/07/2017 FINDINGS: CT HEAD FINDINGS Brain: Normal anatomic configuration of  the brain. No evidence of acute intracranial  hemorrhage or infarct. Mild parenchymal volume loss is commensurate with the patient's age. Mild periventricular white matter changes are present likely reflecting the sequela of small vessel ischemia. No abnormal intra or extra-axial mass lesion. Left posterior fossa parasagittal arachnoid cyst is unchanged. Ventricular size is normal. Cerebellum is unremarkable. Vascular: No asymmetric hyperdense vasculature at the skull base. Skull: Intact Sinuses/Orbits: The paranasal sinuses are clear. The orbits are unremarkable. Other: Mastoid air cells and middle ear cavities are clear. CT CHEST FINDINGS Cardiovascular: Extensive coronary artery calcification predominantly within the a left anterior descending coronary artery. Global cardiac size within normal limits. Mild left ventricular hypertrophy noted, however. No pericardial effusion. The central pulmonary arteries are of normal caliber. Minimal atherosclerotic calcification is seen within the thoracic aorta. No aortic aneurysm. Mediastinum/Nodes: No pathologic thoracic adenopathy. Esophagus unremarkable. Thyroid unremarkable peer Lungs/Pleura: Diffuse bilateral interstitial and ground-glass pulmonary infiltrate is again noted and has largely improved in the interval since prior examination. Evaluation of the pulmonary parenchyma is slightly limited by motion artifact. There is, however, progressive volume loss identified with increasing reticulation at the lung bases possibly representing progressive atelectasis or developing inflammatory change. No pneumothorax or pleural effusion. Central airways are patent. Musculoskeletal: No acute bone abnormality. No lytic or blastic bone lesion. CT ABDOMEN PELVIS FINDINGS Hepatobiliary: No focal liver abnormality is seen. No gallstones, gallbladder wall thickening, or biliary dilatation. Pancreas: Unremarkable Spleen: Unremarkable Adrenals/Urinary Tract: The adrenal glands are unremarkable. The kidneys are normal in size and  position. Multiple punctate nonobstructing calculi are noted within the kidneys bilaterally. Several larger calculi measuring up to 10 mm are noted within the lower pole of the left kidney adjacent to a caliceal diverticulum. Simple cortical cyst noted within the interpolar region of the right kidney. The kidneys are otherwise unremarkable. No hydronephrosis. No ureteral calculi. The bladder is unremarkable. Stomach/Bowel: Severe sigmoid diverticulosis. The stomach, small bowel, and large bowel are otherwise unremarkable. Appendix normal. No free intraperitoneal gas or fluid. Vascular/Lymphatic: Mild aortoiliac atherosclerotic calcification. No aortic aneurysm. No pathologic adenopathy within the abdomen and pelvis. Reproductive: Prostate is unremarkable. Other: No abdominal wall hernia.  The rectum is unremarkable. Musculoskeletal: No acute bone abnormality. There has developed acquired fusion of the a L1 and L2 vertebral bodies with a mild resultant focal kyphotic deformity at this level. No superimposed osseous erosion noted. Right L2 hemilaminectomy has been performed. No lytic or blastic bone lesions. IMPRESSION: No acute intracranial abnormality. Diffuse interstitial and ground-glass pulmonary infiltrate has overall improved since prior examination, though there is progressive volume loss and bibasilar increasing reticulation, possibly representing atelectasis or progressive inflammatory change in this region. No central obstructing mass. No acute intra-abdominal pathology identified. Bilateral nonobstructing nephrolithiasis. Severe sigmoid diverticulosis. Postsurgical changes within the lumbar spine with acquired fusion of L1-2. Aortic Atherosclerosis (ICD10-I70.0). Electronically Signed   By: Fidela Salisbury MD   On: 04/02/2020 23:48   CT HEAD WO CONTRAST  Result Date: 03/30/2020 CLINICAL DATA:  Mental status changes of unknown etiology. Coronavirus pneumonia. EXAM: CT HEAD WITHOUT CONTRAST TECHNIQUE:  Contiguous axial images were obtained from the base of the skull through the vertex without intravenous contrast. COMPARISON:  MRI 12/13/2017 FINDINGS: Brain: No change since prior appearances. Mild age related volume loss. No sign of acute infarction, mass lesion, hemorrhage, hydrocephalus or extra-axial collection. Incidental mega cisterna magna. Vascular: There is atherosclerotic calcification of the major vessels at the base of the brain. Skull: Negative Sinuses/Orbits: Clear/normal Other: None IMPRESSION: No  acute or reversible finding. Mild age related volume loss. Atherosclerotic calcification of the major vessels at the base of the brain. Electronically Signed   By: Nelson Chimes M.D.   On: 03/30/2020 19:15   CT ANGIO CHEST PE W OR WO CONTRAST  Result Date: 03/26/2020 CLINICAL DATA:  Shortness of breath.  COVID positive. EXAM: CT ANGIOGRAPHY CHEST WITH CONTRAST TECHNIQUE: Multidetector CT imaging of the chest was performed using the standard protocol during bolus administration of intravenous contrast. Multiplanar CT image reconstructions and MIPs were obtained to evaluate the vascular anatomy. CONTRAST:  178mL OMNIPAQUE IOHEXOL 350 MG/ML SOLN COMPARISON:  03/16/2020 FINDINGS: Cardiovascular: Examination is limited by respiratory motion artifact. Within that limitation, there is no central pulmonary embolus. The size of the main pulmonary artery is normal. Normal heart size with coronary artery calcification. The course and caliber of the aorta are normal. There is mild atherosclerotic calcification. Opacification decreased due to pulmonary arterial phase contrast bolus timing. Mediastinum/Nodes: -- No mediastinal lymphadenopathy. -- No hilar lymphadenopathy. -- No axillary lymphadenopathy. -- No supraclavicular lymphadenopathy. -- Normal thyroid gland where visualized. -  Unremarkable esophagus. Lungs/Pleura: There are trace bilateral pleural effusions. There are diffuse bilateral ground-glass airspace  opacities. Emphysematous changes are suspected at the lung apices. There are pleural based calcified and noncalcified plaques. Overall, the airspace disease has worsened since the prior study. There is no pneumothorax. The trachea is unremarkable. There are trace bilateral pleural effusions. Upper Abdomen: Contrast bolus timing is not optimized for evaluation of the abdominal organs. The visualized portions of the organs of the upper abdomen are normal. Musculoskeletal: No chest wall abnormality. No bony spinal canal stenosis. Review of the MIP images confirms the above findings. IMPRESSION: 1. Examination is limited by respiratory motion artifact. Within that limitation, there is no central pulmonary embolus. 2. Worsening bilateral ground-glass airspace opacities, consistent with the patient's history of viral pneumonia. 3. Trace bilateral pleural effusions. 4. Calcified and noncalcified pleural plaques, consistent with prior asbestos exposure. Aortic Atherosclerosis (ICD10-I70.0) and Emphysema (ICD10-J43.9). Electronically Signed   By: Constance Holster M.D.   On: 03/26/2020 17:23   CT Angio Chest PE W and/or Wo Contrast  Result Date: 03/16/2020 CLINICAL DATA:  COVID, chest pain.  Concern for PE. EXAM: CT ANGIOGRAPHY CHEST WITH CONTRAST TECHNIQUE: Multidetector CT imaging of the chest was performed using the standard protocol during bolus administration of intravenous contrast. Multiplanar CT image reconstructions and MIPs were obtained to evaluate the vascular anatomy. CONTRAST:  38mL OMNIPAQUE IOHEXOL 300 MG/ML  SOLN COMPARISON:  11/27/2010 FINDINGS: Cardiovascular: No filling defects in the pulmonary arteries to suggest pulmonary emboli. Heart is normal size. Aorta is normal caliber. Coronary artery and aortic calcifications. Mediastinum/Nodes: No mediastinal, hilar, or axillary adenopathy. Trachea and esophagus are unremarkable. Thyroid unremarkable. Lungs/Pleura: Patchy peripheral bilateral airspace  opacities, favor related to COVID pneumonia. Mild centrilobular emphysema. No effusions. Upper Abdomen: Insert for abdomen Musculoskeletal: Chest wall soft tissues are unremarkable. No acute bony abnormality. Review of the MIP images confirms the above findings. IMPRESSION: No evidence of pulmonary embolus. Patchy bilateral peripheral airspace opacities most compatible with COVID pneumonia. Coronary artery disease. Aortic Atherosclerosis (ICD10-I70.0) and Emphysema (ICD10-J43.9). Electronically Signed   By: Rolm Baptise M.D.   On: 03/16/2020 20:49   CT CHEST ABDOMEN PELVIS W CONTRAST  Result Date: 04/02/2020 CLINICAL DATA:  Altered mental status, sepsis, hypoxia, fever EXAM: CT HEAD WITHOUT CONTRAST CT CHEST, ABDOMEN AND PELVIS WITH CONTRAST TECHNIQUE: Contiguous axial images were obtained from the base of the skull  through the vertex without intravenous contrast. Multiplanar CT image reconstructions were also generated. Multidetector CT imaging of the chest, abdomen and pelvis was performed following the standard protocol during bolus administration of intravenous contrast. CONTRAST:  111mL OMNIPAQUE IOHEXOL 300 MG/ML  SOLN COMPARISON:  CT head 03/30/2020, CT chest 03/26/2020, CT abdomen pelvis 10/07/2017 FINDINGS: CT HEAD FINDINGS Brain: Normal anatomic configuration of the brain. No evidence of acute intracranial hemorrhage or infarct. Mild parenchymal volume loss is commensurate with the patient's age. Mild periventricular white matter changes are present likely reflecting the sequela of small vessel ischemia. No abnormal intra or extra-axial mass lesion. Left posterior fossa parasagittal arachnoid cyst is unchanged. Ventricular size is normal. Cerebellum is unremarkable. Vascular: No asymmetric hyperdense vasculature at the skull base. Skull: Intact Sinuses/Orbits: The paranasal sinuses are clear. The orbits are unremarkable. Other: Mastoid air cells and middle ear cavities are clear. CT CHEST FINDINGS  Cardiovascular: Extensive coronary artery calcification predominantly within the a left anterior descending coronary artery. Global cardiac size within normal limits. Mild left ventricular hypertrophy noted, however. No pericardial effusion. The central pulmonary arteries are of normal caliber. Minimal atherosclerotic calcification is seen within the thoracic aorta. No aortic aneurysm. Mediastinum/Nodes: No pathologic thoracic adenopathy. Esophagus unremarkable. Thyroid unremarkable peer Lungs/Pleura: Diffuse bilateral interstitial and ground-glass pulmonary infiltrate is again noted and has largely improved in the interval since prior examination. Evaluation of the pulmonary parenchyma is slightly limited by motion artifact. There is, however, progressive volume loss identified with increasing reticulation at the lung bases possibly representing progressive atelectasis or developing inflammatory change. No pneumothorax or pleural effusion. Central airways are patent. Musculoskeletal: No acute bone abnormality. No lytic or blastic bone lesion. CT ABDOMEN PELVIS FINDINGS Hepatobiliary: No focal liver abnormality is seen. No gallstones, gallbladder wall thickening, or biliary dilatation. Pancreas: Unremarkable Spleen: Unremarkable Adrenals/Urinary Tract: The adrenal glands are unremarkable. The kidneys are normal in size and position. Multiple punctate nonobstructing calculi are noted within the kidneys bilaterally. Several larger calculi measuring up to 10 mm are noted within the lower pole of the left kidney adjacent to a caliceal diverticulum. Simple cortical cyst noted within the interpolar region of the right kidney. The kidneys are otherwise unremarkable. No hydronephrosis. No ureteral calculi. The bladder is unremarkable. Stomach/Bowel: Severe sigmoid diverticulosis. The stomach, small bowel, and large bowel are otherwise unremarkable. Appendix normal. No free intraperitoneal gas or fluid. Vascular/Lymphatic:  Mild aortoiliac atherosclerotic calcification. No aortic aneurysm. No pathologic adenopathy within the abdomen and pelvis. Reproductive: Prostate is unremarkable. Other: No abdominal wall hernia.  The rectum is unremarkable. Musculoskeletal: No acute bone abnormality. There has developed acquired fusion of the a L1 and L2 vertebral bodies with a mild resultant focal kyphotic deformity at this level. No superimposed osseous erosion noted. Right L2 hemilaminectomy has been performed. No lytic or blastic bone lesions. IMPRESSION: No acute intracranial abnormality. Diffuse interstitial and ground-glass pulmonary infiltrate has overall improved since prior examination, though there is progressive volume loss and bibasilar increasing reticulation, possibly representing atelectasis or progressive inflammatory change in this region. No central obstructing mass. No acute intra-abdominal pathology identified. Bilateral nonobstructing nephrolithiasis. Severe sigmoid diverticulosis. Postsurgical changes within the lumbar spine with acquired fusion of L1-2. Aortic Atherosclerosis (ICD10-I70.0). Electronically Signed   By: Fidela Salisbury MD   On: 04/02/2020 23:48   DG Chest Port 1 View  Result Date: 04/04/2020 CLINICAL DATA:  Shortness of breath, COVID. EXAM: PORTABLE CHEST 1 VIEW COMPARISON:  04/03/2020 and CT chest 04/02/2020. FINDINGS: Patient is slightly rotated. Trachea  is midline. Heart size stable. Worsening patchy bilateral airspace opacification with mild coarsening. No definite pleural fluid. IMPRESSION: Worsening COVID-19 pneumonia. Suspect developing pulmonary fibrosis. Electronically Signed   By: Lorin Picket M.D.   On: 04/04/2020 08:10   DG Chest Port 1 View  Result Date: 04/03/2020 CLINICAL DATA:  75 year old male with history of COVID infection complicated by superimposed bacterial pneumonia. EXAM: PORTABLE CHEST 1 VIEW COMPARISON:  Chest x-ray 04/03/2020. FINDINGS: Lung volumes are slightly low.  Persistent ill-defined opacities and widespread areas of interstitial prominence noted in the lungs bilaterally (right greater than left), most confluent in the right mid to lower lung. No pleural effusions. No pneumothorax. No evidence of pulmonary edema. Heart size appears borderline enlarged. The patient is rotated to the right on today's exam, resulting in distortion of the mediastinal contours and reduced diagnostic sensitivity and specificity for mediastinal pathology. IMPRESSION: 1. Multilobar bilateral pneumonia appears similar to recent prior studies, as above. Electronically Signed   By: Vinnie Langton M.D.   On: 04/03/2020 07:53   DG Chest Port 1 View  Result Date: 04/03/2020 CLINICAL DATA:  COVID positive EXAM: PORTABLE CHEST 1 VIEW COMPARISON:  CT chest April 02, 2020 FINDINGS: The heart size and mediastinal contours are mildly enlarged. Hazy ground-glass interstitial airspace opacities bilaterally are again seen throughout both lungs. There is prominence of the central pulmonary vasculature. No acute osseous abnormality. IMPRESSION: Hazy ground-glass airspace opacities seen throughout both lungs, unchanged is seen prior CT of April 02, 2020, likely consistent with multifocal pneumonia Electronically Signed   By: Prudencio Pair M.D.   On: 04/03/2020 00:45   DG Chest Port 1 View  Result Date: 03/30/2020 CLINICAL DATA:  Fever.  History of COPD. EXAM: PORTABLE CHEST 1 VIEW COMPARISON:  CT chest 03/26/2020. Single-view of the chest 03/25/2020. FINDINGS: Hazy multifocal airspace disease has worsened since the prior plain film. Heart size is normal. No pneumothorax or pleural effusion. Emphysema again noted. No acute or focal bony abnormality. IMPRESSION: Worsened multifocal pneumonia. Electronically Signed   By: Inge Rise M.D.   On: 03/30/2020 14:11   DG Chest Port 1 View  Result Date: 03/25/2020 CLINICAL DATA:  Possible sepsis.  Shortness of breath and confusion. EXAM: PORTABLE  CHEST 1 VIEW COMPARISON:  PA and lateral chest and CT chest 03/16/2020. FINDINGS: Patchy bilateral airspace disease persists and appears slightly worse on the left. No pneumothorax or pleural fluid. Heart size is normal. No acute or focal bony abnormality. IMPRESSION: Patchy bilateral airspace disease consistent with pneumonia appears slightly worse on the left compared to the prior exam Electronically Signed   By: Inge Rise M.D.   On: 03/25/2020 14:58   VAS [place] LOWER EXTREMITY VENOUS (DVT)  Result Date: 03/31/2020  Lower Venous DVT Study Indications: Elevated Ddimer.  Risk Factors: COVID 19 positive. Comparison Study: No prior studies. Performing Technologist: Oliver Hum RVT  Examination Guidelines: A complete evaluation includes B-mode imaging, spectral Doppler, color Doppler, and power Doppler as needed of all accessible portions of each vessel. Bilateral testing is considered an integral part of a complete examination. Limited examinations for reoccurring indications may be performed as noted. The reflux portion of the exam is performed with the patient in reverse Trendelenburg.  +---------+---------------+---------+-----------+----------+--------------+ RIGHT    CompressibilityPhasicitySpontaneityPropertiesThrombus Aging +---------+---------------+---------+-----------+----------+--------------+ CFV      Full           Yes      Yes                                 +---------+---------------+---------+-----------+----------+--------------+  SFJ      Full                                                        +---------+---------------+---------+-----------+----------+--------------+ FV Prox  Full                                                        +---------+---------------+---------+-----------+----------+--------------+ FV Mid   Full                                                         +---------+---------------+---------+-----------+----------+--------------+ FV DistalFull                                                        +---------+---------------+---------+-----------+----------+--------------+ PFV      Full                                                        +---------+---------------+---------+-----------+----------+--------------+ POP      Full           Yes      Yes                                 +---------+---------------+---------+-----------+----------+--------------+ PTV      Full                                                        +---------+---------------+---------+-----------+----------+--------------+ PERO     Full                                                        +---------+---------------+---------+-----------+----------+--------------+   +---------+---------------+---------+-----------+----------+--------------+ LEFT     CompressibilityPhasicitySpontaneityPropertiesThrombus Aging +---------+---------------+---------+-----------+----------+--------------+ CFV      Full           Yes      Yes                                 +---------+---------------+---------+-----------+----------+--------------+ SFJ      Full                                                        +---------+---------------+---------+-----------+----------+--------------+  FV Prox  Full                                                        +---------+---------------+---------+-----------+----------+--------------+ FV Mid   Full                                                        +---------+---------------+---------+-----------+----------+--------------+ FV DistalFull                                                        +---------+---------------+---------+-----------+----------+--------------+ PFV      Full                                                         +---------+---------------+---------+-----------+----------+--------------+ POP      Full           Yes      Yes                                 +---------+---------------+---------+-----------+----------+--------------+ PTV      Full                                                        +---------+---------------+---------+-----------+----------+--------------+ PERO     Full                                                        +---------+---------------+---------+-----------+----------+--------------+     Summary: RIGHT: - There is no evidence of deep vein thrombosis in the lower extremity.  - No cystic structure found in the popliteal fossa.  LEFT: - There is no evidence of deep vein thrombosis in the lower extremity.  - No cystic structure found in the popliteal fossa.  *See table(s) above for measurements and observations. Electronically signed by Jamelle Haring on 03/31/2020 at 8:10:57 PM.    Final    ECHOCARDIOGRAM LIMITED  Result Date: 04/03/2020    ECHOCARDIOGRAM LIMITED REPORT   Patient Name:   JEMAINE PROKOP Date of Exam: 04/03/2020 Medical Rec #:  [id number]      Height:       74.0 in Accession #:    [id number]     Weight:       208.1 lb Date of Birth:  1945-04-08      BSA:          2.211 m Patient Age:    75 years  BP:           133/94 mmHg Patient Gender: M              HR:           106 bpm. Exam Location:  Inpatient Procedure: Limited Echo, Limited Color Doppler and Cardiac Doppler Indications:    Elevated Troponin  History:        Patient has prior history of Echocardiogram examinations, most                 recent 12/13/2017. CAD and Previous Myocardial Infarction, COPD;                 Risk Factors:Hypertension and Dyslipidemia.  Sonographer:    Mikki Santee RDCS (AE) Referring Phys: [id number] Kayleen Memos  Sonographer Comments: Technically difficult study due to poor echo windows. IMPRESSIONS  1. Left ventricular ejection fraction, by estimation, is 60 to 65%. The  left ventricle has no regional wall motion abnormalities. Left ventricular diastolic parameters are indeterminate.  2. Right ventricular systolic function is normal. The right ventricular size is normal.  3. Limited echo to evaluate LV function. FINDINGS  Left Ventricle: Left ventricular ejection fraction, by estimation, is 60 to 65%. The left ventricle has no regional wall motion abnormalities. Definity contrast agent was given IV to delineate the left ventricular endocardial borders. The left ventricular internal cavity size was normal in size. Left ventricular diastolic parameters are indeterminate. Right Ventricle: The right ventricular size is normal. Right vetricular wall thickness was not assessed. Right ventricular systolic function is normal. Carlyle Dolly MD Electronically signed by Carlyle Dolly MD Signature Date/Time: 04/03/2020/12:26:10 PM    Final     Micro Results     Recent Results (from the past 240 hour(s))  Culture, blood (routine x 2)     Status: None   Collection Time: 03/27/20  9:23 AM   Specimen: BLOOD RIGHT HAND  Result Value Ref Range Status   Specimen Description   Final    BLOOD RIGHT HAND Performed at [facility], [address], [place], [place] [address]    Special Requests   Final    BOTTLES DRAWN AEROBIC ONLY Blood Culture results may not be optimal due to an inadequate volume of blood received in culture bottles Performed at [facility] [address], [place], [place] [address]    Culture   Final    NO GROWTH 5 DAYS Performed at [facility], [place] [address], [place], [place] [address]    Report Status 04/01/2020 FINAL  Final  Culture, blood (routine x 2)     Status: None   Collection Time: 03/27/20  9:23 AM   Specimen: BLOOD LEFT HAND  Result Value Ref Range Status   Specimen Description   Final    BLOOD LEFT HAND Performed at [facility], [place] [address] [place], [place] [address]    Special Requests    Final    BOTTLES DRAWN AEROBIC AND ANAEROBIC Blood Culture results may not be optimal due to an inadequate volume of blood received in culture bottles Performed at [place] [address], [place], [place] [address]    Culture   Final    NO GROWTH 5 DAYS Performed at [facility], [place] [address] [place], [place] [address]    Report Status 04/01/2020 FINAL  Final  Culture, blood (routine x 2)     Status: None  Collection Time: 03/30/20  1:29 PM   Specimen: BLOOD RIGHT HAND  Result Value Ref Range Status   Specimen Description   Final    BLOOD RIGHT HAND Performed at [facility], [place] [address], [place], [place] [address]    Special Requests   Final    BOTTLES DRAWN AEROBIC ONLY Blood Culture results may not be optimal due to an inadequate volume of blood received in culture bottles Performed at [place] [address] [place], [place] [address]    Culture   Final    NO GROWTH 5 DAYS Performed at [facility], [place] [address], [place], [place] [address]    Report Status 04/04/2020 FINAL  Final  Culture, blood (routine x 2)     Status: None   Collection Time: 03/30/20  1:29 PM   Specimen: BLOOD RIGHT HAND  Result Value Ref Range Status   Specimen Description   Final    BLOOD RIGHT HAND Performed at [place] [address] [place], [place] [address]    Special Requests   Final    BOTTLES DRAWN AEROBIC AND ANAEROBIC Blood Culture adequate volume Performed at [facility] [address] [place], [place] [address]    Culture   Final    NO GROWTH 5 DAYS Performed at [facility], [place] [address] [place], [place] [address]    Report Status 04/04/2020 FINAL  Final  Culture, Urine     Status: None   Collection Time: 03/30/20  5:04 PM   Specimen: Urine, Random  Result Value Ref Range Status   Specimen Description   Final    URINE, RANDOM Performed  at [facility] [address], [place], [place] [address]    Special Requests   Final    NONE Performed at [facility], [place] [address] [place], [place] [address]    Culture   Final    NO GROWTH Performed at [facility], [place] [address] [place], [place] [address]    Report Status 04/01/2020 FINAL  Final  Blood Culture (routine x 2)     Status: None (Preliminary result)   Collection Time: 04/02/20  9:12 PM   Specimen: BLOOD  Result Value Ref Range Status   Specimen Description BLOOD RIGHT HAND  Final   Special Requests   Final    BOTTLES DRAWN AEROBIC AND ANAEROBIC Blood Culture results may not be optimal due to an excessive volume of blood received in culture bottles   Culture   Final    NO GROWTH 3 DAYS Performed at [facility], [place] [address] [place] [address], [place], [place] [address]    Report Status PENDING  Incomplete  Blood Culture (routine x 2)     Status: None (Preliminary result)   Collection Time: 04/02/20  9:31 PM   Specimen: BLOOD  Result Value Ref Range Status   Specimen Description BLOOD LEFT WRIST  Final   Special Requests   Final    BOTTLES DRAWN AEROBIC AND ANAEROBIC Blood Culture results may not be optimal due to an inadequate volume of blood received in culture bottles   Culture   Final    NO GROWTH 3 DAYS Performed at [facility], [place] [address] [place],  [address]    Report Status PENDING  Incomplete  Urine culture     Status: None   Collection Time: 04/03/20 12:17 AM  Specimen: In/Out Cath Urine  Result Value Ref Range Status   Specimen Description IN/OUT CATH URINE  Final   Special Requests NONE  Final   Culture   Final    NO GROWTH Performed at [facility], [address], [place], [place] [address]    Report Status 04/04/2020 FINAL  Final    Today   Subjective    Breeze Angell today has no headache,no chest abdominal pain,no new weakness tingling or numbness, feels much better     Objective   Blood pressure 131/82, pulse 70, temperature 97.8 F (36.6 C), temperature source Oral, resp. rate 18, height 6\' 2"  (1.88 m), weight 89.9 kg, SpO2 95 %.   Intake/Output Summary (Last 24 hours) at 04/06/2020 0919 Last data filed at 04/06/2020 0200 Gross per 24 hour  Intake 840 ml  Output 900 ml  Net -60 ml    Exam  Awake, minimally confused, No new F.N deficits,   [place].AT,PERRAL Supple Neck,No JVD, No cervical lymphadenopathy appriciated.  Symmetrical Chest wall movement, Good air movement bilaterally, CTAB RRR,No Gallops,Rubs or new Murmurs, No Parasternal Heave +ve B.Sounds, Abd Soft, Non tender, No organomegaly appriciated, No rebound -guarding or rigidity. No Cyanosis, Clubbing or edema, No new Rash or bruise   Data Review   CBC w Diff:  Lab Results  Component Value Date   WBC 5.4 04/05/2020   HGB 10.5 (L) 04/05/2020   HCT 30.5 (L) 04/05/2020   PLT 231 04/05/2020   LYMPHOPCT 7 04/05/2020   MONOPCT 6 04/05/2020   EOSPCT 0 04/05/2020   BASOPCT 1 04/05/2020    CMP:  Lab Results  Component Value Date   NA 134 (L) 04/05/2020   K 4.4 04/05/2020   CL 104 04/05/2020   CO2 21 (L) 04/05/2020   BUN 23 04/05/2020   CREATININE 1.20 04/05/2020   CREATININE 1.64 (H) 02/18/2018   PROT 4.7 (L) 04/05/2020   ALBUMIN 1.8 (L) 04/05/2020   BILITOT 0.8 04/05/2020   ALKPHOS 63 04/05/2020   AST 37 04/05/2020   ALT 40 04/05/2020  .   Total Time in preparing paper work, data evaluation and todays exam - 57 minutes  Lala Lund M.D on 04/06/2020 at 9:19 AM  [facility]

## 2020-04-06 NOTE — Progress Notes (Signed)
DISCHARGE NOTE SNF Tyrone Roman to be discharged to [facility] per MD order. Patient verbalized understanding.  Skin clean, dry and intact without evidence of skin break down, no evidence of skin tears noted. IV catheter discontinued intact. Site without signs and symptoms of complications. Dressing and pressure applied. Pt denies pain at the site currently. No complaints noted.  Patient free of lines, drains, and wounds.   Discharge packet assembled. An After Visit Summary (AVS) was printed and given to the EMS personnel. Patient escorted via stretcher and discharged to [facility] via ambulance. Report called to accepting facility; all questions and concerns addressed.   Mikki Santee, RN

## 2020-04-06 NOTE — Care Management Important Message (Signed)
Important Message  Patient Details  Name: Tyrone Roman MRN: [id number] Date of Birth: 1945-02-06   Medicare Important Message Given:  Yes     [name] 04/06/2020, 3:30 PM

## 2020-04-06 NOTE — Progress Notes (Signed)
Physical Therapy Treatment Patient Details Name: Tyrone Roman MRN: [id number] DOB: 20-Mar-1945 Today's Date: 04/06/2020    History of Present Illness Pt is a 75 y.o. male recently d/c to [facility] for short-term rehab, now admitted 04/02/20 with SOB, fever, AMS with concern for sepsis. Workup for metabolic encephalopathy, acute hypoxic respiratory failure secondary to recently treated COVID-19 PNA superimposed by bacterial PNA. PMH includes RA, CAD, COPD, HTN, COVID dx (02/29/20).    PT Comments    Patient received up in recliner, very pleasant and cooperative and able to wash/dry BLEs after having had an accident in the chair. Able to mobilize on a min guard basis with RW and cues for hand placement/safety today, VSS on RA but easily fatigued. Left up in recliner with all needs met, spouse present. Would continue to benefit from SNF for ongoing rehab moving forward.     Follow Up Recommendations  SNF;Supervision for mobility/OOB     Equipment Recommendations   (defer)    Recommendations for Other Services       Precautions / Restrictions Precautions Precautions: Fall Restrictions Weight Bearing Restrictions: No    Mobility  Bed Mobility               General bed mobility comments: up in recliner upon entry    Transfers Overall transfer level: Needs assistance Equipment used: Rolling walker (2 wheeled) Transfers: Sit to/from Stand Sit to Stand: Min guard         General transfer comment: min guard for safety, cues for hand placement, no physical assist given  Ambulation/Gait Ambulation/Gait assistance: Min guard Gait Distance (Feet): 140 Feet Assistive device: Rolling walker (2 wheeled) Gait Pattern/deviations: Step-through pattern;Trunk flexed;Decreased step length - right;Decreased step length - left Gait velocity: decr   General Gait Details: tolerated well today, slow but steady wtih RW, VSS on RA with activity   Stairs              Wheelchair Mobility    Modified Rankin (Stroke Patients Only)       Balance Overall balance assessment: Needs assistance Sitting-balance support: Feet supported Sitting balance-Leahy Scale: Good Sitting balance - Comments: able to don paper scrub pants with minimal back support in recliner   Standing balance support: During functional activity;Bilateral upper extremity supported Standing balance-Leahy Scale: Fair Standing balance comment: reliant on external support                            Cognition Arousal/Alertness: Awake/alert Behavior During Therapy: WFL for tasks assessed/performed Overall Cognitive Status: Impaired/Different from baseline                     Current Attention Level: Selective Memory: Decreased recall of precautions;Decreased short-term memory Following Commands: Follows one step commands with increased time;Follows multi-step commands with increased time Safety/Judgement: Decreased awareness of safety;Decreased awareness of deficits Awareness: Emergent Problem Solving: Slow processing;Decreased initiation;Difficulty sequencing;Requires verbal cues General Comments: still with slow processing but much less confused today, attention span also much better. Still seems slightly confused, but overall awareness and safety improved.      Exercises      General Comments General comments (skin integrity, edema, etc.): VSS on RA      Pertinent Vitals/Pain Pain Assessment: No/denies pain Pain Score: 0-No pain Pain Intervention(s): Limited activity within patient's tolerance;Monitored during session    Home Living  Prior Function            PT Goals (current goals can now be found in the care plan section) Acute Rehab PT Goals Patient Stated Goal: get back to normal PT Goal Formulation: With patient Time For Goal Achievement: 04/18/20 Potential to Achieve Goals: Good Progress towards PT goals:  Progressing toward goals    Frequency    Min 2X/week      PT Plan Current plan remains appropriate    Co-evaluation              AM-PAC PT "6 Clicks" Mobility   Outcome Measure  Help needed turning from your back to your side while in a flat bed without using bedrails?: A Little Help needed moving from lying on your back to sitting on the side of a flat bed without using bedrails?: A Little Help needed moving to and from a bed to a chair (including a wheelchair)?: A Little Help needed standing up from a chair using your arms (e.g., wheelchair or bedside chair)?: A Little Help needed to walk in hospital room?: A Little Help needed climbing 3-5 steps with a railing? : A Lot 6 Click Score: 17    End of Session Equipment Utilized During Treatment: Gait belt Activity Tolerance: Patient tolerated treatment well Patient left: in chair;with call bell/phone within reach;with family/visitor present Nurse Communication: Mobility status PT Visit Diagnosis: Unsteadiness on feet (R26.81);Other abnormalities of gait and mobility (R26.89);Muscle weakness (generalized) (M62.81)     Time: 4210-3128 PT Time Calculation (min) (ACUTE ONLY): 26 min  Charges:  $Gait Training: 8-22 mins $Therapeutic Activity: 8-22 mins                     [name], DPT, PN1   Supplemental Physical Therapist [place]    Pager [phone] Acute Rehab Office [phone]

## 2020-04-06 NOTE — Consult Note (Signed)
   [facility] Inpatient Consult   04/06/2020  NEAL TRULSON [birth year] [phone]   [facility] [facility] [ACO] Patient: Medicare DCE  Patient will be followed by [facility] PAC with traditional Medicare DCE for any known or needs for transitional care needs for returning to post facility care or complex disease management.  Currently, patient is for short term SNF rehab then for returning home noted per inpatient TOC notes.  For questions or referrals, please contact:   Natividad Brood, RN BSN [facility] Liaison  [phone] business mobile phone Toll free office [phone]  Fax number: [phone] [email or web address]

## 2020-04-07 DIAGNOSIS — G9341 Metabolic encephalopathy: Secondary | ICD-10-CM | POA: Diagnosis not present

## 2020-04-07 DIAGNOSIS — K219 Gastro-esophageal reflux disease without esophagitis: Secondary | ICD-10-CM | POA: Diagnosis not present

## 2020-04-07 DIAGNOSIS — N400 Enlarged prostate without lower urinary tract symptoms: Secondary | ICD-10-CM | POA: Diagnosis not present

## 2020-04-07 DIAGNOSIS — U071 COVID-19: Secondary | ICD-10-CM | POA: Diagnosis not present

## 2020-04-07 DIAGNOSIS — J984 Other disorders of lung: Secondary | ICD-10-CM | POA: Diagnosis not present

## 2020-04-07 DIAGNOSIS — A4189 Other specified sepsis: Secondary | ICD-10-CM | POA: Diagnosis not present

## 2020-04-07 DIAGNOSIS — J1282 Pneumonia due to coronavirus disease 2019: Secondary | ICD-10-CM | POA: Diagnosis not present

## 2020-04-07 DIAGNOSIS — E440 Moderate protein-calorie malnutrition: Secondary | ICD-10-CM | POA: Diagnosis not present

## 2020-04-07 DIAGNOSIS — D6949 Other primary thrombocytopenia: Secondary | ICD-10-CM | POA: Diagnosis not present

## 2020-04-07 DIAGNOSIS — R5381 Other malaise: Secondary | ICD-10-CM | POA: Diagnosis not present

## 2020-04-07 DIAGNOSIS — R6520 Severe sepsis without septic shock: Secondary | ICD-10-CM | POA: Diagnosis not present

## 2020-04-07 DIAGNOSIS — I208 Other forms of angina pectoris: Secondary | ICD-10-CM | POA: Diagnosis not present

## 2020-04-07 LAB — CULTURE, BLOOD (ROUTINE X 2)
Culture: NO GROWTH
Culture: NO GROWTH

## 2020-04-08 DIAGNOSIS — N183 Chronic kidney disease, stage 3 unspecified: Secondary | ICD-10-CM | POA: Diagnosis not present

## 2020-04-08 DIAGNOSIS — U071 COVID-19: Secondary | ICD-10-CM | POA: Diagnosis not present

## 2020-04-08 DIAGNOSIS — J984 Other disorders of lung: Secondary | ICD-10-CM | POA: Diagnosis not present

## 2020-04-08 DIAGNOSIS — R7309 Other abnormal glucose: Secondary | ICD-10-CM | POA: Diagnosis not present

## 2020-04-08 DIAGNOSIS — Z8616 Personal history of COVID-19: Secondary | ICD-10-CM | POA: Diagnosis not present

## 2020-04-08 DIAGNOSIS — I2510 Atherosclerotic heart disease of native coronary artery without angina pectoris: Secondary | ICD-10-CM | POA: Diagnosis not present

## 2020-04-08 DIAGNOSIS — I9589 Other hypotension: Secondary | ICD-10-CM | POA: Diagnosis not present

## 2020-04-08 DIAGNOSIS — M069 Rheumatoid arthritis, unspecified: Secondary | ICD-10-CM | POA: Diagnosis not present

## 2020-04-08 DIAGNOSIS — J1282 Pneumonia due to coronavirus disease 2019: Secondary | ICD-10-CM | POA: Diagnosis not present

## 2020-04-08 DIAGNOSIS — E782 Mixed hyperlipidemia: Secondary | ICD-10-CM | POA: Diagnosis not present

## 2020-04-08 DIAGNOSIS — N401 Enlarged prostate with lower urinary tract symptoms: Secondary | ICD-10-CM | POA: Diagnosis not present

## 2020-04-08 DIAGNOSIS — A4189 Other specified sepsis: Secondary | ICD-10-CM | POA: Diagnosis not present

## 2020-04-08 DIAGNOSIS — N400 Enlarged prostate without lower urinary tract symptoms: Secondary | ICD-10-CM | POA: Diagnosis not present

## 2020-04-08 DIAGNOSIS — R2681 Unsteadiness on feet: Secondary | ICD-10-CM | POA: Diagnosis not present

## 2020-04-08 DIAGNOSIS — D649 Anemia, unspecified: Secondary | ICD-10-CM | POA: Diagnosis not present

## 2020-04-08 DIAGNOSIS — J189 Pneumonia, unspecified organism: Secondary | ICD-10-CM | POA: Diagnosis not present

## 2020-04-08 DIAGNOSIS — M6281 Muscle weakness (generalized): Secondary | ICD-10-CM | POA: Diagnosis not present

## 2020-04-08 DIAGNOSIS — K219 Gastro-esophageal reflux disease without esophagitis: Secondary | ICD-10-CM | POA: Diagnosis not present

## 2020-04-08 DIAGNOSIS — J449 Chronic obstructive pulmonary disease, unspecified: Secondary | ICD-10-CM | POA: Diagnosis not present

## 2020-04-11 DIAGNOSIS — M6281 Muscle weakness (generalized): Secondary | ICD-10-CM | POA: Diagnosis not present

## 2020-04-11 DIAGNOSIS — I2510 Atherosclerotic heart disease of native coronary artery without angina pectoris: Secondary | ICD-10-CM | POA: Diagnosis not present

## 2020-04-11 DIAGNOSIS — E782 Mixed hyperlipidemia: Secondary | ICD-10-CM | POA: Diagnosis not present

## 2020-04-11 DIAGNOSIS — Z8616 Personal history of COVID-19: Secondary | ICD-10-CM | POA: Diagnosis not present

## 2020-04-11 DIAGNOSIS — R008 Other abnormalities of heart beat: Secondary | ICD-10-CM | POA: Diagnosis not present

## 2020-04-11 DIAGNOSIS — M069 Rheumatoid arthritis, unspecified: Secondary | ICD-10-CM | POA: Diagnosis not present

## 2020-04-11 DIAGNOSIS — K219 Gastro-esophageal reflux disease without esophagitis: Secondary | ICD-10-CM | POA: Diagnosis not present

## 2020-04-11 DIAGNOSIS — J449 Chronic obstructive pulmonary disease, unspecified: Secondary | ICD-10-CM | POA: Diagnosis not present

## 2020-04-11 DIAGNOSIS — J188 Other pneumonia, unspecified organism: Secondary | ICD-10-CM | POA: Diagnosis not present

## 2020-04-11 DIAGNOSIS — N401 Enlarged prostate with lower urinary tract symptoms: Secondary | ICD-10-CM | POA: Diagnosis not present

## 2020-04-11 DIAGNOSIS — N183 Chronic kidney disease, stage 3 unspecified: Secondary | ICD-10-CM | POA: Diagnosis not present

## 2020-04-11 DIAGNOSIS — R2681 Unsteadiness on feet: Secondary | ICD-10-CM | POA: Diagnosis not present

## 2020-04-11 DIAGNOSIS — J984 Other disorders of lung: Secondary | ICD-10-CM | POA: Diagnosis not present

## 2020-04-11 DIAGNOSIS — J189 Pneumonia, unspecified organism: Secondary | ICD-10-CM | POA: Diagnosis not present

## 2020-04-11 DIAGNOSIS — D649 Anemia, unspecified: Secondary | ICD-10-CM | POA: Diagnosis not present

## 2020-04-11 DIAGNOSIS — I951 Orthostatic hypotension: Secondary | ICD-10-CM | POA: Diagnosis not present

## 2020-04-12 ENCOUNTER — Ambulatory Visit (INDEPENDENT_AMBULATORY_CARE_PROVIDER_SITE_OTHER): Payer: Medicare Other | Admitting: Pulmonary Disease

## 2020-04-12 ENCOUNTER — Encounter: Payer: Self-pay | Admitting: Pulmonary Disease

## 2020-04-12 ENCOUNTER — Other Ambulatory Visit: Payer: Self-pay

## 2020-04-12 VITALS — BP 112/74 | HR 97 | Temp 98.3°F | Ht 74.0 in

## 2020-04-12 DIAGNOSIS — J9611 Chronic respiratory failure with hypoxia: Secondary | ICD-10-CM

## 2020-04-12 DIAGNOSIS — J1282 Pneumonia due to coronavirus disease 2019: Secondary | ICD-10-CM | POA: Diagnosis not present

## 2020-04-12 DIAGNOSIS — I951 Orthostatic hypotension: Secondary | ICD-10-CM | POA: Diagnosis not present

## 2020-04-12 DIAGNOSIS — R5381 Other malaise: Secondary | ICD-10-CM | POA: Diagnosis not present

## 2020-04-12 DIAGNOSIS — Z8616 Personal history of COVID-19: Secondary | ICD-10-CM

## 2020-04-12 DIAGNOSIS — J188 Other pneumonia, unspecified organism: Secondary | ICD-10-CM | POA: Diagnosis not present

## 2020-04-12 DIAGNOSIS — R008 Other abnormalities of heart beat: Secondary | ICD-10-CM | POA: Diagnosis not present

## 2020-04-12 MED ORDER — METOPROLOL TARTRATE 100 MG PO TABS: 50.0000 mg | ORAL_TABLET | Freq: Two times a day (BID) | ORAL | Status: DC

## 2020-04-12 MED ORDER — SPIRIVA RESPIMAT 2.5 MCG/ACT IN AERS: 2.0000 | INHALATION_SPRAY | Freq: Every day | RESPIRATORY_TRACT | 6 refills | Status: DC

## 2020-04-12 MED ORDER — SPIRIVA RESPIMAT 2.5 MCG/ACT IN AERS: 2.0000 | INHALATION_SPRAY | Freq: Every day | RESPIRATORY_TRACT | 0 refills | Status: DC

## 2020-04-12 MED ORDER — BUDESONIDE-FORMOTEROL FUMARATE 160-4.5 MCG/ACT IN AERO: 2.0000 | INHALATION_SPRAY | Freq: Two times a day (BID) | RESPIRATORY_TRACT | 12 refills | Status: DC

## 2020-04-12 NOTE — Patient Instructions (Addendum)
COPD History of covid pneumonia --STOP Dulera --START Symbicort 160-4.5 mcg TWO puffs TWICE a day. This is your EVERYDAY inhaler --START Spiriva 2.5 mcg TWO puffs puff ONCE a day. Sample provided. This is your EVERYDAY inhaler --CONTINUE Albuterol/Ventolin AS NEEDED for shortness of breath or wheezing. Can also use before exercise and activity  Chronic hypoxemic respiratory failure --CONTINUE supplemental oxygen 2L via nasal cannula. Goal SpO2 >88%  Deconditioning --Refer to Pulmonary Rehab. Please arrange after you graduate from [place] with me in 1 month

## 2020-04-12 NOTE — Progress Notes (Signed)
Subjective:   PATIENT ID: Tyrone Roman GENDER: male DOB: 1945/09/25, MRN: [id number]   HPI  Chief Complaint  Patient presents with   Hospitalization Follow-up    Covid pna- out of hosp for 2 weeks now. SOB. Productive cough with light yellow/ green mucus. No wheezing.     Reason for Visit: Hospital Follow-up   Tyrone Roman is a 75 year old male former smoker with RA on rituxan, COPD, CKD IIIa, BPH who presents for hospital follow-up.  Daughter is present.  He has a had a difficult start to his year after being diagnosed with Covid-19 on 02/22/20. At this point he has been hospitalized a total of four times for respiratory failure secondary to covid and complications related to covid. Dates of hospitalization include 1/24-1/27, 2/9-2/11, 2/18-2/25 and 2/26-3/2. He was seen by Pulmonary for consult during his 2/18-2/25 hospitalization and discharged on room air. However he was readmitted on 2/26 for altered mental status and new O2 requirement which he continues now. He reports productive cough, shortness of breath. He is currently living at a Rehab center. He has been compliant with his oxygen Dulera and Albuterol. He is currently not on steroids. He feels this makes him delirious. He is aware that he is weak and is working with his therapists with the goal to go home.  Social History: Former smoker. 20 pack-years. Quit in 1990.  Environmental exposures:  [employer] - exposed to nitric oxide in 1960s  I have personally reviewed patient's past medical/family/social history, allergies, current medications.  Past Medical History:  Diagnosis Date   Allergy    Barrett's esophageal ulceration    BPH (benign prostatic hyperplasia)    CAD (coronary artery disease)    sees Dr. Mar Daring   Cataract    both eyes removed    Colonic polyp    Concussion with loss of consciousness of 30 minutes or less    Contact dermatitis    Contact with or exposure to venereal  diseases    COPD (chronic obstructive pulmonary disease) ([place])    sees Dr. Kara Mead    Diverticulosis of colon    Dysphagia    GERD (gastroesophageal reflux disease)    past hx- on meds    HNP (herniated nucleus pulposus), lumbar    recurrent   Hyperlipidemia    Hypertension    Laceration of scalp    Myocardial infarction [facility])    2011- stent placed   Nephrolithiasis    Pre-operative cardiovascular examination    Rheumatoid arthritis ([place])    sees Dr. Gavin Pound    SOB (shortness of breath)      Family History  Problem Relation Age of Onset   Heart attack Father        cardiovascular disorder   Arthritis Father        family hx   Colon cancer Father        mets   Prostate cancer Father        1st degree relative   Arthritis Mother    Colon polyps Mother    Melanoma Mother    Dementia Mother    Diabetes Paternal Uncle    Colon cancer Paternal Uncle    Stomach cancer Paternal Uncle    Melanoma Paternal Uncle    Cancer Maternal Grandmother    Skin cancer Daughter    Colon polyps Sister    Esophageal cancer Neg Hx    Rectal cancer Neg Hx  Social History   Occupational History   Occupation: Lobbyist: [employer]  Tobacco Use   Smoking status: Former Smoker    Packs/day: 1.00    Years: 20.00    Pack years: 20.00    Types: Cigarettes    Quit date: 10/12/1988    Years since quitting: 31.5   Smokeless tobacco: Never Used   Tobacco comment: 1960's   Vaping Use   Vaping Use: Never used  Substance and Sexual Activity   Alcohol use: Yes    Alcohol/week: 0.0 standard drinks    Comment: rare   Drug use: No   Sexual activity: Not on file    Allergies  Allergen Reactions   Cephalexin Shortness Of Breath, Rash and Other (See Comments)    Tolerated Augmentin 2015 and 2017 (12-2017). Tolerated nafcillin 12/2017 PATIENT HAS HAD A PCN REACTION WITH IMMEDIATE RASH, FACIAL/TONGUE/THROAT SWELLING, SOB, OR  LIGHTHEADEDNESS WITH HYPOTENSION:  #  #  YES  #  #  Has patient had a PCN reaction causing severe rash involving mucus membranes or skin necrosis: No Has patient had a PCN reaction that required hospitalization: No Has patient had a PCN reaction occurring within the last 10 years: No If all of the above answers are "NO", then may proceed   Clarithromycin Shortness Of Breath and Rash   Certolizumab Pegol Hives   Tocilizumab Hives   Doxycycline Rash   Hydroxyzine Rash   Lidoderm Other (See Comments)    "made me act weird"   Pyrithione Zinc Rash     Outpatient Medications Prior to Visit  Medication Sig Dispense Refill   acetaminophen (TYLENOL) 325 MG tablet Take 650 mg by mouth every 6 (six) hours as needed for headache, mild pain or fever.     albuterol (VENTOLIN HFA) 108 (90 Base) MCG/ACT inhaler Inhale 2 puffs into the lungs every 4 (four) hours as needed for wheezing or shortness of breath. 18 g 2   aspirin 81 MG chewable tablet Chew 1 tablet (81 mg total) by mouth daily.     benzonatate (TESSALON) 100 MG capsule Take 1 capsule (100 mg total) by mouth 3 (three) times daily. 20 capsule 0   feeding supplement (ENSURE ENLIVE / ENSURE PLUS) LIQD Take 237 mLs by mouth 2 (two) times daily between meals. (Patient not taking: No sig reported) 237 mL 12   metoprolol tartrate (LOPRESSOR) 100 MG tablet Take 1 tablet (100 mg total) by mouth 2 (two) times daily.     mometasone-formoterol (DULERA) 200-5 MCG/ACT AERO Inhale 2 puffs into the lungs 2 (two) times daily. 1 each    nitroGLYCERIN (NITROSTAT) 0.4 MG SL tablet Place 1 tablet (0.4 mg total) under the tongue every 5 (five) minutes as needed for chest pain (3 doses max). (Patient taking differently: Place 0.4 mg under the tongue every 5 (five) minutes as needed for chest pain (max 3 doses).) 25 tablet 3   Nutritional Supplements (,FEEDING SUPPLEMENT, PROSOURCE PLUS) liquid Take 30 mLs by mouth 2 (two) times daily between meals.      omeprazole (PRILOSEC) 40 MG capsule Take 1 capsule (40 mg total) by mouth daily. 90 capsule 3   simvastatin (ZOCOR) 40 MG tablet TAKE ONE TABLET BY MOUTH DAILY (Patient taking differently: Take 40 mg by mouth at bedtime.) 90 tablet 1   tamsulosin (FLOMAX) 0.4 MG CAPS capsule Take 2 capsules (0.8 mg total) by mouth daily. 180 capsule 3   No facility-administered medications prior to visit.  Review of Systems  Constitutional: Positive for malaise/fatigue. Negative for chills, diaphoresis, fever and weight loss.  HENT: Negative for congestion, ear pain and sore throat.   Respiratory: Positive for cough, sputum production, shortness of breath and wheezing. Negative for hemoptysis.   Cardiovascular: Negative for chest pain, palpitations and leg swelling.  Gastrointestinal: Negative for abdominal pain, heartburn and nausea.  Genitourinary: Negative for frequency.  Musculoskeletal: Negative for joint pain and myalgias.  Skin: Negative for itching and rash.  Neurological: Positive for weakness. Negative for dizziness, focal weakness and headaches.  Endo/Heme/Allergies: Does not bruise/bleed easily.  Psychiatric/Behavioral: Negative for depression. The patient is not nervous/anxious.      Objective:   Vitals:   04/12/20 1448  BP: 112/74  Pulse: 97  Temp: 98.3 F (36.8 C)  SpO2: 94%  Height: 6\' 2"  (1.88 m)     Physical Exam: General: Frail, chronically ill-appearing, no acute distress HENT: , AT, OP clear, MMM Eyes: EOMI, no scleral icterus Respiratory: Clear to auscultation bilaterally.  No crackles, wheezing or rales Cardiovascular: RRR, -M/R/G, no JVD Extremities:-Edema,-tenderness Neuro: AAO x4, CNII-XII grossly intact Psych: Normal mood, normal affect  Data Reviewed:  Imaging: CT CAP 04/02/20 - Diffuse interstitial and GGO slightly improved compared to 03/16/20. Bibasilar reticulation.  PFT: 11/13/2010 FVC 4.65 (91%) FEV1 3.49 (101%) Ratio 73 TLC 91% DLCO  83% Interpretation: Normal PFTs  Labs:  COVID-19 Labs Diagnosed on 02/22/20 initially  No results for input(s): DDIMER, FERRITIN, LDH, CRP in the last 72 hours.  Lab Results  Component Value Date   SARSCOV2NAA POSITIVE (A) 03/25/2020    Assessment & Plan:   Discussion: 75 year old male with emphysema, CAD, GERD and multiple hospitalizations related to Covid pneumonia who presents for follow-up.  We will start triple therapy for his dyspnea.  We will also transition from [facility] to Symbicort per patient insurance preference.  His respiratory failure is multifactorial including postinflammatory fibrosis, emphysema and severe deconditioning.  His CT findings do show improvement in hopefully continue to resolve.  He is determined to be as active as possible and we discussed starting pulmonary rehab once he graduates from the rehab facility.  He will need to continue supplemental oxygen to maintain saturations > 88%.  COPD - mild emphysema on prior CT imaging History of covid pneumonia --STOP Dulera --START Symbicort 160-4.5 mcg TWO puffs TWICE a day. This is your EVERYDAY inhaler --START Spiriva 2.5 mcg TWO puffs puff ONCE a day. Sample provided. This is your EVERYDAY inhaler --CONTINUE Albuterol/Ventolin AS NEEDED for shortness of breath or wheezing. Can also use before exercise and activity  Chronic hypoxemic respiratory failure --CONTINUE supplemental oxygen 2L via nasal cannula. Goal SpO2 >88%  Deconditioning --Refer to Pulmonary Rehab. Please arrange after you graduate from [facility] Maintenance Immunization History  Administered Date(s) Administered   Influenza Split 02/05/2012, 01/26/2013   Influenza Whole 12/07/2002, 01/11/2009, 10/21/2009   Influenza, High Dose Seasonal PF 10/04/2015, 12/05/2016, 11/17/2018   Influenza,inj,Quad PF,6+ Mos 12/27/2014   PFIZER(Purple Top)SARS-COV-2 Vaccination 04/09/2019, 05/06/2019   Pneumococcal Conjugate-13 10/04/2015    Pneumococcal Polysaccharide-23 02/05/2001, 01/11/2009   Td 02/05/2006   CT Lung Screen-not indicated.  Recent imaging available  Orders Placed This Encounter  Procedures   AMB referral to pulmonary rehabilitation    Referral Priority:   Routine    Referral Type:   Consultation    Number of Visits Requested:   1   Meds ordered this encounter  Medications   metoprolol tartrate (LOPRESSOR) 100 MG  tablet    Sig: Take 0.5 tablets (50 mg total) by mouth 2 (two) times daily.   budesonide-formoterol (SYMBICORT) 160-4.5 MCG/ACT inhaler    Sig: Inhale 2 puffs into the lungs in the morning and at bedtime.    Dispense:  1 each    Refill:  12   Tiotropium Bromide Monohydrate (SPIRIVA RESPIMAT) 2.5 MCG/ACT AERS    Sig: Inhale 2 puffs into the lungs daily.    Dispense:  4 g    Refill:  6   Tiotropium Bromide Monohydrate (SPIRIVA RESPIMAT) 2.5 MCG/ACT AERS    Sig: Inhale 2 puffs into the lungs daily.    Dispense:  4 g    Refill:  0    Order Specific Question:   Lot Number?    Answer:   [id number]    Return in about 4 weeks (around 05/10/2020).  I have spent a total time of 45-minutes on the day of the appointment reviewing prior documentation, coordinating care and discussing medical diagnosis and plan with the patient/family. Imaging, labs and tests included in this note have been reviewed and interpreted independently by me.  [place], MD [facility] 04/12/2020 10:28 AM  Office Number [phone]

## 2020-04-13 ENCOUNTER — Encounter: Payer: Self-pay | Admitting: Pulmonary Disease

## 2020-04-13 DIAGNOSIS — R6520 Severe sepsis without septic shock: Secondary | ICD-10-CM | POA: Diagnosis not present

## 2020-04-13 DIAGNOSIS — J984 Other disorders of lung: Secondary | ICD-10-CM | POA: Diagnosis not present

## 2020-04-13 DIAGNOSIS — U071 COVID-19: Secondary | ICD-10-CM | POA: Diagnosis not present

## 2020-04-13 DIAGNOSIS — R008 Other abnormalities of heart beat: Secondary | ICD-10-CM | POA: Diagnosis not present

## 2020-04-13 DIAGNOSIS — G478 Other sleep disorders: Secondary | ICD-10-CM | POA: Diagnosis not present

## 2020-04-13 DIAGNOSIS — I9589 Other hypotension: Secondary | ICD-10-CM | POA: Diagnosis not present

## 2020-04-14 DIAGNOSIS — J188 Other pneumonia, unspecified organism: Secondary | ICD-10-CM | POA: Diagnosis not present

## 2020-04-14 DIAGNOSIS — Z0189 Encounter for other specified special examinations: Secondary | ICD-10-CM | POA: Diagnosis not present

## 2020-04-14 DIAGNOSIS — I9589 Other hypotension: Secondary | ICD-10-CM | POA: Diagnosis not present

## 2020-04-14 DIAGNOSIS — R3989 Other symptoms and signs involving the genitourinary system: Secondary | ICD-10-CM | POA: Diagnosis not present

## 2020-04-15 DIAGNOSIS — N401 Enlarged prostate with lower urinary tract symptoms: Secondary | ICD-10-CM | POA: Diagnosis not present

## 2020-04-15 DIAGNOSIS — I951 Orthostatic hypotension: Secondary | ICD-10-CM | POA: Diagnosis not present

## 2020-04-15 DIAGNOSIS — M6281 Muscle weakness (generalized): Secondary | ICD-10-CM | POA: Diagnosis not present

## 2020-04-15 DIAGNOSIS — J984 Other disorders of lung: Secondary | ICD-10-CM | POA: Diagnosis not present

## 2020-04-15 DIAGNOSIS — A4189 Other specified sepsis: Secondary | ICD-10-CM | POA: Diagnosis not present

## 2020-04-15 DIAGNOSIS — M069 Rheumatoid arthritis, unspecified: Secondary | ICD-10-CM | POA: Diagnosis not present

## 2020-04-15 DIAGNOSIS — R008 Other abnormalities of heart beat: Secondary | ICD-10-CM | POA: Diagnosis not present

## 2020-04-15 DIAGNOSIS — D649 Anemia, unspecified: Secondary | ICD-10-CM | POA: Diagnosis not present

## 2020-04-15 DIAGNOSIS — J189 Pneumonia, unspecified organism: Secondary | ICD-10-CM | POA: Diagnosis not present

## 2020-04-15 DIAGNOSIS — J158 Pneumonia due to other specified bacteria: Secondary | ICD-10-CM | POA: Diagnosis not present

## 2020-04-15 DIAGNOSIS — R5381 Other malaise: Secondary | ICD-10-CM | POA: Diagnosis not present

## 2020-04-15 DIAGNOSIS — E871 Hypo-osmolality and hyponatremia: Secondary | ICD-10-CM | POA: Diagnosis not present

## 2020-04-15 DIAGNOSIS — R2681 Unsteadiness on feet: Secondary | ICD-10-CM | POA: Diagnosis not present

## 2020-04-15 DIAGNOSIS — U071 COVID-19: Secondary | ICD-10-CM | POA: Diagnosis not present

## 2020-04-15 DIAGNOSIS — E782 Mixed hyperlipidemia: Secondary | ICD-10-CM | POA: Diagnosis not present

## 2020-04-15 DIAGNOSIS — Z8616 Personal history of COVID-19: Secondary | ICD-10-CM | POA: Diagnosis not present

## 2020-04-15 DIAGNOSIS — N183 Chronic kidney disease, stage 3 unspecified: Secondary | ICD-10-CM | POA: Diagnosis not present

## 2020-04-15 DIAGNOSIS — G9341 Metabolic encephalopathy: Secondary | ICD-10-CM | POA: Diagnosis not present

## 2020-04-15 DIAGNOSIS — I2510 Atherosclerotic heart disease of native coronary artery without angina pectoris: Secondary | ICD-10-CM | POA: Diagnosis not present

## 2020-04-15 DIAGNOSIS — K219 Gastro-esophageal reflux disease without esophagitis: Secondary | ICD-10-CM | POA: Diagnosis not present

## 2020-04-15 DIAGNOSIS — J449 Chronic obstructive pulmonary disease, unspecified: Secondary | ICD-10-CM | POA: Diagnosis not present

## 2020-04-15 DIAGNOSIS — R5081 Fever presenting with conditions classified elsewhere: Secondary | ICD-10-CM | POA: Diagnosis not present

## 2020-04-15 DIAGNOSIS — I110 Hypertensive heart disease with heart failure: Secondary | ICD-10-CM | POA: Diagnosis not present

## 2020-04-18 DIAGNOSIS — J158 Pneumonia due to other specified bacteria: Secondary | ICD-10-CM | POA: Diagnosis not present

## 2020-04-18 DIAGNOSIS — Z0189 Encounter for other specified special examinations: Secondary | ICD-10-CM | POA: Diagnosis not present

## 2020-04-18 DIAGNOSIS — E871 Hypo-osmolality and hyponatremia: Secondary | ICD-10-CM | POA: Diagnosis not present

## 2020-04-19 ENCOUNTER — Telehealth: Payer: Medicare Other

## 2020-04-19 DIAGNOSIS — E871 Hypo-osmolality and hyponatremia: Secondary | ICD-10-CM | POA: Diagnosis not present

## 2020-04-19 DIAGNOSIS — R3989 Other symptoms and signs involving the genitourinary system: Secondary | ICD-10-CM | POA: Diagnosis not present

## 2020-04-19 DIAGNOSIS — J984 Other disorders of lung: Secondary | ICD-10-CM | POA: Diagnosis not present

## 2020-04-19 DIAGNOSIS — J158 Pneumonia due to other specified bacteria: Secondary | ICD-10-CM | POA: Diagnosis not present

## 2020-04-19 DIAGNOSIS — I951 Orthostatic hypotension: Secondary | ICD-10-CM | POA: Diagnosis not present

## 2020-04-21 DIAGNOSIS — R2681 Unsteadiness on feet: Secondary | ICD-10-CM | POA: Diagnosis not present

## 2020-04-21 DIAGNOSIS — K219 Gastro-esophageal reflux disease without esophagitis: Secondary | ICD-10-CM | POA: Diagnosis not present

## 2020-04-21 DIAGNOSIS — Z8616 Personal history of COVID-19: Secondary | ICD-10-CM | POA: Diagnosis not present

## 2020-04-21 DIAGNOSIS — M6281 Muscle weakness (generalized): Secondary | ICD-10-CM | POA: Diagnosis not present

## 2020-04-21 DIAGNOSIS — D649 Anemia, unspecified: Secondary | ICD-10-CM | POA: Diagnosis not present

## 2020-04-21 DIAGNOSIS — M069 Rheumatoid arthritis, unspecified: Secondary | ICD-10-CM | POA: Diagnosis not present

## 2020-04-21 DIAGNOSIS — I2510 Atherosclerotic heart disease of native coronary artery without angina pectoris: Secondary | ICD-10-CM | POA: Diagnosis not present

## 2020-04-21 DIAGNOSIS — J189 Pneumonia, unspecified organism: Secondary | ICD-10-CM | POA: Diagnosis not present

## 2020-04-21 DIAGNOSIS — E782 Mixed hyperlipidemia: Secondary | ICD-10-CM | POA: Diagnosis not present

## 2020-04-21 DIAGNOSIS — J449 Chronic obstructive pulmonary disease, unspecified: Secondary | ICD-10-CM | POA: Diagnosis not present

## 2020-04-21 DIAGNOSIS — N183 Chronic kidney disease, stage 3 unspecified: Secondary | ICD-10-CM | POA: Diagnosis not present

## 2020-04-21 DIAGNOSIS — N401 Enlarged prostate with lower urinary tract symptoms: Secondary | ICD-10-CM | POA: Diagnosis not present

## 2020-04-21 NOTE — Addendum Note (Signed)
Addended by: Amado Coe on: 04/21/2020 03:39 PM   Modules accepted: Orders

## 2020-04-22 ENCOUNTER — Encounter (HOSPITAL_COMMUNITY): Payer: Self-pay | Admitting: *Deleted

## 2020-04-22 NOTE — Progress Notes (Signed)
Received referral from Dr. Loanne Drilling for this pt to participate in pulmonary rehab with the the diagnosis of Covid Pneumonia. Clinical review of pt follow up appt on 04/12/20  Pulmonary office note.  This is a post hospital follow up.  Pt with multiple admissions since positive diagnosis on 1/17.  With last admission pt was discharged to Rehab.  Pt will be appropriate to schedule for pulmonary rehab once he has discharged from rehab and completed any home health PT if advised.   Pt with Covid Risk Score - 7.  Will forward to support staff for verification of insurance eligibility/benefits and contact pt daughter for any known proposed date for discharge from rehab. Cherre Huger, BSN Cardiac and Training and development officer

## 2020-04-25 ENCOUNTER — Telehealth: Payer: Self-pay | Admitting: Family Medicine

## 2020-04-25 ENCOUNTER — Other Ambulatory Visit: Payer: Self-pay | Admitting: *Deleted

## 2020-04-25 DIAGNOSIS — R3989 Other symptoms and signs involving the genitourinary system: Secondary | ICD-10-CM | POA: Diagnosis not present

## 2020-04-25 DIAGNOSIS — R2681 Unsteadiness on feet: Secondary | ICD-10-CM | POA: Diagnosis not present

## 2020-04-25 DIAGNOSIS — N183 Chronic kidney disease, stage 3 unspecified: Secondary | ICD-10-CM | POA: Diagnosis not present

## 2020-04-25 DIAGNOSIS — E782 Mixed hyperlipidemia: Secondary | ICD-10-CM | POA: Diagnosis not present

## 2020-04-25 DIAGNOSIS — U071 COVID-19: Secondary | ICD-10-CM | POA: Diagnosis not present

## 2020-04-25 DIAGNOSIS — J449 Chronic obstructive pulmonary disease, unspecified: Secondary | ICD-10-CM | POA: Diagnosis not present

## 2020-04-25 DIAGNOSIS — K219 Gastro-esophageal reflux disease without esophagitis: Secondary | ICD-10-CM | POA: Diagnosis not present

## 2020-04-25 DIAGNOSIS — J189 Pneumonia, unspecified organism: Secondary | ICD-10-CM | POA: Diagnosis not present

## 2020-04-25 DIAGNOSIS — I2510 Atherosclerotic heart disease of native coronary artery without angina pectoris: Secondary | ICD-10-CM | POA: Diagnosis not present

## 2020-04-25 DIAGNOSIS — J984 Other disorders of lung: Secondary | ICD-10-CM | POA: Diagnosis not present

## 2020-04-25 DIAGNOSIS — N401 Enlarged prostate with lower urinary tract symptoms: Secondary | ICD-10-CM | POA: Diagnosis not present

## 2020-04-25 DIAGNOSIS — M6281 Muscle weakness (generalized): Secondary | ICD-10-CM | POA: Diagnosis not present

## 2020-04-25 DIAGNOSIS — D649 Anemia, unspecified: Secondary | ICD-10-CM | POA: Diagnosis not present

## 2020-04-25 DIAGNOSIS — I479 Paroxysmal tachycardia, unspecified: Secondary | ICD-10-CM | POA: Diagnosis not present

## 2020-04-25 DIAGNOSIS — I951 Orthostatic hypotension: Secondary | ICD-10-CM | POA: Diagnosis not present

## 2020-04-25 DIAGNOSIS — Z8616 Personal history of COVID-19: Secondary | ICD-10-CM | POA: Diagnosis not present

## 2020-04-25 DIAGNOSIS — M069 Rheumatoid arthritis, unspecified: Secondary | ICD-10-CM | POA: Diagnosis not present

## 2020-04-25 NOTE — Patient Outreach (Signed)
[facility] Post- Acute Care Coordinator follow up. Member screened for potential [facility] Care Management needs.  Communication sent to [facility] to inquire about transition plans.    Marthenia Rolling, MSN, RN,BSN [place] Acute Care Coordinator [phone] [facility]) [phone]  (Toll free office)

## 2020-04-26 ENCOUNTER — Ambulatory Visit (INDEPENDENT_AMBULATORY_CARE_PROVIDER_SITE_OTHER): Payer: Medicare Other | Admitting: Internal Medicine

## 2020-04-26 ENCOUNTER — Other Ambulatory Visit: Payer: Self-pay

## 2020-04-26 ENCOUNTER — Other Ambulatory Visit: Payer: Self-pay | Admitting: *Deleted

## 2020-04-26 ENCOUNTER — Encounter: Payer: Self-pay | Admitting: Internal Medicine

## 2020-04-26 VITALS — BP 110/90 | HR 65 | Ht 74.0 in | Wt 195.0 lb

## 2020-04-26 DIAGNOSIS — R6889 Other general symptoms and signs: Secondary | ICD-10-CM | POA: Diagnosis not present

## 2020-04-26 DIAGNOSIS — E785 Hyperlipidemia, unspecified: Secondary | ICD-10-CM

## 2020-04-26 DIAGNOSIS — J984 Other disorders of lung: Secondary | ICD-10-CM | POA: Diagnosis not present

## 2020-04-26 DIAGNOSIS — R0789 Other chest pain: Secondary | ICD-10-CM | POA: Diagnosis not present

## 2020-04-26 DIAGNOSIS — E871 Hypo-osmolality and hyponatremia: Secondary | ICD-10-CM | POA: Diagnosis not present

## 2020-04-26 DIAGNOSIS — I10 Essential (primary) hypertension: Secondary | ICD-10-CM

## 2020-04-26 DIAGNOSIS — J439 Emphysema, unspecified: Secondary | ICD-10-CM

## 2020-04-26 DIAGNOSIS — I25118 Atherosclerotic heart disease of native coronary artery with other forms of angina pectoris: Secondary | ICD-10-CM

## 2020-04-26 DIAGNOSIS — N1832 Chronic kidney disease, stage 3b: Secondary | ICD-10-CM | POA: Diagnosis not present

## 2020-04-26 DIAGNOSIS — N3941 Urge incontinence: Secondary | ICD-10-CM | POA: Diagnosis not present

## 2020-04-26 DIAGNOSIS — I479 Paroxysmal tachycardia, unspecified: Secondary | ICD-10-CM | POA: Diagnosis not present

## 2020-04-26 DIAGNOSIS — R55 Syncope and collapse: Secondary | ICD-10-CM | POA: Diagnosis not present

## 2020-04-26 MED ORDER — NITROGLYCERIN 0.4 MG SL SUBL: 0.4000 mg | SUBLINGUAL_TABLET | SUBLINGUAL | 3 refills | Status: DC | PRN

## 2020-04-26 MED ORDER — ISOSORBIDE MONONITRATE ER 30 MG PO TB24: 30.0000 mg | ORAL_TABLET | Freq: Every day | ORAL | 3 refills | Status: DC

## 2020-04-26 NOTE — Progress Notes (Signed)
Cardiology Office Note:    Date:  04/26/2020   ID:  Tyrone Roman, DOB [birth year], MRN [id number]  PCP:  [name], MD   [place]  Cardiologist:  Werner Lean, MD  Dr. Johnsie Cancel 2018 Advanced Practice Provider:  No care team member to display Electrophysiologist:  None       CC: Chest Pain Consulted for the evaluation of low blood pressure and high heart rate at the behest of [name], MD  History of Present Illness:    Tyrone Roman is a 75 y.o. male with a hx of CAD s/p PCI in 2011, HTN, HLD, COPD, Asthma, Abestatosis; RA; CKD stage IIIb; Covid 02/22/20 who presents with for evaluation 04/26/20.  Patient notes that he is feeling a return of chest pain and discomfort.  This is better than when he was being seen in 2011 but is still persistent with exertion.  Discomfort occurs with his rehab:  Patient has had multiple admissions recently for COVID, then Omicron, that superimposed PNA; has required ABX, steroid bursts, and rehab.  Always feels shortness of breath- is on 3 L home O2.   No syncope or near syncope . Notes  no palpitations or funny heart beats.   Is not sure what he is taking:  Notes that many medications have changed because he has had high and low blood pressures.  Has never taken a sublingual nitroglycerin.   Past Medical History:  Diagnosis Date  . Allergy   . Barrett's esophageal ulceration   . BPH (benign prostatic hyperplasia)   . CAD (coronary artery disease)    sees Dr. Mar Daring  . Cataract    both eyes removed   . Colonic polyp   . Concussion with loss of consciousness of 30 minutes or less   . Contact dermatitis   . Contact with or exposure to venereal diseases   . COPD (chronic obstructive pulmonary disease) ([place])    sees Dr. Kara Mead   . Diverticulosis of colon   . Dysphagia   . GERD (gastroesophageal reflux disease)    past hx- on meds   . HNP (herniated nucleus pulposus), lumbar    recurrent  .  Hyperlipidemia   . Hypertension   . Laceration of scalp   . Myocardial infarction [facility])    2011- stent placed  . Nephrolithiasis   . Pre-operative cardiovascular examination   . Rheumatoid arthritis [facility])    sees Dr. Gavin Pound   . SOB (shortness of breath)     Past Surgical History:  Procedure Laterality Date  . BACK SURGERY    . CARDIAC CATHETERIZATION N/A 01/19/2016   Procedure: Left Heart Cath and Coronary Angiography;  Surgeon: Burnell Blanks, MD;  Location: [place] [facility];  Service: Cardiovascular;  Laterality: N/A;  . COLONOSCOPY  12/09/2014   per Dr. Silverio Decamp, adenomatous polyps, repeat 3 years   . CORONARY ANGIOPLASTY WITH STENT PLACEMENT    . ESOPHAGOGASTRODUODENOSCOPY (EGD) WITH ESOPHAGEAL DILATION  02/17/09   Barretts esophagus   . EYE SURGERY    . KNEE ARTHROSCOPY Left X 2  . LUMBAR LAMINECTOMY/DECOMPRESSION MICRODISCECTOMY Right 10/07/2017   Procedure: RIGHT LUMBAR ONE-TWO LAMINECTOMY WITH MICRODISCECTOMY;  Surgeon: Consuella Lose, MD;  Location: [place];  Service: Neurosurgery;  Laterality: Right;  . LUMBAR LAMINECTOMY/DECOMPRESSION MICRODISCECTOMY N/A 12/06/2017   Procedure: RECURRENT MICRODISCECTOMY LUMBAR ONE - LUMBAR TWO;  Surgeon: Consuella Lose, MD;  Location: [place];  Service: Neurosurgery;  Laterality: N/A;  .  LUMBAR WOUND DEBRIDEMENT N/A 12/11/2017   Procedure: LUMBAR WOUND DEBRIDEMENT;  Surgeon: Consuella Lose, MD;  Location: [place];  Service: Neurosurgery;  Laterality: N/A;  . POLYPECTOMY    . TONSILLECTOMY    . UPPER GASTROINTESTINAL ENDOSCOPY      Current Medications: Current Meds  Medication Sig  . acetaminophen (TYLENOL) 325 MG tablet Take 650 mg by mouth every 6 (six) hours as needed for headache, mild pain or fever.  Marland Kitchen albuterol (VENTOLIN HFA) 108 (90 Base) MCG/ACT inhaler Inhale 2 puffs into the lungs every 4 (four) hours as needed for wheezing or shortness of breath.  Marland Kitchen aspirin 81 MG chewable tablet Chew 1 tablet (81 mg  total) by mouth daily.  . budesonide-formoterol (SYMBICORT) 160-4.5 MCG/ACT inhaler Inhale 2 puffs into the lungs in the morning and at bedtime.  . feeding supplement (ENSURE ENLIVE / ENSURE PLUS) LIQD Take 237 mLs by mouth 2 (two) times daily between meals.  . isosorbide mononitrate (IMDUR) 30 MG 24 hr tablet Take 1 tablet (30 mg total) by mouth daily.  . Nutritional Supplements (,FEEDING SUPPLEMENT, PROSOURCE PLUS) liquid Take 30 mLs by mouth 2 (two) times daily between meals.  Marland Kitchen omeprazole (PRILOSEC) 40 MG capsule Take 1 capsule (40 mg total) by mouth daily.  . simvastatin (ZOCOR) 40 MG tablet TAKE ONE TABLET BY MOUTH DAILY (Patient taking differently: Take 40 mg by mouth at bedtime.)  . Tiotropium Bromide Monohydrate (SPIRIVA RESPIMAT) 2.5 MCG/ACT AERS Inhale 2 puffs into the lungs daily.  . [DISCONTINUED] metoprolol tartrate (LOPRESSOR) 100 MG tablet Take 0.5 tablets (50 mg total) by mouth 2 (two) times daily.  . [DISCONTINUED] nitroGLYCERIN (NITROSTAT) 0.4 MG SL tablet Place 1 tablet (0.4 mg total) under the tongue every 5 (five) minutes as needed for chest pain (3 doses max). (Patient taking differently: Place 0.4 mg under the tongue every 5 (five) minutes as needed for chest pain (max 3 doses).)     Allergies:   Cephalexin, Clarithromycin, Certolizumab pegol, Tocilizumab, Doxycycline, Hydroxyzine, Lidoderm, and Pyrithione zinc   Social History   Socioeconomic History  . Marital status: Divorced    Spouse name: Not on file  . Number of children: 4  . Years of education: Not on file  . Highest education level: Not on file  Occupational History  . Occupation: ENGINEER    Employer: [employer]  Tobacco Use  . Smoking status: Former Smoker    Packs/day: 1.00    Years: 20.00    Pack years: 20.00    Types: Cigarettes    Quit date: 10/12/1988    Years since quitting: 31.5  . Smokeless tobacco: Never Used  . Tobacco comment: 1960's   Vaping Use  . Vaping Use: Never used  Substance  and Sexual Activity  . Alcohol use: Yes    Alcohol/week: 0.0 standard drinks    Comment: rare  . Drug use: No  . Sexual activity: Not on file  Other Topics Concern  . 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       04/14/2018:   Lives alone, has girlfriend who visits, main source of emotional support   Has been struggling with recent hospitalizations/ill health following sepsis and back surgeries   Social Determinants of Health   Financial Resource Strain: Low Risk   . Difficulty of Paying Living Expenses: Not hard at all  Food Insecurity: No Food Insecurity  . Worried About Running  Out of Food in the Last Year: Never true  . Ran Out of Food in the Last Year: Never true  Transportation Needs: No Transportation Needs  . Lack of Transportation (Medical): No  . Lack of Transportation (Non-Medical): No  Physical Activity: Insufficiently Active  . Days of Exercise per Week: 2 days  . Minutes of Exercise per Session: 60 min  Stress: Stress Concern Present  . Feeling of Stress : To some extent  Social Connections: Moderately Isolated  . Frequency of Communication with Friends and Family: More than three times a week  . Frequency of Social Gatherings with Friends and Family: Once a week  . Attends Religious Services: Never  . Active Member of Clubs or Organizations: Yes  . Attends Archivist Meetings: More than 4 times per year  . Marital Status: Divorced    Social:  Initialyl came in with daughter  Family History: The patient's family history includes Arthritis in his father and mother; Cancer in his maternal grandmother; Colon cancer in his father and paternal uncle; Colon polyps in his mother and sister; Dementia in his mother; Diabetes in his paternal uncle; Heart attack in his father; Melanoma in his mother and paternal uncle; Prostate cancer in his father; Skin cancer in his daughter;  Stomach cancer in his paternal uncle. There is no history of Esophageal cancer or Rectal cancer.  ROS:   Please see the history of present illness.     All other systems reviewed and are negative.  EKGs/Labs/Other Studies Reviewed:    The following studies were reviewed today: EKG:   04/05/20: SR RBBB Rate 74 septal infarct and inferior TWI  Transthoracic Echocardiogram: Date: 04/03/20 Results: 1. Left ventricular ejection fraction, by estimation, is 60 to 65%. The  left ventricle has no regional wall motion abnormalities. Left ventricular  diastolic parameters are indeterminate.  2. Right ventricular systolic function is normal. The right ventricular  size is normal.  3. Limited echo to evaluate LV function.   NM Stress Testing : Date: 01/03/2016 Results:  Nuclear stress EF: 63%.  There was no ST segment deviation noted during stress.  The study is normal.  This is a low risk study.  The left ventricular ejection fraction is normal (55-65%).   Left/Right Heart Catheterizations: Date:04/26/20 Results:  The left ventricular systolic function is normal.  LV end diastolic pressure is normal.  The left ventricular ejection fraction is 50-55% by visual estimate.  There is no mitral valve regurgitation.  Prox RCA lesion, 20 %stenosed.  2nd Mrg lesion, 20 %stenosed.  Prox LAD to Mid LAD lesion, 10 %stenosed.  Mid LAD to Dist LAD lesion, 10 %stenosed.   1. Single vessel CAD with patent stent in the proximal LAD with minimal restenosis 2. Mild plaque in the non-dominant RCA  3. Normal LV systolic function  Recommendations: Continue medical management of CAD.    Recent Labs: 04/04/2020: TSH 0.490 04/05/2020: ALT 40; B Natriuretic Peptide 143.0; BUN 23; Creatinine, Ser 1.20; Hemoglobin 10.5; Magnesium 1.8; Platelets 231; Potassium 4.4; Sodium 134  Recent Lipid Panel    Component Value Date/Time   CHOL 158 05/11/2019 1334   TRIG 201 (H) 02/29/2020 1343   HDL  47.70 05/11/2019 1334   CHOLHDL 3 05/11/2019 1334   VLDL 27.8 05/11/2019 1334   LDLCALC 83 05/11/2019 1334    Risk Assessment/Calculations:    N/A  Physical Exam:    VS:  BP 110/90   Pulse 65   Ht 6\' 2"  (1.88 m)  Wt 195 lb (88.5 kg)   SpO2 98%   BMI 25.04 kg/m     Wt Readings from Last 3 Encounters:  04/26/20 195 lb (88.5 kg)  04/05/20 198 lb 3.1 oz (89.9 kg)  03/29/20 200 lb 9.9 oz (91 kg)    GEN: Elderly Male in Mild Distress HEENT: Left ear Frank's Sign NECK: No JVD LYMPHATICS: No lymphadenopathy CARDIAC: RRR, no murmurs, rubs, gallops RESPIRATORY:  Coarse breath sounds bilaterally with no wheezes ABDOMEN: Soft, non-tender, non-distended MUSCULOSKELETAL:  No edema; No deformity  SKIN: Warm and dry NEUROLOGIC:  Alert and oriented x 3 PSYCHIATRIC:  Normal affect   ASSESSMENT:    1. Coronary artery disease of native artery of native heart with stable angina pectoris (HCC)   2. Stage 3b chronic kidney disease ([place])   3. Pulmonary emphysema, unspecified emphysema type ([place])   4. Essential hypertension   5. Hyperlipidemia, unspecified hyperlipidemia type    PLAN:    In order of problems listed above:  Coronary Artery Disease; Obstructive COPD with no active wheeze Asbestosis NOS COVID- post 19 sequelae HTN HLD CKD Stage IIIb - symptomatic  - anatomy: mild ISR LAD stents in 2017 - continue ASA 81 mg - continue statin, goal LDL < 70 (will attempt to further define prevention plans at next visit) - unclear if BB intolerant with multiple reports or high and low HR and BP; will DC - continue nitrates as PRN and start Imdur 30 mg; if unable to tolerate start Ranexa - Would recommend lexiscan nuclear medicine stress test (NPO at midnight/ do not hold IMDUR); discussed risks, benefits, and alternatives of the diagnostic procedure including chest pain, arrhythmia, and death.  Patient amenable for testing. - if positive, discussed risks and benefits of cardiac  catheterization have been discussed with the patient.  These include bleeding, infection, kidney damage, stroke, heart attack, death.  The patient understands these risks and is willing to proceed if necessary   Four to 6 week  follow up with me or APP unless new symptoms or abnormal test results warranting change in plan   Shared Decision Making/Informed Consent The risks [chest pain, shortness of breath, cardiac arrhythmias, dizziness, blood pressure fluctuations, myocardial infarction, stroke/transient ischemic attack, nausea, vomiting, allergic reaction, radiation exposure, metallic taste sensation and life-threatening complications (estimated to be 1 in 10,000)], benefits (risk stratification, diagnosing coronary artery disease, treatment guidance) and alternatives of a nuclear stress test were discussed in detail with Mr. Murri and he agrees to proceed.       Medication Adjustments/Labs and Tests Ordered: Current medicines are reviewed at length with the patient today.  Concerns regarding medicines are outlined above.  Orders Placed This Encounter  Procedures  . Cardiac Stress Test: Informed Consent Details: Physician/Practitioner Attestation; Transcribe to consent form and obtain patient signature  . MYOCARDIAL PERFUSION IMAGING   Meds ordered this encounter  Medications  . isosorbide mononitrate (IMDUR) 30 MG 24 hr tablet    Sig: Take 1 tablet (30 mg total) by mouth daily.    Dispense:  90 tablet    Refill:  3  . nitroGLYCERIN (NITROSTAT) 0.4 MG SL tablet    Sig: Place 1 tablet (0.4 mg total) under the tongue every 5 (five) minutes as needed for chest pain (3 doses max).    Dispense:  25 tablet    Refill:  3    Patient Instructions  Medication Instructions:  Your physician has recommended you make the following change in your medication:  STOP: metoprolol  START:isosorbide mononitrate (Imdur) 30 mg by mouth daily  *If you need a refill on your cardiac medications  before your next appointment, please call your pharmacy*   Lab Work: NONE If you have labs (blood work) drawn today and your tests are completely normal, you will receive your results only by: Marland Kitchen MyChart Message (if you have MyChart) OR . A paper copy in the mail If you have any lab test that is abnormal or we need to change your treatment, we will call you to review the results.   Testing/Procedures:  Your physician has requested that you have a lexiscan myoview.  Please follow instruction sheet, as given.    Follow-Up: At [facility], you and your health needs are our priority.  As part of our continuing mission to provide you with exceptional heart care, we have created designated Provider Care Teams.  These Care Teams include your primary Cardiologist (physician) and Advanced Practice Providers (APPs -  Physician Assistants and Nurse Practitioners) who all work together to provide you with the care you need, when you need it.  We recommend signing up for the patient portal called "MyChart".  Sign up information is provided on this After Visit Summary.  MyChart is used to connect with patients for Virtual Visits (Telemedicine).  Patients are able to view lab/test results, encounter notes, upcoming appointments, etc.  Non-urgent messages can be sent to your provider as well.   To learn more about what you can do with MyChart, go to [email or web address].    Your next appointment:   4-6 week(s)  The format for your next appointment:   In Person  Provider:   You may see Werner Lean, MD or one of the following Advanced Practice Providers on your designated Care Team:    Melina Copa, PA-C  Ermalinda Barrios, PA-C          Signed, Werner Lean, MD  04/26/2020 9:11 AM    [place]

## 2020-04-26 NOTE — Patient Outreach (Signed)
[place] Coordinator follow up. Member screened for potential [facility] needs.  Update received from [place] indicating member's transition plan is to return home. Reports daughter is supposed to stay with him initially and then he may have paid caregivers there after. Reports BP has been fluctuating. Cardiology appointment today.   Patient goes to PCP office that [facility] embedded care coordination team. Mr. Berkley may also benefit from Remote Health. Will plan outreach to Mr. Xu and/or daughter/DPR [name] to discuss as appropriate.     Marthenia Rolling, MSN, RN,BSN [place] Acute Care Coordinator [phone] [facility]) [phone]  (Toll free office)

## 2020-04-26 NOTE — Patient Instructions (Signed)
Medication Instructions:  Your physician has recommended you make the following change in your medication:  STOP: metoprolol  START:isosorbide mononitrate (Imdur) 30 mg by mouth daily  *If you need a refill on your cardiac medications before your next appointment, please call your pharmacy*   Lab Work: NONE If you have labs (blood work) drawn today and your tests are completely normal, you will receive your results only by: Marland Kitchen MyChart Message (if you have MyChart) OR . A paper copy in the mail If you have any lab test that is abnormal or we need to change your treatment, we will call you to review the results.   Testing/Procedures:  Your physician has requested that you have a lexiscan myoview.  Please follow instruction sheet, as given.    Follow-Up: At [facility], you and your health needs are our priority.  As part of our continuing mission to provide you with exceptional heart care, we have created designated Provider Care Teams.  These Care Teams include your primary Cardiologist (physician) and Advanced Practice Providers (APPs -  Physician Assistants and Nurse Practitioners) who all work together to provide you with the care you need, when you need it.  We recommend signing up for the patient portal called "MyChart".  Sign up information is provided on this After Visit Summary.  MyChart is used to connect with patients for Virtual Visits (Telemedicine).  Patients are able to view lab/test results, encounter notes, upcoming appointments, etc.  Non-urgent messages can be sent to your provider as well.   To learn more about what you can do with MyChart, go to [email or web address].    Your next appointment:   4-6 week(s)  The format for your next appointment:   In Person  Provider:   You may see Werner Lean, MD or one of the following Advanced Practice Providers on your designated Care Team:    Melina Copa, PA-C  Ermalinda Barrios, PA-C

## 2020-04-27 DIAGNOSIS — J158 Pneumonia due to other specified bacteria: Secondary | ICD-10-CM | POA: Diagnosis not present

## 2020-04-27 DIAGNOSIS — I479 Paroxysmal tachycardia, unspecified: Secondary | ICD-10-CM | POA: Diagnosis not present

## 2020-04-27 DIAGNOSIS — I951 Orthostatic hypotension: Secondary | ICD-10-CM | POA: Diagnosis not present

## 2020-04-27 DIAGNOSIS — I2510 Atherosclerotic heart disease of native coronary artery without angina pectoris: Secondary | ICD-10-CM | POA: Diagnosis not present

## 2020-04-28 DIAGNOSIS — I2510 Atherosclerotic heart disease of native coronary artery without angina pectoris: Secondary | ICD-10-CM | POA: Diagnosis not present

## 2020-04-28 DIAGNOSIS — I479 Paroxysmal tachycardia, unspecified: Secondary | ICD-10-CM | POA: Diagnosis not present

## 2020-04-28 DIAGNOSIS — K5901 Slow transit constipation: Secondary | ICD-10-CM | POA: Diagnosis not present

## 2020-04-28 DIAGNOSIS — I951 Orthostatic hypotension: Secondary | ICD-10-CM | POA: Diagnosis not present

## 2020-04-28 DIAGNOSIS — N3941 Urge incontinence: Secondary | ICD-10-CM | POA: Diagnosis not present

## 2020-04-28 DIAGNOSIS — Z789 Other specified health status: Secondary | ICD-10-CM | POA: Diagnosis not present

## 2020-04-29 ENCOUNTER — Ambulatory Visit: Payer: Medicare Other | Admitting: Cardiovascular Disease

## 2020-04-29 ENCOUNTER — Telehealth: Payer: Self-pay | Admitting: Internal Medicine

## 2020-04-29 DIAGNOSIS — Z789 Other specified health status: Secondary | ICD-10-CM | POA: Diagnosis not present

## 2020-04-29 DIAGNOSIS — H353132 Nonexudative age-related macular degeneration, bilateral, intermediate dry stage: Secondary | ICD-10-CM | POA: Diagnosis not present

## 2020-04-29 DIAGNOSIS — H18593 Other hereditary corneal dystrophies, bilateral: Secondary | ICD-10-CM | POA: Diagnosis not present

## 2020-04-29 DIAGNOSIS — I479 Paroxysmal tachycardia, unspecified: Secondary | ICD-10-CM | POA: Diagnosis not present

## 2020-04-29 DIAGNOSIS — H35013 Changes in retinal vascular appearance, bilateral: Secondary | ICD-10-CM | POA: Diagnosis not present

## 2020-04-29 DIAGNOSIS — I951 Orthostatic hypotension: Secondary | ICD-10-CM | POA: Diagnosis not present

## 2020-04-29 DIAGNOSIS — H35033 Hypertensive retinopathy, bilateral: Secondary | ICD-10-CM | POA: Diagnosis not present

## 2020-04-29 DIAGNOSIS — I2510 Atherosclerotic heart disease of native coronary artery without angina pectoris: Secondary | ICD-10-CM | POA: Diagnosis not present

## 2020-04-29 MED ORDER — SIMVASTATIN 20 MG PO TABS: 20.0000 mg | ORAL_TABLET | Freq: Every day | ORAL | 3 refills | Status: DC

## 2020-04-29 MED ORDER — RANOLAZINE ER 500 MG PO TB12: 500.0000 mg | ORAL_TABLET | Freq: Two times a day (BID) | ORAL | 5 refills | Status: DC

## 2020-04-29 NOTE — Telephone Encounter (Signed)
Pt c/o medication issue:  1. Name of Medication: isosorbide mononitrate (IMDUR) 30 MG 24 hr tablet  2. How are you currently taking this medication (dosage and times per day)?  1 tablet daily  3. Are you having a reaction (difficulty breathing--STAT)? No   4. What is your medication issue? Earmon Phoenix NP, at [facility] states the patient started the medication 04/26/2020 and is now orthostatic. She states his BP is 86/70 HR 121 with dizziness sitting up in the chair. She states they need direction and that they are not able to get him out of bed. Phone: [phone]

## 2020-04-29 NOTE — Telephone Encounter (Signed)
Ronnell Freshwater, NP notified by phone of decrease in zocor to 20 mg PO QD.

## 2020-04-29 NOTE — Telephone Encounter (Signed)
I spoke with Tyrone Phoenix, NP regarding orthostatic hypotension.  She expressed that patient has had orthostatic hypotension and dizziness since 04/28/20.  SBP in 80's with associated tachycardia.  Per NP pt refuses to go to the ED for evaluation.  Pt was seen by Tyrone Sells, MD on 04/26/20 at which time metoprolol was dc'd and imdur 30 mg PO QD was started. Tyrone Emery, NP expressed that prior to this appointment pt was not on any BP medications and his BP was WNL HR 70-80's at rest and 110's with ambulation, HR decreased with rest. He was able to participate in PT without complication.  Pt has been unable to participate in PT since 04/27/20. Daughter is concerned that stopping imdur will cause pt to have a heart attack. According to NP, last BP 113/74.  Patient was given a dose of metoprolol at facility for HR at some point date/ time not given or clearly stated.   Tyrone Emery, NP given Dr. Gasper Roman recommendations stop all cardiac meds: to include metoprolol, NTG, and imdur.  Start ranexa 500 mg PO BID.  Ranexa should not cause hypotension and will help with CP and CAD. Pt should go to ED for evaluation if he continues to have hypotension.   NP suggested that patient have metoprolol 25 mg PO QD for HR >120.  Dr. Gasper Roman is not agreeable to this plan.  Told to call the office with further questions or concerns.  Simvastatin decreased to 20 mg PO QD per Dr. Gasper Roman.  Medication decreased d/t addition of ranexa.

## 2020-05-02 DIAGNOSIS — N401 Enlarged prostate with lower urinary tract symptoms: Secondary | ICD-10-CM | POA: Diagnosis not present

## 2020-05-02 DIAGNOSIS — K219 Gastro-esophageal reflux disease without esophagitis: Secondary | ICD-10-CM | POA: Diagnosis not present

## 2020-05-02 DIAGNOSIS — Z8616 Personal history of COVID-19: Secondary | ICD-10-CM | POA: Diagnosis not present

## 2020-05-02 DIAGNOSIS — M069 Rheumatoid arthritis, unspecified: Secondary | ICD-10-CM | POA: Diagnosis not present

## 2020-05-02 DIAGNOSIS — M6281 Muscle weakness (generalized): Secondary | ICD-10-CM | POA: Diagnosis not present

## 2020-05-02 DIAGNOSIS — J189 Pneumonia, unspecified organism: Secondary | ICD-10-CM | POA: Diagnosis not present

## 2020-05-02 DIAGNOSIS — R2681 Unsteadiness on feet: Secondary | ICD-10-CM | POA: Diagnosis not present

## 2020-05-02 DIAGNOSIS — E782 Mixed hyperlipidemia: Secondary | ICD-10-CM | POA: Diagnosis not present

## 2020-05-02 DIAGNOSIS — J449 Chronic obstructive pulmonary disease, unspecified: Secondary | ICD-10-CM | POA: Diagnosis not present

## 2020-05-02 DIAGNOSIS — D649 Anemia, unspecified: Secondary | ICD-10-CM | POA: Diagnosis not present

## 2020-05-02 DIAGNOSIS — I2510 Atherosclerotic heart disease of native coronary artery without angina pectoris: Secondary | ICD-10-CM | POA: Diagnosis not present

## 2020-05-02 DIAGNOSIS — N183 Chronic kidney disease, stage 3 unspecified: Secondary | ICD-10-CM | POA: Diagnosis not present

## 2020-05-03 ENCOUNTER — Other Ambulatory Visit: Payer: Self-pay | Admitting: *Deleted

## 2020-05-03 NOTE — Patient Outreach (Signed)
[facility] Post- Acute Care Coordinator follow up. Member screened for potential [facility] care coordination needs.   [name] remains in [facility]. Telephone call made to [name] [phone] to discuss [facility] follow up. Patient identifiers confirmed.   [name] reports his plan is to return home post SNF. Briefly discussed that his PCP office has [facility] embedded care management team. Discussed that [name] has had orthostatic hypotension. Discussed that Probation officer can make referral to [facility] embedded team for additional follow up and/or Remote Health for home visits due to his recent changes in medications and orthostatic hypotension.   [name] asks that writer contact him at a later time as he was about to get his bp taken at the time. Writer offered to contact his daughter/DPR Tyrone Roman to discuss further. [name] declined.  Will plan to contact [name] at the end of the week to assess for [facility] care coordination needs and possible Remote Health services.   Will collaborate with SNF SW about anticipated transition plans and date.    Tyrone Rolling, MSN, RN,BSN [place] Acute Care Coordinator [phone] [facility]) [phone]  (Toll free office)

## 2020-05-04 ENCOUNTER — Telehealth (HOSPITAL_COMMUNITY): Payer: Self-pay

## 2020-05-04 ENCOUNTER — Other Ambulatory Visit: Payer: Self-pay | Admitting: Family Medicine

## 2020-05-04 ENCOUNTER — Other Ambulatory Visit: Payer: Self-pay | Admitting: *Deleted

## 2020-05-04 DIAGNOSIS — R008 Other abnormalities of heart beat: Secondary | ICD-10-CM | POA: Diagnosis not present

## 2020-05-04 DIAGNOSIS — J984 Other disorders of lung: Secondary | ICD-10-CM | POA: Diagnosis not present

## 2020-05-04 DIAGNOSIS — W1789XA Other fall from one level to another, initial encounter: Secondary | ICD-10-CM | POA: Diagnosis not present

## 2020-05-04 DIAGNOSIS — M0689 Other specified rheumatoid arthritis, multiple sites: Secondary | ICD-10-CM | POA: Diagnosis not present

## 2020-05-04 DIAGNOSIS — I951 Orthostatic hypotension: Secondary | ICD-10-CM | POA: Diagnosis not present

## 2020-05-04 DIAGNOSIS — R4189 Other symptoms and signs involving cognitive functions and awareness: Secondary | ICD-10-CM | POA: Diagnosis not present

## 2020-05-04 DIAGNOSIS — I208 Other forms of angina pectoris: Secondary | ICD-10-CM | POA: Diagnosis not present

## 2020-05-04 NOTE — Telephone Encounter (Signed)
Detailed instructions left on the patient's answering machine. Asked to call back with any questions. S.Camani Sesay EMTP 

## 2020-05-04 NOTE — Patient Outreach (Signed)
[place] Coordinator follow up. Member screened for potential [facility] Care Management needs.  Communication sent to [facility] SW to inquire about anticipated transition date and plan.   Will continue to follow and plan outreach to member again as appropriate.    Marthenia Rolling, MSN, RN,BSN [place] Acute Care Coordinator [phone] [facility]) [phone]  (Toll free office)

## 2020-05-05 ENCOUNTER — Encounter (HOSPITAL_COMMUNITY): Payer: Self-pay

## 2020-05-05 ENCOUNTER — Other Ambulatory Visit: Payer: Self-pay | Admitting: *Deleted

## 2020-05-05 ENCOUNTER — Other Ambulatory Visit: Payer: Self-pay

## 2020-05-05 ENCOUNTER — Ambulatory Visit (HOSPITAL_COMMUNITY): Payer: Medicare Other

## 2020-05-05 DIAGNOSIS — I25118 Atherosclerotic heart disease of native coronary artery with other forms of angina pectoris: Secondary | ICD-10-CM

## 2020-05-05 MED ORDER — TECHNETIUM TC 99M TETROFOSMIN IV KIT
10.1000 | PACK | Freq: Once | INTRAVENOUS | Status: AC | PRN
  Administered 2020-05-05: 10.1 via INTRAVENOUS
  Filled 2020-05-05: qty 11

## 2020-05-05 NOTE — Patient Outreach (Signed)
[place] Coordinator follow up. Member screened for potential [facility] care coordination needs.   Update received from [facility] indicating member continues to work with therapy. Mr. Surette recently had a fall in SNF. Transition date not set yet.   Will continue to collaborate with facility SW regarding transition plans/date. Will plan outreach to Mr. Keenum again closer to SNF transition.  Mr. Haughey's PCP has [facility] embedded care coordination team. Will make appropriate [facility] referrals as appropriate.     Marthenia Rolling, MSN, RN,BSN [place] Acute Care Coordinator [phone] [facility]) [phone]  (Toll free office)

## 2020-05-06 ENCOUNTER — Telehealth (HOSPITAL_COMMUNITY): Payer: Self-pay

## 2020-05-06 DIAGNOSIS — Z0189 Encounter for other specified special examinations: Secondary | ICD-10-CM | POA: Diagnosis not present

## 2020-05-06 DIAGNOSIS — I479 Paroxysmal tachycardia, unspecified: Secondary | ICD-10-CM | POA: Diagnosis not present

## 2020-05-06 DIAGNOSIS — G901 Familial dysautonomia [Riley-Day]: Secondary | ICD-10-CM | POA: Diagnosis not present

## 2020-05-06 NOTE — Telephone Encounter (Signed)
Called and spoke with pt in regards to [place], pt stated he is interested at this time. He is still inpatient rehab and is not sure when he will be leaving. Went over [employer].

## 2020-05-06 NOTE — Telephone Encounter (Signed)
Pt insurance is active and benefits verified through Medicare A/B. Co-pay $0.00, DED $233.00/$233.00 met, out of pocket $0.00/$0.00 met, co-insurance 20%. No pre-authorization required. 05/06/20  2ndary insurance is active and benefits verified through [place]. Co-pay $0.00, DED $0.00/$0.00 met, out of pocket $0.00/$0.00 met, co-insurance 0%. No pre-authorization required. 05/06/20

## 2020-05-08 ENCOUNTER — Other Ambulatory Visit: Payer: Self-pay | Admitting: Family Medicine

## 2020-05-09 ENCOUNTER — Encounter (HOSPITAL_COMMUNITY): Payer: Self-pay | Admitting: Internal Medicine

## 2020-05-09 ENCOUNTER — Emergency Department (HOSPITAL_COMMUNITY): Payer: Medicare Other

## 2020-05-09 ENCOUNTER — Other Ambulatory Visit: Payer: Self-pay

## 2020-05-09 ENCOUNTER — Inpatient Hospital Stay (HOSPITAL_COMMUNITY)
Admission: EM | Admit: 2020-05-09 | Discharge: 2020-05-18 | DRG: 291 | Disposition: A | Discharge status: Home Health | Payer: Medicare Other | Attending: Internal Medicine | Admitting: Internal Medicine

## 2020-05-09 DIAGNOSIS — J189 Pneumonia, unspecified organism: Secondary | ICD-10-CM | POA: Diagnosis not present

## 2020-05-09 DIAGNOSIS — J90 Pleural effusion, not elsewhere classified: Secondary | ICD-10-CM | POA: Diagnosis not present

## 2020-05-09 DIAGNOSIS — Z66 Do not resuscitate: Secondary | ICD-10-CM | POA: Diagnosis not present

## 2020-05-09 DIAGNOSIS — Z8042 Family history of malignant neoplasm of prostate: Secondary | ICD-10-CM

## 2020-05-09 DIAGNOSIS — M47816 Spondylosis without myelopathy or radiculopathy, lumbar region: Secondary | ICD-10-CM | POA: Diagnosis not present

## 2020-05-09 DIAGNOSIS — J441 Chronic obstructive pulmonary disease with (acute) exacerbation: Secondary | ICD-10-CM

## 2020-05-09 DIAGNOSIS — E860 Dehydration: Secondary | ICD-10-CM | POA: Diagnosis present

## 2020-05-09 DIAGNOSIS — U099 Post covid-19 condition, unspecified: Secondary | ICD-10-CM | POA: Diagnosis not present

## 2020-05-09 DIAGNOSIS — I2510 Atherosclerotic heart disease of native coronary artery without angina pectoris: Secondary | ICD-10-CM | POA: Diagnosis present

## 2020-05-09 DIAGNOSIS — R5381 Other malaise: Secondary | ICD-10-CM | POA: Diagnosis present

## 2020-05-09 DIAGNOSIS — N1832 Chronic kidney disease, stage 3b: Secondary | ICD-10-CM | POA: Diagnosis present

## 2020-05-09 DIAGNOSIS — Z7952 Long term (current) use of systemic steroids: Secondary | ICD-10-CM

## 2020-05-09 DIAGNOSIS — J440 Chronic obstructive pulmonary disease with acute lower respiratory infection: Secondary | ICD-10-CM | POA: Diagnosis present

## 2020-05-09 DIAGNOSIS — G9341 Metabolic encephalopathy: Secondary | ICD-10-CM | POA: Diagnosis present

## 2020-05-09 DIAGNOSIS — N400 Enlarged prostate without lower urinary tract symptoms: Secondary | ICD-10-CM | POA: Diagnosis present

## 2020-05-09 DIAGNOSIS — R509 Fever, unspecified: Secondary | ICD-10-CM

## 2020-05-09 DIAGNOSIS — I4510 Unspecified right bundle-branch block: Secondary | ICD-10-CM | POA: Diagnosis present

## 2020-05-09 DIAGNOSIS — M069 Rheumatoid arthritis, unspecified: Secondary | ICD-10-CM | POA: Diagnosis present

## 2020-05-09 DIAGNOSIS — Z7189 Other specified counseling: Secondary | ICD-10-CM | POA: Diagnosis not present

## 2020-05-09 DIAGNOSIS — U071 COVID-19: Secondary | ICD-10-CM | POA: Diagnosis not present

## 2020-05-09 DIAGNOSIS — M48061 Spinal stenosis, lumbar region without neurogenic claudication: Secondary | ICD-10-CM | POA: Diagnosis not present

## 2020-05-09 DIAGNOSIS — Z87891 Personal history of nicotine dependence: Secondary | ICD-10-CM

## 2020-05-09 DIAGNOSIS — J019 Acute sinusitis, unspecified: Secondary | ICD-10-CM | POA: Diagnosis not present

## 2020-05-09 DIAGNOSIS — M4804 Spinal stenosis, thoracic region: Secondary | ICD-10-CM | POA: Diagnosis not present

## 2020-05-09 DIAGNOSIS — I5033 Acute on chronic diastolic (congestive) heart failure: Secondary | ICD-10-CM | POA: Diagnosis not present

## 2020-05-09 DIAGNOSIS — E785 Hyperlipidemia, unspecified: Secondary | ICD-10-CM | POA: Diagnosis present

## 2020-05-09 DIAGNOSIS — J9601 Acute respiratory failure with hypoxia: Secondary | ICD-10-CM | POA: Diagnosis not present

## 2020-05-09 DIAGNOSIS — M47814 Spondylosis without myelopathy or radiculopathy, thoracic region: Secondary | ICD-10-CM | POA: Diagnosis not present

## 2020-05-09 DIAGNOSIS — R918 Other nonspecific abnormal finding of lung field: Secondary | ICD-10-CM | POA: Diagnosis not present

## 2020-05-09 DIAGNOSIS — R251 Tremor, unspecified: Secondary | ICD-10-CM | POA: Diagnosis not present

## 2020-05-09 DIAGNOSIS — Z515 Encounter for palliative care: Secondary | ICD-10-CM | POA: Diagnosis not present

## 2020-05-09 DIAGNOSIS — R0602 Shortness of breath: Secondary | ICD-10-CM | POA: Diagnosis not present

## 2020-05-09 DIAGNOSIS — R053 Chronic cough: Secondary | ICD-10-CM | POA: Diagnosis not present

## 2020-05-09 DIAGNOSIS — Z800 Family history of malignant neoplasm of digestive organs: Secondary | ICD-10-CM

## 2020-05-09 DIAGNOSIS — I82461 Acute embolism and thrombosis of right calf muscular vein: Secondary | ICD-10-CM | POA: Diagnosis present

## 2020-05-09 DIAGNOSIS — Z955 Presence of coronary angioplasty implant and graft: Secondary | ICD-10-CM

## 2020-05-09 DIAGNOSIS — I2699 Other pulmonary embolism without acute cor pulmonale: Secondary | ICD-10-CM | POA: Diagnosis not present

## 2020-05-09 DIAGNOSIS — Z8371 Family history of colonic polyps: Secondary | ICD-10-CM

## 2020-05-09 DIAGNOSIS — I252 Old myocardial infarction: Secondary | ICD-10-CM

## 2020-05-09 DIAGNOSIS — R7303 Prediabetes: Secondary | ICD-10-CM | POA: Diagnosis present

## 2020-05-09 DIAGNOSIS — Z87442 Personal history of urinary calculi: Secondary | ICD-10-CM

## 2020-05-09 DIAGNOSIS — Z8261 Family history of arthritis: Secondary | ICD-10-CM

## 2020-05-09 DIAGNOSIS — R6510 Systemic inflammatory response syndrome (SIRS) of non-infectious origin without acute organ dysfunction: Secondary | ICD-10-CM | POA: Diagnosis not present

## 2020-05-09 DIAGNOSIS — A419 Sepsis, unspecified organism: Secondary | ICD-10-CM | POA: Diagnosis not present

## 2020-05-09 DIAGNOSIS — Z87898 Personal history of other specified conditions: Secondary | ICD-10-CM

## 2020-05-09 DIAGNOSIS — I82409 Acute embolism and thrombosis of unspecified deep veins of unspecified lower extremity: Secondary | ICD-10-CM | POA: Diagnosis not present

## 2020-05-09 DIAGNOSIS — I208 Other forms of angina pectoris: Secondary | ICD-10-CM | POA: Diagnosis not present

## 2020-05-09 DIAGNOSIS — M2578 Osteophyte, vertebrae: Secondary | ICD-10-CM | POA: Diagnosis not present

## 2020-05-09 DIAGNOSIS — Z7982 Long term (current) use of aspirin: Secondary | ICD-10-CM

## 2020-05-09 DIAGNOSIS — R739 Hyperglycemia, unspecified: Secondary | ICD-10-CM | POA: Diagnosis not present

## 2020-05-09 DIAGNOSIS — I2693 Single subsegmental pulmonary embolism without acute cor pulmonale: Secondary | ICD-10-CM | POA: Diagnosis not present

## 2020-05-09 DIAGNOSIS — J449 Chronic obstructive pulmonary disease, unspecified: Secondary | ICD-10-CM

## 2020-05-09 DIAGNOSIS — R000 Tachycardia, unspecified: Secondary | ICD-10-CM | POA: Diagnosis not present

## 2020-05-09 DIAGNOSIS — I130 Hypertensive heart and chronic kidney disease with heart failure and stage 1 through stage 4 chronic kidney disease, or unspecified chronic kidney disease: Secondary | ICD-10-CM | POA: Diagnosis not present

## 2020-05-09 DIAGNOSIS — M4699 Unspecified inflammatory spondylopathy, multiple sites in spine: Secondary | ICD-10-CM | POA: Diagnosis not present

## 2020-05-09 DIAGNOSIS — R059 Cough, unspecified: Secondary | ICD-10-CM | POA: Diagnosis not present

## 2020-05-09 DIAGNOSIS — I82442 Acute embolism and thrombosis of left tibial vein: Secondary | ICD-10-CM | POA: Diagnosis present

## 2020-05-09 DIAGNOSIS — Z8249 Family history of ischemic heart disease and other diseases of the circulatory system: Secondary | ICD-10-CM

## 2020-05-09 DIAGNOSIS — R29898 Other symptoms and signs involving the musculoskeletal system: Secondary | ICD-10-CM

## 2020-05-09 DIAGNOSIS — Z7951 Long term (current) use of inhaled steroids: Secondary | ICD-10-CM

## 2020-05-09 DIAGNOSIS — Z0184 Encounter for antibody response examination: Secondary | ICD-10-CM

## 2020-05-09 DIAGNOSIS — M5124 Other intervertebral disc displacement, thoracic region: Secondary | ICD-10-CM | POA: Diagnosis not present

## 2020-05-09 DIAGNOSIS — R404 Transient alteration of awareness: Secondary | ICD-10-CM | POA: Diagnosis not present

## 2020-05-09 DIAGNOSIS — K760 Fatty (change of) liver, not elsewhere classified: Secondary | ICD-10-CM | POA: Diagnosis not present

## 2020-05-09 DIAGNOSIS — Z20822 Contact with and (suspected) exposure to covid-19: Secondary | ICD-10-CM | POA: Diagnosis not present

## 2020-05-09 DIAGNOSIS — M47812 Spondylosis without myelopathy or radiculopathy, cervical region: Secondary | ICD-10-CM | POA: Diagnosis not present

## 2020-05-09 DIAGNOSIS — M25571 Pain in right ankle and joints of right foot: Secondary | ICD-10-CM | POA: Diagnosis present

## 2020-05-09 DIAGNOSIS — Z888 Allergy status to other drugs, medicaments and biological substances status: Secondary | ICD-10-CM

## 2020-05-09 DIAGNOSIS — I82412 Acute embolism and thrombosis of left femoral vein: Secondary | ICD-10-CM | POA: Diagnosis present

## 2020-05-09 DIAGNOSIS — D849 Immunodeficiency, unspecified: Secondary | ICD-10-CM | POA: Diagnosis present

## 2020-05-09 DIAGNOSIS — G9389 Other specified disorders of brain: Secondary | ICD-10-CM | POA: Diagnosis not present

## 2020-05-09 DIAGNOSIS — Z833 Family history of diabetes mellitus: Secondary | ICD-10-CM

## 2020-05-09 DIAGNOSIS — G319 Degenerative disease of nervous system, unspecified: Secondary | ICD-10-CM | POA: Diagnosis not present

## 2020-05-09 DIAGNOSIS — I82402 Acute embolism and thrombosis of unspecified deep veins of left lower extremity: Secondary | ICD-10-CM | POA: Diagnosis not present

## 2020-05-09 DIAGNOSIS — J984 Other disorders of lung: Secondary | ICD-10-CM | POA: Diagnosis not present

## 2020-05-09 DIAGNOSIS — R008 Other abnormalities of heart beat: Secondary | ICD-10-CM | POA: Diagnosis not present

## 2020-05-09 DIAGNOSIS — J811 Chronic pulmonary edema: Secondary | ICD-10-CM | POA: Diagnosis not present

## 2020-05-09 DIAGNOSIS — M4319 Spondylolisthesis, multiple sites in spine: Secondary | ICD-10-CM | POA: Diagnosis not present

## 2020-05-09 DIAGNOSIS — G8929 Other chronic pain: Secondary | ICD-10-CM | POA: Diagnosis not present

## 2020-05-09 DIAGNOSIS — Z808 Family history of malignant neoplasm of other organs or systems: Secondary | ICD-10-CM

## 2020-05-09 DIAGNOSIS — G9340 Encephalopathy, unspecified: Secondary | ICD-10-CM | POA: Diagnosis not present

## 2020-05-09 DIAGNOSIS — Z881 Allergy status to other antibiotic agents status: Secondary | ICD-10-CM

## 2020-05-09 DIAGNOSIS — M545 Low back pain, unspecified: Secondary | ICD-10-CM | POA: Diagnosis not present

## 2020-05-09 DIAGNOSIS — M4802 Spinal stenosis, cervical region: Secondary | ICD-10-CM | POA: Diagnosis not present

## 2020-05-09 DIAGNOSIS — Z79899 Other long term (current) drug therapy: Secondary | ICD-10-CM

## 2020-05-09 LAB — CBC WITH DIFFERENTIAL/PLATELET
Abs Immature Granulocytes: 0.19 10*3/uL — ABNORMAL HIGH (ref 0.00–0.07)
Basophils Absolute: 0 10*3/uL (ref 0.0–0.1)
Basophils Relative: 0 %
Eosinophils Absolute: 0.2 10*3/uL (ref 0.0–0.5)
Eosinophils Relative: 2 %
HCT: 35.9 % — ABNORMAL LOW (ref 39.0–52.0)
Hemoglobin: 11.3 g/dL — ABNORMAL LOW (ref 13.0–17.0)
Immature Granulocytes: 2 %
Lymphocytes Relative: 5 %
Lymphs Abs: 0.4 10*3/uL — ABNORMAL LOW (ref 0.7–4.0)
MCH: 28.2 pg (ref 26.0–34.0)
MCHC: 31.5 g/dL (ref 30.0–36.0)
MCV: 89.5 fL (ref 80.0–100.0)
Monocytes Absolute: 0.5 10*3/uL (ref 0.1–1.0)
Monocytes Relative: 5 %
Neutro Abs: 7.1 10*3/uL (ref 1.7–7.7)
Neutrophils Relative %: 86 %
Platelets: 187 10*3/uL (ref 150–400)
RBC: 4.01 MIL/uL — ABNORMAL LOW (ref 4.22–5.81)
RDW: 18.2 % — ABNORMAL HIGH (ref 11.5–15.5)
WBC: 8.3 10*3/uL (ref 4.0–10.5)
nRBC: 0 % (ref 0.0–0.2)

## 2020-05-09 LAB — COMPREHENSIVE METABOLIC PANEL
ALT: 22 U/L (ref 0–44)
AST: 44 U/L — ABNORMAL HIGH (ref 15–41)
Albumin: 2.4 g/dL — ABNORMAL LOW (ref 3.5–5.0)
Alkaline Phosphatase: 101 U/L (ref 38–126)
Anion gap: 13 (ref 5–15)
BUN: 19 mg/dL (ref 8–23)
CO2: 22 mmol/L (ref 22–32)
Calcium: 9.8 mg/dL (ref 8.9–10.3)
Chloride: 101 mmol/L (ref 98–111)
Creatinine, Ser: 1.48 mg/dL — ABNORMAL HIGH (ref 0.61–1.24)
GFR, Estimated: 49 mL/min — ABNORMAL LOW (ref >60)
Glucose, Bld: 128 mg/dL — ABNORMAL HIGH (ref 70–99)
Potassium: 4.4 mmol/L (ref 3.5–5.1)
Sodium: 136 mmol/L (ref 135–145)
Total Bilirubin: 1.5 mg/dL — ABNORMAL HIGH (ref 0.3–1.2)
Total Protein: 5.8 g/dL — ABNORMAL LOW (ref 6.5–8.1)

## 2020-05-09 LAB — LACTIC ACID, PLASMA
Lactic Acid, Venous: 3.3 mmol/L (ref 0.5–1.9)
Lactic Acid, Venous: 1.6 mmol/L (ref 0.5–1.9)

## 2020-05-09 LAB — APTT: aPTT: 34 seconds (ref 24–36)

## 2020-05-09 LAB — PROTIME-INR
INR: 1.2 (ref 0.8–1.2)
Prothrombin Time: 14.6 seconds (ref 11.4–15.2)

## 2020-05-09 LAB — PROCALCITONIN: Procalcitonin: <0.1 ng/mL

## 2020-05-09 LAB — RESP PANEL BY RT-PCR (FLU A&B, COVID) ARPGX2
Influenza A by PCR: NEGATIVE
Influenza B by PCR: NEGATIVE
SARS Coronavirus 2 by RT PCR: POSITIVE — AB

## 2020-05-09 LAB — MRSA PCR SCREENING: MRSA by PCR: POSITIVE — AB

## 2020-05-09 MED ORDER — SIMVASTATIN 20 MG PO TABS
20.0000 mg | ORAL_TABLET | Freq: Every day | ORAL | Status: DC
  Administered 2020-05-10 – 2020-05-18 (×9): 20 mg via ORAL
  Filled 2020-05-09 (×9): qty 1

## 2020-05-09 MED ORDER — ACETAMINOPHEN 500 MG PO TABS
1000.0000 mg | ORAL_TABLET | Freq: Once | ORAL | Status: AC
  Administered 2020-05-09: 1000 mg via ORAL
  Filled 2020-05-09: qty 2

## 2020-05-09 MED ORDER — MOMETASONE FURO-FORMOTEROL FUM 200-5 MCG/ACT IN AERO
2.0000 | INHALATION_SPRAY | Freq: Two times a day (BID) | RESPIRATORY_TRACT | Status: DC
  Administered 2020-05-09 – 2020-05-18 (×16): 2 via RESPIRATORY_TRACT
  Filled 2020-05-09: qty 8.8

## 2020-05-09 MED ORDER — ONDANSETRON HCL 4 MG PO TABS: 4.0000 mg | ORAL_TABLET | Freq: Four times a day (QID) | ORAL | Status: DC | PRN

## 2020-05-09 MED ORDER — LACTATED RINGERS IV BOLUS (SEPSIS)
1000.0000 mL | Freq: Once | INTRAVENOUS | Status: AC
  Administered 2020-05-09: 1000 mL via INTRAVENOUS

## 2020-05-09 MED ORDER — LACTATED RINGERS IV SOLN: INTRAVENOUS | Status: DC

## 2020-05-09 MED ORDER — MUPIROCIN 2 % EX OINT
1.0000 "application " | TOPICAL_OINTMENT | Freq: Two times a day (BID) | CUTANEOUS | Status: AC
  Administered 2020-05-09 – 2020-05-14 (×10): 1 via NASAL
  Filled 2020-05-09 (×3): qty 22

## 2020-05-09 MED ORDER — ONDANSETRON HCL 4 MG/2ML IJ SOLN: 4.0000 mg | Freq: Four times a day (QID) | INTRAMUSCULAR | Status: DC | PRN

## 2020-05-09 MED ORDER — PANTOPRAZOLE SODIUM 40 MG PO TBEC
40.0000 mg | DELAYED_RELEASE_TABLET | Freq: Every day | ORAL | Status: DC
  Administered 2020-05-10 – 2020-05-18 (×9): 40 mg via ORAL
  Filled 2020-05-09 (×9): qty 1

## 2020-05-09 MED ORDER — ALBUTEROL SULFATE HFA 108 (90 BASE) MCG/ACT IN AERS
2.0000 | INHALATION_SPRAY | RESPIRATORY_TRACT | Status: DC | PRN
  Filled 2020-05-09: qty 6.7

## 2020-05-09 MED ORDER — VANCOMYCIN HCL 1500 MG/300ML IV SOLN
1500.0000 mg | INTRAVENOUS | Status: DC
  Filled 2020-05-09: qty 300

## 2020-05-09 MED ORDER — ASPIRIN 81 MG PO CHEW
81.0000 mg | CHEWABLE_TABLET | Freq: Every day | ORAL | Status: DC
  Administered 2020-05-10 – 2020-05-18 (×9): 81 mg via ORAL
  Filled 2020-05-09 (×9): qty 1

## 2020-05-09 MED ORDER — BUDESONIDE 0.5 MG/2ML IN SUSP
0.5000 mg | Freq: Two times a day (BID) | RESPIRATORY_TRACT | Status: DC
  Administered 2020-05-10 – 2020-05-11 (×3): 0.5 mg via RESPIRATORY_TRACT
  Filled 2020-05-09 (×4): qty 2

## 2020-05-09 MED ORDER — ENOXAPARIN SODIUM 40 MG/0.4ML ~~LOC~~ SOLN
40.0000 mg | SUBCUTANEOUS | Status: DC
  Administered 2020-05-09 – 2020-05-11 (×3): 40 mg via SUBCUTANEOUS
  Filled 2020-05-09 (×3): qty 0.4

## 2020-05-09 MED ORDER — UMECLIDINIUM BROMIDE 62.5 MCG/INH IN AEPB
1.0000 | INHALATION_SPRAY | Freq: Every day | RESPIRATORY_TRACT | Status: DC
  Administered 2020-05-10 – 2020-05-18 (×9): 1 via RESPIRATORY_TRACT
  Filled 2020-05-09 (×2): qty 7

## 2020-05-09 MED ORDER — LACTATED RINGERS IV SOLN
INTRAVENOUS | Status: DC
  Administered 2020-05-09: 150 mL/h via INTRAVENOUS

## 2020-05-09 MED ORDER — ACETAMINOPHEN 650 MG RE SUPP: 650.0000 mg | Freq: Four times a day (QID) | RECTAL | Status: DC | PRN

## 2020-05-09 MED ORDER — CHLORHEXIDINE GLUCONATE CLOTH 2 % EX PADS
6.0000 | MEDICATED_PAD | Freq: Every day | CUTANEOUS | Status: AC
  Administered 2020-05-10 – 2020-05-14 (×4): 6 via TOPICAL

## 2020-05-09 MED ORDER — SODIUM CHLORIDE 0.9 % IV SOLN
2.0000 g | Freq: Two times a day (BID) | INTRAVENOUS | Status: DC
  Administered 2020-05-10: 2 g via INTRAVENOUS
  Filled 2020-05-09: qty 2

## 2020-05-09 MED ORDER — VANCOMYCIN HCL 2000 MG/400ML IV SOLN
2000.0000 mg | Freq: Once | INTRAVENOUS | Status: AC
  Administered 2020-05-09: 2000 mg via INTRAVENOUS
  Filled 2020-05-09: qty 400

## 2020-05-09 MED ORDER — ACETAMINOPHEN 325 MG PO TABS
650.0000 mg | ORAL_TABLET | Freq: Four times a day (QID) | ORAL | Status: DC | PRN
  Administered 2020-05-09 – 2020-05-16 (×11): 650 mg via ORAL
  Filled 2020-05-09 (×14): qty 2

## 2020-05-09 MED ORDER — SODIUM CHLORIDE 0.9 % IV SOLN
2.0000 g | Freq: Once | INTRAVENOUS | Status: AC
  Administered 2020-05-09: 2 g via INTRAVENOUS
  Filled 2020-05-09: qty 2

## 2020-05-09 MED ORDER — SODIUM CHLORIDE 0.9% FLUSH
3.0000 mL | Freq: Two times a day (BID) | INTRAVENOUS | Status: DC
  Administered 2020-05-09 – 2020-05-18 (×14): 3 mL via INTRAVENOUS

## 2020-05-09 MED ORDER — MELATONIN 5 MG PO TABS
5.0000 mg | ORAL_TABLET | Freq: Every day | ORAL | Status: DC
  Administered 2020-05-10 – 2020-05-17 (×6): 5 mg via ORAL
  Filled 2020-05-09 (×10): qty 1

## 2020-05-09 NOTE — ED Provider Notes (Signed)
[facility] EMERGENCY DEPARTMENT Provider Note   CSN: [id number] Arrival date & time: 05/09/20  1148     History Chief Complaint  Patient presents with  . Tachycardia    Tyrone Roman is a 75 y.o. male with past medical history of BPH, CAD, COPD, diverticulosis, hyperlipidemia, hypertension brought in by EMS from [place] for evaluation of cough and SOB.  Patient reports he has been coughing for several days.  Cough is productive of phlegm.  No hemoptysis.  He states that his doctor was unhappy of how this was progressing and so he sent him to the emergency department.  He has COPD.  He has been using inhalers with no improvement.  He denies any chest pain, abdominal pain.  He does have some baseline history of trouble urinating.  No hematuria.  EM LEVEL 5 CAVEAT DUE TO ACUITY  The history is provided by the patient.       Past Medical History:  Diagnosis Date  . Allergy   . Barrett's esophageal ulceration   . BPH (benign prostatic hyperplasia)   . CAD (coronary artery disease)    sees Dr. Mar Daring  . Cataract    both eyes removed   . Colonic polyp   . Concussion with loss of consciousness of 30 minutes or less   . Contact dermatitis   . Contact with or exposure to venereal diseases   . COPD (chronic obstructive pulmonary disease) ([place])    sees Dr. Kara Mead   . Diverticulosis of colon   . Dysphagia   . GERD (gastroesophageal reflux disease)    past hx- on meds   . HNP (herniated nucleus pulposus), lumbar    recurrent  . Hyperlipidemia   . Hypertension   . Myocardial infarction [facility])    2011- stent placed  . Nephrolithiasis   . Rheumatoid arthritis [facility])    sees Dr. Gavin Pound   . SOB (shortness of breath)     Patient Active Problem List   Diagnosis Date Noted  . Sepsis due to pneumonia ([place]) 05/09/2020  . Post-COVID-19 syndrome manifesting as chronic cough 05/09/2020  . COPD without exacerbation ([place]) 05/09/2020  .  Essential hypertension 04/26/2020  . Acute hypoxemic respiratory failure due to COVID-19 ([place]) 04/03/2020  . Acute respiratory failure with hypoxia ([place]) 03/28/2020  . Physical deconditioning 03/26/2020  . Normocytic anemia 03/26/2020  . Severe sepsis ([place]) 03/25/2020  . Multifocal pneumonia 03/17/2020  . COPD with acute exacerbation ([place]) 03/17/2020  . Hypoxia 03/17/2020  . CAD (coronary artery disease) 03/17/2020  . Thrombocytopenia ([place]) 03/08/2020  . Stage 3b chronic kidney disease ([place]) 03/08/2020  . Pneumonia due to COVID-19 virus 02/29/2020  . Acute respiratory failure due to COVID-19 ([place]) 02/29/2020  . Closed nondisplaced fracture of shaft of third metacarpal bone of left hand 03/04/2019  . Wound infection after surgery 01/01/2018  . Medication monitoring encounter 01/01/2018  . HNP (herniated nucleus pulposus), lumbar 10/07/2017  . Lumbar herniated disc 10/07/2017  . Psoriasis of scalp 03/27/2016  . Rheumatoid arthritis ([place]) 03/25/2013  . GI bleeding 02/20/2012  . Dyspnea on exertion 09/08/2010  . COPD (chronic obstructive pulmonary disease) ([place]) 08/04/2010  . Bruising 08/04/2010  . TINNITUS 12/16/2009  . Dizziness and giddiness 12/16/2009  . NECK SPRAIN AND STRAIN 10/21/2009  . LUMBAR SPRAIN AND STRAIN 10/21/2009  . Hyperlipidemia 06/10/2009  . Coronary artery disease of native artery of native heart with stable angina pectoris ([place]) 06/10/2009  . GERD 06/10/2009  .  BARRETTS ESOPHAGUS 06/10/2009  . CONCUSSION WITH LOC OF 30 MINUTES OR LESS 02/28/2009  . NEPHROLITHIASIS 07/01/2007  . CONTACT DERMATITIS 12/12/2006  . BPH (benign prostatic hyperplasia) 11/04/2006  . COLONIC POLYPS 10/21/2003  . DIVERTICULOSIS, COLON 10/21/2003    Past Surgical History:  Procedure Laterality Date  . BACK SURGERY    . CARDIAC CATHETERIZATION N/A 01/19/2016   Procedure: Left Heart Cath and Coronary Angiography;  Surgeon: Burnell Blanks, MD;  Location: [facility];   Service: Cardiovascular;  Laterality: N/A;  . COLONOSCOPY  12/09/2014   per Dr. Silverio Decamp, adenomatous polyps, repeat 3 years   . CORONARY ANGIOPLASTY WITH STENT PLACEMENT    . ESOPHAGOGASTRODUODENOSCOPY (EGD) WITH ESOPHAGEAL DILATION  02/17/09   Barretts esophagus   . EYE SURGERY    . KNEE ARTHROSCOPY Left X 2  . LUMBAR LAMINECTOMY/DECOMPRESSION MICRODISCECTOMY Right 10/07/2017   Procedure: RIGHT LUMBAR ONE-TWO LAMINECTOMY WITH MICRODISCECTOMY;  Surgeon: Consuella Lose, MD;  Location: [facility];  Service: Neurosurgery;  Laterality: Right;  . LUMBAR LAMINECTOMY/DECOMPRESSION MICRODISCECTOMY N/A 12/06/2017   Procedure: RECURRENT MICRODISCECTOMY LUMBAR ONE - LUMBAR TWO;  Surgeon: Consuella Lose, MD;  Location: [facility];  Service: Neurosurgery;  Laterality: N/A;  . LUMBAR WOUND DEBRIDEMENT N/A 12/11/2017   Procedure: LUMBAR WOUND DEBRIDEMENT;  Surgeon: Consuella Lose, MD;  Location: [facility];  Service: Neurosurgery;  Laterality: N/A;  . POLYPECTOMY    . TONSILLECTOMY    . UPPER GASTROINTESTINAL ENDOSCOPY         Family History  Problem Relation Age of Onset  . Heart attack Father        cardiovascular disorder  . Arthritis Father        family hx  . Colon cancer Father        mets  . Prostate cancer Father        1st degree relative  . Arthritis Mother   . Colon polyps Mother   . Melanoma Mother   . Dementia Mother   . Diabetes Paternal Uncle   . Colon cancer Paternal Uncle   . Stomach cancer Paternal Uncle   . Melanoma Paternal Uncle   . Cancer Maternal Grandmother   . Skin cancer Daughter   . Colon polyps Sister   . Esophageal cancer Neg Hx   . Rectal cancer Neg Hx     Social History   Tobacco Use  . Smoking status: Former Smoker    Packs/day: 1.00    Years: 20.00    Pack years: 20.00    Types: Cigarettes    Quit date: 10/12/1988    Years since quitting: 31.5  . Smokeless tobacco: Never Used  . Tobacco comment: 1960's   Vaping Use  . Vaping Use: Never used   Substance Use Topics  . Alcohol use: Not Currently    Alcohol/week: 0.0 standard drinks    Comment: rare  . Drug use: No    Home Medications Prior to Admission medications   Medication Sig Start Date End Date Taking? Authorizing Provider  acetaminophen (TYLENOL) 325 MG tablet Take 650 mg by mouth every 6 (six) hours as needed for headache, mild pain or fever.   Yes [provider]  albuterol (VENTOLIN HFA) 108 (90 Base) MCG/ACT inhaler Inhale 2 puffs into the lungs every 4 (four) hours as needed for wheezing or shortness of breath. 03/21/20  Yes [name], MD  aspirin 81 MG chewable tablet Chew 1 tablet (81 mg total) by mouth daily. 04/07/20  Yes Thurnell Lose,  MD  budesonide (PULMICORT) 0.5 MG/2ML nebulizer solution Take 0.5 mg by nebulization 2 (two) times daily. 04/26/20  Yes [provider]  budesonide-formoterol (SYMBICORT) 160-4.5 MCG/ACT inhaler Inhale 2 puffs into the lungs in the morning and at bedtime. 04/12/20  Yes Margaretha Seeds, MD  Emollient [facility] INTENSIVE REPAIR EX) Apply 1 application topically 2 (two) times daily. Apply to feet and heels.   Yes [provider]  Lidocaine 4 % PTCH Apply 1 patch topically daily. Apply to hip.   Yes [provider]  melatonin 5 MG TABS Take 5 mg by mouth at bedtime.   Yes [provider]  Menthol (ICY HOT) 5 % PTCH Apply 1 patch topically 2 (two) times daily. Apply to lower back.   Yes [provider]  omeprazole (PRILOSEC) 40 MG capsule Take 1 capsule (40 mg total) by mouth daily. 05/11/19  Yes [name], MD  simvastatin (ZOCOR) 20 MG tablet Take 1 tablet (20 mg total) by mouth at bedtime. Patient taking differently: Take 20 mg by mouth daily. 04/29/20  Yes Chandrasekhar, Mahesh A, MD  Tiotropium Bromide Monohydrate (SPIRIVA RESPIMAT) 2.5 MCG/ACT AERS Inhale 2 puffs into the lungs daily. 04/12/20  Yes Margaretha Seeds, MD    Allergies    Cephalexin, Clarithromycin,  Certolizumab pegol, Tocilizumab, Doxycycline, Hydroxyzine, Lidoderm, and Pyrithione zinc  Review of Systems   Review of Systems  Constitutional: Negative for fever.  Respiratory: Positive for cough. Negative for shortness of breath.   Cardiovascular: Negative for chest pain.  Gastrointestinal: Negative for abdominal pain, nausea and vomiting.  Genitourinary: Negative for dysuria and hematuria.  Neurological: Negative for headaches.  All other systems reviewed and are negative.   Physical Exam Updated Vital Signs BP 118/83   Pulse 80   Temp 98.6 F (37 C) (Oral)   Resp 16   Ht 6\' 2"  (1.88 m)   Wt 88 kg   SpO2 94%   BMI 24.91 kg/m   Physical Exam Vitals and nursing note reviewed.  Constitutional:      Appearance: Normal appearance. He is well-developed. He is ill-appearing.  HENT:     Head: Normocephalic and atraumatic.  Eyes:     General: Lids are normal.     Conjunctiva/sclera: Conjunctivae normal.     Pupils: Pupils are equal, round, and reactive to light.  Cardiovascular:     Rate and Rhythm: Normal rate and regular rhythm.     Pulses: Normal pulses.     Heart sounds: Normal heart sounds. No murmur heard. No friction rub. No gallop.   Pulmonary:     Effort: Tachypnea present.     Breath sounds: Rales present.     Comments: Rales noted at bilateral bases to midlung fields, left greater than right.  Increased work of breathing, tachypnea noted.  No wheezing. Abdominal:     Palpations: Abdomen is soft. Abdomen is not rigid.     Tenderness: There is no abdominal tenderness. There is no guarding.     Comments: Abdomen is soft, non-distended, non-tender. No rigidity, No guarding. No peritoneal signs.  Musculoskeletal:        General: Normal range of motion.     Cervical back: Full passive range of motion without pain.  Skin:    General: Skin is warm and dry.     Capillary Refill: Capillary refill takes less than 2 seconds.  Neurological:     Mental Status: He is  alert and oriented to person, place, and time.  Psychiatric:        Speech: Speech normal.     ED Results / Procedures / Treatments   Labs (all labs ordered are listed, but only abnormal results are displayed) Labs Reviewed  RESP PANEL BY RT-PCR (FLU A&B, COVID) ARPGX2 - Abnormal; Notable for the following components:      Result Value   SARS Coronavirus 2 by RT PCR POSITIVE (*)    All other components within normal limits  LACTIC ACID, PLASMA - Abnormal; Notable for the following components:   Lactic Acid, Venous 3.3 (*)    All other components within normal limits  COMPREHENSIVE METABOLIC PANEL - Abnormal; Notable for the following components:   Glucose, Bld 128 (*)    Creatinine, Ser 1.48 (*)    Total Protein 5.8 (*)    Albumin 2.4 (*)    AST 44 (*)    Total Bilirubin 1.5 (*)    GFR, Estimated 49 (*)    All other components within normal limits  CBC WITH DIFFERENTIAL/PLATELET - Abnormal; Notable for the following components:   RBC 4.01 (*)    Hemoglobin 11.3 (*)    HCT 35.9 (*)    RDW 18.2 (*)    Lymphs Abs 0.4 (*)    Abs Immature Granulocytes 0.19 (*)    All other components within normal limits  CULTURE, BLOOD (ROUTINE X 2)  CULTURE, BLOOD (ROUTINE X 2)  URINE CULTURE  MRSA PCR SCREENING  PROTIME-INR  APTT  LACTIC ACID, PLASMA  PROCALCITONIN  URINALYSIS, ROUTINE W REFLEX MICROSCOPIC  LACTIC ACID, PLASMA  BASIC METABOLIC PANEL  CBC    EKG None  Radiology DG Chest Port 1 View  Result Date: 05/09/2020 CLINICAL DATA:  Possible sepsis Fever and cough EXAM: PORTABLE CHEST 1 VIEW COMPARISON:  04/04/2020 FINDINGS: Heart size within normal limits. No pulmonary vascular congestion. Interval improvement in aeration of the lungs with bilateral airspace opacities still remaining. IMPRESSION: Interval improvement in aeration of the lungs with bilateral opacities still remaining consistent with resolving pneumonia. Electronically Signed   By: Miachel Roux M.D.   On:  05/09/2020 12:56    Procedures .Critical Care Performed by: Volanda Napoleon, PA-C Authorized by: Volanda Napoleon, PA-C   Critical care provider statement:    Critical care time (minutes):  35   Critical care was necessary to treat or prevent imminent or life-threatening deterioration of the following conditions:  Sepsis   Critical care was time spent personally by me on the following activities:  Discussions with consultants, evaluation of patient's response to treatment, examination of patient, ordering and performing treatments and interventions, ordering and review of laboratory studies, ordering and review of radiographic studies, pulse oximetry, re-evaluation of patient's condition, obtaining history from patient or surrogate and review of old charts     Medications Ordered in ED Medications  ceFEPIme (MAXIPIME) 2 g in sodium chloride 0.9 % 100 mL IVPB (has no administration in time range)  vancomycin (VANCOREADY) IVPB 1500 mg/300 mL (has no administration in time range)  aspirin chewable tablet 81 mg (has no administration in time range)  simvastatin (ZOCOR) tablet 20 mg (has no administration in time range)  pantoprazole (PROTONIX) EC tablet 40 mg (has no administration in time range)  melatonin tablet 5 mg (has no administration in time range)  albuterol (VENTOLIN HFA) 108 (90 Base) MCG/ACT inhaler 2 puff (has no administration in time range)  budesonide (PULMICORT) nebulizer solution 0.5 mg (has no administration in time range)  mometasone-formoterol (DULERA) 200-5  MCG/ACT inhaler 2 puff (has no administration in time range)  umeclidinium bromide (INCRUSE ELLIPTA) 62.5 MCG/INH 1 puff (has no administration in time range)  enoxaparin (LOVENOX) injection 40 mg (has no administration in time range)  sodium chloride flush (NS) 0.9 % injection 3 mL (has no administration in time range)  lactated ringers infusion ( Intravenous New Bag/Given 05/09/20 1804)  acetaminophen (TYLENOL)  tablet 650 mg (has no administration in time range)    Or  acetaminophen (TYLENOL) suppository 650 mg (has no administration in time range)  ondansetron (ZOFRAN) tablet 4 mg (has no administration in time range)    Or  ondansetron (ZOFRAN) injection 4 mg (has no administration in time range)  lactated ringers bolus 1,000 mL (0 mLs Intravenous Stopped 05/09/20 1253)    And  lactated ringers bolus 1,000 mL (0 mLs Intravenous Stopped 05/09/20 1423)    And  lactated ringers bolus 1,000 mL (0 mLs Intravenous Stopped 05/09/20 1324)  ceFEPIme (MAXIPIME) 2 g in sodium chloride 0.9 % 100 mL IVPB (0 g Intravenous Stopped 05/09/20 1324)  vancomycin (VANCOREADY) IVPB 2000 mg/400 mL (0 mg Intravenous Stopped 05/09/20 1546)  acetaminophen (TYLENOL) tablet 1,000 mg (1,000 mg Oral Given 05/09/20 1335)    ED Course  I have reviewed the triage vital signs and the nursing notes.  Pertinent labs & imaging results that were available during my care of the patient were reviewed by me and considered in my medical decision making (see chart for details).    MDM Rules/Calculators/A&P                          76 year old male who presents for evaluation from [facility] rehab for cough, shortness of breath.  He states that his symptoms were not getting better, prompting his primary care doctor to send him to the emergency department.  On initial arrival, he is febrile, hypotensive, tachycardic, tachypneic with increased work of breathing.  On exam, lungs show rales.  He is satting 93% on 3 L of oxygen.  He normally does not wear any oxygen.  Benign abdominal exam.  Concern for sepsis secondary to respiratory infection.  Plan for labs, chest x-ray.  Will initiate 30 cc/kg fluid bolus.  Patient with no history of CHF.  Patient started on broad-spectrum antibiotics.  CMP shows BUN of 19, creatinine 1.48.  Lactic is 3.3.  His Covid is positive.  This is a PCR and he was positive for Covid last month.  Likely continuation.  CBC  hemoglobin 11.3.  Chest x-ray shows bilateral opacities remaining.  At this time, concern for sepsis secondary to pneumonia.  Patient started on broad-spectrum antibiotics and given fluids.  Will require admission.  I discussed with Dr. Lorin Mercy (hospitalist) who accepts patient for admission.  Portions of this note were generated with [employer]obby[employer]. Dictation errors may occur despite best attempts at proofreading.  Final Clinical Impression(s) / ED Diagnoses Final diagnoses:  Sepsis, due to unspecified organism, unspecified whether acute organ dysfunction present [facility])    Rx / DC Orders ED Discharge Orders    None       Desma Mcgregor 05/09/20 1917    Noemi Chapel, MD 05/13/20 1113

## 2020-05-09 NOTE — ED Provider Notes (Signed)
Medical screening examination/treatment/procedure(s) were conducted as a shared visit with non-physician practitioner(s) and myself.  I personally evaluated the patient during the encounter.  Clinical Impression:   Final diagnoses:  Sepsis, due to unspecified organism, unspecified whether acute organ dysfunction present [facility])    This patient is a very ill-appearing 75 year old male presenting with multiple complaints but primarily he is febrile tachycardic and hypotensive.  He does appear tachypneic and states that he has been coughing and short of breath today.  He also has some nighttime urinary frequency, he has a history of heart disease and is post to see his heart doctor tomorrow.  The patient was recently admitted to the hospital with sepsis in February, he was readmitted with altered mental status in February and comes back today with high fever over 102, tachycardic to 130, he has weak peripheral pulses, soft nontender abdomen, no edema, dry mucous membranes.  Labs will need to be initiated to find the course of the patient sepsis.  He is critically ill, IV fluids, 30 cc/kg, temperature control, broad-spectrum antibiotics until source is found.  I spoke with the patient's daughter who is trying to talk him into staying as he is being a little bit belligerent at this time     Noemi Chapel, MD 05/13/20 1113

## 2020-05-09 NOTE — Progress Notes (Addendum)
Pharmacy Antibiotic Note  Tyrone Roman is a 75 y.o. male admitted on 05/09/2020 with sepsis.  Pharmacy has been consulted for cefepime and vancomycin dosing. Scr 1.48 with current CrCl ~51 ml/min. Baseline Scr 1.1-1.2. WBC 8.3. Tmax 101.7.   Plan: Cefepime 2g q12h Vancomycin 2000mg  x1 loading dose followed by 1500mg  q24h (eAUC 507 using Scr 1.48) Monitor renal functions, cultures, infectious workup and clinical progression   Height: 6\' 2"  (188 cm) Weight: 88 kg (194 lb 0.1 oz) IBW/kg (Calculated) : 82.2  No data recorded.  No results for input(s): WBC, CREATININE, LATICACIDVEN, VANCOTROUGH, VANCOPEAK, VANCORANDOM, GENTTROUGH, GENTPEAK, GENTRANDOM, TOBRATROUGH, TOBRAPEAK, TOBRARND, AMIKACINPEAK, AMIKACINTROU, AMIKACIN in the last 168 hours.  CrCl cannot be calculated (Patient's most recent lab result is older than the maximum 21 days allowed.).    Allergies  Allergen Reactions  . Cephalexin Shortness Of Breath, Rash and Other (See Comments)    Tolerated Augmentin 2015 and 2017 (12-2017). Tolerated nafcillin 12/2017 PATIENT HAS HAD A PCN REACTION WITH IMMEDIATE RASH, FACIAL/TONGUE/THROAT SWELLING, SOB, OR LIGHTHEADEDNESS WITH HYPOTENSION:  #  #  YES  #  #  Has patient had a PCN reaction causing severe rash involving mucus membranes or skin necrosis: No Has patient had a PCN reaction that required hospitalization: No Has patient had a PCN reaction occurring within the last 10 years: No If all of the above answers are "NO", then may proceed  . Clarithromycin Shortness Of Breath and Rash  . Certolizumab Pegol Hives  . Tocilizumab Hives  . Doxycycline Rash  . Hydroxyzine Rash  . Lidoderm Other (See Comments)    "made me act weird"  . Pyrithione Zinc Rash    Antimicrobials this admission: Vancomycin 4/4 >>  Cefepime 4/4 >>  Dose adjustments this admission: N/a  Microbiology results: 4/4 Bcx 4/4 Ucx MRSA PCR:  Thank you for allowing pharmacy to be a part of this patient's  care.  Cristela Felt, PharmD Clinical Pharmacist  05/09/2020 12:26 PM

## 2020-05-09 NOTE — ED Notes (Signed)
Help get patient undressed on the monitor patient is resting with call bell in reach got patient a warm blanket

## 2020-05-09 NOTE — H&P (Signed)
History and Physical    Tyrone Roman SWF:[id number] DOB: 02-01-1946 DOA: 05/09/2020  PCP: [name], MD Consultants:  [facility] - rheumatology; [facility] - cardiology; Loanne Drilling - pulmonology Patient coming from:  [facility]; NOK: Daughter, [name], [phone]  Chief Complaint: Tachycardia  HPI: Tyrone Roman is a 75 y.o. male with medical history significant of RA; CAD s/p stent; HTN; HLD; COPD; and BPH presenting with tachycardia.  He can't remember what happened.  He came in "for testing".  He has been at [facility] several months in a row.  He was sent there.  He was in an ambulance going to [facility] to have his chest checked because the people at [facility] didn't have the facility to check it and the medical person there recommended that he be at [facility].  He has had some congestion.  +coughing a lot, productive.  When he "got there, I was admitted."   ED Course:  Sepsis, likely PNA.  Had PNA in February and went to [place] after.  Cough and congestion, doctor sent him here.  Had COVID during prior hospitalization.  Fever to 102, BP 90s, tachycardia, tachypnea.  Given IVF with improved BP.  Lactate 3.3.  COVID positive but within 90 days.  B lung opacities on CXR.  Hypoxia to 80s at PCP office.  Review of Systems: As per HPI; otherwise review of systems reviewed and negative.   Ambulatory Status:  Ambulates with a walker currently, in rehab  COVID Vaccine Status:  Complete plus booster  Past Medical History:  Diagnosis Date  . Allergy   . Barrett's esophageal ulceration   . BPH (benign prostatic hyperplasia)   . CAD (coronary artery disease)    sees Dr. Mar Daring  . Cataract    both eyes removed   . Colonic polyp   . Concussion with loss of consciousness of 30 minutes or less   . Contact dermatitis   . Contact with or exposure to venereal diseases   . COPD (chronic obstructive pulmonary disease) ([place])    sees Dr. Kara Mead   . Diverticulosis of colon   .  Dysphagia   . GERD (gastroesophageal reflux disease)    past hx- on meds   . HNP (herniated nucleus pulposus), lumbar    recurrent  . Hyperlipidemia   . Hypertension   . Myocardial infarction [facility])    2011- stent placed  . Nephrolithiasis   . Rheumatoid arthritis [facility])    sees Dr. Gavin Pound   . SOB (shortness of breath)     Past Surgical History:  Procedure Laterality Date  . BACK SURGERY    . CARDIAC CATHETERIZATION N/A 01/19/2016   Procedure: Left Heart Cath and Coronary Angiography;  Surgeon: Burnell Blanks, MD;  Location: [place] [facility];  Service: Cardiovascular;  Laterality: N/A;  . COLONOSCOPY  12/09/2014   per Dr. Silverio Decamp, adenomatous polyps, repeat 3 years   . CORONARY ANGIOPLASTY WITH STENT PLACEMENT    . ESOPHAGOGASTRODUODENOSCOPY (EGD) WITH ESOPHAGEAL DILATION  02/17/09   Barretts esophagus   . EYE SURGERY    . KNEE ARTHROSCOPY Left X 2  . LUMBAR LAMINECTOMY/DECOMPRESSION MICRODISCECTOMY Right 10/07/2017   Procedure: RIGHT LUMBAR ONE-TWO LAMINECTOMY WITH MICRODISCECTOMY;  Surgeon: Consuella Lose, MD;  Location: [place];  Service: Neurosurgery;  Laterality: Right;  . LUMBAR LAMINECTOMY/DECOMPRESSION MICRODISCECTOMY N/A 12/06/2017   Procedure: RECURRENT MICRODISCECTOMY LUMBAR ONE - LUMBAR TWO;  Surgeon: Consuella Lose, MD;  Location: [place];  Service: Neurosurgery;  Laterality: N/A;  . LUMBAR WOUND DEBRIDEMENT N/A 12/11/2017   Procedure: LUMBAR WOUND DEBRIDEMENT;  Surgeon: Consuella Lose, MD;  Location: [place];  Service: Neurosurgery;  Laterality: N/A;  . POLYPECTOMY    . TONSILLECTOMY    . UPPER GASTROINTESTINAL ENDOSCOPY      Social History   Socioeconomic History  . Marital status: Divorced    Spouse name: Not on file  . Number of children: 4  . Years of education: Not on file  . Highest education level: Not on file  Occupational History  . Occupation: ENGINEER    Employer: [employer]  Tobacco Use  . Smoking status: Former Smoker     Packs/day: 1.00    Years: 20.00    Pack years: 20.00    Types: Cigarettes    Quit date: 10/12/1988    Years since quitting: 31.5  . Smokeless tobacco: Never Used  . Tobacco comment: 1960's   Vaping Use  . Vaping Use: Never used  Substance and Sexual Activity  . Alcohol use: Not Currently    Alcohol/week: 0.0 standard drinks    Comment: rare  . Drug use: No  . Sexual activity: Not on file  Other Topics Concern  . 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       04/14/2018:   Lives alone, has girlfriend who visits, main source of emotional support   Has been struggling with recent hospitalizations/ill health following sepsis and back surgeries   Social Determinants of Health   Financial Resource Strain: Low Risk   . Difficulty of Paying Living Expenses: Not hard at all  Food Insecurity: No Food Insecurity  . Worried About Charity fundraiser in the Last Year: Never true  . Ran Out of Food in the Last Year: Never true  Transportation Needs: No Transportation Needs  . Lack of Transportation (Medical): No  . Lack of Transportation (Non-Medical): No  Physical Activity: Insufficiently Active  . Days of Exercise per Week: 2 days  . Minutes of Exercise per Session: 60 min  Stress: Stress Concern Present  . Feeling of Stress : To some extent  Social Connections: Moderately Isolated  . Frequency of Communication with Friends and Family: More than three times a week  . Frequency of Social Gatherings with Friends and Family: Once a week  . Attends Religious Services: Never  . Active Member of Clubs or Organizations: Yes  . Attends Archivist Meetings: More than 4 times per year  . Marital Status: Divorced  Human resources officer Violence: Not At Risk  . Fear of Current or Ex-Partner: No  . Emotionally Abused: No  . Physically Abused: No  . Sexually Abused: No    Allergies  Allergen  Reactions  . Cephalexin Shortness Of Breath, Rash and Other (See Comments)    Tolerated Augmentin 2015 and 2017 (12-2017). Tolerated nafcillin 12/2017 PATIENT HAS HAD A PCN REACTION WITH IMMEDIATE RASH, FACIAL/TONGUE/THROAT SWELLING, SOB, OR LIGHTHEADEDNESS WITH HYPOTENSION:  #  #  YES  #  #  Has patient had a PCN reaction causing severe rash involving mucus membranes or skin necrosis: No Has patient had a PCN reaction that required hospitalization: No Has patient had a PCN reaction occurring within the last 10 years: No If all of the above answers are "NO", then may proceed  . Clarithromycin Shortness Of Breath and Rash  . Certolizumab Pegol Hives  . Tocilizumab  Hives  . Doxycycline Rash  . Hydroxyzine Rash  . Lidoderm Other (See Comments)    "made me act weird"  . Pyrithione Zinc Rash    Family History  Problem Relation Age of Onset  . Heart attack Father        cardiovascular disorder  . Arthritis Father        family hx  . Colon cancer Father        mets  . Prostate cancer Father        1st degree relative  . Arthritis Mother   . Colon polyps Mother   . Melanoma Mother   . Dementia Mother   . Diabetes Paternal Uncle   . Colon cancer Paternal Uncle   . Stomach cancer Paternal Uncle   . Melanoma Paternal Uncle   . Cancer Maternal Grandmother   . Skin cancer Daughter   . Colon polyps Sister   . Esophageal cancer Neg Hx   . Rectal cancer Neg Hx     Prior to Admission medications   Medication Sig Start Date End Date Taking? Authorizing Provider  acetaminophen (TYLENOL) 325 MG tablet Take 650 mg by mouth every 6 (six) hours as needed for headache, mild pain or fever.    [provider]  albuterol (VENTOLIN HFA) 108 (90 Base) MCG/ACT inhaler Inhale 2 puffs into the lungs every 4 (four) hours as needed for wheezing or shortness of breath. 03/21/20   [name], MD  aspirin 81 MG chewable tablet Chew 1 tablet (81 mg total) by mouth daily. 04/07/20   Thurnell Lose, MD  budesonide-formoterol [facility]) 160-4.5 MCG/ACT inhaler Inhale 2 puffs into the lungs in the morning and at bedtime. 04/12/20   Margaretha Seeds, MD  feeding supplement (ENSURE ENLIVE / ENSURE PLUS) LIQD Take 237 mLs by mouth 2 (two) times daily between meals. 04/01/20   Alma Friendly, MD  Nutritional Supplements (,FEEDING SUPPLEMENT, PROSOURCE PLUS) liquid Take 30 mLs by mouth 2 (two) times daily between meals. 04/01/20   Alma Friendly, MD  omeprazole (PRILOSEC) 40 MG capsule Take 1 capsule (40 mg total) by mouth daily. 05/11/19   [name], MD  ranolazine (RANEXA) 500 MG 12 hr tablet Take 1 tablet (500 mg total) by mouth 2 (two) times daily. 04/29/20   Werner Lean, MD  simvastatin (ZOCOR) 20 MG tablet Take 1 tablet (20 mg total) by mouth at bedtime. 04/29/20   Werner Lean, MD  Tiotropium Bromide Monohydrate (SPIRIVA RESPIMAT) 2.5 MCG/ACT AERS Inhale 2 puffs into the lungs daily. 04/12/20   Margaretha Seeds, MD    Physical Exam: Vitals:   05/09/20 1530 05/09/20 1550 05/09/20 1615 05/09/20 1630  BP: 118/61  119/88 (!) 130/93  Pulse: 96  87 91  Resp: 18  17 19   Temp:  97.6 F (36.4 C)    TempSrc:  Oral    SpO2: 94%  93% 94%  Weight:      Height:         . General:  Appears calm and comfortable and is in NAD, mildly confused with poor ability to answer questions . Eyes:  PERRL, EOMI, normal lids, iris . ENT:  grossly normal hearing, lips & tongue, mmm . Neck:  no LAD, masses or thyromegaly . Cardiovascular:  RRR, no m/r/g. No LE edema.  Marland Kitchen Respiratory:   Bibasilar rhonchi.  Normal respiratory effort. 92-94% on [place] O2. . Abdomen:  soft, NT, ND . Back:  normal alignment, scattered patches to try to prevent pressure ulcers on low back, buttocks . Skin:  no rash or induration seen on limited exam . Musculoskeletal:  grossly normal tone BUE/BLE, good ROM, no bony abnormality . Lower extremity:  No LE edema.  Limited foot exam with no ulcerations.   2+ distal pulses. Marland Kitchen Psychiatric:  grossly normal mood and affect, speech fluent but mildly confused, AOx3 . Neurologic:  CN 2-12 grossly intact, moves all extremities in coordinated fashion    Radiological Exams on Admission: Independently reviewed - see discussion in A/P where applicable  DG Chest Port 1 View  Result Date: 05/09/2020 CLINICAL DATA:  Possible sepsis Fever and cough EXAM: PORTABLE CHEST 1 VIEW COMPARISON:  04/04/2020 FINDINGS: Heart size within normal limits. No pulmonary vascular congestion. Interval improvement in aeration of the lungs with bilateral airspace opacities still remaining. IMPRESSION: Interval improvement in aeration of the lungs with bilateral opacities still remaining consistent with resolving pneumonia. Electronically Signed   By: Miachel Roux M.D.   On: 05/09/2020 12:56    EKG: Independently reviewed.  Sinus tachycardia with rate 140; RBBB, LAFB; nonspecific ST changes with possible acute ischemia   Labs on Admission: I have personally reviewed the available labs and imaging studies at the time of the admission.  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   -SIRS criteria in this patient includes: Fever, tachycardia, tachypnea, hypoxia  -Patient has evidence of acute organ failure with elevated lactate >2; mild encephalopathy; mild evidence of shock liver that is not easily explained by another condition. -While awaiting  blood cultures, this appears to be a preseptic condition. -Sepsis protocol initiated -Suspected source is PNA (see below) -Blood and urine cultures pending -Will admit due to: hypoxemia; will monitor on Progressive care for now -Treat with IV Cefepime/Vanc for undifferentiated sepsis -Will trend lactate to ensure improvement -Will order procalcitonin level.  Antibiotics would not be indicated for PCT <0.1 and probably should not be used for < 0.25.  >0.5 indicates infection and >>0.5 indicates more serious disease.  As the procalcitonin level normalizes, it will be reasonable to consider de-escalation of antibiotic coverage.  Post-COVID PNA -Patient with recent COVID infection, initially + on 1/24 -He reports persistent cough, congestion -Patient presenting with productive cough, fever to 102, mildly decreased oxygen saturation (80s), and infiltrate in both lower lobes on chest x-ray -This appears to be most likely community-acquired pneumonia superimposed on prior COVID PNA.  -CURB-65 score is 2 - will admit the patient to Progressive. -Pneumonia Severity Index (PSI) is Class 4, 9% mortality. -The patient has the following criteria for MDR (multi-drug resistance): IV antibiotics in the last 90 days; >2 days of inpatient treatment within the last 90 days; SNF placement -The patient will need treatment for MRSA and/or Pseudomonas due to MDR risk factors as above; will treat with Cefepime and Vancomycin. -LR @ 75cc/hr -Fever control -Repeat CBC in am  CAD -Continue ASA -He was due for stress testing with  cardiology tomorrow due to concern for residual disease -Will need outpatient cardiology f/u unless acute issues arise while hospitalized  HTN -He does not appear to be taking medications for this issue at this time  HLD -Continue Zocor  COPD -No evidence of exacerbation at this time -Will continue home meds - Albuterol; Pulmicort; Symbicort ([place] formulary substitution); and  Spiriva  Placement issues -He is presenting from SNF -He does not wish to return to SNF -I have discussed this issue with his daughter - she thinks he would push himself more and he would try harder at home -[facility] team consult -PT/OT consults    Note: This patient was positive for the novel coronavirus COVID-19 infection within the last 90 days and does not require repeat testing at this time. He has been fully vaccinated against COVID-19.   Level of care: Progressive DVT prophylaxis:  Lovenox Code Status:  Full - confirmed with patient Family Communication: None present; I have discussed the patient with his daughter by telephone Disposition Plan:  The patient is from: SNF  Anticipated d/c is to: home with [facility] services - he reports that he has arranged for home assistance and wishes to return home following d/c.  TOC consult requested.  Anticipated d/c date will depend on clinical response to treatment, but possibly as early as tomorrow if she has excellent response to treatment  Patient is currently: acutely ill Consults called: PT/OT/TOC team Admission status: Admit - It is my clinical opinion that admission to INPATIENT is reasonable and necessary because of the expectation that this patient will require hospital care that crosses at least 2 midnights to treat this condition based on the medical complexity of the problems presented.  Given the aforementioned information, the predictability of an adverse outcome is felt to be significant.    Karmen Bongo MD [facility]   How to contact the [facility] Attending or Consulting provider [place] or covering provider during after hours [place], for this patient?  1. Check the care team in [facility] and look for a) attending/consulting TRH provider listed and b) the [facility] team listed 2. Log into [email or web address] and use [place]'s universal password to access. If you do not have the password, please contact the hospital operator. 3. Locate the  [facility] provider you are looking for under [facility] and page to a number that you can be directly reached. 4. If you still have difficulty reaching the provider, please page the [facility] (Director on Call) for the Hospitalists listed on amion for assistance.   05/09/2020, 4:49 PM

## 2020-05-09 NOTE — ED Notes (Signed)
Help the patient get cleaned up patient is resting with call bell in reach

## 2020-05-09 NOTE — ED Triage Notes (Signed)
Pt arrived to ED via EMS from [facility]. EMS was called for tachycardia. Per patient he has a cough for  4 weeks and fever. No fever report from ems or c/o cough. Pt is alert and oriented,  tachycardiac, hypotensive on arrival to ED

## 2020-05-09 NOTE — Sepsis Progress Note (Signed)
Code sepsis protocol being monitored by eLink. 

## 2020-05-09 NOTE — ED Notes (Signed)
Pt given water to drink. 

## 2020-05-10 ENCOUNTER — Ambulatory Visit (HOSPITAL_COMMUNITY): Payer: Medicare Other

## 2020-05-10 ENCOUNTER — Inpatient Hospital Stay (HOSPITAL_COMMUNITY): Payer: Medicare Other

## 2020-05-10 DIAGNOSIS — A419 Sepsis, unspecified organism: Secondary | ICD-10-CM | POA: Diagnosis not present

## 2020-05-10 DIAGNOSIS — J189 Pneumonia, unspecified organism: Secondary | ICD-10-CM | POA: Diagnosis not present

## 2020-05-10 LAB — CBC
HCT: 27.5 % — ABNORMAL LOW (ref 39.0–52.0)
Hemoglobin: 8.5 g/dL — ABNORMAL LOW (ref 13.0–17.0)
MCH: 27.9 pg (ref 26.0–34.0)
MCHC: 30.9 g/dL (ref 30.0–36.0)
MCV: 90.2 fL (ref 80.0–100.0)
Platelets: 140 10*3/uL — ABNORMAL LOW (ref 150–400)
RBC: 3.05 MIL/uL — ABNORMAL LOW (ref 4.22–5.81)
RDW: 18.2 % — ABNORMAL HIGH (ref 11.5–15.5)
WBC: 6.1 10*3/uL (ref 4.0–10.5)
nRBC: 0 % (ref 0.0–0.2)

## 2020-05-10 LAB — URINALYSIS, ROUTINE W REFLEX MICROSCOPIC
Bilirubin Urine: NEGATIVE
Glucose, UA: NEGATIVE mg/dL
Hgb urine dipstick: NEGATIVE
Ketones, ur: NEGATIVE mg/dL
Leukocytes,Ua: NEGATIVE
Nitrite: NEGATIVE
Protein, ur: NEGATIVE mg/dL
Specific Gravity, Urine: 1.02 (ref 1.005–1.030)
pH: 5 (ref 5.0–8.0)

## 2020-05-10 LAB — C-REACTIVE PROTEIN: CRP: 11.2 mg/dL — ABNORMAL HIGH (ref <1.0)

## 2020-05-10 LAB — BASIC METABOLIC PANEL
Anion gap: 7 (ref 5–15)
BUN: 17 mg/dL (ref 8–23)
CO2: 21 mmol/L — ABNORMAL LOW (ref 22–32)
Calcium: 9.1 mg/dL (ref 8.9–10.3)
Chloride: 107 mmol/L (ref 98–111)
Creatinine, Ser: 0.98 mg/dL (ref 0.61–1.24)
GFR, Estimated: >60 mL/min (ref >60)
Glucose, Bld: 143 mg/dL — ABNORMAL HIGH (ref 70–99)
Potassium: 3.6 mmol/L (ref 3.5–5.1)
Sodium: 135 mmol/L (ref 135–145)

## 2020-05-10 LAB — D-DIMER, QUANTITATIVE: D-Dimer, Quant: 3.48 ug/mL-FEU — ABNORMAL HIGH (ref 0.00–0.50)

## 2020-05-10 LAB — BRAIN NATRIURETIC PEPTIDE: B Natriuretic Peptide: 107.5 pg/mL — ABNORMAL HIGH (ref 0.0–100.0)

## 2020-05-10 LAB — TSH: TSH: 1.04 u[IU]/mL (ref 0.350–4.500)

## 2020-05-10 LAB — MAGNESIUM: Magnesium: 1.4 mg/dL — ABNORMAL LOW (ref 1.7–2.4)

## 2020-05-10 MED ORDER — HYDROCORTISONE NA SUCCINATE PF 100 MG IJ SOLR
100.0000 mg | Freq: Three times a day (TID) | INTRAMUSCULAR | Status: DC
  Administered 2020-05-10 – 2020-05-11 (×3): 100 mg via INTRAVENOUS
  Filled 2020-05-10 (×3): qty 2

## 2020-05-10 MED ORDER — FUROSEMIDE 10 MG/ML IJ SOLN
60.0000 mg | Freq: Once | INTRAMUSCULAR | Status: AC
  Administered 2020-05-10: 60 mg via INTRAVENOUS
  Filled 2020-05-10: qty 6

## 2020-05-10 MED ORDER — METOPROLOL TARTRATE 5 MG/5ML IV SOLN
5.0000 mg | Freq: Three times a day (TID) | INTRAVENOUS | Status: DC | PRN
  Administered 2020-05-10 – 2020-05-16 (×6): 5 mg via INTRAVENOUS
  Filled 2020-05-10 (×7): qty 5

## 2020-05-10 MED ORDER — METOPROLOL TARTRATE 25 MG PO TABS
25.0000 mg | ORAL_TABLET | Freq: Two times a day (BID) | ORAL | Status: DC
  Administered 2020-05-11 – 2020-05-18 (×13): 25 mg via ORAL
  Filled 2020-05-10 (×13): qty 1

## 2020-05-10 MED ORDER — LEVOFLOXACIN 750 MG PO TABS
750.0000 mg | ORAL_TABLET | Freq: Every day | ORAL | Status: DC
  Administered 2020-05-10: 750 mg via ORAL
  Filled 2020-05-10: qty 1

## 2020-05-10 MED ORDER — SODIUM CHLORIDE 0.9 % IV SOLN: INTRAVENOUS | Status: DC | PRN

## 2020-05-10 MED ORDER — PIPERACILLIN-TAZOBACTAM 3.375 G IVPB
3.3750 g | Freq: Three times a day (TID) | INTRAVENOUS | Status: DC
  Administered 2020-05-10 – 2020-05-16 (×18): 3.375 g via INTRAVENOUS
  Filled 2020-05-10 (×19): qty 50

## 2020-05-10 MED ORDER — POTASSIUM CHLORIDE CRYS ER 20 MEQ PO TBCR
40.0000 meq | EXTENDED_RELEASE_TABLET | Freq: Once | ORAL | Status: AC
  Administered 2020-05-10: 40 meq via ORAL
  Filled 2020-05-10: qty 2

## 2020-05-10 MED ORDER — SODIUM CHLORIDE 0.9 % IV SOLN: 2.0000 g | Freq: Three times a day (TID) | INTRAVENOUS | Status: DC

## 2020-05-10 MED ORDER — MAGNESIUM SULFATE 2 GM/50ML IV SOLN
2.0000 g | Freq: Once | INTRAVENOUS | Status: AC
  Administered 2020-05-10: 2 g via INTRAVENOUS

## 2020-05-10 MED ORDER — TAMSULOSIN HCL 0.4 MG PO CAPS
0.4000 mg | ORAL_CAPSULE | Freq: Every day | ORAL | Status: DC
  Administered 2020-05-10 – 2020-05-18 (×9): 0.4 mg via ORAL
  Filled 2020-05-10 (×9): qty 1

## 2020-05-10 MED ORDER — BOOST PLUS PO LIQD
237.0000 mL | Freq: Three times a day (TID) | ORAL | Status: DC
  Administered 2020-05-10 – 2020-05-18 (×19): 237 mL via ORAL
  Filled 2020-05-10 (×26): qty 237

## 2020-05-10 MED ORDER — VANCOMYCIN HCL 1500 MG/300ML IV SOLN
1500.0000 mg | INTRAVENOUS | Status: DC
  Administered 2020-05-10 – 2020-05-11 (×2): 1500 mg via INTRAVENOUS
  Filled 2020-05-10 (×2): qty 300

## 2020-05-10 MED ORDER — LACTATED RINGERS IV SOLN: INTRAVENOUS | Status: AC

## 2020-05-10 MED ORDER — MIDODRINE HCL 5 MG PO TABS
5.0000 mg | ORAL_TABLET | Freq: Three times a day (TID) | ORAL | Status: DC
  Administered 2020-05-10 – 2020-05-12 (×6): 5 mg via ORAL
  Filled 2020-05-10 (×6): qty 1

## 2020-05-10 MED ORDER — METOPROLOL TARTRATE 25 MG PO TABS
25.0000 mg | ORAL_TABLET | Freq: Two times a day (BID) | ORAL | Status: DC
  Administered 2020-05-10: 25 mg via ORAL
  Filled 2020-05-10: qty 1

## 2020-05-10 MED ORDER — LIP MEDEX EX OINT
1.0000 "application " | TOPICAL_OINTMENT | CUTANEOUS | Status: DC | PRN
  Filled 2020-05-10: qty 7

## 2020-05-10 NOTE — Progress Notes (Signed)
Patient A&O x4, states that he does want to be resuscitated, given meds in an emergency, and intubated. Patient's daughter is in room and agrees with patient's wishes. Hal Hope, MD text paged for notification to change back to FULL code.

## 2020-05-10 NOTE — Progress Notes (Signed)
Pharmacy Antibiotic Note  Tyrone Roman is a 75 y.o. male admitted on 05/09/2020 with sepsis.   Resuming Vancomycin, Zosyn to be started  Plan: Zosyn 3.375 grams iv Q 8 hours Vancomycin  1500mg  q24h  Monitor renal functions, cultures, infectious workup and clinical progression   Height: 6\' 2"  (188 cm) Weight: 88 kg (194 lb 0.1 oz) IBW/kg (Calculated) : 82.2  Temp (24hrs), Avg:98.8 F (37.1 C), Min:97.4 F (36.3 C), Max:102.2 F (39 C)  Recent Labs  Lab 05/09/20 1212 05/09/20 1621 05/10/20 0106  WBC 8.3  --  6.1  CREATININE 1.48*  --  0.98  LATICACIDVEN 3.3* 1.6  --     Estimated Creatinine Clearance: 76.9 mL/min (by C-G formula based on SCr of 0.98 mg/dL).    Allergies  Allergen Reactions  . Cephalexin Shortness Of Breath, Rash and Other (See Comments)    Tolerated Augmentin 2015 and 2017 (12-2017). Tolerated nafcillin 12/2017 PATIENT HAS HAD A PCN REACTION WITH IMMEDIATE RASH, FACIAL/TONGUE/THROAT SWELLING, SOB, OR LIGHTHEADEDNESS WITH HYPOTENSION:  #  #  YES  #  #  Has patient had a PCN reaction causing severe rash involving mucus membranes or skin necrosis: No Has patient had a PCN reaction that required hospitalization: No Has patient had a PCN reaction occurring within the last 10 years: No If all of the above answers are "NO", then may proceed  . Clarithromycin Shortness Of Breath and Rash  . Certolizumab Pegol Hives  . Tocilizumab Hives  . Doxycycline Rash  . Hydroxyzine Rash  . Lidoderm Other (See Comments)    "made me act weird"  . Pyrithione Zinc Rash    Thank you for allowing pharmacy to be a part of this patient's care. Anette Guarneri, PharmD Clinical Pharmacist  05/10/2020 4:15 PM

## 2020-05-10 NOTE — Progress Notes (Addendum)
PROGRESS NOTE                                                                                                                                                                                                             Patient Demographics:    Tyrone Roman, is a 75 y.o. male, DOB - 1945-04-21, RXV:[id number]  Outpatient Primary MD for the patient is [name], MD    LOS - 1  Admit date - 05/09/2020    Chief Complaint  Patient presents with  . Tachycardia       Brief Narrative (HPI from H&P)  Tyrone Roman is a 75 y.o. male with medical history significant of RA; CAD s/p stent; HTN; HLD; COPD; and BPH presenting with tachycardia.  He can't remember what happened, work-up here suggested hypoxia CHF/pneumonia of care.   Subjective:    [name] today has, No headache, No chest pain, No abdominal pain - No Nausea, No new weakness tingling or numbness, improved SOB.   Assessment  & Plan :     1. Acute Hypoxic Resp. Failure due to Acute on Chronic dCHF EF 60% - he has crackles on exam currently seems to be acute on chronic diastolic CHF low protein levels causing intravascular dehydration and third spacing.  Do not think he has acute bacterial infection as he is afebrile, no leukocytosis with normal procalcitonin levels, stop antibiotics IV and place on 5 dose of oral Levaquin ( stop earlier if stays stable) , IV Lasix, stop IVF, Boost TID, supportive Rx and monitor.  Discussed with his daughter, he has had multiple hospital admissions lately, and overall health condition seems to be gradually declining, involve palliative care for deciding goals of care. Now DNR, if declines comfort, DW daughter x 2.  Addendum - 4pm patient is now febrile hypotensive and tachycardic, he has exposure to immunosuppressants due to his rheumatoid arthritis, blood cultures are pending, although no leukocytosis or procalcitonin elevation but  for now we will go ahead and give him IV fluid bolus, broaden his antibiotic to vancomycin-Zosyn, continue Levaquin for atypical coverage.  Note he has multiple drug allergies.   SpO2: 98 % O2 Flow Rate (L/min): 2 L/min FiO2 (%): 28 %  Recent Labs  Lab 05/09/20 1212 05/09/20 1621 05/10/20 0106 05/10/20 0807  WBC 8.3  --  6.1  --  HGB 11.3*  --  8.5*  --   HCT 35.9*  --  27.5*  --   PLT 187  --  140*  --   CRP  --   --   --  11.2*  BNP  --   --   --  107.5*  DDIMER  --   --   --  3.48*  PROCALCITON <0.10  --   --   --   AST 44*  --   --   --   ALT 22  --   --   --   ALKPHOS 101  --   --   --   BILITOT 1.5*  --   --   --   ALBUMIN 2.4*  --   --   --   INR 1.2  --   --   --   LATICACIDVEN 3.3* 1.6  --   --   SARSCOV2NAA POSITIVE*  --   --   --       2. Deconditioning, possible early dementia. Patient has had two recent admissions to the hospital . Supportive care with PT OT. At risk for delirium, minimize narcotics and benzodiazepines.   3. Metabolic encephalopathy. Likely also has undiagnosed early dementia, as in #2 above.  4. Acute on Chronic diastolic CHF recent EF 55 to 60%. Continue low-dose beta-blocker and rest as in #1 above.  5. BPH. On Flomax.  6. Dyslipidemia. On statin continue.  7. History of Rheumatoid arthritis. Takes Rituxan every 4 months.  8. HTN - blood pressure slightly on the lower side with tachycardia, on low-dose beta-blocker if blood pressure can tolerate, reviewed old chart, few months ago he had an episode of adrenal insufficiency, will challenge him with stress dose steroids as well.  9.  Elevated D-dimer.  Chest pain, could be due to inflammation, check leg ultrasound and continue prophylactic dose Lovenox for now.       Condition - Extremely Guarded  Family Communication  :  Daughter [name] [phone] on 05/10/20  Code Status :  DNR  Consults  :  Pall.Care  PUD Prophylaxis : PPI   Procedures  :     Leg ultrasound.       Disposition Plan  :    Status is: Inpatient  Remains inpatient appropriate because:IV treatments appropriate due to intensity of illness or inability to take PO   Dispo: The patient is from: SNF              Anticipated d/c is to: SNF              Patient currently is not medically stable to d/c.   Difficult to place patient No   DVT Prophylaxis  :  Lovenox   Lab Results  Component Value Date   PLT 140 (L) 05/10/2020    Diet :  Diet Order            Diet regular Room service appropriate? Yes; Fluid consistency: Thin  Diet effective now                  Inpatient Medications  Scheduled Meds: . aspirin  81 mg Oral Daily  . budesonide  0.5 mg Nebulization BID  . Chlorhexidine Gluconate Cloth  6 each Topical Q0600  . enoxaparin (LOVENOX) injection  40 mg Subcutaneous Q24H  . lactose free nutrition  237 mL Oral TID WC  . levofloxacin  750 mg Oral Daily  . melatonin  5 mg  Oral QHS  . metoprolol tartrate  25 mg Oral BID  . mometasone-formoterol  2 puff Inhalation BID  . mupirocin ointment  1 application Nasal BID  . pantoprazole  40 mg Oral Daily  . simvastatin  20 mg Oral Daily  . sodium chloride flush  3 mL Intravenous Q12H  . tamsulosin  0.4 mg Oral Daily  . umeclidinium bromide  1 puff Inhalation Daily   Continuous Infusions: . magnesium sulfate bolus IVPB     PRN Meds:.acetaminophen **OR** [DISCONTINUED] acetaminophen, albuterol, [DISCONTINUED] ondansetron **OR** ondansetron (ZOFRAN) IV  Antibiotics  :    Anti-infectives (From admission, onward)   Start     Dose/Rate Route Frequency Ordered Stop   05/10/20 1400  ceFEPIme (MAXIPIME) 2 g in sodium chloride 0.9 % 100 mL IVPB  Status:  Discontinued        2 g 200 mL/hr over 30 Minutes Intravenous Every 8 hours 05/10/20 1002 05/10/20 1108   05/10/20 1330  vancomycin (VANCOREADY) IVPB 1500 mg/300 mL  Status:  Discontinued        1,500 mg 150 mL/hr over 120 Minutes Intravenous Every 24 hours 05/09/20 1355  05/10/20 1108   05/10/20 1215  levofloxacin (LEVAQUIN) tablet 750 mg        750 mg Oral Daily 05/10/20 1115 05/15/20 0959   05/10/20 0100  ceFEPIme (MAXIPIME) 2 g in sodium chloride 0.9 % 100 mL IVPB  Status:  Discontinued        2 g 200 mL/hr over 30 Minutes Intravenous Every 12 hours 05/09/20 1355 05/10/20 1002   05/09/20 1230  ceFEPIme (MAXIPIME) 2 g in sodium chloride 0.9 % 100 mL IVPB        2 g 200 mL/hr over 30 Minutes Intravenous  Once 05/09/20 1227 05/09/20 1324   05/09/20 1230  vancomycin (VANCOREADY) IVPB 2000 mg/400 mL        2,000 mg 200 mL/hr over 120 Minutes Intravenous  Once 05/09/20 1228 05/09/20 1546       Time Spent in minutes  30   Lala Lund M.D on 05/10/2020 at 11:36 AM  To page go to [email or web address]   [facility] -  Office  [phone]    See all Orders from today for further details    Objective:   Vitals:   05/09/20 2325 05/10/20 0333 05/10/20 0746 05/10/20 0850  BP: 108/84 127/75  112/80  Pulse: 86 84 (!) 115 94  Resp: 19 19 20 20   Temp: (!) 97.4 F (36.3 C) 97.6 F (36.4 C)  99.8 F (37.7 C)  TempSrc: Axillary Axillary  Oral  SpO2:  97%  98%  Weight:      Height:        Wt Readings from Last 3 Encounters:  05/09/20 88 kg  05/05/20 88.5 kg  04/26/20 88.5 kg     Intake/Output Summary (Last 24 hours) at 05/10/2020 1136 Last data filed at 05/10/2020 0854 Gross per 24 hour  Intake --  Output 1300 ml  Net -1300 ml     Physical Exam  Awake, mildly confused, No new F.N deficits,   [place].AT,PERRAL Supple Neck,No JVD, No cervical lymphadenopathy appriciated.  Symmetrical Chest wall movement, Good air movement bilaterally, +ve rales RRR,No Gallops,Rubs or new Murmurs, No Parasternal Heave +ve B.Sounds, Abd Soft, No tenderness, No organomegaly appriciated, No rebound - guarding or rigidity. No Cyanosis, Clubbing or edema, No new Rash or bruise     Data Review:    CBC Recent Labs  Lab 05/09/20 1212  05/10/20 0106  WBC 8.3  6.1  HGB 11.3* 8.5*  HCT 35.9* 27.5*  PLT 187 140*  MCV 89.5 90.2  MCH 28.2 27.9  MCHC 31.5 30.9  RDW 18.2* 18.2*  LYMPHSABS 0.4*  --   MONOABS 0.5  --   EOSABS 0.2  --   BASOSABS 0.0  --     Recent Labs  Lab 05/09/20 1212 05/09/20 1621 05/10/20 0106 05/10/20 0807  NA 136  --  135  --   K 4.4  --  3.6  --   CL 101  --  107  --   CO2 22  --  21*  --   GLUCOSE 128*  --  143*  --   BUN 19  --  17  --   CREATININE 1.48*  --  0.98  --   CALCIUM 9.8  --  9.1  --   AST 44*  --   --   --   ALT 22  --   --   --   ALKPHOS 101  --   --   --   BILITOT 1.5*  --   --   --   ALBUMIN 2.4*  --   --   --   MG  --   --   --  1.4*  CRP  --   --   --  11.2*  DDIMER  --   --   --  3.48*  PROCALCITON <0.10  --   --   --   LATICACIDVEN 3.3* 1.6  --   --   INR 1.2  --   --   --   TSH  --   --   --  1.040  BNP  --   --   --  107.5*    ------------------------------------------------------------------------------------------------------------------ No results for input(s): CHOL, HDL, LDLCALC, TRIG, CHOLHDL, LDLDIRECT in the last 72 hours.  Lab Results  Component Value Date   HGBA1C 7.7 (H) 04/01/2020   ------------------------------------------------------------------------------------------------------------------ Recent Labs    05/10/20 0807  TSH 1.040    Cardiac Enzymes No results for input(s): CKMB, TROPONINI, MYOGLOBIN in the last 168 hours.  Invalid input(s): CK ------------------------------------------------------------------------------------------------------------------    Component Value Date/Time   BNP 107.5 (H) 05/10/2020 1829    Micro Results Recent Results (from the past 240 hour(s))  Resp Panel by RT-PCR (Flu A&B, Covid) Nasopharyngeal Swab     Status: Abnormal   Collection Time: 05/09/20 12:12 PM   Specimen: Nasopharyngeal Swab; Nasopharyngeal(NP) swabs in vial transport medium  Result Value Ref Range Status   SARS Coronavirus 2 by RT PCR POSITIVE (A)  NEGATIVE Final    Comment: RESULT CALLED TO, READ BACK BY AND VERIFIED WITH: RN P [facility] [id number] AT 1327 BY CM (NOTE) SARS-CoV-2 target nucleic acids are DETECTED.  The SARS-CoV-2 RNA is generally detectable in upper respiratory specimens during the acute phase of infection. Positive results are indicative of the presence of the identified virus, but do not rule out bacterial infection or co-infection with other pathogens not detected by the test. Clinical correlation with patient history and other diagnostic information is necessary to determine patient infection status. The expected result is Negative.  Fact Sheet for Patients: EntrepreneurPulse.com.au  Fact Sheet for Healthcare Providers: IncredibleEmployment.be  This test is not yet approved or cleared by the [place] FDA and  has been authorized for detection and/or diagnosis of SARS-CoV-2 by FDA under an Emergency Use Authorization (EUA).  This EUA will remain in effect (  meaning this test can be u sed) for the duration of  the COVID-19 declaration under Section 564(b)(1) of the Act, 21 U.S.C. section 360bbb-3(b)(1), unless the authorization is terminated or revoked sooner.     Influenza A by PCR NEGATIVE NEGATIVE Final   Influenza B by PCR NEGATIVE NEGATIVE Final    Comment: (NOTE) The Xpert Xpress SARS-CoV-2/FLU/RSV plus assay is intended as an aid in the diagnosis of influenza from Nasopharyngeal swab specimens and should not be used as a sole basis for treatment. Nasal washings and aspirates are unacceptable for Xpert Xpress SARS-CoV-2/FLU/RSV testing.  Fact Sheet for Patients: EntrepreneurPulse.com.au  Fact Sheet for Healthcare Providers: IncredibleEmployment.be  This test is not yet approved or cleared by the [place] FDA and has been authorized for detection and/or diagnosis of SARS-CoV-2 by FDA under an Emergency Use Authorization  (EUA). This EUA will remain in effect (meaning this test can be used) for the duration of the COVID-19 declaration under Section 564(b)(1) of the Act, 21 U.S.C. section 360bbb-3(b)(1), unless the authorization is terminated or revoked.  Performed at [facility], [place] [address], [place], [place] [address]   Blood Culture (routine x 2)     Status: None (Preliminary result)   Collection Time: 05/09/20 12:12 PM   Specimen: BLOOD  Result Value Ref Range Status   Specimen Description BLOOD SITE NOT SPECIFIED  Final   Special Requests   Final    BOTTLES DRAWN AEROBIC AND ANAEROBIC Blood Culture adequate volume   Culture   Final    NO GROWTH < 24 HOURS Performed at [facility], [place] [address] [place], [place] [address]    Report Status PENDING  Incomplete  Blood Culture (routine x 2)     Status: None (Preliminary result)   Collection Time: 05/09/20 12:12 PM   Specimen: BLOOD  Result Value Ref Range Status   Specimen Description BLOOD BLOOD LEFT HAND  Final   Special Requests   Final    BOTTLES DRAWN AEROBIC AND ANAEROBIC Blood Culture adequate volume   Culture   Final    NO GROWTH < 24 HOURS Performed at [facility], [place] [address] [place], [place] [address]    Report Status PENDING  Incomplete  MRSA PCR Screening     Status: Abnormal   Collection Time: 05/09/20 12:30 PM   Specimen: Nasal Mucosa; Nasopharyngeal  Result Value Ref Range Status   MRSA by PCR POSITIVE (A) NEGATIVE Final    Comment:        The GeneXpert MRSA Assay (FDA approved for NASAL specimens only), is one component of a comprehensive MRSA colonization surveillance program. It is not intended to diagnose MRSA infection nor to guide or monitor treatment for MRSA infections. RESULT CALLED TO, READ BACK BY AND VERIFIED WITH: IDOL,K RN AT 2212 05/09/2020 MITCHELL,L Performed at [facility], [place] [address] [place], [place] [address]     Radiology Reports DG Chest [place] 1  View  Result Date: 05/10/2020 CLINICAL DATA:  Short of breath, history of COPD EXAM: PORTABLE CHEST 1 VIEW COMPARISON:  Prior chest x-ray dated yesterday FINDINGS: Stable cardiac and mediastinal contours. Marked interval increase in diffuse interstitial prominence bilaterally now with [place] B-lines in the periphery. Findings are most consistent with worsening CHF. No large effusion or pneumothorax. No acute osseous abnormality. IMPRESSION: Increasing interstitial pulmonary edema concerning for progressive CHF. Electronically Signed   By: Jacqulynn Cadet M.D.   On: 05/10/2020 07:54  DG Chest Port 1 View  Result Date: 05/09/2020 CLINICAL DATA:  Possible sepsis Fever and cough EXAM: PORTABLE CHEST 1 VIEW COMPARISON:  04/04/2020 FINDINGS: Heart size within normal limits. No pulmonary vascular congestion. Interval improvement in aeration of the lungs with bilateral airspace opacities still remaining. IMPRESSION: Interval improvement in aeration of the lungs with bilateral opacities still remaining consistent with resolving pneumonia. Electronically Signed   By: Miachel Roux M.D.   On: 05/09/2020 12:56

## 2020-05-10 NOTE — Evaluation (Signed)
Physical Therapy Evaluation Patient Details Name: Tyrone Roman MRN: [id number] DOB: 1945-06-30 Today's Date: 05/10/2020   History of Present Illness  75 y.o. male presenting with tachycardia and confusion. Presents from [facility] after hospitalization in February for COVID PNA.  In ED found to be febrile, BP 90s, tachycardic and tachypnic. B lung opacities on CXR. Admitted 05/09/20 for treatment of sepsis and likely PNA. PMH: RA; CAD s/p stent; HTN; HLD; COPD; and BPH  Clinical Impression  Pt not oriented to time or situation. Pt is a poor historian, does not provide the same information to PT and OT regarding PLOF and home set up. Pt reports that at [facility] he was walking around without a RW and able to do his own bathing and dressing. Given pt current level of function unlikely accurate recording of PLOF. Pt is limited in safe mobility by decreased awareness of safety and deficits, slowed processing and difficulty sequencing in presence of generalized weakness and decreased endurance. Pt requires modA for bed mobility, transfers and ambulation of 2 feet with RW. Pt is adamant about not returning to [facility] however PT recommends SNF level rehab at discharge. PT will continue to follow acutely.     Follow Up Recommendations SNF    Equipment Recommendations  Other (comment) (has necessary equipment)       Precautions / Restrictions Precautions Precautions: Fall;Other (comment) Precaution Comments: monitor HR, O2 Restrictions Weight Bearing Restrictions: No      Mobility  Bed Mobility Overal bed mobility: Needs Assistance Bed Mobility: Supine to Sit     Supine to sit: Mod assist     General bed mobility comments: very delayed response to commands for moving LE to EOB, requires increased multimodal cuing for problem solving use of bed rail to pull himself to the EoB, requires mod A for bringing trunk to upright    Transfers Overall transfer level: Needs  assistance Equipment used: Rolling walker (2 wheeled) Transfers: Sit to/from Stand Sit to Stand: Mod assist         General transfer comment: modA for coming to standing in RW, increased cuing for hand placement and sequencing  Ambulation/Gait Ambulation/Gait assistance: Mod assist Gait Distance (Feet): 2 Feet Assistive device: Rolling walker (2 wheeled) Gait Pattern/deviations: Step-to pattern;Decreased step length - right;Decreased step length - left;Trunk flexed;Shuffle;Narrow base of support Gait velocity: slowed Gait velocity interpretation: <1.31 ft/sec, indicative of household ambulator General Gait Details: pt appears to have no knowledge of proper RW use, despite maximal multimodal cuing for proximity in RW, pt with bilateral knee buckling requiring mod A for safely getting to recliner        Balance Overall balance assessment: Needs assistance Sitting-balance support: No upper extremity supported;Feet supported Sitting balance-Leahy Scale: Fair     Standing balance support: Bilateral upper extremity supported Standing balance-Leahy Scale: Poor                               Pertinent Vitals/Pain Pain Assessment: No/denies pain    Home Living Family/patient expects to be discharged to:: Private residence Living Arrangements: Alone Available Help at Discharge: Family;Available PRN/intermittently Type of Home: House Home Access: Stairs to enter   Entrance Stairs-Number of Steps: 2 Home Layout: Two level;Able to live on main level with bedroom/bathroom;1/2 bath on main level Home Equipment: Walker - 2 wheels;Bedside commode;Toilet riser;Cane - single point      Prior Function Level of Independence: Needs assistance  Gait / Transfers Assistance Needed: ambulated with HHA at [place] / Homemaking Assistance Needed: independent with bathing and dressing, family will assist with iADLs  Comments: Unsure of true PLOF accuracy or how pt has been  doing at rehab due to poor historian     Hand Dominance   Dominant Hand: Right    Extremity/Trunk Assessment   Upper Extremity Assessment Upper Extremity Assessment: Defer to OT evaluation    Lower Extremity Assessment Lower Extremity Assessment: Generalized weakness       Communication   Communication: No difficulties  Cognition Arousal/Alertness: Awake/alert Behavior During Therapy: Flat affect Overall Cognitive Status: Impaired/Different from baseline Area of Impairment: Orientation;Attention;Memory;Safety/judgement;Awareness;Problem solving                 Orientation Level: Disoriented to;Situation;Time Current Attention Level: Selective Memory: Decreased short-term memory   Safety/Judgement: Decreased awareness of safety;Decreased awareness of deficits Awareness: Emergent Problem Solving: Slow processing;Decreased initiation;Difficulty sequencing;Requires verbal cues General Comments: pt not consistent with his reports to PT/OT of PLOF, very fatigued throughout with increased eyes shut, and requiring increased encouragement for movement      General Comments General comments (skin integrity, edema, etc.): Pt on 3L O2 via [place] on entry SaO2 92%O2 via [place], HR 90-100s max noted 118bpm with mobility        Assessment/Plan    PT Assessment Patient needs continued PT services  PT Problem List Decreased strength;Decreased activity tolerance;Decreased balance;Decreased mobility;Decreased coordination;Decreased cognition;Decreased safety awareness;Decreased knowledge of use of DME;Cardiopulmonary status limiting activity       PT Treatment Interventions DME instruction;Gait training;Functional mobility training;Therapeutic activities;Therapeutic exercise;Balance training;Cognitive remediation;Patient/family education    PT Goals (Current goals can be found in the Care Plan section)  Acute Rehab PT Goals Patient Stated Goal: not to go back to [facility] PT Goal  Formulation: Patient unable to participate in goal setting Time For Goal Achievement: 05/24/20 Potential to Achieve Goals: Fair    Frequency Min 2X/week    AM-PAC PT "6 Clicks" Mobility  Outcome Measure Help needed turning from your back to your side while in a flat bed without using bedrails?: A Little Help needed moving from lying on your back to sitting on the side of a flat bed without using bedrails?: A Lot Help needed moving to and from a bed to a chair (including a wheelchair)?: A Lot Help needed standing up from a chair using your arms (e.g., wheelchair or bedside chair)?: A Lot Help needed to walk in hospital room?: Total Help needed climbing 3-5 steps with a railing? : Total 6 Click Score: 11    End of Session Equipment Utilized During Treatment: Gait belt;Oxygen Activity Tolerance: Patient limited by fatigue Patient left: in chair;with call bell/phone within reach;with chair alarm set Nurse Communication: Mobility status;Other (comment) (pure flow not working bed saturated in urine) PT Visit Diagnosis: Unsteadiness on feet (R26.81);Other abnormalities of gait and mobility (R26.89);Muscle weakness (generalized) (M62.81);Difficulty in walking, not elsewhere classified (R26.2)    Time: 1287-8676 PT Time Calculation (min) (ACUTE ONLY): 44 min   Charges:   PT Evaluation $PT Eval Moderate Complexity: 1 Mod PT Treatments $Therapeutic Activity: 23-37 mins        Dina Mobley B. Migdalia Dk PT, DPT Acute Rehabilitation Services Pager [phone] Office [phone]   [place] 05/10/2020, 12:43 PM

## 2020-05-10 NOTE — Evaluation (Signed)
Clinical/Bedside Swallow Evaluation Patient Details  Name: Tyrone Roman MRN: [id number] Date of Birth: December 15, 1945  Today's Date: 05/10/2020 Time: SLP Start Time (ACUTE ONLY): 64 SLP Stop Time (ACUTE ONLY): 1135 SLP Time Calculation (min) (ACUTE ONLY): 30 min  Past Medical History:  Past Medical History:  Diagnosis Date  . Allergy   . Barrett's esophageal ulceration   . BPH (benign prostatic hyperplasia)   . CAD (coronary artery disease)    sees Dr. Mar Daring  . Cataract    both eyes removed   . Colonic polyp   . Concussion with loss of consciousness of 30 minutes or less   . Contact dermatitis   . Contact with or exposure to venereal diseases   . COPD (chronic obstructive pulmonary disease) ([place])    sees Dr. Kara Mead   . Diverticulosis of colon   . Dysphagia   . GERD (gastroesophageal reflux disease)    past hx- on meds   . HNP (herniated nucleus pulposus), lumbar    recurrent  . Hyperlipidemia   . Hypertension   . Myocardial infarction [facility])    2011- stent placed  . Nephrolithiasis   . Rheumatoid arthritis [facility])    sees Dr. Gavin Pound   . SOB (shortness of breath)    Past Surgical History:  Past Surgical History:  Procedure Laterality Date  . BACK SURGERY    . CARDIAC CATHETERIZATION N/A 01/19/2016   Procedure: Left Heart Cath and Coronary Angiography;  Surgeon: Burnell Blanks, MD;  Location: [place] [facility];  Service: Cardiovascular;  Laterality: N/A;  . COLONOSCOPY  12/09/2014   per Dr. Silverio Decamp, adenomatous polyps, repeat 3 years   . CORONARY ANGIOPLASTY WITH STENT PLACEMENT    . ESOPHAGOGASTRODUODENOSCOPY (EGD) WITH ESOPHAGEAL DILATION  02/17/09   Barretts esophagus   . EYE SURGERY    . KNEE ARTHROSCOPY Left X 2  . LUMBAR LAMINECTOMY/DECOMPRESSION MICRODISCECTOMY Right 10/07/2017   Procedure: RIGHT LUMBAR ONE-TWO LAMINECTOMY WITH MICRODISCECTOMY;  Surgeon: Consuella Lose, MD;  Location: [place];  Service: Neurosurgery;  Laterality: Right;  .  LUMBAR LAMINECTOMY/DECOMPRESSION MICRODISCECTOMY N/A 12/06/2017   Procedure: RECURRENT MICRODISCECTOMY LUMBAR ONE - LUMBAR TWO;  Surgeon: Consuella Lose, MD;  Location: [place];  Service: Neurosurgery;  Laterality: N/A;  . LUMBAR WOUND DEBRIDEMENT N/A 12/11/2017   Procedure: LUMBAR WOUND DEBRIDEMENT;  Surgeon: Consuella Lose, MD;  Location: [place];  Service: Neurosurgery;  Laterality: N/A;  . POLYPECTOMY    . TONSILLECTOMY    . UPPER GASTROINTESTINAL ENDOSCOPY     HPI:  75yo male admitted 05/09/20 with tachycardia. PMH: RA, CAD s/p stent, HTN, HLD, COPD, BPH. Barrett's esophagus, Dysphagia, Has had hospitalizations multiple months in a row. CXR = findings most consistent with worsening CHF.   Assessment / Plan / Recommendation Clinical Impression  Pt was seen at bedside for assessment of swallow function and safety. Pt has multiple high risk indicators, including a history of Barrett's esophagus and GERD. Pt also has a history of COPD, which increases the likelihood of discoordination between swallowing and respiration. He denies difficulty swallowing, and does not exhibit overt s/s aspiration on thin liquid, puree, solids, or medications whole with liquid (RN present administering medications), even when challenged. Unfortunately, silent aspiration cannot be detected with clinical assessment. Pt has been admitted to the hospital multiple times since January 2022 with PNA. Primary esophageal dysphagia is suspected, and instrumental assessment to examine esophageal motility and determine if backflow is being aspirated is recommended. RN and MD informed of  recommendation. SLP will follow up after results are available.   SLP Visit Diagnosis: Dysphagia, unspecified (R13.10)    Aspiration Risk  Moderate aspiration risk    Diet Recommendation Regular;Thin liquid   Liquid Administration via: Cup;Straw Medication Administration: Whole meds with liquid Supervision: Patient able to self  feed Compensations: Slow rate;Small sips/bites Postural Changes: Seated upright at 90 degrees;Remain upright for at least 30 minutes after po intake    Other  Recommendations Recommended Consults: Consider esophageal assessment Oral Care Recommendations: Oral care BID   Follow up Recommendations Other (comment) (TBD)      Frequency and Duration min 1 x/week  1 week;2 weeks       Prognosis Prognosis for Safe Diet Advancement: Good      Swallow Study   General Date of Onset: 05/09/20 HPI: 75yo male admitted 05/09/20 with tachycardia. PMH: RA, CAD s/p stent, HTN, HLD, COPD, BPH. Barrett's esophagus, Dysphagia, Has had hospitalizations multiple months in a row. CXR = findings most consistent with worsening CHF. Type of Study: Bedside Swallow Evaluation Previous Swallow Assessment: BSE 04/03/20 - reg/thin, no f/u Diet Prior to this Study: Regular;Thin liquids Temperature Spikes Noted:  (99.8) Respiratory Status: Nasal cannula History of Recent Intubation: No Behavior/Cognition: Alert;Cooperative Oral Cavity Assessment: Within Functional Limits Oral Care Completed by SLP: No Oral Cavity - Dentition: Adequate natural dentition Vision: Functional for self-feeding Self-Feeding Abilities: Able to feed self Patient Positioning: Upright in chair Baseline Vocal Quality: Normal Volitional Cough: Strong Volitional Swallow: Able to elicit    Oral/Motor/Sensory Function Overall Oral Motor/Sensory Function: Within functional limits   Ice Chips Ice chips: Not tested   Thin Liquid Thin Liquid: Within functional limits Presentation: Straw    Nectar Thick Nectar Thick Liquid: Not tested   Honey Thick Honey Thick Liquid: Not tested   Puree Puree: Within functional limits Presentation: Spoon   Solid     Solid: Within functional limits Presentation: Nilwood B. Quentin Ore, [facility] [place] Speech Language Pathologist Office: [phone]  Shonna Chock 05/10/2020,12:07  PM

## 2020-05-10 NOTE — Progress Notes (Signed)
Alerted Dr. Candiss Norse of pt's change in VS. Following orders as directed. PO tylenol given, LR bolus started, frequent VS being taken, ice packs applied under arms. Will cont to monitor pt.

## 2020-05-10 NOTE — Evaluation (Signed)
Occupational Therapy Evaluation Patient Details Name: Tyrone Roman MRN: [id number] DOB: [birth year] Today's Date: 05/10/2020    History of Present Illness 75 y.o. male presenting with tachycardia and confusion. Presents from [facility] after hospitalization in February for COVID PNA.  In ED found to be febrile, BP 90s, tachycardic and tachypnic. B lung opacities on CXR. Admitted 05/09/20 for treatment of sepsis and likely PNA. PMH: RA; CAD s/p stent; HTN; HLD; COPD; and BPH   Clinical Impression   Pt admitted from short term SNF rehab. Prior to rehab, pt reports living at home alone, independent in ADLs/IADLs with intermittent use of cane or walker outside of the home. Unsure of PLOF accuracy as pt with flat affect and lethargy during session. Pt received in recliner after PT session with limited participation with OT citing fatigue and LE weakness. Educated on energy conservation strategies and modification of ADLs as needed to encourage participation though pt declined even lifting B UE for ROM/strength assessment. Pt required Max A to brush hair today, Max A for all other UB ADLs and Total A for LB ADLs based on presentation today. Plan to progress ADLs seated/standing at sink, reinforce energy conservation strategies and facilitate BSC transfer training in future sessions. Recommend DC back to SNF rehab.     Follow Up Recommendations  SNF;Supervision/Assistance - 24 hour    Equipment Recommendations  Other (comment) (to be determined based on progress)    Recommendations for Other Services       Precautions / Restrictions Precautions Precautions: Fall;Other (comment) Precaution Comments: monitor HR, O2 Restrictions Weight Bearing Restrictions: No      Mobility Bed Mobility               General bed mobility comments: received in recliner    Transfers                 General transfer comment: pt declined citing fatigue    Balance Overall balance assessment: Needs  assistance Sitting-balance support: No upper extremity supported;Feet supported Sitting balance-Leahy Scale: Fair                                     ADL either performed or assessed with clinical judgement   ADL Overall ADL's : Needs assistance/impaired Eating/Feeding: Set up;Sitting   Grooming: Sitting;Brushing hair;Maximal assistance Grooming Details (indicate cue type and reason): Max A to brush hair as pt reported desire to brush hair but then reported too fatigued to lift UEs to head. Upper Body Bathing: Maximal assistance;Sitting   Lower Body Bathing: Total assistance;Sit to/from stand   Upper Body Dressing : Maximal assistance;Sitting   Lower Body Dressing: Total assistance;Sit to/from stand       Toileting- Water quality scientist and Hygiene: Total assistance;Sit to/from stand         General ADL Comments: Limited by pt's decreased participation citing fatigue, keeping eyes closed for most of session.     Vision Patient Visual Report: No change from baseline Vision Assessment?: No apparent visual deficits     Perception     Praxis      Pertinent Vitals/Pain Pain Assessment: No/denies pain     Hand Dominance Right   Extremity/Trunk Assessment Upper Extremity Assessment Upper Extremity Assessment: Generalized weakness   Lower Extremity Assessment Lower Extremity Assessment: Defer to PT evaluation       Communication Communication Communication: No difficulties   Cognition Arousal/Alertness: Awake/alert Behavior During  Therapy: Flat affect Overall Cognitive Status: Impaired/Different from baseline Area of Impairment: Orientation;Attention;Memory;Safety/judgement;Awareness;Problem solving                 Orientation Level: Disoriented to;Situation Current Attention Level: Selective Memory: Decreased short-term memory   Safety/Judgement: Decreased awareness of safety;Decreased awareness of deficits Awareness:  Emergent Problem Solving: Slow processing;Decreased initiation;Difficulty sequencing;Requires verbal cues General Comments: discrepancies noted with PLOF report to OT vs PT earlier. Pt with flat affect, endorses fatigue but reports too fatigued to complete simple tasks despite OT assist to modify. Decreased insight into deficits   General Comments  received on 3 L O2, SpO2 98% so titrated down to 2 L O2 at 95%. HR low 100s at rest.    Exercises     Shoulder Instructions      Home Living Family/patient expects to be discharged to:: Private residence Living Arrangements: Alone Available Help at Discharge: Family;Available PRN/intermittently Type of Home: House Home Access: Stairs to enter Entrance Stairs-Number of Steps: 2   Home Layout: Two level;Able to live on main level with bedroom/bathroom;1/2 bath on main level Alternate Level Stairs-Number of Steps: flight Alternate Level Stairs-Rails: Right;Left;Can reach both Bathroom Shower/Tub: Occupational psychologist: Handicapped height Bathroom Accessibility: Yes   Home Equipment: Environmental consultant - 2 wheels;Bedside commode;Toilet riser;Cane - single point          Prior Functioning/Environment Level of Independence: Needs assistance  Gait / Transfers Assistance Needed: pt reports no use of AD in the home, use of cane or walker outside of the home. Then pt reports at [facility], was not using AD though had difficulty walking ADL's / Homemaking Assistance Needed: Pt reports previously Independent with ADLs, could complete IADLs, drive and grocery shop.   Comments: Unsure of true PLOF accuracy or how pt has been doing at rehab due to poor historian        OT Problem List: Decreased strength;Decreased activity tolerance;Impaired balance (sitting and/or standing);Decreased cognition;Decreased safety awareness;Decreased knowledge of use of DME or AE;Cardiopulmonary status limiting activity      OT Treatment/Interventions: Self-care/ADL  training;Therapeutic exercise;Energy conservation;DME and/or AE instruction;Therapeutic activities;Patient/family education;Balance training    OT Goals(Current goals can be found in the care plan section) Acute Rehab OT Goals Patient Stated Goal: rest OT Goal Formulation: With patient Time For Goal Achievement: 05/24/20 Potential to Achieve Goals: Good ADL Goals Pt Will Perform Grooming: with min assist;standing Pt Will Perform Upper Body Bathing: with min assist;sitting Pt Will Perform Lower Body Bathing: with min assist;sitting/lateral leans;sit to/from stand Pt Will Perform Lower Body Dressing: with min assist;sitting/lateral leans;with adaptive equipment;sit to/from stand Pt Will Transfer to Toilet: with min assist;stand pivot transfer;bedside commode Pt/caregiver will Perform Home Exercise Program: Increased strength;Both right and left upper extremity;With theraband;With written HEP provided;With Supervision Additional ADL Goal #1: Pt to demonstrate implementation of at least 2 energy conservation strategies during ADLs  OT Frequency: Min 2X/week   Barriers to D/C:            Co-evaluation              AM-PAC OT "6 Clicks" Daily Activity     Outcome Measure Help from another person eating meals?: A Little Help from another person taking care of personal grooming?: A Lot Help from another person toileting, which includes using toliet, bedpan, or urinal?: Total Help from another person bathing (including washing, rinsing, drying)?: A Lot Help from another person to put on and taking off regular upper body clothing?: A Lot  Help from another person to put on and taking off regular lower body clothing?: Total 6 Click Score: 11   End of Session Equipment Utilized During Treatment: Oxygen Nurse Communication: Mobility status  Activity Tolerance: Patient limited by fatigue;Patient limited by lethargy Patient left: in chair;with call bell/phone within reach;with chair alarm  set  OT Visit Diagnosis: Unsteadiness on feet (R26.81);Other abnormalities of gait and mobility (R26.89);Muscle weakness (generalized) (M62.81)                Time: [id number] OT Time Calculation (min): 12 min Charges:  OT General Charges $OT Visit: 1 Visit OT Evaluation $OT Eval Moderate Complexity: 1 Mod  Malachy Chamber, OTR/L Acute Rehab Services Office: [phone]  Layla Maw 05/10/2020, 10:35 AM

## 2020-05-11 ENCOUNTER — Other Ambulatory Visit: Payer: Self-pay

## 2020-05-11 ENCOUNTER — Inpatient Hospital Stay (HOSPITAL_COMMUNITY): Payer: Medicare Other

## 2020-05-11 DIAGNOSIS — I82409 Acute embolism and thrombosis of unspecified deep veins of unspecified lower extremity: Secondary | ICD-10-CM

## 2020-05-11 DIAGNOSIS — R053 Chronic cough: Secondary | ICD-10-CM | POA: Diagnosis not present

## 2020-05-11 DIAGNOSIS — U071 COVID-19: Secondary | ICD-10-CM

## 2020-05-11 DIAGNOSIS — R6510 Systemic inflammatory response syndrome (SIRS) of non-infectious origin without acute organ dysfunction: Secondary | ICD-10-CM

## 2020-05-11 DIAGNOSIS — I5033 Acute on chronic diastolic (congestive) heart failure: Secondary | ICD-10-CM | POA: Diagnosis not present

## 2020-05-11 DIAGNOSIS — A419 Sepsis, unspecified organism: Secondary | ICD-10-CM | POA: Diagnosis not present

## 2020-05-11 DIAGNOSIS — J449 Chronic obstructive pulmonary disease, unspecified: Secondary | ICD-10-CM | POA: Diagnosis not present

## 2020-05-11 DIAGNOSIS — J189 Pneumonia, unspecified organism: Secondary | ICD-10-CM | POA: Diagnosis not present

## 2020-05-11 DIAGNOSIS — Z515 Encounter for palliative care: Secondary | ICD-10-CM

## 2020-05-11 DIAGNOSIS — Z7189 Other specified counseling: Secondary | ICD-10-CM

## 2020-05-11 LAB — CBC WITH DIFFERENTIAL/PLATELET
Abs Immature Granulocytes: 0.09 10*3/uL — ABNORMAL HIGH (ref 0.00–0.07)
Basophils Absolute: 0 10*3/uL (ref 0.0–0.1)
Basophils Relative: 0 %
Eosinophils Absolute: 0 10*3/uL (ref 0.0–0.5)
Eosinophils Relative: 1 %
HCT: 29.8 % — ABNORMAL LOW (ref 39.0–52.0)
Hemoglobin: 9.5 g/dL — ABNORMAL LOW (ref 13.0–17.0)
Immature Granulocytes: 2 %
Lymphocytes Relative: 10 %
Lymphs Abs: 0.4 10*3/uL — ABNORMAL LOW (ref 0.7–4.0)
MCH: 28 pg (ref 26.0–34.0)
MCHC: 31.9 g/dL (ref 30.0–36.0)
MCV: 87.9 fL (ref 80.0–100.0)
Monocytes Absolute: 0.1 10*3/uL (ref 0.1–1.0)
Monocytes Relative: 4 %
Neutro Abs: 3.2 10*3/uL (ref 1.7–7.7)
Neutrophils Relative %: 83 %
Platelets: 193 10*3/uL (ref 150–400)
RBC: 3.39 MIL/uL — ABNORMAL LOW (ref 4.22–5.81)
RDW: 18 % — ABNORMAL HIGH (ref 11.5–15.5)
WBC: 3.9 10*3/uL — ABNORMAL LOW (ref 4.0–10.5)
nRBC: 0 % (ref 0.0–0.2)

## 2020-05-11 LAB — C-REACTIVE PROTEIN: CRP: 10.5 mg/dL — ABNORMAL HIGH (ref <1.0)

## 2020-05-11 LAB — COMPREHENSIVE METABOLIC PANEL
ALT: 17 U/L (ref 0–44)
AST: 25 U/L (ref 15–41)
Albumin: 1.9 g/dL — ABNORMAL LOW (ref 3.5–5.0)
Alkaline Phosphatase: 74 U/L (ref 38–126)
Anion gap: 9 (ref 5–15)
BUN: 17 mg/dL (ref 8–23)
CO2: 24 mmol/L (ref 22–32)
Calcium: 9.3 mg/dL (ref 8.9–10.3)
Chloride: 103 mmol/L (ref 98–111)
Creatinine, Ser: 1.23 mg/dL (ref 0.61–1.24)
GFR, Estimated: >60 mL/min (ref >60)
Glucose, Bld: 177 mg/dL — ABNORMAL HIGH (ref 70–99)
Potassium: 3.8 mmol/L (ref 3.5–5.1)
Sodium: 136 mmol/L (ref 135–145)
Total Bilirubin: 0.9 mg/dL (ref 0.3–1.2)
Total Protein: 4.9 g/dL — ABNORMAL LOW (ref 6.5–8.1)

## 2020-05-11 LAB — URINE CULTURE: Culture: NO GROWTH

## 2020-05-11 LAB — MAGNESIUM: Magnesium: 1.8 mg/dL (ref 1.7–2.4)

## 2020-05-11 LAB — BRAIN NATRIURETIC PEPTIDE: B Natriuretic Peptide: 122.6 pg/mL — ABNORMAL HIGH (ref 0.0–100.0)

## 2020-05-11 LAB — D-DIMER, QUANTITATIVE: D-Dimer, Quant: 2.75 ug/mL-FEU — ABNORMAL HIGH (ref 0.00–0.50)

## 2020-05-11 MED ORDER — ENOXAPARIN SODIUM 100 MG/ML ~~LOC~~ SOLN
90.0000 mg | Freq: Two times a day (BID) | SUBCUTANEOUS | Status: DC
  Administered 2020-05-12 – 2020-05-16 (×9): 90 mg via SUBCUTANEOUS
  Filled 2020-05-11 (×10): qty 1

## 2020-05-11 MED ORDER — HYDROCORTISONE NA SUCCINATE PF 100 MG IJ SOLR
50.0000 mg | Freq: Two times a day (BID) | INTRAMUSCULAR | Status: DC
  Administered 2020-05-11 – 2020-05-12 (×2): 50 mg via INTRAVENOUS
  Filled 2020-05-11 (×2): qty 2

## 2020-05-11 MED ORDER — ENOXAPARIN SODIUM 60 MG/0.6ML ~~LOC~~ SOLN
50.0000 mg | SUBCUTANEOUS | Status: AC
  Administered 2020-05-11: 50 mg via SUBCUTANEOUS
  Filled 2020-05-11: qty 0.6

## 2020-05-11 MED ORDER — HYDROCORTISONE NA SUCCINATE PF 100 MG IJ SOLR: 50.0000 mg | Freq: Three times a day (TID) | INTRAMUSCULAR | Status: DC

## 2020-05-11 NOTE — Progress Notes (Signed)
PROGRESS NOTE                                                                                                                                                                                                             Patient Demographics:    Tyrone Roman, is a 75 y.o. male, DOB - 1945-08-15, NMM:[id number]  Outpatient Primary MD for the patient is [name], MD    LOS - 2  Admit date - 05/09/2020    Chief Complaint  Patient presents with  . Tachycardia       Brief Narrative (HPI from H&P)   - Tyrone Roman is a 75 y.o. male with medical history significant of RA; CAD s/p stent; HTN; HLD; COPD; and BPH presenting with tachycardia.  He can't remember what happened, work-up here suggested hypoxia CHF/pneumonia of care.   Subjective:    [name] today has, No headache, No chest pain, No abdominal pain - No Nausea, No new weakness tingling or numbness, improved SOB.  He did complain of some right ankle pain.   Assessment  & Plan :     Acute Hypoxic Resp. Failure due to Acute on Chronic dCHF EF 60%  -Findings consistent with acute on chronic diastolic CHF low protein levels causing intravascular dehydration and third spacing. -Improved with IV diuresis, will hold diuresis currently due to SIRS  SIRS -Overnight patient was febrile, and hypotensive, and he is immunosuppressed due to his medication from noted arthritis. -We will decrease stress dose steroids IV hydrocortisone from 100 mg 3 times daily to 50 mg twice daily. -UA and chest x-ray with no evidence of infection. -With IV vancomycin ,Zosyn levofloxacin for atypical coverage. follow blood cultures   SpO2: 91 % O2 Flow Rate (L/min): 2 L/min FiO2 (%): 28 %  Recent Labs  Lab 05/09/20 1212 05/09/20 1621 05/10/20 0106 05/10/20 0807 05/11/20 0131  WBC 8.3  --  6.1  --  3.9*  HGB 11.3*  --  8.5*  --  9.5*  HCT 35.9*  --  27.5*  --  29.8*  PLT 187  --   140*  --  193  CRP  --   --   --  11.2* 10.5*  BNP  --   --   --  107.5* 122.6*  DDIMER  --   --   --  3.48* 2.75*  PROCALCITON <0.10  --   --   --   --   AST 44*  --   --   --  25  ALT 22  --   --   --  17  ALKPHOS 101  --   --   --  74  BILITOT 1.5*  --   --   --  0.9  ALBUMIN 2.4*  --   --   --  1.9*  INR 1.2  --   --   --   --   LATICACIDVEN 3.3* 1.6  --   --   --   SARSCOV2NAA POSITIVE*  --   --   --   --      Deconditioning, possible early dementia. Patient has had two recent admissions to the hospital . Supportive care with PT OT. At risk for delirium, minimize narcotics and benzodiazepines.   Metabolic encephalopathy. Likely also has undiagnosed early dementia, as in #2 above.  Acute on Chronic diastolic CHF recent EF 55 to 60%. Continue low-dose beta-blocker and rest as in #1 above.  BPH. On Flomax.  Dyslipidemia. On statin continue.  History of Rheumatoid arthritis. Takes Rituxan every 4 months.  HTN - blood pressure slightly on the lower side with tachycardia, on low-dose beta-blocker if blood pressure can tolerate, reviewed old chart, few months ago he had an episode of adrenal insufficiency, will challenge him with stress dose steroids as well.  Elevated D-dimer.  Chest pain, could be due to inflammation, check leg ultrasound and continue prophylactic dose Lovenox for now.       Condition - Extremely Guarded  Family Communication  : Discussed with daughter at bedside  Code Status : Full code, confirmed by patient and daughter  Consults  :  Pall.Care  PUD Prophylaxis : PPI   Procedures  :     Leg ultrasound.      Disposition Plan  :    Status is: Inpatient  Remains inpatient appropriate because:IV treatments appropriate due to intensity of illness or inability to take PO   Dispo: The patient is from: SNF              Anticipated d/c is to: SNF              Patient currently is not medically stable to d/c.   Difficult to place patient  No   DVT Prophylaxis  :  Lovenox   Lab Results  Component Value Date   PLT 193 05/11/2020    Diet :  Diet Order            Diet regular Room service appropriate? Yes; Fluid consistency: Thin  Diet effective now                  Inpatient Medications  Scheduled Meds: . aspirin  81 mg Oral Daily  . budesonide  0.5 mg Nebulization BID  . Chlorhexidine Gluconate Cloth  6 each Topical Q0600  . enoxaparin (LOVENOX) injection  40 mg Subcutaneous Q24H  . hydrocortisone sod succinate (SOLU-CORTEF) inj  100 mg Intravenous Q8H  . lactose free nutrition  237 mL Oral TID WC  . melatonin  5 mg Oral QHS  . metoprolol tartrate  25 mg Oral BID  . midodrine  5 mg Oral TID WC  . mometasone-formoterol  2 puff Inhalation BID  . mupirocin ointment  1 application Nasal BID  . pantoprazole  40 mg Oral Daily  . simvastatin  20 mg Oral Daily  . sodium chloride flush  3 mL Intravenous Q12H  . tamsulosin  0.4 mg Oral Daily  . umeclidinium bromide  1 puff Inhalation Daily   Continuous Infusions: . sodium chloride    . piperacillin-tazobactam (ZOSYN)  IV 3.375 g (05/11/20 0856)  . vancomycin Stopped (05/10/20 1925)   PRN Meds:.sodium chloride, acetaminophen **OR** [DISCONTINUED] acetaminophen, albuterol, lip balm, metoprolol tartrate, [DISCONTINUED] ondansetron **OR** ondansetron (ZOFRAN) IV  Antibiotics  :    Anti-infectives (From admission, onward)   Start     Dose/Rate Route Frequency Ordered Stop   05/10/20 1700  vancomycin (VANCOREADY) IVPB 1500 mg/300 mL        1,500 mg 150 mL/hr over 120 Minutes Intravenous Every 24 hours 05/10/20 1613     05/10/20 1700  piperacillin-tazobactam (ZOSYN) IVPB 3.375 g        3.375 g 12.5 mL/hr over 240 Minutes Intravenous Every 8 hours 05/10/20 1613     05/10/20 1400  ceFEPIme (MAXIPIME) 2 g in sodium chloride 0.9 % 100 mL IVPB  Status:  Discontinued        2 g 200 mL/hr over 30 Minutes Intravenous Every 8 hours 05/10/20 1002 05/10/20 1108    05/10/20 1330  vancomycin (VANCOREADY) IVPB 1500 mg/300 mL  Status:  Discontinued        1,500 mg 150 mL/hr over 120 Minutes Intravenous Every 24 hours 05/09/20 1355 05/10/20 1108   05/10/20 1215  levofloxacin (LEVAQUIN) tablet 750 mg  Status:  Discontinued        750 mg Oral Daily 05/10/20 1115 05/10/20 2003   05/10/20 0100  ceFEPIme (MAXIPIME) 2 g in sodium chloride 0.9 % 100 mL IVPB  Status:  Discontinued        2 g 200 mL/hr over 30 Minutes Intravenous Every 12 hours 05/09/20 1355 05/10/20 1002   05/09/20 1230  ceFEPIme (MAXIPIME) 2 g in sodium chloride 0.9 % 100 mL IVPB        2 g 200 mL/hr over 30 Minutes Intravenous  Once 05/09/20 1227 05/09/20 1324   05/09/20 1230  vancomycin (VANCOREADY) IVPB 2000 mg/400 mL        2,000 mg 200 mL/hr over 120 Minutes Intravenous  Once 05/09/20 1228 05/09/20 1546       Time Spent in minutes  9   Emeline Gins [facility] M.D on 05/11/2020 at 2:17 PM  To page go to [email or web address]   [facility] -  Office  [phone]    See all Orders from today for further details    Objective:   Vitals:   05/11/20 0410 05/11/20 0817 05/11/20 1101 05/11/20 1156  BP:  115/82  103/67  Pulse: 67 79  77  Resp: 14 19  20   Temp:  98 F (36.7 C)  98 F (36.7 C)  TempSrc:  Axillary  Oral  SpO2: 95% 92% 91% 91%  Weight:      Height:        Wt Readings from Last 3 Encounters:  05/09/20 88 kg  05/05/20 88.5 kg  04/26/20 88.5 kg     Intake/Output Summary (Last 24 hours) at 05/11/2020 1417 Last data filed at 05/11/2020 1025 Gross per 24 hour  Intake 871.95 ml  Output 1680 ml  Net -808.05 ml     Physical Exam  Awake Alert, Oriented X 3, No new F.N deficits, Normal affect Symmetrical Chest wall movement, Good air movement bilaterally, bibasilar Rales RRR,No Gallops,Rubs or new Murmurs, No Parasternal Heave +ve B.Sounds, Abd  Soft, No tenderness, No rebound - guarding or rigidity. No Cyanosis, Clubbing or edema, No new Rash or bruise     Data  Review:    CBC Recent Labs  Lab 05/09/20 1212 05/10/20 0106 05/11/20 0131  WBC 8.3 6.1 3.9*  HGB 11.3* 8.5* 9.5*  HCT 35.9* 27.5* 29.8*  PLT 187 140* 193  MCV 89.5 90.2 87.9  MCH 28.2 27.9 28.0  MCHC 31.5 30.9 31.9  RDW 18.2* 18.2* 18.0*  LYMPHSABS 0.4*  --  0.4*  MONOABS 0.5  --  0.1  EOSABS 0.2  --  0.0  BASOSABS 0.0  --  0.0    Recent Labs  Lab 05/09/20 1212 05/09/20 1621 05/10/20 0106 05/10/20 0807 05/11/20 0131  NA 136  --  135  --  136  K 4.4  --  3.6  --  3.8  CL 101  --  107  --  103  CO2 22  --  21*  --  24  GLUCOSE 128*  --  143*  --  177*  BUN 19  --  17  --  17  CREATININE 1.48*  --  0.98  --  1.23  CALCIUM 9.8  --  9.1  --  9.3  AST 44*  --   --   --  25  ALT 22  --   --   --  17  ALKPHOS 101  --   --   --  74  BILITOT 1.5*  --   --   --  0.9  ALBUMIN 2.4*  --   --   --  1.9*  MG  --   --   --  1.4* 1.8  CRP  --   --   --  11.2* 10.5*  DDIMER  --   --   --  3.48* 2.75*  PROCALCITON <0.10  --   --   --   --   LATICACIDVEN 3.3* 1.6  --   --   --   INR 1.2  --   --   --   --   TSH  --   --   --  1.040  --   BNP  --   --   --  107.5* 122.6*    ------------------------------------------------------------------------------------------------------------------ No results for input(s): CHOL, HDL, LDLCALC, TRIG, CHOLHDL, LDLDIRECT in the last 72 hours.  Lab Results  Component Value Date   HGBA1C 7.7 (H) 04/01/2020   ------------------------------------------------------------------------------------------------------------------ Recent Labs    05/10/20 0807  TSH 1.040    Cardiac Enzymes No results for input(s): CKMB, TROPONINI, MYOGLOBIN in the last 168 hours.  Invalid input(s): CK ------------------------------------------------------------------------------------------------------------------    Component Value Date/Time   BNP 122.6 (H) 05/11/2020 0131    Micro Results Recent Results (from the past 240 hour(s))  Urine culture      Status: None   Collection Time: 05/09/20  2:07 AM   Specimen: In/Out Cath Urine  Result Value Ref Range Status   Specimen Description IN/OUT CATH URINE  Final   Special Requests NONE  Final   Culture   Final    NO GROWTH Performed at [facility], [address] [place], [place] [address]    Report Status 05/11/2020 FINAL  Final  Resp Panel by RT-PCR (Flu A&B, Covid) Nasopharyngeal Swab     Status: Abnormal   Collection Time: 05/09/20 12:12 PM   Specimen: Nasopharyngeal Swab; Nasopharyngeal(NP) swabs in vial transport medium  Result Value Ref Range Status  SARS Coronavirus 2 by RT PCR POSITIVE (A) NEGATIVE Final    Comment: RESULT CALLED TO, READ BACK BY AND VERIFIED WITH: RN P [facility] [id number] AT 5027 BY CM (NOTE) SARS-CoV-2 target nucleic acids are DETECTED.  The SARS-CoV-2 RNA is generally detectable in upper respiratory specimens during the acute phase of infection. Positive results are indicative of the presence of the identified virus, but do not rule out bacterial infection or co-infection with other pathogens not detected by the test. Clinical correlation with patient history and other diagnostic information is necessary to determine patient infection status. The expected result is Negative.  Fact Sheet for Patients: EntrepreneurPulse.com.au  Fact Sheet for Healthcare Providers: IncredibleEmployment.be  This test is not yet approved or cleared by the [place] FDA and  has been authorized for detection and/or diagnosis of SARS-CoV-2 by FDA under an Emergency Use Authorization (EUA).  This EUA will remain in effect (meaning this test can be u sed) for the duration of  the COVID-19 declaration under Section 564(b)(1) of the Act, 21 U.S.C. section 360bbb-3(b)(1), unless the authorization is terminated or revoked sooner.     Influenza A by PCR NEGATIVE NEGATIVE Final   Influenza B by PCR NEGATIVE NEGATIVE Final    Comment:  (NOTE) The Xpert Xpress SARS-CoV-2/FLU/RSV plus assay is intended as an aid in the diagnosis of influenza from Nasopharyngeal swab specimens and should not be used as a sole basis for treatment. Nasal washings and aspirates are unacceptable for Xpert Xpress SARS-CoV-2/FLU/RSV testing.  Fact Sheet for Patients: EntrepreneurPulse.com.au  Fact Sheet for Healthcare Providers: IncredibleEmployment.be  This test is not yet approved or cleared by the [place] FDA and has been authorized for detection and/or diagnosis of SARS-CoV-2 by FDA under an Emergency Use Authorization (EUA). This EUA will remain in effect (meaning this test can be used) for the duration of the COVID-19 declaration under Section 564(b)(1) of the Act, 21 U.S.C. section 360bbb-3(b)(1), unless the authorization is terminated or revoked.  Performed at [facility], [place] [address] [place], [place] [address]   Blood Culture (routine x 2)     Status: None (Preliminary result)   Collection Time: 05/09/20 12:12 PM   Specimen: BLOOD  Result Value Ref Range Status   Specimen Description BLOOD SITE NOT SPECIFIED  Final   Special Requests   Final    BOTTLES DRAWN AEROBIC AND ANAEROBIC Blood Culture adequate volume   Culture   Final    NO GROWTH < 24 HOURS Performed at [facility], [place] [address], [place], [place] [address]    Report Status PENDING  Incomplete  Blood Culture (routine x 2)     Status: None (Preliminary result)   Collection Time: 05/09/20 12:12 PM   Specimen: BLOOD  Result Value Ref Range Status   Specimen Description BLOOD BLOOD LEFT HAND  Final   Special Requests   Final    BOTTLES DRAWN AEROBIC AND ANAEROBIC Blood Culture adequate volume   Culture   Final    NO GROWTH < 24 HOURS Performed at [facility], [place] [address], [place], [place] [address]    Report Status PENDING  Incomplete  MRSA PCR Screening     Status: Abnormal   Collection  Time: 05/09/20 12:30 PM   Specimen: Nasal Mucosa; Nasopharyngeal  Result Value Ref Range Status   MRSA by PCR POSITIVE (A) NEGATIVE Final    Comment:        The GeneXpert MRSA Assay (FDA approved  for NASAL specimens only), is one component of a comprehensive MRSA colonization surveillance program. It is not intended to diagnose MRSA infection nor to guide or monitor treatment for MRSA infections. RESULT CALLED TO, READ BACK BY AND VERIFIED WITH: IDOL,K RN AT 2212 05/09/2020 MITCHELL,L Performed at [facility], [place] [address], [place], [place] [address]     Radiology Reports DG Chest [place] 1 View  Result Date: 05/10/2020 CLINICAL DATA:  Short of breath, history of COPD EXAM: PORTABLE CHEST 1 VIEW COMPARISON:  Prior chest x-ray dated yesterday FINDINGS: Stable cardiac and mediastinal contours. Marked interval increase in diffuse interstitial prominence bilaterally now with Kerley B-lines in the periphery. Findings are most consistent with worsening CHF. No large effusion or pneumothorax. No acute osseous abnormality. IMPRESSION: Increasing interstitial pulmonary edema concerning for progressive CHF. Electronically Signed   By: Jacqulynn Cadet M.D.   On: 05/10/2020 07:54   DG Chest Port 1 View  Result Date: 05/09/2020 CLINICAL DATA:  Possible sepsis Fever and cough EXAM: PORTABLE CHEST 1 VIEW COMPARISON:  04/04/2020 FINDINGS: Heart size within normal limits. No pulmonary vascular congestion. Interval improvement in aeration of the lungs with bilateral airspace opacities still remaining. IMPRESSION: Interval improvement in aeration of the lungs with bilateral opacities still remaining consistent with resolving pneumonia. Electronically Signed   By: Miachel Roux M.D.   On: 05/09/2020 12:56

## 2020-05-11 NOTE — Progress Notes (Addendum)
Bilateral lower extremity venous duplex and IVC/ iliac duplex completed. Refer to "CV Proc" under chart review to view preliminary results.  Preliminary results discussed with Dr. Waldron Labs.  05/11/2020 4:46 PM Kelby Aline., MHA, RVT, RDCS, RDMS

## 2020-05-11 NOTE — Consult Note (Signed)
Consultation Note Date: 05/11/2020   Patient Name: Tyrone Roman  DOB: 02-Nov-1945  MRN: [id number]  Age / Sex: 75 y.o., male   PCP: [name], MD Referring Physician: Albertine Patricia, MD   REASON FOR CONSULTATION:Establishing goals of care  Palliative Care consult requested for goals of care discussion in this 75 y.o. male with a medical history significant for hypertension, hyperlipidemia, RA (Rituxan every 4 months), CAD s/p stent, COPD, COVID, MI, dCHF, and BPH. He presented to the ER via EMS from [facility] with complaint of tachycardia.  Recent Chest x-ray showed increasing interstitial pulmonary edema concerning for progressive CHF.  Clinical Assessment and Goals of Care: I have reviewed medical records including lab results, imaging, Epic notes, and MAR, received report from the bedside RN, and assessed the patient.   I met at the bedside with patient to discuss diagnosis prognosis, GOC, EOL wishes, disposition and options. He is awake, alert and oriented x3. States although he is unsure of all of the events leading to his hospitalization he understands current condition and plan.   I introduced Palliative Medicine as specialized medical care for people living with serious illness. It focuses on providing relief from the symptoms and stress of a serious illness. The goal is to improve quality of life for both the patient and the family.  We discussed a brief life review of the patient, along with his functional and nutritional status. Patient reports he has been married and divorced x2. He has 4 daughters who is close with. He speaks of their success and his daughter's upcoming graduation in May. 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 He lived in the home alone prior to recent illness.   Prior to admission he reports being placed at [facility] facility for rehab due to his deconditioning due to hospitalizations  and COVID. He reports he required some assistance with ADLs due to fatigue and weakness. He was ambulatory with a rolling walker and would use a wheelchair. Reports appetite has been good.    We discussed His current illness and what it means in the larger context of His on-going co-morbidities. Natural disease trajectory and expectations at EOL were discussed. He verbalized understanding of current illness.   Values and goals of care important to patient and family were attempted to be elicited.   Lunden shares he knows his health has worsened over the past several months, however he remains hopeful for some improvement and/or stability. He states he is not ready to give up on life and wishes to continue to live and receive all care to allow this. He states his daughters are most important to him and he has to make it to see his daughter graduate from [employer] in May.  A detailed discussion was had today regarding advanced directives.  Concepts specific to code status, artifical feeding and hydration, continued IV antibiotics and rehospitalization. Patient was recently agreed to DNR and later requested to be a full code. He reports he does have an advanced directive and daughter, [name] is his HCPOA.   We discussed at length his full code status with consideration of his current illness and co-morbidities. 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 We discussed what DNR means. He verbalized understanding confirming wishes to remain a full code.     I discussed the importance of continued conversation with family and their medical providers regarding overall plan of care and treatment options, ensuring decisions are within the context of the patients values and GOCs.  Hospice and Palliative Care services outpatient were  explained and offered. Patient verbalized his understanding and awareness of both palliative and hospice's goals and philosophy of care. He states he would need to further discuss with his daughter with a request for continued support while hospitalized.   Questions and concerns were addressed. Patient was encouraged to call with questions or concerns.  PMT will continue to support holistically as needed.  CODE STATUS: Full code  ADVANCE DIRECTIVES: Primary Decision Maker: Patient and daughter, [name] [facility])   SYMPTOM MANAGEMENT: per attending   Palliative Prophylaxis:   Aspiration, Delirium Protocol, Frequent Pain Assessment, Oral Care and Turn Reposition  PSYCHO-SOCIAL/SPIRITUAL:  Support System: Family  Desire for further Chaplaincy support:No   Additional Recommendations (Limitations, Scope, Preferences):  Full Scope Treatment  Education on hospice/palliative    PAST MEDICAL HISTORY: Past Medical History:  Diagnosis Date  . Allergy   . Barrett's esophageal ulceration   . BPH (benign prostatic hyperplasia)   . CAD (coronary artery disease)    sees Dr. Mar Daring  . Cataract    both eyes removed   . Colonic polyp   . Concussion with loss of consciousness of 30 minutes or less   . Contact dermatitis   . Contact with or exposure to venereal diseases   . COPD (chronic obstructive pulmonary disease) ([place])    sees Dr. Kara Mead   . Diverticulosis of colon   . Dysphagia   . GERD (gastroesophageal reflux disease)    past hx- on meds   . HNP (herniated nucleus pulposus), lumbar    recurrent  . Hyperlipidemia   . Hypertension   . Myocardial infarction [facility])    2011- stent placed  . Nephrolithiasis   . Rheumatoid arthritis [facility])    sees Dr. Gavin Pound   . SOB (shortness of breath)     ALLERGIES:  is allergic to cephalexin, clarithromycin, certolizumab pegol, tocilizumab, doxycycline, hydroxyzine, lidoderm, and pyrithione zinc.   MEDICATIONS:  Current  Facility-Administered Medications  Medication Dose Route Frequency Provider Last Rate Last Admin  . 0.9 %  sodium chloride infusion   Intravenous PRN Thurnell Lose, MD      . acetaminophen (TYLENOL) tablet 650 mg  650 mg Oral Q6H PRN Karmen Bongo, MD   650 mg at 05/10/20 2014  . albuterol (VENTOLIN HFA) 108 (90 Base) MCG/ACT inhaler 2 puff  2 puff Inhalation Q4H PRN Karmen Bongo, MD      . aspirin chewable tablet 81 mg  81 mg Oral Daily Karmen Bongo, MD   81 mg at 05/11/20 0900  . budesonide (PULMICORT) nebulizer solution 0.5 mg  0.5 mg Nebulization BID Karmen Bongo, MD   0.5 mg at 05/11/20 1101  . Chlorhexidine Gluconate Cloth 2 % PADS 6 each  6 each Topical Q0600 Karmen Bongo, MD   6 each at 05/11/20 0600  . enoxaparin (LOVENOX) injection 40 mg  40 mg Subcutaneous Q24H Karmen Bongo, MD   40 mg at 05/10/20 1600  . hydrocortisone sodium succinate (SOLU-CORTEF) 100 MG injection 50 mg  50 mg Intravenous Q12H Elgergawy, Silver Huguenin, MD      . lactose free nutrition (BOOST PLUS) liquid  237 mL  237 mL Oral TID WC Thurnell Lose, MD   237 mL at 05/11/20 1203  . lip balm (CARMEX) ointment 1 application  1 application Topical PRN Thurnell Lose, MD      . melatonin tablet 5 mg  5 mg Oral Ivery Quale, MD   5 mg at 05/10/20 2212  . metoprolol tartrate (LOPRESSOR) injection 5 mg  5 mg Intravenous Q8H PRN Thurnell Lose, MD   5 mg at 05/10/20 1456  . metoprolol tartrate (LOPRESSOR) tablet 25 mg  25 mg Oral BID Thurnell Lose, MD   25 mg at 05/11/20 0900  . midodrine (PROAMATINE) tablet 5 mg  5 mg Oral TID WC Thurnell Lose, MD   5 mg at 05/11/20 1203  . mometasone-formoterol (DULERA) 200-5 MCG/ACT inhaler 2 puff  2 puff Inhalation BID Karmen Bongo, MD   2 puff at 05/11/20 1101  . mupirocin ointment (BACTROBAN) 2 % 1 application  1 application Nasal BID Karmen Bongo, MD   1 application at 16/10/96 0901  . ondansetron (ZOFRAN) injection 4 mg  4 mg Intravenous Q6H  PRN Karmen Bongo, MD      . pantoprazole (PROTONIX) EC tablet 40 mg  40 mg Oral Daily Karmen Bongo, MD   40 mg at 05/11/20 0900  . piperacillin-tazobactam (ZOSYN) IVPB 3.375 g  3.375 g Intravenous Q8H Lala Lund K, MD 12.5 mL/hr at 05/11/20 0856 3.375 g at 05/11/20 0856  . simvastatin (ZOCOR) tablet 20 mg  20 mg Oral Daily Karmen Bongo, MD   20 mg at 05/11/20 0901  . sodium chloride flush (NS) 0.9 % injection 3 mL  3 mL Intravenous Q12H Karmen Bongo, MD   3 mL at 05/11/20 0910  . tamsulosin (FLOMAX) capsule 0.4 mg  0.4 mg Oral Daily Thurnell Lose, MD   0.4 mg at 05/11/20 0901  . umeclidinium bromide (INCRUSE ELLIPTA) 62.5 MCG/INH 1 puff  1 puff Inhalation Daily Karmen Bongo, MD   1 puff at 05/11/20 1101  . vancomycin (VANCOREADY) IVPB 1500 mg/300 mL  1,500 mg Intravenous Q24H Thurnell Lose, MD   Stopped at 05/10/20 1925    VITAL SIGNS: BP 103/67 (BP Location: Right Arm)   Pulse 77   Temp 98 F (36.7 C) (Oral)   Resp 20   Ht '6\' 2"'  (1.88 m)   Wt 88 kg   SpO2 91%   BMI 24.91 kg/m  Filed Weights   05/09/20 1157  Weight: 88 kg    Estimated body mass index is 24.91 kg/m as calculated from the following:   Height as of this encounter: '6\' 2"'  (1.88 m).   Weight as of this encounter: 88 kg.  LABS: CBC:    Component Value Date/Time   WBC 3.9 (L) 05/11/2020 0131   HGB 9.5 (L) 05/11/2020 0131   HCT 29.8 (L) 05/11/2020 0131   PLT 193 05/11/2020 0131   Comprehensive Metabolic Panel:    Component Value Date/Time   NA 136 05/11/2020 0131   K 3.8 05/11/2020 0131   BUN 17 05/11/2020 0131   CREATININE 1.23 05/11/2020 0131   CREATININE 1.64 (H) 02/18/2018 1622   ALBUMIN 1.9 (L) 05/11/2020 0131     Review of Systems  Neurological: Positive for weakness.  Unless otherwise noted, a complete review of systems is negative.  Physical Exam General: NAD, chronically-ill appearing Cardiovascular: regular rate and rhythm Pulmonary: diminished bilaterally   Abdomen: soft, nontender, + bowel sounds Extremities: no edema, no  joint deformities Skin: no rashes, warm and dry Neurological: AAOx3, mood appropriate    Prognosis: Guarded   Discharge Planning:  To Be Determined  Recommendations: . Full Code-as confirmed by patient   . Continue with current plan of care . Patient remains hopeful for some improvement/stability. States goal is to return home and he and daughter are having ongoing discussions. Is not interested in withdrawal of care. Goal to treat the treatable with awareness of current illness and co-morbidities. States his goal is to continue with therapy (preferably in home) and hopes to attend his daughter's graduation in May.  . Recommendation provided for outpatient Palliative support. Patient would like to further discuss with his daughter. Palliative outpatient would be most appropriate to continue ongoing goals of care discussions given co-morbidities and disease trajectory. Patient does request ongoing inpatient support.  Marland Kitchen PMT will continue to support and follow as needed. Please call team line with urgent needs.   Palliative Performance Scale: PPS 30%              Patient expressed understanding and was in agreement with this plan.   Thank you for allowing the Palliative Medicine Team to assist in the care of this patient. Please utilize secure chat with additional questions, if there is no response within 30 minutes please call the above phone number.   Time In: 1350 Time Out: 1445 Time Total: 55 min.   Visit consisted of counseling and education dealing with the complex and emotionally intense issues of symptom management and palliative care in the setting of serious and potentially life-threatening illness.Greater than 50%  of this time was spent counseling and coordinating care related to the above assessment and plan.  Signed by:  Alda Lea, AGPCNP-BC Palliative Medicine Team  Phone:  [phone] Pager: [phone] Amion: [facility] Team providers are available by phone from 7am to 7pm daily and can be reached through the team cell phone.  Should this patient require assistance outside of these hours, please call the patient's attending physician.

## 2020-05-11 NOTE — Progress Notes (Signed)
Pt eating breakfast at this time and request to come back at a later time with MDI and nebulizer. RRT will check back around 1100.

## 2020-05-11 NOTE — Plan of Care (Signed)
  Problem: Education: Goal: Knowledge of General Education information will improve Description: Including pain rating scale, medication(s)/side effects and non-pharmacologic comfort measures Outcome: Progressing   Problem: Health Behavior/Discharge Planning: Goal: Ability to manage health-related needs will improve Outcome: Progressing   Problem: Clinical Measurements: Goal: Ability to maintain clinical measurements within normal limits will improve Outcome: Progressing Goal: Will remain free from infection Outcome: Progressing Goal: Diagnostic test results will improve Outcome: Progressing Goal: Respiratory complications will improve Outcome: Progressing Goal: Cardiovascular complication will be avoided Outcome: Progressing   Problem: Activity: Goal: Risk for activity intolerance will decrease Outcome: Progressing   Problem: Nutrition: Goal: Adequate nutrition will be maintained Outcome: Progressing   Problem: Coping: Goal: Level of anxiety will decrease Outcome: Progressing   Problem: Elimination: Goal: Will not experience complications related to bowel motility Outcome: Progressing Goal: Will not experience complications related to urinary retention Outcome: Progressing   Problem: Pain Managment: Goal: General experience of comfort will improve Outcome: Progressing   Problem: Safety: Goal: Ability to remain free from injury will improve Outcome: Progressing   Problem: Skin Integrity: Goal: Risk for impaired skin integrity will decrease Outcome: Progressing   Problem: Education: Goal: Knowledge of disease or condition will improve Outcome: Progressing Goal: Knowledge of the prescribed therapeutic regimen will improve Outcome: Progressing Goal: Individualized Educational Video(s) Outcome: Progressing   Problem: Activity: Goal: Ability to tolerate increased activity will improve Outcome: Progressing Goal: Will verbalize the importance of balancing  activity with adequate rest periods Outcome: Progressing   Problem: Respiratory: Goal: Ability to maintain a clear airway will improve Outcome: Progressing Goal: Levels of oxygenation will improve Outcome: Progressing Goal: Ability to maintain adequate ventilation will improve Outcome: Progressing   Problem: Fluid Volume: Goal: Hemodynamic stability will improve Outcome: Progressing   Problem: Clinical Measurements: Goal: Diagnostic test results will improve Outcome: Progressing Goal: Signs and symptoms of infection will decrease Outcome: Progressing   Problem: Respiratory: Goal: Ability to maintain adequate ventilation will improve Outcome: Progressing   Problem: Activity: Goal: Ability to tolerate increased activity will improve Outcome: Progressing   Problem: Clinical Measurements: Goal: Ability to maintain a body temperature in the normal range will improve Outcome: Progressing   Problem: Respiratory: Goal: Ability to maintain adequate ventilation will improve Outcome: Progressing Goal: Ability to maintain a clear airway will improve Outcome: Progressing

## 2020-05-11 NOTE — Progress Notes (Signed)
.   ANTICOAGULATION CONSULT NOTE - Initial Consult  Pharmacy Consult for Lovenox Indication: DVT Treatment   Allergies  Allergen Reactions  . Cephalexin Shortness Of Breath, Rash and Other (See Comments)    Tolerated Augmentin 2015 and 2017 (12-2017). Tolerated nafcillin 12/2017 PATIENT HAS HAD A PCN REACTION WITH IMMEDIATE RASH, FACIAL/TONGUE/THROAT SWELLING, SOB, OR LIGHTHEADEDNESS WITH HYPOTENSION:  #  #  YES  #  #  Has patient had a PCN reaction causing severe rash involving mucus membranes or skin necrosis: No Has patient had a PCN reaction that required hospitalization: No Has patient had a PCN reaction occurring within the last 10 years: No If all of the above answers are "NO", then may proceed  . Clarithromycin Shortness Of Breath and Rash  . Certolizumab Pegol Hives  . Tocilizumab Hives  . Doxycycline Rash  . Hydroxyzine Rash  . Lidoderm Other (See Comments)    "made me act weird"  . Pyrithione Zinc Rash    Patient Measurements: Height: 6\' 2"  (188 cm) Weight: 88 kg (194 lb 0.1 oz) IBW/kg (Calculated) : 82.2 Heparin Dosing Weight: 88 kg  Vital Signs: Temp: 98 F (36.7 C) (04/06 1156) Temp Source: Oral (04/06 1156) BP: 103/67 (04/06 1156) Pulse Rate: 77 (04/06 1156)  Labs: Recent Labs    05/09/20 1212 05/10/20 0106 05/11/20 0131  HGB 11.3* 8.5* 9.5*  HCT 35.9* 27.5* 29.8*  PLT 187 140* 193  APTT 34  --   --   LABPROT 14.6  --   --   INR 1.2  --   --   CREATININE 1.48* 0.98 1.23    Estimated Creatinine Clearance: 61.3 mL/min (by C-G formula based on SCr of 1.23 mg/dL).   Medical History: Past Medical History:  Diagnosis Date  . Allergy   . Barrett's esophageal ulceration   . BPH (benign prostatic hyperplasia)   . CAD (coronary artery disease)    sees Dr. Mar Daring  . Cataract    both eyes removed   . Colonic polyp   . Concussion with loss of consciousness of 30 minutes or less   . Contact dermatitis   . Contact with or exposure to venereal  diseases   . COPD (chronic obstructive pulmonary disease) ([place])    sees Dr. Kara Mead   . Diverticulosis of colon   . Dysphagia   . GERD (gastroesophageal reflux disease)    past hx- on meds   . HNP (herniated nucleus pulposus), lumbar    recurrent  . Hyperlipidemia   . Hypertension   . Myocardial infarction [facility])    2011- stent placed  . Nephrolithiasis   . Rheumatoid arthritis [facility])    sees Dr. Gavin Pound   . SOB (shortness of breath)    Assessment: 75 yo male admitted on 4/4 with Acute Hyposix Resp. Failure. Had an elevated D-Dimer of of 3.48 on 4/5 and a D-Dimer of 2.75 on 4/6. Patient received prophylactic dose of Lovenox of 40mg  while an ultrasound was completed of his left leg. The D-Dimer came back consistent with DVT involving the left femoral vein, left popliteal vein, left posterior tibial veins, and left peroneal vein.   Weight used to calculate dose:88kg 4/6 SrCr >30   Goal of Therapy: Treatment of DVT Monitor platelets by anticoagulation protocol: Yes     Plan:  Lovenox 90mg  IV Q12h Monitor CBC Q72h Monitor s/s Bleeding   Horton Finer PharmD Candidate 05/11/2020,5:15 PM

## 2020-05-11 NOTE — Progress Notes (Signed)
   05/11/20 0600  Notify: Provider  Provider Name/Title Hal Hope, MD  Date Provider Notified 05/11/20  Time Provider Notified [phone]  Notification Type Page  Notification Reason Other (Comment) (Bladder scan of 311 ml. Patient denies urge to urinate.)  Provider response See new orders (Bladder scan in 4 hrs.)  Date of Provider Response 05/11/20  Time of Provider Response [phone] (MD called this RN.)

## 2020-05-11 NOTE — TOC Initial Note (Signed)
Transition of Care [facility]) - Initial/Assessment Note    Patient Details  Name: Tyrone Roman MRN: [id number] Date of Birth: August 26, 1945  Transition of Care [facility]) CM/SW Contact:    Tyrone Collet, RN Phone Number: 05/11/2020, 3:30 PM  Clinical Narrative:            Tyrone Roman w patient at bedside, he also granted permission to speak w his daughter. Patient states that he wants to DC to home when cleared. He lives at home alone, and his daughter is available for support. He states he plans on hiring extra private duty staff. I spoke w his daughter Ms Tyrone Roman over the phone. She is in agreement with him going back home. We discussed [place] services and providers. She requested [facility] if avaiable, any any 3 star or better company thereafter.  Referral accepted by [facility]. Discussed outpatient palliative services and both were in agreement to start, discussed providers and referral accepted by [facility]. Patient states he has RW and WC at home. Daughter not sure if they are accessible/ working. She will assess and reach out to staff or CM if DME needed prior to DC. Patient currently on O2, does not have O2 at home.  NEEDS HH orders  Watch for home oxygen needs, DME needs.         Expected Discharge Plan: [place] Barriers to Discharge: Continued Medical Work up   Patient Goals and CMS Choice Patient states their goals for this hospitalization and ongoing recovery are:: to go home CMS Medicare.gov Compare Post Acute Care list provided to:: Other (Comment Required) Choice offered to / list presented to : [facility]  Expected Discharge Plan and Services Expected Discharge Plan: [place]   Discharge Planning Services: CM Consult Post Acute Care Choice: [place] Living arrangements for the past 2 months: [place]: RN,PT,OT [place] Agency: [place] Date [facility] Agency  Contacted: 05/11/20 Time HH Agency Contacted: 80 Representative spoke with at [place]: Tyrone Roman  Prior Living Arrangements/Services Living arrangements for the past 2 months: [place] with:: Self   Do you feel safe going back to the place where you live?: Yes               Activities of Daily Living Home Assistive Devices/Equipment: Environmental consultant (specify type) ADL Screening (condition at time of admission) Patient's cognitive ability adequate to safely complete daily activities?: No Is the patient deaf or have difficulty hearing?: No Does the patient have difficulty seeing, even when wearing glasses/contacts?: No Does the patient have difficulty concentrating, remembering, or making decisions?: Yes Patient able to express need for assistance with ADLs?: Yes Does the patient have difficulty dressing or bathing?: Yes Independently performs ADLs?: No Communication: Independent Dressing (OT): Needs assistance Is this a change from baseline?: Change from baseline, expected to last >3 days Grooming: Needs assistance Is this a change from baseline?: Pre-admission baseline Feeding: Independent with device (comment) Bathing: Needs assistance Is this a change from baseline?: Change from baseline, expected to last <3 days Toileting: Needs assistance Is this a change from baseline?: Change from baseline, expected to last >3days In/Out Bed: Needs assistance Is this a change from baseline?: Change from baseline, expected to last <3 days Walks in Home: Independent with device (comment) Does the patient  have difficulty walking or climbing stairs?: Yes Weakness of Legs: Both Weakness of Arms/Hands: None  Permission Sought/Granted                  Emotional Assessment              Admission diagnosis:  Sepsis due to pneumonia ([place]) [J18.9, A41.9] Patient Active Problem List   Diagnosis Date Noted  . Sepsis due to pneumonia ([place]) 05/09/2020  . Post-COVID-19  syndrome manifesting as chronic cough 05/09/2020  . COPD without exacerbation ([place]) 05/09/2020  . Essential hypertension 04/26/2020  . Acute hypoxemic respiratory failure due to COVID-19 ([place]) 04/03/2020  . Acute respiratory failure with hypoxia ([place]) 03/28/2020  . Physical deconditioning 03/26/2020  . Normocytic anemia 03/26/2020  . Severe sepsis ([place]) 03/25/2020  . Multifocal pneumonia 03/17/2020  . COPD with acute exacerbation ([place]) 03/17/2020  . Hypoxia 03/17/2020  . CAD (coronary artery disease) 03/17/2020  . Thrombocytopenia ([place]) 03/08/2020  . Stage 3b chronic kidney disease ([place]) 03/08/2020  . Pneumonia due to COVID-19 virus 02/29/2020  . Acute respiratory failure due to COVID-19 ([place]) 02/29/2020  . Closed nondisplaced fracture of shaft of third metacarpal bone of left hand 03/04/2019  . Wound infection after surgery 01/01/2018  . Medication monitoring encounter 01/01/2018  . HNP (herniated nucleus pulposus), lumbar 10/07/2017  . Lumbar herniated disc 10/07/2017  . Psoriasis of scalp 03/27/2016  . Rheumatoid arthritis ([place]) 03/25/2013  . GI bleeding 02/20/2012  . Dyspnea on exertion 09/08/2010  . COPD (chronic obstructive pulmonary disease) ([place]) 08/04/2010  . Bruising 08/04/2010  . TINNITUS 12/16/2009  . Dizziness and giddiness 12/16/2009  . NECK SPRAIN AND STRAIN 10/21/2009  . LUMBAR SPRAIN AND STRAIN 10/21/2009  . Hyperlipidemia 06/10/2009  . Coronary artery disease of native artery of native heart with stable angina pectoris ([place]) 06/10/2009  . GERD 06/10/2009  . BARRETTS ESOPHAGUS 06/10/2009  . CONCUSSION WITH LOC OF 30 MINUTES OR LESS 02/28/2009  . NEPHROLITHIASIS 07/01/2007  . CONTACT DERMATITIS 12/12/2006  . BPH (benign prostatic hyperplasia) 11/04/2006  . COLONIC POLYPS 10/21/2003  . DIVERTICULOSIS, COLON 10/21/2003   PCP:  Tyrone Morale, MD Pharmacy:   [facility], [place] [address] Phone: [phone] Fax: [phone]     Social Determinants of Health (SDOH) Interventions    Readmission Risk Interventions No flowsheet data found.

## 2020-05-11 NOTE — Significant Event (Signed)
Patient requested his code status changed to Full Code.  Tyrone Roman.

## 2020-05-11 NOTE — Progress Notes (Signed)
[facility] ([facility])  Hospital Liaison RN note         Notified by TOC manager of patient/family request for [facility] Palliative services at home after discharge.              [facility] Palliative team will follow up with patient after discharge.         Please call with any hospice or palliative related questions.         Thank you for the opportunity to participate in this patient's care.     Chrislyn King, BSN, RN [facility] Hospital Liaison (listed on [facility] under Hospice/[facility])    [phone] (24h on call)    

## 2020-05-11 NOTE — Progress Notes (Signed)
   05/10/20 2130  External Urinary Catheter  Placement Date/Time: 05/10/20 2330   Person Inserting Catheter: Tanya, NT  Output (mL) 50 mL  Urine Characteristics  Bladder Scan Volume (mL) 380 mL   Patient denied urge to urinate. Hal Hope, MD notified and ordered In & out cath. This RN attempted to insert cath with assistance by Lavella Lemons, NT. Insertion unsuccessful d/t blockage. Bed position changed, but cath still unsuccessful. Hal Hope, MD notified and ordered for repeat attempt after an hour. This RN went to attempt cath again. Urine (430 ml) emptied from condom cath bag. Repeat bladder scan 244 ml. Hal Hope, MD notified and ordered to bladder scan again after 4-5 hrs. Will continue to monitor.

## 2020-05-12 DIAGNOSIS — I82402 Acute embolism and thrombosis of unspecified deep veins of left lower extremity: Secondary | ICD-10-CM

## 2020-05-12 DIAGNOSIS — A419 Sepsis, unspecified organism: Secondary | ICD-10-CM | POA: Diagnosis not present

## 2020-05-12 DIAGNOSIS — J189 Pneumonia, unspecified organism: Secondary | ICD-10-CM | POA: Diagnosis not present

## 2020-05-12 LAB — COMPREHENSIVE METABOLIC PANEL
ALT: 13 U/L (ref 0–44)
AST: 23 U/L (ref 15–41)
Albumin: 1.9 g/dL — ABNORMAL LOW (ref 3.5–5.0)
Alkaline Phosphatase: 63 U/L (ref 38–126)
Anion gap: 9 (ref 5–15)
BUN: 23 mg/dL (ref 8–23)
CO2: 22 mmol/L (ref 22–32)
Calcium: 9 mg/dL (ref 8.9–10.3)
Chloride: 102 mmol/L (ref 98–111)
Creatinine, Ser: 1.22 mg/dL (ref 0.61–1.24)
GFR, Estimated: >60 mL/min (ref >60)
Glucose, Bld: 292 mg/dL — ABNORMAL HIGH (ref 70–99)
Potassium: 3 mmol/L — ABNORMAL LOW (ref 3.5–5.1)
Sodium: 133 mmol/L — ABNORMAL LOW (ref 135–145)
Total Bilirubin: 0.4 mg/dL (ref 0.3–1.2)
Total Protein: 4.4 g/dL — ABNORMAL LOW (ref 6.5–8.1)

## 2020-05-12 LAB — HEMOGLOBIN A1C
Hgb A1c MFr Bld: 6.3 % — ABNORMAL HIGH (ref 4.8–5.6)
Mean Plasma Glucose: 134.11 mg/dL

## 2020-05-12 LAB — CBC WITH DIFFERENTIAL/PLATELET
Abs Immature Granulocytes: 0.11 10*3/uL — ABNORMAL HIGH (ref 0.00–0.07)
Basophils Absolute: 0 10*3/uL (ref 0.0–0.1)
Basophils Relative: 0 %
Eosinophils Absolute: 0 10*3/uL (ref 0.0–0.5)
Eosinophils Relative: 0 %
HCT: 28 % — ABNORMAL LOW (ref 39.0–52.0)
Hemoglobin: 8.9 g/dL — ABNORMAL LOW (ref 13.0–17.0)
Immature Granulocytes: 1 %
Lymphocytes Relative: 6 %
Lymphs Abs: 0.5 10*3/uL — ABNORMAL LOW (ref 0.7–4.0)
MCH: 27.9 pg (ref 26.0–34.0)
MCHC: 31.8 g/dL (ref 30.0–36.0)
MCV: 87.8 fL (ref 80.0–100.0)
Monocytes Absolute: 0.6 10*3/uL (ref 0.1–1.0)
Monocytes Relative: 7 %
Neutro Abs: 6.6 10*3/uL (ref 1.7–7.7)
Neutrophils Relative %: 86 %
Platelets: 210 10*3/uL (ref 150–400)
RBC: 3.19 MIL/uL — ABNORMAL LOW (ref 4.22–5.81)
RDW: 18 % — ABNORMAL HIGH (ref 11.5–15.5)
WBC: 7.7 10*3/uL (ref 4.0–10.5)
nRBC: 0 % (ref 0.0–0.2)

## 2020-05-12 LAB — GLUCOSE, CAPILLARY
Glucose-Capillary: 168 mg/dL — ABNORMAL HIGH (ref 70–99)
Glucose-Capillary: 241 mg/dL — ABNORMAL HIGH (ref 70–99)
Glucose-Capillary: 340 mg/dL — ABNORMAL HIGH (ref 70–99)
Glucose-Capillary: 133 mg/dL — ABNORMAL HIGH (ref 70–99)

## 2020-05-12 LAB — C-REACTIVE PROTEIN: CRP: 5.6 mg/dL — ABNORMAL HIGH (ref <1.0)

## 2020-05-12 LAB — MAGNESIUM: Magnesium: 1.7 mg/dL (ref 1.7–2.4)

## 2020-05-12 LAB — BRAIN NATRIURETIC PEPTIDE: B Natriuretic Peptide: 98.9 pg/mL (ref 0.0–100.0)

## 2020-05-12 LAB — D-DIMER, QUANTITATIVE: D-Dimer, Quant: 3.45 ug/mL-FEU — ABNORMAL HIGH (ref 0.00–0.50)

## 2020-05-12 MED ORDER — INSULIN ASPART 100 UNIT/ML ~~LOC~~ SOLN
0.0000 [IU] | Freq: Every day | SUBCUTANEOUS | Status: DC
  Administered 2020-05-17: 3 [IU] via SUBCUTANEOUS

## 2020-05-12 MED ORDER — MIDODRINE HCL 5 MG PO TABS
5.0000 mg | ORAL_TABLET | Freq: Two times a day (BID) | ORAL | Status: DC
  Administered 2020-05-12: 5 mg via ORAL
  Filled 2020-05-12: qty 1

## 2020-05-12 MED ORDER — INSULIN ASPART 100 UNIT/ML ~~LOC~~ SOLN
0.0000 [IU] | Freq: Three times a day (TID) | SUBCUTANEOUS | Status: DC
  Administered 2020-05-12: 5 [IU] via SUBCUTANEOUS
  Administered 2020-05-12: 11 [IU] via SUBCUTANEOUS
  Administered 2020-05-13: 5 [IU] via SUBCUTANEOUS
  Administered 2020-05-15: 3 [IU] via SUBCUTANEOUS
  Administered 2020-05-15 – 2020-05-16 (×4): 2 [IU] via SUBCUTANEOUS
  Administered 2020-05-17 (×2): 3 [IU] via SUBCUTANEOUS
  Administered 2020-05-17: 8 [IU] via SUBCUTANEOUS
  Administered 2020-05-18: 3 [IU] via SUBCUTANEOUS
  Administered 2020-05-18: 5 [IU] via SUBCUTANEOUS

## 2020-05-12 MED ORDER — POTASSIUM CHLORIDE CRYS ER 20 MEQ PO TBCR
40.0000 meq | EXTENDED_RELEASE_TABLET | Freq: Once | ORAL | Status: AC
  Administered 2020-05-12: 40 meq via ORAL
  Filled 2020-05-12: qty 2

## 2020-05-12 NOTE — Progress Notes (Signed)
  Speech Language Pathology Treatment: Dysphagia  Patient Details Name: Tyrone Roman MRN: [id number] DOB: 1945/03/23 Today's Date: 05/12/2020 Time: 0911-0940 SLP Time Calculation (min) (ACUTE ONLY): 29 min  Assessment / Plan / Recommendation Clinical Impression  Reviewed importance of pt performing incentive spirometer approx 10 times each hour awake.  Xerostomia noted - and pt did not find Biotene helpful.  Reviewed compensation strategies for xerostomia = and potential sources including oxygen, medications, etc.  Advised pt to speak to MD re: concerns.  SLP inquired re: potential speech, voice and expressive language changes - to which pt admits decreased vocal strength, and word finding deficits but denies articulation changes - Daughter Tyrone Roman present confirms - noting "brain fog" with COVID.  Observed pt consuming water without indication of aspiration or c/o dysphagia.  Pt was eating with his HOB lowered - and advised against this.  Pt able to slide up with [facility] lowered 0 noted to belch x3 after HOB elevated.  He states "That's the closest to refluxing I have had."      Due to xerostomia, pt should start meals with liquids and drink liquids during meals.  F/U as OP with Dr Silverio Decamp advised to assure clear with h/o Barrett's esophagus as pt reports being past due to endoscopy.  Thanks for allowing me to help with this pt's care plan.  All questions answered presented by pt and daughter.   HPI HPI: 75yo male admitted 05/09/20 with tachycardia. PMH: RA, CAD s/p stent, HTN, HLD, COPD, BPH. Barrett's esophagus, Dysphagia, Has had hospitalizations multiple months in a row. CXR = findings most consistent with worsening CHF.  SLP follow up to educate to esophageal precautions and assure managing po well.  Daughter Tyrone Roman present on 05/12/2020 and thus was educated to results/recommendations.  Pt admits to weight loss which he attributes to COVID.  He is on a PPI and had endoscopy done last 2016 that was  negative for Barrett's.  Pt denies any dypshagia or reflux.      SLP Plan  All goals met       Recommendations  Diet recommendations: Regular;Thin liquid Liquids provided via: [facility] Medication Administration: Whole meds with liquid Supervision: Patient able to self feed Compensations: Slow rate;Small sips/bites;Other (Comment) (start intake with liquid) Postural Changes and/or Swallow Maneuvers: Seated upright 90 degrees;Upright 30-60 min after meal                Oral Care Recommendations: Oral care BID Follow up Recommendations: Other (comment) (TBD) SLP Visit Diagnosis: Dysphagia, unspecified (R13.10) Plan: All goals met       GO                Tyrone Roman 05/12/2020, 9:54 AM  Kathleen Lime, MS [facility] SLP Acute Rehab Services Office [phone] Pager [phone]

## 2020-05-12 NOTE — Progress Notes (Signed)
PROGRESS NOTE                                                                                                                                                                                                             Patient Demographics:    Tyrone Roman, is a 75 y.o. male, DOB - Jun 29, 1945, MGQ:[id number]  Outpatient Primary MD for the patient is [name], MD    LOS - 3  Admit date - 05/09/2020    Chief Complaint  Patient presents with  . Tachycardia       Brief Narrative (HPI from H&P)    - Tyrone Roman is a 75 y.o. male with medical history significant of RA; CAD s/p stent; HTN; HLD; COPD; and BPH presenting with tachycardia.  He can't remember what happened, work-up here suggested hypoxia CHF/pneumonia of care.   Subjective:    Tyrone Roman today denies any fever, chills, reports appetite has improved, .   Assessment  & Plan :     Acute Hypoxic Resp. Failure due to Acute on Chronic dCHF EF 60%  -Findings consistent with acute on chronic diastolic CHF low protein levels causing intravascular dehydration and third spacing. -Improved with IV diuresis, he does appear to be euvolemic today, BNP has normalized today to 98.9. .  SIRS -4/6 evening patient was febrile, and hypotensive, and he is immunosuppressed due to his medication from rheumatoid arthritis. -UA and chest x-ray with no evidence of infection, presently negative, so we will continue IV vancomycin, will DC Zosyn 24 hours work-up remains negative. -We will DC IV hydrocortisone, given blood pressure has stabilized.  Acute DVT -Venous Doppler significant for left lower extremity extensive acute DVT, currently on Lovenox, will transition to Eliquis in 1 to 2 days  SpO2: 92 % O2 Flow Rate (L/min): 2 L/min FiO2 (%): 28 %  Recent Labs  Lab 05/09/20 1212 05/09/20 1621 05/10/20 0106 05/10/20 0807 05/11/20 0131 05/12/20 0300  WBC 8.3  --  6.1   --  3.9* 7.7  HGB 11.3*  --  8.5*  --  9.5* 8.9*  HCT 35.9*  --  27.5*  --  29.8* 28.0*  PLT 187  --  140*  --  193 210  CRP  --   --   --  11.2* 10.5* 5.6*  BNP  --   --   --  107.5* 122.6* 98.9  DDIMER  --   --   --  3.48* 2.75* 3.45*  PROCALCITON <0.10  --   --   --   --   --   AST 44*  --   --   --  25 23  ALT 22  --   --   --  17 13  ALKPHOS 101  --   --   --  74 63  BILITOT 1.5*  --   --   --  0.9 0.4  ALBUMIN 2.4*  --   --   --  1.9* 1.9*  INR 1.2  --   --   --   --   --   LATICACIDVEN 3.3* 1.6  --   --   --   --   SARSCOV2NAA POSITIVE*  --   --   --   --   --      Deconditioning, -Patient has had two recent admissions to the hospital . Supportive care with PT OT. At risk for delirium, minimize narcotics and benzodiazepines.   Metabolic encephalopathy. -Resolved, mentation back to baseline  Acute on Chronic diastolic CHF recent EF 55 to 60%. C  BPH. On Flomax.  Dyslipidemia. On statin continue.  History of Rheumatoid arthritis. Takes Rituxan every 4 months.  HTN  - blood pressure slightly on the lower side with tachycardia, on low-dose beta-blocker  - few months ago he had an episode of adrenal insufficiency, he was treated with stress dose steroids, will discontinue today monitor response  Hyperglycemia -pre Diabetes with A1c of 6.3       Condition - Extremely Guarded  Family Communication  : Discussed with daughter at bedside  Code Status : Full code, confirmed by patient and daughter  Consults  :  Pall.Care  PUD Prophylaxis : PPI   Procedures  :     Leg ultrasound.      Disposition Plan  :    Status is: Inpatient  Remains inpatient appropriate because:IV treatments appropriate due to intensity of illness or inability to take PO   Dispo: The patient is from: SNF              Anticipated d/c is to: SNF              Patient currently is not medically stable to d/c.   Difficult to place patient No   DVT Prophylaxis  :  Lovenox   Lab  Results  Component Value Date   PLT 210 05/12/2020    Diet :  Diet Order            Diet regular Room service appropriate? Yes; Fluid consistency: Thin  Diet effective now                  Inpatient Medications  Scheduled Meds: . aspirin  81 mg Oral Daily  . Chlorhexidine Gluconate Cloth  6 each Topical Q0600  . enoxaparin (LOVENOX) injection  90 mg Subcutaneous Q12H  . hydrocortisone sod succinate (SOLU-CORTEF) inj  50 mg Intravenous Q12H  . insulin aspart  0-15 Units Subcutaneous TID WC  . insulin aspart  0-5 Units Subcutaneous QHS  . lactose free nutrition  237 mL Oral TID WC  . melatonin  5 mg Oral QHS  . metoprolol tartrate  25 mg Oral BID  . midodrine  5 mg Oral TID WC  . mometasone-formoterol  2 puff Inhalation BID  . mupirocin ointment  1 application Nasal BID  .  pantoprazole  40 mg Oral Daily  . simvastatin  20 mg Oral Daily  . sodium chloride flush  3 mL Intravenous Q12H  . tamsulosin  0.4 mg Oral Daily  . umeclidinium bromide  1 puff Inhalation Daily   Continuous Infusions: . sodium chloride    . piperacillin-tazobactam (ZOSYN)  IV 3.375 g (05/12/20 0854)   PRN Meds:.sodium chloride, acetaminophen **OR** [DISCONTINUED] acetaminophen, albuterol, lip balm, metoprolol tartrate, [DISCONTINUED] ondansetron **OR** ondansetron (ZOFRAN) IV  Antibiotics  :    Anti-infectives (From admission, onward)   Start     Dose/Rate Route Frequency Ordered Stop   05/10/20 1700  vancomycin (VANCOREADY) IVPB 1500 mg/300 mL  Status:  Discontinued        1,500 mg 150 mL/hr over 120 Minutes Intravenous Every 24 hours 05/10/20 1613 05/12/20 1041   05/10/20 1700  piperacillin-tazobactam (ZOSYN) IVPB 3.375 g        3.375 g 12.5 mL/hr over 240 Minutes Intravenous Every 8 hours 05/10/20 1613     05/10/20 1400  ceFEPIme (MAXIPIME) 2 g in sodium chloride 0.9 % 100 mL IVPB  Status:  Discontinued        2 g 200 mL/hr over 30 Minutes Intravenous Every 8 hours 05/10/20 1002 05/10/20 1108    05/10/20 1330  vancomycin (VANCOREADY) IVPB 1500 mg/300 mL  Status:  Discontinued        1,500 mg 150 mL/hr over 120 Minutes Intravenous Every 24 hours 05/09/20 1355 05/10/20 1108   05/10/20 1215  levofloxacin (LEVAQUIN) tablet 750 mg  Status:  Discontinued        750 mg Oral Daily 05/10/20 1115 05/10/20 2003   05/10/20 0100  ceFEPIme (MAXIPIME) 2 g in sodium chloride 0.9 % 100 mL IVPB  Status:  Discontinued        2 g 200 mL/hr over 30 Minutes Intravenous Every 12 hours 05/09/20 1355 05/10/20 1002   05/09/20 1230  ceFEPIme (MAXIPIME) 2 g in sodium chloride 0.9 % 100 mL IVPB        2 g 200 mL/hr over 30 Minutes Intravenous  Once 05/09/20 1227 05/09/20 1324   05/09/20 1230  vancomycin (VANCOREADY) IVPB 2000 mg/400 mL        2,000 mg 200 mL/hr over 120 Minutes Intravenous  Once 05/09/20 1228 05/09/20 1546       Time Spent in minutes  56   Emeline Gins Jenaro Souder M.D on 05/12/2020 at 2:28 PM  To page go to [email or web address]   [facility] -  Office  [phone]    See all Orders from today for further details    Objective:   Vitals:   05/12/20 0342 05/12/20 0734 05/12/20 0818 05/12/20 1129  BP: 106/76 107/78  94/67  Pulse: 68 69  83  Resp: 18 16  17   Temp: (!) 97.4 F (36.3 C) 98.8 F (37.1 C)  (!) 97.3 F (36.3 C)  TempSrc: Oral Oral  Oral  SpO2: 97% 93% 95% 92%  Weight:      Height:        Wt Readings from Last 3 Encounters:  05/09/20 88 kg  05/05/20 88.5 kg  04/26/20 88.5 kg     Intake/Output Summary (Last 24 hours) at 05/12/2020 1428 Last data filed at 05/12/2020 0854 Gross per 24 hour  Intake 508.99 ml  Output 750 ml  Net -241.01 ml     Physical Exam  Awake Alert, Oriented X 3, No new F.N deficits, Normal affect Symmetrical Chest wall movement, Good air movement bilaterally,  bibasilar Rales RRR,No Gallops,Rubs or new Murmurs, No Parasternal Heave +ve B.Sounds, Abd Soft, No tenderness, No rebound - guarding or rigidity. No Cyanosis, Clubbing or edema, No  new Rash or bruise      Data Review:    CBC Recent Labs  Lab 05/09/20 1212 05/10/20 0106 05/11/20 0131 05/12/20 0300  WBC 8.3 6.1 3.9* 7.7  HGB 11.3* 8.5* 9.5* 8.9*  HCT 35.9* 27.5* 29.8* 28.0*  PLT 187 140* 193 210  MCV 89.5 90.2 87.9 87.8  MCH 28.2 27.9 28.0 27.9  MCHC 31.5 30.9 31.9 31.8  RDW 18.2* 18.2* 18.0* 18.0*  LYMPHSABS 0.4*  --  0.4* 0.5*  MONOABS 0.5  --  0.1 0.6  EOSABS 0.2  --  0.0 0.0  BASOSABS 0.0  --  0.0 0.0    Recent Labs  Lab 05/09/20 1212 05/09/20 1621 05/10/20 0106 05/10/20 0807 05/11/20 0131 05/12/20 0300 05/12/20 0741  NA 136  --  135  --  136 133*  --   K 4.4  --  3.6  --  3.8 3.0*  --   CL 101  --  107  --  103 102  --   CO2 22  --  21*  --  24 22  --   GLUCOSE 128*  --  143*  --  177* 292*  --   BUN 19  --  17  --  17 23  --   CREATININE 1.48*  --  0.98  --  1.23 1.22  --   CALCIUM 9.8  --  9.1  --  9.3 9.0  --   AST 44*  --   --   --  25 23  --   ALT 22  --   --   --  17 13  --   ALKPHOS 101  --   --   --  74 63  --   BILITOT 1.5*  --   --   --  0.9 0.4  --   ALBUMIN 2.4*  --   --   --  1.9* 1.9*  --   MG  --   --   --  1.4* 1.8 1.7  --   CRP  --   --   --  11.2* 10.5* 5.6*  --   DDIMER  --   --   --  3.48* 2.75* 3.45*  --   PROCALCITON <0.10  --   --   --   --   --   --   LATICACIDVEN 3.3* 1.6  --   --   --   --   --   INR 1.2  --   --   --   --   --   --   TSH  --   --   --  1.040  --   --   --   HGBA1C  --   --   --   --   --   --  6.3*  BNP  --   --   --  107.5* 122.6* 98.9  --     ------------------------------------------------------------------------------------------------------------------ No results for input(s): CHOL, HDL, LDLCALC, TRIG, CHOLHDL, LDLDIRECT in the last 72 hours.  Lab Results  Component Value Date   HGBA1C 6.3 (H) 05/12/2020   ------------------------------------------------------------------------------------------------------------------ Recent Labs    05/10/20 0807  TSH 1.040    Cardiac  Enzymes No results for input(s): CKMB, TROPONINI, MYOGLOBIN in the last 168 hours.  Invalid input(s): CK ------------------------------------------------------------------------------------------------------------------  Component Value Date/Time   BNP 98.9 05/12/2020 0300    Micro Results Recent Results (from the past 240 hour(s))  Urine culture     Status: None   Collection Time: 05/09/20  2:07 AM   Specimen: In/Out Cath Urine  Result Value Ref Range Status   Specimen Description IN/OUT CATH URINE  Final   Special Requests NONE  Final   Culture   Final    NO GROWTH Performed at [facility], [address], [place], [place] [address]    Report Status 05/11/2020 FINAL  Final  Resp Panel by RT-PCR (Flu A&B, Covid) Nasopharyngeal Swab     Status: Abnormal   Collection Time: 05/09/20 12:12 PM   Specimen: Nasopharyngeal Swab; Nasopharyngeal(NP) swabs in vial transport medium  Result Value Ref Range Status   SARS Coronavirus 2 by RT PCR POSITIVE (A) NEGATIVE Final    Comment: RESULT CALLED TO, READ BACK BY AND VERIFIED WITH: RN P [facility] [id number] AT 6578 BY CM (NOTE) SARS-CoV-2 target nucleic acids are DETECTED.  The SARS-CoV-2 RNA is generally detectable in upper respiratory specimens during the acute phase of infection. Positive results are indicative of the presence of the identified virus, but do not rule out bacterial infection or co-infection with other pathogens not detected by the test. Clinical correlation with patient history and other diagnostic information is necessary to determine patient infection status. The expected result is Negative.  Fact Sheet for Patients: EntrepreneurPulse.com.au  Fact Sheet for Healthcare Providers: IncredibleEmployment.be  This test is not yet approved or cleared by the [place] FDA and  has been authorized for detection and/or diagnosis of SARS-CoV-2 by FDA under an Emergency Use  Authorization (EUA).  This EUA will remain in effect (meaning this test can be u sed) for the duration of  the COVID-19 declaration under Section 564(b)(1) of the Act, 21 U.S.C. section 360bbb-3(b)(1), unless the authorization is terminated or revoked sooner.     Influenza A by PCR NEGATIVE NEGATIVE Final   Influenza B by PCR NEGATIVE NEGATIVE Final    Comment: (NOTE) The Xpert Xpress SARS-CoV-2/FLU/RSV plus assay is intended as an aid in the diagnosis of influenza from Nasopharyngeal swab specimens and should not be used as a sole basis for treatment. Nasal washings and aspirates are unacceptable for Xpert Xpress SARS-CoV-2/FLU/RSV testing.  Fact Sheet for Patients: EntrepreneurPulse.com.au  Fact Sheet for Healthcare Providers: IncredibleEmployment.be  This test is not yet approved or cleared by the [place] FDA and has been authorized for detection and/or diagnosis of SARS-CoV-2 by FDA under an Emergency Use Authorization (EUA). This EUA will remain in effect (meaning this test can be used) for the duration of the COVID-19 declaration under Section 564(b)(1) of the Act, 21 U.S.C. section 360bbb-3(b)(1), unless the authorization is terminated or revoked.  Performed at [facility], [place] [address] [place], [place] [address]   Blood Culture (routine x 2)     Status: None (Preliminary result)   Collection Time: 05/09/20 12:12 PM   Specimen: BLOOD  Result Value Ref Range Status   Specimen Description BLOOD SITE NOT SPECIFIED  Final   Special Requests   Final    BOTTLES DRAWN AEROBIC AND ANAEROBIC Blood Culture adequate volume   Culture   Final    NO GROWTH 3 DAYS Performed at [facility], [address] [place],  [address]    Report Status PENDING  Incomplete  Blood Culture (routine x 2)     Status:  None (Preliminary result)   Collection Time: 05/09/20 12:12 PM   Specimen: BLOOD  Result Value Ref Range Status    Specimen Description BLOOD BLOOD LEFT HAND  Final   Special Requests   Final    BOTTLES DRAWN AEROBIC AND ANAEROBIC Blood Culture adequate volume   Culture   Final    NO GROWTH 3 DAYS Performed at [facility], [address], [place], [place] [address]    Report Status PENDING  Incomplete  MRSA PCR Screening     Status: Abnormal   Collection Time: 05/09/20 12:30 PM   Specimen: Nasal Mucosa; Nasopharyngeal  Result Value Ref Range Status   MRSA by PCR POSITIVE (A) NEGATIVE Final    Comment:        The GeneXpert MRSA Assay (FDA approved for NASAL specimens only), is one component of a comprehensive MRSA colonization surveillance program. It is not intended to diagnose MRSA infection nor to guide or monitor treatment for MRSA infections. RESULT CALLED TO, READ BACK BY AND VERIFIED WITH: IDOL,K RN AT 2212 05/09/2020 MITCHELL,L Performed at [facility], [place] [address], [place], [place] [address]     Radiology Reports VAS [place] IVC/ILIAC (VENOUS ONLY)  Result Date: 05/11/2020 IVC/ILIAC STUDY Indications: DVT, recent COVID Limitations: Air/bowel gas.  Comparison Study: No prior study Performing Technologist: Maudry Mayhew MHA, RDMS, RVT, RDCS  Examination Guidelines: A complete evaluation includes B-mode imaging, spectral Doppler, color Doppler, and power Doppler as needed of all accessible portions of each vessel. Bilateral testing is considered an integral part of a complete examination. Limited examinations for reoccurring indications may be performed as noted.  IVC/Iliac Findings: +----------+------+--------+--------+    IVC    PatentThrombusComments +----------+------+--------+--------+ IVC Mid   patent                 +----------+------+--------+--------+ IVC Distalpatent                 +----------+------+--------+--------+  +-------------------+---------+-----------+---------+-----------+--------+         CIV         RT-PatentRT-ThrombusLT-PatentLT-ThrombusComments +-------------------+---------+-----------+---------+-----------+--------+ Common Iliac Prox                       patent                      +-------------------+---------+-----------+---------+-----------+--------+ Common Iliac Mid                        patent                      +-------------------+---------+-----------+---------+-----------+--------+ Common Iliac Distal patent              patent                      +-------------------+---------+-----------+---------+-----------+--------+  +-------------------------+---------+-----------+---------+-----------+--------+            EIV           RT-PatentRT-ThrombusLT-PatentLT-ThrombusComments +-------------------------+---------+-----------+---------+-----------+--------+ External Iliac Vein Prox                      patent                      +-------------------------+---------+-----------+---------+-----------+--------+ External Iliac Vein       patent  Distal                                                                    +-------------------------+---------+-----------+---------+-----------+--------+   Summary: IVC/Iliac: No evidence of thrombus in IVC and Iliac veins.  *See table(s) above for measurements and observations.  Electronically signed by Harold Barban MD on 05/11/2020 at 9:55:50 PM.    Final    DG Chest Port 1 View  Result Date: 05/10/2020 CLINICAL DATA:  Short of breath, history of COPD EXAM: PORTABLE CHEST 1 VIEW COMPARISON:  Prior chest x-ray dated yesterday FINDINGS: Stable cardiac and mediastinal contours. Marked interval increase in diffuse interstitial prominence bilaterally now with Kerley B-lines in the periphery. Findings are most consistent with worsening CHF. No large effusion or pneumothorax. No acute osseous abnormality. IMPRESSION: Increasing interstitial pulmonary edema  concerning for progressive CHF. Electronically Signed   By: Jacqulynn Cadet M.D.   On: 05/10/2020 07:54   DG Chest Port 1 View  Result Date: 05/09/2020 CLINICAL DATA:  Possible sepsis Fever and cough EXAM: PORTABLE CHEST 1 VIEW COMPARISON:  04/04/2020 FINDINGS: Heart size within normal limits. No pulmonary vascular congestion. Interval improvement in aeration of the lungs with bilateral airspace opacities still remaining. IMPRESSION: Interval improvement in aeration of the lungs with bilateral opacities still remaining consistent with resolving pneumonia. Electronically Signed   By: Miachel Roux M.D.   On: 05/09/2020 12:56   VAS [place] LOWER EXTREMITY VENOUS (DVT)  Result Date: 05/11/2020  Lower Venous DVT Study Indications: Edema, Recent Covid infection, and SOB.  Comparison Study: 03/31/20 negative bilateral lower extremity venous duplex Performing Technologist: Maudry Mayhew MHA, RDMS, RVT, RDCS  Examination Guidelines: A complete evaluation includes B-mode imaging, spectral Doppler, color Doppler, and power Doppler as needed of all accessible portions of each vessel. Bilateral testing is considered an integral part of a complete examination. Limited examinations for reoccurring indications may be performed as noted. The reflux portion of the exam is performed with the patient in reverse Trendelenburg.  +---------+---------------+---------+-----------+----------+--------------+ RIGHT    CompressibilityPhasicitySpontaneityPropertiesThrombus Aging +---------+---------------+---------+-----------+----------+--------------+ CFV      Full           Yes      Yes                                 +---------+---------------+---------+-----------+----------+--------------+ SFJ      Full                                                        +---------+---------------+---------+-----------+----------+--------------+ FV Prox  Full                                                         +---------+---------------+---------+-----------+----------+--------------+ FV Mid   Full                                                        +---------+---------------+---------+-----------+----------+--------------+  FV DistalFull                                                        +---------+---------------+---------+-----------+----------+--------------+ PFV      Full                                                        +---------+---------------+---------+-----------+----------+--------------+ POP      Full           Yes      Yes                                 +---------+---------------+---------+-----------+----------+--------------+ PTV      Full                                                        +---------+---------------+---------+-----------+----------+--------------+ PERO     Full                                                        +---------+---------------+---------+-----------+----------+--------------+ Gastroc  None                    No                   Acute          +---------+---------------+---------+-----------+----------+--------------+   +---------+---------------+---------+-----------+----------+--------------+ LEFT     CompressibilityPhasicitySpontaneityPropertiesThrombus Aging +---------+---------------+---------+-----------+----------+--------------+ CFV      Full           Yes      Yes                                 +---------+---------------+---------+-----------+----------+--------------+ SFJ      Full                                                        +---------+---------------+---------+-----------+----------+--------------+ FV Prox  None                    No                   Acute          +---------+---------------+---------+-----------+----------+--------------+ FV Mid   None                    No                   Acute           +---------+---------------+---------+-----------+----------+--------------+ FV DistalNone  No                   Acute          +---------+---------------+---------+-----------+----------+--------------+ PFV      Full                                                        +---------+---------------+---------+-----------+----------+--------------+ POP      None                    No                   Acute          +---------+---------------+---------+-----------+----------+--------------+ PTV      None                    No                   Acute          +---------+---------------+---------+-----------+----------+--------------+ PERO     None                    No                   Acute          +---------+---------------+---------+-----------+----------+--------------+     Summary: RIGHT: - Findings consistent with acute deep vein thrombosis involving the right gastrocnemius veins. - No cystic structure found in the popliteal fossa.  LEFT: - Findings consistent with acute deep vein thrombosis involving the left femoral vein, left popliteal vein, left posterior tibial veins, and left peroneal veins. - No cystic structure found in the popliteal fossa.  *See table(s) above for measurements and observations. Electronically signed by Harold Barban MD on 05/11/2020 at 9:56:21 PM.    Final

## 2020-05-12 NOTE — Progress Notes (Signed)
Physical Therapy Treatment Patient Details Name: Tyrone Roman MRN: [id number] DOB: [birth year] Today's Date: 05/12/2020    History of Present Illness Pt is 75 y.o. male presenting with tachycardia and confusion and admitted on 05/09/20 with sepsis and likely PNE.  Presents from [facility] after hospitalization in February for COVID PNA.  PMH: RA; CAD s/p stent; HTN; HLD; COPD; and BPH    PT Comments    Pt making gradual progress but was limited by orthostatic hypotension and DOE.  He was motivated and willing to make multiple attempts to ambulate with rest breaks to recover. Needing assist of 2 for safety with chair follow. Orthostatic hypotension improved with AROM exercises prior to ambulation.  Continue to advance as able.    Follow Up Recommendations  SNF     Equipment Recommendations  Other (comment) (has DME)    Recommendations for Other Services       Precautions / Restrictions Precautions Precautions: Fall;Other (comment) Precaution Comments: monitor HR, O2, BP - orthostatic Restrictions Weight Bearing Restrictions: No    Mobility  Bed Mobility Overal bed mobility: Needs Assistance Bed Mobility: Supine to Sit     Supine to sit: Min guard     General bed mobility comments: in chair at arrival    Transfers Overall transfer level: Needs assistance Equipment used: Rolling walker (2 wheeled) Transfers: Sit to/from Stand Sit to Stand: Min assist         General transfer comment: Performed x 3 with min A to rise and cues for hand placement; standing from recliner  Ambulation/Gait Ambulation/Gait assistance: Min assist;+2 safety/equipment Gait Distance (Feet): 3 Feet (3'x2) Assistive device: Rolling walker (2 wheeled) Gait Pattern/deviations: Step-to pattern;Decreased stride length Gait velocity: slowed   General Gait Details: Pt requiring close chair follow and with limited distance due to shortness of breath and lightheadedness.  Pt motivated to improve and  walk requiring cues for RW and min A to steady. Pt did have to sit quickly on both attempts due to fatigue and lightheadedness   Stairs             Wheelchair Mobility    Modified Rankin (Stroke Patients Only)       Balance Overall balance assessment: Needs assistance Sitting-balance support: Feet supported;No upper extremity supported Sitting balance-Leahy Scale: Fair     Standing balance support: Bilateral upper extremity supported Standing balance-Leahy Scale: Poor Standing balance comment: Requiring RW and min A                            Cognition Arousal/Alertness: Awake/alert Behavior During Therapy: WFL for tasks assessed/performed Overall Cognitive Status: Within Functional Limits for tasks assessed Area of Impairment: Attention;Memory;Safety/judgement;Awareness;Problem solving                   Current Attention Level: Selective Memory: Decreased short-term memory   Safety/Judgement: Decreased awareness of safety;Decreased awareness of deficits Awareness: Emergent Problem Solving: Slow processing;Decreased initiation;Difficulty sequencing;Requires verbal cues General Comments: WFL but did not test higher level cognition      Exercises General Exercises - Lower Extremity Ankle Circles/Pumps: AROM;Both;20 reps;Seated Quad Sets: AROM;Both;15 reps;Seated Short Arc Quad: AROM;Both;15 reps;Seated Long Arc Quad: AROM;20 reps;Both;Seated Straight Leg Raises: AROM;Both;10 reps;Supine Hip Flexion/Marching: AROM;Both;20 reps;Seated Other Exercises Other Exercises: Bil UE shoulder press over head and forward x 20 seated    General Comments General comments (skin integrity, edema, etc.): Pt on 2 L O2 with O2 sats >91%.  HR  90's -109 max.  BP prior 96/65, sitting forward edge of chair after donning shoes became lightheaded and bp 76/66, performed AROM exercises and up to 104/69 with symptoms improving, post first walk 100/69, post second walk  103/91; pt unable to tolerate standing long enough to get bp in standing.      Pertinent Vitals/Pain Pain Assessment: No/denies pain    Home Living                      Prior Function            PT Goals (current goals can now be found in the care plan section) Acute Rehab PT Goals Patient Stated Goal: continue to get better PT Goal Formulation: With patient Time For Goal Achievement: 05/24/20 Potential to Achieve Goals: Fair Progress towards PT goals: Progressing toward goals    Frequency    Min 2X/week      PT Plan Current plan remains appropriate    Co-evaluation              AM-PAC PT "6 Clicks" Mobility   Outcome Measure  Help needed turning from your back to your side while in a flat bed without using bedrails?: A Little Help needed moving from lying on your back to sitting on the side of a flat bed without using bedrails?: A Lot Help needed moving to and from a bed to a chair (including a wheelchair)?: A Little Help needed standing up from a chair using your arms (e.g., wheelchair or bedside chair)?: A Little Help needed to walk in hospital room?: A Lot Help needed climbing 3-5 steps with a railing? : Total 6 Click Score: 14    End of Session Equipment Utilized During Treatment: Gait belt;Oxygen Activity Tolerance: Patient limited by fatigue (and orthostatic hypotension) Patient left: in chair;with chair alarm set;with call bell/phone within reach;with nursing/sitter in room Nurse Communication: Mobility status (BP) PT Visit Diagnosis: Unsteadiness on feet (R26.81);Other abnormalities of gait and mobility (R26.89);Muscle weakness (generalized) (M62.81);Difficulty in walking, not elsewhere classified (R26.2)     Time: 4818-5631 PT Time Calculation (min) (ACUTE ONLY): 25 min  Charges:  $Gait Training: 8-22 mins $Therapeutic Activity: 8-22 mins                     Abran Richard, PT Acute Rehab Services Pager [phone] [facility]  [place] 05/12/2020, 4:05 PM

## 2020-05-12 NOTE — Progress Notes (Signed)
Occupational Therapy Treatment Patient Details Name: Tyrone Roman MRN: [id number] DOB: Dec 16, 1945 Today's Date: 05/12/2020    History of present illness 75 y.o. male presenting with tachycardia and confusion. Presents from [facility] after hospitalization in February for COVID PNA.  In ED found to be febrile, BP 90s, tachycardic and tachypnic. B lung opacities on CXR. Admitted 05/09/20 for treatment of sepsis and likely PNA. PMH: RA; CAD s/p stent; HTN; HLD; COPD; and BPH   OT comments  Patient continues to make steady progress towards goals in skilled OT session. Patient's session encompassed therapeutic exercises in supine and seated, and functional mobility to increase activity tolerance and endurance. Pt with increased affect and participation in session, and while pt notes fatigue and need for extended rest breaks, is able to complete bed mobility and there-ex at supervision/min guard level. Pt remains in low 90s for O2 but with increased WOB with transitions, however remains stable with minimal challenge. Pt willing to transfer to chair at end of session, with min a for safety, however due to increased fatigue unwilling to attempt further ambulation. Pt educated on importance of mobility, exercises, and use of incentive spirometer with verbal acknowledgement noted. Therapy will continue to follow while in house.    Follow Up Recommendations  SNF;Supervision/Assistance - 24 hour    Equipment Recommendations  Other (comment) (will continue to assess)    Recommendations for Other Services      Precautions / Restrictions Precautions Precautions: Fall;Other (comment) Precaution Comments: monitor HR, O2 Restrictions Weight Bearing Restrictions: No       Mobility Bed Mobility Overal bed mobility: Needs Assistance Bed Mobility: Supine to Sit     Supine to sit: Min guard     General bed mobility comments: increased ability to come into sitting, no dizziness noted, VSS with O2 however  noted DOE and WOB in order to complete    Transfers Overall transfer level: Needs assistance Equipment used: Rolling walker (2 wheeled) Transfers: Sit to/from Stand Sit to Stand: Min guard         General transfer comment: able to teach back appropriate sequence, bed elevated in order to complete    Balance Overall balance assessment: Needs assistance Sitting-balance support: Feet supported;Bilateral upper extremity supported;No upper extremity supported Sitting balance-Leahy Scale: Fair     Standing balance support: Bilateral upper extremity supported Standing balance-Leahy Scale: Poor Standing balance comment: reliant on RW, minimal anxiety with movement due to known fatigue, but willing to attempt transfer with therapist                           ADL either performed or assessed with clinical judgement   ADL Overall ADL's : Needs assistance/impaired     Grooming: Sitting;Brushing hair;Minimal assistance Grooming Details (indicate cue type and reason): increased ability to sit upright and participate                 Toilet Transfer: Minimal assistance;RW Toilet Transfer Details (indicate cue type and reason): simulated with stand pivot to recliner, increased participation in session, however extended rest breaks needed         Functional mobility during ADLs: Minimal assistance;Cueing for safety;Cueing for sequencing;Rolling walker General ADL Comments: stand pivot to recliner, increased ability to participate in exercises, VSS, but extended rest breaks needed     Vision       Perception     Praxis      Cognition Arousal/Alertness: Awake/alert Behavior During  Therapy: WFL for tasks assessed/performed Overall Cognitive Status: Impaired/Different from baseline Area of Impairment: Attention;Memory;Safety/judgement;Awareness;Problem solving                   Current Attention Level: Selective Memory: Decreased short-term memory    Safety/Judgement: Decreased awareness of safety;Decreased awareness of deficits Awareness: Emergent Problem Solving: Slow processing;Decreased initiation;Difficulty sequencing;Requires verbal cues General Comments: increased orientation noted, however continues to demonstrate minimal deficits in STM, is able to compensate well, therefore suggest higher level cognitve tasks in order to fully assess        Exercises General Exercises - Lower Extremity Ankle Circles/Pumps: AROM;Both;15 reps;Supine;Seated Quad Sets: AROM;Both;15 reps;Seated Short Arc Quad: AROM;Both;15 reps;Seated Straight Leg Raises: AROM;Both;10 reps;Supine Hip Flexion/Marching: AROM;Both;10 reps;Seated   Shoulder Instructions       General Comments      Pertinent Vitals/ Pain       Pain Assessment: No/denies pain  Home Living                                          Prior Functioning/Environment              Frequency  Min 2X/week        Progress Toward Goals  OT Goals(current goals can now be found in the care plan section)  Progress towards OT goals: Progressing toward goals  Acute Rehab OT Goals Patient Stated Goal: continue to get better OT Goal Formulation: With patient Time For Goal Achievement: 05/24/20 Potential to Achieve Goals: Good  Plan Discharge plan remains appropriate    Co-evaluation                 AM-PAC OT "6 Clicks" Daily Activity     Outcome Measure   Help from another person eating meals?: None Help from another person taking care of personal grooming?: A Little Help from another person toileting, which includes using toliet, bedpan, or urinal?: A Little Help from another person bathing (including washing, rinsing, drying)?: A Little Help from another person to put on and taking off regular upper body clothing?: A Little Help from another person to put on and taking off regular lower body clothing?: A Lot 6 Click Score: 18    End of  Session Equipment Utilized During Treatment: Oxygen  OT Visit Diagnosis: Unsteadiness on feet (R26.81);Other abnormalities of gait and mobility (R26.89);Muscle weakness (generalized) (M62.81)   Activity Tolerance Patient limited by fatigue;Patient limited by lethargy   Patient Left in chair;with call bell/phone within reach;with chair alarm set;with family/visitor present   Nurse Communication Mobility status        Time: 5784-6962 OT Time Calculation (min): 28 min  Charges: OT General Charges $OT Visit: 1 Visit OT Treatments $Therapeutic Activity: 8-22 mins $Therapeutic Exercise: 8-22 mins  [place]. Makala Fetterolf, COTA/L Acute Rehabilitation Services [phone] [place] 05/12/2020, 3:06 PM

## 2020-05-13 ENCOUNTER — Inpatient Hospital Stay (HOSPITAL_COMMUNITY): Payer: Medicare Other

## 2020-05-13 DIAGNOSIS — R6510 Systemic inflammatory response syndrome (SIRS) of non-infectious origin without acute organ dysfunction: Secondary | ICD-10-CM | POA: Diagnosis not present

## 2020-05-13 DIAGNOSIS — J189 Pneumonia, unspecified organism: Secondary | ICD-10-CM | POA: Diagnosis not present

## 2020-05-13 DIAGNOSIS — A419 Sepsis, unspecified organism: Secondary | ICD-10-CM | POA: Diagnosis not present

## 2020-05-13 LAB — COMPREHENSIVE METABOLIC PANEL
ALT: 20 U/L (ref 0–44)
AST: 36 U/L (ref 15–41)
Albumin: 2.1 g/dL — ABNORMAL LOW (ref 3.5–5.0)
Alkaline Phosphatase: 68 U/L (ref 38–126)
Anion gap: 11 (ref 5–15)
BUN: 23 mg/dL (ref 8–23)
CO2: 24 mmol/L (ref 22–32)
Calcium: 9.4 mg/dL (ref 8.9–10.3)
Chloride: 104 mmol/L (ref 98–111)
Creatinine, Ser: 1.29 mg/dL — ABNORMAL HIGH (ref 0.61–1.24)
GFR, Estimated: 58 mL/min — ABNORMAL LOW (ref >60)
Glucose, Bld: 111 mg/dL — ABNORMAL HIGH (ref 70–99)
Potassium: 3 mmol/L — ABNORMAL LOW (ref 3.5–5.1)
Sodium: 139 mmol/L (ref 135–145)
Total Bilirubin: 0.7 mg/dL (ref 0.3–1.2)
Total Protein: 4.8 g/dL — ABNORMAL LOW (ref 6.5–8.1)

## 2020-05-13 LAB — URINALYSIS, ROUTINE W REFLEX MICROSCOPIC
Bilirubin Urine: NEGATIVE
Glucose, UA: NEGATIVE mg/dL
Hgb urine dipstick: NEGATIVE
Ketones, ur: NEGATIVE mg/dL
Leukocytes,Ua: NEGATIVE
Nitrite: NEGATIVE
Protein, ur: NEGATIVE mg/dL
Specific Gravity, Urine: 1.025 (ref 1.005–1.030)
pH: 5 (ref 5.0–8.0)

## 2020-05-13 LAB — GLUCOSE, CAPILLARY
Glucose-Capillary: 85 mg/dL (ref 70–99)
Glucose-Capillary: 234 mg/dL — ABNORMAL HIGH (ref 70–99)
Glucose-Capillary: 87 mg/dL (ref 70–99)
Glucose-Capillary: 122 mg/dL — ABNORMAL HIGH (ref 70–99)
Glucose-Capillary: 109 mg/dL — ABNORMAL HIGH (ref 70–99)

## 2020-05-13 LAB — CBC WITH DIFFERENTIAL/PLATELET
Abs Immature Granulocytes: 0.38 10*3/uL — ABNORMAL HIGH (ref 0.00–0.07)
Basophils Absolute: 0 10*3/uL (ref 0.0–0.1)
Basophils Relative: 0 %
Eosinophils Absolute: 0.2 10*3/uL (ref 0.0–0.5)
Eosinophils Relative: 2 %
HCT: 29.8 % — ABNORMAL LOW (ref 39.0–52.0)
Hemoglobin: 9.5 g/dL — ABNORMAL LOW (ref 13.0–17.0)
Immature Granulocytes: 5 %
Lymphocytes Relative: 7 %
Lymphs Abs: 0.6 10*3/uL — ABNORMAL LOW (ref 0.7–4.0)
MCH: 28 pg (ref 26.0–34.0)
MCHC: 31.9 g/dL (ref 30.0–36.0)
MCV: 87.9 fL (ref 80.0–100.0)
Monocytes Absolute: 0.5 10*3/uL (ref 0.1–1.0)
Monocytes Relative: 6 %
Neutro Abs: 6.5 10*3/uL (ref 1.7–7.7)
Neutrophils Relative %: 80 %
Platelets: 248 10*3/uL (ref 150–400)
RBC: 3.39 MIL/uL — ABNORMAL LOW (ref 4.22–5.81)
RDW: 18.3 % — ABNORMAL HIGH (ref 11.5–15.5)
WBC: 8.1 10*3/uL (ref 4.0–10.5)
nRBC: 0.2 % (ref 0.0–0.2)

## 2020-05-13 LAB — D-DIMER, QUANTITATIVE: D-Dimer, Quant: 3.76 ug/mL-FEU — ABNORMAL HIGH (ref 0.00–0.50)

## 2020-05-13 LAB — PROCALCITONIN: Procalcitonin: 0.11 ng/mL

## 2020-05-13 LAB — BRAIN NATRIURETIC PEPTIDE: B Natriuretic Peptide: 72.6 pg/mL (ref 0.0–100.0)

## 2020-05-13 LAB — MAGNESIUM: Magnesium: 1.6 mg/dL — ABNORMAL LOW (ref 1.7–2.4)

## 2020-05-13 LAB — C-REACTIVE PROTEIN: CRP: 3.2 mg/dL — ABNORMAL HIGH (ref <1.0)

## 2020-05-13 MED ORDER — POTASSIUM CHLORIDE CRYS ER 20 MEQ PO TBCR
40.0000 meq | EXTENDED_RELEASE_TABLET | ORAL | Status: AC
  Administered 2020-05-13 (×2): 40 meq via ORAL
  Filled 2020-05-13 (×2): qty 2

## 2020-05-13 MED ORDER — GADOBUTROL 1 MMOL/ML IV SOLN
8.0000 mL | Freq: Once | INTRAVENOUS | Status: AC | PRN
  Administered 2020-05-13: 8 mL via INTRAVENOUS

## 2020-05-13 MED ORDER — ALBUMIN HUMAN 25 % IV SOLN
25.0000 g | Freq: Four times a day (QID) | INTRAVENOUS | Status: AC
  Administered 2020-05-13 (×3): 25 g via INTRAVENOUS
  Filled 2020-05-13 (×3): qty 100

## 2020-05-13 MED ORDER — MAGNESIUM SULFATE 2 GM/50ML IV SOLN
2.0000 g | Freq: Once | INTRAVENOUS | Status: AC
  Administered 2020-05-13: 2 g via INTRAVENOUS
  Filled 2020-05-13: qty 50

## 2020-05-13 MED ORDER — METOPROLOL TARTRATE 5 MG/5ML IV SOLN
5.0000 mg | Freq: Once | INTRAVENOUS | Status: AC
  Administered 2020-05-14: 5 mg via INTRAVENOUS
  Filled 2020-05-13: qty 5

## 2020-05-13 MED ORDER — RISAQUAD PO CAPS
1.0000 | ORAL_CAPSULE | Freq: Three times a day (TID) | ORAL | Status: DC
  Administered 2020-05-13 – 2020-05-14 (×2): 1 via ORAL
  Filled 2020-05-13 (×3): qty 1

## 2020-05-13 MED ORDER — MIDODRINE HCL 5 MG PO TABS
5.0000 mg | ORAL_TABLET | Freq: Three times a day (TID) | ORAL | Status: DC
  Administered 2020-05-13 – 2020-05-14 (×5): 5 mg via ORAL
  Filled 2020-05-13 (×6): qty 1

## 2020-05-13 MED ORDER — SODIUM CHLORIDE 0.9 % IV BOLUS
250.0000 mL | Freq: Once | INTRAVENOUS | Status: AC
  Administered 2020-05-13: 250 mL via INTRAVENOUS

## 2020-05-13 NOTE — Progress Notes (Signed)
   05/13/20 0756  Assess: MEWS Score  Temp (!) 100.7 F (38.2 C)  BP 129/89  Pulse Rate (!) 119  ECG Heart Rate (!) 122  Resp 19  Level of Consciousness Alert  SpO2 94 %  O2 Device Nasal Cannula  Patient Activity (if Appropriate) In bed  O2 Flow Rate (L/min) 2 L/min  Assess: MEWS Score  MEWS Temp 1  MEWS Systolic 0  MEWS Pulse 2  MEWS RR 0  MEWS LOC 0  MEWS Score 3  MEWS Score Color Yellow  Assess: if the MEWS score is Yellow or Red  Were vital signs taken at a resting state? Yes  Focused Assessment No change from prior assessment  Early Detection of Sepsis Score *See Row Information* High  MEWS guidelines implemented *See Row Information* No, vital signs rechecked  Treat  MEWS Interventions Administered scheduled meds/treatments;Escalated (See documentation below)  Pain Scale 0-10  Pain Score 0  Escalate  MEWS: Escalate Yellow: discuss with charge nurse/RN and consider discussing with provider and RRT  Notify: Charge Nurse/RN  Name of Charge Nurse/RN Notified Elisa  Date Charge Nurse/RN Notified 05/13/20  Time Charge Nurse/RN Notified 0900  Notify: Provider  Provider Name/Title Elgergawy/Attending  Date Provider Notified 05/13/20  Time Provider Notified [phone]  Notification Type Page (SecureChat)  Notification Reason Change in status (Tachy/Mews)  Provider response See new orders  Date of Provider Response 05/13/20  Time of Provider Response [phone]  Document  Patient Outcome Stabilized after interventions  Progress note created (see row info) Yes

## 2020-05-13 NOTE — Progress Notes (Signed)
Notified by RN that pt spiked fever and shivering with chills. Has tachycardia, tachypnea and elevated BP.  Tylenol given.  Pt is on zosyn. Is on therapeutic lovenox for DVT. Pt was on Vancomycin and levaquin with zosyn initially when admitted.  Reviewed chart. Pt with fever this afternoon and blood cultures drawn at that time.  Evaluated pt at bedside.   CV-ST, no murmur Lungs-tachypnea, decreased breath sounds in bases. No rhonchi.  Abdomen-Soft, Nondistended. No masses.  Skin-no rash. No purpura or petechiae  Check lactic acid level Check CT angiography to further evaluate lung fields and make sure there is no PE.  Patient did have DVT and severe elevated D-dimer at admission and has been on Lovenox since then want to make sure he has not developed a PE despite anticoagulation or has worsening infiltrate or consolidation Patient given dose of metoprolol 5 mg IV for tachycardia and blood pressure. Continue to monitor closely

## 2020-05-13 NOTE — Progress Notes (Signed)
PROGRESS NOTE                                                                                                                                                                                                             Patient Demographics:    Tyrone Roman, is a 75 y.o. male, DOB - Aug 28, 1945, UDJ:[id number]  Outpatient Primary MD for the patient is [name], MD    LOS - 4  Admit date - 05/09/2020    Chief Complaint  Patient presents with  . Tachycardia       Brief Narrative (HPI from H&P)    - Tyrone Roman is a 75 y.o. male with medical history significant of RA; CAD s/p stent; HTN; HLD; COPD; and BPH presenting with tachycardia.  He can't remember what happened, work-up here suggested hypoxia CHF/pneumonia of care, admitted for further work-up, respiratory status improved after initial diuresis, patient kept having episodes of SIRS, fever, tachycardia and hypotension, as well having elevated D-dimers, venous Dopplers was significant for left lower extremity DVT.   Subjective:    Tyrone Roman today denies febrile this morning at 100.7, tachycardic, he himself denies any complaints.     Assessment  & Plan :     Acute Hypoxic Resp. Failure due to Acute on Chronic dCHF EF 60%  -Findings consistent with acute on chronic diastolic CHF low protein levels causing intravascular dehydration and third spacing. -Improved with IV diuresis, he does appear to be euvolemic today, BNP has normalized as well, so no further diuresis  SIRS - patient continues to have intermittent fever, hypotensive episode and he is immunosuppressed due to his medication from rheumatoid arthritis. -So far septic work-up is nonrevealing, he spiked another fever today, so UA and blood cultures has been repeated . -Continue with Zosyn . -Patient with tenderness in lumbar area, will obtain lumbar spine MRI to rule out infection . -Patient will stress  dose IV hydrocortisone, which has been stopped, he remains with some episodes of soft blood pressure and orthostatics, he received IV albumin today, and will perform stim test 48 hours after stopping his hydrocortisone.  Acute DVT -Venous Doppler significant for left lower extremity extensive acute DVT, currently on Lovenox, will transition to Eliquis in 1 to 2 days  SpO2: 98 % O2 Flow Rate (L/min): 2 L/min FiO2 (%): 28 %  Recent Labs  Lab 05/09/20 1212 05/09/20 1621 05/10/20 0106 05/10/20 0807 05/11/20 0131 05/12/20 0300 05/13/20 0135 05/13/20 0900  WBC 8.3  --  6.1  --  3.9* 7.7 8.1  --   HGB 11.3*  --  8.5*  --  9.5* 8.9* 9.5*  --   HCT 35.9*  --  27.5*  --  29.8* 28.0* 29.8*  --   PLT 187  --  140*  --  193 210 248  --   CRP  --   --   --  11.2* 10.5* 5.6* 3.2*  --   BNP  --   --   --  107.5* 122.6* 98.9 72.6  --   DDIMER  --   --   --  3.48* 2.75* 3.45* 3.76*  --   PROCALCITON <0.10  --   --   --   --   --   --  0.11  AST 44*  --   --   --  25 23 36  --   ALT 22  --   --   --  17 13 20   --   ALKPHOS 101  --   --   --  74 63 68  --   BILITOT 1.5*  --   --   --  0.9 0.4 0.7  --   ALBUMIN 2.4*  --   --   --  1.9* 1.9* 2.1*  --   INR 1.2  --   --   --   --   --   --   --   LATICACIDVEN 3.3* 1.6  --   --   --   --   --   --   SARSCOV2NAA POSITIVE*  --   --   --   --   --   --   --      Deconditioning, -Patient has had two recent admissions to the hospital . Supportive care with PT OT. At risk for delirium, minimize narcotics and benzodiazepines.   Metabolic encephalopathy. -Resolved, mentation back to baseline  Acute on Chronic diastolic CHF recent EF 55 to 60%. C  BPH. On Flomax.  Dyslipidemia. On statin continue.  History of Rheumatoid arthritis. Takes Rituxan every 4 months.  HTN  - blood pressure slightly on the lower side with tachycardia, on low-dose beta-blocker  - few months ago he had an episode of adrenal insufficiency, he was treated with stress  dose steroids, will discontinue today monitor response  Hyperglycemia -pre Diabetes with A1c of 6.3       Condition - Extremely Guarded  Family Communication  : Discussed with daughter by phone.  Code Status : Full code, confirmed by patient and daughter  Consults  :  Pall.Care  PUD Prophylaxis : PPI   Procedures  :     Leg ultrasound.      Disposition Plan  :    Status is: Inpatient  Remains inpatient appropriate because:IV treatments appropriate due to intensity of illness or inability to take PO   Dispo: The patient is from: SNF              Anticipated d/c is to: SNF              Patient currently is not medically stable to d/c.   Difficult to place patient No   DVT Prophylaxis  :  Lovenox   Lab Results  Component Value Date   PLT 248 05/13/2020  Diet :  Diet Order            Diet Carb Modified Fluid consistency: Thin; Room service appropriate? Yes  Diet effective now                  Inpatient Medications  Scheduled Meds: . aspirin  81 mg Oral Daily  . Chlorhexidine Gluconate Cloth  6 each Topical Q0600  . enoxaparin (LOVENOX) injection  90 mg Subcutaneous Q12H  . insulin aspart  0-15 Units Subcutaneous TID WC  . insulin aspart  0-5 Units Subcutaneous QHS  . lactose free nutrition  237 mL Oral TID WC  . melatonin  5 mg Oral QHS  . metoprolol tartrate  25 mg Oral BID  . midodrine  5 mg Oral TID WC  . mometasone-formoterol  2 puff Inhalation BID  . mupirocin ointment  1 application Nasal BID  . pantoprazole  40 mg Oral Daily  . simvastatin  20 mg Oral Daily  . sodium chloride flush  3 mL Intravenous Q12H  . tamsulosin  0.4 mg Oral Daily  . umeclidinium bromide  1 puff Inhalation Daily   Continuous Infusions: . sodium chloride    . albumin human 25 g (05/13/20 1146)  . piperacillin-tazobactam (ZOSYN)  IV 3.375 g (05/13/20 0810)   PRN Meds:.sodium chloride, acetaminophen **OR** [DISCONTINUED] acetaminophen, albuterol, lip balm,  metoprolol tartrate, [DISCONTINUED] ondansetron **OR** ondansetron (ZOFRAN) IV  Antibiotics  :    Anti-infectives (From admission, onward)   Start     Dose/Rate Route Frequency Ordered Stop   05/10/20 1700  vancomycin (VANCOREADY) IVPB 1500 mg/300 mL  Status:  Discontinued        1,500 mg 150 mL/hr over 120 Minutes Intravenous Every 24 hours 05/10/20 1613 05/12/20 1041   05/10/20 1700  piperacillin-tazobactam (ZOSYN) IVPB 3.375 g        3.375 g 12.5 mL/hr over 240 Minutes Intravenous Every 8 hours 05/10/20 1613     05/10/20 1400  ceFEPIme (MAXIPIME) 2 g in sodium chloride 0.9 % 100 mL IVPB  Status:  Discontinued        2 g 200 mL/hr over 30 Minutes Intravenous Every 8 hours 05/10/20 1002 05/10/20 1108   05/10/20 1330  vancomycin (VANCOREADY) IVPB 1500 mg/300 mL  Status:  Discontinued        1,500 mg 150 mL/hr over 120 Minutes Intravenous Every 24 hours 05/09/20 1355 05/10/20 1108   05/10/20 1215  levofloxacin (LEVAQUIN) tablet 750 mg  Status:  Discontinued        750 mg Oral Daily 05/10/20 1115 05/10/20 2003   05/10/20 0100  ceFEPIme (MAXIPIME) 2 g in sodium chloride 0.9 % 100 mL IVPB  Status:  Discontinued        2 g 200 mL/hr over 30 Minutes Intravenous Every 12 hours 05/09/20 1355 05/10/20 1002   05/09/20 1230  ceFEPIme (MAXIPIME) 2 g in sodium chloride 0.9 % 100 mL IVPB        2 g 200 mL/hr over 30 Minutes Intravenous  Once 05/09/20 1227 05/09/20 1324   05/09/20 1230  vancomycin (VANCOREADY) IVPB 2000 mg/400 mL        2,000 mg 200 mL/hr over 120 Minutes Intravenous  Once 05/09/20 1228 05/09/20 1546       Time Spent in minutes  30   Corderro Koloski M.D on 05/13/2020 at 1:32 PM  To page go to [email or web address]   [facility] -  Office  [phone]    See all Orders  from today for further details    Objective:   Vitals:   05/13/20 0856 05/13/20 0908 05/13/20 0959 05/13/20 1156  BP:  94/66 (!) 87/63 97/75  Pulse: 94 88 89 99  Resp: 19 20 19 20   Temp:  98 F (36.7  C)  98.3 F (36.8 C)  TempSrc:  Oral  Oral  SpO2: 97% 100% 98% 98%  Weight:      Height:        Wt Readings from Last 3 Encounters:  05/09/20 88 kg  05/05/20 88.5 kg  04/26/20 88.5 kg     Intake/Output Summary (Last 24 hours) at 05/13/2020 1332 Last data filed at 05/13/2020 0810 Gross per 24 hour  Intake 168.44 ml  Output 600 ml  Net -431.56 ml     Physical Exam  Awake Alert, Oriented X2, easily distracted and more confused today, no new F.N deficits, Normal affect Symmetrical Chest wall movement, Good air movement bilaterally, CTAB RRR,No Gallops,Rubs or new Murmurs, No Parasternal Heave +ve B.Sounds, Abd Soft, lower back Tenderness at L spine area, No rebound - guarding or rigidity. No Cyanosis, Clubbing or edema, No new Rash or bruise       Data Review:    CBC Recent Labs  Lab 05/09/20 1212 05/10/20 0106 05/11/20 0131 05/12/20 0300 05/13/20 0135  WBC 8.3 6.1 3.9* 7.7 8.1  HGB 11.3* 8.5* 9.5* 8.9* 9.5*  HCT 35.9* 27.5* 29.8* 28.0* 29.8*  PLT 187 140* 193 210 248  MCV 89.5 90.2 87.9 87.8 87.9  MCH 28.2 27.9 28.0 27.9 28.0  MCHC 31.5 30.9 31.9 31.8 31.9  RDW 18.2* 18.2* 18.0* 18.0* 18.3*  LYMPHSABS 0.4*  --  0.4* 0.5* 0.6*  MONOABS 0.5  --  0.1 0.6 0.5  EOSABS 0.2  --  0.0 0.0 0.2  BASOSABS 0.0  --  0.0 0.0 0.0    Recent Labs  Lab 05/09/20 1212 05/09/20 1621 05/10/20 0106 05/10/20 0807 05/11/20 0131 05/12/20 0300 05/12/20 0741 05/13/20 0135 05/13/20 0900  NA 136  --  135  --  136 133*  --  139  --   K 4.4  --  3.6  --  3.8 3.0*  --  3.0*  --   CL 101  --  107  --  103 102  --  104  --   CO2 22  --  21*  --  24 22  --  24  --   GLUCOSE 128*  --  143*  --  177* 292*  --  111*  --   BUN 19  --  17  --  17 23  --  23  --   CREATININE 1.48*  --  0.98  --  1.23 1.22  --  1.29*  --   CALCIUM 9.8  --  9.1  --  9.3 9.0  --  9.4  --   AST 44*  --   --   --  25 23  --  36  --   ALT 22  --   --   --  17 13  --  20  --   ALKPHOS 101  --   --   --  74 63   --  68  --   BILITOT 1.5*  --   --   --  0.9 0.4  --  0.7  --   ALBUMIN 2.4*  --   --   --  1.9* 1.9*  --  2.1*  --  MG  --   --   --  1.4* 1.8 1.7  --  1.6*  --   CRP  --   --   --  11.2* 10.5* 5.6*  --  3.2*  --   DDIMER  --   --   --  3.48* 2.75* 3.45*  --  3.76*  --   PROCALCITON <0.10  --   --   --   --   --   --   --  0.11  LATICACIDVEN 3.3* 1.6  --   --   --   --   --   --   --   INR 1.2  --   --   --   --   --   --   --   --   TSH  --   --   --  1.040  --   --   --   --   --   HGBA1C  --   --   --   --   --   --  6.3*  --   --   BNP  --   --   --  107.5* 122.6* 98.9  --  72.6  --     ------------------------------------------------------------------------------------------------------------------ No results for input(s): CHOL, HDL, LDLCALC, TRIG, CHOLHDL, LDLDIRECT in the last 72 hours.  Lab Results  Component Value Date   HGBA1C 6.3 (H) 05/12/2020   ------------------------------------------------------------------------------------------------------------------ No results for input(s): TSH, T4TOTAL, T3FREE, THYROIDAB in the last 72 hours.  Invalid input(s): FREET3  Cardiac Enzymes No results for input(s): CKMB, TROPONINI, MYOGLOBIN in the last 168 hours.  Invalid input(s): CK ------------------------------------------------------------------------------------------------------------------    Component Value Date/Time   BNP 72.6 05/13/2020 0135    Micro Results Recent Results (from the past 240 hour(s))  Urine culture     Status: None   Collection Time: 05/09/20  2:07 AM   Specimen: In/Out Cath Urine  Result Value Ref Range Status   Specimen Description IN/OUT CATH URINE  Final   Special Requests NONE  Final   Culture   Final    NO GROWTH Performed at [facility], [address] [place], [place] [address]    Report Status 05/11/2020 FINAL  Final  Resp Panel by RT-PCR (Flu A&B, Covid) Nasopharyngeal Swab     Status: Abnormal   Collection Time:  05/09/20 12:12 PM   Specimen: Nasopharyngeal Swab; Nasopharyngeal(NP) swabs in vial transport medium  Result Value Ref Range Status   SARS Coronavirus 2 by RT PCR POSITIVE (A) NEGATIVE Final    Comment: RESULT CALLED TO, READ BACK BY AND VERIFIED WITH: RN P [facility] [id number] AT 1779 BY CM (NOTE) SARS-CoV-2 target nucleic acids are DETECTED.  The SARS-CoV-2 RNA is generally detectable in upper respiratory specimens during the acute phase of infection. Positive results are indicative of the presence of the identified virus, but do not rule out bacterial infection or co-infection with other pathogens not detected by the test. Clinical correlation with patient history and other diagnostic information is necessary to determine patient infection status. The expected result is Negative.  Fact Sheet for Patients: EntrepreneurPulse.com.au  Fact Sheet for Healthcare Providers: IncredibleEmployment.be  This test is not yet approved or cleared by the [place] FDA and  has been authorized for detection and/or diagnosis of SARS-CoV-2 by FDA under an Emergency Use Authorization (EUA).  This EUA will remain in effect (meaning this test can be u sed) for the duration of  the COVID-19 declaration under Section 564(b)(1) of the Act, 21 U.S.C. section 360bbb-3(b)(1), unless the authorization is terminated or revoked sooner.     Influenza A by PCR NEGATIVE NEGATIVE Final   Influenza B by PCR NEGATIVE NEGATIVE Final    Comment: (NOTE) The Xpert Xpress SARS-CoV-2/FLU/RSV plus assay is intended as an aid in the diagnosis of influenza from Nasopharyngeal swab specimens and should not be used as a sole basis for treatment. Nasal washings and aspirates are unacceptable for Xpert Xpress SARS-CoV-2/FLU/RSV testing.  Fact Sheet for Patients: EntrepreneurPulse.com.au  Fact Sheet for Healthcare  Providers: IncredibleEmployment.be  This test is not yet approved or cleared by the [place] FDA and has been authorized for detection and/or diagnosis of SARS-CoV-2 by FDA under an Emergency Use Authorization (EUA). This EUA will remain in effect (meaning this test can be used) for the duration of the COVID-19 declaration under Section 564(b)(1) of the Act, 21 U.S.C. section 360bbb-3(b)(1), unless the authorization is terminated or revoked.  Performed at [facility], [place] [address] [place], [place] [address]   Blood Culture (routine x 2)     Status: None (Preliminary result)   Collection Time: 05/09/20 12:12 PM   Specimen: BLOOD  Result Value Ref Range Status   Specimen Description BLOOD SITE NOT SPECIFIED  Final   Special Requests   Final    BOTTLES DRAWN AEROBIC AND ANAEROBIC Blood Culture adequate volume   Culture   Final    NO GROWTH 4 DAYS Performed at [facility], [place] [address] [place], [place] [address]    Report Status PENDING  Incomplete  Blood Culture (routine x 2)     Status: None (Preliminary result)   Collection Time: 05/09/20 12:12 PM   Specimen: BLOOD  Result Value Ref Range Status   Specimen Description BLOOD BLOOD LEFT HAND  Final   Special Requests   Final    BOTTLES DRAWN AEROBIC AND ANAEROBIC Blood Culture adequate volume   Culture   Final    NO GROWTH 4 DAYS Performed at [facility], [place] [address], [place], [place] [address]    Report Status PENDING  Incomplete  MRSA PCR Screening     Status: Abnormal   Collection Time: 05/09/20 12:30 PM   Specimen: Nasal Mucosa; Nasopharyngeal  Result Value Ref Range Status   MRSA by PCR POSITIVE (A) NEGATIVE Final    Comment:        The GeneXpert MRSA Assay (FDA approved for NASAL specimens only), is one component of a comprehensive MRSA colonization surveillance program. It is not intended to diagnose MRSA infection nor to guide or monitor treatment for MRSA  infections. RESULT CALLED TO, READ BACK BY AND VERIFIED WITH: IDOL,K RN AT 2212 05/09/2020 MITCHELL,L Performed at [facility], [place] [address] [place],  [address]     Radiology Reports VAS [place] IVC/ILIAC (VENOUS ONLY)  Result Date: 05/11/2020 IVC/ILIAC STUDY Indications: DVT, recent COVID Limitations: Air/bowel gas.  Comparison Study: No prior study Performing Technologist: Maudry Mayhew MHA, RDMS, RVT, RDCS  Examination Guidelines: A complete evaluation includes B-mode imaging, spectral Doppler, color Doppler, and power Doppler as needed of all accessible portions of each vessel. Bilateral testing is considered an integral part of a complete examination. Limited examinations for reoccurring indications may be performed as noted.  IVC/Iliac Findings: +----------+------+--------+--------+    IVC    PatentThrombusComments +----------+------+--------+--------+ IVC Mid   patent                 +----------+------+--------+--------+  IVC Distalpatent                 +----------+------+--------+--------+  +-------------------+---------+-----------+---------+-----------+--------+         CIV        RT-PatentRT-ThrombusLT-PatentLT-ThrombusComments +-------------------+---------+-----------+---------+-----------+--------+ Common Iliac Prox                       patent                      +-------------------+---------+-----------+---------+-----------+--------+ Common Iliac Mid                        patent                      +-------------------+---------+-----------+---------+-----------+--------+ Common Iliac Distal patent              patent                      +-------------------+---------+-----------+---------+-----------+--------+  +-------------------------+---------+-----------+---------+-----------+--------+            EIV           RT-PatentRT-ThrombusLT-PatentLT-ThrombusComments  +-------------------------+---------+-----------+---------+-----------+--------+ External Iliac Vein Prox                      patent                      +-------------------------+---------+-----------+---------+-----------+--------+ External Iliac Vein       patent                                          Distal                                                                    +-------------------------+---------+-----------+---------+-----------+--------+   Summary: IVC/Iliac: No evidence of thrombus in IVC and Iliac veins.  *See table(s) above for measurements and observations.  Electronically signed by Harold Barban MD on 05/11/2020 at 9:55:50 PM.    Final    DG Chest Port 1 View  Result Date: 05/10/2020 CLINICAL DATA:  Short of breath, history of COPD EXAM: PORTABLE CHEST 1 VIEW COMPARISON:  Prior chest x-ray dated yesterday FINDINGS: Stable cardiac and mediastinal contours. Marked interval increase in diffuse interstitial prominence bilaterally now with Kerley B-lines in the periphery. Findings are most consistent with worsening CHF. No large effusion or pneumothorax. No acute osseous abnormality. IMPRESSION: Increasing interstitial pulmonary edema concerning for progressive CHF. Electronically Signed   By: Jacqulynn Cadet M.D.   On: 05/10/2020 07:54   DG Chest Port 1 View  Result Date: 05/09/2020 CLINICAL DATA:  Possible sepsis Fever and cough EXAM: PORTABLE CHEST 1 VIEW COMPARISON:  04/04/2020 FINDINGS: Heart size within normal limits. No pulmonary vascular congestion. Interval improvement in aeration of the lungs with bilateral airspace opacities still remaining. IMPRESSION: Interval improvement in aeration of the lungs with bilateral opacities still remaining consistent with resolving pneumonia. Electronically Signed   By: Miachel Roux M.D.   On: 05/09/2020 12:56   VAS [place] LOWER EXTREMITY VENOUS (DVT)  Result Date: 05/11/2020  Lower Venous DVT Study Indications:  Edema,  Recent Covid infection, and SOB.  Comparison Study: 03/31/20 negative bilateral lower extremity venous duplex Performing Technologist: Maudry Mayhew MHA, RDMS, RVT, RDCS  Examination Guidelines: A complete evaluation includes B-mode imaging, spectral Doppler, color Doppler, and power Doppler as needed of all accessible portions of each vessel. Bilateral testing is considered an integral part of a complete examination. Limited examinations for reoccurring indications may be performed as noted. The reflux portion of the exam is performed with the patient in reverse Trendelenburg.  +---------+---------------+---------+-----------+----------+--------------+ RIGHT    CompressibilityPhasicitySpontaneityPropertiesThrombus Aging +---------+---------------+---------+-----------+----------+--------------+ CFV      Full           Yes      Yes                                 +---------+---------------+---------+-----------+----------+--------------+ SFJ      Full                                                        +---------+---------------+---------+-----------+----------+--------------+ FV Prox  Full                                                        +---------+---------------+---------+-----------+----------+--------------+ FV Mid   Full                                                        +---------+---------------+---------+-----------+----------+--------------+ FV DistalFull                                                        +---------+---------------+---------+-----------+----------+--------------+ PFV      Full                                                        +---------+---------------+---------+-----------+----------+--------------+ POP      Full           Yes      Yes                                 +---------+---------------+---------+-----------+----------+--------------+ PTV      Full                                                         +---------+---------------+---------+-----------+----------+--------------+ PERO     Full                                                        +---------+---------------+---------+-----------+----------+--------------+  Gastroc  None                    No                   Acute          +---------+---------------+---------+-----------+----------+--------------+   +---------+---------------+---------+-----------+----------+--------------+ LEFT     CompressibilityPhasicitySpontaneityPropertiesThrombus Aging +---------+---------------+---------+-----------+----------+--------------+ CFV      Full           Yes      Yes                                 +---------+---------------+---------+-----------+----------+--------------+ SFJ      Full                                                        +---------+---------------+---------+-----------+----------+--------------+ FV Prox  None                    No                   Acute          +---------+---------------+---------+-----------+----------+--------------+ FV Mid   None                    No                   Acute          +---------+---------------+---------+-----------+----------+--------------+ FV DistalNone                    No                   Acute          +---------+---------------+---------+-----------+----------+--------------+ PFV      Full                                                        +---------+---------------+---------+-----------+----------+--------------+ POP      None                    No                   Acute          +---------+---------------+---------+-----------+----------+--------------+ PTV      None                    No                   Acute          +---------+---------------+---------+-----------+----------+--------------+ PERO     None                    No                   Acute           +---------+---------------+---------+-----------+----------+--------------+     Summary: RIGHT: - Findings consistent with acute deep vein thrombosis involving the right gastrocnemius veins. - No cystic structure found in the popliteal fossa.  LEFT: - Findings consistent with acute deep vein thrombosis involving the left femoral vein, left popliteal vein, left posterior tibial veins, and left peroneal veins. - No cystic structure found in the popliteal fossa.  *See table(s) above for measurements and observations. Electronically signed by Harold Barban MD on 05/11/2020 at 9:56:21 PM.    Final

## 2020-05-13 NOTE — Care Management Important Message (Signed)
Important Message  Patient Details  Name: Tyrone Roman MRN: [id number] Date of Birth: July 15, 1945   Medicare Important Message Given:  Yes - Important Message mailed due to current National Emergency   Verbal consent obtained due to current National Emergency  Relationship to patient: Child Contact Name: [name] Call Date: 05/13/20  Time: 1103 Phone: [phone] Outcome: Spoke with contact Important Message mailed to: Patient address on file     [place] 05/13/2020, 11:04 AM

## 2020-05-13 NOTE — Progress Notes (Signed)
   05/13/20 1629  Assess: MEWS Score  Temp 99.8 F (37.7 C)  BP 121/88  Pulse Rate (!) 112  ECG Heart Rate (!) 112  Resp (!) 21  Level of Consciousness Alert  SpO2 96 %  O2 Device Nasal Cannula  Patient Activity (if Appropriate) In bed  O2 Flow Rate (L/min) 2 L/min  Assess: MEWS Score  MEWS Temp 0  MEWS Systolic 0  MEWS Pulse 2  MEWS RR 1  MEWS LOC 0  MEWS Score 3  MEWS Score Color Yellow  Assess: if the MEWS score is Yellow or Red  Were vital signs taken at a resting state? Yes  Focused Assessment No change from prior assessment  Early Detection of Sepsis Score *See Row Information* High  MEWS guidelines implemented *See Row Information* Yes  Treat  MEWS Interventions Administered scheduled meds/treatments  Pain Scale 0-10  Pain Score 0  Take Vital Signs  Increase Vital Sign Frequency  Yellow: Q 2hr X 2 then Q 4hr X 2, if remains yellow, continue Q 4hrs  Escalate  MEWS: Escalate Yellow: discuss with charge nurse/RN and consider discussing with provider and RRT  Notify: Charge Nurse/RN  Name of Charge Nurse/RN Notified Elisa  Date Charge Nurse/RN Notified 05/13/20  Time Charge Nurse/RN Notified 1639  Notify: Provider  Provider Name/Title Elgergawy/Attending  Date Provider Notified 05/13/20  Time Provider Notified 1600  Notification Type Page (SecureChat)  Notification Reason Change in status (Temp/HR/MEWS)  Provider response No new orders  Date of Provider Response 05/13/20  Time of Provider Response 1600  Document  Patient Outcome Stabilized after interventions  Progress note created (see row info) Yes

## 2020-05-14 ENCOUNTER — Inpatient Hospital Stay (HOSPITAL_COMMUNITY): Payer: Medicare Other

## 2020-05-14 DIAGNOSIS — U099 Post covid-19 condition, unspecified: Secondary | ICD-10-CM | POA: Diagnosis not present

## 2020-05-14 DIAGNOSIS — J189 Pneumonia, unspecified organism: Secondary | ICD-10-CM | POA: Diagnosis not present

## 2020-05-14 DIAGNOSIS — A419 Sepsis, unspecified organism: Secondary | ICD-10-CM

## 2020-05-14 DIAGNOSIS — G8929 Other chronic pain: Secondary | ICD-10-CM

## 2020-05-14 DIAGNOSIS — J449 Chronic obstructive pulmonary disease, unspecified: Secondary | ICD-10-CM | POA: Diagnosis not present

## 2020-05-14 DIAGNOSIS — N1832 Chronic kidney disease, stage 3b: Secondary | ICD-10-CM

## 2020-05-14 DIAGNOSIS — I2693 Single subsegmental pulmonary embolism without acute cor pulmonale: Secondary | ICD-10-CM

## 2020-05-14 DIAGNOSIS — M545 Low back pain, unspecified: Secondary | ICD-10-CM

## 2020-05-14 DIAGNOSIS — R053 Chronic cough: Secondary | ICD-10-CM

## 2020-05-14 DIAGNOSIS — R509 Fever, unspecified: Secondary | ICD-10-CM

## 2020-05-14 LAB — HIV ANTIBODY (ROUTINE TESTING W REFLEX): HIV Screen 4th Generation wRfx: NONREACTIVE

## 2020-05-14 LAB — CULTURE, BLOOD (ROUTINE X 2)
Culture: NO GROWTH
Culture: NO GROWTH
Special Requests: ADEQUATE
Special Requests: ADEQUATE

## 2020-05-14 LAB — CBC WITH DIFFERENTIAL/PLATELET
Abs Immature Granulocytes: 0.34 10*3/uL — ABNORMAL HIGH (ref 0.00–0.07)
Basophils Absolute: 0 10*3/uL (ref 0.0–0.1)
Basophils Relative: 1 %
Eosinophils Absolute: 0.3 10*3/uL (ref 0.0–0.5)
Eosinophils Relative: 5 %
HCT: 28.2 % — ABNORMAL LOW (ref 39.0–52.0)
Hemoglobin: 9 g/dL — ABNORMAL LOW (ref 13.0–17.0)
Immature Granulocytes: 6 %
Lymphocytes Relative: 7 %
Lymphs Abs: 0.4 10*3/uL — ABNORMAL LOW (ref 0.7–4.0)
MCH: 28.7 pg (ref 26.0–34.0)
MCHC: 31.9 g/dL (ref 30.0–36.0)
MCV: 89.8 fL (ref 80.0–100.0)
Monocytes Absolute: 0.3 10*3/uL (ref 0.1–1.0)
Monocytes Relative: 6 %
Neutro Abs: 4.3 10*3/uL (ref 1.7–7.7)
Neutrophils Relative %: 75 %
Platelets: 242 10*3/uL (ref 150–400)
RBC: 3.14 MIL/uL — ABNORMAL LOW (ref 4.22–5.81)
RDW: 18.8 % — ABNORMAL HIGH (ref 11.5–15.5)
WBC: 5.7 10*3/uL (ref 4.0–10.5)
nRBC: 0.5 % — ABNORMAL HIGH (ref 0.0–0.2)

## 2020-05-14 LAB — D-DIMER, QUANTITATIVE: D-Dimer, Quant: 2.27 ug/mL-FEU — ABNORMAL HIGH (ref 0.00–0.50)

## 2020-05-14 LAB — COMPREHENSIVE METABOLIC PANEL
ALT: 19 U/L (ref 0–44)
AST: 33 U/L (ref 15–41)
Albumin: 2.9 g/dL — ABNORMAL LOW (ref 3.5–5.0)
Alkaline Phosphatase: 78 U/L (ref 38–126)
Anion gap: 8 (ref 5–15)
BUN: 16 mg/dL (ref 8–23)
CO2: 23 mmol/L (ref 22–32)
Calcium: 9.7 mg/dL (ref 8.9–10.3)
Chloride: 108 mmol/L (ref 98–111)
Creatinine, Ser: 1.31 mg/dL — ABNORMAL HIGH (ref 0.61–1.24)
GFR, Estimated: 57 mL/min — ABNORMAL LOW (ref >60)
Glucose, Bld: 119 mg/dL — ABNORMAL HIGH (ref 70–99)
Potassium: 3.9 mmol/L (ref 3.5–5.1)
Sodium: 139 mmol/L (ref 135–145)
Total Bilirubin: 1.1 mg/dL (ref 0.3–1.2)
Total Protein: 5.2 g/dL — ABNORMAL LOW (ref 6.5–8.1)

## 2020-05-14 LAB — LACTIC ACID, PLASMA
Lactic Acid, Venous: 1.6 mmol/L (ref 0.5–1.9)
Lactic Acid, Venous: 1.4 mmol/L (ref 0.5–1.9)

## 2020-05-14 LAB — CRYPTOCOCCAL ANTIGEN: Crypto Ag: NEGATIVE

## 2020-05-14 LAB — GLUCOSE, CAPILLARY
Glucose-Capillary: 104 mg/dL — ABNORMAL HIGH (ref 70–99)
Glucose-Capillary: 97 mg/dL (ref 70–99)
Glucose-Capillary: 99 mg/dL (ref 70–99)
Glucose-Capillary: 111 mg/dL — ABNORMAL HIGH (ref 70–99)

## 2020-05-14 LAB — CORTISOL: Cortisol, Plasma: 17.7 ug/dL

## 2020-05-14 LAB — SAR COV2 SEROLOGY (COVID19)AB(IGG),IA: SARS-CoV-2 Ab, IgG: NONREACTIVE

## 2020-05-14 LAB — C-REACTIVE PROTEIN: CRP: 5.7 mg/dL — ABNORMAL HIGH (ref <1.0)

## 2020-05-14 LAB — BRAIN NATRIURETIC PEPTIDE: B Natriuretic Peptide: 144.3 pg/mL — ABNORMAL HIGH (ref 0.0–100.0)

## 2020-05-14 LAB — MAGNESIUM: Magnesium: 1.8 mg/dL (ref 1.7–2.4)

## 2020-05-14 LAB — FERRITIN: Ferritin: 2136 ng/mL — ABNORMAL HIGH (ref 24–336)

## 2020-05-14 LAB — STREP PNEUMONIAE URINARY ANTIGEN: Strep Pneumo Urinary Antigen: NEGATIVE

## 2020-05-14 LAB — CK: Total CK: 27 U/L — ABNORMAL LOW (ref 49–397)

## 2020-05-14 LAB — PROCALCITONIN: Procalcitonin: <0.1 ng/mL

## 2020-05-14 MED ORDER — COSYNTROPIN 0.25 MG IJ SOLR
0.2500 mg | Freq: Once | INTRAMUSCULAR | Status: AC
  Administered 2020-05-15: 0.25 mg via INTRAVENOUS
  Filled 2020-05-14: qty 0.25

## 2020-05-14 MED ORDER — ACETAMINOPHEN 650 MG RE SUPP
650.0000 mg | Freq: Four times a day (QID) | RECTAL | Status: DC | PRN
  Administered 2020-05-14 (×2): 650 mg via RECTAL
  Filled 2020-05-14 (×3): qty 1

## 2020-05-14 MED ORDER — VANCOMYCIN HCL 1500 MG/300ML IV SOLN
1500.0000 mg | Freq: Once | INTRAVENOUS | Status: AC
  Administered 2020-05-14: 1500 mg via INTRAVENOUS
  Filled 2020-05-14 (×2): qty 300

## 2020-05-14 MED ORDER — VANCOMYCIN HCL 1500 MG/300ML IV SOLN: 1500.0000 mg | INTRAVENOUS | Status: DC

## 2020-05-14 MED ORDER — LEVOFLOXACIN 750 MG PO TABS
750.0000 mg | ORAL_TABLET | Freq: Every day | ORAL | Status: DC
  Filled 2020-05-14: qty 1

## 2020-05-14 MED ORDER — LACTATED RINGERS IV SOLN: INTRAVENOUS | Status: DC

## 2020-05-14 MED ORDER — METOPROLOL TARTRATE 5 MG/5ML IV SOLN
5.0000 mg | Freq: Once | INTRAVENOUS | Status: AC
  Administered 2020-05-14: 5 mg via INTRAVENOUS
  Filled 2020-05-14: qty 5

## 2020-05-14 MED ORDER — IOHEXOL 350 MG/ML SOLN
80.0000 mL | Freq: Once | INTRAVENOUS | Status: AC | PRN
  Administered 2020-05-14: 80 mL via INTRAVENOUS

## 2020-05-14 NOTE — Progress Notes (Signed)
   05/13/20 2320  Assess: MEWS Score  Temp (!) 102.4 F (39.1 C)  BP (!) 168/111  Pulse Rate (!) 118  ECG Heart Rate (!) 118  Resp (!) 24  Level of Consciousness Alert  SpO2 98 %  O2 Device Nasal Cannula  O2 Flow Rate (L/min) 2 L/min  Assess: MEWS Score  MEWS Temp 2  MEWS Systolic 0  MEWS Pulse 2  MEWS RR 1  MEWS LOC 0  MEWS Score 5  MEWS Score Color Red  Assess: if the MEWS score is Yellow or Red  Were vital signs taken at a resting state? Yes  Focused Assessment No change from prior assessment  Early Detection of Sepsis Score *See Row Information* Medium  MEWS guidelines implemented *See Row Information* Yes  Treat  MEWS Interventions Administered scheduled meds/treatments;Administered prn meds/treatments;Escalated (See documentation below)  Pain Scale 0-10  Pain Score 0  Take Vital Signs  Increase Vital Sign Frequency  Red: Q 1hr X 4 then Q 4hr X 4, if remains red, continue Q 4hrs  Escalate  MEWS: Escalate Red: discuss with charge nurse/RN and provider, consider discussing with RRT  Notify: Charge Nurse/RN  Name of Charge Nurse/RN Notified Nikki, RN  Date Charge Nurse/RN Notified 05/13/20  Time Charge Nurse/RN Notified 2324  Notify: Provider  Provider Name/Title Chotiner, B  Date Provider Notified 05/13/20  Time Provider Notified 2324  Notification Type Page  Notification Reason Change in status;Critical result  Provider response At bedside;See new orders  Document  Patient Outcome Stabilized after interventions  Progress note created (see row info) Yes   Pt cont to spike temps and was intermittently tachycardic. Tylenol and metoprolol given per order. Chotiner MD aware, see orders. Awaiting CT, IV team currently trying to place new IV. Will cont to monitor.

## 2020-05-14 NOTE — Consult Note (Signed)
Date of Admission:  05/09/2020          Reason for Consult: Fever of unknown origin    Referring Provider: Dr. Waldron Labs   Assessment:  1. FUO 2. Episodes of hypotension 3. Rheumatoid arthritis on chronic steroids 4. COVID 19 infection in January with persistently positive PCR 5. Deep venous thrombosis with pulmonary embolism 6. New groundglass opacity on CT scan of the lungs 7. Back pain and a history of prior lumbar surgery herniated nucleus pulposis 8. History of diverticulosis 9. COPD 10. Extensive travel throughout the world in his youth  Plan:  1. Agree with CT of the abdomen and pelvis with contrast tomorrow given that he had a high contrast load last night 2. MRI lumbosacral area with gadolinium 3. Obtain 2D echocardiogram 4. Check cycle threshold on Covid PCR 5. Agree with assessing for adrenal insufficiency 6. Will order panel of FUO labs including HIV viral hepatitis panel ANA LDH QuantiFERON gold cryptococcal antigen histoplasma antigen Blastomyces antigen CMV and Epstein-Barr virus titers 7. Continue pay  attention to pertinent diagnostic clues for origin of his fevers 8. Discontinue the vancomycin as I do not see a target for this nephrotoxic drug and nephrotoxicity is increase in the presence of Zosyn being given 9. We will discontinue levofloxacin as my suspicion for Legionella is low 10. Also discontinue his probiotics since giving probiotics the patient is on chronic immune suppressive therapy pose a risk of causing bacteremias and fungemias  Principal Problem:   Sepsis due to pneumonia [facility]) Active Problems:   Hyperlipidemia   BPH (benign prostatic hyperplasia)   Stage 3b chronic kidney disease (HCC)   Post-COVID-19 syndrome manifesting as chronic cough   COPD without exacerbation (HCC)   Scheduled Meds: . aspirin  81 mg Oral Daily  . Chlorhexidine Gluconate Cloth  6 each Topical Q0600  . enoxaparin (LOVENOX) injection  90 mg Subcutaneous Q12H  .  insulin aspart  0-15 Units Subcutaneous TID WC  . insulin aspart  0-5 Units Subcutaneous QHS  . lactose free nutrition  237 mL Oral TID WC  . melatonin  5 mg Oral QHS  . metoprolol tartrate  25 mg Oral BID  . midodrine  5 mg Oral TID WC  . mometasone-formoterol  2 puff Inhalation BID  . pantoprazole  40 mg Oral Daily  . simvastatin  20 mg Oral Daily  . sodium chloride flush  3 mL Intravenous Q12H  . tamsulosin  0.4 mg Oral Daily  . umeclidinium bromide  1 puff Inhalation Daily   Continuous Infusions: . sodium chloride    . albumin human 25 g (05/13/20 2246)  . lactated ringers 100 mL/hr at 05/14/20 0849  . piperacillin-tazobactam (ZOSYN)  IV 3.375 g (05/14/20 0111)   PRN Meds:.sodium chloride, acetaminophen, acetaminophen **OR** [DISCONTINUED] acetaminophen, albuterol, lip balm, metoprolol tartrate, [DISCONTINUED] ondansetron **OR** ondansetron (ZOFRAN) IV  HPI: CLAVIN Tyrone Roman is a 75 y.o. male with past medical history significant for rheumatoid arthritis coronary artery disease status post stenting hypertension hyperlipidemia COPD BPH, history of prior lumbar surgery due to herniated nucleus pulposis, diverticulosis extensive travel history who was admitted in January with COVID-19 infection despite vaccination and boosting.  He has had multiple hospitalizations in the interim is largely been bedbound.  Admitted for acute hypoxic respiratory failure thought to be due to acute on chronic congestive heart failure.  In the interim he was found to have an acute deep venous thrombosis and last night CT angiogram showed a pulmonary  embolism with chronic changes from his COVID-19 infection and a new infiltrate seen that was described as a groundglass opacity.  Note he is continued to have fevers despite being anticoagulated for his deep venous thrombosis and pulmonary embolism and despite having been on broad-spectrum antibiotics with vancomycin and Zosyn.  We will discontinue the vancomycin  as there is no clear target for that.  Zosyn is reasonable to continue given that he could have intra-abdominal pathology though his exam is fairly benign.  He apparently has been complaining of low back pain and today when I asked him if he had back pain he said yes.  He is not known to have a bedsore.  Given his history of lumbar surgery and back pain getting an MRI with gadolinium is reasonable.  I 100% agree with getting a CT of the abdomen pelvis tomorrow when he can get contrast.  We will get a 2D echocardiogram.  I agree also with checking the cycle threshold on his COVID-19 PCR if that is available perhaps him being immunosuppressed has caused him to have persistent COVID-19 infection given his chronic corticosteroid use.  In talking to his daughter who was at the bedside and his other daughter over the telephone Elise has had an extensive travel history in his 15s when he traveled throughout the world including [place].  He also traveled throughout the night and stays including the desert [place].  Therefore we are going to check QuantiFERON gold as part of FUO work-up but also check for dimorphic fungi including cryptococcus histoplasma Blastomyces and Coccidioides    Review of Systems: Review of Systems  Unable to perform ROS: Mental acuity  Gastrointestinal: Negative for diarrhea.    Past Medical History:  Diagnosis Date  . Allergy   . Barrett's esophageal ulceration   . BPH (benign prostatic hyperplasia)   . CAD (coronary artery disease)    sees Dr. Mar Daring  . Cataract    both eyes removed   . Colonic polyp   . Concussion with loss of consciousness of 30 minutes or less   . Contact dermatitis   . Contact with or exposure to venereal diseases   . COPD (chronic obstructive pulmonary disease) ([place])    sees Dr. Kara Mead   . Diverticulosis of colon   . Dysphagia   . GERD (gastroesophageal reflux disease)    past hx- on meds   . HNP (herniated  nucleus pulposus), lumbar    recurrent  . Hyperlipidemia   . Hypertension   . Myocardial infarction [facility])    2011- stent placed  . Nephrolithiasis   . Rheumatoid arthritis [facility])    sees Dr. Gavin Pound   . SOB (shortness of breath)     Social History   Tobacco Use  . Smoking status: Former Smoker    Packs/day: 1.00    Years: 20.00    Pack years: 20.00    Types: Cigarettes    Quit date: 10/12/1988    Years since quitting: 31.6  . Smokeless tobacco: Never Used  . Tobacco comment: 1960's   Vaping Use  . Vaping Use: Never used  Substance Use Topics  . Alcohol use: Not Currently    Alcohol/week: 0.0 standard drinks    Comment: rare  . Drug use: No    Family History  Problem Relation Age of Onset  . Heart attack Father        cardiovascular disorder  . Arthritis Father  family hx  . Colon cancer Father        mets  . Prostate cancer Father        1st degree relative  . Arthritis Mother   . Colon polyps Mother   . Melanoma Mother   . Dementia Mother   . Diabetes Paternal Uncle   . Colon cancer Paternal Uncle   . Stomach cancer Paternal Uncle   . Melanoma Paternal Uncle   . Cancer Maternal Grandmother   . Skin cancer Daughter   . Colon polyps Sister   . Esophageal cancer Neg Hx   . Rectal cancer Neg Hx    Allergies  Allergen Reactions  . Cephalexin Shortness Of Breath, Rash and Other (See Comments)    Tolerated Augmentin 2015 and 2017 (12-2017). Tolerated nafcillin 12/2017 PATIENT HAS HAD A PCN REACTION WITH IMMEDIATE RASH, FACIAL/TONGUE/THROAT SWELLING, SOB, OR LIGHTHEADEDNESS WITH HYPOTENSION:  #  #  YES  #  #  Has patient had a PCN reaction causing severe rash involving mucus membranes or skin necrosis: No Has patient had a PCN reaction that required hospitalization: No Has patient had a PCN reaction occurring within the last 10 years: No If all of the above answers are "NO", then may proceed  . Clarithromycin Shortness Of Breath and Rash  .  Certolizumab Pegol Hives  . Tocilizumab Hives  . Doxycycline Rash  . Hydroxyzine Rash  . Lidoderm Other (See Comments)    "made me act weird"  . Pyrithione Zinc Rash    OBJECTIVE: Blood pressure (!) 132/95, pulse (!) 110, temperature (!) 102 F (38.9 C), temperature source Axillary, resp. rate 17, height 6\' 2"  (1.88 m), weight 88 kg, SpO2 91 %.  Physical Exam Constitutional:      Appearance: He is well-developed.  HENT:     Head: Normocephalic and atraumatic.  Eyes:     Extraocular Movements: Extraocular movements intact.     Conjunctiva/sclera: Conjunctivae normal.  Cardiovascular:     Rate and Rhythm: Normal rate and regular rhythm.     Heart sounds: No murmur heard. No friction rub. No gallop.   Pulmonary:     Effort: Pulmonary effort is normal. No respiratory distress.     Breath sounds: Normal breath sounds. No stridor. No wheezing or rhonchi.  Abdominal:     General: Abdomen is flat. There is no distension.     Palpations: Abdomen is soft. There is no mass.     Tenderness: There is no abdominal tenderness. There is no guarding or rebound.     Hernia: No hernia is present.  Musculoskeletal:        General: No tenderness or deformity. Normal range of motion.     Cervical back: Normal range of motion and neck supple.     Right lower leg: No edema.  Skin:    General: Skin is warm and dry.     Coloration: Skin is not pale.     Findings: No erythema or rash.  Neurological:     General: No focal deficit present.     Mental Status: He is alert.  Psychiatric:        Attention and Perception: He is inattentive.        Speech: Speech is delayed.        Behavior: Behavior is slowed. Behavior is cooperative.        Cognition and Memory: Memory is impaired. He exhibits impaired recent memory.     Lab Results Lab Results  Component  Value Date   WBC 5.7 05/14/2020   HGB 9.0 (L) 05/14/2020   HCT 28.2 (L) 05/14/2020   MCV 89.8 05/14/2020   PLT 242 05/14/2020    Lab  Results  Component Value Date   CREATININE 1.31 (H) 05/14/2020   BUN 16 05/14/2020   NA 139 05/14/2020   K 3.9 05/14/2020   CL 108 05/14/2020   CO2 23 05/14/2020    Lab Results  Component Value Date   ALT 19 05/14/2020   AST 33 05/14/2020   ALKPHOS 78 05/14/2020   BILITOT 1.1 05/14/2020     Microbiology: Recent Results (from the past 240 hour(s))  Urine culture     Status: None   Collection Time: 05/09/20  2:07 AM   Specimen: In/Out Cath Urine  Result Value Ref Range Status   Specimen Description IN/OUT CATH URINE  Final   Special Requests NONE  Final   Culture   Final    NO GROWTH Performed at [facility], [address], [place], [place] [address]    Report Status 05/11/2020 FINAL  Final  Resp Panel by RT-PCR (Flu A&B, Covid) Nasopharyngeal Swab     Status: Abnormal   Collection Time: 05/09/20 12:12 PM   Specimen: Nasopharyngeal Swab; Nasopharyngeal(NP) swabs in vial transport medium  Result Value Ref Range Status   SARS Coronavirus 2 by RT PCR POSITIVE (A) NEGATIVE Final    Comment: RESULT CALLED TO, READ BACK BY AND VERIFIED WITH: RN P [facility] [id number] AT 6160 BY CM (NOTE) SARS-CoV-2 target nucleic acids are DETECTED.  The SARS-CoV-2 RNA is generally detectable in upper respiratory specimens during the acute phase of infection. Positive results are indicative of the presence of the identified virus, but do not rule out bacterial infection or co-infection with other pathogens not detected by the test. Clinical correlation with patient history and other diagnostic information is necessary to determine patient infection status. The expected result is Negative.  Fact Sheet for Patients: EntrepreneurPulse.com.au  Fact Sheet for Healthcare Providers: IncredibleEmployment.be  This test is not yet approved or cleared by the [place] FDA and  has been authorized for detection and/or diagnosis of SARS-CoV-2 by FDA under an  Emergency Use Authorization (EUA).  This EUA will remain in effect (meaning this test can be u sed) for the duration of  the COVID-19 declaration under Section 564(b)(1) of the Act, 21 U.S.C. section 360bbb-3(b)(1), unless the authorization is terminated or revoked sooner.     Influenza A by PCR NEGATIVE NEGATIVE Final   Influenza B by PCR NEGATIVE NEGATIVE Final    Comment: (NOTE) The Xpert Xpress SARS-CoV-2/FLU/RSV plus assay is intended as an aid in the diagnosis of influenza from Nasopharyngeal swab specimens and should not be used as a sole basis for treatment. Nasal washings and aspirates are unacceptable for Xpert Xpress SARS-CoV-2/FLU/RSV testing.  Fact Sheet for Patients: EntrepreneurPulse.com.au  Fact Sheet for Healthcare Providers: IncredibleEmployment.be  This test is not yet approved or cleared by the [place] FDA and has been authorized for detection and/or diagnosis of SARS-CoV-2 by FDA under an Emergency Use Authorization (EUA). This EUA will remain in effect (meaning this test can be used) for the duration of the COVID-19 declaration under Section 564(b)(1) of the Act, 21 U.S.C. section 360bbb-3(b)(1), unless the authorization is terminated or revoked.  Performed at [facility], [place] [address] [place],  [address]   Blood Culture (routine x 2)     Status: None (Preliminary result)  Collection Time: 05/09/20 12:12 PM   Specimen: BLOOD  Result Value Ref Range Status   Specimen Description BLOOD SITE NOT SPECIFIED  Final   Special Requests   Final    BOTTLES DRAWN AEROBIC AND ANAEROBIC Blood Culture adequate volume   Culture   Final    NO GROWTH 4 DAYS Performed at [facility], [address], [place], [place] [address]    Report Status PENDING  Incomplete  Blood Culture (routine x 2)     Status: None (Preliminary result)   Collection Time: 05/09/20 12:12 PM   Specimen: BLOOD  Result Value Ref  Range Status   Specimen Description BLOOD BLOOD LEFT HAND  Final   Special Requests   Final    BOTTLES DRAWN AEROBIC AND ANAEROBIC Blood Culture adequate volume   Culture   Final    NO GROWTH 4 DAYS Performed at [facility], [place] [address] [place], [place] [address]    Report Status PENDING  Incomplete  MRSA PCR Screening     Status: Abnormal   Collection Time: 05/09/20 12:30 PM   Specimen: Nasal Mucosa; Nasopharyngeal  Result Value Ref Range Status   MRSA by PCR POSITIVE (A) NEGATIVE Final    Comment:        The GeneXpert MRSA Assay (FDA approved for NASAL specimens only), is one component of a comprehensive MRSA colonization surveillance program. It is not intended to diagnose MRSA infection nor to guide or monitor treatment for MRSA infections. RESULT CALLED TO, READ BACK BY AND VERIFIED WITH: IDOL,K RN AT 2212 05/09/2020 MITCHELL,L Performed at [facility], [place] [address] [place], [place] [address]     Alcide Evener, [place] [facility] [phone] pager  05/14/2020, 11:14 AM

## 2020-05-14 NOTE — Progress Notes (Signed)
PROGRESS NOTE                                                                                                                                                                                                             Patient Demographics:    Tyrone Roman, is a 75 y.o. male, DOB - 12-01-1945, KVQ:[id number]  Outpatient Primary MD for the patient is [name], MD    LOS - 5  Admit date - 05/09/2020    Chief Complaint  Patient presents with  . Tachycardia       Brief Narrative (HPI from H&P)    - Tyrone Roman is a 75 y.o. male with medical history significant of RA; CAD s/p stent; HTN; HLD; COPD; and BPH presenting with tachycardia.  He can't remember what happened, work-up here suggested hypoxia CHF/pneumonia of care, admitted for further work-up, respiratory status improved after initial diuresis, patient kept having episodes of SIRS, fever, tachycardia and hypotension, as well having elevated D-dimers, venous Dopplers was significant for left lower extremity DVT.   Subjective:    [name] with fever of 102.7 this morning, more confused. .   Assessment  & Plan :     Acute Hypoxic Resp. -Multifactorial, some volume overload , as well evidence of PE, and difficult pneumonia. -Encouraged to use  incentive spirometry and flutter valve  SIRS/FUO - patient continues to have intermittent fever, hypotensive episode and he is immunosuppressed due to his medication from rheumatoid arthritis. -So far septic work-up is nonrevealing, identified fever this morning 102.7, so far blood cultures are negative, UA remains negative as well . -Covaryx management per ID, vancomycin has been discontinued, continue with Zosyn  -MRI lumbar spine with/without contrast with no evidence of discitis or infection. -CT abdomen and pelvis with contrast in a.m. given he received high contrast load last night. -Check cycle threshold Covid  PCR, SARS antibody as well  -Further labs were ordered by ID including HIV viral hepatitis panel ANA LDH QuantiFERON gold cryptococcal antigen histoplasma antigen Blastomyces antigen CMV and Epstein-Barr virus titers  Questionable steroid dependency  -Patient will stress dose IV hydrocortisone, which has been stopped, he remains with some episodes of soft blood pressure and orthostatics, will perform stim test in a.m. given he will be 72 hours of steroids .  Acute DVT/PE -Venous Doppler significant  for left lower extremity extensive acute DVT, currently on Lovenox, will transition to Eliquis in 1 to 2 days  SpO2: 93 % O2 Flow Rate (L/min): 2 L/min FiO2 (%): 28 %  Recent Labs  Lab 05/09/20 1212 05/09/20 1621 05/10/20 0106 05/10/20 0807 05/11/20 0131 05/12/20 0300 05/13/20 0135 05/13/20 0900 05/14/20 0010 05/14/20 0257  WBC 8.3  --  6.1  --  3.9* 7.7 8.1  --  5.7  --   HGB 11.3*  --  8.5*  --  9.5* 8.9* 9.5*  --  9.0*  --   HCT 35.9*  --  27.5*  --  29.8* 28.0* 29.8*  --  28.2*  --   PLT 187  --  140*  --  193 210 248  --  242  --   CRP  --   --   --  11.2* 10.5* 5.6* 3.2*  --  5.7*  --   BNP  --   --   --  107.5* 122.6* 98.9 72.6  --  144.3*  --   DDIMER  --   --   --  3.48* 2.75* 3.45* 3.76*  --  2.27*  --   PROCALCITON <0.10  --   --   --   --   --   --  0.11 <0.10  --   AST 44*  --   --   --  25 23 36  --  33  --   ALT 22  --   --   --  17 13 20   --  19  --   ALKPHOS 101  --   --   --  74 63 68  --  78  --   BILITOT 1.5*  --   --   --  0.9 0.4 0.7  --  1.1  --   ALBUMIN 2.4*  --   --   --  1.9* 1.9* 2.1*  --  2.9*  --   INR 1.2  --   --   --   --   --   --   --   --   --   LATICACIDVEN 3.3* 1.6  --   --   --   --   --   --  1.6 1.4  SARSCOV2NAA POSITIVE*  --   --   --   --   --   --   --   --   --      Deconditioning, -Patient has had two recent admissions to the hospital . Supportive care with PT OT. At risk for delirium, minimize narcotics and benzodiazepines.    Metabolic encephalopathy. -Resolved, mentation back to baseline  Acute on Chronic diastolic CHF recent EF 55 to 60%. C  BPH. On Flomax.  Dyslipidemia. On statin continue.  History of Rheumatoid arthritis. Takes Rituxan every 4 months.  HTN  - blood pressure slightly on the lower side with tachycardia, on low-dose beta-blocker  - few months ago he had an episode of adrenal insufficiency, he was treated with stress dose steroids, will discontinue today monitor response  Hyperglycemia -pre Diabetes with A1c of 6.3       Condition - Extremely Guarded  Family Communication  : Discussed with daughter at bedside  Code Status : Full code, confirmed by patient and daughter  Consults  :  Pall.Care  PUD Prophylaxis : PPI   Procedures  :     Leg ultrasound.      Disposition  Plan  :    Status is: Inpatient  Remains inpatient appropriate because:IV treatments appropriate due to intensity of illness or inability to take PO   Dispo: The patient is from: SNF              Anticipated d/c is to: SNF              Patient currently is not medically stable to d/c.   Difficult to place patient No   DVT Prophylaxis  :  Lovenox   Lab Results  Component Value Date   PLT 242 05/14/2020    Diet :  Diet Order            Diet Carb Modified Fluid consistency: Thin; Room service appropriate? Yes  Diet effective now                  Inpatient Medications  Scheduled Meds: . aspirin  81 mg Oral Daily  . Chlorhexidine Gluconate Cloth  6 each Topical Q0600  . enoxaparin (LOVENOX) injection  90 mg Subcutaneous Q12H  . insulin aspart  0-15 Units Subcutaneous TID WC  . insulin aspart  0-5 Units Subcutaneous QHS  . lactose free nutrition  237 mL Oral TID WC  . melatonin  5 mg Oral QHS  . metoprolol tartrate  25 mg Oral BID  . midodrine  5 mg Oral TID WC  . mometasone-formoterol  2 puff Inhalation BID  . pantoprazole  40 mg Oral Daily  . simvastatin  20 mg Oral Daily   . sodium chloride flush  3 mL Intravenous Q12H  . tamsulosin  0.4 mg Oral Daily  . umeclidinium bromide  1 puff Inhalation Daily   Continuous Infusions: . sodium chloride    . lactated ringers 100 mL/hr at 05/14/20 0849  . piperacillin-tazobactam (ZOSYN)  IV 3.375 g (05/14/20 1253)   PRN Meds:.sodium chloride, acetaminophen, acetaminophen **OR** [DISCONTINUED] acetaminophen, albuterol, lip balm, metoprolol tartrate, [DISCONTINUED] ondansetron **OR** ondansetron (ZOFRAN) IV  Antibiotics  :    Anti-infectives (From admission, onward)   Start     Dose/Rate Route Frequency Ordered Stop   05/15/20 2000  vancomycin (VANCOREADY) IVPB 1500 mg/300 mL  Status:  Discontinued        1,500 mg 150 mL/hr over 120 Minutes Intravenous Every 24 hours 05/14/20 0034 05/14/20 1109   05/14/20 1100  levofloxacin (LEVAQUIN) tablet 750 mg  Status:  Discontinued        750 mg Oral Daily 05/14/20 0902 05/14/20 1109   05/14/20 0130  vancomycin (VANCOREADY) IVPB 1500 mg/300 mL        1,500 mg 150 mL/hr over 120 Minutes Intravenous  Once 05/14/20 0034 05/14/20 0458   05/10/20 1700  vancomycin (VANCOREADY) IVPB 1500 mg/300 mL  Status:  Discontinued        1,500 mg 150 mL/hr over 120 Minutes Intravenous Every 24 hours 05/10/20 1613 05/12/20 1041   05/10/20 1700  piperacillin-tazobactam (ZOSYN) IVPB 3.375 g        3.375 g 12.5 mL/hr over 240 Minutes Intravenous Every 8 hours 05/10/20 1613     05/10/20 1400  ceFEPIme (MAXIPIME) 2 g in sodium chloride 0.9 % 100 mL IVPB  Status:  Discontinued        2 g 200 mL/hr over 30 Minutes Intravenous Every 8 hours 05/10/20 1002 05/10/20 1108   05/10/20 1330  vancomycin (VANCOREADY) IVPB 1500 mg/300 mL  Status:  Discontinued        1,500 mg 150 mL/hr over  120 Minutes Intravenous Every 24 hours 05/09/20 1355 05/10/20 1108   05/10/20 1215  levofloxacin (LEVAQUIN) tablet 750 mg  Status:  Discontinued        750 mg Oral Daily 05/10/20 1115 05/10/20 2003   05/10/20 0100   ceFEPIme (MAXIPIME) 2 g in sodium chloride 0.9 % 100 mL IVPB  Status:  Discontinued        2 g 200 mL/hr over 30 Minutes Intravenous Every 12 hours 05/09/20 1355 05/10/20 1002   05/09/20 1230  ceFEPIme (MAXIPIME) 2 g in sodium chloride 0.9 % 100 mL IVPB        2 g 200 mL/hr over 30 Minutes Intravenous  Once 05/09/20 1227 05/09/20 1324   05/09/20 1230  vancomycin (VANCOREADY) IVPB 2000 mg/400 mL        2,000 mg 200 mL/hr over 120 Minutes Intravenous  Once 05/09/20 1228 05/09/20 1546       Time Spent in minutes  30   Emeline Gins Casin Federici M.D on 05/14/2020 at 1:32 PM  To page go to [email or web address]   [facility] -  Office  [phone]    See all Orders from today for further details    Objective:   Vitals:   05/14/20 0723 05/14/20 0731 05/14/20 1208 05/14/20 1323  BP: 135/90 (!) 132/95 (!) 123/112 103/77  Pulse: (!) 110 (!) 110 (!) 102 (!) 112  Resp: 15 17 20  (!) 30  Temp: 97.8 F (36.6 C) (!) 102 F (38.9 C) (!) 101.8 F (38.8 C) (!) 102.2 F (39 C)  TempSrc: Oral Axillary Axillary Oral  SpO2: 90% 91% 93% 93%  Weight:      Height:        Wt Readings from Last 3 Encounters:  05/09/20 88 kg  05/05/20 88.5 kg  04/26/20 88.5 kg     Intake/Output Summary (Last 24 hours) at 05/14/2020 1332 Last data filed at 05/14/2020 0928 Gross per 24 hour  Intake 947.28 ml  Output 2725 ml  Net -1777.72 ml     Physical Exam  Awake, but more confused this morning. Symmetrical Chest wall movement, Good air movement bilaterally, CTAB RRR,No Gallops,Rubs or new Murmurs, No Parasternal Heave +ve B.Sounds, Abd Soft, No tenderness, No rebound - guarding or rigidity. No Cyanosis, Clubbing or edema, No new Rash or bruise        Data Review:    CBC Recent Labs  Lab 05/09/20 1212 05/10/20 0106 05/11/20 0131 05/12/20 0300 05/13/20 0135 05/14/20 0010  WBC 8.3 6.1 3.9* 7.7 8.1 5.7  HGB 11.3* 8.5* 9.5* 8.9* 9.5* 9.0*  HCT 35.9* 27.5* 29.8* 28.0* 29.8* 28.2*  PLT 187 140* 193  210 248 242  MCV 89.5 90.2 87.9 87.8 87.9 89.8  MCH 28.2 27.9 28.0 27.9 28.0 28.7  MCHC 31.5 30.9 31.9 31.8 31.9 31.9  RDW 18.2* 18.2* 18.0* 18.0* 18.3* 18.8*  LYMPHSABS 0.4*  --  0.4* 0.5* 0.6* 0.4*  MONOABS 0.5  --  0.1 0.6 0.5 0.3  EOSABS 0.2  --  0.0 0.0 0.2 0.3  BASOSABS 0.0  --  0.0 0.0 0.0 0.0    Recent Labs  Lab 05/09/20 1212 05/09/20 1621 05/10/20 0106 05/10/20 0807 05/11/20 0131 05/12/20 0300 05/12/20 0741 05/13/20 0135 05/13/20 0900 05/14/20 0010 05/14/20 0257  NA 136  --  135  --  136 133*  --  139  --  139  --   K 4.4  --  3.6  --  3.8 3.0*  --  3.0*  --  3.9  --   CL 101  --  107  --  103 102  --  104  --  108  --   CO2 22  --  21*  --  24 22  --  24  --  23  --   GLUCOSE 128*  --  143*  --  177* 292*  --  111*  --  119*  --   BUN 19  --  17  --  17 23  --  23  --  16  --   CREATININE 1.48*  --  0.98  --  1.23 1.22  --  1.29*  --  1.31*  --   CALCIUM 9.8  --  9.1  --  9.3 9.0  --  9.4  --  9.7  --   AST 44*  --   --   --  25 23  --  36  --  33  --   ALT 22  --   --   --  17 13  --  20  --  19  --   ALKPHOS 101  --   --   --  74 63  --  68  --  78  --   BILITOT 1.5*  --   --   --  0.9 0.4  --  0.7  --  1.1  --   ALBUMIN 2.4*  --   --   --  1.9* 1.9*  --  2.1*  --  2.9*  --   MG  --   --   --  1.4* 1.8 1.7  --  1.6*  --  1.8  --   CRP  --   --   --  11.2* 10.5* 5.6*  --  3.2*  --  5.7*  --   DDIMER  --   --   --  3.48* 2.75* 3.45*  --  3.76*  --  2.27*  --   PROCALCITON <0.10  --   --   --   --   --   --   --  0.11 <0.10  --   LATICACIDVEN 3.3* 1.6  --   --   --   --   --   --   --  1.6 1.4  INR 1.2  --   --   --   --   --   --   --   --   --   --   TSH  --   --   --  1.040  --   --   --   --   --   --   --   HGBA1C  --   --   --   --   --   --  6.3*  --   --   --   --   BNP  --   --   --  107.5* 122.6* 98.9  --  72.6  --  144.3*  --     ------------------------------------------------------------------------------------------------------------------ No  results for input(s): CHOL, HDL, LDLCALC, TRIG, CHOLHDL, LDLDIRECT in the last 72 hours.  Lab Results  Component Value Date   HGBA1C 6.3 (H) 05/12/2020   ------------------------------------------------------------------------------------------------------------------ No results for input(s): TSH, T4TOTAL, T3FREE, THYROIDAB in the last 72 hours.  Invalid input(s): FREET3  Cardiac Enzymes No results for input(s): CKMB, TROPONINI, MYOGLOBIN in the last 168 hours.  Invalid input(s): CK ------------------------------------------------------------------------------------------------------------------    Component Value  Date/Time   BNP 144.3 (H) 05/14/2020 0010    Micro Results Recent Results (from the past 240 hour(s))  Urine culture     Status: None   Collection Time: 05/09/20  2:07 AM   Specimen: In/Out Cath Urine  Result Value Ref Range Status   Specimen Description IN/OUT CATH URINE  Final   Special Requests NONE  Final   Culture   Final    NO GROWTH Performed at [facility], [place] [address], [place], [place] [address]    Report Status 05/11/2020 FINAL  Final  Resp Panel by RT-PCR (Flu A&B, Covid) Nasopharyngeal Swab     Status: Abnormal   Collection Time: 05/09/20 12:12 PM   Specimen: Nasopharyngeal Swab; Nasopharyngeal(NP) swabs in vial transport medium  Result Value Ref Range Status   SARS Coronavirus 2 by RT PCR POSITIVE (A) NEGATIVE Final    Comment: RESULT CALLED TO, READ BACK BY AND VERIFIED WITH: RN [facility] [id number] AT 0539 BY CM (NOTE) SARS-CoV-2 target nucleic acids are DETECTED.  The SARS-CoV-2 RNA is generally detectable in upper respiratory specimens during the acute phase of infection. Positive results are indicative of the presence of the identified virus, but do not rule out bacterial infection or co-infection with other pathogens not detected by the test. Clinical correlation with patient history and other diagnostic information is necessary to  determine patient infection status. The expected result is Negative.  Fact Sheet for Patients: EntrepreneurPulse.com.au  Fact Sheet for Healthcare Providers: IncredibleEmployment.be  This test is not yet approved or cleared by the [place] FDA and  has been authorized for detection and/or diagnosis of SARS-CoV-2 by FDA under an Emergency Use Authorization (EUA).  This EUA will remain in effect (meaning this test can be u sed) for the duration of  the COVID-19 declaration under Section 564(b)(1) of the Act, 21 U.S.C. section 360bbb-3(b)(1), unless the authorization is terminated or revoked sooner.     Influenza A by PCR NEGATIVE NEGATIVE Final   Influenza B by PCR NEGATIVE NEGATIVE Final    Comment: (NOTE) The Xpert Xpress SARS-CoV-2/FLU/RSV plus assay is intended as an aid in the diagnosis of influenza from Nasopharyngeal swab specimens and should not be used as a sole basis for treatment. Nasal washings and aspirates are unacceptable for Xpert Xpress SARS-CoV-2/FLU/RSV testing.  Fact Sheet for Patients: EntrepreneurPulse.com.au  Fact Sheet for Healthcare Providers: IncredibleEmployment.be  This test is not yet approved or cleared by the [place] FDA and has been authorized for detection and/or diagnosis of SARS-CoV-2 by FDA under an Emergency Use Authorization (EUA). This EUA will remain in effect (meaning this test can be used) for the duration of the COVID-19 declaration under Section 564(b)(1) of the Act, 21 U.S.C. section 360bbb-3(b)(1), unless the authorization is terminated or revoked.  Performed at [facility], [place] [address], [place], [place] [address]   Blood Culture (routine x 2)     Status: None   Collection Time: 05/09/20 12:12 PM   Specimen: BLOOD  Result Value Ref Range Status   Specimen Description BLOOD SITE NOT SPECIFIED  Final   Special Requests   Final     BOTTLES DRAWN AEROBIC AND ANAEROBIC Blood Culture adequate volume   Culture   Final    NO GROWTH 5 DAYS Performed at [facility], [address], [place], [place] [address]    Report Status 05/14/2020 FINAL  Final  Blood Culture (routine x 2)     Status: None  Collection Time: 05/09/20 12:12 PM   Specimen: BLOOD  Result Value Ref Range Status   Specimen Description BLOOD BLOOD LEFT HAND  Final   Special Requests   Final    BOTTLES DRAWN AEROBIC AND ANAEROBIC Blood Culture adequate volume   Culture   Final    NO GROWTH 5 DAYS Performed at [facility], [address] [place], [place] [address]    Report Status 05/14/2020 FINAL  Final  MRSA PCR Screening     Status: Abnormal   Collection Time: 05/09/20 12:30 PM   Specimen: Nasal Mucosa; Nasopharyngeal  Result Value Ref Range Status   MRSA by PCR POSITIVE (A) NEGATIVE Final    Comment:        The GeneXpert MRSA Assay (FDA approved for NASAL specimens only), is one component of a comprehensive MRSA colonization surveillance program. It is not intended to diagnose MRSA infection nor to guide or monitor treatment for MRSA infections. RESULT CALLED TO, READ BACK BY AND VERIFIED WITH: IDOL,K RN AT 2212 05/09/2020 MITCHELL,L Performed at [facility], [place] [address] [place], [place] [address]   Culture, blood (routine x 2)     Status: None (Preliminary result)   Collection Time: 05/13/20 11:07 AM   Specimen: BLOOD RIGHT HAND  Result Value Ref Range Status   Specimen Description BLOOD RIGHT HAND  Final   Special Requests   Final    BOTTLES DRAWN AEROBIC AND ANAEROBIC Blood Culture adequate volume   Culture   Final    NO GROWTH 1 DAY Performed at [facility], [place] [address], [place], [place] [address]    Report Status PENDING  Incomplete  Culture, blood (routine x 2)     Status: None (Preliminary result)   Collection Time: 05/13/20 11:13 AM   Specimen: BLOOD LEFT HAND  Result Value Ref Range Status    Specimen Description BLOOD LEFT HAND  Final   Special Requests   Final    BOTTLES DRAWN AEROBIC AND ANAEROBIC Blood Culture adequate volume   Culture   Final    NO GROWTH 1 DAY Performed at [facility], [place] [address] [place], [place] [address]    Report Status PENDING  Incomplete    Radiology Reports CT ANGIO CHEST PE W OR WO CONTRAST  Result Date: 05/14/2020 CLINICAL DATA:  High probability sepsis due to pneumonia EXAM: CT ANGIOGRAPHY CHEST WITH CONTRAST TECHNIQUE: Multidetector CT imaging of the chest was performed using the standard protocol during bolus administration of intravenous contrast. Multiplanar CT image reconstructions and MIPs were obtained to evaluate the vascular anatomy. CONTRAST:  21mL OMNIPAQUE IOHEXOL 350 MG/ML SOLN COMPARISON:  04/02/2020 FINDINGS: Cardiovascular: Satisfactory opacification by motion of pulmonary artifact. Arteries although limited there is a convincing branching pulmonary artery filling defect affecting segmental vessels in the basal right lower lobe as marked on series 6. Additional peripheral emboli could be present and obscured by the degree of motion artifact. No central embolism is seen. No evidence of right heart strain. Heart size is normal. No pericardial effusion. Atherosclerosis of the coronaries. Mediastinum/Nodes: Negative for adenopathy or mass. Lungs/Pleura: Chronic lung disease with reticulation. Bilateral pleural plaque appearance without calcification. There is increased ground-glass density scattered in the bilateral lungs, greatest in the right upper lobe. No interlobular septal thickening or pleural fluid to implicate failure. Mild centrilobular emphysema. Upper Abdomen: Negative Musculoskeletal: No unexpected finding Review of the MIP images confirms the above findings. Critical Value/emergent results were called by  telephone at the time of interpretation on 05/14/2020 at 8:35 am to provider Charlii Yost, who verbally acknowledged these  results. IMPRESSION: 1. Positive for segmental pulmonary embolism in the right lower lobe. Additional pulmonary emboli could be obscured by the degree of motion artifact. 2. Background of chronic opacities related to recent COVID infection. There is also new ground-glass opacity especially in the right upper lobe suggesting a superimposed acute pneumonia. 3. Aortic Atherosclerosis (ICD10-I70.0).  Coronary atherosclerosis. Electronically Signed   By: Monte Fantasia M.D.   On: 05/14/2020 08:35   MR Lumbar Spine W Wo Contrast  Result Date: 05/13/2020 CLINICAL DATA:  Fever, lumbar spine tenderness EXAM: MRI LUMBAR SPINE WITHOUT AND WITH CONTRAST TECHNIQUE: Multiplanar and multiecho pulse sequences of the lumbar spine were obtained without and with intravenous contrast. CONTRAST:  42mL GADAVIST GADOBUTROL 1 MMOL/ML IV SOLN COMPARISON:  04/03/2018 FINDINGS: Segmentation:  Standard. Alignment:  Stable. Vertebrae: There is now fusion across the L1-L2 disc space. There is no new marrow edema or enhancement. Multilevel degenerative plate irregularity and Schmorl's nodes. Conus medullaris and cauda equina: Conus extends to the L1-L2 level. Conus and cauda equina appear normal. No intrathecal enhancement. Paraspinal and other soft tissues: Mild lower lumbar paraspinal edema. No evidence of collection. Disc levels: L1-L2: Prior right laminectomy. Stable retrolisthesis with bridging endplate osteophytes effacing the ventral thecal sac. Similar mild to moderate foraminal stenosis. L2-L3: Disc bulge. Facet arthropathy with ligamentum flavum infolding. Slightly increased mild canal stenosis. Similar mild foraminal stenosis. L3-L4: Disc bulge. Facet arthropathy with ligamentum flavum infolding. Similar moderate canal stenosis with effacement of subarticular recesses. Similar mild to moderate left greater than right foraminal stenosis. L4-L5: Disc bulge with superimposed left subarticular/foraminal protrusion. Facet arthropathy  with ligamentum flavum infolding. Similar mild canal stenosis. Similar partial effacement of the left subarticular recess. Similar mild foraminal stenosis. L5-S1: Disc bulge with endplate osteophytic ridging. Facet arthropathy. No canal stenosis. Similar mild foraminal stenosis. IMPRESSION: No evidence of discitis/osteomyelitis. Multilevel degenerative changes as detailed above without substantial change since the prior examination. Electronically Signed   By: Macy Mis M.D.   On: 05/13/2020 14:25   VAS [place] IVC/ILIAC (VENOUS ONLY)  Result Date: 05/11/2020 IVC/ILIAC STUDY Indications: DVT, recent COVID Limitations: Air/bowel gas.  Comparison Study: No prior study Performing Technologist: Maudry Mayhew MHA, RDMS, RVT, RDCS  Examination Guidelines: A complete evaluation includes B-mode imaging, spectral Doppler, color Doppler, and power Doppler as needed of all accessible portions of each vessel. Bilateral testing is considered an integral part of a complete examination. Limited examinations for reoccurring indications may be performed as noted.  IVC/Iliac Findings: +----------+------+--------+--------+    IVC    PatentThrombusComments +----------+------+--------+--------+ IVC Mid   patent                 +----------+------+--------+--------+ IVC Distalpatent                 +----------+------+--------+--------+  +-------------------+---------+-----------+---------+-----------+--------+         CIV        RT-PatentRT-ThrombusLT-PatentLT-ThrombusComments +-------------------+---------+-----------+---------+-----------+--------+ Common Iliac Prox                       patent                      +-------------------+---------+-----------+---------+-----------+--------+ Common Iliac Mid                        patent                      +-------------------+---------+-----------+---------+-----------+--------+  Common Iliac Distal patent              patent                       +-------------------+---------+-----------+---------+-----------+--------+  +-------------------------+---------+-----------+---------+-----------+--------+            EIV           RT-PatentRT-ThrombusLT-PatentLT-ThrombusComments +-------------------------+---------+-----------+---------+-----------+--------+ External Iliac Vein Prox                      patent                      +-------------------------+---------+-----------+---------+-----------+--------+ External Iliac Vein       patent                                          Distal                                                                    +-------------------------+---------+-----------+---------+-----------+--------+   Summary: IVC/Iliac: No evidence of thrombus in IVC and Iliac veins.  *See table(s) above for measurements and observations.  Electronically signed by Harold Barban MD on 05/11/2020 at 9:55:50 PM.    Final    DG Chest Port 1 View  Result Date: 05/10/2020 CLINICAL DATA:  Short of breath, history of COPD EXAM: PORTABLE CHEST 1 VIEW COMPARISON:  Prior chest x-ray dated yesterday FINDINGS: Stable cardiac and mediastinal contours. Marked interval increase in diffuse interstitial prominence bilaterally now with Kerley B-lines in the periphery. Findings are most consistent with worsening CHF. No large effusion or pneumothorax. No acute osseous abnormality. IMPRESSION: Increasing interstitial pulmonary edema concerning for progressive CHF. Electronically Signed   By: Jacqulynn Cadet M.D.   On: 05/10/2020 07:54   DG Chest Port 1 View  Result Date: 05/09/2020 CLINICAL DATA:  Possible sepsis Fever and cough EXAM: PORTABLE CHEST 1 VIEW COMPARISON:  04/04/2020 FINDINGS: Heart size within normal limits. No pulmonary vascular congestion. Interval improvement in aeration of the lungs with bilateral airspace opacities still remaining. IMPRESSION: Interval improvement in aeration of the lungs with bilateral  opacities still remaining consistent with resolving pneumonia. Electronically Signed   By: Miachel Roux M.D.   On: 05/09/2020 12:56   VAS [place] LOWER EXTREMITY VENOUS (DVT)  Result Date: 05/11/2020  Lower Venous DVT Study Indications: Edema, Recent Covid infection, and SOB.  Comparison Study: 03/31/20 negative bilateral lower extremity venous duplex Performing Technologist: Maudry Mayhew MHA, RDMS, RVT, RDCS  Examination Guidelines: A complete evaluation includes B-mode imaging, spectral Doppler, color Doppler, and power Doppler as needed of all accessible portions of each vessel. Bilateral testing is considered an integral part of a complete examination. Limited examinations for reoccurring indications may be performed as noted. The reflux portion of the exam is performed with the patient in reverse Trendelenburg.  +---------+---------------+---------+-----------+----------+--------------+ RIGHT    CompressibilityPhasicitySpontaneityPropertiesThrombus Aging +---------+---------------+---------+-----------+----------+--------------+ CFV      Full           Yes      Yes                                 +---------+---------------+---------+-----------+----------+--------------+  SFJ      Full                                                        +---------+---------------+---------+-----------+----------+--------------+ FV Prox  Full                                                        +---------+---------------+---------+-----------+----------+--------------+ FV Mid   Full                                                        +---------+---------------+---------+-----------+----------+--------------+ FV DistalFull                                                        +---------+---------------+---------+-----------+----------+--------------+ PFV      Full                                                         +---------+---------------+---------+-----------+----------+--------------+ POP      Full           Yes      Yes                                 +---------+---------------+---------+-----------+----------+--------------+ PTV      Full                                                        +---------+---------------+---------+-----------+----------+--------------+ PERO     Full                                                        +---------+---------------+---------+-----------+----------+--------------+ Gastroc  None                    No                   Acute          +---------+---------------+---------+-----------+----------+--------------+   +---------+---------------+---------+-----------+----------+--------------+ LEFT     CompressibilityPhasicitySpontaneityPropertiesThrombus Aging +---------+---------------+---------+-----------+----------+--------------+ CFV      Full           Yes      Yes                                 +---------+---------------+---------+-----------+----------+--------------+  SFJ      Full                                                        +---------+---------------+---------+-----------+----------+--------------+ FV Prox  None                    No                   Acute          +---------+---------------+---------+-----------+----------+--------------+ FV Mid   None                    No                   Acute          +---------+---------------+---------+-----------+----------+--------------+ FV DistalNone                    No                   Acute          +---------+---------------+---------+-----------+----------+--------------+ PFV      Full                                                        +---------+---------------+---------+-----------+----------+--------------+ POP      None                    No                   Acute           +---------+---------------+---------+-----------+----------+--------------+ PTV      None                    No                   Acute          +---------+---------------+---------+-----------+----------+--------------+ PERO     None                    No                   Acute          +---------+---------------+---------+-----------+----------+--------------+     Summary: RIGHT: - Findings consistent with acute deep vein thrombosis involving the right gastrocnemius veins. - No cystic structure found in the popliteal fossa.  LEFT: - Findings consistent with acute deep vein thrombosis involving the left femoral vein, left popliteal vein, left posterior tibial veins, and left peroneal veins. - No cystic structure found in the popliteal fossa.  *See table(s) above for measurements and observations. Electronically signed by Harold Barban MD on 05/11/2020 at 9:56:21 PM.    Final

## 2020-05-14 NOTE — Progress Notes (Signed)
Pharmacy Antibiotic Note  Tyrone Roman is a 75 y.o. male admitted on 05/09/2020 with sepsis, now with new fevers and chills. with  Pharmacy has been consulted for vancomycin dosing.  Plan: Vancomycin 1500 mg IV q24h  Height: 6\' 2"  (188 cm) Weight: 88 kg (194 lb 0.1 oz) IBW/kg (Calculated) : 82.2  Temp (24hrs), Avg:99.6 F (37.6 C), Min:97.9 F (36.6 C), Max:102.4 F (39.1 C)  Recent Labs  Lab 05/09/20 1212 05/09/20 1621 05/10/20 0106 05/11/20 0131 05/12/20 0300 05/13/20 0135  WBC 8.3  --  6.1 3.9* 7.7 8.1  CREATININE 1.48*  --  0.98 1.23 1.22 1.29*  LATICACIDVEN 3.3* 1.6  --   --   --   --     Estimated Creatinine Clearance: 58.4 mL/min (A) (by C-G formula based on SCr of 1.29 mg/dL (H)).    Allergies  Allergen Reactions  . Cephalexin Shortness Of Breath, Rash and Other (See Comments)    Tolerated Augmentin 2015 and 2017 (12-2017). Tolerated nafcillin 12/2017 PATIENT HAS HAD A PCN REACTION WITH IMMEDIATE RASH, FACIAL/TONGUE/THROAT SWELLING, SOB, OR LIGHTHEADEDNESS WITH HYPOTENSION:  #  #  YES  #  #  Has patient had a PCN reaction causing severe rash involving mucus membranes or skin necrosis: No Has patient had a PCN reaction that required hospitalization: No Has patient had a PCN reaction occurring within the last 10 years: No If all of the above answers are "NO", then may proceed  . Clarithromycin Shortness Of Breath and Rash  . Certolizumab Pegol Hives  . Tocilizumab Hives  . Doxycycline Rash  . Hydroxyzine Rash  . Lidoderm Other (See Comments)    "made me act weird"  . Pyrithione Zinc Rash    Antimicrobials this admission: Vancomycin 4/4 >> 4/6, 4/9>> Zosyn 4/5 >>   Phillis Knack, PharmD, BCPS  05/14/2020 12:31 AM

## 2020-05-15 ENCOUNTER — Inpatient Hospital Stay (HOSPITAL_COMMUNITY): Payer: Medicare Other

## 2020-05-15 DIAGNOSIS — J449 Chronic obstructive pulmonary disease, unspecified: Secondary | ICD-10-CM | POA: Diagnosis not present

## 2020-05-15 DIAGNOSIS — R053 Chronic cough: Secondary | ICD-10-CM | POA: Diagnosis not present

## 2020-05-15 DIAGNOSIS — R509 Fever, unspecified: Secondary | ICD-10-CM

## 2020-05-15 DIAGNOSIS — A419 Sepsis, unspecified organism: Secondary | ICD-10-CM | POA: Diagnosis not present

## 2020-05-15 DIAGNOSIS — J189 Pneumonia, unspecified organism: Secondary | ICD-10-CM | POA: Diagnosis not present

## 2020-05-15 LAB — HEPATITIS PANEL, ACUTE
HCV Ab: NONREACTIVE
Hep A IgM: NONREACTIVE
Hep B C IgM: NONREACTIVE
Hepatitis B Surface Ag: NONREACTIVE

## 2020-05-15 LAB — EPSTEIN-BARR VIRUS (EBV) ANTIBODY PROFILE
EBV NA IgG: 86.3 U/mL — ABNORMAL HIGH (ref 0.0–17.9)
EBV VCA IgG: >600 U/mL — ABNORMAL HIGH (ref 0.0–17.9)
EBV VCA IgM: <36 U/mL (ref 0.0–35.9)

## 2020-05-15 LAB — CBC
HCT: 33.8 % — ABNORMAL LOW (ref 39.0–52.0)
Hemoglobin: 10.5 g/dL — ABNORMAL LOW (ref 13.0–17.0)
MCH: 28.7 pg (ref 26.0–34.0)
MCHC: 31.1 g/dL (ref 30.0–36.0)
MCV: 92.3 fL (ref 80.0–100.0)
Platelets: 275 10*3/uL (ref 150–400)
RBC: 3.66 MIL/uL — ABNORMAL LOW (ref 4.22–5.81)
RDW: 19.2 % — ABNORMAL HIGH (ref 11.5–15.5)
WBC: 7.9 10*3/uL (ref 4.0–10.5)
nRBC: 0 % (ref 0.0–0.2)

## 2020-05-15 LAB — COMPREHENSIVE METABOLIC PANEL
ALT: 17 U/L (ref 0–44)
AST: 39 U/L (ref 15–41)
Albumin: 2.7 g/dL — ABNORMAL LOW (ref 3.5–5.0)
Alkaline Phosphatase: 70 U/L (ref 38–126)
Anion gap: 10 (ref 5–15)
BUN: 14 mg/dL (ref 8–23)
CO2: 23 mmol/L (ref 22–32)
Calcium: 9.7 mg/dL (ref 8.9–10.3)
Chloride: 104 mmol/L (ref 98–111)
Creatinine, Ser: 1.01 mg/dL (ref 0.61–1.24)
GFR, Estimated: >60 mL/min (ref >60)
Glucose, Bld: 103 mg/dL — ABNORMAL HIGH (ref 70–99)
Potassium: 4.1 mmol/L (ref 3.5–5.1)
Sodium: 137 mmol/L (ref 135–145)
Total Bilirubin: 1.3 mg/dL — ABNORMAL HIGH (ref 0.3–1.2)
Total Protein: 5.5 g/dL — ABNORMAL LOW (ref 6.5–8.1)

## 2020-05-15 LAB — CMV ANTIBODY, IGG (EIA): CMV Ab - IgG: <0.6 U/mL (ref 0.00–0.59)

## 2020-05-15 LAB — PROCALCITONIN: Procalcitonin: <0.1 ng/mL

## 2020-05-15 LAB — GLUCOSE, CAPILLARY
Glucose-Capillary: 99 mg/dL (ref 70–99)
Glucose-Capillary: 142 mg/dL — ABNORMAL HIGH (ref 70–99)
Glucose-Capillary: 163 mg/dL — ABNORMAL HIGH (ref 70–99)
Glucose-Capillary: 197 mg/dL — ABNORMAL HIGH (ref 70–99)

## 2020-05-15 LAB — ACTH STIMULATION, 3 TIME POINTS
Cortisol, 30 Min: 29.4 ug/dL
Cortisol, 60 Min: 33.1 ug/dL
Cortisol, Base: 19.7 ug/dL

## 2020-05-15 LAB — C-REACTIVE PROTEIN: CRP: 18 mg/dL — ABNORMAL HIGH (ref <1.0)

## 2020-05-15 LAB — RPR: RPR Ser Ql: NONREACTIVE

## 2020-05-15 LAB — LACTATE DEHYDROGENASE: LDH: 360 U/L — ABNORMAL HIGH (ref 98–192)

## 2020-05-15 LAB — CMV IGM: CMV IgM: <30 AU/mL (ref 0.0–29.9)

## 2020-05-15 LAB — SEDIMENTATION RATE: Sed Rate: 58 mm/hr — ABNORMAL HIGH (ref 0–16)

## 2020-05-15 MED ORDER — HYDROCORTISONE NA SUCCINATE PF 100 MG IJ SOLR: 100.0000 mg | Freq: Three times a day (TID) | INTRAMUSCULAR | Status: DC

## 2020-05-15 MED ORDER — IOHEXOL 300 MG/ML  SOLN
100.0000 mL | Freq: Once | INTRAMUSCULAR | Status: AC | PRN
  Administered 2020-05-15: 100 mL via INTRAVENOUS

## 2020-05-15 MED ORDER — IOHEXOL 12 MG/ML PO SOLN
500.0000 mL | ORAL | Status: AC
  Administered 2020-05-15 (×2): 500 mL via ORAL

## 2020-05-15 MED ORDER — HYDROCORTISONE NA SUCCINATE PF 100 MG IJ SOLR
100.0000 mg | INTRAMUSCULAR | Status: AC
  Administered 2020-05-15: 100 mg via INTRAVENOUS
  Filled 2020-05-15: qty 2

## 2020-05-15 NOTE — Progress Notes (Signed)
PROGRESS NOTE                                                                                                                                                                                                             Patient Demographics:    Tyrone Roman, is a 75 y.o. male, DOB - Feb 03, 1946, FWY:[id number]  Outpatient Primary MD for the patient is [name], MD    LOS - 6  Admit date - 05/09/2020    Chief Complaint  Patient presents with  . Tachycardia       Brief Narrative (HPI from H&P)    - Tyrone Roman is a 75 y.o. male with medical history significant of RA; CAD s/p stent; HTN; HLD; COPD; and BPH presenting with tachycardia.  He can't remember what happened, work-up here suggested hypoxia CHF/pneumonia of care, admitted for further work-up, respiratory status improved after initial diuresis, patient kept having episodes of SIRS, fever, tachycardia and hypotension, as well having elevated D-dimers, venous Dopplers was significant for left lower extremity DVT, and CTA chest was significant for PE, and the typical of pneumonia, hospital stay was complicated by fever of unknown origin, ID was consulted.   Subjective:    Tyrone Roman with fever of 101.4 this morning, he remains with poor appetite .   Assessment  & Plan :     Acute Hypoxic Resp. -Multifactorial, some volume overload , as well evidence of PE, and difficult pneumonia. -Encouraged to use  incentive spirometry and flutter valve  SIRS/FUO - patient continues to have intermittent fever, hypotensive episode and he is immunosuppressed due to his medication from rheumatoid arthritis. -So far septic work-up is nonrevealing, fever this morning of 101.4, so far blood cultures are negative, UA remains negative as well . -Covaryx management per ID, vancomycin has been discontinued, continue with Zosyn  -MRI lumbar spine with/without contrast with no evidence  of discitis or infection. -Check cycle threshold Covid PCR, SARS antibody as well  -Further labs were ordered by ID including HIV viral hepatitis panel ANA LDH QuantiFERON gold cryptococcal antigen histoplasma antigen Blastomyces antigen CMV and Epstein-Barr virus titers -We will obtain CT abdomen/pelvis with contrast  Questionable steroid dependency  -Patient on low-dose prednisone for his rheumatoid arthritis, with recent increased dose prednisone due to his recent COVID-19 infection . -He was started on  stress dose steroids initially, he had significant improvement in that, even his fever has resolved, he was tapered off, his fever came back, he was confused his back, he became confused and weaker .  Even though his ACTH stim test is not diagnostic of adrenal insufficiency, will challenge him again with stress dose steroids to see his clinical progression and improvement.   Acute DVT/PE -Venous Doppler significant for left lower extremity extensive acute DVT, currently on Lovenox, will transition to Eliquis in 1 to 2 days  SpO2: 99 % O2 Flow Rate (L/min): 4 L/min FiO2 (%): 28 %  Recent Labs  Lab 05/09/20 1212 05/09/20 1621 05/10/20 0106 05/10/20 0807 05/11/20 0131 05/12/20 0300 05/13/20 0135 05/13/20 0900 05/14/20 0010 05/14/20 0257 05/15/20 0637  WBC 8.3  --    < >  --  3.9* 7.7 8.1  --  5.7  --  7.9  HGB 11.3*  --    < >  --  9.5* 8.9* 9.5*  --  9.0*  --  10.5*  HCT 35.9*  --    < >  --  29.8* 28.0* 29.8*  --  28.2*  --  33.8*  PLT 187  --    < >  --  193 210 248  --  242  --  275  CRP  --   --    < > 11.2* 10.5* 5.6* 3.2*  --  5.7*  --  18.0*  BNP  --   --   --  107.5* 122.6* 98.9 72.6  --  144.3*  --   --   DDIMER  --   --   --  3.48* 2.75* 3.45* 3.76*  --  2.27*  --   --   PROCALCITON <0.10  --   --   --   --   --   --  0.11 <0.10  --  <0.10  AST 44*  --   --   --  25 23 36  --  33  --  39  ALT 22  --   --   --  17 13 20   --  19  --  17  ALKPHOS 101  --   --   --  74  63 68  --  78  --  70  BILITOT 1.5*  --   --   --  0.9 0.4 0.7  --  1.1  --  1.3*  ALBUMIN 2.4*  --   --   --  1.9* 1.9* 2.1*  --  2.9*  --  2.7*  INR 1.2  --   --   --   --   --   --   --   --   --   --   LATICACIDVEN 3.3* 1.6  --   --   --   --   --   --  1.6 1.4  --   SARSCOV2NAA POSITIVE*  --   --   --   --   --   --   --   --   --   --    < > = values in this interval not displayed.     Deconditioning, -Patient has had two recent admissions to the hospital . Supportive care with PT OT. At risk for delirium, minimize narcotics and benzodiazepines.   Metabolic encephalopathy. -Resolved, mentation back to baseline  Acute on Chronic diastolic CHF recent EF 55 to 60%. C  BPH. On Flomax.  Dyslipidemia. On statin continue.  History of Rheumatoid arthritis. Takes Rituxan every 4 months.  HTN  - blood pressure slightly on the lower side with tachycardia, on low-dose beta-blocker  - few months ago he had an episode of adrenal insufficiency, he was treated with stress dose steroids, will discontinue today monitor response  Hyperglycemia -pre Diabetes with A1c of 6.3       Condition - Extremely Guarded  Family Communication  : Discussed with daughter at bedside  Code Status : Full code, confirmed by patient and daughter  Consults  :  Pall.Care  PUD Prophylaxis : PPI   Procedures  :     Leg ultrasound.      Disposition Plan  :    Status is: Inpatient  Remains inpatient appropriate because:IV treatments appropriate due to intensity of illness or inability to take PO   Dispo: The patient is from: SNF              Anticipated d/c is to: SNF              Patient currently is not medically stable to d/c.   Difficult to place patient No   DVT Prophylaxis  :  Lovenox   Lab Results  Component Value Date   PLT 275 05/15/2020    Diet :  Diet Order            Diet Carb Modified Fluid consistency: Thin; Room service appropriate? Yes  Diet effective now                   Inpatient Medications  Scheduled Meds: . aspirin  81 mg Oral Daily  . enoxaparin (LOVENOX) injection  90 mg Subcutaneous Q12H  . insulin aspart  0-15 Units Subcutaneous TID WC  . insulin aspart  0-5 Units Subcutaneous QHS  . lactose free nutrition  237 mL Oral TID WC  . melatonin  5 mg Oral QHS  . metoprolol tartrate  25 mg Oral BID  . mometasone-formoterol  2 puff Inhalation BID  . pantoprazole  40 mg Oral Daily  . simvastatin  20 mg Oral Daily  . sodium chloride flush  3 mL Intravenous Q12H  . tamsulosin  0.4 mg Oral Daily  . umeclidinium bromide  1 puff Inhalation Daily   Continuous Infusions: . sodium chloride    . lactated ringers 100 mL/hr at 05/15/20 0524  . piperacillin-tazobactam (ZOSYN)  IV 3.375 g (05/15/20 0838)   PRN Meds:.sodium chloride, acetaminophen, acetaminophen **OR** [DISCONTINUED] acetaminophen, albuterol, lip balm, metoprolol tartrate, [DISCONTINUED] ondansetron **OR** ondansetron (ZOFRAN) IV  Antibiotics  :    Anti-infectives (From admission, onward)   Start     Dose/Rate Route Frequency Ordered Stop   05/15/20 2000  vancomycin (VANCOREADY) IVPB 1500 mg/300 mL  Status:  Discontinued        1,500 mg 150 mL/hr over 120 Minutes Intravenous Every 24 hours 05/14/20 0034 05/14/20 1109   05/14/20 1100  levofloxacin (LEVAQUIN) tablet 750 mg  Status:  Discontinued        750 mg Oral Daily 05/14/20 0902 05/14/20 1109   05/14/20 0130  vancomycin (VANCOREADY) IVPB 1500 mg/300 mL        1,500 mg 150 mL/hr over 120 Minutes Intravenous  Once 05/14/20 0034 05/14/20 0458   05/10/20 1700  vancomycin (VANCOREADY) IVPB 1500 mg/300 mL  Status:  Discontinued        1,500 mg 150 mL/hr over 120 Minutes Intravenous Every 24 hours 05/10/20 1613 05/12/20 1041  05/10/20 1700  piperacillin-tazobactam (ZOSYN) IVPB 3.375 g        3.375 g 12.5 mL/hr over 240 Minutes Intravenous Every 8 hours 05/10/20 1613     05/10/20 1400  ceFEPIme (MAXIPIME) 2 g in sodium chloride  0.9 % 100 mL IVPB  Status:  Discontinued        2 g 200 mL/hr over 30 Minutes Intravenous Every 8 hours 05/10/20 1002 05/10/20 1108   05/10/20 1330  vancomycin (VANCOREADY) IVPB 1500 mg/300 mL  Status:  Discontinued        1,500 mg 150 mL/hr over 120 Minutes Intravenous Every 24 hours 05/09/20 1355 05/10/20 1108   05/10/20 1215  levofloxacin (LEVAQUIN) tablet 750 mg  Status:  Discontinued        750 mg Oral Daily 05/10/20 1115 05/10/20 2003   05/10/20 0100  ceFEPIme (MAXIPIME) 2 g in sodium chloride 0.9 % 100 mL IVPB  Status:  Discontinued        2 g 200 mL/hr over 30 Minutes Intravenous Every 12 hours 05/09/20 1355 05/10/20 1002   05/09/20 1230  ceFEPIme (MAXIPIME) 2 g in sodium chloride 0.9 % 100 mL IVPB        2 g 200 mL/hr over 30 Minutes Intravenous  Once 05/09/20 1227 05/09/20 1324   05/09/20 1230  vancomycin (VANCOREADY) IVPB 2000 mg/400 mL        2,000 mg 200 mL/hr over 120 Minutes Intravenous  Once 05/09/20 1228 05/09/20 1546       Time Spent in minutes  30   Emeline Gins Glorious Flicker M.D on 05/15/2020 at 12:20 PM  To page go to [email or web address]   [facility] -  Office  [phone]    See all Orders from today for further details    Objective:   Vitals:   05/15/20 0736 05/15/20 0830 05/15/20 1000 05/15/20 1142  BP: 107/85 (!) 148/91  119/71  Pulse: (!) 124   84  Resp: 18   18  Temp: (!) 101.4 F (38.6 C) (!) 101 F (38.3 C) 99.6 F (37.6 C) (!) 97.5 F (36.4 C)  TempSrc: Axillary Axillary  Oral  SpO2: 97% 96%  99%  Weight:      Height:        Wt Readings from Last 3 Encounters:  05/09/20 88 kg  05/05/20 88.5 kg  04/26/20 88.5 kg     Intake/Output Summary (Last 24 hours) at 05/15/2020 1220 Last data filed at 05/15/2020 0600 Gross per 24 hour  Intake 1237.13 ml  Output 1500 ml  Net -262.87 ml     Physical Exam  Awake Alert, more coherent today, but confused, extremely frail,  Symmetrical Chest wall movement, Good air movement bilaterally,  CTAB RRR,No Gallops,Rubs or new Murmurs, No Parasternal Heave +ve B.Sounds, Abd Soft, No tenderness, No rebound - guarding or rigidity. No Cyanosis, Clubbing or edema, No new Rash or bruise         Data Review:    CBC Recent Labs  Lab 05/09/20 1212 05/10/20 0106 05/11/20 0131 05/12/20 0300 05/13/20 0135 05/14/20 0010 05/15/20 0637  WBC 8.3   < > 3.9* 7.7 8.1 5.7 7.9  HGB 11.3*   < > 9.5* 8.9* 9.5* 9.0* 10.5*  HCT 35.9*   < > 29.8* 28.0* 29.8* 28.2* 33.8*  PLT 187   < > 193 210 248 242 275  MCV 89.5   < > 87.9 87.8 87.9 89.8 92.3  MCH 28.2   < > 28.0 27.9 28.0 28.7 28.7  MCHC 31.5   < > 31.9 31.8 31.9 31.9 31.1  RDW 18.2*   < > 18.0* 18.0* 18.3* 18.8* 19.2*  LYMPHSABS 0.4*  --  0.4* 0.5* 0.6* 0.4*  --   MONOABS 0.5  --  0.1 0.6 0.5 0.3  --   EOSABS 0.2  --  0.0 0.0 0.2 0.3  --   BASOSABS 0.0  --  0.0 0.0 0.0 0.0  --    < > = values in this interval not displayed.    Recent Labs  Lab 05/09/20 1212 05/09/20 1621 05/10/20 0106 05/10/20 0807 05/11/20 0131 05/12/20 0300 05/12/20 0741 05/13/20 0135 05/13/20 0900 05/14/20 0010 05/14/20 0257 05/15/20 0637  NA 136  --    < >  --  136 133*  --  139  --  139  --  137  K 4.4  --    < >  --  3.8 3.0*  --  3.0*  --  3.9  --  4.1  CL 101  --    < >  --  103 102  --  104  --  108  --  104  CO2 22  --    < >  --  24 22  --  24  --  23  --  23  GLUCOSE 128*  --    < >  --  177* 292*  --  111*  --  119*  --  103*  BUN 19  --    < >  --  17 23  --  23  --  16  --  14  CREATININE 1.48*  --    < >  --  1.23 1.22  --  1.29*  --  1.31*  --  1.01  CALCIUM 9.8  --    < >  --  9.3 9.0  --  9.4  --  9.7  --  9.7  AST 44*  --   --   --  25 23  --  36  --  33  --  39  ALT 22  --   --   --  17 13  --  20  --  19  --  17  ALKPHOS 101  --   --   --  74 63  --  68  --  78  --  70  BILITOT 1.5*  --   --   --  0.9 0.4  --  0.7  --  1.1  --  1.3*  ALBUMIN 2.4*  --   --   --  1.9* 1.9*  --  2.1*  --  2.9*  --  2.7*  MG  --   --   --  1.4*  1.8 1.7  --  1.6*  --  1.8  --   --   CRP  --   --    < > 11.2* 10.5* 5.6*  --  3.2*  --  5.7*  --  18.0*  DDIMER  --   --   --  3.48* 2.75* 3.45*  --  3.76*  --  2.27*  --   --   PROCALCITON <0.10  --   --   --   --   --   --   --  0.11 <0.10  --  <0.10  LATICACIDVEN 3.3* 1.6  --   --   --   --   --   --   --  1.6 1.4  --   INR 1.2  --   --   --   --   --   --   --   --   --   --   --   TSH  --   --   --  1.040  --   --   --   --   --   --   --   --   HGBA1C  --   --   --   --   --   --  6.3*  --   --   --   --   --   BNP  --   --   --  107.5* 122.6* 98.9  --  72.6  --  144.3*  --   --    < > = values in this interval not displayed.    ------------------------------------------------------------------------------------------------------------------ No results for input(s): CHOL, HDL, LDLCALC, TRIG, CHOLHDL, LDLDIRECT in the last 72 hours.  Lab Results  Component Value Date   HGBA1C 6.3 (H) 05/12/2020   ------------------------------------------------------------------------------------------------------------------ No results for input(s): TSH, T4TOTAL, T3FREE, THYROIDAB in the last 72 hours.  Invalid input(s): FREET3  Cardiac Enzymes No results for input(s): CKMB, TROPONINI, MYOGLOBIN in the last 168 hours.  Invalid input(s): CK ------------------------------------------------------------------------------------------------------------------    Component Value Date/Time   BNP 144.3 (H) 05/14/2020 0010    Micro Results Recent Results (from the past 240 hour(s))  Urine culture     Status: None   Collection Time: 05/09/20  2:07 AM   Specimen: In/Out Cath Urine  Result Value Ref Range Status   Specimen Description IN/OUT CATH URINE  Final   Special Requests NONE  Final   Culture   Final    NO GROWTH Performed at [facility], [address], [place], [place] [address]    Report Status 05/11/2020 FINAL  Final  Resp Panel by RT-PCR (Flu A&B, Covid) Nasopharyngeal Swab      Status: Abnormal   Collection Time: 05/09/20 12:12 PM   Specimen: Nasopharyngeal Swab; Nasopharyngeal(NP) swabs in vial transport medium  Result Value Ref Range Status   SARS Coronavirus 2 by RT PCR POSITIVE (A) NEGATIVE Final    Comment: RESULT CALLED TO, READ BACK BY AND VERIFIED WITH: RN P [facility] [id number] AT 8657 BY CM (NOTE) SARS-CoV-2 target nucleic acids are DETECTED.  The SARS-CoV-2 RNA is generally detectable in upper respiratory specimens during the acute phase of infection. Positive results are indicative of the presence of the identified virus, but do not rule out bacterial infection or co-infection with other pathogens not detected by the test. Clinical correlation with patient history and other diagnostic information is necessary to determine patient infection status. The expected result is Negative.  Fact Sheet for Patients: EntrepreneurPulse.com.au  Fact Sheet for Healthcare Providers: IncredibleEmployment.be  This test is not yet approved or cleared by the [place] FDA and  has been authorized for detection and/or diagnosis of SARS-CoV-2 by FDA under an Emergency Use Authorization (EUA).  This EUA will remain in effect (meaning this test can be u sed) for the duration of  the COVID-19 declaration under Section 564(b)(1) of the Act, 21 U.S.C. section 360bbb-3(b)(1), unless the authorization is terminated or revoked sooner.     Influenza A by PCR NEGATIVE NEGATIVE Final   Influenza B by PCR NEGATIVE NEGATIVE Final    Comment: (NOTE) The Xpert Xpress SARS-CoV-2/FLU/RSV plus assay is intended as an aid in the diagnosis of  influenza from Nasopharyngeal swab specimens and should not be used as a sole basis for treatment. Nasal washings and aspirates are unacceptable for Xpert Xpress SARS-CoV-2/FLU/RSV testing.  Fact Sheet for Patients: EntrepreneurPulse.com.au  Fact Sheet for Healthcare  Providers: IncredibleEmployment.be  This test is not yet approved or cleared by the [place] FDA and has been authorized for detection and/or diagnosis of SARS-CoV-2 by FDA under an Emergency Use Authorization (EUA). This EUA will remain in effect (meaning this test can be used) for the duration of the COVID-19 declaration under Section 564(b)(1) of the Act, 21 U.S.C. section 360bbb-3(b)(1), unless the authorization is terminated or revoked.  Performed at [facility], [place] [address], [place], [place] [address]   Blood Culture (routine x 2)     Status: None   Collection Time: 05/09/20 12:12 PM   Specimen: BLOOD  Result Value Ref Range Status   Specimen Description BLOOD SITE NOT SPECIFIED  Final   Special Requests   Final    BOTTLES DRAWN AEROBIC AND ANAEROBIC Blood Culture adequate volume   Culture   Final    NO GROWTH 5 DAYS Performed at [facility], [address] [place], [place] [address]    Report Status 05/14/2020 FINAL  Final  Blood Culture (routine x 2)     Status: None   Collection Time: 05/09/20 12:12 PM   Specimen: BLOOD  Result Value Ref Range Status   Specimen Description BLOOD BLOOD LEFT HAND  Final   Special Requests   Final    BOTTLES DRAWN AEROBIC AND ANAEROBIC Blood Culture adequate volume   Culture   Final    NO GROWTH 5 DAYS Performed at [facility], [place] [address] [place], [place] [address]    Report Status 05/14/2020 FINAL  Final  MRSA PCR Screening     Status: Abnormal   Collection Time: 05/09/20 12:30 PM   Specimen: Nasal Mucosa; Nasopharyngeal  Result Value Ref Range Status   MRSA by PCR POSITIVE (A) NEGATIVE Final    Comment:        The GeneXpert MRSA Assay (FDA approved for NASAL specimens only), is one component of a comprehensive MRSA colonization surveillance program. It is not intended to diagnose MRSA infection nor to guide or monitor treatment for MRSA infections. RESULT CALLED TO, READ  BACK BY AND VERIFIED WITH: IDOL,K RN AT 2212 05/09/2020 MITCHELL,L Performed at [facility], [place] [address], [place], [place] [address]   Culture, blood (routine x 2)     Status: None (Preliminary result)   Collection Time: 05/13/20 11:07 AM   Specimen: BLOOD RIGHT HAND  Result Value Ref Range Status   Specimen Description BLOOD RIGHT HAND  Final   Special Requests   Final    BOTTLES DRAWN AEROBIC AND ANAEROBIC Blood Culture adequate volume   Culture   Final    NO GROWTH 2 DAYS Performed at [facility], [place] [address] [place], [place] [address]    Report Status PENDING  Incomplete  Culture, blood (routine x 2)     Status: None (Preliminary result)   Collection Time: 05/13/20 11:13 AM   Specimen: BLOOD LEFT HAND  Result Value Ref Range Status   Specimen Description BLOOD LEFT HAND  Final   Special Requests   Final    BOTTLES DRAWN AEROBIC AND ANAEROBIC Blood Culture adequate volume   Culture   Final    NO GROWTH 2 DAYS Performed at [facility], [address] [place], [place] [address]    Report Status PENDING  Incomplete    Radiology Reports CT ANGIO CHEST PE W OR WO CONTRAST  Result Date: 05/14/2020 CLINICAL DATA:  High probability sepsis due to pneumonia EXAM: CT ANGIOGRAPHY CHEST WITH CONTRAST TECHNIQUE: Multidetector CT imaging of the chest was performed using the standard protocol during bolus administration of intravenous contrast. Multiplanar CT image reconstructions and MIPs were obtained to evaluate the vascular anatomy. CONTRAST:  63mL OMNIPAQUE IOHEXOL 350 MG/ML SOLN COMPARISON:  04/02/2020 FINDINGS: Cardiovascular: Satisfactory opacification by motion of pulmonary artifact. Arteries although limited there is a convincing branching pulmonary artery filling defect affecting segmental vessels in the basal right lower lobe as marked on series 6. Additional peripheral emboli could be present and obscured by the degree of motion artifact. No central embolism  is seen. No evidence of right heart strain. Heart size is normal. No pericardial effusion. Atherosclerosis of the coronaries. Mediastinum/Nodes: Negative for adenopathy or mass. Lungs/Pleura: Chronic lung disease with reticulation. Bilateral pleural plaque appearance without calcification. There is increased ground-glass density scattered in the bilateral lungs, greatest in the right upper lobe. No interlobular septal thickening or pleural fluid to implicate failure. Mild centrilobular emphysema. Upper Abdomen: Negative Musculoskeletal: No unexpected finding Review of the MIP images confirms the above findings. Critical Value/emergent results were called by telephone at the time of interpretation on 05/14/2020 at 8:35 am to provider Caela Huot, who verbally acknowledged these results. IMPRESSION: 1. Positive for segmental pulmonary embolism in the right lower lobe. Additional pulmonary emboli could be obscured by the degree of motion artifact. 2. Background of chronic opacities related to recent COVID infection. There is also new ground-glass opacity especially in the right upper lobe suggesting a superimposed acute pneumonia. 3. Aortic Atherosclerosis (ICD10-I70.0).  Coronary atherosclerosis. Electronically Signed   By: Monte Fantasia M.D.   On: 05/14/2020 08:35   MR Lumbar Spine W Wo Contrast  Result Date: 05/13/2020 CLINICAL DATA:  Fever, lumbar spine tenderness EXAM: MRI LUMBAR SPINE WITHOUT AND WITH CONTRAST TECHNIQUE: Multiplanar and multiecho pulse sequences of the lumbar spine were obtained without and with intravenous contrast. CONTRAST:  29mL GADAVIST GADOBUTROL 1 MMOL/ML IV SOLN COMPARISON:  04/03/2018 FINDINGS: Segmentation:  Standard. Alignment:  Stable. Vertebrae: There is now fusion across the L1-L2 disc space. There is no new marrow edema or enhancement. Multilevel degenerative plate irregularity and Schmorl's nodes. Conus medullaris and cauda equina: Conus extends to the L1-L2 level. Conus and cauda  equina appear normal. No intrathecal enhancement. Paraspinal and other soft tissues: Mild lower lumbar paraspinal edema. No evidence of collection. Disc levels: L1-L2: Prior right laminectomy. Stable retrolisthesis with bridging endplate osteophytes effacing the ventral thecal sac. Similar mild to moderate foraminal stenosis. L2-L3: Disc bulge. Facet arthropathy with ligamentum flavum infolding. Slightly increased mild canal stenosis. Similar mild foraminal stenosis. L3-L4: Disc bulge. Facet arthropathy with ligamentum flavum infolding. Similar moderate canal stenosis with effacement of subarticular recesses. Similar mild to moderate left greater than right foraminal stenosis. L4-L5: Disc bulge with superimposed left subarticular/foraminal protrusion. Facet arthropathy with ligamentum flavum infolding. Similar mild canal stenosis. Similar partial effacement of the left subarticular recess. Similar mild foraminal stenosis. L5-S1: Disc bulge with endplate osteophytic ridging. Facet arthropathy. No canal stenosis. Similar mild foraminal stenosis. IMPRESSION: No evidence of discitis/osteomyelitis. Multilevel degenerative changes as detailed above without substantial change since the prior examination. Electronically Signed   By: Macy Mis M.D.   On: 05/13/2020 14:25   VAS [place] IVC/ILIAC (VENOUS ONLY)  Result Date: 05/11/2020 IVC/ILIAC STUDY Indications: DVT, recent COVID Limitations: Air/bowel gas.  Comparison Study: No prior study Performing Technologist: Maudry Mayhew MHA, RDMS, RVT, RDCS  Examination Guidelines: A complete evaluation includes B-mode imaging, spectral Doppler, color Doppler, and power Doppler as needed of all accessible portions of each vessel. Bilateral testing is considered an integral part of a complete examination. Limited examinations for reoccurring indications may be performed as noted.  IVC/Iliac Findings: +----------+------+--------+--------+    IVC    PatentThrombusComments  +----------+------+--------+--------+ IVC Mid   patent                 +----------+------+--------+--------+ IVC Distalpatent                 +----------+------+--------+--------+  +-------------------+---------+-----------+---------+-----------+--------+         CIV        RT-PatentRT-ThrombusLT-PatentLT-ThrombusComments +-------------------+---------+-----------+---------+-----------+--------+ Common Iliac Prox                       patent                      +-------------------+---------+-----------+---------+-----------+--------+ Common Iliac Mid                        patent                      +-------------------+---------+-----------+---------+-----------+--------+ Common Iliac Distal patent              patent                      +-------------------+---------+-----------+---------+-----------+--------+  +-------------------------+---------+-----------+---------+-----------+--------+            EIV           RT-PatentRT-ThrombusLT-PatentLT-ThrombusComments +-------------------------+---------+-----------+---------+-----------+--------+ External Iliac Vein Prox                      patent                      +-------------------------+---------+-----------+---------+-----------+--------+ External Iliac Vein       patent                                          Distal                                                                    +-------------------------+---------+-----------+---------+-----------+--------+   Summary: IVC/Iliac: No evidence of thrombus in IVC and Iliac veins.  *See table(s) above for measurements and observations.  Electronically signed by Harold Barban MD on 05/11/2020 at 9:55:50 PM.    Final    DG Chest Port 1 View  Result Date: 05/10/2020 CLINICAL DATA:  Short of breath, history of COPD EXAM: PORTABLE CHEST 1 VIEW COMPARISON:  Prior chest x-ray dated yesterday FINDINGS: Stable cardiac and mediastinal contours.  Marked interval increase in diffuse interstitial prominence bilaterally now with Kerley B-lines in the periphery. Findings are most consistent with worsening CHF. No large effusion or pneumothorax. No acute osseous abnormality. IMPRESSION: Increasing interstitial pulmonary edema concerning for progressive CHF. Electronically Signed   By:  Jacqulynn Cadet M.D.   On: 05/10/2020 07:54   DG Chest Port 1 View  Result Date: 05/09/2020 CLINICAL DATA:  Possible sepsis Fever and cough EXAM: PORTABLE CHEST 1 VIEW COMPARISON:  04/04/2020 FINDINGS: Heart size within normal limits. No pulmonary vascular congestion. Interval improvement in aeration of the lungs with bilateral airspace opacities still remaining. IMPRESSION: Interval improvement in aeration of the lungs with bilateral opacities still remaining consistent with resolving pneumonia. Electronically Signed   By: Miachel Roux M.D.   On: 05/09/2020 12:56   VAS [place] LOWER EXTREMITY VENOUS (DVT)  Result Date: 05/11/2020  Lower Venous DVT Study Indications: Edema, Recent Covid infection, and SOB.  Comparison Study: 03/31/20 negative bilateral lower extremity venous duplex Performing Technologist: Maudry Mayhew MHA, RDMS, RVT, RDCS  Examination Guidelines: A complete evaluation includes B-mode imaging, spectral Doppler, color Doppler, and power Doppler as needed of all accessible portions of each vessel. Bilateral testing is considered an integral part of a complete examination. Limited examinations for reoccurring indications may be performed as noted. The reflux portion of the exam is performed with the patient in reverse Trendelenburg.  +---------+---------------+---------+-----------+----------+--------------+ RIGHT    CompressibilityPhasicitySpontaneityPropertiesThrombus Aging +---------+---------------+---------+-----------+----------+--------------+ CFV      Full           Yes      Yes                                  +---------+---------------+---------+-----------+----------+--------------+ SFJ      Full                                                        +---------+---------------+---------+-----------+----------+--------------+ FV Prox  Full                                                        +---------+---------------+---------+-----------+----------+--------------+ FV Mid   Full                                                        +---------+---------------+---------+-----------+----------+--------------+ FV DistalFull                                                        +---------+---------------+---------+-----------+----------+--------------+ PFV      Full                                                        +---------+---------------+---------+-----------+----------+--------------+ POP      Full           Yes      Yes                                 +---------+---------------+---------+-----------+----------+--------------+  PTV      Full                                                        +---------+---------------+---------+-----------+----------+--------------+ PERO     Full                                                        +---------+---------------+---------+-----------+----------+--------------+ Gastroc  None                    No                   Acute          +---------+---------------+---------+-----------+----------+--------------+   +---------+---------------+---------+-----------+----------+--------------+ LEFT     CompressibilityPhasicitySpontaneityPropertiesThrombus Aging +---------+---------------+---------+-----------+----------+--------------+ CFV      Full           Yes      Yes                                 +---------+---------------+---------+-----------+----------+--------------+ SFJ      Full                                                         +---------+---------------+---------+-----------+----------+--------------+ FV Prox  None                    No                   Acute          +---------+---------------+---------+-----------+----------+--------------+ FV Mid   None                    No                   Acute          +---------+---------------+---------+-----------+----------+--------------+ FV DistalNone                    No                   Acute          +---------+---------------+---------+-----------+----------+--------------+ PFV      Full                                                        +---------+---------------+---------+-----------+----------+--------------+ POP      None                    No                   Acute          +---------+---------------+---------+-----------+----------+--------------+ PTV      None  No                   Acute          +---------+---------------+---------+-----------+----------+--------------+ PERO     None                    No                   Acute          +---------+---------------+---------+-----------+----------+--------------+     Summary: RIGHT: - Findings consistent with acute deep vein thrombosis involving the right gastrocnemius veins. - No cystic structure found in the popliteal fossa.  LEFT: - Findings consistent with acute deep vein thrombosis involving the left femoral vein, left popliteal vein, left posterior tibial veins, and left peroneal veins. - No cystic structure found in the popliteal fossa.  *See table(s) above for measurements and observations. Electronically signed by Harold Barban MD on 05/11/2020 at 9:56:21 PM.    Final

## 2020-05-15 NOTE — Progress Notes (Signed)
Subjective: Patient is encephalopathic   Antibiotics:  Anti-infectives (From admission, onward)   Start     Dose/Rate Route Frequency Ordered Stop   05/15/20 2000  vancomycin (VANCOREADY) IVPB 1500 mg/300 mL  Status:  Discontinued        1,500 mg 150 mL/hr over 120 Minutes Intravenous Every 24 hours 05/14/20 0034 05/14/20 1109   05/14/20 1100  levofloxacin (LEVAQUIN) tablet 750 mg  Status:  Discontinued        750 mg Oral Daily 05/14/20 0902 05/14/20 1109   05/14/20 0130  vancomycin (VANCOREADY) IVPB 1500 mg/300 mL        1,500 mg 150 mL/hr over 120 Minutes Intravenous  Once 05/14/20 0034 05/14/20 0458   05/10/20 1700  vancomycin (VANCOREADY) IVPB 1500 mg/300 mL  Status:  Discontinued        1,500 mg 150 mL/hr over 120 Minutes Intravenous Every 24 hours 05/10/20 1613 05/12/20 1041   05/10/20 1700  piperacillin-tazobactam (ZOSYN) IVPB 3.375 g        3.375 g 12.5 mL/hr over 240 Minutes Intravenous Every 8 hours 05/10/20 1613     05/10/20 1400  ceFEPIme (MAXIPIME) 2 g in sodium chloride 0.9 % 100 mL IVPB  Status:  Discontinued        2 g 200 mL/hr over 30 Minutes Intravenous Every 8 hours 05/10/20 1002 05/10/20 1108   05/10/20 1330  vancomycin (VANCOREADY) IVPB 1500 mg/300 mL  Status:  Discontinued        1,500 mg 150 mL/hr over 120 Minutes Intravenous Every 24 hours 05/09/20 1355 05/10/20 1108   05/10/20 1215  levofloxacin (LEVAQUIN) tablet 750 mg  Status:  Discontinued        750 mg Oral Daily 05/10/20 1115 05/10/20 2003   05/10/20 0100  ceFEPIme (MAXIPIME) 2 g in sodium chloride 0.9 % 100 mL IVPB  Status:  Discontinued        2 g 200 mL/hr over 30 Minutes Intravenous Every 12 hours 05/09/20 1355 05/10/20 1002   05/09/20 1230  ceFEPIme (MAXIPIME) 2 g in sodium chloride 0.9 % 100 mL IVPB        2 g 200 mL/hr over 30 Minutes Intravenous  Once 05/09/20 1227 05/09/20 1324   05/09/20 1230  vancomycin (VANCOREADY) IVPB 2000 mg/400 mL        2,000 mg 200 mL/hr over 120  Minutes Intravenous  Once 05/09/20 1228 05/09/20 1546      Medications: Scheduled Meds: . aspirin  81 mg Oral Daily  . enoxaparin (LOVENOX) injection  90 mg Subcutaneous Q12H  . insulin aspart  0-15 Units Subcutaneous TID WC  . insulin aspart  0-5 Units Subcutaneous QHS  . lactose free nutrition  237 mL Oral TID WC  . melatonin  5 mg Oral QHS  . metoprolol tartrate  25 mg Oral BID  . mometasone-formoterol  2 puff Inhalation BID  . pantoprazole  40 mg Oral Daily  . simvastatin  20 mg Oral Daily  . sodium chloride flush  3 mL Intravenous Q12H  . tamsulosin  0.4 mg Oral Daily  . umeclidinium bromide  1 puff Inhalation Daily   Continuous Infusions: . sodium chloride    . lactated ringers 100 mL/hr at 05/15/20 0524  . piperacillin-tazobactam (ZOSYN)  IV 3.375 g (05/15/20 0838)   PRN Meds:.sodium chloride, acetaminophen, acetaminophen **OR** [DISCONTINUED] acetaminophen, albuterol, lip balm, metoprolol tartrate, [DISCONTINUED] ondansetron **OR** ondansetron (ZOFRAN) IV    Objective: Weight change:  Intake/Output Summary (Last 24 hours) at 05/15/2020 1642 Last data filed at 05/15/2020 0600 Gross per 24 hour  Intake 481 ml  Output 600 ml  Net -119 ml   Blood pressure 119/71, pulse 84, temperature 98 F (36.7 C), resp. rate 18, height 6\' 2"  (1.88 m), weight 88 kg, SpO2 99 %. Temp:  [97.5 F (36.4 C)-101.4 F (38.6 C)] 98 F (36.7 C) (04/10 1300) Pulse Rate:  [84-124] 84 (04/10 1142) Resp:  [15-30] 18 (04/10 1142) BP: (107-158)/(62-106) 119/71 (04/10 1142) SpO2:  [94 %-100 %] 99 % (04/10 1142)  Physical Exam: Physical Exam Constitutional:      Appearance: He is well-developed. He is ill-appearing.  HENT:     Head: Normocephalic and atraumatic.  Eyes:     Conjunctiva/sclera: Conjunctivae normal.  Cardiovascular:     Rate and Rhythm: Normal rate and regular rhythm.     Heart sounds: No murmur heard. No friction rub. No gallop.   Pulmonary:     Effort: Pulmonary  effort is normal. No respiratory distress.     Breath sounds: Normal breath sounds. No stridor. No wheezing or rhonchi.  Abdominal:     General: Abdomen is flat. There is no distension.     Palpations: Abdomen is soft. There is no mass.  Musculoskeletal:        General: Normal range of motion.     Cervical back: Normal range of motion and neck supple.  Skin:    General: Skin is warm and dry.     Coloration: Skin is pale.     Findings: No erythema or rash.  Neurological:     Mental Status: He is alert. He is disoriented.      CBC:    BMET Recent Labs    05/14/20 0010 05/15/20 0637  NA 139 137  K 3.9 4.1  CL 108 104  CO2 23 23  GLUCOSE 119* 103*  BUN 16 14  CREATININE 1.31* 1.01  CALCIUM 9.7 9.7     Liver Panel  Recent Labs    05/14/20 0010 05/15/20 0637  PROT 5.2* 5.5*  ALBUMIN 2.9* 2.7*  AST 33 39  ALT 19 17  ALKPHOS 78 70  BILITOT 1.1 1.3*       Sedimentation Rate Recent Labs    05/15/20 0637  ESRSEDRATE 58*   C-Reactive Protein Recent Labs    05/14/20 0010 05/15/20 0637  CRP 5.7* 18.0*    Micro Results: Recent Results (from the past 720 hour(s))  Urine culture     Status: None   Collection Time: 05/09/20  2:07 AM   Specimen: In/Out Cath Urine  Result Value Ref Range Status   Specimen Description IN/OUT CATH URINE  Final   Special Requests NONE  Final   Culture   Final    NO GROWTH Performed at [facility], [address] [place], [place] [address]    Report Status 05/11/2020 FINAL  Final  Resp Panel by RT-PCR (Flu A&B, Covid) Nasopharyngeal Swab     Status: Abnormal   Collection Time: 05/09/20 12:12 PM   Specimen: Nasopharyngeal Swab; Nasopharyngeal(NP) swabs in vial transport medium  Result Value Ref Range Status   SARS Coronavirus 2 by RT PCR POSITIVE (A) NEGATIVE Final    Comment: RESULT CALLED TO, READ BACK BY AND VERIFIED WITH: RN P [facility] [id number] AT 1287 BY CM (NOTE) SARS-CoV-2 target nucleic acids are  DETECTED.  The SARS-CoV-2 RNA is generally detectable in upper respiratory specimens during the acute  phase of infection. Positive results are indicative of the presence of the identified virus, but do not rule out bacterial infection or co-infection with other pathogens not detected by the test. Clinical correlation with patient history and other diagnostic information is necessary to determine patient infection status. The expected result is Negative.  Fact Sheet for Patients: EntrepreneurPulse.com.au  Fact Sheet for Healthcare Providers: IncredibleEmployment.be  This test is not yet approved or cleared by the [place] FDA and  has been authorized for detection and/or diagnosis of SARS-CoV-2 by FDA under an Emergency Use Authorization (EUA).  This EUA will remain in effect (meaning this test can be u sed) for the duration of  the COVID-19 declaration under Section 564(b)(1) of the Act, 21 U.S.C. section 360bbb-3(b)(1), unless the authorization is terminated or revoked sooner.     Influenza A by PCR NEGATIVE NEGATIVE Final   Influenza B by PCR NEGATIVE NEGATIVE Final    Comment: (NOTE) The Xpert Xpress SARS-CoV-2/FLU/RSV plus assay is intended as an aid in the diagnosis of influenza from Nasopharyngeal swab specimens and should not be used as a sole basis for treatment. Nasal washings and aspirates are unacceptable for Xpert Xpress SARS-CoV-2/FLU/RSV testing.  Fact Sheet for Patients: EntrepreneurPulse.com.au  Fact Sheet for Healthcare Providers: IncredibleEmployment.be  This test is not yet approved or cleared by the [place] FDA and has been authorized for detection and/or diagnosis of SARS-CoV-2 by FDA under an Emergency Use Authorization (EUA). This EUA will remain in effect (meaning this test can be used) for the duration of the COVID-19 declaration under Section 564(b)(1) of the Act, 21  U.S.C. section 360bbb-3(b)(1), unless the authorization is terminated or revoked.  Performed at [facility], [place] [address] [place], [place] [address]   Blood Culture (routine x 2)     Status: None   Collection Time: 05/09/20 12:12 PM   Specimen: BLOOD  Result Value Ref Range Status   Specimen Description BLOOD SITE NOT SPECIFIED  Final   Special Requests   Final    BOTTLES DRAWN AEROBIC AND ANAEROBIC Blood Culture adequate volume   Culture   Final    NO GROWTH 5 DAYS Performed at [facility], [address], [place], [place] [address]    Report Status 05/14/2020 FINAL  Final  Blood Culture (routine x 2)     Status: None   Collection Time: 05/09/20 12:12 PM   Specimen: BLOOD  Result Value Ref Range Status   Specimen Description BLOOD BLOOD LEFT HAND  Final   Special Requests   Final    BOTTLES DRAWN AEROBIC AND ANAEROBIC Blood Culture adequate volume   Culture   Final    NO GROWTH 5 DAYS Performed at [facility], [place] [address], [place], [place] [address]    Report Status 05/14/2020 FINAL  Final  MRSA PCR Screening     Status: Abnormal   Collection Time: 05/09/20 12:30 PM   Specimen: Nasal Mucosa; Nasopharyngeal  Result Value Ref Range Status   MRSA by PCR POSITIVE (A) NEGATIVE Final    Comment:        The GeneXpert MRSA Assay (FDA approved for NASAL specimens only), is one component of a comprehensive MRSA colonization surveillance program. It is not intended to diagnose MRSA infection nor to guide or monitor treatment for MRSA infections. RESULT CALLED TO, READ BACK BY AND VERIFIED WITH: IDOL,K RN AT 2212 05/09/2020 MITCHELL,L Performed at [facility], [place],  [place] [address]   Culture, blood (routine x 2)     Status: None (Preliminary result)   Collection Time: 05/13/20 11:07 AM   Specimen: BLOOD RIGHT HAND  Result Value Ref Range Status   Specimen Description BLOOD RIGHT HAND  Final   Special Requests   Final     BOTTLES DRAWN AEROBIC AND ANAEROBIC Blood Culture adequate volume   Culture   Final    NO GROWTH 2 DAYS Performed at [facility], [place] [address], [place], [place] [address]    Report Status PENDING  Incomplete  Culture, blood (routine x 2)     Status: None (Preliminary result)   Collection Time: 05/13/20 11:13 AM   Specimen: BLOOD LEFT HAND  Result Value Ref Range Status   Specimen Description BLOOD LEFT HAND  Final   Special Requests   Final    BOTTLES DRAWN AEROBIC AND ANAEROBIC Blood Culture adequate volume   Culture   Final    NO GROWTH 2 DAYS Performed at [facility], [place] [address], [place], [place] [address]    Report Status PENDING  Incomplete    Studies/Results: CT ANGIO CHEST PE W OR WO CONTRAST  Result Date: 05/14/2020 CLINICAL DATA:  High probability sepsis due to pneumonia EXAM: CT ANGIOGRAPHY CHEST WITH CONTRAST TECHNIQUE: Multidetector CT imaging of the chest was performed using the standard protocol during bolus administration of intravenous contrast. Multiplanar CT image reconstructions and MIPs were obtained to evaluate the vascular anatomy. CONTRAST:  52mL OMNIPAQUE IOHEXOL 350 MG/ML SOLN COMPARISON:  04/02/2020 FINDINGS: Cardiovascular: Satisfactory opacification by motion of pulmonary artifact. Arteries although limited there is a convincing branching pulmonary artery filling defect affecting segmental vessels in the basal right lower lobe as marked on series 6. Additional peripheral emboli could be present and obscured by the degree of motion artifact. No central embolism is seen. No evidence of right heart strain. Heart size is normal. No pericardial effusion. Atherosclerosis of the coronaries. Mediastinum/Nodes: Negative for adenopathy or mass. Lungs/Pleura: Chronic lung disease with reticulation. Bilateral pleural plaque appearance without calcification. There is increased ground-glass density scattered in the bilateral lungs, greatest in the right upper  lobe. No interlobular septal thickening or pleural fluid to implicate failure. Mild centrilobular emphysema. Upper Abdomen: Negative Musculoskeletal: No unexpected finding Review of the MIP images confirms the above findings. Critical Value/emergent results were called by telephone at the time of interpretation on 05/14/2020 at 8:35 am to provider Elgergawy, who verbally acknowledged these results. IMPRESSION: 1. Positive for segmental pulmonary embolism in the right lower lobe. Additional pulmonary emboli could be obscured by the degree of motion artifact. 2. Background of chronic opacities related to recent COVID infection. There is also new ground-glass opacity especially in the right upper lobe suggesting a superimposed acute pneumonia. 3. Aortic Atherosclerosis (ICD10-I70.0).  Coronary atherosclerosis. Electronically Signed   By: Monte Fantasia M.D.   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 male with past medical significant for rheumatoid arthritis coronary disease, COPD BPH prior lumbar surgery with herniated nucleus pulposis diverticulosis COVID-19 infection in January with multiple hospitalizations.  He was  admitted this time for acute hypoxic respiratory failure to be due to acute on chronic congestive heart failure.  He has now been found to have a deep venous thrombosis and pulmonary embolism.  He has had fevers despite broad-spectrum antibiotics in the form of vancomycin and Zosyn.  I discontinued his vancomycin yesterday as a.co target for it.  I did not realize that he is already had an MRI of the lumbar spine on Friday which did not show  any evidence of infection in the disc space or vertebral bodies.  CT abdomen pelvis is still pending.  FUO labs have also been sent with acute hepatitis panel and HIV antibodies being negative cryptococcal antigen being negative RPR being negative CMV IgM and IgG EBV serologies consistent with past infection.  LDH and inflammatory markers are elevated and + ANA  Await CT abdomen the pelvis.  We will follow up tomorrow     LOS: 6 days   Alcide Evener 05/15/2020, 4:42 PM

## 2020-05-16 ENCOUNTER — Inpatient Hospital Stay (HOSPITAL_COMMUNITY): Payer: Medicare Other

## 2020-05-16 DIAGNOSIS — G9340 Encephalopathy, unspecified: Secondary | ICD-10-CM

## 2020-05-16 DIAGNOSIS — R509 Fever, unspecified: Secondary | ICD-10-CM | POA: Diagnosis not present

## 2020-05-16 DIAGNOSIS — J189 Pneumonia, unspecified organism: Secondary | ICD-10-CM | POA: Diagnosis not present

## 2020-05-16 DIAGNOSIS — A419 Sepsis, unspecified organism: Secondary | ICD-10-CM | POA: Diagnosis not present

## 2020-05-16 LAB — GLUCOSE, CAPILLARY
Glucose-Capillary: 134 mg/dL — ABNORMAL HIGH (ref 70–99)
Glucose-Capillary: 133 mg/dL — ABNORMAL HIGH (ref 70–99)
Glucose-Capillary: 136 mg/dL — ABNORMAL HIGH (ref 70–99)
Glucose-Capillary: 175 mg/dL — ABNORMAL HIGH (ref 70–99)

## 2020-05-16 LAB — ANTINUCLEAR ANTIBODIES, IFA: ANA Ab, IFA: NEGATIVE

## 2020-05-16 LAB — CBC
HCT: 30.2 % — ABNORMAL LOW (ref 39.0–52.0)
Hemoglobin: 9.5 g/dL — ABNORMAL LOW (ref 13.0–17.0)
MCH: 28.4 pg (ref 26.0–34.0)
MCHC: 31.5 g/dL (ref 30.0–36.0)
MCV: 90.1 fL (ref 80.0–100.0)
Platelets: 233 10*3/uL (ref 150–400)
RBC: 3.35 MIL/uL — ABNORMAL LOW (ref 4.22–5.81)
RDW: 18.6 % — ABNORMAL HIGH (ref 11.5–15.5)
WBC: 6.1 10*3/uL (ref 4.0–10.5)
nRBC: 0 % (ref 0.0–0.2)

## 2020-05-16 LAB — BASIC METABOLIC PANEL
Anion gap: 4 — ABNORMAL LOW (ref 5–15)
BUN: 21 mg/dL (ref 8–23)
CO2: 25 mmol/L (ref 22–32)
Calcium: 9.1 mg/dL (ref 8.9–10.3)
Chloride: 104 mmol/L (ref 98–111)
Creatinine, Ser: 0.97 mg/dL (ref 0.61–1.24)
GFR, Estimated: >60 mL/min (ref >60)
Glucose, Bld: 202 mg/dL — ABNORMAL HIGH (ref 70–99)
Potassium: 3.6 mmol/L (ref 3.5–5.1)
Sodium: 133 mmol/L — ABNORMAL LOW (ref 135–145)

## 2020-05-16 LAB — HISTOPLASMA ANTIGEN, URINE: Histoplasma Antigen, urine: <0.5 (ref <0.5)

## 2020-05-16 LAB — RHEUMATOID FACTOR: Rheumatoid fact SerPl-aCnc: 12.9 IU/mL (ref <14.0)

## 2020-05-16 LAB — ANGIOTENSIN CONVERTING ENZYME: Angiotensin-Converting Enzyme: 34 U/L (ref 14–82)

## 2020-05-16 LAB — PROCALCITONIN: Procalcitonin: <0.1 ng/mL

## 2020-05-16 LAB — CRYOGLOBULIN

## 2020-05-16 MED ORDER — HYDROCORTISONE 20 MG PO TABS
20.0000 mg | ORAL_TABLET | Freq: Every day | ORAL | Status: DC
  Administered 2020-05-16: 20 mg via ORAL
  Filled 2020-05-16: qty 1

## 2020-05-16 MED ORDER — GADOBUTROL 1 MMOL/ML IV SOLN
8.0000 mL | Freq: Once | INTRAVENOUS | Status: AC | PRN
  Administered 2020-05-16: 8 mL via INTRAVENOUS

## 2020-05-16 MED ORDER — PREDNISONE 20 MG PO TABS: 40.0000 mg | ORAL_TABLET | Freq: Every day | ORAL | Status: DC

## 2020-05-16 MED ORDER — METHYLPREDNISOLONE SODIUM SUCC 125 MG IJ SOLR
80.0000 mg | Freq: Four times a day (QID) | INTRAMUSCULAR | Status: DC
  Administered 2020-05-16 – 2020-05-17 (×3): 80 mg via INTRAVENOUS
  Filled 2020-05-16 (×3): qty 2

## 2020-05-16 MED ORDER — APIXABAN 5 MG PO TABS: 10.0000 mg | ORAL_TABLET | Freq: Two times a day (BID) | ORAL | Status: DC

## 2020-05-16 MED ORDER — APIXABAN 5 MG PO TABS: 5.0000 mg | ORAL_TABLET | Freq: Two times a day (BID) | ORAL | Status: DC

## 2020-05-16 MED ORDER — HEPARIN (PORCINE) 25000 UT/250ML-% IV SOLN
1250.0000 [IU]/h | INTRAVENOUS | Status: AC
  Administered 2020-05-16: 1400 [IU]/h via INTRAVENOUS
  Filled 2020-05-16: qty 250

## 2020-05-16 NOTE — Progress Notes (Signed)
   05/16/20 1558  Assess: MEWS Score  Temp (!) 102.9 F (39.4 C)  BP (!) 165/83  Pulse Rate (!) 143  ECG Heart Rate (!) 143  Resp (!) 25  SpO2 90 %  O2 Device Nasal Cannula  O2 Flow Rate (L/min) 4 L/min  Assess: MEWS Score  MEWS Temp 2  MEWS Systolic 0  MEWS Pulse 3  MEWS RR 1  MEWS LOC 0  MEWS Score 6  MEWS Score Color Red  Assess: if the MEWS score is Yellow or Red  Were vital signs taken at a resting state? Yes  Focused Assessment Change from prior assessment (see assessment flowsheet)  Early Detection of Sepsis Score *See Row Information* High  MEWS guidelines implemented *See Row Information* Yes  Treat  MEWS Interventions Escalated (See documentation below);Administered prn meds/treatments  Take Vital Signs  Increase Vital Sign Frequency  Red: Q 1hr X 4 then Q 4hr X 4, if remains red, continue Q 4hrs  Escalate  MEWS: Escalate Red: discuss with charge nurse/RN and provider, consider discussing with RRT  Notify: Charge Nurse/RN  Name of Charge Nurse/RN Notified Liana, RN  Date Charge Nurse/RN Notified 05/16/20  Time Charge Nurse/RN Notified 82  Notify: Provider  Provider Name/Title [facility]  Date Provider Notified 05/16/20  Time Provider Notified 1610  Notification Type Page  Notification Reason Change in status  Provider response No new orders  Date of Provider Response 05/16/20  Time of Provider Response 1610

## 2020-05-16 NOTE — Progress Notes (Signed)
PROGRESS NOTE                                                                                                                                                                                                             Patient Demographics:    Tyrone Roman, is a 75 y.o. male, DOB - [birth year], WIO:[id number]  Outpatient Primary MD for the patient is [name], MD    LOS - 7  Admit date - 05/09/2020    Chief Complaint  Patient presents with  . Tachycardia       Brief Narrative (HPI from H&P)    - Tyrone Roman is a 75 y.o. male with medical history significant of RA; CAD s/p stent; HTN; HLD; COPD; and BPH presenting with tachycardia.  He can't remember what happened, work-up here suggested hypoxia CHF/pneumonia of care, admitted for further work-up, respiratory status improved after initial diuresis, patient kept having episodes of SIRS, fever, tachycardia and hypotension, as well having elevated D-dimers, venous Dopplers was significant for left lower extremity DVT, and CTA chest was significant for PE, and the typical of pneumonia, hospital stay was complicated by fever of unknown origin, ID was consulted.   Subjective:    Tyrone Roman denies any fever or chills today, reports he is feeling much better, and asking when he can go home.   Assessment  & Plan :     Acute Hypoxic Resp. -Multifactorial, some volume overload , as well evidence of PE, and difficult pneumonia. -Encouraged to use  incentive spirometry and flutter valve  SIRS/FUO - patient continues to have intermittent fever, hypotensive episode and he is immunosuppressed due to his medication from rheumatoid arthritis. -So far septic work-up is nonrevealing, so far blood cultures are negative, UA remains negative as well . -ABX  management per ID, vancomycin has been discontinued, continue with Zosyn  -MRI lumbar spine with/without contrast with no  evidence of discitis or infection. -SARS antibody nonreactive -Further labs were ordered by ID including HIV viral hepatitis panel ANA LDH QuantiFERON gold cryptococcal antigen histoplasma antigen Blastomyces antigen . -No acute infection in EBV or CMV, (both negative IgM ) -CT abdomen pelvis with no acute findings abdomen and pelvis, but significant for worsening PE. -Fever most likely due to acute DVT/PE versus adrenal insufficiency, will await further recommendation from ID.  Questionable  steroid dependency  -Patient on low-dose prednisone for his rheumatoid arthritis, with recent increased dose prednisone due to his recent COVID-19 infection . -He was started on stress dose steroids initially, he had significant improvement in that, even his fever has resolved, he was tapered off, his fever came back, he was confused his back, he became confused and weaker .  Even though his ACTH stim test is not diagnostic of adrenal insufficiency. -I have given patient 1 dose of IV hydrocortisone yesterday, no recurrence of fever, he is more awake appropriate alert, appetite has improved(from being obtundent, incoherent with no oral intake and febrile) he will be started on hydrocortisone and p.o. prednisone, have discussed with him on the right bedside, they need to follow with endocrinology as an outpatient regarding further recommendations.  Acute DVT/PE -Venous Doppler significant for left lower extremity extensive acute DVT. -Drink yesterday showing increased clot burden in the lung, I have discussed with vascular surgery who reviewed the venous Dopplers, there is no DVT in iliac area or common femoral, so no indication for thrombectomy . -We will change Lovenox to Eliquis.  Currently on Lovenox, will transition to Eliquis in 1 to 2 days  SpO2: 92 % O2 Flow Rate (L/min): 4 L/min FiO2 (%): 28 %  Recent Labs  Lab 05/09/20 1621 05/10/20 0106 05/10/20 0807 05/11/20 0131 05/12/20 0300 05/13/20 0135  05/13/20 0900 05/14/20 0010 05/14/20 0257 05/15/20 0637 05/16/20 0057  WBC  --    < >  --  3.9* 7.7 8.1  --  5.7  --  7.9 6.1  HGB  --    < >  --  9.5* 8.9* 9.5*  --  9.0*  --  10.5* 9.5*  HCT  --    < >  --  29.8* 28.0* 29.8*  --  28.2*  --  33.8* 30.2*  PLT  --    < >  --  193 210 248  --  242  --  275 233  CRP  --    < > 11.2* 10.5* 5.6* 3.2*  --  5.7*  --  18.0*  --   BNP  --   --  107.5* 122.6* 98.9 72.6  --  144.3*  --   --   --   DDIMER  --   --  3.48* 2.75* 3.45* 3.76*  --  2.27*  --   --   --   PROCALCITON  --   --   --   --   --   --  0.11 <0.10  --  <0.10 <0.10  AST  --   --   --  25 23 36  --  33  --  39  --   ALT  --   --   --  17 13 20   --  19  --  17  --   ALKPHOS  --   --   --  74 63 68  --  78  --  70  --   BILITOT  --   --   --  0.9 0.4 0.7  --  1.1  --  1.3*  --   ALBUMIN  --   --   --  1.9* 1.9* 2.1*  --  2.9*  --  2.7*  --   LATICACIDVEN 1.6  --   --   --   --   --   --  1.6 1.4  --   --    < > =  values in this interval not displayed.     Deconditioning, -Patient has had two recent admissions to the hospital . Supportive care with PT OT. At risk for delirium, minimize narcotics and benzodiazepines.   Metabolic encephalopathy. -Resolved, mentation back to baseline  Acute on Chronic diastolic CHF recent EF 55 to 60%. C  BPH. On Flomax.  Dyslipidemia. On statin continue.  History of Rheumatoid arthritis. Takes Rituxan every 4 months.  HTN  - Currently off midodrine  Hyperglycemia -pre Diabetes with A1c of 6.3       Condition - Extremely Guarded  Family Communication  : Discussed with daughter at bedside  Code Status : Full code, confirmed by patient and daughter  Consults  :  Pall.Care  PUD Prophylaxis : PPI   Procedures  :     Leg ultrasound.      Disposition Plan  :    Status is: Inpatient  Remains inpatient appropriate because:IV treatments appropriate due to intensity of illness or inability to take PO   Dispo: The  patient is from: SNF              Anticipated d/c is to: SNF              Patient currently is not medically stable to d/c.   Difficult to place patient No   DVT Prophylaxis  :  Lovenox   Lab Results  Component Value Date   PLT 233 05/16/2020    Diet :  Diet Order            Diet Carb Modified Fluid consistency: Thin; Room service appropriate? Yes  Diet effective now                  Inpatient Medications  Scheduled Meds: . apixaban  10 mg Oral BID   Followed by  . [START ON 05/23/2020] apixaban  5 mg Oral BID  . aspirin  81 mg Oral Daily  . hydrocortisone  20 mg Oral Daily  . insulin aspart  0-15 Units Subcutaneous TID WC  . insulin aspart  0-5 Units Subcutaneous QHS  . lactose free nutrition  237 mL Oral TID WC  . melatonin  5 mg Oral QHS  . metoprolol tartrate  25 mg Oral BID  . mometasone-formoterol  2 puff Inhalation BID  . pantoprazole  40 mg Oral Daily  . [START ON 05/17/2020] predniSONE  40 mg Oral Q breakfast  . simvastatin  20 mg Oral Daily  . sodium chloride flush  3 mL Intravenous Q12H  . tamsulosin  0.4 mg Oral Daily  . umeclidinium bromide  1 puff Inhalation Daily   Continuous Infusions: . sodium chloride    . lactated ringers 100 mL/hr at 05/16/20 0542   PRN Meds:.sodium chloride, acetaminophen, acetaminophen **OR** [DISCONTINUED] acetaminophen, albuterol, lip balm, metoprolol tartrate, [DISCONTINUED] ondansetron **OR** ondansetron (ZOFRAN) IV  Antibiotics  :    Anti-infectives (From admission, onward)   Start     Dose/Rate Route Frequency Ordered Stop   05/15/20 2000  vancomycin (VANCOREADY) IVPB 1500 mg/300 mL  Status:  Discontinued        1,500 mg 150 mL/hr over 120 Minutes Intravenous Every 24 hours 05/14/20 0034 05/14/20 1109   05/14/20 1100  levofloxacin (LEVAQUIN) tablet 750 mg  Status:  Discontinued        750 mg Oral Daily 05/14/20 0902 05/14/20 1109   05/14/20 0130  vancomycin (VANCOREADY) IVPB 1500 mg/300 mL        1,500  mg 150  mL/hr over 120 Minutes Intravenous  Once 05/14/20 0034 05/14/20 0458   05/10/20 1700  vancomycin (VANCOREADY) IVPB 1500 mg/300 mL  Status:  Discontinued        1,500 mg 150 mL/hr over 120 Minutes Intravenous Every 24 hours 05/10/20 1613 05/12/20 1041   05/10/20 1700  piperacillin-tazobactam (ZOSYN) IVPB 3.375 g  Status:  Discontinued        3.375 g 12.5 mL/hr over 240 Minutes Intravenous Every 8 hours 05/10/20 1613 05/16/20 1144   05/10/20 1400  ceFEPIme (MAXIPIME) 2 g in sodium chloride 0.9 % 100 mL IVPB  Status:  Discontinued        2 g 200 mL/hr over 30 Minutes Intravenous Every 8 hours 05/10/20 1002 05/10/20 1108   05/10/20 1330  vancomycin (VANCOREADY) IVPB 1500 mg/300 mL  Status:  Discontinued        1,500 mg 150 mL/hr over 120 Minutes Intravenous Every 24 hours 05/09/20 1355 05/10/20 1108   05/10/20 1215  levofloxacin (LEVAQUIN) tablet 750 mg  Status:  Discontinued        750 mg Oral Daily 05/10/20 1115 05/10/20 2003   05/10/20 0100  ceFEPIme (MAXIPIME) 2 g in sodium chloride 0.9 % 100 mL IVPB  Status:  Discontinued        2 g 200 mL/hr over 30 Minutes Intravenous Every 12 hours 05/09/20 1355 05/10/20 1002   05/09/20 1230  ceFEPIme (MAXIPIME) 2 g in sodium chloride 0.9 % 100 mL IVPB        2 g 200 mL/hr over 30 Minutes Intravenous  Once 05/09/20 1227 05/09/20 1324   05/09/20 1230  vancomycin (VANCOREADY) IVPB 2000 mg/400 mL        2,000 mg 200 mL/hr over 120 Minutes Intravenous  Once 05/09/20 1228 05/09/20 1546       Time Spent in minutes  30   Emeline Gins Quintus Premo M.D on 05/16/2020 at 1:17 PM  To page go to [email or web address]   [facility] -  Office  [phone]    See all Orders from today for further details    Objective:   Vitals:   05/15/20 2354 05/16/20 0353 05/16/20 0743 05/16/20 1201  BP: 95/63 128/77 (!) 143/84 130/71  Pulse: 77 98 86 (!) 111  Resp: 19 20 18 20   Temp: (!) 97 F (36.1 C) 98 F (36.7 C) (!) 97.4 F (36.3 C) 98.6 F (37 C)  TempSrc: Oral  Rectal Oral Axillary  SpO2: 97% 95% 95% 92%  Weight:      Height:        Wt Readings from Last 3 Encounters:  05/09/20 88 kg  05/05/20 88.5 kg  04/26/20 88.5 kg     Intake/Output Summary (Last 24 hours) at 05/16/2020 1317 Last data filed at 05/16/2020 0542 Gross per 24 hour  Intake --  Output 500 ml  Net -500 ml     Physical Exam  Awake Alert, Oriented X 3, No new F.N deficits, Normal affect, extremely frail Symmetrical Chest wall movement, Good air movement bilaterally, CTAB RRR,No Gallops,Rubs or new Murmurs, No Parasternal Heave +ve B.Sounds, Abd Soft, No tenderness, No rebound - guarding or rigidity. No Cyanosis, Clubbing or edema, No new Rash or bruise       Data Review:    CBC Recent Labs  Lab 05/11/20 0131 05/12/20 0300 05/13/20 0135 05/14/20 0010 05/15/20 0637 05/16/20 0057  WBC 3.9* 7.7 8.1 5.7 7.9 6.1  HGB 9.5* 8.9* 9.5* 9.0* 10.5* 9.5*  HCT 29.8* 28.0*  29.8* 28.2* 33.8* 30.2*  PLT 193 210 248 242 275 233  MCV 87.9 87.8 87.9 89.8 92.3 90.1  MCH 28.0 27.9 28.0 28.7 28.7 28.4  MCHC 31.9 31.8 31.9 31.9 31.1 31.5  RDW 18.0* 18.0* 18.3* 18.8* 19.2* 18.6*  LYMPHSABS 0.4* 0.5* 0.6* 0.4*  --   --   MONOABS 0.1 0.6 0.5 0.3  --   --   EOSABS 0.0 0.0 0.2 0.3  --   --   BASOSABS 0.0 0.0 0.0 0.0  --   --     Recent Labs  Lab 05/09/20 1621 05/10/20 0106 05/10/20 0807 05/11/20 0131 05/12/20 0300 05/12/20 0741 05/13/20 0135 05/13/20 0900 05/14/20 0010 05/14/20 0257 05/15/20 0637 05/16/20 0057  NA  --    < >  --  136 133*  --  139  --  139  --  137 133*  K  --    < >  --  3.8 3.0*  --  3.0*  --  3.9  --  4.1 3.6  CL  --    < >  --  103 102  --  104  --  108  --  104 104  CO2  --    < >  --  24 22  --  24  --  23  --  23 25  GLUCOSE  --    < >  --  177* 292*  --  111*  --  119*  --  103* 202*  BUN  --    < >  --  17 23  --  23  --  16  --  14 21  CREATININE  --    < >  --  1.23 1.22  --  1.29*  --  1.31*  --  1.01 0.97  CALCIUM  --    < >  --  9.3  9.0  --  9.4  --  9.7  --  9.7 9.1  AST  --   --   --  25 23  --  36  --  33  --  39  --   ALT  --   --   --  17 13  --  20  --  19  --  17  --   ALKPHOS  --   --   --  74 63  --  68  --  78  --  70  --   BILITOT  --   --   --  0.9 0.4  --  0.7  --  1.1  --  1.3*  --   ALBUMIN  --   --   --  1.9* 1.9*  --  2.1*  --  2.9*  --  2.7*  --   MG  --   --  1.4* 1.8 1.7  --  1.6*  --  1.8  --   --   --   CRP  --    < > 11.2* 10.5* 5.6*  --  3.2*  --  5.7*  --  18.0*  --   DDIMER  --   --  3.48* 2.75* 3.45*  --  3.76*  --  2.27*  --   --   --   PROCALCITON  --   --   --   --   --   --   --  0.11 <0.10  --  <0.10 <0.10  LATICACIDVEN 1.6  --   --   --   --   --   --   --  1.6 1.4  --   --   TSH  --   --  1.040  --   --   --   --   --   --   --   --   --   HGBA1C  --   --   --   --   --  6.3*  --   --   --   --   --   --   BNP  --   --  107.5* 122.6* 98.9  --  72.6  --  144.3*  --   --   --    < > = values in this interval not displayed.    ------------------------------------------------------------------------------------------------------------------ No results for input(s): CHOL, HDL, LDLCALC, TRIG, CHOLHDL, LDLDIRECT in the last 72 hours.  Lab Results  Component Value Date   HGBA1C 6.3 (H) 05/12/2020   ------------------------------------------------------------------------------------------------------------------ No results for input(s): TSH, T4TOTAL, T3FREE, THYROIDAB in the last 72 hours.  Invalid input(s): FREET3  Cardiac Enzymes No results for input(s): CKMB, TROPONINI, MYOGLOBIN in the last 168 hours.  Invalid input(s): CK ------------------------------------------------------------------------------------------------------------------    Component Value Date/Time   BNP 144.3 (H) 05/14/2020 0010    Micro Results Recent Results (from the past 240 hour(s))  Urine culture     Status: None   Collection Time: 05/09/20  2:07 AM   Specimen: In/Out Cath Urine  Result Value Ref  Range Status   Specimen Description IN/OUT CATH URINE  Final   Special Requests NONE  Final   Culture   Final    NO GROWTH Performed at [facility], [address] [place], [place] [address]    Report Status 05/11/2020 FINAL  Final  Resp Panel by RT-PCR (Flu A&B, Covid) Nasopharyngeal Swab     Status: Abnormal   Collection Time: 05/09/20 12:12 PM   Specimen: Nasopharyngeal Swab; Nasopharyngeal(NP) swabs in vial transport medium  Result Value Ref Range Status   SARS Coronavirus 2 by RT PCR POSITIVE (A) NEGATIVE Final    Comment: RESULT CALLED TO, READ BACK BY AND VERIFIED WITH: RN P [facility] [id number] AT 2423 BY CM (NOTE) SARS-CoV-2 target nucleic acids are DETECTED.  The SARS-CoV-2 RNA is generally detectable in upper respiratory specimens during the acute phase of infection. Positive results are indicative of the presence of the identified virus, but do not rule out bacterial infection or co-infection with other pathogens not detected by the test. Clinical correlation with patient history and other diagnostic information is necessary to determine patient infection status. The expected result is Negative.  Fact Sheet for Patients: EntrepreneurPulse.com.au  Fact Sheet for Healthcare Providers: IncredibleEmployment.be  This test is not yet approved or cleared by the [place] FDA and  has been authorized for detection and/or diagnosis of SARS-CoV-2 by FDA under an Emergency Use Authorization (EUA).  This EUA will remain in effect (meaning this test can be u sed) for the duration of  the COVID-19 declaration under Section 564(b)(1) of the Act, 21 U.S.C. section 360bbb-3(b)(1), unless the authorization is terminated or revoked sooner.     Influenza A by PCR NEGATIVE NEGATIVE Final   Influenza B by PCR NEGATIVE NEGATIVE Final    Comment: (NOTE) The Xpert Xpress SARS-CoV-2/FLU/RSV plus assay is intended as an aid in the diagnosis of  influenza from Nasopharyngeal swab specimens and should not be used as a sole basis for treatment. Nasal washings and aspirates are unacceptable for Xpert Xpress SARS-CoV-2/FLU/RSV testing.  Fact Sheet for Patients:  EntrepreneurPulse.com.au  Fact Sheet for Healthcare Providers: IncredibleEmployment.be  This test is not yet approved or cleared by the [place] FDA and has been authorized for detection and/or diagnosis of SARS-CoV-2 by FDA under an Emergency Use Authorization (EUA). This EUA will remain in effect (meaning this test can be used) for the duration of the COVID-19 declaration under Section 564(b)(1) of the Act, 21 U.S.C. section 360bbb-3(b)(1), unless the authorization is terminated or revoked.  Performed at [facility], [place] [address], [place], [place] [address]   Blood Culture (routine x 2)     Status: None   Collection Time: 05/09/20 12:12 PM   Specimen: BLOOD  Result Value Ref Range Status   Specimen Description BLOOD SITE NOT SPECIFIED  Final   Special Requests   Final    BOTTLES DRAWN AEROBIC AND ANAEROBIC Blood Culture adequate volume   Culture   Final    NO GROWTH 5 DAYS Performed at [facility], [address] [place], [place] [address]    Report Status 05/14/2020 FINAL  Final  Blood Culture (routine x 2)     Status: None   Collection Time: 05/09/20 12:12 PM   Specimen: BLOOD  Result Value Ref Range Status   Specimen Description BLOOD BLOOD LEFT HAND  Final   Special Requests   Final    BOTTLES DRAWN AEROBIC AND ANAEROBIC Blood Culture adequate volume   Culture   Final    NO GROWTH 5 DAYS Performed at [facility], [place] [address] [place], [place] [address]    Report Status 05/14/2020 FINAL  Final  MRSA PCR Screening     Status: Abnormal   Collection Time: 05/09/20 12:30 PM   Specimen: Nasal Mucosa; Nasopharyngeal  Result Value Ref Range Status   MRSA by PCR POSITIVE (A) NEGATIVE Final     Comment:        The GeneXpert MRSA Assay (FDA approved for NASAL specimens only), is one component of a comprehensive MRSA colonization surveillance program. It is not intended to diagnose MRSA infection nor to guide or monitor treatment for MRSA infections. RESULT CALLED TO, READ BACK BY AND VERIFIED WITH: IDOL,K RN AT 2212 05/09/2020 MITCHELL,L Performed at [facility], [place] [address], [place], [place] [address]   Culture, blood (routine x 2)     Status: None (Preliminary result)   Collection Time: 05/13/20 11:07 AM   Specimen: BLOOD RIGHT HAND  Result Value Ref Range Status   Specimen Description BLOOD RIGHT HAND  Final   Special Requests   Final    BOTTLES DRAWN AEROBIC AND ANAEROBIC Blood Culture adequate volume   Culture   Final    NO GROWTH 3 DAYS Performed at [facility], [place] [address], [place], [place] [address]    Report Status PENDING  Incomplete  Culture, blood (routine x 2)     Status: None (Preliminary result)   Collection Time: 05/13/20 11:13 AM   Specimen: BLOOD LEFT HAND  Result Value Ref Range Status   Specimen Description BLOOD LEFT HAND  Final   Special Requests   Final    BOTTLES DRAWN AEROBIC AND ANAEROBIC Blood Culture adequate volume   Culture   Final    NO GROWTH 3 DAYS Performed at [facility], [place] [address] [place], [place] [address]    Report Status PENDING  Incomplete    Radiology Reports CT ANGIO CHEST PE W OR WO CONTRAST  Result Date: 05/14/2020  CLINICAL DATA:  High probability sepsis due to pneumonia EXAM: CT ANGIOGRAPHY CHEST WITH CONTRAST TECHNIQUE: Multidetector CT imaging of the chest was performed using the standard protocol during bolus administration of intravenous contrast. Multiplanar CT image reconstructions and MIPs were obtained to evaluate the vascular anatomy. CONTRAST:  51mL OMNIPAQUE IOHEXOL 350 MG/ML SOLN COMPARISON:  04/02/2020 FINDINGS: Cardiovascular: Satisfactory opacification by motion of pulmonary  artifact. Arteries although limited there is a convincing branching pulmonary artery filling defect affecting segmental vessels in the basal right lower lobe as marked on series 6. Additional peripheral emboli could be present and obscured by the degree of motion artifact. No central embolism is seen. No evidence of right heart strain. Heart size is normal. No pericardial effusion. Atherosclerosis of the coronaries. Mediastinum/Nodes: Negative for adenopathy or mass. Lungs/Pleura: Chronic lung disease with reticulation. Bilateral pleural plaque appearance without calcification. There is increased ground-glass density scattered in the bilateral lungs, greatest in the right upper lobe. No interlobular septal thickening or pleural fluid to implicate failure. Mild centrilobular emphysema. Upper Abdomen: Negative Musculoskeletal: No unexpected finding Review of the MIP images confirms the above findings. Critical Value/emergent results were called by telephone at the time of interpretation on 05/14/2020 at 8:35 am to provider Jasmyne Lodato, who verbally acknowledged these results. IMPRESSION: 1. Positive for segmental pulmonary embolism in the right lower lobe. Additional pulmonary emboli could be obscured by the degree of motion artifact. 2. Background of chronic opacities related to recent COVID infection. There is also new ground-glass opacity especially in the right upper lobe suggesting a superimposed acute pneumonia. 3. Aortic Atherosclerosis (ICD10-I70.0).  Coronary atherosclerosis. Electronically Signed   By: Monte Fantasia M.D.   On: 05/14/2020 08:35   MR Lumbar Spine W Wo Contrast  Result Date: 05/13/2020 CLINICAL DATA:  Fever, lumbar spine tenderness EXAM: MRI LUMBAR SPINE WITHOUT AND WITH CONTRAST TECHNIQUE: Multiplanar and multiecho pulse sequences of the lumbar spine were obtained without and with intravenous contrast. CONTRAST:  43mL GADAVIST GADOBUTROL 1 MMOL/ML IV SOLN COMPARISON:  04/03/2018 FINDINGS:  Segmentation:  Standard. Alignment:  Stable. Vertebrae: There is now fusion across the L1-L2 disc space. There is no new marrow edema or enhancement. Multilevel degenerative plate irregularity and Schmorl's nodes. Conus medullaris and cauda equina: Conus extends to the L1-L2 level. Conus and cauda equina appear normal. No intrathecal enhancement. Paraspinal and other soft tissues: Mild lower lumbar paraspinal edema. No evidence of collection. Disc levels: L1-L2: Prior right laminectomy. Stable retrolisthesis with bridging endplate osteophytes effacing the ventral thecal sac. Similar mild to moderate foraminal stenosis. L2-L3: Disc bulge. Facet arthropathy with ligamentum flavum infolding. Slightly increased mild canal stenosis. Similar mild foraminal stenosis. L3-L4: Disc bulge. Facet arthropathy with ligamentum flavum infolding. Similar moderate canal stenosis with effacement of subarticular recesses. Similar mild to moderate left greater than right foraminal stenosis. L4-L5: Disc bulge with superimposed left subarticular/foraminal protrusion. Facet arthropathy with ligamentum flavum infolding. Similar mild canal stenosis. Similar partial effacement of the left subarticular recess. Similar mild foraminal stenosis. L5-S1: Disc bulge with endplate osteophytic ridging. Facet arthropathy. No canal stenosis. Similar mild foraminal stenosis. IMPRESSION: No evidence of discitis/osteomyelitis. Multilevel degenerative changes as detailed above without substantial change since the prior examination. Electronically Signed   By: Macy Mis M.D.   On: 05/13/2020 14:25   CT ABDOMEN PELVIS W CONTRAST  Result Date: 05/15/2020 CLINICAL DATA:  Sepsis.  Fever of unknown origin. EXAM: CT ABDOMEN AND PELVIS WITH CONTRAST TECHNIQUE: Multidetector CT imaging of the abdomen and pelvis was performed using the  standard protocol following bolus administration of intravenous contrast. CONTRAST:  15mL OMNIPAQUE IOHEXOL 300 MG/ML  SOLN  COMPARISON:  04/02/2020.  Chest CTA obtained earlier today. FINDINGS: Lower chest: Progressive pulmonary embolism on the right, with emboli currently demonstrated in the distal right main pulmonary artery and significantly more clot in the intersegmental and proximal right lower lobe pulmonary arteries. Bilateral lung densities and atelectasis with improvement since this morning. Hepatobiliary: Diffuse low density of the liver relative to the spleen. Normal appearing gallbladder. Pancreas: Unremarkable. No pancreatic ductal dilatation or surrounding inflammatory changes. Spleen: Normal in size without focal abnormality. Adrenals/Urinary Tract: Normal appearing adrenal glands. Bilateral renal cysts. Multiple small bilateral renal calculi. No bladder or ureteral calculi are seen. Excreted contrast in the urinary bladder from the CTA earlier today. Stomach/Bowel: Multiple colonic diverticula without evidence of diverticulitis. Unremarkable stomach, small bowel and appendix. Vascular/Lymphatic: Atheromatous arterial calcifications without aneurysm. No enlarged lymph nodes. Two renal arteries on the left. Reproductive: Mildly enlarged prostate gland. Other: Small bilateral inguinal hernias containing fat and very small umbilical artery containing fat. Musculoskeletal: Stable lumbar and lower thoracic spine degenerative changes and fusion of the L1 and L2 vertebral bodies. IMPRESSION: 1. Progressive pulmonary embolism on the right, with emboli currently demonstrated in the distal right main pulmonary artery and significantly more clot in the intersegmental and proximal right lower lobe pulmonary arteries. These results will be called to the ordering clinician or representative by the Radiologist Assistant, and communication documented in the PACS or [employer]. 2. Lung densities and atelectasis with improvement since this morning. 3. Diffuse hepatic steatosis. 4. Multiple small, nonobstructing bilateral renal  calculi. 5. Colonic diverticulosis. Electronically Signed   By: Claudie Revering M.D.   On: 05/15/2020 22:17   VAS [place] IVC/ILIAC (VENOUS ONLY)  Result Date: 05/11/2020 IVC/ILIAC STUDY Indications: DVT, recent COVID Limitations: Air/bowel gas.  Comparison Study: No prior study Performing Technologist: Maudry Mayhew MHA, RDMS, RVT, RDCS  Examination Guidelines: A complete evaluation includes B-mode imaging, spectral Doppler, color Doppler, and power Doppler as needed of all accessible portions of each vessel. Bilateral testing is considered an integral part of a complete examination. Limited examinations for reoccurring indications may be performed as noted.  IVC/Iliac Findings: +----------+------+--------+--------+    IVC    PatentThrombusComments +----------+------+--------+--------+ IVC Mid   patent                 +----------+------+--------+--------+ IVC Distalpatent                 +----------+------+--------+--------+  +-------------------+---------+-----------+---------+-----------+--------+         CIV        RT-PatentRT-ThrombusLT-PatentLT-ThrombusComments +-------------------+---------+-----------+---------+-----------+--------+ Common Iliac Prox                       patent                      +-------------------+---------+-----------+---------+-----------+--------+ Common Iliac Mid                        patent                      +-------------------+---------+-----------+---------+-----------+--------+ Common Iliac Distal patent              patent                      +-------------------+---------+-----------+---------+-----------+--------+  +-------------------------+---------+-----------+---------+-----------+--------+  EIV           RT-PatentRT-ThrombusLT-PatentLT-ThrombusComments +-------------------------+---------+-----------+---------+-----------+--------+ External Iliac Vein Prox                      patent                       +-------------------------+---------+-----------+---------+-----------+--------+ External Iliac Vein       patent                                          Distal                                                                    +-------------------------+---------+-----------+---------+-----------+--------+   Summary: IVC/Iliac: No evidence of thrombus in IVC and Iliac veins.  *See table(s) above for measurements and observations.  Electronically signed by Harold Barban MD on 05/11/2020 at 9:55:50 PM.    Final    DG Chest Port 1 View  Result Date: 05/10/2020 CLINICAL DATA:  Short of breath, history of COPD EXAM: PORTABLE CHEST 1 VIEW COMPARISON:  Prior chest x-ray dated yesterday FINDINGS: Stable cardiac and mediastinal contours. Marked interval increase in diffuse interstitial prominence bilaterally now with Kerley B-lines in the periphery. Findings are most consistent with worsening CHF. No large effusion or pneumothorax. No acute osseous abnormality. IMPRESSION: Increasing interstitial pulmonary edema concerning for progressive CHF. Electronically Signed   By: Jacqulynn Cadet M.D.   On: 05/10/2020 07:54   DG Chest Port 1 View  Result Date: 05/09/2020 CLINICAL DATA:  Possible sepsis Fever and cough EXAM: PORTABLE CHEST 1 VIEW COMPARISON:  04/04/2020 FINDINGS: Heart size within normal limits. No pulmonary vascular congestion. Interval improvement in aeration of the lungs with bilateral airspace opacities still remaining. IMPRESSION: Interval improvement in aeration of the lungs with bilateral opacities still remaining consistent with resolving pneumonia. Electronically Signed   By: Miachel Roux M.D.   On: 05/09/2020 12:56   VAS [place] LOWER EXTREMITY VENOUS (DVT)  Result Date: 05/11/2020  Lower Venous DVT Study Indications: Edema, Recent Covid infection, and SOB.  Comparison Study: 03/31/20 negative bilateral lower extremity venous duplex Performing Technologist: Maudry Mayhew MHA,  RDMS, RVT, RDCS  Examination Guidelines: A complete evaluation includes B-mode imaging, spectral Doppler, color Doppler, and power Doppler as needed of all accessible portions of each vessel. Bilateral testing is considered an integral part of a complete examination. Limited examinations for reoccurring indications may be performed as noted. The reflux portion of the exam is performed with the patient in reverse Trendelenburg.  +---------+---------------+---------+-----------+----------+--------------+ RIGHT    CompressibilityPhasicitySpontaneityPropertiesThrombus Aging +---------+---------------+---------+-----------+----------+--------------+ CFV      Full           Yes      Yes                                 +---------+---------------+---------+-----------+----------+--------------+ SFJ      Full                                                        +---------+---------------+---------+-----------+----------+--------------+  FV Prox  Full                                                        +---------+---------------+---------+-----------+----------+--------------+ FV Mid   Full                                                        +---------+---------------+---------+-----------+----------+--------------+ FV DistalFull                                                        +---------+---------------+---------+-----------+----------+--------------+ PFV      Full                                                        +---------+---------------+---------+-----------+----------+--------------+ POP      Full           Yes      Yes                                 +---------+---------------+---------+-----------+----------+--------------+ PTV      Full                                                        +---------+---------------+---------+-----------+----------+--------------+ PERO     Full                                                         +---------+---------------+---------+-----------+----------+--------------+ Gastroc  None                    No                   Acute          +---------+---------------+---------+-----------+----------+--------------+   +---------+---------------+---------+-----------+----------+--------------+ LEFT     CompressibilityPhasicitySpontaneityPropertiesThrombus Aging +---------+---------------+---------+-----------+----------+--------------+ CFV      Full           Yes      Yes                                 +---------+---------------+---------+-----------+----------+--------------+ SFJ      Full                                                        +---------+---------------+---------+-----------+----------+--------------+  FV Prox  None                    No                   Acute          +---------+---------------+---------+-----------+----------+--------------+ FV Mid   None                    No                   Acute          +---------+---------------+---------+-----------+----------+--------------+ FV DistalNone                    No                   Acute          +---------+---------------+---------+-----------+----------+--------------+ PFV      Full                                                        +---------+---------------+---------+-----------+----------+--------------+ POP      None                    No                   Acute          +---------+---------------+---------+-----------+----------+--------------+ PTV      None                    No                   Acute          +---------+---------------+---------+-----------+----------+--------------+ PERO     None                    No                   Acute          +---------+---------------+---------+-----------+----------+--------------+     Summary: RIGHT: - Findings consistent with acute deep vein thrombosis involving the right gastrocnemius veins. - No  cystic structure found in the popliteal fossa.  LEFT: - Findings consistent with acute deep vein thrombosis involving the left femoral vein, left popliteal vein, left posterior tibial veins, and left peroneal veins. - No cystic structure found in the popliteal fossa.  *See table(s) above for measurements and observations. Electronically signed by Harold Barban MD on 05/11/2020 at 9:56:21 PM.    Final

## 2020-05-16 NOTE — Progress Notes (Signed)
Subjective:  C/o back pain   Antibiotics:  Anti-infectives (From admission, onward)   Start     Dose/Rate Route Frequency Ordered Stop   05/15/20 2000  vancomycin (VANCOREADY) IVPB 1500 mg/300 mL  Status:  Discontinued        1,500 mg 150 mL/hr over 120 Minutes Intravenous Every 24 hours 05/14/20 0034 05/14/20 1109   05/14/20 1100  levofloxacin (LEVAQUIN) tablet 750 mg  Status:  Discontinued        750 mg Oral Daily 05/14/20 0902 05/14/20 1109   05/14/20 0130  vancomycin (VANCOREADY) IVPB 1500 mg/300 mL        1,500 mg 150 mL/hr over 120 Minutes Intravenous  Once 05/14/20 0034 05/14/20 0458   05/10/20 1700  vancomycin (VANCOREADY) IVPB 1500 mg/300 mL  Status:  Discontinued        1,500 mg 150 mL/hr over 120 Minutes Intravenous Every 24 hours 05/10/20 1613 05/12/20 1041   05/10/20 1700  piperacillin-tazobactam (ZOSYN) IVPB 3.375 g  Status:  Discontinued        3.375 g 12.5 mL/hr over 240 Minutes Intravenous Every 8 hours 05/10/20 1613 05/16/20 1144   05/10/20 1400  ceFEPIme (MAXIPIME) 2 g in sodium chloride 0.9 % 100 mL IVPB  Status:  Discontinued        2 g 200 mL/hr over 30 Minutes Intravenous Every 8 hours 05/10/20 1002 05/10/20 1108   05/10/20 1330  vancomycin (VANCOREADY) IVPB 1500 mg/300 mL  Status:  Discontinued        1,500 mg 150 mL/hr over 120 Minutes Intravenous Every 24 hours 05/09/20 1355 05/10/20 1108   05/10/20 1215  levofloxacin (LEVAQUIN) tablet 750 mg  Status:  Discontinued        750 mg Oral Daily 05/10/20 1115 05/10/20 2003   05/10/20 0100  ceFEPIme (MAXIPIME) 2 g in sodium chloride 0.9 % 100 mL IVPB  Status:  Discontinued        2 g 200 mL/hr over 30 Minutes Intravenous Every 12 hours 05/09/20 1355 05/10/20 1002   05/09/20 1230  ceFEPIme (MAXIPIME) 2 g in sodium chloride 0.9 % 100 mL IVPB        2 g 200 mL/hr over 30 Minutes Intravenous  Once 05/09/20 1227 05/09/20 1324   05/09/20 1230  vancomycin (VANCOREADY) IVPB 2000 mg/400 mL        2,000  mg 200 mL/hr over 120 Minutes Intravenous  Once 05/09/20 1228 05/09/20 1546      Medications: Scheduled Meds: . apixaban  10 mg Oral BID   Followed by  . [START ON 05/23/2020] apixaban  5 mg Oral BID  . aspirin  81 mg Oral Daily  . hydrocortisone  20 mg Oral Daily  . insulin aspart  0-15 Units Subcutaneous TID WC  . insulin aspart  0-5 Units Subcutaneous QHS  . lactose free nutrition  237 mL Oral TID WC  . melatonin  5 mg Oral QHS  . metoprolol tartrate  25 mg Oral BID  . mometasone-formoterol  2 puff Inhalation BID  . pantoprazole  40 mg Oral Daily  . [START ON 05/17/2020] predniSONE  40 mg Oral Q breakfast  . simvastatin  20 mg Oral Daily  . sodium chloride flush  3 mL Intravenous Q12H  . tamsulosin  0.4 mg Oral Daily  . umeclidinium bromide  1 puff Inhalation Daily   Continuous Infusions: . sodium chloride    . lactated ringers 100 mL/hr at 05/16/20 0542  PRN Meds:.sodium chloride, acetaminophen, acetaminophen **OR** [DISCONTINUED] acetaminophen, albuterol, lip balm, metoprolol tartrate, [DISCONTINUED] ondansetron **OR** ondansetron (ZOFRAN) IV    Objective: Weight change:   Intake/Output Summary (Last 24 hours) at 05/16/2020 1610 Last data filed at 05/16/2020 1515 Gross per 24 hour  Intake 240 ml  Output 1025 ml  Net -785 ml   Blood pressure (!) 165/83, pulse (!) 143, temperature (!) 102.9 F (39.4 C), temperature source Axillary, resp. rate (!) 25, height 6\' 2"  (1.88 m), weight 88 kg, SpO2 90 %. Temp:  [94.9 F (34.9 C)-102.9 F (39.4 C)] 102.9 F (39.4 C) (04/11 1558) Pulse Rate:  [77-143] 143 (04/11 1558) Resp:  [18-25] 25 (04/11 1558) BP: (95-165)/(63-84) 165/83 (04/11 1558) SpO2:  [90 %-100 %] 90 % (04/11 1558)  Physical Exam: Physical Exam Constitutional:      Appearance: He is well-developed.  HENT:     Head: Normocephalic and atraumatic.  Eyes:     Conjunctiva/sclera: Conjunctivae normal.  Cardiovascular:     Rate and Rhythm: Normal rate and  regular rhythm.     Heart sounds: No murmur heard. No friction rub. No gallop.   Pulmonary:     Effort: Pulmonary effort is normal. No respiratory distress.     Breath sounds: Normal breath sounds. No stridor. No wheezing or rhonchi.  Abdominal:     General: Abdomen is flat. There is no distension.     Palpations: Abdomen is soft. There is no mass.  Musculoskeletal:        General: Normal range of motion.     Cervical back: Normal range of motion and neck supple.  Skin:    General: Skin is warm and dry.     Coloration: Skin is pale.     Findings: No erythema or rash.  Neurological:     General: No focal deficit present.     Mental Status: He is alert and oriented to person, place, and time.  Psychiatric:        Mood and Affect: Mood normal.        Behavior: Behavior normal.        Thought Content: Thought content normal.        Judgment: Judgment normal.      CBC:    BMET Recent Labs    05/15/20 0637 05/16/20 0057  NA 137 133*  K 4.1 3.6  CL 104 104  CO2 23 25  GLUCOSE 103* 202*  BUN 14 21  CREATININE 1.01 0.97  CALCIUM 9.7 9.1     Liver Panel  Recent Labs    05/14/20 0010 05/15/20 0637  PROT 5.2* 5.5*  ALBUMIN 2.9* 2.7*  AST 33 39  ALT 19 17  ALKPHOS 78 70  BILITOT 1.1 1.3*       Sedimentation Rate Recent Labs    05/15/20 0637  ESRSEDRATE 58*   C-Reactive Protein Recent Labs    05/14/20 0010 05/15/20 0637  CRP 5.7* 18.0*    Micro Results: Recent Results (from the past 720 hour(s))  Urine culture     Status: None   Collection Time: 05/09/20  2:07 AM   Specimen: In/Out Cath Urine  Result Value Ref Range Status   Specimen Description IN/OUT CATH URINE  Final   Special Requests NONE  Final   Culture   Final    NO GROWTH Performed at [facility], [address], [place], [place] [address]    Report Status 05/11/2020 FINAL  Final  Resp Panel by RT-PCR (  Flu A&B, Covid) Nasopharyngeal Swab     Status: Abnormal   Collection  Time: 05/09/20 12:12 PM   Specimen: Nasopharyngeal Swab; Nasopharyngeal(NP) swabs in vial transport medium  Result Value Ref Range Status   SARS Coronavirus 2 by RT PCR POSITIVE (A) NEGATIVE Final    Comment: RESULT CALLED TO, READ BACK BY AND VERIFIED WITH: RN P [facility] [id number] AT 3662 BY CM (NOTE) SARS-CoV-2 target nucleic acids are DETECTED.  The SARS-CoV-2 RNA is generally detectable in upper respiratory specimens during the acute phase of infection. Positive results are indicative of the presence of the identified virus, but do not rule out bacterial infection or co-infection with other pathogens not detected by the test. Clinical correlation with patient history and other diagnostic information is necessary to determine patient infection status. The expected result is Negative.  Fact Sheet for Patients: EntrepreneurPulse.com.au  Fact Sheet for Healthcare Providers: IncredibleEmployment.be  This test is not yet approved or cleared by the [place] FDA and  has been authorized for detection and/or diagnosis of SARS-CoV-2 by FDA under an Emergency Use Authorization (EUA).  This EUA will remain in effect (meaning this test can be u sed) for the duration of  the COVID-19 declaration under Section 564(b)(1) of the Act, 21 U.S.C. section 360bbb-3(b)(1), unless the authorization is terminated or revoked sooner.     Influenza A by PCR NEGATIVE NEGATIVE Final   Influenza B by PCR NEGATIVE NEGATIVE Final    Comment: (NOTE) The Xpert Xpress SARS-CoV-2/FLU/RSV plus assay is intended as an aid in the diagnosis of influenza from Nasopharyngeal swab specimens and should not be used as a sole basis for treatment. Nasal washings and aspirates are unacceptable for Xpert Xpress SARS-CoV-2/FLU/RSV testing.  Fact Sheet for Patients: EntrepreneurPulse.com.au  Fact Sheet for Healthcare  Providers: IncredibleEmployment.be  This test is not yet approved or cleared by the [place] FDA and has been authorized for detection and/or diagnosis of SARS-CoV-2 by FDA under an Emergency Use Authorization (EUA). This EUA will remain in effect (meaning this test can be used) for the duration of the COVID-19 declaration under Section 564(b)(1) of the Act, 21 U.S.C. section 360bbb-3(b)(1), unless the authorization is terminated or revoked.  Performed at [facility], [place] [address] [place], [place] [address]   Blood Culture (routine x 2)     Status: None   Collection Time: 05/09/20 12:12 PM   Specimen: BLOOD  Result Value Ref Range Status   Specimen Description BLOOD SITE NOT SPECIFIED  Final   Special Requests   Final    BOTTLES DRAWN AEROBIC AND ANAEROBIC Blood Culture adequate volume   Culture   Final    NO GROWTH 5 DAYS Performed at [facility], [address], [place], [place] [address]    Report Status 05/14/2020 FINAL  Final  Blood Culture (routine x 2)     Status: None   Collection Time: 05/09/20 12:12 PM   Specimen: BLOOD  Result Value Ref Range Status   Specimen Description BLOOD BLOOD LEFT HAND  Final   Special Requests   Final    BOTTLES DRAWN AEROBIC AND ANAEROBIC Blood Culture adequate volume   Culture   Final    NO GROWTH 5 DAYS Performed at [facility], [place] [address], [place], [place] [address]    Report Status 05/14/2020 FINAL  Final  MRSA PCR Screening     Status: Abnormal   Collection Time: 05/09/20 12:30 PM   Specimen: Nasal Mucosa;  Nasopharyngeal  Result Value Ref Range Status   MRSA by PCR POSITIVE (A) NEGATIVE Final    Comment:        The GeneXpert MRSA Assay (FDA approved for NASAL specimens only), is one component of a comprehensive MRSA colonization surveillance program. It is not intended to diagnose MRSA infection nor to guide or monitor treatment for MRSA infections. RESULT CALLED TO, READ  BACK BY AND VERIFIED WITH: IDOL,K RN AT 2212 05/09/2020 MITCHELL,L Performed at [facility], [place] [address], [place], [place] [address]   Culture, blood (routine x 2)     Status: None (Preliminary result)   Collection Time: 05/13/20 11:07 AM   Specimen: BLOOD RIGHT HAND  Result Value Ref Range Status   Specimen Description BLOOD RIGHT HAND  Final   Special Requests   Final    BOTTLES DRAWN AEROBIC AND ANAEROBIC Blood Culture adequate volume   Culture   Final    NO GROWTH 3 DAYS Performed at [facility], [place] [address] [place], [place] [address]    Report Status PENDING  Incomplete  Culture, blood (routine x 2)     Status: None (Preliminary result)   Collection Time: 05/13/20 11:13 AM   Specimen: BLOOD LEFT HAND  Result Value Ref Range Status   Specimen Description BLOOD LEFT HAND  Final   Special Requests   Final    BOTTLES DRAWN AEROBIC AND ANAEROBIC Blood Culture adequate volume   Culture   Final    NO GROWTH 3 DAYS Performed at [facility], [place] [address] [place], [place] [address]    Report Status PENDING  Incomplete    Studies/Results: CT ABDOMEN PELVIS W CONTRAST  Result Date: 05/15/2020 CLINICAL DATA:  Sepsis.  Fever of unknown origin. EXAM: CT ABDOMEN AND PELVIS WITH CONTRAST TECHNIQUE: Multidetector CT imaging of the abdomen and pelvis was performed using the standard protocol following bolus administration of intravenous contrast. CONTRAST:  117mL OMNIPAQUE IOHEXOL 300 MG/ML  SOLN COMPARISON:  04/02/2020.  Chest CTA obtained earlier today. FINDINGS: Lower chest: Progressive pulmonary embolism on the right, with emboli currently demonstrated in the distal right main pulmonary artery and significantly more clot in the intersegmental and proximal right lower lobe pulmonary arteries. Bilateral lung densities and atelectasis with improvement since this morning. Hepatobiliary: Diffuse low density of the liver relative to the spleen. Normal appearing  gallbladder. Pancreas: Unremarkable. No pancreatic ductal dilatation or surrounding inflammatory changes. Spleen: Normal in size without focal abnormality. Adrenals/Urinary Tract: Normal appearing adrenal glands. Bilateral renal cysts. Multiple small bilateral renal calculi. No bladder or ureteral calculi are seen. Excreted contrast in the urinary bladder from the CTA earlier today. Stomach/Bowel: Multiple colonic diverticula without evidence of diverticulitis. Unremarkable stomach, small bowel and appendix. Vascular/Lymphatic: Atheromatous arterial calcifications without aneurysm. No enlarged lymph nodes. Two renal arteries on the left. Reproductive: Mildly enlarged prostate gland. Other: Small bilateral inguinal hernias containing fat and very small umbilical artery containing fat. Musculoskeletal: Stable lumbar and lower thoracic spine degenerative changes and fusion of the L1 and L2 vertebral bodies. IMPRESSION: 1. Progressive pulmonary embolism on the right, with emboli currently demonstrated in the distal right main pulmonary artery and significantly more clot in the intersegmental and proximal right lower lobe pulmonary arteries. These results will be called to the ordering clinician or representative by the Radiologist Assistant, and communication documented in the PACS or [employer]. 2. Lung densities and atelectasis with improvement since this morning. 3. Diffuse hepatic steatosis. 4. Multiple  small, nonobstructing bilateral renal calculi. 5. Colonic diverticulosis. Electronically Signed   By: Claudie Revering M.D.   On: 05/15/2020 22:17      Assessment/Plan:  INTERVAL HISTORY:   Patient seemed completely recovered when Terri Piedra and I saw him this morning   Principal Problem:   Sepsis due to pneumonia [facility]) Active Problems:   Hyperlipidemia   BPH (benign prostatic hyperplasia)   Sepsis ([place])   Stage 3b chronic kidney disease ([place])   Post-COVID-19 syndrome manifesting as chronic  cough   COPD without exacerbation ([place])   FUO (fever of unknown origin)    Tyrone Roman is a 75 y.o. male with past medical significant for rheumatoid arthritis coronary disease, COPD BPH prior lumbar surgery with herniated nucleus pulposis diverticulosis COVID-19 infection in January with multiple hospitalizations.  He was admitted this time for acute hypoxic respiratory failure to be due to acute on chronic congestive heart failure.  He has now been found to have a deep venous thrombosis and pulmonary embolism.  He has had fevers despite broad-spectrum antibiotics in the form of vancomycin and Zosyn.  I dc vancomycin without a clear target for this antibiotic  He had CT abdomen and pelvis which did not show significant intraabdominal pathology but progression of his PE.   I dc'd zosyn yesterday  He received high dose hydrocortisone yesterday and this am appeared more lucid than I have ever seen him.  I thought he would be DC likely next few days and he was asking about this  However this afternoon I was called by Dr. Landis Gandy and the patient is yet again febrile and encephalopathic.  We considered whether this could be due to persistent COVID infection, replication but recent COVID PCR was negative. He does not have antibodies despite 3 vaccines and infection and until a few days ago persistently positive COVID PCR  I am wondering if he could have a vasculitis that temporarily responded to high dose steroids and then worsened again with being dropped back to oral prednisone 20 mg.  I am recommending that we get MRI of the brain  We had contemplated LP but given he has extensive progressive PE and on anticoagulation this is not trivial to do  I also think it is worthwhile starting high dose steroids to see if the patient somehow has a vasculitis that is steroid responsive.   If he normalizes on high dose steroids and fevers do not return then I would think that would be the  case  Would ask Neurology to see him as well  He may need to be transferred to tertiary care center with Rheumatology Inpatient Consultative services which we lack  I spent greater than 35 minutes with the patient including greater than 50% of time in face to face counsel of the patient and in coordination of his care with hospitalist service.       LOS: 7 days   Alcide Evener 05/16/2020, 4:10 PM

## 2020-05-16 NOTE — Care Management (Signed)
Case management placed a benefit's check and cost for Apixaban 10 mg po BID for 30 day supply and Apixaban 5 mg by mouth BID for 30 day supply.

## 2020-05-16 NOTE — Progress Notes (Signed)
ANTICOAGULATION CONSULT NOTE - Initial Consult  Pharmacy Consult for Heparin Indication: DVT; PE  Allergies  Allergen Reactions  . Cephalexin Shortness Of Breath, Rash and Other (See Comments)    Tolerated Augmentin 2015 and 2017 (12-2017). Tolerated nafcillin 12/2017 PATIENT HAS HAD A PCN REACTION WITH IMMEDIATE RASH, FACIAL/TONGUE/THROAT SWELLING, SOB, OR LIGHTHEADEDNESS WITH HYPOTENSION:  #  #  YES  #  #  Has patient had a PCN reaction causing severe rash involving mucus membranes or skin necrosis: No Has patient had a PCN reaction that required hospitalization: No Has patient had a PCN reaction occurring within the last 10 years: No If all of the above answers are "NO", then may proceed  . Clarithromycin Shortness Of Breath and Rash  . Certolizumab Pegol Hives  . Tocilizumab Hives  . Doxycycline Rash  . Hydroxyzine Rash  . Lidoderm Other (See Comments)    "made me act weird"  . Pyrithione Zinc Rash    Patient Measurements: Height: 6\' 2"  (188 cm) Weight: 88 kg (194 lb 0.1 oz) IBW/kg (Calculated) : 82.2  Heparin Dosing Wt: 88kg   Vital Signs: Temp: 100.5 F (38.1 C) (04/11 1700) Temp Source: Axillary (04/11 1700) BP: 119/70 (04/11 1700) Pulse Rate: 128 (04/11 1700)  Labs: Recent Labs    05/14/20 0010 05/14/20 1628 05/15/20 0637 05/16/20 0057  HGB 9.0*  --  10.5* 9.5*  HCT 28.2*  --  33.8* 30.2*  PLT 242  --  275 233  CREATININE 1.31*  --  1.01 0.97  CKTOTAL  --  27*  --   --     Estimated Creatinine Clearance: 77.7 mL/min (by C-G formula based on SCr of 0.97 mg/dL).   Medical History: Past Medical History:  Diagnosis Date  . Allergy   . Barrett's esophageal ulceration   . BPH (benign prostatic hyperplasia)   . CAD (coronary artery disease)    sees Dr. Mar Daring  . Cataract    both eyes removed   . Colonic polyp   . Concussion with loss of consciousness of 30 minutes or less   . Contact dermatitis   . Contact with or exposure to venereal diseases    . COPD (chronic obstructive pulmonary disease) ([place])    sees Dr. Kara Mead   . Diverticulosis of colon   . Dysphagia   . GERD (gastroesophageal reflux disease)    past hx- on meds   . HNP (herniated nucleus pulposus), lumbar    recurrent  . Hyperlipidemia   . Hypertension   . Myocardial infarction [facility])    2011- stent placed  . Nephrolithiasis   . Rheumatoid arthritis [facility])    sees Dr. Gavin Pound   . SOB (shortness of breath)     Assessment: 75 yo male admitted on 05/09/2020 with tachycardia. D-dimer elevated. Lower extremity doppler founds DVT involving the left femoral vein, left popliteal vein, left posterior tibial veins, and left peroneal vein. CTA found progressive PE. Patient has been receiving therapeutic enoxaparin since 4/7. Initial plan was to transition to apixaban 4/12 however, MD now considering LP.  Pharmacy consulted to initiate heparin.  Last dose of enoxaparin 90mg  at ~0800 on 4/11   Goal of Therapy:  Monitor platelets by anticoagulation protocol: Yes   Plan:  Start heparin at 1400 units/hr Heparin level in 8 hours. Daily heparin level and CBC.  [employer], Pharm.D., BCPS Clinical Pharmacist  **Pharmacist phone directory can be found on [email or web address] listed under [place].  05/16/2020 5:50 PM

## 2020-05-16 NOTE — Care Management (Cosign Needed)
    Durable Medical Equipment  (From admission, onward)         Start     Ordered   05/16/20 1503  For home use only DME lightweight manual wheelchair with seat cushion  Once       Comments: Patient suffers from pneumonia, hypoxia which impairs their ability to perform daily activities like [id number] in the home.  A walking [id number] will not resolve  issue with performing activities of daily living. A wheelchair will allow patient to safely perform daily activities. Patient is not able to propel themselves in the home using a standard weight wheelchair due to [id number]. Patient can self propel in the lightweight wheelchair. Length of need 6 months. Accessories: elevating leg rests (ELRs), wheel locks, extensions and anti-tippers.   05/16/20 1504   05/16/20 1502  For home use only DME Hospital bed  Once       Question Answer Comment  Length of Need 6 Months   Patient has (list medical condition): tachycardia, hypoxia, pneumonia, generalized weakness   The above medical condition requires: Patient requires the ability to reposition frequently   Head must be elevated greater than: 30 degrees   Bed type Semi-electric   Trapeze Bar Yes   Support Surface: Gel Overlay      05/16/20 1504   05/16/20 1500  For home use only DME Walker rolling  Once       Question Answer Comment  Walker: With [place]   Patient needs a walker to treat with the following condition Pneumonia   Patient needs a walker to treat with the following condition Hypoxia      05/16/20 1504   05/16/20 1435  For home use only DME oxygen  Once       Question Answer Comment  Length of Need 6 Months   Mode or (Route) Nasal cannula   Liters per Minute 2   Oxygen conserving device Yes   Oxygen delivery system Gas      05/16/20 1434

## 2020-05-16 NOTE — Progress Notes (Signed)
PT Cancellation Note  Patient Details Name: HANCE CASPERS MRN: [id number] DOB: 10/21/1945   Cancelled Treatment:    Reason Eval/Treat Not Completed: Medical issues which prohibited therapy  Pt resting in bed with HR 140's upon arrival.   RN reports had been given meds for HR but still elevated.  Pt red in face and trembling.  Notified RN who was going into room.  Will hold PT today. Abran Richard, PT Acute Rehab Services Pager [phone] [facility] [place] 05/16/2020, 4:33 PM

## 2020-05-16 NOTE — TOC Progression Note (Signed)
Transition of Care [facility]) - Progression Note    Patient Details  Name: Tyrone Roman MRN: [id number] Date of Birth: [birth year]  Transition of Care [facility]) CM/SW [place], RN Phone Number: 05/16/2020, 3:08 PM  Clinical Narrative:    Case management called and spoke with the patient's daughter, [name], on the phone regarding transitions of care to home.  The daughter asked about private duty sitter and Percell Locus, MSW to call [facility] to have them reach out to the daughter about private pay to add to the home health services through [facility].  CM explained to the daughter about discharge tomorrow to home and the daughter is requesting hospital bed with trapeze, rolling walker (private pay), home oxygen, and wheelchair.  I called and spoke with Sue Lush, CM at [employer] and she will follow up with the family to schedule delivery of the equipment to the home for pending discharge tomorrow.  Percell Locus, MSW is aware and will follow up with family tomorrow regarding Home health and dme needs for home.   Expected Discharge Plan: [place] Barriers to Discharge: Continued Medical Work up  Expected Discharge Plan and Services Expected Discharge Plan: [place]   Discharge Planning Services: CM Consult Post Acute Care Choice: [place] Living arrangements for the past 2 months: [place]: RN,PT,OT [place] Agency: [place] Date [place]: 05/11/20 Time [place]: 9449 Representative spoke with at [place]: [place] ([place]) Interventions    Readmission Risk Interventions No flowsheet data found.

## 2020-05-16 NOTE — Progress Notes (Signed)
ANTICOAGULATION CONSULT NOTE - Initial Consult  Pharmacy Consult for Enoxaparin > Apixaban Indication: DVT; PE  Allergies  Allergen Reactions  . Cephalexin Shortness Of Breath, Rash and Other (See Comments)    Tolerated Augmentin 2015 and 2017 (12-2017). Tolerated nafcillin 12/2017 PATIENT HAS HAD A PCN REACTION WITH IMMEDIATE RASH, FACIAL/TONGUE/THROAT SWELLING, SOB, OR LIGHTHEADEDNESS WITH HYPOTENSION:  #  #  YES  #  #  Has patient had a PCN reaction causing severe rash involving mucus membranes or skin necrosis: No Has patient had a PCN reaction that required hospitalization: No Has patient had a PCN reaction occurring within the last 10 years: No If all of the above answers are "NO", then may proceed  . Clarithromycin Shortness Of Breath and Rash  . Certolizumab Pegol Hives  . Tocilizumab Hives  . Doxycycline Rash  . Hydroxyzine Rash  . Lidoderm Other (See Comments)    "made me act weird"  . Pyrithione Zinc Rash    Patient Measurements: Height: 6\' 2"  (188 cm) Weight: 88 kg (194 lb 0.1 oz) IBW/kg (Calculated) : 82.2   Vital Signs: Temp: 97.4 F (36.3 C) (04/11 0743) Temp Source: Oral (04/11 0743) BP: 143/84 (04/11 0743) Pulse Rate: 86 (04/11 0743)  Labs: Recent Labs    05/14/20 0010 05/14/20 1628 05/15/20 0637 05/16/20 0057  HGB 9.0*  --  10.5* 9.5*  HCT 28.2*  --  33.8* 30.2*  PLT 242  --  275 233  CREATININE 1.31*  --  1.01 0.97  CKTOTAL  --  27*  --   --     Estimated Creatinine Clearance: 77.7 mL/min (by C-G formula based on SCr of 0.97 mg/dL).   Medical History: Past Medical History:  Diagnosis Date  . Allergy   . Barrett's esophageal ulceration   . BPH (benign prostatic hyperplasia)   . CAD (coronary artery disease)    sees Dr. Mar Daring  . Cataract    both eyes removed   . Colonic polyp   . Concussion with loss of consciousness of 30 minutes or less   . Contact dermatitis   . Contact with or exposure to venereal diseases   . COPD (chronic  obstructive pulmonary disease) ([place])    sees Dr. Kara Mead   . Diverticulosis of colon   . Dysphagia   . GERD (gastroesophageal reflux disease)    past hx- on meds   . HNP (herniated nucleus pulposus), lumbar    recurrent  . Hyperlipidemia   . Hypertension   . Myocardial infarction [facility])    2011- stent placed  . Nephrolithiasis   . Rheumatoid arthritis [facility])    sees Dr. Gavin Pound   . SOB (shortness of breath)     Assessment: 75 yo male admitted on 05/09/2020 with tachycardia. D-dimer elevated. Lower extremity doppler founds DVT involving the left femoral vein, left popliteal vein, left posterior tibial veins, and left peroneal vein. CTA found progressive PE. Patient has been receiving therapeutic enoxaparin since 4/7. Pharmacy consulted to transition to apixaban.  Last dose of enoxaparin 90mg  at ~0800 on 4/11   Goal of Therapy:  Monitor platelets by anticoagulation protocol: Yes   Plan:  Start apixaban 10mg  twice daily at [place] (when next dose of enoxaparin due) for 7 days followed by 5mg  twice daily  Pharmacy to provide education  Case management consult for cost  Cristela Felt, PharmD Clinical Pharmacist  05/16/2020,9:39 AM

## 2020-05-16 NOTE — TOC Benefit Eligibility Note (Signed)
Transition of Care [facility]) Benefit Eligibility Note    Patient Details  Name: Tyrone Roman MRN: [id number] Date of Birth: Jul 21, 1945   Medication/Dose: Eliquis 5mg . bid 30 day supply ( Apixaban no on formulary.) ( Eliquis only comes in 2.5mg  and 5mg .)  Covered?: Yes  Tier: 3 Drug  Prescription Coverage [facility]  Spoke with Person/Company/Phone Number:: [place] [employer], Ph# [phone]  Co-Pay: $521.69  Prior Approval: No  Deductible: Unmet       Shelda Altes Phone Number: 05/16/2020, 2:40 PM

## 2020-05-16 NOTE — Progress Notes (Signed)
SATURATION QUALIFICATIONS: (This note is used to comply with regulatory documentation for home oxygen)  Patient Saturations on Room Air at Rest = 87%  Patient Saturations on 3 Liters of oxygen while Ambulating (movement in bed) = 92%  Please briefly explain why patient needs home oxygen: Patient desaturates on RA while at rest and with movement.

## 2020-05-17 ENCOUNTER — Inpatient Hospital Stay (HOSPITAL_COMMUNITY): Payer: Medicare Other

## 2020-05-17 DIAGNOSIS — I2699 Other pulmonary embolism without acute cor pulmonale: Secondary | ICD-10-CM

## 2020-05-17 DIAGNOSIS — A419 Sepsis, unspecified organism: Secondary | ICD-10-CM | POA: Diagnosis not present

## 2020-05-17 DIAGNOSIS — R509 Fever, unspecified: Secondary | ICD-10-CM | POA: Diagnosis not present

## 2020-05-17 DIAGNOSIS — J449 Chronic obstructive pulmonary disease, unspecified: Secondary | ICD-10-CM | POA: Diagnosis not present

## 2020-05-17 DIAGNOSIS — R29898 Other symptoms and signs involving the musculoskeletal system: Secondary | ICD-10-CM

## 2020-05-17 DIAGNOSIS — J189 Pneumonia, unspecified organism: Secondary | ICD-10-CM | POA: Diagnosis not present

## 2020-05-17 DIAGNOSIS — I82409 Acute embolism and thrombosis of unspecified deep veins of unspecified lower extremity: Secondary | ICD-10-CM | POA: Diagnosis not present

## 2020-05-17 LAB — CBC
HCT: 31 % — ABNORMAL LOW (ref 39.0–52.0)
Hemoglobin: 9.5 g/dL — ABNORMAL LOW (ref 13.0–17.0)
MCH: 27.5 pg (ref 26.0–34.0)
MCHC: 30.6 g/dL (ref 30.0–36.0)
MCV: 89.9 fL (ref 80.0–100.0)
Platelets: 223 10*3/uL (ref 150–400)
RBC: 3.45 MIL/uL — ABNORMAL LOW (ref 4.22–5.81)
RDW: 18.9 % — ABNORMAL HIGH (ref 11.5–15.5)
WBC: 4.8 10*3/uL (ref 4.0–10.5)
nRBC: 0 % (ref 0.0–0.2)

## 2020-05-17 LAB — COMPREHENSIVE METABOLIC PANEL
ALT: 13 U/L (ref 0–44)
AST: 26 U/L (ref 15–41)
Albumin: 2.2 g/dL — ABNORMAL LOW (ref 3.5–5.0)
Alkaline Phosphatase: 64 U/L (ref 38–126)
Anion gap: 7 (ref 5–15)
BUN: 15 mg/dL (ref 8–23)
CO2: 24 mmol/L (ref 22–32)
Calcium: 9.2 mg/dL (ref 8.9–10.3)
Chloride: 105 mmol/L (ref 98–111)
Creatinine, Ser: 0.91 mg/dL (ref 0.61–1.24)
GFR, Estimated: >60 mL/min (ref >60)
Glucose, Bld: 224 mg/dL — ABNORMAL HIGH (ref 70–99)
Potassium: 3.9 mmol/L (ref 3.5–5.1)
Sodium: 136 mmol/L (ref 135–145)
Total Bilirubin: 0.9 mg/dL (ref 0.3–1.2)
Total Protein: 4.6 g/dL — ABNORMAL LOW (ref 6.5–8.1)

## 2020-05-17 LAB — SEDIMENTATION RATE: Sed Rate: 63 mm/hr — ABNORMAL HIGH (ref 0–16)

## 2020-05-17 LAB — PROTEIN ELECTROPHORESIS, SERUM
A/G Ratio: 1.2 (ref 0.7–1.7)
Albumin ELP: 3 g/dL (ref 2.9–4.4)
Alpha-1-Globulin: 0.3 g/dL (ref 0.0–0.4)
Alpha-2-Globulin: 1.1 g/dL — ABNORMAL HIGH (ref 0.4–1.0)
Beta Globulin: 0.7 g/dL (ref 0.7–1.3)
Gamma Globulin: 0.3 g/dL — ABNORMAL LOW (ref 0.4–1.8)
Globulin, Total: 2.5 g/dL (ref 2.2–3.9)
Total Protein ELP: 5.5 g/dL — ABNORMAL LOW (ref 6.0–8.5)

## 2020-05-17 LAB — GLUCOSE, CAPILLARY
Glucose-Capillary: 173 mg/dL — ABNORMAL HIGH (ref 70–99)
Glucose-Capillary: 200 mg/dL — ABNORMAL HIGH (ref 70–99)
Glucose-Capillary: 276 mg/dL — ABNORMAL HIGH (ref 70–99)
Glucose-Capillary: 253 mg/dL — ABNORMAL HIGH (ref 70–99)

## 2020-05-17 LAB — LEGIONELLA PNEUMOPHILA SEROGP 1 UR AG: L. pneumophila Serogp 1 Ur Ag: NEGATIVE

## 2020-05-17 LAB — C-REACTIVE PROTEIN: CRP: 9 mg/dL — ABNORMAL HIGH (ref <1.0)

## 2020-05-17 LAB — HEPARIN LEVEL (UNFRACTIONATED): Heparin Unfractionated: 0.95 IU/mL — ABNORMAL HIGH (ref 0.30–0.70)

## 2020-05-17 LAB — PSA: Prostatic Specific Antigen: 0.38 ng/mL (ref 0.00–4.00)

## 2020-05-17 MED ORDER — ALUM & MAG HYDROXIDE-SIMETH 200-200-20 MG/5ML PO SUSP: 30.0000 mL | Freq: Four times a day (QID) | ORAL | Status: DC | PRN

## 2020-05-17 MED ORDER — HYDROCODONE-ACETAMINOPHEN 5-325 MG PO TABS: 1.0000 | ORAL_TABLET | Freq: Four times a day (QID) | ORAL | Status: DC | PRN

## 2020-05-17 MED ORDER — HYDROMORPHONE HCL 1 MG/ML IJ SOLN
1.0000 mg | Freq: Once | INTRAMUSCULAR | Status: AC
  Administered 2020-05-17: 1 mg via INTRAVENOUS
  Filled 2020-05-17: qty 1

## 2020-05-17 MED ORDER — ENOXAPARIN SODIUM 100 MG/ML ~~LOC~~ SOLN
90.0000 mg | Freq: Two times a day (BID) | SUBCUTANEOUS | Status: DC
  Administered 2020-05-17 (×2): 90 mg via SUBCUTANEOUS
  Filled 2020-05-17 (×2): qty 1

## 2020-05-17 MED ORDER — PREDNISONE 5 MG PO TABS
30.0000 mg | ORAL_TABLET | Freq: Two times a day (BID) | ORAL | Status: DC
  Administered 2020-05-17 – 2020-05-18 (×3): 30 mg via ORAL
  Filled 2020-05-17: qty 1
  Filled 2020-05-17: qty 2
  Filled 2020-05-17: qty 1

## 2020-05-17 NOTE — Progress Notes (Signed)
Subjective:  No new complaints   Antibiotics:  Anti-infectives (From admission, onward)   Start     Dose/Rate Route Frequency Ordered Stop   05/15/20 2000  vancomycin (VANCOREADY) IVPB 1500 mg/300 mL  Status:  Discontinued        1,500 mg 150 mL/hr over 120 Minutes Intravenous Every 24 hours 05/14/20 0034 05/14/20 1109   05/14/20 1100  levofloxacin (LEVAQUIN) tablet 750 mg  Status:  Discontinued        750 mg Oral Daily 05/14/20 0902 05/14/20 1109   05/14/20 0130  vancomycin (VANCOREADY) IVPB 1500 mg/300 mL        1,500 mg 150 mL/hr over 120 Minutes Intravenous  Once 05/14/20 0034 05/14/20 0458   05/10/20 1700  vancomycin (VANCOREADY) IVPB 1500 mg/300 mL  Status:  Discontinued        1,500 mg 150 mL/hr over 120 Minutes Intravenous Every 24 hours 05/10/20 1613 05/12/20 1041   05/10/20 1700  piperacillin-tazobactam (ZOSYN) IVPB 3.375 g  Status:  Discontinued        3.375 g 12.5 mL/hr over 240 Minutes Intravenous Every 8 hours 05/10/20 1613 05/16/20 1144   05/10/20 1400  ceFEPIme (MAXIPIME) 2 g in sodium chloride 0.9 % 100 mL IVPB  Status:  Discontinued        2 g 200 mL/hr over 30 Minutes Intravenous Every 8 hours 05/10/20 1002 05/10/20 1108   05/10/20 1330  vancomycin (VANCOREADY) IVPB 1500 mg/300 mL  Status:  Discontinued        1,500 mg 150 mL/hr over 120 Minutes Intravenous Every 24 hours 05/09/20 1355 05/10/20 1108   05/10/20 1215  levofloxacin (LEVAQUIN) tablet 750 mg  Status:  Discontinued        750 mg Oral Daily 05/10/20 1115 05/10/20 2003   05/10/20 0100  ceFEPIme (MAXIPIME) 2 g in sodium chloride 0.9 % 100 mL IVPB  Status:  Discontinued        2 g 200 mL/hr over 30 Minutes Intravenous Every 12 hours 05/09/20 1355 05/10/20 1002   05/09/20 1230  ceFEPIme (MAXIPIME) 2 g in sodium chloride 0.9 % 100 mL IVPB        2 g 200 mL/hr over 30 Minutes Intravenous  Once 05/09/20 1227 05/09/20 1324   05/09/20 1230  vancomycin (VANCOREADY) IVPB 2000 mg/400 mL        2,000  mg 200 mL/hr over 120 Minutes Intravenous  Once 05/09/20 1228 05/09/20 1546      Medications: Scheduled Meds: . aspirin  81 mg Oral Daily  . enoxaparin (LOVENOX) injection  90 mg Subcutaneous Q12H  . insulin aspart  0-15 Units Subcutaneous TID WC  . insulin aspart  0-5 Units Subcutaneous QHS  . lactose free nutrition  237 mL Oral TID WC  . melatonin  5 mg Oral QHS  . metoprolol tartrate  25 mg Oral BID  . mometasone-formoterol  2 puff Inhalation BID  . pantoprazole  40 mg Oral Daily  . predniSONE  30 mg Oral Q12H  . simvastatin  20 mg Oral Daily  . sodium chloride flush  3 mL Intravenous Q12H  . tamsulosin  0.4 mg Oral Daily  . umeclidinium bromide  1 puff Inhalation Daily   Continuous Infusions: . sodium chloride    . lactated ringers 100 mL/hr at 05/17/20 0338   PRN Meds:.sodium chloride, acetaminophen, acetaminophen **OR** [DISCONTINUED] acetaminophen, albuterol, lip balm, metoprolol tartrate, [DISCONTINUED] ondansetron **OR** ondansetron (ZOFRAN) IV    Objective: Weight  change:   Intake/Output Summary (Last 24 hours) at 05/17/2020 1434 Last data filed at 05/17/2020 1024 Gross per 24 hour  Intake 5417.16 ml  Output 1301 ml  Net 4116.16 ml   Blood pressure 127/83, pulse 87, temperature (!) 97.4 F (36.3 C), temperature source Oral, resp. rate 20, height 6\' 2"  (1.88 m), weight 88 kg, SpO2 92 %. Temp:  [97.4 F (36.3 C)-102.9 F (39.4 C)] 97.4 F (36.3 C) (04/12 1249) Pulse Rate:  [71-143] 87 (04/12 1249) Resp:  [18-25] 20 (04/12 1249) BP: (109-165)/(61-89) 127/83 (04/12 1249) SpO2:  [90 %-100 %] 92 % (04/12 1249)  Physical Exam: Physical Exam Constitutional:      Appearance: He is well-developed.  HENT:     Head: Normocephalic and atraumatic.  Eyes:     Conjunctiva/sclera: Conjunctivae normal.  Cardiovascular:     Rate and Rhythm: Normal rate and regular rhythm.     Heart sounds: No murmur heard. No friction rub. No gallop.   Pulmonary:     Effort:  Pulmonary effort is normal. No respiratory distress.     Breath sounds: Normal breath sounds. No stridor. No wheezing or rhonchi.  Abdominal:     General: Abdomen is flat. There is no distension.     Palpations: Abdomen is soft. There is no mass.  Musculoskeletal:        General: Normal range of motion.     Cervical back: Normal range of motion and neck supple.  Skin:    General: Skin is warm and dry.     Findings: No erythema or rash.  Neurological:     General: No focal deficit present.     Mental Status: He is alert and oriented to person, place, and time.  Psychiatric:        Mood and Affect: Mood normal.        Behavior: Behavior normal.        Thought Content: Thought content normal.        Judgment: Judgment normal.      CBC:    BMET Recent Labs    05/16/20 0057 05/17/20 0201  NA 133* 136  K 3.6 3.9  CL 104 105  CO2 25 24  GLUCOSE 202* 224*  BUN 21 15  CREATININE 0.97 0.91  CALCIUM 9.1 9.2     Liver Panel  Recent Labs    05/15/20 0637 05/17/20 0201  PROT 5.5* 4.6*  ALBUMIN 2.7* 2.2*  AST 39 26  ALT 17 13  ALKPHOS 70 64  BILITOT 1.3* 0.9       Sedimentation Rate Recent Labs    05/17/20 0201  ESRSEDRATE 63*   C-Reactive Protein Recent Labs    05/15/20 0637 05/17/20 0201  CRP 18.0* 9.0*    Micro Results: Recent Results (from the past 720 hour(s))  Urine culture     Status: None   Collection Time: 05/09/20  2:07 AM   Specimen: In/Out Cath Urine  Result Value Ref Range Status   Specimen Description IN/OUT CATH URINE  Final   Special Requests NONE  Final   Culture   Final    NO GROWTH Performed at [facility], [address], [place], [place] [address]    Report Status 05/11/2020 FINAL  Final  Resp Panel by RT-PCR (Flu A&B, Covid) Nasopharyngeal Swab     Status: Abnormal   Collection Time: 05/09/20 12:12 PM   Specimen: Nasopharyngeal Swab; Nasopharyngeal(NP) swabs in vial transport medium  Result Value Ref Range Status  SARS Coronavirus 2 by RT PCR POSITIVE (A) NEGATIVE Final    Comment: RESULT CALLED TO, READ BACK BY AND VERIFIED WITH: RN P [facility] [id number] AT 2637 BY CM (NOTE) SARS-CoV-2 target nucleic acids are DETECTED.  The SARS-CoV-2 RNA is generally detectable in upper respiratory specimens during the acute phase of infection. Positive results are indicative of the presence of the identified virus, but do not rule out bacterial infection or co-infection with other pathogens not detected by the test. Clinical correlation with patient history and other diagnostic information is necessary to determine patient infection status. The expected result is Negative.  Fact Sheet for Patients: EntrepreneurPulse.com.au  Fact Sheet for Healthcare Providers: IncredibleEmployment.be  This test is not yet approved or cleared by the [place] FDA and  has been authorized for detection and/or diagnosis of SARS-CoV-2 by FDA under an Emergency Use Authorization (EUA).  This EUA will remain in effect (meaning this test can be u sed) for the duration of  the COVID-19 declaration under Section 564(b)(1) of the Act, 21 U.S.C. section 360bbb-3(b)(1), unless the authorization is terminated or revoked sooner.     Influenza A by PCR NEGATIVE NEGATIVE Final   Influenza B by PCR NEGATIVE NEGATIVE Final    Comment: (NOTE) The Xpert Xpress SARS-CoV-2/FLU/RSV plus assay is intended as an aid in the diagnosis of influenza from Nasopharyngeal swab specimens and should not be used as a sole basis for treatment. Nasal washings and aspirates are unacceptable for Xpert Xpress SARS-CoV-2/FLU/RSV testing.  Fact Sheet for Patients: EntrepreneurPulse.com.au  Fact Sheet for Healthcare Providers: IncredibleEmployment.be  This test is not yet approved or cleared by the [place] FDA and has been authorized for detection and/or diagnosis of SARS-CoV-2  by FDA under an Emergency Use Authorization (EUA). This EUA will remain in effect (meaning this test can be used) for the duration of the COVID-19 declaration under Section 564(b)(1) of the Act, 21 U.S.C. section 360bbb-3(b)(1), unless the authorization is terminated or revoked.  Performed at [facility], [place] [address], [place], [place] [address]   Blood Culture (routine x 2)     Status: None   Collection Time: 05/09/20 12:12 PM   Specimen: BLOOD  Result Value Ref Range Status   Specimen Description BLOOD SITE NOT SPECIFIED  Final   Special Requests   Final    BOTTLES DRAWN AEROBIC AND ANAEROBIC Blood Culture adequate volume   Culture   Final    NO GROWTH 5 DAYS Performed at [facility], [address], [place], [place] [address]    Report Status 05/14/2020 FINAL  Final  Blood Culture (routine x 2)     Status: None   Collection Time: 05/09/20 12:12 PM   Specimen: BLOOD  Result Value Ref Range Status   Specimen Description BLOOD BLOOD LEFT HAND  Final   Special Requests   Final    BOTTLES DRAWN AEROBIC AND ANAEROBIC Blood Culture adequate volume   Culture   Final    NO GROWTH 5 DAYS Performed at [facility], [place] [address] [place], [place] [address]    Report Status 05/14/2020 FINAL  Final  MRSA PCR Screening     Status: Abnormal   Collection Time: 05/09/20 12:30 PM   Specimen: Nasal Mucosa; Nasopharyngeal  Result Value Ref Range Status   MRSA by PCR POSITIVE (A) NEGATIVE Final    Comment:        The GeneXpert MRSA Assay (FDA approved for NASAL specimens only), is  one component of a comprehensive MRSA colonization surveillance program. It is not intended to diagnose MRSA infection nor to guide or monitor treatment for MRSA infections. RESULT CALLED TO, READ BACK BY AND VERIFIED WITH: IDOL,K RN AT 2212 05/09/2020 MITCHELL,L Performed at [facility], [place] [address], [place], [place] [address]   Culture, blood (routine x 2)     Status: None  (Preliminary result)   Collection Time: 05/13/20 11:07 AM   Specimen: BLOOD RIGHT HAND  Result Value Ref Range Status   Specimen Description BLOOD RIGHT HAND  Final   Special Requests   Final    BOTTLES DRAWN AEROBIC AND ANAEROBIC Blood Culture adequate volume   Culture   Final    NO GROWTH 4 DAYS Performed at [facility], [place] [address], [place], [place] [address]    Report Status PENDING  Incomplete  Culture, blood (routine x 2)     Status: None (Preliminary result)   Collection Time: 05/13/20 11:13 AM   Specimen: BLOOD LEFT HAND  Result Value Ref Range Status   Specimen Description BLOOD LEFT HAND  Final   Special Requests   Final    BOTTLES DRAWN AEROBIC AND ANAEROBIC Blood Culture adequate volume   Culture   Final    NO GROWTH 4 DAYS Performed at [facility], [place] [address], [place], [place] [address]    Report Status PENDING  Incomplete    Studies/Results: MR BRAIN W WO CONTRAST  Result Date: 05/16/2020 CLINICAL DATA:  Delirium; fever with unknown origin, altered mental status, evaluate for encephalitis. EXAM: MRI HEAD WITHOUT AND WITH CONTRAST TECHNIQUE: Multiplanar, multiecho pulse sequences of the brain and surrounding structures were obtained without and with intravenous contrast. CONTRAST:  26mL GADAVIST GADOBUTROL 1 MMOL/ML IV SOLN COMPARISON:  Prior head CT examinations 04/02/2020 and earlier. Brain MRI 12/13/2017. FINDINGS: Brain: Mild cerebral and cerebellar atrophy. AP punctate focus of restricted diffusion is questioned within the right parietal lobe (versus artifact) (series 3, image 33). Additionally, there is apparent subtle restricted diffusion along the midline callosal splenium, slightly eccentric to the left. A few small foci of T2/FLAIR hyperintensity within the bilateral cerebral white matter and right thalamus are nonspecific, but likely reflect minimal chronic small vessel ischemic disease. Redemonstrated left retrocerebellar arachnoid cyst. There  is no acute infarct. No evidence of intracranial mass. No chronic intracranial blood products. No extra-axial fluid collection. No midline shift. No abnormal intracranial enhancement. Vascular: Expected proximal arterial flow voids. Skull and upper cervical spine: No focal marrow lesion. Incompletely assessed cervical spondylosis. Suspected C4-C5 vertebral body ankylosis. Sinuses/Orbits: Visualized orbits show no acute finding. Bilateral lens replacements. Mild paranasal sinus mucosal thickening, most notably within the bilateral ethmoid air cells. Small left maxillary sinus mucous retention cyst. Other: Right mastoid effusion. IMPRESSION: Punctate acute infarct versus artifact within the right parietal lobe. Additionally, there is apparent subtle restricted diffusion within the midline callosal splenium, slightly eccentric to the left. This may reflect a small focus of acute ischemia or artifact. Alternatively, this may reflect a cytotoxic lesion of the corpus callosum (which has a broad range of potential etiologies and clinical associations), although the appearance would be atypical for this entity. Background mild parenchymal atrophy and chronic small vessel ischemic disease. Mild paranasal sinus disease as described. Right mastoid effusion. Electronically Signed   By: Kellie Simmering DO   On: 05/16/2020 18:51   CT ABDOMEN PELVIS W CONTRAST  Result Date: 05/15/2020 CLINICAL DATA:  Sepsis.  Fever of  unknown origin. EXAM: CT ABDOMEN AND PELVIS WITH CONTRAST TECHNIQUE: Multidetector CT imaging of the abdomen and pelvis was performed using the standard protocol following bolus administration of intravenous contrast. CONTRAST:  119mL OMNIPAQUE IOHEXOL 300 MG/ML  SOLN COMPARISON:  04/02/2020.  Chest CTA obtained earlier today. FINDINGS: Lower chest: Progressive pulmonary embolism on the right, with emboli currently demonstrated in the distal right main pulmonary artery and significantly more clot in the  intersegmental and proximal right lower lobe pulmonary arteries. Bilateral lung densities and atelectasis with improvement since this morning. Hepatobiliary: Diffuse low density of the liver relative to the spleen. Normal appearing gallbladder. Pancreas: Unremarkable. No pancreatic ductal dilatation or surrounding inflammatory changes. Spleen: Normal in size without focal abnormality. Adrenals/Urinary Tract: Normal appearing adrenal glands. Bilateral renal cysts. Multiple small bilateral renal calculi. No bladder or ureteral calculi are seen. Excreted contrast in the urinary bladder from the CTA earlier today. Stomach/Bowel: Multiple colonic diverticula without evidence of diverticulitis. Unremarkable stomach, small bowel and appendix. Vascular/Lymphatic: Atheromatous arterial calcifications without aneurysm. No enlarged lymph nodes. Two renal arteries on the left. Reproductive: Mildly enlarged prostate gland. Other: Small bilateral inguinal hernias containing fat and very small umbilical artery containing fat. Musculoskeletal: Stable lumbar and lower thoracic spine degenerative changes and fusion of the L1 and L2 vertebral bodies. IMPRESSION: 1. Progressive pulmonary embolism on the right, with emboli currently demonstrated in the distal right main pulmonary artery and significantly more clot in the intersegmental and proximal right lower lobe pulmonary arteries. These results will be called to the ordering clinician or representative by the Radiologist Assistant, and communication documented in the PACS or [employer]. 2. Lung densities and atelectasis with improvement since this morning. 3. Diffuse hepatic steatosis. 4. Multiple small, nonobstructing bilateral renal calculi. 5. Colonic diverticulosis. Electronically Signed   By: Claudie Revering M.D.   On: 05/15/2020 22:17      Assessment/Plan:  INTERVAL HISTORY:   Patient seemed completely recovered when Terri Piedra and I saw him this  morning   Principal Problem:   Sepsis due to pneumonia [facility]) Active Problems:   Hyperlipidemia   BPH (benign prostatic hyperplasia)   Sepsis ([place])   Stage 3b chronic kidney disease ([place])   Post-COVID-19 syndrome manifesting as chronic cough   COPD without exacerbation (HCC)   FUO (fever of unknown origin)   Weakness of both lower extremities    Tyrone Roman is a 75 y.o. male with past medical significant for rheumatoid arthritis coronary disease, COPD BPH prior lumbar surgery with herniated nucleus pulposis diverticulosis COVID-19 infection in January with multiple hospitalizations.  He was admitted this time for acute hypoxic respiratory failure to be due to acute on chronic congestive heart failure.  He has now been found to have a deep venous thrombosis and pulmonary embolism.  He has had fevers despite broad-spectrum antibiotics in the form of vancomycin and Zosyn.  I dc vancomycin without a clear target for this antibiotic  He had CT abdomen and pelvis which did not show significant intraabdominal pathology but progression of his PE.   I dc'd zosyn on Sunday  He received high dose hydrocortisone Sunday and Monday morning appeared more lucid than I have ever seen him.  This afternoon he again developed high-grade fevers and was again encephalopathic  We considered whether this could be due to persistent COVID infection, replication but recent COVID PCR was negative. He does not have antibodies despite 3 vaccines and infection and until a few days ago persistently positive COVID PCR  I am wondering if he could have a vasculitis that temporarily responded to high dose steroids and then worsened again with being dropped back to oral prednisone 20 mg.  Obtained an MRI of the brain which showed a possible infarct though  He went back on high-dose steroids and again this morning had return to normal and could even give me much of the current events  Neurology recommending MRI  of cervical and thoracic spine which is quite reasonable given some of his findings on exam and to ensure there is no infection or other process there.   He may need to be transferred to tertiary care center with Rheumatology Inpatient Consultative services which we lack  I spent greater than 35 minutes with the patient including greater than 50% of time in face to face counsel of the patient and in coordination of his care with primary team and neurology.      LOS: 8 days   Alcide Evener 05/17/2020, 2:34 PM

## 2020-05-17 NOTE — TOC Benefit Eligibility Note (Signed)
Transition of Care [facility]) Benefit Eligibility Note    Patient Details  Name: Tyrone Roman MRN: [id number] Date of Birth: 18-May-1945   Medication/Dose: Alveda Reasons 15 MG BID  - CO-PAY  $ 484.69   Q/L  TWO PILL PER DAY   and  XARELTO  20 MG  DAILY  -CO-PAY- $484.69  Covered?: Yes  Tier: 3 Drug  Prescription Coverage [facility] [place]  and [place] with Person/Company/Phone Number:: JARED  @  [employer] #  [phone]  Co-Pay: [id number]  Prior Approval: No  Deductible: Unmet (OUT-OF-POCKET- [place] Phone Number: 05/17/2020, 5:36 PM

## 2020-05-17 NOTE — Progress Notes (Signed)
Physical Therapy Treatment Patient Details Name: Tyrone Roman MRN: [id number] DOB: Feb 27, 1945 Today's Date: 05/17/2020    History of Present Illness Pt is 75 y.o. male presenting with tachycardia and confusion and admitted on 05/09/20 with sepsis and likely PNE.  Presents from [facility] after hospitalization in February for COVID PNA.  PMH: RA; CAD s/p stent; HTN; HLD; COPD; and BPH    PT Comments    Today pt limited with activity due to incontinence and dizziness. Pt will need assistance for any mobility on return home. Pt plans to return home with daughter's support and hired caregivers.    Follow Up Recommendations  Home health PT;Supervision for mobility/OOB (pt refusing SNF)     Equipment Recommendations  Rolling walker with 5" wheels;Wheelchair (measurements PT);Hospital bed    Recommendations for Other Services       Precautions / Restrictions Precautions Precautions: Fall;Other (comment) Precaution Comments: watch BP    Mobility  Bed Mobility Overal bed mobility: Needs Assistance Bed Mobility: Supine to Sit     Supine to sit: Min assist     General bed mobility comments: Assist to bring legs off of bed and elevate trunk into sitting    Transfers Overall transfer level: Needs assistance Equipment used: Rolling walker (2 wheeled) Transfers: Sit to/from [facility] Sit to Stand: Min assist;+2 safety/equipment Stand pivot transfers: Min assist;+2 safety/equipment       General transfer comment: Assist to bring hips up and for balance. Verbal cues for hand placement. Began going from bed to chair with walker when pt incontinent of BM. Brought bsc. Pt with dizziness on BSC.  BSC to recliner.  Ambulation/Gait             General Gait Details: Unable to attempt due to incontinence of bowel and dizziness.   Stairs             Wheelchair Mobility    Modified Rankin (Stroke Patients Only)       Balance Overall balance  assessment: Needs assistance Sitting-balance support: No upper extremity supported;Feet supported Sitting balance-Leahy Scale: Fair     Standing balance support: Bilateral upper extremity supported Standing balance-Leahy Scale: Poor Standing balance comment: walker and min assist for static standing                            Cognition Arousal/Alertness: Awake/alert Behavior During Therapy: WFL for tasks assessed/performed Overall Cognitive Status: Within Functional Limits for tasks assessed                                        Exercises      General Comments General comments (skin integrity, edema, etc.): BP supine 128/72; BP sitting EOB 118/85; BP sitting in recliner after transfers 104/79      Pertinent Vitals/Pain      Home Living                      Prior Function            PT Goals (current goals can now be found in the care plan section) Acute Rehab PT Goals Patient Stated Goal: continue to get better Progress towards PT goals: Not progressing toward goals - comment    Frequency    Min 3X/week      PT Plan Discharge plan needs to be  updated;Frequency needs to be updated    Co-evaluation              AM-PAC PT "6 Clicks" Mobility   Outcome Measure  Help needed turning from your back to your side while in a flat bed without using bedrails?: A Little Help needed moving from lying on your back to sitting on the side of a flat bed without using bedrails?: A Little Help needed moving to and from a bed to a chair (including a wheelchair)?: A Little Help needed standing up from a chair using your arms (e.g., wheelchair or bedside chair)?: A Little Help needed to walk in hospital room?: Total Help needed climbing 3-5 steps with a railing? : Total 6 Click Score: 14    End of Session Equipment Utilized During Treatment: Gait belt Activity Tolerance: Other (comment) (incontinence of bowel and dizziness) Patient  left: in chair;with chair alarm set;with call bell/phone within reach Nurse Communication: Mobility status;Other (comment) (condom cath off and TED hose dirty) PT Visit Diagnosis: Unsteadiness on feet (R26.81);Other abnormalities of gait and mobility (R26.89);Muscle weakness (generalized) (M62.81);Difficulty in walking, not elsewhere classified (R26.2)     Time: 3491-7915 PT Time Calculation (min) (ACUTE ONLY): 37 min  Charges:  $Therapeutic Activity: 23-37 mins                     [place] Pager [phone] Office [place] 05/17/2020, 4:40 PM

## 2020-05-17 NOTE — Progress Notes (Signed)
[facility] for Heparin Indication: DVT/PE  Allergies  Allergen Reactions  . Cephalexin Shortness Of Breath, Rash and Other (See Comments)    Tolerated Augmentin 2015 and 2017 (12-2017). Tolerated nafcillin 12/2017 PATIENT HAS HAD A PCN REACTION WITH IMMEDIATE RASH, FACIAL/TONGUE/THROAT SWELLING, SOB, OR LIGHTHEADEDNESS WITH HYPOTENSION:  #  #  YES  #  #  Has patient had a PCN reaction causing severe rash involving mucus membranes or skin necrosis: No Has patient had a PCN reaction that required hospitalization: No Has patient had a PCN reaction occurring within the last 10 years: No If all of the above answers are "NO", then may proceed  . Clarithromycin Shortness Of Breath and Rash  . Certolizumab Pegol Hives  . Tocilizumab Hives  . Doxycycline Rash  . Hydroxyzine Rash  . Lidoderm Other (See Comments)    "made me act weird"  . Pyrithione Zinc Rash    Patient Measurements: Height: 6\' 2"  (188 cm) Weight: 88 kg (194 lb 0.1 oz) IBW/kg (Calculated) : 82.2  Heparin Dosing Wt: 88kg   Vital Signs: Temp: 99 F (37.2 C) (04/11 2331) Temp Source: Axillary (04/11 2331) BP: 125/82 (04/11 2331) Pulse Rate: 92 (04/11 2331)  Labs: Recent Labs    05/14/20 1628 05/15/20 3833 05/15/20 0637 05/16/20 0057 05/17/20 0201  HGB  --  10.5*   < > 9.5* 9.5*  HCT  --  33.8*  --  30.2* 31.0*  PLT  --  275  --  233 223  HEPARINUNFRC  --   --   --   --  0.95*  CREATININE  --  1.01  --  0.97  --   CKTOTAL 27*  --   --   --   --    < > = values in this interval not displayed.    Estimated Creatinine Clearance: 77.7 mL/min (by C-G formula based on SCr of 0.97 mg/dL).  Assessment: 75 y.o. male with PE/DVT, switching from Lovenox to heparin while awaiting possible LP, for heparin  Goal of Therapy: Heparin level 0.3-0.7 units/hr  Monitor platelets by anticoagulation protocol: Yes   Plan:  Decrease Heparin 1250 units/hr Check heparin level in 8 hours.    Phillis Knack, PharmD, BCPS  05/17/2020 3:30 AM

## 2020-05-17 NOTE — Progress Notes (Signed)
PROGRESS NOTE                                                                                                                                                                                                             Patient Demographics:    Tyrone Roman, is a 75 y.o. male, DOB - 02/09/1945, XMI:[id number]  Outpatient Primary MD for the patient is [name], MD    LOS - 8  Admit date - 05/09/2020    Chief Complaint  Patient presents with  . Tachycardia       Brief Narrative (HPI from H&P)    - Tyrone Roman is a 75 y.o. male with medical history significant of RA; CAD s/p stent; HTN; HLD; COPD; and BPH presenting with tachycardia.  He can't remember what happened, work-up here suggested hypoxia CHF/pneumonia of care, admitted for further work-up, respiratory status improved after initial diuresis, patient kept having episodes of SIRS, fever, tachycardia and hypotension, as well having elevated D-dimers, venous Dopplers was significant for left lower extremity DVT, and CTA chest was significant for PE, and the typical of pneumonia, hospital stay was complicated by fever of unknown origin, ID was consulted.   Subjective:    [name] with fever yesterday afternoon, was associated with some confusion as well, this morning he reports he is feeling better, afebrile overnight .   Assessment  & Plan :     Acute Hypoxic Resp. -Multifactorial, some volume overload , as well evidence of PE, and pneumonia. -Encouraged to use  incentive spirometry and flutter valve  SIRS/FUO - patient continues to have intermittent fever, hypotensive episode and he is immunosuppressed due to his medication from rheumatoid arthritis on  Rituxan (last dose in January). -So far septic work-up is nonrevealing, so far blood cultures are negative, UA remains negative as well . -ABX  management per ID, broad-spectrum antibiotics, all has been  discontinued by now as clinical suspicion for bacterial infection is low, especially with normal procalcitonin. -SARS antibody nonreactive -So far include negative RPR, hepatitis panel, ANA, HIV, cryptococcal antigen, to plasma antigen - -QuantiFERON gold cryptococcal antigen histoplasma antigen Blastomyces antigen . -No acute infection in EBV or CMV, (both negative IgM ) -No evidence of fever could be found on his CT chest/abdomen/pelvis, and MRI lumbar spine . -Fever is unlikely related to his  DVT/PE, as it remains persistent despite being few days on full dose steroids . -So far no convincing imaging of any malignancy on imaging, but I will obtain CEA, PSA(mildly elevated last year 4.5), SPEP, UPEP, pathology review of his smear.  Questionable steroid dependency  -Patient on low-dose prednisone for his rheumatoid arthritis, with recent increased dose prednisone due to his recent COVID-19 infection . -  ACTH stim test is not diagnostic of adrenal insufficiency. -Patient spiked fever whenever his high steroids is being tapered, so possibly underlining adrenal insufficiency versus autoimmune or malignancy process contributing to his fever being suppressed by IV steroids.  Rheumatoid arthritis -He is treated with Rituxan, last dose in January. -Have discussed with his rheumatologist Dr. Lenna Gilford, he recommends to watch him on prednisone 30 mg p.o. twice daily, to see if he has any recurrent fevers such dose, and if not he can be discharged where she will follow with him as an outpatient, report this dose should be able to suppress any fever caused by autoimmune disorder.  Acute encephalopathy -Most likely related to his fever, MRI of the brain with and without contrast, with abnormal findings, have discussed with neurology, thought to be mainly due to artifact, will await.  Acute DVT/PE -Venous Doppler significant for left lower extremity extensive acute DVT. -Drink yesterday showing increased clot  burden in the lung, I have discussed with vascular surgery who reviewed the venous Dopplers, there is no DVT in iliac area or common femoral, so no indication for thrombectomy . -Continue with Lovenox, can be changed to Eliquis once no procedure is anticipated.  SpO2: 100 % O2 Flow Rate (L/min): 2 L/min FiO2 (%): 28 %  Recent Labs  Lab 05/11/20 0131 05/12/20 0300 05/13/20 0135 05/13/20 0900 05/14/20 0010 05/14/20 0257 05/15/20 0637 05/16/20 0057 05/17/20 0201  WBC 3.9* 7.7 8.1  --  5.7  --  7.9 6.1 4.8  HGB 9.5* 8.9* 9.5*  --  9.0*  --  10.5* 9.5* 9.5*  HCT 29.8* 28.0* 29.8*  --  28.2*  --  33.8* 30.2* 31.0*  PLT 193 210 248  --  242  --  275 233 223  CRP 10.5* 5.6* 3.2*  --  5.7*  --  18.0*  --  9.0*  BNP 122.6* 98.9 72.6  --  144.3*  --   --   --   --   DDIMER 2.75* 3.45* 3.76*  --  2.27*  --   --   --   --   PROCALCITON  --   --   --  0.11 <0.10  --  <0.10 <0.10  --   AST 25 23 36  --  33  --  39  --  26  ALT 17 13 20   --  19  --  17  --  13  ALKPHOS 74 63 68  --  78  --  70  --  64  BILITOT 0.9 0.4 0.7  --  1.1  --  1.3*  --  0.9  ALBUMIN 1.9* 1.9* 2.1*  --  2.9*  --  2.7*  --  2.2*  LATICACIDVEN  --   --   --   --  1.6 1.4  --   --   --      Deconditioning -Patient has had two recent admissions to the hospital . Supportive care with PT OT. At risk for delirium, minimize narcotics and benzodiazepines.   Metabolic encephalopathy. -Resolved, mentation back to baseline  Acute on  Chronic diastolic CHF recent EF 55 to 60%.   BPH. On Flomax.  Dyslipidemia. On statin continue.  History of Rheumatoid arthritis. Takes Rituxan every 4 months.  HTN  - Currently off midodrine  Hyperglycemia -pre Diabetes with A1c of 6.3       Condition - Extremely Guarded  Family Communication  : Discussed with daughter by phone   Code Status : Full code, confirmed by patient and daughter  Consults  :  Pall.Care, ID ,neurology   PUD Prophylaxis : PPI   Procedures  :      Leg ultrasound.      Disposition Plan  :    Status is: Inpatient  Remains inpatient appropriate because:IV treatments appropriate due to intensity of illness or inability to take PO   Dispo: The patient is from: SNF              Anticipated d/c is to: SNF              Patient currently is not medically stable to d/c.   Difficult to place patient No   DVT Prophylaxis  :  Lovenox   Lab Results  Component Value Date   PLT 223 05/17/2020    Diet :  Diet Order            Diet Carb Modified Fluid consistency: Thin; Room service appropriate? Yes  Diet effective now                  Inpatient Medications  Scheduled Meds: . aspirin  81 mg Oral Daily  . insulin aspart  0-15 Units Subcutaneous TID WC  . insulin aspart  0-5 Units Subcutaneous QHS  . lactose free nutrition  237 mL Oral TID WC  . melatonin  5 mg Oral QHS  . metoprolol tartrate  25 mg Oral BID  . mometasone-formoterol  2 puff Inhalation BID  . pantoprazole  40 mg Oral Daily  . predniSONE  30 mg Oral Q12H  . simvastatin  20 mg Oral Daily  . sodium chloride flush  3 mL Intravenous Q12H  . tamsulosin  0.4 mg Oral Daily  . umeclidinium bromide  1 puff Inhalation Daily   Continuous Infusions: . sodium chloride    . heparin 1,250 Units/hr (05/17/20 0338)  . lactated ringers 100 mL/hr at 05/17/20 0338   PRN Meds:.sodium chloride, acetaminophen, acetaminophen **OR** [DISCONTINUED] acetaminophen, albuterol, lip balm, metoprolol tartrate, [DISCONTINUED] ondansetron **OR** ondansetron (ZOFRAN) IV  Antibiotics  :    Anti-infectives (From admission, onward)   Start     Dose/Rate Route Frequency Ordered Stop   05/15/20 2000  vancomycin (VANCOREADY) IVPB 1500 mg/300 mL  Status:  Discontinued        1,500 mg 150 mL/hr over 120 Minutes Intravenous Every 24 hours 05/14/20 0034 05/14/20 1109   05/14/20 1100  levofloxacin (LEVAQUIN) tablet 750 mg  Status:  Discontinued        750 mg Oral Daily 05/14/20 0902  05/14/20 1109   05/14/20 0130  vancomycin (VANCOREADY) IVPB 1500 mg/300 mL        1,500 mg 150 mL/hr over 120 Minutes Intravenous  Once 05/14/20 0034 05/14/20 0458   05/10/20 1700  vancomycin (VANCOREADY) IVPB 1500 mg/300 mL  Status:  Discontinued        1,500 mg 150 mL/hr over 120 Minutes Intravenous Every 24 hours 05/10/20 1613 05/12/20 1041   05/10/20 1700  piperacillin-tazobactam (ZOSYN) IVPB 3.375 g  Status:  Discontinued  3.375 g 12.5 mL/hr over 240 Minutes Intravenous Every 8 hours 05/10/20 1613 05/16/20 1144   05/10/20 1400  ceFEPIme (MAXIPIME) 2 g in sodium chloride 0.9 % 100 mL IVPB  Status:  Discontinued        2 g 200 mL/hr over 30 Minutes Intravenous Every 8 hours 05/10/20 1002 05/10/20 1108   05/10/20 1330  vancomycin (VANCOREADY) IVPB 1500 mg/300 mL  Status:  Discontinued        1,500 mg 150 mL/hr over 120 Minutes Intravenous Every 24 hours 05/09/20 1355 05/10/20 1108   05/10/20 1215  levofloxacin (LEVAQUIN) tablet 750 mg  Status:  Discontinued        750 mg Oral Daily 05/10/20 1115 05/10/20 2003   05/10/20 0100  ceFEPIme (MAXIPIME) 2 g in sodium chloride 0.9 % 100 mL IVPB  Status:  Discontinued        2 g 200 mL/hr over 30 Minutes Intravenous Every 12 hours 05/09/20 1355 05/10/20 1002   05/09/20 1230  ceFEPIme (MAXIPIME) 2 g in sodium chloride 0.9 % 100 mL IVPB        2 g 200 mL/hr over 30 Minutes Intravenous  Once 05/09/20 1227 05/09/20 1324   05/09/20 1230  vancomycin (VANCOREADY) IVPB 2000 mg/400 mL        2,000 mg 200 mL/hr over 120 Minutes Intravenous  Once 05/09/20 1228 05/09/20 1546       Time Spent in minutes  39   Emeline Gins Dian Minahan M.D on 05/17/2020 at 11:14 AM  To page go to [email or web address]   [facility] -  Office  [phone]    See all Orders from today for further details    Objective:   Vitals:   05/16/20 2135 05/16/20 2331 05/17/20 0426 05/17/20 0800  BP:  125/82 130/89 126/87  Pulse:  92 71 74  Resp:  (!) 21 20 18   Temp:  99  F (37.2 C) 98.9 F (37.2 C)   TempSrc:  Axillary Axillary   SpO2: 97% 98% 100% 100%  Weight:      Height:        Wt Readings from Last 3 Encounters:  05/09/20 88 kg  05/05/20 88.5 kg  04/26/20 88.5 kg     Intake/Output Summary (Last 24 hours) at 05/17/2020 1114 Last data filed at 05/17/2020 1024 Gross per 24 hour  Intake 5657.16 ml  Output 1301 ml  Net 4356.16 ml     Physical Exam  Awake Alert, Oriented X 3, pleasant and appropriate today, extremely frail and deconditioned, no new F.N deficits, Normal affect Symmetrical Chest wall movement, Good air movement bilaterally, CTAB RRR,No Gallops,Rubs or new Murmurs, No Parasternal Heave +ve B.Sounds, Abd Soft, No tenderness, No rebound - guarding or rigidity. No Cyanosis, Clubbing or edema, No new Rash or bruise       Data Review:    CBC Recent Labs  Lab 05/11/20 0131 05/12/20 0300 05/13/20 0135 05/14/20 0010 05/15/20 0637 05/16/20 0057 05/17/20 0201  WBC 3.9* 7.7 8.1 5.7 7.9 6.1 4.8  HGB 9.5* 8.9* 9.5* 9.0* 10.5* 9.5* 9.5*  HCT 29.8* 28.0* 29.8* 28.2* 33.8* 30.2* 31.0*  PLT 193 210 248 242 275 233 223  MCV 87.9 87.8 87.9 89.8 92.3 90.1 89.9  MCH 28.0 27.9 28.0 28.7 28.7 28.4 27.5  MCHC 31.9 31.8 31.9 31.9 31.1 31.5 30.6  RDW 18.0* 18.0* 18.3* 18.8* 19.2* 18.6* 18.9*  LYMPHSABS 0.4* 0.5* 0.6* 0.4*  --   --   --   MONOABS 0.1 0.6 0.5  0.3  --   --   --   EOSABS 0.0 0.0 0.2 0.3  --   --   --   BASOSABS 0.0 0.0 0.0 0.0  --   --   --     Recent Labs  Lab 05/11/20 0131 05/12/20 0300 05/12/20 0741 05/13/20 0135 05/13/20 0900 05/14/20 0010 05/14/20 0257 05/15/20 0637 05/16/20 0057 05/17/20 0201  NA 136 133*  --  139  --  139  --  137 133* 136  K 3.8 3.0*  --  3.0*  --  3.9  --  4.1 3.6 3.9  CL 103 102  --  104  --  108  --  104 104 105  CO2 24 22  --  24  --  23  --  23 25 24   GLUCOSE 177* 292*  --  111*  --  119*  --  103* 202* 224*  BUN 17 23  --  23  --  16  --  14 21 15   CREATININE 1.23 1.22  --   1.29*  --  1.31*  --  1.01 0.97 0.91  CALCIUM 9.3 9.0  --  9.4  --  9.7  --  9.7 9.1 9.2  AST 25 23  --  36  --  33  --  39  --  26  ALT 17 13  --  20  --  19  --  17  --  13  ALKPHOS 74 63  --  68  --  78  --  70  --  64  BILITOT 0.9 0.4  --  0.7  --  1.1  --  1.3*  --  0.9  ALBUMIN 1.9* 1.9*  --  2.1*  --  2.9*  --  2.7*  --  2.2*  MG 1.8 1.7  --  1.6*  --  1.8  --   --   --   --   CRP 10.5* 5.6*  --  3.2*  --  5.7*  --  18.0*  --  9.0*  DDIMER 2.75* 3.45*  --  3.76*  --  2.27*  --   --   --   --   PROCALCITON  --   --   --   --  0.11 <0.10  --  <0.10 <0.10  --   LATICACIDVEN  --   --   --   --   --  1.6 1.4  --   --   --   HGBA1C  --   --  6.3*  --   --   --   --   --   --   --   BNP 122.6* 98.9  --  72.6  --  144.3*  --   --   --   --     ------------------------------------------------------------------------------------------------------------------ No results for input(s): CHOL, HDL, LDLCALC, TRIG, CHOLHDL, LDLDIRECT in the last 72 hours.  Lab Results  Component Value Date   HGBA1C 6.3 (H) 05/12/2020   ------------------------------------------------------------------------------------------------------------------ No results for input(s): TSH, T4TOTAL, T3FREE, THYROIDAB in the last 72 hours.  Invalid input(s): FREET3  Cardiac Enzymes No results for input(s): CKMB, TROPONINI, MYOGLOBIN in the last 168 hours.  Invalid input(s): CK ------------------------------------------------------------------------------------------------------------------    Component Value Date/Time   BNP 144.3 (H) 05/14/2020 0010    Micro Results Recent Results (from the past 240 hour(s))  Urine culture     Status: None   Collection Time: 05/09/20  2:07 AM  Specimen: In/Out Cath Urine  Result Value Ref Range Status   Specimen Description IN/OUT CATH URINE  Final   Special Requests NONE  Final   Culture   Final    NO GROWTH Performed at [facility], [address] [place],  [place] [address]    Report Status 05/11/2020 FINAL  Final  Resp Panel by RT-PCR (Flu A&B, Covid) Nasopharyngeal Swab     Status: Abnormal   Collection Time: 05/09/20 12:12 PM   Specimen: Nasopharyngeal Swab; Nasopharyngeal(NP) swabs in vial transport medium  Result Value Ref Range Status   SARS Coronavirus 2 by RT PCR POSITIVE (A) NEGATIVE Final    Comment: RESULT CALLED TO, READ BACK BY AND VERIFIED WITH: RN P [facility] [id number] AT [id number] BY CM (NOTE) SARS-CoV-2 target nucleic acids are DETECTED.  The SARS-CoV-2 RNA is generally detectable in upper respiratory specimens during the acute phase of infection. Positive results are indicative of the presence of the identified virus, but do not rule out bacterial infection or co-infection with other pathogens not detected by the test. Clinical correlation with patient history and other diagnostic information is necessary to determine patient infection status. The expected result is Negative.  Fact Sheet for Patients: EntrepreneurPulse.com.au  Fact Sheet for Healthcare Providers: IncredibleEmployment.be  This test is not yet approved or cleared by the [place] FDA and  has been authorized for detection and/or diagnosis of SARS-CoV-2 by FDA under an Emergency Use Authorization (EUA).  This EUA will remain in effect (meaning this test can be u sed) for the duration of  the COVID-19 declaration under Section 564(b)(1) of the Act, 21 U.S.C. section 360bbb-3(b)(1), unless the authorization is terminated or revoked sooner.     Influenza A by PCR NEGATIVE NEGATIVE Final   Influenza B by PCR NEGATIVE NEGATIVE Final    Comment: (NOTE) The Xpert Xpress SARS-CoV-2/FLU/RSV plus assay is intended as an aid in the diagnosis of influenza from Nasopharyngeal swab specimens and should not be used as a sole basis for treatment. Nasal washings and aspirates are unacceptable for Xpert Xpress  SARS-CoV-2/FLU/RSV testing.  Fact Sheet for Patients: EntrepreneurPulse.com.au  Fact Sheet for Healthcare Providers: IncredibleEmployment.be  This test is not yet approved or cleared by the [place] FDA and has been authorized for detection and/or diagnosis of SARS-CoV-2 by FDA under an Emergency Use Authorization (EUA). This EUA will remain in effect (meaning this test can be used) for the duration of the COVID-19 declaration under Section 564(b)(1) of the Act, 21 U.S.C. section 360bbb-3(b)(1), unless the authorization is terminated or revoked.  Performed at [facility], [place] [address], [place], [place] [address]   Blood Culture (routine x 2)     Status: None   Collection Time: 05/09/20 12:12 PM   Specimen: BLOOD  Result Value Ref Range Status   Specimen Description BLOOD SITE NOT SPECIFIED  Final   Special Requests   Final    BOTTLES DRAWN AEROBIC AND ANAEROBIC Blood Culture adequate volume   Culture   Final    NO GROWTH 5 DAYS Performed at [facility], [address] [place], [place] [address]    Report Status 05/14/2020 FINAL  Final  Blood Culture (routine x 2)     Status: None   Collection Time: 05/09/20 12:12 PM   Specimen: BLOOD  Result Value Ref Range Status   Specimen Description BLOOD BLOOD LEFT HAND  Final   Special Requests   Final    BOTTLES DRAWN  AEROBIC AND ANAEROBIC Blood Culture adequate volume   Culture   Final    NO GROWTH 5 DAYS Performed at [facility], [place] [address], [place], [place] [address]    Report Status 05/14/2020 FINAL  Final  MRSA PCR Screening     Status: Abnormal   Collection Time: 05/09/20 12:30 PM   Specimen: Nasal Mucosa; Nasopharyngeal  Result Value Ref Range Status   MRSA by PCR POSITIVE (A) NEGATIVE Final    Comment:        The GeneXpert MRSA Assay (FDA approved for NASAL specimens only), is one component of a comprehensive MRSA colonization surveillance program.  It is not intended to diagnose MRSA infection nor to guide or monitor treatment for MRSA infections. RESULT CALLED TO, READ BACK BY AND VERIFIED WITH: IDOL,K RN AT 2212 05/09/2020 MITCHELL,L Performed at [facility], [place] [address] [place], [place] [address]   Culture, blood (routine x 2)     Status: None (Preliminary result)   Collection Time: 05/13/20 11:07 AM   Specimen: BLOOD RIGHT HAND  Result Value Ref Range Status   Specimen Description BLOOD RIGHT HAND  Final   Special Requests   Final    BOTTLES DRAWN AEROBIC AND ANAEROBIC Blood Culture adequate volume   Culture   Final    NO GROWTH 4 DAYS Performed at [facility], [place] [address] [place], [place] [address]    Report Status PENDING  Incomplete  Culture, blood (routine x 2)     Status: None (Preliminary result)   Collection Time: 05/13/20 11:13 AM   Specimen: BLOOD LEFT HAND  Result Value Ref Range Status   Specimen Description BLOOD LEFT HAND  Final   Special Requests   Final    BOTTLES DRAWN AEROBIC AND ANAEROBIC Blood Culture adequate volume   Culture   Final    NO GROWTH 4 DAYS Performed at [facility], [place] [address] [place], [place] [address]    Report Status PENDING  Incomplete    Radiology Reports CT ANGIO CHEST PE W OR WO CONTRAST  Result Date: 05/14/2020 CLINICAL DATA:  High probability sepsis due to pneumonia EXAM: CT ANGIOGRAPHY CHEST WITH CONTRAST TECHNIQUE: Multidetector CT imaging of the chest was performed using the standard protocol during bolus administration of intravenous contrast. Multiplanar CT image reconstructions and MIPs were obtained to evaluate the vascular anatomy. CONTRAST:  61mL OMNIPAQUE IOHEXOL 350 MG/ML SOLN COMPARISON:  04/02/2020 FINDINGS: Cardiovascular: Satisfactory opacification by motion of pulmonary artifact. Arteries although limited there is a convincing branching pulmonary artery filling defect affecting segmental vessels in the basal right lower lobe as  marked on series 6. Additional peripheral emboli could be present and obscured by the degree of motion artifact. No central embolism is seen. No evidence of right heart strain. Heart size is normal. No pericardial effusion. Atherosclerosis of the coronaries. Mediastinum/Nodes: Negative for adenopathy or mass. Lungs/Pleura: Chronic lung disease with reticulation. Bilateral pleural plaque appearance without calcification. There is increased ground-glass density scattered in the bilateral lungs, greatest in the right upper lobe. No interlobular septal thickening or pleural fluid to implicate failure. Mild centrilobular emphysema. Upper Abdomen: Negative Musculoskeletal: No unexpected finding Review of the MIP images confirms the above findings. Critical Value/emergent results were called by telephone at the time of interpretation on 05/14/2020 at 8:35 am to provider Tyshae Stair, who verbally acknowledged these results. IMPRESSION: 1. Positive for segmental pulmonary embolism in the right lower lobe. Additional pulmonary emboli could be  obscured by the degree of motion artifact. 2. Background of chronic opacities related to recent COVID infection. There is also new ground-glass opacity especially in the right upper lobe suggesting a superimposed acute pneumonia. 3. Aortic Atherosclerosis (ICD10-I70.0).  Coronary atherosclerosis. Electronically Signed   By: Monte Fantasia M.D.   On: 05/14/2020 08:35   MR BRAIN W WO CONTRAST  Result Date: 05/16/2020 CLINICAL DATA:  Delirium; fever with unknown origin, altered mental status, evaluate for encephalitis. EXAM: MRI HEAD WITHOUT AND WITH CONTRAST TECHNIQUE: Multiplanar, multiecho pulse sequences of the brain and surrounding structures were obtained without and with intravenous contrast. CONTRAST:  97mL GADAVIST GADOBUTROL 1 MMOL/ML IV SOLN COMPARISON:  Prior head CT examinations 04/02/2020 and earlier. Brain MRI 12/13/2017. FINDINGS: Brain: Mild cerebral and cerebellar atrophy.  AP punctate focus of restricted diffusion is questioned within the right parietal lobe (versus artifact) (series 3, image 33). Additionally, there is apparent subtle restricted diffusion along the midline callosal splenium, slightly eccentric to the left. A few small foci of T2/FLAIR hyperintensity within the bilateral cerebral white matter and right thalamus are nonspecific, but likely reflect minimal chronic small vessel ischemic disease. Redemonstrated left retrocerebellar arachnoid cyst. There is no acute infarct. No evidence of intracranial mass. No chronic intracranial blood products. No extra-axial fluid collection. No midline shift. No abnormal intracranial enhancement. Vascular: Expected proximal arterial flow voids. Skull and upper cervical spine: No focal marrow lesion. Incompletely assessed cervical spondylosis. Suspected C4-C5 vertebral body ankylosis. Sinuses/Orbits: Visualized orbits show no acute finding. Bilateral lens replacements. Mild paranasal sinus mucosal thickening, most notably within the bilateral ethmoid air cells. Small left maxillary sinus mucous retention cyst. Other: Right mastoid effusion. IMPRESSION: Punctate acute infarct versus artifact within the right parietal lobe. Additionally, there is apparent subtle restricted diffusion within the midline callosal splenium, slightly eccentric to the left. This may reflect a small focus of acute ischemia or artifact. Alternatively, this may reflect a cytotoxic lesion of the corpus callosum (which has a broad range of potential etiologies and clinical associations), although the appearance would be atypical for this entity. Background mild parenchymal atrophy and chronic small vessel ischemic disease. Mild paranasal sinus disease as described. Right mastoid effusion. Electronically Signed   By: Kellie Simmering DO   On: 05/16/2020 18:51   MR Lumbar Spine W Wo Contrast  Result Date: 05/13/2020 CLINICAL DATA:  Fever, lumbar spine tenderness EXAM:  MRI LUMBAR SPINE WITHOUT AND WITH CONTRAST TECHNIQUE: Multiplanar and multiecho pulse sequences of the lumbar spine were obtained without and with intravenous contrast. CONTRAST:  2mL GADAVIST GADOBUTROL 1 MMOL/ML IV SOLN COMPARISON:  04/03/2018 FINDINGS: Segmentation:  Standard. Alignment:  Stable. Vertebrae: There is now fusion across the L1-L2 disc space. There is no new marrow edema or enhancement. Multilevel degenerative plate irregularity and Schmorl's nodes. Conus medullaris and cauda equina: Conus extends to the L1-L2 level. Conus and cauda equina appear normal. No intrathecal enhancement. Paraspinal and other soft tissues: Mild lower lumbar paraspinal edema. No evidence of collection. Disc levels: L1-L2: Prior right laminectomy. Stable retrolisthesis with bridging endplate osteophytes effacing the ventral thecal sac. Similar mild to moderate foraminal stenosis. L2-L3: Disc bulge. Facet arthropathy with ligamentum flavum infolding. Slightly increased mild canal stenosis. Similar mild foraminal stenosis. L3-L4: Disc bulge. Facet arthropathy with ligamentum flavum infolding. Similar moderate canal stenosis with effacement of subarticular recesses. Similar mild to moderate left greater than right foraminal stenosis. L4-L5: Disc bulge with superimposed left subarticular/foraminal protrusion. Facet arthropathy with ligamentum flavum infolding. Similar mild canal stenosis. Similar  partial effacement of the left subarticular recess. Similar mild foraminal stenosis. L5-S1: Disc bulge with endplate osteophytic ridging. Facet arthropathy. No canal stenosis. Similar mild foraminal stenosis. IMPRESSION: No evidence of discitis/osteomyelitis. Multilevel degenerative changes as detailed above without substantial change since the prior examination. Electronically Signed   By: Macy Mis M.D.   On: 05/13/2020 14:25   CT ABDOMEN PELVIS W CONTRAST  Result Date: 05/15/2020 CLINICAL DATA:  Sepsis.  Fever of unknown  origin. EXAM: CT ABDOMEN AND PELVIS WITH CONTRAST TECHNIQUE: Multidetector CT imaging of the abdomen and pelvis was performed using the standard protocol following bolus administration of intravenous contrast. CONTRAST:  178mL OMNIPAQUE IOHEXOL 300 MG/ML  SOLN COMPARISON:  04/02/2020.  Chest CTA obtained earlier today. FINDINGS: Lower chest: Progressive pulmonary embolism on the right, with emboli currently demonstrated in the distal right main pulmonary artery and significantly more clot in the intersegmental and proximal right lower lobe pulmonary arteries. Bilateral lung densities and atelectasis with improvement since this morning. Hepatobiliary: Diffuse low density of the liver relative to the spleen. Normal appearing gallbladder. Pancreas: Unremarkable. No pancreatic ductal dilatation or surrounding inflammatory changes. Spleen: Normal in size without focal abnormality. Adrenals/Urinary Tract: Normal appearing adrenal glands. Bilateral renal cysts. Multiple small bilateral renal calculi. No bladder or ureteral calculi are seen. Excreted contrast in the urinary bladder from the CTA earlier today. Stomach/Bowel: Multiple colonic diverticula without evidence of diverticulitis. Unremarkable stomach, small bowel and appendix. Vascular/Lymphatic: Atheromatous arterial calcifications without aneurysm. No enlarged lymph nodes. Two renal arteries on the left. Reproductive: Mildly enlarged prostate gland. Other: Small bilateral inguinal hernias containing fat and very small umbilical artery containing fat. Musculoskeletal: Stable lumbar and lower thoracic spine degenerative changes and fusion of the L1 and L2 vertebral bodies. IMPRESSION: 1. Progressive pulmonary embolism on the right, with emboli currently demonstrated in the distal right main pulmonary artery and significantly more clot in the intersegmental and proximal right lower lobe pulmonary arteries. These results will be called to the ordering clinician or  representative by the Radiologist Assistant, and communication documented in the PACS or [employer]. 2. Lung densities and atelectasis with improvement since this morning. 3. Diffuse hepatic steatosis. 4. Multiple small, nonobstructing bilateral renal calculi. 5. Colonic diverticulosis. Electronically Signed   By: Claudie Revering M.D.   On: 05/15/2020 22:17   VAS [place] IVC/ILIAC (VENOUS ONLY)  Result Date: 05/11/2020 IVC/ILIAC STUDY Indications: DVT, recent COVID Limitations: Air/bowel gas.  Comparison Study: No prior study Performing Technologist: Maudry Mayhew MHA, RDMS, RVT, RDCS  Examination Guidelines: A complete evaluation includes B-mode imaging, spectral Doppler, color Doppler, and power Doppler as needed of all accessible portions of each vessel. Bilateral testing is considered an integral part of a complete examination. Limited examinations for reoccurring indications may be performed as noted.  IVC/Iliac Findings: +----------+------+--------+--------+    IVC    PatentThrombusComments +----------+------+--------+--------+ IVC Mid   patent                 +----------+------+--------+--------+ IVC Distalpatent                 +----------+------+--------+--------+  +-------------------+---------+-----------+---------+-----------+--------+         CIV        RT-PatentRT-ThrombusLT-PatentLT-ThrombusComments +-------------------+---------+-----------+---------+-----------+--------+ Common Iliac Prox                       patent                      +-------------------+---------+-----------+---------+-----------+--------+ Common Iliac  Mid                        patent                      +-------------------+---------+-----------+---------+-----------+--------+ Common Iliac Distal patent              patent                      +-------------------+---------+-----------+---------+-----------+--------+   +-------------------------+---------+-----------+---------+-----------+--------+            EIV           RT-PatentRT-ThrombusLT-PatentLT-ThrombusComments +-------------------------+---------+-----------+---------+-----------+--------+ External Iliac Vein Prox                      patent                      +-------------------------+---------+-----------+---------+-----------+--------+ External Iliac Vein       patent                                          Distal                                                                    +-------------------------+---------+-----------+---------+-----------+--------+   Summary: IVC/Iliac: No evidence of thrombus in IVC and Iliac veins.  *See table(s) above for measurements and observations.  Electronically signed by Harold Barban MD on 05/11/2020 at 9:55:50 PM.    Final    DG Chest Port 1 View  Result Date: 05/10/2020 CLINICAL DATA:  Short of breath, history of COPD EXAM: PORTABLE CHEST 1 VIEW COMPARISON:  Prior chest x-ray dated yesterday FINDINGS: Stable cardiac and mediastinal contours. Marked interval increase in diffuse interstitial prominence bilaterally now with Kerley B-lines in the periphery. Findings are most consistent with worsening CHF. No large effusion or pneumothorax. No acute osseous abnormality. IMPRESSION: Increasing interstitial pulmonary edema concerning for progressive CHF. Electronically Signed   By: Jacqulynn Cadet M.D.   On: 05/10/2020 07:54   DG Chest Port 1 View  Result Date: 05/09/2020 CLINICAL DATA:  Possible sepsis Fever and cough EXAM: PORTABLE CHEST 1 VIEW COMPARISON:  04/04/2020 FINDINGS: Heart size within normal limits. No pulmonary vascular congestion. Interval improvement in aeration of the lungs with bilateral airspace opacities still remaining. IMPRESSION: Interval improvement in aeration of the lungs with bilateral opacities still remaining consistent with resolving pneumonia. Electronically Signed    By: Miachel Roux M.D.   On: 05/09/2020 12:56   VAS [place] LOWER EXTREMITY VENOUS (DVT)  Result Date: 05/11/2020  Lower Venous DVT Study Indications: Edema, Recent Covid infection, and SOB.  Comparison Study: 03/31/20 negative bilateral lower extremity venous duplex Performing Technologist: Maudry Mayhew MHA, RDMS, RVT, RDCS  Examination Guidelines: A complete evaluation includes B-mode imaging, spectral Doppler, color Doppler, and power Doppler as needed of all accessible portions of each vessel. Bilateral testing is considered an integral part of a complete examination. Limited examinations for reoccurring indications may be performed as noted. The reflux portion of the exam is performed with the patient in reverse Trendelenburg.  +---------+---------------+---------+-----------+----------+--------------+ RIGHT  CompressibilityPhasicitySpontaneityPropertiesThrombus Aging +---------+---------------+---------+-----------+----------+--------------+ CFV      Full           Yes      Yes                                 +---------+---------------+---------+-----------+----------+--------------+ SFJ      Full                                                        +---------+---------------+---------+-----------+----------+--------------+ FV Prox  Full                                                        +---------+---------------+---------+-----------+----------+--------------+ FV Mid   Full                                                        +---------+---------------+---------+-----------+----------+--------------+ FV DistalFull                                                        +---------+---------------+---------+-----------+----------+--------------+ PFV      Full                                                        +---------+---------------+---------+-----------+----------+--------------+ POP      Full           Yes      Yes                                  +---------+---------------+---------+-----------+----------+--------------+ PTV      Full                                                        +---------+---------------+---------+-----------+----------+--------------+ PERO     Full                                                        +---------+---------------+---------+-----------+----------+--------------+ Gastroc  None                    No                   Acute          +---------+---------------+---------+-----------+----------+--------------+   +---------+---------------+---------+-----------+----------+--------------+  LEFT     CompressibilityPhasicitySpontaneityPropertiesThrombus Aging +---------+---------------+---------+-----------+----------+--------------+ CFV      Full           Yes      Yes                                 +---------+---------------+---------+-----------+----------+--------------+ SFJ      Full                                                        +---------+---------------+---------+-----------+----------+--------------+ FV Prox  None                    No                   Acute          +---------+---------------+---------+-----------+----------+--------------+ FV Mid   None                    No                   Acute          +---------+---------------+---------+-----------+----------+--------------+ FV DistalNone                    No                   Acute          +---------+---------------+---------+-----------+----------+--------------+ PFV      Full                                                        +---------+---------------+---------+-----------+----------+--------------+ POP      None                    No                   Acute          +---------+---------------+---------+-----------+----------+--------------+ PTV      None                    No                   Acute           +---------+---------------+---------+-----------+----------+--------------+ PERO     None                    No                   Acute          +---------+---------------+---------+-----------+----------+--------------+     Summary: RIGHT: - Findings consistent with acute deep vein thrombosis involving the right gastrocnemius veins. - No cystic structure found in the popliteal fossa.  LEFT: - Findings consistent with acute deep vein thrombosis involving the left femoral vein, left popliteal vein, left posterior tibial veins, and left peroneal veins. - No cystic structure found in the popliteal fossa.  *See table(s) above for measurements and observations. Electronically signed by Harold Barban MD on 05/11/2020 at 9:56:21 PM.  Final

## 2020-05-17 NOTE — Consult Note (Signed)
NEUROLOGY CONSULTATION NOTE   Date of service: May 17, 2020 Patient Name: Tyrone Roman MRN:  [id number] DOB:  03/17/1945 Reason for consult: "AMS with fever" _ _ _   _ __   _ __ _ _  __ __   _ __   __ _  History of Present Illness  Tyrone Roman is a 75 y.o. male with PMH significant for  has a past medical history of Allergy, Barrett's esophageal ulceration, BPH (benign prostatic hyperplasia), CAD (coronary artery disease), Cataract, Colonic polyp, Concussion with loss of consciousness of 30 minutes or less, Contact dermatitis, Contact with or exposure to venereal diseases, COPD (chronic obstructive pulmonary disease) ([place]), Diverticulosis of colon, Dysphagia, GERD (gastroesophageal reflux disease), HNP (herniated nucleus pulposus), lumbar, Hyperlipidemia, Hypertension, Myocardial infarction ([place]), Nephrolithiasis, Rheumatoid arthritis ([place]), and SOB (shortness of breath). who presents with tachycardia and AMS with fever. He can't remember what happened. He came in "for testing". He has been at [facility] several months in a row. He was sent there. He was in an ambulance going to [facility] to have his chest checked because the people at [facility] didn't have the facility to check it and the medical person there recommended that he be at [facility]. He has had some congestion. +coughing a lot, productive. When he "got there, I was admitted."  He reports that about 2 months ago he developed leg weakness and issues with balance. That was when he was first admitted he has been admitted multiple times - 1/24:COVID, 2/09: Acute Respiratory Failure, 2/18: Hospital Acquired Pneumonia, 2/26:AMS. He then was discharged to a SNF for rehab and was making progress. He reports that last week he was doing his work out when he began feeling funny. He was examined by staff at the SNF and they recommended sending him to [facility]. He reports he does not remember the ride to [facility] but remembers almost everything since then.   ROS    Constitutional Denies weight loss, fever and chills.   HEENT Denies changes in vision and hearing.   Respiratory Reports occasional cough.   CV Denies palpitations and CP   GI Denies abdominal pain, nausea, vomiting and diarrhea.   GU Denies dysuria and urinary frequency.   MSK Denies myalgia and joint pain.   Skin Denies rash and pruritus.   Neurological Denies headache and syncope.   Psychiatric Denies recent changes in mood. Denies anxiety and depression.    Past History   Past Medical History:  Diagnosis Date  . Allergy   . Barrett's esophageal ulceration   . BPH (benign prostatic hyperplasia)   . CAD (coronary artery disease)    sees Dr. Mar Daring  . Cataract    both eyes removed   . Colonic polyp   . Concussion with loss of consciousness of 30 minutes or less   . Contact dermatitis   . Contact with or exposure to venereal diseases   . COPD (chronic obstructive pulmonary disease) ([place])    sees Dr. Kara Mead   . Diverticulosis of colon   . Dysphagia   . GERD (gastroesophageal reflux disease)    past hx- on meds   . HNP (herniated nucleus pulposus), lumbar    recurrent  . Hyperlipidemia   . Hypertension   . Myocardial infarction [facility])    2011- stent placed  . Nephrolithiasis   . Rheumatoid arthritis [facility])    sees Dr. Gavin Pound   . SOB (shortness of breath)    Past Surgical  History:  Procedure Laterality Date  . BACK SURGERY    . CARDIAC CATHETERIZATION N/A 01/19/2016   Procedure: Left Heart Cath and Coronary Angiography;  Surgeon: Burnell Blanks, MD;  Location: [place] CV LAB;  Service: Cardiovascular;  Laterality: N/A;  . COLONOSCOPY  12/09/2014   per Dr. Silverio Decamp, adenomatous polyps, repeat 3 years   . CORONARY ANGIOPLASTY WITH STENT PLACEMENT    . ESOPHAGOGASTRODUODENOSCOPY (EGD) WITH ESOPHAGEAL DILATION  02/17/09   Barretts esophagus   . EYE SURGERY    . KNEE ARTHROSCOPY Left X 2  . LUMBAR LAMINECTOMY/DECOMPRESSION MICRODISCECTOMY  Right 10/07/2017   Procedure: RIGHT LUMBAR ONE-TWO LAMINECTOMY WITH MICRODISCECTOMY;  Surgeon: Consuella Lose, MD;  Location: [place];  Service: Neurosurgery;  Laterality: Right;  . LUMBAR LAMINECTOMY/DECOMPRESSION MICRODISCECTOMY N/A 12/06/2017   Procedure: RECURRENT MICRODISCECTOMY LUMBAR ONE - LUMBAR TWO;  Surgeon: Consuella Lose, MD;  Location: [place];  Service: Neurosurgery;  Laterality: N/A;  . LUMBAR WOUND DEBRIDEMENT N/A 12/11/2017   Procedure: LUMBAR WOUND DEBRIDEMENT;  Surgeon: Consuella Lose, MD;  Location: [place];  Service: Neurosurgery;  Laterality: N/A;  . POLYPECTOMY    . TONSILLECTOMY    . UPPER GASTROINTESTINAL ENDOSCOPY     Family History  Problem Relation Age of Onset  . Heart attack Father        cardiovascular disorder  . Arthritis Father        family hx  . Colon cancer Father        mets  . Prostate cancer Father        1st degree relative  . Arthritis Mother   . Colon polyps Mother   . Melanoma Mother   . Dementia Mother   . Diabetes Paternal Uncle   . Colon cancer Paternal Uncle   . Stomach cancer Paternal Uncle   . Melanoma Paternal Uncle   . Cancer Maternal Grandmother   . Skin cancer Daughter   . Colon polyps Sister   . Esophageal cancer Neg Hx   . Rectal cancer Neg Hx    Social History   Socioeconomic History  . Marital status: Divorced    Spouse name: Not on file  . Number of children: 4  . Years of education: Not on file  . Highest education level: Not on file  Occupational History  . Occupation: ENGINEER    Employer: [employer]  Tobacco Use  . Smoking status: Former Smoker    Packs/day: 1.00    Years: 20.00    Pack years: 20.00    Types: Cigarettes    Quit date: 10/12/1988    Years since quitting: 31.6  . Smokeless tobacco: Never Used  . Tobacco comment: 1960's   Vaping Use  . Vaping Use: Never used  Substance and Sexual Activity  . Alcohol use: Not Currently    Alcohol/week: 0.0 standard drinks    Comment: rare  . Drug  use: No  . Sexual activity: Not on file  Other Topics Concern  . 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       04/14/2018:   Lives alone, has girlfriend who visits, main source of emotional support   Has been struggling with recent hospitalizations/ill health following sepsis and back surgeries   Social Determinants of Health   Financial Resource Strain: Low Risk   . Difficulty of Paying Living Expenses: Not hard at all  Food Insecurity: No Food Insecurity  .  Worried About Charity fundraiser in the Last Year: Never true  . Ran Out of Food in the Last Year: Never true  Transportation Needs: No Transportation Needs  . Lack of Transportation (Medical): No  . Lack of Transportation (Non-Medical): No  Physical Activity: Insufficiently Active  . Days of Exercise per Week: 2 days  . Minutes of Exercise per Session: 60 min  Stress: Stress Concern Present  . Feeling of Stress : To some extent  Social Connections: Moderately Isolated  . Frequency of Communication with Friends and Family: More than three times a week  . Frequency of Social Gatherings with Friends and Family: Once a week  . Attends Religious Services: Never  . Active Member of Clubs or Organizations: Yes  . Attends Archivist Meetings: More than 4 times per year  . Marital Status: Divorced   Allergies  Allergen Reactions  . Cephalexin Shortness Of Breath, Rash and Other (See Comments)    Tolerated Augmentin 2015 and 2017 (12-2017). Tolerated nafcillin 12/2017 PATIENT HAS HAD A PCN REACTION WITH IMMEDIATE RASH, FACIAL/TONGUE/THROAT SWELLING, SOB, OR LIGHTHEADEDNESS WITH HYPOTENSION:  #  #  YES  #  #  Has patient had a PCN reaction causing severe rash involving mucus membranes or skin necrosis: No Has patient had a PCN reaction that required hospitalization: No Has patient had a PCN reaction occurring within the last 10 years:  No If all of the above answers are "NO", then may proceed  . Clarithromycin Shortness Of Breath and Rash  . Certolizumab Pegol Hives  . Tocilizumab Hives  . Doxycycline Rash  . Hydroxyzine Rash  . Lidoderm Other (See Comments)    "made me act weird"  . Pyrithione Zinc Rash    Medications   Medications Prior to Admission  Medication Sig Dispense Refill Last Dose  . acetaminophen (TYLENOL) 325 MG tablet Take 650 mg by mouth every 6 (six) hours as needed for headache, mild pain or fever.   04/21/2020 at 1027  . albuterol (VENTOLIN HFA) 108 (90 Base) MCG/ACT inhaler Inhale 2 puffs into the lungs every 4 (four) hours as needed for wheezing or shortness of breath. 18 g 2 unknown  . aspirin 81 MG chewable tablet Chew 1 tablet (81 mg total) by mouth daily.   05/09/2020 at 1014  . budesonide (PULMICORT) 0.5 MG/2ML nebulizer solution Take 0.5 mg by nebulization 2 (two) times daily.   05/09/2020 at 1014  . budesonide-formoterol (SYMBICORT) 160-4.5 MCG/ACT inhaler Inhale 2 puffs into the lungs in the morning and at bedtime. 1 each 12 04/26/2020 at 0949  . Emollient (EUCERIN INTENSIVE REPAIR EX) Apply 1 application topically 2 (two) times daily. Apply to feet and heels.   05/09/2020 at 1014  . Lidocaine 4 % PTCH Apply 1 patch topically daily. Apply to hip.   05/09/2020 at 1014  . melatonin 5 MG TABS Take 5 mg by mouth at bedtime.   05/08/2020 at 2317  . Menthol (ICY HOT) 5 % PTCH Apply 1 patch topically 2 (two) times daily. Apply to lower back.   05/09/2020 at 1014  . omeprazole (PRILOSEC) 40 MG capsule Take 1 capsule (40 mg total) by mouth daily. 90 capsule 3 05/09/2020 at 1014  . simvastatin (ZOCOR) 20 MG tablet Take 1 tablet (20 mg total) by mouth at bedtime. (Patient taking differently: Take 20 mg by mouth daily.) 90 tablet 3 05/09/2020 at 1014  . Tiotropium Bromide Monohydrate (SPIRIVA RESPIMAT) 2.5 MCG/ACT AERS Inhale 2 puffs into the lungs  daily. 4 g 6 05/09/2020 at 1014     Vitals   Vitals:   05/16/20 2020  05/16/20 2135 05/16/20 2331 05/17/20 0426  BP: 111/75  125/82 130/89  Pulse: (!) 110  92 71  Resp: 20  (!) 21 20  Temp: 99.3 F (37.4 C)  99 F (37.2 C) 98.9 F (37.2 C)  TempSrc: Axillary  Axillary Axillary  SpO2: 99% 97% 98% 100%  Weight:      Height:         Body mass index is 24.91 kg/m.  Physical Exam   General: Laying comfortably in bed; in no acute distress.  HENT: Normal oropharynx and mucosa. Normal external appearance of ears and nose.  Neck: Supple, no pain or tenderness  CV: No JVD. No peripheral edema.  Pulmonary: Symmetric Chest rise. Normal respiratory effort.  Abdomen: Soft to touch, non-tender.  Ext: No cyanosis, edema, or deformity  Skin: No rash. Normal palpation of skin.   Musculoskeletal: Normal digits and nails by inspection. No clubbing.   Neurologic Examination  Mental status/Cognition: Alert, oriented to self, place, month and year, good attention. Able to follow all commands Speech/language: Fluent, comprehension intact, object naming intact, repetition intact. Cranial nerves:   CN II Pupils equal and reactive to light, no VF deficits    CN III,IV,VI EOM intact, no gaze preference or deviation, no nystagmus    CN V normal sensation in V1, V2, and V3 segments bilaterally    CN VII no asymmetry, no nasolabial fold flattening    CN VIII normal hearing to speech    CN IX & X normal palatal elevation, no uvular deviation    CN XI 5/5 head turn and 5/5 shoulder shrug bilaterally    CN XII midline tongue protrusion    Motor:  RUE: 5/5  LUE:5/5 RLE: 4/5  LLE: 4/5 Muscle bulk: normal, tone normal, no pronator drift or tremor present  Reflexes: Has sustained Clonus on Left ankle and multiple beats of Clonus on Right ankle  Sensation: Light touch intact bilaterally Temperature: reports impaired cold sensation bilateral lower extremities  Position sense is intact bilaterally in lower extremities  Coordination/Complex Motor:  - Finger to Nose  intact bilaterally - Heel to shin intact bilaterally  - Gait: deferred  Labs   CBC:  Recent Labs  Lab 05/13/20 0135 05/14/20 0010 05/15/20 0637 05/16/20 0057 05/17/20 0201  WBC 8.1 5.7   < > 6.1 4.8  NEUTROABS 6.5 4.3  --   --   --   HGB 9.5* 9.0*   < > 9.5* 9.5*  HCT 29.8* 28.2*   < > 30.2* 31.0*  MCV 87.9 89.8   < > 90.1 89.9  PLT 248 242   < > 233 223   < > = values in this interval not displayed.    Basic Metabolic Panel:  Lab Results  Component Value Date   NA 136 05/17/2020   K 3.9 05/17/2020   CO2 24 05/17/2020   GLUCOSE 224 (H) 05/17/2020   BUN 15 05/17/2020   CREATININE 0.91 05/17/2020   CALCIUM 9.2 05/17/2020   GFRNONAA >60 05/17/2020   GFRAA 48 (L) 02/18/2018   Lipid Panel:  Lab Results  Component Value Date   LDLCALC 83 05/11/2019   HgbA1c:  Lab Results  Component Value Date   HGBA1C 6.3 (H) 05/12/2020   Urine Drug Screen: No results found for: LABOPIA, COCAINSCRNUR, LABBENZ, AMPHETMU, THCU, LABBARB  Alcohol Level No results found for: [facility]  MRI Brain:  Punctate acute infarct versus artifact within the right parietal lobe. Additionally, there is apparent subtle restricted diffusion within the midline callosal splenium, slightly eccentric to the left. This may reflect a small focus of acute ischemia or artifact. Alternatively, this may reflect a cytotoxic lesion of the corpus callosum (which has a broad range of potential etiologies and clinical associations), although the appearance would be atypical for this entity. Background mild parenchymal atrophy and chronic small vessel ischemic disease. Mild paranasal sinus disease as described. Right mastoid effusion.   Impression   Tyrone Roman is a 75 y.o. male with PMH significant for RA; CAD s/p stent; HTN; HLD; COPD; and BPH. His neurologic examination is notable for temperature sensation abnormalities and reflex abnormalities. Still has weakness in his legs but that has been present for the last 2  months and does appears to be due to deconditioning. He otherwise has no acute findings on exam. His confusion has cleared up and he has been afebrile since yesterday.  MRI result appears to be artifact but he is already optimized on Stroke prevention so would not recommend changes at this time. Given neuro findings on exam epideral abscess is possible so will order MRI C & T spine W/WO Contrast.  Recommendations  -Continue infection work up per primary team -Obtain MRI C & T spine W/WO Contrast ______________________________________________________________________   Fatima Sanger MD Resident

## 2020-05-17 NOTE — Progress Notes (Signed)
ANTICOAGULATION CONSULT NOTE - Initial Consult  Pharmacy Consult for Heparin > Enoxaparin Indication: DVT; PE  Allergies  Allergen Reactions  . Cephalexin Shortness Of Breath, Rash and Other (See Comments)    Tolerated Augmentin 2015 and 2017 (12-2017). Tolerated nafcillin 12/2017 PATIENT HAS HAD A PCN REACTION WITH IMMEDIATE RASH, FACIAL/TONGUE/THROAT SWELLING, SOB, OR LIGHTHEADEDNESS WITH HYPOTENSION:  #  #  YES  #  #  Has patient had a PCN reaction causing severe rash involving mucus membranes or skin necrosis: No Has patient had a PCN reaction that required hospitalization: No Has patient had a PCN reaction occurring within the last 10 years: No If all of the above answers are "NO", then may proceed  . Clarithromycin Shortness Of Breath and Rash  . Certolizumab Pegol Hives  . Tocilizumab Hives  . Doxycycline Rash  . Hydroxyzine Rash  . Lidoderm Other (See Comments)    "made me act weird"  . Pyrithione Zinc Rash    Patient Measurements: Height: 6\' 2"  (188 cm) Weight: 88 kg (194 lb 0.1 oz) IBW/kg (Calculated) : 82.2   Vital Signs: Temp: 98.9 F (37.2 C) (04/12 0426) Temp Source: Axillary (04/12 0426) BP: 126/87 (04/12 0800) Pulse Rate: 74 (04/12 0800)  Labs: Recent Labs    05/14/20 1628 05/15/20 7939 05/15/20 0637 05/16/20 0057 05/17/20 0201  HGB  --  10.5*   < > 9.5* 9.5*  HCT  --  33.8*  --  30.2* 31.0*  PLT  --  275  --  233 223  HEPARINUNFRC  --   --   --   --  0.95*  CREATININE  --  1.01  --  0.97 0.91  CKTOTAL 27*  --   --   --   --    < > = values in this interval not displayed.    Estimated Creatinine Clearance: 82.8 mL/min (by C-G formula based on SCr of 0.91 mg/dL).   Medical History: Past Medical History:  Diagnosis Date  . Allergy   . Barrett's esophageal ulceration   . BPH (benign prostatic hyperplasia)   . CAD (coronary artery disease)    sees Dr. Mar Daring  . Cataract    both eyes removed   . Colonic polyp   . Concussion with loss of  consciousness of 30 minutes or less   . Contact dermatitis   . Contact with or exposure to venereal diseases   . COPD (chronic obstructive pulmonary disease) ([place])    sees Dr. Kara Mead   . Diverticulosis of colon   . Dysphagia   . GERD (gastroesophageal reflux disease)    past hx- on meds   . HNP (herniated nucleus pulposus), lumbar    recurrent  . Hyperlipidemia   . Hypertension   . Myocardial infarction [facility])    2011- stent placed  . Nephrolithiasis   . Rheumatoid arthritis [facility])    sees Dr. Gavin Pound   . SOB (shortness of breath)     Assessment: 75 yo male admitted on 05/09/2020 with tachycardia. D-dimer elevated. Lower extremity doppler founds DVT involving the left femoral vein, left popliteal vein, left posterior tibial veins, and left peroneal vein. CTA found progressive PE. Patient has been receiving therapeutic enoxaparin since 4/7. Pharmacy consulted to transition to heparin last night for possible LP. No plans for LP and pharmacy transitioned to resume enoxaparin.   Goal of Therapy:  Monitor platelets by anticoagulation protocol: Yes   Plan:  Stop heparin when first dose enoxaparin given -  RN aware  Start enoxaparin 90mg  q12h (1mg /kg) Follow up oral AC plans    Cristela Felt, PharmD Clinical Pharmacist  05/17/2020,11:37 AM

## 2020-05-18 ENCOUNTER — Other Ambulatory Visit (HOSPITAL_COMMUNITY): Payer: Self-pay

## 2020-05-18 DIAGNOSIS — R509 Fever, unspecified: Secondary | ICD-10-CM | POA: Diagnosis not present

## 2020-05-18 DIAGNOSIS — A419 Sepsis, unspecified organism: Secondary | ICD-10-CM | POA: Diagnosis not present

## 2020-05-18 DIAGNOSIS — N400 Enlarged prostate without lower urinary tract symptoms: Secondary | ICD-10-CM | POA: Diagnosis not present

## 2020-05-18 DIAGNOSIS — J189 Pneumonia, unspecified organism: Secondary | ICD-10-CM | POA: Diagnosis not present

## 2020-05-18 LAB — QUANTIFERON-TB GOLD PLUS (RQFGPL)
QuantiFERON Mitogen Value: 5.96 IU/mL
QuantiFERON Nil Value: 1.65 IU/mL
QuantiFERON TB1 Ag Value: 1.59 IU/mL
QuantiFERON TB2 Ag Value: 1.58 IU/mL

## 2020-05-18 LAB — BLASTOMYCES ANTIGEN: Blastomyces Antigen: NOT DETECTED ng/mL

## 2020-05-18 LAB — CBC
HCT: 27.4 % — ABNORMAL LOW (ref 39.0–52.0)
Hemoglobin: 8.6 g/dL — ABNORMAL LOW (ref 13.0–17.0)
MCH: 28.4 pg (ref 26.0–34.0)
MCHC: 31.4 g/dL (ref 30.0–36.0)
MCV: 90.4 fL (ref 80.0–100.0)
Platelets: 218 10*3/uL (ref 150–400)
RBC: 3.03 MIL/uL — ABNORMAL LOW (ref 4.22–5.81)
RDW: 18.6 % — ABNORMAL HIGH (ref 11.5–15.5)
WBC: 8.2 10*3/uL (ref 4.0–10.5)
nRBC: 0 % (ref 0.0–0.2)

## 2020-05-18 LAB — CULTURE, BLOOD (ROUTINE X 2)
Culture: NO GROWTH
Culture: NO GROWTH
Special Requests: ADEQUATE
Special Requests: ADEQUATE

## 2020-05-18 LAB — PATHOLOGIST SMEAR REVIEW

## 2020-05-18 LAB — QUANTIFERON-TB GOLD PLUS: QuantiFERON-TB Gold Plus: NEGATIVE

## 2020-05-18 LAB — BASIC METABOLIC PANEL
Anion gap: 7 (ref 5–15)
BUN: 22 mg/dL (ref 8–23)
CO2: 24 mmol/L (ref 22–32)
Calcium: 9.1 mg/dL (ref 8.9–10.3)
Chloride: 107 mmol/L (ref 98–111)
Creatinine, Ser: 0.86 mg/dL (ref 0.61–1.24)
GFR, Estimated: >60 mL/min (ref >60)
Glucose, Bld: 242 mg/dL — ABNORMAL HIGH (ref 70–99)
Potassium: 3.7 mmol/L (ref 3.5–5.1)
Sodium: 138 mmol/L (ref 135–145)

## 2020-05-18 LAB — C-REACTIVE PROTEIN: CRP: 5.8 mg/dL — ABNORMAL HIGH (ref <1.0)

## 2020-05-18 LAB — GLUCOSE, CAPILLARY
Glucose-Capillary: 180 mg/dL — ABNORMAL HIGH (ref 70–99)
Glucose-Capillary: 215 mg/dL — ABNORMAL HIGH (ref 70–99)

## 2020-05-18 LAB — CEA: CEA: 4.7 ng/mL (ref 0.0–4.7)

## 2020-05-18 MED ORDER — APIXABAN (ELIQUIS) VTE STARTER PACK (10MG AND 5MG)
ORAL_TABLET | ORAL | 0 refills | Status: DC
  Filled 2020-05-18: qty 74, 30d supply, fill #0

## 2020-05-18 MED ORDER — ALBUTEROL SULFATE HFA 108 (90 BASE) MCG/ACT IN AERS
2.0000 | INHALATION_SPRAY | RESPIRATORY_TRACT | 0 refills | Status: AC | PRN
  Filled 2020-05-18: qty 18, 20d supply, fill #0

## 2020-05-18 MED ORDER — OMEPRAZOLE 40 MG PO CPDR
40.0000 mg | DELAYED_RELEASE_CAPSULE | Freq: Every day | ORAL | 0 refills | Status: AC
  Filled 2020-05-18: qty 30, 30d supply, fill #0

## 2020-05-18 MED ORDER — APIXABAN 5 MG PO TABS
10.0000 mg | ORAL_TABLET | Freq: Two times a day (BID) | ORAL | Status: DC
  Administered 2020-05-18: 10 mg via ORAL
  Filled 2020-05-18: qty 2

## 2020-05-18 MED ORDER — APIXABAN 5 MG PO TABS: 5.0000 mg | ORAL_TABLET | Freq: Two times a day (BID) | ORAL | Status: DC

## 2020-05-18 MED ORDER — BUDESONIDE-FORMOTEROL FUMARATE 160-4.5 MCG/ACT IN AERO
2.0000 | INHALATION_SPRAY | Freq: Two times a day (BID) | RESPIRATORY_TRACT | 0 refills | Status: AC
  Filled 2020-05-18: qty 10.2, 30d supply, fill #0

## 2020-05-18 MED ORDER — TAMSULOSIN HCL 0.4 MG PO CAPS
0.4000 mg | ORAL_CAPSULE | Freq: Every day | ORAL | 0 refills | Status: AC
  Filled 2020-05-18: qty 30, 30d supply, fill #0

## 2020-05-18 MED ORDER — SIMVASTATIN 20 MG PO TABS
20.0000 mg | ORAL_TABLET | Freq: Every day | ORAL | 0 refills | Status: AC
  Filled 2020-05-18: qty 30, 30d supply, fill #0

## 2020-05-18 MED ORDER — BOOST PLUS PO LIQD: 237.0000 mL | Freq: Three times a day (TID) | ORAL | 0 refills | Status: AC

## 2020-05-18 MED ORDER — PREDNISONE 10 MG PO TABS
30.0000 mg | ORAL_TABLET | Freq: Two times a day (BID) | ORAL | 0 refills | Status: AC
Start: 2020-05-18 — End: ?
  Filled 2020-05-18: qty 180, 30d supply, fill #0

## 2020-05-18 MED ORDER — METOPROLOL TARTRATE 25 MG PO TABS
25.0000 mg | ORAL_TABLET | Freq: Two times a day (BID) | ORAL | 0 refills | Status: DC
  Filled 2020-05-18: qty 60, 30d supply, fill #0

## 2020-05-18 MED ORDER — SPIRIVA RESPIMAT 2.5 MCG/ACT IN AERS
2.0000 | INHALATION_SPRAY | Freq: Every day | RESPIRATORY_TRACT | 0 refills | Status: DC
  Filled 2020-05-18: qty 4, 30d supply, fill #0

## 2020-05-18 MED ORDER — SPIRIVA RESPIMAT 2.5 MCG/ACT IN AERS: 2.0000 | INHALATION_SPRAY | Freq: Every day | RESPIRATORY_TRACT | 0 refills | Status: AC

## 2020-05-18 NOTE — Consult Note (Addendum)
   [facility] CM Inpatient Consult   05/18/2020  Tyrone Roman Jul 18, 1945 [id number]   Patient screened for high risk score for unplanned readmission. Chart reviewed to assess for potential [place] [employer] community service needs. Spoke with patient at bedside and explained [place] outpatient care management program. Patient verifies that PCP is Dr. Sarajane Jews at [facility]. Embedded case management coordination available at primary physician office.   Patient verbalizes agreement for post hospital follow up by RN case manager. States that he lives alone and would appreciate assistance.  Of note, [facility] Care Management services does not replace or interfere with any services that are arranged by inpatient case management or social work.  Netta Cedars, MSN, [facility] Liaison Nurse Mobile Phone [phone]  Toll free office [phone]

## 2020-05-18 NOTE — TOC Transition Note (Signed)
Transition of Care [facility]) - CM/SW Discharge Note   Patient Details  Name: Tyrone Roman MRN: [id number] Date of Birth: 1945/07/01  Transition of Care [facility]) CM/SW Contact:  Carles Collet, RN Phone Number: 05/18/2020, 10:21 AM   Clinical Narrative:    Tyrone Roman w daughter Tyrone Roman, she will provide transport home this afternoon. She states [employer] has called and said the DME will be delivered to the home today. Portable oxygen is in room. Eliquis to be filled by [place] and sent home with family     Final next level of care: [place] Barriers to Discharge: No Barriers Identified   Patient Goals and CMS Choice Patient states their goals for this hospitalization and ongoing recovery are:: to go home CMS Medicare.gov Compare Post Acute Care list provided to:: Other (Comment Required) Choice offered to / list presented to : [name]  Discharge Placement                       Discharge Plan and Services   Discharge Planning Services: CM Consult Post Acute Care Choice: [place]          DME Arranged: Oxygen,Hospital bed DME Agency: [employer] Date DME Agency Contacted: 05/18/20 Time DME Agency Contacted: [phone] Representative spoke with at DME Agency: [place]: RN,PT,OT [place] Agency: [place] Date [place]: 05/18/20 Time [place]: 65 Representative spoke with at [place]: [place] ([place]) Interventions     Readmission Risk Interventions No flowsheet data found.

## 2020-05-18 NOTE — Discharge Summary (Signed)
PATIENT DETAILS Name: REMI LOPATA Age: 75 y.o. Sex: male Date of Birth: 1945/04/16 MRN: [id number]. Admitting Physician: Karmen Bongo, MD PCP:Fry, Ishmael Holter, MD  Admit Date: 05/09/2020 Discharge date: 05/18/2020  Recommendations for Outpatient Follow-up:  1. Follow up with PCP in 1-2 weeks 2. Please obtain CMP/CBC in one week 3. Please ensure follow-up with rheumatology. 4. Please follow-up on the following results: ANCA antibodies, SPEP, TB Gold QuantiFERON, blastomycosis antigen, Coccidioides antibodies  Admitted From:  Home  Disposition: Home with home health services (refused SNF)  Home Health: Yes  Equipment/Devices: None  Discharge Condition: Stable  CODE STATUS: Full code  Diet recommendation:  Diet Order            Diet - low sodium heart healthy           Diet Carb Modified Fluid consistency: Thin; Room service appropriate? Yes  Diet effective now                  Brief Summary: See H&P, Labs, Consult and Test reports for all details in brief, patient is a 75 year old male with history of RA, CAD s/p PCI, HTN, HLD, COPD, BPH-admitted for evaluation of tachycardia-he was found to have PE/DVT-Hospital course was complicated by persistent fever/FUO-requiring extensive work-up including ID evaluation.  See below for further details.   Brief Hospital Course: Acute hypoxic respiratory failure: Multifactorial etiology-due to PE, pneumonia, mild volume overload due to HFpEF with exacerbation.  Significantly improved-on room air-after treatment with anticoagulation, antibiotics and as needed dosing of diuretics.   Acute DVT/PE: Started on empiric Lovenox-since no procedures planned-patient anxious to go home today-we will switch to Eliquis on discharge.  Suspect needs at least 6 months of uninterrupted anticoagulation.  Please consider outpatient hematology evaluation before stopping anticoagulation.  FUO-SIRS: Hospital course complicated by intermittent  fever-extensive work-up and ID evaluation completed.  No source of infection apparent-extensive cultures negative-numerous imaging imaging including CT abdomen/pelvis, MRI C-spine/T-spine) negative for any sort of infection.  ID has discontinued antimicrobial therapy several days ago.  Fever and mental status seem to respond to high-dose steroids-hence suspicion that patient probably either has some residual inflammation post COVID or has ongoing inflammation related to his underlying rheumatoid arthritis.  Perhaps-could be related to underlying clot burden from DVT/PE as well.  However-Per prior documentation-whenever steroid dosage was tapered down-fever reoccurred-hence underlying autoimmune/post COVID related inflammation have been considered more likely.  Prior MD spoke with patient's primary rheumatologist-Dr. Hawks-recommendations were to keep on prednisone 30 mg twice daily (has been afebrile on this regimen) table patient sees outpatient neurology.  This MD-spoke with ID MD-Dr. Lucianne Lei dam-no further recommendations-apart from continuing steroids till seen by outpatient rheumatology.  Acute metabolic encephalopathy: Resolved-likely metabolic encephalopathy in the setting of fever.  Better with steroids.  Punctate acute infarct within the right parietal lobe on MRI brain: Felt to be a artifact-and not CVA.  Neurology followed closely during this hospital stay.  Acute on chronic diastolic heart failure: Euvolemic on exam.  Responded well to diuretics.  Rheumatoid arthritis: On chronic prednisone-see above regarding steroids.  Patient to follow with primary rheumatologist for further care.  Apparently he also is on Rituxan as an outpatient.  HTN: BP stable  HLD: Remains on statin  Prediabetes: Continue outpatient follow-up with PCP-A1c 6.3.  Deconditioning/debility: Refuses SNF-being discharged with home health services.  RN pressure injury documentation: Pressure Injury 04/03/20 Sacrum Medial  blanchable redness on sacrum (Active)  04/03/20 0341  Location: Sacrum  Location Orientation: Medial  Staging:   Wound Description (Comments): blanchable redness on sacrum  Present on Admission: Yes    Discharge Diagnoses:  Principal Problem:   Sepsis due to pneumonia [facility]) Active Problems:   Hyperlipidemia   BPH (benign prostatic hyperplasia)   Sepsis ([place])   Stage 3b chronic kidney disease (HCC)   Post-COVID-19 syndrome manifesting as chronic cough   COPD without exacerbation (HCC)   FUO (fever of unknown origin)   Weakness of both lower extremities   Discharge Instructions:  Activity:  As tolerated with Full fall precautions use walker/cane & assistance as needed  Discharge Instructions    Call MD for:  difficulty breathing, headache or visual disturbances   Complete by: As directed    Diet - low sodium heart healthy   Complete by: As directed    Discharge instructions   Complete by: As directed    Follow with Primary MD  [name], MD in 1-2 weeks  Please follow with Dr. Lance Morin primary rheumatologist and 1-2 weeks  Stay on prednisone 30 mg p.o. twice daily until seen by your primary rheumatologist  Some of your blood work is still pending-please ask your primary care practitioner to follow-up on these results.  Please get a complete blood count and chemistry panel checked by your Primary MD at your next visit, and again as instructed by your Primary MD.  Get Medicines reviewed and adjusted: Please take all your medications with you for your next visit with your Primary MD  Laboratory/radiological data: Please request your Primary MD to go over all hospital tests and procedure/radiological results at the follow up, please ask your Primary MD to get all Hospital records sent to his/her office.  In some cases, they will be blood work, cultures and biopsy results pending at the time of your discharge. Please request that your primary care M.D.  follows up on these results.  Also Note the following: If you experience worsening of your admission symptoms, develop shortness of breath, life threatening emergency, suicidal or homicidal thoughts you must seek medical attention immediately by calling 911 or calling your MD immediately  if symptoms less severe.  You must read complete instructions/literature along with all the possible adverse reactions/side effects for all the Medicines you take and that have been prescribed to you. Take any new Medicines after you have completely understood and accpet all the possible adverse reactions/side effects.   Do not drive when taking Pain medications or sleeping medications (Benzodaizepines)  Do not take more than prescribed Pain, Sleep and Anxiety Medications. It is not advisable to combine anxiety,sleep and pain medications without talking with your primary care practitioner  Special Instructions: If you have smoked or chewed Tobacco  in the last 2 yrs please stop smoking, stop any regular Alcohol  and or any Recreational drug use.  Wear Seat belts while driving.  Please note: You were cared for by a hospitalist during your hospital stay. Once you are discharged, your primary care physician will handle any further medical issues. Please note that NO REFILLS for any discharge medications will be authorized once you are discharged, as it is imperative that you return to your primary care physician (or establish a relationship with a primary care physician if you do not have one) for your post hospital discharge needs so that they can reassess your need for medications and monitor your lab values.   Please check your temperature 2-3 times a day-and keep a record of these readings.  Please take  it to your next appointment with your primary care practitioner   Increase activity slowly   Complete by: As directed      Allergies as of 05/18/2020      Reactions   Cephalexin Shortness Of Breath, Rash,  Other (See Comments)   Tolerated Augmentin 2015 and 2017 (12-2017). Tolerated nafcillin 12/2017 PATIENT HAS HAD A PCN REACTION WITH IMMEDIATE RASH, FACIAL/TONGUE/THROAT SWELLING, SOB, OR LIGHTHEADEDNESS WITH HYPOTENSION:  #  #  YES  #  #  Has patient had a PCN reaction causing severe rash involving mucus membranes or skin necrosis: No Has patient had a PCN reaction that required hospitalization: No Has patient had a PCN reaction occurring within the last 10 years: No If all of the above answers are "NO", then may proceed   Clarithromycin Shortness Of Breath, Rash   Certolizumab Pegol Hives   Tocilizumab Hives   Doxycycline Rash   Hydroxyzine Rash   Lidoderm Other (See Comments)   "made me act weird"   Pyrithione Zinc Rash      Medication List    STOP taking these medications   aspirin 81 MG chewable tablet   budesonide 0.5 MG/2ML nebulizer solution Commonly known as: PULMICORT   EUCERIN INTENSIVE REPAIR EX   Lidocaine 4 % Ptch     TAKE these medications   acetaminophen 325 MG tablet Commonly known as: TYLENOL Take 650 mg by mouth every 6 (six) hours as needed for headache, mild pain or fever.   albuterol 108 (90 Base) MCG/ACT inhaler Commonly known as: Ventolin HFA Inhale 2 puffs into the lungs every 4 (four) hours as needed for wheezing or shortness of breath.   Apixaban Starter Pack (10mg  and 5mg ) Commonly known as: ELIQUIS STARTER PACK Take as directed on package: start with two-5mg  tablets twice daily for 7 days. On day 8, switch to one-5mg  tablet twice daily.   budesonide-formoterol 160-4.5 MCG/ACT inhaler Commonly known as: Symbicort Inhale 2 puffs into the lungs in the morning and at bedtime.   Icy Hot 5 % Ptch Generic drug: Menthol Apply 1 patch topically 2 (two) times daily. Apply to lower back.   lactose free nutrition Liqd Take 237 mLs by mouth 3 (three) times daily with meals.   melatonin 5 MG Tabs Take 5 mg by mouth at bedtime.   metoprolol  tartrate 25 MG tablet Commonly known as: LOPRESSOR Take 1 tablet (25 mg total) by mouth 2 (two) times daily.   omeprazole 40 MG capsule Commonly known as: PRILOSEC Take 1 capsule (40 mg total) by mouth daily.   predniSONE 10 MG tablet Commonly known as: DELTASONE Take 3 tablets (30 mg total) by mouth every 12 (twelve) hours.   simvastatin 20 MG tablet Commonly known as: ZOCOR Take 1 tablet (20 mg total) by mouth daily.   Spiriva Respimat 2.5 MCG/ACT Aers Generic drug: Tiotropium Bromide Monohydrate Inhale 2 puffs into the lungs daily.   tamsulosin 0.4 MG Caps capsule Commonly known as: FLOMAX Take 1 capsule (0.4 mg total) by mouth daily. Start taking on: May 19, 2020            Durable Medical Equipment  (From admission, onward)         Start     Ordered   05/16/20 1503  For home use only DME lightweight manual wheelchair with seat cushion  Once       Comments: Patient suffers from pneumonia, hypoxia which impairs their ability to perform daily activities like [id number] in the home.  A walking [id number]:[id number] will not resolve  issue with performing activities of daily living. A wheelchair will allow patient to safely perform daily activities. Patient is not able to propel themselves in the home using a standard weight wheelchair due to [id number]. Patient can self propel in the lightweight wheelchair. Length of need 6 months. Accessories: elevating leg rests (ELRs), wheel locks, extensions and anti-tippers.   05/16/20 1504   05/16/20 1502  For home use only DME Hospital bed  Once       Question Answer Comment  Length of Need 6 Months   Patient has (list medical condition): tachycardia, hypoxia, pneumonia, generalized weakness   The above medical condition requires: Patient requires the ability to reposition frequently   Head must be elevated greater than: 30 degrees   Bed type Semi-electric   Trapeze Bar Yes   Support Surface: Gel Overlay      05/16/20 1504    05/16/20 1500  For home use only DME Walker rolling  Once       Question Answer Comment  Walker: With [place]   Patient needs a walker to treat with the following condition Pneumonia   Patient needs a walker to treat with the following condition Hypoxia      05/16/20 1504   05/16/20 1435  For home use only DME oxygen  Once       Question Answer Comment  Length of Need 6 Months   Mode or (Route) Nasal cannula   Liters per Minute 2   Oxygen conserving device Yes   Oxygen delivery system Gas      05/16/20 1434          Follow-up Information    [facility] Follow up.   Specialty: Home Health Services Why: For home health services Contact information: [place] [address] [phone]        [facility] Follow up.   Specialty: Hospice and Palliative Medicine Why: For outpatient palliative services Contact information: [place] [employer] [place] [facility] Follow up.   Why: [employer] was called and they will be providing a hospital bed, trapeze, rolling walker, home oxygen, and wheelchair. Contact information: [address] [place] [address] [phone]        [name], MD. Schedule an appointment as soon as possible for a visit in 1 week(s).   Specialty: Family Medicine Contact information: [place] [address] [phone]        Werner Lean, MD Follow up in 1 month(s).   Specialty: Cardiology Contact information: [address] [place] [address] [phone]        Gavin Pound, MD. Schedule an appointment as soon as possible for a visit in 1 week(s).   Specialty: Rheumatology Contact information: [address] [place] [address] [phone]              Allergies  Allergen Reactions  . Cephalexin Shortness Of Breath, Rash and Other  (See Comments)    Tolerated Augmentin 2015 and 2017 (12-2017). Tolerated nafcillin 12/2017 PATIENT HAS HAD A PCN REACTION WITH IMMEDIATE RASH, FACIAL/TONGUE/THROAT SWELLING, SOB, OR LIGHTHEADEDNESS WITH HYPOTENSION:  #  #  YES  #  #  Has patient had a PCN reaction causing severe rash involving mucus membranes or skin necrosis: No Has patient had a PCN reaction  that required hospitalization: No Has patient had a PCN reaction occurring within the last 10 years: No If all of the above answers are "NO", then may proceed  . Clarithromycin Shortness Of Breath and Rash  . Certolizumab Pegol Hives  . Tocilizumab Hives  . Doxycycline Rash  . Hydroxyzine Rash  . Lidoderm Other (See Comments)    "made me act weird"  . Pyrithione Zinc Rash    Consultations: Pall.Care, ID ,neurology    Other Procedures/Studies: CT ANGIO CHEST PE W OR WO CONTRAST  Result Date: 05/14/2020 CLINICAL DATA:  High probability sepsis due to pneumonia EXAM: CT ANGIOGRAPHY CHEST WITH CONTRAST TECHNIQUE: Multidetector CT imaging of the chest was performed using the standard protocol during bolus administration of intravenous contrast. Multiplanar CT image reconstructions and MIPs were obtained to evaluate the vascular anatomy. CONTRAST:  61mL OMNIPAQUE IOHEXOL 350 MG/ML SOLN COMPARISON:  04/02/2020 FINDINGS: Cardiovascular: Satisfactory opacification by motion of pulmonary artifact. Arteries although limited there is a convincing branching pulmonary artery filling defect affecting segmental vessels in the basal right lower lobe as marked on series 6. Additional peripheral emboli could be present and obscured by the degree of motion artifact. No central embolism is seen. No evidence of right heart strain. Heart size is normal. No pericardial effusion. Atherosclerosis of the coronaries. Mediastinum/Nodes: Negative for adenopathy or mass. Lungs/Pleura: Chronic lung disease with reticulation. Bilateral pleural plaque appearance without  calcification. There is increased ground-glass density scattered in the bilateral lungs, greatest in the right upper lobe. No interlobular septal thickening or pleural fluid to implicate failure. Mild centrilobular emphysema. Upper Abdomen: Negative Musculoskeletal: No unexpected finding Review of the MIP images confirms the above findings. Critical Value/emergent results were called by telephone at the time of interpretation on 05/14/2020 at 8:35 am to provider Elgergawy, who verbally acknowledged these results. IMPRESSION: 1. Positive for segmental pulmonary embolism in the right lower lobe. Additional pulmonary emboli could be obscured by the degree of motion artifact. 2. Background of chronic opacities related to recent COVID infection. There is also new ground-glass opacity especially in the right upper lobe suggesting a superimposed acute pneumonia. 3. Aortic Atherosclerosis (ICD10-I70.0).  Coronary atherosclerosis. Electronically Signed   By: Monte Fantasia M.D.   On: 05/14/2020 08:35   MR BRAIN W WO CONTRAST  Result Date: 05/16/2020 CLINICAL DATA:  Delirium; fever with unknown origin, altered mental status, evaluate for encephalitis. EXAM: MRI HEAD WITHOUT AND WITH CONTRAST TECHNIQUE: Multiplanar, multiecho pulse sequences of the brain and surrounding structures were obtained without and with intravenous contrast. CONTRAST:  72mL GADAVIST GADOBUTROL 1 MMOL/ML IV SOLN COMPARISON:  Prior head CT examinations 04/02/2020 and earlier. Brain MRI 12/13/2017. FINDINGS: Brain: Mild cerebral and cerebellar atrophy. AP punctate focus of restricted diffusion is questioned within the right parietal lobe (versus artifact) (series 3, image 33). Additionally, there is apparent subtle restricted diffusion along the midline callosal splenium, slightly eccentric to the left. A few small foci of T2/FLAIR hyperintensity within the bilateral cerebral white matter and right thalamus are nonspecific, but likely reflect minimal  chronic small vessel ischemic disease. Redemonstrated left retrocerebellar arachnoid cyst. There is no acute infarct. No evidence of intracranial mass. No chronic intracranial blood products. No extra-axial fluid collection. No midline shift. No abnormal intracranial enhancement. Vascular: Expected proximal arterial flow voids. Skull and upper cervical spine: No focal marrow lesion. Incompletely assessed cervical spondylosis. Suspected C4-C5 vertebral body ankylosis. Sinuses/Orbits: Visualized orbits show no acute finding. Bilateral lens replacements. Mild paranasal sinus mucosal thickening, most notably within  the bilateral ethmoid air cells. Small left maxillary sinus mucous retention cyst. Other: Right mastoid effusion. IMPRESSION: Punctate acute infarct versus artifact within the right parietal lobe. Additionally, there is apparent subtle restricted diffusion within the midline callosal splenium, slightly eccentric to the left. This may reflect a small focus of acute ischemia or artifact. Alternatively, this may reflect a cytotoxic lesion of the corpus callosum (which has a broad range of potential etiologies and clinical associations), although the appearance would be atypical for this entity. Background mild parenchymal atrophy and chronic small vessel ischemic disease. Mild paranasal sinus disease as described. Right mastoid effusion. Electronically Signed   By: Kellie Simmering DO   On: 05/16/2020 18:51   MR CERVICAL SPINE W WO CONTRAST  Result Date: 05/17/2020 CLINICAL DATA:  Initial screening for possible epidural abscess. EXAM: MRI CERVICAL SPINE WITHOUT AND WITH CONTRAST TECHNIQUE: Multiplanar and multiecho pulse sequences of the cervical spine, to include the craniocervical junction and cervicothoracic junction, were obtained without and with intravenous contrast. CONTRAST:  1mL GADAVIST GADOBUTROL 1 MMOL/ML IV SOLN COMPARISON:  Prior MRI from 03/24/2010. FINDINGS: Alignment: Reversal of the normal  cervical lordosis with apex at C3-4. Trace anterolisthesis of C3 on C4 and C7 on T1, chronic and facet mediated. Vertebrae: Vertebral body height maintained without acute or chronic fracture. C4 and C5 vertebral bodies are largely ankylosed, likely degenerative. Bone marrow signal intensity within normal limits. No discrete or worrisome osseous lesions. No abnormal marrow edema or enhancement. No findings to suggest osteomyelitis discitis or septic arthritis. Cord: Normal signal and morphology. No epidural abscess or other collection. Posterior Fossa, vertebral arteries, paraspinal tissues: Probable benign retro cerebellar arachnoid cyst noted. Visualized brain and posterior fossa otherwise unremarkable. Craniocervical junction within normal limits. Paraspinous and prevertebral soft tissues normal. Normal flow voids seen within the vertebral arteries bilaterally. Disc levels: C2-C3: Negative interspace. Right worse than left facet hypertrophy. No spinal stenosis. Mild right C3 foraminal narrowing. Left neural foramina remains patent. C3-C4: Broad-based posterior disc osteophyte flattens and effaces the ventral thecal sac. Mild flattening of the ventral cord without cord signal changes. Mild spinal stenosis. Superimposed bilateral facet hypertrophy with left greater than right uncovertebral spurring. Severe left with moderate right C4 foraminal stenosis. C4-C5: C4 and C5 vertebral bodies are largely ankylosed. Broad posterior endplate osseous ridging flattens and effaces the ventral thecal sac. Mild spinal stenosis without cord impingement. Moderate right worse than left C5 foraminal stenosis. C5-C6: Degenerative intervertebral disc space narrowing with diffuse disc osteophyte complex. Mild flattening of the ventral thecal sac without significant spinal stenosis. Moderate left worse than right C6 foraminal narrowing. C6-C7: Degenerative intervertebral disc space narrowing with diffuse disc osteophyte complex. No  significant spinal stenosis. Severe left with moderate right C7 foraminal stenosis. C7-T1: Trace anterolisthesis. No significant disc bulge. Moderate bilateral facet and ligament flavum hypertrophy. No significant spinal stenosis. Foramina remain patent. IMPRESSION: 1. No evidence for osteomyelitis discitis or septic arthritis within the cervical spine. No epidural abscess or other infection. 2. Multilevel cervical spondylosis with resultant mild spinal stenosis at C3-4 and C4-5. 3. Multifactorial degenerative changes with resultant multilevel foraminal narrowing as above. Notable findings include severe left with moderate right C4 foraminal stenosis, moderate bilateral C5 and C6 foraminal narrowing, with severe left and moderate right C7 foraminal stenosis. Electronically Signed   By: Jeannine Boga M.D.   On: 05/17/2020 21:40   MR THORACIC SPINE W WO CONTRAST  Result Date: 05/17/2020 CLINICAL DATA:  Initial screening for possible infection, epidural abscess. EXAM:  MRI THORACIC WITHOUT AND WITH CONTRAST TECHNIQUE: Multiplanar and multiecho pulse sequences of the thoracic spine were obtained without and with intravenous contrast. CONTRAST:  80mL GADAVIST GADOBUTROL 1 MMOL/ML IV SOLN COMPARISON:  None available. FINDINGS: Alignment: Straightening of the normal thoracic kyphosis. No listhesis. Vertebrae: Vertebral body height maintained without acute or chronic fracture. Bone marrow signal intensity within normal limits. Few scattered benign hemangiomata noted. No worrisome osseous lesions. No abnormal marrow edema or enhancement. No findings to suggest osteomyelitis discitis or septic arthritis. Cord: Normal signal and morphology. No epidural abscess or other collection. No abnormal enhancement. Paraspinal and other soft tissues: Paraspinous soft tissues demonstrate no acute finding. Small layering bilateral pleural effusions noted, left slightly larger than right. T2 hyperintense benign appearing cyst  partially visualized extending from the right kidney. Visualized visceral structures otherwise grossly unremarkable. Disc levels: T1-2:  Unremarkable. T2-3: Tiny right paracentral disc extrusion with superior migration. Mild right-sided facet hypertrophy. No spinal stenosis. Foramina remain patent. T3-4: Minimal disc bulge. No spinal stenosis. Foramina remain patent. T4-5: Normal interspace. Mild right-sided facet hypertrophy. No stenosis. T5-6:  Normal interspace.  Mild facet hypertrophy.  No stenosis. T6-7:  Unremarkable. T7-8: Mild degenerative intervertebral disc space narrowing with disc bulge. No spinal stenosis. Foramina remain patent. T8-9: Mild disc bulge. No spinal stenosis. Foramina remain patent. T9-10: Mild disc bulge with posterior element hypertrophy. No significant spinal stenosis. Mild bilateral foraminal narrowing. T10-11: Diffuse disc bulge, asymmetric to the right. Moderate facet and ligament flavum hypertrophy. Resultant mild to moderate spinal stenosis with mild cord flattening. No cord signal changes. Moderate bilateral foraminal narrowing. T11-12: Negative interspace. Bilateral facet hypertrophy. No significant spinal stenosis. Mild bilateral foraminal narrowing. T12-L1: Disc desiccation with small endplate Schmorl's node deformities. No stenosis. IMPRESSION: 1. No MRI evidence for osteomyelitis discitis or septic arthritis within the thoracic spine. No epidural abscess or other infection. 2. Disc bulging with facet hypertrophy at T10-11 with resultant mild to moderate spinal stenosis and moderate bilateral foraminal narrowing. 3. Additional mild spondylosis elsewhere within the thoracic spine without significant stenosis. 4. Small layering bilateral pleural effusions, left slightly larger than right. Electronically Signed   By: Jeannine Boga M.D.   On: 05/17/2020 21:52   MR Lumbar Spine W Wo Contrast  Result Date: 05/13/2020 CLINICAL DATA:  Fever, lumbar spine tenderness EXAM: MRI  LUMBAR SPINE WITHOUT AND WITH CONTRAST TECHNIQUE: Multiplanar and multiecho pulse sequences of the lumbar spine were obtained without and with intravenous contrast. CONTRAST:  55mL GADAVIST GADOBUTROL 1 MMOL/ML IV SOLN COMPARISON:  04/03/2018 FINDINGS: Segmentation:  Standard. Alignment:  Stable. Vertebrae: There is now fusion across the L1-L2 disc space. There is no new marrow edema or enhancement. Multilevel degenerative plate irregularity and Schmorl's nodes. Conus medullaris and cauda equina: Conus extends to the L1-L2 level. Conus and cauda equina appear normal. No intrathecal enhancement. Paraspinal and other soft tissues: Mild lower lumbar paraspinal edema. No evidence of collection. Disc levels: L1-L2: Prior right laminectomy. Stable retrolisthesis with bridging endplate osteophytes effacing the ventral thecal sac. Similar mild to moderate foraminal stenosis. L2-L3: Disc bulge. Facet arthropathy with ligamentum flavum infolding. Slightly increased mild canal stenosis. Similar mild foraminal stenosis. L3-L4: Disc bulge. Facet arthropathy with ligamentum flavum infolding. Similar moderate canal stenosis with effacement of subarticular recesses. Similar mild to moderate left greater than right foraminal stenosis. L4-L5: Disc bulge with superimposed left subarticular/foraminal protrusion. Facet arthropathy with ligamentum flavum infolding. Similar mild canal stenosis. Similar partial effacement of the left subarticular recess. Similar mild foraminal stenosis. L5-S1:  Disc bulge with endplate osteophytic ridging. Facet arthropathy. No canal stenosis. Similar mild foraminal stenosis. IMPRESSION: No evidence of discitis/osteomyelitis. Multilevel degenerative changes as detailed above without substantial change since the prior examination. Electronically Signed   By: Macy Mis M.D.   On: 05/13/2020 14:25   CT ABDOMEN PELVIS W CONTRAST  Result Date: 05/15/2020 CLINICAL DATA:  Sepsis.  Fever of unknown origin.  EXAM: CT ABDOMEN AND PELVIS WITH CONTRAST TECHNIQUE: Multidetector CT imaging of the abdomen and pelvis was performed using the standard protocol following bolus administration of intravenous contrast. CONTRAST:  151mL OMNIPAQUE IOHEXOL 300 MG/ML  SOLN COMPARISON:  04/02/2020.  Chest CTA obtained earlier today. FINDINGS: Lower chest: Progressive pulmonary embolism on the right, with emboli currently demonstrated in the distal right main pulmonary artery and significantly more clot in the intersegmental and proximal right lower lobe pulmonary arteries. Bilateral lung densities and atelectasis with improvement since this morning. Hepatobiliary: Diffuse low density of the liver relative to the spleen. Normal appearing gallbladder. Pancreas: Unremarkable. No pancreatic ductal dilatation or surrounding inflammatory changes. Spleen: Normal in size without focal abnormality. Adrenals/Urinary Tract: Normal appearing adrenal glands. Bilateral renal cysts. Multiple small bilateral renal calculi. No bladder or ureteral calculi are seen. Excreted contrast in the urinary bladder from the CTA earlier today. Stomach/Bowel: Multiple colonic diverticula without evidence of diverticulitis. Unremarkable stomach, small bowel and appendix. Vascular/Lymphatic: Atheromatous arterial calcifications without aneurysm. No enlarged lymph nodes. Two renal arteries on the left. Reproductive: Mildly enlarged prostate gland. Other: Small bilateral inguinal hernias containing fat and very small umbilical artery containing fat. Musculoskeletal: Stable lumbar and lower thoracic spine degenerative changes and fusion of the L1 and L2 vertebral bodies. IMPRESSION: 1. Progressive pulmonary embolism on the right, with emboli currently demonstrated in the distal right main pulmonary artery and significantly more clot in the intersegmental and proximal right lower lobe pulmonary arteries. These results will be called to the ordering clinician or  representative by the Radiologist Assistant, and communication documented in the PACS or [employer]. 2. Lung densities and atelectasis with improvement since this morning. 3. Diffuse hepatic steatosis. 4. Multiple small, nonobstructing bilateral renal calculi. 5. Colonic diverticulosis. Electronically Signed   By: Claudie Revering M.D.   On: 05/15/2020 22:17   VAS [place] IVC/ILIAC (VENOUS ONLY)  Result Date: 05/11/2020 IVC/ILIAC STUDY Indications: DVT, recent COVID Limitations: Air/bowel gas.  Comparison Study: No prior study Performing Technologist: Maudry Mayhew MHA, RDMS, RVT, RDCS  Examination Guidelines: A complete evaluation includes B-mode imaging, spectral Doppler, color Doppler, and power Doppler as needed of all accessible portions of each vessel. Bilateral testing is considered an integral part of a complete examination. Limited examinations for reoccurring indications may be performed as noted.  IVC/Iliac Findings: +----------+------+--------+--------+    IVC    PatentThrombusComments +----------+------+--------+--------+ IVC Mid   patent                 +----------+------+--------+--------+ IVC Distalpatent                 +----------+------+--------+--------+  +-------------------+---------+-----------+---------+-----------+--------+         CIV        RT-PatentRT-ThrombusLT-PatentLT-ThrombusComments +-------------------+---------+-----------+---------+-----------+--------+ Common Iliac Prox                       patent                      +-------------------+---------+-----------+---------+-----------+--------+ Common Iliac Mid  patent                      +-------------------+---------+-----------+---------+-----------+--------+ Common Iliac Distal patent              patent                      +-------------------+---------+-----------+---------+-----------+--------+   +-------------------------+---------+-----------+---------+-----------+--------+            EIV           RT-PatentRT-ThrombusLT-PatentLT-ThrombusComments +-------------------------+---------+-----------+---------+-----------+--------+ External Iliac Vein Prox                      patent                      +-------------------------+---------+-----------+---------+-----------+--------+ External Iliac Vein       patent                                          Distal                                                                    +-------------------------+---------+-----------+---------+-----------+--------+   Summary: IVC/Iliac: No evidence of thrombus in IVC and Iliac veins.  *See table(s) above for measurements and observations.  Electronically signed by Harold Barban MD on 05/11/2020 at 9:55:50 PM.    Final    DG Chest Port 1 View  Result Date: 05/10/2020 CLINICAL DATA:  Short of breath, history of COPD EXAM: PORTABLE CHEST 1 VIEW COMPARISON:  Prior chest x-ray dated yesterday FINDINGS: Stable cardiac and mediastinal contours. Marked interval increase in diffuse interstitial prominence bilaterally now with Kerley B-lines in the periphery. Findings are most consistent with worsening CHF. No large effusion or pneumothorax. No acute osseous abnormality. IMPRESSION: Increasing interstitial pulmonary edema concerning for progressive CHF. Electronically Signed   By: Jacqulynn Cadet M.D.   On: 05/10/2020 07:54   DG Chest Port 1 View  Result Date: 05/09/2020 CLINICAL DATA:  Possible sepsis Fever and cough EXAM: PORTABLE CHEST 1 VIEW COMPARISON:  04/04/2020 FINDINGS: Heart size within normal limits. No pulmonary vascular congestion. Interval improvement in aeration of the lungs with bilateral airspace opacities still remaining. IMPRESSION: Interval improvement in aeration of the lungs with bilateral opacities still remaining consistent with resolving pneumonia. Electronically Signed    By: Miachel Roux M.D.   On: 05/09/2020 12:56   VAS [place] LOWER EXTREMITY VENOUS (DVT)  Result Date: 05/11/2020  Lower Venous DVT Study Indications: Edema, Recent Covid infection, and SOB.  Comparison Study: 03/31/20 negative bilateral lower extremity venous duplex Performing Technologist: Maudry Mayhew MHA, RDMS, RVT, RDCS  Examination Guidelines: A complete evaluation includes B-mode imaging, spectral Doppler, color Doppler, and power Doppler as needed of all accessible portions of each vessel. Bilateral testing is considered an integral part of a complete examination. Limited examinations for reoccurring indications may be performed as noted. The reflux portion of the exam is performed with the patient in reverse Trendelenburg.  +---------+---------------+---------+-----------+----------+--------------+ RIGHT    CompressibilityPhasicitySpontaneityPropertiesThrombus Aging +---------+---------------+---------+-----------+----------+--------------+ CFV      Full           Yes  Yes                                 +---------+---------------+---------+-----------+----------+--------------+ SFJ      Full                                                        +---------+---------------+---------+-----------+----------+--------------+ FV Prox  Full                                                        +---------+---------------+---------+-----------+----------+--------------+ FV Mid   Full                                                        +---------+---------------+---------+-----------+----------+--------------+ FV DistalFull                                                        +---------+---------------+---------+-----------+----------+--------------+ PFV      Full                                                        +---------+---------------+---------+-----------+----------+--------------+ POP      Full           Yes      Yes                                  +---------+---------------+---------+-----------+----------+--------------+ PTV      Full                                                        +---------+---------------+---------+-----------+----------+--------------+ PERO     Full                                                        +---------+---------------+---------+-----------+----------+--------------+ Gastroc  None                    No                   Acute          +---------+---------------+---------+-----------+----------+--------------+   +---------+---------------+---------+-----------+----------+--------------+ LEFT     CompressibilityPhasicitySpontaneityPropertiesThrombus Aging +---------+---------------+---------+-----------+----------+--------------+ CFV      Full  Yes      Yes                                 +---------+---------------+---------+-----------+----------+--------------+ SFJ      Full                                                        +---------+---------------+---------+-----------+----------+--------------+ FV Prox  None                    No                   Acute          +---------+---------------+---------+-----------+----------+--------------+ FV Mid   None                    No                   Acute          +---------+---------------+---------+-----------+----------+--------------+ FV DistalNone                    No                   Acute          +---------+---------------+---------+-----------+----------+--------------+ PFV      Full                                                        +---------+---------------+---------+-----------+----------+--------------+ POP      None                    No                   Acute          +---------+---------------+---------+-----------+----------+--------------+ PTV      None                    No                   Acute           +---------+---------------+---------+-----------+----------+--------------+ PERO     None                    No                   Acute          +---------+---------------+---------+-----------+----------+--------------+     Summary: RIGHT: - Findings consistent with acute deep vein thrombosis involving the right gastrocnemius veins. - No cystic structure found in the popliteal fossa.  LEFT: - Findings consistent with acute deep vein thrombosis involving the left femoral vein, left popliteal vein, left posterior tibial veins, and left peroneal veins. - No cystic structure found in the popliteal fossa.  *See table(s) above for measurements and observations. Electronically signed by Harold Barban MD on 05/11/2020 at 9:56:21 PM.    Final      TODAY-DAY OF DISCHARGE:  Subjective:   [name] today has no headache,no chest abdominal pain,no new  weakness tingling or numbness, feels much better wants to go home today.   Objective:   Blood pressure (!) 139/94, pulse 82, temperature (!) 97.3 F (36.3 C), temperature source Oral, resp. rate 20, height 6\' 2"  (1.88 m), weight 88 kg, SpO2 95 %.  Intake/Output Summary (Last 24 hours) at 05/18/2020 1058 Last data filed at 05/18/2020 0300 Gross per 24 hour  Intake 1851.94 ml  Output 300 ml  Net 1551.94 ml   Filed Weights   05/09/20 1157  Weight: 88 kg    Exam: Awake Alert, Oriented *3, No new F.N deficits, Normal affect [place].AT,PERRAL Supple Neck,No JVD, No cervical lymphadenopathy appriciated.  Symmetrical Chest wall movement, Good air movement bilaterally, CTAB RRR,No Gallops,Rubs or new Murmurs, No Parasternal Heave +ve B.Sounds, Abd Soft, Non tender, No organomegaly appriciated, No rebound -guarding or rigidity. No Cyanosis, Clubbing or edema, No new Rash or bruise   PERTINENT RADIOLOGIC STUDIES: MR BRAIN W WO CONTRAST  Result Date: 05/16/2020 CLINICAL DATA:  Delirium; fever with unknown origin, altered mental status, evaluate for  encephalitis. EXAM: MRI HEAD WITHOUT AND WITH CONTRAST TECHNIQUE: Multiplanar, multiecho pulse sequences of the brain and surrounding structures were obtained without and with intravenous contrast. CONTRAST:  67mL GADAVIST GADOBUTROL 1 MMOL/ML IV SOLN COMPARISON:  Prior head CT examinations 04/02/2020 and earlier. Brain MRI 12/13/2017. FINDINGS: Brain: Mild cerebral and cerebellar atrophy. AP punctate focus of restricted diffusion is questioned within the right parietal lobe (versus artifact) (series 3, image 33). Additionally, there is apparent subtle restricted diffusion along the midline callosal splenium, slightly eccentric to the left. A few small foci of T2/FLAIR hyperintensity within the bilateral cerebral white matter and right thalamus are nonspecific, but likely reflect minimal chronic small vessel ischemic disease. Redemonstrated left retrocerebellar arachnoid cyst. There is no acute infarct. No evidence of intracranial mass. No chronic intracranial blood products. No extra-axial fluid collection. No midline shift. No abnormal intracranial enhancement. Vascular: Expected proximal arterial flow voids. Skull and upper cervical spine: No focal marrow lesion. Incompletely assessed cervical spondylosis. Suspected C4-C5 vertebral body ankylosis. Sinuses/Orbits: Visualized orbits show no acute finding. Bilateral lens replacements. Mild paranasal sinus mucosal thickening, most notably within the bilateral ethmoid air cells. Small left maxillary sinus mucous retention cyst. Other: Right mastoid effusion. IMPRESSION: Punctate acute infarct versus artifact within the right parietal lobe. Additionally, there is apparent subtle restricted diffusion within the midline callosal splenium, slightly eccentric to the left. This may reflect a small focus of acute ischemia or artifact. Alternatively, this may reflect a cytotoxic lesion of the corpus callosum (which has a broad range of potential etiologies and clinical  associations), although the appearance would be atypical for this entity. Background mild parenchymal atrophy and chronic small vessel ischemic disease. Mild paranasal sinus disease as described. Right mastoid effusion. Electronically Signed   By: Kellie Simmering DO   On: 05/16/2020 18:51   MR CERVICAL SPINE W WO CONTRAST  Result Date: 05/17/2020 CLINICAL DATA:  Initial screening for possible epidural abscess. EXAM: MRI CERVICAL SPINE WITHOUT AND WITH CONTRAST TECHNIQUE: Multiplanar and multiecho pulse sequences of the cervical spine, to include the craniocervical junction and cervicothoracic junction, were obtained without and with intravenous contrast. CONTRAST:  46mL GADAVIST GADOBUTROL 1 MMOL/ML IV SOLN COMPARISON:  Prior MRI from 03/24/2010. FINDINGS: Alignment: Reversal of the normal cervical lordosis with apex at C3-4. Trace anterolisthesis of C3 on C4 and C7 on T1, chronic and facet mediated. Vertebrae: Vertebral body height maintained without acute or chronic fracture. C4 and C5 vertebral  bodies are largely ankylosed, likely degenerative. Bone marrow signal intensity within normal limits. No discrete or worrisome osseous lesions. No abnormal marrow edema or enhancement. No findings to suggest osteomyelitis discitis or septic arthritis. Cord: Normal signal and morphology. No epidural abscess or other collection. Posterior Fossa, vertebral arteries, paraspinal tissues: Probable benign retro cerebellar arachnoid cyst noted. Visualized brain and posterior fossa otherwise unremarkable. Craniocervical junction within normal limits. Paraspinous and prevertebral soft tissues normal. Normal flow voids seen within the vertebral arteries bilaterally. Disc levels: C2-C3: Negative interspace. Right worse than left facet hypertrophy. No spinal stenosis. Mild right C3 foraminal narrowing. Left neural foramina remains patent. C3-C4: Broad-based posterior disc osteophyte flattens and effaces the ventral thecal sac. Mild  flattening of the ventral cord without cord signal changes. Mild spinal stenosis. Superimposed bilateral facet hypertrophy with left greater than right uncovertebral spurring. Severe left with moderate right C4 foraminal stenosis. C4-C5: C4 and C5 vertebral bodies are largely ankylosed. Broad posterior endplate osseous ridging flattens and effaces the ventral thecal sac. Mild spinal stenosis without cord impingement. Moderate right worse than left C5 foraminal stenosis. C5-C6: Degenerative intervertebral disc space narrowing with diffuse disc osteophyte complex. Mild flattening of the ventral thecal sac without significant spinal stenosis. Moderate left worse than right C6 foraminal narrowing. C6-C7: Degenerative intervertebral disc space narrowing with diffuse disc osteophyte complex. No significant spinal stenosis. Severe left with moderate right C7 foraminal stenosis. C7-T1: Trace anterolisthesis. No significant disc bulge. Moderate bilateral facet and ligament flavum hypertrophy. No significant spinal stenosis. Foramina remain patent. IMPRESSION: 1. No evidence for osteomyelitis discitis or septic arthritis within the cervical spine. No epidural abscess or other infection. 2. Multilevel cervical spondylosis with resultant mild spinal stenosis at C3-4 and C4-5. 3. Multifactorial degenerative changes with resultant multilevel foraminal narrowing as above. Notable findings include severe left with moderate right C4 foraminal stenosis, moderate bilateral C5 and C6 foraminal narrowing, with severe left and moderate right C7 foraminal stenosis. Electronically Signed   By: Jeannine Boga M.D.   On: 05/17/2020 21:40   MR THORACIC SPINE W WO CONTRAST  Result Date: 05/17/2020 CLINICAL DATA:  Initial screening for possible infection, epidural abscess. EXAM: MRI THORACIC WITHOUT AND WITH CONTRAST TECHNIQUE: Multiplanar and multiecho pulse sequences of the thoracic spine were obtained without and with intravenous  contrast. CONTRAST:  29mL GADAVIST GADOBUTROL 1 MMOL/ML IV SOLN COMPARISON:  None available. FINDINGS: Alignment: Straightening of the normal thoracic kyphosis. No listhesis. Vertebrae: Vertebral body height maintained without acute or chronic fracture. Bone marrow signal intensity within normal limits. Few scattered benign hemangiomata noted. No worrisome osseous lesions. No abnormal marrow edema or enhancement. No findings to suggest osteomyelitis discitis or septic arthritis. Cord: Normal signal and morphology. No epidural abscess or other collection. No abnormal enhancement. Paraspinal and other soft tissues: Paraspinous soft tissues demonstrate no acute finding. Small layering bilateral pleural effusions noted, left slightly larger than right. T2 hyperintense benign appearing cyst partially visualized extending from the right kidney. Visualized visceral structures otherwise grossly unremarkable. Disc levels: T1-2:  Unremarkable. T2-3: Tiny right paracentral disc extrusion with superior migration. Mild right-sided facet hypertrophy. No spinal stenosis. Foramina remain patent. T3-4: Minimal disc bulge. No spinal stenosis. Foramina remain patent. T4-5: Normal interspace. Mild right-sided facet hypertrophy. No stenosis. T5-6:  Normal interspace.  Mild facet hypertrophy.  No stenosis. T6-7:  Unremarkable. T7-8: Mild degenerative intervertebral disc space narrowing with disc bulge. No spinal stenosis. Foramina remain patent. T8-9: Mild disc bulge. No spinal stenosis. Foramina remain patent. T9-10: Mild disc  bulge with posterior element hypertrophy. No significant spinal stenosis. Mild bilateral foraminal narrowing. T10-11: Diffuse disc bulge, asymmetric to the right. Moderate facet and ligament flavum hypertrophy. Resultant mild to moderate spinal stenosis with mild cord flattening. No cord signal changes. Moderate bilateral foraminal narrowing. T11-12: Negative interspace. Bilateral facet hypertrophy. No significant  spinal stenosis. Mild bilateral foraminal narrowing. T12-L1: Disc desiccation with small endplate Schmorl's node deformities. No stenosis. IMPRESSION: 1. No MRI evidence for osteomyelitis discitis or septic arthritis within the thoracic spine. No epidural abscess or other infection. 2. Disc bulging with facet hypertrophy at T10-11 with resultant mild to moderate spinal stenosis and moderate bilateral foraminal narrowing. 3. Additional mild spondylosis elsewhere within the thoracic spine without significant stenosis. 4. Small layering bilateral pleural effusions, left slightly larger than right. Electronically Signed   By: Jeannine Boga M.D.   On: 05/17/2020 21:52     PERTINENT LAB RESULTS: CBC: Recent Labs    05/17/20 0201 05/18/20 0202  WBC 4.8 8.2  HGB 9.5* 8.6*  HCT 31.0* 27.4*  PLT 223 218   CMET CMP     Component Value Date/Time   NA 138 05/18/2020 0202   K 3.7 05/18/2020 0202   CL 107 05/18/2020 0202   CO2 24 05/18/2020 0202   GLUCOSE 242 (H) 05/18/2020 0202   BUN 22 05/18/2020 0202   CREATININE 0.86 05/18/2020 0202   CREATININE 1.64 (H) 02/18/2018 1622   CALCIUM 9.1 05/18/2020 0202   PROT 4.6 (L) 05/17/2020 0201   ALBUMIN 2.2 (L) 05/17/2020 0201   AST 26 05/17/2020 0201   ALT 13 05/17/2020 0201   ALKPHOS 64 05/17/2020 0201   BILITOT 0.9 05/17/2020 0201   GFRNONAA >60 05/18/2020 0202   GFRNONAA 41 (L) 02/18/2018 1622   GFRAA 48 (L) 02/18/2018 1622    GFR Estimated Creatinine Clearance: 87.6 mL/min (by C-G formula based on SCr of 0.86 mg/dL). No results for input(s): LIPASE, AMYLASE in the last 72 hours. No results for input(s): CKTOTAL, CKMB, CKMBINDEX, TROPONINI in the last 72 hours. Invalid input(s): POCBNP No results for input(s): DDIMER in the last 72 hours. No results for input(s): HGBA1C in the last 72 hours. No results for input(s): CHOL, HDL, LDLCALC, TRIG, CHOLHDL, LDLDIRECT in the last 72 hours. No results for input(s): TSH, T4TOTAL, T3FREE,  THYROIDAB in the last 72 hours.  Invalid input(s): FREET3 No results for input(s): VITAMINB12, FOLATE, FERRITIN, TIBC, IRON, RETICCTPCT in the last 72 hours. Coags: No results for input(s): INR in the last 72 hours.  Invalid input(s): PT Microbiology: Recent Results (from the past 240 hour(s))  Urine culture     Status: None   Collection Time: 05/09/20  2:07 AM   Specimen: In/Out Cath Urine  Result Value Ref Range Status   Specimen Description IN/OUT CATH URINE  Final   Special Requests NONE  Final   Culture   Final    NO GROWTH Performed at [facility], [address] [place], [place] [address]    Report Status 05/11/2020 FINAL  Final  Resp Panel by RT-PCR (Flu A&B, Covid) Nasopharyngeal Swab     Status: Abnormal   Collection Time: 05/09/20 12:12 PM   Specimen: Nasopharyngeal Swab; Nasopharyngeal(NP) swabs in vial transport medium  Result Value Ref Range Status   SARS Coronavirus 2 by RT PCR POSITIVE (A) NEGATIVE Final    Comment: RESULT CALLED TO, READ BACK BY AND VERIFIED WITH: RN P [facility] [id number] AT 1327 BY CM (NOTE) SARS-CoV-2 target nucleic acids are  DETECTED.  The SARS-CoV-2 RNA is generally detectable in upper respiratory specimens during the acute phase of infection. Positive results are indicative of the presence of the identified virus, but do not rule out bacterial infection or co-infection with other pathogens not detected by the test. Clinical correlation with patient history and other diagnostic information is necessary to determine patient infection status. The expected result is Negative.  Fact Sheet for Patients: EntrepreneurPulse.com.au  Fact Sheet for Healthcare Providers: IncredibleEmployment.be  This test is not yet approved or cleared by the [place] FDA and  has been authorized for detection and/or diagnosis of SARS-CoV-2 by FDA under an Emergency Use Authorization (EUA).  This EUA will remain in  effect (meaning this test can be u sed) for the duration of  the COVID-19 declaration under Section 564(b)(1) of the Act, 21 U.S.C. section 360bbb-3(b)(1), unless the authorization is terminated or revoked sooner.     Influenza A by PCR NEGATIVE NEGATIVE Final   Influenza B by PCR NEGATIVE NEGATIVE Final    Comment: (NOTE) The Xpert Xpress SARS-CoV-2/FLU/RSV plus assay is intended as an aid in the diagnosis of influenza from Nasopharyngeal swab specimens and should not be used as a sole basis for treatment. Nasal washings and aspirates are unacceptable for Xpert Xpress SARS-CoV-2/FLU/RSV testing.  Fact Sheet for Patients: EntrepreneurPulse.com.au  Fact Sheet for Healthcare Providers: IncredibleEmployment.be  This test is not yet approved or cleared by the [place] FDA and has been authorized for detection and/or diagnosis of SARS-CoV-2 by FDA under an Emergency Use Authorization (EUA). This EUA will remain in effect (meaning this test can be used) for the duration of the COVID-19 declaration under Section 564(b)(1) of the Act, 21 U.S.C. section 360bbb-3(b)(1), unless the authorization is terminated or revoked.  Performed at [facility], [place] [address], [place], [place] [address]   Blood Culture (routine x 2)     Status: None   Collection Time: 05/09/20 12:12 PM   Specimen: BLOOD  Result Value Ref Range Status   Specimen Description BLOOD SITE NOT SPECIFIED  Final   Special Requests   Final    BOTTLES DRAWN AEROBIC AND ANAEROBIC Blood Culture adequate volume   Culture   Final    NO GROWTH 5 DAYS Performed at [facility], [address] [place], [place] [address]    Report Status 05/14/2020 FINAL  Final  Blood Culture (routine x 2)     Status: None   Collection Time: 05/09/20 12:12 PM   Specimen: BLOOD  Result Value Ref Range Status   Specimen Description BLOOD BLOOD LEFT HAND  Final   Special Requests   Final     BOTTLES DRAWN AEROBIC AND ANAEROBIC Blood Culture adequate volume   Culture   Final    NO GROWTH 5 DAYS Performed at [facility], [place] [address], [place], [place] [address]    Report Status 05/14/2020 FINAL  Final  MRSA PCR Screening     Status: Abnormal   Collection Time: 05/09/20 12:30 PM   Specimen: Nasal Mucosa; Nasopharyngeal  Result Value Ref Range Status   MRSA by PCR POSITIVE (A) NEGATIVE Final    Comment:        The GeneXpert MRSA Assay (FDA approved for NASAL specimens only), is one component of a comprehensive MRSA colonization surveillance program. It is not intended to diagnose MRSA infection nor to guide or monitor treatment for MRSA infections. RESULT CALLED TO, READ BACK BY AND VERIFIED WITH: IDOL,K  RN AT 2212 05/09/2020 MITCHELL,L Performed at [facility] [address] [place], [place] [address]   Culture, blood (routine x 2)     Status: None   Collection Time: 05/13/20 11:07 AM   Specimen: BLOOD RIGHT HAND  Result Value Ref Range Status   Specimen Description BLOOD RIGHT HAND  Final   Special Requests   Final    BOTTLES DRAWN AEROBIC AND ANAEROBIC Blood Culture adequate volume   Culture   Final    NO GROWTH 5 DAYS Performed at [facility], [place] [address] [place], [place] [address]    Report Status 05/18/2020 FINAL  Final  Culture, blood (routine x 2)     Status: None   Collection Time: 05/13/20 11:13 AM   Specimen: BLOOD LEFT HAND  Result Value Ref Range Status   Specimen Description BLOOD LEFT HAND  Final   Special Requests   Final    BOTTLES DRAWN AEROBIC AND ANAEROBIC Blood Culture adequate volume   Culture   Final    NO GROWTH 5 DAYS Performed at [facility], [place] [address] [place], [place] [address]    Report Status 05/18/2020 FINAL  Final    FURTHER DISCHARGE INSTRUCTIONS:  Get Medicines reviewed and adjusted: Please take all your medications with you for your next visit with your Primary  MD  Laboratory/radiological data: Please request your Primary MD to go over all hospital tests and procedure/radiological results at the follow up, please ask your Primary MD to get all Hospital records sent to his/her office.  In some cases, they will be blood work, cultures and biopsy results pending at the time of your discharge. Please request that your primary care M.D. goes through all the records of your hospital data and follows up on these results.  Also Note the following: If you experience worsening of your admission symptoms, develop shortness of breath, life threatening emergency, suicidal or homicidal thoughts you must seek medical attention immediately by calling 911 or calling your MD immediately  if symptoms less severe.  You must read complete instructions/literature along with all the possible adverse reactions/side effects for all the Medicines you take and that have been prescribed to you. Take any new Medicines after you have completely understood and accpet all the possible adverse reactions/side effects.   Do not drive when taking Pain medications or sleeping medications (Benzodaizepines)  Do not take more than prescribed Pain, Sleep and Anxiety Medications. It is not advisable to combine anxiety,sleep and pain medications without talking with your primary care practitioner  Special Instructions: If you have smoked or chewed Tobacco  in the last 2 yrs please stop smoking, stop any regular Alcohol  and or any Recreational drug use.  Wear Seat belts while driving.  Please note: You were cared for by a hospitalist during your hospital stay. Once you are discharged, your primary care physician will handle any further medical issues. Please note that NO REFILLS for any discharge medications will be authorized once you are discharged, as it is imperative that you return to your primary care physician (or establish a relationship with a primary care physician if you do not have  one) for your post hospital discharge needs so that they can reassess your need for medications and monitor your lab values.  Total Time spent coordinating discharge including counseling, education and face to face time equals 35 minutes  Signed: Oren Binet 05/18/2020 10:58 AM

## 2020-05-18 NOTE — Progress Notes (Signed)
Patient was discharged from [phone]. DME (oxygen tank & tubing, oxygen concentrator, wheelchair) sent with patient. TOC meds and prescriptions sent with patient. AVS reviewed and all questions answered. IVs d/c'd. Patient will be taken to car with daughter via nurse tech.

## 2020-05-18 NOTE — Progress Notes (Signed)
Inpatient Diabetes Program Recommendations  AACE/ADA: New Consensus Statement on Inpatient Glycemic Control (2015)  Target Ranges:  Prepandial:   less than 140 mg/dL      Peak postprandial:   less than 180 mg/dL (1-2 hours)      Critically ill patients:  140 - 180 mg/dL   Lab Results  Component Value Date   GLUCAP 180 (H) 05/18/2020   HGBA1C 6.3 (H) 05/12/2020    Review of Glycemic Control Results for Tyrone Roman, Tyrone Roman (MRN [id number]) as of 05/18/2020 11:16  Ref. Range 05/17/2020 07:33 05/17/2020 11:49 05/17/2020 17:11 05/17/2020 20:39 05/18/2020 07:47  Glucose-Capillary Latest Ref Range: 70 - 99 mg/dL 173 (H) 200 (H) 276 (H) 253 (H) 180 (H)    Inpatient Diabetes Program Recommendations:     Post prandial glucose elevated.  Please consider Novolog 2 units TID with meals if eats at least 50%.  Will continue to follow while inpatient.  Thank you, Reche Dixon, RN, BSN Diabetes Coordinator Inpatient Diabetes Program [phone] (team pager from 8a-5p)

## 2020-05-18 NOTE — Progress Notes (Signed)
ANTICOAGULATION CONSULT NOTE - Initial Consult  Pharmacy Consult for Heparin > Enoxaparin>>apixaban Indication: DVT; PE  Allergies  Allergen Reactions  . Cephalexin Shortness Of Breath, Rash and Other (See Comments)    Tolerated Augmentin 2015 and 2017 (12-2017). Tolerated nafcillin 12/2017 PATIENT HAS HAD A PCN REACTION WITH IMMEDIATE RASH, FACIAL/TONGUE/THROAT SWELLING, SOB, OR LIGHTHEADEDNESS WITH HYPOTENSION:  #  #  YES  #  #  Has patient had a PCN reaction causing severe rash involving mucus membranes or skin necrosis: No Has patient had a PCN reaction that required hospitalization: No Has patient had a PCN reaction occurring within the last 10 years: No If all of the above answers are "NO", then may proceed  . Clarithromycin Shortness Of Breath and Rash  . Certolizumab Pegol Hives  . Tocilizumab Hives  . Doxycycline Rash  . Hydroxyzine Rash  . Lidoderm Other (See Comments)    "made me act weird"  . Pyrithione Zinc Rash    Patient Measurements: Height: 6\' 2"  (188 cm) Weight: 88 kg (194 lb 0.1 oz) IBW/kg (Calculated) : 82.2   Vital Signs: Temp: 97.3 F (36.3 C) (04/13 0742) Temp Source: Oral (04/13 0742) BP: 139/94 (04/13 0742) Pulse Rate: 82 (04/13 0742)  Labs: Recent Labs    05/16/20 0057 05/17/20 0201 05/18/20 0202  HGB 9.5* 9.5* 8.6*  HCT 30.2* 31.0* 27.4*  PLT 233 223 218  HEPARINUNFRC  --  0.95*  --   CREATININE 0.97 0.91 0.86    Estimated Creatinine Clearance: 87.6 mL/min (by C-G formula based on SCr of 0.86 mg/dL).   Medical History: Past Medical History:  Diagnosis Date  . Allergy   . Barrett's esophageal ulceration   . BPH (benign prostatic hyperplasia)   . CAD (coronary artery disease)    sees Dr. Mar Daring  . Cataract    both eyes removed   . Colonic polyp   . Concussion with loss of consciousness of 30 minutes or less   . Contact dermatitis   . Contact with or exposure to venereal diseases   . COPD (chronic obstructive pulmonary  disease) ([place])    sees Dr. Kara Mead   . Diverticulosis of colon   . Dysphagia   . GERD (gastroesophageal reflux disease)    past hx- on meds   . HNP (herniated nucleus pulposus), lumbar    recurrent  . Hyperlipidemia   . Hypertension   . Myocardial infarction [facility])    2011- stent placed  . Nephrolithiasis   . Rheumatoid arthritis [facility])    sees Dr. Gavin Pound   . SOB (shortness of breath)     Assessment: 75 yo male admitted on 05/09/2020 with tachycardia. D-dimer elevated. Lower extremity doppler founds DVT involving the left femoral vein, left popliteal vein, left posterior tibial veins, and left peroneal vein. CTA found progressive PE. Patient has been receiving therapeutic enoxaparin since 4/7. Briefly on heparin and then back on enoxaparin. MD now wishes to start apixaban. D/W Dr. Sloan Leiter, will load with standard 10mg  BID x 7 days, then 5mg  BID .   Goal of Therapy:  Monitor platelets by anticoagulation protocol: Yes   Plan:  Stop enoxaparin  Apixaban 10mg  BID x 7 days, then 5mg  BID    Dietra Stokely A. Levada Dy, PharmD, BCPS, FNKF Clinical Pharmacist [facility] Please utilize Amion for appropriate phone number to reach the unit pharmacist ([place])    05/18/2020,10:57 AM

## 2020-05-18 NOTE — Progress Notes (Signed)
Subjective:  Patient wants to go home   Antibiotics:  Anti-infectives (From admission, onward)   Start     Dose/Rate Route Frequency Ordered Stop   05/15/20 2000  vancomycin (VANCOREADY) IVPB 1500 mg/300 mL  Status:  Discontinued        1,500 mg 150 mL/hr over 120 Minutes Intravenous Every 24 hours 05/14/20 0034 05/14/20 1109   05/14/20 1100  levofloxacin (LEVAQUIN) tablet 750 mg  Status:  Discontinued        750 mg Oral Daily 05/14/20 0902 05/14/20 1109   05/14/20 0130  vancomycin (VANCOREADY) IVPB 1500 mg/300 mL        1,500 mg 150 mL/hr over 120 Minutes Intravenous  Once 05/14/20 0034 05/14/20 0458   05/10/20 1700  vancomycin (VANCOREADY) IVPB 1500 mg/300 mL  Status:  Discontinued        1,500 mg 150 mL/hr over 120 Minutes Intravenous Every 24 hours 05/10/20 1613 05/12/20 1041   05/10/20 1700  piperacillin-tazobactam (ZOSYN) IVPB 3.375 g  Status:  Discontinued        3.375 g 12.5 mL/hr over 240 Minutes Intravenous Every 8 hours 05/10/20 1613 05/16/20 1144   05/10/20 1400  ceFEPIme (MAXIPIME) 2 g in sodium chloride 0.9 % 100 mL IVPB  Status:  Discontinued        2 g 200 mL/hr over 30 Minutes Intravenous Every 8 hours 05/10/20 1002 05/10/20 1108   05/10/20 1330  vancomycin (VANCOREADY) IVPB 1500 mg/300 mL  Status:  Discontinued        1,500 mg 150 mL/hr over 120 Minutes Intravenous Every 24 hours 05/09/20 1355 05/10/20 1108   05/10/20 1215  levofloxacin (LEVAQUIN) tablet 750 mg  Status:  Discontinued        750 mg Oral Daily 05/10/20 1115 05/10/20 2003   05/10/20 0100  ceFEPIme (MAXIPIME) 2 g in sodium chloride 0.9 % 100 mL IVPB  Status:  Discontinued        2 g 200 mL/hr over 30 Minutes Intravenous Every 12 hours 05/09/20 1355 05/10/20 1002   05/09/20 1230  ceFEPIme (MAXIPIME) 2 g in sodium chloride 0.9 % 100 mL IVPB        2 g 200 mL/hr over 30 Minutes Intravenous  Once 05/09/20 1227 05/09/20 1324   05/09/20 1230  vancomycin (VANCOREADY) IVPB 2000 mg/400 mL         2,000 mg 200 mL/hr over 120 Minutes Intravenous  Once 05/09/20 1228 05/09/20 1546      Medications: Scheduled Meds: . apixaban  10 mg Oral BID   Followed by  . [START ON 05/25/2020] apixaban  5 mg Oral BID  . aspirin  81 mg Oral Daily  . insulin aspart  0-15 Units Subcutaneous TID WC  . insulin aspart  0-5 Units Subcutaneous QHS  . lactose free nutrition  237 mL Oral TID WC  . melatonin  5 mg Oral QHS  . metoprolol tartrate  25 mg Oral BID  . mometasone-formoterol  2 puff Inhalation BID  . pantoprazole  40 mg Oral Daily  . predniSONE  30 mg Oral Q12H  . simvastatin  20 mg Oral Daily  . sodium chloride flush  3 mL Intravenous Q12H  . tamsulosin  0.4 mg Oral Daily  . umeclidinium bromide  1 puff Inhalation Daily   Continuous Infusions: . sodium chloride    . lactated ringers 10 mL/hr at 05/18/20 1043   PRN Meds:.sodium chloride, acetaminophen, acetaminophen **OR** [DISCONTINUED] acetaminophen, albuterol,  alum & mag hydroxide-simeth, HYDROcodone-acetaminophen, lip balm, metoprolol tartrate, [DISCONTINUED] ondansetron **OR** ondansetron (ZOFRAN) IV    Objective: Weight change:   Intake/Output Summary (Last 24 hours) at 05/18/2020 1427 Last data filed at 05/18/2020 0300 Gross per 24 hour  Intake 1611.94 ml  Output 300 ml  Net 1311.94 ml   Blood pressure 120/89, pulse 78, temperature 98.1 F (36.7 C), temperature source Oral, resp. rate 19, height 6\' 2"  (1.88 m), weight 88 kg, SpO2 99 %. Temp:  [97.3 F (36.3 C)-98.7 F (37.1 C)] 98.1 F (36.7 C) (04/13 1204) Pulse Rate:  [77-98] 78 (04/13 1204) Resp:  [19-20] 19 (04/13 1204) BP: (102-139)/(77-97) 120/89 (04/13 1204) SpO2:  [92 %-99 %] 99 % (04/13 1204)  Physical Exam: Physical Exam Constitutional:      Appearance: He is well-developed.  HENT:     Head: Normocephalic and atraumatic.  Eyes:     Conjunctiva/sclera: Conjunctivae normal.  Cardiovascular:     Rate and Rhythm: Normal rate and regular rhythm.      Heart sounds: No murmur heard. No friction rub. No gallop.   Pulmonary:     Effort: Pulmonary effort is normal. No respiratory distress.     Breath sounds: Normal breath sounds. No stridor. No wheezing or rhonchi.  Abdominal:     General: Abdomen is flat. There is no distension.     Palpations: Abdomen is soft. There is no mass.  Musculoskeletal:        General: Normal range of motion.     Cervical back: Normal range of motion and neck supple.  Skin:    General: Skin is warm and dry.     Findings: No erythema or rash.  Neurological:     General: No focal deficit present.     Mental Status: He is alert and oriented to person, place, and time.  Psychiatric:        Mood and Affect: Mood normal.        Behavior: Behavior normal.        Thought Content: Thought content normal.        Judgment: Judgment normal.      CBC:    BMET Recent Labs    05/17/20 0201 05/18/20 0202  NA 136 138  K 3.9 3.7  CL 105 107  CO2 24 24  GLUCOSE 224* 242*  BUN 15 22  CREATININE 0.91 0.86  CALCIUM 9.2 9.1     Liver Panel  Recent Labs    05/17/20 0201  PROT 4.6*  ALBUMIN 2.2*  AST 26  ALT 13  ALKPHOS 64  BILITOT 0.9       Sedimentation Rate Recent Labs    05/17/20 0201  ESRSEDRATE 63*   C-Reactive Protein Recent Labs    05/17/20 0201 05/18/20 0202  CRP 9.0* 5.8*    Micro Results: Recent Results (from the past 720 hour(s))  Urine culture     Status: None   Collection Time: 05/09/20  2:07 AM   Specimen: In/Out Cath Urine  Result Value Ref Range Status   Specimen Description IN/OUT CATH URINE  Final   Special Requests NONE  Final   Culture   Final    NO GROWTH Performed at [facility], [address], [place], [place] [address]    Report Status 05/11/2020 FINAL  Final  Resp Panel by RT-PCR (Flu A&B, Covid) Nasopharyngeal Swab     Status: Abnormal   Collection Time: 05/09/20 12:12 PM   Specimen: Nasopharyngeal Swab; Nasopharyngeal(NP)  swabs in vial  transport medium  Result Value Ref Range Status   SARS Coronavirus 2 by RT PCR POSITIVE (A) NEGATIVE Final    Comment: RESULT CALLED TO, READ BACK BY AND VERIFIED WITH: RN P [facility] [id number] AT 0175 BY CM (NOTE) SARS-CoV-2 target nucleic acids are DETECTED.  The SARS-CoV-2 RNA is generally detectable in upper respiratory specimens during the acute phase of infection. Positive results are indicative of the presence of the identified virus, but do not rule out bacterial infection or co-infection with other pathogens not detected by the test. Clinical correlation with patient history and other diagnostic information is necessary to determine patient infection status. The expected result is Negative.  Fact Sheet for Patients: EntrepreneurPulse.com.au  Fact Sheet for Healthcare Providers: IncredibleEmployment.be  This test is not yet approved or cleared by the [place] FDA and  has been authorized for detection and/or diagnosis of SARS-CoV-2 by FDA under an Emergency Use Authorization (EUA).  This EUA will remain in effect (meaning this test can be u sed) for the duration of  the COVID-19 declaration under Section 564(b)(1) of the Act, 21 U.S.C. section 360bbb-3(b)(1), unless the authorization is terminated or revoked sooner.     Influenza A by PCR NEGATIVE NEGATIVE Final   Influenza B by PCR NEGATIVE NEGATIVE Final    Comment: (NOTE) The Xpert Xpress SARS-CoV-2/FLU/RSV plus assay is intended as an aid in the diagnosis of influenza from Nasopharyngeal swab specimens and should not be used as a sole basis for treatment. Nasal washings and aspirates are unacceptable for Xpert Xpress SARS-CoV-2/FLU/RSV testing.  Fact Sheet for Patients: EntrepreneurPulse.com.au  Fact Sheet for Healthcare Providers: IncredibleEmployment.be  This test is not yet approved or cleared by the [place] FDA and has been  authorized for detection and/or diagnosis of SARS-CoV-2 by FDA under an Emergency Use Authorization (EUA). This EUA will remain in effect (meaning this test can be used) for the duration of the COVID-19 declaration under Section 564(b)(1) of the Act, 21 U.S.C. section 360bbb-3(b)(1), unless the authorization is terminated or revoked.  Performed at [facility], [place] [address] [place], [place] [address]   Blood Culture (routine x 2)     Status: None   Collection Time: 05/09/20 12:12 PM   Specimen: BLOOD  Result Value Ref Range Status   Specimen Description BLOOD SITE NOT SPECIFIED  Final   Special Requests   Final    BOTTLES DRAWN AEROBIC AND ANAEROBIC Blood Culture adequate volume   Culture   Final    NO GROWTH 5 DAYS Performed at [facility], [address] [place], [place] [address]    Report Status 05/14/2020 FINAL  Final  Blood Culture (routine x 2)     Status: None   Collection Time: 05/09/20 12:12 PM   Specimen: BLOOD  Result Value Ref Range Status   Specimen Description BLOOD BLOOD LEFT HAND  Final   Special Requests   Final    BOTTLES DRAWN AEROBIC AND ANAEROBIC Blood Culture adequate volume   Culture   Final    NO GROWTH 5 DAYS Performed at [facility], [place] [address], [place], [place] [address]    Report Status 05/14/2020 FINAL  Final  MRSA PCR Screening     Status: Abnormal   Collection Time: 05/09/20 12:30 PM   Specimen: Nasal Mucosa; Nasopharyngeal  Result Value Ref Range Status   MRSA by PCR POSITIVE (A) NEGATIVE Final    Comment:  The GeneXpert MRSA Assay (FDA approved for NASAL specimens only), is one component of a comprehensive MRSA colonization surveillance program. It is not intended to diagnose MRSA infection nor to guide or monitor treatment for MRSA infections. RESULT CALLED TO, READ BACK BY AND VERIFIED WITH: IDOL,K RN AT 2212 05/09/2020 MITCHELL,L Performed at [facility], [place] [address] [place], [place]  [address]   Culture, blood (routine x 2)     Status: None   Collection Time: 05/13/20 11:07 AM   Specimen: BLOOD RIGHT HAND  Result Value Ref Range Status   Specimen Description BLOOD RIGHT HAND  Final   Special Requests   Final    BOTTLES DRAWN AEROBIC AND ANAEROBIC Blood Culture adequate volume   Culture   Final    NO GROWTH 5 DAYS Performed at [facility], [place] [address] [place], [place] [address]    Report Status 05/18/2020 FINAL  Final  Culture, blood (routine x 2)     Status: None   Collection Time: 05/13/20 11:13 AM   Specimen: BLOOD LEFT HAND  Result Value Ref Range Status   Specimen Description BLOOD LEFT HAND  Final   Special Requests   Final    BOTTLES DRAWN AEROBIC AND ANAEROBIC Blood Culture adequate volume   Culture   Final    NO GROWTH 5 DAYS Performed at [facility], [place] [address] [place],  [address]    Report Status 05/18/2020 FINAL  Final  Blastomyces Antigen     Status: None   Collection Time: 05/14/20  4:28 PM   Specimen: Blood  Result Value Ref Range Status   Blastomyces Antigen None Detected None Detected ng/mL Final    Comment: (NOTE) Results reported as ng/mL in 0.2 - 14.7 ng/mL range Results above the limit of detection but below 0.2 ng/mL are reported as 'Positive, Below the Limit of Quantification' Results above 14.7 ng/mL are reported as 'Positive, Above the Limit of Quantification'    Specimen Type SERUM  Final    Comment: (NOTE) Performed At: [place], [place] [id number] Bruce Donath MD WP:[id number]     Studies/Results: MR BRAIN W WO CONTRAST  Result Date: 05/16/2020 CLINICAL DATA:  Delirium; fever with unknown origin, altered mental status, evaluate for encephalitis. EXAM: MRI HEAD WITHOUT AND WITH CONTRAST TECHNIQUE: Multiplanar, multiecho pulse sequences of the brain and surrounding structures were obtained without and with intravenous contrast. CONTRAST:  5mL GADAVIST  GADOBUTROL 1 MMOL/ML IV SOLN COMPARISON:  Prior head CT examinations 04/02/2020 and earlier. Brain MRI 12/13/2017. FINDINGS: Brain: Mild cerebral and cerebellar atrophy. AP punctate focus of restricted diffusion is questioned within the right parietal lobe (versus artifact) (series 3, image 33). Additionally, there is apparent subtle restricted diffusion along the midline callosal splenium, slightly eccentric to the left. A few small foci of T2/FLAIR hyperintensity within the bilateral cerebral white matter and right thalamus are nonspecific, but likely reflect minimal chronic small vessel ischemic disease. Redemonstrated left retrocerebellar arachnoid cyst. There is no acute infarct. No evidence of intracranial mass. No chronic intracranial blood products. No extra-axial fluid collection. No midline shift. No abnormal intracranial enhancement. Vascular: Expected proximal arterial flow voids. Skull and upper cervical spine: No focal marrow lesion. Incompletely assessed cervical spondylosis. Suspected C4-C5 vertebral body ankylosis. Sinuses/Orbits: Visualized orbits show no acute finding. Bilateral lens replacements. Mild paranasal sinus mucosal thickening, most notably within the bilateral ethmoid air cells. Small left maxillary sinus mucous retention cyst. Other:  Right mastoid effusion. IMPRESSION: Punctate acute infarct versus artifact within the right parietal lobe. Additionally, there is apparent subtle restricted diffusion within the midline callosal splenium, slightly eccentric to the left. This may reflect a small focus of acute ischemia or artifact. Alternatively, this may reflect a cytotoxic lesion of the corpus callosum (which has a broad range of potential etiologies and clinical associations), although the appearance would be atypical for this entity. Background mild parenchymal atrophy and chronic small vessel ischemic disease. Mild paranasal sinus disease as described. Right mastoid effusion.  Electronically Signed   By: Kellie Simmering DO   On: 05/16/2020 18:51   MR CERVICAL SPINE W WO CONTRAST  Result Date: 05/17/2020 CLINICAL DATA:  Initial screening for possible epidural abscess. EXAM: MRI CERVICAL SPINE WITHOUT AND WITH CONTRAST TECHNIQUE: Multiplanar and multiecho pulse sequences of the cervical spine, to include the craniocervical junction and cervicothoracic junction, were obtained without and with intravenous contrast. CONTRAST:  67mL GADAVIST GADOBUTROL 1 MMOL/ML IV SOLN COMPARISON:  Prior MRI from 03/24/2010. FINDINGS: Alignment: Reversal of the normal cervical lordosis with apex at C3-4. Trace anterolisthesis of C3 on C4 and C7 on T1, chronic and facet mediated. Vertebrae: Vertebral body height maintained without acute or chronic fracture. C4 and C5 vertebral bodies are largely ankylosed, likely degenerative. Bone marrow signal intensity within normal limits. No discrete or worrisome osseous lesions. No abnormal marrow edema or enhancement. No findings to suggest osteomyelitis discitis or septic arthritis. Cord: Normal signal and morphology. No epidural abscess or other collection. Posterior Fossa, vertebral arteries, paraspinal tissues: Probable benign retro cerebellar arachnoid cyst noted. Visualized brain and posterior fossa otherwise unremarkable. Craniocervical junction within normal limits. Paraspinous and prevertebral soft tissues normal. Normal flow voids seen within the vertebral arteries bilaterally. Disc levels: C2-C3: Negative interspace. Right worse than left facet hypertrophy. No spinal stenosis. Mild right C3 foraminal narrowing. Left neural foramina remains patent. C3-C4: Broad-based posterior disc osteophyte flattens and effaces the ventral thecal sac. Mild flattening of the ventral cord without cord signal changes. Mild spinal stenosis. Superimposed bilateral facet hypertrophy with left greater than right uncovertebral spurring. Severe left with moderate right C4 foraminal  stenosis. C4-C5: C4 and C5 vertebral bodies are largely ankylosed. Broad posterior endplate osseous ridging flattens and effaces the ventral thecal sac. Mild spinal stenosis without cord impingement. Moderate right worse than left C5 foraminal stenosis. C5-C6: Degenerative intervertebral disc space narrowing with diffuse disc osteophyte complex. Mild flattening of the ventral thecal sac without significant spinal stenosis. Moderate left worse than right C6 foraminal narrowing. C6-C7: Degenerative intervertebral disc space narrowing with diffuse disc osteophyte complex. No significant spinal stenosis. Severe left with moderate right C7 foraminal stenosis. C7-T1: Trace anterolisthesis. No significant disc bulge. Moderate bilateral facet and ligament flavum hypertrophy. No significant spinal stenosis. Foramina remain patent. IMPRESSION: 1. No evidence for osteomyelitis discitis or septic arthritis within the cervical spine. No epidural abscess or other infection. 2. Multilevel cervical spondylosis with resultant mild spinal stenosis at C3-4 and C4-5. 3. Multifactorial degenerative changes with resultant multilevel foraminal narrowing as above. Notable findings include severe left with moderate right C4 foraminal stenosis, moderate bilateral C5 and C6 foraminal narrowing, with severe left and moderate right C7 foraminal stenosis. Electronically Signed   By: Jeannine Boga M.D.   On: 05/17/2020 21:40   MR THORACIC SPINE W WO CONTRAST  Result Date: 05/17/2020 CLINICAL DATA:  Initial screening for possible infection, epidural abscess. EXAM: MRI THORACIC WITHOUT AND WITH CONTRAST TECHNIQUE: Multiplanar and multiecho pulse sequences of  the thoracic spine were obtained without and with intravenous contrast. CONTRAST:  22mL GADAVIST GADOBUTROL 1 MMOL/ML IV SOLN COMPARISON:  None available. FINDINGS: Alignment: Straightening of the normal thoracic kyphosis. No listhesis. Vertebrae: Vertebral body height maintained  without acute or chronic fracture. Bone marrow signal intensity within normal limits. Few scattered benign hemangiomata noted. No worrisome osseous lesions. No abnormal marrow edema or enhancement. No findings to suggest osteomyelitis discitis or septic arthritis. Cord: Normal signal and morphology. No epidural abscess or other collection. No abnormal enhancement. Paraspinal and other soft tissues: Paraspinous soft tissues demonstrate no acute finding. Small layering bilateral pleural effusions noted, left slightly larger than right. T2 hyperintense benign appearing cyst partially visualized extending from the right kidney. Visualized visceral structures otherwise grossly unremarkable. Disc levels: T1-2:  Unremarkable. T2-3: Tiny right paracentral disc extrusion with superior migration. Mild right-sided facet hypertrophy. No spinal stenosis. Foramina remain patent. T3-4: Minimal disc bulge. No spinal stenosis. Foramina remain patent. T4-5: Normal interspace. Mild right-sided facet hypertrophy. No stenosis. T5-6:  Normal interspace.  Mild facet hypertrophy.  No stenosis. T6-7:  Unremarkable. T7-8: Mild degenerative intervertebral disc space narrowing with disc bulge. No spinal stenosis. Foramina remain patent. T8-9: Mild disc bulge. No spinal stenosis. Foramina remain patent. T9-10: Mild disc bulge with posterior element hypertrophy. No significant spinal stenosis. Mild bilateral foraminal narrowing. T10-11: Diffuse disc bulge, asymmetric to the right. Moderate facet and ligament flavum hypertrophy. Resultant mild to moderate spinal stenosis with mild cord flattening. No cord signal changes. Moderate bilateral foraminal narrowing. T11-12: Negative interspace. Bilateral facet hypertrophy. No significant spinal stenosis. Mild bilateral foraminal narrowing. T12-L1: Disc desiccation with small endplate Schmorl's node deformities. No stenosis. IMPRESSION: 1. No MRI evidence for osteomyelitis discitis or septic arthritis  within the thoracic spine. No epidural abscess or other infection. 2. Disc bulging with facet hypertrophy at T10-11 with resultant mild to moderate spinal stenosis and moderate bilateral foraminal narrowing. 3. Additional mild spondylosis elsewhere within the thoracic spine without significant stenosis. 4. Small layering bilateral pleural effusions, left slightly larger than right. Electronically Signed   By: Jeannine Boga M.D.   On: 05/17/2020 21:52      Assessment/Plan:  INTERVAL HISTORY:   MRI C and T-spine were doubt evidence of osteomyelitis or discitis  Principal Problem:   Sepsis due to pneumonia [facility]) Active Problems:   Hyperlipidemia   BPH (benign prostatic hyperplasia)   Sepsis ([place])   Stage 3b chronic kidney disease (HCC)   Post-COVID-19 syndrome manifesting as chronic cough   COPD without exacerbation (HCC)   FUO (fever of unknown origin)   Weakness of both lower extremities    Tyrone Roman is a 75 y.o. male with past medical significant for rheumatoid arthritis coronary disease, COPD BPH prior lumbar surgery with herniated nucleus pulposis diverticulosis COVID-19 infection in January with multiple hospitalizations.  He was admitted this time for acute hypoxic respiratory failure to be due to acute on chronic congestive heart failure.  He has now been found to have a deep venous thrombosis and pulmonary embolism.  He has had fevers despite broad-spectrum antibiotics in the form of vancomycin and Zosyn.  I dc vancomycin without a clear target for this antibiotic  He had CT abdomen and pelvis which did not show significant intraabdominal pathology but progression of his PE.   I dc'd zosyn on Sunday  He received high dose hydrocortisone Sunday and Monday morning appeared more lucid than I have ever seen him.  Monday afternoon he again developed high-grade fevers  and was again encephalopathic  We considered whether this could be due to persistent COVID  infection, replication but recent COVID PCR was negative. He does not have antibodies despite 3 vaccines and infection and until a few days ago persistently positive COVID PCR  I am wondering if he could have a vasculitis that temporarily responded to high dose steroids and then worsened again with being dropped back to oral prednisone 20 mg.  Obtained an MRI of the brain which showed a possible infarct though  He went back on high-dose steroids and again this morning had return to normal and could even give me much of the current events  MRI of the cervical and thoracic spine was obtained which was negative for infection  Patient has been transitioned to 30 mg of prednisone twice daily and has not had fevers or encephalopathy.  He wants to go home and I think this is reasonable long as he is closely followed.  He is being discharged today we will sign off for now please call with further questions  .I spent greater than 41minutes with the patient including greater than 50% of time in face to face counsel of the patient and in coordination of his care.    LOS: 9 days   Tyrone Roman 05/18/2020, 2:27 PM

## 2020-05-18 NOTE — Progress Notes (Signed)
Occupational Therapy Treatment Patient Details Name: Tyrone Roman MRN: [id number] DOB: 01-Nov-1945 Today's Date: 05/18/2020    History of present illness Pt is 75 y.o. male presenting with tachycardia and confusion and admitted on 05/09/20 with sepsis and likely PNE.  Presents from [facility] after hospitalization in February for COVID PNA.  PMH: RA; CAD s/p stent; HTN; HLD; COPD; and BPH   OT comments  Pt progressing gradually towards OT goals, benefits from encouragement to participate but able to demonstrate decreasing physical assist for transfers using RW at [place]. Pt denies considerable dizziness with movement, but noted to very fatigued after completing one transfer. Noted pt with plan to DC home, rather than SNF for short term rehab. Discussed DME needs, home setup and assist at home with daughter (present at start of session) planning to stay with pt. Educated on energy conservation strategies, encouraged use of BSC while gradually increasing endurance to be able to walk to bathroom at home. Provided education on UE HEP using theraband (with handout provided) with Min A needed for successful completion.    Follow Up Recommendations  SNF;Supervision/Assistance - 24 hour (noted declining SNF for rehab; will need HHOT, max HH services, and 24/7 assist at home.)    [facility] bed;3 in 1 bedside commode    Recommendations for Other Services      Precautions / Restrictions Precautions Precautions: Fall;Other (comment) Precaution Comments: watch BP Restrictions Weight Bearing Restrictions: No       Mobility Bed Mobility Overal bed mobility: Needs Assistance Bed Mobility: Supine to Sit     Supine to sit: Supervision;HOB elevated     General bed mobility comments: increased time, use of bedrail    Transfers Overall transfer level: Needs assistance Equipment used: Rolling walker (2 wheeled) Transfers: Sit to/from [facility] Sit to  Stand: Min assist Stand pivot transfers: Min guard       General transfer comment: Min A for sit to stand at bedside with slightly elevated bed, min guard for taking steps to recliner. fair carryover for hand placement    Balance Overall balance assessment: Needs assistance Sitting-balance support: No upper extremity supported;Feet supported Sitting balance-Leahy Scale: Fair     Standing balance support: Bilateral upper extremity supported Standing balance-Leahy Scale: Poor Standing balance comment: reliant on UE support in standing                           ADL either performed or assessed with clinical judgement   ADL Overall ADL's : Needs assistance/impaired                                       General ADL Comments: Collaborated with pt/daughter on DME at home. Educated on energy conservation strategies with handout provided. Encouraged use of BSC while working on endurance for mobility to/from bathroom with assistance at home.     Vision   Vision Assessment?: No apparent visual deficits   Perception     Praxis      Cognition Arousal/Alertness: Awake/alert Behavior During Therapy: WFL for tasks assessed/performed Overall Cognitive Status: Impaired/Different from baseline Area of Impairment: Attention;Memory;Safety/judgement;Awareness;Problem solving                   Current Attention Level: Selective Memory: Decreased short-term memory   Safety/Judgement: Decreased awareness of safety;Decreased awareness of deficits Awareness: Emergent  Problem Solving: Slow processing;Decreased initiation;Difficulty sequencing;Requires verbal cues General Comments: decreased recall of DME at home, daughter in room had to correct pt. Cues for attending to tasks, education needed for safety/awareness of deficits at home though pt improving in awareness of weakness.        Exercises Exercises: General Upper Extremity General Exercises - Upper  Extremity Shoulder Flexion: Strengthening;Both;5 reps;Seated;Theraband Theraband Level (Shoulder Flexion): Level 1 (Yellow) Shoulder Horizontal ABduction: Strengthening;Both;5 reps;Seated;Theraband Theraband Level (Shoulder Horizontal Abduction): Level 1 (Yellow) Elbow Flexion: Strengthening;Both;5 reps;Seated;Theraband Theraband Level (Elbow Flexion): Level 1 (Yellow) Elbow Extension: Strengthening;Both;5 reps;Seated;Theraband Theraband Level (Elbow Extension): Level 1 (Yellow)   Shoulder Instructions       General Comments BP WFL throughout, O2 WFL on RA    Pertinent Vitals/ Pain       Pain Assessment: No/denies pain  Home Living                                          Prior Functioning/Environment              Frequency  Min 2X/week        Progress Toward Goals  OT Goals(current goals can now be found in the care plan section)  Progress towards OT goals: Progressing toward goals  Acute Rehab OT Goals Patient Stated Goal: continue to get better OT Goal Formulation: With patient Time For Goal Achievement: 05/24/20 Potential to Achieve Goals: Good ADL Goals Pt Will Perform Grooming: with min assist;standing Pt Will Perform Upper Body Bathing: with min assist;sitting Pt Will Perform Lower Body Bathing: with min assist;sitting/lateral leans;sit to/from stand Pt Will Perform Lower Body Dressing: with min assist;sitting/lateral leans;with adaptive equipment;sit to/from stand Pt Will Transfer to Toilet: with min assist;stand pivot transfer;bedside commode Pt/caregiver will Perform Home Exercise Program: Increased strength;Both right and left upper extremity;With theraband;With written HEP provided;With Supervision Additional ADL Goal #1: Pt to demonstrate implementation of at least 2 energy conservation strategies during ADLs  Plan Discharge plan remains appropriate;Discharge plan needs to be updated    Co-evaluation                  AM-PAC OT "6 Clicks" Daily Activity     Outcome Measure   Help from another person eating meals?: None Help from another person taking care of personal grooming?: A Little Help from another person toileting, which includes using toliet, bedpan, or urinal?: A Little Help from another person bathing (including washing, rinsing, drying)?: A Lot Help from another person to put on and taking off regular upper body clothing?: A Little Help from another person to put on and taking off regular lower body clothing?: A Lot 6 Click Score: 17    End of Session Equipment Utilized During Treatment: Gait belt;Rolling walker  OT Visit Diagnosis: Unsteadiness on feet (R26.81);Other abnormalities of gait and mobility (R26.89);Muscle weakness (generalized) (M62.81)   Activity Tolerance Patient tolerated treatment well;Patient limited by fatigue   Patient Left in chair;with call bell/phone within reach;with chair alarm set   Nurse Communication Mobility status        Time: 0930-1006 OT Time Calculation (min): 36 min  Charges: OT General Charges $OT Visit: 1 Visit OT Treatments $Self Care/Home Management : 8-22 mins $Therapeutic Exercise: 8-22 mins  Malachy Chamber, OTR/L Acute Rehab Services Office: [phone]   Layla Maw 05/18/2020, 10:19 AM

## 2020-05-18 NOTE — Discharge Instructions (Signed)
Information on my medicine - ELIQUIS (apixaban)  This medication education was reviewed with me or my healthcare representative as part of my discharge preparation.    Why was Eliquis prescribed for you? Eliquis was prescribed to treat blood clots that may have been found in the veins of your legs (deep vein thrombosis) or in your lungs (pulmonary embolism) and to reduce the risk of them occurring again.  What do You need to know about Eliquis ? The starting dose is 10 mg (two 5 mg tablets) taken TWICE daily for the FIRST SEVEN (7) DAYS, then on (enter date)  April 20th the dose is reduced to ONE 5 mg tablet taken TWICE daily.  Eliquis may be taken with or without food.   Try to take the dose about the same time in the morning and in the evening. If you have difficulty swallowing the tablet whole please discuss with your pharmacist how to take the medication safely.  Take Eliquis exactly as prescribed and DO NOT stop taking Eliquis without talking to the doctor who prescribed the medication.  Stopping may increase your risk of developing a new blood clot.  Refill your prescription before you run out.  After discharge, you should have regular check-up appointments with your healthcare provider that is prescribing your Eliquis.    What do you do if you miss a dose? If a dose of ELIQUIS is not taken at the scheduled time, take it as soon as possible on the same day and twice-daily administration should be resumed. The dose should not be doubled to make up for a missed dose.  Important Safety Information A possible side effect of Eliquis is bleeding. You should call your healthcare provider right away if you experience any of the following: ? Bleeding from an injury or your nose that does not stop. ? Unusual colored urine (red or dark brown) or unusual colored stools (red or black). ? Unusual bruising for unknown reasons. ? A serious fall or if you hit your head (even if there is no  bleeding).  Some medicines may interact with Eliquis and might increase your risk of bleeding or clotting while on Eliquis. To help avoid this, consult your healthcare provider or pharmacist prior to using any new prescription or non-prescription medications, including herbals, vitamins, non-steroidal anti-inflammatory drugs (NSAIDs) and supplements.  This website has more information on Eliquis (apixaban): http://www.eliquis.com/eliquis/home

## 2020-05-19 ENCOUNTER — Telehealth: Payer: Self-pay

## 2020-05-19 ENCOUNTER — Other Ambulatory Visit: Payer: Self-pay | Admitting: Family Medicine

## 2020-05-19 ENCOUNTER — Telehealth: Payer: Self-pay | Admitting: *Deleted

## 2020-05-19 DIAGNOSIS — M47812 Spondylosis without myelopathy or radiculopathy, cervical region: Secondary | ICD-10-CM | POA: Diagnosis not present

## 2020-05-19 DIAGNOSIS — M4050 Lordosis, unspecified, site unspecified: Secondary | ICD-10-CM | POA: Diagnosis not present

## 2020-05-19 DIAGNOSIS — M48061 Spinal stenosis, lumbar region without neurogenic claudication: Secondary | ICD-10-CM | POA: Diagnosis not present

## 2020-05-19 DIAGNOSIS — I2699 Other pulmonary embolism without acute cor pulmonale: Secondary | ICD-10-CM | POA: Diagnosis not present

## 2020-05-19 DIAGNOSIS — I82409 Acute embolism and thrombosis of unspecified deep veins of unspecified lower extremity: Secondary | ICD-10-CM | POA: Diagnosis not present

## 2020-05-19 DIAGNOSIS — K760 Fatty (change of) liver, not elsewhere classified: Secondary | ICD-10-CM | POA: Diagnosis not present

## 2020-05-19 DIAGNOSIS — I700 Atherosclerosis of aorta: Secondary | ICD-10-CM | POA: Diagnosis not present

## 2020-05-19 DIAGNOSIS — J9601 Acute respiratory failure with hypoxia: Secondary | ICD-10-CM | POA: Diagnosis not present

## 2020-05-19 DIAGNOSIS — I2510 Atherosclerotic heart disease of native coronary artery without angina pectoris: Secondary | ICD-10-CM | POA: Diagnosis not present

## 2020-05-19 DIAGNOSIS — J432 Centrilobular emphysema: Secondary | ICD-10-CM | POA: Diagnosis not present

## 2020-05-19 DIAGNOSIS — M4313 Spondylolisthesis, cervicothoracic region: Secondary | ICD-10-CM | POA: Diagnosis not present

## 2020-05-19 DIAGNOSIS — K5730 Diverticulosis of large intestine without perforation or abscess without bleeding: Secondary | ICD-10-CM | POA: Diagnosis not present

## 2020-05-19 DIAGNOSIS — N1832 Chronic kidney disease, stage 3b: Secondary | ICD-10-CM | POA: Diagnosis not present

## 2020-05-19 DIAGNOSIS — M4804 Spinal stenosis, thoracic region: Secondary | ICD-10-CM | POA: Diagnosis not present

## 2020-05-19 DIAGNOSIS — M5124 Other intervertebral disc displacement, thoracic region: Secondary | ICD-10-CM | POA: Diagnosis not present

## 2020-05-19 DIAGNOSIS — I130 Hypertensive heart and chronic kidney disease with heart failure and stage 1 through stage 4 chronic kidney disease, or unspecified chronic kidney disease: Secondary | ICD-10-CM | POA: Diagnosis not present

## 2020-05-19 DIAGNOSIS — M50322 Other cervical disc degeneration at C5-C6 level: Secondary | ICD-10-CM | POA: Diagnosis not present

## 2020-05-19 DIAGNOSIS — N400 Enlarged prostate without lower urinary tract symptoms: Secondary | ICD-10-CM | POA: Diagnosis not present

## 2020-05-19 DIAGNOSIS — J189 Pneumonia, unspecified organism: Secondary | ICD-10-CM | POA: Diagnosis not present

## 2020-05-19 DIAGNOSIS — I5033 Acute on chronic diastolic (congestive) heart failure: Secondary | ICD-10-CM | POA: Diagnosis not present

## 2020-05-19 DIAGNOSIS — M4802 Spinal stenosis, cervical region: Secondary | ICD-10-CM | POA: Diagnosis not present

## 2020-05-19 DIAGNOSIS — M5134 Other intervertebral disc degeneration, thoracic region: Secondary | ICD-10-CM | POA: Diagnosis not present

## 2020-05-19 DIAGNOSIS — M47814 Spondylosis without myelopathy or radiculopathy, thoracic region: Secondary | ICD-10-CM | POA: Diagnosis not present

## 2020-05-19 DIAGNOSIS — M4312 Spondylolisthesis, cervical region: Secondary | ICD-10-CM | POA: Diagnosis not present

## 2020-05-19 DIAGNOSIS — R7303 Prediabetes: Secondary | ICD-10-CM | POA: Diagnosis not present

## 2020-05-19 LAB — PROTEIN ELECTROPHORESIS, SERUM
A/G Ratio: 1 (ref 0.7–1.7)
Albumin ELP: 2.5 g/dL — ABNORMAL LOW (ref 2.9–4.4)
Alpha-1-Globulin: 0.3 g/dL (ref 0.0–0.4)
Alpha-2-Globulin: 1.1 g/dL — ABNORMAL HIGH (ref 0.4–1.0)
Beta Globulin: 0.7 g/dL (ref 0.7–1.3)
Gamma Globulin: 0.2 g/dL — ABNORMAL LOW (ref 0.4–1.8)
Globulin, Total: 2.4 g/dL (ref 2.2–3.9)
Total Protein ELP: 4.9 g/dL — ABNORMAL LOW (ref 6.0–8.5)

## 2020-05-19 LAB — MISC LABCORP TEST (SEND OUT): Labcorp test code: 164798

## 2020-05-19 NOTE — Telephone Encounter (Signed)
Beth from [facility] called to let Dr. Sarajane Jews know that she did a start of care for disease and medication management  She needs Dr. Sarajane Jews to send a order to [facility] for a bedside commode  She also needs verbal orders for [facility] 1 time a week for 1 week  2 times a week for 2 weeks 1 time a week for 6 weeks  She also needs verbal orders for [facility] for bathing  2 times a week for 6 weeks 1 time a week for 1 week  The patient also wanted to know if he can still take Zolpidim 10 mg at bedtime to help him sleep  The patient was also prescribed Spariva and the medication is over $400 and he can't afford that. Is there an alternative that is cheaper?  [place] [phone]

## 2020-05-19 NOTE — Telephone Encounter (Signed)
Spoke with patient and scheduled an in-person Palliative Consult for 06/03/20 @ 9AM  COVID screening was negative. No pets in home. Patient lives alone.  Consent obtained; updated Outlook/Netsmart/Team List and Epic.  Family is aware they may be receiving a call from NP the day before or day of to confirm appointment.

## 2020-05-19 NOTE — Chronic Care Management (AMB) (Signed)
  Chronic Care Management   Note  05/19/2020 Name: GEOFREY SILLIMAN MRN: [id number] DOB: [birth year]  VIRAAT VANPATTEN is a 75 y.o. year old male who is a primary care patient of [name], MD. I reached out to [name] by phone today in response to a referral sent by Mr. Praneel Haisley Heiberger's PCP, [name], MD     Mr. Haring was given information about Chronic Care Management services today including:  1. CCM service includes personalized support from designated clinical staff supervised by his physician, including individualized plan of care and coordination with other care providers 2. 24/7 contact phone numbers for assistance for urgent and routine care needs. 3. Service will only be billed when office clinical staff spend 20 minutes or more in a month to coordinate care. 4. Only one practitioner may furnish and bill the service in a calendar month. 5. The patient may stop CCM services at any time (effective at the end of the month) by phone call to the office staff. 6. The patient will be responsible for cost sharing (co-pay) of up to 20% of the service fee (after annual deductible is met).  Patient agreed to services and verbal consent obtained.   Follow up plan: Telephone appointment with care management team member scheduled for: 05/24/2020  [facility]

## 2020-05-19 NOTE — Telephone Encounter (Cosign Needed)
Transition Care Management Follow-up Telephone Call  Date of discharge and from where: [facility]/18/2020  [facility]   How have you been since you were released from the hospital? Doing well  Any questions or concerns? No  Items Reviewed:  Did the pt receive and understand the discharge instructions provided? Yes   Medications obtained and verified? Yes   Other? No   Any new allergies since your discharge? No   Dietary orders reviewed? Yes  Do you have support at home? Yes   Home Care and Equipment/Supplies: Were home health services ordered? yes If so, what is the name of the agency? [facility]  Has the agency set up a time to come to the patient's home? yes Were any new equipment or medical supplies ordered?  yes What is the name of the medical supply agency? [facility] Were you able to get the supplies/equipment? yes Do you have any questions related to the use of the equipment or supplies? No  Functional Questionnaire: (I = Independent and D = Dependent) ADLs: D  Bathing/Dressing- D  Meal Prep- D  Eating- D  Maintaining continence- I  Transferring/Ambulation- D  Managing Meds- D  Follow up appointments reviewed:   PCP Hospital f/u appt confirmed? Yes  Scheduled to see Dr. Sarajane Jews  on 05/30/2020 @ 0315pm  Specialist Hospital f/u appt confirmed? Yes  Scheduled to see Rheummatology    Are transportation arrangements needed? No   If their condition worsens, is the pt aware to call PCP or go to the Emergency Dept.? Yes  Was the patient provided with contact information for the PCP's office or ED? Yes  Was to pt encouraged to call back with questions or concerns? Yes

## 2020-05-20 DIAGNOSIS — J9601 Acute respiratory failure with hypoxia: Secondary | ICD-10-CM | POA: Diagnosis not present

## 2020-05-20 DIAGNOSIS — J189 Pneumonia, unspecified organism: Secondary | ICD-10-CM | POA: Diagnosis not present

## 2020-05-20 DIAGNOSIS — I2699 Other pulmonary embolism without acute cor pulmonale: Secondary | ICD-10-CM | POA: Diagnosis not present

## 2020-05-20 DIAGNOSIS — I2510 Atherosclerotic heart disease of native coronary artery without angina pectoris: Secondary | ICD-10-CM | POA: Diagnosis not present

## 2020-05-20 DIAGNOSIS — I130 Hypertensive heart and chronic kidney disease with heart failure and stage 1 through stage 4 chronic kidney disease, or unspecified chronic kidney disease: Secondary | ICD-10-CM | POA: Diagnosis not present

## 2020-05-20 DIAGNOSIS — I82409 Acute embolism and thrombosis of unspecified deep veins of unspecified lower extremity: Secondary | ICD-10-CM | POA: Diagnosis not present

## 2020-05-20 LAB — MPO/PR-3 (ANCA) ANTIBODIES
ANCA Proteinase 3: <3.5 U/mL (ref 0.0–3.5)
Myeloperoxidase Abs: <9 U/mL (ref 0.0–9.0)

## 2020-05-23 ENCOUNTER — Telehealth: Payer: Self-pay | Admitting: Family Medicine

## 2020-05-23 DIAGNOSIS — I2510 Atherosclerotic heart disease of native coronary artery without angina pectoris: Secondary | ICD-10-CM | POA: Diagnosis not present

## 2020-05-23 DIAGNOSIS — Z79899 Other long term (current) drug therapy: Secondary | ICD-10-CM | POA: Diagnosis not present

## 2020-05-23 DIAGNOSIS — I82403 Acute embolism and thrombosis of unspecified deep veins of lower extremity, bilateral: Secondary | ICD-10-CM | POA: Diagnosis not present

## 2020-05-23 DIAGNOSIS — I82409 Acute embolism and thrombosis of unspecified deep veins of unspecified lower extremity: Secondary | ICD-10-CM | POA: Diagnosis not present

## 2020-05-23 DIAGNOSIS — I2699 Other pulmonary embolism without acute cor pulmonale: Secondary | ICD-10-CM | POA: Diagnosis not present

## 2020-05-23 DIAGNOSIS — J9601 Acute respiratory failure with hypoxia: Secondary | ICD-10-CM | POA: Diagnosis not present

## 2020-05-23 DIAGNOSIS — M2550 Pain in unspecified joint: Secondary | ICD-10-CM | POA: Diagnosis not present

## 2020-05-23 DIAGNOSIS — J189 Pneumonia, unspecified organism: Secondary | ICD-10-CM | POA: Diagnosis not present

## 2020-05-23 DIAGNOSIS — M0579 Rheumatoid arthritis with rheumatoid factor of multiple sites without organ or systems involvement: Secondary | ICD-10-CM | POA: Diagnosis not present

## 2020-05-23 DIAGNOSIS — R509 Fever, unspecified: Secondary | ICD-10-CM | POA: Diagnosis not present

## 2020-05-23 DIAGNOSIS — L409 Psoriasis, unspecified: Secondary | ICD-10-CM | POA: Diagnosis not present

## 2020-05-23 DIAGNOSIS — M150 Primary generalized (osteo)arthritis: Secondary | ICD-10-CM | POA: Diagnosis not present

## 2020-05-23 DIAGNOSIS — R531 Weakness: Secondary | ICD-10-CM | POA: Diagnosis not present

## 2020-05-23 DIAGNOSIS — I130 Hypertensive heart and chronic kidney disease with heart failure and stage 1 through stage 4 chronic kidney disease, or unspecified chronic kidney disease: Secondary | ICD-10-CM | POA: Diagnosis not present

## 2020-05-23 NOTE — Telephone Encounter (Signed)
Tyrone Roman is calling and wanting to let the provider know that she saw patient today and BP was 156/82 and heart rate was 100. Over the weekend patient called in to the on call nurse because he felt quite dizzy when doing therapy and had a temperature of 103.5. Pt was advised to go to the ER but declined.  Also patient has edema in his feet/ ankles and reported having dark stools. Nurse is requesting a call back [phone].Marland Kitchen

## 2020-05-23 NOTE — Telephone Encounter (Signed)
Please advise if ok for verbal orders requested

## 2020-05-24 ENCOUNTER — Ambulatory Visit: Payer: Medicare Other | Admitting: *Deleted

## 2020-05-24 ENCOUNTER — Telehealth: Payer: Self-pay | Admitting: Pulmonary Disease

## 2020-05-24 ENCOUNTER — Ambulatory Visit (INDEPENDENT_AMBULATORY_CARE_PROVIDER_SITE_OTHER): Payer: Medicare Other

## 2020-05-24 DIAGNOSIS — I130 Hypertensive heart and chronic kidney disease with heart failure and stage 1 through stage 4 chronic kidney disease, or unspecified chronic kidney disease: Secondary | ICD-10-CM | POA: Diagnosis not present

## 2020-05-24 DIAGNOSIS — N179 Acute kidney failure, unspecified: Secondary | ICD-10-CM

## 2020-05-24 DIAGNOSIS — J9601 Acute respiratory failure with hypoxia: Secondary | ICD-10-CM | POA: Diagnosis not present

## 2020-05-24 DIAGNOSIS — N1832 Chronic kidney disease, stage 3b: Secondary | ICD-10-CM

## 2020-05-24 DIAGNOSIS — I25118 Atherosclerotic heart disease of native coronary artery with other forms of angina pectoris: Secondary | ICD-10-CM

## 2020-05-24 DIAGNOSIS — I2510 Atherosclerotic heart disease of native coronary artery without angina pectoris: Secondary | ICD-10-CM | POA: Diagnosis not present

## 2020-05-24 DIAGNOSIS — J439 Emphysema, unspecified: Secondary | ICD-10-CM

## 2020-05-24 DIAGNOSIS — E782 Mixed hyperlipidemia: Secondary | ICD-10-CM | POA: Diagnosis not present

## 2020-05-24 DIAGNOSIS — J1282 Pneumonia due to coronavirus disease 2019: Secondary | ICD-10-CM

## 2020-05-24 DIAGNOSIS — J189 Pneumonia, unspecified organism: Secondary | ICD-10-CM | POA: Diagnosis not present

## 2020-05-24 DIAGNOSIS — I2699 Other pulmonary embolism without acute cor pulmonale: Secondary | ICD-10-CM | POA: Diagnosis not present

## 2020-05-24 DIAGNOSIS — U071 COVID-19: Secondary | ICD-10-CM

## 2020-05-24 DIAGNOSIS — I82409 Acute embolism and thrombosis of unspecified deep veins of unspecified lower extremity: Secondary | ICD-10-CM | POA: Diagnosis not present

## 2020-05-24 MED ORDER — SPIRIVA RESPIMAT 2.5 MCG/ACT IN AERS: 2.0000 | INHALATION_SPRAY | Freq: Every day | RESPIRATORY_TRACT | 0 refills | Status: DC

## 2020-05-24 NOTE — Patient Instructions (Signed)
Visit Information:  Thank you for speaking with me today Tyrone Roman.  Please let me know if you change your mind about wanting to receive social work services, support and resources.  Take care!   PATIENT GOALS:  Goals Addressed   None     Consent to CCM Services: [name] was given information about Chronic Care Management services today including:  1. CCM service includes personalized support from designated clinical staff supervised by his physician, including individualized plan of care and coordination with other care providers 2. 24/7 contact phone numbers for assistance for urgent and routine care needs. 3. Service will only be billed when office clinical staff spend 20 minutes or more in a month to coordinate care. 4. Only one practitioner may furnish and bill the service in a calendar month. 5. The patient may stop CCM services at any time (effective at the end of the month) by phone call to the office staff. 6. The patient will be responsible for cost sharing (co-pay) of up to 20% of the service fee (after annual deductible is met).  Patient agreed to services and verbal consent obtained.   Patient verbalizes understanding of instructions provided today and agrees to view in [place].   No further follow up required.  [place] LCSW Licensed Clinical Social Worker [facility] [phone]  CLINICAL CARE PLAN: Patient Care Plan: RNCM: Cardiovascular Disease- HTN/hypotension, HLD, CAD    Problem Identified: Lack of long term management of Cardiovascular disease   Priority: Medium    Long-Range Goal: Effective long term self management of Cardiovascular disease(HTN/hypotension, CAD, HLD)   Start Date: 05/24/2020  Expected End Date: 11/23/2020  This Visit's Progress: On track  Priority: Medium  Note:   Current Barriers:  Marland Kitchen Knowledge deficits related to self health management of cardiovascular disease:HTN/hypotension,CAD and HLD . Knowledge Deficits  related to HTN/hypotension, CAD and HLD . Chronic Disease Management support and education needs related to HTN/hypotension, CAD and HLD . Difficulty obtaining medications  . Unable to perform ADLs independently . Unable to perform IADLs independently . Pt has had 4 hospitalizations and stay at [facility] for COVID and sepsis since January.  Pt lives alone but daughter Tyrone Roman assists pt with care.  Pt has [facility] for nursing, PT and OT.  Reports his B/P has been going up and down.  Reports some dizziness on standing when his B/P is lower.  Denies any falls since returning home Nurse Case Manager Clinical Goal(s):   patient will take all medications exactly as prescribed and will call provider for medication related questions  patient will verbalize understanding of cardiovascular symptoms and when to call doctor  patient will verbalize understanding of plan for HTN/hypotension, CAD and HLD  patient will work with primary care provider and CCM team to address needs related to HTN/hypotension, CAD and HLD  patient will attend all scheduled medical appointments: Dr. Sarajane Jews 05/30/20  patient will demonstrate improved adherence to prescribed treatment plan for HTN/hypotension, CAD and HLD as evidenced by improved B/P in range, adherence to medications and  reports  of improvement   patient will work with CM team pharmacist to assist with pharmacy assistance and adherence  patient will work with CM clinical social worker to get resources for possible VA benefits for personal care service and caregiver stress Interventions:  . Collaboration with [name], MD regarding development and update of comprehensive plan of care as evidenced by provider attestation and co-signature . Inter-disciplinary care team collaboration (see longitudinal plan  of care) . Basic overview and discussion of HTN/hypotension, CAD and HLD . Medications reviewed . Evaluation of current treatment plan related to HTN/hypotension,  CAD and HLD and patient's adherence to plan as established by provider. . Provided education to patient re: "HTN/hypotension, CAD and HLD . Reviewed medications with patient and discussed adherence and cost issues . Advised patient, providing education and rationale, to monitor blood pressure daily and record, calling provider for findings outside established parameters.  . Provided patient with verbal educational materials related to HTN/hypotension, CAD and HLD . Reviewed scheduled/upcoming provider appointments including:  . Social Work referral for resources for possible VA benefits for personal care service and caregiver stress . Pharmacy referral for pharmacy assistance pt is active with CCM pharmacist  . Discussed plans with patient for ongoing care management follow up and provided patient with direct contact information for care management team Patient Goals/Self-Care Activities: -check blood pressure 3 times per week - choose a place to take my blood pressure (home, clinic or office, retail store) - write blood pressure results in a log or diary  -obtain a blood pressure monitor Follow Up Plan: Telephone follow up appointment with care management team member scheduled for: 06/09/20 at 10 AM The patient has been provided with contact information for the care management team and has been advised to call with any health related questions or concerns.      Patient Care Plan: RNCM: Post COVID/COPD    Problem Identified: Lack of long term self management plan of post COVID/COPD   Priority: High    Long-Range Goal: Long term effective self mangement of post COVID/COPD   Start Date: 05/24/2020  Expected End Date: 11/23/2020  This Visit's Progress: On track  Priority: High  Note:   Current Barriers:   Knowledge deficit related to basic COPD/post COVID self care/management  Knowledge deficit related to basic understanding of how to use inhalers and how inhaled medications  work  Knowledge deficit related to importance of energy conservation  Unable to perform ADLs independently  Unable to perform IADLs independently   Pt has had 4 hospitalizations and stay at [facility] for COVID and sepsis since January.  Pt lives alone but daughter Tyrone Roman assists pt with care.  Pt has [place] for nursing, PT and OT.  States he is not able to walk due to weakness and breathing.  States he can stand to transfer to wheelchair.  States he has a hospital bed but the trapeze bar was not ordered which would help him transfer.  States he is wearing O2 at 2 l/min most of the time.  Pt states that his Spiriva inhaler is very expensive and he would like something that does not cost as much.  States he needs an RX for his Symbicort sent to his pharmacy as he is using the [facility] that he took home from the SNF.  States his temperature was up over the weekend and it was 99.5 today Nurse Case Manager Clinical Goal(s):  patient will report using inhalers as prescribed including rinsing mouth after use  patient will report utilizing pursed lip breathing for shortness of breath  patient will be able to verbalize understanding of COPD action plan and when to seek appropriate levels of medical care  patient will engage in lite exercise as tolerated to build/regain stamina and strength and reduce shortness of breath through activity tolerance   patient will verbalize basic understanding of COVID-19 impact on individual health and self health management as evidenced by  verbalization of basic understanding of COVID-19 as a viral disease,  Interventions:  . Collaboration with [name], MD regarding development and update of comprehensive plan of care as evidenced by provider attestation and co-signature- pt has concerns for cost of Spiriva, needs RX for Symbicort and would like an order for  Trapeze bar to go on his hospital bed . Inter-disciplinary care team collaboration (see longitudinal plan of  care)  Provided patient with basic written and verbal COPD education on self care/management/and exacerbation prevention   Provided patient with COPD action plan and reinforced importance of daily self assessment  Provided written and verbal instructions on pursed lip breathing and utilized returned demonstration as teach back  Provided  instruction on COVID-19 impact on individual health and self health management as evidenced by verbalization of basic understanding of COVID-19 as a viral disease,   Collaborated with CCM pharmacist about concerns with cost of inhalers   Patient Goals/Self-Care Activities - Take medications as prescribed including inhalers - Practice and use pursed lip breathing for shortness of breath recovery and prevention - Self assess COPD action plan zone and make appointment with provider if you have been in the yellow zone for 48 hours without   improvement. - Engage in lite exercise as tolerated 3-5 days a week - Utilize infection prevention strategies to reduce risk of respiratory infection   - develop a rescue plan - follow rescue plan if symptoms flare-up - keep follow-up appointments Follow Up Plan: Telephone follow up appointment with care management team member scheduled for: 06/09/20 at 10 AM The patient has been provided with contact information for the care management team and has been advised to call with any health related questions or concerns.

## 2020-05-24 NOTE — Chronic Care Management (AMB) (Signed)
Chronic Care Management   CCM RN Visit Note  05/24/2020 Name: Tyrone Roman MRN: [id number] DOB: [birth year]  Subjective: Tyrone Roman is a 75 y.o. year old male who is a primary care patient of [name], MD. The care management team was consulted for assistance with disease management and care coordination needs.    Engaged with patient by telephone for initial visit in response to provider referral for case management and/or care coordination services.   Consent to Services:  The patient was given the following information about Chronic Care Management services today, agreed to services, and gave verbal consent: 1. CCM service includes personalized support from designated clinical staff supervised by the primary care provider, including individualized plan of care and coordination with other care providers 2. 24/7 contact phone numbers for assistance for urgent and routine care needs. 3. Service will only be billed when office clinical staff spend 20 minutes or more in a month to coordinate care. 4. Only one practitioner may furnish and bill the service in a calendar month. 5.The patient may stop CCM services at any time (effective at the end of the month) by phone call to the office staff. 6. The patient will be responsible for cost sharing (co-pay) of up to 20% of the service fee (after annual deductible is met). Patient agreed to services and consent obtained.  Patient agreed to services and verbal consent obtained.   Assessment: Review of patient past medical history, allergies, medications, health status, including review of consultants reports, laboratory and other test data, was performed as part of comprehensive evaluation and provision of chronic care management services.   SDOH (Social Determinants of Health) assessments and interventions performed:  SDOH Interventions   Flowsheet Row Most Recent Value  SDOH Interventions   Food Insecurity Interventions Intervention Not  Indicated  Housing Interventions Intervention Not Indicated  Transportation Interventions Intervention Not Indicated  [would like to information on resources as a back up]       CCM Care Plan  Allergies  Allergen Reactions  . Cephalexin Shortness Of Breath, Rash and Other (See Comments)    Tolerated Augmentin 2015 and 2017 (12-2017). Tolerated nafcillin 12/2017 PATIENT HAS HAD A PCN REACTION WITH IMMEDIATE RASH, FACIAL/TONGUE/THROAT SWELLING, SOB, OR LIGHTHEADEDNESS WITH HYPOTENSION:  #  #  YES  #  #  Has patient had a PCN reaction causing severe rash involving mucus membranes or skin necrosis: No Has patient had a PCN reaction that required hospitalization: No Has patient had a PCN reaction occurring within the last 10 years: No If all of the above answers are "NO", then may proceed  . Clarithromycin Shortness Of Breath and Rash  . Certolizumab Pegol Hives  . Tocilizumab Hives  . Doxycycline Rash  . Hydroxyzine Rash  . Lidoderm Other (See Comments)    "made me act weird"  . Pyrithione Zinc Rash    Outpatient Encounter Medications as of 05/24/2020  Medication Sig  . acetaminophen (TYLENOL) 325 MG tablet Take 650 mg by mouth every 6 (six) hours as needed for headache, mild pain or fever.  Marland Kitchen albuterol (VENTOLIN HFA) 108 (90 Base) MCG/ACT inhaler Inhale 2 puffs into the lungs every 4 (four) hours as needed for wheezing or shortness of breath.  . APIXABAN (ELIQUIS) VTE STARTER PACK (10MG AND 5MG) Take as directed on package: start with two-53m tablets twice daily for 7 days. On day 8, switch to one-572mtablet twice daily.  . budesonide-formoterol (SYMBICORT) 160-4.5 MCG/ACT inhaler Inhale 2 puffs  into the lungs in the morning and at bedtime.  . lactose free nutrition (BOOST PLUS) LIQD Take 237 mLs by mouth 3 (three) times daily with meals.  . melatonin 5 MG TABS Take 5 mg by mouth at bedtime.  . Menthol (ICY HOT) 5 % PTCH Apply 1 patch topically 2 (two) times daily. Apply to lower back.   . metoprolol tartrate (LOPRESSOR) 25 MG tablet Take 1 tablet (25 mg total) by mouth 2 (two) times daily.  Marland Kitchen omeprazole (PRILOSEC) 40 MG capsule Take 1 capsule (40 mg total) by mouth daily.  . predniSONE (DELTASONE) 10 MG tablet Take 3 tablets (30 mg total) by mouth every 12 (twelve) hours.  . simvastatin (ZOCOR) 20 MG tablet Take 1 tablet (20 mg total) by mouth daily.  . tamsulosin (FLOMAX) 0.4 MG CAPS capsule Take 1 capsule (0.4 mg total) by mouth daily.  . Tiotropium Bromide Monohydrate (SPIRIVA RESPIMAT) 2.5 MCG/ACT AERS Inhale 2 puffs into the lungs daily.   No facility-administered encounter medications on file as of 05/24/2020.    Patient Active Problem List   Diagnosis Date Noted  . Weakness of both lower extremities   . FUO (fever of unknown origin)   . Sepsis due to pneumonia ([place]) 05/09/2020  . Post-COVID-19 syndrome manifesting as chronic cough 05/09/2020  . COPD without exacerbation ([place]) 05/09/2020  . Essential hypertension 04/26/2020  . Acute hypoxemic respiratory failure due to COVID-19 ([place]) 04/03/2020  . Acute respiratory failure with hypoxia ([place]) 03/28/2020  . Physical deconditioning 03/26/2020  . Normocytic anemia 03/26/2020  . Severe sepsis ([place]) 03/25/2020  . Multifocal pneumonia 03/17/2020  . COPD with acute exacerbation ([place]) 03/17/2020  . Hypoxia 03/17/2020  . CAD (coronary artery disease) 03/17/2020  . Thrombocytopenia ([place]) 03/08/2020  . Stage 3b chronic kidney disease ([place]) 03/08/2020  . Pneumonia due to COVID-19 virus 02/29/2020  . Acute respiratory failure due to COVID-19 ([place]) 02/29/2020  . Closed nondisplaced fracture of shaft of third metacarpal bone of left hand 03/04/2019  . Wound infection after surgery 01/01/2018  . Medication monitoring encounter 01/01/2018  . Sepsis ([place]) 12/10/2017  . HNP (herniated nucleus pulposus), lumbar 10/07/2017  . Lumbar herniated disc 10/07/2017  . Psoriasis of scalp 03/27/2016  . Rheumatoid arthritis ([place])  03/25/2013  . GI bleeding 02/20/2012  . Dyspnea on exertion 09/08/2010  . COPD (chronic obstructive pulmonary disease) ([place]) 08/04/2010  . Bruising 08/04/2010  . TINNITUS 12/16/2009  . Dizziness and giddiness 12/16/2009  . NECK SPRAIN AND STRAIN 10/21/2009  . LUMBAR SPRAIN AND STRAIN 10/21/2009  . Hyperlipidemia 06/10/2009  . Coronary artery disease of native artery of native heart with stable angina pectoris ([place]) 06/10/2009  . GERD 06/10/2009  . BARRETTS ESOPHAGUS 06/10/2009  . CONCUSSION WITH LOC OF 30 MINUTES OR LESS 02/28/2009  . NEPHROLITHIASIS 07/01/2007  . CONTACT DERMATITIS 12/12/2006  . BPH (benign prostatic hyperplasia) 11/04/2006  . COLONIC POLYPS 10/21/2003  . DIVERTICULOSIS, COLON 10/21/2003    Conditions to be addressed/monitored:CAD, HTN, HLD, COPD and post COVID syndrome  Care Plan : RNCM: Cardiovascular Disease- HTN/hypotension, HLD, CAD  Updates made by Tyrone Ped, RN since 05/24/2020 12:00 AM    Problem: Lack of long term management of Cardiovascular disease   Priority: Medium    Long-Range Goal: Effective long term self management of Cardiovascular disease(HTN/hypotension, CAD, HLD)   Start Date: 05/24/2020  Expected End Date: 11/23/2020  This Visit's Progress: On track  Priority: Medium  Note:   Current Barriers:  Marland Kitchen Knowledge deficits related to  self health management of cardiovascular disease:HTN/hypotension,CAD and HLD . Knowledge Deficits related to HTN/hypotension, CAD and HLD . Chronic Disease Management support and education needs related to HTN/hypotension, CAD and HLD . Difficulty obtaining medications  . Unable to perform ADLs independently . Unable to perform IADLs independently . Pt has had 4 hospitalizations and stay at [facility] for COVID and sepsis since January.  Pt lives alone but daughter Tyrone Roman assists pt with care.  Pt has [facility] for nursing, PT and OT.  Reports his B/P has been going up and down.  Reports some dizziness on standing  when his B/P is lower.  Denies any falls since returning home Nurse Case Manager Clinical Goal(s):   patient will take all medications exactly as prescribed and will call provider for medication related questions  patient will verbalize understanding of cardiovascular symptoms and when to call doctor  patient will verbalize understanding of plan for HTN/hypotension, CAD and HLD  patient will work with primary care provider and CCM team to address needs related to HTN/hypotension, CAD and HLD  patient will attend all scheduled medical appointments: Dr. Sarajane Jews 05/30/20  patient will demonstrate improved adherence to prescribed treatment plan for HTN/hypotension, CAD and HLD as evidenced by improved B/P in range, adherence to medications and  reports  of improvement   patient will work with CM team pharmacist to assist with pharmacy assistance and adherence  patient will work with CM clinical social worker to get resources for possible VA benefits for personal care service and caregiver stress Interventions:  . Collaboration with [name], MD regarding development and update of comprehensive plan of care as evidenced by provider attestation and co-signature . Inter-disciplinary care team collaboration (see longitudinal plan of care) . Basic overview and discussion of HTN/hypotension, CAD and HLD . Medications reviewed . Evaluation of current treatment plan related to HTN/hypotension, CAD and HLD and patient's adherence to plan as established by provider. . Provided education to patient re: "HTN/hypotension, CAD and HLD . Reviewed medications with patient and discussed adherence and cost issues . Advised patient, providing education and rationale, to monitor blood pressure daily and record, calling provider for findings outside established parameters.  . Provided patient with verbal educational materials related to HTN/hypotension, CAD and HLD . Reviewed scheduled/upcoming provider  appointments including:  . Social Work referral for resources for possible VA benefits for personal care service and caregiver stress . Pharmacy referral for pharmacy assistance pt is active with CCM pharmacist  . Discussed plans with patient for ongoing care management follow up and provided patient with direct contact information for care management team Patient Goals/Self-Care Activities: -check blood pressure 3 times per week - choose a place to take my blood pressure (home, clinic or office, retail store) - write blood pressure results in a log or diary  -obtain a blood pressure monitor Follow Up Plan: Telephone follow up appointment with care management team member scheduled for: 06/09/20 at 10 AM The patient has been provided with contact information for the care management team and has been advised to call with any health related questions or concerns.      Care Plan : RNCM: Post COVID/COPD  Updates made by Tyrone Ped, RN since 05/24/2020 12:00 AM    Problem: Lack of long term self management plan of post COVID/COPD   Priority: High    Long-Range Goal: Long term effective self mangement of post COVID/COPD   Start Date: 05/24/2020  Expected End Date: 11/23/2020  This Visit's Progress:  On track  Priority: High  Note:   Current Barriers:   Knowledge deficit related to basic COPD/post COVID self care/management  Knowledge deficit related to basic understanding of how to use inhalers and how inhaled medications work  Knowledge deficit related to importance of energy conservation  Unable to perform ADLs independently  Unable to perform IADLs independently   Pt has had 4 hospitalizations and stay at [facility] for COVID and sepsis since January.  Pt lives alone but daughter Tyrone Roman assists pt with care.  Pt has [facility] for nursing, PT and OT.  States he is not able to walk due to weakness and breathing.  States he can stand to transfer to wheelchair.  States he has a hospital bed but  the trapeze bar was not ordered which would help him transfer.  States he is wearing O2 at 2 l/min most of the time.  Pt states that his Spiriva inhaler is very expensive and he would like something that does not cost as much.  States he needs an RX for his Symbicort sent to his pharmacy as he is using the [facility] that he took home from the SNF.  States his temperature was up over the weekend and it was 99.5 today Nurse Case Manager Clinical Goal(s):  patient will report using inhalers as prescribed including rinsing mouth after use  patient will report utilizing pursed lip breathing for shortness of breath  patient will be able to verbalize understanding of COPD action plan and when to seek appropriate levels of medical care  patient will engage in lite exercise as tolerated to build/regain stamina and strength and reduce shortness of breath through activity tolerance   patient will verbalize basic understanding of COVID-19 impact on individual health and self health management as evidenced by verbalization of basic understanding of COVID-19 as a viral disease,  Interventions:  . Collaboration with [name], MD regarding development and update of comprehensive plan of care as evidenced by provider attestation and co-signature- pt has concerns for cost of Spiriva, needs RX for Symbicort and would like an order for  Trapeze bar to go on his hospital bed . Inter-disciplinary care team collaboration (see longitudinal plan of care)  Provided patient with basic written and verbal COPD education on self care/management/and exacerbation prevention   Provided patient with COPD action plan and reinforced importance of daily self assessment  Provided written and verbal instructions on pursed lip breathing and utilized returned demonstration as teach back  Provided  instruction on COVID-19 impact on individual health and self health management as evidenced by verbalization of basic understanding of  COVID-19 as a viral disease,   Collaborated with CCM pharmacist about concerns with cost of inhalers   Patient Goals/Self-Care Activities - Take medications as prescribed including inhalers - Practice and use pursed lip breathing for shortness of breath recovery and prevention - Self assess COPD action plan zone and make appointment with provider if you have been in the yellow zone for 48 hours without   improvement. - Engage in lite exercise as tolerated 3-5 days a week - Utilize infection prevention strategies to reduce risk of respiratory infection   - develop a rescue plan - follow rescue plan if symptoms flare-up - keep follow-up appointments Follow Up Plan: Telephone follow up appointment with care management team member scheduled for: 06/09/20 at 10 AM The patient has been provided with contact information for the care management team and has been advised to call with any health related questions  or concerns.       Plan:Telephone follow up appointment with care management team member scheduled for:  06/09/20 and The patient has been provided with contact information for the care management team and has been advised to call with any health related questions or concerns.  Peter Garter RN, Jackquline Denmark, CDE Care Management Coordinator [facility] [phone], Mobile [phone]

## 2020-05-24 NOTE — Telephone Encounter (Signed)
Called and spoke with pt letting him know the info stated by Dr. Loanne Drilling and he verbalized understanding. Pt said that he did need samples of Spiriva so I have put samples up front for pt. Pt said he was going to contact his daughter for her to come by and get the samples for him. I did put a sticky on the samples stating that pt needs to have appt scheduled. Nothing further needed.

## 2020-05-24 NOTE — Chronic Care Management (AMB) (Signed)
Chronic Care Management    Clinical Social Work Note  05/24/2020 Name: Tyrone Roman MRN: [id number] DOB: Jan 17, 1946  Tyrone Roman is a 75 y.o. year old male who is a primary care patient of [name], MD. The CCM team was consulted to assist the patient with chronic disease management and/or care coordination needs related to: Level of Care Concerns with regards to Essential Hypertension, Coronary Artery Disease, Chronic Obstructive Pulmonary Disease, Acute Respiratory Failure and Stage III Chronic Kidney Disease.   Engaged with patient by telephone for initial visit in response to provider referral for social work chronic care management and care coordination services.  LCSW received a request from Peter Garter, Embedded RNCM with [facility], to assist patient with applying for Aid and Attendance Benefits through [employer].  Patient denied having an interest in applying for services, stating, "I'll be dead before I am ever approved".  Patient also denied having an interest in applying for Adult Medicaid to see if he would be eligible for CAPS Forensic scientist) and/or PCS ([place]), indicating that he well-exceeds the income guidelines and has too many assets.  LCSW offered to provide counseling and supportive services, as well as caregiver resources, but again, patient declined.  Patient was not receptive to any services offered by LCSW at this time, reporting that he has a Palliative Care Consult scheduled for 06/03/2020 at 9:00am, with Jari Favre from [employer], at which time he plans to also discuss [employer].  Patient was given direct contact information for LCSW and encouraged to contact LCSW directly if he changes his mind about wanting to receive services.  Consent to Services:  The patient was given information about Chronic Care Management services, agreed to services, and gave  verbal consent prior to initiation of services.  Please see initial visit note for detailed documentation.   Patient agreed to services and consent obtained.   Assessment: Review of patient past medical history, allergies, medications, and health status, including review of relevant consultants reports was performed today as part of a comprehensive evaluation and provision of chronic care management and care coordination services.     SDOH (Social Determinants of Health) assessments and interventions performed:    Advanced Directives Status: See Care Plan for related entries.  CCM Care Plan  Allergies  Allergen Reactions  . Cephalexin Shortness Of Breath, Rash and Other (See Comments)    Tolerated Augmentin 2015 and 2017 (12-2017). Tolerated nafcillin 12/2017 PATIENT HAS HAD A PCN REACTION WITH IMMEDIATE RASH, FACIAL/TONGUE/THROAT SWELLING, SOB, OR LIGHTHEADEDNESS WITH HYPOTENSION:  #  #  YES  #  #  Has patient had a PCN reaction causing severe rash involving mucus membranes or skin necrosis: No Has patient had a PCN reaction that required hospitalization: No Has patient had a PCN reaction occurring within the last 10 years: No If all of the above answers are "NO", then may proceed  . Clarithromycin Shortness Of Breath and Rash  . Certolizumab Pegol Hives  . Tocilizumab Hives  . Doxycycline Rash  . Hydroxyzine Rash  . Lidoderm Other (See Comments)    "made me act weird"  . Pyrithione Zinc Rash    Outpatient Encounter Medications as of 05/24/2020  Medication Sig  . acetaminophen (TYLENOL) 325 MG tablet Take 650 mg by mouth every 6 (six) hours as needed for headache, mild pain or fever.  Marland Kitchen albuterol (VENTOLIN HFA) 108 (90 Base) MCG/ACT inhaler Inhale 2 puffs into the  lungs every 4 (four) hours as needed for wheezing or shortness of breath.  . APIXABAN (ELIQUIS) VTE STARTER PACK (10MG  AND 5MG ) Take as directed on package: start with two-5mg  tablets twice daily for 7 days. On day 8,  switch to one-5mg  tablet twice daily.  . budesonide-formoterol (SYMBICORT) 160-4.5 MCG/ACT inhaler Inhale 2 puffs into the lungs in the morning and at bedtime.  . lactose free nutrition (BOOST PLUS) LIQD Take 237 mLs by mouth 3 (three) times daily with meals.  . melatonin 5 MG TABS Take 5 mg by mouth at bedtime.  . Menthol (ICY HOT) 5 % PTCH Apply 1 patch topically 2 (two) times daily. Apply to lower back.  . metoprolol tartrate (LOPRESSOR) 25 MG tablet Take 1 tablet (25 mg total) by mouth 2 (two) times daily.  Marland Kitchen omeprazole (PRILOSEC) 40 MG capsule Take 1 capsule (40 mg total) by mouth daily.  . predniSONE (DELTASONE) 10 MG tablet Take 3 tablets (30 mg total) by mouth every 12 (twelve) hours.  . simvastatin (ZOCOR) 20 MG tablet Take 1 tablet (20 mg total) by mouth daily.  . tamsulosin (FLOMAX) 0.4 MG CAPS capsule Take 1 capsule (0.4 mg total) by mouth daily.  . Tiotropium Bromide Monohydrate (SPIRIVA RESPIMAT) 2.5 MCG/ACT AERS Inhale 2 puffs into the lungs daily.   No facility-administered encounter medications on file as of 05/24/2020.    Patient Active Problem List   Diagnosis Date Noted  . Weakness of both lower extremities   . FUO (fever of unknown origin)   . Sepsis due to pneumonia ([place]) 05/09/2020  . Post-COVID-19 syndrome manifesting as chronic cough 05/09/2020  . COPD without exacerbation ([place]) 05/09/2020  . Essential hypertension 04/26/2020  . Acute hypoxemic respiratory failure due to COVID-19 ([place]) 04/03/2020  . Acute respiratory failure with hypoxia ([place]) 03/28/2020  . Physical deconditioning 03/26/2020  . Normocytic anemia 03/26/2020  . Severe sepsis ([place]) 03/25/2020  . Multifocal pneumonia 03/17/2020  . COPD with acute exacerbation ([place]) 03/17/2020  . Hypoxia 03/17/2020  . CAD (coronary artery disease) 03/17/2020  . Thrombocytopenia ([place]) 03/08/2020  . Stage 3b chronic kidney disease ([place]) 03/08/2020  . Pneumonia due to COVID-19 virus 02/29/2020  . Acute respiratory  failure due to COVID-19 ([place]) 02/29/2020  . Closed nondisplaced fracture of shaft of third metacarpal bone of left hand 03/04/2019  . Wound infection after surgery 01/01/2018  . Medication monitoring encounter 01/01/2018  . Sepsis ([place]) 12/10/2017  . HNP (herniated nucleus pulposus), lumbar 10/07/2017  . Lumbar herniated disc 10/07/2017  . Psoriasis of scalp 03/27/2016  . Rheumatoid arthritis ([place]) 03/25/2013  . GI bleeding 02/20/2012  . Dyspnea on exertion 09/08/2010  . COPD (chronic obstructive pulmonary disease) ([place]) 08/04/2010  . Bruising 08/04/2010  . TINNITUS 12/16/2009  . Dizziness and giddiness 12/16/2009  . NECK SPRAIN AND STRAIN 10/21/2009  . LUMBAR SPRAIN AND STRAIN 10/21/2009  . Hyperlipidemia 06/10/2009  . Coronary artery disease of native artery of native heart with stable angina pectoris ([place]) 06/10/2009  . GERD 06/10/2009  . BARRETTS ESOPHAGUS 06/10/2009  . CONCUSSION WITH LOC OF 30 MINUTES OR LESS 02/28/2009  . NEPHROLITHIASIS 07/01/2007  . CONTACT DERMATITIS 12/12/2006  . BPH (benign prostatic hyperplasia) 11/04/2006  . COLONIC POLYPS 10/21/2003  . DIVERTICULOSIS, COLON 10/21/2003    Conditions to be addressed/monitored: Essential Hypertension, Coronary Artery Disease, Chronic Obstructive Pulmonary Disease, Acute Respiratory Failure and Stage III Chronic Kidney Disease. Patient denied the need for social work involvement at this time.  Follow Up Plan: Client will  contact LCSW directly if he changes his mind about wanting to receive services.  LCSW contact information provided to patient, prior to terminating the call.      Nat Christen LCSW Licensed Clinical Social Worker [place] [phone]

## 2020-05-24 NOTE — Telephone Encounter (Signed)
FYI Spoke with pt daughter who is listed on pt DPR, advised to schedule pt an office appointment to see Dr Sarajane Jews, state that she can bring pt to the office on Friday. Pt scheduled for Friday at 11 am

## 2020-05-24 NOTE — Telephone Encounter (Signed)
Called and spoke with pt about the spiriva.  He stated that this is going to cost him $400 for the spiriva.  He is wanting to know if this can be changed to a different medication.  JE please advise. Thanks

## 2020-05-24 NOTE — Patient Instructions (Signed)
Visit Information   PATIENT GOALS:  Goals Addressed              This Visit's Progress   .  [place] My Quality of Life-stand and walk again (pt-stated)   On track     Timeframe:  Long-Range Goal Priority:  Medium Start Date:      05/24/20                       Expected End Date:     11/23/20                  Follow Up Date 06/09/20    - check out options for in-home help, long-term care or hospice - discuss my treatment options with the doctor or nurse - do one enjoyable thing every day  -do exercises that PT recommends   Why is this important?    Having a long-term illness can be scary.   It can also be stressful for you and your caregiver.   These steps may help.    Notes:     .  Track and Manage My Blood Pressure-Hypertension/Hypotension   On track     Timeframe:  Long-Range Goal Priority:  Medium Start Date:   05/24/20                          Expected End Date:      11/23/20                 Follow Up Date 06/09/20    - check blood pressure 3 times per week - choose a place to take my blood pressure (home, clinic or office, retail store) - write blood pressure results in a log or diary  -obtain a blood pressure monitor   Why is this important?    You won't feel high blood pressure, but it can still hurt your blood vessels.   High blood pressure can cause heart or kidney problems. It can also cause a stroke.   Making lifestyle changes like losing a little weight or eating less salt will help.   Checking your blood pressure at home and at different times of the day can help to control blood pressure.   If the doctor prescribes medicine remember to take it the way the doctor ordered.   Call the office if you cannot afford the medicine or if there are questions about it.     Notes:     .  Track and Manage My Symptoms-COPD/post COVID   On track     Timeframe:  Long-Range Goal Priority:  High Start Date:      05/24/20                       Expected End  Date:     11/23/20                  Follow Up Date 06/09/20    - develop a rescue plan - follow rescue plan if symptoms flare-up - keep follow-up appointments   -Take medications as prescribed including inhalers - Practice and use pursed lip breathing for shortness of breath recovery and prevention - Self assess COPD action plan zone and make appointment with provider if you have been in the yellow zone for 48 hours without   improvement. - Engage in lite exercise as tolerated 3-5 days a week - Utilize infection  prevention strategies to reduce risk of respiratory infection  Why is this important?    Tracking your symptoms and other information about your health helps your doctor plan your care.   Write down the symptoms, the time of day, what you were doing and what medicine you are taking.   You will soon learn how to manage your symptoms.     Notes:       COPD Action Plan A COPD action plan is a description of what to do when you have a flare (exacerbation) of chronic obstructive pulmonary disease (COPD). Your action plan is a color-coded plan that lists the symptoms that indicate whether your condition is under control and what actions to take.  If you have symptoms in the green zone, it means you are doing well that day.  If you have symptoms in the yellow zone, it means you are having a bad day or an exacerbation.  If you have symptoms in the red zone, you need urgent medical care. Follow the plan that you and your health care provider developed. Review your plan with your health care provider at each visit. Red zone Symptoms in this zone mean that you should get medical help right away. They include:  Feeling very short of breath, even when you are resting.  Not being able to do any activities because of poor breathing.  Not being able to sleep because of poor breathing.  Fever or shaking chills.  Feeling confused or very sleepy.  Chest pain.  Coughing up  blood. If you have any of these symptoms, call emergency services (911 in the [place] or go to the nearest emergency room.   Yellow zone Symptoms in this zone mean that your condition may be getting worse. They include:  Feeling more short of breath than usual.  Having less energy for daily activities than usual.  Phlegm or mucus that is thicker than usual.  Needing to use your rescue inhaler or nebulizer more often than usual.  More ankle swelling than usual.  Coughing more than usual.  Feeling like you have a chest cold.  Trouble sleeping due to COPD symptoms.  Decreased appetite.  COPD medicines not helping as much as usual. If you experience any "yellow" symptoms:  Keep taking your daily medicines as directed.  Use your quick-relief inhaler as told by your health care provider.  If you were prescribed steroid medicine to take by mouth (oral medicine), start taking it as told by your health care provider.  If you were prescribed an antibiotic medicine, start taking it as told by your health care provider. Do not stop taking the antibiotic even if you start to feel better.  Use oxygen as told by your health care provider.  Get more rest.  Do your pursed-lip breathing exercises.  Do not smoke. Avoid any irritants in the air. If your signs and symptoms do not improve after taking these steps, call your health care provider right away.   Green zone Symptoms in this zone mean that you are doing well. They include:  Being able to do your usual activities and exercise.  Having the usual amount of coughing, including the same amount of phlegm or mucus.  Being able to sleep well.  Having a good appetite.   Where to find more information: You can find more information about COPD from:  American Lung Association, My COPD Action Plan: www.lung.org  COPD Foundation: www.copdfoundation.[place]: https://wilson-eaton.com/ Follow these  instructions at home:  Continue taking your daily medicines as told by your health care provider.  Make sure you receive all the immunizations that your health care provider recommends, especially the pneumococcal and influenza vaccines.  Wash your hands often with soap and water. Have family members wash their hands too. Regular hand washing can help prevent infections.  Follow your usual exercise and diet plan.  Avoid irritants in the air, such as smoke.  Do not use any products that contain nicotine or tobacco. These products include cigarettes, chewing tobacco, and vaping devices, such as e-cigarettes. If you need help quitting, ask your health care provider. Summary  A COPD action plan tells you what to do when you have a flare (exacerbation) of chronic obstructive pulmonary disease (COPD).  Follow each action plan for your symptoms. If you have any symptoms in the red zone, call emergency services (911 in the [place] or go to the nearest emergency room. This information is not intended to replace advice given to you by your health care provider. Make sure you discuss any questions you have with your health care provider. Document Revised: 12/01/2019 Document Reviewed: 12/01/2019 Elsevier Patient Education  2021 [place].   Consent to CCM Services: Tyrone Roman was given information about Chronic Care Management services today including:  1. CCM service includes personalized support from designated clinical staff supervised by his physician, including individualized plan of care and coordination with other care providers 2. 24/7 contact phone numbers for assistance for urgent and routine care needs. 3. Service will only be billed when office clinical staff spend 20 minutes or more in a month to coordinate care. 4. Only one practitioner may furnish and bill the service in a calendar month. 5. The patient may stop CCM services at any time (effective at the end of the month) by phone  call to the office staff. 6. The patient will be responsible for cost sharing (co-pay) of up to 20% of the service fee (after annual deductible is met).  Patient agreed to services and verbal consent obtained.   Patient verbalizes understanding of instructions provided today and agrees to view in [place].   Telephone follow up appointment with care management team member scheduled for: 06/09/20 at 59 AM  [place], [facility], CDE Care Management Coordinator [facility] [phone], Mobile [phone]  CLINICAL CARE PLAN: Patient Care Plan: RNCM: Cardiovascular Disease- HTN/hypotension, HLD, CAD    Problem Identified: Lack of long term management of Cardiovascular disease   Priority: Medium    Long-Range Goal: Effective long term self management of Cardiovascular disease(HTN/hypotension, CAD, HLD)   Start Date: 05/24/2020  Expected End Date: 11/23/2020  This Visit's Progress: On track  Priority: Medium  Note:   Current Barriers:  Marland Kitchen Knowledge deficits related to self health management of cardiovascular disease:HTN/hypotension,CAD and HLD . Knowledge Deficits related to HTN/hypotension, CAD and HLD . Chronic Disease Management support and education needs related to HTN/hypotension, CAD and HLD . Difficulty obtaining medications  . Unable to perform ADLs independently . Unable to perform IADLs independently . Pt has had 4 hospitalizations and stay at [facility] for COVID and sepsis since January.  Pt lives alone but daughter Tyrone Roman assists pt with care.  Pt has [place] for nursing, PT and OT.  Reports his B/P has been going up and down.  Reports some dizziness on standing when his B/P is lower.  Denies any falls since returning home Nurse Case Manager Clinical Goal(s):   patient will take  all medications exactly as prescribed and will call provider for medication related questions  patient will verbalize understanding of cardiovascular symptoms and when to call  doctor  patient will verbalize understanding of plan for HTN/hypotension, CAD and HLD  patient will work with primary care provider and CCM team to address needs related to HTN/hypotension, CAD and HLD  patient will attend all scheduled medical appointments: Dr. Sarajane Jews 05/30/20  patient will demonstrate improved adherence to prescribed treatment plan for HTN/hypotension, CAD and HLD as evidenced by improved B/P in range, adherence to medications and  reports  of improvement   patient will work with CM team pharmacist to assist with pharmacy assistance and adherence  patient will work with CM clinical social worker to get resources for possible VA benefits for personal care service and caregiver stress Interventions:  . Collaboration with [name], MD regarding development and update of comprehensive plan of care as evidenced by provider attestation and co-signature . Inter-disciplinary care team collaboration (see longitudinal plan of care) . Basic overview and discussion of HTN/hypotension, CAD and HLD . Medications reviewed . Evaluation of current treatment plan related to HTN/hypotension, CAD and HLD and patient's adherence to plan as established by provider. . Provided education to patient re: "HTN/hypotension, CAD and HLD . Reviewed medications with patient and discussed adherence and cost issues . Advised patient, providing education and rationale, to monitor blood pressure daily and record, calling provider for findings outside established parameters.  . Provided patient with verbal educational materials related to HTN/hypotension, CAD and HLD . Reviewed scheduled/upcoming provider appointments including:  . Social Work referral for resources for possible VA benefits for personal care service and caregiver stress . Pharmacy referral for pharmacy assistance pt is active with CCM pharmacist  . Discussed plans with patient for ongoing care management follow up and provided patient  with direct contact information for care management team Patient Goals/Self-Care Activities: -check blood pressure 3 times per week - choose a place to take my blood pressure (home, clinic or office, retail store) - write blood pressure results in a log or diary  -obtain a blood pressure monitor Follow Up Plan: Telephone follow up appointment with care management team member scheduled for: 06/09/20 at 10 AM The patient has been provided with contact information for the care management team and has been advised to call with any health related questions or concerns.      Patient Care Plan: RNCM: Post COVID/COPD    Problem Identified: Lack of long term self management plan of post COVID/COPD   Priority: High    Long-Range Goal: Long term effective self mangement of post COVID/COPD   Start Date: 05/24/2020  Expected End Date: 11/23/2020  This Visit's Progress: On track  Priority: High  Note:   Current Barriers:   Knowledge deficit related to basic COPD/post COVID self care/management  Knowledge deficit related to basic understanding of how to use inhalers and how inhaled medications work  Knowledge deficit related to importance of energy conservation  Unable to perform ADLs independently  Unable to perform IADLs independently   Pt has had 4 hospitalizations and stay at [facility] for COVID and sepsis since January.  Pt lives alone but daughter Tyrone Roman assists pt with care.  Pt has [place] for nursing, PT and OT.  States he is not able to walk due to weakness and breathing.  States he can stand to transfer to wheelchair.  States he has a hospital bed but the trapeze bar was not ordered  which would help him transfer.  States he is wearing O2 at 2 l/min most of the time.  Pt states that his Spiriva inhaler is very expensive and he would like something that does not cost as much.  States he needs an RX for his Symbicort sent to his pharmacy as he is using the [facility] that [facility] from the SNF.   States his temperature was up over the weekend and it was 99.5 today Nurse Case Manager Clinical Goal(s):  patient will report using inhalers as prescribed including rinsing mouth after use  patient will report utilizing pursed lip breathing for shortness of breath  patient will be able to verbalize understanding of COPD action plan and when to seek appropriate levels of medical care  patient will engage in lite exercise as tolerated to build/regain stamina and strength and reduce shortness of breath through activity tolerance   patient will verbalize basic understanding of COVID-19 impact on individual health and self health management as evidenced by verbalization of basic understanding of COVID-19 as a viral disease,  Interventions:  . Collaboration with [name], MD regarding development and update of comprehensive plan of care as evidenced by provider attestation and co-signature- pt has concerns for cost of Spiriva, needs RX for Symbicort and would like an order for  Trapeze bar to go on his hospital bed . Inter-disciplinary care team collaboration (see longitudinal plan of care)  Provided patient with basic written and verbal COPD education on self care/management/and exacerbation prevention   Provided patient with COPD action plan and reinforced importance of daily self assessment  Provided written and verbal instructions on pursed lip breathing and utilized returned demonstration as teach back  Provided  instruction on COVID-19 impact on individual health and self health management as evidenced by verbalization of basic understanding of COVID-19 as a viral disease,   Collaborated with CCM pharmacist about concerns with cost of inhalers   Patient Goals/Self-Care Activities - Take medications as prescribed including inhalers - Practice and use pursed lip breathing for shortness of breath recovery and prevention - Self assess COPD action plan zone and make appointment with  provider if you have been in the yellow zone for 48 hours without   improvement. - Engage in lite exercise as tolerated 3-5 days a week - Utilize infection prevention strategies to reduce risk of respiratory infection   - develop a rescue plan - follow rescue plan if symptoms flare-up - keep follow-up appointments Follow Up Plan: Telephone follow up appointment with care management team member scheduled for: 06/09/20 at 10 AM The patient has been provided with contact information for the care management team and has been advised to call with any health related questions or concerns.

## 2020-05-24 NOTE — Telephone Encounter (Signed)
Patient is returning phone call. Patient phone number is [phone].

## 2020-05-24 NOTE — Telephone Encounter (Signed)
Please okay the verbal orders. I wrote the order for the bedside commode. Yes he can take the Zolpidem. As for an alternative to Spiriva, please ask his Pulmonologist

## 2020-05-24 NOTE — Telephone Encounter (Signed)
Attempted to call patient. No answer. Staff, please call patient with the following information:  His spiriva is covered by his insurance however pharmacy has reported that he has not met his deductible. Once his deductible is met the inhaler will be $45/month. Please offer him to come by the office for samples of Spiriva 2.5 mcg TWO puffs daily in the interim however he will need to resolve this issue with his insurance. Other inhalers will likely be as expensive until he meets his deductible.  Also he was not scheduled for follow-up for some reason after our last appointment. Please schedule him for follow-up with NP or me when convenient for the patient.  Rodman Pickle, M.D. [facility] Pulmonary/Critical Care Medicine 05/24/2020 1:46 PM

## 2020-05-24 NOTE — Telephone Encounter (Signed)
Spoke with Beth from [facility]  advised that Dr. Sarajane Jews approved verbal orders, approved for pt to take continue taking Ambien an to contact his Pulmonologist for alternative for Spiriva.

## 2020-05-25 ENCOUNTER — Emergency Department (HOSPITAL_COMMUNITY): Payer: Medicare Other

## 2020-05-25 ENCOUNTER — Other Ambulatory Visit: Payer: Self-pay

## 2020-05-25 ENCOUNTER — Telehealth: Payer: Self-pay | Admitting: Pulmonary Disease

## 2020-05-25 ENCOUNTER — Inpatient Hospital Stay (HOSPITAL_COMMUNITY)
Admission: EM | Admit: 2020-05-25 | Discharge: 2020-06-02 | DRG: 871 | Disposition: A | Discharge status: Home Health | Payer: Medicare Other | Attending: Internal Medicine | Admitting: Internal Medicine

## 2020-05-25 DIAGNOSIS — Z888 Allergy status to other drugs, medicaments and biological substances status: Secondary | ICD-10-CM

## 2020-05-25 DIAGNOSIS — I2699 Other pulmonary embolism without acute cor pulmonale: Secondary | ICD-10-CM | POA: Diagnosis not present

## 2020-05-25 DIAGNOSIS — R6510 Systemic inflammatory response syndrome (SIRS) of non-infectious origin without acute organ dysfunction: Secondary | ICD-10-CM

## 2020-05-25 DIAGNOSIS — R21 Rash and other nonspecific skin eruption: Secondary | ICD-10-CM | POA: Diagnosis not present

## 2020-05-25 DIAGNOSIS — I2609 Other pulmonary embolism with acute cor pulmonale: Secondary | ICD-10-CM | POA: Diagnosis not present

## 2020-05-25 DIAGNOSIS — E872 Acidosis: Secondary | ICD-10-CM | POA: Diagnosis present

## 2020-05-25 DIAGNOSIS — J9601 Acute respiratory failure with hypoxia: Secondary | ICD-10-CM | POA: Diagnosis present

## 2020-05-25 DIAGNOSIS — I5032 Chronic diastolic (congestive) heart failure: Secondary | ICD-10-CM | POA: Diagnosis present

## 2020-05-25 DIAGNOSIS — E785 Hyperlipidemia, unspecified: Secondary | ICD-10-CM | POA: Diagnosis present

## 2020-05-25 DIAGNOSIS — N400 Enlarged prostate without lower urinary tract symptoms: Secondary | ICD-10-CM | POA: Diagnosis present

## 2020-05-25 DIAGNOSIS — T368X5A Adverse effect of other systemic antibiotics, initial encounter: Secondary | ICD-10-CM | POA: Diagnosis present

## 2020-05-25 DIAGNOSIS — R0689 Other abnormalities of breathing: Secondary | ICD-10-CM | POA: Diagnosis not present

## 2020-05-25 DIAGNOSIS — A4189 Other specified sepsis: Principal | ICD-10-CM | POA: Diagnosis present

## 2020-05-25 DIAGNOSIS — Z8249 Family history of ischemic heart disease and other diseases of the circulatory system: Secondary | ICD-10-CM

## 2020-05-25 DIAGNOSIS — M069 Rheumatoid arthritis, unspecified: Secondary | ICD-10-CM | POA: Diagnosis present

## 2020-05-25 DIAGNOSIS — J1282 Pneumonia due to coronavirus disease 2019: Secondary | ICD-10-CM | POA: Diagnosis not present

## 2020-05-25 DIAGNOSIS — I2510 Atherosclerotic heart disease of native coronary artery without angina pectoris: Secondary | ICD-10-CM | POA: Diagnosis present

## 2020-05-25 DIAGNOSIS — I700 Atherosclerosis of aorta: Secondary | ICD-10-CM | POA: Diagnosis not present

## 2020-05-25 DIAGNOSIS — J449 Chronic obstructive pulmonary disease, unspecified: Secondary | ICD-10-CM | POA: Diagnosis not present

## 2020-05-25 DIAGNOSIS — R0902 Hypoxemia: Secondary | ICD-10-CM | POA: Diagnosis not present

## 2020-05-25 DIAGNOSIS — M2550 Pain in unspecified joint: Secondary | ICD-10-CM | POA: Diagnosis not present

## 2020-05-25 DIAGNOSIS — J9621 Acute and chronic respiratory failure with hypoxia: Secondary | ICD-10-CM | POA: Diagnosis present

## 2020-05-25 DIAGNOSIS — T380X5A Adverse effect of glucocorticoids and synthetic analogues, initial encounter: Secondary | ICD-10-CM | POA: Diagnosis present

## 2020-05-25 DIAGNOSIS — D84821 Immunodeficiency due to drugs: Secondary | ICD-10-CM | POA: Diagnosis not present

## 2020-05-25 DIAGNOSIS — I82409 Acute embolism and thrombosis of unspecified deep veins of unspecified lower extremity: Secondary | ICD-10-CM | POA: Diagnosis not present

## 2020-05-25 DIAGNOSIS — Z7901 Long term (current) use of anticoagulants: Secondary | ICD-10-CM | POA: Diagnosis not present

## 2020-05-25 DIAGNOSIS — R778 Other specified abnormalities of plasma proteins: Secondary | ICD-10-CM

## 2020-05-25 DIAGNOSIS — E2740 Unspecified adrenocortical insufficiency: Secondary | ICD-10-CM | POA: Diagnosis not present

## 2020-05-25 DIAGNOSIS — J189 Pneumonia, unspecified organism: Secondary | ICD-10-CM | POA: Diagnosis not present

## 2020-05-25 DIAGNOSIS — R0602 Shortness of breath: Secondary | ICD-10-CM | POA: Diagnosis not present

## 2020-05-25 DIAGNOSIS — I110 Hypertensive heart disease with heart failure: Secondary | ICD-10-CM | POA: Diagnosis present

## 2020-05-25 DIAGNOSIS — Z8616 Personal history of COVID-19: Secondary | ICD-10-CM

## 2020-05-25 DIAGNOSIS — M7989 Other specified soft tissue disorders: Secondary | ICD-10-CM | POA: Diagnosis not present

## 2020-05-25 DIAGNOSIS — Z7401 Bed confinement status: Secondary | ICD-10-CM | POA: Diagnosis not present

## 2020-05-25 DIAGNOSIS — R079 Chest pain, unspecified: Secondary | ICD-10-CM | POA: Diagnosis not present

## 2020-05-25 DIAGNOSIS — E1165 Type 2 diabetes mellitus with hyperglycemia: Secondary | ICD-10-CM | POA: Diagnosis present

## 2020-05-25 DIAGNOSIS — I248 Other forms of acute ischemic heart disease: Secondary | ICD-10-CM | POA: Diagnosis present

## 2020-05-25 DIAGNOSIS — Z7952 Long term (current) use of systemic steroids: Secondary | ICD-10-CM | POA: Diagnosis not present

## 2020-05-25 DIAGNOSIS — Z86718 Personal history of other venous thrombosis and embolism: Secondary | ICD-10-CM

## 2020-05-25 DIAGNOSIS — R6520 Severe sepsis without septic shock: Secondary | ICD-10-CM | POA: Diagnosis not present

## 2020-05-25 DIAGNOSIS — J440 Chronic obstructive pulmonary disease with acute lower respiratory infection: Secondary | ICD-10-CM | POA: Diagnosis present

## 2020-05-25 DIAGNOSIS — G9340 Encephalopathy, unspecified: Secondary | ICD-10-CM | POA: Diagnosis not present

## 2020-05-25 DIAGNOSIS — I130 Hypertensive heart and chronic kidney disease with heart failure and stage 1 through stage 4 chronic kidney disease, or unspecified chronic kidney disease: Secondary | ICD-10-CM | POA: Diagnosis not present

## 2020-05-25 DIAGNOSIS — U071 COVID-19: Secondary | ICD-10-CM | POA: Diagnosis present

## 2020-05-25 DIAGNOSIS — E875 Hyperkalemia: Secondary | ICD-10-CM | POA: Diagnosis not present

## 2020-05-25 DIAGNOSIS — I2694 Multiple subsegmental pulmonary emboli without acute cor pulmonale: Secondary | ICD-10-CM

## 2020-05-25 DIAGNOSIS — Z955 Presence of coronary angioplasty implant and graft: Secondary | ICD-10-CM

## 2020-05-25 DIAGNOSIS — R918 Other nonspecific abnormal finding of lung field: Secondary | ICD-10-CM | POA: Diagnosis not present

## 2020-05-25 DIAGNOSIS — K219 Gastro-esophageal reflux disease without esophagitis: Secondary | ICD-10-CM | POA: Diagnosis present

## 2020-05-25 DIAGNOSIS — Z8673 Personal history of transient ischemic attack (TIA), and cerebral infarction without residual deficits: Secondary | ICD-10-CM

## 2020-05-25 DIAGNOSIS — J8410 Pulmonary fibrosis, unspecified: Secondary | ICD-10-CM | POA: Diagnosis not present

## 2020-05-25 DIAGNOSIS — R531 Weakness: Secondary | ICD-10-CM | POA: Diagnosis not present

## 2020-05-25 DIAGNOSIS — A419 Sepsis, unspecified organism: Secondary | ICD-10-CM

## 2020-05-25 DIAGNOSIS — R58 Hemorrhage, not elsewhere classified: Secondary | ICD-10-CM | POA: Diagnosis not present

## 2020-05-25 DIAGNOSIS — Z0184 Encounter for antibody response examination: Secondary | ICD-10-CM

## 2020-05-25 DIAGNOSIS — R0600 Dyspnea, unspecified: Secondary | ICD-10-CM | POA: Diagnosis not present

## 2020-05-25 DIAGNOSIS — J9690 Respiratory failure, unspecified, unspecified whether with hypoxia or hypercapnia: Secondary | ICD-10-CM | POA: Diagnosis not present

## 2020-05-25 DIAGNOSIS — E871 Hypo-osmolality and hyponatremia: Secondary | ICD-10-CM | POA: Diagnosis not present

## 2020-05-25 DIAGNOSIS — Z79899 Other long term (current) drug therapy: Secondary | ICD-10-CM

## 2020-05-25 DIAGNOSIS — Z833 Family history of diabetes mellitus: Secondary | ICD-10-CM

## 2020-05-25 DIAGNOSIS — U099 Post covid-19 condition, unspecified: Secondary | ICD-10-CM

## 2020-05-25 DIAGNOSIS — D849 Immunodeficiency, unspecified: Secondary | ICD-10-CM | POA: Diagnosis present

## 2020-05-25 DIAGNOSIS — Z7951 Long term (current) use of inhaled steroids: Secondary | ICD-10-CM

## 2020-05-25 DIAGNOSIS — Z87891 Personal history of nicotine dependence: Secondary | ICD-10-CM

## 2020-05-25 DIAGNOSIS — I517 Cardiomegaly: Secondary | ICD-10-CM | POA: Diagnosis not present

## 2020-05-25 DIAGNOSIS — Z881 Allergy status to other antibiotic agents status: Secondary | ICD-10-CM

## 2020-05-25 DIAGNOSIS — R609 Edema, unspecified: Secondary | ICD-10-CM | POA: Diagnosis not present

## 2020-05-25 DIAGNOSIS — I252 Old myocardial infarction: Secondary | ICD-10-CM

## 2020-05-25 DIAGNOSIS — M0579 Rheumatoid arthritis with rheumatoid factor of multiple sites without organ or systems involvement: Secondary | ICD-10-CM | POA: Diagnosis not present

## 2020-05-25 DIAGNOSIS — R509 Fever, unspecified: Secondary | ICD-10-CM | POA: Diagnosis not present

## 2020-05-25 LAB — RESP PANEL BY RT-PCR (FLU A&B, COVID) ARPGX2
Influenza A by PCR: NEGATIVE
Influenza B by PCR: NEGATIVE
SARS Coronavirus 2 by RT PCR: POSITIVE — AB

## 2020-05-25 LAB — PROTIME-INR
INR: 1.7 — ABNORMAL HIGH (ref 0.8–1.2)
Prothrombin Time: 19.8 seconds — ABNORMAL HIGH (ref 11.4–15.2)

## 2020-05-25 LAB — URINALYSIS, ROUTINE W REFLEX MICROSCOPIC
Bilirubin Urine: NEGATIVE
Glucose, UA: 50 mg/dL — AB
Hgb urine dipstick: NEGATIVE
Ketones, ur: NEGATIVE mg/dL
Leukocytes,Ua: NEGATIVE
Nitrite: NEGATIVE
Protein, ur: NEGATIVE mg/dL
Specific Gravity, Urine: 1.016 (ref 1.005–1.030)
pH: 7 (ref 5.0–8.0)

## 2020-05-25 LAB — COMPREHENSIVE METABOLIC PANEL
ALT: 27 U/L (ref 0–44)
AST: 29 U/L (ref 15–41)
Albumin: 2.3 g/dL — ABNORMAL LOW (ref 3.5–5.0)
Alkaline Phosphatase: 76 U/L (ref 38–126)
Anion gap: 10 (ref 5–15)
BUN: 21 mg/dL (ref 8–23)
CO2: 21 mmol/L — ABNORMAL LOW (ref 22–32)
Calcium: 9.5 mg/dL (ref 8.9–10.3)
Chloride: 98 mmol/L (ref 98–111)
Creatinine, Ser: 0.9 mg/dL (ref 0.61–1.24)
GFR, Estimated: >60 mL/min (ref >60)
Glucose, Bld: 225 mg/dL — ABNORMAL HIGH (ref 70–99)
Potassium: 4.6 mmol/L (ref 3.5–5.1)
Sodium: 129 mmol/L — ABNORMAL LOW (ref 135–145)
Total Bilirubin: 0.8 mg/dL (ref 0.3–1.2)
Total Protein: 5.3 g/dL — ABNORMAL LOW (ref 6.5–8.1)

## 2020-05-25 LAB — CBC WITH DIFFERENTIAL/PLATELET
Abs Immature Granulocytes: 0.23 10*3/uL — ABNORMAL HIGH (ref 0.00–0.07)
Basophils Absolute: 0 10*3/uL (ref 0.0–0.1)
Basophils Relative: 0 %
Eosinophils Absolute: 0 10*3/uL (ref 0.0–0.5)
Eosinophils Relative: 0 %
HCT: 36.6 % — ABNORMAL LOW (ref 39.0–52.0)
Hemoglobin: 11.5 g/dL — ABNORMAL LOW (ref 13.0–17.0)
Immature Granulocytes: 2 %
Lymphocytes Relative: 2 %
Lymphs Abs: 0.2 10*3/uL — ABNORMAL LOW (ref 0.7–4.0)
MCH: 28 pg (ref 26.0–34.0)
MCHC: 31.4 g/dL (ref 30.0–36.0)
MCV: 89.3 fL (ref 80.0–100.0)
Monocytes Absolute: 0.2 10*3/uL (ref 0.1–1.0)
Monocytes Relative: 2 %
Neutro Abs: 10.9 10*3/uL — ABNORMAL HIGH (ref 1.7–7.7)
Neutrophils Relative %: 94 %
Platelets: 155 10*3/uL (ref 150–400)
RBC: 4.1 MIL/uL — ABNORMAL LOW (ref 4.22–5.81)
RDW: 20.5 % — ABNORMAL HIGH (ref 11.5–15.5)
WBC: 11.6 10*3/uL — ABNORMAL HIGH (ref 4.0–10.5)
nRBC: 0 % (ref 0.0–0.2)

## 2020-05-25 LAB — I-STAT CHEM 8, ED
BUN: 21 mg/dL (ref 8–23)
Calcium, Ion: 1.2 mmol/L (ref 1.15–1.40)
Chloride: 99 mmol/L (ref 98–111)
Creatinine, Ser: 0.8 mg/dL (ref 0.61–1.24)
Glucose, Bld: 228 mg/dL — ABNORMAL HIGH (ref 70–99)
HCT: 36 % — ABNORMAL LOW (ref 39.0–52.0)
Hemoglobin: 12.2 g/dL — ABNORMAL LOW (ref 13.0–17.0)
Potassium: 4.7 mmol/L (ref 3.5–5.1)
Sodium: 128 mmol/L — ABNORMAL LOW (ref 135–145)
TCO2: 21 mmol/L — ABNORMAL LOW (ref 22–32)

## 2020-05-25 LAB — APTT: aPTT: 34 seconds (ref 24–36)

## 2020-05-25 LAB — TROPONIN I (HIGH SENSITIVITY)
Troponin I (High Sensitivity): 67 ng/L — ABNORMAL HIGH (ref <18)
Troponin I (High Sensitivity): 46 ng/L — ABNORMAL HIGH (ref <18)

## 2020-05-25 LAB — MAGNESIUM: Magnesium: 1.7 mg/dL (ref 1.7–2.4)

## 2020-05-25 LAB — LACTIC ACID, PLASMA
Lactic Acid, Venous: 3.1 mmol/L (ref 0.5–1.9)
Lactic Acid, Venous: 2.6 mmol/L (ref 0.5–1.9)

## 2020-05-25 LAB — LACTATE DEHYDROGENASE: LDH: 437 U/L — ABNORMAL HIGH (ref 98–192)

## 2020-05-25 MED ORDER — LACTATED RINGERS IV SOLN: INTRAVENOUS | Status: AC

## 2020-05-25 MED ORDER — SODIUM CHLORIDE 0.9 % IV SOLN: 2.0000 g | Freq: Three times a day (TID) | INTRAVENOUS | Status: DC

## 2020-05-25 MED ORDER — METRONIDAZOLE IN NACL 5-0.79 MG/ML-% IV SOLN
500.0000 mg | Freq: Three times a day (TID) | INTRAVENOUS | Status: DC
  Administered 2020-05-25 – 2020-05-26 (×2): 500 mg via INTRAVENOUS
  Filled 2020-05-25 (×2): qty 100

## 2020-05-25 MED ORDER — VANCOMYCIN HCL 2000 MG/400ML IV SOLN
2000.0000 mg | Freq: Once | INTRAVENOUS | Status: AC
  Administered 2020-05-25: 2000 mg via INTRAVENOUS
  Filled 2020-05-25: qty 400

## 2020-05-25 MED ORDER — VANCOMYCIN HCL 1250 MG/250ML IV SOLN
1250.0000 mg | Freq: Two times a day (BID) | INTRAVENOUS | Status: DC
  Administered 2020-05-26: 1250 mg via INTRAVENOUS
  Filled 2020-05-25: qty 250

## 2020-05-25 MED ORDER — PREDNISONE 10 MG PO TABS
30.0000 mg | ORAL_TABLET | Freq: Two times a day (BID) | ORAL | Status: DC
  Administered 2020-05-26: 30 mg via ORAL
  Filled 2020-05-25: qty 3

## 2020-05-25 MED ORDER — LACTATED RINGERS IV SOLN: INTRAVENOUS | Status: DC

## 2020-05-25 MED ORDER — ACETAMINOPHEN 325 MG PO TABS
650.0000 mg | ORAL_TABLET | Freq: Four times a day (QID) | ORAL | Status: DC | PRN
  Administered 2020-05-31 – 2020-06-01 (×2): 650 mg via ORAL
  Filled 2020-05-25 (×2): qty 2

## 2020-05-25 MED ORDER — SODIUM CHLORIDE 0.9 % IV SOLN
2.0000 g | Freq: Three times a day (TID) | INTRAVENOUS | Status: DC
  Administered 2020-05-26: 2 g via INTRAVENOUS
  Filled 2020-05-25 (×2): qty 2

## 2020-05-25 MED ORDER — IOHEXOL 350 MG/ML SOLN
50.0000 mL | Freq: Once | INTRAVENOUS | Status: AC | PRN
  Administered 2020-05-25: 50 mL via INTRAVENOUS

## 2020-05-25 MED ORDER — LACTATED RINGERS IV BOLUS
30.0000 mL/kg | Freq: Once | INTRAVENOUS | Status: AC
  Administered 2020-05-25: 2640 mL via INTRAVENOUS

## 2020-05-25 MED ORDER — SODIUM CHLORIDE 0.9 % IV SOLN
2.0000 g | Freq: Once | INTRAVENOUS | Status: AC
  Administered 2020-05-25: 2 g via INTRAVENOUS
  Filled 2020-05-25: qty 2

## 2020-05-25 MED ORDER — DEXAMETHASONE SODIUM PHOSPHATE 10 MG/ML IJ SOLN
10.0000 mg | Freq: Once | INTRAMUSCULAR | Status: AC
  Administered 2020-05-25: 10 mg via INTRAVENOUS
  Filled 2020-05-25: qty 1

## 2020-05-25 MED ORDER — SODIUM CHLORIDE 0.9 % IV SOLN: 2.0000 g | Freq: Once | INTRAVENOUS | Status: DC

## 2020-05-25 MED ORDER — HEPARIN (PORCINE) 25000 UT/250ML-% IV SOLN
1150.0000 [IU]/h | INTRAVENOUS | Status: DC
  Administered 2020-05-25: 1250 [IU]/h via INTRAVENOUS
  Administered 2020-05-26: 1050 [IU]/h via INTRAVENOUS
  Administered 2020-05-27: 1200 [IU]/h via INTRAVENOUS
  Administered 2020-05-28: 1050 [IU]/h via INTRAVENOUS
  Administered 2020-05-30: 1150 [IU]/h via INTRAVENOUS
  Filled 2020-05-25 (×6): qty 250

## 2020-05-25 MED ORDER — VANCOMYCIN HCL 1000 MG/200ML IV SOLN: 1000.0000 mg | Freq: Once | INTRAVENOUS | Status: DC

## 2020-05-25 MED ORDER — ACETAMINOPHEN 650 MG RE SUPP: 650.0000 mg | Freq: Four times a day (QID) | RECTAL | Status: DC | PRN

## 2020-05-25 NOTE — ED Provider Notes (Signed)
[facility] EMERGENCY DEPARTMENT Provider Note   CSN: [id number] Arrival date & time: 05/25/20  1535     History Chief Complaint  Patient presents with  . Code Sepsis    Tyrone Roman is a 75 y.o. male with past medical history of CAD, COPD, rheumatoid arthritis on chronic steroids, hyperlipidemia, hypertension, chronic hypoxic respiratory failure on 2 L after getting COVID 3 months ago.  He has had multiple hospitalizations for pneumonia has been in skilled nursing facility and is coming from home today with fever and acute on chronic hypoxia.  With was noted to be in the low 80s today on his normal 2 L.  He is currently requiring 5 L to maintain oxygen saturations above 90%.  Patient was recently hospitalized and discharged after a admission.  He was found to have bilateral DVTs worse on the left and is currently on Eliquis.  He has no other complaints at this time and states that he is feeling better after getting some more oxygen.  He was seen by infectious disease during his visit and thought to have some kind of prolonged inflammatory response to the COVID infection.  HPI     Past Medical History:  Diagnosis Date  . Allergy   . Barrett's esophageal ulceration   . BPH (benign prostatic hyperplasia)   . CAD (coronary artery disease)    sees Dr. Mar Daring  . Cataract    both eyes removed   . Colonic polyp   . Concussion with loss of consciousness of 30 minutes or less   . Contact dermatitis   . Contact with or exposure to venereal diseases   . COPD (chronic obstructive pulmonary disease) ([place])    sees Dr. Kara Mead   . Diverticulosis of colon   . Dysphagia   . GERD (gastroesophageal reflux disease)    past hx- on meds   . HNP (herniated nucleus pulposus), lumbar    recurrent  . Hyperlipidemia   . Hypertension   . Myocardial infarction [facility])    2011- stent placed  . Nephrolithiasis   . Rheumatoid arthritis [facility])    sees Dr. Gavin Pound   . SOB  (shortness of breath)     Patient Active Problem List   Diagnosis Date Noted  . Acute hypoxemic respiratory failure ([place]) 05/25/2020  . Weakness of both lower extremities   . FUO (fever of unknown origin)   . Sepsis due to pneumonia ([place]) 05/09/2020  . Post-COVID-19 syndrome manifesting as chronic cough 05/09/2020  . COPD without exacerbation ([place]) 05/09/2020  . Essential hypertension 04/26/2020  . Acute hypoxemic respiratory failure due to COVID-19 ([place]) 04/03/2020  . Acute respiratory failure with hypoxia ([place]) 03/28/2020  . Physical deconditioning 03/26/2020  . Normocytic anemia 03/26/2020  . Severe sepsis ([place]) 03/25/2020  . Multifocal pneumonia 03/17/2020  . COPD with acute exacerbation ([place]) 03/17/2020  . Hypoxia 03/17/2020  . CAD (coronary artery disease) 03/17/2020  . Thrombocytopenia ([place]) 03/08/2020  . Stage 3b chronic kidney disease ([place]) 03/08/2020  . Pneumonia due to COVID-19 virus 02/29/2020  . Acute respiratory failure due to COVID-19 ([place]) 02/29/2020  . Closed nondisplaced fracture of shaft of third metacarpal bone of left hand 03/04/2019  . Wound infection after surgery 01/01/2018  . Medication monitoring encounter 01/01/2018  . Sepsis ([place]) 12/10/2017  . HNP (herniated nucleus pulposus), lumbar 10/07/2017  . Lumbar herniated disc 10/07/2017  . Psoriasis of scalp 03/27/2016  . Rheumatoid arthritis ([place]) 03/25/2013  . GI bleeding  02/20/2012  . Dyspnea on exertion 09/08/2010  . COPD (chronic obstructive pulmonary disease) ([place]) 08/04/2010  . Bruising 08/04/2010  . TINNITUS 12/16/2009  . Dizziness and giddiness 12/16/2009  . NECK SPRAIN AND STRAIN 10/21/2009  . LUMBAR SPRAIN AND STRAIN 10/21/2009  . Hyperlipidemia 06/10/2009  . Coronary artery disease of native artery of native heart with stable angina pectoris ([place]) 06/10/2009  . GERD 06/10/2009  . BARRETTS ESOPHAGUS 06/10/2009  . CONCUSSION WITH LOC OF 30 MINUTES OR LESS 02/28/2009  . NEPHROLITHIASIS  07/01/2007  . CONTACT DERMATITIS 12/12/2006  . BPH (benign prostatic hyperplasia) 11/04/2006  . COLONIC POLYPS 10/21/2003  . DIVERTICULOSIS, COLON 10/21/2003    Past Surgical History:  Procedure Laterality Date  . BACK SURGERY    . CARDIAC CATHETERIZATION N/A 01/19/2016   Procedure: Left Heart Cath and Coronary Angiography;  Surgeon: Burnell Blanks, MD;  Location: [place] CV LAB;  Service: Cardiovascular;  Laterality: N/A;  . COLONOSCOPY  12/09/2014   per Dr. Silverio Decamp, adenomatous polyps, repeat 3 years   . CORONARY ANGIOPLASTY WITH STENT PLACEMENT    . ESOPHAGOGASTRODUODENOSCOPY (EGD) WITH ESOPHAGEAL DILATION  02/17/09   Barretts esophagus   . EYE SURGERY    . KNEE ARTHROSCOPY Left X 2  . LUMBAR LAMINECTOMY/DECOMPRESSION MICRODISCECTOMY Right 10/07/2017   Procedure: RIGHT LUMBAR ONE-TWO LAMINECTOMY WITH MICRODISCECTOMY;  Surgeon: Consuella Lose, MD;  Location: [facility];  Service: Neurosurgery;  Laterality: Right;  . LUMBAR LAMINECTOMY/DECOMPRESSION MICRODISCECTOMY N/A 12/06/2017   Procedure: RECURRENT MICRODISCECTOMY LUMBAR ONE - LUMBAR TWO;  Surgeon: Consuella Lose, MD;  Location: [facility];  Service: Neurosurgery;  Laterality: N/A;  . LUMBAR WOUND DEBRIDEMENT N/A 12/11/2017   Procedure: LUMBAR WOUND DEBRIDEMENT;  Surgeon: Consuella Lose, MD;  Location: [place];  Service: Neurosurgery;  Laterality: N/A;  . POLYPECTOMY    . TONSILLECTOMY    . UPPER GASTROINTESTINAL ENDOSCOPY         Family History  Problem Relation Age of Onset  . Heart attack Father        cardiovascular disorder  . Arthritis Father        family hx  . Colon cancer Father        mets  . Prostate cancer Father        1st degree relative  . Arthritis Mother   . Colon polyps Mother   . Melanoma Mother   . Dementia Mother   . Diabetes Paternal Uncle   . Colon cancer Paternal Uncle   . Stomach cancer Paternal Uncle   . Melanoma Paternal Uncle   . Cancer Maternal Grandmother   . Skin cancer  Daughter   . Colon polyps Sister   . Esophageal cancer Neg Hx   . Rectal cancer Neg Hx     Social History   Tobacco Use  . Smoking status: Former Smoker    Packs/day: 1.00    Years: 20.00    Pack years: 20.00    Types: Cigarettes    Quit date: 10/12/1988    Years since quitting: 31.6  . Smokeless tobacco: Never Used  . Tobacco comment: 1960's   Vaping Use  . Vaping Use: Never used  Substance Use Topics  . Alcohol use: Not Currently    Alcohol/week: 0.0 standard drinks    Comment: rare  . Drug use: No    Home Medications Prior to Admission medications   Medication Sig Start Date End Date Taking? Authorizing Provider  acetaminophen (TYLENOL) 325 MG tablet Take 650 mg by mouth every 6 (  six) hours as needed for headache, mild pain or fever.    [provider]  albuterol (VENTOLIN HFA) 108 (90 Base) MCG/ACT inhaler Inhale 2 puffs into the lungs every 4 (four) hours as needed for wheezing or shortness of breath. 05/18/20   Ghimire, Henreitta Leber, MD  APIXABAN [facility] [place]) VTE STARTER PACK (10MG  AND 5MG ) Take as directed on package: start with two-5mg  tablets twice daily for 7 days. On day 8, switch to one-5mg  tablet twice daily. 05/18/20   Ghimire, Henreitta Leber, MD  budesonide-formoterol (SYMBICORT) 160-4.5 MCG/ACT inhaler Inhale 2 puffs into the lungs in the morning and at bedtime. 05/18/20 06/17/20  Ghimire, Henreitta Leber, MD  lactose free nutrition (BOOST PLUS) LIQD Take 237 mLs by mouth 3 (three) times daily with meals. 05/18/20 06/17/20  Ghimire, Henreitta Leber, MD  melatonin 5 MG TABS Take 5 mg by mouth at bedtime.    [provider]  Menthol (ICY HOT) 5 % PTCH Apply 1 patch topically 2 (two) times daily. Apply to lower back.    [provider]  metoprolol tartrate (LOPRESSOR) 25 MG tablet Take 1 tablet (25 mg total) by mouth 2 (two) times daily. 05/18/20   Ghimire, Henreitta Leber, MD  omeprazole (PRILOSEC) 40 MG capsule Take 1 capsule (40 mg total) by mouth daily. 05/18/20   Ghimire,  Henreitta Leber, MD  predniSONE (DELTASONE) 10 MG tablet Take 3 tablets (30 mg total) by mouth every 12 (twelve) hours. 05/18/20   Ghimire, Henreitta Leber, MD  simvastatin (ZOCOR) 20 MG tablet Take 1 tablet (20 mg total) by mouth daily. 05/18/20   Ghimire, Henreitta Leber, MD  tamsulosin (FLOMAX) 0.4 MG CAPS capsule Take 1 capsule (0.4 mg total) by mouth daily. 05/19/20   Ghimire, Henreitta Leber, MD  Tiotropium Bromide Monohydrate (SPIRIVA RESPIMAT) 2.5 MCG/ACT AERS Inhale 2 puffs into the lungs daily. 05/18/20   Ghimire, Henreitta Leber, MD  Tiotropium Bromide Monohydrate (SPIRIVA RESPIMAT) 2.5 MCG/ACT AERS Inhale 2 puffs into the lungs daily. 05/24/20   Margaretha Seeds, MD    Allergies    Cephalexin, Clarithromycin, Certolizumab pegol, Tocilizumab, Doxycycline, Hydroxyzine, Lidoderm, and Pyrithione zinc  Review of Systems   Review of Systems Ten systems reviewed and are negative for acute change, except as noted in the HPI.   Physical Exam Updated Vital Signs BP 101/75 (BP Location: Left Arm)   Pulse 79   Temp (!) 95.7 F (35.4 C) (Rectal)   Resp (!) 23   Ht 6\' 2"  (1.88 m)   Wt 88 kg Comment: Per 4/13 note  SpO2 90%   BMI 24.91 kg/m   Physical Exam Vitals and nursing note reviewed.  Constitutional:      General: He is not in acute distress.    Appearance: He is well-developed and normal weight. He is ill-appearing. He is not diaphoretic.     Interventions: Nasal cannula in place.  HENT:     Head: Normocephalic and atraumatic.  Eyes:     General: No scleral icterus.    Conjunctiva/sclera: Conjunctivae normal.  Cardiovascular:     Rate and Rhythm: Regular rhythm. Tachycardia present.     Heart sounds: Normal heart sounds.  Pulmonary:     Effort: Pulmonary effort is normal. No respiratory distress.     Breath sounds: Normal breath sounds.  Abdominal:     Palpations: Abdomen is soft.     Tenderness: There is no abdominal tenderness.  Musculoskeletal:     Cervical back: Normal range of motion and neck  supple.  Skin:    General: Skin is warm and dry.     Findings: Petechiae (over the legs) present.  Neurological:     Mental Status: He is alert.  Psychiatric:        Behavior: Behavior normal.     ED Results / Procedures / Treatments   Labs (all labs ordered are listed, but only abnormal results are displayed) Labs Reviewed  RESP PANEL BY RT-PCR (FLU A&B, COVID) ARPGX2 - Abnormal; Notable for the following components:      Result Value   SARS Coronavirus 2 by RT PCR POSITIVE (*)    All other components within normal limits  LACTIC ACID, PLASMA - Abnormal; Notable for the following components:   Lactic Acid, Venous 3.1 (*)    All other components within normal limits  COMPREHENSIVE METABOLIC PANEL - Abnormal; Notable for the following components:   Sodium 129 (*)    CO2 21 (*)    Glucose, Bld 225 (*)    Total Protein 5.3 (*)    Albumin 2.3 (*)    All other components within normal limits  CBC WITH DIFFERENTIAL/PLATELET - Abnormal; Notable for the following components:   WBC 11.6 (*)    RBC 4.10 (*)    Hemoglobin 11.5 (*)    HCT 36.6 (*)    RDW 20.5 (*)    Neutro Abs 10.9 (*)    Lymphs Abs 0.2 (*)    Abs Immature Granulocytes 0.23 (*)    All other components within normal limits  PROTIME-INR - Abnormal; Notable for the following components:   Prothrombin Time 19.8 (*)    INR 1.7 (*)    All other components within normal limits  URINALYSIS, ROUTINE W REFLEX MICROSCOPIC - Abnormal; Notable for the following components:   Glucose, UA 50 (*)    All other components within normal limits  I-STAT CHEM 8, ED - Abnormal; Notable for the following components:   Sodium 128 (*)    Glucose, Bld 228 (*)    TCO2 21 (*)    Hemoglobin 12.2 (*)    HCT 36.0 (*)    All other components within normal limits  CULTURE, BLOOD (ROUTINE X 2)  CULTURE, BLOOD (ROUTINE X 2)  URINE CULTURE  APTT  HEPARIN LEVEL (UNFRACTIONATED)  APTT  CBC  TROPONIN I (HIGH SENSITIVITY)     EKG  Radiology CT Angio Chest PE W and/or Wo Contrast  Result Date: 05/25/2020 CLINICAL DATA:  75 year old male with shortness of breath and chest pain. Concern for pulmonary embolism. EXAM: CT ANGIOGRAPHY CHEST WITH CONTRAST TECHNIQUE: Multidetector CT imaging of the chest was performed using the standard protocol during bolus administration of intravenous contrast. Multiplanar CT image reconstructions and MIPs were obtained to evaluate the vascular anatomy. CONTRAST:  60mL OMNIPAQUE IOHEXOL 350 MG/ML SOLN COMPARISON:  Chest CT dated 05/14/2020. FINDINGS: Cardiovascular: Mild cardiomegaly. No pericardial effusion. There is coronary vascular calcification. Mild atherosclerotic calcification of the thoracic aorta. Linear nonocclusive thrombus noted extending from the central right pulmonary artery into the lobar and segmental branches of the right lower lobe and right upper lobe. This is new since the prior CT. No CT evidence of right heart straining. Mediastinum/Nodes: Top-normal right hilar lymph nodes. The esophagus and the thyroid gland are grossly unremarkable. No mediastinal fluid collection. Lungs/Pleura: Bilateral patchy and streaky densities similar to prior CT. Calcified pleural plaque noted in the left upper lobe anteriorly. No pleural effusion pneumothorax. The central airways are patent. Upper Abdomen: No acute abnormality. Musculoskeletal: No  chest wall abnormality. No acute or significant osseous findings. Review of the MIP images confirms the above findings. IMPRESSION: 1. Pulmonary artery embolus extending from the central right pulmonary artery into the lobar and segmental branches of the right lower lobe and right upper lobe, new since the prior CT. No CT evidence of right heart straining. 2. Bilateral patchy and streaky densities similar to prior CT. 3. Aortic Atherosclerosis (ICD10-I70.0). These results were called by telephone at the time of interpretation on 05/25/2020 at 6:39 pm to  provider DAVID YAO , who verbally acknowledged these results. Electronically Signed   By: Anner Crete M.D.   On: 05/25/2020 18:43   DG Chest Port 1 View  Result Date: 05/25/2020 CLINICAL DATA:  75 year old male with concern for sepsis. EXAM: PORTABLE CHEST 1 VIEW COMPARISON:  Chest CT dated 05/14/2020. FINDINGS: Diffuse bilateral streaky interstitial densities worsened since 05/09/2020 but relatively similar to 05/10/2020 concerning for residual infiltrate, likely on the background of chronic changes. Clinical correlation and follow-up recommended. No lobar consolidation, pleural effusion, pneumothorax. The cardiac silhouette is within limits. No acute osseous pathology. IMPRESSION: Persistent bilateral infiltrates. Electronically Signed   By: Anner Crete M.D.   On: 05/25/2020 16:24    Procedures .Critical Care Performed by: Margarita Mail, PA-C Authorized by: Margarita Mail, PA-C   Critical care provider statement:    Critical care time (minutes):  70   Critical care time was exclusive of:  Separately billable procedures and treating other patients   Critical care was necessary to treat or prevent imminent or life-threatening deterioration of the following conditions:  Sepsis and respiratory failure   Critical care was time spent personally by me on the following activities:  Discussions with consultants, evaluation of patient's response to treatment, examination of patient, ordering and performing treatments and interventions, ordering and review of laboratory studies, ordering and review of radiographic studies, pulse oximetry, re-evaluation of patient's condition, obtaining history from patient or surrogate and review of old charts     Medications Ordered in ED Medications  lactated ringers infusion ( Intravenous New Bag/Given 05/25/20 1613)  ceFEPIme (MAXIPIME) 2 g in sodium chloride 0.9 % 100 mL IVPB (has no administration in time range)  vancomycin (VANCOREADY) IVPB 1250  mg/250 mL (has no administration in time range)  heparin ADULT infusion 100 units/mL (25000 units/254mL) (has no administration in time range)  dexamethasone (DECADRON) injection 10 mg (10 mg Intravenous Given 05/25/20 1616)  vancomycin (VANCOREADY) IVPB 2000 mg/400 mL (0 mg Intravenous Stopped 05/25/20 1918)  ceFEPIme (MAXIPIME) 2 g in sodium chloride 0.9 % 100 mL IVPB (0 g Intravenous Stopped 05/25/20 1647)  lactated ringers bolus 2,640 mL (0 mL/kg  88 kg Intravenous Stopped 05/25/20 1753)  iohexol (OMNIPAQUE) 350 MG/ML injection 50 mL (50 mLs Intravenous Contrast Given 05/25/20 1811)    ED Course  I have reviewed the triage vital signs and the nursing notes.  Pertinent labs & imaging results that were available during my care of the patient were reviewed by me and considered in my medical decision making (see chart for details).  Clinical Course as of 05/25/20 2059  Wed May 25, 2020  1747 Lactic Acid, Venous(!!): 3.1 [AH]  2057 Sodium(!): 128 [AH]    Clinical Course User Index [AH] Margarita Mail, PA-C   MDM Rules/Calculators/A&P                          CC: sob VS:  Vitals:   05/25/20 1745 05/25/20  1830 05/25/20 1920 05/25/20 2044  BP: 93/72 106/75 101/75   Pulse: 87 77 79   Resp: 20 (!) 27 (!) 23   Temp:   (!) 97.4 F (36.3 C) (!) 95.7 F (35.4 C)  TempSrc:   Oral Rectal  SpO2: (!) 87% 90% 90%   Weight:      Height:       .  CZ:YSAYTKZ is gathered by patient and ems, emr. Previous records obtained and reviewed. DDX:The patient's complaint of sob involves an extensive number of diagnostic and treatment options, and is a complaint that carries with it a high risk of complications, morbidity, and potential mortality. Given the large differential diagnosis, medical decision making is of high complexity. The emergent differential diagnosis for shortness of breath includes, but is not limited to, Pulmonary edema, bronchoconstriction, Pneumonia, Pulmonary embolism, Pneumotherax/  Hemothorax, Dysrythmia, ACS.  Labs: I ordered reviewed and interpreted labs which include CBC with elevated white blood cell count of 11.6, hemoglobin of 11.5 CMP with mild hyponatremia, elevated blood glucose, low protein levels COVID test is positive chronic since infection in January Troponin level pending. Lactic acid elevated at 3.1, repeat has not been drawn.   Imaging: I ordered and reviewed images which included portable 1 view chest x-ray and CT angiogram of the chest.. I independently visualized and interpreted all imaging. Significant findings include lateral infiltrates on plain film, new pulmonary emboli on CT angiogram. EKG:NSR at 93 , new t wave inversions Consults: Dr. Marlowe Sax for admission MDM: Patient here with acute on chronic hypoxic respiratory failure, reported fever at home, tachycardia, tachypnea, rectal temperature of 95.1, and hypotension meeting sepsis criteria.  Code sepsis called.  Patient had reduced received 30 mL/kg of LR's.  Started on heparin for pulmonary embolus.  He has been compliant with his Eliquis at home.  He continues to test positive for coronavirus.  Given the history of presumed chronic inflammatory response he was given steroids.  Patient will be admitted to the hospitalist service.  He is critically ill. Patient disposition:The patient appears reasonably stabilized for admission considering the current resources, flow, and capabilities available in the ED at this time, and I doubt any other [facility] requiring further screening and/or treatment in the ED prior to admission.        Final Clinical Impression(s) / ED Diagnoses Final diagnoses:  Sepsis, due to unspecified organism, unspecified whether acute organ dysfunction present [facility])  Multiple subsegmental pulmonary emboli without acute cor pulmonale [facility])    Rx / DC Orders ED Discharge Orders    None       Margarita Mail, PA-C 05/25/20 2344    Drenda Freeze, MD 05/26/20 1455

## 2020-05-25 NOTE — ED Notes (Signed)
Margarita Mail, PA notified of pt rectal temperature.

## 2020-05-25 NOTE — Progress Notes (Signed)
ANTICOAGULATION CONSULT NOTE - Initial Consult  Pharmacy Consult for heparin Indication: pulmonary embolus  Allergies  Allergen Reactions  . Cephalexin Shortness Of Breath, Rash and Other (See Comments)    Tolerated Augmentin 2015 and 2017 (12-2017). Tolerated nafcillin 12/2017 PATIENT HAS HAD A PCN REACTION WITH IMMEDIATE RASH, FACIAL/TONGUE/THROAT SWELLING, SOB, OR LIGHTHEADEDNESS WITH HYPOTENSION:  #  #  YES  #  #  Has patient had a PCN reaction causing severe rash involving mucus membranes or skin necrosis: No Has patient had a PCN reaction that required hospitalization: No Has patient had a PCN reaction occurring within the last 10 years: No If all of the above answers are "NO", then may proceed  . Clarithromycin Shortness Of Breath and Rash  . Certolizumab Pegol Hives  . Tocilizumab Hives  . Doxycycline Rash  . Hydroxyzine Rash  . Lidoderm Other (See Comments)    "made me act weird"  . Pyrithione Zinc Rash    Patient Measurements: Height: 6\' 2"  (188 cm) Weight: 88 kg (194 lb 0.1 oz) (Per 4/13 note) IBW/kg (Calculated) : 82.2 Heparin Dosing Weight: TBW  Vital Signs: Temp: 98.8 F (37.1 C) (04/20 1538) Temp Source: Oral (04/20 1538) BP: 106/75 (04/20 1830) Pulse Rate: 77 (04/20 1830)  Labs: Recent Labs    05/25/20 1547 05/25/20 1626  HGB 11.5* 12.2*  HCT 36.6* 36.0*  PLT 155  --   APTT 34  --   LABPROT 19.8*  --   INR 1.7*  --   CREATININE 0.90 0.80    Estimated Creatinine Clearance: 94.2 mL/min (by C-G formula based on SCr of 0.8 mg/dL).   Medical History: Past Medical History:  Diagnosis Date  . Allergy   . Barrett's esophageal ulceration   . BPH (benign prostatic hyperplasia)   . CAD (coronary artery disease)    sees Dr. Mar Daring  . Cataract    both eyes removed   . Colonic polyp   . Concussion with loss of consciousness of 30 minutes or less   . Contact dermatitis   . Contact with or exposure to venereal diseases   . COPD (chronic  obstructive pulmonary disease) ([place])    sees Dr. Kara Mead   . Diverticulosis of colon   . Dysphagia   . GERD (gastroesophageal reflux disease)    past hx- on meds   . HNP (herniated nucleus pulposus), lumbar    recurrent  . Hyperlipidemia   . Hypertension   . Myocardial infarction [facility])    2011- stent placed  . Nephrolithiasis   . Rheumatoid arthritis [facility])    sees Dr. Gavin Pound   . SOB (shortness of breath)    Assessment: 75 YOM presenting with worsening SOB and fever, previously admitted with new PE and discharged on Eliquis w/7d load, now on 5mg  BID.  Discussed with MD, will not bolus but will start gtt with concern for worsening PE, was supratherapeutic on 1400 units/hr and decreased to 1250 units/hr so will start there.    Goal of Therapy:  Heparin level 0.3-0.7 units/ml aPTT 66-102 seconds Monitor platelets by anticoagulation protocol: Yes   Plan:  Heparin gtt at 1250 units/hr, no bolus F/u 8 hour aPTT/HL F/u long term [employer] plan  Bertis Ruddy, PharmD Clinical Pharmacist ED Pharmacist Phone # [phone] 05/25/2020 7:10 PM

## 2020-05-25 NOTE — Telephone Encounter (Signed)
Called and spoke with pt and wife who stated that the spiriva inhaler costs too much and pt is unable to afford it. Stated to them to contact insurance company and obtain formulary list of covered meds so we could then discuss with Dr. Loanne Drilling. They verbalized understanding. Nothing further needed.

## 2020-05-25 NOTE — Sepsis Progress Note (Signed)
elink monitoring code sepsis.  

## 2020-05-25 NOTE — ED Notes (Signed)
Patient transported to CT 

## 2020-05-25 NOTE — H&P (Signed)
History and Physical    Tyrone Roman KKX:[id number] DOB: 11/13/1945 DOA: 05/25/2020  PCP: [name], MD Patient coming from: Home  Chief Complaint: Shortness of breath, fever  HPI: Tyrone Roman is a 75 y.o. male with medical history significant of rheumatoid arthritis on chronic steroids, CAD status post PCI, chronic diastolic heart failure, hypertension, hyperlipidemia, BPH, COPD, hospitalization for COVID-19 infection on 02/29/2020 status post remdesivir/steroids, multiple hospitalizations for pneumonia, recent admission for evaluation of tachycardia and was found to have PE/bilateral DVTs for which he was started on Eliquis and hospital course was complicated by persistent fever/FUO requiring extensive work-up including ID evaluation.  He presented to the ED today via EMS for evaluation of shortness of breath and fever for the last few days.  Per EMS, SPO2 84% on 2 L O2 and febrile with temperature 101 F.  Patient was given Tylenol and sats improved with 5 L supplemental oxygen.  In the ED, hypothermic with rectal temperature 95.7 F.  Slightly tachypneic.  Hypotensive with systolic as low as 16R.  Requiring 4 to 6 L supplemental oxygen.  Labs showing WBC 11.6, hemoglobin 11.5 (above baseline), platelet count 155K.  Sodium 129, potassium 4.6, chloride 98, bicarb 21, BUN 21, creatinine 0.9, glucose 225.  LFTs normal.  INR 1.7.  SARS-CoV-2 PCR test positive.  Influenza panel negative.  Lactic acid 3.1.  Blood culture x2 pending.  UA without signs of infection.  Urine culture pending.  EKG showing new diffuse T wave inversions.  Initial high-sensitivity troponin 67, repeat pending.  Chest x-ray showing persistent bilateral infiltrates.  CT angiogram chest showing PE extending from the central right pulmonary artery into the lobar and segmental branches of the right lower lobe and right upper lobe, new since prior CT.  No CT evidence of right heart strain.  Also showing bilateral patchy and streaky  densities similar to prior CT. Patient was given IV Decadron 10 mg, vancomycin, cefepime, and 30 cc/Kg fluid boluses per sepsis protocol.  Started on IV heparin.  Patient states he has continued to do poorly since he left the hospital.  He is very weak and needs constant support from family.  He has continued to have fevers as high as 103 F.  Since his last hospitalization he is using 2 to 3 L home oxygen but when he checks his oxygen saturation sometimes it is as low as 70s.  He feels short of breath but not coughing much.  Denies chest pain.  Denies nausea, vomiting, abdominal pain, diarrhea, or dysuria.  States he saw his rheumatologist after leaving the hospital who advised him to continue taking prednisone 30 mg twice daily.  He is fully vaccinated against COVID including booster shot.  He has not missed any doses of Eliquis.  Review of Systems:  All systems reviewed and apart from history of presenting illness, are negative.  Past Medical History:  Diagnosis Date  . Allergy   . Barrett's esophageal ulceration   . BPH (benign prostatic hyperplasia)   . CAD (coronary artery disease)    sees Dr. Mar Daring  . Cataract    both eyes removed   . Colonic polyp   . Concussion with loss of consciousness of 30 minutes or less   . Contact dermatitis   . Contact with or exposure to venereal diseases   . COPD (chronic obstructive pulmonary disease) ([place])    sees Dr. Kara Mead   . Diverticulosis of colon   . Dysphagia   .  GERD (gastroesophageal reflux disease)    past hx- on meds   . HNP (herniated nucleus pulposus), lumbar    recurrent  . Hyperlipidemia   . Hypertension   . Myocardial infarction [facility])    2011- stent placed  . Nephrolithiasis   . Rheumatoid arthritis [facility])    sees Dr. Gavin Pound   . SOB (shortness of breath)     Past Surgical History:  Procedure Laterality Date  . BACK SURGERY    . CARDIAC CATHETERIZATION N/A 01/19/2016   Procedure: Left Heart Cath and Coronary  Angiography;  Surgeon: Burnell Blanks, MD;  Location: [place] [facility];  Service: Cardiovascular;  Laterality: N/A;  . COLONOSCOPY  12/09/2014   per Dr. Silverio Decamp, adenomatous polyps, repeat 3 years   . CORONARY ANGIOPLASTY WITH STENT PLACEMENT    . ESOPHAGOGASTRODUODENOSCOPY (EGD) WITH ESOPHAGEAL DILATION  02/17/09   Barretts esophagus   . EYE SURGERY    . KNEE ARTHROSCOPY Left X 2  . LUMBAR LAMINECTOMY/DECOMPRESSION MICRODISCECTOMY Right 10/07/2017   Procedure: RIGHT LUMBAR ONE-TWO LAMINECTOMY WITH MICRODISCECTOMY;  Surgeon: Consuella Lose, MD;  Location: [place];  Service: Neurosurgery;  Laterality: Right;  . LUMBAR LAMINECTOMY/DECOMPRESSION MICRODISCECTOMY N/A 12/06/2017   Procedure: RECURRENT MICRODISCECTOMY LUMBAR ONE - LUMBAR TWO;  Surgeon: Consuella Lose, MD;  Location: [place];  Service: Neurosurgery;  Laterality: N/A;  . LUMBAR WOUND DEBRIDEMENT N/A 12/11/2017   Procedure: LUMBAR WOUND DEBRIDEMENT;  Surgeon: Consuella Lose, MD;  Location: [place];  Service: Neurosurgery;  Laterality: N/A;  . POLYPECTOMY    . TONSILLECTOMY    . UPPER GASTROINTESTINAL ENDOSCOPY       reports that he quit smoking about 31 years ago. His smoking use included cigarettes. He has a 20.00 pack-year smoking history. He has never used smokeless tobacco. He reports previous alcohol use. He reports that he does not use drugs.  Allergies  Allergen Reactions  . Cephalexin Shortness Of Breath, Rash and Other (See Comments)    Tolerated Augmentin 2015 and 2017 (12-2017). Tolerated nafcillin 12/2017 PATIENT HAS HAD A PCN REACTION WITH IMMEDIATE RASH, FACIAL/TONGUE/THROAT SWELLING, SOB, OR LIGHTHEADEDNESS WITH HYPOTENSION:  #  #  YES  #  #  Has patient had a PCN reaction causing severe rash involving mucus membranes or skin necrosis: No Has patient had a PCN reaction that required hospitalization: No Has patient had a PCN reaction occurring within the last 10 years: No If all of the above answers are  "NO", then may proceed  . Clarithromycin Shortness Of Breath and Rash  . Certolizumab Pegol Hives  . Tocilizumab Hives  . Doxycycline Rash  . Hydroxyzine Rash  . Lidoderm Other (See Comments)    "made me act weird"  . Pyrithione Zinc Rash    Family History  Problem Relation Age of Onset  . Heart attack Father        cardiovascular disorder  . Arthritis Father        family hx  . Colon cancer Father        mets  . Prostate cancer Father        1st degree relative  . Arthritis Mother   . Colon polyps Mother   . Melanoma Mother   . Dementia Mother   . Diabetes Paternal Uncle   . Colon cancer Paternal Uncle   . Stomach cancer Paternal Uncle   . Melanoma Paternal Uncle   . Cancer Maternal Grandmother   . Skin cancer Daughter   . Colon polyps Sister   .  Esophageal cancer Neg Hx   . Rectal cancer Neg Hx     Prior to Admission medications   Medication Sig Start Date End Date Taking? Authorizing Provider  acetaminophen (TYLENOL) 325 MG tablet Take 650 mg by mouth every 6 (six) hours as needed for headache, mild pain or fever.    [provider]  albuterol (VENTOLIN HFA) 108 (90 Base) MCG/ACT inhaler Inhale 2 puffs into the lungs every 4 (four) hours as needed for wheezing or shortness of breath. 05/18/20   Ghimire, Henreitta Leber, MD  APIXABAN Arne [place]) VTE STARTER PACK (10MG  AND 5MG ) Take as directed on package: start with two-5mg  tablets twice daily for 7 days. On day 8, switch to one-5mg  tablet twice daily. 05/18/20   Ghimire, Henreitta Leber, MD  budesonide-formoterol (SYMBICORT) 160-4.5 MCG/ACT inhaler Inhale 2 puffs into the lungs in the morning and at bedtime. 05/18/20 06/17/20  Ghimire, Henreitta Leber, MD  lactose free nutrition (BOOST PLUS) LIQD Take 237 mLs by mouth 3 (three) times daily with meals. 05/18/20 06/17/20  Ghimire, Henreitta Leber, MD  melatonin 5 MG TABS Take 5 mg by mouth at bedtime.    [provider]  Menthol (ICY HOT) 5 % PTCH Apply 1 patch topically 2 (two) times  daily. Apply to lower back.    [provider]  metoprolol tartrate (LOPRESSOR) 25 MG tablet Take 1 tablet (25 mg total) by mouth 2 (two) times daily. 05/18/20   Ghimire, Henreitta Leber, MD  omeprazole (PRILOSEC) 40 MG capsule Take 1 capsule (40 mg total) by mouth daily. 05/18/20   Ghimire, Henreitta Leber, MD  predniSONE (DELTASONE) 10 MG tablet Take 3 tablets (30 mg total) by mouth every 12 (twelve) hours. 05/18/20   Ghimire, Henreitta Leber, MD  simvastatin (ZOCOR) 20 MG tablet Take 1 tablet (20 mg total) by mouth daily. 05/18/20   Ghimire, Henreitta Leber, MD  tamsulosin (FLOMAX) 0.4 MG CAPS capsule Take 1 capsule (0.4 mg total) by mouth daily. 05/19/20   Ghimire, Henreitta Leber, MD  Tiotropium Bromide Monohydrate (SPIRIVA RESPIMAT) 2.5 MCG/ACT AERS Inhale 2 puffs into the lungs daily. 05/18/20   Ghimire, Henreitta Leber, MD  Tiotropium Bromide Monohydrate (SPIRIVA RESPIMAT) 2.5 MCG/ACT AERS Inhale 2 puffs into the lungs daily. 05/24/20   Margaretha Seeds, MD    Physical Exam: Vitals:   05/25/20 1920 05/25/20 2044 05/25/20 2140 05/25/20 2200  BP: 101/75  108/69 102/74  Pulse: 79  71 71  Resp: (!) 23  15 15   Temp: (!) 97.4 F (36.3 C) (!) 95.7 F (35.4 C) (!) 95.1 F (35.1 C)   TempSrc: Oral Rectal Rectal   SpO2: 90%  (!) 88% 97%  Weight:      Height:   6\' 2"  (1.88 m)     Physical Exam Constitutional:      General: He is not in acute distress. HENT:     Head: Normocephalic and atraumatic.  Eyes:     Extraocular Movements: Extraocular movements intact.     Conjunctiva/sclera: Conjunctivae normal.  Cardiovascular:     Rate and Rhythm: Normal rate and regular rhythm.     Pulses: Normal pulses.  Pulmonary:     Effort: Pulmonary effort is normal. No respiratory distress.     Breath sounds: No wheezing.     Comments: Coarse breath sounds bilaterally up to mid lung fields Satting in the mid 90s on 10 L HFNC Abdominal:     General: Bowel sounds are normal. There is no distension.  Palpations: Abdomen is  soft.     Tenderness: There is no abdominal tenderness.  Musculoskeletal:        General: No tenderness.     Cervical back: Normal range of motion and neck supple.     Left lower leg: Edema present.  Skin:    General: Skin is warm and dry.  Neurological:     General: No focal deficit present.     Mental Status: He is alert and oriented to person, place, and time.     Labs on Admission: I have personally reviewed following labs and imaging studies  CBC: Recent Labs  Lab 05/25/20 1547 05/25/20 1626  WBC 11.6*  --   NEUTROABS 10.9*  --   HGB 11.5* 12.2*  HCT 36.6* 36.0*  MCV 89.3  --   PLT 155  --    Basic Metabolic Panel: Recent Labs  Lab 05/25/20 1547 05/25/20 1626  NA 129* 128*  K 4.6 4.7  CL 98 99  CO2 21*  --   GLUCOSE 225* 228*  BUN 21 21  CREATININE 0.90 0.80  CALCIUM 9.5  --    GFR: Estimated Creatinine Clearance: 94.2 mL/min (by C-G formula based on SCr of 0.8 mg/dL). Liver Function Tests: Recent Labs  Lab 05/25/20 1547  AST 29  ALT 27  ALKPHOS 76  BILITOT 0.8  PROT 5.3*  ALBUMIN 2.3*   No results for input(s): LIPASE, AMYLASE in the last 168 hours. No results for input(s): AMMONIA in the last 168 hours. Coagulation Profile: Recent Labs  Lab 05/25/20 1547  INR 1.7*   Cardiac Enzymes: No results for input(s): CKTOTAL, CKMB, CKMBINDEX, TROPONINI in the last 168 hours. BNP (last 3 results) No results for input(s): PROBNP in the last 8760 hours. HbA1C: No results for input(s): HGBA1C in the last 72 hours. CBG: No results for input(s): GLUCAP in the last 168 hours. Lipid Profile: No results for input(s): CHOL, HDL, LDLCALC, TRIG, CHOLHDL, LDLDIRECT in the last 72 hours. Thyroid Function Tests: No results for input(s): TSH, T4TOTAL, FREET4, T3FREE, THYROIDAB in the last 72 hours. Anemia Panel: No results for input(s): VITAMINB12, FOLATE, FERRITIN, TIBC, IRON, RETICCTPCT in the last 72 hours. Urine analysis:    Component Value Date/Time    COLORURINE YELLOW 05/25/2020 1916   APPEARANCEUR CLEAR 05/25/2020 1916   LABSPEC 1.016 05/25/2020 1916   PHURINE 7.0 05/25/2020 1916   GLUCOSEU 50 (A) 05/25/2020 1916   HGBUR NEGATIVE 05/25/2020 1916   HGBUR negative 01/26/2010 0852   BILIRUBINUR NEGATIVE 05/25/2020 1916   BILIRUBINUR negative 08/24/2019 1520   KETONESUR NEGATIVE 05/25/2020 1916   PROTEINUR NEGATIVE 05/25/2020 1916   UROBILINOGEN 0.2 08/24/2019 1520   UROBILINOGEN 0.2 01/26/2010 0852   NITRITE NEGATIVE 05/25/2020 1916   LEUKOCYTESUR NEGATIVE 05/25/2020 1916    Radiological Exams on Admission: CT Angio Chest PE W and/or Wo Contrast  Result Date: 05/25/2020 CLINICAL DATA:  75 year old male with shortness of breath and chest pain. Concern for pulmonary embolism. EXAM: CT ANGIOGRAPHY CHEST WITH CONTRAST TECHNIQUE: Multidetector CT imaging of the chest was performed using the standard protocol during bolus administration of intravenous contrast. Multiplanar CT image reconstructions and MIPs were obtained to evaluate the vascular anatomy. CONTRAST:  6mL OMNIPAQUE IOHEXOL 350 MG/ML SOLN COMPARISON:  Chest CT dated 05/14/2020. FINDINGS: Cardiovascular: Mild cardiomegaly. No pericardial effusion. There is coronary vascular calcification. Mild atherosclerotic calcification of the thoracic aorta. Linear nonocclusive thrombus noted extending from the central right pulmonary artery into the lobar and segmental branches of  the right lower lobe and right upper lobe. This is new since the prior CT. No CT evidence of right heart straining. Mediastinum/Nodes: Top-normal right hilar lymph nodes. The esophagus and the thyroid gland are grossly unremarkable. No mediastinal fluid collection. Lungs/Pleura: Bilateral patchy and streaky densities similar to prior CT. Calcified pleural plaque noted in the left upper lobe anteriorly. No pleural effusion pneumothorax. The central airways are patent. Upper Abdomen: No acute abnormality. Musculoskeletal: No  chest wall abnormality. No acute or significant osseous findings. Review of the MIP images confirms the above findings. IMPRESSION: 1. Pulmonary artery embolus extending from the central right pulmonary artery into the lobar and segmental branches of the right lower lobe and right upper lobe, new since the prior CT. No CT evidence of right heart straining. 2. Bilateral patchy and streaky densities similar to prior CT. 3. Aortic Atherosclerosis (ICD10-I70.0). These results were called by telephone at the time of interpretation on 05/25/2020 at 6:39 pm to provider DAVID YAO , who verbally acknowledged these results. Electronically Signed   By: Anner Crete M.D.   On: 05/25/2020 18:43   DG Chest Port 1 View  Result Date: 05/25/2020 CLINICAL DATA:  75 year old male with concern for sepsis. EXAM: PORTABLE CHEST 1 VIEW COMPARISON:  Chest CT dated 05/14/2020. FINDINGS: Diffuse bilateral streaky interstitial densities worsened since 05/09/2020 but relatively similar to 05/10/2020 concerning for residual infiltrate, likely on the background of chronic changes. Clinical correlation and follow-up recommended. No lobar consolidation, pleural effusion, pneumothorax. The cardiac silhouette is within limits. No acute osseous pathology. IMPRESSION: Persistent bilateral infiltrates. Electronically Signed   By: Anner Crete M.D.   On: 05/25/2020 16:24    EKG: Independently reviewed.  Sinus rhythm.  RBBB and LAFB similar to prior tracing.  QTc 514.  New diffuse T wave inversions.  Assessment/Plan Principal Problem:   Pulmonary embolus (HCC) Active Problems:   Rheumatoid arthritis (HCC)   Acute hypoxemic respiratory failure (HCC)   SIRS (systemic inflammatory response syndrome) (HCC)   Elevated troponin   Acute hypoxemic respiratory failure secondary to acute PE He was recently admitted for right lower lobe segmental PE/bilateral DVTs and started on Eliquis - reports compliance and has not missed any doses.   Satting 84% on 2 L home oxygen with EMS.  Currently satting in the mid 90s on 10 L HFNC and appears comfortable.  Seems to have failed Eliquis therapy as CT angiogram chest showing PE extending from the central right pulmonary artery into the lobar and segmental branches of the right lower lobe and right upper lobe, new since prior CT.  No CT evidence of right heart strain. -Continue IV heparin.  Echocardiogram ordered.  Continue supplemental oxygen, wean as tolerated.  SIRS/ possible severe sepsis Patient reports fevers as high as 103 F at home.  He was febrile with EMS with temperature 101 F and was given Tylenol.  However, hypothermic in the ED with rectal temperature 95.7 F.  Slightly tachypneic.  Hypotensive with systolic as low as 16X initially.  Labs showing mild leukocytosis (WBC 11.6), and lactic acidosis (lactate 3.1).  SARS-CoV-2 PCR test positive but active COVID infection felt to be less likely as he is fully vaccinated and tested positive for COVID on 02/29/2020 and was treated during that hospitalization with remdesivir and steroids.  He has continued to test positive for COVID during subsequent hospitalizations since January.  CT showing bilateral patchy and streaky densities, ?CAP superimposed on prior covid pneumonia.  Influenza panel negative.  UA without signs  of infection.  No signs of meningismus.  During his recent hospitalization he had extensive work-up for FUO and infectious disease was involved.  No source of infection was apparent on cultures and numerous imaging studies including CT abdomen/pelvis, MRI C-spine/T-spine.  Infectious disease had discontinued antimicrobial therapy and his fever had responded to high-dose steroids, had suspicion that patient probably either had some residual inflammation post-COVID or had ongoing inflammation related to his underlying rheumatoid arthritis.  Fevers possibly also related to underlying clot burden from DVT/PE.  Prior MD had spoken to  patient's primary rheumatologist Dr. Lenna Gilford who recommended keeping on prednisone 30 mg twice daily.  -Patient was given vancomycin, cefepime, IV Decadron 10 mg, and 30 cc/kg fluid boluses in the ED.  Blood pressure has now improved.  Continue prednisone 30 mg twice daily.  Continue broad-spectrum antibiotics for now (vancomycin, cefepime, also start metronidazole) and check procalcitonin level.  Blood and urine cultures pending.  Continue to monitor WBC count.  Trend lactate.  Check inflammatory markers.  Elevated troponin/EKG changes EKG showing new diffuse T wave inversions.  High sensitive troponin 67.  Does have history of CAD but patient denies chest pain. EKG changes and mild troponin elevation likely due to demand ischemia in the setting of acute PE and SIRS/ severe sepsis. -Cardiac monitoring, trend troponin, echocardiogram ordered.  Mild hyponatremia Corrected sodium 131. -IV fluid hydration, continue to monitor  Rheumatoid arthritis -Continue prednisone 30 mg twice daily.  Outpatient rheumatology follow-up.  COPD No signs of acute exacerbation. -Resume home inhalers after pharmacy med rec is done.  QT prolongation -Cardiac monitoring.  Keep potassium above 4 and magnesium above 2.  Avoid QT prolonging drugs if possible.  Repeat EKG in a.m.  DVT prophylaxis: Heparin Code Status: Patient wishes to be full code. Family Communication: No family available at this time. Disposition Plan: Status is: Inpatient  Remains inpatient appropriate because:Ongoing diagnostic testing needed not appropriate for outpatient work up, Inpatient level of care appropriate due to severity of illness and Acute hypoxemic respiratory failure   Dispo: The patient is from: Home              Anticipated d/c is to: Home              Patient currently is not medically stable to d/c.   Difficult to place patient No  Level of care: Level of care: Progressive   The medical decision making on this patient  was of high complexity and the patient is at high risk for clinical deterioration, therefore this is a level 3 visit.  Shela Leff MD [facility]  If 7PM-7AM, please contact night-coverage [email or web address]  05/25/2020, 10:27 PM

## 2020-05-25 NOTE — Sepsis Progress Note (Signed)
Notified provider of need to order repeat lactic acid. ° °

## 2020-05-25 NOTE — ED Triage Notes (Signed)
Pt arrived to ED via EMS from home. Pt with increasing SOB and fever for last few days. Pt dx with covid in January and has numerous episodes of pneumonia since, last discharged a week ago. Per EMS pt spo2 was 84% on 2l of o2 and febrile 101. Pt given gram of tylenol at 1400. O2 was increased to 5l with improvement, pt remains tachypnea.

## 2020-05-25 NOTE — Progress Notes (Addendum)
Pharmacy Antibiotic Note  Tyrone Roman is a 75 y.o. male admitted on 05/25/2020 with pneumonia and sepsis. Patient with multiple reported abx allergies but has received and tolerated beta-lactams in last few months without issue. Pharmacy has been consulted for vancomycin and cefepime dosing.   Plan: Vancomycin 2g IV x1, then 1250 mg IV q12h (Est. AUC 478)  Cefepime 2g IV q8h - Monitor clinical improvement, culture data, and LOT to determine de-escalation  Height: 6\' 2"  (188 cm) Weight: 88 kg (194 lb 0.1 oz) (Per 4/13 note) IBW/kg (Calculated) : 82.2  Temp (24hrs), Avg:98.8 F (37.1 C), Min:98.8 F (37.1 C), Max:98.8 F (37.1 C)  No results for input(s): WBC, CREATININE, LATICACIDVEN, VANCOTROUGH, VANCOPEAK, VANCORANDOM, GENTTROUGH, GENTPEAK, GENTRANDOM, TOBRATROUGH, TOBRAPEAK, TOBRARND, AMIKACINPEAK, AMIKACINTROU, AMIKACIN in the last 168 hours.  Estimated Creatinine Clearance: 87.6 mL/min (by C-G formula based on SCr of 0.86 mg/dL).    Allergies  Allergen Reactions  . Cephalexin Shortness Of Breath, Rash and Other (See Comments)    Tolerated Augmentin 2015 and 2017 (12-2017). Tolerated nafcillin 12/2017 PATIENT HAS HAD A PCN REACTION WITH IMMEDIATE RASH, FACIAL/TONGUE/THROAT SWELLING, SOB, OR LIGHTHEADEDNESS WITH HYPOTENSION:  #  #  YES  #  #  Has patient had a PCN reaction causing severe rash involving mucus membranes or skin necrosis: No Has patient had a PCN reaction that required hospitalization: No Has patient had a PCN reaction occurring within the last 10 years: No If all of the above answers are "NO", then may proceed  . Clarithromycin Shortness Of Breath and Rash  . Certolizumab Pegol Hives  . Tocilizumab Hives  . Doxycycline Rash  . Hydroxyzine Rash  . Lidoderm Other (See Comments)    "made me act weird"  . Pyrithione Zinc Rash    Antimicrobials this admission: Vancomycin 4/20 >> Cefepime 4/20 >>   Dose adjustments this admission:  Microbiology  results: 4/20 Bcx: 4/20 Ucx: 4/20 Resp Panel:   Thank you for allowing pharmacy to be a part of this patient's care.  Claudina Lick, PharmD PGY1 Acute Care Pharmacy Resident 05/25/2020 3:59 PM  Please check [email or web address] for unit-specific pharmacy phone numbers.

## 2020-05-26 ENCOUNTER — Inpatient Hospital Stay (HOSPITAL_COMMUNITY): Payer: Medicare Other

## 2020-05-26 DIAGNOSIS — J1282 Pneumonia due to coronavirus disease 2019: Secondary | ICD-10-CM

## 2020-05-26 DIAGNOSIS — R778 Other specified abnormalities of plasma proteins: Secondary | ICD-10-CM | POA: Diagnosis not present

## 2020-05-26 DIAGNOSIS — R609 Edema, unspecified: Secondary | ICD-10-CM

## 2020-05-26 DIAGNOSIS — U071 COVID-19: Secondary | ICD-10-CM | POA: Diagnosis not present

## 2020-05-26 DIAGNOSIS — J9601 Acute respiratory failure with hypoxia: Secondary | ICD-10-CM | POA: Diagnosis not present

## 2020-05-26 DIAGNOSIS — R6510 Systemic inflammatory response syndrome (SIRS) of non-infectious origin without acute organ dysfunction: Secondary | ICD-10-CM

## 2020-05-26 DIAGNOSIS — I2609 Other pulmonary embolism with acute cor pulmonale: Secondary | ICD-10-CM | POA: Diagnosis not present

## 2020-05-26 DIAGNOSIS — Z7901 Long term (current) use of anticoagulants: Secondary | ICD-10-CM

## 2020-05-26 DIAGNOSIS — I2699 Other pulmonary embolism without acute cor pulmonale: Secondary | ICD-10-CM | POA: Diagnosis not present

## 2020-05-26 DIAGNOSIS — M0579 Rheumatoid arthritis with rheumatoid factor of multiple sites without organ or systems involvement: Secondary | ICD-10-CM

## 2020-05-26 DIAGNOSIS — R509 Fever, unspecified: Secondary | ICD-10-CM

## 2020-05-26 DIAGNOSIS — I2694 Multiple subsegmental pulmonary emboli without acute cor pulmonale: Secondary | ICD-10-CM | POA: Diagnosis not present

## 2020-05-26 DIAGNOSIS — G9340 Encephalopathy, unspecified: Secondary | ICD-10-CM

## 2020-05-26 DIAGNOSIS — M7989 Other specified soft tissue disorders: Secondary | ICD-10-CM

## 2020-05-26 DIAGNOSIS — J449 Chronic obstructive pulmonary disease, unspecified: Secondary | ICD-10-CM

## 2020-05-26 LAB — APTT
aPTT: 75 seconds — ABNORMAL HIGH (ref 24–36)
aPTT: 128 seconds — ABNORMAL HIGH (ref 24–36)

## 2020-05-26 LAB — HEPARIN LEVEL (UNFRACTIONATED)
Heparin Unfractionated: >1.1 IU/mL — ABNORMAL HIGH (ref 0.30–0.70)
Heparin Unfractionated: >1.1 IU/mL — ABNORMAL HIGH (ref 0.30–0.70)

## 2020-05-26 LAB — CBC
HCT: 32.2 % — ABNORMAL LOW (ref 39.0–52.0)
Hemoglobin: 10.1 g/dL — ABNORMAL LOW (ref 13.0–17.0)
MCH: 28.3 pg (ref 26.0–34.0)
MCHC: 31.4 g/dL (ref 30.0–36.0)
MCV: 90.2 fL (ref 80.0–100.0)
Platelets: 126 10*3/uL — ABNORMAL LOW (ref 150–400)
RBC: 3.57 MIL/uL — ABNORMAL LOW (ref 4.22–5.81)
RDW: 20.2 % — ABNORMAL HIGH (ref 11.5–15.5)
WBC: 7.9 10*3/uL (ref 4.0–10.5)
nRBC: 0 % (ref 0.0–0.2)

## 2020-05-26 LAB — ECHOCARDIOGRAM COMPLETE
Area-P 1/2: 3.01 cm2
Calc EF: 51.8 %
Height: 74 in
S' Lateral: 2.6 cm
Single Plane A2C EF: 52 %
Single Plane A4C EF: 53.1 %
Weight: 3104.08 oz

## 2020-05-26 LAB — C-REACTIVE PROTEIN: CRP: 5.5 mg/dL — ABNORMAL HIGH (ref <1.0)

## 2020-05-26 LAB — FIBRINOGEN: Fibrinogen: 479 mg/dL — ABNORMAL HIGH (ref 210–475)

## 2020-05-26 LAB — D-DIMER, QUANTITATIVE: D-Dimer, Quant: 1.22 ug/mL-FEU — ABNORMAL HIGH (ref 0.00–0.50)

## 2020-05-26 LAB — FERRITIN: Ferritin: 1372 ng/mL — ABNORMAL HIGH (ref 24–336)

## 2020-05-26 LAB — ABO/RH: ABO/RH(D): A POS

## 2020-05-26 LAB — PROCALCITONIN: Procalcitonin: <0.1 ng/mL

## 2020-05-26 MED ORDER — SODIUM CHLORIDE 0.9 % IV SOLN: 100.0000 mg | Freq: Once | INTRAVENOUS | Status: DC

## 2020-05-26 MED ORDER — SODIUM CHLORIDE 0.9 % IV SOLN
100.0000 mg | Freq: Every day | INTRAVENOUS | Status: AC
  Administered 2020-05-27 – 2020-05-30 (×4): 100 mg via INTRAVENOUS
  Filled 2020-05-26 (×4): qty 20

## 2020-05-26 MED ORDER — DIPHENHYDRAMINE HCL 50 MG/ML IJ SOLN: 50.0000 mg | Freq: Once | INTRAMUSCULAR | Status: DC | PRN

## 2020-05-26 MED ORDER — SODIUM CHLORIDE 0.9 % IV SOLN: INTRAVENOUS | Status: DC | PRN

## 2020-05-26 MED ORDER — FAMOTIDINE IN NACL 20-0.9 MG/50ML-% IV SOLN
20.0000 mg | Freq: Once | INTRAVENOUS | Status: DC | PRN
  Filled 2020-05-26: qty 50

## 2020-05-26 MED ORDER — SODIUM CHLORIDE 0.9 % IV SOLN: 100.0000 mg | Freq: Every day | INTRAVENOUS | Status: DC

## 2020-05-26 MED ORDER — TIOTROPIUM BROMIDE MONOHYDRATE 2.5 MCG/ACT IN AERS: 2.0000 | INHALATION_SPRAY | Freq: Every day | RESPIRATORY_TRACT | Status: DC

## 2020-05-26 MED ORDER — METHYLPREDNISOLONE SODIUM SUCC 40 MG IJ SOLR
40.0000 mg | Freq: Two times a day (BID) | INTRAMUSCULAR | Status: DC
  Administered 2020-05-26 – 2020-05-31 (×11): 40 mg via INTRAVENOUS
  Filled 2020-05-26 (×11): qty 1

## 2020-05-26 MED ORDER — ALBUTEROL SULFATE HFA 108 (90 BASE) MCG/ACT IN AERS
2.0000 | INHALATION_SPRAY | Freq: Once | RESPIRATORY_TRACT | Status: AC | PRN
  Administered 2020-05-27: 2 via RESPIRATORY_TRACT
  Filled 2020-05-26: qty 6.7

## 2020-05-26 MED ORDER — MELATONIN 5 MG PO TABS
5.0000 mg | ORAL_TABLET | Freq: Every evening | ORAL | Status: DC | PRN
  Administered 2020-05-28 – 2020-06-01 (×3): 5 mg via ORAL
  Filled 2020-05-26 (×3): qty 1

## 2020-05-26 MED ORDER — MAGNESIUM SULFATE 2 GM/50ML IV SOLN
2.0000 g | Freq: Once | INTRAVENOUS | Status: AC
  Administered 2020-05-26: 2 g via INTRAVENOUS
  Filled 2020-05-26: qty 50

## 2020-05-26 MED ORDER — TAMSULOSIN HCL 0.4 MG PO CAPS
0.4000 mg | ORAL_CAPSULE | Freq: Every day | ORAL | Status: DC
  Administered 2020-05-26 – 2020-06-02 (×8): 0.4 mg via ORAL
  Filled 2020-05-26 (×8): qty 1

## 2020-05-26 MED ORDER — SODIUM CHLORIDE 0.9% IV SOLUTION: Freq: Once | INTRAVENOUS | Status: DC

## 2020-05-26 MED ORDER — UMECLIDINIUM BROMIDE 62.5 MCG/INH IN AEPB
1.0000 | INHALATION_SPRAY | Freq: Every day | RESPIRATORY_TRACT | Status: DC
  Administered 2020-05-26 – 2020-06-02 (×6): 1 via RESPIRATORY_TRACT
  Filled 2020-05-26 (×2): qty 7

## 2020-05-26 MED ORDER — EPINEPHRINE 0.3 MG/0.3ML IJ SOAJ
0.3000 mg | Freq: Once | INTRAMUSCULAR | Status: DC | PRN
  Filled 2020-05-26: qty 0.6

## 2020-05-26 MED ORDER — BEBTELOVIMAB 175 MG/2 ML IV (EUA)
175.0000 mg | Freq: Once | INTRAMUSCULAR | Status: AC
  Administered 2020-05-27: 175 mg via INTRAVENOUS
  Filled 2020-05-26: qty 2

## 2020-05-26 MED ORDER — PANTOPRAZOLE SODIUM 40 MG PO TBEC
40.0000 mg | DELAYED_RELEASE_TABLET | Freq: Every day | ORAL | Status: DC
  Administered 2020-05-26 – 2020-06-02 (×8): 40 mg via ORAL
  Filled 2020-05-26 (×8): qty 1

## 2020-05-26 MED ORDER — PERFLUTREN LIPID MICROSPHERE
1.0000 mL | INTRAVENOUS | Status: AC | PRN
  Filled 2020-05-26: qty 10

## 2020-05-26 MED ORDER — MOMETASONE FURO-FORMOTEROL FUM 200-5 MCG/ACT IN AERO
2.0000 | INHALATION_SPRAY | Freq: Two times a day (BID) | RESPIRATORY_TRACT | Status: DC
  Administered 2020-05-28 – 2020-06-02 (×9): 2 via RESPIRATORY_TRACT
  Filled 2020-05-26 (×2): qty 8.8

## 2020-05-26 MED ORDER — SIMVASTATIN 20 MG PO TABS
20.0000 mg | ORAL_TABLET | Freq: Every day | ORAL | Status: DC
  Administered 2020-05-26 – 2020-06-02 (×8): 20 mg via ORAL
  Filled 2020-05-26 (×8): qty 1

## 2020-05-26 MED ORDER — METHYLPREDNISOLONE SODIUM SUCC 125 MG IJ SOLR: 125.0000 mg | Freq: Once | INTRAMUSCULAR | Status: DC | PRN

## 2020-05-26 MED ORDER — ALBUTEROL SULFATE HFA 108 (90 BASE) MCG/ACT IN AERS
2.0000 | INHALATION_SPRAY | RESPIRATORY_TRACT | Status: DC | PRN
  Filled 2020-05-26: qty 6.7

## 2020-05-26 MED ORDER — SODIUM CHLORIDE 0.9 % IV SOLN
200.0000 mg | Freq: Once | INTRAVENOUS | Status: AC
  Administered 2020-05-26: 200 mg via INTRAVENOUS
  Filled 2020-05-26: qty 40

## 2020-05-26 NOTE — Progress Notes (Signed)
Lower extremity venous has been completed.   Preliminary results in CV Proc.   Tyrone Roman 05/26/2020 11:49 AM

## 2020-05-26 NOTE — Consult Note (Signed)
Date of Admission:  05/25/2020          Reason for Consult: FUO and respiratory failure    Referring Provider:  Dr Sloan Leiter   Assessment:  1. COVID 19 infection (I expect new vs persistent) 2. Lack of COVID-19 immunity despite vaccination boosting in infection in January 3. New pulmonary embolism on anticoagulation\ 4. Prior admissions for fever of unknown origin with encephalopathy which improved with corticosteroids 5. Rheumatoid arthritis 6. COPD 7. History of herniated nucleus pulposus status post neurosurgery 8. Diverticulosis  Plan:  1. Would treat for acute COVID-19 infection with remdesivir and steroids 2. Anticoagulation  3. Agree with consulting pulmonary critical care given his chronic pulmonary pathology and possibility that he could have autoimmune lung disease versus an opportunistic infection (which we would need bronchocscopy to diagnose) in the setting of his chronic corticosteroid use 4. Checking fungitell 5. Stop antibacterial antibiotics  Principal Problem:   Pulmonary embolus (HCC) Active Problems:   Rheumatoid arthritis (HCC)   Acute hypoxemic respiratory failure (HCC)   SIRS (systemic inflammatory response syndrome) (HCC)   Elevated troponin   Scheduled Meds: . methylPREDNISolone (SOLU-MEDROL) injection  40 mg Intravenous Q12H   Continuous Infusions: . heparin 1,250 Units/hr (05/26/20 0700)  . lactated ringers Stopped (05/26/20 2841)   PRN Meds:.acetaminophen **OR** acetaminophen  HPI: Tyrone Roman is a 75 y.o. male with past medical history significant for rheumatoid arthritis on chronic corticosteroids previously on Biologics COPD BPH prior lumbar surgery for herniated nucleus pulposis, diverticulosis who acquired COVID-19 infection January 2022 and has been hospitalized I believe 5 times since then.  I saw him during his last hospitalization this month he was admitted with respiratory failure thought initially due to acute on chronic  congestive heart failure.  He was found to have deep vomit venous thrombosis.  He was on broad-spectrum antibiotics which made [place] no difference on his persistent fevers.  We performed a CT abdomen and pelvis which did not show significant intra-abdominal pathology.  It did show progression of his pulmonary emboli seen on April 9 CT scan.  He also has still extensive groundglass opacities throughout both lungs.  During his hospital stay earlier this month his fevers were accompanied by encephalopathy.  When he was given hydrocortisone at a high dose the fevers completely disappeared along with his encephalopathy and he looked completely normal.  He then became febrile and encephalopathic again when steroids wore off but then improved again when given high-dose corticosteroids round-the-clock.  Our work-up for FUO was negative and he seem to be responding to steroids.  The plan was him for him to go home on 60 mg a day with 30 mg twice daily of prednisone.  He now presented to the ER again with shortness of breath and a fever for the last few days.  He was hypoxic to 84% on 2 L of nasal cannula now was on 10 L of nasal cannula used febrile to 100.1 degrees.  He also had had some hypothermic episodes in the ER along with tachypnea and hypotension.  Blood cultures were taken he was started on broad-spectrum antibiotics CT angiogram was performed which showed a new acute PE extending from the central pulmonary artery into the lobar and segmental branches of the right lower lobe and right upper lobe--this despite him being anticoagulated with Eliquis.  He told the admitting physician that he had felt weak and needed constant support from family that he continued to have fevers as high as  103 degrees--nothing I was completely unaware of.  He has tested for COVID-19 positive yet again.  Now the cycle threshold however is 24.2 which is consistent with active infection.n His values from February was 37.5 and  early April 29.6.  As mentioned above when COVID antibodies were checked he did not have evidence of them.  Suspect he has new COVID-19 infection that is driving his massive inflammatory response.  He is ever complicated patient with known rheumatoid arthritis and prior response to corticosteroids while he was in the hospital.  I wonder if he could have an autoimmune pathology beyond what is being driven by [id number]-60?  Certainly he also could have an opportunistic infection in the setting of immunosuppressive therapy sepsis PCP, a dimorphic fungus and invasive mold or nontuberculous mycobacteria.  That being said his CT scan does not look particularly typical for any of these organisms.  I previously checked a serum cryptococcal antigen which was negative a histoplasma antigen and Blastomyces antigen and urine that were negative sent out for Coccidioides antibodies by complement fixation and a immune diffusion I do not see they have come back yet.  I think it would be prudent to have pulmonary critical care see him and render their opinion.  Certainly if he improves enough to undergo bronchoscopy I would recommend doing this to get BAL for fungal cultures AFB cultures and per PCP.    I am curious whether everything that is going on right now is being driven by [id number] or whether he has con commended autoimmune process driven by his rheumatoid arthritis.  Again  In terms of other ID work-up he was negative for HIV negative for hepatitis panel positive for prior infection with Epstein-Barr negative for CMV infection in the past or present.  Inflammatory markers were elevated.  ANCA was negative ANA negative rheumatoid factor 12.9.        Review of Systems: Review of Systems  Constitutional: Positive for fever and malaise/fatigue. Negative for chills and weight loss.  HENT: Negative for congestion and sore throat.   Eyes: Negative for blurred vision and photophobia.   Respiratory: Positive for cough. Negative for shortness of breath and wheezing.   Cardiovascular: Positive for palpitations. Negative for chest pain and leg swelling.  Gastrointestinal: Negative for abdominal pain, blood in stool, constipation, diarrhea, heartburn, melena, nausea and vomiting.  Genitourinary: Negative for dysuria, flank pain and hematuria.  Musculoskeletal: Negative for back pain, falls, joint pain and myalgias.  Skin: Negative for itching and rash.  Neurological: Positive for weakness. Negative for dizziness, focal weakness, loss of consciousness and headaches.  Endo/Heme/Allergies: Does not bruise/bleed easily.  Psychiatric/Behavioral: Negative for depression and suicidal ideas. The patient does not have insomnia.     Past Medical History:  Diagnosis Date  . Allergy   . Barrett's esophageal ulceration   . BPH (benign prostatic hyperplasia)   . CAD (coronary artery disease)    sees Dr. Mar Daring  . Cataract    both eyes removed   . Colonic polyp   . Concussion with loss of consciousness of 30 minutes or less   . Contact dermatitis   . Contact with or exposure to venereal diseases   . COPD (chronic obstructive pulmonary disease) ([place])    sees Dr. Kara Mead   . Diverticulosis of colon   . Dysphagia   . GERD (gastroesophageal reflux disease)    past hx- on meds   . HNP (herniated nucleus pulposus), lumbar    recurrent  .  Hyperlipidemia   . Hypertension   . Myocardial infarction [facility])    2011- stent placed  . Nephrolithiasis   . Rheumatoid arthritis [facility])    sees Dr. Gavin Pound   . SOB (shortness of breath)     Social History   Tobacco Use  . Smoking status: Former Smoker    Packs/day: 1.00    Years: 20.00    Pack years: 20.00    Types: Cigarettes    Quit date: 10/12/1988    Years since quitting: 31.6  . Smokeless tobacco: Never Used  . Tobacco comment: 1960's   Vaping Use  . Vaping Use: Never used  Substance Use Topics  . Alcohol use: Not  Currently    Alcohol/week: 0.0 standard drinks    Comment: rare  . Drug use: No    Family History  Problem Relation Age of Onset  . Heart attack Father        cardiovascular disorder  . Arthritis Father        family hx  . Colon cancer Father        mets  . Prostate cancer Father        1st degree relative  . Arthritis Mother   . Colon polyps Mother   . Melanoma Mother   . Dementia Mother   . Diabetes Paternal Uncle   . Colon cancer Paternal Uncle   . Stomach cancer Paternal Uncle   . Melanoma Paternal Uncle   . Cancer Maternal Grandmother   . Skin cancer Daughter   . Colon polyps Sister   . Esophageal cancer Neg Hx   . Rectal cancer Neg Hx    Allergies  Allergen Reactions  . Cephalexin Shortness Of Breath, Rash and Other (See Comments)    Tolerated Augmentin 2015 and 2017 (12-2017). Tolerated nafcillin 12/2017 PATIENT HAS HAD A PCN REACTION WITH IMMEDIATE RASH, FACIAL/TONGUE/THROAT SWELLING, SOB, OR LIGHTHEADEDNESS WITH HYPOTENSION:  #  #  YES  #  #  Has patient had a PCN reaction causing severe rash involving mucus membranes or skin necrosis: No Has patient had a PCN reaction that required hospitalization: No Has patient had a PCN reaction occurring within the last 10 years: No If all of the above answers are "NO", then may proceed  . Clarithromycin Shortness Of Breath and Rash  . Certolizumab Pegol Hives  . Tocilizumab Hives  . Doxycycline Rash  . Hydroxyzine Rash  . Lidoderm Other (See Comments)    "made me act weird"  . Pyrithione Zinc Rash    OBJECTIVE: Blood pressure 120/83, pulse 100, temperature (!) 97.3 F (36.3 C), temperature source Oral, resp. rate 20, height 6\' 2"  (1.88 m), weight 88 kg, SpO2 90 %.  Physical Exam Constitutional:      Appearance: He is well-developed.  HENT:     Head: Normocephalic and atraumatic.  Eyes:     Extraocular Movements: Extraocular movements intact.     Conjunctiva/sclera: Conjunctivae normal.  Cardiovascular:      Rate and Rhythm: Regular rhythm. Tachycardia present.  Pulmonary:     Effort: Pulmonary effort is normal. No respiratory distress.     Breath sounds: No stridor. No wheezing or rhonchi.  Abdominal:     General: There is no distension.     Palpations: Abdomen is soft. There is mass.  Musculoskeletal:        General: No tenderness. Normal range of motion.     Cervical back: Normal range of motion and neck supple.  Skin:  General: Skin is warm and dry.     Coloration: Skin is not pale.     Findings: No erythema or rash.  Neurological:     General: No focal deficit present.     Mental Status: He is alert and oriented to person, place, and time.  Psychiatric:        Attention and Perception: Attention and perception normal.        Mood and Affect: Mood is anxious and depressed.        Behavior: Behavior normal. Behavior is cooperative.        Thought Content: Thought content normal.        Cognition and Memory: Cognition and memory normal.        Judgment: Judgment normal.     Lab Results Lab Results  Component Value Date   WBC 7.9 05/26/2020   HGB 10.1 (L) 05/26/2020   HCT 32.2 (L) 05/26/2020   MCV 90.2 05/26/2020   PLT 126 (L) 05/26/2020    Lab Results  Component Value Date   CREATININE 0.80 05/25/2020   BUN 21 05/25/2020   NA 128 (L) 05/25/2020   K 4.7 05/25/2020   CL 99 05/25/2020   CO2 21 (L) 05/25/2020    Lab Results  Component Value Date   ALT 27 05/25/2020   AST 29 05/25/2020   ALKPHOS 76 05/25/2020   BILITOT 0.8 05/25/2020     Microbiology: Recent Results (from the past 240 hour(s))  Resp Panel by RT-PCR (Flu A&B, Covid) Nasopharyngeal Swab     Status: Abnormal   Collection Time: 05/25/20  3:56 PM   Specimen: Nasopharyngeal Swab; Nasopharyngeal(NP) swabs in vial transport medium  Result Value Ref Range Status   SARS Coronavirus 2 by RT PCR POSITIVE (A) NEGATIVE Final    Comment: RESULT CALLED TO, READ BACK BY AND VERIFIED WITH: Sherlean Foot RN 2032  05/25/20 A BROWNING (NOTE) SARS-CoV-2 target nucleic acids are DETECTED.  The SARS-CoV-2 RNA is generally detectable in upper respiratory specimens during the acute phase of infection. Positive results are indicative of the presence of the identified virus, but do not rule out bacterial infection or co-infection with other pathogens not detected by the test. Clinical correlation with patient history and other diagnostic information is necessary to determine patient infection status. The expected result is Negative.  Fact Sheet for Patients: EntrepreneurPulse.com.au  Fact Sheet for Healthcare Providers: IncredibleEmployment.be  This test is not yet approved or cleared by the [place] FDA and  has been authorized for detection and/or diagnosis of SARS-CoV-2 by FDA under an Emergency Use Authorization (EUA).  This EUA will remain in effect (meaning this test can  be used) for the duration of  the COVID-19 declaration under Section 564(b)(1) of the Act, 21 U.S.C. section 360bbb-3(b)(1), unless the authorization is terminated or revoked sooner.     Influenza A by PCR NEGATIVE NEGATIVE Final   Influenza B by PCR NEGATIVE NEGATIVE Final    Comment: (NOTE) The Xpert Xpress SARS-CoV-2/FLU/RSV plus assay is intended as an aid in the diagnosis of influenza from Nasopharyngeal swab specimens and should not be used as a sole basis for treatment. Nasal washings and aspirates are unacceptable for Xpert Xpress SARS-CoV-2/FLU/RSV testing.  Fact Sheet for Patients: EntrepreneurPulse.com.au  Fact Sheet for Healthcare Providers: IncredibleEmployment.be  This test is not yet approved or cleared by the [place] FDA and has been authorized for detection and/or diagnosis of SARS-CoV-2 by FDA under an Emergency Use Authorization (EUA). This EUA  will remain in effect (meaning this test can be used) for the duration of  the COVID-19 declaration under Section 564(b)(1) of the Act, 21 U.S.C. section 360bbb-3(b)(1), unless the authorization is terminated or revoked.  Performed at [facility], [place] [address], [place], [place] [address]   Blood Culture (routine x 2)     Status: None (Preliminary result)   Collection Time: 05/25/20  3:59 PM   Specimen: BLOOD  Result Value Ref Range Status   Specimen Description BLOOD SITE NOT SPECIFIED  Final   Special Requests   Final    BOTTLES DRAWN AEROBIC AND ANAEROBIC Blood Culture results may not be optimal due to an excessive volume of blood received in culture bottles   Culture   Final    NO GROWTH < 24 HOURS Performed at [facility], [place] [address], [place], [place] [address]    Report Status PENDING  Incomplete  Blood Culture (routine x 2)     Status: None (Preliminary result)   Collection Time: 05/25/20  6:18 PM   Specimen: BLOOD RIGHT HAND  Result Value Ref Range Status   Specimen Description BLOOD RIGHT HAND  Final   Special Requests   Final    BOTTLES DRAWN AEROBIC AND ANAEROBIC Blood Culture adequate volume   Culture   Final    NO GROWTH < 12 HOURS Performed at [facility], [place] [address], [place], [place] [address]    Report Status PENDING  Incomplete    Alcide Evener, [facility] [phone] pager  05/26/2020, 11:28 AM

## 2020-05-26 NOTE — Progress Notes (Addendum)
PROGRESS NOTE                                                                                                                                                                                                             Patient Demographics:    Tyrone Roman, is a 75 y.o. male, DOB - [birth year], FWY:[id number]  Outpatient Primary MD for the patient is Tyrone Morale, MD   Admit date - 05/25/2020   LOS - 1  Chief Complaint  Patient presents with  . Code Sepsis       Brief Narrative: Patient is a 75 y.o. male with PMHx of RA, CAD s/p PCI, HTN, HLD, COPD, BPH-who had COVID-19 infection in January 2021-since then has had numerous hospitalizations for shortness of breath-FUO-most recently discharged from 4/4-4/13-after extensive work-up including ID evaluation-found to have PE/DVT-maintained on prednisone 30 mg p.o. twice daily (fever responsive to steroids per prior notes)-presenting back to the hospital with 4-5-day history of recurrent fever (as high as 103 F) and worsening shortness of breath.  See below for further details.  COVID-19 vaccinated status: Vaccinated including booster (prior COVID antibodies undetectable in spite of vaccine/natural infection)  Significant Events: 4/20>> Admit to [facility] for recurrent fever/shortness of breath-significant infiltrates on CT chest-worsening clot burden.  Requiring anywhere from 10-12 L of oxygen.  Significant studies: 4/20>> CTA chest: PE extending from right pulmonary artery into lobar/segmental branches, significant bilateral pulmonary infiltrates. 4/21>> bilateral lower extremity Doppler: Left popliteal vein, left posterior tibial vein, left peroneal vein DVT. 4/13>> MPO/ANCA: Negative 4/12>> SPEP: No M spike 4/10>> RA factor: 12.9 (WNL) 4/10>> ACE: 34 (WNL) 4/9>> GOLD QuantiFERON: Negative 4/9>> CMV IgG/IgM: Negative 4/9>> EBV IgM: Negative 4/9>> ANA: Negative 4/9>> Serum  cryptococcal/histoplasma/blastomycosis antigen: Negative 4/9>> SARS-COV-2 antibody-IgG: Negative 4/6>> bilateral lower extremity Doppler: Bilateral lower extremity DVT 2/27>> Echo: EF 60-65%  COVID-19 medications: Remdesivir: 4/21>>  Antibiotics: Vancomycin: 4/20 x 1 Flagyl: 4/20>> 4/21 Cefepime: 4/20 x 1  Microbiology data: 4/20 >>blood culture: No growth 4/20>> SARS 2 coronavirus PCR: Positive with cycle threshold of 24.2 4/4>> SARS 2 coronavirus PCR: Positive with cycle threshold of 29.6 2/18>> SARS 2 coronavirus PCR: Positive with cycle threshold of 37.5  Procedures: None  Consults: ID, PCCM  DVT prophylaxis: IV heparin    Subjective:    Tyrone Roman today looks frail-deconditioned-acknowledges shortness of breath  with minimal activity.  On anywhere from 10 to 12 L of oxygen this morning.   Assessment  & Plan :   Acute hypoxic respiratory failure: Multifactorial etiology-worsening PE-possible ongoing/new COVID-19 infection-superimposed on significant bilateral pulmonary infiltrates (autoimmune/COVID inflammation versus opportunistic infection).  Continue IV heparin-escalate steroids to Solu-Medrol-and start IV Remdesivir.  Discussed with ID-stop antibiotics.  Awaiting pulmonary input- since etiology of these infiltrates are unclear-may require bronchoscopy with BAL-however his clinical condition remains tenuous as he is significantly hypoxic requiring up to 12 L of HFNC.  Acute vs persistent COVID-19 infection: Continues to test positive for COVID-19 since January-however last 2 tests in April-with significant cycle threshold-that continues to indicate the patient is infectious.  Some suspicion that he may have picked up a new COVID-19 strain earlier this month.  Hence treating with steroids and Remdesivir.  Due to possibility of underlying opportunistic infection-diagnostic uncertainty-holding off on Actemra/baricitinib.  Interestingly-patient's serum antibody to COVID on  4/9 was negative.  Bilateral pulmonary infiltrates:  Etiology of these infiltrates are clear-given significant steroid use-opportunistic infections are in the differential-however it is possible that these are related to RA/COVID related inflammation-as per prior notes-fever-overall clinical condition have been responsive to high-dose steroids.  Awaiting PCCM opinion.  Pulmonary embolism/DVT: Has significant clot burden on CT done on 4/20-in spite of being on Eliquis-lower extremity Doppler on 4/21 only shows left lower extremity DVT-a Doppler on 4/6 showed bilateral DVT.  Suspect that in spite of being on anticoagulation-could have had further embolic event from his legs to the lung.  We will continue to maintain on IV heparin for now-upon further stability-we will plan to transition to an alternate agent.  Although severely hypoxic-this appears to be multifactorial and not just from increased clot burden.  Awaiting echo to assess for RV strain-although he does not have JVD and other clinical features consistent with RV strain.  FUO: Unclear etiology-Per prior notes-fever-overall clinical condition has been steroid responsive-prior MD spoke with patient's primary rheumatologist-and recommendations were to keep on prednisone 30 mg p.o. twice daily.  In spite of being on prednisone-patient continues to spike fevers.  Multiple differentials for fever at this point-possible new COVID-19 infection-worsening clot burden-and more worrisome is that it could be from a opportunistic infection given significant pulmonary infiltrates.  ID following-culture data-work-up as above.  Chronic diastolic heart failure: Euvolemic on exam-await echo-we will keep in negative balance and dose with diuretics periodically.  Hyponatremia: Euvolemic on exam-could have SIADH pathophysiology-since mild-watch for now.  Will challenge with diuretics over the next few days.  HTN: BP is stable-resume antihypertensives over the next few  days.  HLD: Continue statin  History of rheumatoid arthritis: Has been on prednisone for the past several months-see above.  COPD: Resume inhaler regimen  GERD: Continue PPI  BPH: Continue Flomax  Debility/deconditioning: Due to acute on chronic illness-await PT/OT eval.  Previously had refused SNF.  ABG:    Component Value Date/Time   PHART 7.369 06/08/2009 1024   PCO2ART 40.7 06/08/2009 1024   PO2ART 73.0 (L) 06/08/2009 1024   HCO3 24.7 04/02/2020 2145   TCO2 21 (L) 05/25/2020 1626   ACIDBASEDEF 1.0 06/08/2009 1029   O2SAT 81.0 04/02/2020 2145    Vent Settings: N/A    Condition - Extremely Guarded  Family Communication  :  Cameron-Daughter-[phone]-left a voicemail on 4/21  Code Status :  Full Code  Diet :  Diet Order            Diet Heart Room service appropriate? Yes;  Fluid consistency: Thin  Diet effective now                  Disposition Plan  :   Status is: Inpatient  Remains inpatient appropriate because:Inpatient level of care appropriate due to severity of illness   Dispo: The patient is from: Home              Anticipated d/c is to: TBD              Patient currently is not medically stable to d/c.   Difficult to place patient No    Barriers to discharge: Hypoxia requiring O2 supplementation/complete 5 days of IV Remdesivir  Antimicorbials  :    Anti-infectives (From admission, onward)   Start     Dose/Rate Route Frequency Ordered Stop   05/26/20 0500  vancomycin (VANCOREADY) IVPB 1250 mg/250 mL  Status:  Discontinued        1,250 mg 166.7 mL/hr over 90 Minutes Intravenous Every 12 hours 05/25/20 1748 05/26/20 1108   05/26/20 0000  ceFEPIme (MAXIPIME) 2 g in sodium chloride 0.9 % 100 mL IVPB  Status:  Discontinued        2 g 200 mL/hr over 30 Minutes Intravenous Every 8 hours 05/25/20 1745 05/26/20 1108   05/25/20 2315  metroNIDAZOLE (FLAGYL) IVPB 500 mg  Status:  Discontinued        500 mg 100 mL/hr over 60 Minutes Intravenous  Every 8 hours 05/25/20 2223 05/26/20 1108   05/25/20 1600  vancomycin (VANCOREADY) IVPB 1000 mg/200 mL  Status:  Discontinued        1,000 mg 200 mL/hr over 60 Minutes Intravenous  Once 05/25/20 1548 05/25/20 1555   05/25/20 1600  aztreonam (AZACTAM) 2 g in sodium chloride 0.9 % 100 mL IVPB  Status:  Discontinued        2 g 200 mL/hr over 30 Minutes Intravenous  Once 05/25/20 1548 05/25/20 1552   05/25/20 1600  ceFEPIme (MAXIPIME) 2 g in sodium chloride 0.9 % 100 mL IVPB  Status:  Discontinued        2 g 200 mL/hr over 30 Minutes Intravenous Every 8 hours 05/25/20 1555 05/25/20 1557   05/25/20 1600  vancomycin (VANCOREADY) IVPB 2000 mg/400 mL        2,000 mg 200 mL/hr over 120 Minutes Intravenous  Once 05/25/20 1557 05/25/20 1918   05/25/20 1600  ceFEPIme (MAXIPIME) 2 g in sodium chloride 0.9 % 100 mL IVPB        2 g 200 mL/hr over 30 Minutes Intravenous  Once 05/25/20 1557 05/25/20 1647      Inpatient Medications  Scheduled Meds: . methylPREDNISolone (SOLU-MEDROL) injection  40 mg Intravenous Q12H   Continuous Infusions: . heparin 1,250 Units/hr (05/26/20 0700)  . lactated ringers Stopped (05/26/20 1751)   PRN Meds:.acetaminophen **OR** acetaminophen   Time Spent in minutes  45  See all Orders from today for further details   Oren Binet M.D on 05/26/2020 at 11:11 AM  To page go to [email or web address] - use universal password  [facility] -  Office  [phone]    Objective:   Vitals:   05/26/20 0500 05/26/20 0600 05/26/20 0700 05/26/20 0800  BP: 117/82 134/84 (!) 121/95 120/83  Pulse: 84 89 93 100  Resp: (!) 23 20 18 20   Temp:  (!) 96.8 F (36 C)  (!) 97.3 F (36.3 C)  TempSrc:  Axillary  Oral  SpO2: 91% 90% 95% 90%  Weight:  Height:        Wt Readings from Last 3 Encounters:  05/25/20 88 kg  05/09/20 88 kg  05/05/20 88.5 kg     Intake/Output Summary (Last 24 hours) at 05/26/2020 1111 Last data filed at 05/26/2020 0700 Gross per 24 hour   Intake 2311.45 ml  Output --  Net 2311.45 ml     Physical Exam Gen Exam:Alert awake-not in any distress HEENT:atraumatic, normocephalic Chest: Bibasilar rales-appears to have some inspiratory crackles. CVS:S1S2 regular Abdomen:soft non tender, non distended Extremities:no edema Neurology: Non focal Skin: no rash   Data Review:    CBC Recent Labs  Lab 05/25/20 1547 05/25/20 1626 05/26/20 0141  WBC 11.6*  --  7.9  HGB 11.5* 12.2* 10.1*  HCT 36.6* 36.0* 32.2*  PLT 155  --  126*  MCV 89.3  --  90.2  MCH 28.0  --  28.3  MCHC 31.4  --  31.4  RDW 20.5*  --  20.2*  LYMPHSABS 0.2*  --   --   MONOABS 0.2  --   --   EOSABS 0.0  --   --   BASOSABS 0.0  --   --     Chemistries  Recent Labs  Lab 05/25/20 1547 05/25/20 1626 05/25/20 2231  NA 129* 128*  --   K 4.6 4.7  --   CL 98 99  --   CO2 21*  --   --   GLUCOSE 225* 228*  --   BUN 21 21  --   CREATININE 0.90 0.80  --   CALCIUM 9.5  --   --   MG  --   --  1.7  AST 29  --   --   ALT 27  --   --   ALKPHOS 76  --   --   BILITOT 0.8  --   --    ------------------------------------------------------------------------------------------------------------------ No results for input(s): CHOL, HDL, LDLCALC, TRIG, CHOLHDL, LDLDIRECT in the last 72 hours.  Lab Results  Component Value Date   HGBA1C 6.3 (H) 05/12/2020   ------------------------------------------------------------------------------------------------------------------ No results for input(s): TSH, T4TOTAL, T3FREE, THYROIDAB in the last 72 hours.  Invalid input(s): FREET3 ------------------------------------------------------------------------------------------------------------------ Recent Labs    05/26/20 0141  FERRITIN 1,372*    Coagulation profile Recent Labs  Lab 05/25/20 1547  INR 1.7*    Recent Labs    05/26/20 0141  DDIMER 1.22*    Cardiac Enzymes No results for input(s): CKMB, TROPONINI, MYOGLOBIN in the last 168  hours.  Invalid input(s): CK ------------------------------------------------------------------------------------------------------------------    Component Value Date/Time   BNP 144.3 (H) 05/14/2020 0010    Micro Results Recent Results (from the past 240 hour(s))  Resp Panel by RT-PCR (Flu A&B, Covid) Nasopharyngeal Swab     Status: Abnormal   Collection Time: 05/25/20  3:56 PM   Specimen: Nasopharyngeal Swab; Nasopharyngeal(NP) swabs in vial transport medium  Result Value Ref Range Status   SARS Coronavirus 2 by RT PCR POSITIVE (A) NEGATIVE Final    Comment: RESULT CALLED TO, READ BACK BY AND VERIFIED WITH: Sherlean Foot RN 2032 05/25/20 A BROWNING (NOTE) SARS-CoV-2 target nucleic acids are DETECTED.  The SARS-CoV-2 RNA is generally detectable in upper respiratory specimens during the acute phase of infection. Positive results are indicative of the presence of the identified virus, but do not rule out bacterial infection or co-infection with other pathogens not detected by the test. Clinical correlation with patient history and other diagnostic information is necessary to determine patient infection  status. The expected result is Negative.  Fact Sheet for Patients: EntrepreneurPulse.com.au  Fact Sheet for Healthcare Providers: IncredibleEmployment.be  This test is not yet approved or cleared by the [place] FDA and  has been authorized for detection and/or diagnosis of SARS-CoV-2 by FDA under an Emergency Use Authorization (EUA).  This EUA will remain in effect (meaning this test can  be used) for the duration of  the COVID-19 declaration under Section 564(b)(1) of the Act, 21 U.S.C. section 360bbb-3(b)(1), unless the authorization is terminated or revoked sooner.     Influenza A by PCR NEGATIVE NEGATIVE Final   Influenza B by PCR NEGATIVE NEGATIVE Final    Comment: (NOTE) The Xpert Xpress SARS-CoV-2/FLU/RSV plus assay is intended as  an aid in the diagnosis of influenza from Nasopharyngeal swab specimens and should not be used as a sole basis for treatment. Nasal washings and aspirates are unacceptable for Xpert Xpress SARS-CoV-2/FLU/RSV testing.  Fact Sheet for Patients: EntrepreneurPulse.com.au  Fact Sheet for Healthcare Providers: IncredibleEmployment.be  This test is not yet approved or cleared by the [place] FDA and has been authorized for detection and/or diagnosis of SARS-CoV-2 by FDA under an Emergency Use Authorization (EUA). This EUA will remain in effect (meaning this test can be used) for the duration of the COVID-19 declaration under Section 564(b)(1) of the Act, 21 U.S.C. section 360bbb-3(b)(1), unless the authorization is terminated or revoked.  Performed at [facility], [place] [address] [place], [place] [address]   Blood Culture (routine x 2)     Status: None (Preliminary result)   Collection Time: 05/25/20  3:59 PM   Specimen: BLOOD  Result Value Ref Range Status   Specimen Description BLOOD SITE NOT SPECIFIED  Final   Special Requests   Final    BOTTLES DRAWN AEROBIC AND ANAEROBIC Blood Culture results may not be optimal due to an excessive volume of blood received in culture bottles   Culture   Final    NO GROWTH < 24 HOURS Performed at [facility], [place] [address], [place], [place] [address]    Report Status PENDING  Incomplete  Blood Culture (routine x 2)     Status: None (Preliminary result)   Collection Time: 05/25/20  6:18 PM   Specimen: BLOOD RIGHT HAND  Result Value Ref Range Status   Specimen Description BLOOD RIGHT HAND  Final   Special Requests   Final    BOTTLES DRAWN AEROBIC AND ANAEROBIC Blood Culture adequate volume   Culture   Final    NO GROWTH < 12 HOURS Performed at [facility], [place] [address], [place], [place] [address]    Report Status PENDING  Incomplete    Radiology Reports CT Angio Chest PE W  and/or Wo Contrast  Result Date: 05/25/2020 CLINICAL DATA:  75 year old male with shortness of breath and chest pain. Concern for pulmonary embolism. EXAM: CT ANGIOGRAPHY CHEST WITH CONTRAST TECHNIQUE: Multidetector CT imaging of the chest was performed using the standard protocol during bolus administration of intravenous contrast. Multiplanar CT image reconstructions and MIPs were obtained to evaluate the vascular anatomy. CONTRAST:  42mL OMNIPAQUE IOHEXOL 350 MG/ML SOLN COMPARISON:  Chest CT dated 05/14/2020. FINDINGS: Cardiovascular: Mild cardiomegaly. No pericardial effusion. There is coronary vascular calcification. Mild atherosclerotic calcification of the thoracic aorta. Linear nonocclusive thrombus noted extending from the central right pulmonary artery into the lobar and segmental branches of the right lower lobe and right upper lobe. This is new since the prior  CT. No CT evidence of right heart straining. Mediastinum/Nodes: Top-normal right hilar lymph nodes. The esophagus and the thyroid gland are grossly unremarkable. No mediastinal fluid collection. Lungs/Pleura: Bilateral patchy and streaky densities similar to prior CT. Calcified pleural plaque noted in the left upper lobe anteriorly. No pleural effusion pneumothorax. The central airways are patent. Upper Abdomen: No acute abnormality. Musculoskeletal: No chest wall abnormality. No acute or significant osseous findings. Review of the MIP images confirms the above findings. IMPRESSION: 1. Pulmonary artery embolus extending from the central right pulmonary artery into the lobar and segmental branches of the right lower lobe and right upper lobe, new since the prior CT. No CT evidence of right heart straining. 2. Bilateral patchy and streaky densities similar to prior CT. 3. Aortic Atherosclerosis (ICD10-I70.0). These results were called by telephone at the time of interpretation on 05/25/2020 at 6:39 pm to provider DAVID YAO , who verbally  acknowledged these results. Electronically Signed   By: Anner Crete M.D.   On: 05/25/2020 18:43   CT ANGIO CHEST PE W OR WO CONTRAST  Result Date: 05/14/2020 CLINICAL DATA:  High probability sepsis due to pneumonia EXAM: CT ANGIOGRAPHY CHEST WITH CONTRAST TECHNIQUE: Multidetector CT imaging of the chest was performed using the standard protocol during bolus administration of intravenous contrast. Multiplanar CT image reconstructions and MIPs were obtained to evaluate the vascular anatomy. CONTRAST:  32mL OMNIPAQUE IOHEXOL 350 MG/ML SOLN COMPARISON:  04/02/2020 FINDINGS: Cardiovascular: Satisfactory opacification by motion of pulmonary artifact. Arteries although limited there is a convincing branching pulmonary artery filling defect affecting segmental vessels in the basal right lower lobe as marked on series 6. Additional peripheral emboli could be present and obscured by the degree of motion artifact. No central embolism is seen. No evidence of right heart strain. Heart size is normal. No pericardial effusion. Atherosclerosis of the coronaries. Mediastinum/Nodes: Negative for adenopathy or mass. Lungs/Pleura: Chronic lung disease with reticulation. Bilateral pleural plaque appearance without calcification. There is increased ground-glass density scattered in the bilateral lungs, greatest in the right upper lobe. No interlobular septal thickening or pleural fluid to implicate failure. Mild centrilobular emphysema. Upper Abdomen: Negative Musculoskeletal: No unexpected finding Review of the MIP images confirms the above findings. Critical Value/emergent results were called by telephone at the time of interpretation on 05/14/2020 at 8:35 am to provider Elgergawy, who verbally acknowledged these results. IMPRESSION: 1. Positive for segmental pulmonary embolism in the right lower lobe. Additional pulmonary emboli could be obscured by the degree of motion artifact. 2. Background of chronic opacities related to  recent COVID infection. There is also new ground-glass opacity especially in the right upper lobe suggesting a superimposed acute pneumonia. 3. Aortic Atherosclerosis (ICD10-I70.0).  Coronary atherosclerosis. Electronically Signed   By: Monte Fantasia M.D.   On: 05/14/2020 08:35   MR BRAIN W WO CONTRAST  Result Date: 05/16/2020 CLINICAL DATA:  Delirium; fever with unknown origin, altered mental status, evaluate for encephalitis. EXAM: MRI HEAD WITHOUT AND WITH CONTRAST TECHNIQUE: Multiplanar, multiecho pulse sequences of the brain and surrounding structures were obtained without and with intravenous contrast. CONTRAST:  50mL GADAVIST GADOBUTROL 1 MMOL/ML IV SOLN COMPARISON:  Prior head CT examinations 04/02/2020 and earlier. Brain MRI 12/13/2017. FINDINGS: Brain: Mild cerebral and cerebellar atrophy. AP punctate focus of restricted diffusion is questioned within the right parietal lobe (versus artifact) (series 3, image 33). Additionally, there is apparent subtle restricted diffusion along the midline callosal splenium, slightly eccentric to the left. A few small foci of T2/FLAIR hyperintensity  within the bilateral cerebral white matter and right thalamus are nonspecific, but likely reflect minimal chronic small vessel ischemic disease. Redemonstrated left retrocerebellar arachnoid cyst. There is no acute infarct. No evidence of intracranial mass. No chronic intracranial blood products. No extra-axial fluid collection. No midline shift. No abnormal intracranial enhancement. Vascular: Expected proximal arterial flow voids. Skull and upper cervical spine: No focal marrow lesion. Incompletely assessed cervical spondylosis. Suspected C4-C5 vertebral body ankylosis. Sinuses/Orbits: Visualized orbits show no acute finding. Bilateral lens replacements. Mild paranasal sinus mucosal thickening, most notably within the bilateral ethmoid air cells. Small left maxillary sinus mucous retention cyst. Other: Right mastoid  effusion. IMPRESSION: Punctate acute infarct versus artifact within the right parietal lobe. Additionally, there is apparent subtle restricted diffusion within the midline callosal splenium, slightly eccentric to the left. This may reflect a small focus of acute ischemia or artifact. Alternatively, this may reflect a cytotoxic lesion of the corpus callosum (which has a broad range of potential etiologies and clinical associations), although the appearance would be atypical for this entity. Background mild parenchymal atrophy and chronic small vessel ischemic disease. Mild paranasal sinus disease as described. Right mastoid effusion. Electronically Signed   By: Kellie Simmering DO   On: 05/16/2020 18:51   MR CERVICAL SPINE W WO CONTRAST  Result Date: 05/17/2020 CLINICAL DATA:  Initial screening for possible epidural abscess. EXAM: MRI CERVICAL SPINE WITHOUT AND WITH CONTRAST TECHNIQUE: Multiplanar and multiecho pulse sequences of the cervical spine, to include the craniocervical junction and cervicothoracic junction, were obtained without and with intravenous contrast. CONTRAST:  69mL GADAVIST GADOBUTROL 1 MMOL/ML IV SOLN COMPARISON:  Prior MRI from 03/24/2010. FINDINGS: Alignment: Reversal of the normal cervical lordosis with apex at C3-4. Trace anterolisthesis of C3 on C4 and C7 on T1, chronic and facet mediated. Vertebrae: Vertebral body height maintained without acute or chronic fracture. C4 and C5 vertebral bodies are largely ankylosed, likely degenerative. Bone marrow signal intensity within normal limits. No discrete or worrisome osseous lesions. No abnormal marrow edema or enhancement. No findings to suggest osteomyelitis discitis or septic arthritis. Cord: Normal signal and morphology. No epidural abscess or other collection. Posterior Fossa, vertebral arteries, paraspinal tissues: Probable benign retro cerebellar arachnoid cyst noted. Visualized brain and posterior fossa otherwise unremarkable.  Craniocervical junction within normal limits. Paraspinous and prevertebral soft tissues normal. Normal flow voids seen within the vertebral arteries bilaterally. Disc levels: C2-C3: Negative interspace. Right worse than left facet hypertrophy. No spinal stenosis. Mild right C3 foraminal narrowing. Left neural foramina remains patent. C3-C4: Broad-based posterior disc osteophyte flattens and effaces the ventral thecal sac. Mild flattening of the ventral cord without cord signal changes. Mild spinal stenosis. Superimposed bilateral facet hypertrophy with left greater than right uncovertebral spurring. Severe left with moderate right C4 foraminal stenosis. C4-C5: C4 and C5 vertebral bodies are largely ankylosed. Broad posterior endplate osseous ridging flattens and effaces the ventral thecal sac. Mild spinal stenosis without cord impingement. Moderate right worse than left C5 foraminal stenosis. C5-C6: Degenerative intervertebral disc space narrowing with diffuse disc osteophyte complex. Mild flattening of the ventral thecal sac without significant spinal stenosis. Moderate left worse than right C6 foraminal narrowing. C6-C7: Degenerative intervertebral disc space narrowing with diffuse disc osteophyte complex. No significant spinal stenosis. Severe left with moderate right C7 foraminal stenosis. C7-T1: Trace anterolisthesis. No significant disc bulge. Moderate bilateral facet and ligament flavum hypertrophy. No significant spinal stenosis. Foramina remain patent. IMPRESSION: 1. No evidence for osteomyelitis discitis or septic arthritis within the cervical spine. No  epidural abscess or other infection. 2. Multilevel cervical spondylosis with resultant mild spinal stenosis at C3-4 and C4-5. 3. Multifactorial degenerative changes with resultant multilevel foraminal narrowing as above. Notable findings include severe left with moderate right C4 foraminal stenosis, moderate bilateral C5 and C6 foraminal narrowing, with  severe left and moderate right C7 foraminal stenosis. Electronically Signed   By: Jeannine Boga M.D.   On: 05/17/2020 21:40   MR THORACIC SPINE W WO CONTRAST  Result Date: 05/17/2020 CLINICAL DATA:  Initial screening for possible infection, epidural abscess. EXAM: MRI THORACIC WITHOUT AND WITH CONTRAST TECHNIQUE: Multiplanar and multiecho pulse sequences of the thoracic spine were obtained without and with intravenous contrast. CONTRAST:  64mL GADAVIST GADOBUTROL 1 MMOL/ML IV SOLN COMPARISON:  None available. FINDINGS: Alignment: Straightening of the normal thoracic kyphosis. No listhesis. Vertebrae: Vertebral body height maintained without acute or chronic fracture. Bone marrow signal intensity within normal limits. Few scattered benign hemangiomata noted. No worrisome osseous lesions. No abnormal marrow edema or enhancement. No findings to suggest osteomyelitis discitis or septic arthritis. Cord: Normal signal and morphology. No epidural abscess or other collection. No abnormal enhancement. Paraspinal and other soft tissues: Paraspinous soft tissues demonstrate no acute finding. Small layering bilateral pleural effusions noted, left slightly larger than right. T2 hyperintense benign appearing cyst partially visualized extending from the right kidney. Visualized visceral structures otherwise grossly unremarkable. Disc levels: T1-2:  Unremarkable. T2-3: Tiny right paracentral disc extrusion with superior migration. Mild right-sided facet hypertrophy. No spinal stenosis. Foramina remain patent. T3-4: Minimal disc bulge. No spinal stenosis. Foramina remain patent. T4-5: Normal interspace. Mild right-sided facet hypertrophy. No stenosis. T5-6:  Normal interspace.  Mild facet hypertrophy.  No stenosis. T6-7:  Unremarkable. T7-8: Mild degenerative intervertebral disc space narrowing with disc bulge. No spinal stenosis. Foramina remain patent. T8-9: Mild disc bulge. No spinal stenosis. Foramina remain patent.  T9-10: Mild disc bulge with posterior element hypertrophy. No significant spinal stenosis. Mild bilateral foraminal narrowing. T10-11: Diffuse disc bulge, asymmetric to the right. Moderate facet and ligament flavum hypertrophy. Resultant mild to moderate spinal stenosis with mild cord flattening. No cord signal changes. Moderate bilateral foraminal narrowing. T11-12: Negative interspace. Bilateral facet hypertrophy. No significant spinal stenosis. Mild bilateral foraminal narrowing. T12-L1: Disc desiccation with small endplate Schmorl's node deformities. No stenosis. IMPRESSION: 1. No MRI evidence for osteomyelitis discitis or septic arthritis within the thoracic spine. No epidural abscess or other infection. 2. Disc bulging with facet hypertrophy at T10-11 with resultant mild to moderate spinal stenosis and moderate bilateral foraminal narrowing. 3. Additional mild spondylosis elsewhere within the thoracic spine without significant stenosis. 4. Small layering bilateral pleural effusions, left slightly larger than right. Electronically Signed   By: Jeannine Boga M.D.   On: 05/17/2020 21:52   MR Lumbar Spine W Wo Contrast  Result Date: 05/13/2020 CLINICAL DATA:  Fever, lumbar spine tenderness EXAM: MRI LUMBAR SPINE WITHOUT AND WITH CONTRAST TECHNIQUE: Multiplanar and multiecho pulse sequences of the lumbar spine were obtained without and with intravenous contrast. CONTRAST:  63mL GADAVIST GADOBUTROL 1 MMOL/ML IV SOLN COMPARISON:  04/03/2018 FINDINGS: Segmentation:  Standard. Alignment:  Stable. Vertebrae: There is now fusion across the L1-L2 disc space. There is no new marrow edema or enhancement. Multilevel degenerative plate irregularity and Schmorl's nodes. Conus medullaris and cauda equina: Conus extends to the L1-L2 level. Conus and cauda equina appear normal. No intrathecal enhancement. Paraspinal and other soft tissues: Mild lower lumbar paraspinal edema. No evidence of collection. Disc levels: L1-L2:  Prior right laminectomy.  Stable retrolisthesis with bridging endplate osteophytes effacing the ventral thecal sac. Similar mild to moderate foraminal stenosis. L2-L3: Disc bulge. Facet arthropathy with ligamentum flavum infolding. Slightly increased mild canal stenosis. Similar mild foraminal stenosis. L3-L4: Disc bulge. Facet arthropathy with ligamentum flavum infolding. Similar moderate canal stenosis with effacement of subarticular recesses. Similar mild to moderate left greater than right foraminal stenosis. L4-L5: Disc bulge with superimposed left subarticular/foraminal protrusion. Facet arthropathy with ligamentum flavum infolding. Similar mild canal stenosis. Similar partial effacement of the left subarticular recess. Similar mild foraminal stenosis. L5-S1: Disc bulge with endplate osteophytic ridging. Facet arthropathy. No canal stenosis. Similar mild foraminal stenosis. IMPRESSION: No evidence of discitis/osteomyelitis. Multilevel degenerative changes as detailed above without substantial change since the prior examination. Electronically Signed   By: Macy Mis M.D.   On: 05/13/2020 14:25   CT ABDOMEN PELVIS W CONTRAST  Result Date: 05/15/2020 CLINICAL DATA:  Sepsis.  Fever of unknown origin. EXAM: CT ABDOMEN AND PELVIS WITH CONTRAST TECHNIQUE: Multidetector CT imaging of the abdomen and pelvis was performed using the standard protocol following bolus administration of intravenous contrast. CONTRAST:  164mL OMNIPAQUE IOHEXOL 300 MG/ML  SOLN COMPARISON:  04/02/2020.  Chest CTA obtained earlier today. FINDINGS: Lower chest: Progressive pulmonary embolism on the right, with emboli currently demonstrated in the distal right main pulmonary artery and significantly more clot in the intersegmental and proximal right lower lobe pulmonary arteries. Bilateral lung densities and atelectasis with improvement since this morning. Hepatobiliary: Diffuse low density of the liver relative to the spleen. Normal  appearing gallbladder. Pancreas: Unremarkable. No pancreatic ductal dilatation or surrounding inflammatory changes. Spleen: Normal in size without focal abnormality. Adrenals/Urinary Tract: Normal appearing adrenal glands. Bilateral renal cysts. Multiple small bilateral renal calculi. No bladder or ureteral calculi are seen. Excreted contrast in the urinary bladder from the CTA earlier today. Stomach/Bowel: Multiple colonic diverticula without evidence of diverticulitis. Unremarkable stomach, small bowel and appendix. Vascular/Lymphatic: Atheromatous arterial calcifications without aneurysm. No enlarged lymph nodes. Two renal arteries on the left. Reproductive: Mildly enlarged prostate gland. Other: Small bilateral inguinal hernias containing fat and very small umbilical artery containing fat. Musculoskeletal: Stable lumbar and lower thoracic spine degenerative changes and fusion of the L1 and L2 vertebral bodies. IMPRESSION: 1. Progressive pulmonary embolism on the right, with emboli currently demonstrated in the distal right main pulmonary artery and significantly more clot in the intersegmental and proximal right lower lobe pulmonary arteries. These results will be called to the ordering clinician or representative by the Radiologist Assistant, and communication documented in the PACS or [employer]. 2. Lung densities and atelectasis with improvement since this morning. 3. Diffuse hepatic steatosis. 4. Multiple small, nonobstructing bilateral renal calculi. 5. Colonic diverticulosis. Electronically Signed   By: Claudie Revering M.D.   On: 05/15/2020 22:17   VAS [place] IVC/ILIAC (VENOUS ONLY)  Result Date: 05/11/2020 IVC/ILIAC STUDY Indications: DVT, recent COVID Limitations: Air/bowel gas.  Comparison Study: No prior study Performing Technologist: Maudry Mayhew MHA, RDMS, RVT, RDCS  Examination Guidelines: A complete evaluation includes B-mode imaging, spectral Doppler, color Doppler, and power Doppler as  needed of all accessible portions of each vessel. Bilateral testing is considered an integral part of a complete examination. Limited examinations for reoccurring indications may be performed as noted.  IVC/Iliac Findings: +----------+------+--------+--------+    IVC    PatentThrombusComments +----------+------+--------+--------+ IVC Mid   patent                 +----------+------+--------+--------+ IVC Distalpatent                 +----------+------+--------+--------+  +-------------------+---------+-----------+---------+-----------+--------+  CIV        RT-PatentRT-ThrombusLT-PatentLT-ThrombusComments +-------------------+---------+-----------+---------+-----------+--------+ Common Iliac Prox                       patent                      +-------------------+---------+-----------+---------+-----------+--------+ Common Iliac Mid                        patent                      +-------------------+---------+-----------+---------+-----------+--------+ Common Iliac Distal patent              patent                      +-------------------+---------+-----------+---------+-----------+--------+  +-------------------------+---------+-----------+---------+-----------+--------+            EIV           RT-PatentRT-ThrombusLT-PatentLT-ThrombusComments +-------------------------+---------+-----------+---------+-----------+--------+ External Iliac Vein Prox                      patent                      +-------------------------+---------+-----------+---------+-----------+--------+ External Iliac Vein       patent                                          Distal                                                                    +-------------------------+---------+-----------+---------+-----------+--------+   Summary: IVC/Iliac: No evidence of thrombus in IVC and Iliac veins.  *See table(s) above for measurements and observations.   Electronically signed by Harold Barban MD on 05/11/2020 at 9:55:50 PM.    Final    DG Chest Port 1 View  Result Date: 05/25/2020 CLINICAL DATA:  75 year old male with concern for sepsis. EXAM: PORTABLE CHEST 1 VIEW COMPARISON:  Chest CT dated 05/14/2020. FINDINGS: Diffuse bilateral streaky interstitial densities worsened since 05/09/2020 but relatively similar to 05/10/2020 concerning for residual infiltrate, likely on the background of chronic changes. Clinical correlation and follow-up recommended. No lobar consolidation, pleural effusion, pneumothorax. The cardiac silhouette is within limits. No acute osseous pathology. IMPRESSION: Persistent bilateral infiltrates. Electronically Signed   By: Anner Crete M.D.   On: 05/25/2020 16:24   DG Chest Port 1 View  Result Date: 05/10/2020 CLINICAL DATA:  Short of breath, history of COPD EXAM: PORTABLE CHEST 1 VIEW COMPARISON:  Prior chest x-ray dated yesterday FINDINGS: Stable cardiac and mediastinal contours. Marked interval increase in diffuse interstitial prominence bilaterally now with Kerley B-lines in the periphery. Findings are most consistent with worsening CHF. No large effusion or pneumothorax. No acute osseous abnormality. IMPRESSION: Increasing interstitial pulmonary edema concerning for progressive CHF. Electronically Signed   By: Jacqulynn Cadet M.D.   On: 05/10/2020 07:54   DG Chest Port 1 View  Result Date: 05/09/2020 CLINICAL DATA:  Possible sepsis Fever and cough EXAM: PORTABLE CHEST 1 VIEW COMPARISON:  04/04/2020 FINDINGS: Heart size within normal limits.  No pulmonary vascular congestion. Interval improvement in aeration of the lungs with bilateral airspace opacities still remaining. IMPRESSION: Interval improvement in aeration of the lungs with bilateral opacities still remaining consistent with resolving pneumonia. Electronically Signed   By: Miachel Roux M.D.   On: 05/09/2020 12:56   VAS [place] LOWER EXTREMITY VENOUS (DVT)  Result Date:  05/11/2020  Lower Venous DVT Study Indications: Edema, Recent Covid infection, and SOB.  Comparison Study: 03/31/20 negative bilateral lower extremity venous duplex Performing Technologist: Maudry Mayhew MHA, RDMS, RVT, RDCS  Examination Guidelines: A complete evaluation includes B-mode imaging, spectral Doppler, color Doppler, and power Doppler as needed of all accessible portions of each vessel. Bilateral testing is considered an integral part of a complete examination. Limited examinations for reoccurring indications may be performed as noted. The reflux portion of the exam is performed with the patient in reverse Trendelenburg.  +---------+---------------+---------+-----------+----------+--------------+ RIGHT    CompressibilityPhasicitySpontaneityPropertiesThrombus Aging +---------+---------------+---------+-----------+----------+--------------+ CFV      Full           Yes      Yes                                 +---------+---------------+---------+-----------+----------+--------------+ SFJ      Full                                                        +---------+---------------+---------+-----------+----------+--------------+ FV Prox  Full                                                        +---------+---------------+---------+-----------+----------+--------------+ FV Mid   Full                                                        +---------+---------------+---------+-----------+----------+--------------+ FV DistalFull                                                        +---------+---------------+---------+-----------+----------+--------------+ PFV      Full                                                        +---------+---------------+---------+-----------+----------+--------------+ POP      Full           Yes      Yes                                 +---------+---------------+---------+-----------+----------+--------------+ PTV       Full                                                        +---------+---------------+---------+-----------+----------+--------------+  PERO     Full                                                        +---------+---------------+---------+-----------+----------+--------------+ Gastroc  None                    No                   Acute          +---------+---------------+---------+-----------+----------+--------------+   +---------+---------------+---------+-----------+----------+--------------+ LEFT     CompressibilityPhasicitySpontaneityPropertiesThrombus Aging +---------+---------------+---------+-----------+----------+--------------+ CFV      Full           Yes      Yes                                 +---------+---------------+---------+-----------+----------+--------------+ SFJ      Full                                                        +---------+---------------+---------+-----------+----------+--------------+ FV Prox  None                    No                   Acute          +---------+---------------+---------+-----------+----------+--------------+ FV Mid   None                    No                   Acute          +---------+---------------+---------+-----------+----------+--------------+ FV DistalNone                    No                   Acute          +---------+---------------+---------+-----------+----------+--------------+ PFV      Full                                                        +---------+---------------+---------+-----------+----------+--------------+ POP      None                    No                   Acute          +---------+---------------+---------+-----------+----------+--------------+ PTV      None                    No                   Acute          +---------+---------------+---------+-----------+----------+--------------+ PERO     None  No                    Acute          +---------+---------------+---------+-----------+----------+--------------+     Summary: RIGHT: - Findings consistent with acute deep vein thrombosis involving the right gastrocnemius veins. - No cystic structure found in the popliteal fossa.  LEFT: - Findings consistent with acute deep vein thrombosis involving the left femoral vein, left popliteal vein, left posterior tibial veins, and left peroneal veins. - No cystic structure found in the popliteal fossa.  *See table(s) above for measurements and observations. Electronically signed by Harold Barban MD on 05/11/2020 at 9:56:21 PM.    Final

## 2020-05-26 NOTE — Progress Notes (Signed)
[place] for heparin Indication: pulmonary embolus  Allergies  Allergen Reactions  . Cephalexin Shortness Of Breath, Rash and Other (See Comments)    Tolerated Augmentin 2015 and 2017 (12-2017). Tolerated nafcillin 12/2017 PATIENT HAS HAD A PCN REACTION WITH IMMEDIATE RASH, FACIAL/TONGUE/THROAT SWELLING, SOB, OR LIGHTHEADEDNESS WITH HYPOTENSION:  #  #  YES  #  #  Has patient had a PCN reaction causing severe rash involving mucus membranes or skin necrosis: No Has patient had a PCN reaction that required hospitalization: No Has patient had a PCN reaction occurring within the last 10 years: No If all of the above answers are "NO", then may proceed  . Clarithromycin Shortness Of Breath and Rash  . Certolizumab Pegol Hives  . Tocilizumab Hives  . Doxycycline Rash  . Hydroxyzine Rash  . Lidoderm Other (See Comments)    "made me act weird"  . Pyrithione Zinc Rash    Patient Measurements: Height: 6\' 2"  (188 cm) Weight: 88 kg (194 lb 0.1 oz) (Per 4/13 note) IBW/kg (Calculated) : 82.2 Heparin Dosing Weight: TBW  Vital Signs: Temp: 97.2 F (36.2 C) (04/21 0000) Temp Source: Oral (04/21 0000) BP: 122/91 (04/21 0300) Pulse Rate: 86 (04/21 0300)  Labs: Recent Labs    05/25/20 1547 05/25/20 1626 05/25/20 1931 05/25/20 2231 05/26/20 0141  HGB 11.5* 12.2*  --   --  10.1*  HCT 36.6* 36.0*  --   --  32.2*  PLT 155  --   --   --  126*  APTT 34  --   --   --  75*  LABPROT 19.8*  --   --   --   --   INR 1.7*  --   --   --   --   HEPARINUNFRC  --   --   --   --  >1.10*  CREATININE 0.90 0.80  --   --   --   TROPONINIHS  --   --  67* 46*  --     Estimated Creatinine Clearance: 94.2 mL/min (by C-G formula based on SCr of 0.8 mg/dL).   Medical History: Past Medical History:  Diagnosis Date  . Allergy   . Barrett's esophageal ulceration   . BPH (benign prostatic hyperplasia)   . CAD (coronary artery disease)    sees Dr. Mar Daring  .  Cataract    both eyes removed   . Colonic polyp   . Concussion with loss of consciousness of 30 minutes or less   . Contact dermatitis   . Contact with or exposure to venereal diseases   . COPD (chronic obstructive pulmonary disease) ([place])    sees Dr. Kara Mead   . Diverticulosis of colon   . Dysphagia   . GERD (gastroesophageal reflux disease)    past hx- on meds   . HNP (herniated nucleus pulposus), lumbar    recurrent  . Hyperlipidemia   . Hypertension   . Myocardial infarction [facility])    2011- stent placed  . Nephrolithiasis   . Rheumatoid arthritis [facility])    sees Dr. Gavin Pound   . SOB (shortness of breath)    Assessment: 29 YOM presenting with worsening SOB and fever, previously admitted with new PE and discharged on Eliquis w/7d load, now on 5mg  BID.  Discussed with MD, will not bolus but will start gtt with concern for worsening PE, was supratherapeutic on 1400 units/hr and decreased to 1250 units/hr so will start there.  4/21 AM update: Initial aPTT is therapeutic   Goal of Therapy:  Heparin level 0.3-0.7 units/ml aPTT 66-102 seconds Monitor platelets by anticoagulation protocol: Yes   Plan:  Cont heparin 1250 units/hr Confirmatory aPTT at [place], PharmD, [place] Pharmacist Phone: [phone]

## 2020-05-26 NOTE — Progress Notes (Signed)
Pt transferred from 4E01 to 4e16 in order to have negative pressure room.  Belongings transferred.  Pt notified family.  CCMD notified as well.

## 2020-05-26 NOTE — Progress Notes (Signed)
[place] for heparin Indication: pulmonary embolus  Allergies  Allergen Reactions  . Cephalexin Shortness Of Breath, Rash and Other (See Comments)    Tolerated Augmentin 2015 and 2017 (12-2017). Tolerated nafcillin 12/2017 PATIENT HAS HAD A PCN REACTION WITH IMMEDIATE RASH, FACIAL/TONGUE/THROAT SWELLING, SOB, OR LIGHTHEADEDNESS WITH HYPOTENSION:  #  #  YES  #  #  Has patient had a PCN reaction causing severe rash involving mucus membranes or skin necrosis: No Has patient had a PCN reaction that required hospitalization: No Has patient had a PCN reaction occurring within the last 10 years: No If all of the above answers are "NO", then may proceed  . Clarithromycin Shortness Of Breath and Rash  . Certolizumab Pegol Hives  . Tocilizumab Hives  . Doxycycline Rash  . Hydroxyzine Rash  . Lidoderm Other (See Comments)    "made me act weird"  . Pyrithione Zinc Rash    Patient Measurements: Height: 6\' 2"  (188 cm) Weight: 88 kg (194 lb 0.1 oz) (Per 4/13 note) IBW/kg (Calculated) : 82.2 Heparin Dosing Weight: TBW  Vital Signs: Temp: 97.4 F (36.3 C) (04/21 1207) Temp Source: Oral (04/21 1207) BP: 129/92 (04/21 1207) Pulse Rate: 84 (04/21 1207)  Labs: Recent Labs    05/25/20 1547 05/25/20 1626 05/25/20 1931 05/25/20 2231 05/26/20 0141 05/26/20 1303  HGB 11.5* 12.2*  --   --  10.1*  --   HCT 36.6* 36.0*  --   --  32.2*  --   PLT 155  --   --   --  126*  --   APTT 34  --   --   --  75* 128*  LABPROT 19.8*  --   --   --   --   --   INR 1.7*  --   --   --   --   --   HEPARINUNFRC  --   --   --   --  >1.10*  --   CREATININE 0.90 0.80  --   --   --   --   TROPONINIHS  --   --  67* 46*  --   --     Estimated Creatinine Clearance: 94.2 mL/min (by C-G formula based on SCr of 0.8 mg/dL).   Medical History: Past Medical History:  Diagnosis Date  . Allergy   . Barrett's esophageal ulceration   . BPH (benign prostatic hyperplasia)   .  CAD (coronary artery disease)    sees Dr. Mar Daring  . Cataract    both eyes removed   . Colonic polyp   . Concussion with loss of consciousness of 30 minutes or less   . Contact dermatitis   . Contact with or exposure to venereal diseases   . COPD (chronic obstructive pulmonary disease) ([place])    sees Dr. Kara Mead   . Diverticulosis of colon   . Dysphagia   . GERD (gastroesophageal reflux disease)    past hx- on meds   . HNP (herniated nucleus pulposus), lumbar    recurrent  . Hyperlipidemia   . Hypertension   . Myocardial infarction [facility])    2011- stent placed  . Nephrolithiasis   . Rheumatoid arthritis [facility])    sees Dr. Gavin Pound   . SOB (shortness of breath)    Assessment: 78 YOM presenting with worsening SOB and fever, previously admitted with new PE and discharged on Eliquis w/7d load, now on 5mg  BID.  Pharmacy dosing heparin  -  aPTT= 128   Goal of Therapy:  Heparin level 0.3-0.7 units/ml aPTT 66-102 seconds Monitor platelets by anticoagulation protocol: Yes   Plan:  -Decrease heparin to 1050 units/hr -Heparin level in 8 hours and daily wth CBC daily  Hildred Laser, PharmD Clinical Pharmacist **Pharmacist phone directory can now be found on [email or web address] ([email or web address]).  Listed under [place].

## 2020-05-26 NOTE — Progress Notes (Signed)
  Echocardiogram 2D Echocardiogram with definity has been performed.  Tyrone Roman M 05/26/2020, 1:57 PM

## 2020-05-26 NOTE — Consult Note (Addendum)
-  NAME:  Tyrone Roman, MRN:  [id number], DOB:  1945/03/26, LOS: 1 ADMISSION DATE:  05/25/2020, CONSULTATION DATE:  05/26/2020 REFERRING MD:  Dr. Sloan Roman, CHIEF COMPLAINT:   Acute on chronic hypoxia   History of Present Illness:  Tyrone Roman is a 75 y.o. male with PMH significant for CAD with prior MI s/p PCI with DES, Stroke, COPD, acute on chronic hypoxic respiratory failure on 2L [place] at baseline, recent COVID PNA, HTN, HLD, RA (previosuly on Rituxan), BPH prior lumbar surgery with herniated nucleus pulposis, Barrett's esophageal ulceration, and dysphagia who presented to the ED with complaints of acute worsening hypoxia. He denies any other acute complaints on admission.   Time line of previous events/hospitilizations:  1/24 - 1/26 First admission for COVID PNA, in an immunocompromised patient, PCR positive,  she received IV steroids and remdesivir but was unable to use baricitinib due to underling immunosuppressants. Seen with Coag-neg staph bacteremia, ID seen with no reccs for ABT's  2/10 - 2/11 Readmitted recurrent SOB/hypoxia with substernal CP. Concern that steroid taper was completed to quickly, patient was placed on 10 day taper.  2/18 - 2/25 Readmitted for recurrent SOB/hypoxia. COVID PCR remained positive. Negative for PE. Received zosyn/vanc with switch to PO Augmentin at d/c. Treated with short steroid taper   2/27 - 3/2 Readmitted and treated for SOB with fever and AMS. ? Aspiration PNA and received empiric Unasyn and switched to Augmentin on d/c  4/4 - 4/13 Readmitted from [facility] place for hypoxic respiratory failure to be due to acute on chronic congestive heart failure. COVID PCR positive.He received broad spectrum ABT which were eventually d/c due to no obvious cause. Concern for possible vasculities that temporary resolves with steroids. Encephalopathy and fevers resolve with moderate dose steroids. D/C'd on 30mg  BID prednisone and Eliquis for new acute PE seen on CTA  chest  Current admission patient continues to remain COVID positive by PCR. CTA chest reveals progressive PE no extending from the central right pulmonary artery ino the lobar segments. Patient states he has continued on high dose prednisone of 30mg  BID since d/c and has not missed any doses. Also denies any missed Eliquis doses.   Pertinent  Medical History  CAD with prior MI s/p PCI with DES, Stroke, COPD, acute on chronic hypoxic respiratory failure on 2L [place] at baseline, recent COVID PNA, HTN, HLD, RA (previosuly on Rituxan), BPH prior lumbar surgery with herniated nucleus pulposis, Barrett's esophageal ulceration, and dysphagia  Significant Hospital Events: Including procedures, antibiotic start and stop dates in addition to other pertinent events   . See above   Interim History / Subjective:  Seen sitting up in bed in no acute distress States he continues to feel poor with continued dyspnea and weaknes  Objective   Blood pressure (!) 129/92, pulse 84, temperature (!) 97.4 F (36.3 C), temperature source Oral, resp. rate (!) 22, height 6\' 2"  (1.88 m), weight 88 kg, SpO2 96 %.        Intake/Output Summary (Last 24 hours) at 05/26/2020 1333 Last data filed at 05/26/2020 0700 Gross per 24 hour  Intake 2311.45 ml  Output --  Net 2311.45 ml   Filed Weights   05/25/20 1538  Weight: 88 kg    Examination: General: Acute on chronic ill appearing elderly male lying in bed, in NAD HEENT: [place]/AT, MM pink/moist, PERRL,  Neuro: Alert and oriented x3, non-focal, deconditioned  CV: s1s2 regular rate and rhythm, no murmur, rubs, or gallops,  PULM:  Diminished air entry bilaterally, no added breath sounds bilaterally, oxygen saturations appropriate on 10l HFNC  GI: soft, bowel sounds active in all 4 quadrants, non-tender, non-distended Extremities: warm/dry, no edema  Skin: no rashes or lesions  Labs/imaging that I havepersonally reviewed   4/2  bilateral lower extremity Doppler: Left  popliteal vein, left posterior tibial vein, left peroneal vein DVT  4/6 bilateral lower extremity Doppler: Bilateral lower extremity DVT  4/9 SARS-COV-2 antibody-IgG: > Negative  4/9 Serum cryptococcal/histoplasma/blastomycosis antigen: > Negative  4/9 ANA > Negative  4/9  EBV IgM > Negative  4/9 GOLD QuantiFERON > Negative  4/9 CMV IgG/IgM > Negative  4/10  RA factor > 12.9 (WNL)  4/10 ACE > 34 (WNL)  4/12 SPEP > No M spike  4/13>> MPO/ANCA: Negative  4/20 CTA chest  Pulmonary artery embolus extending from the central righ pulmonary artery into the lobar and segmental branches of the right lower lobe and right upper lobe, new since the prior CT. No CT evidence of right heart straining.  Resolved Hospital Problem list     Assessment & Plan:  Acute on Chronic Hypoxic Respiratory Failure  -Currently requiring 10L HFNC Acute vs prolonged COVID infection in the setting of chronic immunosuppression -PCR remains positive since original swab 1/24 New Pulmonary embolism despite adequate anticoagulation with Eliquis  -Seen with progressive PE on repeat CT 4/20  Bilateral pulmonary infiltrates  Hx of COPD  P: Continue supplemental oxygen for stats greater than 90 Head of bed elevated 30 degrees. Ensure adequate pulmonary hygiene  Follow cultures  ID following and recommending repeat treatment with Remdesivir and IV steroids  Currently requiring too much supplemental oxygen to safely bronch  Continue anticoagulation  Hold further antibiotics acutely  Follow up on fungitell  Given patients prolonged illness with complicating underlying immunosuppression consideration needs to be given to non conventional therapies. On investigation there has been benefit seen to the use of IVIG in this specific situation. See link below for further discussion.  [email or web address]  Will discuss with all rounding teams to determine potential for use of IVIG  vs convalescent plasma vs monoclonal antibody therapy.   Best practice   Per primary   Labs   CBC: Recent Labs  Lab 05/25/20 1547 05/25/20 1626 05/26/20 0141  WBC 11.6*  --  7.9  NEUTROABS 10.9*  --   --   HGB 11.5* 12.2* 10.1*  HCT 36.6* 36.0* 32.2*  MCV 89.3  --  90.2  PLT 155  --  126*    Basic Metabolic Panel: Recent Labs  Lab 05/25/20 1547 05/25/20 1626 05/25/20 2231  NA 129* 128*  --   K 4.6 4.7  --   CL 98 99  --   CO2 21*  --   --   GLUCOSE 225* 228*  --   BUN 21 21  --   CREATININE 0.90 0.80  --   CALCIUM 9.5  --   --   MG  --   --  1.7   GFR: Estimated Creatinine Clearance: 94.2 mL/min (by C-G formula based on SCr of 0.8 mg/dL). Recent Labs  Lab 05/25/20 1547 05/25/20 1556 05/25/20 2231 05/26/20 0141  PROCALCITON  --   --  <0.10  --   WBC 11.6*  --   --  7.9  LATICACIDVEN  --  3.1* 2.6*  --     Liver Function Tests: Recent Labs  Lab 05/25/20 1547  AST 29  ALT 27  ALKPHOS 76  BILITOT 0.8  PROT 5.3*  ALBUMIN 2.3*   No results for input(s): LIPASE, AMYLASE in the last 168 hours. No results for input(s): AMMONIA in the last 168 hours.  ABG    Component Value Date/Time   PHART 7.369 06/08/2009 1024   PCO2ART 40.7 06/08/2009 1024   PO2ART 73.0 (L) 06/08/2009 1024   HCO3 24.7 04/02/2020 2145   TCO2 21 (L) 05/25/2020 1626   ACIDBASEDEF 1.0 06/08/2009 1029   O2SAT 81.0 04/02/2020 2145     Coagulation Profile: Recent Labs  Lab 05/25/20 1547  INR 1.7*    Cardiac Enzymes: No results for input(s): CKTOTAL, CKMB, CKMBINDEX, TROPONINI in the last 168 hours.  HbA1C: Hgb A1c MFr Bld  Date/Time Value Ref Range Status  05/12/2020 07:41 AM 6.3 (H) 4.8 - 5.6 % Final    Comment:    (NOTE) Pre diabetes:          5.7%-6.4%  Diabetes:              >6.4%  Glycemic control for   <7.0% adults with diabetes   04/01/2020 04:21 AM 7.7 (H) 4.8 - 5.6 % Final    Comment:    (NOTE) Pre diabetes:          5.7%-6.4%  Diabetes:               >6.4%  Glycemic control for   <7.0% adults with diabetes     CBG: No results for input(s): GLUCAP in the last 168 hours.  Review of Systems:   Please see the history of present illness. All other systems reviewed and are negative   Past Medical History:  He,  has a past medical history of Allergy, Barrett's esophageal ulceration, BPH (benign prostatic hyperplasia), CAD (coronary artery disease), Cataract, Colonic polyp, Concussion with loss of consciousness of 30 minutes or less, Contact dermatitis, Contact with or exposure to venereal diseases, COPD (chronic obstructive pulmonary disease) ([place]), Diverticulosis of colon, Dysphagia, GERD (gastroesophageal reflux disease), HNP (herniated nucleus pulposus), lumbar, Hyperlipidemia, Hypertension, Myocardial infarction ([place]), Nephrolithiasis, Rheumatoid arthritis ([place]), and SOB (shortness of breath).   Surgical History:   Past Surgical History:  Procedure Laterality Date  . BACK SURGERY    . CARDIAC CATHETERIZATION N/A 01/19/2016   Procedure: Left Heart Cath and Coronary Angiography;  Surgeon: Burnell Blanks, MD;  Location: [place] [facility];  Service: Cardiovascular;  Laterality: N/A;  . COLONOSCOPY  12/09/2014   per Dr. Silverio Decamp, adenomatous polyps, repeat 3 years   . CORONARY ANGIOPLASTY WITH STENT PLACEMENT    . ESOPHAGOGASTRODUODENOSCOPY (EGD) WITH ESOPHAGEAL DILATION  02/17/09   Barretts esophagus   . EYE SURGERY    . KNEE ARTHROSCOPY Left X 2  . LUMBAR LAMINECTOMY/DECOMPRESSION MICRODISCECTOMY Right 10/07/2017   Procedure: RIGHT LUMBAR ONE-TWO LAMINECTOMY WITH MICRODISCECTOMY;  Surgeon: Consuella Lose, MD;  Location: [place];  Service: Neurosurgery;  Laterality: Right;  . LUMBAR LAMINECTOMY/DECOMPRESSION MICRODISCECTOMY N/A 12/06/2017   Procedure: RECURRENT MICRODISCECTOMY LUMBAR ONE - LUMBAR TWO;  Surgeon: Consuella Lose, MD;  Location: [place];  Service: Neurosurgery;  Laterality: N/A;  . LUMBAR WOUND DEBRIDEMENT N/A  12/11/2017   Procedure: LUMBAR WOUND DEBRIDEMENT;  Surgeon: Consuella Lose, MD;  Location: [place];  Service: Neurosurgery;  Laterality: N/A;  . POLYPECTOMY    . TONSILLECTOMY    . UPPER GASTROINTESTINAL ENDOSCOPY       Social History:   reports that he quit smoking about 31 years ago. His smoking use included cigarettes. He has a 20.00 pack-year smoking history. He  has never used smokeless tobacco. He reports previous alcohol use. He reports that he does not use drugs.   Family History:  His family history includes Arthritis in his father and mother; Cancer in his maternal grandmother; Colon cancer in his father and paternal uncle; Colon polyps in his mother and sister; Dementia in his mother; Diabetes in his paternal uncle; Heart attack in his father; Melanoma in his mother and paternal uncle; Prostate cancer in his father; Skin cancer in his daughter; Stomach cancer in his paternal uncle. There is no history of Esophageal cancer or Rectal cancer.   Allergies Allergies  Allergen Reactions  . Cephalexin Shortness Of Breath, Rash and Other (See Comments)    Tolerated Augmentin 2015 and 2017 (12-2017). Tolerated nafcillin 12/2017 PATIENT HAS HAD A PCN REACTION WITH IMMEDIATE RASH, FACIAL/TONGUE/THROAT SWELLING, SOB, OR LIGHTHEADEDNESS WITH HYPOTENSION:  #  #  YES  #  #  Has patient had a PCN reaction causing severe rash involving mucus membranes or skin necrosis: No Has patient had a PCN reaction that required hospitalization: No Has patient had a PCN reaction occurring within the last 10 years: No If all of the above answers are "NO", then may proceed  . Clarithromycin Shortness Of Breath and Rash  . Certolizumab Pegol Hives  . Tocilizumab Hives  . Doxycycline Rash  . Hydroxyzine Rash  . Lidoderm Other (See Comments)    "made me act weird"  . Pyrithione Zinc Rash     Home Medications  Prior to Admission medications   Medication Sig Start Date End Date Taking? Authorizing Provider   acetaminophen (TYLENOL) 325 MG tablet Take 650 mg by mouth every 6 (six) hours as needed for headache, mild pain or fever.    [provider]  albuterol (VENTOLIN HFA) 108 (90 Base) MCG/ACT inhaler Inhale 2 puffs into the lungs every 4 (four) hours as needed for wheezing or shortness of breath. 05/18/20   Ghimire, Henreitta Leber, MD  APIXABAN [place]) VTE STARTER PACK (10MG  AND 5MG ) Take as directed on package: start with two-5mg  tablets twice daily for 7 days. On day 8, switch to one-5mg  tablet twice daily. 05/18/20   Ghimire, Henreitta Leber, MD  budesonide-formoterol (SYMBICORT) 160-4.5 MCG/ACT inhaler Inhale 2 puffs into the lungs in the morning and at bedtime. 05/18/20 06/17/20  Ghimire, Henreitta Leber, MD  lactose free nutrition (BOOST PLUS) LIQD Take 237 mLs by mouth 3 (three) times daily with meals. 05/18/20 06/17/20  Ghimire, Henreitta Leber, MD  melatonin 5 MG TABS Take 5 mg by mouth at bedtime.    [provider]  Menthol (ICY HOT) 5 % PTCH Apply 1 patch topically 2 (two) times daily. Apply to lower back.    [provider]  metoprolol tartrate (LOPRESSOR) 25 MG tablet Take 1 tablet (25 mg total) by mouth 2 (two) times daily. 05/18/20   Ghimire, Henreitta Leber, MD  omeprazole (PRILOSEC) 40 MG capsule Take 1 capsule (40 mg total) by mouth daily. 05/18/20   Ghimire, Henreitta Leber, MD  predniSONE (DELTASONE) 10 MG tablet Take 3 tablets (30 mg total) by mouth every 12 (twelve) hours. 05/18/20   Ghimire, Henreitta Leber, MD  simvastatin (ZOCOR) 20 MG tablet Take 1 tablet (20 mg total) by mouth daily. 05/18/20   Ghimire, Henreitta Leber, MD  tamsulosin (FLOMAX) 0.4 MG CAPS capsule Take 1 capsule (0.4 mg total) by mouth daily. 05/19/20   Ghimire, Henreitta Leber, MD  Tiotropium Bromide Monohydrate (SPIRIVA RESPIMAT) 2.5 MCG/ACT AERS Inhale 2 puffs into the  lungs daily. 05/18/20   Ghimire, Henreitta Leber, MD  Tiotropium Bromide Monohydrate (SPIRIVA RESPIMAT) 2.5 MCG/ACT AERS Inhale 2 puffs into the lungs daily. 05/24/20   Margaretha Seeds, MD     Signature:   Johnsie Cancel, NP-C [facility] Personal contact information can be found on Amion  05/26/2020, 4:34 PM

## 2020-05-27 ENCOUNTER — Other Ambulatory Visit (HOSPITAL_COMMUNITY): Payer: Self-pay

## 2020-05-27 ENCOUNTER — Ambulatory Visit: Payer: Medicare Other | Admitting: Family Medicine

## 2020-05-27 DIAGNOSIS — Z7952 Long term (current) use of systemic steroids: Secondary | ICD-10-CM

## 2020-05-27 DIAGNOSIS — I2699 Other pulmonary embolism without acute cor pulmonale: Secondary | ICD-10-CM | POA: Diagnosis not present

## 2020-05-27 DIAGNOSIS — U071 COVID-19: Secondary | ICD-10-CM | POA: Diagnosis not present

## 2020-05-27 DIAGNOSIS — R778 Other specified abnormalities of plasma proteins: Secondary | ICD-10-CM | POA: Diagnosis not present

## 2020-05-27 DIAGNOSIS — J9621 Acute and chronic respiratory failure with hypoxia: Secondary | ICD-10-CM

## 2020-05-27 DIAGNOSIS — J9601 Acute respiratory failure with hypoxia: Secondary | ICD-10-CM | POA: Diagnosis not present

## 2020-05-27 DIAGNOSIS — D84821 Immunodeficiency due to drugs: Secondary | ICD-10-CM

## 2020-05-27 DIAGNOSIS — Z7901 Long term (current) use of anticoagulants: Secondary | ICD-10-CM | POA: Diagnosis not present

## 2020-05-27 DIAGNOSIS — I2694 Multiple subsegmental pulmonary emboli without acute cor pulmonale: Secondary | ICD-10-CM | POA: Diagnosis not present

## 2020-05-27 LAB — BRAIN NATRIURETIC PEPTIDE: B Natriuretic Peptide: 212.1 pg/mL — ABNORMAL HIGH (ref 0.0–100.0)

## 2020-05-27 LAB — CBC
HCT: 38.1 % — ABNORMAL LOW (ref 39.0–52.0)
Hemoglobin: 12.6 g/dL — ABNORMAL LOW (ref 13.0–17.0)
MCH: 31.5 pg (ref 26.0–34.0)
MCHC: 33.1 g/dL (ref 30.0–36.0)
MCV: 95.3 fL (ref 80.0–100.0)
Platelets: 203 10*3/uL (ref 150–400)
RBC: 4 MIL/uL — ABNORMAL LOW (ref 4.22–5.81)
RDW: 12 % (ref 11.5–15.5)
WBC: 8 10*3/uL (ref 4.0–10.5)
nRBC: 0 % (ref 0.0–0.2)

## 2020-05-27 LAB — COMPREHENSIVE METABOLIC PANEL
ALT: 27 U/L (ref 0–44)
AST: 34 U/L (ref 15–41)
Albumin: 3.3 g/dL — ABNORMAL LOW (ref 3.5–5.0)
Alkaline Phosphatase: 58 U/L (ref 38–126)
Anion gap: 6 (ref 5–15)
BUN: 16 mg/dL (ref 8–23)
CO2: 28 mmol/L (ref 22–32)
Calcium: 8.9 mg/dL (ref 8.9–10.3)
Chloride: 99 mmol/L (ref 98–111)
Creatinine, Ser: 0.8 mg/dL (ref 0.61–1.24)
GFR, Estimated: >60 mL/min (ref >60)
Glucose, Bld: 110 mg/dL — ABNORMAL HIGH (ref 70–99)
Potassium: 4.8 mmol/L (ref 3.5–5.1)
Sodium: 133 mmol/L — ABNORMAL LOW (ref 135–145)
Total Bilirubin: 0.8 mg/dL (ref 0.3–1.2)
Total Protein: 6.2 g/dL — ABNORMAL LOW (ref 6.5–8.1)

## 2020-05-27 LAB — EXPECTORATED SPUTUM ASSESSMENT W GRAM STAIN, RFLX TO RESP C

## 2020-05-27 LAB — C-REACTIVE PROTEIN: CRP: 3.9 mg/dL — ABNORMAL HIGH (ref <1.0)

## 2020-05-27 LAB — APTT
aPTT: 29 seconds (ref 24–36)
aPTT: 58 seconds — ABNORMAL HIGH (ref 24–36)
aPTT: 79 seconds — ABNORMAL HIGH (ref 24–36)

## 2020-05-27 LAB — SAR COV2 SEROLOGY (COVID19)AB(IGG),IA: SARS-CoV-2 Ab, IgG: NONREACTIVE

## 2020-05-27 LAB — HEPARIN LEVEL (UNFRACTIONATED): Heparin Unfractionated: <0.1 IU/mL — ABNORMAL LOW (ref 0.30–0.70)

## 2020-05-27 MED ORDER — FUROSEMIDE 10 MG/ML IJ SOLN
20.0000 mg | Freq: Once | INTRAMUSCULAR | Status: AC
  Administered 2020-05-27: 20 mg via INTRAVENOUS
  Filled 2020-05-27: qty 2

## 2020-05-27 NOTE — Progress Notes (Signed)
-  NAME:  Tyrone Roman, MRN:  [id number], DOB:  1945/07/14, LOS: 2 ADMISSION DATE:  05/25/2020, CONSULTATION DATE:  05/26/2020 REFERRING MD:  Dr. Sloan Leiter, CHIEF COMPLAINT:   Acute on chronic hypoxia   History of Present Illness:  Tyrone Roman is a 75 y.o. male with PMH significant for CAD with prior MI s/p PCI with DES, Stroke, COPD, acute on chronic hypoxic respiratory failure on 2L [place] at baseline, recent COVID PNA, HTN, HLD, RA (previosuly on Rituxan), BPH prior lumbar surgery with herniated nucleus pulposis, Barrett's esophageal ulceration, and dysphagia who presented to the ED with complaints of acute worsening hypoxia. He denies any other acute complaints on admission.   Time line of previous events/hospitilizations:  1/24 - 1/26 First admission for COVID PNA, in an immunocompromised patient, PCR positive,  she received IV steroids and remdesivir but was unable to use baricitinib due to underling immunosuppressants. Seen with Coag-neg staph bacteremia, ID seen with no reccs for ABT's  2/10 - 2/11 Readmitted recurrent SOB/hypoxia with substernal CP. Concern that steroid taper was completed to quickly, patient was placed on 10 day taper.  2/18 - 2/25 Readmitted for recurrent SOB/hypoxia. COVID PCR remained positive. Negative for PE. Received zosyn/vanc with switch to PO Augmentin at d/c. Treated with short steroid taper   2/27 - 3/2 Readmitted and treated for SOB with fever and AMS. ? Aspiration PNA and received empiric Unasyn and switched to Augmentin on d/c  4/4 - 4/13 Readmitted from [facility] place for hypoxic respiratory failure to be due to acute on chronic congestive heart failure. COVID PCR positive.He received broad spectrum ABT which were eventually d/c due to no obvious cause. Concern for possible vasculities that temporary resolves with steroids. Encephalopathy and fevers resolve with moderate dose steroids. D/C'd on 30mg  BID prednisone and Eliquis for new acute PE seen on CTA  chest  Current admission patient continues to remain COVID positive by PCR. CTA chest reveals progressive PE no extending from the central right pulmonary artery ino the lobar segments. Patient states he has continued on high dose prednisone of 30mg  BID since d/c and has not missed any doses. Also denies any missed Eliquis doses.   Pertinent  Medical History  CAD with prior MI s/p PCI with DES, Stroke, COPD, acute on chronic hypoxic respiratory failure on 2L [place] at baseline, recent COVID PNA, HTN, HLD, RA (previosuly on Rituxan), BPH prior lumbar surgery with herniated nucleus pulposis, Barrett's esophageal ulceration, and dysphagia  Significant Hospital Events: Including procedures, antibiotic start and stop dates in addition to other pertinent events   . See above  . 4/21 admit, started on remdesivir, IV steroids . 4/22 monoclonal Ab given  Interim History / Subjective:  Coughing up green sputum, still not feeling well.  Objective   Blood pressure (!) 157/100, pulse (!) 117, temperature 97.8 F (36.6 C), temperature source Axillary, resp. rate 19, height 6\' 2"  (1.88 m), weight 88 kg, SpO2 90 %.        Intake/Output Summary (Last 24 hours) at 05/27/2020 0855 Last data filed at 05/27/2020 9326 Gross per 24 hour  Intake --  Output 2900 ml  Net -2900 ml   Filed Weights   05/25/20 1538  Weight: 88 kg    Examination: General: ill appearing man sitting up in bed in NAD, talking on the phone. HEENT: /AT, eyes anicteric Neuro: alert, answering questions appropriately with normal speech, face symmetric, moving all extremities. CV: S1S2, reg rate and rhythm  PULM:  No  tachypnea or accessory muscle use, no conversational dyspnea. Breathing comfortably on 7L [place]. [place] bilaterally, no rhonchi or wheezing. GI: soft, NT, ND Extremities: no cyanosis or edema  Skin: no rashes; excoriations on left shin  Labs/imaging that I havepersonally reviewed   BNP 212  CRP 3.9  WBC 8  H/H  12.6/38.1  Heparin <0.1, PTT 29  covid Ab undetectable  Echo LVEF 50-55%, G2DD with elevated LVEDP, normal RV size and function. Mild dilation or aortic root. Normal RAP.  Resolved Hospital Problem list     Assessment & Plan:  Acute on chronic hypoxic respiratory failure  Prolonged COVID infection in the setting of chronic immunosuppression-- still within a therapeutic window since his last rituximab infusion, plus has been on steroids increasing his risk of OI. -PCR remains positive since original swab 1/24 New Pulmonary embolism despite adequate anticoagulation with Eliquis- potentially partially attributable to high dose steroids in addition to covid-related hypercoagulability  -Seen with progressive PE on repeat CT 4/20  Bilateral pneumonia- presumed to be covid, but OI cannot be excluded Hx of COPD  P: -Con't supplemental O2 as requried to maintain SpO2 >90% -Monoclonal Ab given; expect to see improvement in the next 1-2 days. -remdesivir -Collect sputum cultures today- routine respiratory culture, fungal, AFB, DFA stain for PJP.  -Unfortunately he continues to require too much oxygen to safely bronch. If he clinically deteriorates to the point of requiring intubation, bronchscopy would be performed. -If he clinically deteriorates I would empirically treated for resistant bacterial or fungal pneumonia. -IS, pulmonary hygiene. OOB mobility would be ideal. - fungitell pending -appreciate ID's recommendations -recommend discharge on lovenox rather than DOAC given DOAC failure  HFpEF, elevated BNP -recommend diuresis with serial BNP levels  Aortic dilation on echo- concern for mild proximal aneuryms -avoid quinolones if possible  Discussed with patient and Dr. Sloan Leiter.  Best practice   Per primary   Labs   CBC: Recent Labs  Lab 05/25/20 1547 05/25/20 1626 05/26/20 0141 05/27/20 0054  WBC 11.6*  --  7.9 8.0  NEUTROABS 10.9*  --   --   --   HGB 11.5* 12.2* 10.1*  12.6*  HCT 36.6* 36.0* 32.2* 38.1*  MCV 89.3  --  90.2 95.3  PLT 155  --  126* 355    Basic Metabolic Panel: Recent Labs  Lab 05/25/20 1547 05/25/20 1626 05/25/20 2231 05/27/20 0054  NA 129* 128*  --  133*  K 4.6 4.7  --  4.8  CL 98 99  --  99  CO2 21*  --   --  28  GLUCOSE 225* 228*  --  110*  BUN 21 21  --  16  CREATININE 0.90 0.80  --  0.80  CALCIUM 9.5  --   --  8.9  MG  --   --  1.7  --      Julian Hy, DO 05/27/20 8:55 AM [facility]

## 2020-05-27 NOTE — Progress Notes (Signed)
Subjective: Patient still quite dyspneic but seems improved versus yesterday   Antibiotics:  Anti-infectives (From admission, onward)   Start     Dose/Rate Route Frequency Ordered Stop   05/27/20 1000  remdesivir 100 mg in sodium chloride 0.9 % 100 mL IVPB  Status:  Discontinued       "Followed by" Linked Group Details   100 mg 200 mL/hr over 30 Minutes Intravenous Daily 05/26/20 1134 05/26/20 1143   05/27/20 1000  remdesivir 100 mg in sodium chloride 0.9 % 100 mL IVPB       "Followed by" Linked Group Details   100 mg 200 mL/hr over 30 Minutes Intravenous Daily 05/26/20 1143 05/31/20 0959   05/26/20 1300  remdesivir 100 mg in sodium chloride 0.9 % 100 mL IVPB  Status:  Discontinued       "Followed by" Linked Group Details   100 mg 200 mL/hr over 30 Minutes Intravenous  Once 05/26/20 1134 05/26/20 1143   05/26/20 1200  remdesivir 200 mg in sodium chloride 0.9% 250 mL IVPB       "Followed by" Linked Group Details   200 mg 580 mL/hr over 30 Minutes Intravenous Once 05/26/20 1143 05/26/20 1503   05/26/20 0500  vancomycin (VANCOREADY) IVPB 1250 mg/250 mL  Status:  Discontinued        1,250 mg 166.7 mL/hr over 90 Minutes Intravenous Every 12 hours 05/25/20 1748 05/26/20 1108   05/26/20 0000  ceFEPIme (MAXIPIME) 2 g in sodium chloride 0.9 % 100 mL IVPB  Status:  Discontinued        2 g 200 mL/hr over 30 Minutes Intravenous Every 8 hours 05/25/20 1745 05/26/20 1108   05/25/20 2315  metroNIDAZOLE (FLAGYL) IVPB 500 mg  Status:  Discontinued        500 mg 100 mL/hr over 60 Minutes Intravenous Every 8 hours 05/25/20 2223 05/26/20 1108   05/25/20 1600  vancomycin (VANCOREADY) IVPB 1000 mg/200 mL  Status:  Discontinued        1,000 mg 200 mL/hr over 60 Minutes Intravenous  Once 05/25/20 1548 05/25/20 1555   05/25/20 1600  aztreonam (AZACTAM) 2 g in sodium chloride 0.9 % 100 mL IVPB  Status:  Discontinued        2 g 200 mL/hr over 30 Minutes Intravenous  Once 05/25/20 1548  05/25/20 1552   05/25/20 1600  ceFEPIme (MAXIPIME) 2 g in sodium chloride 0.9 % 100 mL IVPB  Status:  Discontinued        2 g 200 mL/hr over 30 Minutes Intravenous Every 8 hours 05/25/20 1555 05/25/20 1557   05/25/20 1600  vancomycin (VANCOREADY) IVPB 2000 mg/400 mL        2,000 mg 200 mL/hr over 120 Minutes Intravenous  Once 05/25/20 1557 05/25/20 1918   05/25/20 1600  ceFEPIme (MAXIPIME) 2 g in sodium chloride 0.9 % 100 mL IVPB        2 g 200 mL/hr over 30 Minutes Intravenous  Once 05/25/20 1557 05/25/20 1647      Medications: Scheduled Meds: . sodium chloride   Intravenous Once  . methylPREDNISolone (SOLU-MEDROL) injection  40 mg Intravenous Q12H  . mometasone-formoterol  2 puff Inhalation BID  . pantoprazole  40 mg Oral Daily  . simvastatin  20 mg Oral Daily  . tamsulosin  0.4 mg Oral Daily  . umeclidinium bromide  1 puff Inhalation Daily   Continuous Infusions: . sodium chloride    . famotidine (PEPCID) IV    .  heparin 1,050 Units/hr (05/26/20 1632)  . remdesivir 100 mg in NS 100 mL 100 mg (05/27/20 1018)   PRN Meds:.sodium chloride, acetaminophen **OR** acetaminophen, albuterol, diphenhydrAMINE, EPINEPHrine, famotidine (PEPCID) IV, melatonin, methylPREDNISolone (SOLU-MEDROL) injection    Objective: Weight change:   Intake/Output Summary (Last 24 hours) at 05/27/2020 1353 Last data filed at 05/27/2020 [id number] Gross per 24 hour  Intake --  Output 2900 ml  Net -2900 ml   Blood pressure (!) 130/94, pulse (!) 120, temperature 97.6 F (36.4 C), temperature source Oral, resp. rate 20, height 6\' 2"  (1.88 m), weight 88 kg, SpO2 93 %. Temp:  [97.4 F (36.3 C)-97.8 F (36.6 C)] 97.6 F (36.4 C) (04/22 1127) Pulse Rate:  [85-120] 120 (04/22 1127) Resp:  [17-22] 20 (04/22 1127) BP: (117-157)/(82-100) 130/94 (04/22 1127) SpO2:  [90 %-99 %] 93 % (04/22 1127)  Physical Exam: Physical Exam Constitutional:      Appearance: He is well-developed.  HENT:     Head: Normocephalic  and atraumatic.  Eyes:     Conjunctiva/sclera: Conjunctivae normal.  Cardiovascular:     Rate and Rhythm: Normal rate and regular rhythm.     Heart sounds: No murmur heard.   Pulmonary:     Effort: Pulmonary effort is normal. No respiratory distress.     Breath sounds: Rhonchi present. No wheezing.  Abdominal:     General: There is no distension.     Palpations: Abdomen is soft. There is mass.  Musculoskeletal:        General: Normal range of motion.     Cervical back: Normal range of motion and neck supple.  Skin:    General: Skin is warm and dry.     Findings: No erythema or rash.  Neurological:     Mental Status: He is alert and oriented to person, place, and time.  Psychiatric:        Attention and Perception: Attention normal.        Mood and Affect: Mood is depressed.        Speech: Speech normal.        Behavior: Behavior normal.        Thought Content: Thought content normal.        Cognition and Memory: Cognition and memory normal.        Judgment: Judgment normal.      CBC:    BMET Recent Labs    05/25/20 1547 05/25/20 1626 05/27/20 0054  NA 129* 128* 133*  K 4.6 4.7 4.8  CL 98 99 99  CO2 21*  --  28  GLUCOSE 225* 228* 110*  BUN 21 21 16   CREATININE 0.90 0.80 0.80  CALCIUM 9.5  --  8.9     Liver Panel  Recent Labs    05/25/20 1547 05/27/20 0054  PROT 5.3* 6.2*  ALBUMIN 2.3* 3.3*  AST 29 34  ALT 27 27  ALKPHOS 76 58  BILITOT 0.8 0.8       Sedimentation Rate No results for input(s): ESRSEDRATE in the last 72 hours. C-Reactive Protein Recent Labs    05/26/20 0141 05/27/20 0054  CRP 5.5* 3.9*    Micro Results: Recent Results (from the past 720 hour(s))  Urine culture     Status: None   Collection Time: 05/09/20  2:07 AM   Specimen: In/Out Cath Urine  Result Value Ref Range Status   Specimen Description IN/OUT CATH URINE  Final   Special Requests NONE  Final   Culture   Final  NO GROWTH Performed at [facility]  Lab, [address], [place], [place] [address]    Report Status 05/11/2020 FINAL  Final  Resp Panel by RT-PCR (Flu A&B, Covid) Nasopharyngeal Swab     Status: Abnormal   Collection Time: 05/09/20 12:12 PM   Specimen: Nasopharyngeal Swab; Nasopharyngeal(NP) swabs in vial transport medium  Result Value Ref Range Status   SARS Coronavirus 2 by RT PCR POSITIVE (A) NEGATIVE Final    Comment: RESULT CALLED TO, READ BACK BY AND VERIFIED WITH: RN P [facility] [id number] AT 1327 BY CM (NOTE) SARS-CoV-2 target nucleic acids are DETECTED.  The SARS-CoV-2 RNA is generally detectable in upper respiratory specimens during the acute phase of infection. Positive results are indicative of the presence of the identified virus, but do not rule out bacterial infection or co-infection with other pathogens not detected by the test. Clinical correlation with patient history and other diagnostic information is necessary to determine patient infection status. The expected result is Negative.  Fact Sheet for Patients: BloggerCourse.com  Fact Sheet for Healthcare Providers: SeriousBroker.it  This test is not yet approved or cleared by the [place] FDA and  has been authorized for detection and/or diagnosis of SARS-CoV-2 by FDA under an Emergency Use Authorization (EUA).  This EUA will remain in effect (meaning this test can be u sed) for the duration of  the COVID-19 declaration under Section 564(b)(1) of the Act, 21 U.S.C. section 360bbb-3(b)(1), unless the authorization is terminated or revoked sooner.     Influenza A by PCR NEGATIVE NEGATIVE Final   Influenza B by PCR NEGATIVE NEGATIVE Final    Comment: (NOTE) The Xpert Xpress SARS-CoV-2/FLU/RSV plus assay is intended as an aid in the diagnosis of influenza from Nasopharyngeal swab specimens and should not be used as a sole basis for treatment. Nasal washings and aspirates are unacceptable for Xpert Xpress  SARS-CoV-2/FLU/RSV testing.  Fact Sheet for Patients: BloggerCourse.com  Fact Sheet for Healthcare Providers: SeriousBroker.it  This test is not yet approved or cleared by the [place] FDA and has been authorized for detection and/or diagnosis of SARS-CoV-2 by FDA under an Emergency Use Authorization (EUA). This EUA will remain in effect (meaning this test can be used) for the duration of the COVID-19 declaration under Section 564(b)(1) of the Act, 21 U.S.C. section 360bbb-3(b)(1), unless the authorization is terminated or revoked.  Performed at [facility], [address], [place], [place] [address]   Blood Culture (routine x 2)     Status: None   Collection Time: 05/09/20 12:12 PM   Specimen: BLOOD  Result Value Ref Range Status   Specimen Description BLOOD SITE NOT SPECIFIED  Final   Special Requests   Final    BOTTLES DRAWN AEROBIC AND ANAEROBIC Blood Culture adequate volume   Culture   Final    NO GROWTH 5 DAYS Performed at [facility], [address] [place], [place] [address]    Report Status 05/14/2020 FINAL  Final  Blood Culture (routine x 2)     Status: None   Collection Time: 05/09/20 12:12 PM   Specimen: BLOOD  Result Value Ref Range Status   Specimen Description BLOOD BLOOD LEFT HAND  Final   Special Requests   Final    BOTTLES DRAWN AEROBIC AND ANAEROBIC Blood Culture adequate volume   Culture   Final    NO GROWTH 5 DAYS Performed at [facility], [address], [place], [place] [address]  Report Status 05/14/2020 FINAL  Final  MRSA PCR Screening     Status: Abnormal   Collection Time: 05/09/20 12:30 PM   Specimen: Nasal Mucosa; Nasopharyngeal  Result Value Ref Range Status   MRSA by PCR POSITIVE (A) NEGATIVE Final    Comment:        The GeneXpert MRSA Assay (FDA approved for NASAL specimens only), is one component of a comprehensive MRSA colonization surveillance program.  It is not intended to diagnose MRSA infection nor to guide or monitor treatment for MRSA infections. RESULT CALLED TO, READ BACK BY AND VERIFIED WITH: IDOL,K RN AT 2212 05/09/2020 MITCHELL,L Performed at [facility], [place] [address] [place], [place] [address]   Culture, blood (routine x 2)     Status: None   Collection Time: 05/13/20 11:07 AM   Specimen: BLOOD RIGHT HAND  Result Value Ref Range Status   Specimen Description BLOOD RIGHT HAND  Final   Special Requests   Final    BOTTLES DRAWN AEROBIC AND ANAEROBIC Blood Culture adequate volume   Culture   Final    NO GROWTH 5 DAYS Performed at [facility], [place] [address], [place], [place] [address]    Report Status 05/18/2020 FINAL  Final  Culture, blood (routine x 2)     Status: None   Collection Time: 05/13/20 11:13 AM   Specimen: BLOOD LEFT HAND  Result Value Ref Range Status   Specimen Description BLOOD LEFT HAND  Final   Special Requests   Final    BOTTLES DRAWN AEROBIC AND ANAEROBIC Blood Culture adequate volume   Culture   Final    NO GROWTH 5 DAYS Performed at [facility], [place] [address], [place],  [address]    Report Status 05/18/2020 FINAL  Final  Blastomyces Antigen     Status: None   Collection Time: 05/14/20  4:28 PM   Specimen: Blood  Result Value Ref Range Status   Blastomyces Antigen None Detected None Detected ng/mL Final    Comment: (NOTE) Results reported as ng/mL in 0.2 - 14.7 ng/mL range Results above the limit of detection but below 0.2 ng/mL are reported as 'Positive, Below the Limit of Quantification' Results above 14.7 ng/mL are reported as 'Positive, Above the Limit of Quantification'    Specimen Type SERUM  Final    Comment: (NOTE) Performed At: [facility] [place], [place] [id number] Bruce Donath MD [id number]   Resp Panel by RT-PCR (Flu A&B, Covid) Nasopharyngeal Swab     Status: Abnormal   Collection Time: 05/25/20  3:56 PM    Specimen: Nasopharyngeal Swab; Nasopharyngeal(NP) swabs in vial transport medium  Result Value Ref Range Status   SARS Coronavirus 2 by RT PCR POSITIVE (A) NEGATIVE Final    Comment: RESULT CALLED TO, READ BACK BY AND VERIFIED WITH: Sherlean Foot RN 2032 05/25/20 A BROWNING (NOTE) SARS-CoV-2 target nucleic acids are DETECTED.  The SARS-CoV-2 RNA is generally detectable in upper respiratory specimens during the acute phase of infection. Positive results are indicative of the presence of the identified virus, but do not rule out bacterial infection or co-infection with other pathogens not detected by the test. Clinical correlation with patient history and other diagnostic information is necessary to determine patient infection status. The expected result is Negative.  Fact Sheet for Patients: EntrepreneurPulse.com.au  Fact Sheet for Healthcare Providers: IncredibleEmployment.be  This test is not yet approved or cleared by the [place] and  has been authorized for detection and/or diagnosis of SARS-CoV-2 by FDA under an Emergency Use Authorization (EUA).  This EUA will remain in effect (meaning this test can  be used) for the duration of  the COVID-19 declaration under Section 564(b)(1) of the Act, 21 U.S.C. section 360bbb-3(b)(1), unless the authorization is terminated or revoked sooner.     Influenza A by PCR NEGATIVE NEGATIVE Final   Influenza B by PCR NEGATIVE NEGATIVE Final    Comment: (NOTE) The Xpert Xpress SARS-CoV-2/FLU/RSV plus assay is intended as an aid in the diagnosis of influenza from Nasopharyngeal swab specimens and should not be used as a sole basis for treatment. Nasal washings and aspirates are unacceptable for Xpert Xpress SARS-CoV-2/FLU/RSV testing.  Fact Sheet for Patients: EntrepreneurPulse.com.au  Fact Sheet for Healthcare Providers: IncredibleEmployment.be  This test is not  yet approved or cleared by the [place] FDA and has been authorized for detection and/or diagnosis of SARS-CoV-2 by FDA under an Emergency Use Authorization (EUA). This EUA will remain in effect (meaning this test can be used) for the duration of the COVID-19 declaration under Section 564(b)(1) of the Act, 21 U.S.C. section 360bbb-3(b)(1), unless the authorization is terminated or revoked.  Performed at [facility], [place] [address] [place], [place] [address]   Blood Culture (routine x 2)     Status: None (Preliminary result)   Collection Time: 05/25/20  3:59 PM   Specimen: BLOOD  Result Value Ref Range Status   Specimen Description BLOOD SITE NOT SPECIFIED  Final   Special Requests   Final    BOTTLES DRAWN AEROBIC AND ANAEROBIC Blood Culture results may not be optimal due to an excessive volume of blood received in culture bottles   Culture   Final    NO GROWTH 2 DAYS Performed at [facility], [place] [address], [place], [place] [address]    Report Status PENDING  Incomplete  Blood Culture (routine x 2)     Status: None (Preliminary result)   Collection Time: 05/25/20  6:18 PM   Specimen: BLOOD RIGHT HAND  Result Value Ref Range Status   Specimen Description BLOOD RIGHT HAND  Final   Special Requests   Final    BOTTLES DRAWN AEROBIC AND ANAEROBIC Blood Culture adequate volume   Culture   Final    NO GROWTH 2 DAYS Performed at [facility], [place] [address] [place], [place] [address]    Report Status PENDING  Incomplete  Urine culture     Status: Abnormal (Preliminary result)   Collection Time: 05/25/20  7:16 PM   Specimen: In/Out Cath Urine  Result Value Ref Range Status   Specimen Description IN/OUT CATH URINE  Final   Special Requests NONE  Final   Culture (A)  Final    20,000 COLONIES/mL STAPHYLOCOCCUS EPIDERMIDIS CULTURE REINCUBATED FOR BETTER GROWTH Performed at [facility], [place] [address], [place],  [address]    Report Status  PENDING  Incomplete    Studies/Results: CT Angio Chest PE W and/or Wo Contrast  Result Date: 05/25/2020 CLINICAL DATA:  75 year old male with shortness of breath and chest pain. Concern for pulmonary embolism. EXAM: CT ANGIOGRAPHY CHEST WITH CONTRAST TECHNIQUE: Multidetector CT imaging of the chest was performed using the standard protocol during bolus administration of intravenous contrast. Multiplanar CT image reconstructions and MIPs were obtained to evaluate the vascular anatomy. CONTRAST:  68mL OMNIPAQUE IOHEXOL 350 MG/ML SOLN COMPARISON:  Chest CT dated 05/14/2020. FINDINGS: Cardiovascular: Mild  cardiomegaly. No pericardial effusion. There is coronary vascular calcification. Mild atherosclerotic calcification of the thoracic aorta. Linear nonocclusive thrombus noted extending from the central right pulmonary artery into the lobar and segmental branches of the right lower lobe and right upper lobe. This is new since the prior CT. No CT evidence of right heart straining. Mediastinum/Nodes: Top-normal right hilar lymph nodes. The esophagus and the thyroid gland are grossly unremarkable. No mediastinal fluid collection. Lungs/Pleura: Bilateral patchy and streaky densities similar to prior CT. Calcified pleural plaque noted in the left upper lobe anteriorly. No pleural effusion pneumothorax. The central airways are patent. Upper Abdomen: No acute abnormality. Musculoskeletal: No chest wall abnormality. No acute or significant osseous findings. Review of the MIP images confirms the above findings. IMPRESSION: 1. Pulmonary artery embolus extending from the central right pulmonary artery into the lobar and segmental branches of the right lower lobe and right upper lobe, new since the prior CT. No CT evidence of right heart straining. 2. Bilateral patchy and streaky densities similar to prior CT. 3. Aortic Atherosclerosis (ICD10-I70.0). These results were called by telephone at the time of interpretation on  05/25/2020 at 6:39 pm to provider DAVID YAO , who verbally acknowledged these results. Electronically Signed   By: Anner Crete M.D.   On: 05/25/2020 18:43   DG Chest Port 1 View  Result Date: 05/25/2020 CLINICAL DATA:  75 year old male with concern for sepsis. EXAM: PORTABLE CHEST 1 VIEW COMPARISON:  Chest CT dated 05/14/2020. FINDINGS: Diffuse bilateral streaky interstitial densities worsened since 05/09/2020 but relatively similar to 05/10/2020 concerning for residual infiltrate, likely on the background of chronic changes. Clinical correlation and follow-up recommended. No lobar consolidation, pleural effusion, pneumothorax. The cardiac silhouette is within limits. No acute osseous pathology. IMPRESSION: Persistent bilateral infiltrates. Electronically Signed   By: Anner Crete M.D.   On: 05/25/2020 16:24   ECHOCARDIOGRAM COMPLETE  Result Date: 05/26/2020    ECHOCARDIOGRAM REPORT   Patient Name:   Tyrone Roman Date of Exam: 05/26/2020 Medical Rec #:  [id number]      Height:       74.0 in Accession #:    [id number]     Weight:       194.0 lb Date of Birth:  Jun 12, 1945      BSA:          2.146 m Patient Age:    3 years       BP:           129/92 mmHg Patient Gender: M              HR:           84 bpm. Exam Location:  Inpatient Procedure: 2D Echo, Cardiac Doppler, Color Doppler and Intracardiac            Opacification Agent Indications:    Pulmonary Embolus I26.09  History:        Patient has prior history of Echocardiogram examinations, most                 recent 04/03/2020. CAD and Previous Myocardial Infarction, COPD,                 Signs/Symptoms:Shortness of Breath; Risk Factors:Hypertension                 and Dyslipidemia. COVID 19. Sepsis, due to unspecified organism,                 unspecified whether acute organ dysfunction  present                 Multiple subsegmental pulmonary emboli without acute cor                 pulmonale.  Sonographer:    Darlina Sicilian RDCS Referring Phys:  [id number] [place]  1. Left ventricular ejection fraction, by estimation, is 50 to 55%. The left ventricle has low normal function. The left ventricle has no regional wall motion abnormalities. There is severe asymmetric left ventricular hypertrophy of the basal and septal  segments. Left ventricular diastolic parameters are consistent with Grade II diastolic dysfunction (pseudonormalization). Elevated left ventricular end-diastolic pressure.  2. Right ventricular systolic function is normal. The right ventricular size is normal.  3. The mitral valve is abnormal. No evidence of mitral valve regurgitation. No evidence of mitral stenosis.  4. The aortic valve is tricuspid. There is mild calcification of the aortic valve. Aortic valve regurgitation is not visualized. Mild aortic valve sclerosis is present, with no evidence of aortic valve stenosis.  5. Aortic dilatation noted. There is mild dilatation of the aortic root, measuring 41 mm.  6. The inferior vena cava is normal in size with greater than 50% respiratory variability, suggesting right atrial pressure of 3 mmHg. FINDINGS  Left Ventricle: Left ventricular ejection fraction, by estimation, is 50 to 55%. The left ventricle has low normal function. The left ventricle has no regional wall motion abnormalities. Definity contrast agent was given IV to delineate the left ventricular endocardial borders. The left ventricular internal cavity size was normal in size. There is severe asymmetric left ventricular hypertrophy of the basal and septal segments. Left ventricular diastolic parameters are consistent with Grade II diastolic dysfunction (pseudonormalization). Elevated left ventricular end-diastolic pressure. Right Ventricle: The right ventricular size is normal. No increase in right ventricular wall thickness. Right ventricular systolic function is normal. Left Atrium: Left atrial size was normal in size. Right Atrium: Right atrial size was  normal in size. Pericardium: There is no evidence of pericardial effusion. Mitral Valve: The mitral valve is abnormal. There is mild thickening of the mitral valve leaflet(s). There is mild calcification of the mitral valve leaflet(s). Mild mitral annular calcification. No evidence of mitral valve regurgitation. No evidence of mitral valve stenosis. Tricuspid Valve: The tricuspid valve is normal in structure. Tricuspid valve regurgitation is not demonstrated. No evidence of tricuspid stenosis. Aortic Valve: The aortic valve is tricuspid. There is mild calcification of the aortic valve. Aortic valve regurgitation is not visualized. Mild aortic valve sclerosis is present, with no evidence of aortic valve stenosis. Pulmonic Valve: The pulmonic valve was normal in structure. Pulmonic valve regurgitation is not visualized. No evidence of pulmonic stenosis. Aorta: The aortic root is normal in size and structure and aortic dilatation noted. There is mild dilatation of the aortic root, measuring 41 mm. Venous: The inferior vena cava is normal in size with greater than 50% respiratory variability, suggesting right atrial pressure of 3 mmHg. IAS/Shunts: No atrial level shunt detected by color flow Doppler.  LEFT VENTRICLE PLAX 2D LVIDd:         3.20 cm     Diastology LVIDs:         2.60 cm     LV e' medial:    3.15 cm/s LV PW:         1.40 cm     LV E/e' medial:  17.1 LV IVS:        2.00 cm  LV e' lateral:   3.37 cm/s LVOT diam:     2.00 cm     LV E/e' lateral: 16.0 LV SV:         41 LV SV Index:   19 LVOT Area:     3.14 cm  LV Volumes (MOD) LV vol d, MOD A2C: 91.2 ml LV vol d, MOD A4C: 78.4 ml LV vol s, MOD A2C: 43.8 ml LV vol s, MOD A4C: 36.8 ml LV SV MOD A2C:     47.4 ml LV SV MOD A4C:     78.4 ml LV SV MOD BP:      44.4 ml RIGHT VENTRICLE RV S prime:     8.16 cm/s LEFT ATRIUM             Index       RIGHT ATRIUM          Index LA diam:        3.00 cm 1.40 cm/m  RA Area:     6.38 cm LA Vol (A2C):   20.1 ml 9.37 ml/m   RA Volume:   7.69 ml  3.58 ml/m LA Vol (A4C):   21.3 ml 9.93 ml/m LA Biplane Vol: 21.6 ml 10.07 ml/m  AORTIC VALVE LVOT Vmax:   80.50 cm/s LVOT Vmean:  60.800 cm/s LVOT VTI:    0.130 m  AORTA Ao Root diam: 4.10 cm Ao Asc diam:  3.40 cm MITRAL VALVE MV Area (PHT): 3.01 cm    SHUNTS MV Decel Time: 252 msec    Systemic VTI:  0.13 m MV E velocity: 53.80 cm/s  Systemic Diam: 2.00 cm MV A velocity: 81.50 cm/s MV E/A ratio:  0.66 Jenkins Rouge MD Electronically signed by Jenkins Rouge MD Signature Date/Time: 05/26/2020/2:30:51 PM    Final    VAS [place] LOWER EXTREMITY VENOUS (DVT)  Result Date: 05/26/2020  Lower Venous DVT Study Indications: Edema, and Swelling.  Comparison Study: 05/11/20 previous Performing Technologist: Abram Sander RVS  Examination Guidelines: A complete evaluation includes B-mode imaging, spectral Doppler, color Doppler, and power Doppler as needed of all accessible portions of each vessel. Bilateral testing is considered an integral part of a complete examination. Limited examinations for reoccurring indications may be performed as noted. The reflux portion of the exam is performed with the patient in reverse Trendelenburg.  +---------+---------------+---------+-----------+----------+--------------+ RIGHT    CompressibilityPhasicitySpontaneityPropertiesThrombus Aging +---------+---------------+---------+-----------+----------+--------------+ CFV      Full           Yes      Yes                                 +---------+---------------+---------+-----------+----------+--------------+ SFJ      Full                                                        +---------+---------------+---------+-----------+----------+--------------+ FV Prox  Full                                                        +---------+---------------+---------+-----------+----------+--------------+ FV Mid   Full                                                         +---------+---------------+---------+-----------+----------+--------------+  FV DistalFull                                                        +---------+---------------+---------+-----------+----------+--------------+ PFV      Full                                                        +---------+---------------+---------+-----------+----------+--------------+ POP      Full           Yes      Yes                                 +---------+---------------+---------+-----------+----------+--------------+ PTV      Full                                                        +---------+---------------+---------+-----------+----------+--------------+ PERO     Full                                                        +---------+---------------+---------+-----------+----------+--------------+   +---------+---------------+---------+-----------+----------+-----------------+ LEFT     CompressibilityPhasicitySpontaneityPropertiesThrombus Aging    +---------+---------------+---------+-----------+----------+-----------------+ CFV      Full           Yes      Yes                                    +---------+---------------+---------+-----------+----------+-----------------+ SFJ      Full                                                           +---------+---------------+---------+-----------+----------+-----------------+ FV Prox  Full                                                           +---------+---------------+---------+-----------+----------+-----------------+ FV Mid   Full                                                           +---------+---------------+---------+-----------+----------+-----------------+ FV DistalFull                                                           +---------+---------------+---------+-----------+----------+-----------------+  PFV      Full                                                            +---------+---------------+---------+-----------+----------+-----------------+ POP      None           No       No                   Age Indeterminate +---------+---------------+---------+-----------+----------+-----------------+ PTV      None                                         Age Indeterminate +---------+---------------+---------+-----------+----------+-----------------+ PERO     None                                         Age Indeterminate +---------+---------------+---------+-----------+----------+-----------------+     Summary: RIGHT: - There is no evidence of deep vein thrombosis in the lower extremity.  - No cystic structure found in the popliteal fossa.  LEFT: - Findings consistent with age indeterminate deep vein thrombosis involving the left popliteal vein, left posterior tibial veins, and left peroneal veins. - No cystic structure found in the popliteal fossa.  *See table(s) above for measurements and observations. Electronically signed by Harold Barban MD on 05/26/2020 at 9:28:41 PM.    Final       Assessment/Plan:  INTERVAL HISTORY: Patient received bebtelovimab last night   Principal Problem:   Pulmonary embolus ([place]) Active Problems:   Rheumatoid arthritis ([place])   Acute hypoxemic respiratory failure ([place])   SIRS (systemic inflammatory response syndrome) ([place])   Elevated troponin    Tyrone Roman is a 75 y.o. male with rheumatoid arthritis, COVID-19 infection in January who has been admitted 5 times since then.  I had most recently seen him when he was on to have DVTs and pulmonary emboli in the context of fevers and encephalopathy which responded to corticosteroids.  Despite going home on 30 mg twice daily prednisone he was readmitted with fevers worsening difficulty breathing and now new pulmonary embolism in the setting of anticoagulation with Eliquis.  His COVID-19 PCR was positive again but now with a cycle threshold of 24.2.  Of note he had been  on Rituxan in the past for his RA which undoubtedly depleted his B-cell lines for some time and impaired his ability to form antibodies against COVID-19 and to clear it.  #1 COVID 19 persistence (vs reinfection) I favor the former given his recurrent admissions.  Agree with remdesivir and steroids.  Had thought he was going to get IVIG versus convalescent plasma based on the diligent literature search by critical care.  He has instead received a monoclonal antibody bebtelovimab  (It would be good for the patient if reasoning for this mAB vs IVIG or convalescent plasma was given to him)  I myself was not part of that discussion and I am not (unlike [facility] and [facility] who have extensive experience treating COVID 19 expert in management of COVID 19)  #2  Pulmonary emboli despite anticoagulation: This again to me suggest the unifying  diagnosis of persistent COVID-19 infection  #3 Respiratory failure with infiltrates: I think this is COVID-19 driven with pulmonary emboli complicating matters.  Certainly given his history of immunosuppressive therapy including corticosteroids he would be at risk for opportunistic infection such as PCP, nocardia fungal infection or nontuberculous mycobacterial infection.  I do not find his CT imaging highly consistent with any of these types of opportunistic infections.  He is going to try to do some sputum today for various cultures.  I am not eager to initiate empiric therapy against any of these organisms.  However should he deteriorate clinically we could consider an empiric regimen of Bactrim plus voriconazole to target PCP, Nocardia plus dimorphic fungi and molds.  I spent greater than 35  minutes with the patient including greater than 50% of time in face to face counsel of the patient and in coordination of his care with [facility] and critical care  Dr. Juleen China will check on the patient this weekend.  Dr. Gale Journey or Dr. West Bali to see on Monday     LOS: 2 days    Alcide Evener 05/27/2020, 1:53 PM

## 2020-05-27 NOTE — Progress Notes (Addendum)
PROGRESS NOTE                                                                                                                                                                                                             Patient Demographics:    Tyrone Roman, is a 75 y.o. male, DOB - [birth year], IOX:[id number]  Outpatient Primary MD for the patient is [name], MD   Admit date - 05/25/2020   LOS - 2  Chief Complaint  Patient presents with  . Code Sepsis       Brief Narrative: Patient is a 75 y.o. male with PMHx of RA, CAD s/p PCI, HTN, HLD, COPD, BPH-who had COVID-19 infection in January 2021-since then has had numerous hospitalizations for shortness of breath-FUO-most recently discharged from 4/4-4/13-after extensive work-up including ID evaluation-found to have PE/DVT-maintained on prednisone 30 mg p.o. twice daily (fever responsive to steroids per prior notes)-presenting back to the hospital with 4-5-day history of recurrent fever (as high as 103 F) and worsening shortness of breath.  See below for further details.  Summary of prior Hospitalizations: 1/24 - 1/26>> admit for COVID PNA- received IV steroids/remdesivir-No baricitinib/Actemra due to underling immunosuppressants. Seen with Coag-neg staph bacteremia, ID seen with no reccs for ABT's 2/10 - 2/11>> Readmitted recurrent SOB/hypoxia with substernal CP. Concern that steroid taper was completed to quickly, patient was placed on 10 day taper. 2/18 - 2/25>> Readmitted for recurrent SOB/hypoxia. COVID PCR remained positive. Negative for PE. Received zosyn/vanc with switch to PO Augmentin at d/c. Treated with short steroid taper  2/27 - 3/2>> Readmitted and treated for SOB with fever and AMS. ? Aspiration PNA and received empiric Unasyn and switched to Augmentin on d/c 4/4 - 4/13>> Readmitted from [facility] forhypoxic respiratory failure to be due to acute on chronic  congestive heart failure. COVID PCR positive.He received broad spectrum ABT which were eventually d/c due to no obvious cause.  Had FUO of unknown etiology-with concern for possible vasculities that temporary resolves with higher doses of IV steroids. D/C'd on 30mg  BID prednisone and Eliquis for new acute PE seen on CTA chest  COVID-19 vaccinated status: Vaccinated including booster ( COVID antibodies undetectable x 2 in spite of vaccine/natural infection)  Significant Events: 4/20>> Admit to [facility] for recurrent fever/shortness of breath-significant infiltrates on CT chest-worsening clot burden.  Requiring anywhere from 10-12 L of oxygen. 4/21>> MAB infusion.  Significant studie 4/21>>> Serum SARS-COV-2 antibody-IgG: Negative 4/21>>Serum Fungitell:pending 4/20>> CTA chest: PE extending from right pulmonary artery into lobar/segmental branches, significant bilateral pulmonary infiltrates. 4/21>> B/L lower ext Doppler: Left popliteal- posterior tibial-peroneal vein DVT(Previously B/L DVT on 4/6) 4/21>> Echo: EF [id number], RV systolic function normal. 4/13>> MPO/ANCA: Negative 4/12>> SPEP: No M spike 4/10>> RA factor: 12.9 (WNL) 4/10>> ACE: 34 (WNL) 4/9>> GOLD QuantiFERON: Negative 4/9>> CMV IgG/IgM: Negative 4/9>> EBV IgM: Negative 4/9>> ANA: Negative 4/9>> Serum cryptococcal/histoplasma/blastomycosis antigen: Negative 4/9>> Serum SARS-COV-2 antibody-IgG: Negative 4/6>> bilateral lower extremity Doppler: Bilateral lower extremity DVT 2/27>> Echo: EF 60-65%  COVID-19 medications: Remdesivir: 4/21>> IV Solu-Medrol: 4/21>> Bebtelovimab: 4/21 x 1  Antibiotics: Vancomycin: 4/20 x 1 Flagyl: 4/20>> 4/21 Cefepime: 4/20 x 1  Microbiology data: 4/21>> sputum AFB smear/culture, PCP smear and culture sensitivity: Ordered 4/20 >>blood culture: No growth 4/20>> SARS 2 coronavirus PCR: Positive with cycle threshold of 24.2 4/4>> SARS 2 coronavirus PCR: Positive with cycle threshold of  29.6 2/18>> SARS 2 coronavirus PCR: Positive with cycle threshold of 37.5  Procedures: None  Consults: ID, PCCM  DVT prophylaxis: IV heparin    Subjective:   Essentially unchanged-however requiring slightly less oxygen yesterday.  Still short of breath with minimal activity.  Tearful at times-asking if he is going to die   Assessment  & Plan :   Acute hypoxic respiratory failure: Multifactorial etiology-worsening clot burden/PE-likely ongoing/acute COVID-19 infection-superimposed significant bilateral pulmonary infiltrates (autoimmune/COVID inflammation versus opportunistic infection).  Remains on IV heparin-previously on oral prednisone as outpatient-has been escalated to IV Solu-Medrol-and has been started on Remdesivir.  No signs of volume overload-but will get 1 dose of IV Lasix to try and keep an negative balance.  ID/PCCM following-some mild improvement in hypoxemia compared to yesterday-Down to 7-9 L of HFNC today (was on 12 L HFNC yesterday) .  Acute vs persistent COVID-19 infection: Continues to test positive for COVID-19 since January-however last 2 tests in April-with significant cycle threshold-that continues to indicate the patient is infectious.  He unfortunately does not have serum antibodies against COVID in spite of being vaccinated and having a natural infection.  There is some suspicion-that ongoing fever/worsening hypoxemia/worsening clot burden could be from a new COVID infection that he may have picked up earlier this month.  He remains on steroids and Remdesivir-he was given 1 dose of monoclonal antibody on 4/21 (after significant discussion with ID/PCCM).  Some mild improvement in hypoxemia overnight.  Given concern for opportunistic infection-severe immunosuppression-probably will need to hold off on Actemra/baricitinib.    Bilateral pulmonary infiltrates:  Etiology of these infiltrates are clear-given significant steroid use over the past several months-history of  Rituxan use (last use either late December/early Jan)-concerned that these may represent opportunistic infections-and since his fever/encephalopathy have been so steroid responsive in the past-possible that this could be autoimmune/inflammation related to RA/COVID.  Ideally needs a bronchoscopy-however given severity of hypoxemia-it cannot be performed given risk that he may end up on a ventilator.  Currently empirically being treated for COVID-19 with steroid/Remdesivir-plan is to follow closely-and initiate empiric coverage against opportunistic infections (PCP/fungal/MAI etc.) if clinical condition deteriorates.  ID and PCCM following.  I have asked nursing staff to ensure that sputum studies for PCP/AFB/bacterial cultures get sent out today.  Pulmonary embolism/DVT: Has significant clot burden on CT done on 4/20-in spite of being on Eliquis-lower extremity Doppler on 4/21 only shows left lower extremity DVT-a prior Doppler on 4/6  showed bilateral DVT.  Suspect that in spite of being on anticoagulation-could have had further embolic event from his legs to the lung-or he could have failed Eliquis.  Remains on IV heparin for now-once he is clinically more stable-and we are sure that he will not require any procedures-plan is to transition him to subcu Lovenox-to continue on discharge as he has likely failed DOAC therapy.  Echo on 4/20 without any RV strain.  FUO: Unclear etiology-this has been ongoing since his last admission-Per prior notes-fever-overall clinical condition has been steroid responsive-prior hospitalist MD-Dr. Randol KernElgergawy had spoken e with patient's primary rheumatologist-and recommendations were to keep on prednisone 30 mg p.o. twice daily.  In spite of being on prednisone-patient continues to spike fevers.  Multiple differentials for fever at this point-possible new COVID-19 infection-worsening clot burden-and more worrisome is that it could be from a opportunistic infection given significant  pulmonary infiltrates.  ID following-culture data-work-up as above.  All antibiotics have been discontinued as of 4/21.  Chronic diastolic heart failure: Euvolemic on exam-await echo-we will keep in negative balance and dose with diuretics periodically.  Hyponatremia: Euvolemic on exam-could have SIADH pathophysiology-improved-continue to watch closely.  Dose with as needed doses of IV Lasix.  HTN: BP is stable-resume antihypertensives over the next few days.  HLD: Continue statin  History of rheumatoid arthritis: Has been on prednisone for the past several months-see above.  COPD: Continue inhaler regimen  GERD: Continue PPI  BPH: Continue Flomax  Debility/deconditioning: Due to acute on chronic illness-await PT/OT eval.  Previously had refused SNF.  ABG:    Component Value Date/Time   PHART 7.369 06/08/2009 1024   PCO2ART 40.7 06/08/2009 1024   PO2ART 73.0 (L) 06/08/2009 1024   HCO3 24.7 04/02/2020 2145   TCO2 21 (L) 05/25/2020 1626   ACIDBASEDEF 1.0 06/08/2009 1029   O2SAT 81.0 04/02/2020 2145    Vent Settings: N/A    Condition - Extremely Guarded  Family Communication  :  Tyrone Roman-Daughter-[phone]-left a voicemail on 4/21, 4/22  Code Status :  Full Code  Diet :  Diet Order            Diet Heart Room service appropriate? Yes; Fluid consistency: Thin  Diet effective now                  Disposition Plan  :   Status is: Inpatient  Remains inpatient appropriate because:Inpatient level of care appropriate due to severity of illness   Dispo: The patient is from: Home              Anticipated d/c is to: TBD              Patient currently is not medically stable to d/c.   Difficult to place patient No    Barriers to discharge: Hypoxia requiring O2 supplementation/complete 5 days of IV Remdesivir  Antimicorbials  :    Anti-infectives (From admission, onward)   Start     Dose/Rate Route Frequency Ordered Stop   05/27/20 1000  remdesivir 100 mg in  sodium chloride 0.9 % 100 mL IVPB  Status:  Discontinued       "Followed by" Linked Group Details   100 mg 200 mL/hr over 30 Minutes Intravenous Daily 05/26/20 1134 05/26/20 1143   05/27/20 1000  remdesivir 100 mg in sodium chloride 0.9 % 100 mL IVPB       "Followed by" Linked Group Details   100 mg 200 mL/hr over 30 Minutes Intravenous Daily 05/26/20 1143  05/31/20 0959   05/26/20 1300  remdesivir 100 mg in sodium chloride 0.9 % 100 mL IVPB  Status:  Discontinued       "Followed by" Linked Group Details   100 mg 200 mL/hr over 30 Minutes Intravenous  Once 05/26/20 1134 05/26/20 1143   05/26/20 1200  remdesivir 200 mg in sodium chloride 0.9% 250 mL IVPB       "Followed by" Linked Group Details   200 mg 580 mL/hr over 30 Minutes Intravenous Once 05/26/20 1143 05/26/20 1503   05/26/20 0500  vancomycin (VANCOREADY) IVPB 1250 mg/250 mL  Status:  Discontinued        1,250 mg 166.7 mL/hr over 90 Minutes Intravenous Every 12 hours 05/25/20 1748 05/26/20 1108   05/26/20 0000  ceFEPIme (MAXIPIME) 2 g in sodium chloride 0.9 % 100 mL IVPB  Status:  Discontinued        2 g 200 mL/hr over 30 Minutes Intravenous Every 8 hours 05/25/20 1745 05/26/20 1108   05/25/20 2315  metroNIDAZOLE (FLAGYL) IVPB 500 mg  Status:  Discontinued        500 mg 100 mL/hr over 60 Minutes Intravenous Every 8 hours 05/25/20 2223 05/26/20 1108   05/25/20 1600  vancomycin (VANCOREADY) IVPB 1000 mg/200 mL  Status:  Discontinued        1,000 mg 200 mL/hr over 60 Minutes Intravenous  Once 05/25/20 1548 05/25/20 1555   05/25/20 1600  aztreonam (AZACTAM) 2 g in sodium chloride 0.9 % 100 mL IVPB  Status:  Discontinued        2 g 200 mL/hr over 30 Minutes Intravenous  Once 05/25/20 1548 05/25/20 1552   05/25/20 1600  ceFEPIme (MAXIPIME) 2 g in sodium chloride 0.9 % 100 mL IVPB  Status:  Discontinued        2 g 200 mL/hr over 30 Minutes Intravenous Every 8 hours 05/25/20 1555 05/25/20 1557   05/25/20 1600  vancomycin (VANCOREADY)  IVPB 2000 mg/400 mL        2,000 mg 200 mL/hr over 120 Minutes Intravenous  Once 05/25/20 1557 05/25/20 1918   05/25/20 1600  ceFEPIme (MAXIPIME) 2 g in sodium chloride 0.9 % 100 mL IVPB        2 g 200 mL/hr over 30 Minutes Intravenous  Once 05/25/20 1557 05/25/20 1647      Inpatient Medications  Scheduled Meds: . sodium chloride   Intravenous Once  . methylPREDNISolone (SOLU-MEDROL) injection  40 mg Intravenous Q12H  . mometasone-formoterol  2 puff Inhalation BID  . pantoprazole  40 mg Oral Daily  . simvastatin  20 mg Oral Daily  . tamsulosin  0.4 mg Oral Daily  . umeclidinium bromide  1 puff Inhalation Daily   Continuous Infusions: . sodium chloride    . famotidine (PEPCID) IV    . heparin 1,050 Units/hr (05/26/20 1632)  . remdesivir 100 mg in NS 100 mL 100 mg (05/27/20 1018)   PRN Meds:.sodium chloride, acetaminophen **OR** acetaminophen, albuterol, diphenhydrAMINE, EPINEPHrine, famotidine (PEPCID) IV, melatonin, methylPREDNISolone (SOLU-MEDROL) injection   Time Spent in minutes  45  See all Orders from today for further details   Tyrone Roman M.D on 05/27/2020 at 12:09 PM  To page go to [email or web address] - use universal password  [facility] -  Office  [phone]    Objective:   Vitals:   05/27/20 0308 05/27/20 0833 05/27/20 0900 05/27/20 1127  BP: (!) 143/82 (!) 157/100 (!) 155/98 (!) 130/94  Pulse: 100 (!) 117 (!) 110 Marland Kitchen)  120  Resp: 20 19 17 20   Temp: 97.6 F (36.4 C) 97.8 F (36.6 C) 97.7 F (36.5 C) 97.6 F (36.4 C)  TempSrc: Oral Axillary  Oral  SpO2: 97% 90% 91% 93%  Weight:      Height:        Wt Readings from Last 3 Encounters:  05/25/20 88 kg  05/09/20 88 kg  05/05/20 88.5 kg     Intake/Output Summary (Last 24 hours) at 05/27/2020 1209 Last data filed at 05/27/2020 [id number] Gross per 24 hour  Intake --  Output 2900 ml  Net -2900 ml     Physical Exam Gen Exam:Alert awake-not in any distress.  Frail-chronically sick  appearing. HEENT:atraumatic, normocephalic Chest: Bibasilar rales. CVS:S1S2 regular Abdomen:soft non tender, non distended Extremities:no edema Neurology: Non focal Skin: no rash   Data Review:    CBC Recent Labs  Lab 05/25/20 1547 05/25/20 1626 05/26/20 0141 05/27/20 0054  WBC 11.6*  --  7.9 8.0  HGB 11.5* 12.2* 10.1* 12.6*  HCT 36.6* 36.0* 32.2* 38.1*  PLT 155  --  126* 203  MCV 89.3  --  90.2 95.3  MCH 28.0  --  28.3 31.5  MCHC 31.4  --  31.4 33.1  RDW 20.5*  --  20.2* 12.0  LYMPHSABS 0.2*  --   --   --   MONOABS 0.2  --   --   --   EOSABS 0.0  --   --   --   BASOSABS 0.0  --   --   --     Chemistries  Recent Labs  Lab 05/25/20 1547 05/25/20 1626 05/25/20 2231 05/27/20 0054  NA 129* 128*  --  133*  K 4.6 4.7  --  4.8  CL 98 99  --  99  CO2 21*  --   --  28  GLUCOSE 225* 228*  --  110*  BUN 21 21  --  16  CREATININE 0.90 0.80  --  0.80  CALCIUM 9.5  --   --  8.9  MG  --   --  1.7  --   AST 29  --   --  34  ALT 27  --   --  27  ALKPHOS 76  --   --  58  BILITOT 0.8  --   --  0.8   ------------------------------------------------------------------------------------------------------------------ No results for input(s): CHOL, HDL, LDLCALC, TRIG, CHOLHDL, LDLDIRECT in the last 72 hours.  Lab Results  Component Value Date   HGBA1C 6.3 (H) 05/12/2020   ------------------------------------------------------------------------------------------------------------------ No results for input(s): TSH, T4TOTAL, T3FREE, THYROIDAB in the last 72 hours.  Invalid input(s): FREET3 ------------------------------------------------------------------------------------------------------------------ Recent Labs    05/26/20 0141  FERRITIN 1,372*    Coagulation profile Recent Labs  Lab 05/25/20 1547  INR 1.7*    Recent Labs    05/26/20 0141  DDIMER 1.22*    Cardiac Enzymes No results for input(s): CKMB, TROPONINI, MYOGLOBIN in the last 168 hours.  Invalid  input(s): CK ------------------------------------------------------------------------------------------------------------------    Component Value Date/Time   BNP 212.1 (H) 05/27/2020 0054    Micro Results Recent Results (from the past 240 hour(s))  Resp Panel by RT-PCR (Flu A&B, Covid) Nasopharyngeal Swab     Status: Abnormal   Collection Time: 05/25/20  3:56 PM   Specimen: Nasopharyngeal Swab; Nasopharyngeal(NP) swabs in vial transport medium  Result Value Ref Range Status   SARS Coronavirus 2 by RT PCR POSITIVE (A) NEGATIVE Final    Comment: RESULT  CALLED TO, READ BACK BY AND VERIFIED WITH: Sherlean Foot RN 2032 05/25/20 A BROWNING (NOTE) SARS-CoV-2 target nucleic acids are DETECTED.  The SARS-CoV-2 RNA is generally detectable in upper respiratory specimens during the acute phase of infection. Positive results are indicative of the presence of the identified virus, but do not rule out bacterial infection or co-infection with other pathogens not detected by the test. Clinical correlation with patient history and other diagnostic information is necessary to determine patient infection status. The expected result is Negative.  Fact Sheet for Patients: EntrepreneurPulse.com.au  Fact Sheet for Healthcare Providers: IncredibleEmployment.be  This test is not yet approved or cleared by the [place] FDA and  has been authorized for detection and/or diagnosis of SARS-CoV-2 by FDA under an Emergency Use Authorization (EUA).  This EUA will remain in effect (meaning this test can  be used) for the duration of  the COVID-19 declaration under Section 564(b)(1) of the Act, 21 U.S.C. section 360bbb-3(b)(1), unless the authorization is terminated or revoked sooner.     Influenza A by PCR NEGATIVE NEGATIVE Final   Influenza B by PCR NEGATIVE NEGATIVE Final    Comment: (NOTE) The Xpert Xpress SARS-CoV-2/FLU/RSV plus assay is intended as an aid in the  diagnosis of influenza from Nasopharyngeal swab specimens and should not be used as a sole basis for treatment. Nasal washings and aspirates are unacceptable for Xpert Xpress SARS-CoV-2/FLU/RSV testing.  Fact Sheet for Patients: EntrepreneurPulse.com.au  Fact Sheet for Healthcare Providers: IncredibleEmployment.be  This test is not yet approved or cleared by the [place] FDA and has been authorized for detection and/or diagnosis of SARS-CoV-2 by FDA under an Emergency Use Authorization (EUA). This EUA will remain in effect (meaning this test can be used) for the duration of the COVID-19 declaration under Section 564(b)(1) of the Act, 21 U.S.C. section 360bbb-3(b)(1), unless the authorization is terminated or revoked.  Performed at [facility], [place] [address], [place], [place] [address]   Blood Culture (routine x 2)     Status: None (Preliminary result)   Collection Time: 05/25/20  3:59 PM   Specimen: BLOOD  Result Value Ref Range Status   Specimen Description BLOOD SITE NOT SPECIFIED  Final   Special Requests   Final    BOTTLES DRAWN AEROBIC AND ANAEROBIC Blood Culture results may not be optimal due to an excessive volume of blood received in culture bottles   Culture   Final    NO GROWTH 2 DAYS Performed at [facility], [place] [address] [place], [place] [address]    Report Status PENDING  Incomplete  Blood Culture (routine x 2)     Status: None (Preliminary result)   Collection Time: 05/25/20  6:18 PM   Specimen: BLOOD RIGHT HAND  Result Value Ref Range Status   Specimen Description BLOOD RIGHT HAND  Final   Special Requests   Final    BOTTLES DRAWN AEROBIC AND ANAEROBIC Blood Culture adequate volume   Culture   Final    NO GROWTH 2 DAYS Performed at [facility], [place] [address], [place], [place] [address]    Report Status PENDING  Incomplete  Urine culture     Status: Abnormal (Preliminary result)   Collection  Time: 05/25/20  7:16 PM   Specimen: In/Out Cath Urine  Result Value Ref Range Status   Specimen Description IN/OUT CATH URINE  Final   Special Requests NONE  Final   Culture (A)  Final  20,000 COLONIES/mL STAPHYLOCOCCUS EPIDERMIDIS CULTURE REINCUBATED FOR BETTER GROWTH Performed at [facility], [place] [address] [place], [place] [address]    Report Status PENDING  Incomplete    Radiology Reports CT Angio Chest PE W and/or Wo Contrast  Result Date: 05/25/2020 CLINICAL DATA:  75 year old male with shortness of breath and chest pain. Concern for pulmonary embolism. EXAM: CT ANGIOGRAPHY CHEST WITH CONTRAST TECHNIQUE: Multidetector CT imaging of the chest was performed using the standard protocol during bolus administration of intravenous contrast. Multiplanar CT image reconstructions and MIPs were obtained to evaluate the vascular anatomy. CONTRAST:  37mL OMNIPAQUE IOHEXOL 350 MG/ML SOLN COMPARISON:  Chest CT dated 05/14/2020. FINDINGS: Cardiovascular: Mild cardiomegaly. No pericardial effusion. There is coronary vascular calcification. Mild atherosclerotic calcification of the thoracic aorta. Linear nonocclusive thrombus noted extending from the central right pulmonary artery into the lobar and segmental branches of the right lower lobe and right upper lobe. This is new since the prior CT. No CT evidence of right heart straining. Mediastinum/Nodes: Top-normal right hilar lymph nodes. The esophagus and the thyroid gland are grossly unremarkable. No mediastinal fluid collection. Lungs/Pleura: Bilateral patchy and streaky densities similar to prior CT. Calcified pleural plaque noted in the left upper lobe anteriorly. No pleural effusion pneumothorax. The central airways are patent. Upper Abdomen: No acute abnormality. Musculoskeletal: No chest wall abnormality. No acute or significant osseous findings. Review of the MIP images confirms the above findings. IMPRESSION: 1. Pulmonary artery embolus  extending from the central right pulmonary artery into the lobar and segmental branches of the right lower lobe and right upper lobe, new since the prior CT. No CT evidence of right heart straining. 2. Bilateral patchy and streaky densities similar to prior CT. 3. Aortic Atherosclerosis (ICD10-I70.0). These results were called by telephone at the time of interpretation on 05/25/2020 at 6:39 pm to provider DAVID YAO , who verbally acknowledged these results. Electronically Signed   By: Anner Crete M.D.   On: 05/25/2020 18:43   CT ANGIO CHEST PE W OR WO CONTRAST  Result Date: 05/14/2020 CLINICAL DATA:  High probability sepsis due to pneumonia EXAM: CT ANGIOGRAPHY CHEST WITH CONTRAST TECHNIQUE: Multidetector CT imaging of the chest was performed using the standard protocol during bolus administration of intravenous contrast. Multiplanar CT image reconstructions and MIPs were obtained to evaluate the vascular anatomy. CONTRAST:  50mL OMNIPAQUE IOHEXOL 350 MG/ML SOLN COMPARISON:  04/02/2020 FINDINGS: Cardiovascular: Satisfactory opacification by motion of pulmonary artifact. Arteries although limited there is a convincing branching pulmonary artery filling defect affecting segmental vessels in the basal right lower lobe as marked on series 6. Additional peripheral emboli could be present and obscured by the degree of motion artifact. No central embolism is seen. No evidence of right heart strain. Heart size is normal. No pericardial effusion. Atherosclerosis of the coronaries. Mediastinum/Nodes: Negative for adenopathy or mass. Lungs/Pleura: Chronic lung disease with reticulation. Bilateral pleural plaque appearance without calcification. There is increased ground-glass density scattered in the bilateral lungs, greatest in the right upper lobe. No interlobular septal thickening or pleural fluid to implicate failure. Mild centrilobular emphysema. Upper Abdomen: Negative Musculoskeletal: No unexpected finding Review  of the MIP images confirms the above findings. Critical Value/emergent results were called by telephone at the time of interpretation on 05/14/2020 at 8:35 am to provider Elgergawy, who verbally acknowledged these results. IMPRESSION: 1. Positive for segmental pulmonary embolism in the right lower lobe. Additional pulmonary emboli could be obscured by the degree of motion artifact. 2. Background of  chronic opacities related to recent COVID infection. There is also new ground-glass opacity especially in the right upper lobe suggesting a superimposed acute pneumonia. 3. Aortic Atherosclerosis (ICD10-I70.0).  Coronary atherosclerosis. Electronically Signed   By: Monte Fantasia M.D.   On: 05/14/2020 08:35   MR BRAIN W WO CONTRAST  Result Date: 05/16/2020 CLINICAL DATA:  Delirium; fever with unknown origin, altered mental status, evaluate for encephalitis. EXAM: MRI HEAD WITHOUT AND WITH CONTRAST TECHNIQUE: Multiplanar, multiecho pulse sequences of the brain and surrounding structures were obtained without and with intravenous contrast. CONTRAST:  8mL GADAVIST GADOBUTROL 1 MMOL/ML IV SOLN COMPARISON:  Prior head CT examinations 04/02/2020 and earlier. Brain MRI 12/13/2017. FINDINGS: Brain: Mild cerebral and cerebellar atrophy. AP punctate focus of restricted diffusion is questioned within the right parietal lobe (versus artifact) (series 3, image 33). Additionally, there is apparent subtle restricted diffusion along the midline callosal splenium, slightly eccentric to the left. A few small foci of T2/FLAIR hyperintensity within the bilateral cerebral white matter and right thalamus are nonspecific, but likely reflect minimal chronic small vessel ischemic disease. Redemonstrated left retrocerebellar arachnoid cyst. There is no acute infarct. No evidence of intracranial mass. No chronic intracranial blood products. No extra-axial fluid collection. No midline shift. No abnormal intracranial enhancement. Vascular:  Expected proximal arterial flow voids. Skull and upper cervical spine: No focal marrow lesion. Incompletely assessed cervical spondylosis. Suspected C4-C5 vertebral body ankylosis. Sinuses/Orbits: Visualized orbits show no acute finding. Bilateral lens replacements. Mild paranasal sinus mucosal thickening, most notably within the bilateral ethmoid air cells. Small left maxillary sinus mucous retention cyst. Other: Right mastoid effusion. IMPRESSION: Punctate acute infarct versus artifact within the right parietal lobe. Additionally, there is apparent subtle restricted diffusion within the midline callosal splenium, slightly eccentric to the left. This may reflect a small focus of acute ischemia or artifact. Alternatively, this may reflect a cytotoxic lesion of the corpus callosum (which has a broad range of potential etiologies and clinical associations), although the appearance would be atypical for this entity. Background mild parenchymal atrophy and chronic small vessel ischemic disease. Mild paranasal sinus disease as described. Right mastoid effusion. Electronically Signed   By: Kellie Simmering DO   On: 05/16/2020 18:51   MR CERVICAL SPINE W WO CONTRAST  Result Date: 05/17/2020 CLINICAL DATA:  Initial screening for possible epidural abscess. EXAM: MRI CERVICAL SPINE WITHOUT AND WITH CONTRAST TECHNIQUE: Multiplanar and multiecho pulse sequences of the cervical spine, to include the craniocervical junction and cervicothoracic junction, were obtained without and with intravenous contrast. CONTRAST:  58mL GADAVIST GADOBUTROL 1 MMOL/ML IV SOLN COMPARISON:  Prior MRI from 03/24/2010. FINDINGS: Alignment: Reversal of the normal cervical lordosis with apex at C3-4. Trace anterolisthesis of C3 on C4 and C7 on T1, chronic and facet mediated. Vertebrae: Vertebral body height maintained without acute or chronic fracture. C4 and C5 vertebral bodies are largely ankylosed, likely degenerative. Bone marrow signal intensity  within normal limits. No discrete or worrisome osseous lesions. No abnormal marrow edema or enhancement. No findings to suggest osteomyelitis discitis or septic arthritis. Cord: Normal signal and morphology. No epidural abscess or other collection. Posterior Fossa, vertebral arteries, paraspinal tissues: Probable benign retro cerebellar arachnoid cyst noted. Visualized brain and posterior fossa otherwise unremarkable. Craniocervical junction within normal limits. Paraspinous and prevertebral soft tissues normal. Normal flow voids seen within the vertebral arteries bilaterally. Disc levels: C2-C3: Negative interspace. Right worse than left facet hypertrophy. No spinal stenosis. Mild right C3 foraminal narrowing. Left neural foramina remains patent. C3-C4: Broad-based  posterior disc osteophyte flattens and effaces the ventral thecal sac. Mild flattening of the ventral cord without cord signal changes. Mild spinal stenosis. Superimposed bilateral facet hypertrophy with left greater than right uncovertebral spurring. Severe left with moderate right C4 foraminal stenosis. C4-C5: C4 and C5 vertebral bodies are largely ankylosed. Broad posterior endplate osseous ridging flattens and effaces the ventral thecal sac. Mild spinal stenosis without cord impingement. Moderate right worse than left C5 foraminal stenosis. C5-C6: Degenerative intervertebral disc space narrowing with diffuse disc osteophyte complex. Mild flattening of the ventral thecal sac without significant spinal stenosis. Moderate left worse than right C6 foraminal narrowing. C6-C7: Degenerative intervertebral disc space narrowing with diffuse disc osteophyte complex. No significant spinal stenosis. Severe left with moderate right C7 foraminal stenosis. C7-T1: Trace anterolisthesis. No significant disc bulge. Moderate bilateral facet and ligament flavum hypertrophy. No significant spinal stenosis. Foramina remain patent. IMPRESSION: 1. No evidence for  osteomyelitis discitis or septic arthritis within the cervical spine. No epidural abscess or other infection. 2. Multilevel cervical spondylosis with resultant mild spinal stenosis at C3-4 and C4-5. 3. Multifactorial degenerative changes with resultant multilevel foraminal narrowing as above. Notable findings include severe left with moderate right C4 foraminal stenosis, moderate bilateral C5 and C6 foraminal narrowing, with severe left and moderate right C7 foraminal stenosis. Electronically Signed   By: Jeannine Boga M.D.   On: 05/17/2020 21:40   MR THORACIC SPINE W WO CONTRAST  Result Date: 05/17/2020 CLINICAL DATA:  Initial screening for possible infection, epidural abscess. EXAM: MRI THORACIC WITHOUT AND WITH CONTRAST TECHNIQUE: Multiplanar and multiecho pulse sequences of the thoracic spine were obtained without and with intravenous contrast. CONTRAST:  34mL GADAVIST GADOBUTROL 1 MMOL/ML IV SOLN COMPARISON:  None available. FINDINGS: Alignment: Straightening of the normal thoracic kyphosis. No listhesis. Vertebrae: Vertebral body height maintained without acute or chronic fracture. Bone marrow signal intensity within normal limits. Few scattered benign hemangiomata noted. No worrisome osseous lesions. No abnormal marrow edema or enhancement. No findings to suggest osteomyelitis discitis or septic arthritis. Cord: Normal signal and morphology. No epidural abscess or other collection. No abnormal enhancement. Paraspinal and other soft tissues: Paraspinous soft tissues demonstrate no acute finding. Small layering bilateral pleural effusions noted, left slightly larger than right. T2 hyperintense benign appearing cyst partially visualized extending from the right kidney. Visualized visceral structures otherwise grossly unremarkable. Disc levels: T1-2:  Unremarkable. T2-3: Tiny right paracentral disc extrusion with superior migration. Mild right-sided facet hypertrophy. No spinal stenosis. Foramina remain  patent. T3-4: Minimal disc bulge. No spinal stenosis. Foramina remain patent. T4-5: Normal interspace. Mild right-sided facet hypertrophy. No stenosis. T5-6:  Normal interspace.  Mild facet hypertrophy.  No stenosis. T6-7:  Unremarkable. T7-8: Mild degenerative intervertebral disc space narrowing with disc bulge. No spinal stenosis. Foramina remain patent. T8-9: Mild disc bulge. No spinal stenosis. Foramina remain patent. T9-10: Mild disc bulge with posterior element hypertrophy. No significant spinal stenosis. Mild bilateral foraminal narrowing. T10-11: Diffuse disc bulge, asymmetric to the right. Moderate facet and ligament flavum hypertrophy. Resultant mild to moderate spinal stenosis with mild cord flattening. No cord signal changes. Moderate bilateral foraminal narrowing. T11-12: Negative interspace. Bilateral facet hypertrophy. No significant spinal stenosis. Mild bilateral foraminal narrowing. T12-L1: Disc desiccation with small endplate Schmorl's node deformities. No stenosis. IMPRESSION: 1. No MRI evidence for osteomyelitis discitis or septic arthritis within the thoracic spine. No epidural abscess or other infection. 2. Disc bulging with facet hypertrophy at T10-11 with resultant mild to moderate spinal stenosis and moderate bilateral foraminal narrowing.  3. Additional mild spondylosis elsewhere within the thoracic spine without significant stenosis. 4. Small layering bilateral pleural effusions, left slightly larger than right. Electronically Signed   By: Jeannine Boga M.D.   On: 05/17/2020 21:52   MR Lumbar Spine W Wo Contrast  Result Date: 05/13/2020 CLINICAL DATA:  Fever, lumbar spine tenderness EXAM: MRI LUMBAR SPINE WITHOUT AND WITH CONTRAST TECHNIQUE: Multiplanar and multiecho pulse sequences of the lumbar spine were obtained without and with intravenous contrast. CONTRAST:  41mL GADAVIST GADOBUTROL 1 MMOL/ML IV SOLN COMPARISON:  04/03/2018 FINDINGS: Segmentation:  Standard. Alignment:   Stable. Vertebrae: There is now fusion across the L1-L2 disc space. There is no new marrow edema or enhancement. Multilevel degenerative plate irregularity and Schmorl's nodes. Conus medullaris and cauda equina: Conus extends to the L1-L2 level. Conus and cauda equina appear normal. No intrathecal enhancement. Paraspinal and other soft tissues: Mild lower lumbar paraspinal edema. No evidence of collection. Disc levels: L1-L2: Prior right laminectomy. Stable retrolisthesis with bridging endplate osteophytes effacing the ventral thecal sac. Similar mild to moderate foraminal stenosis. L2-L3: Disc bulge. Facet arthropathy with ligamentum flavum infolding. Slightly increased mild canal stenosis. Similar mild foraminal stenosis. L3-L4: Disc bulge. Facet arthropathy with ligamentum flavum infolding. Similar moderate canal stenosis with effacement of subarticular recesses. Similar mild to moderate left greater than right foraminal stenosis. L4-L5: Disc bulge with superimposed left subarticular/foraminal protrusion. Facet arthropathy with ligamentum flavum infolding. Similar mild canal stenosis. Similar partial effacement of the left subarticular recess. Similar mild foraminal stenosis. L5-S1: Disc bulge with endplate osteophytic ridging. Facet arthropathy. No canal stenosis. Similar mild foraminal stenosis. IMPRESSION: No evidence of discitis/osteomyelitis. Multilevel degenerative changes as detailed above without substantial change since the prior examination. Electronically Signed   By: Macy Mis M.D.   On: 05/13/2020 14:25   CT ABDOMEN PELVIS W CONTRAST  Result Date: 05/15/2020 CLINICAL DATA:  Sepsis.  Fever of unknown origin. EXAM: CT ABDOMEN AND PELVIS WITH CONTRAST TECHNIQUE: Multidetector CT imaging of the abdomen and pelvis was performed using the standard protocol following bolus administration of intravenous contrast. CONTRAST:  149mL OMNIPAQUE IOHEXOL 300 MG/ML  SOLN COMPARISON:  04/02/2020.  Chest CTA  obtained earlier today. FINDINGS: Lower chest: Progressive pulmonary embolism on the right, with emboli currently demonstrated in the distal right main pulmonary artery and significantly more clot in the intersegmental and proximal right lower lobe pulmonary arteries. Bilateral lung densities and atelectasis with improvement since this morning. Hepatobiliary: Diffuse low density of the liver relative to the spleen. Normal appearing gallbladder. Pancreas: Unremarkable. No pancreatic ductal dilatation or surrounding inflammatory changes. Spleen: Normal in size without focal abnormality. Adrenals/Urinary Tract: Normal appearing adrenal glands. Bilateral renal cysts. Multiple small bilateral renal calculi. No bladder or ureteral calculi are seen. Excreted contrast in the urinary bladder from the CTA earlier today. Stomach/Bowel: Multiple colonic diverticula without evidence of diverticulitis. Unremarkable stomach, small bowel and appendix. Vascular/Lymphatic: Atheromatous arterial calcifications without aneurysm. No enlarged lymph nodes. Two renal arteries on the left. Reproductive: Mildly enlarged prostate gland. Other: Small bilateral inguinal hernias containing fat and very small umbilical artery containing fat. Musculoskeletal: Stable lumbar and lower thoracic spine degenerative changes and fusion of the L1 and L2 vertebral bodies. IMPRESSION: 1. Progressive pulmonary embolism on the right, with emboli currently demonstrated in the distal right main pulmonary artery and significantly more clot in the intersegmental and proximal right lower lobe pulmonary arteries. These results will be called to the ordering clinician or representative by the Radiologist Assistant, and communication documented in  the PACS or [employer]. 2. Lung densities and atelectasis with improvement since this morning. 3. Diffuse hepatic steatosis. 4. Multiple small, nonobstructing bilateral renal calculi. 5. Colonic diverticulosis.  Electronically Signed   By: Claudie Revering M.D.   On: 05/15/2020 22:17   VAS [place] IVC/ILIAC (VENOUS ONLY)  Result Date: 05/11/2020 IVC/ILIAC STUDY Indications: DVT, recent COVID Limitations: Air/bowel gas.  Comparison Study: No prior study Performing Technologist: Maudry Mayhew MHA, RDMS, RVT, RDCS  Examination Guidelines: A complete evaluation includes B-mode imaging, spectral Doppler, color Doppler, and power Doppler as needed of all accessible portions of each vessel. Bilateral testing is considered an integral part of a complete examination. Limited examinations for reoccurring indications may be performed as noted.  IVC/Iliac Findings: +----------+------+--------+--------+    IVC    PatentThrombusComments +----------+------+--------+--------+ IVC Mid   patent                 +----------+------+--------+--------+ IVC Distalpatent                 +----------+------+--------+--------+  +-------------------+---------+-----------+---------+-----------+--------+         CIV        RT-PatentRT-ThrombusLT-PatentLT-ThrombusComments +-------------------+---------+-----------+---------+-----------+--------+ Common Iliac Prox                       patent                      +-------------------+---------+-----------+---------+-----------+--------+ Common Iliac Mid                        patent                      +-------------------+---------+-----------+---------+-----------+--------+ Common Iliac Distal patent              patent                      +-------------------+---------+-----------+---------+-----------+--------+  +-------------------------+---------+-----------+---------+-----------+--------+            EIV           RT-PatentRT-ThrombusLT-PatentLT-ThrombusComments +-------------------------+---------+-----------+---------+-----------+--------+ External Iliac Vein Prox                      patent                       +-------------------------+---------+-----------+---------+-----------+--------+ External Iliac Vein       patent                                          Distal                                                                    +-------------------------+---------+-----------+---------+-----------+--------+   Summary: IVC/Iliac: No evidence of thrombus in IVC and Iliac veins.  *See table(s) above for measurements and observations.  Electronically signed by Harold Barban MD on 05/11/2020 at 9:55:50 PM.    Final    DG Chest Port 1 View  Result Date: 05/25/2020 CLINICAL DATA:  75 year old male with concern for sepsis. EXAM: PORTABLE CHEST 1 VIEW COMPARISON:  Chest CT dated 05/14/2020. FINDINGS:  Diffuse bilateral streaky interstitial densities worsened since 05/09/2020 but relatively similar to 05/10/2020 concerning for residual infiltrate, likely on the background of chronic changes. Clinical correlation and follow-up recommended. No lobar consolidation, pleural effusion, pneumothorax. The cardiac silhouette is within limits. No acute osseous pathology. IMPRESSION: Persistent bilateral infiltrates. Electronically Signed   By: Elgie Collard M.D.   On: 05/25/2020 16:24   DG Chest Port 1 View  Result Date: 05/10/2020 CLINICAL DATA:  Short of breath, history of COPD EXAM: PORTABLE CHEST 1 VIEW COMPARISON:  Prior chest x-ray dated yesterday FINDINGS: Stable cardiac and mediastinal contours. Marked interval increase in diffuse interstitial prominence bilaterally now with Kerley B-lines in the periphery. Findings are most consistent with worsening CHF. No large effusion or pneumothorax. No acute osseous abnormality. IMPRESSION: Increasing interstitial pulmonary edema concerning for progressive CHF. Electronically Signed   By: Malachy Moan M.D.   On: 05/10/2020 07:54   DG Chest Port 1 View  Result Date: 05/09/2020 CLINICAL DATA:  Possible sepsis Fever and cough EXAM: PORTABLE CHEST 1 VIEW  COMPARISON:  04/04/2020 FINDINGS: Heart size within normal limits. No pulmonary vascular congestion. Interval improvement in aeration of the lungs with bilateral airspace opacities still remaining. IMPRESSION: Interval improvement in aeration of the lungs with bilateral opacities still remaining consistent with resolving pneumonia. Electronically Signed   By: Acquanetta Belling M.D.   On: 05/09/2020 12:56   ECHOCARDIOGRAM COMPLETE  Result Date: 05/26/2020    ECHOCARDIOGRAM REPORT   Patient Name:   IKEEM SCHOENIG Date of Exam: 05/26/2020 Medical Rec #:  [id number]      Height:       74.0 in Accession #:    [id number]     Weight:       194.0 lb Date of Birth:  01/04/1946      BSA:          2.146 m Patient Age:    74 years       BP:           129/92 mmHg Patient Gender: M              HR:           84 bpm. Exam Location:  Inpatient Procedure: 2D Echo, Cardiac Doppler, Color Doppler and Intracardiac            Opacification Agent Indications:    Pulmonary Embolus I26.09  History:        Patient has prior history of Echocardiogram examinations, most                 recent 04/03/2020. CAD and Previous Myocardial Infarction, COPD,                 Signs/Symptoms:Shortness of Breath; Risk Factors:Hypertension                 and Dyslipidemia. COVID 19. Sepsis, due to unspecified organism,                 unspecified whether acute organ dysfunction present                 Multiple subsegmental pulmonary emboli without acute cor                 pulmonale.  Sonographer:    Leta Jungling RDCS Referring Phys: [id number] VASUNDHRA RATHORE IMPRESSIONS  1. Left ventricular ejection fraction, by estimation, is 50 to 55%. The left ventricle has low normal function. The left ventricle has no regional wall  motion abnormalities. There is severe asymmetric left ventricular hypertrophy of the basal and septal  segments. Left ventricular diastolic parameters are consistent with Grade II diastolic dysfunction (pseudonormalization). Elevated left  ventricular end-diastolic pressure.  2. Right ventricular systolic function is normal. The right ventricular size is normal.  3. The mitral valve is abnormal. No evidence of mitral valve regurgitation. No evidence of mitral stenosis.  4. The aortic valve is tricuspid. There is mild calcification of the aortic valve. Aortic valve regurgitation is not visualized. Mild aortic valve sclerosis is present, with no evidence of aortic valve stenosis.  5. Aortic dilatation noted. There is mild dilatation of the aortic root, measuring 41 mm.  6. The inferior vena cava is normal in size with greater than 50% respiratory variability, suggesting right atrial pressure of 3 mmHg. FINDINGS  Left Ventricle: Left ventricular ejection fraction, by estimation, is 50 to 55%. The left ventricle has low normal function. The left ventricle has no regional wall motion abnormalities. Definity contrast agent was given IV to delineate the left ventricular endocardial borders. The left ventricular internal cavity size was normal in size. There is severe asymmetric left ventricular hypertrophy of the basal and septal segments. Left ventricular diastolic parameters are consistent with Grade II diastolic dysfunction (pseudonormalization). Elevated left ventricular end-diastolic pressure. Right Ventricle: The right ventricular size is normal. No increase in right ventricular wall thickness. Right ventricular systolic function is normal. Left Atrium: Left atrial size was normal in size. Right Atrium: Right atrial size was normal in size. Pericardium: There is no evidence of pericardial effusion. Mitral Valve: The mitral valve is abnormal. There is mild thickening of the mitral valve leaflet(s). There is mild calcification of the mitral valve leaflet(s). Mild mitral annular calcification. No evidence of mitral valve regurgitation. No evidence of mitral valve stenosis. Tricuspid Valve: The tricuspid valve is normal in structure. Tricuspid valve  regurgitation is not demonstrated. No evidence of tricuspid stenosis. Aortic Valve: The aortic valve is tricuspid. There is mild calcification of the aortic valve. Aortic valve regurgitation is not visualized. Mild aortic valve sclerosis is present, with no evidence of aortic valve stenosis. Pulmonic Valve: The pulmonic valve was normal in structure. Pulmonic valve regurgitation is not visualized. No evidence of pulmonic stenosis. Aorta: The aortic root is normal in size and structure and aortic dilatation noted. There is mild dilatation of the aortic root, measuring 41 mm. Venous: The inferior vena cava is normal in size with greater than 50% respiratory variability, suggesting right atrial pressure of 3 mmHg. IAS/Shunts: No atrial level shunt detected by color flow Doppler.  LEFT VENTRICLE PLAX 2D LVIDd:         3.20 cm     Diastology LVIDs:         2.60 cm     LV e' medial:    3.15 cm/s LV PW:         1.40 cm     LV E/e' medial:  17.1 LV IVS:        2.00 cm     LV e' lateral:   3.37 cm/s LVOT diam:     2.00 cm     LV E/e' lateral: 16.0 LV SV:         41 LV SV Index:   19 LVOT Area:     3.14 cm  LV Volumes (MOD) LV vol d, MOD A2C: 91.2 ml LV vol d, MOD A4C: 78.4 ml LV vol s, MOD A2C: 43.8 ml LV vol s, MOD A4C:  36.8 ml LV SV MOD A2C:     47.4 ml LV SV MOD A4C:     78.4 ml LV SV MOD BP:      44.4 ml RIGHT VENTRICLE RV S prime:     8.16 cm/s LEFT ATRIUM             Index       RIGHT ATRIUM          Index LA diam:        3.00 cm 1.40 cm/m  RA Area:     6.38 cm LA Vol (A2C):   20.1 ml 9.37 ml/m  RA Volume:   7.69 ml  3.58 ml/m LA Vol (A4C):   21.3 ml 9.93 ml/m LA Biplane Vol: 21.6 ml 10.07 ml/m  AORTIC VALVE LVOT Vmax:   80.50 cm/s LVOT Vmean:  60.800 cm/s LVOT VTI:    0.130 m  AORTA Ao Root diam: 4.10 cm Ao Asc diam:  3.40 cm MITRAL VALVE MV Area (PHT): 3.01 cm    SHUNTS MV Decel Time: 252 msec    Systemic VTI:  0.13 m MV E velocity: 53.80 cm/s  Systemic Diam: 2.00 cm MV A velocity: 81.50 cm/s MV E/A ratio:   0.66 Jenkins Rouge MD Electronically signed by Jenkins Rouge MD Signature Date/Time: 05/26/2020/2:30:51 PM    Final    VAS [place] LOWER EXTREMITY VENOUS (DVT)  Result Date: 05/26/2020  Lower Venous DVT Study Indications: Edema, and Swelling.  Comparison Study: 05/11/20 previous Performing Technologist: Abram Sander RVS  Examination Guidelines: A complete evaluation includes B-mode imaging, spectral Doppler, color Doppler, and power Doppler as needed of all accessible portions of each vessel. Bilateral testing is considered an integral part of a complete examination. Limited examinations for reoccurring indications may be performed as noted. The reflux portion of the exam is performed with the patient in reverse Trendelenburg.  +---------+---------------+---------+-----------+----------+--------------+ RIGHT    CompressibilityPhasicitySpontaneityPropertiesThrombus Aging +---------+---------------+---------+-----------+----------+--------------+ CFV      Full           Yes      Yes                                 +---------+---------------+---------+-----------+----------+--------------+ SFJ      Full                                                        +---------+---------------+---------+-----------+----------+--------------+ FV Prox  Full                                                        +---------+---------------+---------+-----------+----------+--------------+ FV Mid   Full                                                        +---------+---------------+---------+-----------+----------+--------------+ FV DistalFull                                                        +---------+---------------+---------+-----------+----------+--------------+  PFV      Full                                                        +---------+---------------+---------+-----------+----------+--------------+ POP      Full           Yes      Yes                                  +---------+---------------+---------+-----------+----------+--------------+ PTV      Full                                                        +---------+---------------+---------+-----------+----------+--------------+ PERO     Full                                                        +---------+---------------+---------+-----------+----------+--------------+   +---------+---------------+---------+-----------+----------+-----------------+ LEFT     CompressibilityPhasicitySpontaneityPropertiesThrombus Aging    +---------+---------------+---------+-----------+----------+-----------------+ CFV      Full           Yes      Yes                                    +---------+---------------+---------+-----------+----------+-----------------+ SFJ      Full                                                           +---------+---------------+---------+-----------+----------+-----------------+ FV Prox  Full                                                           +---------+---------------+---------+-----------+----------+-----------------+ FV Mid   Full                                                           +---------+---------------+---------+-----------+----------+-----------------+ FV DistalFull                                                           +---------+---------------+---------+-----------+----------+-----------------+ PFV      Full                                                           +---------+---------------+---------+-----------+----------+-----------------+  POP      None           No       No                   Age Indeterminate +---------+---------------+---------+-----------+----------+-----------------+ PTV      None                                         Age Indeterminate +---------+---------------+---------+-----------+----------+-----------------+ PERO     None                                         Age  Indeterminate +---------+---------------+---------+-----------+----------+-----------------+     Summary: RIGHT: - There is no evidence of deep vein thrombosis in the lower extremity.  - No cystic structure found in the popliteal fossa.  LEFT: - Findings consistent with age indeterminate deep vein thrombosis involving the left popliteal vein, left posterior tibial veins, and left peroneal veins. - No cystic structure found in the popliteal fossa.  *See table(s) above for measurements and observations. Electronically signed by Harold Barban MD on 05/26/2020 at 9:28:41 PM.    Final    VAS [place] LOWER EXTREMITY VENOUS (DVT)  Result Date: 05/11/2020  Lower Venous DVT Study Indications: Edema, Recent Covid infection, and SOB.  Comparison Study: 03/31/20 negative bilateral lower extremity venous duplex Performing Technologist: Maudry Mayhew MHA, RDMS, RVT, RDCS  Examination Guidelines: A complete evaluation includes B-mode imaging, spectral Doppler, color Doppler, and power Doppler as needed of all accessible portions of each vessel. Bilateral testing is considered an integral part of a complete examination. Limited examinations for reoccurring indications may be performed as noted. The reflux portion of the exam is performed with the patient in reverse Trendelenburg.  +---------+---------------+---------+-----------+----------+--------------+ RIGHT    CompressibilityPhasicitySpontaneityPropertiesThrombus Aging +---------+---------------+---------+-----------+----------+--------------+ CFV      Full           Yes      Yes                                 +---------+---------------+---------+-----------+----------+--------------+ SFJ      Full                                                        +---------+---------------+---------+-----------+----------+--------------+ FV Prox  Full                                                         +---------+---------------+---------+-----------+----------+--------------+ FV Mid   Full                                                        +---------+---------------+---------+-----------+----------+--------------+ FV DistalFull                                                        +---------+---------------+---------+-----------+----------+--------------+  PFV      Full                                                        +---------+---------------+---------+-----------+----------+--------------+ POP      Full           Yes      Yes                                 +---------+---------------+---------+-----------+----------+--------------+ PTV      Full                                                        +---------+---------------+---------+-----------+----------+--------------+ PERO     Full                                                        +---------+---------------+---------+-----------+----------+--------------+ Gastroc  None                    No                   Acute          +---------+---------------+---------+-----------+----------+--------------+   +---------+---------------+---------+-----------+----------+--------------+ LEFT     CompressibilityPhasicitySpontaneityPropertiesThrombus Aging +---------+---------------+---------+-----------+----------+--------------+ CFV      Full           Yes      Yes                                 +---------+---------------+---------+-----------+----------+--------------+ SFJ      Full                                                        +---------+---------------+---------+-----------+----------+--------------+ FV Prox  None                    No                   Acute          +---------+---------------+---------+-----------+----------+--------------+ FV Mid   None                    No                   Acute           +---------+---------------+---------+-----------+----------+--------------+ FV DistalNone                    No                   Acute          +---------+---------------+---------+-----------+----------+--------------+ PFV      Full                                                        +---------+---------------+---------+-----------+----------+--------------+  POP      None                    No                   Acute          +---------+---------------+---------+-----------+----------+--------------+ PTV      None                    No                   Acute          +---------+---------------+---------+-----------+----------+--------------+ PERO     None                    No                   Acute          +---------+---------------+---------+-----------+----------+--------------+     Summary: RIGHT: - Findings consistent with acute deep vein thrombosis involving the right gastrocnemius veins. - No cystic structure found in the popliteal fossa.  LEFT: - Findings consistent with acute deep vein thrombosis involving the left femoral vein, left popliteal vein, left posterior tibial veins, and left peroneal veins. - No cystic structure found in the popliteal fossa.  *See table(s) above for measurements and observations. Electronically signed by Harold Barban MD on 05/11/2020 at 9:56:21 PM.    Final

## 2020-05-27 NOTE — TOC Benefit Eligibility Note (Signed)
Patient Teacher, English as a foreign language completed.    The patient is currently admitted and upon discharge could be taking Enoxaparin 80 mg/0.8 ml.  The current 30 day co-pay is, $302.46 due to a $29.84 deductible remaining.   The patient is insured through [place], [place] Patient Advocate Specialist [place] Team Direct Number: [phone]  Fax: [phone]

## 2020-05-27 NOTE — Progress Notes (Addendum)
[place] for heparin Indication: pulmonary embolus  Allergies  Allergen Reactions  . Cephalexin Shortness Of Breath, Rash and Other (See Comments)    Tolerated Augmentin 2015 and 2017 (12-2017). Tolerated nafcillin 12/2017 PATIENT HAS HAD A PCN REACTION WITH IMMEDIATE RASH, FACIAL/TONGUE/THROAT SWELLING, SOB, OR LIGHTHEADEDNESS WITH HYPOTENSION:  #  #  YES  #  #  Has patient had a PCN reaction causing severe rash involving mucus membranes or skin necrosis: No Has patient had a PCN reaction that required hospitalization: No Has patient had a PCN reaction occurring within the last 10 years: No If all of the above answers are "NO", then may proceed  . Clarithromycin Shortness Of Breath and Rash  . Certolizumab Pegol Hives  . Tocilizumab Hives  . Doxycycline Rash  . Hydroxyzine Rash  . Lidoderm Other (See Comments)    "made me act weird"  . Pyrithione Zinc Rash    Patient Measurements: Height: 6\' 2"  (188 cm) Weight: 88 kg (194 lb 0.1 oz) (Per 4/13 note) IBW/kg (Calculated) : 82.2 Heparin Dosing Weight: TBW  Vital Signs: Temp: 97.6 F (36.4 C) (04/22 1127) Temp Source: Oral (04/22 1127) BP: 130/94 (04/22 1127) Pulse Rate: 120 (04/22 1127)  Labs: Recent Labs    05/25/20 1547 05/25/20 1626 05/25/20 1931 05/25/20 2231 05/26/20 0141 05/26/20 1303 05/26/20 2242 05/26/20 2313 05/27/20 0054 05/27/20 1301  HGB 11.5* 12.2*  --   --  10.1*  --   --   --  12.6*  --   HCT 36.6* 36.0*  --   --  32.2*  --   --   --  38.1*  --   PLT 155  --   --   --  126*  --   --   --  203  --   APTT 34  --   --   --  75*   < >  --  79* 29 58*  LABPROT 19.8*  --   --   --   --   --   --   --   --   --   INR 1.7*  --   --   --   --   --   --   --   --   --   HEPARINUNFRC  --   --   --   --  >1.10*  --  >1.10*  --  <0.10*  --   CREATININE 0.90 0.80  --   --   --   --   --   --  0.80  --   TROPONINIHS  --   --  67* 46*  --   --   --   --   --   --    < > =  values in this interval not displayed.    Estimated Creatinine Clearance: 94.2 mL/min (by C-G formula based on SCr of 0.8 mg/dL).   Assessment: 75 y.o. male with h/o PE/DVT, Eliquis on hold now on heparin  -aptt trend 79> 29> 58. No infusion interruptions noted -hg= 12.6   Goal of Therapy:  Heparin level 0.3-0.7 units/ml aPTT 66-102 seconds Monitor platelets by anticoagulation protocol: Yes   Plan:  -Increase heparin to 1200 units/hr -aPTT in 8 hrs and daily wth CBC daily  Hildred Laser, PharmD Clinical Pharmacist **Pharmacist phone directory can now be found on [email or web address] ([email or web address]).  Listed under [place].

## 2020-05-27 NOTE — Evaluation (Signed)
Physical Therapy Evaluation Patient Details Name: Tyrone Roman MRN: [id number] DOB: 1945-06-27 Today's Date: 05/27/2020   History of Present Illness  75 y.o. male with PMH significant for CAD with prior MI s/p PCI with DES, Stroke, COPD, acute on chronic hypoxic respiratory failure on 2L [place] at baseline, recent COVID PNA, HTN, HLD, RA (previosuly on Rituxan), BPH prior lumbar surgery with herniated nucleus pulposis, Barrett's esophageal ulceration, and dysphagia who presented to the ED with complaints of acute worsening hypoxia. CTA chest reveals progressive PE, now extending from the central right pulmonary artery into the lobar segments.    Clinical Impression  Pt admitted with above diagnosis.Initially hospitalized with covid 1/24, and has been in and out of the hospital since. Had a stay at [facility] 3/2-4/4 for therapy. He required readmission to hospital 4/4 and refused return to SNF. He went home with family and [facility] services 4/13, and has been at home since that time.  He reports engaging in minimal mobility at home, primarily staying in bed and family assisting with transfers to/from wheelchair.   On eval, pt required min assist bed mobility. Unable to progress beyond EOB due to desat and dizziness. Pt on 9L continuous O2 via HFNC. SpO2 90% at rest. Desat to 80% with activity and/or while holding conversation. He demonstrates fair sitting balance. Deficits noted in strength, balance, and activity tolerance. Pt will benefit from skilled PT to increase their independence and safety with mobility to allow discharge to the venue listed below.    Recommending hospital bed with trapeze bar and 3n1 with elogated seat. Pt reports he has a BSC at home but the opening is too small for him to use. He also reports returning his rented hospital bed when he was readmitted to the hospital.     Follow Up Recommendations Home health PT;Supervision/Assistance - 24 hour (Pt declining SNF.)    Equipment  Recommendations  Hospital bed;Other (comment);3in1 (PT) (3n1 with elongated seat, trapeze bar for hospital bed)    Recommendations for Other Services       Precautions / Restrictions Precautions Precautions: Fall;Other (comment) Precaution Comments: watch sats Restrictions Weight Bearing Restrictions: No      Mobility  Bed Mobility Overal bed mobility: Needs Assistance Bed Mobility: Supine to Sit;Sit to Supine     Supine to sit: HOB elevated;Min assist Sit to supine: Min assist;HOB elevated   General bed mobility comments: increased time, +rail    Transfers                 General transfer comment: unable to progress beyond EOB due to DOE/desat and c/o dizziness  Ambulation/Gait                Stairs            Wheelchair Mobility    Modified Rankin (Stroke Patients Only)       Balance Overall balance assessment: Needs assistance Sitting-balance support: Feet supported;Single extremity supported Sitting balance-Leahy Scale: Fair Sitting balance - Comments: supervision for safety, unilat UE support                                     Pertinent Vitals/Pain Pain Assessment: No/denies pain    Home Living Family/patient expects to be discharged to:: Private residence Living Arrangements: Children Available Help at Discharge: Family;Personal care attendant;Available 24 hours/day Type of Home: House  Home Equipment: [place] - 2 wheels;Bedside commode;Toilet riser;Cane - single point;Wheelchair - manual      Prior Function Level of Independence: Needs assistance   Gait / Transfers Assistance Needed: family assists with transfers to/from w/c, spending majority of time in bed  ADL's / Homemaking Assistance Needed: family, PCA assist with all ADLs  Comments: Pt in/out of hospital for past several months. D/C'd home with family after most recent hospitalization 4/4-4/13. Has been active with [facility] and family assisting with  all ADLs and mobility.     Hand Dominance   Dominant Hand: Right    Extremity/Trunk Assessment   Upper Extremity Assessment Upper Extremity Assessment: Generalized weakness    Lower Extremity Assessment Lower Extremity Assessment: Generalized weakness    Cervical / Trunk Assessment Cervical / Trunk Assessment: Kyphotic  Communication   Communication: No difficulties  Cognition Arousal/Alertness: Awake/alert Behavior During Therapy: WFL for tasks assessed/performed Overall Cognitive Status: Impaired/Different from baseline Area of Impairment: Attention;Memory;Safety/judgement;Problem solving                   Current Attention Level: Selective Memory: Decreased short-term memory   Safety/Judgement: Decreased awareness of safety;Decreased awareness of deficits   Problem Solving: Slow processing;Requires verbal cues;Difficulty sequencing        General Comments General comments (skin integrity, edema, etc.): Pt on 9L O2. SpO2 88-90% at rest. Desat to 80% with activity and/or holding conversation. max HR 127.    Exercises     Assessment/Plan    PT Assessment Patient needs continued PT services  PT Problem List Decreased strength;Decreased activity tolerance;Decreased balance;Decreased mobility;Decreased coordination;Decreased cognition;Decreased safety awareness;Decreased knowledge of use of DME;Cardiopulmonary status limiting activity       PT Treatment Interventions DME instruction;Gait training;Functional mobility training;Therapeutic activities;Therapeutic exercise;Balance training;Cognitive remediation;Patient/family education    PT Goals (Current goals can be found in the Care Plan section)  Acute Rehab PT Goals Patient Stated Goal: home PT Goal Formulation: With patient Time For Goal Achievement: 06/10/20 Potential to Achieve Goals: Poor    Frequency Min 3X/week   Barriers to discharge        Co-evaluation               AM-PAC PT "6  Clicks" Mobility  Outcome Measure Help needed turning from your back to your side while in a flat bed without using bedrails?: A Little Help needed moving from lying on your back to sitting on the side of a flat bed without using bedrails?: A Little Help needed moving to and from a bed to a chair (including a wheelchair)?: A Lot Help needed standing up from a chair using your arms (e.g., wheelchair or bedside chair)?: A Lot Help needed to walk in hospital room?: Total Help needed climbing 3-5 steps with a railing? : Total 6 Click Score: 12    End of Session Equipment Utilized During Treatment: Oxygen Activity Tolerance: Treatment limited secondary to medical complications (Comment);Patient limited by fatigue (desat, dizziness) Patient left: in bed;with call bell/phone within reach;with bed alarm set Nurse Communication: Mobility status;Other (comment) (need for bedpan) PT Visit Diagnosis: Unsteadiness on feet (R26.81);Other abnormalities of gait and mobility (R26.89);Muscle weakness (generalized) (M62.81);Difficulty in walking, not elsewhere classified (R26.2)    Time: 1110-1145 PT Time Calculation (min) (ACUTE ONLY): 35 min   Charges:   PT Evaluation $PT Eval Moderate Complexity: 1 Mod PT Treatments $Therapeutic Activity: 8-22 mins        Lorrin Goodell, PT  Office # [phone] Pager [phone]   Raina Mina,  Donn Pierini 05/27/2020, 12:35 PM

## 2020-05-27 NOTE — Progress Notes (Signed)
[place] for heparin Indication: pulmonary embolus  Allergies  Allergen Reactions  . Cephalexin Shortness Of Breath, Rash and Other (See Comments)    Tolerated Augmentin 2015 and 2017 (12-2017). Tolerated nafcillin 12/2017 PATIENT HAS HAD A PCN REACTION WITH IMMEDIATE RASH, FACIAL/TONGUE/THROAT SWELLING, SOB, OR LIGHTHEADEDNESS WITH HYPOTENSION:  #  #  YES  #  #  Has patient had a PCN reaction causing severe rash involving mucus membranes or skin necrosis: No Has patient had a PCN reaction that required hospitalization: No Has patient had a PCN reaction occurring within the last 10 years: No If all of the above answers are "NO", then may proceed  . Clarithromycin Shortness Of Breath and Rash  . Certolizumab Pegol Hives  . Tocilizumab Hives  . Doxycycline Rash  . Hydroxyzine Rash  . Lidoderm Other (See Comments)    "made me act weird"  . Pyrithione Zinc Rash    Patient Measurements: Height: 6\' 2"  (188 cm) Weight: 88 kg (194 lb 0.1 oz) (Per 4/13 note) IBW/kg (Calculated) : 82.2 Heparin Dosing Weight: TBW  Vital Signs: Temp: 97.5 F (36.4 C) (04/22 0016) Temp Source: Oral (04/22 0016) BP: 145/97 (04/22 0016) Pulse Rate: 93 (04/22 0016)  Labs: Recent Labs    05/25/20 1547 05/25/20 1626 05/25/20 1931 05/25/20 2231 05/26/20 0141 05/26/20 1303 05/26/20 2242 05/26/20 2313  HGB 11.5* 12.2*  --   --  10.1*  --   --   --   HCT 36.6* 36.0*  --   --  32.2*  --   --   --   PLT 155  --   --   --  126*  --   --   --   APTT 34  --   --   --  75* 128*  --  79*  LABPROT 19.8*  --   --   --   --   --   --   --   INR 1.7*  --   --   --   --   --   --   --   HEPARINUNFRC  --   --   --   --  >1.10*  --  >1.10*  --   CREATININE 0.90 0.80  --   --   --   --   --   --   TROPONINIHS  --   --  67* 46*  --   --   --   --     Estimated Creatinine Clearance: 94.2 mL/min (by C-G formula based on SCr of 0.8 mg/dL).   Assessment: 75 y.o. male with h/o  PE/DVT, Eliquis on hold, for heparin  Goal of Therapy:  Heparin level 0.3-0.7 units/ml aPTT 66-102 seconds Monitor platelets by anticoagulation protocol: Yes   Plan:  Continue Heparin at current rate  Phillis Knack, PharmD, BCPS

## 2020-05-27 NOTE — Evaluation (Signed)
Occupational Therapy Evaluation Patient Details Name: Tyrone Roman MRN: [id number] DOB: Jul 18, 1945 Today's Date: 05/27/2020    History of Present Illness 75 y.o. male with PMH significant for CAD with prior MI s/p PCI with DES, Stroke, COPD, acute on chronic hypoxic respiratory failure on 2L [place] at baseline, recent COVID PNA, HTN, HLD, RA (previosuly on Rituxan), BPH prior lumbar surgery with herniated nucleus pulposis, Barrett's esophageal ulceration, and dysphagia who presented to the ED with complaints of acute worsening hypoxia. CTA chest reveals progressive PE, now extending from the central right pulmonary artery into the lobar segments.   Clinical Impression   Patient admitted with the above diagnosis, he has been in/out of the hospital recently.  PTA he was fairly sedentary, and spending most of his days in the bed.  He relies on family for the majority of his care, as well as, having a PCA in the home to assist.  Family has to assist him with transfers to a wheelchair.  Barriers are listed below.  Currently he is limited to basic bed mobility, and has been unable to progress to anything beyond edge of bed sitting for maybe a minute.  Patient experiences dizziness with sit BP 129/92 and O2 dropping to 87%.  Patient is refusing SNF, and wants to return home.  If family can continue to provide 24 hour heavy care, than this will be the plan.  OT will follow in the acute setting to maximize his functional status.      Follow Up Recommendations  SNF;Supervision/Assistance - 24 hour;Home health OT    Equipment Recommendations  None recommended by OT    Recommendations for Other Services       Precautions / Restrictions Precautions Precautions: Fall;Other (comment) Precaution Comments: watch sats Restrictions Weight Bearing Restrictions: No      Mobility Bed Mobility Overal bed mobility: Needs Assistance Bed Mobility: Rolling;Sidelying to Sit;Sit to Sidelying Rolling:  Supervision Sidelying to sit: Min assist Supine to sit: Supervision Sit to supine: Min assist;HOB elevated Sit to sidelying: Mod assist General bed mobility comments: increased time, +rail    Transfers                 General transfer comment: unable to progress beyond EOB due to DOE/desat and c/o dizziness    Balance Overall balance assessment: Needs assistance Sitting-balance support: Feet supported;Single extremity supported Sitting balance-Leahy Scale: Fair Sitting balance - Comments: supervision for safety, unilat UE support                                   ADL either performed or assessed with clinical judgement   ADL Overall ADL's : Needs assistance/impaired Eating/Feeding: Set up;Bed level   Grooming: Wash/dry hands;Wash/dry face;Set up;Bed level   Upper Body Bathing: Moderate assistance;Bed level   Lower Body Bathing: Maximal assistance;Bed level   Upper Body Dressing : Moderate assistance;Bed level   Lower Body Dressing: Bed level;Total assistance                 General ADL Comments: patient unable to stand or pivot to recliner due to dizziness.  BP checked in sit: 129/92 with symptomatic dizziness.     Vision Patient Visual Report: No change from baseline       Perception     Praxis      Pertinent Vitals/Pain Pain Assessment: No/denies pain     Hand Dominance Right   Extremity/Trunk Assessment  Upper Extremity Assessment Upper Extremity Assessment: Generalized weakness   Lower Extremity Assessment Lower Extremity Assessment: Defer to PT evaluation   Cervical / Trunk Assessment Cervical / Trunk Assessment: Kyphotic   Communication Communication Communication: No difficulties   Cognition Arousal/Alertness: Awake/alert Behavior During Therapy: WFL for tasks assessed/performed Overall Cognitive Status: Impaired/Different from baseline Area of Impairment: Attention;Memory;Safety/judgement;Problem solving                    Current Attention Level: Selective Memory: Decreased short-term memory   Safety/Judgement: Decreased awareness of safety;Decreased awareness of deficits   Problem Solving: Requires verbal cues;Requires tactile cues     General Comments  Pt on 9L O2. SpO2 88-90% at rest. Desat to 80% with activity and/or holding conversation. max HR 127.    Exercises     Shoulder Instructions      Home Living Family/patient expects to be discharged to:: Private residence Living Arrangements: Children Available Help at Discharge: Family;Personal care attendant;Available 24 hours/day Type of Home: House Home Access: Stairs to enter [employer] of Steps: 2 Entrance Stairs-Rails: Right;Left Home Layout: Two level;Able to live on main level with bedroom/bathroom;1/2 bath on main level Alternate Level Stairs-Number of Steps: flight Alternate Level Stairs-Rails: Right;Left;Can reach both Bathroom Shower/Tub: Occupational psychologist: Handicapped height Bathroom Accessibility: Yes   Home Equipment: Environmental consultant - 2 wheels;Bedside commode;Toilet riser;Cane - single point;Wheelchair - manual          Prior Functioning/Environment Level of Independence: Needs assistance  Gait / Transfers Assistance Needed: family assists with transfers to/from w/c, spending majority of time in bed ADL's / Homemaking Assistance Needed: family, PCA assist with all ADLs   Comments: Per chart: Pt in/out of hospital for past several months. D/C'd home with family after most recent hospitalization 4/4-4/13. Has been active with [facility] and family assisting with all ADLs and mobility.        OT Problem List: Decreased strength;Decreased activity tolerance;Impaired balance (sitting and/or standing);Decreased cognition;Decreased safety awareness;Decreased knowledge of use of DME or AE;Cardiopulmonary status limiting activity      OT Treatment/Interventions: Self-care/ADL training;Therapeutic  exercise;Energy conservation;DME and/or AE instruction;Therapeutic activities;Patient/family education;Balance training    OT Goals(Current goals can be found in the care plan section) Acute Rehab OT Goals Patient Stated Goal: Go to my daughter's graduation from [place] in June OT Goal Formulation: With patient Time For Goal Achievement: 05/24/20 Potential to Achieve Goals: Good ADL Goals Pt Will Perform Grooming: with set-up;sitting Pt Will Perform Upper Body Bathing: with set-up;sitting Pt Will Perform Upper Body Dressing: with set-up;sitting Pt Will Transfer to Toilet: with supervision;stand pivot transfer;bedside commode  OT Frequency: Min 2X/week   Barriers to D/C:    none noted       Co-evaluation              AM-PAC OT "6 Clicks" Daily Activity     Outcome Measure Help from another person eating meals?: None Help from another person taking care of personal grooming?: None Help from another person toileting, which includes using toliet, bedpan, or urinal?: A Lot Help from another person bathing (including washing, rinsing, drying)?: A Lot Help from another person to put on and taking off regular upper body clothing?: A Lot Help from another person to put on and taking off regular lower body clothing?: A Lot 6 Click Score: 16   End of Session Equipment Utilized During Treatment: Oxygen  Activity Tolerance: Patient limited by fatigue Patient left: with call bell/phone within reach;in bed;with  nursing/sitter in room  OT Visit Diagnosis: Unsteadiness on feet (R26.81);Other abnormalities of gait and mobility (R26.89);Muscle weakness (generalized) (M62.81)                Time: [id number] OT Time Calculation (min): 21 min Charges:  OT General Charges $OT Visit: 1 Visit OT Evaluation $OT Eval Moderate Complexity: 1 Mod  05/27/2020  Rich, OTR/L  Acute Rehabilitation Services  Office:  [phone]   Metta Clines 05/27/2020, 4:19 PM

## 2020-05-28 ENCOUNTER — Other Ambulatory Visit: Payer: Self-pay

## 2020-05-28 ENCOUNTER — Inpatient Hospital Stay (HOSPITAL_COMMUNITY): Payer: Medicare Other

## 2020-05-28 DIAGNOSIS — J9690 Respiratory failure, unspecified, unspecified whether with hypoxia or hypercapnia: Secondary | ICD-10-CM | POA: Diagnosis not present

## 2020-05-28 DIAGNOSIS — I2699 Other pulmonary embolism without acute cor pulmonale: Secondary | ICD-10-CM | POA: Diagnosis not present

## 2020-05-28 DIAGNOSIS — U071 COVID-19: Secondary | ICD-10-CM | POA: Diagnosis not present

## 2020-05-28 LAB — COMPREHENSIVE METABOLIC PANEL
ALT: 24 U/L (ref 0–44)
AST: 29 U/L (ref 15–41)
Albumin: 2.2 g/dL — ABNORMAL LOW (ref 3.5–5.0)
Alkaline Phosphatase: 82 U/L (ref 38–126)
Anion gap: 12 (ref 5–15)
BUN: 24 mg/dL — ABNORMAL HIGH (ref 8–23)
CO2: 21 mmol/L — ABNORMAL LOW (ref 22–32)
Calcium: 9 mg/dL (ref 8.9–10.3)
Chloride: 97 mmol/L — ABNORMAL LOW (ref 98–111)
Creatinine, Ser: 1.09 mg/dL (ref 0.61–1.24)
GFR, Estimated: >60 mL/min (ref >60)
Glucose, Bld: 554 mg/dL (ref 70–99)
Potassium: 4.7 mmol/L (ref 3.5–5.1)
Sodium: 130 mmol/L — ABNORMAL LOW (ref 135–145)
Total Bilirubin: 0.3 mg/dL (ref 0.3–1.2)
Total Protein: 4.8 g/dL — ABNORMAL LOW (ref 6.5–8.1)

## 2020-05-28 LAB — CBC
HCT: 33.2 % — ABNORMAL LOW (ref 39.0–52.0)
Hemoglobin: 10.7 g/dL — ABNORMAL LOW (ref 13.0–17.0)
MCH: 28.7 pg (ref 26.0–34.0)
MCHC: 32.2 g/dL (ref 30.0–36.0)
MCV: 89 fL (ref 80.0–100.0)
Platelets: UNDETERMINED 10*3/uL (ref 150–400)
RBC: 3.73 MIL/uL — ABNORMAL LOW (ref 4.22–5.81)
RDW: 20 % — ABNORMAL HIGH (ref 11.5–15.5)
WBC: 10.4 10*3/uL (ref 4.0–10.5)
nRBC: 0 % (ref 0.0–0.2)

## 2020-05-28 LAB — APTT
aPTT: 142 seconds — ABNORMAL HIGH (ref 24–36)
aPTT: 74 seconds — ABNORMAL HIGH (ref 24–36)
aPTT: 61 seconds — ABNORMAL HIGH (ref 24–36)

## 2020-05-28 LAB — C-REACTIVE PROTEIN: CRP: 4.4 mg/dL — ABNORMAL HIGH (ref <1.0)

## 2020-05-28 LAB — ACID FAST SMEAR (AFB, MYCOBACTERIA): Acid Fast Smear: NEGATIVE

## 2020-05-28 LAB — GLUCOSE, CAPILLARY
Glucose-Capillary: 516 mg/dL (ref 70–99)
Glucose-Capillary: 389 mg/dL — ABNORMAL HIGH (ref 70–99)
Glucose-Capillary: 364 mg/dL — ABNORMAL HIGH (ref 70–99)
Glucose-Capillary: 301 mg/dL — ABNORMAL HIGH (ref 70–99)
Glucose-Capillary: 322 mg/dL — ABNORMAL HIGH (ref 70–99)

## 2020-05-28 LAB — HEPARIN LEVEL (UNFRACTIONATED)
Heparin Unfractionated: 0.65 IU/mL (ref 0.30–0.70)
Heparin Unfractionated: 0.8 IU/mL — ABNORMAL HIGH (ref 0.30–0.70)
Heparin Unfractionated: 1.08 IU/mL — ABNORMAL HIGH (ref 0.30–0.70)
Heparin Unfractionated: >1.1 IU/mL — ABNORMAL HIGH (ref 0.30–0.70)

## 2020-05-28 LAB — BRAIN NATRIURETIC PEPTIDE: B Natriuretic Peptide: 219.5 pg/mL — ABNORMAL HIGH (ref 0.0–100.0)

## 2020-05-28 LAB — MAGNESIUM: Magnesium: 2 mg/dL (ref 1.7–2.4)

## 2020-05-28 MED ORDER — METOPROLOL TARTRATE 25 MG PO TABS
25.0000 mg | ORAL_TABLET | Freq: Two times a day (BID) | ORAL | Status: DC
  Administered 2020-05-28: 25 mg via ORAL
  Filled 2020-05-28: qty 1

## 2020-05-28 MED ORDER — INSULIN ASPART 100 UNIT/ML ~~LOC~~ SOLN: 20.0000 [IU] | Freq: Every day | SUBCUTANEOUS | Status: DC

## 2020-05-28 MED ORDER — INSULIN ASPART 100 UNIT/ML ~~LOC~~ SOLN
0.0000 [IU] | Freq: Three times a day (TID) | SUBCUTANEOUS | Status: DC
  Administered 2020-05-28: 15 [IU] via SUBCUTANEOUS

## 2020-05-28 MED ORDER — METOPROLOL TARTRATE 50 MG PO TABS
50.0000 mg | ORAL_TABLET | Freq: Two times a day (BID) | ORAL | Status: DC
  Administered 2020-05-28 – 2020-06-02 (×10): 50 mg via ORAL
  Filled 2020-05-28 (×10): qty 1

## 2020-05-28 MED ORDER — INSULIN ASPART 100 UNIT/ML ~~LOC~~ SOLN
12.0000 [IU] | Freq: Once | SUBCUTANEOUS | Status: AC
  Administered 2020-05-28: 12 [IU] via SUBCUTANEOUS

## 2020-05-28 MED ORDER — INSULIN ASPART 100 UNIT/ML ~~LOC~~ SOLN
0.0000 [IU] | Freq: Three times a day (TID) | SUBCUTANEOUS | Status: DC
  Administered 2020-05-28: 20 [IU] via SUBCUTANEOUS
  Administered 2020-05-28: 15 [IU] via SUBCUTANEOUS
  Administered 2020-05-29 (×2): 20 [IU] via SUBCUTANEOUS
  Administered 2020-05-29: 11 [IU] via SUBCUTANEOUS
  Administered 2020-05-30: 9 [IU] via SUBCUTANEOUS
  Administered 2020-05-30: 7 [IU] via SUBCUTANEOUS
  Administered 2020-05-30: 11 [IU] via SUBCUTANEOUS
  Administered 2020-05-31: 3 [IU] via SUBCUTANEOUS
  Administered 2020-05-31 (×2): 11 [IU] via SUBCUTANEOUS
  Administered 2020-06-01: 7 [IU] via SUBCUTANEOUS
  Administered 2020-06-01: 3 [IU] via SUBCUTANEOUS
  Administered 2020-06-01 – 2020-06-02 (×3): 4 [IU] via SUBCUTANEOUS

## 2020-05-28 MED ORDER — INSULIN ASPART 100 UNIT/ML ~~LOC~~ SOLN
6.0000 [IU] | Freq: Three times a day (TID) | SUBCUTANEOUS | Status: DC
  Administered 2020-05-28 – 2020-05-31 (×9): 6 [IU] via SUBCUTANEOUS

## 2020-05-28 MED ORDER — INSULIN ASPART 100 UNIT/ML ~~LOC~~ SOLN: 3.0000 [IU] | Freq: Three times a day (TID) | SUBCUTANEOUS | Status: DC

## 2020-05-28 MED ORDER — METOPROLOL TARTRATE 25 MG PO TABS
25.0000 mg | ORAL_TABLET | Freq: Once | ORAL | Status: AC
  Administered 2020-05-28: 25 mg via ORAL
  Filled 2020-05-28: qty 1

## 2020-05-28 MED ORDER — INSULIN GLARGINE 100 UNIT/ML ~~LOC~~ SOLN
20.0000 [IU] | Freq: Every day | SUBCUTANEOUS | Status: DC
  Administered 2020-05-28 – 2020-05-29 (×2): 20 [IU] via SUBCUTANEOUS
  Filled 2020-05-28 (×2): qty 0.2

## 2020-05-28 MED ORDER — INSULIN ASPART 100 UNIT/ML ~~LOC~~ SOLN
0.0000 [IU] | Freq: Every day | SUBCUTANEOUS | Status: DC
  Administered 2020-05-28: 4 [IU] via SUBCUTANEOUS
  Administered 2020-05-29: 5 [IU] via SUBCUTANEOUS
  Administered 2020-05-30: 3 [IU] via SUBCUTANEOUS
  Administered 2020-06-01: 2 [IU] via SUBCUTANEOUS

## 2020-05-28 MED ORDER — FUROSEMIDE 10 MG/ML IJ SOLN
40.0000 mg | Freq: Once | INTRAMUSCULAR | Status: AC
  Administered 2020-05-28: 40 mg via INTRAVENOUS
  Filled 2020-05-28: qty 4

## 2020-05-28 NOTE — Progress Notes (Signed)
[place] for heparin Indication: pulmonary embolus  Allergies  Allergen Reactions  . Cephalexin Shortness Of Breath, Rash and Other (See Comments)    Tolerated Augmentin 2015 and 2017 (12-2017). Tolerated nafcillin 12/2017 PATIENT HAS HAD A PCN REACTION WITH IMMEDIATE RASH, FACIAL/TONGUE/THROAT SWELLING, SOB, OR LIGHTHEADEDNESS WITH HYPOTENSION:  #  #  YES  #  #  Has patient had a PCN reaction causing severe rash involving mucus membranes or skin necrosis: No Has patient had a PCN reaction that required hospitalization: No Has patient had a PCN reaction occurring within the last 10 years: No If all of the above answers are "NO", then may proceed  . Clarithromycin Shortness Of Breath and Rash  . Certolizumab Pegol Hives  . Tocilizumab Hives  . Doxycycline Rash  . Hydroxyzine Rash  . Lidoderm Other (See Comments)    "made me act weird"  . Pyrithione Zinc Rash    Patient Measurements: Height: 6\' 2"  (188 cm) Weight: 88 kg (194 lb 0.1 oz) (Per 4/13 note) IBW/kg (Calculated) : 82.2 Heparin Dosing Weight: TBW  Vital Signs: Temp: 97.6 F (36.4 C) (04/23 1946) Temp Source: Oral (04/23 1946) BP: 119/77 (04/23 1946) Pulse Rate: 100 (04/23 1946)  Labs: Recent Labs     0000 05/25/20 2231 05/26/20 0141 05/26/20 1303 05/27/20 0054 05/27/20 1301 05/28/20 0000 05/28/20 0047 05/28/20 1335 05/28/20 2037  HGB   < >  --  10.1*  --  12.6*  --   --  10.7*  --   --   HCT  --   --  32.2*  --  38.1*  --   --  33.2*  --   --   PLT  --   --  126*  --  203  --   --  PLATELET CLUMPS NOTED ON SMEAR, UNABLE TO ESTIMATE  --   --   APTT  --   --  75*   < > 29   < >  --  142* 74* 61*  HEPARINUNFRC  --   --  >1.10*   < > <0.10*  --  1.08* >1.10* 0.80*  --   CREATININE  --   --   --   --  0.80  --   --  1.09  --   --   TROPONINIHS  --  46*  --   --   --   --   --   --   --   --    < > = values in this interval not displayed.    Estimated Creatinine  Clearance: 69.1 mL/min (by C-G formula based on SCr of 1.09 mg/dL).   Medical History: Past Medical History:  Diagnosis Date  . Allergy   . Barrett's esophageal ulceration   . BPH (benign prostatic hyperplasia)   . CAD (coronary artery disease)    sees Dr. Mar Daring  . Cataract    both eyes removed   . Colonic polyp   . Concussion with loss of consciousness of 30 minutes or less   . Contact dermatitis   . Contact with or exposure to venereal diseases   . COPD (chronic obstructive pulmonary disease) ([place])    sees Dr. Kara Mead   . Diverticulosis of colon   . Dysphagia   . GERD (gastroesophageal reflux disease)    past hx- on meds   . HNP (herniated nucleus pulposus), lumbar    recurrent  . Hyperlipidemia   . Hypertension   .  Myocardial infarction [facility])    2011- stent placed  . Nephrolithiasis   . Rheumatoid arthritis [facility])    sees Dr. Gavin Pound   . SOB (shortness of breath)    Assessment: 80 YOM presenting with worsening SOB and fever, previously admitted with new PE and discharged on Eliquis w/7d load, now on 5mg  BID. Last dose apix on 4/20 AM.   APTT came back slightly subtherapeutic at 61, heparin level is 0.65, on heparin infusion at 1050 units/hr. No s/sx of bleeding or infusion issues per RN. Lab drawn correctly.   Goal of Therapy:  Heparin level 0.3-0.7 units/ml aPTT 66-102 seconds Monitor platelets by anticoagulation protocol: Yes   Plan:  Increase heparin to 1150 units/hr Order heparin level in 8 hours Daily CBC Monitor for signs and symptoms of bleeding  Antonietta Jewel, PharmD, [place] Pharmacist  Phone: [phone] 05/28/2020 10:05 PM  Please check AMION for all [place] phone numbers After 10:00 PM, call [place] [phone]

## 2020-05-28 NOTE — Progress Notes (Signed)
Lab called to notify of critical glucose 554. CBG 516. Dr. Cyd Silence notified. One time dose of novoLOG 12 units ordered. CBG checks AC/HS and sliding scale insulin ordered. Will continue to monitor. Adella Hare, RN

## 2020-05-28 NOTE — Progress Notes (Deleted)
Patient d/c from unit. Discharge instructions given and education completed.   Raelyn Number, RN

## 2020-05-28 NOTE — Progress Notes (Signed)
[place] for heparin Indication: pulmonary embolus  Allergies  Allergen Reactions  . Cephalexin Shortness Of Breath, Rash and Other (See Comments)    Tolerated Augmentin 2015 and 2017 (12-2017). Tolerated nafcillin 12/2017 PATIENT HAS HAD A PCN REACTION WITH IMMEDIATE RASH, FACIAL/TONGUE/THROAT SWELLING, SOB, OR LIGHTHEADEDNESS WITH HYPOTENSION:  #  #  YES  #  #  Has patient had a PCN reaction causing severe rash involving mucus membranes or skin necrosis: No Has patient had a PCN reaction that required hospitalization: No Has patient had a PCN reaction occurring within the last 10 years: No If all of the above answers are "NO", then may proceed  . Clarithromycin Shortness Of Breath and Rash  . Certolizumab Pegol Hives  . Tocilizumab Hives  . Doxycycline Rash  . Hydroxyzine Rash  . Lidoderm Other (See Comments)    "made me act weird"  . Pyrithione Zinc Rash    Patient Measurements: Height: 6\' 2"  (188 cm) Weight: 88 kg (194 lb 0.1 oz) (Per 4/13 note) IBW/kg (Calculated) : 82.2 Heparin Dosing Weight: TBW  Vital Signs: Temp: 97.7 F (36.5 C) (04/23 0319) Temp Source: Oral (04/23 0319) BP: 145/99 (04/23 0319) Pulse Rate: 100 (04/23 0319)  Labs: Recent Labs    05/25/20 1547 05/25/20 1626 05/25/20 1931 05/25/20 2231 05/26/20 0141 05/26/20 1303 05/27/20 0054 05/27/20 1301 05/28/20 0000 05/28/20 0047  HGB 11.5* 12.2*  --   --  10.1*  --  12.6*  --   --  10.7*  HCT 36.6* 36.0*  --   --  32.2*  --  38.1*  --   --  33.2*  PLT 155  --   --   --  126*  --  203  --   --  PLATELET CLUMPS NOTED ON SMEAR, UNABLE TO ESTIMATE  APTT 34  --   --   --  75*   < > 29 58*  --  142*  LABPROT 19.8*  --   --   --   --   --   --   --   --   --   INR 1.7*  --   --   --   --   --   --   --   --   --   HEPARINUNFRC  --   --   --   --  >1.10*   < > <0.10*  --  1.08* >1.10*  CREATININE 0.90 0.80  --   --   --   --  0.80  --   --  1.09  TROPONINIHS  --    --  67* 46*  --   --   --   --   --   --    < > = values in this interval not displayed.    Estimated Creatinine Clearance: 69.1 mL/min (by C-G formula based on SCr of 1.09 mg/dL).   Medical History: Past Medical History:  Diagnosis Date  . Allergy   . Barrett's esophageal ulceration   . BPH (benign prostatic hyperplasia)   . CAD (coronary artery disease)    sees Dr. Mar Daring  . Cataract    both eyes removed   . Colonic polyp   . Concussion with loss of consciousness of 30 minutes or less   . Contact dermatitis   . Contact with or exposure to venereal diseases   . COPD (chronic obstructive pulmonary disease) ([place])    sees Dr. Davonna Belling  [place]   . Diverticulosis of colon   . Dysphagia   . GERD (gastroesophageal reflux disease)    past hx- on meds   . HNP (herniated nucleus pulposus), lumbar    recurrent  . Hyperlipidemia   . Hypertension   . Myocardial infarction [facility])    2011- stent placed  . Nephrolithiasis   . Rheumatoid arthritis [facility])    sees Dr. Gavin Pound   . SOB (shortness of breath)    Assessment: 72 YOM presenting with worsening SOB and fever, previously admitted with new PE and discharged on Eliquis w/7d load, now on 5mg  BID.  Discussed with MD, will not bolus but will start gtt with concern for worsening PE, was supratherapeutic on 1400 units/hr and decreased to 1250 units/hr so will start there.    4/23 AM update: APTT supra-therapeutic   Goal of Therapy:  Heparin level 0.3-0.7 units/ml aPTT 66-102 seconds Monitor platelets by anticoagulation protocol: Yes   Plan:  Dec heparin to 1050 units/hr 1300 aPTT/heparin level  Narda Bonds, PharmD, BCPS Clinical Pharmacist Phone: [phone]

## 2020-05-28 NOTE — Progress Notes (Signed)
[place] for Infectious Disease  Date of Admission:  05/25/2020           Reason for visit: Follow up on COVID  Current antibiotics: Remdesivir 4/21-present   ASSESSMENT & PLAN:    #COVID-19 persistent infection versus reinfection: Currently on remdesivir and steroids.  Status post monoclonal antibody with bebtelovimab on 05/26/2020.  Continues to not have serum antibodies against COVID despite being vaccinated and having had natural infection.  Suspicious that ongoing fevers, hypoxia, and clot burden could be from persistent versus new infection.  Some mild improvement noted in hypoxemia overnight. #Respiratory failure with pulmonary infiltrates: At risk for opportunistic infection given history of immunosuppressive therapy, however, CT imaging not significantly consistent with these types of OI.  Work-up for this currently pending and empiric therapy being held at the moment.  -Continue remdesivir and steroids per primary team -Await sputum cultures and pneumocystis smear -Await Fungitell -If he were to clinically deteriorate could consider empiric Bactrim plus voriconazole -Will follow -Discussed with hospitalist regarding patient's desire to transfer to tertiary center.  They will reach out to see if he is appropriate for this.  #Pulmonary emboli/DVT: Despite anticoagulation in the setting of likely persistent COVID-19 infection  -Continue anticoagulation per primary team   Principal Problem:   COVID-19 Active Problems:   Rheumatoid arthritis ([place])   Acute hypoxemic respiratory failure (HCC)   Pulmonary embolus (HCC)   SIRS (systemic inflammatory response syndrome) (HCC)   Elevated troponin    MEDICATIONS:    Scheduled Meds: . sodium chloride   Intravenous Once  . insulin aspart  0-20 Units Subcutaneous TID WC  . insulin aspart  0-5 Units Subcutaneous QHS  . insulin aspart  6 Units Subcutaneous TID WC  . insulin glargine  20 Units Subcutaneous Daily  .  methylPREDNISolone (SOLU-MEDROL) injection  40 mg Intravenous Q12H  . metoprolol tartrate  25 mg Oral Once  . metoprolol tartrate  50 mg Oral BID  . mometasone-formoterol  2 puff Inhalation BID  . pantoprazole  40 mg Oral Daily  . simvastatin  20 mg Oral Daily  . tamsulosin  0.4 mg Oral Daily  . umeclidinium bromide  1 puff Inhalation Daily   Continuous Infusions: . sodium chloride    . famotidine (PEPCID) IV    . heparin 1,050 Units/hr (05/28/20 0418)  . remdesivir 100 mg in NS 100 mL 100 mg (05/28/20 0857)   PRN Meds:.sodium chloride, acetaminophen **OR** [DISCONTINUED] acetaminophen, albuterol, diphenhydrAMINE, EPINEPHrine, famotidine (PEPCID) IV, melatonin  SUBJECTIVE:   24 hour events:  No acute events noted Afebrile, T-max 97.7 Nasal cannula at 7 to 9 L/min AFB smears and cultures pending, fungal culture pending PCP smear pending Fungitell pending  Patient reports improvement in shortness of breath this morning.  No fevers.  He is tolerating p.o. intake without issues.  He is inquiring about transfer to a tertiary care center such as [facility] or [facility].  Review of Systems  All other systems reviewed and are negative.     OBJECTIVE:   Blood pressure (!) 137/105, pulse (!) 108, temperature 97.7 F (36.5 C), temperature source Oral, resp. rate 19, height 6\' 2"  (1.88 m), weight 88 kg, SpO2 97 %. Body mass index is 24.91 kg/m.  Physical Exam Constitutional:      General: He is not in acute distress.    Appearance: Normal appearance.     Comments: Sitting up in the bed currently eating breakfast  HENT:  Head: Normocephalic and atraumatic.  Pulmonary:     Effort: Pulmonary effort is normal. No respiratory distress.     Comments: On nasal cannula Skin:    General: Skin is warm and dry.  Neurological:     General: No focal deficit present.     Mental Status: He is alert and oriented to person, place, and time.  Psychiatric:        Mood and Affect: Mood  normal.        Behavior: Behavior normal.      Lab Results: Lab Results  Component Value Date   WBC 10.4 05/28/2020   HGB 10.7 (L) 05/28/2020   HCT 33.2 (L) 05/28/2020   MCV 89.0 05/28/2020   PLT PLATELET CLUMPS NOTED ON SMEAR, UNABLE TO ESTIMATE 05/28/2020    Lab Results  Component Value Date   NA 130 (L) 05/28/2020   K 4.7 05/28/2020   CO2 21 (L) 05/28/2020   GLUCOSE 554 (HH) 05/28/2020   BUN 24 (H) 05/28/2020   CREATININE 1.09 05/28/2020   CALCIUM 9.0 05/28/2020   GFRNONAA >60 05/28/2020   GFRAA 48 (L) 02/18/2018    Lab Results  Component Value Date   ALT 24 05/28/2020   AST 29 05/28/2020   ALKPHOS 82 05/28/2020   BILITOT 0.3 05/28/2020       Component Value Date/Time   CRP 4.4 (H) 05/28/2020 0047       Component Value Date/Time   ESRSEDRATE 63 (H) 05/17/2020 0201     I have reviewed the micro and lab results in Epic.  Imaging: DG Chest Port 1 View  Result Date: 05/28/2020 CLINICAL DATA:  Shortness of breath EXAM: PORTABLE CHEST 1 VIEW COMPARISON:  Three days ago FINDINGS: Patchy bilateral pulmonary infiltrate without convincing progression. Lung volumes are low. No visible effusion or air leak. Normal heart size and stable mediastinal contours. IMPRESSION: Similar degree of bilateral pulmonary infiltrates. No new abnormality. Electronically Signed   By: Monte Fantasia M.D.   On: 05/28/2020 07:38   ECHOCARDIOGRAM COMPLETE  Result Date: 05/26/2020    ECHOCARDIOGRAM REPORT   Patient Name:   Tyrone Roman Date of Exam: 05/26/2020 Medical Rec #:  [id number]      Height:       74.0 in Accession #:    [id number]     Weight:       194.0 lb Date of Birth:  03-01-1945      BSA:          2.146 m Patient Age:    75 years       BP:           129/92 mmHg Patient Gender: M              HR:           84 bpm. Exam Location:  Inpatient Procedure: 2D Echo, Cardiac Doppler, Color Doppler and Intracardiac            Opacification Agent Indications:    Pulmonary Embolus I26.09   History:        Patient has prior history of Echocardiogram examinations, most                 recent 04/03/2020. CAD and Previous Myocardial Infarction, COPD,                 Signs/Symptoms:Shortness of Breath; Risk Factors:Hypertension                 and Dyslipidemia.  COVID 19. Sepsis, due to unspecified organism,                 unspecified whether acute organ dysfunction present                 Multiple subsegmental pulmonary emboli without acute cor                 pulmonale.  Sonographer:    Darlina Sicilian RDCS Referring Phys: [id number] [place]  1. Left ventricular ejection fraction, by estimation, is 50 to 55%. The left ventricle has low normal function. The left ventricle has no regional wall motion abnormalities. There is severe asymmetric left ventricular hypertrophy of the basal and septal  segments. Left ventricular diastolic parameters are consistent with Grade II diastolic dysfunction (pseudonormalization). Elevated left ventricular end-diastolic pressure.  2. Right ventricular systolic function is normal. The right ventricular size is normal.  3. The mitral valve is abnormal. No evidence of mitral valve regurgitation. No evidence of mitral stenosis.  4. The aortic valve is tricuspid. There is mild calcification of the aortic valve. Aortic valve regurgitation is not visualized. Mild aortic valve sclerosis is present, with no evidence of aortic valve stenosis.  5. Aortic dilatation noted. There is mild dilatation of the aortic root, measuring 41 mm.  6. The inferior vena cava is normal in size with greater than 50% respiratory variability, suggesting right atrial pressure of 3 mmHg. FINDINGS  Left Ventricle: Left ventricular ejection fraction, by estimation, is 50 to 55%. The left ventricle has low normal function. The left ventricle has no regional wall motion abnormalities. Definity contrast agent was given IV to delineate the left ventricular endocardial borders. The left  ventricular internal cavity size was normal in size. There is severe asymmetric left ventricular hypertrophy of the basal and septal segments. Left ventricular diastolic parameters are consistent with Grade II diastolic dysfunction (pseudonormalization). Elevated left ventricular end-diastolic pressure. Right Ventricle: The right ventricular size is normal. No increase in right ventricular wall thickness. Right ventricular systolic function is normal. Left Atrium: Left atrial size was normal in size. Right Atrium: Right atrial size was normal in size. Pericardium: There is no evidence of pericardial effusion. Mitral Valve: The mitral valve is abnormal. There is mild thickening of the mitral valve leaflet(s). There is mild calcification of the mitral valve leaflet(s). Mild mitral annular calcification. No evidence of mitral valve regurgitation. No evidence of mitral valve stenosis. Tricuspid Valve: The tricuspid valve is normal in structure. Tricuspid valve regurgitation is not demonstrated. No evidence of tricuspid stenosis. Aortic Valve: The aortic valve is tricuspid. There is mild calcification of the aortic valve. Aortic valve regurgitation is not visualized. Mild aortic valve sclerosis is present, with no evidence of aortic valve stenosis. Pulmonic Valve: The pulmonic valve was normal in structure. Pulmonic valve regurgitation is not visualized. No evidence of pulmonic stenosis. Aorta: The aortic root is normal in size and structure and aortic dilatation noted. There is mild dilatation of the aortic root, measuring 41 mm. Venous: The inferior vena cava is normal in size with greater than 50% respiratory variability, suggesting right atrial pressure of 3 mmHg. IAS/Shunts: No atrial level shunt detected by color flow Doppler.  LEFT VENTRICLE PLAX 2D LVIDd:         3.20 cm     Diastology LVIDs:         2.60 cm     LV e' medial:    3.15 cm/s LV PW:  1.40 cm     LV E/e' medial:  17.1 LV IVS:        2.00 cm      LV e' lateral:   3.37 cm/s LVOT diam:     2.00 cm     LV E/e' lateral: 16.0 LV SV:         41 LV SV Index:   19 LVOT Area:     3.14 cm  LV Volumes (MOD) LV vol d, MOD A2C: 91.2 ml LV vol d, MOD A4C: 78.4 ml LV vol s, MOD A2C: 43.8 ml LV vol s, MOD A4C: 36.8 ml LV SV MOD A2C:     47.4 ml LV SV MOD A4C:     78.4 ml LV SV MOD BP:      44.4 ml RIGHT VENTRICLE RV S prime:     8.16 cm/s LEFT ATRIUM             Index       RIGHT ATRIUM          Index LA diam:        3.00 cm 1.40 cm/m  RA Area:     6.38 cm LA Vol (A2C):   20.1 ml 9.37 ml/m  RA Volume:   7.69 ml  3.58 ml/m LA Vol (A4C):   21.3 ml 9.93 ml/m LA Biplane Vol: 21.6 ml 10.07 ml/m  AORTIC VALVE LVOT Vmax:   80.50 cm/s LVOT Vmean:  60.800 cm/s LVOT VTI:    0.130 m  AORTA Ao Root diam: 4.10 cm Ao Asc diam:  3.40 cm MITRAL VALVE MV Area (PHT): 3.01 cm    SHUNTS MV Decel Time: 252 msec    Systemic VTI:  0.13 m MV E velocity: 53.80 cm/s  Systemic Diam: 2.00 cm MV A velocity: 81.50 cm/s MV E/A ratio:  0.66 Jenkins Rouge MD Electronically signed by Jenkins Rouge MD Signature Date/Time: 05/26/2020/2:30:51 PM    Final    VAS [place] LOWER EXTREMITY VENOUS (DVT)  Result Date: 05/26/2020  Lower Venous DVT Study Indications: Edema, and Swelling.  Comparison Study: 05/11/20 previous Performing Technologist: Abram Sander RVS  Examination Guidelines: A complete evaluation includes B-mode imaging, spectral Doppler, color Doppler, and power Doppler as needed of all accessible portions of each vessel. Bilateral testing is considered an integral part of a complete examination. Limited examinations for reoccurring indications may be performed as noted. The reflux portion of the exam is performed with the patient in reverse Trendelenburg.  +---------+---------------+---------+-----------+----------+--------------+ RIGHT    CompressibilityPhasicitySpontaneityPropertiesThrombus Aging +---------+---------------+---------+-----------+----------+--------------+ CFV      Full            Yes      Yes                                 +---------+---------------+---------+-----------+----------+--------------+ SFJ      Full                                                        +---------+---------------+---------+-----------+----------+--------------+ FV Prox  Full                                                        +---------+---------------+---------+-----------+----------+--------------+  FV Mid   Full                                                        +---------+---------------+---------+-----------+----------+--------------+ FV DistalFull                                                        +---------+---------------+---------+-----------+----------+--------------+ PFV      Full                                                        +---------+---------------+---------+-----------+----------+--------------+ POP      Full           Yes      Yes                                 +---------+---------------+---------+-----------+----------+--------------+ PTV      Full                                                        +---------+---------------+---------+-----------+----------+--------------+ PERO     Full                                                        +---------+---------------+---------+-----------+----------+--------------+   +---------+---------------+---------+-----------+----------+-----------------+ LEFT     CompressibilityPhasicitySpontaneityPropertiesThrombus Aging    +---------+---------------+---------+-----------+----------+-----------------+ CFV      Full           Yes      Yes                                    +---------+---------------+---------+-----------+----------+-----------------+ SFJ      Full                                                           +---------+---------------+---------+-----------+----------+-----------------+ FV Prox  Full                                                            +---------+---------------+---------+-----------+----------+-----------------+ FV Mid   Full                                                           +---------+---------------+---------+-----------+----------+-----------------+  FV DistalFull                                                           +---------+---------------+---------+-----------+----------+-----------------+ PFV      Full                                                           +---------+---------------+---------+-----------+----------+-----------------+ POP      None           No       No                   Age Indeterminate +---------+---------------+---------+-----------+----------+-----------------+ PTV      None                                         Age Indeterminate +---------+---------------+---------+-----------+----------+-----------------+ PERO     None                                         Age Indeterminate +---------+---------------+---------+-----------+----------+-----------------+     Summary: RIGHT: - There is no evidence of deep vein thrombosis in the lower extremity.  - No cystic structure found in the popliteal fossa.  LEFT: - Findings consistent with age indeterminate deep vein thrombosis involving the left popliteal vein, left posterior tibial veins, and left peroneal veins. - No cystic structure found in the popliteal fossa.  *See table(s) above for measurements and observations. Electronically signed by Harold Barban MD on 05/26/2020 at 9:28:41 PM.    Final      Imaging independently reviewed in Epic.    [facility] for [facility]ious [facility] [phone] pager 05/28/2020, 9:40 AM  I spent greater than 35 minutes with the patient including greater than 50% of time in face to face counsel of the patient and in coordination of their care.

## 2020-05-28 NOTE — Progress Notes (Signed)
[place] for heparin Indication: pulmonary embolus  Allergies  Allergen Reactions  . Cephalexin Shortness Of Breath, Rash and Other (See Comments)    Tolerated Augmentin 2015 and 2017 (12-2017). Tolerated nafcillin 12/2017 PATIENT HAS HAD A PCN REACTION WITH IMMEDIATE RASH, FACIAL/TONGUE/THROAT SWELLING, SOB, OR LIGHTHEADEDNESS WITH HYPOTENSION:  #  #  YES  #  #  Has patient had a PCN reaction causing severe rash involving mucus membranes or skin necrosis: No Has patient had a PCN reaction that required hospitalization: No Has patient had a PCN reaction occurring within the last 10 years: No If all of the above answers are "NO", then may proceed  . Clarithromycin Shortness Of Breath and Rash  . Certolizumab Pegol Hives  . Tocilizumab Hives  . Doxycycline Rash  . Hydroxyzine Rash  . Lidoderm Other (See Comments)    "made me act weird"  . Pyrithione Zinc Rash    Patient Measurements: Height: 6\' 2"  (188 cm) Weight: 88 kg (194 lb 0.1 oz) (Per 4/13 note) IBW/kg (Calculated) : 82.2 Heparin Dosing Weight: TBW  Vital Signs: Temp: 97.5 F (36.4 C) (04/23 1053) Temp Source: Rectal (04/23 1053) BP: 139/86 (04/23 1053) Pulse Rate: 77 (04/23 1053)  Labs: Recent Labs    05/25/20 1547 05/25/20 1626 05/25/20 1931 05/25/20 2231 05/26/20 0141 05/26/20 1303 05/27/20 0054 05/27/20 1301 05/28/20 0000 05/28/20 0047 05/28/20 1335  HGB 11.5* 12.2*  --   --  10.1*  --  12.6*  --   --  10.7*  --   HCT 36.6* 36.0*  --   --  32.2*  --  38.1*  --   --  33.2*  --   PLT 155  --   --   --  126*  --  203  --   --  PLATELET CLUMPS NOTED ON SMEAR, UNABLE TO ESTIMATE  --   APTT 34  --   --   --  75*   < > 29 58*  --  142*  --   LABPROT 19.8*  --   --   --   --   --   --   --   --   --   --   INR 1.7*  --   --   --   --   --   --   --   --   --   --   HEPARINUNFRC  --   --   --   --  >1.10*   < > <0.10*  --  1.08* >1.10* 0.80*  CREATININE 0.90 0.80  --   --    --   --  0.80  --   --  1.09  --   TROPONINIHS  --   --  67* 46*  --   --   --   --   --   --   --    < > = values in this interval not displayed.    Estimated Creatinine Clearance: 69.1 mL/min (by C-G formula based on SCr of 1.09 mg/dL).   Medical History: Past Medical History:  Diagnosis Date  . Allergy   . Barrett's esophageal ulceration   . BPH (benign prostatic hyperplasia)   . CAD (coronary artery disease)    sees Dr. Mar Daring  . Cataract    both eyes removed   . Colonic polyp   . Concussion with loss of consciousness of 30 minutes or less   .  Contact dermatitis   . Contact with or exposure to venereal diseases   . COPD (chronic obstructive pulmonary disease) ([place])    sees Dr. Kara Mead   . Diverticulosis of colon   . Dysphagia   . GERD (gastroesophageal reflux disease)    past hx- on meds   . HNP (herniated nucleus pulposus), lumbar    recurrent  . Hyperlipidemia   . Hypertension   . Myocardial infarction [facility])    2011- stent placed  . Nephrolithiasis   . Rheumatoid arthritis [facility])    sees Dr. Gavin Pound   . SOB (shortness of breath)    Assessment: 39 YOM presenting with worsening SOB and fever, previously admitted with new PE and discharged on Eliquis w/7d load, now on 5mg  BID. Last dose apix on 4/20 AM.   Hep gtt for concern of worsening PE. Hgb is stable from 4/21, PLTs clumped and unable to be analyzed. This morning was supratherapeutic, now 9 hours post dose change heparin level remains elevated in the setting of recent apixaban. APTT is therapeutic at 74. No signs of bleeding or infusion issues noted. Labs drawn correctly.    Goal of Therapy:  Heparin level 0.3-0.7 units/ml aPTT 66-102 seconds Monitor platelets by anticoagulation protocol: Yes   Plan:  Continue heparin to 1050 units/hr 2000 aPTT/heparin level Daily CBC Monitor for signs and symptoms of bleeding  Norina Buzzard, PharmD PGY1 Pharmacy Resident 05/28/2020 2:39 PM

## 2020-05-28 NOTE — Progress Notes (Addendum)
PROGRESS NOTE                                                                                                                                                                                                             Patient Demographics:    Tyrone Roman, is a 75 y.o. male, DOB - November 22, 1945, [id number]  Outpatient Primary MD for the patient is [name], MD   Admit date - 05/25/2020   LOS - 3  Chief Complaint  Patient presents with  . Code Sepsis       Brief Narrative: Patient is a 75 y.o. male with PMHx of RA, CAD s/p PCI, HTN, HLD, COPD, BPH-who had COVID-19 infection in January 2021-since then has had numerous hospitalizations for shortness of breath-FUO-most recently discharged from 4/4-4/13-after extensive work-up including ID evaluation-found to have PE/DVT-maintained on prednisone 30 mg p.o. twice daily (fever responsive to steroids per prior notes)-presenting back to the hospital with 4-5-day history of recurrent fever (as high as 103 F) and worsening shortness of breath.  See below for further details.  Summary of prior Hospitalizations: 1/24 - 1/26>> admit for COVID PNA- received IV steroids/remdesivir-No baricitinib/Actemra due to underling immunosuppressants. Seen with Coag-neg staph bacteremia, ID seen with no reccs for ABT's 2/10 - 2/11>> Readmitted recurrent SOB/hypoxia with substernal CP. Concern that steroid taper was completed to quickly, patient was placed on 10 day taper. 2/18 - 2/25>> Readmitted for recurrent SOB/hypoxia. COVID PCR remained positive. Negative for PE. Received zosyn/vanc with switch to PO Augmentin at d/c. Treated with short steroid taper  2/27 - 3/2>> Readmitted and treated for SOB with fever and AMS. ? Aspiration PNA and received empiric Unasyn and switched to Augmentin on d/c 4/4 - 4/13>> Readmitted from [facility] forhypoxic respiratory failure to be due to acute on chronic  congestive heart failure. COVID PCR positive.He received broad spectrum ABT which were eventually d/c due to no obvious cause.  Had FUO of unknown etiology-with concern for possible vasculities that temporary resolves with higher doses of IV steroids. D/C'd on 30mg  BID prednisone and Eliquis for new acute PE seen on CTA chest  COVID-19 vaccinated status: Vaccinated including booster ( COVID antibodies undetectable x 2 in spite of vaccine/natural infection)  Significant Events: 4/20>> Admit to [facility] for recurrent fever/shortness of breath-significant infiltrates on CT chest-worsening clot burden.  Requiring anywhere from 10-12 L of oxygen. 4/21>> MAB infusion.  Significant studie 4/21>>> Serum SARS-COV-2 antibody-IgG: Negative 4/21>>Serum Fungitell:pending 4/20>> CTA chest: PE extending from right pulmonary artery into lobar/segmental branches, significant bilateral pulmonary infiltrates. 4/21>> B/L lower ext Doppler: Left popliteal- posterior tibial-peroneal vein DVT(Previously B/L DVT on 4/6) 4/21>> Echo: EF [id number], RV systolic function normal. 4/13>> MPO/ANCA: Negative 4/12>> SPEP: No M spike 4/10>> RA factor: 12.9 (WNL) 4/10>> ACE: 34 (WNL) 4/9>> GOLD QuantiFERON: Negative 4/9>> CMV IgG/IgM: Negative 4/9>> EBV IgM: Negative 4/9>> ANA: Negative 4/9>> Serum cryptococcal/histoplasma/blastomycosis antigen: Negative 4/9>> Serum SARS-COV-2 antibody-IgG: Negative 4/6>> bilateral lower extremity Doppler: Bilateral lower extremity DVT 2/27>> Echo: EF 60-65%  COVID-19 medications: Remdesivir: 4/21>> IV Solu-Medrol: 4/21>> Bebtelovimab: 4/21 x 1  Antibiotics: Vancomycin: 4/20 x 1 Flagyl: 4/20>> 4/21 Cefepime: 4/20 x 1  Microbiology data: 4/21>> sputum AFB smear/culture, PCP smear and culture sensitivity: Ordered 4/20 >>blood culture: No growth 4/20>> SARS 2 coronavirus PCR: Positive with cycle threshold of 24.2 4/4>> SARS 2 coronavirus PCR: Positive with cycle threshold of  29.6 2/18>> SARS 2 coronavirus PCR: Positive with cycle threshold of 37.5  Procedures: None  Consults: ID, PCCM  DVT prophylaxis: IV heparin    Subjective:   Patient in bed denies any headache chest or abdominal pain, shortness of breath has improved and overall feels better today.   Assessment  & Plan :   Acute hypoxic respiratory failure: Multifactorial etiology-worsening clot burden/PE-likely ongoing/acute COVID-19 infection-superimposed significant bilateral pulmonary infiltrates (autoimmune/COVID inflammation versus opportunistic infection).  Remains on IV heparin-previously on oral prednisone as outpatient-has been escalated to IV Solu-Medrol-and has been started on Remdesivir.  No signs of volume overload-but will get 1 dose of IV Lasix to try and keep an negative balance.  ID/PCCM following-some mild improvement in hypoxemia compared to yesterday-Down to 7-9 L of HFNC today (was on 12 L HFNC yesterday) .  DW [facility] transfer center 05/28/20 - Dr L.Wahid, nothing else to offer agrees with management.   SpO2: 97 % O2 Flow Rate (L/min): 9 L/min    Acute vs persistent COVID-19 infection: Continues to test positive for COVID-19 since January-however last 2 tests in April-with significant cycle threshold-that continues to indicate the patient is infectious with Cycle counts < 30.  He unfortunately does not have serum antibodies against COVID in spite of being vaccinated and having a natural infection.  There is some suspicion-that ongoing fever/worsening hypoxemia/worsening clot burden could be from a new COVID infection that he may have picked up earlier this month.  He remains on steroids and Remdesivir-he was given 1 dose of monoclonal antibody on 4/21 (after significant discussion with ID/PCCM - Bebtelovimab ).  Some mild improvement in hypoxemia overnight.  Given concern for opportunistic infection-severe immunosuppression-probably will need to hold off on Actemra/baricitinib.     Bilateral pulmonary infiltrates:  Etiology of these infiltrates are clear-given significant steroid use over the past several months-history of Rituxan use (last use either late December/early Jan)-concerned that these may represent opportunistic infections-and since his fever/encephalopathy have been so steroid responsive in the past-possible that this could be autoimmune/inflammation related to RA/COVID.  Ideally needs a bronchoscopy-however given severity of hypoxemia-it cannot be performed given risk that he may end up on a ventilator.  Currently empirically being treated for COVID-19 with steroid/Remdesivir-plan is to follow closely-and initiate empiric coverage against opportunistic infections (PCP/fungal/MAI etc.) if clinical condition deteriorates.  ID and PCCM following.  I have asked nursing staff to ensure that sputum studies for PCP/AFB/bacterial cultures get sent out  today.  Pulmonary embolism/DVT: Has significant clot burden on CT done on 4/20-in spite of being on Eliquis-lower extremity Doppler on 4/21 only shows left lower extremity DVT-a prior Doppler on 4/6 showed bilateral DVT.  Suspect that in spite of being on anticoagulation-could have had further embolic event from his legs to the lung-or he could have failed Eliquis.  Remains on IV heparin for now-once he is clinically more stable-and we are sure that he will not require any procedures-plan is to transition him to subcu Lovenox-to continue on discharge as he has likely failed DOAC therapy.  Echo on 4/20 without any RV strain.  FUO: Unclear etiology-this has been ongoing since his last admission-Per prior notes-fever-overall clinical condition has been steroid responsive-prior hospitalist MD-Dr. Waldron [facility] had spoken e with patient's primary rheumatologist-and recommendations were to keep on prednisone 30 mg p.o. twice daily.  In spite of being on prednisone-patient continues to spike fevers.  Multiple differentials for fever at this  point-possible new COVID-19 infection-worsening clot burden-and more worrisome is that it could be from a opportunistic infection given significant pulmonary infiltrates.  ID following-culture data-work-up as above.  All antibiotics have been discontinued as of 4/21.  Chronic diastolic heart failure: Euvolemic on exam-await echo-we will keep in negative balance and dose with diuretics periodically.  Hyponatremia: Euvolemic on exam-could have SIADH pathophysiology-improved-continue to watch closely.  Dose with as needed doses of IV Lasix.  HTN: BP is stable-resume antihypertensives over the next few days.  HLD: Continue statin  History of rheumatoid arthritis: Has been on prednisone for the past several months-see above.  COPD: Continue inhaler regimen  GERD: Continue PPI  BPH: Continue Flomax  Debility/deconditioning: Due to acute on chronic illness-await PT/OT eval.  Previously had refused SNF.    Condition - Extremely Guarded  Family Communication  :  Cameron-[phone]-left a voicemail on 4/21, 4/22  Code Status :  Full Code  Diet :  Diet Order            Diet Heart Room service appropriate? Yes with Assist; Fluid consistency: Thin  Diet effective now                  Disposition Plan  :   Status is: Inpatient  Remains inpatient appropriate because:Inpatient level of care appropriate due to severity of illness   Dispo: The patient is from: Home              Anticipated d/c is to: TBD              Patient currently is not medically stable to d/c.   Difficult to place patient No    Barriers to discharge: Hypoxia requiring O2 supplementation/complete 5 days of IV Remdesivir  Antimicorbials  :    Anti-infectives (From admission, onward)   Start     Dose/Rate Route Frequency Ordered Stop   05/27/20 1000  remdesivir 100 mg in sodium chloride 0.9 % 100 mL IVPB  Status:  Discontinued       "Followed by" Linked Group Details   100 mg 200 mL/hr over 30  Minutes Intravenous Daily 05/26/20 1134 05/26/20 1143   05/27/20 1000  remdesivir 100 mg in sodium chloride 0.9 % 100 mL IVPB       "Followed by" Linked Group Details   100 mg 200 mL/hr over 30 Minutes Intravenous Daily 05/26/20 1143 05/31/20 0959   05/26/20 1300  remdesivir 100 mg in sodium chloride 0.9 % 100 mL IVPB  Status:  Discontinued       "  Followed by" Linked Group Details   100 mg 200 mL/hr over 30 Minutes Intravenous  Once 05/26/20 1134 05/26/20 1143   05/26/20 1200  remdesivir 200 mg in sodium chloride 0.9% 250 mL IVPB       "Followed by" Linked Group Details   200 mg 580 mL/hr over 30 Minutes Intravenous Once 05/26/20 1143 05/26/20 1503   05/26/20 0500  vancomycin (VANCOREADY) IVPB 1250 mg/250 mL  Status:  Discontinued        1,250 mg 166.7 mL/hr over 90 Minutes Intravenous Every 12 hours 05/25/20 1748 05/26/20 1108   05/26/20 0000  ceFEPIme (MAXIPIME) 2 g in sodium chloride 0.9 % 100 mL IVPB  Status:  Discontinued        2 g 200 mL/hr over 30 Minutes Intravenous Every 8 hours 05/25/20 1745 05/26/20 1108   05/25/20 2315  metroNIDAZOLE (FLAGYL) IVPB 500 mg  Status:  Discontinued        500 mg 100 mL/hr over 60 Minutes Intravenous Every 8 hours 05/25/20 2223 05/26/20 1108   05/25/20 1600  vancomycin (VANCOREADY) IVPB 1000 mg/200 mL  Status:  Discontinued        1,000 mg 200 mL/hr over 60 Minutes Intravenous  Once 05/25/20 1548 05/25/20 1555   05/25/20 1600  aztreonam (AZACTAM) 2 g in sodium chloride 0.9 % 100 mL IVPB  Status:  Discontinued        2 g 200 mL/hr over 30 Minutes Intravenous  Once 05/25/20 1548 05/25/20 1552   05/25/20 1600  ceFEPIme (MAXIPIME) 2 g in sodium chloride 0.9 % 100 mL IVPB  Status:  Discontinued        2 g 200 mL/hr over 30 Minutes Intravenous Every 8 hours 05/25/20 1555 05/25/20 1557   05/25/20 1600  vancomycin (VANCOREADY) IVPB 2000 mg/400 mL        2,000 mg 200 mL/hr over 120 Minutes Intravenous  Once 05/25/20 1557 05/25/20 1918   05/25/20 1600   ceFEPIme (MAXIPIME) 2 g in sodium chloride 0.9 % 100 mL IVPB        2 g 200 mL/hr over 30 Minutes Intravenous  Once 05/25/20 1557 05/25/20 1647      Inpatient Medications  Scheduled Meds: . insulin aspart  0-20 Units Subcutaneous TID WC  . insulin aspart  0-5 Units Subcutaneous QHS  . insulin aspart  6 Units Subcutaneous TID WC  . insulin glargine  20 Units Subcutaneous Daily  . methylPREDNISolone (SOLU-MEDROL) injection  40 mg Intravenous Q12H  . metoprolol tartrate  25 mg Oral Once  . metoprolol tartrate  50 mg Oral BID  . mometasone-formoterol  2 puff Inhalation BID  . pantoprazole  40 mg Oral Daily  . simvastatin  20 mg Oral Daily  . tamsulosin  0.4 mg Oral Daily  . umeclidinium bromide  1 puff Inhalation Daily   Continuous Infusions: . sodium chloride    . famotidine (PEPCID) IV    . heparin 1,050 Units/hr (05/28/20 0418)  . remdesivir 100 mg in NS 100 mL 100 mg (05/28/20 0857)   PRN Meds:.sodium chloride, acetaminophen **OR** [DISCONTINUED] acetaminophen, albuterol, diphenhydrAMINE, EPINEPHrine, famotidine (PEPCID) IV, melatonin   Time Spent in minutes  45  See all Orders from today for further details   Lala Lund M.D on 05/28/2020 at 10:14 AM  To page go to [email or web address] - use universal password  [facility] -  Office  [phone]    Objective:   Vitals:   05/27/20 2016 05/27/20 2359 05/28/20 [id number] 05/28/20 [id number]  BP: 128/90 137/87 (!) 145/99 (!) 137/105  Pulse: (!) 103 98 100 (!) 108  Resp: 20 20 20 19   Temp: 97.6 F (36.4 C) (!) 97.5 F (36.4 C) 97.7 F (36.5 C)   TempSrc: Oral Oral Oral   SpO2: 97% 99% 99% 97%  Weight:      Height:        Wt Readings from Last 3 Encounters:  05/25/20 88 kg  05/09/20 88 kg  05/05/20 88.5 kg     Intake/Output Summary (Last 24 hours) at 05/28/2020 1014 Last data filed at 05/28/2020 0900 Gross per 24 hour  Intake 451.02 ml  Output 3800 ml  Net -3348.98 ml     Physical Exam  Awake Alert, No new  F.N deficits, frail elderly white male lying in hospital bed in no distress, [place].AT,PERRAL Supple Neck,No JVD, No cervical lymphadenopathy appriciated.  Symmetrical Chest wall movement, Good air movement bilaterally, CTAB RRR,No Gallops, Rubs or new Murmurs, No Parasternal Heave +ve B.Sounds, Abd Soft, No tenderness, No organomegaly appriciated, No rebound - guarding or rigidity. No Cyanosis, Clubbing or edema, No new Rash or bruise    Data Review:    CBC Recent Labs  Lab 05/25/20 1547 05/25/20 1626 05/26/20 0141 05/27/20 0054 05/28/20 0047  WBC 11.6*  --  7.9 8.0 10.4  HGB 11.5* 12.2* 10.1* 12.6* 10.7*  HCT 36.6* 36.0* 32.2* 38.1* 33.2*  PLT 155  --  126* 203 PLATELET CLUMPS NOTED ON SMEAR, UNABLE TO ESTIMATE  MCV 89.3  --  90.2 95.3 89.0  MCH 28.0  --  28.3 31.5 28.7  MCHC 31.4  --  31.4 33.1 32.2  RDW 20.5*  --  20.2* 12.0 20.0*  LYMPHSABS 0.2*  --   --   --   --   MONOABS 0.2  --   --   --   --   EOSABS 0.0  --   --   --   --   BASOSABS 0.0  --   --   --   --     Chemistries  Recent Labs  Lab 05/25/20 1547 05/25/20 1626 05/25/20 2231 05/27/20 0054 05/28/20 0047 05/28/20 0300  NA 129* 128*  --  133* 130*  --   K 4.6 4.7  --  4.8 4.7  --   CL 98 99  --  99 97*  --   CO2 21*  --   --  28 21*  --   GLUCOSE 225* 228*  --  110* 554*  --   BUN 21 21  --  16 24*  --   CREATININE 0.90 0.80  --  0.80 1.09  --   CALCIUM 9.5  --   --  8.9 9.0  --   MG  --   --  1.7  --   --  2.0  AST 29  --   --  34 29  --   ALT 27  --   --  27 24  --   ALKPHOS 76  --   --  58 82  --   BILITOT 0.8  --   --  0.8 0.3  --    ------------------------------------------------------------------------------------------------------------------ No results for input(s): CHOL, HDL, LDLCALC, TRIG, CHOLHDL, LDLDIRECT in the last 72 hours.  Lab Results  Component Value Date   HGBA1C 6.3 (H) 05/12/2020    ------------------------------------------------------------------------------------------------------------------ No results for input(s): TSH, T4TOTAL, T3FREE, THYROIDAB in the last 72 hours.  Invalid input(s): FREET3 ------------------------------------------------------------------------------------------------------------------ Recent Labs  05/26/20 0141  FERRITIN 1,372*    Coagulation profile Recent Labs  Lab 05/25/20 1547  INR 1.7*    Recent Labs    05/26/20 0141  DDIMER 1.22*    Cardiac Enzymes No results for input(s): CKMB, TROPONINI, MYOGLOBIN in the last 168 hours.  Invalid input(s): CK ------------------------------------------------------------------------------------------------------------------    Component Value Date/Time   BNP 219.5 (H) 05/28/2020 0300    Micro Results Recent Results (from the past 240 hour(s))  Resp Panel by RT-PCR (Flu A&B, Covid) Nasopharyngeal Swab     Status: Abnormal   Collection Time: 05/25/20  3:56 PM   Specimen: Nasopharyngeal Swab; Nasopharyngeal(NP) swabs in vial transport medium  Result Value Ref Range Status   SARS Coronavirus 2 by RT PCR POSITIVE (A) NEGATIVE Final    Comment: RESULT CALLED TO, READ BACK BY AND VERIFIED WITH: Sherlean Foot RN 2032 05/25/20 A BROWNING (NOTE) SARS-CoV-2 target nucleic acids are DETECTED.  The SARS-CoV-2 RNA is generally detectable in upper respiratory specimens during the acute phase of infection. Positive results are indicative of the presence of the identified virus, but do not rule out bacterial infection or co-infection with other pathogens not detected by the test. Clinical correlation with patient history and other diagnostic information is necessary to determine patient infection status. The expected result is Negative.  Fact Sheet for Patients: EntrepreneurPulse.com.au  Fact Sheet for Healthcare Providers: IncredibleEmployment.be  This  test is not yet approved or cleared by the [place] FDA and  has been authorized for detection and/or diagnosis of SARS-CoV-2 by FDA under an Emergency Use Authorization (EUA).  This EUA will remain in effect (meaning this test can  be used) for the duration of  the COVID-19 declaration under Section 564(b)(1) of the Act, 21 U.S.C. section 360bbb-3(b)(1), unless the authorization is terminated or revoked sooner.     Influenza A by PCR NEGATIVE NEGATIVE Final   Influenza B by PCR NEGATIVE NEGATIVE Final    Comment: (NOTE) The Xpert Xpress SARS-CoV-2/FLU/RSV plus assay is intended as an aid in the diagnosis of influenza from Nasopharyngeal swab specimens and should not be used as a sole basis for treatment. Nasal washings and aspirates are unacceptable for Xpert Xpress SARS-CoV-2/FLU/RSV testing.  Fact Sheet for Patients: EntrepreneurPulse.com.au  Fact Sheet for Healthcare Providers: IncredibleEmployment.be  This test is not yet approved or cleared by the [place] FDA and has been authorized for detection and/or diagnosis of SARS-CoV-2 by FDA under an Emergency Use Authorization (EUA). This EUA will remain in effect (meaning this test can be used) for the duration of the COVID-19 declaration under Section 564(b)(1) of the Act, 21 U.S.C. section 360bbb-3(b)(1), unless the authorization is terminated or revoked.  Performed at [facility], [place] [address] [place], [place] [address]   Blood Culture (routine x 2)     Status: None (Preliminary result)   Collection Time: 05/25/20  3:59 PM   Specimen: BLOOD  Result Value Ref Range Status   Specimen Description BLOOD SITE NOT SPECIFIED  Final   Special Requests   Final    BOTTLES DRAWN AEROBIC AND ANAEROBIC Blood Culture results may not be optimal due to an excessive volume of blood received in culture bottles   Culture   Final    NO GROWTH 3 DAYS Performed at [facility], [place] [address] [place], [place] [address]    Report Status PENDING  Incomplete  Blood Culture (routine x 2)     Status: None (Preliminary result)  Collection Time: 05/25/20  6:18 PM   Specimen: BLOOD RIGHT HAND  Result Value Ref Range Status   Specimen Description BLOOD RIGHT HAND  Final   Special Requests   Final    BOTTLES DRAWN AEROBIC AND ANAEROBIC Blood Culture adequate volume   Culture   Final    NO GROWTH 3 DAYS Performed at [facility], [address] [place], [place] [address]    Report Status PENDING  Incomplete  Urine culture     Status: Abnormal (Preliminary result)   Collection Time: 05/25/20  7:16 PM   Specimen: In/Out Cath Urine  Result Value Ref Range Status   Specimen Description IN/OUT CATH URINE  Final   Special Requests NONE  Final   Culture (A)  Final    20,000 COLONIES/mL STAPHYLOCOCCUS EPIDERMIDIS SUSCEPTIBILITIES TO FOLLOW Performed at [facility], [place] [address] [place], [place] [address]    Report Status PENDING  Incomplete  Expectorated Sputum Assessment w Gram Stain, Rflx to Resp Cult     Status: None   Collection Time: 05/27/20  1:15 PM   Specimen: Expectorated Sputum  Result Value Ref Range Status   Specimen Description EXPECTORATED SPUTUM  Final   Special Requests NONE  Final   Sputum evaluation   Final    Sputum specimen not acceptable for testing.  Please recollect.   RESULT CALLED TO, READ BACK BY AND VERIFIED WITH: A PINCHER RN 1425 05/27/20 A BROWNING Performed at [facility], [place] [address] [place], [place] [address]    Report Status 05/27/2020 FINAL  Final    Radiology Reports CT Angio Chest PE W and/or Wo Contrast  Result Date: 05/25/2020 CLINICAL DATA:  75 year old male with shortness of breath and chest pain. Concern for pulmonary embolism. EXAM: CT ANGIOGRAPHY CHEST WITH CONTRAST TECHNIQUE: Multidetector CT imaging of the chest was performed using the standard protocol during bolus administration of  intravenous contrast. Multiplanar CT image reconstructions and MIPs were obtained to evaluate the vascular anatomy. CONTRAST:  10mL OMNIPAQUE IOHEXOL 350 MG/ML SOLN COMPARISON:  Chest CT dated 05/14/2020. FINDINGS: Cardiovascular: Mild cardiomegaly. No pericardial effusion. There is coronary vascular calcification. Mild atherosclerotic calcification of the thoracic aorta. Linear nonocclusive thrombus noted extending from the central right pulmonary artery into the lobar and segmental branches of the right lower lobe and right upper lobe. This is new since the prior CT. No CT evidence of right heart straining. Mediastinum/Nodes: Top-normal right hilar lymph nodes. The esophagus and the thyroid gland are grossly unremarkable. No mediastinal fluid collection. Lungs/Pleura: Bilateral patchy and streaky densities similar to prior CT. Calcified pleural plaque noted in the left upper lobe anteriorly. No pleural effusion pneumothorax. The central airways are patent. Upper Abdomen: No acute abnormality. Musculoskeletal: No chest wall abnormality. No acute or significant osseous findings. Review of the MIP images confirms the above findings. IMPRESSION: 1. Pulmonary artery embolus extending from the central right pulmonary artery into the lobar and segmental branches of the right lower lobe and right upper lobe, new since the prior CT. No CT evidence of right heart straining. 2. Bilateral patchy and streaky densities similar to prior CT. 3. Aortic Atherosclerosis (ICD10-I70.0). These results were called by telephone at the time of interpretation on 05/25/2020 at 6:39 pm to provider DAVID YAO , who verbally acknowledged these results. Electronically Signed   By: Anner Crete M.D.   On: 05/25/2020 18:43   CT ANGIO CHEST PE W OR WO CONTRAST  Result Date: 05/14/2020 CLINICAL  DATA:  High probability sepsis due to pneumonia EXAM: CT ANGIOGRAPHY CHEST WITH CONTRAST TECHNIQUE: Multidetector CT imaging of the chest was performed  using the standard protocol during bolus administration of intravenous contrast. Multiplanar CT image reconstructions and MIPs were obtained to evaluate the vascular anatomy. CONTRAST:  73mL OMNIPAQUE IOHEXOL 350 MG/ML SOLN COMPARISON:  04/02/2020 FINDINGS: Cardiovascular: Satisfactory opacification by motion of pulmonary artifact. Arteries although limited there is a convincing branching pulmonary artery filling defect affecting segmental vessels in the basal right lower lobe as marked on series 6. Additional peripheral emboli could be present and obscured by the degree of motion artifact. No central embolism is seen. No evidence of right heart strain. Heart size is normal. No pericardial effusion. Atherosclerosis of the coronaries. Mediastinum/Nodes: Negative for adenopathy or mass. Lungs/Pleura: Chronic lung disease with reticulation. Bilateral pleural plaque appearance without calcification. There is increased ground-glass density scattered in the bilateral lungs, greatest in the right upper lobe. No interlobular septal thickening or pleural fluid to implicate failure. Mild centrilobular emphysema. Upper Abdomen: Negative Musculoskeletal: No unexpected finding Review of the MIP images confirms the above findings. Critical Value/emergent results were called by telephone at the time of interpretation on 05/14/2020 at 8:35 am to provider Elgergawy, who verbally acknowledged these results. IMPRESSION: 1. Positive for segmental pulmonary embolism in the right lower lobe. Additional pulmonary emboli could be obscured by the degree of motion artifact. 2. Background of chronic opacities related to recent COVID infection. There is also new ground-glass opacity especially in the right upper lobe suggesting a superimposed acute pneumonia. 3. Aortic Atherosclerosis (ICD10-I70.0).  Coronary atherosclerosis. Electronically Signed   By: Monte Fantasia M.D.   On: 05/14/2020 08:35   MR BRAIN W WO CONTRAST  Result Date:  05/16/2020 CLINICAL DATA:  Delirium; fever with unknown origin, altered mental status, evaluate for encephalitis. EXAM: MRI HEAD WITHOUT AND WITH CONTRAST TECHNIQUE: Multiplanar, multiecho pulse sequences of the brain and surrounding structures were obtained without and with intravenous contrast. CONTRAST:  109mL GADAVIST GADOBUTROL 1 MMOL/ML IV SOLN COMPARISON:  Prior head CT examinations 04/02/2020 and earlier. Brain MRI 12/13/2017. FINDINGS: Brain: Mild cerebral and cerebellar atrophy. AP punctate focus of restricted diffusion is questioned within the right parietal lobe (versus artifact) (series 3, image 33). Additionally, there is apparent subtle restricted diffusion along the midline callosal splenium, slightly eccentric to the left. A few small foci of T2/FLAIR hyperintensity within the bilateral cerebral white matter and right thalamus are nonspecific, but likely reflect minimal chronic small vessel ischemic disease. Redemonstrated left retrocerebellar arachnoid cyst. There is no acute infarct. No evidence of intracranial mass. No chronic intracranial blood products. No extra-axial fluid collection. No midline shift. No abnormal intracranial enhancement. Vascular: Expected proximal arterial flow voids. Skull and upper cervical spine: No focal marrow lesion. Incompletely assessed cervical spondylosis. Suspected C4-C5 vertebral body ankylosis. Sinuses/Orbits: Visualized orbits show no acute finding. Bilateral lens replacements. Mild paranasal sinus mucosal thickening, most notably within the bilateral ethmoid air cells. Small left maxillary sinus mucous retention cyst. Other: Right mastoid effusion. IMPRESSION: Punctate acute infarct versus artifact within the right parietal lobe. Additionally, there is apparent subtle restricted diffusion within the midline callosal splenium, slightly eccentric to the left. This may reflect a small focus of acute ischemia or artifact. Alternatively, this may reflect a cytotoxic  lesion of the corpus callosum (which has a broad range of potential etiologies and clinical associations), although the appearance would be atypical for this entity. Background mild parenchymal atrophy and chronic small vessel ischemic  disease. Mild paranasal sinus disease as described. Right mastoid effusion. Electronically Signed   By: Kellie Simmering DO   On: 05/16/2020 18:51   MR CERVICAL SPINE W WO CONTRAST  Result Date: 05/17/2020 CLINICAL DATA:  Initial screening for possible epidural abscess. EXAM: MRI CERVICAL SPINE WITHOUT AND WITH CONTRAST TECHNIQUE: Multiplanar and multiecho pulse sequences of the cervical spine, to include the craniocervical junction and cervicothoracic junction, were obtained without and with intravenous contrast. CONTRAST:  67mL GADAVIST GADOBUTROL 1 MMOL/ML IV SOLN COMPARISON:  Prior MRI from 03/24/2010. FINDINGS: Alignment: Reversal of the normal cervical lordosis with apex at C3-4. Trace anterolisthesis of C3 on C4 and C7 on T1, chronic and facet mediated. Vertebrae: Vertebral body height maintained without acute or chronic fracture. C4 and C5 vertebral bodies are largely ankylosed, likely degenerative. Bone marrow signal intensity within normal limits. No discrete or worrisome osseous lesions. No abnormal marrow edema or enhancement. No findings to suggest osteomyelitis discitis or septic arthritis. Cord: Normal signal and morphology. No epidural abscess or other collection. Posterior Fossa, vertebral arteries, paraspinal tissues: Probable benign retro cerebellar arachnoid cyst noted. Visualized brain and posterior fossa otherwise unremarkable. Craniocervical junction within normal limits. Paraspinous and prevertebral soft tissues normal. Normal flow voids seen within the vertebral arteries bilaterally. Disc levels: C2-C3: Negative interspace. Right worse than left facet hypertrophy. No spinal stenosis. Mild right C3 foraminal narrowing. Left neural foramina remains patent. C3-C4:  Broad-based posterior disc osteophyte flattens and effaces the ventral thecal sac. Mild flattening of the ventral cord without cord signal changes. Mild spinal stenosis. Superimposed bilateral facet hypertrophy with left greater than right uncovertebral spurring. Severe left with moderate right C4 foraminal stenosis. C4-C5: C4 and C5 vertebral bodies are largely ankylosed. Broad posterior endplate osseous ridging flattens and effaces the ventral thecal sac. Mild spinal stenosis without cord impingement. Moderate right worse than left C5 foraminal stenosis. C5-C6: Degenerative intervertebral disc space narrowing with diffuse disc osteophyte complex. Mild flattening of the ventral thecal sac without significant spinal stenosis. Moderate left worse than right C6 foraminal narrowing. C6-C7: Degenerative intervertebral disc space narrowing with diffuse disc osteophyte complex. No significant spinal stenosis. Severe left with moderate right C7 foraminal stenosis. C7-T1: Trace anterolisthesis. No significant disc bulge. Moderate bilateral facet and ligament flavum hypertrophy. No significant spinal stenosis. Foramina remain patent. IMPRESSION: 1. No evidence for osteomyelitis discitis or septic arthritis within the cervical spine. No epidural abscess or other infection. 2. Multilevel cervical spondylosis with resultant mild spinal stenosis at C3-4 and C4-5. 3. Multifactorial degenerative changes with resultant multilevel foraminal narrowing as above. Notable findings include severe left with moderate right C4 foraminal stenosis, moderate bilateral C5 and C6 foraminal narrowing, with severe left and moderate right C7 foraminal stenosis. Electronically Signed   By: Jeannine Boga M.D.   On: 05/17/2020 21:40   MR THORACIC SPINE W WO CONTRAST  Result Date: 05/17/2020 CLINICAL DATA:  Initial screening for possible infection, epidural abscess. EXAM: MRI THORACIC WITHOUT AND WITH CONTRAST TECHNIQUE: Multiplanar and  multiecho pulse sequences of the thoracic spine were obtained without and with intravenous contrast. CONTRAST:  44mL GADAVIST GADOBUTROL 1 MMOL/ML IV SOLN COMPARISON:  None available. FINDINGS: Alignment: Straightening of the normal thoracic kyphosis. No listhesis. Vertebrae: Vertebral body height maintained without acute or chronic fracture. Bone marrow signal intensity within normal limits. Few scattered benign hemangiomata noted. No worrisome osseous lesions. No abnormal marrow edema or enhancement. No findings to suggest osteomyelitis discitis or septic arthritis. Cord: Normal signal and morphology. No epidural abscess  or other collection. No abnormal enhancement. Paraspinal and other soft tissues: Paraspinous soft tissues demonstrate no acute finding. Small layering bilateral pleural effusions noted, left slightly larger than right. T2 hyperintense benign appearing cyst partially visualized extending from the right kidney. Visualized visceral structures otherwise grossly unremarkable. Disc levels: T1-2:  Unremarkable. T2-3: Tiny right paracentral disc extrusion with superior migration. Mild right-sided facet hypertrophy. No spinal stenosis. Foramina remain patent. T3-4: Minimal disc bulge. No spinal stenosis. Foramina remain patent. T4-5: Normal interspace. Mild right-sided facet hypertrophy. No stenosis. T5-6:  Normal interspace.  Mild facet hypertrophy.  No stenosis. T6-7:  Unremarkable. T7-8: Mild degenerative intervertebral disc space narrowing with disc bulge. No spinal stenosis. Foramina remain patent. T8-9: Mild disc bulge. No spinal stenosis. Foramina remain patent. T9-10: Mild disc bulge with posterior element hypertrophy. No significant spinal stenosis. Mild bilateral foraminal narrowing. T10-11: Diffuse disc bulge, asymmetric to the right. Moderate facet and ligament flavum hypertrophy. Resultant mild to moderate spinal stenosis with mild cord flattening. No cord signal changes. Moderate bilateral  foraminal narrowing. T11-12: Negative interspace. Bilateral facet hypertrophy. No significant spinal stenosis. Mild bilateral foraminal narrowing. T12-L1: Disc desiccation with small endplate Schmorl's node deformities. No stenosis. IMPRESSION: 1. No MRI evidence for osteomyelitis discitis or septic arthritis within the thoracic spine. No epidural abscess or other infection. 2. Disc bulging with facet hypertrophy at T10-11 with resultant mild to moderate spinal stenosis and moderate bilateral foraminal narrowing. 3. Additional mild spondylosis elsewhere within the thoracic spine without significant stenosis. 4. Small layering bilateral pleural effusions, left slightly larger than right. Electronically Signed   By: Jeannine Boga M.D.   On: 05/17/2020 21:52   MR Lumbar Spine W Wo Contrast  Result Date: 05/13/2020 CLINICAL DATA:  Fever, lumbar spine tenderness EXAM: MRI LUMBAR SPINE WITHOUT AND WITH CONTRAST TECHNIQUE: Multiplanar and multiecho pulse sequences of the lumbar spine were obtained without and with intravenous contrast. CONTRAST:  40mL GADAVIST GADOBUTROL 1 MMOL/ML IV SOLN COMPARISON:  04/03/2018 FINDINGS: Segmentation:  Standard. Alignment:  Stable. Vertebrae: There is now fusion across the L1-L2 disc space. There is no new marrow edema or enhancement. Multilevel degenerative plate irregularity and Schmorl's nodes. Conus medullaris and cauda equina: Conus extends to the L1-L2 level. Conus and cauda equina appear normal. No intrathecal enhancement. Paraspinal and other soft tissues: Mild lower lumbar paraspinal edema. No evidence of collection. Disc levels: L1-L2: Prior right laminectomy. Stable retrolisthesis with bridging endplate osteophytes effacing the ventral thecal sac. Similar mild to moderate foraminal stenosis. L2-L3: Disc bulge. Facet arthropathy with ligamentum flavum infolding. Slightly increased mild canal stenosis. Similar mild foraminal stenosis. L3-L4: Disc bulge. Facet arthropathy  with ligamentum flavum infolding. Similar moderate canal stenosis with effacement of subarticular recesses. Similar mild to moderate left greater than right foraminal stenosis. L4-L5: Disc bulge with superimposed left subarticular/foraminal protrusion. Facet arthropathy with ligamentum flavum infolding. Similar mild canal stenosis. Similar partial effacement of the left subarticular recess. Similar mild foraminal stenosis. L5-S1: Disc bulge with endplate osteophytic ridging. Facet arthropathy. No canal stenosis. Similar mild foraminal stenosis. IMPRESSION: No evidence of discitis/osteomyelitis. Multilevel degenerative changes as detailed above without substantial change since the prior examination. Electronically Signed   By: Macy Mis M.D.   On: 05/13/2020 14:25   CT ABDOMEN PELVIS W CONTRAST  Result Date: 05/15/2020 CLINICAL DATA:  Sepsis.  Fever of unknown origin. EXAM: CT ABDOMEN AND PELVIS WITH CONTRAST TECHNIQUE: Multidetector CT imaging of the abdomen and pelvis was performed using the standard protocol following bolus administration of intravenous contrast.  CONTRAST:  149mL OMNIPAQUE IOHEXOL 300 MG/ML  SOLN COMPARISON:  04/02/2020.  Chest CTA obtained earlier today. FINDINGS: Lower chest: Progressive pulmonary embolism on the right, with emboli currently demonstrated in the distal right main pulmonary artery and significantly more clot in the intersegmental and proximal right lower lobe pulmonary arteries. Bilateral lung densities and atelectasis with improvement since this morning. Hepatobiliary: Diffuse low density of the liver relative to the spleen. Normal appearing gallbladder. Pancreas: Unremarkable. No pancreatic ductal dilatation or surrounding inflammatory changes. Spleen: Normal in size without focal abnormality. Adrenals/Urinary Tract: Normal appearing adrenal glands. Bilateral renal cysts. Multiple small bilateral renal calculi. No bladder or ureteral calculi are seen. Excreted contrast  in the urinary bladder from the CTA earlier today. Stomach/Bowel: Multiple colonic diverticula without evidence of diverticulitis. Unremarkable stomach, small bowel and appendix. Vascular/Lymphatic: Atheromatous arterial calcifications without aneurysm. No enlarged lymph nodes. Two renal arteries on the left. Reproductive: Mildly enlarged prostate gland. Other: Small bilateral inguinal hernias containing fat and very small umbilical artery containing fat. Musculoskeletal: Stable lumbar and lower thoracic spine degenerative changes and fusion of the L1 and L2 vertebral bodies. IMPRESSION: 1. Progressive pulmonary embolism on the right, with emboli currently demonstrated in the distal right main pulmonary artery and significantly more clot in the intersegmental and proximal right lower lobe pulmonary arteries. These results will be called to the ordering clinician or representative by the Radiologist Assistant, and communication documented in the PACS or [employer]. 2. Lung densities and atelectasis with improvement since this morning. 3. Diffuse hepatic steatosis. 4. Multiple small, nonobstructing bilateral renal calculi. 5. Colonic diverticulosis. Electronically Signed   By: Claudie Revering M.D.   On: 05/15/2020 22:17   VAS [place] IVC/ILIAC (VENOUS ONLY)  Result Date: 05/11/2020 IVC/ILIAC STUDY Indications: DVT, recent COVID Limitations: Air/bowel gas.  Comparison Study: No prior study Performing Technologist: Maudry Mayhew MHA, RDMS, RVT, RDCS  Examination Guidelines: A complete evaluation includes B-mode imaging, spectral Doppler, color Doppler, and power Doppler as needed of all accessible portions of each vessel. Bilateral testing is considered an integral part of a complete examination. Limited examinations for reoccurring indications may be performed as noted.  IVC/Iliac Findings: +----------+------+--------+--------+    IVC    PatentThrombusComments +----------+------+--------+--------+ IVC Mid    patent                 +----------+------+--------+--------+ IVC Distalpatent                 +----------+------+--------+--------+  +-------------------+---------+-----------+---------+-----------+--------+         CIV        RT-PatentRT-ThrombusLT-PatentLT-ThrombusComments +-------------------+---------+-----------+---------+-----------+--------+ Common Iliac Prox                       patent                      +-------------------+---------+-----------+---------+-----------+--------+ Common Iliac Mid                        patent                      +-------------------+---------+-----------+---------+-----------+--------+ Common Iliac Distal patent              patent                      +-------------------+---------+-----------+---------+-----------+--------+  +-------------------------+---------+-----------+---------+-----------+--------+            EIV  RT-PatentRT-ThrombusLT-PatentLT-ThrombusComments +-------------------------+---------+-----------+---------+-----------+--------+ External Iliac Vein Prox                      patent                      +-------------------------+---------+-----------+---------+-----------+--------+ External Iliac Vein       patent                                          Distal                                                                    +-------------------------+---------+-----------+---------+-----------+--------+   Summary: IVC/Iliac: No evidence of thrombus in IVC and Iliac veins.  *See table(s) above for measurements and observations.  Electronically signed by Harold Barban MD on 05/11/2020 at 9:55:50 PM.    Final    DG Chest Port 1 View  Result Date: 05/28/2020 CLINICAL DATA:  Shortness of breath EXAM: PORTABLE CHEST 1 VIEW COMPARISON:  Three days ago FINDINGS: Patchy bilateral pulmonary infiltrate without convincing progression. Lung volumes are low. No visible effusion or air  leak. Normal heart size and stable mediastinal contours. IMPRESSION: Similar degree of bilateral pulmonary infiltrates. No new abnormality. Electronically Signed   By: Monte Fantasia M.D.   On: 05/28/2020 07:38   DG Chest Port 1 View  Result Date: 05/25/2020 CLINICAL DATA:  75 year old male with concern for sepsis. EXAM: PORTABLE CHEST 1 VIEW COMPARISON:  Chest CT dated 05/14/2020. FINDINGS: Diffuse bilateral streaky interstitial densities worsened since 05/09/2020 but relatively similar to 05/10/2020 concerning for residual infiltrate, likely on the background of chronic changes. Clinical correlation and follow-up recommended. No lobar consolidation, pleural effusion, pneumothorax. The cardiac silhouette is within limits. No acute osseous pathology. IMPRESSION: Persistent bilateral infiltrates. Electronically Signed   By: Anner Crete M.D.   On: 05/25/2020 16:24   DG Chest Port 1 View  Result Date: 05/10/2020 CLINICAL DATA:  Short of breath, history of COPD EXAM: PORTABLE CHEST 1 VIEW COMPARISON:  Prior chest x-ray dated yesterday FINDINGS: Stable cardiac and mediastinal contours. Marked interval increase in diffuse interstitial prominence bilaterally now with Kerley B-lines in the periphery. Findings are most consistent with worsening CHF. No large effusion or pneumothorax. No acute osseous abnormality. IMPRESSION: Increasing interstitial pulmonary edema concerning for progressive CHF. Electronically Signed   By: Jacqulynn Cadet M.D.   On: 05/10/2020 07:54   DG Chest Port 1 View  Result Date: 05/09/2020 CLINICAL DATA:  Possible sepsis Fever and cough EXAM: PORTABLE CHEST 1 VIEW COMPARISON:  04/04/2020 FINDINGS: Heart size within normal limits. No pulmonary vascular congestion. Interval improvement in aeration of the lungs with bilateral airspace opacities still remaining. IMPRESSION: Interval improvement in aeration of the lungs with bilateral opacities still remaining consistent with resolving  pneumonia. Electronically Signed   By: Miachel Roux M.D.   On: 05/09/2020 12:56   ECHOCARDIOGRAM COMPLETE  Result Date: 05/26/2020    ECHOCARDIOGRAM REPORT   Patient Name:   DHANI FAHRER Date of Exam: 05/26/2020 Medical Rec #:  [id number]      Height:       74.0 in Accession #:  [id number]     Weight:       194.0 lb Date of Birth:  [birth year]      BSA:          2.146 m Patient Age:    54 years       BP:           129/92 mmHg Patient Gender: M              HR:           84 bpm. Exam Location:  Inpatient Procedure: 2D Echo, Cardiac Doppler, Color Doppler and Intracardiac            Opacification Agent Indications:    Pulmonary Embolus I26.09  History:        Patient has prior history of Echocardiogram examinations, most                 recent 04/03/2020. CAD and Previous Myocardial Infarction, COPD,                 Signs/Symptoms:Shortness of Breath; Risk Factors:Hypertension                 and Dyslipidemia. COVID 19. Sepsis, due to unspecified organism,                 unspecified whether acute organ dysfunction present                 Multiple subsegmental pulmonary emboli without acute cor                 pulmonale.  Sonographer:    Darlina Sicilian RDCS Referring Phys: [id number] [place]  1. Left ventricular ejection fraction, by estimation, is 50 to 55%. The left ventricle has low normal function. The left ventricle has no regional wall motion abnormalities. There is severe asymmetric left ventricular hypertrophy of the basal and septal  segments. Left ventricular diastolic parameters are consistent with Grade II diastolic dysfunction (pseudonormalization). Elevated left ventricular end-diastolic pressure.  2. Right ventricular systolic function is normal. The right ventricular size is normal.  3. The mitral valve is abnormal. No evidence of mitral valve regurgitation. No evidence of mitral stenosis.  4. The aortic valve is tricuspid. There is mild calcification of the aortic valve. Aortic  valve regurgitation is not visualized. Mild aortic valve sclerosis is present, with no evidence of aortic valve stenosis.  5. Aortic dilatation noted. There is mild dilatation of the aortic root, measuring 41 mm.  6. The inferior vena cava is normal in size with greater than 50% respiratory variability, suggesting right atrial pressure of 3 mmHg. FINDINGS  Left Ventricle: Left ventricular ejection fraction, by estimation, is 50 to 55%. The left ventricle has low normal function. The left ventricle has no regional wall motion abnormalities. Definity contrast agent was given IV to delineate the left ventricular endocardial borders. The left ventricular internal cavity size was normal in size. There is severe asymmetric left ventricular hypertrophy of the basal and septal segments. Left ventricular diastolic parameters are consistent with Grade II diastolic dysfunction (pseudonormalization). Elevated left ventricular end-diastolic pressure. Right Ventricle: The right ventricular size is normal. No increase in right ventricular wall thickness. Right ventricular systolic function is normal. Left Atrium: Left atrial size was normal in size. Right Atrium: Right atrial size was normal in size. Pericardium: There is no evidence of pericardial effusion. Mitral Valve: The mitral valve is abnormal. There is mild thickening of the mitral valve leaflet(s). There is  mild calcification of the mitral valve leaflet(s). Mild mitral annular calcification. No evidence of mitral valve regurgitation. No evidence of mitral valve stenosis. Tricuspid Valve: The tricuspid valve is normal in structure. Tricuspid valve regurgitation is not demonstrated. No evidence of tricuspid stenosis. Aortic Valve: The aortic valve is tricuspid. There is mild calcification of the aortic valve. Aortic valve regurgitation is not visualized. Mild aortic valve sclerosis is present, with no evidence of aortic valve stenosis. Pulmonic Valve: The pulmonic valve was  normal in structure. Pulmonic valve regurgitation is not visualized. No evidence of pulmonic stenosis. Aorta: The aortic root is normal in size and structure and aortic dilatation noted. There is mild dilatation of the aortic root, measuring 41 mm. Venous: The inferior vena cava is normal in size with greater than 50% respiratory variability, suggesting right atrial pressure of 3 mmHg. IAS/Shunts: No atrial level shunt detected by color flow Doppler.  LEFT VENTRICLE PLAX 2D LVIDd:         3.20 cm     Diastology LVIDs:         2.60 cm     LV e' medial:    3.15 cm/s LV PW:         1.40 cm     LV E/e' medial:  17.1 LV IVS:        2.00 cm     LV e' lateral:   3.37 cm/s LVOT diam:     2.00 cm     LV E/e' lateral: 16.0 LV SV:         41 LV SV Index:   19 LVOT Area:     3.14 cm  LV Volumes (MOD) LV vol d, MOD A2C: 91.2 ml LV vol d, MOD A4C: 78.4 ml LV vol s, MOD A2C: 43.8 ml LV vol s, MOD A4C: 36.8 ml LV SV MOD A2C:     47.4 ml LV SV MOD A4C:     78.4 ml LV SV MOD BP:      44.4 ml RIGHT VENTRICLE RV S prime:     8.16 cm/s LEFT ATRIUM             Index       RIGHT ATRIUM          Index LA diam:        3.00 cm 1.40 cm/m  RA Area:     6.38 cm LA Vol (A2C):   20.1 ml 9.37 ml/m  RA Volume:   7.69 ml  3.58 ml/m LA Vol (A4C):   21.3 ml 9.93 ml/m LA Biplane Vol: 21.6 ml 10.07 ml/m  AORTIC VALVE LVOT Vmax:   80.50 cm/s LVOT Vmean:  60.800 cm/s LVOT VTI:    0.130 m  AORTA Ao Root diam: 4.10 cm Ao Asc diam:  3.40 cm MITRAL VALVE MV Area (PHT): 3.01 cm    SHUNTS MV Decel Time: 252 msec    Systemic VTI:  0.13 m MV E velocity: 53.80 cm/s  Systemic Diam: 2.00 cm MV A velocity: 81.50 cm/s MV E/A ratio:  0.66 Jenkins Rouge MD Electronically signed by Jenkins Rouge MD Signature Date/Time: 05/26/2020/2:30:51 PM    Final    VAS [place] LOWER EXTREMITY VENOUS (DVT)  Result Date: 05/26/2020  Lower Venous DVT Study Indications: Edema, and Swelling.  Comparison Study: 05/11/20 previous Performing Technologist: Abram Sander RVS  Examination  Guidelines: A complete evaluation includes B-mode imaging, spectral Doppler, color Doppler, and power Doppler as needed of all accessible portions of each vessel.  Bilateral testing is considered an integral part of a complete examination. Limited examinations for reoccurring indications may be performed as noted. The reflux portion of the exam is performed with the patient in reverse Trendelenburg.  +---------+---------------+---------+-----------+----------+--------------+ RIGHT    CompressibilityPhasicitySpontaneityPropertiesThrombus Aging +---------+---------------+---------+-----------+----------+--------------+ CFV      Full           Yes      Yes                                 +---------+---------------+---------+-----------+----------+--------------+ SFJ      Full                                                        +---------+---------------+---------+-----------+----------+--------------+ FV Prox  Full                                                        +---------+---------------+---------+-----------+----------+--------------+ FV Mid   Full                                                        +---------+---------------+---------+-----------+----------+--------------+ FV DistalFull                                                        +---------+---------------+---------+-----------+----------+--------------+ PFV      Full                                                        +---------+---------------+---------+-----------+----------+--------------+ POP      Full           Yes      Yes                                 +---------+---------------+---------+-----------+----------+--------------+ PTV      Full                                                        +---------+---------------+---------+-----------+----------+--------------+ PERO     Full                                                         +---------+---------------+---------+-----------+----------+--------------+   +---------+---------------+---------+-----------+----------+-----------------+ LEFT     CompressibilityPhasicitySpontaneityPropertiesThrombus Aging    +---------+---------------+---------+-----------+----------+-----------------+  CFV      Full           Yes      Yes                                    +---------+---------------+---------+-----------+----------+-----------------+ SFJ      Full                                                           +---------+---------------+---------+-----------+----------+-----------------+ FV Prox  Full                                                           +---------+---------------+---------+-----------+----------+-----------------+ FV Mid   Full                                                           +---------+---------------+---------+-----------+----------+-----------------+ FV DistalFull                                                           +---------+---------------+---------+-----------+----------+-----------------+ PFV      Full                                                           +---------+---------------+---------+-----------+----------+-----------------+ POP      None           No       No                   Age Indeterminate +---------+---------------+---------+-----------+----------+-----------------+ PTV      None                                         Age Indeterminate +---------+---------------+---------+-----------+----------+-----------------+ PERO     None                                         Age Indeterminate +---------+---------------+---------+-----------+----------+-----------------+     Summary: RIGHT: - There is no evidence of deep vein thrombosis in the lower extremity.  - No cystic structure found in the popliteal fossa.  LEFT: - Findings consistent with age indeterminate deep vein  thrombosis involving the left popliteal vein, left posterior tibial veins, and left peroneal veins. - No cystic structure found in the popliteal fossa.  *See table(s) above for measurements and observations. Electronically signed by Durene Fruits  Brabham MD on 05/26/2020 at 9:28:41 PM.    Final    VAS [place] LOWER EXTREMITY VENOUS (DVT)  Result Date: 05/11/2020  Lower Venous DVT Study Indications: Edema, Recent Covid infection, and SOB.  Comparison Study: 03/31/20 negative bilateral lower extremity venous duplex Performing Technologist: Maudry Mayhew MHA, RDMS, RVT, RDCS  Examination Guidelines: A complete evaluation includes B-mode imaging, spectral Doppler, color Doppler, and power Doppler as needed of all accessible portions of each vessel. Bilateral testing is considered an integral part of a complete examination. Limited examinations for reoccurring indications may be performed as noted. The reflux portion of the exam is performed with the patient in reverse Trendelenburg.  +---------+---------------+---------+-----------+----------+--------------+ RIGHT    CompressibilityPhasicitySpontaneityPropertiesThrombus Aging +---------+---------------+---------+-----------+----------+--------------+ CFV      Full           Yes      Yes                                 +---------+---------------+---------+-----------+----------+--------------+ SFJ      Full                                                        +---------+---------------+---------+-----------+----------+--------------+ FV Prox  Full                                                        +---------+---------------+---------+-----------+----------+--------------+ FV Mid   Full                                                        +---------+---------------+---------+-----------+----------+--------------+ FV DistalFull                                                         +---------+---------------+---------+-----------+----------+--------------+ PFV      Full                                                        +---------+---------------+---------+-----------+----------+--------------+ POP      Full           Yes      Yes                                 +---------+---------------+---------+-----------+----------+--------------+ PTV      Full                                                        +---------+---------------+---------+-----------+----------+--------------+  PERO     Full                                                        +---------+---------------+---------+-----------+----------+--------------+ Gastroc  None                    No                   Acute          +---------+---------------+---------+-----------+----------+--------------+   +---------+---------------+---------+-----------+----------+--------------+ LEFT     CompressibilityPhasicitySpontaneityPropertiesThrombus Aging +---------+---------------+---------+-----------+----------+--------------+ CFV      Full           Yes      Yes                                 +---------+---------------+---------+-----------+----------+--------------+ SFJ      Full                                                        +---------+---------------+---------+-----------+----------+--------------+ FV Prox  None                    No                   Acute          +---------+---------------+---------+-----------+----------+--------------+ FV Mid   None                    No                   Acute          +---------+---------------+---------+-----------+----------+--------------+ FV DistalNone                    No                   Acute          +---------+---------------+---------+-----------+----------+--------------+ PFV      Full                                                         +---------+---------------+---------+-----------+----------+--------------+ POP      None                    No                   Acute          +---------+---------------+---------+-----------+----------+--------------+ PTV      None                    No                   Acute          +---------+---------------+---------+-----------+----------+--------------+ PERO     None  No                   Acute          +---------+---------------+---------+-----------+----------+--------------+     Summary: RIGHT: - Findings consistent with acute deep vein thrombosis involving the right gastrocnemius veins. - No cystic structure found in the popliteal fossa.  LEFT: - Findings consistent with acute deep vein thrombosis involving the left femoral vein, left popliteal vein, left posterior tibial veins, and left peroneal veins. - No cystic structure found in the popliteal fossa.  *See table(s) above for measurements and observations. Electronically signed by Harold Barban MD on 05/11/2020 at 9:56:21 PM.    Final

## 2020-05-29 ENCOUNTER — Inpatient Hospital Stay (HOSPITAL_COMMUNITY): Payer: Medicare Other

## 2020-05-29 LAB — MAGNESIUM: Magnesium: 1.8 mg/dL (ref 1.7–2.4)

## 2020-05-29 LAB — CBC
HCT: 33.7 % — ABNORMAL LOW (ref 39.0–52.0)
Hemoglobin: 10.8 g/dL — ABNORMAL LOW (ref 13.0–17.0)
MCH: 28.3 pg (ref 26.0–34.0)
MCHC: 32 g/dL (ref 30.0–36.0)
MCV: 88.5 fL (ref 80.0–100.0)
Platelets: 113 10*3/uL — ABNORMAL LOW (ref 150–400)
RBC: 3.81 MIL/uL — ABNORMAL LOW (ref 4.22–5.81)
RDW: 19.6 % — ABNORMAL HIGH (ref 11.5–15.5)
WBC: 12.8 10*3/uL — ABNORMAL HIGH (ref 4.0–10.5)
nRBC: 0 % (ref 0.0–0.2)

## 2020-05-29 LAB — HEPARIN LEVEL (UNFRACTIONATED): Heparin Unfractionated: 0.69 IU/mL (ref 0.30–0.70)

## 2020-05-29 LAB — GLUCOSE, CAPILLARY
Glucose-Capillary: 281 mg/dL — ABNORMAL HIGH (ref 70–99)
Glucose-Capillary: 405 mg/dL — ABNORMAL HIGH (ref 70–99)
Glucose-Capillary: 439 mg/dL — ABNORMAL HIGH (ref 70–99)
Glucose-Capillary: 421 mg/dL — ABNORMAL HIGH (ref 70–99)

## 2020-05-29 LAB — COMPREHENSIVE METABOLIC PANEL
ALT: 28 U/L (ref 0–44)
AST: 24 U/L (ref 15–41)
Albumin: 2.2 g/dL — ABNORMAL LOW (ref 3.5–5.0)
Alkaline Phosphatase: 71 U/L (ref 38–126)
Anion gap: 7 (ref 5–15)
BUN: 30 mg/dL — ABNORMAL HIGH (ref 8–23)
CO2: 27 mmol/L (ref 22–32)
Calcium: 8.9 mg/dL (ref 8.9–10.3)
Chloride: 99 mmol/L (ref 98–111)
Creatinine, Ser: 0.76 mg/dL (ref 0.61–1.24)
GFR, Estimated: >60 mL/min (ref >60)
Glucose, Bld: 265 mg/dL — ABNORMAL HIGH (ref 70–99)
Potassium: 4.4 mmol/L (ref 3.5–5.1)
Sodium: 133 mmol/L — ABNORMAL LOW (ref 135–145)
Total Bilirubin: 0.5 mg/dL (ref 0.3–1.2)
Total Protein: 4.8 g/dL — ABNORMAL LOW (ref 6.5–8.1)

## 2020-05-29 LAB — APTT: aPTT: 73 seconds — ABNORMAL HIGH (ref 24–36)

## 2020-05-29 LAB — URINE CULTURE: Culture: 20000 — AB

## 2020-05-29 LAB — BRAIN NATRIURETIC PEPTIDE: B Natriuretic Peptide: 503.2 pg/mL — ABNORMAL HIGH (ref 0.0–100.0)

## 2020-05-29 LAB — C-REACTIVE PROTEIN: CRP: 1.5 mg/dL — ABNORMAL HIGH (ref <1.0)

## 2020-05-29 MED ORDER — FUROSEMIDE 10 MG/ML IJ SOLN
20.0000 mg | Freq: Once | INTRAMUSCULAR | Status: AC
  Administered 2020-05-29: 20 mg via INTRAVENOUS
  Filled 2020-05-29: qty 2

## 2020-05-29 MED ORDER — INSULIN GLARGINE 100 UNIT/ML ~~LOC~~ SOLN
20.0000 [IU] | Freq: Once | SUBCUTANEOUS | Status: AC
  Administered 2020-05-29: 20 [IU] via SUBCUTANEOUS
  Filled 2020-05-29 (×2): qty 0.2

## 2020-05-29 MED ORDER — INSULIN GLARGINE 100 UNIT/ML ~~LOC~~ SOLN
40.0000 [IU] | Freq: Every day | SUBCUTANEOUS | Status: DC
  Administered 2020-05-30 – 2020-05-31 (×2): 40 [IU] via SUBCUTANEOUS
  Filled 2020-05-29 (×2): qty 0.4

## 2020-05-29 NOTE — Progress Notes (Signed)
Mobility Specialist: Progress Note   05/29/20 1258  Mobility  Activity Transferred:  Bed to chair  Level of Assistance +2 (takes two people)  Assistive Device None  Mobility Response Tolerated fair  Mobility performed by Mobility specialist  Bed Position Chair  $Mobility charge 1 Mobility   During Mobility: 83-85% SpO2 Post-Mobility: 89-90% SpO2  Transferred pt to chair with assistance from NT for safety. Pt was modA to stand and c/o of feeling dizzy upon standing. Pt able to transfer to chair with assistance. Family member present in room.   [facility] Aaran Enberg Mobility Specialist Mobility Specialist Phone: [phone]

## 2020-05-29 NOTE — Plan of Care (Signed)

## 2020-05-29 NOTE — Progress Notes (Signed)
[place] for heparin Indication: pulmonary embolus  Allergies  Allergen Reactions  . Cephalexin Shortness Of Breath, Rash and Other (See Comments)    Tolerated Augmentin 2015 and 2017 (12-2017). Tolerated nafcillin 12/2017 PATIENT HAS HAD A PCN REACTION WITH IMMEDIATE RASH, FACIAL/TONGUE/THROAT SWELLING, SOB, OR LIGHTHEADEDNESS WITH HYPOTENSION:  #  #  YES  #  #  Has patient had a PCN reaction causing severe rash involving mucus membranes or skin necrosis: No Has patient had a PCN reaction that required hospitalization: No Has patient had a PCN reaction occurring within the last 10 years: No If all of the above answers are "NO", then may proceed  . Clarithromycin Shortness Of Breath and Rash  . Certolizumab Pegol Hives  . Tocilizumab Hives  . Doxycycline Rash  . Hydroxyzine Rash  . Lidoderm Other (See Comments)    "made me act weird"  . Pyrithione Zinc Rash    Patient Measurements: Height: 6\' 2"  (188 cm) Weight: 88 kg (194 lb 0.1 oz) (Per 4/13 note) IBW/kg (Calculated) : 82.2 Heparin Dosing Weight: TBW  Vital Signs: Temp: 97.6 F (36.4 C) (04/24 0748) Temp Source: Oral (04/24 0748) BP: 130/92 (04/24 0748) Pulse Rate: 61 (04/24 0748)  Labs: Recent Labs    05/27/20 0054 05/27/20 1301 05/28/20 0047 05/28/20 1335 05/28/20 2037 05/29/20 0608  HGB 12.6*  --  10.7*  --   --  10.8*  HCT 38.1*  --  33.2*  --   --  33.7*  PLT 203  --  PLATELET CLUMPS NOTED ON SMEAR, UNABLE TO ESTIMATE  --   --  113*  APTT 29   < > 142* 74* 61* 73*  HEPARINUNFRC <0.10*   < > >1.10* 0.80* 0.65 0.69  CREATININE 0.80  --  1.09  --   --  0.76   < > = values in this interval not displayed.    Estimated Creatinine Clearance: 92.8 mL/min (by C-G formula based on SCr of 0.76 mg/dL).  Assessment: 70 YOM presenting with worsening SOB and fever, previously admitted with new PE and discharged on Eliquis w/7d load, now on 5mg  BID. Last dose apix on 4/20 AM.    APTT and heparin level are therapeutic on heparin infusion at 1150 units/hr. Well correlated, so will check heparin levels only. No s/sx of bleeding or infusion issues noted. Hgb stable ~10, PLT down to 113 today, has recent history of thrombocytopenia, will continue to monitor. Calculated 4T score as 3 with Low Probability of HIT (<5%).   Goal of Therapy:  Heparin level 0.3-0.7 units/ml aPTT 66-102 seconds Monitor platelets by anticoagulation protocol: Yes   Plan:  Continue heparin to 1150 units/hr Daily heparin level, CBC Monitor for signs and symptoms of bleeding  Norina Buzzard, PharmD PGY1 Pharmacy Resident 05/29/2020 8:30 AM  Please check AMION for all [facility] phone numbers After 10:00 PM, call [facility] [phone]

## 2020-05-29 NOTE — Progress Notes (Signed)
Mobility Specialist: Progress Note   05/29/20 1701  Mobility  Activity Transferred:  Chair to bed  Level of Assistance Moderate assist, patient does 50-74%  Assistive Device Front wheel walker  Mobility Response Tolerated well  Mobility performed by Mobility specialist  $Mobility charge 1 Mobility   Assisted pt back to bed with help from NT. Pt was modA to stand and contact guard during transfer. Pt back in bed with NT present in room.   [facility] Yitzchak Kothari Mobility Specialist Mobility Specialist Phone: [phone]

## 2020-05-29 NOTE — Progress Notes (Signed)
PROGRESS NOTE                                                                                                                                                                                                             Patient Demographics:    Tyrone Roman, is a 75 y.o. male, DOB - 06-Jun-1945, [id number]  Outpatient Primary MD for the patient is [name], MD   Admit date - 05/25/2020   LOS - 4  Chief Complaint  Patient presents with  . Code Sepsis       Brief Narrative: Patient is a 75 y.o. male with PMHx of RA, CAD s/p PCI, HTN, HLD, COPD, BPH-who had COVID-19 infection in January 2021-since then has had numerous hospitalizations for shortness of breath-FUO-most recently discharged from 4/4-4/13-after extensive work-up including ID evaluation-found to have PE/DVT-maintained on prednisone 30 mg p.o. twice daily (fever responsive to steroids per prior notes)-presenting back to the hospital with 4-5-day history of recurrent fever (as high as 103 F) and worsening shortness of breath.  See below for further details.  Summary of prior Hospitalizations: 1/24 - 1/26>> admit for COVID PNA- received IV steroids/remdesivir-No baricitinib/Actemra due to underling immunosuppressants. Seen with Coag-neg staph bacteremia, ID seen with no reccs for ABT's 2/10 - 2/11>> Readmitted recurrent SOB/hypoxia with substernal CP. Concern that steroid taper was completed to quickly, patient was placed on 10 day taper. 2/18 - 2/25>> Readmitted for recurrent SOB/hypoxia. COVID PCR remained positive. Negative for PE. Received zosyn/vanc with switch to PO Augmentin at d/c. Treated with short steroid taper  2/27 - 3/2>> Readmitted and treated for SOB with fever and AMS. ? Aspiration PNA and received empiric Unasyn and switched to Augmentin on d/c 4/4 - 4/13>> Readmitted from [facility] forhypoxic respiratory failure to be due to acute on chronic  congestive heart failure. COVID PCR positive.He received broad spectrum ABT which were eventually d/c due to no obvious cause.  Had FUO of unknown etiology-with concern for possible vasculities that temporary resolves with higher doses of IV steroids. D/C'd on 30mg  BID prednisone and Eliquis for new acute PE seen on CTA chest  COVID-19 vaccinated status: Vaccinated including booster ( COVID antibodies undetectable x 2 in spite of vaccine/natural infection)  Significant Events: 4/20>> Admit to [facility] for recurrent fever/shortness of breath-significant infiltrates on CT chest-worsening clot burden.  Requiring anywhere from 10-12 L of oxygen. 4/21>> MAB infusion.  Significant studie 4/21>>> Serum SARS-COV-2 antibody-IgG: Negative 4/21>>Serum Fungitell:pending 4/20>> CTA chest: PE extending from right pulmonary artery into lobar/segmental branches, significant bilateral pulmonary infiltrates. 4/21>> B/L lower ext Doppler: Left popliteal- posterior tibial-peroneal vein DVT(Previously B/L DVT on 4/6) 4/21>> Echo: EF [id number], RV systolic function normal. 4/13>> MPO/ANCA: Negative 4/12>> SPEP: No M spike 4/10>> RA factor: 12.9 (WNL) 4/10>> ACE: 34 (WNL) 4/9>> GOLD QuantiFERON: Negative 4/9>> CMV IgG/IgM: Negative 4/9>> EBV IgM: Negative 4/9>> ANA: Negative 4/9>> Serum cryptococcal/histoplasma/blastomycosis antigen: Negative 4/9>> Serum SARS-COV-2 antibody-IgG: Negative 4/6>> bilateral lower extremity Doppler: Bilateral lower extremity DVT 2/27>> Echo: EF 60-65%  COVID-19 medications: Remdesivir: 4/21>> IV Solu-Medrol: 4/21>> Bebtelovimab: 4/21 x 1  Antibiotics: Vancomycin: 4/20 x 1 Flagyl: 4/20>> 4/21 Cefepime: 4/20 x 1  Microbiology data: 4/21>> sputum AFB smear/culture, PCP smear and culture sensitivity: Ordered 4/20 >>blood culture: No growth 4/20>> SARS 2 coronavirus PCR: Positive with cycle threshold of 24.2 4/4>> SARS 2 coronavirus PCR: Positive with cycle threshold of  29.6 2/18>> SARS 2 coronavirus PCR: Positive with cycle threshold of 37.5  Procedures: None  Consults: ID, PCCM  DVT prophylaxis: IV heparin    Subjective:   Patient in bed, appears comfortable, denies any headache, no fever, no chest pain or pressure, improved shortness of breath , no abdominal pain. No new focal weakness.   Assessment  & Plan :   Acute hypoxic respiratory failure: Multifactorial etiology-worsening clot burden/PE-likely ongoing/acute COVID-19 infection-superimposed significant bilateral pulmonary infiltrates (autoimmune/COVID inflammation versus opportunistic infection).  Remains on IV heparin-previously on oral prednisone as outpatient-has been escalated to IV Solu-Medrol-and has been started on Remdesivir.  No signs of volume overload-but will get 1 dose of IV Lasix to try and keep an negative balance.  ID/PCCM following-some mild improvement in hypoxemia compared to yesterday-Down to 9-10 L of HFNC today (was on 12 L HFNC yesterday) .  SpO2: 93 % O2 Flow Rate (L/min): 10 L/min    Acute vs persistent COVID-19 infection: Continues to test positive for COVID-19 since January-however last 2 tests in April-with significant cycle threshold-that continues to indicate the patient is infectious with Cycle counts < 30.  He unfortunately does not have serum antibodies against COVID in spite of being vaccinated and having a natural infection.  There is some suspicion-that ongoing fever/worsening hypoxemia/worsening clot burden could be from a new COVID infection that he may have picked up earlier this month.  He remains on steroids and Remdesivir-he was given 1 dose of monoclonal antibody on 4/21 (after significant discussion with ID/PCCM - Bebtelovimab ).  Some mild improvement in hypoxemia overnight.  Given concern for opportunistic infection-severe immunosuppression-probably will need to hold off on Actemra/baricitinib.    Bilateral pulmonary infiltrates:  Etiology of these  infiltrates are clear-given significant steroid use over the past several months-history of Rituxan use (last use either late December/early Jan)-concerned that these may represent opportunistic infections-and since his fever/encephalopathy have been so steroid responsive in the past-possible that this could be autoimmune/inflammation related to RA/COVID.  Ideally needs a bronchoscopy-however given severity of hypoxemia-it cannot be performed given risk that he may end up on a ventilator.  Currently empirically being treated for COVID-19 with steroid/Remdesivir-plan is to follow closely-and initiate empiric coverage against opportunistic infections (PCP/fungal/MAI etc.) if clinical condition deteriorates.  ID and PCCM following.  I have asked nursing staff to ensure that sputum studies for PCP/AFB/bacterial cultures get sent out today.  Pulmonary embolism/DVT: Has significant clot burden on CT  done on 4/20-in spite of being on Eliquis-lower extremity Doppler on 4/21 only shows left lower extremity DVT-a prior Doppler on 4/6 showed bilateral DVT.  Suspect that in spite of being on anticoagulation-could have had further embolic event from his legs to the lung-or he could have failed Eliquis.  Remains on IV heparin for now-once he is clinically more stable-and we are sure that he will not require any procedures-plan is to transition him to subcu Lovenox-to continue on discharge as he has likely failed DOAC therapy.  Echo on 4/20 without any RV strain.  FUO: Unclear etiology-this has been ongoing since his last admission-Per prior notes-fever-overall clinical condition has been steroid responsive-prior hospitalist MD-Dr. Waldron Labs had spoken e with patient's primary rheumatologist-and recommendations were to keep on prednisone 30 mg p.o. twice daily.  In spite of being on prednisone-patient continues to spike fevers.  Multiple differentials for fever at this point-possible new COVID-19 infection-worsening clot  burden-and more worrisome is that it could be from a opportunistic infection given significant pulmonary infiltrates.  ID following-culture data-work-up as above.  All antibiotics have been discontinued as of 4/21.  Chronic diastolic heart failure: Euvolemic on exam-await echo-we will keep in negative balance and dose with diuretics periodically.  Hyponatremia: Euvolemic on exam-could have SIADH pathophysiology-improved-continue to watch closely.  Dose with as needed doses of IV Lasix.  HTN: BP is stable-resume antihypertensives over the next few days.  HLD: Continue statin  History of rheumatoid arthritis: Has been on prednisone for the past several months-see above.  COPD: Continue inhaler regimen  GERD: Continue PPI  BPH: Continue Flomax  Debility/deconditioning: Due to acute on chronic illness-await PT/OT eval.  Previously had refused SNF.    Condition - Extremely Guarded  Family Communication  :    Cameron-Daughter-[phone]-left a voicemail on 4/21, 4/22, 05/29/20  Code Status :  Full Code  Diet :   Diet Order            Diet Heart Room service appropriate? Yes with Assist; Fluid consistency: Thin  Diet effective now                  Disposition Plan  :  DW [facility] transfer center 05/28/20 - Dr L.Wahid, nothing else to offer agrees with management.  Status is: Inpatient  Remains inpatient appropriate because:Inpatient level of care appropriate due to severity of illness   Dispo: The patient is from: Home              Anticipated d/c is to: TBD              Patient currently is not medically stable to d/c.   Difficult to place patient No    Barriers to discharge: Hypoxia requiring O2 supplementation/complete 5 days of IV Remdesivir  Antimicorbials  :    Anti-infectives (From admission, onward)   Start     Dose/Rate Route Frequency Ordered Stop   05/27/20 1000  remdesivir 100 mg in sodium chloride 0.9 % 100 mL IVPB  Status:  Discontinued       "Followed  by" Linked Group Details   100 mg 200 mL/hr over 30 Minutes Intravenous Daily 05/26/20 1134 05/26/20 1143   05/27/20 1000  remdesivir 100 mg in sodium chloride 0.9 % 100 mL IVPB       "Followed by" Linked Group Details   100 mg 200 mL/hr over 30 Minutes Intravenous Daily 05/26/20 1143 05/31/20 0959   05/26/20 1300  remdesivir 100 mg in sodium chloride 0.9 %  100 mL IVPB  Status:  Discontinued       "Followed by" Linked Group Details   100 mg 200 mL/hr over 30 Minutes Intravenous  Once 05/26/20 1134 05/26/20 1143   05/26/20 1200  remdesivir 200 mg in sodium chloride 0.9% 250 mL IVPB       "Followed by" Linked Group Details   200 mg 580 mL/hr over 30 Minutes Intravenous Once 05/26/20 1143 05/26/20 1503   05/26/20 0500  vancomycin (VANCOREADY) IVPB 1250 mg/250 mL  Status:  Discontinued        1,250 mg 166.7 mL/hr over 90 Minutes Intravenous Every 12 hours 05/25/20 1748 05/26/20 1108   05/26/20 0000  ceFEPIme (MAXIPIME) 2 g in sodium chloride 0.9 % 100 mL IVPB  Status:  Discontinued        2 g 200 mL/hr over 30 Minutes Intravenous Every 8 hours 05/25/20 1745 05/26/20 1108   05/25/20 2315  metroNIDAZOLE (FLAGYL) IVPB 500 mg  Status:  Discontinued        500 mg 100 mL/hr over 60 Minutes Intravenous Every 8 hours 05/25/20 2223 05/26/20 1108   05/25/20 1600  vancomycin (VANCOREADY) IVPB 1000 mg/200 mL  Status:  Discontinued        1,000 mg 200 mL/hr over 60 Minutes Intravenous  Once 05/25/20 1548 05/25/20 1555   05/25/20 1600  aztreonam (AZACTAM) 2 g in sodium chloride 0.9 % 100 mL IVPB  Status:  Discontinued        2 g 200 mL/hr over 30 Minutes Intravenous  Once 05/25/20 1548 05/25/20 1552   05/25/20 1600  ceFEPIme (MAXIPIME) 2 g in sodium chloride 0.9 % 100 mL IVPB  Status:  Discontinued        2 g 200 mL/hr over 30 Minutes Intravenous Every 8 hours 05/25/20 1555 05/25/20 1557   05/25/20 1600  vancomycin (VANCOREADY) IVPB 2000 mg/400 mL        2,000 mg 200 mL/hr over 120 Minutes  Intravenous  Once 05/25/20 1557 05/25/20 1918   05/25/20 1600  ceFEPIme (MAXIPIME) 2 g in sodium chloride 0.9 % 100 mL IVPB        2 g 200 mL/hr over 30 Minutes Intravenous  Once 05/25/20 1557 05/25/20 1647      Inpatient Medications  Scheduled Meds: . insulin aspart  0-20 Units Subcutaneous TID WC  . insulin aspart  0-5 Units Subcutaneous QHS  . insulin aspart  6 Units Subcutaneous TID WC  . insulin glargine  20 Units Subcutaneous Daily  . methylPREDNISolone (SOLU-MEDROL) injection  40 mg Intravenous Q12H  . metoprolol tartrate  50 mg Oral BID  . mometasone-formoterol  2 puff Inhalation BID  . pantoprazole  40 mg Oral Daily  . simvastatin  20 mg Oral Daily  . tamsulosin  0.4 mg Oral Daily  . umeclidinium bromide  1 puff Inhalation Daily   Continuous Infusions: . sodium chloride    . famotidine (PEPCID) IV    . heparin 1,150 Units/hr (05/28/20 2236)  . remdesivir 100 mg in NS 100 mL 100 mg (05/29/20 0826)   PRN Meds:.sodium chloride, acetaminophen **OR** [DISCONTINUED] acetaminophen, albuterol, diphenhydrAMINE, EPINEPHrine, famotidine (PEPCID) IV, melatonin   Time Spent in minutes  45  See all Orders from today for further details   Lala Lund M.D on 05/29/2020 at 9:51 AM  To page go to [email or web address] - use universal password  [facility] -  Office  [phone]    Objective:   Vitals:   05/28/20 1946 05/28/20 2338  05/29/20 0401 05/29/20 0748  BP: 119/77 135/83 (!) 148/81 (!) 130/92  Pulse: 100 79 62 61  Resp: 20 20 16 19   Temp: 97.6 F (36.4 C) 97.7 F (36.5 C) 97.6 F (36.4 C) 97.6 F (36.4 C)  TempSrc: Oral Axillary Axillary Oral  SpO2: 96% 92% 95% 93%  Weight:      Height:        Wt Readings from Last 3 Encounters:  05/25/20 88 kg  05/09/20 88 kg  05/05/20 88.5 kg     Intake/Output Summary (Last 24 hours) at 05/29/2020 0951 Last data filed at 05/29/2020 0944 Gross per 24 hour  Intake 1120.51 ml  Output 3200 ml  Net -2079.49 ml      Physical Exam  Awake Alert, No new F.N deficits, Normal affect .AT,PERRAL Supple Neck,No JVD, No cervical lymphadenopathy appriciated.  Symmetrical Chest wall movement, Good air movement bilaterally, CTAB RRR,No Gallops, Rubs or new Murmurs, No Parasternal Heave +ve B.Sounds, Abd Soft, No tenderness, No organomegaly appriciated, No rebound - guarding or rigidity. No Cyanosis, Clubbing or edema, No new Rash or bruise     Data Review:    CBC Recent Labs  Lab 05/25/20 1547 05/25/20 1626 05/26/20 0141 05/27/20 0054 05/28/20 0047 05/29/20 0608  WBC 11.6*  --  7.9 8.0 10.4 12.8*  HGB 11.5* 12.2* 10.1* 12.6* 10.7* 10.8*  HCT 36.6* 36.0* 32.2* 38.1* 33.2* 33.7*  PLT 155  --  126* 203 PLATELET CLUMPS NOTED ON SMEAR, UNABLE TO ESTIMATE 113*  MCV 89.3  --  90.2 95.3 89.0 88.5  MCH 28.0  --  28.3 31.5 28.7 28.3  MCHC 31.4  --  31.4 33.1 32.2 32.0  RDW 20.5*  --  20.2* 12.0 20.0* 19.6*  LYMPHSABS 0.2*  --   --   --   --   --   MONOABS 0.2  --   --   --   --   --   EOSABS 0.0  --   --   --   --   --   BASOSABS 0.0  --   --   --   --   --     Chemistries  Recent Labs  Lab 05/25/20 1547 05/25/20 1626 05/25/20 2231 05/27/20 0054 05/28/20 0047 05/28/20 0300 05/29/20 0608  NA 129* 128*  --  133* 130*  --  133*  K 4.6 4.7  --  4.8 4.7  --  4.4  CL 98 99  --  99 97*  --  99  CO2 21*  --   --  28 21*  --  27  GLUCOSE 225* 228*  --  110* 554*  --  265*  BUN 21 21  --  16 24*  --  30*  CREATININE 0.90 0.80  --  0.80 1.09  --  0.76  CALCIUM 9.5  --   --  8.9 9.0  --  8.9  MG  --   --  1.7  --   --  2.0 1.8  AST 29  --   --  34 29  --  24  ALT 27  --   --  27 24  --  28  ALKPHOS 76  --   --  58 82  --  71  BILITOT 0.8  --   --  0.8 0.3  --  0.5   ------------------------------------------------------------------------------------------------------------------ No results for input(s): CHOL, HDL, LDLCALC, TRIG, CHOLHDL, LDLDIRECT in the last 72 hours.  Lab Results  Component Value Date   HGBA1C 6.3 (H) 05/12/2020   ------------------------------------------------------------------------------------------------------------------ No results for input(s): TSH, T4TOTAL, T3FREE, THYROIDAB in the last 72 hours.  Invalid input(s): FREET3 ------------------------------------------------------------------------------------------------------------------ No results for input(s): VITAMINB12, FOLATE, FERRITIN, TIBC, IRON, RETICCTPCT in the last 72 hours.  Coagulation profile Recent Labs  Lab 05/25/20 1547  INR 1.7*    No results for input(s): DDIMER in the last 72 hours.  Cardiac Enzymes No results for input(s): CKMB, TROPONINI, MYOGLOBIN in the last 168 hours.  Invalid input(s): CK ------------------------------------------------------------------------------------------------------------------    Component Value Date/Time   BNP 503.2 (H) 05/29/2020 [id number]    Micro Results Recent Results (from the past 240 hour(s))  Resp Panel by RT-PCR (Flu A&B, Covid) Nasopharyngeal Swab     Status: Abnormal   Collection Time: 05/25/20  3:56 PM   Specimen: Nasopharyngeal Swab; Nasopharyngeal(NP) swabs in vial transport medium  Result Value Ref Range Status   SARS Coronavirus 2 by RT PCR POSITIVE (A) NEGATIVE Final    Comment: RESULT CALLED TO, READ BACK BY AND VERIFIED WITH: Sherlean Foot RN 2032 05/25/20 A BROWNING (NOTE) SARS-CoV-2 target nucleic acids are DETECTED.  The SARS-CoV-2 RNA is generally detectable in upper respiratory specimens during the acute phase of infection. Positive results are indicative of the presence of the identified virus, but do not rule out bacterial infection or co-infection with other pathogens not detected by the test. Clinical correlation with patient history and other diagnostic information is necessary to determine patient infection status. The expected result is Negative.  Fact Sheet for  Patients: EntrepreneurPulse.com.au  Fact Sheet for Healthcare Providers: IncredibleEmployment.be  This test is not yet approved or cleared by the [place] FDA and  has been authorized for detection and/or diagnosis of SARS-CoV-2 by FDA under an Emergency Use Authorization (EUA).  This EUA will remain in effect (meaning this test can  be used) for the duration of  the COVID-19 declaration under Section 564(b)(1) of the Act, 21 U.S.C. section 360bbb-3(b)(1), unless the authorization is terminated or revoked sooner.     Influenza A by PCR NEGATIVE NEGATIVE Final   Influenza B by PCR NEGATIVE NEGATIVE Final    Comment: (NOTE) The Xpert Xpress SARS-CoV-2/FLU/RSV plus assay is intended as an aid in the diagnosis of influenza from Nasopharyngeal swab specimens and should not be used as a sole basis for treatment. Nasal washings and aspirates are unacceptable for Xpert Xpress SARS-CoV-2/FLU/RSV testing.  Fact Sheet for Patients: EntrepreneurPulse.com.au  Fact Sheet for Healthcare Providers: IncredibleEmployment.be  This test is not yet approved or cleared by the [place] FDA and has been authorized for detection and/or diagnosis of SARS-CoV-2 by FDA under an Emergency Use Authorization (EUA). This EUA will remain in effect (meaning this test can be used) for the duration of the COVID-19 declaration under Section 564(b)(1) of the Act, 21 U.S.C. section 360bbb-3(b)(1), unless the authorization is terminated or revoked.  Performed at [facility], [place] [address], [place], [place] [address]   Blood Culture (routine x 2)     Status: None (Preliminary result)   Collection Time: 05/25/20  3:59 PM   Specimen: BLOOD  Result Value Ref Range Status   Specimen Description BLOOD SITE NOT SPECIFIED  Final   Special Requests   Final    BOTTLES DRAWN AEROBIC AND ANAEROBIC Blood Culture results may not be  optimal due to an excessive volume of blood received in culture bottles   Culture   Final    NO GROWTH 4 DAYS  Performed at [facility], [place] [address] [place], [place] [address]    Report Status PENDING  Incomplete  Blood Culture (routine x 2)     Status: None (Preliminary result)   Collection Time: 05/25/20  6:18 PM   Specimen: BLOOD RIGHT HAND  Result Value Ref Range Status   Specimen Description BLOOD RIGHT HAND  Final   Special Requests   Final    BOTTLES DRAWN AEROBIC AND ANAEROBIC Blood Culture adequate volume   Culture   Final    NO GROWTH 4 DAYS Performed at [facility], [name] [address], [place], [place] [address]    Report Status PENDING  Incomplete  Urine culture     Status: Abnormal (Preliminary result)   Collection Time: 05/25/20  7:16 PM   Specimen: In/Out Cath Urine  Result Value Ref Range Status   Specimen Description IN/OUT CATH URINE  Final   Special Requests NONE  Final   Culture (A)  Final    20,000 COLONIES/mL STAPHYLOCOCCUS EPIDERMIDIS SUSCEPTIBILITIES TO FOLLOW Performed at [facility], [place] [address], [place], [place] [address]    Report Status PENDING  Incomplete  Expectorated Sputum Assessment w Gram Stain, Rflx to Resp Cult     Status: None   Collection Time: 05/27/20  1:15 PM   Specimen: Expectorated Sputum  Result Value Ref Range Status   Specimen Description EXPECTORATED SPUTUM  Final   Special Requests NONE  Final   Sputum evaluation   Final    Sputum specimen not acceptable for testing.  Please recollect.   RESULT CALLED TO, READ BACK BY AND VERIFIED WITH: A PINCHER RN 1425 05/27/20 A BROWNING Performed at [facility], [place] [address], [place], [place] [address]    Report Status 05/27/2020 FINAL  Final  Acid Fast Smear (AFB)     Status: None   Collection Time: 05/27/20  1:15 PM   Specimen: Sputum  Result Value Ref Range Status   AFB Specimen Processing Concentration  Final   Acid Fast Smear Negative  Final     Comment: (NOTE) Performed At: [facility] [place], [place] [id number] Rush Farmer MD [id number]    Source (AFB) EXPECTORATED SPUTUM  Final    Comment: Performed at [facility], [place] [address], [place], [place] [address]  Culture, fungus without smear     Status: None (Preliminary result)   Collection Time: 05/27/20  1:15 PM   Specimen: Sputum; Other  Result Value Ref Range Status   Specimen Description EXPECTORATED SPUTUM  Final   Special Requests NONE  Final   Culture   Final    CULTURE REINCUBATED FOR BETTER GROWTH Performed at [facility], [address], [place],  [address]    Report Status PENDING  Incomplete    Radiology Reports CT Angio Chest PE W and/or Wo Contrast  Result Date: 05/25/2020 CLINICAL DATA:  75 year old male with shortness of breath and chest pain. Concern for pulmonary embolism. EXAM: CT ANGIOGRAPHY CHEST WITH CONTRAST TECHNIQUE: Multidetector CT imaging of the chest was performed using the standard protocol during bolus administration of intravenous contrast. Multiplanar CT image reconstructions and MIPs were obtained to evaluate the vascular anatomy. CONTRAST:  59mL OMNIPAQUE IOHEXOL 350 MG/ML SOLN COMPARISON:  Chest CT dated 05/14/2020. FINDINGS: Cardiovascular: Mild cardiomegaly. No pericardial effusion. There is coronary vascular calcification. Mild atherosclerotic calcification of the thoracic aorta. Linear nonocclusive thrombus noted extending from the central right pulmonary artery  into the lobar and segmental branches of the right lower lobe and right upper lobe. This is new since the prior CT. No CT evidence of right heart straining. Mediastinum/Nodes: Top-normal right hilar lymph nodes. The esophagus and the thyroid gland are grossly unremarkable. No mediastinal fluid collection. Lungs/Pleura: Bilateral patchy and streaky densities similar to prior CT. Calcified pleural plaque noted in the left upper lobe  anteriorly. No pleural effusion pneumothorax. The central airways are patent. Upper Abdomen: No acute abnormality. Musculoskeletal: No chest wall abnormality. No acute or significant osseous findings. Review of the MIP images confirms the above findings. IMPRESSION: 1. Pulmonary artery embolus extending from the central right pulmonary artery into the lobar and segmental branches of the right lower lobe and right upper lobe, new since the prior CT. No CT evidence of right heart straining. 2. Bilateral patchy and streaky densities similar to prior CT. 3. Aortic Atherosclerosis (ICD10-I70.0). These results were called by telephone at the time of interpretation on 05/25/2020 at 6:39 pm to provider DAVID YAO , who verbally acknowledged these results. Electronically Signed   By: Anner Crete M.D.   On: 05/25/2020 18:43   CT ANGIO CHEST PE W OR WO CONTRAST  Result Date: 05/14/2020 CLINICAL DATA:  High probability sepsis due to pneumonia EXAM: CT ANGIOGRAPHY CHEST WITH CONTRAST TECHNIQUE: Multidetector CT imaging of the chest was performed using the standard protocol during bolus administration of intravenous contrast. Multiplanar CT image reconstructions and MIPs were obtained to evaluate the vascular anatomy. CONTRAST:  30mL OMNIPAQUE IOHEXOL 350 MG/ML SOLN COMPARISON:  04/02/2020 FINDINGS: Cardiovascular: Satisfactory opacification by motion of pulmonary artifact. Arteries although limited there is a convincing branching pulmonary artery filling defect affecting segmental vessels in the basal right lower lobe as marked on series 6. Additional peripheral emboli could be present and obscured by the degree of motion artifact. No central embolism is seen. No evidence of right heart strain. Heart size is normal. No pericardial effusion. Atherosclerosis of the coronaries. Mediastinum/Nodes: Negative for adenopathy or mass. Lungs/Pleura: Chronic lung disease with reticulation. Bilateral pleural plaque appearance without  calcification. There is increased ground-glass density scattered in the bilateral lungs, greatest in the right upper lobe. No interlobular septal thickening or pleural fluid to implicate failure. Mild centrilobular emphysema. Upper Abdomen: Negative Musculoskeletal: No unexpected finding Review of the MIP images confirms the above findings. Critical Value/emergent results were called by telephone at the time of interpretation on 05/14/2020 at 8:35 am to provider Elgergawy, who verbally acknowledged these results. IMPRESSION: 1. Positive for segmental pulmonary embolism in the right lower lobe. Additional pulmonary emboli could be obscured by the degree of motion artifact. 2. Background of chronic opacities related to recent COVID infection. There is also new ground-glass opacity especially in the right upper lobe suggesting a superimposed acute pneumonia. 3. Aortic Atherosclerosis (ICD10-I70.0).  Coronary atherosclerosis. Electronically Signed   By: Monte Fantasia M.D.   On: 05/14/2020 08:35   MR BRAIN W WO CONTRAST  Result Date: 05/16/2020 CLINICAL DATA:  Delirium; fever with unknown origin, altered mental status, evaluate for encephalitis. EXAM: MRI HEAD WITHOUT AND WITH CONTRAST TECHNIQUE: Multiplanar, multiecho pulse sequences of the brain and surrounding structures were obtained without and with intravenous contrast. CONTRAST:  58mL GADAVIST GADOBUTROL 1 MMOL/ML IV SOLN COMPARISON:  Prior head CT examinations 04/02/2020 and earlier. Brain MRI 12/13/2017. FINDINGS: Brain: Mild cerebral and cerebellar atrophy. AP punctate focus of restricted diffusion is questioned within the right parietal lobe (versus artifact) (series 3, image 33). Additionally, there is  apparent subtle restricted diffusion along the midline callosal splenium, slightly eccentric to the left. A few small foci of T2/FLAIR hyperintensity within the bilateral cerebral white matter and right thalamus are nonspecific, but likely reflect minimal  chronic small vessel ischemic disease. Redemonstrated left retrocerebellar arachnoid cyst. There is no acute infarct. No evidence of intracranial mass. No chronic intracranial blood products. No extra-axial fluid collection. No midline shift. No abnormal intracranial enhancement. Vascular: Expected proximal arterial flow voids. Skull and upper cervical spine: No focal marrow lesion. Incompletely assessed cervical spondylosis. Suspected C4-C5 vertebral body ankylosis. Sinuses/Orbits: Visualized orbits show no acute finding. Bilateral lens replacements. Mild paranasal sinus mucosal thickening, most notably within the bilateral ethmoid air cells. Small left maxillary sinus mucous retention cyst. Other: Right mastoid effusion. IMPRESSION: Punctate acute infarct versus artifact within the right parietal lobe. Additionally, there is apparent subtle restricted diffusion within the midline callosal splenium, slightly eccentric to the left. This may reflect a small focus of acute ischemia or artifact. Alternatively, this may reflect a cytotoxic lesion of the corpus callosum (which has a broad range of potential etiologies and clinical associations), although the appearance would be atypical for this entity. Background mild parenchymal atrophy and chronic small vessel ischemic disease. Mild paranasal sinus disease as described. Right mastoid effusion. Electronically Signed   By: Kellie Simmering DO   On: 05/16/2020 18:51   MR CERVICAL SPINE W WO CONTRAST  Result Date: 05/17/2020 CLINICAL DATA:  Initial screening for possible epidural abscess. EXAM: MRI CERVICAL SPINE WITHOUT AND WITH CONTRAST TECHNIQUE: Multiplanar and multiecho pulse sequences of the cervical spine, to include the craniocervical junction and cervicothoracic junction, were obtained without and with intravenous contrast. CONTRAST:  73mL GADAVIST GADOBUTROL 1 MMOL/ML IV SOLN COMPARISON:  Prior MRI from 03/24/2010. FINDINGS: Alignment: Reversal of the normal  cervical lordosis with apex at C3-4. Trace anterolisthesis of C3 on C4 and C7 on T1, chronic and facet mediated. Vertebrae: Vertebral body height maintained without acute or chronic fracture. C4 and C5 vertebral bodies are largely ankylosed, likely degenerative. Bone marrow signal intensity within normal limits. No discrete or worrisome osseous lesions. No abnormal marrow edema or enhancement. No findings to suggest osteomyelitis discitis or septic arthritis. Cord: Normal signal and morphology. No epidural abscess or other collection. Posterior Fossa, vertebral arteries, paraspinal tissues: Probable benign retro cerebellar arachnoid cyst noted. Visualized brain and posterior fossa otherwise unremarkable. Craniocervical junction within normal limits. Paraspinous and prevertebral soft tissues normal. Normal flow voids seen within the vertebral arteries bilaterally. Disc levels: C2-C3: Negative interspace. Right worse than left facet hypertrophy. No spinal stenosis. Mild right C3 foraminal narrowing. Left neural foramina remains patent. C3-C4: Broad-based posterior disc osteophyte flattens and effaces the ventral thecal sac. Mild flattening of the ventral cord without cord signal changes. Mild spinal stenosis. Superimposed bilateral facet hypertrophy with left greater than right uncovertebral spurring. Severe left with moderate right C4 foraminal stenosis. C4-C5: C4 and C5 vertebral bodies are largely ankylosed. Broad posterior endplate osseous ridging flattens and effaces the ventral thecal sac. Mild spinal stenosis without cord impingement. Moderate right worse than left C5 foraminal stenosis. C5-C6: Degenerative intervertebral disc space narrowing with diffuse disc osteophyte complex. Mild flattening of the ventral thecal sac without significant spinal stenosis. Moderate left worse than right C6 foraminal narrowing. C6-C7: Degenerative intervertebral disc space narrowing with diffuse disc osteophyte complex. No  significant spinal stenosis. Severe left with moderate right C7 foraminal stenosis. C7-T1: Trace anterolisthesis. No significant disc bulge. Moderate bilateral facet and ligament flavum hypertrophy.  No significant spinal stenosis. Foramina remain patent. IMPRESSION: 1. No evidence for osteomyelitis discitis or septic arthritis within the cervical spine. No epidural abscess or other infection. 2. Multilevel cervical spondylosis with resultant mild spinal stenosis at C3-4 and C4-5. 3. Multifactorial degenerative changes with resultant multilevel foraminal narrowing as above. Notable findings include severe left with moderate right C4 foraminal stenosis, moderate bilateral C5 and C6 foraminal narrowing, with severe left and moderate right C7 foraminal stenosis. Electronically Signed   By: Jeannine Boga M.D.   On: 05/17/2020 21:40   MR THORACIC SPINE W WO CONTRAST  Result Date: 05/17/2020 CLINICAL DATA:  Initial screening for possible infection, epidural abscess. EXAM: MRI THORACIC WITHOUT AND WITH CONTRAST TECHNIQUE: Multiplanar and multiecho pulse sequences of the thoracic spine were obtained without and with intravenous contrast. CONTRAST:  10mL GADAVIST GADOBUTROL 1 MMOL/ML IV SOLN COMPARISON:  None available. FINDINGS: Alignment: Straightening of the normal thoracic kyphosis. No listhesis. Vertebrae: Vertebral body height maintained without acute or chronic fracture. Bone marrow signal intensity within normal limits. Few scattered benign hemangiomata noted. No worrisome osseous lesions. No abnormal marrow edema or enhancement. No findings to suggest osteomyelitis discitis or septic arthritis. Cord: Normal signal and morphology. No epidural abscess or other collection. No abnormal enhancement. Paraspinal and other soft tissues: Paraspinous soft tissues demonstrate no acute finding. Small layering bilateral pleural effusions noted, left slightly larger than right. T2 hyperintense benign appearing cyst  partially visualized extending from the right kidney. Visualized visceral structures otherwise grossly unremarkable. Disc levels: T1-2:  Unremarkable. T2-3: Tiny right paracentral disc extrusion with superior migration. Mild right-sided facet hypertrophy. No spinal stenosis. Foramina remain patent. T3-4: Minimal disc bulge. No spinal stenosis. Foramina remain patent. T4-5: Normal interspace. Mild right-sided facet hypertrophy. No stenosis. T5-6:  Normal interspace.  Mild facet hypertrophy.  No stenosis. T6-7:  Unremarkable. T7-8: Mild degenerative intervertebral disc space narrowing with disc bulge. No spinal stenosis. Foramina remain patent. T8-9: Mild disc bulge. No spinal stenosis. Foramina remain patent. T9-10: Mild disc bulge with posterior element hypertrophy. No significant spinal stenosis. Mild bilateral foraminal narrowing. T10-11: Diffuse disc bulge, asymmetric to the right. Moderate facet and ligament flavum hypertrophy. Resultant mild to moderate spinal stenosis with mild cord flattening. No cord signal changes. Moderate bilateral foraminal narrowing. T11-12: Negative interspace. Bilateral facet hypertrophy. No significant spinal stenosis. Mild bilateral foraminal narrowing. T12-L1: Disc desiccation with small endplate Schmorl's node deformities. No stenosis. IMPRESSION: 1. No MRI evidence for osteomyelitis discitis or septic arthritis within the thoracic spine. No epidural abscess or other infection. 2. Disc bulging with facet hypertrophy at T10-11 with resultant mild to moderate spinal stenosis and moderate bilateral foraminal narrowing. 3. Additional mild spondylosis elsewhere within the thoracic spine without significant stenosis. 4. Small layering bilateral pleural effusions, left slightly larger than right. Electronically Signed   By: Jeannine Boga M.D.   On: 05/17/2020 21:52   MR Lumbar Spine W Wo Contrast  Result Date: 05/13/2020 CLINICAL DATA:  Fever, lumbar spine tenderness EXAM: MRI  LUMBAR SPINE WITHOUT AND WITH CONTRAST TECHNIQUE: Multiplanar and multiecho pulse sequences of the lumbar spine were obtained without and with intravenous contrast. CONTRAST:  2mL GADAVIST GADOBUTROL 1 MMOL/ML IV SOLN COMPARISON:  04/03/2018 FINDINGS: Segmentation:  Standard. Alignment:  Stable. Vertebrae: There is now fusion across the L1-L2 disc space. There is no new marrow edema or enhancement. Multilevel degenerative plate irregularity and Schmorl's nodes. Conus medullaris and cauda equina: Conus extends to the L1-L2 level. Conus and cauda equina appear normal. No intrathecal  enhancement. Paraspinal and other soft tissues: Mild lower lumbar paraspinal edema. No evidence of collection. Disc levels: L1-L2: Prior right laminectomy. Stable retrolisthesis with bridging endplate osteophytes effacing the ventral thecal sac. Similar mild to moderate foraminal stenosis. L2-L3: Disc bulge. Facet arthropathy with ligamentum flavum infolding. Slightly increased mild canal stenosis. Similar mild foraminal stenosis. L3-L4: Disc bulge. Facet arthropathy with ligamentum flavum infolding. Similar moderate canal stenosis with effacement of subarticular recesses. Similar mild to moderate left greater than right foraminal stenosis. L4-L5: Disc bulge with superimposed left subarticular/foraminal protrusion. Facet arthropathy with ligamentum flavum infolding. Similar mild canal stenosis. Similar partial effacement of the left subarticular recess. Similar mild foraminal stenosis. L5-S1: Disc bulge with endplate osteophytic ridging. Facet arthropathy. No canal stenosis. Similar mild foraminal stenosis. IMPRESSION: No evidence of discitis/osteomyelitis. Multilevel degenerative changes as detailed above without substantial change since the prior examination. Electronically Signed   By: Macy Mis M.D.   On: 05/13/2020 14:25   CT ABDOMEN PELVIS W CONTRAST  Result Date: 05/15/2020 CLINICAL DATA:  Sepsis.  Fever of unknown origin.  EXAM: CT ABDOMEN AND PELVIS WITH CONTRAST TECHNIQUE: Multidetector CT imaging of the abdomen and pelvis was performed using the standard protocol following bolus administration of intravenous contrast. CONTRAST:  177mL OMNIPAQUE IOHEXOL 300 MG/ML  SOLN COMPARISON:  04/02/2020.  Chest CTA obtained earlier today. FINDINGS: Lower chest: Progressive pulmonary embolism on the right, with emboli currently demonstrated in the distal right main pulmonary artery and significantly more clot in the intersegmental and proximal right lower lobe pulmonary arteries. Bilateral lung densities and atelectasis with improvement since this morning. Hepatobiliary: Diffuse low density of the liver relative to the spleen. Normal appearing gallbladder. Pancreas: Unremarkable. No pancreatic ductal dilatation or surrounding inflammatory changes. Spleen: Normal in size without focal abnormality. Adrenals/Urinary Tract: Normal appearing adrenal glands. Bilateral renal cysts. Multiple small bilateral renal calculi. No bladder or ureteral calculi are seen. Excreted contrast in the urinary bladder from the CTA earlier today. Stomach/Bowel: Multiple colonic diverticula without evidence of diverticulitis. Unremarkable stomach, small bowel and appendix. Vascular/Lymphatic: Atheromatous arterial calcifications without aneurysm. No enlarged lymph nodes. Two renal arteries on the left. Reproductive: Mildly enlarged prostate gland. Other: Small bilateral inguinal hernias containing fat and very small umbilical artery containing fat. Musculoskeletal: Stable lumbar and lower thoracic spine degenerative changes and fusion of the L1 and L2 vertebral bodies. IMPRESSION: 1. Progressive pulmonary embolism on the right, with emboli currently demonstrated in the distal right main pulmonary artery and significantly more clot in the intersegmental and proximal right lower lobe pulmonary arteries. These results will be called to the ordering clinician or  representative by the Radiologist Assistant, and communication documented in the PACS or [employer]. 2. Lung densities and atelectasis with improvement since this morning. 3. Diffuse hepatic steatosis. 4. Multiple small, nonobstructing bilateral renal calculi. 5. Colonic diverticulosis. Electronically Signed   By: Claudie Revering M.D.   On: 05/15/2020 22:17   VAS [place] IVC/ILIAC (VENOUS ONLY)  Result Date: 05/11/2020 IVC/ILIAC STUDY Indications: DVT, recent COVID Limitations: Air/bowel gas.  Comparison Study: No prior study Performing Technologist: Maudry Mayhew MHA, RDMS, RVT, RDCS  Examination Guidelines: A complete evaluation includes B-mode imaging, spectral Doppler, color Doppler, and power Doppler as needed of all accessible portions of each vessel. Bilateral testing is considered an integral part of a complete examination. Limited examinations for reoccurring indications may be performed as noted.  IVC/Iliac Findings: +----------+------+--------+--------+    IVC    PatentThrombusComments +----------+------+--------+--------+ IVC Mid   patent                 +----------+------+--------+--------+  IVC Distalpatent                 +----------+------+--------+--------+  +-------------------+---------+-----------+---------+-----------+--------+         CIV        RT-PatentRT-ThrombusLT-PatentLT-ThrombusComments +-------------------+---------+-----------+---------+-----------+--------+ Common Iliac Prox                       patent                      +-------------------+---------+-----------+---------+-----------+--------+ Common Iliac Mid                        patent                      +-------------------+---------+-----------+---------+-----------+--------+ Common Iliac Distal patent              patent                      +-------------------+---------+-----------+---------+-----------+--------+   +-------------------------+---------+-----------+---------+-----------+--------+            EIV           RT-PatentRT-ThrombusLT-PatentLT-ThrombusComments +-------------------------+---------+-----------+---------+-----------+--------+ External Iliac Vein Prox                      patent                      +-------------------------+---------+-----------+---------+-----------+--------+ External Iliac Vein       patent                                          Distal                                                                    +-------------------------+---------+-----------+---------+-----------+--------+   Summary: IVC/Iliac: No evidence of thrombus in IVC and Iliac veins.  *See table(s) above for measurements and observations.  Electronically signed by Harold Barban MD on 05/11/2020 at 9:55:50 PM.    Final    DG Chest Port 1 View  Result Date: 05/29/2020 CLINICAL DATA:  Shortness of breath.  Covid-19 viral infection. EXAM: PORTABLE CHEST 1 VIEW COMPARISON:  05/28/2020 FINDINGS: Heart size remains within normal limits. Diffuse interstitial infiltrates are again seen with mild patchy airspace opacities in the left lung base. These show no significant change since previous study. No evidence of pleural effusion. IMPRESSION: No significant change in diffuse interstitial infiltrates with mild patchy airspace disease in left lower lung. Electronically Signed   By: Marlaine Hind M.D.   On: 05/29/2020 08:36   DG Chest Port 1 View  Result Date: 05/28/2020 CLINICAL DATA:  Shortness of breath EXAM: PORTABLE CHEST 1 VIEW COMPARISON:  Three days ago FINDINGS: Patchy bilateral pulmonary infiltrate without convincing progression. Lung volumes are low. No visible effusion or air leak. Normal heart size and stable mediastinal contours. IMPRESSION: Similar degree of bilateral pulmonary infiltrates. No new abnormality. Electronically Signed   By: Monte Fantasia M.D.   On: 05/28/2020 07:38    DG Chest Port 1 View  Result Date: 05/25/2020 CLINICAL DATA:  75 year old male with concern  for sepsis. EXAM: PORTABLE CHEST 1 VIEW COMPARISON:  Chest CT dated 05/14/2020. FINDINGS: Diffuse bilateral streaky interstitial densities worsened since 05/09/2020 but relatively similar to 05/10/2020 concerning for residual infiltrate, likely on the background of chronic changes. Clinical correlation and follow-up recommended. No lobar consolidation, pleural effusion, pneumothorax. The cardiac silhouette is within limits. No acute osseous pathology. IMPRESSION: Persistent bilateral infiltrates. Electronically Signed   By: Anner Crete M.D.   On: 05/25/2020 16:24   DG Chest Port 1 View  Result Date: 05/10/2020 CLINICAL DATA:  Short of breath, history of COPD EXAM: PORTABLE CHEST 1 VIEW COMPARISON:  Prior chest x-ray dated yesterday FINDINGS: Stable cardiac and mediastinal contours. Marked interval increase in diffuse interstitial prominence bilaterally now with Kerley B-lines in the periphery. Findings are most consistent with worsening CHF. No large effusion or pneumothorax. No acute osseous abnormality. IMPRESSION: Increasing interstitial pulmonary edema concerning for progressive CHF. Electronically Signed   By: Jacqulynn Cadet M.D.   On: 05/10/2020 07:54   DG Chest Port 1 View  Result Date: 05/09/2020 CLINICAL DATA:  Possible sepsis Fever and cough EXAM: PORTABLE CHEST 1 VIEW COMPARISON:  04/04/2020 FINDINGS: Heart size within normal limits. No pulmonary vascular congestion. Interval improvement in aeration of the lungs with bilateral airspace opacities still remaining. IMPRESSION: Interval improvement in aeration of the lungs with bilateral opacities still remaining consistent with resolving pneumonia. Electronically Signed   By: Miachel Roux M.D.   On: 05/09/2020 12:56   ECHOCARDIOGRAM COMPLETE  Result Date: 05/26/2020    ECHOCARDIOGRAM REPORT   Patient Name:   GARDINER SYDOW Date of Exam:  05/26/2020 Medical Rec #:  [id number]      Height:       74.0 in Accession #:    [id number]     Weight:       194.0 lb Date of Birth:  [birth year]      BSA:          2.146 m Patient Age:    11 years       BP:           129/92 mmHg Patient Gender: M              HR:           84 bpm. Exam Location:  Inpatient Procedure: 2D Echo, Cardiac Doppler, Color Doppler and Intracardiac            Opacification Agent Indications:    Pulmonary Embolus I26.09  History:        Patient has prior history of Echocardiogram examinations, most                 recent 04/03/2020. CAD and Previous Myocardial Infarction, COPD,                 Signs/Symptoms:Shortness of Breath; Risk Factors:Hypertension                 and Dyslipidemia. COVID 19. Sepsis, due to unspecified organism,                 unspecified whether acute organ dysfunction present                 Multiple subsegmental pulmonary emboli without acute cor                 pulmonale.  Sonographer:    Darlina Sicilian RDCS Referring Phys: [id number] [place]  1. Left ventricular ejection fraction, by estimation, is 50 to 55%.  The left ventricle has low normal function. The left ventricle has no regional wall motion abnormalities. There is severe asymmetric left ventricular hypertrophy of the basal and septal  segments. Left ventricular diastolic parameters are consistent with Grade II diastolic dysfunction (pseudonormalization). Elevated left ventricular end-diastolic pressure.  2. Right ventricular systolic function is normal. The right ventricular size is normal.  3. The mitral valve is abnormal. No evidence of mitral valve regurgitation. No evidence of mitral stenosis.  4. The aortic valve is tricuspid. There is mild calcification of the aortic valve. Aortic valve regurgitation is not visualized. Mild aortic valve sclerosis is present, with no evidence of aortic valve stenosis.  5. Aortic dilatation noted. There is mild dilatation of the aortic root, measuring  41 mm.  6. The inferior vena cava is normal in size with greater than 50% respiratory variability, suggesting right atrial pressure of 3 mmHg. FINDINGS  Left Ventricle: Left ventricular ejection fraction, by estimation, is 50 to 55%. The left ventricle has low normal function. The left ventricle has no regional wall motion abnormalities. Definity contrast agent was given IV to delineate the left ventricular endocardial borders. The left ventricular internal cavity size was normal in size. There is severe asymmetric left ventricular hypertrophy of the basal and septal segments. Left ventricular diastolic parameters are consistent with Grade II diastolic dysfunction (pseudonormalization). Elevated left ventricular end-diastolic pressure. Right Ventricle: The right ventricular size is normal. No increase in right ventricular wall thickness. Right ventricular systolic function is normal. Left Atrium: Left atrial size was normal in size. Right Atrium: Right atrial size was normal in size. Pericardium: There is no evidence of pericardial effusion. Mitral Valve: The mitral valve is abnormal. There is mild thickening of the mitral valve leaflet(s). There is mild calcification of the mitral valve leaflet(s). Mild mitral annular calcification. No evidence of mitral valve regurgitation. No evidence of mitral valve stenosis. Tricuspid Valve: The tricuspid valve is normal in structure. Tricuspid valve regurgitation is not demonstrated. No evidence of tricuspid stenosis. Aortic Valve: The aortic valve is tricuspid. There is mild calcification of the aortic valve. Aortic valve regurgitation is not visualized. Mild aortic valve sclerosis is present, with no evidence of aortic valve stenosis. Pulmonic Valve: The pulmonic valve was normal in structure. Pulmonic valve regurgitation is not visualized. No evidence of pulmonic stenosis. Aorta: The aortic root is normal in size and structure and aortic dilatation noted. There is mild  dilatation of the aortic root, measuring 41 mm. Venous: The inferior vena cava is normal in size with greater than 50% respiratory variability, suggesting right atrial pressure of 3 mmHg. IAS/Shunts: No atrial level shunt detected by color flow Doppler.  LEFT VENTRICLE PLAX 2D LVIDd:         3.20 cm     Diastology LVIDs:         2.60 cm     LV e' medial:    3.15 cm/s LV PW:         1.40 cm     LV E/e' medial:  17.1 LV IVS:        2.00 cm     LV e' lateral:   3.37 cm/s LVOT diam:     2.00 cm     LV E/e' lateral: 16.0 LV SV:         41 LV SV Index:   19 LVOT Area:     3.14 cm  LV Volumes (MOD) LV vol d, MOD A2C: 91.2 ml LV vol d, MOD A4C:  78.4 ml LV vol s, MOD A2C: 43.8 ml LV vol s, MOD A4C: 36.8 ml LV SV MOD A2C:     47.4 ml LV SV MOD A4C:     78.4 ml LV SV MOD BP:      44.4 ml RIGHT VENTRICLE RV S prime:     8.16 cm/s LEFT ATRIUM             Index       RIGHT ATRIUM          Index LA diam:        3.00 cm 1.40 cm/m  RA Area:     6.38 cm LA Vol (A2C):   20.1 ml 9.37 ml/m  RA Volume:   7.69 ml  3.58 ml/m LA Vol (A4C):   21.3 ml 9.93 ml/m LA Biplane Vol: 21.6 ml 10.07 ml/m  AORTIC VALVE LVOT Vmax:   80.50 cm/s LVOT Vmean:  60.800 cm/s LVOT VTI:    0.130 m  AORTA Ao Root diam: 4.10 cm Ao Asc diam:  3.40 cm MITRAL VALVE MV Area (PHT): 3.01 cm    SHUNTS MV Decel Time: 252 msec    Systemic VTI:  0.13 m MV E velocity: 53.80 cm/s  Systemic Diam: 2.00 cm MV A velocity: 81.50 cm/s MV E/A ratio:  0.66 Jenkins Rouge MD Electronically signed by Jenkins Rouge MD Signature Date/Time: 05/26/2020/2:30:51 PM    Final    VAS [place] LOWER EXTREMITY VENOUS (DVT)  Result Date: 05/26/2020  Lower Venous DVT Study Indications: Edema, and Swelling.  Comparison Study: 05/11/20 previous Performing Technologist: Abram Sander RVS  Examination Guidelines: A complete evaluation includes B-mode imaging, spectral Doppler, color Doppler, and power Doppler as needed of all accessible portions of each vessel. Bilateral testing is considered an  integral part of a complete examination. Limited examinations for reoccurring indications may be performed as noted. The reflux portion of the exam is performed with the patient in reverse Trendelenburg.  +---------+---------------+---------+-----------+----------+--------------+ RIGHT    CompressibilityPhasicitySpontaneityPropertiesThrombus Aging +---------+---------------+---------+-----------+----------+--------------+ CFV      Full           Yes      Yes                                 +---------+---------------+---------+-----------+----------+--------------+ SFJ      Full                                                        +---------+---------------+---------+-----------+----------+--------------+ FV Prox  Full                                                        +---------+---------------+---------+-----------+----------+--------------+ FV Mid   Full                                                        +---------+---------------+---------+-----------+----------+--------------+ FV DistalFull                                                        +---------+---------------+---------+-----------+----------+--------------+  PFV      Full                                                        +---------+---------------+---------+-----------+----------+--------------+ POP      Full           Yes      Yes                                 +---------+---------------+---------+-----------+----------+--------------+ PTV      Full                                                        +---------+---------------+---------+-----------+----------+--------------+ PERO     Full                                                        +---------+---------------+---------+-----------+----------+--------------+   +---------+---------------+---------+-----------+----------+-----------------+ LEFT      CompressibilityPhasicitySpontaneityPropertiesThrombus Aging    +---------+---------------+---------+-----------+----------+-----------------+ CFV      Full           Yes      Yes                                    +---------+---------------+---------+-----------+----------+-----------------+ SFJ      Full                                                           +---------+---------------+---------+-----------+----------+-----------------+ FV Prox  Full                                                           +---------+---------------+---------+-----------+----------+-----------------+ FV Mid   Full                                                           +---------+---------------+---------+-----------+----------+-----------------+ FV DistalFull                                                           +---------+---------------+---------+-----------+----------+-----------------+ PFV      Full                                                           +---------+---------------+---------+-----------+----------+-----------------+  POP      None           No       No                   Age Indeterminate +---------+---------------+---------+-----------+----------+-----------------+ PTV      None                                         Age Indeterminate +---------+---------------+---------+-----------+----------+-----------------+ PERO     None                                         Age Indeterminate +---------+---------------+---------+-----------+----------+-----------------+     Summary: RIGHT: - There is no evidence of deep vein thrombosis in the lower extremity.  - No cystic structure found in the popliteal fossa.  LEFT: - Findings consistent with age indeterminate deep vein thrombosis involving the left popliteal vein, left posterior tibial veins, and left peroneal veins. - No cystic structure found in the popliteal fossa.  *See table(s) above  for measurements and observations. Electronically signed by Harold Barban MD on 05/26/2020 at 9:28:41 PM.    Final    VAS [place] LOWER EXTREMITY VENOUS (DVT)  Result Date: 05/11/2020  Lower Venous DVT Study Indications: Edema, Recent Covid infection, and SOB.  Comparison Study: 03/31/20 negative bilateral lower extremity venous duplex Performing Technologist: Maudry Mayhew MHA, RDMS, RVT, RDCS  Examination Guidelines: A complete evaluation includes B-mode imaging, spectral Doppler, color Doppler, and power Doppler as needed of all accessible portions of each vessel. Bilateral testing is considered an integral part of a complete examination. Limited examinations for reoccurring indications may be performed as noted. The reflux portion of the exam is performed with the patient in reverse Trendelenburg.  +---------+---------------+---------+-----------+----------+--------------+ RIGHT    CompressibilityPhasicitySpontaneityPropertiesThrombus Aging +---------+---------------+---------+-----------+----------+--------------+ CFV      Full           Yes      Yes                                 +---------+---------------+---------+-----------+----------+--------------+ SFJ      Full                                                        +---------+---------------+---------+-----------+----------+--------------+ FV Prox  Full                                                        +---------+---------------+---------+-----------+----------+--------------+ FV Mid   Full                                                        +---------+---------------+---------+-----------+----------+--------------+ FV DistalFull                                                        +---------+---------------+---------+-----------+----------+--------------+  PFV      Full                                                         +---------+---------------+---------+-----------+----------+--------------+ POP      Full           Yes      Yes                                 +---------+---------------+---------+-----------+----------+--------------+ PTV      Full                                                        +---------+---------------+---------+-----------+----------+--------------+ PERO     Full                                                        +---------+---------------+---------+-----------+----------+--------------+ Gastroc  None                    No                   Acute          +---------+---------------+---------+-----------+----------+--------------+   +---------+---------------+---------+-----------+----------+--------------+ LEFT     CompressibilityPhasicitySpontaneityPropertiesThrombus Aging +---------+---------------+---------+-----------+----------+--------------+ CFV      Full           Yes      Yes                                 +---------+---------------+---------+-----------+----------+--------------+ SFJ      Full                                                        +---------+---------------+---------+-----------+----------+--------------+ FV Prox  None                    No                   Acute          +---------+---------------+---------+-----------+----------+--------------+ FV Mid   None                    No                   Acute          +---------+---------------+---------+-----------+----------+--------------+ FV DistalNone                    No                   Acute          +---------+---------------+---------+-----------+----------+--------------+ PFV      Full                                                        +---------+---------------+---------+-----------+----------+--------------+  POP      None                    No                   Acute           +---------+---------------+---------+-----------+----------+--------------+ PTV      None                    No                   Acute          +---------+---------------+---------+-----------+----------+--------------+ PERO     None                    No                   Acute          +---------+---------------+---------+-----------+----------+--------------+     Summary: RIGHT: - Findings consistent with acute deep vein thrombosis involving the right gastrocnemius veins. - No cystic structure found in the popliteal fossa.  LEFT: - Findings consistent with acute deep vein thrombosis involving the left femoral vein, left popliteal vein, left posterior tibial veins, and left peroneal veins. - No cystic structure found in the popliteal fossa.  *See table(s) above for measurements and observations. Electronically signed by Harold Barban MD on 05/11/2020 at 9:56:21 PM.    Final

## 2020-05-30 ENCOUNTER — Other Ambulatory Visit (HOSPITAL_COMMUNITY): Payer: Self-pay

## 2020-05-30 ENCOUNTER — Ambulatory Visit: Payer: Medicare Other | Admitting: Family Medicine

## 2020-05-30 ENCOUNTER — Inpatient Hospital Stay (HOSPITAL_COMMUNITY): Payer: Medicare Other

## 2020-05-30 DIAGNOSIS — A419 Sepsis, unspecified organism: Secondary | ICD-10-CM

## 2020-05-30 DIAGNOSIS — R0602 Shortness of breath: Secondary | ICD-10-CM

## 2020-05-30 LAB — COMPREHENSIVE METABOLIC PANEL
ALT: 34 U/L (ref 0–44)
AST: 28 U/L (ref 15–41)
Albumin: 2.4 g/dL — ABNORMAL LOW (ref 3.5–5.0)
Alkaline Phosphatase: 79 U/L (ref 38–126)
Anion gap: 11 (ref 5–15)
BUN: 29 mg/dL — ABNORMAL HIGH (ref 8–23)
CO2: 23 mmol/L (ref 22–32)
Calcium: 9.5 mg/dL (ref 8.9–10.3)
Chloride: 100 mmol/L (ref 98–111)
Creatinine, Ser: 0.9 mg/dL (ref 0.61–1.24)
GFR, Estimated: >60 mL/min (ref >60)
Glucose, Bld: 162 mg/dL — ABNORMAL HIGH (ref 70–99)
Potassium: 3.7 mmol/L (ref 3.5–5.1)
Sodium: 134 mmol/L — ABNORMAL LOW (ref 135–145)
Total Bilirubin: 0.5 mg/dL (ref 0.3–1.2)
Total Protein: 5 g/dL — ABNORMAL LOW (ref 6.5–8.1)

## 2020-05-30 LAB — MAGNESIUM: Magnesium: 1.9 mg/dL (ref 1.7–2.4)

## 2020-05-30 LAB — CBC
HCT: 37.3 % — ABNORMAL LOW (ref 39.0–52.0)
Hemoglobin: 12 g/dL — ABNORMAL LOW (ref 13.0–17.0)
MCH: 28.1 pg (ref 26.0–34.0)
MCHC: 32.2 g/dL (ref 30.0–36.0)
MCV: 87.4 fL (ref 80.0–100.0)
Platelets: 139 10*3/uL — ABNORMAL LOW (ref 150–400)
RBC: 4.27 MIL/uL (ref 4.22–5.81)
RDW: 19.5 % — ABNORMAL HIGH (ref 11.5–15.5)
WBC: 13.7 10*3/uL — ABNORMAL HIGH (ref 4.0–10.5)
nRBC: 0 % (ref 0.0–0.2)

## 2020-05-30 LAB — CULTURE, BLOOD (ROUTINE X 2)
Culture: NO GROWTH
Culture: NO GROWTH
Special Requests: ADEQUATE

## 2020-05-30 LAB — GLUCOSE, CAPILLARY
Glucose-Capillary: 149 mg/dL — ABNORMAL HIGH (ref 70–99)
Glucose-Capillary: 204 mg/dL — ABNORMAL HIGH (ref 70–99)
Glucose-Capillary: 267 mg/dL — ABNORMAL HIGH (ref 70–99)
Glucose-Capillary: 277 mg/dL — ABNORMAL HIGH (ref 70–99)

## 2020-05-30 LAB — C-REACTIVE PROTEIN: CRP: 1.1 mg/dL — ABNORMAL HIGH (ref <1.0)

## 2020-05-30 LAB — HEPARIN LEVEL (UNFRACTIONATED): Heparin Unfractionated: 0.62 IU/mL (ref 0.30–0.70)

## 2020-05-30 LAB — BRAIN NATRIURETIC PEPTIDE: B Natriuretic Peptide: 1922 pg/mL — ABNORMAL HIGH (ref 0.0–100.0)

## 2020-05-30 MED ORDER — ENOXAPARIN SODIUM 100 MG/ML ~~LOC~~ SOLN
90.0000 mg | Freq: Two times a day (BID) | SUBCUTANEOUS | Status: DC
  Administered 2020-05-30 – 2020-06-02 (×6): 90 mg via SUBCUTANEOUS
  Filled 2020-05-30 (×6): qty 0.9

## 2020-05-30 MED ORDER — FUROSEMIDE 10 MG/ML IJ SOLN
40.0000 mg | Freq: Once | INTRAMUSCULAR | Status: AC
  Administered 2020-05-30: 40 mg via INTRAVENOUS
  Filled 2020-05-30: qty 4

## 2020-05-30 MED ORDER — POTASSIUM CHLORIDE CRYS ER 20 MEQ PO TBCR
20.0000 meq | EXTENDED_RELEASE_TABLET | Freq: Once | ORAL | Status: AC
  Administered 2020-05-30: 20 meq via ORAL
  Filled 2020-05-30: qty 1

## 2020-05-30 NOTE — Progress Notes (Signed)
Physical Therapy Treatment Patient Details Name: NILSON TABORA MRN: [id number] DOB: [birth year] Today's Date: 05/30/2020    History of Present Illness 75 y.o. male with PMH significant for CAD with prior MI s/p PCI with DES, Stroke, COPD, acute on chronic hypoxic respiratory failure on 2L [place] at baseline, recent COVID PNA, HTN, HLD, RA (previosuly on Rituxan), BPH prior lumbar surgery with herniated nucleus pulposis, Barrett's esophageal ulceration, and dysphagia who presented to the ED with complaints of acute worsening hypoxia. CTA chest reveals progressive PE, now extending from the central right pulmonary artery into the lobar segments.    PT Comments    Pt received in supine, agreeable to therapy session with encouragement and with fair tolerance for seated/standing transfer training and supine exercises. Pt performed supine BLE A/AAROM therapeutic exercises as detailed below with fair tolerance, he would benefit from HEP handout next session to reinforce frequency/technique. Pt desats briefly with transfers on 7L [place] however SpO2 improves to >90% within 1-2 minutes of rest break with cues for deep breaths. He is resistant to idea of using RW and needs modA with bed rail support for standing transfer with HHA. Pt continues to benefit from PT services to progress toward functional mobility goals. Continue to recommend HHPT.   Follow Up Recommendations  Home health PT;Supervision/Assistance - 24 hour;Supervision for mobility/OOB (pt refusing SNF)     Equipment Recommendations  Hospital bed;Other (comment);3in1 (PT) (3in1 with elongated seat, trapeze bar for hospital bed)    Recommendations for Other Services       Precautions / Restrictions Precautions Precautions: Fall;Other (comment) Precaution Comments: watch sats Restrictions Weight Bearing Restrictions: No    Mobility  Bed Mobility Overal bed mobility: Needs Assistance Bed Mobility: Rolling;Sidelying to Sit;Sit to  Sidelying Rolling: Supervision Sidelying to sit: Min assist     Sit to sidelying: Min assist General bed mobility comments: increased time, +rail; pt able to perform posterior supine scoot toward HOB with overhead rails and min guard    Transfers Overall transfer level: Needs assistance Equipment used: 1 person hand held assist Transfers: Sit to/from Stand Sit to Stand: Mod assist         General transfer comment: minA initially progressing to [place] for steadying upon rising (pt refusing to trial RW and says "it makes me dizzy to use it")  Ambulation/Gait             General Gait Details: pt able to take single shufled step with LLE toward [facility] with bed rail support and modA HHA from PTA, deferring longer trial due to fatigue   Stairs             Wheelchair Mobility    Modified Rankin (Stroke Patients Only)       Balance Overall balance assessment: Needs assistance Sitting-balance support: Feet supported;Single extremity supported Sitting balance-Leahy Scale: Fair     Standing balance support: Bilateral upper extremity supported Standing balance-Leahy Scale: Poor Standing balance comment: reliant on UE support and external support in standing                            Cognition Arousal/Alertness: Awake/alert Behavior During Therapy: WFL for tasks assessed/performed Overall Cognitive Status: Impaired/Different from baseline Area of Impairment: Problem solving;Safety/judgement;Memory                     Memory: Decreased short-term memory   Safety/Judgement: Decreased awareness of safety;Decreased awareness of deficits  Problem Solving: Requires verbal cues;Requires tactile cues;Slow processing General Comments: flat affect, pt fear of falls limits his participation/effort with standing tasks but will participate with increased time/encouragement in functional mobility tasks      Exercises General Exercises - Lower  Extremity Ankle Circles/Pumps: AROM;Both;10 reps;Supine Quad Sets: AROM;Both;10 reps;Supine Heel Slides: AROM;Both;5 reps;Supine Hip ABduction/ADduction: AROM;Both;10 reps;Supine Straight Leg Raises: AAROM;Both;5 reps;Supine    General Comments General comments (skin integrity, edema, etc.): SpO2 desat to 86% with transition to EOB then quickly improves to 91-93% on 7L O2 [place]; upon return to supine from sitting, SpO2 briefly 84% but improves within 1-2 minutes to ~95% on 7L O2 [place]; cues for deep breaths and encouraged IS use but pt refusing, claiming pulmonologist told him to use the flutter valve; encouraged this 10x hourly.      Pertinent Vitals/Pain Pain Assessment: No/denies pain    Home Living                      Prior Function            PT Goals (current goals can now be found in the care plan section) Acute Rehab PT Goals Patient Stated Goal: Go to my daughter's graduation from [place] in June PT Goal Formulation: With patient Time For Goal Achievement: 06/10/20 Potential to Achieve Goals: Fair Progress towards PT goals: Progressing toward goals    Frequency    Min 3X/week      PT Plan Current plan remains appropriate       AM-PAC PT "6 Clicks" Mobility   Outcome Measure  Help needed turning from your back to your side while in a flat bed without using bedrails?: A Little Help needed moving from lying on your back to sitting on the side of a flat bed without using bedrails?: A Little Help needed moving to and from a bed to a chair (including a wheelchair)?: A Lot Help needed standing up from a chair using your arms (e.g., wheelchair or bedside chair)?: A Lot Help needed to walk in hospital room?: Total Help needed climbing 3-5 steps with a railing? : Total 6 Click Score: 12    End of Session Equipment Utilized During Treatment: Gait belt;Oxygen Activity Tolerance: Patient limited by fatigue Patient left: in bed;with call bell/phone within reach;with  bed alarm set Nurse Communication: Mobility status PT Visit Diagnosis: Unsteadiness on feet (R26.81);Other abnormalities of gait and mobility (R26.89);Muscle weakness (generalized) (M62.81);Difficulty in walking, not elsewhere classified (R26.2)     Time: 9735-3299 PT Time Calculation (min) (ACUTE ONLY): 38 min  Charges:  $Therapeutic Exercise: 8-22 mins $Therapeutic Activity: 23-37 mins                     Johnattan Strassman P., PTA Acute Rehabilitation Services Pager: [phone] Office: [place] 05/30/2020, 5:50 PM

## 2020-05-30 NOTE — Progress Notes (Signed)
PROGRESS NOTE                                                                                                                                                                                                             Patient Demographics:    Tyrone Roman, is a 75 y.o. male, DOB - [birth year], [id number]  Outpatient Primary MD for the patient is [name], MD   Admit date - 05/25/2020   LOS - 5  Chief Complaint  Patient presents with  . Code Sepsis       Brief Narrative: Patient is a 75 y.o. male with PMHx of RA, CAD s/p PCI, HTN, HLD, COPD, BPH-who had COVID-19 infection in January 2021-since then has had numerous hospitalizations for shortness of breath-FUO-most recently discharged from 4/4-4/13-after extensive work-up including ID evaluation-found to have PE/DVT-maintained on prednisone 30 mg p.o. twice daily (fever responsive to steroids per prior notes)-presenting back to the hospital with 4-5-day history of recurrent fever (as high as 103 F) and worsening shortness of breath.  See below for further details.  Summary of prior Hospitalizations: 1/24 - 1/26>> admit for COVID PNA- received IV steroids/remdesivir-No baricitinib/Actemra due to underling immunosuppressants. Seen with Coag-neg staph bacteremia, ID seen with no reccs for ABT's 2/10 - 2/11>> Readmitted recurrent SOB/hypoxia with substernal CP. Concern that steroid taper was completed to quickly, patient was placed on 10 day taper. 2/18 - 2/25>> Readmitted for recurrent SOB/hypoxia. COVID PCR remained positive. Negative for PE. Received zosyn/vanc with switch to PO Augmentin at d/c. Treated with short steroid taper  2/27 - 3/2>> Readmitted and treated for SOB with fever and AMS. ? Aspiration PNA and received empiric Unasyn and switched to Augmentin on d/c 4/4 - 4/13>> Readmitted from [facility] forhypoxic respiratory failure to be due to acute on chronic  congestive heart failure. COVID PCR positive.He received broad spectrum ABT which were eventually d/c due to no obvious cause.  Had FUO of unknown etiology-with concern for possible vasculities that temporary resolves with higher doses of IV steroids. D/C'd on 30mg  BID prednisone and Eliquis for new acute PE seen on CTA chest  COVID-19 vaccinated status: Vaccinated including booster ( COVID antibodies undetectable x 2 in spite of vaccine/natural infection)  Significant Events: 4/20>> Admit to [facility] for recurrent fever/shortness of breath-significant infiltrates on CT chest-worsening clot burden.  Requiring anywhere from 10-12 L of oxygen. 4/21>> MAB infusion.  Significant studie 4/21>>> Serum SARS-COV-2 antibody-IgG: Negative 4/21>>Serum Fungitell:pending 4/20>> CTA chest: PE extending from right pulmonary artery into lobar/segmental branches, significant bilateral pulmonary infiltrates. 4/21>> B/L lower ext Doppler: Left popliteal- posterior tibial-peroneal vein DVT(Previously B/L DVT on 4/6) 4/21>> Echo: EF 24-58%, RV systolic function normal. 4/13>> MPO/ANCA: Negative 4/12>> SPEP: No M spike 4/10>> RA factor: 12.9 (WNL) 4/10>> ACE: 34 (WNL) 4/9>> GOLD QuantiFERON: Negative 4/9>> CMV IgG/IgM: Negative 4/9>> EBV IgM: Negative 4/9>> ANA: Negative 4/9>> Serum cryptococcal/histoplasma/blastomycosis antigen: Negative 4/9>> Serum SARS-COV-2 antibody-IgG: Negative 4/6>> bilateral lower extremity Doppler: Bilateral lower extremity DVT 2/27>> Echo: EF 60-65%  COVID-19 medications: Remdesivir: 4/21>> IV Solu-Medrol: 4/21>> Bebtelovimab: 4/21 x 1  Antibiotics: Vancomycin: 4/20 x 1 Flagyl: 4/20>> 4/21 Cefepime: 4/20 x 1  Microbiology data: 4/21>> sputum AFB smear/culture, PCP smear and culture sensitivity: Ordered 4/20 >>blood culture: No growth 4/20>> SARS 2 coronavirus PCR: Positive with cycle threshold of 24.2 4/4>> SARS 2 coronavirus PCR: Positive with cycle threshold of  29.6 2/18>> SARS 2 coronavirus PCR: Positive with cycle threshold of 37.5  Procedures: None  Consults: ID, PCCM  DVT prophylaxis: IV heparin    Subjective:   Patient in bed, appears comfortable, denies any headache, no fever, no chest pain or pressure, much improved shortness of breath , no abdominal pain. No new focal weakness.     Assessment  & Plan :   Acute hypoxic respiratory failure: Multifactorial etiology-worsening clot burden/PE-likely ongoing/acute COVID-19 infection-superimposed significant bilateral pulmonary infiltrates (autoimmune/COVID inflammation versus opportunistic infection).  Remains on IV heparin-previously on oral prednisone as outpatient-has been escalated to IV Solu-Medrol-and has been started on Remdesivir.  Continue as needed Lasix with repeat dose of IV Lasix on 05/30/2020,.  ID/PCCM following-some mild improvement in hypoxemia compared to yesterday-Down to 7 L of HFNC on 05/30/20, and now improving.  He has been encouraged to sit up in chair at least 5 to 6 hours a day and use flutter valve for pulmonary toiletry, he has been doing that since 05/29/2020 with good improvement.   Acute vs persistent COVID-19 infection: Continues to test positive for COVID-19 since January-however last 2 tests in April-with significant cycle threshold-that continues to indicate the patient is infectious with Cycle counts < 30.  He unfortunately does not have serum antibodies against COVID in spite of being vaccinated and having a natural infection.  There is some suspicion-that ongoing fever/worsening hypoxemia/worsening clot burden could be from a new COVID infection that he may have picked up earlier this month.  He remains on steroids and Remdesivir-he was given 1 dose of monoclonal antibody on 4/21 (after significant discussion with ID/PCCM - Bebtelovimab ). Given concern for opportunistic infection-severe immunosuppression-probably will need to hold off on Actemra/baricitinib.     Bilateral pulmonary infiltrates:  Etiology of these infiltrates are clear-given significant steroid use over the past several months-history of Rituxan use (last use either late December/early Jan)-concerned that these may represent opportunistic infections-and since his fever/encephalopathy have been so steroid responsive in the past-possible that this could be autoimmune/inflammation related to RA/COVID.  Ideally needs a bronchoscopy-however given severity of hypoxemia-it cannot be performed given risk that he may end up on a ventilator.  Currently empirically being treated for COVID-19 with steroid/Remdesivir-plan is to follow closely-and initiate empiric coverage against opportunistic infections (PCP/fungal/MAI etc.) if clinical condition deteriorates.  ID and PCCM following.  Sputum for PCP/AFB/bacterial cultures get sent out along with Fungitell assay.  Pulmonary embolism/DVT: Has significant clot  burden on CT done on 4/20-in spite of being on Eliquis-lower extremity Doppler on 4/21 only shows left lower extremity DVT-a prior Doppler on 4/6 showed bilateral DVT.  Suspect that in spite of being on anticoagulation-could have had further embolic event from his legs to the lung-or he could have failed Eliquis.  Remains on IV heparin for now-once he is clinically more stable-and we are sure that he will not require any procedures-plan is to transition him to subcu Lovenox-to continue on discharge as he has likely failed DOAC therapy.  Echo on 4/20 without any RV strain.  FUO: Unclear etiology-this has been ongoing since his last admission-Per prior notes-fever-overall clinical condition has been steroid responsive-prior hospitalist MD-Dr. [facility] had spoken e with patient's primary rheumatologist-and recommendations were to keep on prednisone 30 mg p.o. twice daily.  In spite of being on prednisone-patient continues to spike fevers.  Multiple differentials for fever at this point-possible new COVID-19  infection-worsening clot burden-and more worrisome is that it could be from a opportunistic infection given significant pulmonary infiltrates.  ID following-culture data-work-up as above.  All antibiotics have been discontinued as of 4/21.  Chronic diastolic heart failure: Euvolemic on exam-await echo-we will keep in negative balance and dose with diuretics periodically.  Hyponatremia: Euvolemic on exam-could have SIADH pathophysiology-improved-continue to watch closely.  Dose with as needed doses of IV Lasix.  HTN: BP is stable-resume antihypertensives over the next few days.  HLD: Continue statin  History of rheumatoid arthritis: Has been on prednisone for the past several months-see above.  COPD: Continue inhaler regimen  GERD: Continue PPI  BPH: Continue Flomax  Debility/deconditioning: Due to acute on chronic illness-await PT/OT eval.  Previously had refused SNF.    Condition - Extremely Guarded  Family Communication  :    Cameron-Daughter-[phone]-left a voicemail on 4/21, 4/22, 05/29/20  Code Status :  Full Code  Diet :   Diet Order            Diet Carb Modified Fluid consistency: Thin; Room service appropriate? Yes  Diet effective now                  Disposition Plan  :  [facility] transfer center 05/28/20 - Dr L.Wahid, nothing else to offer agrees with management.  Status is: Inpatient  Remains inpatient appropriate because:Inpatient level of care appropriate due to severity of illness   Dispo: The patient is from: Home              Anticipated d/c is to: TBD              Patient currently is not medically stable to d/c.   Difficult to place patient No    Barriers to discharge: Hypoxia requiring O2 supplementation/complete 5 days of IV Remdesivir  Antimicorbials  :    Anti-infectives (From admission, onward)   Start     Dose/Rate Route Frequency Ordered Stop   05/27/20 1000  remdesivir 100 mg in sodium chloride 0.9 % 100 mL IVPB  Status:   Discontinued       "Followed by" Linked Group Details   100 mg 200 mL/hr over 30 Minutes Intravenous Daily 05/26/20 1134 05/26/20 1143   05/27/20 1000  remdesivir 100 mg in sodium chloride 0.9 % 100 mL IVPB       "Followed by" Linked Group Details   100 mg 200 mL/hr over 30 Minutes Intravenous Daily 05/26/20 1143 05/30/20 0956   05/26/20 1300  remdesivir 100 mg in sodium chloride  0.9 % 100 mL IVPB  Status:  Discontinued       "Followed by" Linked Group Details   100 mg 200 mL/hr over 30 Minutes Intravenous  Once 05/26/20 1134 05/26/20 1143   05/26/20 1200  remdesivir 200 mg in sodium chloride 0.9% 250 mL IVPB       "Followed by" Linked Group Details   200 mg 580 mL/hr over 30 Minutes Intravenous Once 05/26/20 1143 05/26/20 1503   05/26/20 0500  vancomycin (VANCOREADY) IVPB 1250 mg/250 mL  Status:  Discontinued        1,250 mg 166.7 mL/hr over 90 Minutes Intravenous Every 12 hours 05/25/20 1748 05/26/20 1108   05/26/20 0000  ceFEPIme (MAXIPIME) 2 g in sodium chloride 0.9 % 100 mL IVPB  Status:  Discontinued        2 g 200 mL/hr over 30 Minutes Intravenous Every 8 hours 05/25/20 1745 05/26/20 1108   05/25/20 2315  metroNIDAZOLE (FLAGYL) IVPB 500 mg  Status:  Discontinued        500 mg 100 mL/hr over 60 Minutes Intravenous Every 8 hours 05/25/20 2223 05/26/20 1108   05/25/20 1600  vancomycin (VANCOREADY) IVPB 1000 mg/200 mL  Status:  Discontinued        1,000 mg 200 mL/hr over 60 Minutes Intravenous  Once 05/25/20 1548 05/25/20 1555   05/25/20 1600  aztreonam (AZACTAM) 2 g in sodium chloride 0.9 % 100 mL IVPB  Status:  Discontinued        2 g 200 mL/hr over 30 Minutes Intravenous  Once 05/25/20 1548 05/25/20 1552   05/25/20 1600  ceFEPIme (MAXIPIME) 2 g in sodium chloride 0.9 % 100 mL IVPB  Status:  Discontinued        2 g 200 mL/hr over 30 Minutes Intravenous Every 8 hours 05/25/20 1555 05/25/20 1557   05/25/20 1600  vancomycin (VANCOREADY) IVPB 2000 mg/400 mL        2,000 mg 200  mL/hr over 120 Minutes Intravenous  Once 05/25/20 1557 05/25/20 1918   05/25/20 1600  ceFEPIme (MAXIPIME) 2 g in sodium chloride 0.9 % 100 mL IVPB        2 g 200 mL/hr over 30 Minutes Intravenous  Once 05/25/20 1557 05/25/20 1647      Inpatient Medications  Scheduled Meds: . insulin aspart  0-20 Units Subcutaneous TID WC  . insulin aspart  0-5 Units Subcutaneous QHS  . insulin aspart  6 Units Subcutaneous TID WC  . insulin glargine  40 Units Subcutaneous Daily  . methylPREDNISolone (SOLU-MEDROL) injection  40 mg Intravenous Q12H  . metoprolol tartrate  50 mg Oral BID  . mometasone-formoterol  2 puff Inhalation BID  . pantoprazole  40 mg Oral Daily  . simvastatin  20 mg Oral Daily  . tamsulosin  0.4 mg Oral Daily  . umeclidinium bromide  1 puff Inhalation Daily   Continuous Infusions: . sodium chloride    . famotidine (PEPCID) IV    . heparin 1,150 Units/hr (05/30/20 0928)   PRN Meds:.sodium chloride, acetaminophen **OR** [DISCONTINUED] acetaminophen, albuterol, diphenhydrAMINE, EPINEPHrine, famotidine (PEPCID) IV, melatonin   Time Spent in minutes  45  See all Orders from today for further details   Lala Lund M.D on 05/30/2020 at 11:21 AM  To page go to [email or web address] - use universal password  [facility] -  Office  [phone]    Objective:   Vitals:   05/29/20 1930 05/30/20 0039 05/30/20 0300 05/30/20 0746  BP: 118/88 (!) 130/91 116/80 116/89  Pulse: 81 75 69 66  Resp: 20 14 15 16   Temp: 97.9 F (36.6 C) 97.6 F (36.4 C) 98.6 F (37 C) (!) 97.3 F (36.3 C)  TempSrc: Oral Oral Rectal Oral  SpO2: 94% 100% 94% 97%  Weight:      Height:        Wt Readings from Last 3 Encounters:  05/25/20 88 kg  05/09/20 88 kg  05/05/20 88.5 kg     Intake/Output Summary (Last 24 hours) at 05/30/2020 1121 Last data filed at 05/30/2020 0800 Gross per 24 hour  Intake 1009.01 ml  Output 4500 ml  Net -3490.99 ml     Physical Exam  Awake Alert, No new F.N  deficits, Normal affect [place].AT,PERRAL Supple Neck,No JVD, No cervical lymphadenopathy appriciated.  Symmetrical Chest wall movement, Good air movement bilaterally, few rales RRR,No Gallops, Rubs or new Murmurs, No Parasternal Heave +ve B.Sounds, Abd Soft, No tenderness, No organomegaly appriciated, No rebound - guarding or rigidity. No Cyanosis, Clubbing or edema, No new Rash or bruise     Data Review:    CBC Recent Labs  Lab 05/25/20 1547 05/25/20 1626 05/26/20 0141 05/27/20 0054 05/28/20 0047 05/29/20 0608 05/30/20 0209  WBC 11.6*  --  7.9 8.0 10.4 12.8* 13.7*  HGB 11.5*   < > 10.1* 12.6* 10.7* 10.8* 12.0*  HCT 36.6*   < > 32.2* 38.1* 33.2* 33.7* 37.3*  PLT 155  --  126* 203 PLATELET CLUMPS NOTED ON SMEAR, UNABLE TO ESTIMATE 113* 139*  MCV 89.3  --  90.2 95.3 89.0 88.5 87.4  MCH 28.0  --  28.3 31.5 28.7 28.3 28.1  MCHC 31.4  --  31.4 33.1 32.2 32.0 32.2  RDW 20.5*  --  20.2* 12.0 20.0* 19.6* 19.5*  LYMPHSABS 0.2*  --   --   --   --   --   --   MONOABS 0.2  --   --   --   --   --   --   EOSABS 0.0  --   --   --   --   --   --   BASOSABS 0.0  --   --   --   --   --   --    < > = values in this interval not displayed.    Chemistries  Recent Labs  Lab 05/25/20 1547 05/25/20 1626 05/25/20 2231 05/27/20 0054 05/28/20 0047 05/28/20 0300 05/29/20 0608 05/30/20 0209  NA 129* 128*  --  133* 130*  --  133* 134*  K 4.6 4.7  --  4.8 4.7  --  4.4 3.7  CL 98 99  --  99 97*  --  99 100  CO2 21*  --   --  28 21*  --  27 23  GLUCOSE 225* 228*  --  110* 554*  --  265* 162*  BUN 21 21  --  16 24*  --  30* 29*  CREATININE 0.90 0.80  --  0.80 1.09  --  0.76 0.90  CALCIUM 9.5  --   --  8.9 9.0  --  8.9 9.5  MG  --   --  1.7  --   --  2.0 1.8 1.9  AST 29  --   --  34 29  --  24 28  ALT 27  --   --  27 24  --  28 34  ALKPHOS 76  --   --  58 82  --  71 79  BILITOT 0.8  --   --  0.8 0.3  --  0.5 0.5    ------------------------------------------------------------------------------------------------------------------ No results for input(s): CHOL, HDL, LDLCALC, TRIG, CHOLHDL, LDLDIRECT in the last 72 hours.  Lab Results  Component Value Date   HGBA1C 6.3 (H) 05/12/2020   ------------------------------------------------------------------------------------------------------------------ No results for input(s): TSH, T4TOTAL, T3FREE, THYROIDAB in the last 72 hours.  Invalid input(s): FREET3 ------------------------------------------------------------------------------------------------------------------ No results for input(s): VITAMINB12, FOLATE, FERRITIN, TIBC, IRON, RETICCTPCT in the last 72 hours.  Coagulation profile Recent Labs  Lab 05/25/20 1547  INR 1.7*    No results for input(s): DDIMER in the last 72 hours.  Cardiac Enzymes No results for input(s): CKMB, TROPONINI, MYOGLOBIN in the last 168 hours.  Invalid input(s): CK ------------------------------------------------------------------------------------------------------------------    Component Value Date/Time   BNP 1,922.0 (H) 05/30/2020 0209    Micro Results Recent Results (from the past 240 hour(s))  Resp Panel by RT-PCR (Flu A&B, Covid) Nasopharyngeal Swab     Status: Abnormal   Collection Time: 05/25/20  3:56 PM   Specimen: Nasopharyngeal Swab; Nasopharyngeal(NP) swabs in vial transport medium  Result Value Ref Range Status   SARS Coronavirus 2 by RT PCR POSITIVE (A) NEGATIVE Final    Comment: RESULT CALLED TO, READ BACK BY AND VERIFIED WITH: Modena Slater RN 2032 05/25/20 A BROWNING (NOTE) SARS-CoV-2 target nucleic acids are DETECTED.  The SARS-CoV-2 RNA is generally detectable in upper respiratory specimens during the acute phase of infection. Positive results are indicative of the presence of the identified virus, but do not rule out bacterial infection or co-infection with other pathogens not detected by  the test. Clinical correlation with patient history and other diagnostic information is necessary to determine patient infection status. The expected result is Negative.  Fact Sheet for Patients: BloggerCourse.com  Fact Sheet for Healthcare Providers: SeriousBroker.it  This test is not yet approved or cleared by the [place] FDA and  has been authorized for detection and/or diagnosis of SARS-CoV-2 by FDA under an Emergency Use Authorization (EUA).  This EUA will remain in effect (meaning this test can  be used) for the duration of  the COVID-19 declaration under Section 564(b)(1) of the Act, 21 U.S.C. section 360bbb-3(b)(1), unless the authorization is terminated or revoked sooner.     Influenza A by PCR NEGATIVE NEGATIVE Final   Influenza B by PCR NEGATIVE NEGATIVE Final    Comment: (NOTE) The Xpert Xpress SARS-CoV-2/FLU/RSV plus assay is intended as an aid in the diagnosis of influenza from Nasopharyngeal swab specimens and should not be used as a sole basis for treatment. Nasal washings and aspirates are unacceptable for Xpert Xpress SARS-CoV-2/FLU/RSV testing.  Fact Sheet for Patients: BloggerCourse.com  Fact Sheet for Healthcare Providers: SeriousBroker.it  This test is not yet approved or cleared by the [place] FDA and has been authorized for detection and/or diagnosis of SARS-CoV-2 by FDA under an Emergency Use Authorization (EUA). This EUA will remain in effect (meaning this test can be used) for the duration of the COVID-19 declaration under Section 564(b)(1) of the Act, 21 U.S.C. section 360bbb-3(b)(1), unless the authorization is terminated or revoked.  Performed at [facility], [address], [place], [place] [address]   Blood Culture (routine x 2)     Status: None   Collection Time: 05/25/20  3:59 PM   Specimen: BLOOD  Result Value Ref Range  Status   Specimen Description BLOOD SITE NOT SPECIFIED  Final   Special Requests   Final  BOTTLES DRAWN AEROBIC AND ANAEROBIC Blood Culture results may not be optimal due to an excessive volume of blood received in culture bottles   Culture   Final    NO GROWTH 5 DAYS Performed at [facility], [place] [address], [place], [place] [address]    Report Status 05/30/2020 FINAL  Final  Blood Culture (routine x 2)     Status: None   Collection Time: 05/25/20  6:18 PM   Specimen: BLOOD RIGHT HAND  Result Value Ref Range Status   Specimen Description BLOOD RIGHT HAND  Final   Special Requests   Final    BOTTLES DRAWN AEROBIC AND ANAEROBIC Blood Culture adequate volume   Culture   Final    NO GROWTH 5 DAYS Performed at [facility], [place] [address] [place], [place] [address]    Report Status 05/30/2020 FINAL  Final  Urine culture     Status: Abnormal   Collection Time: 05/25/20  7:16 PM   Specimen: In/Out Cath Urine  Result Value Ref Range Status   Specimen Description IN/OUT CATH URINE  Final   Special Requests   Final    NONE Performed at [facility], [place] [address] [place], [place] [address]    Culture 20,000 COLONIES/mL STAPHYLOCOCCUS EPIDERMIDIS (A)  Final   Report Status 05/29/2020 FINAL  Final   Organism ID, Bacteria STAPHYLOCOCCUS EPIDERMIDIS (A)  Final      Susceptibility   Staphylococcus epidermidis - MIC*    CIPROFLOXACIN >=8 RESISTANT Resistant     GENTAMICIN 4 SENSITIVE Sensitive     NITROFURANTOIN <=16 SENSITIVE Sensitive     OXACILLIN >=4 RESISTANT Resistant     TETRACYCLINE 2 SENSITIVE Sensitive     VANCOMYCIN 1 SENSITIVE Sensitive     TRIMETH/SULFA 80 RESISTANT Resistant     CLINDAMYCIN <=0.25 SENSITIVE Sensitive     RIFAMPIN <=0.5 SENSITIVE Sensitive     Inducible Clindamycin NEGATIVE Sensitive     * 20,000 COLONIES/mL STAPHYLOCOCCUS EPIDERMIDIS  Expectorated Sputum Assessment w Gram Stain, Rflx to Resp Cult     Status: None   Collection  Time: 05/27/20  1:15 PM   Specimen: Expectorated Sputum  Result Value Ref Range Status   Specimen Description EXPECTORATED SPUTUM  Final   Special Requests NONE  Final   Sputum evaluation   Final    Sputum specimen not acceptable for testing.  Please recollect.   RESULT CALLED TO, READ BACK BY AND VERIFIED WITH: A PINCHER RN 1425 05/27/20 A BROWNING Performed at [facility], [place] [address], [place], [place] [address]    Report Status 05/27/2020 FINAL  Final  Acid Fast Smear (AFB)     Status: None   Collection Time: 05/27/20  1:15 PM   Specimen: Sputum  Result Value Ref Range Status   AFB Specimen Processing Concentration  Final   Acid Fast Smear Negative  Final    Comment: (NOTE) Performed At: [facility] [place], [place] [id number] Rush Farmer MD YS:[id number]    Source (AFB) EXPECTORATED SPUTUM  Final    Comment: Performed at [facility], [place] [address], [place],  [address]  Culture, fungus without smear     Status: Abnormal (Preliminary result)   Collection Time: 05/27/20  1:15 PM   Specimen: Sputum; Other  Result Value Ref Range Status   Specimen Description EXPECTORATED SPUTUM  Final   Special Requests NONE  Final   Culture (A)  Final  CANDIDA GLABRATA CONTINUING TO HOLD Performed at [facility], [place] [address], [place], [place] [address]    Report Status PENDING  Incomplete    Radiology Reports CT Angio Chest PE W and/or Wo Contrast  Result Date: 05/25/2020 CLINICAL DATA:  75 year old male with shortness of breath and chest pain. Concern for pulmonary embolism. EXAM: CT ANGIOGRAPHY CHEST WITH CONTRAST TECHNIQUE: Multidetector CT imaging of the chest was performed using the standard protocol during bolus administration of intravenous contrast. Multiplanar CT image reconstructions and MIPs were obtained to evaluate the vascular anatomy. CONTRAST:  54mL OMNIPAQUE IOHEXOL 350 MG/ML SOLN COMPARISON:  Chest CT dated  05/14/2020. FINDINGS: Cardiovascular: Mild cardiomegaly. No pericardial effusion. There is coronary vascular calcification. Mild atherosclerotic calcification of the thoracic aorta. Linear nonocclusive thrombus noted extending from the central right pulmonary artery into the lobar and segmental branches of the right lower lobe and right upper lobe. This is new since the prior CT. No CT evidence of right heart straining. Mediastinum/Nodes: Top-normal right hilar lymph nodes. The esophagus and the thyroid gland are grossly unremarkable. No mediastinal fluid collection. Lungs/Pleura: Bilateral patchy and streaky densities similar to prior CT. Calcified pleural plaque noted in the left upper lobe anteriorly. No pleural effusion pneumothorax. The central airways are patent. Upper Abdomen: No acute abnormality. Musculoskeletal: No chest wall abnormality. No acute or significant osseous findings. Review of the MIP images confirms the above findings. IMPRESSION: 1. Pulmonary artery embolus extending from the central right pulmonary artery into the lobar and segmental branches of the right lower lobe and right upper lobe, new since the prior CT. No CT evidence of right heart straining. 2. Bilateral patchy and streaky densities similar to prior CT. 3. Aortic Atherosclerosis (ICD10-I70.0). These results were called by telephone at the time of interpretation on 05/25/2020 at 6:39 pm to provider DAVID YAO , who verbally acknowledged these results. Electronically Signed   By: Anner Crete M.D.   On: 05/25/2020 18:43   CT ANGIO CHEST PE W OR WO CONTRAST  Result Date: 05/14/2020 CLINICAL DATA:  High probability sepsis due to pneumonia EXAM: CT ANGIOGRAPHY CHEST WITH CONTRAST TECHNIQUE: Multidetector CT imaging of the chest was performed using the standard protocol during bolus administration of intravenous contrast. Multiplanar CT image reconstructions and MIPs were obtained to evaluate the vascular anatomy. CONTRAST:  29mL  OMNIPAQUE IOHEXOL 350 MG/ML SOLN COMPARISON:  04/02/2020 FINDINGS: Cardiovascular: Satisfactory opacification by motion of pulmonary artifact. Arteries although limited there is a convincing branching pulmonary artery filling defect affecting segmental vessels in the basal right lower lobe as marked on series 6. Additional peripheral emboli could be present and obscured by the degree of motion artifact. No central embolism is seen. No evidence of right heart strain. Heart size is normal. No pericardial effusion. Atherosclerosis of the coronaries. Mediastinum/Nodes: Negative for adenopathy or mass. Lungs/Pleura: Chronic lung disease with reticulation. Bilateral pleural plaque appearance without calcification. There is increased ground-glass density scattered in the bilateral lungs, greatest in the right upper lobe. No interlobular septal thickening or pleural fluid to implicate failure. Mild centrilobular emphysema. Upper Abdomen: Negative Musculoskeletal: No unexpected finding Review of the MIP images confirms the above findings. Critical Value/emergent results were called by telephone at the time of interpretation on 05/14/2020 at 8:35 am to provider Elgergawy, who verbally acknowledged these results. IMPRESSION: 1. Positive for segmental pulmonary embolism in the right lower lobe. Additional pulmonary emboli could be obscured by the degree of motion artifact. 2. Background of chronic opacities related to  recent COVID infection. There is also new ground-glass opacity especially in the right upper lobe suggesting a superimposed acute pneumonia. 3. Aortic Atherosclerosis (ICD10-I70.0).  Coronary atherosclerosis. Electronically Signed   By: Monte Fantasia M.D.   On: 05/14/2020 08:35   MR BRAIN W WO CONTRAST  Result Date: 05/16/2020 CLINICAL DATA:  Delirium; fever with unknown origin, altered mental status, evaluate for encephalitis. EXAM: MRI HEAD WITHOUT AND WITH CONTRAST TECHNIQUE: Multiplanar, multiecho pulse  sequences of the brain and surrounding structures were obtained without and with intravenous contrast. CONTRAST:  67mL GADAVIST GADOBUTROL 1 MMOL/ML IV SOLN COMPARISON:  Prior head CT examinations 04/02/2020 and earlier. Brain MRI 12/13/2017. FINDINGS: Brain: Mild cerebral and cerebellar atrophy. AP punctate focus of restricted diffusion is questioned within the right parietal lobe (versus artifact) (series 3, image 33). Additionally, there is apparent subtle restricted diffusion along the midline callosal splenium, slightly eccentric to the left. A few small foci of T2/FLAIR hyperintensity within the bilateral cerebral white matter and right thalamus are nonspecific, but likely reflect minimal chronic small vessel ischemic disease. Redemonstrated left retrocerebellar arachnoid cyst. There is no acute infarct. No evidence of intracranial mass. No chronic intracranial blood products. No extra-axial fluid collection. No midline shift. No abnormal intracranial enhancement. Vascular: Expected proximal arterial flow voids. Skull and upper cervical spine: No focal marrow lesion. Incompletely assessed cervical spondylosis. Suspected C4-C5 vertebral body ankylosis. Sinuses/Orbits: Visualized orbits show no acute finding. Bilateral lens replacements. Mild paranasal sinus mucosal thickening, most notably within the bilateral ethmoid air cells. Small left maxillary sinus mucous retention cyst. Other: Right mastoid effusion. IMPRESSION: Punctate acute infarct versus artifact within the right parietal lobe. Additionally, there is apparent subtle restricted diffusion within the midline callosal splenium, slightly eccentric to the left. This may reflect a small focus of acute ischemia or artifact. Alternatively, this may reflect a cytotoxic lesion of the corpus callosum (which has a broad range of potential etiologies and clinical associations), although the appearance would be atypical for this entity. Background mild parenchymal  atrophy and chronic small vessel ischemic disease. Mild paranasal sinus disease as described. Right mastoid effusion. Electronically Signed   By: Kellie Simmering DO   On: 05/16/2020 18:51   MR CERVICAL SPINE W WO CONTRAST  Result Date: 05/17/2020 CLINICAL DATA:  Initial screening for possible epidural abscess. EXAM: MRI CERVICAL SPINE WITHOUT AND WITH CONTRAST TECHNIQUE: Multiplanar and multiecho pulse sequences of the cervical spine, to include the craniocervical junction and cervicothoracic junction, were obtained without and with intravenous contrast. CONTRAST:  25mL GADAVIST GADOBUTROL 1 MMOL/ML IV SOLN COMPARISON:  Prior MRI from 03/24/2010. FINDINGS: Alignment: Reversal of the normal cervical lordosis with apex at C3-4. Trace anterolisthesis of C3 on C4 and C7 on T1, chronic and facet mediated. Vertebrae: Vertebral body height maintained without acute or chronic fracture. C4 and C5 vertebral bodies are largely ankylosed, likely degenerative. Bone marrow signal intensity within normal limits. No discrete or worrisome osseous lesions. No abnormal marrow edema or enhancement. No findings to suggest osteomyelitis discitis or septic arthritis. Cord: Normal signal and morphology. No epidural abscess or other collection. Posterior Fossa, vertebral arteries, paraspinal tissues: Probable benign retro cerebellar arachnoid cyst noted. Visualized brain and posterior fossa otherwise unremarkable. Craniocervical junction within normal limits. Paraspinous and prevertebral soft tissues normal. Normal flow voids seen within the vertebral arteries bilaterally. Disc levels: C2-C3: Negative interspace. Right worse than left facet hypertrophy. No spinal stenosis. Mild right C3 foraminal narrowing. Left neural foramina remains patent. C3-C4: Broad-based posterior disc osteophyte flattens  and effaces the ventral thecal sac. Mild flattening of the ventral cord without cord signal changes. Mild spinal stenosis. Superimposed bilateral  facet hypertrophy with left greater than right uncovertebral spurring. Severe left with moderate right C4 foraminal stenosis. C4-C5: C4 and C5 vertebral bodies are largely ankylosed. Broad posterior endplate osseous ridging flattens and effaces the ventral thecal sac. Mild spinal stenosis without cord impingement. Moderate right worse than left C5 foraminal stenosis. C5-C6: Degenerative intervertebral disc space narrowing with diffuse disc osteophyte complex. Mild flattening of the ventral thecal sac without significant spinal stenosis. Moderate left worse than right C6 foraminal narrowing. C6-C7: Degenerative intervertebral disc space narrowing with diffuse disc osteophyte complex. No significant spinal stenosis. Severe left with moderate right C7 foraminal stenosis. C7-T1: Trace anterolisthesis. No significant disc bulge. Moderate bilateral facet and ligament flavum hypertrophy. No significant spinal stenosis. Foramina remain patent. IMPRESSION: 1. No evidence for osteomyelitis discitis or septic arthritis within the cervical spine. No epidural abscess or other infection. 2. Multilevel cervical spondylosis with resultant mild spinal stenosis at C3-4 and C4-5. 3. Multifactorial degenerative changes with resultant multilevel foraminal narrowing as above. Notable findings include severe left with moderate right C4 foraminal stenosis, moderate bilateral C5 and C6 foraminal narrowing, with severe left and moderate right C7 foraminal stenosis. Electronically Signed   By: Jeannine Boga M.D.   On: 05/17/2020 21:40   MR THORACIC SPINE W WO CONTRAST  Result Date: 05/17/2020 CLINICAL DATA:  Initial screening for possible infection, epidural abscess. EXAM: MRI THORACIC WITHOUT AND WITH CONTRAST TECHNIQUE: Multiplanar and multiecho pulse sequences of the thoracic spine were obtained without and with intravenous contrast. CONTRAST:  59mL GADAVIST GADOBUTROL 1 MMOL/ML IV SOLN COMPARISON:  None available. FINDINGS:  Alignment: Straightening of the normal thoracic kyphosis. No listhesis. Vertebrae: Vertebral body height maintained without acute or chronic fracture. Bone marrow signal intensity within normal limits. Few scattered benign hemangiomata noted. No worrisome osseous lesions. No abnormal marrow edema or enhancement. No findings to suggest osteomyelitis discitis or septic arthritis. Cord: Normal signal and morphology. No epidural abscess or other collection. No abnormal enhancement. Paraspinal and other soft tissues: Paraspinous soft tissues demonstrate no acute finding. Small layering bilateral pleural effusions noted, left slightly larger than right. T2 hyperintense benign appearing cyst partially visualized extending from the right kidney. Visualized visceral structures otherwise grossly unremarkable. Disc levels: T1-2:  Unremarkable. T2-3: Tiny right paracentral disc extrusion with superior migration. Mild right-sided facet hypertrophy. No spinal stenosis. Foramina remain patent. T3-4: Minimal disc bulge. No spinal stenosis. Foramina remain patent. T4-5: Normal interspace. Mild right-sided facet hypertrophy. No stenosis. T5-6:  Normal interspace.  Mild facet hypertrophy.  No stenosis. T6-7:  Unremarkable. T7-8: Mild degenerative intervertebral disc space narrowing with disc bulge. No spinal stenosis. Foramina remain patent. T8-9: Mild disc bulge. No spinal stenosis. Foramina remain patent. T9-10: Mild disc bulge with posterior element hypertrophy. No significant spinal stenosis. Mild bilateral foraminal narrowing. T10-11: Diffuse disc bulge, asymmetric to the right. Moderate facet and ligament flavum hypertrophy. Resultant mild to moderate spinal stenosis with mild cord flattening. No cord signal changes. Moderate bilateral foraminal narrowing. T11-12: Negative interspace. Bilateral facet hypertrophy. No significant spinal stenosis. Mild bilateral foraminal narrowing. T12-L1: Disc desiccation with small endplate  Schmorl's node deformities. No stenosis. IMPRESSION: 1. No MRI evidence for osteomyelitis discitis or septic arthritis within the thoracic spine. No epidural abscess or other infection. 2. Disc bulging with facet hypertrophy at T10-11 with resultant mild to moderate spinal stenosis and moderate bilateral foraminal narrowing. 3. Additional mild  spondylosis elsewhere within the thoracic spine without significant stenosis. 4. Small layering bilateral pleural effusions, left slightly larger than right. Electronically Signed   By: Jeannine Boga M.D.   On: 05/17/2020 21:52   MR Lumbar Spine W Wo Contrast  Result Date: 05/13/2020 CLINICAL DATA:  Fever, lumbar spine tenderness EXAM: MRI LUMBAR SPINE WITHOUT AND WITH CONTRAST TECHNIQUE: Multiplanar and multiecho pulse sequences of the lumbar spine were obtained without and with intravenous contrast. CONTRAST:  29mL GADAVIST GADOBUTROL 1 MMOL/ML IV SOLN COMPARISON:  04/03/2018 FINDINGS: Segmentation:  Standard. Alignment:  Stable. Vertebrae: There is now fusion across the L1-L2 disc space. There is no new marrow edema or enhancement. Multilevel degenerative plate irregularity and Schmorl's nodes. Conus medullaris and cauda equina: Conus extends to the L1-L2 level. Conus and cauda equina appear normal. No intrathecal enhancement. Paraspinal and other soft tissues: Mild lower lumbar paraspinal edema. No evidence of collection. Disc levels: L1-L2: Prior right laminectomy. Stable retrolisthesis with bridging endplate osteophytes effacing the ventral thecal sac. Similar mild to moderate foraminal stenosis. L2-L3: Disc bulge. Facet arthropathy with ligamentum flavum infolding. Slightly increased mild canal stenosis. Similar mild foraminal stenosis. L3-L4: Disc bulge. Facet arthropathy with ligamentum flavum infolding. Similar moderate canal stenosis with effacement of subarticular recesses. Similar mild to moderate left greater than right foraminal stenosis. L4-L5: Disc  bulge with superimposed left subarticular/foraminal protrusion. Facet arthropathy with ligamentum flavum infolding. Similar mild canal stenosis. Similar partial effacement of the left subarticular recess. Similar mild foraminal stenosis. L5-S1: Disc bulge with endplate osteophytic ridging. Facet arthropathy. No canal stenosis. Similar mild foraminal stenosis. IMPRESSION: No evidence of discitis/osteomyelitis. Multilevel degenerative changes as detailed above without substantial change since the prior examination. Electronically Signed   By: Macy Mis M.D.   On: 05/13/2020 14:25   CT ABDOMEN PELVIS W CONTRAST  Result Date: 05/15/2020 CLINICAL DATA:  Sepsis.  Fever of unknown origin. EXAM: CT ABDOMEN AND PELVIS WITH CONTRAST TECHNIQUE: Multidetector CT imaging of the abdomen and pelvis was performed using the standard protocol following bolus administration of intravenous contrast. CONTRAST:  174mL OMNIPAQUE IOHEXOL 300 MG/ML  SOLN COMPARISON:  04/02/2020.  Chest CTA obtained earlier today. FINDINGS: Lower chest: Progressive pulmonary embolism on the right, with emboli currently demonstrated in the distal right main pulmonary artery and significantly more clot in the intersegmental and proximal right lower lobe pulmonary arteries. Bilateral lung densities and atelectasis with improvement since this morning. Hepatobiliary: Diffuse low density of the liver relative to the spleen. Normal appearing gallbladder. Pancreas: Unremarkable. No pancreatic ductal dilatation or surrounding inflammatory changes. Spleen: Normal in size without focal abnormality. Adrenals/Urinary Tract: Normal appearing adrenal glands. Bilateral renal cysts. Multiple small bilateral renal calculi. No bladder or ureteral calculi are seen. Excreted contrast in the urinary bladder from the CTA earlier today. Stomach/Bowel: Multiple colonic diverticula without evidence of diverticulitis. Unremarkable stomach, small bowel and appendix.  Vascular/Lymphatic: Atheromatous arterial calcifications without aneurysm. No enlarged lymph nodes. Two renal arteries on the left. Reproductive: Mildly enlarged prostate gland. Other: Small bilateral inguinal hernias containing fat and very small umbilical artery containing fat. Musculoskeletal: Stable lumbar and lower thoracic spine degenerative changes and fusion of the L1 and L2 vertebral bodies. IMPRESSION: 1. Progressive pulmonary embolism on the right, with emboli currently demonstrated in the distal right main pulmonary artery and significantly more clot in the intersegmental and proximal right lower lobe pulmonary arteries. These results will be called to the ordering clinician or representative by the Radiologist Assistant, and communication documented in the PACS or  [facility] Dashboard. 2. Lung densities and atelectasis with improvement since this morning. 3. Diffuse hepatic steatosis. 4. Multiple small, nonobstructing bilateral renal calculi. 5. Colonic diverticulosis. Electronically Signed   By: Claudie Revering M.D.   On: 05/15/2020 22:17   VAS [place] IVC/ILIAC (VENOUS ONLY)  Result Date: 05/11/2020 IVC/ILIAC STUDY Indications: DVT, recent COVID Limitations: Air/bowel gas.  Comparison Study: No prior study Performing Technologist: Maudry Mayhew MHA, RDMS, RVT, RDCS  Examination Guidelines: A complete evaluation includes B-mode imaging, spectral Doppler, color Doppler, and power Doppler as needed of all accessible portions of each vessel. Bilateral testing is considered an integral part of a complete examination. Limited examinations for reoccurring indications may be performed as noted.  IVC/Iliac Findings: +----------+------+--------+--------+    IVC    PatentThrombusComments +----------+------+--------+--------+ IVC Mid   patent                 +----------+------+--------+--------+ IVC Distalpatent                 +----------+------+--------+--------+   +-------------------+---------+-----------+---------+-----------+--------+         CIV        RT-PatentRT-ThrombusLT-PatentLT-ThrombusComments +-------------------+---------+-----------+---------+-----------+--------+ Common Iliac Prox                       patent                      +-------------------+---------+-----------+---------+-----------+--------+ Common Iliac Mid                        patent                      +-------------------+---------+-----------+---------+-----------+--------+ Common Iliac Distal patent              patent                      +-------------------+---------+-----------+---------+-----------+--------+  +-------------------------+---------+-----------+---------+-----------+--------+            EIV           RT-PatentRT-ThrombusLT-PatentLT-ThrombusComments +-------------------------+---------+-----------+---------+-----------+--------+ External Iliac Vein Prox                      patent                      +-------------------------+---------+-----------+---------+-----------+--------+ External Iliac Vein       patent                                          Distal                                                                    +-------------------------+---------+-----------+---------+-----------+--------+   Summary: IVC/Iliac: No evidence of thrombus in IVC and Iliac veins.  *See table(s) above for measurements and observations.  Electronically signed by Harold Barban MD on 05/11/2020 at 9:55:50 PM.    Final    DG Chest Port 1 View  Result Date: 05/30/2020 CLINICAL DATA:  Difficulty breathing, low O2 sats, history of COVID infection. EXAM: PORTABLE CHEST 1 VIEW COMPARISON:  05/29/2020 and CT chest 05/25/2020. FINDINGS:  Heart size stable. Patchy mixed interstitial and airspace opacification bilaterally, increased from 05/29/2020. No pleural fluid. IMPRESSION: Worsening COVID-19 pneumonia. Electronically Signed   By:  Lorin Picket M.D.   On: 05/30/2020 09:11   DG Chest Port 1 View  Result Date: 05/29/2020 CLINICAL DATA:  Shortness of breath.  Covid-19 viral infection. EXAM: PORTABLE CHEST 1 VIEW COMPARISON:  05/28/2020 FINDINGS: Heart size remains within normal limits. Diffuse interstitial infiltrates are again seen with mild patchy airspace opacities in the left lung base. These show no significant change since previous study. No evidence of pleural effusion. IMPRESSION: No significant change in diffuse interstitial infiltrates with mild patchy airspace disease in left lower lung. Electronically Signed   By: Marlaine Hind M.D.   On: 05/29/2020 08:36   DG Chest Port 1 View  Result Date: 05/28/2020 CLINICAL DATA:  Shortness of breath EXAM: PORTABLE CHEST 1 VIEW COMPARISON:  Three days ago FINDINGS: Patchy bilateral pulmonary infiltrate without convincing progression. Lung volumes are low. No visible effusion or air leak. Normal heart size and stable mediastinal contours. IMPRESSION: Similar degree of bilateral pulmonary infiltrates. No new abnormality. Electronically Signed   By: Monte Fantasia M.D.   On: 05/28/2020 07:38   DG Chest Port 1 View  Result Date: 05/25/2020 CLINICAL DATA:  75 year old male with concern for sepsis. EXAM: PORTABLE CHEST 1 VIEW COMPARISON:  Chest CT dated 05/14/2020. FINDINGS: Diffuse bilateral streaky interstitial densities worsened since 05/09/2020 but relatively similar to 05/10/2020 concerning for residual infiltrate, likely on the background of chronic changes. Clinical correlation and follow-up recommended. No lobar consolidation, pleural effusion, pneumothorax. The cardiac silhouette is within limits. No acute osseous pathology. IMPRESSION: Persistent bilateral infiltrates. Electronically Signed   By: Anner Crete M.D.   On: 05/25/2020 16:24   DG Chest Port 1 View  Result Date: 05/10/2020 CLINICAL DATA:  Short of breath, history of COPD EXAM: PORTABLE CHEST 1 VIEW COMPARISON:   Prior chest x-ray dated yesterday FINDINGS: Stable cardiac and mediastinal contours. Marked interval increase in diffuse interstitial prominence bilaterally now with Kerley B-lines in the periphery. Findings are most consistent with worsening CHF. No large effusion or pneumothorax. No acute osseous abnormality. IMPRESSION: Increasing interstitial pulmonary edema concerning for progressive CHF. Electronically Signed   By: Jacqulynn Cadet M.D.   On: 05/10/2020 07:54   DG Chest Port 1 View  Result Date: 05/09/2020 CLINICAL DATA:  Possible sepsis Fever and cough EXAM: PORTABLE CHEST 1 VIEW COMPARISON:  04/04/2020 FINDINGS: Heart size within normal limits. No pulmonary vascular congestion. Interval improvement in aeration of the lungs with bilateral airspace opacities still remaining. IMPRESSION: Interval improvement in aeration of the lungs with bilateral opacities still remaining consistent with resolving pneumonia. Electronically Signed   By: Miachel Roux M.D.   On: 05/09/2020 12:56   ECHOCARDIOGRAM COMPLETE  Result Date: 05/26/2020    ECHOCARDIOGRAM REPORT   Patient Name:   Tyrone Roman Date of Exam: 05/26/2020 Medical Rec #:  [id number]      Height:       74.0 in Accession #:    [id number]     Weight:       194.0 lb Date of Birth:  November 14, 1945      BSA:          2.146 m Patient Age:    52 years       BP:           129/92 mmHg Patient Gender: M  HR:           84 bpm. Exam Location:  Inpatient Procedure: 2D Echo, Cardiac Doppler, Color Doppler and Intracardiac            Opacification Agent Indications:    Pulmonary Embolus I26.09  History:        Patient has prior history of Echocardiogram examinations, most                 recent 04/03/2020. CAD and Previous Myocardial Infarction, COPD,                 Signs/Symptoms:Shortness of Breath; Risk Factors:Hypertension                 and Dyslipidemia. COVID 19. Sepsis, due to unspecified organism,                 unspecified whether acute organ  dysfunction present                 Multiple subsegmental pulmonary emboli without acute cor                 pulmonale.  Sonographer:    Darlina Sicilian RDCS Referring Phys: [id number] [place]  1. Left ventricular ejection fraction, by estimation, is 50 to 55%. The left ventricle has low normal function. The left ventricle has no regional wall motion abnormalities. There is severe asymmetric left ventricular hypertrophy of the basal and septal  segments. Left ventricular diastolic parameters are consistent with Grade II diastolic dysfunction (pseudonormalization). Elevated left ventricular end-diastolic pressure.  2. Right ventricular systolic function is normal. The right ventricular size is normal.  3. The mitral valve is abnormal. No evidence of mitral valve regurgitation. No evidence of mitral stenosis.  4. The aortic valve is tricuspid. There is mild calcification of the aortic valve. Aortic valve regurgitation is not visualized. Mild aortic valve sclerosis is present, with no evidence of aortic valve stenosis.  5. Aortic dilatation noted. There is mild dilatation of the aortic root, measuring 41 mm.  6. The inferior vena cava is normal in size with greater than 50% respiratory variability, suggesting right atrial pressure of 3 mmHg. FINDINGS  Left Ventricle: Left ventricular ejection fraction, by estimation, is 50 to 55%. The left ventricle has low normal function. The left ventricle has no regional wall motion abnormalities. Definity contrast agent was given IV to delineate the left ventricular endocardial borders. The left ventricular internal cavity size was normal in size. There is severe asymmetric left ventricular hypertrophy of the basal and septal segments. Left ventricular diastolic parameters are consistent with Grade II diastolic dysfunction (pseudonormalization). Elevated left ventricular end-diastolic pressure. Right Ventricle: The right ventricular size is normal. No increase in  right ventricular wall thickness. Right ventricular systolic function is normal. Left Atrium: Left atrial size was normal in size. Right Atrium: Right atrial size was normal in size. Pericardium: There is no evidence of pericardial effusion. Mitral Valve: The mitral valve is abnormal. There is mild thickening of the mitral valve leaflet(s). There is mild calcification of the mitral valve leaflet(s). Mild mitral annular calcification. No evidence of mitral valve regurgitation. No evidence of mitral valve stenosis. Tricuspid Valve: The tricuspid valve is normal in structure. Tricuspid valve regurgitation is not demonstrated. No evidence of tricuspid stenosis. Aortic Valve: The aortic valve is tricuspid. There is mild calcification of the aortic valve. Aortic valve regurgitation is not visualized. Mild aortic valve sclerosis is present, with no evidence of aortic valve stenosis.  Pulmonic Valve: The pulmonic valve was normal in structure. Pulmonic valve regurgitation is not visualized. No evidence of pulmonic stenosis. Aorta: The aortic root is normal in size and structure and aortic dilatation noted. There is mild dilatation of the aortic root, measuring 41 mm. Venous: The inferior vena cava is normal in size with greater than 50% respiratory variability, suggesting right atrial pressure of 3 mmHg. IAS/Shunts: No atrial level shunt detected by color flow Doppler.  LEFT VENTRICLE PLAX 2D LVIDd:         3.20 cm     Diastology LVIDs:         2.60 cm     LV e' medial:    3.15 cm/s LV PW:         1.40 cm     LV E/e' medial:  17.1 LV IVS:        2.00 cm     LV e' lateral:   3.37 cm/s LVOT diam:     2.00 cm     LV E/e' lateral: 16.0 LV SV:         41 LV SV Index:   19 LVOT Area:     3.14 cm  LV Volumes (MOD) LV vol d, MOD A2C: 91.2 ml LV vol d, MOD A4C: 78.4 ml LV vol s, MOD A2C: 43.8 ml LV vol s, MOD A4C: 36.8 ml LV SV MOD A2C:     47.4 ml LV SV MOD A4C:     78.4 ml LV SV MOD BP:      44.4 ml RIGHT VENTRICLE RV S prime:      8.16 cm/s LEFT ATRIUM             Index       RIGHT ATRIUM          Index LA diam:        3.00 cm 1.40 cm/m  RA Area:     6.38 cm LA Vol (A2C):   20.1 ml 9.37 ml/m  RA Volume:   7.69 ml  3.58 ml/m LA Vol (A4C):   21.3 ml 9.93 ml/m LA Biplane Vol: 21.6 ml 10.07 ml/m  AORTIC VALVE LVOT Vmax:   80.50 cm/s LVOT Vmean:  60.800 cm/s LVOT VTI:    0.130 m  AORTA Ao Root diam: 4.10 cm Ao Asc diam:  3.40 cm MITRAL VALVE MV Area (PHT): 3.01 cm    SHUNTS MV Decel Time: 252 msec    Systemic VTI:  0.13 m MV E velocity: 53.80 cm/s  Systemic Diam: 2.00 cm MV A velocity: 81.50 cm/s MV E/A ratio:  0.66 Jenkins Rouge MD Electronically signed by Jenkins Rouge MD Signature Date/Time: 05/26/2020/2:30:51 PM    Final    VAS [place] LOWER EXTREMITY VENOUS (DVT)  Result Date: 05/26/2020  Lower Venous DVT Study Indications: Edema, and Swelling.  Comparison Study: 05/11/20 previous Performing Technologist: Abram Sander RVS  Examination Guidelines: A complete evaluation includes B-mode imaging, spectral Doppler, color Doppler, and power Doppler as needed of all accessible portions of each vessel. Bilateral testing is considered an integral part of a complete examination. Limited examinations for reoccurring indications may be performed as noted. The reflux portion of the exam is performed with the patient in reverse Trendelenburg.  +---------+---------------+---------+-----------+----------+--------------+ RIGHT    CompressibilityPhasicitySpontaneityPropertiesThrombus Aging +---------+---------------+---------+-----------+----------+--------------+ CFV      Full           Yes      Yes                                 +---------+---------------+---------+-----------+----------+--------------+  SFJ      Full                                                        +---------+---------------+---------+-----------+----------+--------------+ FV Prox  Full                                                         +---------+---------------+---------+-----------+----------+--------------+ FV Mid   Full                                                        +---------+---------------+---------+-----------+----------+--------------+ FV DistalFull                                                        +---------+---------------+---------+-----------+----------+--------------+ PFV      Full                                                        +---------+---------------+---------+-----------+----------+--------------+ POP      Full           Yes      Yes                                 +---------+---------------+---------+-----------+----------+--------------+ PTV      Full                                                        +---------+---------------+---------+-----------+----------+--------------+ PERO     Full                                                        +---------+---------------+---------+-----------+----------+--------------+   +---------+---------------+---------+-----------+----------+-----------------+ LEFT     CompressibilityPhasicitySpontaneityPropertiesThrombus Aging    +---------+---------------+---------+-----------+----------+-----------------+ CFV      Full           Yes      Yes                                    +---------+---------------+---------+-----------+----------+-----------------+ SFJ      Full                                                           +---------+---------------+---------+-----------+----------+-----------------+  FV Prox  Full                                                           +---------+---------------+---------+-----------+----------+-----------------+ FV Mid   Full                                                           +---------+---------------+---------+-----------+----------+-----------------+ FV DistalFull                                                            +---------+---------------+---------+-----------+----------+-----------------+ PFV      Full                                                           +---------+---------------+---------+-----------+----------+-----------------+ POP      None           No       No                   Age Indeterminate +---------+---------------+---------+-----------+----------+-----------------+ PTV      None                                         Age Indeterminate +---------+---------------+---------+-----------+----------+-----------------+ PERO     None                                         Age Indeterminate +---------+---------------+---------+-----------+----------+-----------------+     Summary: RIGHT: - There is no evidence of deep vein thrombosis in the lower extremity.  - No cystic structure found in the popliteal fossa.  LEFT: - Findings consistent with age indeterminate deep vein thrombosis involving the left popliteal vein, left posterior tibial veins, and left peroneal veins. - No cystic structure found in the popliteal fossa.  *See table(s) above for measurements and observations. Electronically signed by Harold Barban MD on 05/26/2020 at 9:28:41 PM.    Final    VAS [place] LOWER EXTREMITY VENOUS (DVT)  Result Date: 05/11/2020  Lower Venous DVT Study Indications: Edema, Recent Covid infection, and SOB.  Comparison Study: 03/31/20 negative bilateral lower extremity venous duplex Performing Technologist: Maudry Mayhew MHA, RDMS, RVT, RDCS  Examination Guidelines: A complete evaluation includes B-mode imaging, spectral Doppler, color Doppler, and power Doppler as needed of all accessible portions of each vessel. Bilateral testing is considered an integral part of a complete examination. Limited examinations for reoccurring indications may be performed as noted. The reflux portion of the exam is performed with the patient in reverse Trendelenburg.   +---------+---------------+---------+-----------+----------+--------------+ RIGHT    CompressibilityPhasicitySpontaneityPropertiesThrombus Aging +---------+---------------+---------+-----------+----------+--------------+ CFV  Full           Yes      Yes                                 +---------+---------------+---------+-----------+----------+--------------+ SFJ      Full                                                        +---------+---------------+---------+-----------+----------+--------------+ FV Prox  Full                                                        +---------+---------------+---------+-----------+----------+--------------+ FV Mid   Full                                                        +---------+---------------+---------+-----------+----------+--------------+ FV DistalFull                                                        +---------+---------------+---------+-----------+----------+--------------+ PFV      Full                                                        +---------+---------------+---------+-----------+----------+--------------+ POP      Full           Yes      Yes                                 +---------+---------------+---------+-----------+----------+--------------+ PTV      Full                                                        +---------+---------------+---------+-----------+----------+--------------+ PERO     Full                                                        +---------+---------------+---------+-----------+----------+--------------+ Gastroc  None                    No                   Acute          +---------+---------------+---------+-----------+----------+--------------+   +---------+---------------+---------+-----------+----------+--------------+ LEFT     CompressibilityPhasicitySpontaneityPropertiesThrombus Aging  +---------+---------------+---------+-----------+----------+--------------+  CFV      Full           Yes      Yes                                 +---------+---------------+---------+-----------+----------+--------------+ SFJ      Full                                                        +---------+---------------+---------+-----------+----------+--------------+ FV Prox  None                    No                   Acute          +---------+---------------+---------+-----------+----------+--------------+ FV Mid   None                    No                   Acute          +---------+---------------+---------+-----------+----------+--------------+ FV DistalNone                    No                   Acute          +---------+---------------+---------+-----------+----------+--------------+ PFV      Full                                                        +---------+---------------+---------+-----------+----------+--------------+ POP      None                    No                   Acute          +---------+---------------+---------+-----------+----------+--------------+ PTV      None                    No                   Acute          +---------+---------------+---------+-----------+----------+--------------+ PERO     None                    No                   Acute          +---------+---------------+---------+-----------+----------+--------------+     Summary: RIGHT: - Findings consistent with acute deep vein thrombosis involving the right gastrocnemius veins. - No cystic structure found in the popliteal fossa.  LEFT: - Findings consistent with acute deep vein thrombosis involving the left femoral vein, left popliteal vein, left posterior tibial veins, and left peroneal veins. - No cystic structure found in the popliteal fossa.  *See table(s) above for measurements and observations. Electronically signed by Harold Barban MD on 05/11/2020 at  9:56:21 PM.    Final

## 2020-05-30 NOTE — Progress Notes (Signed)
[facility]  Date of Admission:  05/25/2020      Total days of antibiotics    ASSESSMENT: Tyrone Roman is a 75 y.o. male with respiratory failure in the setting of what seems to be acute COVID pneumonia (2nd illness).  Other differential to consider could be vasculitis that is partially treated with steroid courses (although he has been taking his prednisone 30 mg BID since last discharge). Atypical infection studies pending including serum fungitel, fungal/routine cultures and PJP stain (was not on prophylaxis from what I can tell).   He seems to be improving with standard COVID treatment. PCCM following and holding off on bronch for now.   COVID, recurrent, no serologic protection = s/p Bebtelovimab monoclonal antibody during acute severe illness in addition to standard of care.  He would be a great candidate for Evusheld monoclonal antibodies after he recovers from acute infection for future pre-exposure COVID prophylaxis. Given he is immunosuppressed and higher risk for prolonged viral shedding, would allow the 21 days prior to considering giving the injections (eligible May 11). Could consider IVIG if he deteriorates for immune-bolstering effect; however with current PE history (and possible failure) would be cautious to use given increased r/t thrombosis. He seems to be improving at this point so likely not needed presently.   PE following COVID PNA = progressive PE on repeat CTA 4/20, possible failure on Eliquis; enoxaparin preferred per PCCM team.    A/C CHF, BNP ~1900 = diuresis per primary team.     PLAN: 1. Complete remdesivir + steroids as outlined  2. Follow pending atypical infection studies 3. Diuresis per primary team 4. Enoxaparin for PEs per primary team.  5. Would consider Evusheld after recovery from acute COVID infection (May 11th)     Principal Problem:   COVID-19 Active Problems:   Rheumatoid arthritis ([name])   Acute  hypoxemic respiratory failure (HCC)   Pulmonary embolus (H[facility])   SIRS (systemic inflammatory response syndrome) (HCC)   Elevated troponin   . insulin aspart  0-20 Units Subcutaneous TID WC  . insulin aspart  0-5 Units Subcutaneous QHS  . insulin aspart  6 Units Subcutaneous TID WC  . insulin glargine  40 Units Subcutaneous Daily  . methylPREDNISolone (SOLU-MEDROL) injection  40 mg Intravenous Q12H  . metoprolol tartrate  50 mg Oral BID  . mometasone-formoterol  2 puff Inhalation BID  . pantoprazole  40 mg Oral Daily  . simvastatin  20 mg Oral Daily  . tamsulosin  0.4 mg Oral Daily  . umeclidinium bromide  1 puff Inhalation Daily    SUBJECTIVE: His daughter, Tyrone Roman is in the room today. He states he is doing better than when he came in the hospital this time. Oxygen has been turned down to 5 LPM (chronically on 2L [place] intermittently prior to [place] in January).    He is upset that Rituxan may be contributing as to why he has no protection from [place].    Review of Systems: Review of Systems  Constitutional: Positive for malaise/fatigue. Negative for chills, fever and weight loss.  HENT: Negative for sore throat.   Eyes: Negative for blurred vision and double vision.  Respiratory: Positive for cough and shortness of breath. Negative for sputum production.   Cardiovascular: Negative for chest pain, orthopnea and leg swelling.  Gastrointestinal: Negative for abdominal pain, diarrhea and vomiting.  Genitourinary: Negative for dysuria and flank pain.  Musculoskeletal: Negative for joint pain, myalgias  and neck pain.  Skin: Negative for rash.  Neurological: Negative for dizziness, tingling and headaches.  Psychiatric/Behavioral: Negative for depression and substance abuse. The patient is not nervous/anxious and does not have insomnia.     Allergies  Allergen Reactions  . Cephalexin Shortness Of Breath, Rash and Other (See Comments)    Tolerated Augmentin 2015 and 2017 (12-2017).  Tolerated nafcillin 12/2017 PATIENT HAS HAD A PCN REACTION WITH IMMEDIATE RASH, FACIAL/TONGUE/THROAT SWELLING, SOB, OR LIGHTHEADEDNESS WITH HYPOTENSION:  #  #  YES  #  #  Has patient had a PCN reaction causing severe rash involving mucus membranes or skin necrosis: No Has patient had a PCN reaction that required hospitalization: No Has patient had a PCN reaction occurring within the last 10 years: No If all of the above answers are "NO", then may proceed  . Clarithromycin Shortness Of Breath and Rash  . Certolizumab Pegol Hives  . Tocilizumab Hives  . Doxycycline Rash  . Hydroxyzine Rash  . Lidoderm Other (See Comments)    "made me act weird"  . Pyrithione Zinc Rash    OBJECTIVE: Vitals:   05/30/20 0039 05/30/20 0300 05/30/20 0746 05/30/20 1255  BP: (!) 130/91 116/80 116/89 109/83  Pulse: 75 69 66 69  Resp: 14 15 16 20   Temp: 97.6 F (36.4 C) 98.6 F (37 C) (!) 97.3 F (36.3 C) (!) 97.3 F (36.3 C)  TempSrc: Oral Rectal Oral Oral  SpO2: 100% 94% 97% 93%  Weight:      Height:       Body mass index is 24.91 kg/m.  Physical Exam Vitals and nursing note reviewed.  Constitutional:      Appearance: He is well-developed.     Comments: Resting in bed on cell phone  HENT:     Mouth/Throat:     Dentition: Normal dentition. No dental abscesses.  Cardiovascular:     Rate and Rhythm: Normal rate and regular rhythm.     Heart sounds: Normal heart sounds.  Pulmonary:     Effort: Pulmonary effort is normal.     Breath sounds: Normal breath sounds.     Comments: Coarse crackles in bases b/l  Abdominal:     General: There is no distension.     Palpations: Abdomen is soft.     Tenderness: There is no abdominal tenderness.  Lymphadenopathy:     Cervical: No cervical adenopathy.  Skin:    General: Skin is warm and dry.     Findings: No rash.     Comments: L leg with petichial appearing rash amongst abrasions that are scabbed  Neurological:     Mental Status: He is alert and  oriented to person, place, and time.  Psychiatric:        Judgment: Judgment normal.     Comments: In good spirits today and engaged in care discussion.      Lab Results Lab Results  Component Value Date   WBC 13.7 (H) 05/30/2020   HGB 12.0 (L) 05/30/2020   HCT 37.3 (L) 05/30/2020   MCV 87.4 05/30/2020   PLT 139 (L) 05/30/2020    Lab Results  Component Value Date   CREATININE 0.90 05/30/2020   BUN 29 (H) 05/30/2020   NA 134 (L) 05/30/2020   K 3.7 05/30/2020   CL 100 05/30/2020   CO2 23 05/30/2020    Lab Results  Component Value Date   ALT 34 05/30/2020   AST 28 05/30/2020   ALKPHOS 79 05/30/2020   BILITOT  0.5 05/30/2020     Microbiology: Recent Results (from the past 240 hour(s))  Resp Panel by RT-PCR (Flu A&B, Covid) Nasopharyngeal Swab     Status: Abnormal   Collection Time: 05/25/20  3:56 PM   Specimen: Nasopharyngeal Swab; Nasopharyngeal(NP) swabs in vial transport medium  Result Value Ref Range Status   SARS Coronavirus 2 by RT PCR POSITIVE (A) NEGATIVE Final    Comment: RESULT CALLED TO, READ BACK BY AND VERIFIED WITH: Sherlean Foot RN 2032 05/25/20 A BROWNING (NOTE) SARS-CoV-2 target nucleic acids are DETECTED.  The SARS-CoV-2 RNA is generally detectable in upper respiratory specimens during the acute phase of infection. Positive results are indicative of the presence of the identified virus, but do not rule out bacterial infection or co-infection with other pathogens not detected by the test. Clinical correlation with patient history and other diagnostic information is necessary to determine patient infection status. The expected result is Negative.  Fact Sheet for Patients: EntrepreneurPulse.com.au  Fact Sheet for Healthcare Providers: IncredibleEmployment.be  This test is not yet approved or cleared by the [place] FDA and  has been authorized for detection and/or diagnosis of SARS-CoV-2 by FDA under an Emergency  Use Authorization (EUA).  This EUA will remain in effect (meaning this test can  be used) for the duration of  the COVID-19 declaration under Section 564(b)(1) of the Act, 21 U.S.C. section 360bbb-3(b)(1), unless the authorization is terminated or revoked sooner.     Influenza A by PCR NEGATIVE NEGATIVE Final   Influenza B by PCR NEGATIVE NEGATIVE Final    Comment: (NOTE) The Xpert Xpress SARS-CoV-2/FLU/RSV plus assay is intended as an aid in the diagnosis of influenza from Nasopharyngeal swab specimens and should not be used as a sole basis for treatment. Nasal washings and aspirates are unacceptable for Xpert Xpress SARS-CoV-2/FLU/RSV testing.  Fact Sheet for Patients: EntrepreneurPulse.com.au  Fact Sheet for Healthcare Providers: IncredibleEmployment.be  This test is not yet approved or cleared by the [place] FDA and has been authorized for detection and/or diagnosis of SARS-CoV-2 by FDA under an Emergency Use Authorization (EUA). This EUA will remain in effect (meaning this test can be used) for the duration of the COVID-19 declaration under Section 564(b)(1) of the Act, 21 U.S.C. section 360bbb-3(b)(1), unless the authorization is terminated or revoked.  Performed at [facility], [place] [address] [place], [place] [address]   Blood Culture (routine x 2)     Status: None   Collection Time: 05/25/20  3:59 PM   Specimen: BLOOD  Result Value Ref Range Status   Specimen Description BLOOD SITE NOT SPECIFIED  Final   Special Requests   Final    BOTTLES DRAWN AEROBIC AND ANAEROBIC Blood Culture results may not be optimal due to an excessive volume of blood received in culture bottles   Culture   Final    NO GROWTH 5 DAYS Performed at [facility], [place] [address], [place], [place] [address]    Report Status 05/30/2020 FINAL  Final  Blood Culture (routine x 2)     Status: None   Collection Time: 05/25/20  6:18 PM    Specimen: BLOOD RIGHT HAND  Result Value Ref Range Status   Specimen Description BLOOD RIGHT HAND  Final   Special Requests   Final    BOTTLES DRAWN AEROBIC AND ANAEROBIC Blood Culture adequate volume   Culture   Final    NO GROWTH 5 DAYS Performed at [facility], [place]  [address] [place], [place] [address]    Report Status 05/30/2020 FINAL  Final  Urine culture     Status: Abnormal   Collection Time: 05/25/20  7:16 PM   Specimen: In/Out Cath Urine  Result Value Ref Range Status   Specimen Description IN/OUT CATH URINE  Final   Special Requests   Final    NONE Performed at [facility], [place] [address], [place], [place] [address]    Culture 20,000 COLONIES/mL STAPHYLOCOCCUS EPIDERMIDIS (A)  Final   Report Status 05/29/2020 FINAL  Final   Organism ID, Bacteria STAPHYLOCOCCUS EPIDERMIDIS (A)  Final      Susceptibility   Staphylococcus epidermidis - MIC*    CIPROFLOXACIN >=8 RESISTANT Resistant     GENTAMICIN 4 SENSITIVE Sensitive     NITROFURANTOIN <=16 SENSITIVE Sensitive     OXACILLIN >=4 RESISTANT Resistant     TETRACYCLINE 2 SENSITIVE Sensitive     VANCOMYCIN 1 SENSITIVE Sensitive     TRIMETH/SULFA 80 RESISTANT Resistant     CLINDAMYCIN <=0.25 SENSITIVE Sensitive     RIFAMPIN <=0.5 SENSITIVE Sensitive     Inducible Clindamycin NEGATIVE Sensitive     * 20,000 COLONIES/mL STAPHYLOCOCCUS EPIDERMIDIS  Expectorated Sputum Assessment w Gram Stain, Rflx to Resp Cult     Status: None   Collection Time: 05/27/20  1:15 PM   Specimen: Expectorated Sputum  Result Value Ref Range Status   Specimen Description EXPECTORATED SPUTUM  Final   Special Requests NONE  Final   Sputum evaluation   Final    Sputum specimen not acceptable for testing.  Please recollect.   RESULT CALLED TO, READ BACK BY AND VERIFIED WITH: A PINCHER RN 1425 05/27/20 A BROWNING Performed at [facility], [place] [address] [place], [place] [address]    Report Status 05/27/2020 FINAL  Final  Acid  Fast Smear (AFB)     Status: None   Collection Time: 05/27/20  1:15 PM   Specimen: Sputum  Result Value Ref Range Status   AFB Specimen Processing Concentration  Final   Acid Fast Smear Negative  Final    Comment: (NOTE) Performed At: [facility] [place], [place] [id number] Rush Farmer MD OA:[id number]    Source (AFB) EXPECTORATED SPUTUM  Final    Comment: Performed at [facility], [place] [address], [place], [place] [address]  Culture, fungus without smear     Status: Abnormal (Preliminary result)   Collection Time: 05/27/20  1:15 PM   Specimen: Sputum; Other  Result Value Ref Range Status   Specimen Description EXPECTORATED SPUTUM  Final   Special Requests NONE  Final   Culture (A)  Final    CANDIDA GLABRATA CONTINUING TO HOLD Performed at [facility], [place] [address], [place], [place] [address]    Report Status PENDING  Incomplete     Janene Madeira, MSN, NP-C [facility] [email or web address] Pager: [phone] Office: [phone] [place]: [phone]

## 2020-05-30 NOTE — TOC Benefit Eligibility Note (Signed)
Patient Teacher, English as a foreign language completed.    The patient is currently admitted and upon discharge could be taking Xarelto 20 mg.  The current 30 day co-pay is, $76.84 due to a $29.84 deductible remaining.   The patient is insured through [place] Patient Advocate Specialist [place] Team Direct Number: [phone]  Fax: [phone]

## 2020-05-30 NOTE — Progress Notes (Signed)
-  NAME:  Tyrone Roman, MRN:  [id number], DOB:  18-May-1945, LOS: 5 ADMISSION DATE:  05/25/2020, CONSULTATION DATE:  05/26/2020 REFERRING MD:  Dr. Sloan Leiter, CHIEF COMPLAINT:   Acute on chronic hypoxia   History of Present Illness:  Tyrone Roman is a 75 y.o. male with PMH significant for CAD with prior MI s/p PCI with DES, Stroke, COPD, acute on chronic hypoxic respiratory failure on 2L [place] at baseline, recent COVID PNA, HTN, HLD, RA (previosuly on Rituxan), BPH prior lumbar surgery with herniated nucleus pulposis, Barrett's esophageal ulceration, and dysphagia who presented to the ED with complaints of acute worsening hypoxia. He denies any other acute complaints on admission.   Time line of previous events/hospitilizations:  1/24 - 1/26 First admission for COVID PNA, in an immunocompromised patient, PCR positive,  she received IV steroids and remdesivir but was unable to use baricitinib due to underling immunosuppressants. Seen with Coag-neg staph bacteremia, ID seen with no reccs for ABT's  2/10 - 2/11 Readmitted recurrent SOB/hypoxia with substernal CP. Concern that steroid taper was completed to quickly, patient was placed on 10 day taper.  2/18 - 2/25 Readmitted for recurrent SOB/hypoxia. COVID PCR remained positive. Negative for PE. Received zosyn/vanc with switch to PO Augmentin at d/c. Treated with short steroid taper   2/27 - 3/2 Readmitted and treated for SOB with fever and AMS. ? Aspiration PNA and received empiric Unasyn and switched to Augmentin on d/c  4/4 - 4/13 Readmitted from [facility] place for hypoxic respiratory failure to be due to acute on chronic congestive heart failure. COVID PCR positive.He received broad spectrum ABT which were eventually d/c due to no obvious cause. Concern for possible vasculities that temporary resolves with steroids. Encephalopathy and fevers resolve with moderate dose steroids. D/C'd on 30mg  BID prednisone and Eliquis for new acute PE seen on CTA  chest  Current admission patient continues to remain COVID positive by PCR. CTA chest reveals progressive PE no extending from the central right pulmonary artery ino the lobar segments. Patient states he has continued on high dose prednisone of 30mg  BID since d/c and has not missed any doses. Also denies any missed Eliquis doses.   Pertinent  Medical History  CAD with prior MI s/p PCI with DES, Stroke, COPD, acute on chronic hypoxic respiratory failure on 2L  at baseline, recent COVID PNA, HTN, HLD, RA (previosuly on Rituxan), BPH prior lumbar surgery with herniated nucleus pulposis, Barrett's esophageal ulceration, and dysphagia  Significant Hospital Events: Including procedures, antibiotic start and stop dates in addition to other pertinent events   . See above  . 4/21 admit, started on remdesivir, IV steroids . 4/22 monoclonal Ab given  Interim History / Subjective:  Feeling better, cough and dyspnea improved.  Objective   Blood pressure 116/89, pulse 66, temperature (!) 97.3 F (36.3 C), temperature source Oral, resp. rate 16, height 6\' 2"  (1.88 m), weight 88 kg, SpO2 97 %.        Intake/Output Summary (Last 24 hours) at 05/30/2020 1058 Last data filed at 05/30/2020 0800 Gross per 24 hour  Intake 1009.01 ml  Output 4500 ml  Net -3490.99 ml   Filed Weights   05/25/20 1538  Weight: 88 kg    Examination: General: ill appearing man sitting up in bed in NAD HEENT: mmm on nasal cannula Neuro: normal speech, moves all 4 extremities, follows commands CV: S1S2, reg rate and rhythm  PULM:  No wheezes, diminished breath sounds and decreased air entry bilaterally,  on nasal cannula, able to speak in full sentences GI: soft Extremities: no cyanosis or edema   Labs/imaging that I havepersonally reviewed  Chest xray 4/25 shows stable bilateral air space disease.  CMP shows Na 134, Cr 0.9,  CRP downtrending BNP is up   Resolved Hospital Problem list     Assessment & Plan:   Acute on chronic hypoxic respiratory failure  Prolonged COVID infection in the setting of chronic immunosuppression-- still within a therapeutic window since his last rituximab infusion, with evidence of upper lobe predominant cystic bronchiectasis.  New Pulmonary embolism secondary to covid with suspected eliquis failure -Seen with progressive PE on repeat CT 4/20  Bilateral pneumonia- presumed to be covid, but OI cannot be excluded - work up pending Hx of COPD  P: -Con't supplemental O2 as requried to maintain SpO2 >90%. I have titrated him down to [facility]. His baseline prior to admission is  - improvement with Covid treatment with MAB and remdesevir.  Await work up with respiratory culture, fungal, AFB, DFA stain for PJP.  - since he is improving with covid treatment would  Hold off on bronchoscopy for now. -If he clinically deteriorates I would empirically treated for resistant bacterial or fungal pneumonia. -IS, pulmonary hygiene. OOB mobility would be ideal. - fungitell pending -appreciate ID's recommendations -recommend discharge on lovenox rather than DOAC given DOAC failure  HFpEF, elevated BNP -recommend diuresis with serial BNP levels  Aortic dilation on echo- concern for mild proximal aneuryms -avoid quinolones if possible  Pulmonary will follow. I've discussed with nurse to continue titrating down oxygen.  Lenice Llamas, MD Pulmonary and [place]    Labs   CBC: Recent Labs  Lab 05/25/20 1547 05/25/20 1626 05/26/20 0141 05/27/20 0054 05/28/20 0047 05/29/20 0608 05/30/20 0209  WBC 11.6*  --  7.9 8.0 10.4 12.8* 13.7*  NEUTROABS 10.9*  --   --   --   --   --   --   HGB 11.5*   < > 10.1* 12.6* 10.7* 10.8* 12.0*  HCT 36.6*   < > 32.2* 38.1* 33.2* 33.7* 37.3*  MCV 89.3  --  90.2 95.3 89.0 88.5 87.4  PLT 155  --  126* 203 PLATELET CLUMPS NOTED ON SMEAR, UNABLE TO ESTIMATE 113* 139*   < > = values in this interval not displayed.     Basic Metabolic Panel: Recent Labs  Lab 05/25/20 1547 05/25/20 1626 05/25/20 2231 05/27/20 0054 05/28/20 0047 05/28/20 0300 05/29/20 0608 05/30/20 0209  NA 129* 128*  --  133* 130*  --  133* 134*  K 4.6 4.7  --  4.8 4.7  --  4.4 3.7  CL 98 99  --  99 97*  --  99 100  CO2 21*  --   --  28 21*  --  27 23  GLUCOSE 225* 228*  --  110* 554*  --  265* 162*  BUN 21 21  --  16 24*  --  30* 29*  CREATININE 0.90 0.80  --  0.80 1.09  --  0.76 0.90  CALCIUM 9.5  --   --  8.9 9.0  --  8.9 9.5  MG  --   --  1.7  --   --  2.0 1.8 1.9

## 2020-05-30 NOTE — Care Management Important Message (Signed)
Important Message  Patient Details  Name: Tyrone Roman MRN: [id number] Date of Birth: 09/03/1945   Medicare Important Message Given:  Yes  Pt. On precautions gave to nurse to deliver to pt.     Holli Humbles Smith 05/30/2020, 9:22 AM

## 2020-05-30 NOTE — Consult Note (Signed)
   [facility] Inpatient Consult   05/30/2020  HOMMER CUNLIFFE [birth year] [id number]  [place] [employer] [ACO] Patient: Medicare CMS DCE  Patient was screened for [place] Management services and found to be active in the [employer] . Patient will have the transition of care call conducted by the primary care provider. This patient will be followed for chronic disease management with the [facility] Care Management team.  Plan: Notification sent to the [place] Management and made aware of hospital admission and any post hospital follow up needs.  Please contact for further questions,  Natividad Brood, RN BSN [facility] Liaison  [phone] business mobile phone Toll free office [phone]  Fax number: [phone] [email or web address]

## 2020-05-30 NOTE — Progress Notes (Signed)
   05/10/20 1620  Assess: MEWS Score  Temp 99.2 F (37.3 C)  BP 92/63  Pulse Rate (!) 109  Resp 17  Level of Consciousness Alert  SpO2 93 %  O2 Device Nasal Cannula  O2 Flow Rate (L/min) 4 L/min  Assess: MEWS Score  MEWS Temp 0  MEWS Systolic 1  MEWS Pulse 1  MEWS RR 0  MEWS LOC 0  MEWS Score 2  MEWS Score Color Yellow  Assess: if the MEWS score is Yellow or Red  Were vital signs taken at a resting state? Yes  Focused Assessment No change from prior assessment  Early Detection of Sepsis Score *See Row Information* Medium  MEWS guidelines implemented *See Row Information* No, vital signs rechecked  Treat  MEWS Interventions Administered prn meds/treatments  Take Vital Signs  Increase Vital Sign Frequency  Yellow: Q 2hr X 2 then Q 4hr X 2, if remains yellow, continue Q 4hrs  Escalate  MEWS: Escalate Yellow: discuss with charge nurse/RN and consider discussing with provider and RRT  Document  Patient Outcome Stabilized after interventions  Progress note created (see row info) Yes

## 2020-05-30 NOTE — Progress Notes (Addendum)
[place] for heparin Indication: PE/DVT 05/14/20, 05/25/20  Allergies  Allergen Reactions  . Cephalexin Shortness Of Breath, Rash and Other (See Comments)    Tolerated Augmentin 2015 and 2017 (12-2017). Tolerated nafcillin 12/2017 PATIENT HAS HAD A PCN REACTION WITH IMMEDIATE RASH, FACIAL/TONGUE/THROAT SWELLING, SOB, OR LIGHTHEADEDNESS WITH HYPOTENSION:  #  #  YES  #  #  Has patient had a PCN reaction causing severe rash involving mucus membranes or skin necrosis: No Has patient had a PCN reaction that required hospitalization: No Has patient had a PCN reaction occurring within the last 10 years: No If all of the above answers are "NO", then may proceed  . Clarithromycin Shortness Of Breath and Rash  . Certolizumab Pegol Hives  . Tocilizumab Hives  . Doxycycline Rash  . Hydroxyzine Rash  . Lidoderm Other (See Comments)    "made me act weird"  . Pyrithione Zinc Rash    Patient Measurements: Height: 6\' 2"  (188 cm) Weight: 88 kg (194 lb 0.1 oz) (Per 4/13 note) IBW/kg (Calculated) : 82.2 Heparin Dosing Weight: TBW  Vital Signs: Temp: 97.3 F (36.3 C) (04/25 0746) Temp Source: Oral (04/25 0746) BP: 116/89 (04/25 0746) Pulse Rate: 66 (04/25 0746)  Labs: Recent Labs    05/28/20 0047 05/28/20 1335 05/28/20 2037 05/29/20 0608 05/30/20 0209  HGB 10.7*  --   --  10.8* 12.0*  HCT 33.2*  --   --  33.7* 37.3*  PLT PLATELET CLUMPS NOTED ON SMEAR, UNABLE TO ESTIMATE  --   --  113* 139*  APTT 142* 74* 61* 73*  --   HEPARINUNFRC >1.10* 0.80* 0.65 0.69 0.62  CREATININE 1.09  --   --  0.76 0.90    Estimated Creatinine Clearance: 82.5 mL/min (by C-G formula based on SCr of 0.9 mg/dL).  Assessment: 69 YOM on apixaban PTA for PE 05/14/20 who received therapeutic heparin and enoxaparin 4/6- 4/13, was discharged from hospital on apixaban. On readmission 4/20, CT scan found a new PE and 4/21 dopplers found age indeterminate LLE DVT. Apixaban now held  for possible failure. Pharmacy consulted to dose heparin. Last dose apix on 4/20 AM.   HL 0.62 and therapeutic. H/H, plt stable. Team considering if this admission represents apixaban failure. If so, may switch to enoxaparin vs dabigatran.   Goal of Therapy:  Heparin level 0.3-0.7 units/ml Monitor platelets by anticoagulation protocol: Yes   Plan:  Continue heparin to 1150 units/hr Daily heparin level, CBC Monitor for signs and symptoms of bleeding F/u long term plan   Benetta Spar, PharmD, BCPS, [place] Clinical Pharmacist  Please check AMION for all [place] phone numbers After 10:00 PM, call [place]

## 2020-05-30 NOTE — TOC Benefit Eligibility Note (Signed)
Transition of Care [facility]) Benefit Eligibility Note    Patient Details  Name: Tyrone Roman MRN: [id number] Date of Birth: 01/28/1946   Medication/Dose: Enoxaparin 162m. bid for 30 day supply  Covered?: Yes  Tier:  (4)     Spoke with Person/Company/Phone Number:: Per RColin RheinW/[facility] Help DeskPH# [phone] Co-Pay: [phone]  Prior Approval: No  Deductible: Met       HShelda AltesPhone Number: 05/30/2020, 9:34 AM

## 2020-05-30 NOTE — Progress Notes (Signed)
[place] for heparin >> enoxaparin Indication: PE/DVT 05/14/20, 05/25/20  Allergies  Allergen Reactions  . Cephalexin Shortness Of Breath, Rash and Other (See Comments)    Tolerated Augmentin 2015 and 2017 (12-2017). Tolerated nafcillin 12/2017 PATIENT HAS HAD A PCN REACTION WITH IMMEDIATE RASH, FACIAL/TONGUE/THROAT SWELLING, SOB, OR LIGHTHEADEDNESS WITH HYPOTENSION:  #  #  YES  #  #  Has patient had a PCN reaction causing severe rash involving mucus membranes or skin necrosis: No Has patient had a PCN reaction that required hospitalization: No Has patient had a PCN reaction occurring within the last 10 years: No If all of the above answers are "NO", then may proceed  . Clarithromycin Shortness Of Breath and Rash  . Certolizumab Pegol Hives  . Tocilizumab Hives  . Doxycycline Rash  . Hydroxyzine Rash  . Lidoderm Other (See Comments)    "made me act weird"  . Pyrithione Zinc Rash    Patient Measurements: Height: 6\' 2"  (188 cm) Weight: 88 kg (194 lb 0.1 oz) (Per 4/13 note) IBW/kg (Calculated) : 82.2    Vital Signs: Temp: 97.3 F (36.3 C) (04/25 1255) Temp Source: Oral (04/25 1255) BP: 109/83 (04/25 1255) Pulse Rate: 69 (04/25 1255)  Labs: Recent Labs    05/28/20 0047 05/28/20 1335 05/28/20 2037 05/29/20 0608 05/30/20 0209  HGB 10.7*  --   --  10.8* 12.0*  HCT 33.2*  --   --  33.7* 37.3*  PLT PLATELET CLUMPS NOTED ON SMEAR, UNABLE TO ESTIMATE  --   --  113* 139*  APTT 142* 74* 61* 73*  --   HEPARINUNFRC >1.10* 0.80* 0.65 0.69 0.62  CREATININE 1.09  --   --  0.76 0.90    Estimated Creatinine Clearance: 82.5 mL/min (by C-G formula based on SCr of 0.9 mg/dL).  Assessment: 82 YOM on apixaban PTA for PE 05/14/20 who received therapeutic heparin and enoxaparin 4/6- 4/13. Patient was discharged from hospital on apixaban. On readmission 4/20, CT scan found a new PE and 4/21 dopplers found age indeterminate LLE DVT. Apixaban now held for  likely failure. Patient states compliance with apixaban. Pharmacy consulted to change heparin to enoxaparin with eventual plan for rivaroxaban. Good renal function.    Goal of Therapy:  Heparin level 0.3-0.7 units/ml Monitor platelets by anticoagulation protocol: Yes   Plan:  Stop heparin at 1800 Start enoxaparin 90mg  Q12 hr at 1800 Monitor for signs and symptoms of bleeding F/u eventual switch to rivaroxaban     Benetta Spar, PharmD, BCPS, BCCP Clinical Pharmacist  Please check AMION for all [place] phone numbers After 10:00 PM, call [place]

## 2020-05-31 ENCOUNTER — Telehealth: Payer: Self-pay | Admitting: Internal Medicine

## 2020-05-31 ENCOUNTER — Ambulatory Visit: Payer: Medicare Other | Admitting: Primary Care

## 2020-05-31 DIAGNOSIS — J8410 Pulmonary fibrosis, unspecified: Secondary | ICD-10-CM

## 2020-05-31 LAB — COMPREHENSIVE METABOLIC PANEL
ALT: 50 U/L — ABNORMAL HIGH (ref 0–44)
AST: 46 U/L — ABNORMAL HIGH (ref 15–41)
Albumin: 2.5 g/dL — ABNORMAL LOW (ref 3.5–5.0)
Alkaline Phosphatase: 82 U/L (ref 38–126)
Anion gap: 10 (ref 5–15)
BUN: 39 mg/dL — ABNORMAL HIGH (ref 8–23)
CO2: 25 mmol/L (ref 22–32)
Calcium: 9.5 mg/dL (ref 8.9–10.3)
Chloride: 97 mmol/L — ABNORMAL LOW (ref 98–111)
Creatinine, Ser: 1.04 mg/dL (ref 0.61–1.24)
GFR, Estimated: >60 mL/min (ref >60)
Glucose, Bld: 88 mg/dL (ref 70–99)
Potassium: 4.3 mmol/L (ref 3.5–5.1)
Sodium: 132 mmol/L — ABNORMAL LOW (ref 135–145)
Total Bilirubin: 1.1 mg/dL (ref 0.3–1.2)
Total Protein: 5 g/dL — ABNORMAL LOW (ref 6.5–8.1)

## 2020-05-31 LAB — CBC
HCT: 40.4 % (ref 39.0–52.0)
Hemoglobin: 13.1 g/dL (ref 13.0–17.0)
MCH: 28.2 pg (ref 26.0–34.0)
MCHC: 32.4 g/dL (ref 30.0–36.0)
MCV: 87.1 fL (ref 80.0–100.0)
Platelets: 174 10*3/uL (ref 150–400)
RBC: 4.64 MIL/uL (ref 4.22–5.81)
RDW: 19.9 % — ABNORMAL HIGH (ref 11.5–15.5)
WBC: 14.6 10*3/uL — ABNORMAL HIGH (ref 4.0–10.5)
nRBC: 0 % (ref 0.0–0.2)

## 2020-05-31 LAB — GLUCOSE, CAPILLARY
Glucose-Capillary: 139 mg/dL — ABNORMAL HIGH (ref 70–99)
Glucose-Capillary: 278 mg/dL — ABNORMAL HIGH (ref 70–99)
Glucose-Capillary: 295 mg/dL — ABNORMAL HIGH (ref 70–99)
Glucose-Capillary: 183 mg/dL — ABNORMAL HIGH (ref 70–99)

## 2020-05-31 LAB — MAGNESIUM: Magnesium: 2 mg/dL (ref 1.7–2.4)

## 2020-05-31 LAB — FUNGITELL, SERUM: Fungitell Result: 41 pg/mL (ref <80)

## 2020-05-31 LAB — BRAIN NATRIURETIC PEPTIDE: B Natriuretic Peptide: 325 pg/mL — ABNORMAL HIGH (ref 0.0–100.0)

## 2020-05-31 LAB — C-REACTIVE PROTEIN: CRP: 1.2 mg/dL — ABNORMAL HIGH (ref <1.0)

## 2020-05-31 LAB — PROCALCITONIN: Procalcitonin: <0.1 ng/mL

## 2020-05-31 MED ORDER — SULFAMETHOXAZOLE-TRIMETHOPRIM 400-80 MG PO TABS
1.0000 | ORAL_TABLET | Freq: Every day | ORAL | Status: DC
  Administered 2020-05-31 – 2020-06-02 (×3): 1 via ORAL
  Filled 2020-05-31 (×3): qty 1

## 2020-05-31 MED ORDER — METHYLPREDNISOLONE SODIUM SUCC 40 MG IJ SOLR
40.0000 mg | Freq: Every day | INTRAMUSCULAR | Status: DC
  Administered 2020-06-01: 40 mg via INTRAVENOUS
  Filled 2020-05-31: qty 1

## 2020-05-31 MED ORDER — INSULIN GLARGINE 100 UNIT/ML ~~LOC~~ SOLN
35.0000 [IU] | Freq: Every day | SUBCUTANEOUS | Status: DC
  Administered 2020-06-01 – 2020-06-02 (×2): 35 [IU] via SUBCUTANEOUS
  Filled 2020-05-31 (×2): qty 0.35

## 2020-05-31 MED ORDER — INSULIN ASPART 100 UNIT/ML ~~LOC~~ SOLN
3.0000 [IU] | Freq: Three times a day (TID) | SUBCUTANEOUS | Status: DC
  Administered 2020-05-31 – 2020-06-02 (×7): 3 [IU] via SUBCUTANEOUS

## 2020-05-31 NOTE — TOC Initial Note (Signed)
Transition of Care (TOC) - Initial/Assessment Note  Marvetta Gibbons RN, BSN Transitions of Care Unit 4E- RN Case Manager See Treatment Team for direct phone #    Patient Details  Name: Tyrone Roman MRN: [id number] Date of Birth: [birth year]  Transition of Care [facility]) CM/SW Contact:    Dawayne Patricia, RN Phone Number: 05/31/2020, 5:03 PM  Clinical Narrative:                 Pt admitted from home with COVID, noted orders for [facility] and DME- call made to daughter [name] and to Patient in the room. Per conversations pt is refusing SNF, states he wants to return home. Pt is stating that he will "hire" some help maybe 2-3 days/week for a couple hours a day to assist him at home. Pt voiced that he would appreciate having some info on private duty and is agreeable to a "Google" search for this with list provided for information purposes only to assist him and his daughter in a place to start in finding an agency. Will provide pt info prior to discharge.   Per pt and daughter pt was active with [facility] prior to admit- daughter would like to look at other agencies- provided Medicare.gov site info for her to look at, [facility] to f/u with pt and daughter tomorrow to see if they want to continue with [facility] or select another agency.   Also discussed DME with pt- he would like a trapeze bar for his hospital bed at home, he also has w/c and home 02 with [employer].  Pt also reports he needs RW and 3n1 for home. Orders have been placed- per pt request will have DME delivered to the home.   TOC to follow up with pt and daughter for further transition needs.   Expected Discharge Plan: [place] Barriers to Discharge: Continued Medical Work up   Patient Goals and CMS Choice Patient states their goals for this hospitalization and ongoing recovery are:: to return home CMS Medicare.gov Compare Post Acute Care list provided to:: Patient Choice offered to / list presented to : [place]  Expected Discharge Plan and Services Expected Discharge Plan: [place]   Discharge Planning Services: CM Consult Post Acute Care Choice: Durable Medical Equipment,Home Health,Resumption of Svcs/PTA Provider Living arrangements for the past 2 months: Single Family Home                 DME Arranged: 3-N-1,Trapeze,Walker rolling DME Agency: [employer] Date DME Agency Contacted: 05/31/20 Time DME Agency Contacted: 51 Representative spoke with at DME Agency: [facility] HH Arranged: [place] Work          Prior Living Arrangements/Services Living arrangements for the past 2 months: [place] with:: Self Patient language and need for interpreter reviewed:: Yes Do you feel safe going back to the place where you live?: Yes      Need for Family Participation in Patient Care: Yes (Comment) Care giver support system in place?: Yes (comment) Current home services: DME,Home PT,Home RN,Homehealth aide Criminal Activity/Legal Involvement Pertinent to Current Situation/Hospitalization: No - Comment as needed  Activities of Daily Living      Permission Sought/Granted Permission sought to share information with : Facility Art therapist granted to share information with : Yes, Verbal Permission Granted     Permission granted to share info w AGENCY: DME/HH        Emotional Assessment Appearance:: Appears stated age Attitude/Demeanor/Rapport:  Engaged Affect (typically observed): Appropriate Orientation: : Oriented to Self,Oriented to Place,Oriented to  Time,Oriented to Situation Alcohol / Substance Use: Not Applicable Psych Involvement: No (comment)  Admission diagnosis:  Acute hypoxemic respiratory failure (HCC) [J96.01] Sepsis, due to unspecified organism, unspecified whether acute organ dysfunction present ([place]) [A41.9] Multiple subsegmental pulmonary emboli without acute cor pulmonale ([place])  [I26.94] Patient Active Problem List   Diagnosis Date Noted  . Acute hypoxemic respiratory failure ([place]) 05/25/2020  . Pulmonary embolus ([place]) 05/25/2020  . SIRS (systemic inflammatory response syndrome) ([place]) 05/25/2020  . Elevated troponin 05/25/2020  . Weakness of both lower extremities   . FUO (fever of unknown origin)   . Sepsis due to pneumonia ([place]) 05/09/2020  . Post-COVID-19 syndrome manifesting as chronic cough 05/09/2020  . COPD without exacerbation ([place]) 05/09/2020  . Essential hypertension 04/26/2020  . Acute hypoxemic respiratory failure due to COVID-19 ([place]) 04/03/2020  . Acute respiratory failure with hypoxia ([place]) 03/28/2020  . Physical deconditioning 03/26/2020  . Normocytic anemia 03/26/2020  . Severe sepsis ([place]) 03/25/2020  . Multifocal pneumonia 03/17/2020  . COPD with acute exacerbation ([place]) 03/17/2020  . Hypoxia 03/17/2020  . CAD (coronary artery disease) 03/17/2020  . Thrombocytopenia ([place]) 03/08/2020  . Stage 3b chronic kidney disease ([place]) 03/08/2020  . Pneumonia due to COVID-19 virus 02/29/2020  . COVID-19 02/29/2020  . Closed nondisplaced fracture of shaft of third metacarpal bone of left hand 03/04/2019  . Wound infection after surgery 01/01/2018  . Medication monitoring encounter 01/01/2018  . Sepsis ([place]) 12/10/2017  . HNP (herniated nucleus pulposus), lumbar 10/07/2017  . Lumbar herniated disc 10/07/2017  . Psoriasis of scalp 03/27/2016  . Rheumatoid arthritis ([place]) 03/25/2013  . GI bleeding 02/20/2012  . Dyspnea on exertion 09/08/2010  . COPD (chronic obstructive pulmonary disease) ([place]) 08/04/2010  . Bruising 08/04/2010  . TINNITUS 12/16/2009  . Dizziness and giddiness 12/16/2009  . NECK SPRAIN AND STRAIN 10/21/2009  . LUMBAR SPRAIN AND STRAIN 10/21/2009  . Hyperlipidemia 06/10/2009  . Coronary artery disease of native artery of native heart with stable angina pectoris ([place]) 06/10/2009  . GERD 06/10/2009  . BARRETTS ESOPHAGUS 06/10/2009   . CONCUSSION WITH LOC OF 30 MINUTES OR LESS 02/28/2009  . NEPHROLITHIASIS 07/01/2007  . CONTACT DERMATITIS 12/12/2006  . BPH (benign prostatic hyperplasia) 11/04/2006  . COLONIC POLYPS 10/21/2003  . DIVERTICULOSIS, COLON 10/21/2003   PCP:  [name], MD Pharmacy:   [facility] [address] [place] [address] Phone: [phone] Fax: [phone]     Social Determinants of Health (SDOH) Interventions    Readmission Risk Interventions No flowsheet data found.

## 2020-05-31 NOTE — Progress Notes (Signed)
-  NAME:  Tyrone Roman, MRN:  [id number], DOB:  09-Jul-1945, LOS: 6 ADMISSION DATE:  05/25/2020, CONSULTATION DATE:  05/26/2020 REFERRING MD:  Dr. Sloan Leiter, CHIEF COMPLAINT:   Acute on chronic hypoxia   History of Present Illness:  Tyrone Roman is a 75 y.o. male with PMH significant for CAD with prior MI s/p PCI with DES, Stroke, COPD, acute on chronic hypoxic respiratory failure on 2L  at baseline, recent COVID PNA, HTN, HLD, RA (previosuly on Rituxan), BPH prior lumbar surgery with herniated nucleus pulposis, Barrett's esophageal ulceration, and dysphagia who presented to the ED with complaints of acute worsening hypoxia. He denies any other acute complaints on admission.   Time line of previous events/hospitilizations:  1/24 - 1/26 First admission for COVID PNA, in an immunocompromised patient, PCR positive,  she received IV steroids and remdesivir but was unable to use baricitinib due to underling immunosuppressants. Seen with Coag-neg staph bacteremia, ID seen with no reccs for ABT's  2/10 - 2/11 Readmitted recurrent SOB/hypoxia with substernal CP. Concern that steroid taper was completed to quickly, patient was placed on 10 day taper.  2/18 - 2/25 Readmitted for recurrent SOB/hypoxia. COVID PCR remained positive. Negative for PE. Received zosyn/vanc with switch to PO Augmentin at d/c. Treated with short steroid taper   2/27 - 3/2 Readmitted and treated for SOB with fever and AMS. ? Aspiration PNA and received empiric Unasyn and switched to Augmentin on d/c  4/4 - 4/13 Readmitted from [facility] place for hypoxic respiratory failure to be due to acute on chronic congestive heart failure. COVID PCR positive.He received broad spectrum ABT which were eventually d/c due to no obvious cause. Concern for possible vasculities that temporary resolves with steroids. Encephalopathy and fevers resolve with moderate dose steroids. D/C'd on 30mg  BID prednisone and Eliquis for new acute PE seen on CTA  chest  Current admission patient continues to remain COVID positive by PCR. CTA chest reveals progressive PE no extending from the central right pulmonary artery ino the lobar segments. Patient states he has continued on high dose prednisone of 30mg  BID since d/c and has not missed any doses. Also denies any missed Eliquis doses.   Pertinent  Medical History  CAD with prior MI s/p PCI with DES, Stroke, COPD, acute on chronic hypoxic respiratory failure on 2L [name] at baseline, recent COVID PNA, HTN, HLD, RA (previosuly on Rituxan), BPH prior lumbar surgery with herniated nucleus pulposis, Barrett's esophageal ulceration, and dysphagia  Significant Hospital Events: Including procedures, antibiotic start and stop dates in addition to other pertinent events   . See above  . 4/21 admit, started on remdesivir, IV steroids . 4/22 monoclonal Ab given  Interim History / Subjective:  Using restroom this am Weaning O2 - down to 4LNC    Objective   Blood pressure 105/83, pulse 88, temperature (!) 97.4 F (36.3 C), temperature source Oral, resp. rate 17, height 6\' 2"  (1.88 m), weight 88 kg, SpO2 93 %.        Intake/Output Summary (Last 24 hours) at 05/31/2020 0936 Last data filed at 05/31/2020 0900 Gross per 24 hour  Intake 1324.9 ml  Output 5000 ml  Net -3675.1 ml   Filed Weights   05/25/20 1538  Weight: 88 kg    Examination: General: ill appearing man NAD working with staff to use restroom  HEENT: mm on nasal cannula Neuro: MAE CV: NSR on monitor  PULM:  resps even non labored on [place], no sig DOE noted  Labs/imaging that I havepersonally reviewed  No new CXR  BNP trending down  Chem Na 132, SCr 1.04 CBC    Resolved Hospital Problem list     Assessment & Plan:  Acute on chronic hypoxic respiratory failure  Prolonged COVID infection in the setting of chronic immunosuppression-- still within a therapeutic window since his last rituximab infusion, with evidence of upper lobe  predominant cystic bronchiectasis.  New Pulmonary embolism secondary to covid with suspected eliquis failure -Seen with progressive PE on repeat CT 4/20  Bilateral pneumonia- presumed to be covid, but OI cannot be excluded - work up pending Hx of COPD  P: Continue to wean supplemental O2 as able to maintain sats>90% -- baseline 2L [name]  AFB, DFA stain pending  Continue steroids - wean slowly  Pulmonary hygiene - OOB as able  ID following  fungitell pending  HFpEF, elevated BNP -continue gentle diuresis as Scr and BP tol  BNP trending down   Aortic dilation on echo- concern for mild proximal aneuryms -avoid quinolones if possible    Tyrone Madrid, NP Pulmonary/Critical Care Medicine  05/31/2020  9:36 AM

## 2020-05-31 NOTE — Progress Notes (Signed)
Pt and his daughter concerned about Solu-Medrol 40 mg BID, requested to talk with MD to reduce or tape down the dose not more than 30 mg BID.  Dr. Konrad Felix notified. MD aware. Explained the purpose of the medication.  Encouraged Pt to discuss with MD in morning round. Pt expressed his frustration that he has no idea he has been getting 40 mg of Solu-Medrol since 05/26/20 per [facility] recorded. Pt agreed to get 40 mg dose tonight and he will discuss with MD at am.   He's hemodynamically stable. On 6 LPM of HFNCL, SPO2 93-98%. No acute distress noted. Will monitor.  Kennyth Lose, RN

## 2020-05-31 NOTE — Progress Notes (Signed)
[facility] for Infectious Disease  Date of Admission:  05/25/2020      Total days of antibiotics 0   ASSESSMENT: Tyrone Roman is a 75 y.o. male with respiratory failure in the setting of what seems to be acute COVID pneumonia (2nd illness).   Other differential to consider could be vasculitis that is partially treated with steroid courses (although he has been taking his prednisone 30 mg BID since last discharge). Atypical infection studies including serum fungitel negative, DFA stain pending and Candida glabrata growing lightly in from sputum. D/W Hospitalist and Pulm - suspect more of a colonizer as his CT does not reflect classic fungal findings.   He has been improving with standard COVID treatment. PCCM following and holding off on bronch for now. Down to 3-4 LPM nasal cannula at rest. Baseline 2 LPM. Suspect he will be on more prolonged course of prednisone for lung disease and RA --> will start PJP/nocardia prophylaxisis with Bactrim daily.   COVID, recurrent, no serologic protection = s/p Bebtelovimab monoclonal antibody during acute severe illness in addition to standard of care.  He would be a great candidate for Evusheld monoclonal antibodies after he recovers from acute infection for future pre-exposure COVID prophylaxis vs routine IVIG. Allergy/Immunology referral or d/w rheumatologist would be helpful to decide how to further proceed.   PE following COVID PNA = progressive PE on repeat CTA 4/20, possible failure on Eliquis; enoxaparin preferred per PCCM team.    A/C CHF = diuresis per primary team.   Will sign off but happy to see him back should his condition change or if there is questions.     PLAN: 1. Steroids per primary/PCCM 2. Consider Evusheld injectable MAB for PEP vs IVIG infusions --> allergy/immunology vs rheumatology would be helpful to determine.  3. Bactrim SS once daily for prophylaxis while on steroid equivalent of prednisone > 20 mg  daily.      Principal Problem:   COVID-19 Active Problems:   Rheumatoid arthritis ([place])   Acute hypoxemic respiratory failure (HCC)   Pulmonary embolus (HCC)   SIRS (systemic inflammatory response syndrome) (HCC)   Elevated troponin   . enoxaparin (LOVENOX) injection  90 mg Subcutaneous Q12H  . insulin aspart  0-20 Units Subcutaneous TID WC  . insulin aspart  0-5 Units Subcutaneous QHS  . insulin aspart  3 Units Subcutaneous TID WC  . [START ON 06/01/2020] insulin glargine  35 Units Subcutaneous Daily  . [START ON 06/01/2020] methylPREDNISolone (SOLU-MEDROL) injection  40 mg Intravenous Daily  . metoprolol tartrate  50 mg Oral BID  . mometasone-formoterol  2 puff Inhalation BID  . pantoprazole  40 mg Oral Daily  . simvastatin  20 mg Oral Daily  . sulfamethoxazole-trimethoprim  1 tablet Oral Daily  . tamsulosin  0.4 mg Oral Daily  . umeclidinium bromide  1 puff Inhalation Daily    SUBJECTIVE: Doing well today. No complaints. Up in the chair.   Review of Systems: Review of Systems  Constitutional: Positive for malaise/fatigue. Negative for chills, fever and weight loss.  HENT: Negative for sore throat.   Eyes: Negative for blurred vision and double vision.  Respiratory: Positive for cough and shortness of breath. Negative for sputum production.   Cardiovascular: Negative for chest pain, orthopnea and leg swelling.  Gastrointestinal: Negative for abdominal pain, diarrhea and vomiting.  Genitourinary: Negative for dysuria and flank pain.  Musculoskeletal: Negative for joint pain, myalgias and neck pain.  Skin:  Negative for rash.  Neurological: Negative for dizziness, tingling and headaches.  Psychiatric/Behavioral: Negative for depression and substance abuse. The patient is not nervous/anxious and does not have insomnia.     Allergies  Allergen Reactions  . Cephalexin Shortness Of Breath, Rash and Other (See Comments)    Tolerated Augmentin 2015 and 2017 (12-2017).  Tolerated nafcillin 12/2017 PATIENT HAS HAD A PCN REACTION WITH IMMEDIATE RASH, FACIAL/TONGUE/THROAT SWELLING, SOB, OR LIGHTHEADEDNESS WITH HYPOTENSION:  #  #  YES  #  #  Has patient had a PCN reaction causing severe rash involving mucus membranes or skin necrosis: No Has patient had a PCN reaction that required hospitalization: No Has patient had a PCN reaction occurring within the last 10 years: No If all of the above answers are "NO", then may proceed  . Clarithromycin Shortness Of Breath and Rash  . Certolizumab Pegol Hives  . Tocilizumab Hives  . Doxycycline Rash  . Hydroxyzine Rash  . Lidoderm Other (See Comments)    "made me act weird"  . Pyrithione Zinc Rash    OBJECTIVE: Vitals:   05/31/20 0352 05/31/20 0646 05/31/20 0908 05/31/20 1310  BP: 104/81  105/83 114/86  Pulse: 71 77 88 73  Resp: 20 20 17 19   Temp: 97.6 F (36.4 C)  (!) 97.4 F (36.3 C) (!) 97.4 F (36.3 C)  TempSrc: Oral  Oral Oral  SpO2: 90% 96% 93% 96%  Weight:      Height:       Body mass index is 24.91 kg/m.  Physical Exam Vitals and nursing note reviewed.  Constitutional:      Appearance: He is well-developed.     Comments: Resting in recliner  HENT:     Mouth/Throat:     Dentition: Normal dentition. No dental abscesses.  Cardiovascular:     Rate and Rhythm: Normal rate and regular rhythm.     Heart sounds: Normal heart sounds.  Pulmonary:     Effort: Pulmonary effort is normal.     Breath sounds: Normal breath sounds.     Comments: Coarse crackles in bases b/l  Abdominal:     General: There is no distension.     Palpations: Abdomen is soft.     Tenderness: There is no abdominal tenderness.  Lymphadenopathy:     Cervical: No cervical adenopathy.  Skin:    General: Skin is warm and dry.     Findings: No rash.     Comments: L leg with petichial appearing rash amongst abrasions that are scabbed  Neurological:     Mental Status: He is alert and oriented to person, place, and time.   Psychiatric:        Judgment: Judgment normal.     Comments: In good spirits today and engaged in care discussion.      Lab Results Lab Results  Component Value Date   WBC 14.6 (H) 05/31/2020   HGB 13.1 05/31/2020   HCT 40.4 05/31/2020   MCV 87.1 05/31/2020   PLT 174 05/31/2020    Lab Results  Component Value Date   CREATININE 1.04 05/31/2020   BUN 39 (H) 05/31/2020   NA 132 (L) 05/31/2020   K 4.3 05/31/2020   CL 97 (L) 05/31/2020   CO2 25 05/31/2020    Lab Results  Component Value Date   ALT 50 (H) 05/31/2020   AST 46 (H) 05/31/2020   ALKPHOS 82 05/31/2020   BILITOT 1.1 05/31/2020     Microbiology: Recent Results (from the past  240 hour(s))  Resp Panel by RT-PCR (Flu A&B, Covid) Nasopharyngeal Swab     Status: Abnormal   Collection Time: 05/25/20  3:56 PM   Specimen: Nasopharyngeal Swab; Nasopharyngeal(NP) swabs in vial transport medium  Result Value Ref Range Status   SARS Coronavirus 2 by RT PCR POSITIVE (A) NEGATIVE Final    Comment: RESULT CALLED TO, READ BACK BY AND VERIFIED WITH: Sherlean Foot RN 2032 05/25/20 A BROWNING (NOTE) SARS-CoV-2 target nucleic acids are DETECTED.  The SARS-CoV-2 RNA is generally detectable in upper respiratory specimens during the acute phase of infection. Positive results are indicative of the presence of the identified virus, but do not rule out bacterial infection or co-infection with other pathogens not detected by the test. Clinical correlation with patient history and other diagnostic information is necessary to determine patient infection status. The expected result is Negative.  Fact Sheet for Patients: EntrepreneurPulse.com.au  Fact Sheet for Healthcare Providers: IncredibleEmployment.be  This test is not yet approved or cleared by the [place] FDA and  has been authorized for detection and/or diagnosis of SARS-CoV-2 by FDA under an Emergency Use Authorization (EUA).  This EUA  will remain in effect (meaning this test can  be used) for the duration of  the COVID-19 declaration under Section 564(b)(1) of the Act, 21 U.S.C. section 360bbb-3(b)(1), unless the authorization is terminated or revoked sooner.     Influenza A by PCR NEGATIVE NEGATIVE Final   Influenza B by PCR NEGATIVE NEGATIVE Final    Comment: (NOTE) The Xpert Xpress SARS-CoV-2/FLU/RSV plus assay is intended as an aid in the diagnosis of influenza from Nasopharyngeal swab specimens and should not be used as a sole basis for treatment. Nasal washings and aspirates are unacceptable for Xpert Xpress SARS-CoV-2/FLU/RSV testing.  Fact Sheet for Patients: EntrepreneurPulse.com.au  Fact Sheet for Healthcare Providers: IncredibleEmployment.be  This test is not yet approved or cleared by the [place] FDA and has been authorized for detection and/or diagnosis of SARS-CoV-2 by FDA under an Emergency Use Authorization (EUA). This EUA will remain in effect (meaning this test can be used) for the duration of the COVID-19 declaration under Section 564(b)(1) of the Act, 21 U.S.C. section 360bbb-3(b)(1), unless the authorization is terminated or revoked.  Performed at [facility], [place] [address] [place], [place] [address]   Blood Culture (routine x 2)     Status: None   Collection Time: 05/25/20  3:59 PM   Specimen: BLOOD  Result Value Ref Range Status   Specimen Description BLOOD SITE NOT SPECIFIED  Final   Special Requests   Final    BOTTLES DRAWN AEROBIC AND ANAEROBIC Blood Culture results may not be optimal due to an excessive volume of blood received in culture bottles   Culture   Final    NO GROWTH 5 DAYS Performed at [facility], [place] [address] [place], [place] [address]    Report Status 05/30/2020 FINAL  Final  Blood Culture (routine x 2)     Status: None   Collection Time: 05/25/20  6:18 PM   Specimen: BLOOD RIGHT HAND  Result Value  Ref Range Status   Specimen Description BLOOD RIGHT HAND  Final   Special Requests   Final    BOTTLES DRAWN AEROBIC AND ANAEROBIC Blood Culture adequate volume   Culture   Final    NO GROWTH 5 DAYS Performed at [facility], [place] [address] [place], [place] [address]    Report Status 05/30/2020 FINAL  Final  Urine culture     Status: Abnormal   Collection Time: 05/25/20  7:16 PM   Specimen: In/Out Cath Urine  Result Value Ref Range Status   Specimen Description IN/OUT CATH URINE  Final   Special Requests   Final    NONE Performed at [facility], [address] [place], [place] [address]    Culture 20,000 COLONIES/mL STAPHYLOCOCCUS EPIDERMIDIS (A)  Final   Report Status 05/29/2020 FINAL  Final   Organism ID, Bacteria STAPHYLOCOCCUS EPIDERMIDIS (A)  Final      Susceptibility   Staphylococcus epidermidis - MIC*    CIPROFLOXACIN >=8 RESISTANT Resistant     GENTAMICIN 4 SENSITIVE Sensitive     NITROFURANTOIN <=16 SENSITIVE Sensitive     OXACILLIN >=4 RESISTANT Resistant     TETRACYCLINE 2 SENSITIVE Sensitive     VANCOMYCIN 1 SENSITIVE Sensitive     TRIMETH/SULFA 80 RESISTANT Resistant     CLINDAMYCIN <=0.25 SENSITIVE Sensitive     RIFAMPIN <=0.5 SENSITIVE Sensitive     Inducible Clindamycin NEGATIVE Sensitive     * 20,000 COLONIES/mL STAPHYLOCOCCUS EPIDERMIDIS  Expectorated Sputum Assessment w Gram Stain, Rflx to Resp Cult     Status: None   Collection Time: 05/27/20  1:15 PM   Specimen: Expectorated Sputum  Result Value Ref Range Status   Specimen Description EXPECTORATED SPUTUM  Final   Special Requests NONE  Final   Sputum evaluation   Final    Sputum specimen not acceptable for testing.  Please recollect.   RESULT CALLED TO, READ BACK BY AND VERIFIED WITH: A PINCHER RN 1425 05/27/20 A BROWNING Performed at [facility], [place] [address], [place], [place] [address]    Report Status 05/27/2020 FINAL  Final  Acid Fast Smear (AFB)     Status: None    Collection Time: 05/27/20  1:15 PM   Specimen: Sputum  Result Value Ref Range Status   AFB Specimen Processing Concentration  Final   Acid Fast Smear Negative  Final    Comment: (NOTE) Performed At: [facility] [place], [place] [id number] Rush Farmer MD [id number]    Source (AFB) EXPECTORATED SPUTUM  Final    Comment: Performed at [facility], [place] [address] [place], [place] [address]  Culture, fungus without smear     Status: Abnormal (Preliminary result)   Collection Time: 05/27/20  1:15 PM   Specimen: Sputum; Other  Result Value Ref Range Status   Specimen Description EXPECTORATED SPUTUM  Final   Special Requests NONE  Final   Culture (A)  Final    CANDIDA GLABRATA CONTINUING TO HOLD Performed at [facility], [place] [address] [place], [place] [address]    Report Status PENDING  Incomplete     Janene Madeira, MSN, NP-[facility] [email or web address] Pager: [phone] Office: [phone] [place]: [phone]

## 2020-05-31 NOTE — Progress Notes (Signed)
PROGRESS NOTE                                                                                                                                                                                                             Patient Demographics:    Tyrone Roman, is a 75 y.o. male, DOB - [birth year], [id number]  Outpatient Primary MD for the patient is [name], MD   Admit date - 05/25/2020   LOS - 6  Chief Complaint  Patient presents with  . Code Sepsis       Brief Narrative: Patient is a 75 y.o. male with PMHx of RA, CAD s/p PCI, HTN, HLD, COPD, BPH-who had COVID-19 infection in January 2021-since then has had numerous hospitalizations for shortness of breath-FUO-most recently discharged from 4/4-4/13-after extensive work-up including ID evaluation-found to have PE/DVT-maintained on prednisone 30 mg p.o. twice daily (fever responsive to steroids per prior notes)-presenting back to the hospital with 4-5-day history of recurrent fever (as high as 103 F) and worsening shortness of breath.  See below for further details.  Summary of prior Hospitalizations: 1/24 - 1/26>> admit for COVID PNA- received IV steroids/remdesivir-No baricitinib/Actemra due to underling immunosuppressants. Seen with Coag-neg staph bacteremia, ID seen with no reccs for ABT's 2/10 - 2/11>> Readmitted recurrent SOB/hypoxia with substernal CP. Concern that steroid taper was completed to quickly, patient was placed on 10 day taper. 2/18 - 2/25>> Readmitted for recurrent SOB/hypoxia. COVID PCR remained positive. Negative for PE. Received zosyn/vanc with switch to PO Augmentin at d/c. Treated with short steroid taper  2/27 - 3/2>> Readmitted and treated for SOB with fever and AMS. ? Aspiration PNA and received empiric Unasyn and switched to Augmentin on d/c 4/4 - 4/13>> Readmitted from [facility] forhypoxic respiratory failure to be due to acute on chronic  congestive heart failure. COVID PCR positive.He received broad spectrum ABT which were eventually d/c due to no obvious cause.  Had FUO of unknown etiology-with concern for possible vasculities that temporary resolves with higher doses of IV steroids. D/C'd on 30mg  BID prednisone and Eliquis for new acute PE seen on CTA chest  COVID-19 vaccinated status: Vaccinated including booster ( COVID antibodies undetectable x 2 in spite of vaccine/natural infection)  Significant Events: 4/20>> Admit to [facility] for recurrent fever/shortness of breath-significant infiltrates on CT chest-worsening clot burden.  Requiring anywhere from 10-12 L of oxygen. 4/21>> MAB infusion.  Significant studie 4/21>>> Serum SARS-COV-2 antibody-IgG: Negative 4/21>>Serum Fungitell:pending 4/20>> CTA chest: PE extending from right pulmonary artery into lobar/segmental branches, significant bilateral pulmonary infiltrates. 4/21>> B/L lower ext Doppler: Left popliteal- posterior tibial-peroneal vein DVT(Previously B/L DVT on 4/6) 4/21>> Echo: EF [id number], RV systolic function normal. 4/13>> MPO/ANCA: Negative 4/12>> SPEP: No M spike 4/10>> RA factor: 12.9 (WNL) 4/10>> ACE: 34 (WNL) 4/9>> GOLD QuantiFERON: Negative 4/9>> CMV IgG/IgM: Negative 4/9>> EBV IgM: Negative 4/9>> ANA: Negative 4/9>> Serum cryptococcal/histoplasma/blastomycosis antigen: Negative 4/9>> Serum SARS-COV-2 antibody-IgG: Negative 4/6>> bilateral lower extremity Doppler: Bilateral lower extremity DVT 2/27>> Echo: EF 60-65%  COVID-19 medications: Remdesivir: 4/21>> IV Solu-Medrol: 4/21>> Bebtelovimab: 4/21 x 1  Antibiotics: Vancomycin: 4/20 x 1 Flagyl: 4/20>> 4/21 Cefepime: 4/20 x 1  Microbiology data: 4/21>> sputum AFB smear/culture, PCP smear and culture sensitivity: Ordered 4/20 >>blood culture: No growth 4/20>> SARS 2 coronavirus PCR: Positive with cycle threshold of 24.2 4/4>> SARS 2 coronavirus PCR: Positive with cycle threshold of  29.6 2/18>> SARS 2 coronavirus PCR: Positive with cycle threshold of 37.5  Procedures: None  Consults: ID, PCCM  DVT prophylaxis: IV heparin    Subjective:   Patient in bed, appears comfortable, denies any headache, no fever, no chest pain or pressure, no shortness of breath , no abdominal pain. No new focal weakness, refusing SNF.   Assessment  & Plan :   Acute hypoxic respiratory failure: Multifactorial etiology-worsening clot burden/PE-likely ongoing/acute COVID-19 infection-superimposed significant bilateral pulmonary infiltrates (autoimmune/COVID inflammation versus opportunistic infection).  Remains on IV heparin-previously on oral prednisone as outpatient-has been escalated to IV Solu-Medrol-and has been started on Remdesivir.  Continue as needed Lasix with repeat dose of IV Lasix on 05/30/2020,.  ID/PCCM following-some mild improvement in hypoxemia compared to yesterday-Down to 7 L of HFNC on 05/30/20, and now improving.  He has been encouraged to sit up in chair at least 5 to 6 hours a day and use flutter valve for pulmonary toiletry, he has been doing that since 05/29/2020 with good improvement.   Acute vs persistent COVID-19 infection: Continues to test positive for COVID-19 since January-however last 2 tests in April-with significant cycle threshold-that continues to indicate the patient is infectious with Cycle counts < 30.  This is likely due to his immunosuppression from rheumatoid medications that are preventing him to clear his infection.  ID and pulmonary were consulted and he was treated with a course of remdesivir, tapering IV steroids along with 1 dose of monoclonal antibody (after significant discussion with ID/PCCM - Bebtelovimab ).  Note upon discharge will go on oral steroid likely 30 to 40 mg oral daily as there has been suspicion of underlying relative adrenal insufficiency as well.  Bilateral pulmonary infiltrates:  Etiology of these infiltrates not clear seen by  pulmonary and ID, could be COVID.  Sputum for PCP/AFB/bacterial cultures get sent out along with Fungitell assay.  Fungitell assay is negative however is growing Candida glabrata in his sputum, informed ID and pulmonary, defer management to them.  Pulmonary embolism/DVT: Has significant clot burden on CT done on 4/20-in spite of being on Eliquis-lower extremity Doppler on 4/21 only shows left lower extremity DVT-a prior Doppler on 4/6 showed bilateral DVT.  Kept on heparin drip and Lovenox, upon discharge does not want to take shots, will go on Xarelto most like.  FUO: Unclear etiology-ID and pulmonary were consulted, recurrent COVID infection, adrenal insufficiency and some clot burden.  Placed on steroids, COVID-19  treatment, anticoagulant changed as above, fever much improved and now resolved.  All blood and urine cultures negative, sputum growing Candida Glabrata.  ID and pulmonary following, have deferred any antifungal treatment to ID and pulm.  Chronic diastolic heart failure: Euvolemic on exam-await echo-we will keep in negative balance and dose with diuretics periodically.  Hyponatremia: Euvolemic on exam-could have SIADH pathophysiology-improved-continue to watch closely.  Dose with as needed doses of IV Lasix.  HTN: BP is stable-resume antihypertensives over the next few days.  HLD: Continue statin  History of rheumatoid arthritis: Has been on prednisone for the past several months-see above.  Also suspicion of relative adrenal insufficiency.  COPD: Continue inhaler regimen  GERD: Continue PPI  BPH: Continue Flomax  Debility/deconditioning: Due to acute on chronic illness-await PT/OT eval.  Previously had refused SNF.  He is refusing SNF again.  Daughter has been informed.    Condition - Extremely Guarded  Family Communication  :    Cameron-Daughter-[phone]-left a voicemail on 4/21, 4/22, 05/29/20, 05/30/20, 05/31/20  Code Status :  Full Code  Diet :   Diet Order             Diet Carb Modified Fluid consistency: Thin; Room service appropriate? Yes  Diet effective now                  Disposition Plan  :  [facility] transfer center 05/28/20 - Dr L.Wahid, nothing else to offer agrees with management.  Status is: Inpatient  Remains inpatient appropriate because:Inpatient level of care appropriate due to severity of illness   Dispo: The patient is from: Home              Anticipated d/c is to: TBD              Patient currently is not medically stable to d/c.   Difficult to place patient No    Barriers to discharge: Hypoxia requiring O2 supplementation/complete 5 days of IV Remdesivir  Antimicorbials  :    Anti-infectives (From admission, onward)   Start     Dose/Rate Route Frequency Ordered Stop   05/27/20 1000  remdesivir 100 mg in sodium chloride 0.9 % 100 mL IVPB  Status:  Discontinued       "Followed by" Linked Group Details   100 mg 200 mL/hr over 30 Minutes Intravenous Daily 05/26/20 1134 05/26/20 1143   05/27/20 1000  remdesivir 100 mg in sodium chloride 0.9 % 100 mL IVPB       "Followed by" Linked Group Details   100 mg 200 mL/hr over 30 Minutes Intravenous Daily 05/26/20 1143 05/30/20 0956   05/26/20 1300  remdesivir 100 mg in sodium chloride 0.9 % 100 mL IVPB  Status:  Discontinued       "Followed by" Linked Group Details   100 mg 200 mL/hr over 30 Minutes Intravenous  Once 05/26/20 1134 05/26/20 1143   05/26/20 1200  remdesivir 200 mg in sodium chloride 0.9% 250 mL IVPB       "Followed by" Linked Group Details   200 mg 580 mL/hr over 30 Minutes Intravenous Once 05/26/20 1143 05/26/20 1503   05/26/20 0500  vancomycin (VANCOREADY) IVPB 1250 mg/250 mL  Status:  Discontinued        1,250 mg 166.7 mL/hr over 90 Minutes Intravenous Every 12 hours 05/25/20 1748 05/26/20 1108   05/26/20 0000  ceFEPIme (MAXIPIME) 2 g in sodium chloride 0.9 % 100 mL IVPB  Status:  Discontinued  2 g 200 mL/hr over 30 Minutes Intravenous Every 8 hours  05/25/20 1745 05/26/20 1108   05/25/20 2315  metroNIDAZOLE (FLAGYL) IVPB 500 mg  Status:  Discontinued        500 mg 100 mL/hr over 60 Minutes Intravenous Every 8 hours 05/25/20 2223 05/26/20 1108   05/25/20 1600  vancomycin (VANCOREADY) IVPB 1000 mg/200 mL  Status:  Discontinued        1,000 mg 200 mL/hr over 60 Minutes Intravenous  Once 05/25/20 1548 05/25/20 1555   05/25/20 1600  aztreonam (AZACTAM) 2 g in sodium chloride 0.9 % 100 mL IVPB  Status:  Discontinued        2 g 200 mL/hr over 30 Minutes Intravenous  Once 05/25/20 1548 05/25/20 1552   05/25/20 1600  ceFEPIme (MAXIPIME) 2 g in sodium chloride 0.9 % 100 mL IVPB  Status:  Discontinued        2 g 200 mL/hr over 30 Minutes Intravenous Every 8 hours 05/25/20 1555 05/25/20 1557   05/25/20 1600  vancomycin (VANCOREADY) IVPB 2000 mg/400 mL        2,000 mg 200 mL/hr over 120 Minutes Intravenous  Once 05/25/20 1557 05/25/20 1918   05/25/20 1600  ceFEPIme (MAXIPIME) 2 g in sodium chloride 0.9 % 100 mL IVPB        2 g 200 mL/hr over 30 Minutes Intravenous  Once 05/25/20 1557 05/25/20 1647      Inpatient Medications  Scheduled Meds: . enoxaparin (LOVENOX) injection  90 mg Subcutaneous Q12H  . insulin aspart  0-20 Units Subcutaneous TID WC  . insulin aspart  0-5 Units Subcutaneous QHS  . insulin aspart  3 Units Subcutaneous TID WC  . [START ON 06/01/2020] insulin glargine  35 Units Subcutaneous Daily  . [START ON 06/01/2020] methylPREDNISolone (SOLU-MEDROL) injection  40 mg Intravenous Daily  . metoprolol tartrate  50 mg Oral BID  . mometasone-formoterol  2 puff Inhalation BID  . pantoprazole  40 mg Oral Daily  . simvastatin  20 mg Oral Daily  . tamsulosin  0.4 mg Oral Daily  . umeclidinium bromide  1 puff Inhalation Daily   Continuous Infusions: . sodium chloride    . famotidine (PEPCID) IV     PRN Meds:.sodium chloride, acetaminophen **OR** [DISCONTINUED] acetaminophen, albuterol, diphenhydrAMINE, EPINEPHrine, famotidine  (PEPCID) IV, melatonin   Time Spent in minutes  45  See all Orders from today for further details   Lala Lund M.D on 05/31/2020 at 9:30 AM  To page go to [email or web address] - use universal password  [facility] -  Office  [phone]    Objective:   Vitals:   05/30/20 2210 05/31/20 0352 05/31/20 0646 05/31/20 0908  BP: 105/81 104/81  105/83  Pulse: 79 71 77 88  Resp: 20 20 20 17   Temp: (!) 97.5 F (36.4 C) 97.6 F (36.4 C)  (!) 97.4 F (36.3 C)  TempSrc: Oral Oral  Oral  SpO2: 93% 90% 96% 93%  Weight:      Height:        Wt Readings from Last 3 Encounters:  05/25/20 88 kg  05/09/20 88 kg  05/05/20 88.5 kg     Intake/Output Summary (Last 24 hours) at 05/31/2020 0930 Last data filed at 05/31/2020 0900 Gross per 24 hour  Intake 1324.9 ml  Output 5000 ml  Net -3675.1 ml     Physical Exam  Awake Alert, No new F.N deficits, Normal affect [place].AT,PERRAL Supple Neck,No JVD, No cervical lymphadenopathy appriciated.  Symmetrical  Chest wall movement, Good air movement bilaterally, CTAB RRR,No Gallops, Rubs or new Murmurs, No Parasternal Heave +ve B.Sounds, Abd Soft, No tenderness, No organomegaly appriciated, No rebound - guarding or rigidity. No Cyanosis, Clubbing or edema, No new Rash or bruise    Data Review:   Recent Labs  Lab 05/25/20 1547 05/25/20 1626 05/27/20 0054 05/28/20 0047 05/29/20 0608 05/30/20 0209 05/31/20 0227  WBC 11.6*   < > 8.0 10.4 12.8* 13.7* 14.6*  HGB 11.5*   < > 12.6* 10.7* 10.8* 12.0* 13.1  HCT 36.6*   < > 38.1* 33.2* 33.7* 37.3* 40.4  PLT 155   < > 203 PLATELET CLUMPS NOTED ON SMEAR, UNABLE TO ESTIMATE 113* 139* 174  MCV 89.3   < > 95.3 89.0 88.5 87.4 87.1  MCH 28.0   < > 31.5 28.7 28.3 28.1 28.2  MCHC 31.4   < > 33.1 32.2 32.0 32.2 32.4  RDW 20.5*   < > 12.0 20.0* 19.6* 19.5* 19.9*  LYMPHSABS 0.2*  --   --   --   --   --   --   MONOABS 0.2  --   --   --   --   --   --   EOSABS 0.0  --   --   --   --   --   --    BASOSABS 0.0  --   --   --   --   --   --    < > = values in this interval not displayed.    Recent Labs  Lab 05/25/20 1547 05/25/20 1547 05/25/20 1556 05/25/20 1626 05/25/20 2231 05/26/20 0141 05/27/20 0054 05/28/20 0047 05/28/20 0300 05/29/20 0608 05/30/20 0209 05/31/20 0227  NA 129*  --   --    < >  --   --  133* 130*  --  133* 134* 132*  K 4.6  --   --    < >  --   --  4.8 4.7  --  4.4 3.7 4.3  CL 98  --   --    < >  --   --  99 97*  --  99 100 97*  CO2 21*  --   --   --   --   --  28 21*  --  27 23 25   GLUCOSE 225*  --   --    < >  --   --  110* 554*  --  265* 162* 88  BUN 21  --   --    < >  --   --  16 24*  --  30* 29* 39*  CREATININE 0.90  --   --    < >  --   --  0.80 1.09  --  0.76 0.90 1.04  CALCIUM 9.5  --   --   --   --   --  8.9 9.0  --  8.9 9.5 9.5  AST 29  --   --   --   --   --  34 29  --  24 28 46*  ALT 27  --   --   --   --   --  27 24  --  28 34 50*  ALKPHOS 76  --   --   --   --   --  58 82  --  71 79 82  BILITOT 0.8  --   --   --   --   --  0.8 0.3  --  0.5 0.5 1.1  ALBUMIN 2.3*  --   --   --   --   --  3.3* 2.2*  --  2.2* 2.4* 2.5*  MG  --   --   --   --  1.7  --   --   --  2.0 1.8 1.9 2.0  CRP  --    < >  --   --   --  5.5* 3.9* 4.4*  --  1.5* 1.1* 1.2*  DDIMER  --   --   --   --   --  1.22*  --   --   --   --   --   --   PROCALCITON  --   --   --   --  <0.10  --   --   --   --   --   --   --   LATICACIDVEN  --   --  3.1*  --  2.6*  --   --   --   --   --   --   --   INR 1.7*  --   --   --   --   --   --   --   --   --   --   --   BNP  --   --   --   --   --   --  212.1*  --  219.5* 503.2* 1,922.0* 325.0*   < > = values in this interval not displayed.      Coagulation profile Recent Labs  Lab 05/25/20 1547  INR 1.7*    No results for input(s): DDIMER in the last 72 hours.  Cardiac Enzymes No results for input(s): CKMB, TROPONINI, MYOGLOBIN in the last 168 hours.  Invalid input(s):  CK ------------------------------------------------------------------------------------------------------------------    Component Value Date/Time   BNP 325.0 (H) 05/31/2020 [id number]    Micro Results Recent Results (from the past 240 hour(s))  Resp Panel by RT-PCR (Flu A&B, Covid) Nasopharyngeal Swab     Status: Abnormal   Collection Time: 05/25/20  3:56 PM   Specimen: Nasopharyngeal Swab; Nasopharyngeal(NP) swabs in vial transport medium  Result Value Ref Range Status   SARS Coronavirus 2 by RT PCR POSITIVE (A) NEGATIVE Final    Comment: RESULT CALLED TO, READ BACK BY AND VERIFIED WITH: Sherlean Foot RN 2032 05/25/20 A BROWNING (NOTE) SARS-CoV-2 target nucleic acids are DETECTED.  The SARS-CoV-2 RNA is generally detectable in upper respiratory specimens during the acute phase of infection. Positive results are indicative of the presence of the identified virus, but do not rule out bacterial infection or co-infection with other pathogens not detected by the test. Clinical correlation with patient history and other diagnostic information is necessary to determine patient infection status. The expected result is Negative.  Fact Sheet for Patients: EntrepreneurPulse.com.au  Fact Sheet for Healthcare Providers: IncredibleEmployment.be  This test is not yet approved or cleared by the [place] FDA and  has been authorized for detection and/or diagnosis of SARS-CoV-2 by FDA under an Emergency Use Authorization (EUA).  This EUA will remain in effect (meaning this test can  be used) for the duration of  the COVID-19 declaration under Section 564(b)(1) of the Act, 21 U.S.C. section 360bbb-3(b)(1), unless the authorization is terminated or revoked sooner.     Influenza A by PCR NEGATIVE NEGATIVE Final   Influenza B by PCR NEGATIVE NEGATIVE Final  Comment: (NOTE) The Xpert Xpress SARS-CoV-2/FLU/RSV plus assay is intended as an aid in the diagnosis of  influenza from Nasopharyngeal swab specimens and should not be used as a sole basis for treatment. Nasal washings and aspirates are unacceptable for Xpert Xpress SARS-CoV-2/FLU/RSV testing.  Fact Sheet for Patients: EntrepreneurPulse.com.au  Fact Sheet for Healthcare Providers: IncredibleEmployment.be  This test is not yet approved or cleared by the [place] FDA and has been authorized for detection and/or diagnosis of SARS-CoV-2 by FDA under an Emergency Use Authorization (EUA). This EUA will remain in effect (meaning this test can be used) for the duration of the COVID-19 declaration under Section 564(b)(1) of the Act, 21 U.S.C. section 360bbb-3(b)(1), unless the authorization is terminated or revoked.  Performed at [facility], [place] [address], [place], [place] [address]   Blood Culture (routine x 2)     Status: None   Collection Time: 05/25/20  3:59 PM   Specimen: BLOOD  Result Value Ref Range Status   Specimen Description BLOOD SITE NOT SPECIFIED  Final   Special Requests   Final    BOTTLES DRAWN AEROBIC AND ANAEROBIC Blood Culture results may not be optimal due to an excessive volume of blood received in culture bottles   Culture   Final    NO GROWTH 5 DAYS Performed at [facility], [place] [address], [place], [place] [address]    Report Status 05/30/2020 FINAL  Final  Blood Culture (routine x 2)     Status: None   Collection Time: 05/25/20  6:18 PM   Specimen: BLOOD RIGHT HAND  Result Value Ref Range Status   Specimen Description BLOOD RIGHT HAND  Final   Special Requests   Final    BOTTLES DRAWN AEROBIC AND ANAEROBIC Blood Culture adequate volume   Culture   Final    NO GROWTH 5 DAYS Performed at [facility], [place] [address] [place], [place] [address]    Report Status 05/30/2020 FINAL  Final  Urine culture     Status: Abnormal   Collection Time: 05/25/20  7:16 PM   Specimen: In/Out Cath Urine  Result  Value Ref Range Status   Specimen Description IN/OUT CATH URINE  Final   Special Requests   Final    NONE Performed at [facility], [place] [address], [place], [place] [address]    Culture 20,000 COLONIES/mL STAPHYLOCOCCUS EPIDERMIDIS (A)  Final   Report Status 05/29/2020 FINAL  Final   Organism ID, Bacteria STAPHYLOCOCCUS EPIDERMIDIS (A)  Final      Susceptibility   Staphylococcus epidermidis - MIC*    CIPROFLOXACIN >=8 RESISTANT Resistant     GENTAMICIN 4 SENSITIVE Sensitive     NITROFURANTOIN <=16 SENSITIVE Sensitive     OXACILLIN >=4 RESISTANT Resistant     TETRACYCLINE 2 SENSITIVE Sensitive     VANCOMYCIN 1 SENSITIVE Sensitive     TRIMETH/SULFA 80 RESISTANT Resistant     CLINDAMYCIN <=0.25 SENSITIVE Sensitive     RIFAMPIN <=0.5 SENSITIVE Sensitive     Inducible Clindamycin NEGATIVE Sensitive     * 20,000 COLONIES/mL STAPHYLOCOCCUS EPIDERMIDIS  Expectorated Sputum Assessment w Gram Stain, Rflx to Resp Cult     Status: None   Collection Time: 05/27/20  1:15 PM   Specimen: Expectorated Sputum  Result Value Ref Range Status   Specimen Description EXPECTORATED SPUTUM  Final   Special Requests NONE  Final   Sputum evaluation   Final    Sputum specimen not  acceptable for testing.  Please recollect.   RESULT CALLED TO, READ BACK BY AND VERIFIED WITH: A PINCHER RN 1425 05/27/20 A BROWNING Performed at [facility], [place] [address], [place], [place] [address]    Report Status 05/27/2020 FINAL  Final  Acid Fast Smear (AFB)     Status: None   Collection Time: 05/27/20  1:15 PM   Specimen: Sputum  Result Value Ref Range Status   AFB Specimen Processing Concentration  Final   Acid Fast Smear Negative  Final    Comment: (NOTE) Performed At: [facility] [place], [place] [id number] Rush Farmer MD [id number]    Source (AFB) EXPECTORATED SPUTUM  Final    Comment: Performed at [facility], [place] [address] [place], [place] [address]   Culture, fungus without smear     Status: Abnormal (Preliminary result)   Collection Time: 05/27/20  1:15 PM   Specimen: Sputum; Other  Result Value Ref Range Status   Specimen Description EXPECTORATED SPUTUM  Final   Special Requests NONE  Final   Culture (A)  Final    CANDIDA GLABRATA CONTINUING TO HOLD Performed at [facility], [place] [address], [place], [place] [address]    Report Status PENDING  Incomplete    Radiology Reports CT Angio Chest PE W and/or Wo Contrast  Result Date: 05/25/2020 CLINICAL DATA:  75 year old male with shortness of breath and chest pain. Concern for pulmonary embolism. EXAM: CT ANGIOGRAPHY CHEST WITH CONTRAST TECHNIQUE: Multidetector CT imaging of the chest was performed using the standard protocol during bolus administration of intravenous contrast. Multiplanar CT image reconstructions and MIPs were obtained to evaluate the vascular anatomy. CONTRAST:  45mL OMNIPAQUE IOHEXOL 350 MG/ML SOLN COMPARISON:  Chest CT dated 05/14/2020. FINDINGS: Cardiovascular: Mild cardiomegaly. No pericardial effusion. There is coronary vascular calcification. Mild atherosclerotic calcification of the thoracic aorta. Linear nonocclusive thrombus noted extending from the central right pulmonary artery into the lobar and segmental branches of the right lower lobe and right upper lobe. This is new since the prior CT. No CT evidence of right heart straining. Mediastinum/Nodes: Top-normal right hilar lymph nodes. The esophagus and the thyroid gland are grossly unremarkable. No mediastinal fluid collection. Lungs/Pleura: Bilateral patchy and streaky densities similar to prior CT. Calcified pleural plaque noted in the left upper lobe anteriorly. No pleural effusion pneumothorax. The central airways are patent. Upper Abdomen: No acute abnormality. Musculoskeletal: No chest wall abnormality. No acute or significant osseous findings. Review of the MIP images confirms the above findings.  IMPRESSION: 1. Pulmonary artery embolus extending from the central right pulmonary artery into the lobar and segmental branches of the right lower lobe and right upper lobe, new since the prior CT. No CT evidence of right heart straining. 2. Bilateral patchy and streaky densities similar to prior CT. 3. Aortic Atherosclerosis (ICD10-I70.0). These results were called by telephone at the time of interpretation on 05/25/2020 at 6:39 pm to provider DAVID YAO , who verbally acknowledged these results. Electronically Signed   By: Anner Crete M.D.   On: 05/25/2020 18:43   CT ANGIO CHEST PE W OR WO CONTRAST  Result Date: 05/14/2020 CLINICAL DATA:  High probability sepsis due to pneumonia EXAM: CT ANGIOGRAPHY CHEST WITH CONTRAST TECHNIQUE: Multidetector CT imaging of the chest was performed using the standard protocol during bolus administration of intravenous contrast. Multiplanar CT image reconstructions and MIPs were obtained to evaluate the vascular anatomy. CONTRAST:  44mL OMNIPAQUE IOHEXOL 350 MG/ML SOLN  COMPARISON:  04/02/2020 FINDINGS: Cardiovascular: Satisfactory opacification by motion of pulmonary artifact. Arteries although limited there is a convincing branching pulmonary artery filling defect affecting segmental vessels in the basal right lower lobe as marked on series 6. Additional peripheral emboli could be present and obscured by the degree of motion artifact. No central embolism is seen. No evidence of right heart strain. Heart size is normal. No pericardial effusion. Atherosclerosis of the coronaries. Mediastinum/Nodes: Negative for adenopathy or mass. Lungs/Pleura: Chronic lung disease with reticulation. Bilateral pleural plaque appearance without calcification. There is increased ground-glass density scattered in the bilateral lungs, greatest in the right upper lobe. No interlobular septal thickening or pleural fluid to implicate failure. Mild centrilobular emphysema. Upper Abdomen: Negative  Musculoskeletal: No unexpected finding Review of the MIP images confirms the above findings. Critical Value/emergent results were called by telephone at the time of interpretation on 05/14/2020 at 8:35 am to provider Elgergawy, who verbally acknowledged these results. IMPRESSION: 1. Positive for segmental pulmonary embolism in the right lower lobe. Additional pulmonary emboli could be obscured by the degree of motion artifact. 2. Background of chronic opacities related to recent COVID infection. There is also new ground-glass opacity especially in the right upper lobe suggesting a superimposed acute pneumonia. 3. Aortic Atherosclerosis (ICD10-I70.0).  Coronary atherosclerosis. Electronically Signed   By: Marnee SpringJonathon  Watts M.D.   On: 05/14/2020 08:35   MR BRAIN W WO CONTRAST  Result Date: 05/16/2020 CLINICAL DATA:  Delirium; fever with unknown origin, altered mental status, evaluate for encephalitis. EXAM: MRI HEAD WITHOUT AND WITH CONTRAST TECHNIQUE: Multiplanar, multiecho pulse sequences of the brain and surrounding structures were obtained without and with intravenous contrast. CONTRAST:  8mL GADAVIST GADOBUTROL 1 MMOL/ML IV SOLN COMPARISON:  Prior head CT examinations 04/02/2020 and earlier. Brain MRI 12/13/2017. FINDINGS: Brain: Mild cerebral and cerebellar atrophy. AP punctate focus of restricted diffusion is questioned within the right parietal lobe (versus artifact) (series 3, image 33). Additionally, there is apparent subtle restricted diffusion along the midline callosal splenium, slightly eccentric to the left. A few small foci of T2/FLAIR hyperintensity within the bilateral cerebral white matter and right thalamus are nonspecific, but likely reflect minimal chronic small vessel ischemic disease. Redemonstrated left retrocerebellar arachnoid cyst. There is no acute infarct. No evidence of intracranial mass. No chronic intracranial blood products. No extra-axial fluid collection. No midline shift. No  abnormal intracranial enhancement. Vascular: Expected proximal arterial flow voids. Skull and upper cervical spine: No focal marrow lesion. Incompletely assessed cervical spondylosis. Suspected C4-C5 vertebral body ankylosis. Sinuses/Orbits: Visualized orbits show no acute finding. Bilateral lens replacements. Mild paranasal sinus mucosal thickening, most notably within the bilateral ethmoid air cells. Small left maxillary sinus mucous retention cyst. Other: Right mastoid effusion. IMPRESSION: Punctate acute infarct versus artifact within the right parietal lobe. Additionally, there is apparent subtle restricted diffusion within the midline callosal splenium, slightly eccentric to the left. This may reflect a small focus of acute ischemia or artifact. Alternatively, this may reflect a cytotoxic lesion of the corpus callosum (which has a broad range of potential etiologies and clinical associations), although the appearance would be atypical for this entity. Background mild parenchymal atrophy and chronic small vessel ischemic disease. Mild paranasal sinus disease as described. Right mastoid effusion. Electronically Signed   By: Jackey LogeKyle  Golden DO   On: 05/16/2020 18:51   MR CERVICAL SPINE W WO CONTRAST  Result Date: 05/17/2020 CLINICAL DATA:  Initial screening for possible epidural abscess. EXAM: MRI CERVICAL SPINE WITHOUT AND WITH CONTRAST TECHNIQUE: Multiplanar  and multiecho pulse sequences of the cervical spine, to include the craniocervical junction and cervicothoracic junction, were obtained without and with intravenous contrast. CONTRAST:  66mL GADAVIST GADOBUTROL 1 MMOL/ML IV SOLN COMPARISON:  Prior MRI from 03/24/2010. FINDINGS: Alignment: Reversal of the normal cervical lordosis with apex at C3-4. Trace anterolisthesis of C3 on C4 and C7 on T1, chronic and facet mediated. Vertebrae: Vertebral body height maintained without acute or chronic fracture. C4 and C5 vertebral bodies are largely ankylosed, likely  degenerative. Bone marrow signal intensity within normal limits. No discrete or worrisome osseous lesions. No abnormal marrow edema or enhancement. No findings to suggest osteomyelitis discitis or septic arthritis. Cord: Normal signal and morphology. No epidural abscess or other collection. Posterior Fossa, vertebral arteries, paraspinal tissues: Probable benign retro cerebellar arachnoid cyst noted. Visualized brain and posterior fossa otherwise unremarkable. Craniocervical junction within normal limits. Paraspinous and prevertebral soft tissues normal. Normal flow voids seen within the vertebral arteries bilaterally. Disc levels: C2-C3: Negative interspace. Right worse than left facet hypertrophy. No spinal stenosis. Mild right C3 foraminal narrowing. Left neural foramina remains patent. C3-C4: Broad-based posterior disc osteophyte flattens and effaces the ventral thecal sac. Mild flattening of the ventral cord without cord signal changes. Mild spinal stenosis. Superimposed bilateral facet hypertrophy with left greater than right uncovertebral spurring. Severe left with moderate right C4 foraminal stenosis. C4-C5: C4 and C5 vertebral bodies are largely ankylosed. Broad posterior endplate osseous ridging flattens and effaces the ventral thecal sac. Mild spinal stenosis without cord impingement. Moderate right worse than left C5 foraminal stenosis. C5-C6: Degenerative intervertebral disc space narrowing with diffuse disc osteophyte complex. Mild flattening of the ventral thecal sac without significant spinal stenosis. Moderate left worse than right C6 foraminal narrowing. C6-C7: Degenerative intervertebral disc space narrowing with diffuse disc osteophyte complex. No significant spinal stenosis. Severe left with moderate right C7 foraminal stenosis. C7-T1: Trace anterolisthesis. No significant disc bulge. Moderate bilateral facet and ligament flavum hypertrophy. No significant spinal stenosis. Foramina remain  patent. IMPRESSION: 1. No evidence for osteomyelitis discitis or septic arthritis within the cervical spine. No epidural abscess or other infection. 2. Multilevel cervical spondylosis with resultant mild spinal stenosis at C3-4 and C4-5. 3. Multifactorial degenerative changes with resultant multilevel foraminal narrowing as above. Notable findings include severe left with moderate right C4 foraminal stenosis, moderate bilateral C5 and C6 foraminal narrowing, with severe left and moderate right C7 foraminal stenosis. Electronically Signed   By: Rise Mu M.D.   On: 05/17/2020 21:40   MR THORACIC SPINE W WO CONTRAST  Result Date: 05/17/2020 CLINICAL DATA:  Initial screening for possible infection, epidural abscess. EXAM: MRI THORACIC WITHOUT AND WITH CONTRAST TECHNIQUE: Multiplanar and multiecho pulse sequences of the thoracic spine were obtained without and with intravenous contrast. CONTRAST:  58mL GADAVIST GADOBUTROL 1 MMOL/ML IV SOLN COMPARISON:  None available. FINDINGS: Alignment: Straightening of the normal thoracic kyphosis. No listhesis. Vertebrae: Vertebral body height maintained without acute or chronic fracture. Bone marrow signal intensity within normal limits. Few scattered benign hemangiomata noted. No worrisome osseous lesions. No abnormal marrow edema or enhancement. No findings to suggest osteomyelitis discitis or septic arthritis. Cord: Normal signal and morphology. No epidural abscess or other collection. No abnormal enhancement. Paraspinal and other soft tissues: Paraspinous soft tissues demonstrate no acute finding. Small layering bilateral pleural effusions noted, left slightly larger than right. T2 hyperintense benign appearing cyst partially visualized extending from the right kidney. Visualized visceral structures otherwise grossly unremarkable. Disc levels: T1-2:  Unremarkable. T2-3: Tiny right  paracentral disc extrusion with superior migration. Mild right-sided facet  hypertrophy. No spinal stenosis. Foramina remain patent. T3-4: Minimal disc bulge. No spinal stenosis. Foramina remain patent. T4-5: Normal interspace. Mild right-sided facet hypertrophy. No stenosis. T5-6:  Normal interspace.  Mild facet hypertrophy.  No stenosis. T6-7:  Unremarkable. T7-8: Mild degenerative intervertebral disc space narrowing with disc bulge. No spinal stenosis. Foramina remain patent. T8-9: Mild disc bulge. No spinal stenosis. Foramina remain patent. T9-10: Mild disc bulge with posterior element hypertrophy. No significant spinal stenosis. Mild bilateral foraminal narrowing. T10-11: Diffuse disc bulge, asymmetric to the right. Moderate facet and ligament flavum hypertrophy. Resultant mild to moderate spinal stenosis with mild cord flattening. No cord signal changes. Moderate bilateral foraminal narrowing. T11-12: Negative interspace. Bilateral facet hypertrophy. No significant spinal stenosis. Mild bilateral foraminal narrowing. T12-L1: Disc desiccation with small endplate Schmorl's node deformities. No stenosis. IMPRESSION: 1. No MRI evidence for osteomyelitis discitis or septic arthritis within the thoracic spine. No epidural abscess or other infection. 2. Disc bulging with facet hypertrophy at T10-11 with resultant mild to moderate spinal stenosis and moderate bilateral foraminal narrowing. 3. Additional mild spondylosis elsewhere within the thoracic spine without significant stenosis. 4. Small layering bilateral pleural effusions, left slightly larger than right. Electronically Signed   By: Jeannine Boga M.D.   On: 05/17/2020 21:52   MR Lumbar Spine W Wo Contrast  Result Date: 05/13/2020 CLINICAL DATA:  Fever, lumbar spine tenderness EXAM: MRI LUMBAR SPINE WITHOUT AND WITH CONTRAST TECHNIQUE: Multiplanar and multiecho pulse sequences of the lumbar spine were obtained without and with intravenous contrast. CONTRAST:  32mL GADAVIST GADOBUTROL 1 MMOL/ML IV SOLN COMPARISON:  04/03/2018  FINDINGS: Segmentation:  Standard. Alignment:  Stable. Vertebrae: There is now fusion across the L1-L2 disc space. There is no new marrow edema or enhancement. Multilevel degenerative plate irregularity and Schmorl's nodes. Conus medullaris and cauda equina: Conus extends to the L1-L2 level. Conus and cauda equina appear normal. No intrathecal enhancement. Paraspinal and other soft tissues: Mild lower lumbar paraspinal edema. No evidence of collection. Disc levels: L1-L2: Prior right laminectomy. Stable retrolisthesis with bridging endplate osteophytes effacing the ventral thecal sac. Similar mild to moderate foraminal stenosis. L2-L3: Disc bulge. Facet arthropathy with ligamentum flavum infolding. Slightly increased mild canal stenosis. Similar mild foraminal stenosis. L3-L4: Disc bulge. Facet arthropathy with ligamentum flavum infolding. Similar moderate canal stenosis with effacement of subarticular recesses. Similar mild to moderate left greater than right foraminal stenosis. L4-L5: Disc bulge with superimposed left subarticular/foraminal protrusion. Facet arthropathy with ligamentum flavum infolding. Similar mild canal stenosis. Similar partial effacement of the left subarticular recess. Similar mild foraminal stenosis. L5-S1: Disc bulge with endplate osteophytic ridging. Facet arthropathy. No canal stenosis. Similar mild foraminal stenosis. IMPRESSION: No evidence of discitis/osteomyelitis. Multilevel degenerative changes as detailed above without substantial change since the prior examination. Electronically Signed   By: Macy Mis M.D.   On: 05/13/2020 14:25   CT ABDOMEN PELVIS W CONTRAST  Result Date: 05/15/2020 CLINICAL DATA:  Sepsis.  Fever of unknown origin. EXAM: CT ABDOMEN AND PELVIS WITH CONTRAST TECHNIQUE: Multidetector CT imaging of the abdomen and pelvis was performed using the standard protocol following bolus administration of intravenous contrast. CONTRAST:  135mL OMNIPAQUE IOHEXOL 300  MG/ML  SOLN COMPARISON:  04/02/2020.  Chest CTA obtained earlier today. FINDINGS: Lower chest: Progressive pulmonary embolism on the right, with emboli currently demonstrated in the distal right main pulmonary artery and significantly more clot in the intersegmental and proximal right lower lobe pulmonary arteries. Bilateral lung  densities and atelectasis with improvement since this morning. Hepatobiliary: Diffuse low density of the liver relative to the spleen. Normal appearing gallbladder. Pancreas: Unremarkable. No pancreatic ductal dilatation or surrounding inflammatory changes. Spleen: Normal in size without focal abnormality. Adrenals/Urinary Tract: Normal appearing adrenal glands. Bilateral renal cysts. Multiple small bilateral renal calculi. No bladder or ureteral calculi are seen. Excreted contrast in the urinary bladder from the CTA earlier today. Stomach/Bowel: Multiple colonic diverticula without evidence of diverticulitis. Unremarkable stomach, small bowel and appendix. Vascular/Lymphatic: Atheromatous arterial calcifications without aneurysm. No enlarged lymph nodes. Two renal arteries on the left. Reproductive: Mildly enlarged prostate gland. Other: Small bilateral inguinal hernias containing fat and very small umbilical artery containing fat. Musculoskeletal: Stable lumbar and lower thoracic spine degenerative changes and fusion of the L1 and L2 vertebral bodies. IMPRESSION: 1. Progressive pulmonary embolism on the right, with emboli currently demonstrated in the distal right main pulmonary artery and significantly more clot in the intersegmental and proximal right lower lobe pulmonary arteries. These results will be called to the ordering clinician or representative by the Radiologist Assistant, and communication documented in the PACS or [employer]. 2. Lung densities and atelectasis with improvement since this morning. 3. Diffuse hepatic steatosis. 4. Multiple small, nonobstructing bilateral  renal calculi. 5. Colonic diverticulosis. Electronically Signed   By: Claudie Revering M.D.   On: 05/15/2020 22:17   VAS [place] IVC/ILIAC (VENOUS ONLY)  Result Date: 05/11/2020 IVC/ILIAC STUDY Indications: DVT, recent COVID Limitations: Air/bowel gas.  Comparison Study: No prior study Performing Technologist: Maudry Mayhew MHA, RDMS, RVT, RDCS  Examination Guidelines: A complete evaluation includes B-mode imaging, spectral Doppler, color Doppler, and power Doppler as needed of all accessible portions of each vessel. Bilateral testing is considered an integral part of a complete examination. Limited examinations for reoccurring indications may be performed as noted.  IVC/Iliac Findings: +----------+------+--------+--------+    IVC    PatentThrombusComments +----------+------+--------+--------+ IVC Mid   patent                 +----------+------+--------+--------+ IVC Distalpatent                 +----------+------+--------+--------+  +-------------------+---------+-----------+---------+-----------+--------+         CIV        RT-PatentRT-ThrombusLT-PatentLT-ThrombusComments +-------------------+---------+-----------+---------+-----------+--------+ Common Iliac Prox                       patent                      +-------------------+---------+-----------+---------+-----------+--------+ Common Iliac Mid                        patent                      +-------------------+---------+-----------+---------+-----------+--------+ Common Iliac Distal patent              patent                      +-------------------+---------+-----------+---------+-----------+--------+  +-------------------------+---------+-----------+---------+-----------+--------+            EIV           RT-PatentRT-ThrombusLT-PatentLT-ThrombusComments +-------------------------+---------+-----------+---------+-----------+--------+ External Iliac Vein Prox                      patent                       +-------------------------+---------+-----------+---------+-----------+--------+  External Iliac Vein       patent                                          Distal                                                                    +-------------------------+---------+-----------+---------+-----------+--------+   Summary: IVC/Iliac: No evidence of thrombus in IVC and Iliac veins.  *See table(s) above for measurements and observations.  Electronically signed by Harold Barban MD on 05/11/2020 at 9:55:50 PM.    Final    DG Chest Port 1 View  Result Date: 05/30/2020 CLINICAL DATA:  Difficulty breathing, low O2 sats, history of COVID infection. EXAM: PORTABLE CHEST 1 VIEW COMPARISON:  05/29/2020 and CT chest 05/25/2020. FINDINGS: Heart size stable. Patchy mixed interstitial and airspace opacification bilaterally, increased from 05/29/2020. No pleural fluid. IMPRESSION: Worsening COVID-19 pneumonia. Electronically Signed   By: Lorin Picket M.D.   On: 05/30/2020 09:11   DG Chest Port 1 View  Result Date: 05/29/2020 CLINICAL DATA:  Shortness of breath.  Covid-19 viral infection. EXAM: PORTABLE CHEST 1 VIEW COMPARISON:  05/28/2020 FINDINGS: Heart size remains within normal limits. Diffuse interstitial infiltrates are again seen with mild patchy airspace opacities in the left lung base. These show no significant change since previous study. No evidence of pleural effusion. IMPRESSION: No significant change in diffuse interstitial infiltrates with mild patchy airspace disease in left lower lung. Electronically Signed   By: Marlaine Hind M.D.   On: 05/29/2020 08:36   DG Chest Port 1 View  Result Date: 05/28/2020 CLINICAL DATA:  Shortness of breath EXAM: PORTABLE CHEST 1 VIEW COMPARISON:  Three days ago FINDINGS: Patchy bilateral pulmonary infiltrate without convincing progression. Lung volumes are low. No visible effusion or air leak. Normal heart size and stable mediastinal contours.  IMPRESSION: Similar degree of bilateral pulmonary infiltrates. No new abnormality. Electronically Signed   By: Monte Fantasia M.D.   On: 05/28/2020 07:38   DG Chest Port 1 View  Result Date: 05/25/2020 CLINICAL DATA:  75 year old male with concern for sepsis. EXAM: PORTABLE CHEST 1 VIEW COMPARISON:  Chest CT dated 05/14/2020. FINDINGS: Diffuse bilateral streaky interstitial densities worsened since 05/09/2020 but relatively similar to 05/10/2020 concerning for residual infiltrate, likely on the background of chronic changes. Clinical correlation and follow-up recommended. No lobar consolidation, pleural effusion, pneumothorax. The cardiac silhouette is within limits. No acute osseous pathology. IMPRESSION: Persistent bilateral infiltrates. Electronically Signed   By: Anner Crete M.D.   On: 05/25/2020 16:24   DG Chest Port 1 View  Result Date: 05/10/2020 CLINICAL DATA:  Short of breath, history of COPD EXAM: PORTABLE CHEST 1 VIEW COMPARISON:  Prior chest x-ray dated yesterday FINDINGS: Stable cardiac and mediastinal contours. Marked interval increase in diffuse interstitial prominence bilaterally now with Kerley B-lines in the periphery. Findings are most consistent with worsening CHF. No large effusion or pneumothorax. No acute osseous abnormality. IMPRESSION: Increasing interstitial pulmonary edema concerning for progressive CHF. Electronically Signed   By: Jacqulynn Cadet M.D.   On: 05/10/2020 07:54   DG Chest Port 1 View  Result Date: 05/09/2020 CLINICAL  DATA:  Possible sepsis Fever and cough EXAM: PORTABLE CHEST 1 VIEW COMPARISON:  04/04/2020 FINDINGS: Heart size within normal limits. No pulmonary vascular congestion. Interval improvement in aeration of the lungs with bilateral airspace opacities still remaining. IMPRESSION: Interval improvement in aeration of the lungs with bilateral opacities still remaining consistent with resolving pneumonia. Electronically Signed   By: Miachel Roux M.D.    On: 05/09/2020 12:56   ECHOCARDIOGRAM COMPLETE  Result Date: 05/26/2020    ECHOCARDIOGRAM REPORT   Patient Name:   LOUI ROLNICK Date of Exam: 05/26/2020 Medical Rec #:  [id number]      Height:       74.0 in Accession #:    [id number]     Weight:       194.0 lb Date of Birth:  Jan 15, 1946      BSA:          2.146 m Patient Age:    32 years       BP:           129/92 mmHg Patient Gender: M              HR:           84 bpm. Exam Location:  Inpatient Procedure: 2D Echo, Cardiac Doppler, Color Doppler and Intracardiac            Opacification Agent Indications:    Pulmonary Embolus I26.09  History:        Patient has prior history of Echocardiogram examinations, most                 recent 04/03/2020. CAD and Previous Myocardial Infarction, COPD,                 Signs/Symptoms:Shortness of Breath; Risk Factors:Hypertension                 and Dyslipidemia. COVID 19. Sepsis, due to unspecified organism,                 unspecified whether acute organ dysfunction present                 Multiple subsegmental pulmonary emboli without acute cor                 pulmonale.  Sonographer:    Darlina Sicilian RDCS Referring Phys: [id number] [place]  1. Left ventricular ejection fraction, by estimation, is 50 to 55%. The left ventricle has low normal function. The left ventricle has no regional wall motion abnormalities. There is severe asymmetric left ventricular hypertrophy of the basal and septal  segments. Left ventricular diastolic parameters are consistent with Grade II diastolic dysfunction (pseudonormalization). Elevated left ventricular end-diastolic pressure.  2. Right ventricular systolic function is normal. The right ventricular size is normal.  3. The mitral valve is abnormal. No evidence of mitral valve regurgitation. No evidence of mitral stenosis.  4. The aortic valve is tricuspid. There is mild calcification of the aortic valve. Aortic valve regurgitation is not visualized. Mild aortic valve  sclerosis is present, with no evidence of aortic valve stenosis.  5. Aortic dilatation noted. There is mild dilatation of the aortic root, measuring 41 mm.  6. The inferior vena cava is normal in size with greater than 50% respiratory variability, suggesting right atrial pressure of 3 mmHg. FINDINGS  Left Ventricle: Left ventricular ejection fraction, by estimation, is 50 to 55%. The left ventricle has low normal function. The left ventricle has no regional wall  motion abnormalities. Definity contrast agent was given IV to delineate the left ventricular endocardial borders. The left ventricular internal cavity size was normal in size. There is severe asymmetric left ventricular hypertrophy of the basal and septal segments. Left ventricular diastolic parameters are consistent with Grade II diastolic dysfunction (pseudonormalization). Elevated left ventricular end-diastolic pressure. Right Ventricle: The right ventricular size is normal. No increase in right ventricular wall thickness. Right ventricular systolic function is normal. Left Atrium: Left atrial size was normal in size. Right Atrium: Right atrial size was normal in size. Pericardium: There is no evidence of pericardial effusion. Mitral Valve: The mitral valve is abnormal. There is mild thickening of the mitral valve leaflet(s). There is mild calcification of the mitral valve leaflet(s). Mild mitral annular calcification. No evidence of mitral valve regurgitation. No evidence of mitral valve stenosis. Tricuspid Valve: The tricuspid valve is normal in structure. Tricuspid valve regurgitation is not demonstrated. No evidence of tricuspid stenosis. Aortic Valve: The aortic valve is tricuspid. There is mild calcification of the aortic valve. Aortic valve regurgitation is not visualized. Mild aortic valve sclerosis is present, with no evidence of aortic valve stenosis. Pulmonic Valve: The pulmonic valve was normal in structure. Pulmonic valve regurgitation is not  visualized. No evidence of pulmonic stenosis. Aorta: The aortic root is normal in size and structure and aortic dilatation noted. There is mild dilatation of the aortic root, measuring 41 mm. Venous: The inferior vena cava is normal in size with greater than 50% respiratory variability, suggesting right atrial pressure of 3 mmHg. IAS/Shunts: No atrial level shunt detected by color flow Doppler.  LEFT VENTRICLE PLAX 2D LVIDd:         3.20 cm     Diastology LVIDs:         2.60 cm     LV e' medial:    3.15 cm/s LV PW:         1.40 cm     LV E/e' medial:  17.1 LV IVS:        2.00 cm     LV e' lateral:   3.37 cm/s LVOT diam:     2.00 cm     LV E/e' lateral: 16.0 LV SV:         41 LV SV Index:   19 LVOT Area:     3.14 cm  LV Volumes (MOD) LV vol d, MOD A2C: 91.2 ml LV vol d, MOD A4C: 78.4 ml LV vol s, MOD A2C: 43.8 ml LV vol s, MOD A4C: 36.8 ml LV SV MOD A2C:     47.4 ml LV SV MOD A4C:     78.4 ml LV SV MOD BP:      44.4 ml RIGHT VENTRICLE RV S prime:     8.16 cm/s LEFT ATRIUM             Index       RIGHT ATRIUM          Index LA diam:        3.00 cm 1.40 cm/m  RA Area:     6.38 cm LA Vol (A2C):   20.1 ml 9.37 ml/m  RA Volume:   7.69 ml  3.58 ml/m LA Vol (A4C):   21.3 ml 9.93 ml/m LA Biplane Vol: 21.6 ml 10.07 ml/m  AORTIC VALVE LVOT Vmax:   80.50 cm/s LVOT Vmean:  60.800 cm/s LVOT VTI:    0.130 m  AORTA Ao Root diam: 4.10 cm Ao Asc diam:  3.40  cm MITRAL VALVE MV Area (PHT): 3.01 cm    SHUNTS MV Decel Time: 252 msec    Systemic VTI:  0.13 m MV E velocity: 53.80 cm/s  Systemic Diam: 2.00 cm MV A velocity: 81.50 cm/s MV E/A ratio:  0.66 Jenkins Rouge MD Electronically signed by Jenkins Rouge MD Signature Date/Time: 05/26/2020/2:30:51 PM    Final    VAS [place] LOWER EXTREMITY VENOUS (DVT)  Result Date: 05/26/2020  Lower Venous DVT Study Indications: Edema, and Swelling.  Comparison Study: 05/11/20 previous Performing Technologist: Abram Sander RVS  Examination Guidelines: A complete evaluation includes B-mode imaging,  spectral Doppler, color Doppler, and power Doppler as needed of all accessible portions of each vessel. Bilateral testing is considered an integral part of a complete examination. Limited examinations for reoccurring indications may be performed as noted. The reflux portion of the exam is performed with the patient in reverse Trendelenburg.  +---------+---------------+---------+-----------+----------+--------------+ RIGHT    CompressibilityPhasicitySpontaneityPropertiesThrombus Aging +---------+---------------+---------+-----------+----------+--------------+ CFV      Full           Yes      Yes                                 +---------+---------------+---------+-----------+----------+--------------+ SFJ      Full                                                        +---------+---------------+---------+-----------+----------+--------------+ FV Prox  Full                                                        +---------+---------------+---------+-----------+----------+--------------+ FV Mid   Full                                                        +---------+---------------+---------+-----------+----------+--------------+ FV DistalFull                                                        +---------+---------------+---------+-----------+----------+--------------+ PFV      Full                                                        +---------+---------------+---------+-----------+----------+--------------+ POP      Full           Yes      Yes                                 +---------+---------------+---------+-----------+----------+--------------+ PTV      Full                                                        +---------+---------------+---------+-----------+----------+--------------+  PERO     Full                                                        +---------+---------------+---------+-----------+----------+--------------+    +---------+---------------+---------+-----------+----------+-----------------+ LEFT     CompressibilityPhasicitySpontaneityPropertiesThrombus Aging    +---------+---------------+---------+-----------+----------+-----------------+ CFV      Full           Yes      Yes                                    +---------+---------------+---------+-----------+----------+-----------------+ SFJ      Full                                                           +---------+---------------+---------+-----------+----------+-----------------+ FV Prox  Full                                                           +---------+---------------+---------+-----------+----------+-----------------+ FV Mid   Full                                                           +---------+---------------+---------+-----------+----------+-----------------+ FV DistalFull                                                           +---------+---------------+---------+-----------+----------+-----------------+ PFV      Full                                                           +---------+---------------+---------+-----------+----------+-----------------+ POP      None           No       No                   Age Indeterminate +---------+---------------+---------+-----------+----------+-----------------+ PTV      None                                         Age Indeterminate +---------+---------------+---------+-----------+----------+-----------------+ PERO     None                                         Age  Indeterminate +---------+---------------+---------+-----------+----------+-----------------+     Summary: RIGHT: - There is no evidence of deep vein thrombosis in the lower extremity.  - No cystic structure found in the popliteal fossa.  LEFT: - Findings consistent with age indeterminate deep vein thrombosis involving the left popliteal vein, left posterior tibial veins, and left  peroneal veins. - No cystic structure found in the popliteal fossa.  *See table(s) above for measurements and observations. Electronically signed by Harold Barban MD on 05/26/2020 at 9:28:41 PM.    Final    VAS [place] LOWER EXTREMITY VENOUS (DVT)  Result Date: 05/11/2020  Lower Venous DVT Study Indications: Edema, Recent Covid infection, and SOB.  Comparison Study: 03/31/20 negative bilateral lower extremity venous duplex Performing Technologist: Maudry Mayhew MHA, RDMS, RVT, RDCS  Examination Guidelines: A complete evaluation includes B-mode imaging, spectral Doppler, color Doppler, and power Doppler as needed of all accessible portions of each vessel. Bilateral testing is considered an integral part of a complete examination. Limited examinations for reoccurring indications may be performed as noted. The reflux portion of the exam is performed with the patient in reverse Trendelenburg.  +---------+---------------+---------+-----------+----------+--------------+ RIGHT    CompressibilityPhasicitySpontaneityPropertiesThrombus Aging +---------+---------------+---------+-----------+----------+--------------+ CFV      Full           Yes      Yes                                 +---------+---------------+---------+-----------+----------+--------------+ SFJ      Full                                                        +---------+---------------+---------+-----------+----------+--------------+ FV Prox  Full                                                        +---------+---------------+---------+-----------+----------+--------------+ FV Mid   Full                                                        +---------+---------------+---------+-----------+----------+--------------+ FV DistalFull                                                        +---------+---------------+---------+-----------+----------+--------------+ PFV      Full                                                         +---------+---------------+---------+-----------+----------+--------------+ POP      Full           Yes      Yes                                 +---------+---------------+---------+-----------+----------+--------------+  PTV      Full                                                        +---------+---------------+---------+-----------+----------+--------------+ PERO     Full                                                        +---------+---------------+---------+-----------+----------+--------------+ Gastroc  None                    No                   Acute          +---------+---------------+---------+-----------+----------+--------------+   +---------+---------------+---------+-----------+----------+--------------+ LEFT     CompressibilityPhasicitySpontaneityPropertiesThrombus Aging +---------+---------------+---------+-----------+----------+--------------+ CFV      Full           Yes      Yes                                 +---------+---------------+---------+-----------+----------+--------------+ SFJ      Full                                                        +---------+---------------+---------+-----------+----------+--------------+ FV Prox  None                    No                   Acute          +---------+---------------+---------+-----------+----------+--------------+ FV Mid   None                    No                   Acute          +---------+---------------+---------+-----------+----------+--------------+ FV DistalNone                    No                   Acute          +---------+---------------+---------+-----------+----------+--------------+ PFV      Full                                                        +---------+---------------+---------+-----------+----------+--------------+ POP      None                    No                   Acute           +---------+---------------+---------+-----------+----------+--------------+ PTV      None  No                   Acute          +---------+---------------+---------+-----------+----------+--------------+ PERO     None                    No                   Acute          +---------+---------------+---------+-----------+----------+--------------+     Summary: RIGHT: - Findings consistent with acute deep vein thrombosis involving the right gastrocnemius veins. - No cystic structure found in the popliteal fossa.  LEFT: - Findings consistent with acute deep vein thrombosis involving the left femoral vein, left popliteal vein, left posterior tibial veins, and left peroneal veins. - No cystic structure found in the popliteal fossa.  *See table(s) above for measurements and observations. Electronically signed by Harold Barban MD on 05/11/2020 at 9:56:21 PM.    Final

## 2020-05-31 NOTE — Telephone Encounter (Signed)
Scheduled hospital follow up °

## 2020-05-31 NOTE — Telephone Encounter (Signed)
HFU scheduled for 05/23 at 2:30.  Will route back to ND to make her aware.

## 2020-05-31 NOTE — Progress Notes (Signed)
PT Cancellation Note  Patient Details Name: Tyrone Roman MRN: [id number] DOB: February 12, 1945   Cancelled Treatment:    Reason Eval/Treat Not Completed: (P) Fatigue/lethargy limiting ability to participate (pt refusing, reports he just had bed sheets changed and got set up to eat lunch. States "I'm not getting up again today".) Will continue efforts next date per PT POC as schedule permits.   Kara Pacer Donna Snooks 05/31/2020, 1:37 PM

## 2020-05-31 NOTE — Progress Notes (Signed)
OT Cancellation Note  Patient Details Name: Tyrone Roman MRN: [id number] DOB: Mar 18, 1945   Cancelled Treatment:    Reason Eval/Treat Not Completed: Other (comment). Pt just got lunch and requests therapy return later. OT will follow up next available time  Britt Bottom 05/31/2020, 1:37 PM

## 2020-06-01 LAB — COMPREHENSIVE METABOLIC PANEL
ALT: 48 U/L — ABNORMAL HIGH (ref 0–44)
AST: 55 U/L — ABNORMAL HIGH (ref 15–41)
Albumin: 2.4 g/dL — ABNORMAL LOW (ref 3.5–5.0)
Alkaline Phosphatase: 79 U/L (ref 38–126)
Anion gap: 8 (ref 5–15)
BUN: 38 mg/dL — ABNORMAL HIGH (ref 8–23)
CO2: 24 mmol/L (ref 22–32)
Calcium: 8.8 mg/dL — ABNORMAL LOW (ref 8.9–10.3)
Chloride: 99 mmol/L (ref 98–111)
Creatinine, Ser: 0.92 mg/dL (ref 0.61–1.24)
GFR, Estimated: >60 mL/min (ref >60)
Glucose, Bld: 225 mg/dL — ABNORMAL HIGH (ref 70–99)
Potassium: 5.2 mmol/L — ABNORMAL HIGH (ref 3.5–5.1)
Sodium: 131 mmol/L — ABNORMAL LOW (ref 135–145)
Total Bilirubin: 1.8 mg/dL — ABNORMAL HIGH (ref 0.3–1.2)
Total Protein: 4.1 g/dL — ABNORMAL LOW (ref 6.5–8.1)

## 2020-06-01 LAB — CBC
HCT: 38.2 % — ABNORMAL LOW (ref 39.0–52.0)
Hemoglobin: 12.4 g/dL — ABNORMAL LOW (ref 13.0–17.0)
MCH: 28.8 pg (ref 26.0–34.0)
MCHC: 32.5 g/dL (ref 30.0–36.0)
MCV: 88.6 fL (ref 80.0–100.0)
Platelets: 168 10*3/uL (ref 150–400)
RBC: 4.31 MIL/uL (ref 4.22–5.81)
RDW: 20.3 % — ABNORMAL HIGH (ref 11.5–15.5)
WBC: 12.1 10*3/uL — ABNORMAL HIGH (ref 4.0–10.5)
nRBC: 0.2 % (ref 0.0–0.2)

## 2020-06-01 LAB — BRAIN NATRIURETIC PEPTIDE: B Natriuretic Peptide: 294.8 pg/mL — ABNORMAL HIGH (ref 0.0–100.0)

## 2020-06-01 LAB — GLUCOSE, CAPILLARY
Glucose-Capillary: 193 mg/dL — ABNORMAL HIGH (ref 70–99)
Glucose-Capillary: 226 mg/dL — ABNORMAL HIGH (ref 70–99)
Glucose-Capillary: 137 mg/dL — ABNORMAL HIGH (ref 70–99)
Glucose-Capillary: 247 mg/dL — ABNORMAL HIGH (ref 70–99)

## 2020-06-01 LAB — C-REACTIVE PROTEIN: CRP: 1.7 mg/dL — ABNORMAL HIGH (ref <1.0)

## 2020-06-01 LAB — PROCALCITONIN: Procalcitonin: <0.1 ng/mL

## 2020-06-01 LAB — MAGNESIUM: Magnesium: 2 mg/dL (ref 1.7–2.4)

## 2020-06-01 MED ORDER — PREDNISONE 20 MG PO TABS: 30.0000 mg | ORAL_TABLET | Freq: Two times a day (BID) | ORAL | Status: DC

## 2020-06-01 MED ORDER — PREDNISONE 20 MG PO TABS
30.0000 mg | ORAL_TABLET | Freq: Two times a day (BID) | ORAL | Status: DC
  Administered 2020-06-02: 30 mg via ORAL
  Filled 2020-06-01: qty 1

## 2020-06-01 MED ORDER — PREDNISONE 20 MG PO TABS: 30.0000 mg | ORAL_TABLET | Freq: Every day | ORAL | Status: DC

## 2020-06-01 NOTE — TOC Progression Note (Addendum)
Transition of Care (TOC) - Progression Note  Valentina Gu, BSN Transitions of Care Unit 4E- RN Case Manager See Treatment Team for direct phone #    Patient Details  Name: Tyrone Roman MRN: [id number] Date of Birth: Jun 13, 1945  Transition of Care [facility]) CM/SW Contact  Dahlia Client Romeo Rabon, RN Phone Number: 06/01/2020, 4:39 PM  Clinical Narrative:    Follow up done today for transition of care needs. Daughter returned call and confirmed pt and her will continue services for [facility] with [facility]. Cory with [facility] has been notified for resumption of [place] services.  Private duty info has been provided to pt per his and his daughter's request, they will research further regarding these services for home.  DME has been ordered and arranged with [employer]- plan to deliver to home once pt home- trapeze bar for bed, RW, 3n1, and sliding board.   Per pt he is requesting assistance with transport home- he is going to see if w/c can be brought to hospital - so transport either by w/c vs stretcher home - please f/u with pt in AM- will need -02 for transport.    Expected Discharge Plan: [place] Barriers to Discharge: Continued Medical Work up  Expected Discharge Plan and Services Expected Discharge Plan: [place]   Discharge Planning Services: CM Consult Post Acute Care Choice: Durable Medical Equipment,Home Health,Resumption of Svcs/PTA Provider Living arrangements for the past 2 months: Single Family Home                 DME Arranged: Other see comment DME Agency: [employer] Date DME Agency Contacted: 05/31/20 Time DME Agency Contacted: 64 Representative spoke with at DME Agency: [facility] Arranged: RN,PT,OT,Nurse's [place] Work [employer] Agency: [place] Date [place]: 06/01/20 Time [place]: 1600 Representative spoke with at [place]: [place] ([place]) Interventions    Readmission Risk  Interventions No flowsheet data found.

## 2020-06-01 NOTE — Progress Notes (Signed)
Physical Therapy Treatment Patient Details Name: Tyrone Roman MRN: [id number] DOB: Feb 05, 1946 Today's Date: 06/01/2020    History of Present Illness 75 y.o. male with PMH significant for CAD with prior MI s/p PCI with DES, Stroke, COPD, acute on chronic hypoxic respiratory failure on 2L [place] at baseline, recent COVID PNA, HTN, HLD, RA (previosuly on Rituxan), BPH prior lumbar surgery with herniated nucleus pulposis, Barrett's esophageal ulceration, and dysphagia who presented to the ED with complaints of acute worsening hypoxia. CTA chest reveals progressive PE, now extending from the central right pulmonary artery into the lobar segments.    PT Comments    Pt received in supine, agreeable to therapy session and with good tolerance for supine/seated exercises and bed mobility. Pt with improved endurance this date and needed 2L-3L HF [place] with supine/seated exertion to maintain SpO2 >90%, HR in 70's bpm throughout. Pt given HEP handout (link: HEP: [email or web address] Access Code: [email or web address]) and encouraged to perform BID to TID. Pt continues to benefit from PT services to progress toward functional mobility goals. Continue to recommend HHPT, of note DME recommendations updated below per discussion with patient and supervising PT Bunnie Philips.  Follow Up Recommendations  Home health PT;Supervision/Assistance - 24 hour;Supervision for mobility/OOB (pt refusing SNF)     Equipment Recommendations  Hospital bed;Other (comment);3in1 (PT);Rolling walker with 5" wheels (3in1 with elongated seat, trapeze bar for hospital bed, slide board)    Recommendations for Other Services       Precautions / Restrictions Precautions Precautions: Fall;Other (comment) Precaution Comments: watch sats Restrictions Weight Bearing Restrictions: No    Mobility  Bed Mobility Overal bed mobility: Needs Assistance Bed Mobility: Rolling;Sidelying to Sit;Sit to Sidelying Rolling: Supervision Sidelying to sit: Min  guard     Sit to sidelying: Min guard General bed mobility comments: increased time, +rail; pt able to perform posterior supine scoot toward [facility] with overhead rails and supervision    Transfers                 General transfer comment: pt deferring 2/2 fatigue after seated exercises  Ambulation/Gait             General Gait Details: pt defers      Balance Overall balance assessment: Needs assistance Sitting-balance support: Feet supported;Single extremity supported Sitting balance-Leahy Scale: Fair Sitting balance - Comments: needs postural cues for improved oxygenation but able to sit upright, prefers U UE support at least       Cognition Arousal/Alertness: Awake/alert Behavior During Therapy: WFL for tasks assessed/performed Overall Cognitive Status: Impaired/Different from baseline Area of Impairment: Problem solving;Safety/judgement;Memory      Memory: Decreased short-term memory   Safety/Judgement: Decreased awareness of safety;Decreased awareness of deficits   Problem Solving: Requires verbal cues;Requires tactile cues;Slow processing General Comments: flat affect, pt fear of falls limits his participation/effort with standing tasks but will participate with increased time/encouragement in functional mobility tasks; improved affect this session, pt reports optimistic he will be able to discharge tomorrow.      Exercises General Exercises - Lower Extremity Ankle Circles/Pumps: AROM;Both;10 reps;Supine Quad Sets: Both;10 reps;Supine;AAROM (pt needs cues to count out loud for breathing and tactile input to understand exercise) Long Arc Quad: AROM;Both;10 reps;Seated;Other (comment) Heel Slides: AROM;Both;Supine;10 reps Straight Leg Raises: AAROM;Both;Supine;10 reps Hip Flexion/Marching: AAROM;Both;10 reps;Supine Other Exercises Other Exercises: BUE AROM: shoulder alphabet (26 reps on L and 5 on R) x10 Other Exercises: B pec stretch with hands behind  head, pressing elbows into bed x90 sec  General Comments General comments (skin integrity, edema, etc.): received on 2L HF [place] and SpO2 93-97% on 2L with supine exercise, desat to 88% on 2L with seated transfer so wall O2 increased to 3L HF [place] and SpO2 89-91% on 3L with seated activity; pt defers standing. BP WNL prior to exertion and pt defers seated BP assessment due to fatigue after seated exercises wanting to return to supine      Pertinent Vitals/Pain Pain Assessment: Faces Faces Pain Scale: Hurts a little bit Pain Location: low back pain Pain Descriptors / Indicators: Discomfort;Sore Pain Intervention(s): Limited activity within patient's tolerance;Monitored during session;Repositioned           PT Goals (current goals can now be found in the care plan section) Acute Rehab PT Goals Patient Stated Goal: Go to my daughter's graduation from [place] in June PT Goal Formulation: With patient Time For Goal Achievement: 06/10/20 Potential to Achieve Goals: Fair Progress towards PT goals: Progressing toward goals    Frequency    Min 3X/week      PT Plan Equipment recommendations need to be updated       AM-PAC PT "6 Clicks" Mobility   Outcome Measure  Help needed turning from your back to your side while in a flat bed without using bedrails?: A Little Help needed moving from lying on your back to sitting on the side of a flat bed without using bedrails?: A Little Help needed moving to and from a bed to a chair (including a wheelchair)?: A Lot Help needed standing up from a chair using your arms (e.g., wheelchair or bedside chair)?: A Lot Help needed to walk in hospital room?: Total Help needed climbing 3-5 steps with a railing? : Total 6 Click Score: 12    End of Session Equipment Utilized During Treatment: Oxygen Activity Tolerance: Patient limited by fatigue;Patient tolerated treatment well Patient left: in bed;with call bell/phone within reach;with bed alarm  set Nurse Communication: Mobility status;Other (comment) (needed 3L HF [place] with exertion) PT Visit Diagnosis: Unsteadiness on feet (R26.81);Other abnormalities of gait and mobility (R26.89);Muscle weakness (generalized) (M62.81);Difficulty in walking, not elsewhere classified (R26.2)     Time: 1000-1040 PT Time Calculation (min) (ACUTE ONLY): 40 min  Charges:  $Therapeutic Exercise: 23-37 mins $Therapeutic Activity: 8-22 mins                     Ma Munoz P., PTA Acute Rehabilitation Services Pager: [phone] Office: [place] 06/01/2020, 11:09 AM

## 2020-06-01 NOTE — Progress Notes (Signed)
PROGRESS NOTE                                                                                                                                                                                                             Patient Demographics:    Tyrone Roman, is a 75 y.o. male, DOB - 1945-08-04, [id number]  Outpatient Primary MD for the patient is [name], MD   Admit date - 05/25/2020   LOS - 7  Chief Complaint  Patient presents with  . Code Sepsis       Brief Narrative: Patient is a 75 y.o. male with PMHx of RA, CAD s/p PCI, HTN, HLD, COPD, BPH-who had COVID-19 infection in January 2021-since then has had numerous hospitalizations for shortness of breath-FUO-most recently discharged from 4/4-4/13-after extensive work-up including ID evaluation-found to have PE/DVT-maintained on prednisone 30 mg p.o. twice daily (fever responsive to steroids per prior notes)-presenting back to the hospital with 4-5-day history of recurrent fever (as high as 103 F) and worsening shortness of breath.  See below for further details.  Summary of prior Hospitalizations: 1/24 - 1/26>> admit for COVID PNA- received IV steroids/remdesivir-No baricitinib/Actemra due to underling immunosuppressants. Seen with Coag-neg staph bacteremia, ID seen with no reccs for ABT's 2/10 - 2/11>> Readmitted recurrent SOB/hypoxia with substernal CP. Concern that steroid taper was completed to quickly, patient was placed on 10 day taper. 2/18 - 2/25>> Readmitted for recurrent SOB/hypoxia. COVID PCR remained positive. Negative for PE. Received zosyn/vanc with switch to PO Augmentin at d/c. Treated with short steroid taper  2/27 - 3/2>> Readmitted and treated for SOB with fever and AMS. ? Aspiration PNA and received empiric Unasyn and switched to Augmentin on d/c 4/4 - 4/13>> Readmitted from [facility] forhypoxic respiratory failure to be due to acute on chronic  congestive heart failure. COVID PCR positive.He received broad spectrum ABT which were eventually d/c due to no obvious cause.  Had FUO of unknown etiology-with concern for possible vasculities that temporary resolves with higher doses of IV steroids. D/C'd on 30mg  BID prednisone and Eliquis for new acute PE seen on CTA chest  COVID-19 vaccinated status: Vaccinated including booster ( COVID antibodies undetectable x 2 in spite of vaccine/natural infection)  Significant Events: 4/20>> Admit to [facility] for recurrent fever/shortness of breath-significant infiltrates on CT chest-worsening clot burden.  Requiring anywhere from 10-12 L of oxygen. 4/21>> MAB infusion.  Significant studie 4/21>>> Serum SARS-COV-2 antibody-IgG: Negative 4/21>>Serum Fungitell:pending 4/20>> CTA chest: PE extending from right pulmonary artery into lobar/segmental branches, significant bilateral pulmonary infiltrates. 4/21>> B/L lower ext Doppler: Left popliteal- posterior tibial-peroneal vein DVT(Previously B/L DVT on 4/6) 4/21>> Echo: EF [id number], RV systolic function normal. 4/13>> MPO/ANCA: Negative 4/12>> SPEP: No M spike 4/10>> RA factor: 12.9 (WNL) 4/10>> ACE: 34 (WNL) 4/9>> GOLD QuantiFERON: Negative 4/9>> CMV IgG/IgM: Negative 4/9>> EBV IgM: Negative 4/9>> ANA: Negative 4/9>> Serum cryptococcal/histoplasma/blastomycosis antigen: Negative 4/9>> Serum SARS-COV-2 antibody-IgG: Negative 4/6>> bilateral lower extremity Doppler: Bilateral lower extremity DVT 2/27>> Echo: EF 60-65%  COVID-19 medications: Remdesivir: 4/21>> IV Solu-Medrol: 4/21>> Bebtelovimab: 4/21 x 1  Antibiotics: Vancomycin: 4/20 x 1 Flagyl: 4/20>> 4/21 Cefepime: 4/20 x 1  Microbiology data: 4/21>> sputum AFB smear/culture, PCP smear and culture sensitivity: Ordered 4/20 >>blood culture: No growth 4/20>> SARS 2 coronavirus PCR: Positive with cycle threshold of 24.2 4/4>> SARS 2 coronavirus PCR: Positive with cycle threshold of  29.6 2/18>> SARS 2 coronavirus PCR: Positive with cycle threshold of 37.5  Procedures: None  Consults: ID, PCCM  DVT prophylaxis: IV heparin    Subjective:   Patient in bed, appears comfortable, denies any headache, no fever, no chest pain or pressure, no shortness of breath , no abdominal pain. No new focal weakness.    Assessment  & Plan :   Acute hypoxic respiratory failure: Multifactorial etiology-worsening clot burden/PE-likely ongoing/acute COVID-19 infection-superimposed significant bilateral pulmonary infiltrates (autoimmune/COVID inflammation versus opportunistic infection).  Remains on IV heparin-previously on oral prednisone as outpatient-has been escalated to IV Solu-Medrol-and has been started on Remdesivir.  Continue as needed Lasix with repeat dose of IV Lasix on 05/30/2020,.  ID/PCCM following-some mild improvement in hypoxemia compared to yesterday-Down to 7 L of HFNC on 05/30/20, and now improving.  He has been encouraged to sit up in chair at least 5 to 6 hours a day and use flutter valve for pulmonary toiletry, he has been doing that since 05/29/2020 with good improvement.   Acute vs persistent COVID-19 infection: Continues to test positive for COVID-19 since January-however last 2 tests in April-with significant cycle threshold-that continues to indicate the patient is infectious with Cycle counts < 30.  This is likely due to his immunosuppression from rheumatoid medications that are preventing him to clear his infection.  ID and pulmonary were consulted and he was treated with a course of remdesivir, tapering IV steroids along with 1 dose of monoclonal antibody (after significant discussion with ID/PCCM - Bebtelovimab ).  Note upon discharge will go on oral steroid likely 30 mg BID - home dose.  Bilateral pulmonary infiltrates:  Etiology of these infiltrates not clear seen by pulmonary and ID, could be COVID.  Sputum for PCP/AFB/bacterial cultures get sent out along with  Fungitell assay.  Fungitell assay is negative however is growing Candida glabrata in his sputum, informed ID and pulmonary, no treatment needed per them.  Pulmonary embolism/DVT: Has significant clot burden on CT done on 4/20-in spite of being on Eliquis-lower extremity Doppler on 4/21 only shows left lower extremity DVT-a prior Doppler on 4/6 showed bilateral DVT.  Kept on heparin drip and Lovenox, upon discharge does not want to take shots, will go on Xarelto most like.  FUO: Unclear etiology-ID and pulmonary were consulted, recurrent COVID infection, adrenal insufficiency and some clot burden.  Placed on steroids, COVID-19 treatment, anticoagulant changed as above, fever much improved and now resolved.  All blood and urine cultures negative, sputum growing Candida Glabrata.  ID and pulmonary following, have deferred any antifungal treatment to ID and pulm.  Chronic diastolic heart failure: Euvolemic on exam-await echo-we will keep in negative balance and dose with diuretics periodically.  Hyponatremia: Euvolemic on exam-could have SIADH pathophysiology-improved-continue to watch closely.  Dose with as needed doses of IV Lasix.  HTN: BP is stable-resume antihypertensives over the next few days.  HLD: Continue statin  History of rheumatoid arthritis: Has been on prednisone for the past several months-see above.  Also suspicion of relative adrenal insufficiency.  COPD: Continue inhaler regimen  GERD: Continue PPI  BPH: Continue Flomax  Debility/deconditioning: Due to acute on chronic illness-await PT/OT eval.  Previously had refused SNF.  He is refusing SNF again.  Daughter has been informed.    Condition - Extremely Guarded  Family Communication  :    Tyrone Roman-left a voicemail on 4/21, 4/22, 05/29/20, 05/30/20, 05/31/20  Code Status :  Full Code  Diet :   Diet Order            Diet Carb Modified Fluid consistency: Thin; Room service appropriate? Yes  Diet  effective now                  Disposition Plan  :  [facility] transfer center 05/28/20 - Dr L.Wahid, nothing else to offer agrees with management.  Status is: Inpatient  Remains inpatient appropriate because:Inpatient level of care appropriate due to severity of illness   Dispo: The patient is from: Home              Anticipated d/c is to: TBD              Patient currently is not medically stable to d/c.   Difficult to place patient No    Barriers to discharge: Hypoxia requiring O2 supplementation/complete 5 days of IV Remdesivir  Antimicorbials  :    Anti-infectives (From admission, onward)   Start     Dose/Rate Route Frequency Ordered Stop   05/31/20 1145  sulfamethoxazole-trimethoprim (BACTRIM) 400-80 MG per tablet 1 tablet        1 tablet Oral Daily 05/31/20 1052     05/27/20 1000  remdesivir 100 mg in sodium chloride 0.9 % 100 mL IVPB  Status:  Discontinued       "Followed by" Linked Group Details   100 mg 200 mL/hr over 30 Minutes Intravenous Daily 05/26/20 1134 05/26/20 1143   05/27/20 1000  remdesivir 100 mg in sodium chloride 0.9 % 100 mL IVPB       "Followed by" Linked Group Details   100 mg 200 mL/hr over 30 Minutes Intravenous Daily 05/26/20 1143 05/30/20 0956   05/26/20 1300  remdesivir 100 mg in sodium chloride 0.9 % 100 mL IVPB  Status:  Discontinued       "Followed by" Linked Group Details   100 mg 200 mL/hr over 30 Minutes Intravenous  Once 05/26/20 1134 05/26/20 1143   05/26/20 1200  remdesivir 200 mg in sodium chloride 0.9% 250 mL IVPB       "Followed by" Linked Group Details   200 mg 580 mL/hr over 30 Minutes Intravenous Once 05/26/20 1143 05/26/20 1503   05/26/20 0500  vancomycin (VANCOREADY) IVPB 1250 mg/250 mL  Status:  Discontinued        1,250 mg 166.7 mL/hr over 90 Minutes Intravenous Every 12 hours 05/25/20 1748 05/26/20 1108   05/26/20 0000  ceFEPIme (MAXIPIME) 2 g  in sodium chloride 0.9 % 100 mL IVPB  Status:  Discontinued        2 g 200  mL/hr over 30 Minutes Intravenous Every 8 hours 05/25/20 1745 05/26/20 1108   05/25/20 2315  metroNIDAZOLE (FLAGYL) IVPB 500 mg  Status:  Discontinued        500 mg 100 mL/hr over 60 Minutes Intravenous Every 8 hours 05/25/20 2223 05/26/20 1108   05/25/20 1600  vancomycin (VANCOREADY) IVPB 1000 mg/200 mL  Status:  Discontinued        1,000 mg 200 mL/hr over 60 Minutes Intravenous  Once 05/25/20 1548 05/25/20 1555   05/25/20 1600  aztreonam (AZACTAM) 2 g in sodium chloride 0.9 % 100 mL IVPB  Status:  Discontinued        2 g 200 mL/hr over 30 Minutes Intravenous  Once 05/25/20 1548 05/25/20 1552   05/25/20 1600  ceFEPIme (MAXIPIME) 2 g in sodium chloride 0.9 % 100 mL IVPB  Status:  Discontinued        2 g 200 mL/hr over 30 Minutes Intravenous Every 8 hours 05/25/20 1555 05/25/20 1557   05/25/20 1600  vancomycin (VANCOREADY) IVPB 2000 mg/400 mL        2,000 mg 200 mL/hr over 120 Minutes Intravenous  Once 05/25/20 1557 05/25/20 1918   05/25/20 1600  ceFEPIme (MAXIPIME) 2 g in sodium chloride 0.9 % 100 mL IVPB        2 g 200 mL/hr over 30 Minutes Intravenous  Once 05/25/20 1557 05/25/20 1647      Inpatient Medications  Scheduled Meds: . enoxaparin (LOVENOX) injection  90 mg Subcutaneous Q12H  . insulin aspart  0-20 Units Subcutaneous TID WC  . insulin aspart  0-5 Units Subcutaneous QHS  . insulin aspart  3 Units Subcutaneous TID WC  . insulin glargine  35 Units Subcutaneous Daily  . methylPREDNISolone (SOLU-MEDROL) injection  40 mg Intravenous Daily  . metoprolol tartrate  50 mg Oral BID  . mometasone-formoterol  2 puff Inhalation BID  . pantoprazole  40 mg Oral Daily  . simvastatin  20 mg Oral Daily  . sulfamethoxazole-trimethoprim  1 tablet Oral Daily  . tamsulosin  0.4 mg Oral Daily  . umeclidinium bromide  1 puff Inhalation Daily   Continuous Infusions: . sodium chloride    . famotidine (PEPCID) IV     PRN Meds:.sodium chloride, acetaminophen **OR** [DISCONTINUED]  acetaminophen, albuterol, diphenhydrAMINE, EPINEPHrine, famotidine (PEPCID) IV, melatonin   Time Spent in minutes  45  See all Orders from today for further details   Lala Lund M.D on 06/01/2020 at 10:34 AM  To page go to [email or web address] - use universal password  [facility] -  Office  [phone]    Objective:   Vitals:   06/01/20 0249 06/01/20 0318 06/01/20 0650 06/01/20 0752  BP:  103/73  109/85  Pulse:  77 72 72  Resp:  12 17 16   Temp:  (!) 97.4 F (36.3 C)  (!) 97.5 F (36.4 C)  TempSrc:  Oral  Oral  SpO2: (S) (!) 84% 97% 97% 99%  Weight:      Height:        Wt Readings from Last 3 Encounters:  05/25/20 88 kg  05/09/20 88 kg  05/05/20 88.5 kg     Intake/Output Summary (Last 24 hours) at 06/01/2020 1034 Last data filed at 06/01/2020 0318 Gross per 24 hour  Intake 250 ml  Output 950 ml  Net -700 ml     Physical  Exam  Awake Alert, No new F.N deficits, Normal affect [place].AT,PERRAL Supple Neck,No JVD, No cervical lymphadenopathy appriciated.  Symmetrical Chest wall movement, Good air movement bilaterally, CTAB RRR,No Gallops, Rubs or new Murmurs, No Parasternal Heave +ve B.Sounds, Abd Soft, No tenderness, No organomegaly appriciated, No rebound - guarding or rigidity. No Cyanosis, Clubbing or edema, No new Rash or bruise    Data Review:   Recent Labs  Lab 05/25/20 1547 05/25/20 1626 05/28/20 0047 05/29/20 0608 05/30/20 0209 05/31/20 0227 06/01/20 0250  WBC 11.6*   < > 10.4 12.8* 13.7* 14.6* 12.1*  HGB 11.5*   < > 10.7* 10.8* 12.0* 13.1 12.4*  HCT 36.6*   < > 33.2* 33.7* 37.3* 40.4 38.2*  PLT 155   < > PLATELET CLUMPS NOTED ON SMEAR, UNABLE TO ESTIMATE 113* 139* 174 168  MCV 89.3   < > 89.0 88.5 87.4 87.1 88.6  MCH 28.0   < > 28.7 28.3 28.1 28.2 28.8  MCHC 31.4   < > 32.2 32.0 32.2 32.4 32.5  RDW 20.5*   < > 20.0* 19.6* 19.5* 19.9* 20.3*  LYMPHSABS 0.2*  --   --   --   --   --   --   MONOABS 0.2  --   --   --   --   --   --   EOSABS  0.0  --   --   --   --   --   --   BASOSABS 0.0  --   --   --   --   --   --    < > = values in this interval not displayed.    Recent Labs  Lab 05/25/20 1547 05/25/20 1547 05/25/20 1556 05/25/20 1626 05/25/20 2231 05/26/20 0141 05/27/20 0054 05/28/20 0047 05/28/20 0300 05/29/20 [id number] 05/30/20 0209 05/31/20 0227 06/01/20 0250  NA 129*  --   --    < >  --   --  133* 130*  --  133* 134* 132*  --   K 4.6  --   --    < >  --   --  4.8 4.7  --  4.4 3.7 4.3  --   CL 98  --   --    < >  --   --  99 97*  --  99 100 97*  --   CO2 21*  --   --   --   --   --  28 21*  --  27 23 25   --   GLUCOSE 225*  --   --    < >  --   --  110* 554*  --  265* 162* 88  --   BUN 21  --   --    < >  --   --  16 24*  --  30* 29* 39*  --   CREATININE 0.90  --   --    < >  --   --  0.80 1.09  --  0.76 0.90 1.04  --   CALCIUM 9.5  --   --   --   --   --  8.9 9.0  --  8.9 9.5 9.5  --   AST 29  --   --   --   --   --  34 29  --  24 28 46*  --   ALT 27  --   --   --   --   --  27 24  --  28 34 50*  --   ALKPHOS 76  --   --   --   --   --  58 82  --  71 79 82  --   BILITOT 0.8  --   --   --   --   --  0.8 0.3  --  0.5 0.5 1.1  --   ALBUMIN 2.3*  --   --   --   --   --  3.3* 2.2*  --  2.2* 2.4* 2.5*  --   MG  --   --   --   --  1.7  --   --   --  2.0 1.8 1.9 2.0  --   CRP  --    < >  --   --   --  5.5* 3.9* 4.4*  --  1.5* 1.1* 1.2*  --   DDIMER  --   --   --   --   --  1.22*  --   --   --   --   --   --   --   PROCALCITON  --   --   --   --  <0.10  --   --   --   --   --   --  <0.10  --   LATICACIDVEN  --   --  3.1*  --  2.6*  --   --   --   --   --   --   --   --   INR 1.7*  --   --   --   --   --   --   --   --   --   --   --   --   BNP  --    < >  --   --   --   --  212.1*  --  219.5* 503.2* 1,922.0* 325.0* 294.8*   < > = values in this interval not displayed.      Coagulation profile Recent Labs  Lab 05/25/20 1547  INR 1.7*    No results for input(s): DDIMER in the last 72 hours.  Cardiac  Enzymes No results for input(s): CKMB, TROPONINI, MYOGLOBIN in the last 168 hours.  Invalid input(s): CK ------------------------------------------------------------------------------------------------------------------    Component Value Date/Time   BNP 294.8 (H) 06/01/2020 0250    Micro Results Recent Results (from the past 240 hour(s))  Resp Panel by RT-PCR (Flu A&B, Covid) Nasopharyngeal Swab     Status: Abnormal   Collection Time: 05/25/20  3:56 PM   Specimen: Nasopharyngeal Swab; Nasopharyngeal(NP) swabs in vial transport medium  Result Value Ref Range Status   SARS Coronavirus 2 by RT PCR POSITIVE (A) NEGATIVE Final    Comment: RESULT CALLED TO, READ BACK BY AND VERIFIED WITH: Sherlean Foot RN 2032 05/25/20 A BROWNING (NOTE) SARS-CoV-2 target nucleic acids are DETECTED.  The SARS-CoV-2 RNA is generally detectable in upper respiratory specimens during the acute phase of infection. Positive results are indicative of the presence of the identified virus, but do not rule out bacterial infection or co-infection with other pathogens not detected by the test. Clinical correlation with patient history and other diagnostic information is necessary to determine patient infection status. The expected result is Negative.  Fact Sheet for Patients: EntrepreneurPulse.com.au  Fact Sheet for Healthcare Providers: IncredibleEmployment.be  This test is not yet approved or cleared by the [place] FDA  and  has been authorized for detection and/or diagnosis of SARS-CoV-2 by FDA under an Emergency Use Authorization (EUA).  This EUA will remain in effect (meaning this test can  be used) for the duration of  the COVID-19 declaration under Section 564(b)(1) of the Act, 21 U.S.C. section 360bbb-3(b)(1), unless the authorization is terminated or revoked sooner.     Influenza A by PCR NEGATIVE NEGATIVE Final   Influenza B by PCR NEGATIVE NEGATIVE Final     Comment: (NOTE) The Xpert Xpress SARS-CoV-2/FLU/RSV plus assay is intended as an aid in the diagnosis of influenza from Nasopharyngeal swab specimens and should not be used as a sole basis for treatment. Nasal washings and aspirates are unacceptable for Xpert Xpress SARS-CoV-2/FLU/RSV testing.  Fact Sheet for Patients: EntrepreneurPulse.com.au  Fact Sheet for Healthcare Providers: IncredibleEmployment.be  This test is not yet approved or cleared by the [place] FDA and has been authorized for detection and/or diagnosis of SARS-CoV-2 by FDA under an Emergency Use Authorization (EUA). This EUA will remain in effect (meaning this test can be used) for the duration of the COVID-19 declaration under Section 564(b)(1) of the Act, 21 U.S.C. section 360bbb-3(b)(1), unless the authorization is terminated or revoked.  Performed at [facility], [place] [address] [place], [place] [address]   Blood Culture (routine x 2)     Status: None   Collection Time: 05/25/20  3:59 PM   Specimen: BLOOD  Result Value Ref Range Status   Specimen Description BLOOD SITE NOT SPECIFIED  Final   Special Requests   Final    BOTTLES DRAWN AEROBIC AND ANAEROBIC Blood Culture results may not be optimal due to an excessive volume of blood received in culture bottles   Culture   Final    NO GROWTH 5 DAYS Performed at [facility], [place] [address], [place], [place] [address]    Report Status 05/30/2020 FINAL  Final  Blood Culture (routine x 2)     Status: None   Collection Time: 05/25/20  6:18 PM   Specimen: BLOOD RIGHT HAND  Result Value Ref Range Status   Specimen Description BLOOD RIGHT HAND  Final   Special Requests   Final    BOTTLES DRAWN AEROBIC AND ANAEROBIC Blood Culture adequate volume   Culture   Final    NO GROWTH 5 DAYS Performed at [facility], [place] [address], [place], [place] [address]    Report Status 05/30/2020 FINAL  Final  Urine  culture     Status: Abnormal   Collection Time: 05/25/20  7:16 PM   Specimen: In/Out Cath Urine  Result Value Ref Range Status   Specimen Description IN/OUT CATH URINE  Final   Special Requests   Final    NONE Performed at [facility], [place] [address], [place], [place] [address]    Culture 20,000 COLONIES/mL STAPHYLOCOCCUS EPIDERMIDIS (A)  Final   Report Status 05/29/2020 FINAL  Final   Organism ID, Bacteria STAPHYLOCOCCUS EPIDERMIDIS (A)  Final      Susceptibility   Staphylococcus epidermidis - MIC*    CIPROFLOXACIN >=8 RESISTANT Resistant     GENTAMICIN 4 SENSITIVE Sensitive     NITROFURANTOIN <=16 SENSITIVE Sensitive     OXACILLIN >=4 RESISTANT Resistant     TETRACYCLINE 2 SENSITIVE Sensitive     VANCOMYCIN 1 SENSITIVE Sensitive     TRIMETH/SULFA 80 RESISTANT Resistant     CLINDAMYCIN <=0.25 SENSITIVE Sensitive     RIFAMPIN <=0.5 SENSITIVE  Sensitive     Inducible Clindamycin NEGATIVE Sensitive     * 20,000 COLONIES/mL STAPHYLOCOCCUS EPIDERMIDIS  Expectorated Sputum Assessment w Gram Stain, Rflx to Resp Cult     Status: None   Collection Time: 05/27/20  1:15 PM   Specimen: Expectorated Sputum  Result Value Ref Range Status   Specimen Description EXPECTORATED SPUTUM  Final   Special Requests NONE  Final   Sputum evaluation   Final    Sputum specimen not acceptable for testing.  Please recollect.   RESULT CALLED TO, READ BACK BY AND VERIFIED WITH: A PINCHER RN 1425 05/27/20 A BROWNING Performed at [facility], [name] [address], [place], [place] [address]    Report Status 05/27/2020 FINAL  Final  Acid Fast Smear (AFB)     Status: None   Collection Time: 05/27/20  1:15 PM   Specimen: Sputum  Result Value Ref Range Status   AFB Specimen Processing Concentration  Final   Acid Fast Smear Negative  Final    Comment: (NOTE) Performed At: [facility] [place], [place] [id number] Rush Farmer MD [id number]    Source (AFB) EXPECTORATED  SPUTUM  Final    Comment: Performed at [facility], [place] [address] [place], [place] [address]  Culture, fungus without smear     Status: Abnormal (Preliminary result)   Collection Time: 05/27/20  1:15 PM   Specimen: Sputum; Other  Result Value Ref Range Status   Specimen Description EXPECTORATED SPUTUM  Final   Special Requests NONE  Final   Culture (A)  Final    CANDIDA GLABRATA CONTINUING TO HOLD Performed at [facility], [place] [address] [place], [place] [address]    Report Status PENDING  Incomplete    Radiology Reports CT Angio Chest PE W and/or Wo Contrast  Result Date: 05/25/2020 CLINICAL DATA:  75 year old male with shortness of breath and chest pain. Concern for pulmonary embolism. EXAM: CT ANGIOGRAPHY CHEST WITH CONTRAST TECHNIQUE: Multidetector CT imaging of the chest was performed using the standard protocol during bolus administration of intravenous contrast. Multiplanar CT image reconstructions and MIPs were obtained to evaluate the vascular anatomy. CONTRAST:  77mL OMNIPAQUE IOHEXOL 350 MG/ML SOLN COMPARISON:  Chest CT dated 05/14/2020. FINDINGS: Cardiovascular: Mild cardiomegaly. No pericardial effusion. There is coronary vascular calcification. Mild atherosclerotic calcification of the thoracic aorta. Linear nonocclusive thrombus noted extending from the central right pulmonary artery into the lobar and segmental branches of the right lower lobe and right upper lobe. This is new since the prior CT. No CT evidence of right heart straining. Mediastinum/Nodes: Top-normal right hilar lymph nodes. The esophagus and the thyroid gland are grossly unremarkable. No mediastinal fluid collection. Lungs/Pleura: Bilateral patchy and streaky densities similar to prior CT. Calcified pleural plaque noted in the left upper lobe anteriorly. No pleural effusion pneumothorax. The central airways are patent. Upper Abdomen: No acute abnormality. Musculoskeletal: No chest wall abnormality.  No acute or significant osseous findings. Review of the MIP images confirms the above findings. IMPRESSION: 1. Pulmonary artery embolus extending from the central right pulmonary artery into the lobar and segmental branches of the right lower lobe and right upper lobe, new since the prior CT. No CT evidence of right heart straining. 2. Bilateral patchy and streaky densities similar to prior CT. 3. Aortic Atherosclerosis (ICD10-I70.0). These results were called by telephone at the time of interpretation on 05/25/2020 at 6:39 pm to provider DAVID YAO , who verbally acknowledged these results. Electronically  Signed   By: Anner Crete M.D.   On: 05/25/2020 18:43   CT ANGIO CHEST PE W OR WO CONTRAST  Result Date: 05/14/2020 CLINICAL DATA:  High probability sepsis due to pneumonia EXAM: CT ANGIOGRAPHY CHEST WITH CONTRAST TECHNIQUE: Multidetector CT imaging of the chest was performed using the standard protocol during bolus administration of intravenous contrast. Multiplanar CT image reconstructions and MIPs were obtained to evaluate the vascular anatomy. CONTRAST:  84mL OMNIPAQUE IOHEXOL 350 MG/ML SOLN COMPARISON:  04/02/2020 FINDINGS: Cardiovascular: Satisfactory opacification by motion of pulmonary artifact. Arteries although limited there is a convincing branching pulmonary artery filling defect affecting segmental vessels in the basal right lower lobe as marked on series 6. Additional peripheral emboli could be present and obscured by the degree of motion artifact. No central embolism is seen. No evidence of right heart strain. Heart size is normal. No pericardial effusion. Atherosclerosis of the coronaries. Mediastinum/Nodes: Negative for adenopathy or mass. Lungs/Pleura: Chronic lung disease with reticulation. Bilateral pleural plaque appearance without calcification. There is increased ground-glass density scattered in the bilateral lungs, greatest in the right upper lobe. No interlobular septal thickening or  pleural fluid to implicate failure. Mild centrilobular emphysema. Upper Abdomen: Negative Musculoskeletal: No unexpected finding Review of the MIP images confirms the above findings. Critical Value/emergent results were called by telephone at the time of interpretation on 05/14/2020 at 8:35 am to provider Elgergawy, who verbally acknowledged these results. IMPRESSION: 1. Positive for segmental pulmonary embolism in the right lower lobe. Additional pulmonary emboli could be obscured by the degree of motion artifact. 2. Background of chronic opacities related to recent COVID infection. There is also new ground-glass opacity especially in the right upper lobe suggesting a superimposed acute pneumonia. 3. Aortic Atherosclerosis (ICD10-I70.0).  Coronary atherosclerosis. Electronically Signed   By: Monte Fantasia M.D.   On: 05/14/2020 08:35   MR BRAIN W WO CONTRAST  Result Date: 05/16/2020 CLINICAL DATA:  Delirium; fever with unknown origin, altered mental status, evaluate for encephalitis. EXAM: MRI HEAD WITHOUT AND WITH CONTRAST TECHNIQUE: Multiplanar, multiecho pulse sequences of the brain and surrounding structures were obtained without and with intravenous contrast. CONTRAST:  76mL GADAVIST GADOBUTROL 1 MMOL/ML IV SOLN COMPARISON:  Prior head CT examinations 04/02/2020 and earlier. Brain MRI 12/13/2017. FINDINGS: Brain: Mild cerebral and cerebellar atrophy. AP punctate focus of restricted diffusion is questioned within the right parietal lobe (versus artifact) (series 3, image 33). Additionally, there is apparent subtle restricted diffusion along the midline callosal splenium, slightly eccentric to the left. A few small foci of T2/FLAIR hyperintensity within the bilateral cerebral white matter and right thalamus are nonspecific, but likely reflect minimal chronic small vessel ischemic disease. Redemonstrated left retrocerebellar arachnoid cyst. There is no acute infarct. No evidence of intracranial mass. No chronic  intracranial blood products. No extra-axial fluid collection. No midline shift. No abnormal intracranial enhancement. Vascular: Expected proximal arterial flow voids. Skull and upper cervical spine: No focal marrow lesion. Incompletely assessed cervical spondylosis. Suspected C4-C5 vertebral body ankylosis. Sinuses/Orbits: Visualized orbits show no acute finding. Bilateral lens replacements. Mild paranasal sinus mucosal thickening, most notably within the bilateral ethmoid air cells. Small left maxillary sinus mucous retention cyst. Other: Right mastoid effusion. IMPRESSION: Punctate acute infarct versus artifact within the right parietal lobe. Additionally, there is apparent subtle restricted diffusion within the midline callosal splenium, slightly eccentric to the left. This may reflect a small focus of acute ischemia or artifact. Alternatively, this may reflect a cytotoxic lesion of the corpus callosum (which has  a broad range of potential etiologies and clinical associations), although the appearance would be atypical for this entity. Background mild parenchymal atrophy and chronic small vessel ischemic disease. Mild paranasal sinus disease as described. Right mastoid effusion. Electronically Signed   By: Kellie Simmering DO   On: 05/16/2020 18:51   MR CERVICAL SPINE W WO CONTRAST  Result Date: 05/17/2020 CLINICAL DATA:  Initial screening for possible epidural abscess. EXAM: MRI CERVICAL SPINE WITHOUT AND WITH CONTRAST TECHNIQUE: Multiplanar and multiecho pulse sequences of the cervical spine, to include the craniocervical junction and cervicothoracic junction, were obtained without and with intravenous contrast. CONTRAST:  80mL GADAVIST GADOBUTROL 1 MMOL/ML IV SOLN COMPARISON:  Prior MRI from 03/24/2010. FINDINGS: Alignment: Reversal of the normal cervical lordosis with apex at C3-4. Trace anterolisthesis of C3 on C4 and C7 on T1, chronic and facet mediated. Vertebrae: Vertebral body height maintained without  acute or chronic fracture. C4 and C5 vertebral bodies are largely ankylosed, likely degenerative. Bone marrow signal intensity within normal limits. No discrete or worrisome osseous lesions. No abnormal marrow edema or enhancement. No findings to suggest osteomyelitis discitis or septic arthritis. Cord: Normal signal and morphology. No epidural abscess or other collection. Posterior Fossa, vertebral arteries, paraspinal tissues: Probable benign retro cerebellar arachnoid cyst noted. Visualized brain and posterior fossa otherwise unremarkable. Craniocervical junction within normal limits. Paraspinous and prevertebral soft tissues normal. Normal flow voids seen within the vertebral arteries bilaterally. Disc levels: C2-C3: Negative interspace. Right worse than left facet hypertrophy. No spinal stenosis. Mild right C3 foraminal narrowing. Left neural foramina remains patent. C3-C4: Broad-based posterior disc osteophyte flattens and effaces the ventral thecal sac. Mild flattening of the ventral cord without cord signal changes. Mild spinal stenosis. Superimposed bilateral facet hypertrophy with left greater than right uncovertebral spurring. Severe left with moderate right C4 foraminal stenosis. C4-C5: C4 and C5 vertebral bodies are largely ankylosed. Broad posterior endplate osseous ridging flattens and effaces the ventral thecal sac. Mild spinal stenosis without cord impingement. Moderate right worse than left C5 foraminal stenosis. C5-C6: Degenerative intervertebral disc space narrowing with diffuse disc osteophyte complex. Mild flattening of the ventral thecal sac without significant spinal stenosis. Moderate left worse than right C6 foraminal narrowing. C6-C7: Degenerative intervertebral disc space narrowing with diffuse disc osteophyte complex. No significant spinal stenosis. Severe left with moderate right C7 foraminal stenosis. C7-T1: Trace anterolisthesis. No significant disc bulge. Moderate bilateral facet and  ligament flavum hypertrophy. No significant spinal stenosis. Foramina remain patent. IMPRESSION: 1. No evidence for osteomyelitis discitis or septic arthritis within the cervical spine. No epidural abscess or other infection. 2. Multilevel cervical spondylosis with resultant mild spinal stenosis at C3-4 and C4-5. 3. Multifactorial degenerative changes with resultant multilevel foraminal narrowing as above. Notable findings include severe left with moderate right C4 foraminal stenosis, moderate bilateral C5 and C6 foraminal narrowing, with severe left and moderate right C7 foraminal stenosis. Electronically Signed   By: Jeannine Boga M.D.   On: 05/17/2020 21:40   MR THORACIC SPINE W WO CONTRAST  Result Date: 05/17/2020 CLINICAL DATA:  Initial screening for possible infection, epidural abscess. EXAM: MRI THORACIC WITHOUT AND WITH CONTRAST TECHNIQUE: Multiplanar and multiecho pulse sequences of the thoracic spine were obtained without and with intravenous contrast. CONTRAST:  87mL GADAVIST GADOBUTROL 1 MMOL/ML IV SOLN COMPARISON:  None available. FINDINGS: Alignment: Straightening of the normal thoracic kyphosis. No listhesis. Vertebrae: Vertebral body height maintained without acute or chronic fracture. Bone marrow signal intensity within normal limits. Few scattered benign hemangiomata noted.  No worrisome osseous lesions. No abnormal marrow edema or enhancement. No findings to suggest osteomyelitis discitis or septic arthritis. Cord: Normal signal and morphology. No epidural abscess or other collection. No abnormal enhancement. Paraspinal and other soft tissues: Paraspinous soft tissues demonstrate no acute finding. Small layering bilateral pleural effusions noted, left slightly larger than right. T2 hyperintense benign appearing cyst partially visualized extending from the right kidney. Visualized visceral structures otherwise grossly unremarkable. Disc levels: T1-2:  Unremarkable. T2-3: Tiny right  paracentral disc extrusion with superior migration. Mild right-sided facet hypertrophy. No spinal stenosis. Foramina remain patent. T3-4: Minimal disc bulge. No spinal stenosis. Foramina remain patent. T4-5: Normal interspace. Mild right-sided facet hypertrophy. No stenosis. T5-6:  Normal interspace.  Mild facet hypertrophy.  No stenosis. T6-7:  Unremarkable. T7-8: Mild degenerative intervertebral disc space narrowing with disc bulge. No spinal stenosis. Foramina remain patent. T8-9: Mild disc bulge. No spinal stenosis. Foramina remain patent. T9-10: Mild disc bulge with posterior element hypertrophy. No significant spinal stenosis. Mild bilateral foraminal narrowing. T10-11: Diffuse disc bulge, asymmetric to the right. Moderate facet and ligament flavum hypertrophy. Resultant mild to moderate spinal stenosis with mild cord flattening. No cord signal changes. Moderate bilateral foraminal narrowing. T11-12: Negative interspace. Bilateral facet hypertrophy. No significant spinal stenosis. Mild bilateral foraminal narrowing. T12-L1: Disc desiccation with small endplate Schmorl's node deformities. No stenosis. IMPRESSION: 1. No MRI evidence for osteomyelitis discitis or septic arthritis within the thoracic spine. No epidural abscess or other infection. 2. Disc bulging with facet hypertrophy at T10-11 with resultant mild to moderate spinal stenosis and moderate bilateral foraminal narrowing. 3. Additional mild spondylosis elsewhere within the thoracic spine without significant stenosis. 4. Small layering bilateral pleural effusions, left slightly larger than right. Electronically Signed   By: Jeannine Boga M.D.   On: 05/17/2020 21:52   MR Lumbar Spine W Wo Contrast  Result Date: 05/13/2020 CLINICAL DATA:  Fever, lumbar spine tenderness EXAM: MRI LUMBAR SPINE WITHOUT AND WITH CONTRAST TECHNIQUE: Multiplanar and multiecho pulse sequences of the lumbar spine were obtained without and with intravenous contrast.  CONTRAST:  55mL GADAVIST GADOBUTROL 1 MMOL/ML IV SOLN COMPARISON:  04/03/2018 FINDINGS: Segmentation:  Standard. Alignment:  Stable. Vertebrae: There is now fusion across the L1-L2 disc space. There is no new marrow edema or enhancement. Multilevel degenerative plate irregularity and Schmorl's nodes. Conus medullaris and cauda equina: Conus extends to the L1-L2 level. Conus and cauda equina appear normal. No intrathecal enhancement. Paraspinal and other soft tissues: Mild lower lumbar paraspinal edema. No evidence of collection. Disc levels: L1-L2: Prior right laminectomy. Stable retrolisthesis with bridging endplate osteophytes effacing the ventral thecal sac. Similar mild to moderate foraminal stenosis. L2-L3: Disc bulge. Facet arthropathy with ligamentum flavum infolding. Slightly increased mild canal stenosis. Similar mild foraminal stenosis. L3-L4: Disc bulge. Facet arthropathy with ligamentum flavum infolding. Similar moderate canal stenosis with effacement of subarticular recesses. Similar mild to moderate left greater than right foraminal stenosis. L4-L5: Disc bulge with superimposed left subarticular/foraminal protrusion. Facet arthropathy with ligamentum flavum infolding. Similar mild canal stenosis. Similar partial effacement of the left subarticular recess. Similar mild foraminal stenosis. L5-S1: Disc bulge with endplate osteophytic ridging. Facet arthropathy. No canal stenosis. Similar mild foraminal stenosis. IMPRESSION: No evidence of discitis/osteomyelitis. Multilevel degenerative changes as detailed above without substantial change since the prior examination. Electronically Signed   By: Macy Mis M.D.   On: 05/13/2020 14:25   CT ABDOMEN PELVIS W CONTRAST  Result Date: 05/15/2020 CLINICAL DATA:  Sepsis.  Fever of unknown origin. EXAM:  CT ABDOMEN AND PELVIS WITH CONTRAST TECHNIQUE: Multidetector CT imaging of the abdomen and pelvis was performed using the standard protocol following bolus  administration of intravenous contrast. CONTRAST:  180mL OMNIPAQUE IOHEXOL 300 MG/ML  SOLN COMPARISON:  04/02/2020.  Chest CTA obtained earlier today. FINDINGS: Lower chest: Progressive pulmonary embolism on the right, with emboli currently demonstrated in the distal right main pulmonary artery and significantly more clot in the intersegmental and proximal right lower lobe pulmonary arteries. Bilateral lung densities and atelectasis with improvement since this morning. Hepatobiliary: Diffuse low density of the liver relative to the spleen. Normal appearing gallbladder. Pancreas: Unremarkable. No pancreatic ductal dilatation or surrounding inflammatory changes. Spleen: Normal in size without focal abnormality. Adrenals/Urinary Tract: Normal appearing adrenal glands. Bilateral renal cysts. Multiple small bilateral renal calculi. No bladder or ureteral calculi are seen. Excreted contrast in the urinary bladder from the CTA earlier today. Stomach/Bowel: Multiple colonic diverticula without evidence of diverticulitis. Unremarkable stomach, small bowel and appendix. Vascular/Lymphatic: Atheromatous arterial calcifications without aneurysm. No enlarged lymph nodes. Two renal arteries on the left. Reproductive: Mildly enlarged prostate gland. Other: Small bilateral inguinal hernias containing fat and very small umbilical artery containing fat. Musculoskeletal: Stable lumbar and lower thoracic spine degenerative changes and fusion of the L1 and L2 vertebral bodies. IMPRESSION: 1. Progressive pulmonary embolism on the right, with emboli currently demonstrated in the distal right main pulmonary artery and significantly more clot in the intersegmental and proximal right lower lobe pulmonary arteries. These results will be called to the ordering clinician or representative by the Radiologist Assistant, and communication documented in the PACS or [employer]. 2. Lung densities and atelectasis with improvement since this  morning. 3. Diffuse hepatic steatosis. 4. Multiple small, nonobstructing bilateral renal calculi. 5. Colonic diverticulosis. Electronically Signed   By: Claudie Revering M.D.   On: 05/15/2020 22:17   VAS [place] IVC/ILIAC (VENOUS ONLY)  Result Date: 05/11/2020 IVC/ILIAC STUDY Indications: DVT, recent COVID Limitations: Air/bowel gas.  Comparison Study: No prior study Performing Technologist: Maudry Mayhew MHA, RDMS, RVT, RDCS  Examination Guidelines: A complete evaluation includes B-mode imaging, spectral Doppler, color Doppler, and power Doppler as needed of all accessible portions of each vessel. Bilateral testing is considered an integral part of a complete examination. Limited examinations for reoccurring indications may be performed as noted.  IVC/Iliac Findings: +----------+------+--------+--------+    IVC    PatentThrombusComments +----------+------+--------+--------+ IVC Mid   patent                 +----------+------+--------+--------+ IVC Distalpatent                 +----------+------+--------+--------+  +-------------------+---------+-----------+---------+-----------+--------+         CIV        RT-PatentRT-ThrombusLT-PatentLT-ThrombusComments +-------------------+---------+-----------+---------+-----------+--------+ Common Iliac Prox                       patent                      +-------------------+---------+-----------+---------+-----------+--------+ Common Iliac Mid                        patent                      +-------------------+---------+-----------+---------+-----------+--------+ Common Iliac Distal patent              patent                      +-------------------+---------+-----------+---------+-----------+--------+  +-------------------------+---------+-----------+---------+-----------+--------+  EIV           RT-PatentRT-ThrombusLT-PatentLT-ThrombusComments  +-------------------------+---------+-----------+---------+-----------+--------+ External Iliac Vein Prox                      patent                      +-------------------------+---------+-----------+---------+-----------+--------+ External Iliac Vein       patent                                          Distal                                                                    +-------------------------+---------+-----------+---------+-----------+--------+   Summary: IVC/Iliac: No evidence of thrombus in IVC and Iliac veins.  *See table(s) above for measurements and observations.  Electronically signed by Harold Barban MD on 05/11/2020 at 9:55:50 PM.    Final    DG Chest Port 1 View  Result Date: 05/30/2020 CLINICAL DATA:  Difficulty breathing, low O2 sats, history of COVID infection. EXAM: PORTABLE CHEST 1 VIEW COMPARISON:  05/29/2020 and CT chest 05/25/2020. FINDINGS: Heart size stable. Patchy mixed interstitial and airspace opacification bilaterally, increased from 05/29/2020. No pleural fluid. IMPRESSION: Worsening COVID-19 pneumonia. Electronically Signed   By: Lorin Picket M.D.   On: 05/30/2020 09:11   DG Chest Port 1 View  Result Date: 05/29/2020 CLINICAL DATA:  Shortness of breath.  Covid-19 viral infection. EXAM: PORTABLE CHEST 1 VIEW COMPARISON:  05/28/2020 FINDINGS: Heart size remains within normal limits. Diffuse interstitial infiltrates are again seen with mild patchy airspace opacities in the left lung base. These show no significant change since previous study. No evidence of pleural effusion. IMPRESSION: No significant change in diffuse interstitial infiltrates with mild patchy airspace disease in left lower lung. Electronically Signed   By: Marlaine Hind M.D.   On: 05/29/2020 08:36   DG Chest Port 1 View  Result Date: 05/28/2020 CLINICAL DATA:  Shortness of breath EXAM: PORTABLE CHEST 1 VIEW COMPARISON:  Three days ago FINDINGS: Patchy bilateral pulmonary  infiltrate without convincing progression. Lung volumes are low. No visible effusion or air leak. Normal heart size and stable mediastinal contours. IMPRESSION: Similar degree of bilateral pulmonary infiltrates. No new abnormality. Electronically Signed   By: Monte Fantasia M.D.   On: 05/28/2020 07:38   DG Chest Port 1 View  Result Date: 05/25/2020 CLINICAL DATA:  75 year old male with concern for sepsis. EXAM: PORTABLE CHEST 1 VIEW COMPARISON:  Chest CT dated 05/14/2020. FINDINGS: Diffuse bilateral streaky interstitial densities worsened since 05/09/2020 but relatively similar to 05/10/2020 concerning for residual infiltrate, likely on the background of chronic changes. Clinical correlation and follow-up recommended. No lobar consolidation, pleural effusion, pneumothorax. The cardiac silhouette is within limits. No acute osseous pathology. IMPRESSION: Persistent bilateral infiltrates. Electronically Signed   By: Anner Crete M.D.   On: 05/25/2020 16:24   DG Chest Port 1 View  Result Date: 05/10/2020 CLINICAL DATA:  Short of breath, history of COPD EXAM: PORTABLE CHEST 1 VIEW COMPARISON:  Prior chest x-ray dated yesterday FINDINGS: Stable cardiac and mediastinal contours. Marked interval increase in  diffuse interstitial prominence bilaterally now with Kerley B-lines in the periphery. Findings are most consistent with worsening CHF. No large effusion or pneumothorax. No acute osseous abnormality. IMPRESSION: Increasing interstitial pulmonary edema concerning for progressive CHF. Electronically Signed   By: Jacqulynn Cadet M.D.   On: 05/10/2020 07:54   DG Chest Port 1 View  Result Date: 05/09/2020 CLINICAL DATA:  Possible sepsis Fever and cough EXAM: PORTABLE CHEST 1 VIEW COMPARISON:  04/04/2020 FINDINGS: Heart size within normal limits. No pulmonary vascular congestion. Interval improvement in aeration of the lungs with bilateral airspace opacities still remaining. IMPRESSION: Interval improvement in  aeration of the lungs with bilateral opacities still remaining consistent with resolving pneumonia. Electronically Signed   By: Miachel Roux M.D.   On: 05/09/2020 12:56   ECHOCARDIOGRAM COMPLETE  Result Date: 05/26/2020    ECHOCARDIOGRAM REPORT   Patient Name:   KRYSTLE MAHESHWARI Date of Exam: 05/26/2020 Medical Rec #:  [id number]      Height:       74.0 in Accession #:    [id number]     Weight:       194.0 lb Date of Birth:  [birth year]      BSA:          2.146 m Patient Age:    33 years       BP:           129/92 mmHg Patient Gender: M              HR:           84 bpm. Exam Location:  Inpatient Procedure: 2D Echo, Cardiac Doppler, Color Doppler and Intracardiac            Opacification Agent Indications:    Pulmonary Embolus I26.09  History:        Patient has prior history of Echocardiogram examinations, most                 recent 04/03/2020. CAD and Previous Myocardial Infarction, COPD,                 Signs/Symptoms:Shortness of Breath; Risk Factors:Hypertension                 and Dyslipidemia. COVID 19. Sepsis, due to unspecified organism,                 unspecified whether acute organ dysfunction present                 Multiple subsegmental pulmonary emboli without acute cor                 pulmonale.  Sonographer:    Darlina Sicilian RDCS Referring Phys: [id number] [place]  1. Left ventricular ejection fraction, by estimation, is 50 to 55%. The left ventricle has low normal function. The left ventricle has no regional wall motion abnormalities. There is severe asymmetric left ventricular hypertrophy of the basal and septal  segments. Left ventricular diastolic parameters are consistent with Grade II diastolic dysfunction (pseudonormalization). Elevated left ventricular end-diastolic pressure.  2. Right ventricular systolic function is normal. The right ventricular size is normal.  3. The mitral valve is abnormal. No evidence of mitral valve regurgitation. No evidence of mitral stenosis.   4. The aortic valve is tricuspid. There is mild calcification of the aortic valve. Aortic valve regurgitation is not visualized. Mild aortic valve sclerosis is present, with no evidence of aortic valve stenosis.  5. Aortic dilatation noted.  There is mild dilatation of the aortic root, measuring 41 mm.  6. The inferior vena cava is normal in size with greater than 50% respiratory variability, suggesting right atrial pressure of 3 mmHg. FINDINGS  Left Ventricle: Left ventricular ejection fraction, by estimation, is 50 to 55%. The left ventricle has low normal function. The left ventricle has no regional wall motion abnormalities. Definity contrast agent was given IV to delineate the left ventricular endocardial borders. The left ventricular internal cavity size was normal in size. There is severe asymmetric left ventricular hypertrophy of the basal and septal segments. Left ventricular diastolic parameters are consistent with Grade II diastolic dysfunction (pseudonormalization). Elevated left ventricular end-diastolic pressure. Right Ventricle: The right ventricular size is normal. No increase in right ventricular wall thickness. Right ventricular systolic function is normal. Left Atrium: Left atrial size was normal in size. Right Atrium: Right atrial size was normal in size. Pericardium: There is no evidence of pericardial effusion. Mitral Valve: The mitral valve is abnormal. There is mild thickening of the mitral valve leaflet(s). There is mild calcification of the mitral valve leaflet(s). Mild mitral annular calcification. No evidence of mitral valve regurgitation. No evidence of mitral valve stenosis. Tricuspid Valve: The tricuspid valve is normal in structure. Tricuspid valve regurgitation is not demonstrated. No evidence of tricuspid stenosis. Aortic Valve: The aortic valve is tricuspid. There is mild calcification of the aortic valve. Aortic valve regurgitation is not visualized. Mild aortic valve sclerosis is  present, with no evidence of aortic valve stenosis. Pulmonic Valve: The pulmonic valve was normal in structure. Pulmonic valve regurgitation is not visualized. No evidence of pulmonic stenosis. Aorta: The aortic root is normal in size and structure and aortic dilatation noted. There is mild dilatation of the aortic root, measuring 41 mm. Venous: The inferior vena cava is normal in size with greater than 50% respiratory variability, suggesting right atrial pressure of 3 mmHg. IAS/Shunts: No atrial level shunt detected by color flow Doppler.  LEFT VENTRICLE PLAX 2D LVIDd:         3.20 cm     Diastology LVIDs:         2.60 cm     LV e' medial:    3.15 cm/s LV PW:         1.40 cm     LV E/e' medial:  17.1 LV IVS:        2.00 cm     LV e' lateral:   3.37 cm/s LVOT diam:     2.00 cm     LV E/e' lateral: 16.0 LV SV:         41 LV SV Index:   19 LVOT Area:     3.14 cm  LV Volumes (MOD) LV vol d, MOD A2C: 91.2 ml LV vol d, MOD A4C: 78.4 ml LV vol s, MOD A2C: 43.8 ml LV vol s, MOD A4C: 36.8 ml LV SV MOD A2C:     47.4 ml LV SV MOD A4C:     78.4 ml LV SV MOD BP:      44.4 ml RIGHT VENTRICLE RV S prime:     8.16 cm/s LEFT ATRIUM             Index       RIGHT ATRIUM          Index LA diam:        3.00 cm 1.40 cm/m  RA Area:     6.38 cm LA Vol (A2C):   20.1  ml 9.37 ml/m  RA Volume:   7.69 ml  3.58 ml/m LA Vol (A4C):   21.3 ml 9.93 ml/m LA Biplane Vol: 21.6 ml 10.07 ml/m  AORTIC VALVE LVOT Vmax:   80.50 cm/s LVOT Vmean:  60.800 cm/s LVOT VTI:    0.130 m  AORTA Ao Root diam: 4.10 cm Ao Asc diam:  3.40 cm MITRAL VALVE MV Area (PHT): 3.01 cm    SHUNTS MV Decel Time: 252 msec    Systemic VTI:  0.13 m MV E velocity: 53.80 cm/s  Systemic Diam: 2.00 cm MV A velocity: 81.50 cm/s MV E/A ratio:  0.66 Jenkins Rouge MD Electronically signed by Jenkins Rouge MD Signature Date/Time: 05/26/2020/2:30:51 PM    Final    VAS [place] LOWER EXTREMITY VENOUS (DVT)  Result Date: 05/26/2020  Lower Venous DVT Study Indications: Edema, and Swelling.   Comparison Study: 05/11/20 previous Performing Technologist: Abram Sander RVS  Examination Guidelines: A complete evaluation includes B-mode imaging, spectral Doppler, color Doppler, and power Doppler as needed of all accessible portions of each vessel. Bilateral testing is considered an integral part of a complete examination. Limited examinations for reoccurring indications may be performed as noted. The reflux portion of the exam is performed with the patient in reverse Trendelenburg.  +---------+---------------+---------+-----------+----------+--------------+ RIGHT    CompressibilityPhasicitySpontaneityPropertiesThrombus Aging +---------+---------------+---------+-----------+----------+--------------+ CFV      Full           Yes      Yes                                 +---------+---------------+---------+-----------+----------+--------------+ SFJ      Full                                                        +---------+---------------+---------+-----------+----------+--------------+ FV Prox  Full                                                        +---------+---------------+---------+-----------+----------+--------------+ FV Mid   Full                                                        +---------+---------------+---------+-----------+----------+--------------+ FV DistalFull                                                        +---------+---------------+---------+-----------+----------+--------------+ PFV      Full                                                        +---------+---------------+---------+-----------+----------+--------------+ POP      Full  Yes      Yes                                 +---------+---------------+---------+-----------+----------+--------------+ PTV      Full                                                        +---------+---------------+---------+-----------+----------+--------------+ PERO      Full                                                        +---------+---------------+---------+-----------+----------+--------------+   +---------+---------------+---------+-----------+----------+-----------------+ LEFT     CompressibilityPhasicitySpontaneityPropertiesThrombus Aging    +---------+---------------+---------+-----------+----------+-----------------+ CFV      Full           Yes      Yes                                    +---------+---------------+---------+-----------+----------+-----------------+ SFJ      Full                                                           +---------+---------------+---------+-----------+----------+-----------------+ FV Prox  Full                                                           +---------+---------------+---------+-----------+----------+-----------------+ FV Mid   Full                                                           +---------+---------------+---------+-----------+----------+-----------------+ FV DistalFull                                                           +---------+---------------+---------+-----------+----------+-----------------+ PFV      Full                                                           +---------+---------------+---------+-----------+----------+-----------------+ POP      None           No       No  Age Indeterminate +---------+---------------+---------+-----------+----------+-----------------+ PTV      None                                         Age Indeterminate +---------+---------------+---------+-----------+----------+-----------------+ PERO     None                                         Age Indeterminate +---------+---------------+---------+-----------+----------+-----------------+     Summary: RIGHT: - There is no evidence of deep vein thrombosis in the lower extremity.  - No cystic structure found in the popliteal fossa.   LEFT: - Findings consistent with age indeterminate deep vein thrombosis involving the left popliteal vein, left posterior tibial veins, and left peroneal veins. - No cystic structure found in the popliteal fossa.  *See table(s) above for measurements and observations. Electronically signed by Harold Barban MD on 05/26/2020 at 9:28:41 PM.    Final    VAS [place] LOWER EXTREMITY VENOUS (DVT)  Result Date: 05/11/2020  Lower Venous DVT Study Indications: Edema, Recent Covid infection, and SOB.  Comparison Study: 03/31/20 negative bilateral lower extremity venous duplex Performing Technologist: Maudry Mayhew MHA, RDMS, RVT, RDCS  Examination Guidelines: A complete evaluation includes B-mode imaging, spectral Doppler, color Doppler, and power Doppler as needed of all accessible portions of each vessel. Bilateral testing is considered an integral part of a complete examination. Limited examinations for reoccurring indications may be performed as noted. The reflux portion of the exam is performed with the patient in reverse Trendelenburg.  +---------+---------------+---------+-----------+----------+--------------+ RIGHT    CompressibilityPhasicitySpontaneityPropertiesThrombus Aging +---------+---------------+---------+-----------+----------+--------------+ CFV      Full           Yes      Yes                                 +---------+---------------+---------+-----------+----------+--------------+ SFJ      Full                                                        +---------+---------------+---------+-----------+----------+--------------+ FV Prox  Full                                                        +---------+---------------+---------+-----------+----------+--------------+ FV Mid   Full                                                        +---------+---------------+---------+-----------+----------+--------------+ FV DistalFull                                                         +---------+---------------+---------+-----------+----------+--------------+  PFV      Full                                                        +---------+---------------+---------+-----------+----------+--------------+ POP      Full           Yes      Yes                                 +---------+---------------+---------+-----------+----------+--------------+ PTV      Full                                                        +---------+---------------+---------+-----------+----------+--------------+ PERO     Full                                                        +---------+---------------+---------+-----------+----------+--------------+ Gastroc  None                    No                   Acute          +---------+---------------+---------+-----------+----------+--------------+   +---------+---------------+---------+-----------+----------+--------------+ LEFT     CompressibilityPhasicitySpontaneityPropertiesThrombus Aging +---------+---------------+---------+-----------+----------+--------------+ CFV      Full           Yes      Yes                                 +---------+---------------+---------+-----------+----------+--------------+ SFJ      Full                                                        +---------+---------------+---------+-----------+----------+--------------+ FV Prox  None                    No                   Acute          +---------+---------------+---------+-----------+----------+--------------+ FV Mid   None                    No                   Acute          +---------+---------------+---------+-----------+----------+--------------+ FV DistalNone                    No                   Acute          +---------+---------------+---------+-----------+----------+--------------+ PFV      Full                                                         +---------+---------------+---------+-----------+----------+--------------+  POP      None                    No                   Acute          +---------+---------------+---------+-----------+----------+--------------+ PTV      None                    No                   Acute          +---------+---------------+---------+-----------+----------+--------------+ PERO     None                    No                   Acute          +---------+---------------+---------+-----------+----------+--------------+     Summary: RIGHT: - Findings consistent with acute deep vein thrombosis involving the right gastrocnemius veins. - No cystic structure found in the popliteal fossa.  LEFT: - Findings consistent with acute deep vein thrombosis involving the left femoral vein, left popliteal vein, left posterior tibial veins, and left peroneal veins. - No cystic structure found in the popliteal fossa.  *See table(s) above for measurements and observations. Electronically signed by Harold Barban MD on 05/11/2020 at 9:56:21 PM.    Final

## 2020-06-01 NOTE — Progress Notes (Signed)
OT Cancellation Note  Patient Details Name: Tyrone Roman MRN: [id number] DOB: 06/23/1945   Cancelled Treatment:    Reason Eval/Treat Not Completed: Patient declined, no reason specified. Pt states that he wanted to eat his pineapples and is waiting on his daughter any minute to discuss some things     Britt Bottom 06/01/2020, 1:25 PM

## 2020-06-01 NOTE — Discharge Instructions (Addendum)
Follow with Primary MD [name], MD in 7 days   Get CBC, CMP, 2 view Chest X ray -  checked next visit within 1 week by Primary MD   Activity: As tolerated with Full fall precautions use walker/cane & assistance as needed  Disposition Home    Diet: Heart Healthy Low Carb   Accuchecks 4 times/day, Once in AM empty stomach and then before each meal. Log in all results and show them to your Prim.MD in 3 days. If any glucose reading is under 80 or above 300 call your Prim MD immidiately. Follow Low glucose instructions for glucose under 80 as instructed. Marland Kitchen  Special Instructions: If you have smoked or chewed Tobacco  in the last 2 yrs please stop smoking, stop any regular Alcohol  and or any Recreational drug use.  On your next visit with your primary care physician please Get Medicines reviewed and adjusted.  Please request your Prim.MD to go over all Hospital Tests and Procedure/Radiological results at the follow up, please get all Hospital records sent to your Prim MD by signing hospital release before you go home.  If you experience worsening of your admission symptoms, develop shortness of breath, life threatening emergency, suicidal or homicidal thoughts you must seek medical attention immediately by calling 911 or calling your MD immediately  if symptoms less severe.  You Must read complete instructions/literature along with all the possible adverse reactions/side effects for all the Medicines you take and that have been prescribed to you. Take any new Medicines after you have completely understood and accpet all the possible adverse reactions/side effects.         Person Under Monitoring Name: Tyrone Roman  Location: [address] [place] [address]   Infection Prevention Recommendations for Individuals Confirmed to have, or Being Evaluated for, 2019 Novel Coronavirus (COVID-19) Infection Who Receive Care at Home  Individuals who are confirmed to have, or  are being evaluated for, COVID-19 should follow the prevention steps below until a healthcare provider or local or state health department says they can return to normal activities.  Stay home except to get medical care You should restrict activities outside your home, except for getting medical care. Do not go to work, school, or public areas, and do not use public transportation or taxis.  Call ahead before visiting your doctor Before your medical appointment, call the healthcare provider and tell them that you have, or are being evaluated for, COVID-19 infection. This will help the healthcare provider's office take steps to keep other people from getting infected. Ask your healthcare provider to call the local or state health department.  Monitor your symptoms Seek prompt medical attention if your illness is worsening (e.g., difficulty breathing). Before going to your medical appointment, call the healthcare provider and tell them that you have, or are being evaluated for, COVID-19 infection. Ask your healthcare provider to call the local or state health department.  Wear a facemask You should wear a facemask that covers your nose and mouth when you are in the same room with other people and when you visit a healthcare provider. People who live with or visit you should also wear a facemask while they are in the same room with you.  Separate yourself from other people in your home As much as possible, you should stay in a different room from other people in your home. Also, you should use a separate bathroom, if available.  Avoid sharing household items You should  not share dishes, drinking glasses, cups, eating utensils, towels, bedding, or other items with other people in your home. After using these items, you should wash them thoroughly with soap and water.  Cover your coughs and sneezes Cover your mouth and nose with a tissue when you cough or sneeze, or you can cough or sneeze  into your sleeve. Throw used tissues in a lined trash can, and immediately wash your hands with soap and water for at least 20 seconds or use an alcohol-based hand rub.  Wash your [facility] your hands often and thoroughly with soap and water for at least 20 seconds. You can use an alcohol-based hand sanitizer if soap and water are not available and if your hands are not visibly dirty. Avoid touching your eyes, nose, and mouth with unwashed hands.   Prevention Steps for Caregivers and Household Members of Individuals Confirmed to have, or Being Evaluated for, COVID-19 Infection Being Cared for in the Home  If you live with, or provide care at home for, a person confirmed to have, or being evaluated for, COVID-19 infection please follow these guidelines to prevent infection:  Follow healthcare provider's instructions Make sure that you understand and can help the patient follow any healthcare provider instructions for all care.  Provide for the patient's basic needs You should help the patient with basic needs in the home and provide support for getting groceries, prescriptions, and other personal needs.  Monitor the patient's symptoms If they are getting sicker, call his or her medical provider and tell them that the patient has, or is being evaluated for, COVID-19 infection. This will help the healthcare provider's office take steps to keep other people from getting infected. Ask the healthcare provider to call the local or state health department.  Limit the number of people who have contact with the patient  If possible, have only one caregiver for the patient.  Other household members should stay in another home or place of residence. If this is not possible, they should stay  in another room, or be separated from the patient as much as possible. Use a separate bathroom, if available.  Restrict visitors who do not have an essential need to be in the home.  Keep older adults,  very young children, and other sick people away from the patient Keep older adults, very young children, and those who have compromised immune systems or chronic health conditions away from the patient. This includes people with chronic heart, lung, or kidney conditions, diabetes, and cancer.  Ensure good ventilation Make sure that shared spaces in the home have good air flow, such as from an air conditioner or an opened window, weather permitting.  Wash your hands often  Wash your hands often and thoroughly with soap and water for at least 20 seconds. You can use an alcohol based hand sanitizer if soap and water are not available and if your hands are not visibly dirty.  Avoid touching your eyes, nose, and mouth with unwashed hands.  Use disposable paper towels to dry your hands. If not available, use dedicated cloth towels and replace them when they become wet.  Wear a facemask and gloves  Wear a disposable facemask at all times in the room and gloves when you touch or have contact with the patient's blood, body fluids, and/or secretions or excretions, such as sweat, saliva, sputum, nasal mucus, vomit, urine, or feces.  Ensure the mask fits over your nose and mouth tightly, and do not touch  it during use.  Throw out disposable facemasks and gloves after using them. Do not reuse.  Wash your hands immediately after removing your facemask and gloves.  If your personal clothing becomes contaminated, carefully remove clothing and launder. Wash your hands after handling contaminated clothing.  Place all used disposable facemasks, gloves, and other waste in a lined container before disposing them with other household waste.  Remove gloves and wash your hands immediately after handling these items.  Do not share dishes, glasses, or other household items with the patient  Avoid sharing household items. You should not share dishes, drinking glasses, cups, eating utensils, towels, bedding, or  other items with a patient who is confirmed to have, or being evaluated for, COVID-19 infection.  After the person uses these items, you should wash them thoroughly with soap and water.  Wash laundry thoroughly  Immediately remove and wash clothes or bedding that have blood, body fluids, and/or secretions or excretions, such as sweat, saliva, sputum, nasal mucus, vomit, urine, or feces, on them.  Wear gloves when handling laundry from the patient.  Read and follow directions on labels of laundry or clothing items and detergent. In general, wash and dry with the warmest temperatures recommended on the label.  Clean all areas the individual has used often  Clean all touchable surfaces, such as counters, tabletops, doorknobs, bathroom fixtures, toilets, phones, keyboards, tablets, and bedside tables, every day. Also, clean any surfaces that may have blood, body fluids, and/or secretions or excretions on them.  Wear gloves when cleaning surfaces the patient has come in contact with.  Use a diluted bleach solution (e.g., dilute bleach with 1 part bleach and 10 parts water) or a household disinfectant with a label that says EPA-registered for coronaviruses. To make a bleach solution at home, add 1 tablespoon of bleach to 1 quart (4 cups) of water. For a larger supply, add  cup of bleach to 1 gallon (16 cups) of water.  Read labels of cleaning products and follow recommendations provided on product labels. Labels contain instructions for safe and effective use of the cleaning product including precautions you should take when applying the product, such as wearing gloves or eye protection and making sure you have good ventilation during use of the product.  Remove gloves and wash hands immediately after cleaning.  Monitor yourself for signs and symptoms of illness Caregivers and household members are considered close contacts, should monitor their health, and will be asked to limit movement  outside of the home to the extent possible. Follow the monitoring steps for close contacts listed on the symptom monitoring form.   ? If you have additional questions, contact your local health department or call the epidemiologist on call at [phone] (available 24/7). ? This guidance is subject to change. For the most up-to-date guidance from [facility], please refer to their website: YouBlogs.pl  Information on my medicine - XARELTO (rivaroxaban)  [place]? Xarelto was prescribed to treat blood clots that may have been found in the veins of your legs (deep vein thrombosis) or in your lungs (pulmonary embolism) and to reduce the risk of them occurring again.  What do you need to know about Xarelto? The starting dose is one 15 mg tablet taken TWICE daily with food for the FIRST 14 DAYS then on 06/16/2020  the dose is changed to one 20 mg tablet taken ONCE A DAY with your evening meal.  DO NOT stop taking Xarelto without talking to the  health care provider who prescribed the medication.  Refill your prescription for 20 mg tablets before you run out.  After discharge, you should have regular check-up appointments with your healthcare provider that is prescribing your Xarelto.  In the future your dose may need to be changed if your kidney function changes by a significant amount.  What do you do if you miss a dose? If you are taking Xarelto TWICE DAILY and you miss a dose, take it as soon as you remember. You may take two 15 mg tablets (total 30 mg) at the same time then resume your regularly scheduled 15 mg twice daily the next day.  If you are taking Xarelto ONCE DAILY and you miss a dose, take it as soon as you remember on the same day then continue your regularly scheduled once daily regimen the next day. Do not take two doses of Xarelto at the same time.   Important Safety Information Xarelto  is a blood thinner medicine that can cause bleeding. You should call your healthcare provider right away if you experience any of the following: ? Bleeding from an injury or your nose that does not stop. ? Unusual colored urine (red or dark brown) or unusual colored stools (red or black). ? Unusual bruising for unknown reasons. ? A serious fall or if you hit your head (even if there is no bleeding).  Some medicines may interact with Xarelto and might increase your risk of bleeding while on Xarelto. To help avoid this, consult your healthcare provider or pharmacist prior to using any new prescription or non-prescription medications, including herbals, vitamins, non-steroidal anti-inflammatory drugs (NSAIDs) and supplements.  This website has more information on Xarelto: [email or web address]://guerra-benson.com/.    ======================================================  Pulmonary Embolism    A pulmonary embolism (PE) is a sudden blockage or decrease of blood flow in one or both lungs. Most blockages come from a blood clot that forms in the vein of a lower leg, thigh, or arm (deep vein thrombosis, DVT) and travels to the lungs. A clot is blood that has thickened into a gel or solid. PE is a dangerous and life-threatening condition that needs to be treated right away.  What are the causes? This condition is usually caused by a blood clot that forms in a vein and moves to the lungs. In rare cases, it may be caused by air, fat, part of a tumor, or other tissue that moves through the veins and into the lungs.  What increases the risk? The following factors may make you more likely to develop this condition: 1. Experiencing a traumatic injury, such as breaking a hip or leg. 2. Having: ? A spinal cord injury. ? Orthopedic surgery, especially hip or knee replacement. ? Any major surgery. ? A stroke. ? DVT. ? Blood clots or blood clotting disease. ? Long-term (chronic) lung or heart disease. ? Cancer treated with  chemotherapy. ? A central venous catheter. 3. Taking medicines that contain estrogen. These include birth control pills and hormone replacement therapy. 4. Being: ? Pregnant. ? In the period of time after your baby is delivered (postpartum). ? Older than age 54. ? Overweight. ? A smoker, especially if you have other risks.  What are the signs or symptoms? Symptoms of this condition usually start suddenly and include:  Shortness of breath during activity or at rest.  Coughing, coughing up blood, or coughing up blood-tinged mucus.  Chest pain that is often worse with deep breaths.  Rapid or irregular heartbeat.  Feeling light-headed or dizzy.  Fainting.  Feeling anxious.  Fever.  Sweating.  Pain and swelling in a leg. This is a symptom of DVT, which can lead to PE. How is this diagnosed? This condition may be diagnosed based on:  Your medical history.  A physical exam.  Blood tests.  CT pulmonary angiogram. This test checks blood flow in and around your lungs.  Ventilation-perfusion scan, also called a lung VQ scan. This test measures air flow and blood flow to the lungs.  An ultrasound of the legs.  How is this treated? Treatment for this condition depends on many factors, such as the cause of your PE, your risk for bleeding or developing more clots, and other medical conditions you have. Treatment aims to remove, dissolve, or stop blood clots from forming or growing larger. Treatment may include: 1. Medicines, such as: ? Blood thinning medicines (anticoagulants) to stop clots from forming. ? Medicines that dissolve clots (thrombolytics). 2. Procedures, such as: ? Using a flexible tube to remove a blood clot (embolectomy) or to deliver medicine to destroy it (catheter-directed thrombolysis). ? Inserting a filter into a large vein that carries blood to the heart (inferior vena cava). This filter (vena cava filter) catches blood clots before they reach the  lungs. ? Surgery to remove the clot (surgical embolectomy). This is rare. You may need a combination of immediate, long-term (up to 3 months after diagnosis), and extended (more than 3 months after diagnosis) treatments. Your treatment may continue for several months (maintenance therapy). You and your health care provider will work together to choose the treatment program that is best for you.  Follow these instructions at home: Medicines 1. Take over-the-counter and prescription medicines only as told by your health care provider. 2. If you are taking an anticoagulant medicine: ? Take the medicine every day at the same time each day. ? Understand what foods and drugs interact with your medicine. ? Understand the side effects of this medicine, including excessive bruising or bleeding. Ask your health care provider or pharmacist about other side effects.  General instructions  Wear a medical alert bracelet or carry a medical alert card that says you have had a PE and lists what medicines you take.  Ask your health care provider when you may return to your normal activities. Avoid sitting or lying for a long time without moving.  Maintain a healthy weight. Ask your health care provider what weight is healthy for you.  Do not use any products that contain nicotine or tobacco, such as cigarettes, e-cigarettes, and chewing tobacco. If you need help quitting, ask your health care provider.  Talk with your health care provider about any travel plans. It is important to make sure that you are still able to take your medicine while on trips.  Keep all follow-up visits as told by your health care provider. This is important.  Contact a health care provider if:  You missed a dose of your blood thinner medicine.  Get help right away if: 1. You have: ? New or increased pain, swelling, warmth, or redness in an arm or leg. ? Numbness or tingling in an arm or leg. ? Shortness of breath during  activity or at rest. ? A fever. ? Chest pain. ? A rapid or irregular heartbeat. ? A severe headache. ? Vision changes. ? A serious fall or accident, or you hit your head. ? Stomach (abdominal) pain. ? Blood in your vomit, stool, or urine. ? A cut  that will not stop bleeding. 2. You cough up blood. 3. You feel light-headed or dizzy. 4. You cannot move your arms or legs. 5. You are confused or have memory loss.  These symptoms may represent a serious problem that is an emergency. Do not wait to see if the symptoms will go away. Get medical help right away. Call your local emergency services (911 in the [place] Do not drive yourself to the hospital. Summary  A pulmonary embolism (PE) is a sudden blockage or decrease of blood flow in one or both lungs. PE is a dangerous and life-threatening condition that needs to be treated right away.  Treatments for this condition usually include medicines to thin your blood (anticoagulants) or medicines to break apart blood clots (thrombolytics).  If you are given blood thinners, it is important to take the medicine every day at the same time each day.  Understand what foods and drugs interact with any medicines that you are taking.  If you have signs of PE or DVT, call your local emergency services (911 in the [place] This information is not intended to replace advice given to you by your health care provider. Make sure you discuss any questions you have with your health care provider. Document Revised: 10/30/2017 Document Reviewed: 10/30/2017 Elsevier Patient Education  2020 [employer].

## 2020-06-02 ENCOUNTER — Other Ambulatory Visit (HOSPITAL_COMMUNITY): Payer: Self-pay

## 2020-06-02 LAB — GLUCOSE, CAPILLARY
Glucose-Capillary: 161 mg/dL — ABNORMAL HIGH (ref 70–99)
Glucose-Capillary: 192 mg/dL — ABNORMAL HIGH (ref 70–99)

## 2020-06-02 MED ORDER — SODIUM POLYSTYRENE SULFONATE 15 GM/60ML PO SUSP
30.0000 g | Freq: Once | ORAL | Status: DC
  Filled 2020-06-02: qty 120

## 2020-06-02 MED ORDER — RIVAROXABAN 15 MG PO TABS
15.0000 mg | ORAL_TABLET | Freq: Two times a day (BID) | ORAL | 0 refills | Status: AC
  Filled 2020-06-02: qty 26, 13d supply, fill #0

## 2020-06-02 MED ORDER — INSULIN LISPRO (1 UNIT DIAL) 100 UNIT/ML (KWIKPEN)
PEN_INJECTOR | SUBCUTANEOUS | 0 refills | Status: AC
  Filled 2020-06-02: qty 15, 30d supply, fill #0

## 2020-06-02 MED ORDER — RIVAROXABAN 15 MG PO TABS: 15.0000 mg | ORAL_TABLET | Freq: Two times a day (BID) | ORAL | Status: DC

## 2020-06-02 MED ORDER — ACCU-CHEK SOFTCLIX LANCETS MISC
5 refills | Status: AC
  Filled 2020-06-02: qty 100, 25d supply, fill #0

## 2020-06-02 MED ORDER — RIVAROXABAN 20 MG PO TABS
20.0000 mg | ORAL_TABLET | Freq: Every day | ORAL | 0 refills | Status: DC
Start: 2020-06-16 — End: 2020-06-12
  Filled 2020-06-02: qty 30, 30d supply, fill #0

## 2020-06-02 MED ORDER — RIVAROXABAN 20 MG PO TABS: 20.0000 mg | ORAL_TABLET | Freq: Every day | ORAL | Status: DC

## 2020-06-02 MED ORDER — INSULIN GLARGINE 100 UNIT/ML SOLOSTAR PEN
15.0000 [IU] | PEN_INJECTOR | Freq: Every day | SUBCUTANEOUS | 0 refills | Status: AC
  Filled 2020-06-02: qty 6, 30d supply, fill #0

## 2020-06-02 MED ORDER — INSULIN PEN NEEDLE 32G X 4 MM MISC
0 refills | Status: AC
  Filled 2020-06-02: qty 100, 25d supply, fill #0

## 2020-06-02 MED ORDER — ACCU-CHEK GUIDE VI STRP
ORAL_STRIP | 12 refills | Status: AC
  Filled 2020-06-02: qty 100, 25d supply, fill #0

## 2020-06-02 MED ORDER — SULFAMETHOXAZOLE-TRIMETHOPRIM 800-160 MG PO TABS
1.0000 | ORAL_TABLET | ORAL | 0 refills | Status: AC
Start: 2020-06-03 — End: ?
  Filled 2020-06-02: qty 20, 47d supply, fill #0

## 2020-06-02 MED ORDER — ACCU-CHEK GUIDE W/DEVICE KIT
PACK | 0 refills | Status: AC
  Filled 2020-06-02: qty 1, 1d supply, fill #0

## 2020-06-02 MED ORDER — SODIUM ZIRCONIUM CYCLOSILICATE 10 G PO PACK
10.0000 g | PACK | Freq: Once | ORAL | Status: AC
  Administered 2020-06-02: 10 g via ORAL
  Filled 2020-06-02: qty 1

## 2020-06-02 MED ORDER — BLOOD GLUCOSE MONITOR KIT
PACK | 1 refills | Status: DC
  Filled 2020-06-02: qty 1, fill #0

## 2020-06-02 MED ORDER — FUROSEMIDE 40 MG PO TABS
40.0000 mg | ORAL_TABLET | Freq: Once | ORAL | Status: AC
  Administered 2020-06-02: 40 mg via ORAL
  Filled 2020-06-02: qty 1

## 2020-06-02 MED ORDER — METOPROLOL TARTRATE 50 MG PO TABS
50.0000 mg | ORAL_TABLET | Freq: Two times a day (BID) | ORAL | 0 refills | Status: AC
  Filled 2020-06-02: qty 60, 30d supply, fill #0

## 2020-06-02 NOTE — Progress Notes (Signed)
Inpatient Diabetes Program Recommendations  AACE/ADA: New Consensus Statement on Inpatient Glycemic Control (2015)  Target Ranges:  Prepandial:   less than 140 mg/dL      Peak postprandial:   less than 180 mg/dL (1-2 hours)      Critically ill patients:  140 - 180 mg/dL   Lab Results  Component Value Date   GLUCAP 192 (H) 06/02/2020   HGBA1C 6.3 (H) 05/12/2020    Review of Glycemic Control Results for Tyrone Roman, Tyrone Roman (MRN [id number]) as of 06/02/2020 15:38  Ref. Range 06/01/2020 11:45 06/01/2020 17:00 06/01/2020 20:51 06/02/2020 06:24 06/02/2020 13:26  Glucose-Capillary Latest Ref Range: 70 - 99 mg/dL 226 (H) 137 (H) 247 (H) 161 (H) 192 (H)   Diabetes history: DM 2-Steroid induced Outpatient Diabetes medications:  None Current orders for Inpatient glycemic control:  Novolog resistant tid with meals and HS Novolog 3 units tid with meals  Lantus 35 units daily Prednisone 30 mg bid  Inpatient Diabetes Program Recommendations:    Referral received.  Patient being d/c'd home today.  Educated patient on insulin pen use at home.  Reviewed contents of insulin flexpen starter kit.  Reviewed all steps if insulin pen including attachment of needle, 2-unit air shot, dialing up dose, giving injection, removing needle, disposal of sharps, storage of unused insulin, disposal of insulin etc.  Patient able to provide successful return demonstration.  Also reviewed troubleshooting with insulin pen.  MD to give patient Rxs for insulin pens and insulin pen needles.Sent video to patient's daughter and patient as well reviewing the insulin pen.  Discussed that blood sugars are likely high due to steroids.  Discussed with patient the importance of monitoring and goal blood sugars of 80-130 mg/dL. Explained that Lantus is to be given once a day and that Novolog is ordered if CBG>250 mg/dL.  Also discussed that once steroids reduced, may not need insulin based on recent A1C.  Encouraged patient to check blood sugars  tid with meals and HS. Patient verbalized understanding.   Thanks,  Adah Perl, RN, BC-ADM Inpatient Diabetes Coordinator Pager [phone] (8a-5p)

## 2020-06-02 NOTE — Progress Notes (Addendum)
[place] for heparin >> enoxaparin Indication: PE/DVT 05/14/20, 05/25/20  Allergies  Allergen Reactions  . Cephalexin Shortness Of Breath, Rash and Other (See Comments)    Tolerated Augmentin 2015 and 2017 (12-2017). Tolerated nafcillin 12/2017 PATIENT HAS HAD A PCN REACTION WITH IMMEDIATE RASH, FACIAL/TONGUE/THROAT SWELLING, SOB, OR LIGHTHEADEDNESS WITH HYPOTENSION:  #  #  YES  #  #  Has patient had a PCN reaction causing severe rash involving mucus membranes or skin necrosis: No Has patient had a PCN reaction that required hospitalization: No Has patient had a PCN reaction occurring within the last 10 years: No If all of the above answers are "NO", then may proceed  . Clarithromycin Shortness Of Breath and Rash  . Certolizumab Pegol Hives  . Tocilizumab Hives  . Doxycycline Rash  . Hydroxyzine Rash  . Lidoderm Other (See Comments)    "made me act weird"  . Pyrithione Zinc Rash    Patient Measurements: Height: 6\' 2"  (188 cm) Weight: 88 kg (194 lb 0.1 oz) (Per 4/13 note) IBW/kg (Calculated) : 82.2    Vital Signs: Temp: 97.6 F (36.4 C) (04/28 0625) Temp Source: Oral (04/28 0625) BP: 118/86 (04/28 0625) Pulse Rate: 65 (04/28 0625)  Labs: Recent Labs    05/31/20 0227 06/01/20 0250 06/01/20 1157  HGB 13.1 12.4*  --   HCT 40.4 38.2*  --   PLT 174 168  --   CREATININE 1.04  --  0.92    Estimated Creatinine Clearance: 80.7 mL/min (by C-G formula based on SCr of 0.92 mg/dL).  Assessment: 68 YOM on apixaban PTA for PE 05/14/20 who received therapeutic heparin and enoxaparin 4/6- 4/13. Patient was discharged from hospital on apixaban. On readmission 4/20, CT scan found a new PE and 4/21 dopplers found age indeterminate LLE DVT. Apixaban now held for likely failure. Patient states compliance with apixaban.   Has been on enoxaparin 90 gm BID - plan to transition to Xarelto. Discussed with MD, given has been on heparin/enoxaparin for since  4/20 - will shorten loading period with Xarelto to 2 weeks instead of 3 weeks. CBC stable on last check - no s/sx of bleeding. Renal function stable.  Goal of Therapy:  Monitor platelets by anticoagulation protocol: Yes   Plan:  Stop enoxaparin Start Xarelto 15 mg BID for 14 days tonight then 20 mg daily thereafter  Monitor for signs and symptoms of bleeding  Antonietta Jewel, PharmD, [facility] Pharmacist  Phone: [phone] 06/02/2020 8:27 AM  Please check AMION for all [facility] phone numbers After 10:00 PM, call [facility] [phone]

## 2020-06-02 NOTE — Discharge Summary (Signed)
Tyrone Roman NIO:[id number] DOB: 07-14-1945 DOA: 05/25/2020  PCP: [name], MD  Admit date: 05/25/2020  Discharge date: 06/02/2020  Admitted From: Home   Disposition:  Home - refused SNF   Recommendations for Outpatient Follow-up:   Follow up with PCP in 1-2 weeks  PCP Please obtain BMP/CBC, 2 view CXR in 1week,  (see Discharge instructions)   PCP Please follow up on the following pending results: monitor CBC, CMP, magnesium, CBGs, A1c, CXR in 7-10 days,    Home Health: PT, RN, Aide, SW Equipment/Devices: has equipment already, Trapeze bar added, see below  Consultations: ID, PCCM Discharge Condition: Stable    CODE STATUS: Full    Diet Recommendation: Heart Healthy Low Carb  Diet Order            Diet - low sodium heart healthy           Diet Carb Modified Fluid consistency: Thin; Room service appropriate? Yes  Diet effective now                  Chief Complaint  Patient presents with  . Code Sepsis     Brief history of present illness from the day of admission and additional interim summary    Patient is a 75 y.o. male with PMHx of RA, CAD s/p PCI, HTN, HLD, COPD, BPH-who had COVID-19 infection in January 2021-since then has had numerous hospitalizations for shortness of breath-FUO-most recently discharged from 4/4-4/13-after extensive work-up including ID evaluation-found to have PE/DVT-maintained on prednisone 30 mg p.o. twice daily (fever responsive to steroids per prior notes)-presenting back to the hospital with 4-5-day history of recurrent fever (as high as 103 F) and worsening shortness of breath.  See below for further details.  Summary of prior Hospitalizations: 1/24 - 1/26>> admit for COVID PNA- received IV steroids/remdesivir-No baricitinib/Actemra due to underling  immunosuppressants. Seen with Coag-neg staph bacteremia, ID seen with no reccs for ABT's 2/10 - 2/11>> Readmitted recurrent SOB/hypoxia with substernal CP. Concern that steroid taper was completed to quickly, patient was placed on 10 day taper. 2/18 - 2/25>> Readmitted for recurrent SOB/hypoxia. COVID PCR remained positive. Negative for PE. Received zosyn/vanc with switch to PO Augmentin at d/c. Treated with short steroid taper  2/27 - 3/2>> Readmitted and treated for SOB with fever and AMS. ? Aspiration PNA and received empiric Unasyn and switched to Augmentin on d/c 4/4 - 4/13>> Readmitted from [facility] forhypoxic respiratory failure to be due to acute on chronic congestive heart failure. COVID PCR positive.He received broad spectrum ABT which were eventually d/c due to no obvious cause.  Had FUO of unknown etiology-with concern for possible vasculities that temporary resolves with higher doses of IV steroids. D/C'd on 33m BID prednisone and Eliquis for new acute PE seen on CTA chest  COVID-19 vaccinated status: Vaccinated including booster ( COVID antibodies undetectable x 2 in spite of vaccine/natural infection)  Significant Events: [facility]significant infiltrates on CT chest-worsening clot burden.  Requiring  anywhere from 10-12 L of oxygen. 4/21>> MAB infusion.  Significant studie 4/21>>> Serum SARS-COV-2 antibody-IgG: Negative 4/21>>Serum Fungitell:pending 4/20>> CTA chest: PE extending from right pulmonary artery into lobar/segmental branches, significant bilateral pulmonary infiltrates. 4/21>> B/L lower ext Doppler: Left popliteal- posterior tibial-peroneal vein DVT(Previously B/L DVT on 4/6) 4/21>> Echo: EF 99-24%, RV systolic function normal. 4/13>> MPO/ANCA: Negative 4/12>> SPEP: No M spike 4/10>> RA factor: 12.9 (WNL) 4/10>> ACE: 34 (WNL) 4/9>> GOLD QuantiFERON: Negative 4/9>> CMV IgG/IgM: Negative 4/9>> EBV IgM:  Negative 4/9>> ANA: Negative 4/9>> Serum cryptococcal/histoplasma/blastomycosis antigen: Negative 4/9>> Serum SARS-COV-2 antibody-IgG: Negative 4/6>> bilateral lower extremity Doppler: Bilateral lower extremity DVT 2/27>> Echo: EF 60-65%  COVID-19 medications: Remdesivir: 4/21>> IV Solu-Medrol: 4/21>> Bebtelovimab: 4/21 x 1  Antibiotics: Vancomycin: 4/20 x 1 Flagyl: 4/20>> 4/21 Cefepime: 4/20 x 1  Microbiology data: 4/21>> sputum AFB smear/culture, PCP smear and culture sensitivity: Ordered 4/20 >>blood culture: No growth 4/20>> SARS 2 coronavirus PCR: Positive with cycle threshold of 24.2 4/4>> SARS 2 coronavirus PCR: Positive with cycle threshold of 29.6 2/18>> SARS 2 coronavirus PCR: Positive with cycle threshold of 37.5                                                                 Hospital Course   Acute on Chronic hypoxic respiratory failure due to recurrent Covid infections, breakthrough PE on Eliquis :  Continues to test positive for COVID-19 since January-however last 2 tests in April-with significant cycle threshold-that continues to indicate the patient is infectious with Cycle counts < 30.  This is likely due to his immunosuppression from rheumatoid medications that are preventing him to clear his infection.  ID and pulmonary were consulted and he was treated with a course of remdesivir, tapering IV steroids along with 1 dose of monoclonal antibody Bebtelovimab (after significant discussion with ID/PCCM ), clinically much better down from 12 lits o2 to 2lits which is his baseline.  Note he rapidly improved after recently sitting up in chair and using flutter valve for pulmonary toiletry.  He has been requested to take flutter valve home.  He is overall quite deconditioned.  Qualified for SNF but he refused SNF placement again despite counseling by me and his daughter wants to go home.  Breakthrough PE on Eliquis - Anticoagulation switched from Eliquis >> Xaralto, he  refused Lovenox.    FUO:  Likely due to combination of COVID-19 infection, new blood clots and possible adrenal insufficiency.  Seen by ID.  Placed on prophylactic Bactrim as he is chronically on prednisone, sputum grew Candida glabrata but no treatment for that needed per ID and PCCM.  Fevers have resolved.  Procalcitonin was stable..  Chronic diastolic heart failure: Euvolemic on exam- Ef 60%.  As needed Lasix was used.  Hyponatremia: Euvolemic on exam-could have SIADH pathophysiology-improved-PCP to recheck in 7-10 days.  Hyperkalemia.  Likely due to Bactrim.  Bactrim p.o. dropped from once twice daily to once every 2 days per ID, hyperkalemia treatment provided, will get Kayexalate, low-dose Lasix and 1 dose of Lokelma.  Follow-up with PCP in 7 to 10 days.    HTN: BP is stable on B blocker.  HLD: Continue statin  History of rheumatoid arthritis: Has been on prednisone for the past several months .  Also suspicion  of relative adrenal insufficiency.  Relative adrenal insufficiency.  Has been on steroids for a long time, continue twice daily prednisone.    Steroid-induced hyperglycemia, A1c stable, was on a high dose of Lantus and sliding scale, since prednisone has been tapered, low-dose Lantus along with low-dose before every meal sliding scale if needed.  Testing supplies provided.  Diabetic and insulin education prior to discharge, requested to do Accu-Cheks q. [facility] S and show the results to PCP next visit.  COPD: Continue inhaler regimen  GERD: Continue PPI  BPH: Continue Flomax  Debility/deconditioning: Due to acute on chronic illness-await PT/OT eval.  Previously had refused SNF.  He is refusing SNF again.  Daughter has been informed.    Discharge diagnosis     Principal Problem:   COVID-19 Active Problems:   Rheumatoid arthritis ([place])   Acute hypoxemic respiratory failure (HCC)   Pulmonary embolus (HCC)   SIRS (systemic inflammatory response syndrome) (HCC)    Elevated troponin    Discharge instructions    Discharge Instructions    Diet - low sodium heart healthy   Complete by: As directed    Discharge instructions   Complete by: As directed    Follow with Primary MD [name], MD in 7 days   Get CBC, CMP, 2 view Chest X ray -  checked next visit within 1 week by Primary MD   Activity: As tolerated with Full fall precautions use walker/cane & assistance as needed  Disposition Home    Diet: Heart Healthy Low Carb   Accuchecks 4 times/day, Once in AM empty stomach and then before each meal. Log in all results and show them to your Prim.MD in 3 days. If any glucose reading is under 80 or above 300 call your Prim MD immidiately. Follow Low glucose instructions for glucose under 80 as instructed. Marland Kitchen  Special Instructions: If you have smoked or chewed Tobacco  in the last 2 yrs please stop smoking, stop any regular Alcohol  and or any Recreational drug use.  On your next visit with your primary care physician please Get Medicines reviewed and adjusted.  Please request your Prim.MD to go over all Hospital Tests and Procedure/Radiological results at the follow up, please get all Hospital records sent to your Prim MD by signing hospital release before you go home.  If you experience worsening of your admission symptoms, develop shortness of breath, life threatening emergency, suicidal or homicidal thoughts you must seek medical attention immediately by calling 911 or calling your MD immediately  if symptoms less severe.  You Must read complete instructions/literature along with all the possible adverse reactions/side effects for all the Medicines you take and that have been prescribed to you. Take any new Medicines after you have completely understood and accpet all the possible adverse reactions/side effects.   Increase activity slowly   Complete by: As directed    MyChart COVID-19 home monitoring program   Complete by: Jun 02, 2020     Is the patient willing to use the [place] for home monitoring?: Yes   Temperature monitoring   Complete by: Jun 02, 2020    After how many days would you like to receive a notification of this patient's flowsheet entries?: 1      Discharge Medications   Allergies as of 06/02/2020      Reactions   Cephalexin Shortness Of Breath, Rash, Other (See Comments)   Tolerated Augmentin 2015 and 2017 (12-2017). Tolerated nafcillin 12/2017 PATIENT HAS  HAD A PCN REACTION WITH IMMEDIATE RASH, FACIAL/TONGUE/THROAT SWELLING, SOB, OR LIGHTHEADEDNESS WITH HYPOTENSION:  #  #  YES  #  #  Has patient had a PCN reaction causing severe rash involving mucus membranes or skin necrosis: No Has patient had a PCN reaction that required hospitalization: No Has patient had a PCN reaction occurring within the last 10 years: No If all of the above answers are "NO", then may proceed   Clarithromycin Shortness Of Breath, Rash   Certolizumab Pegol Hives   Tocilizumab Hives   Doxycycline Rash   Hydroxyzine Rash   Lidoderm Other (See Comments)   "made me act weird"   Pyrithione Zinc Rash      Medication List    STOP taking these medications   Eliquis DVT/PE Starter Pack Generic drug: Apixaban Starter Pack (26m and 52m     TAKE these medications   acetaminophen 325 MG tablet Commonly known as: TYLENOL Take 650 mg by mouth every 6 (six) hours as needed for headache, mild pain or fever.   albuterol 108 (90 Base) MCG/ACT inhaler Commonly known as: Ventolin HFA Inhale 2 puffs into the lungs every 4 (four) hours as needed for wheezing or shortness of breath.   blood glucose meter kit and supplies Kit Dispense based on patient and insurance preference. Use up to four times daily as directed. (FOR ICD-9 250.00, 250.01). For QAC - HS accuchecks.   budesonide-formoterol 160-4.5 MCG/ACT inhaler Commonly known as: Symbicort Inhale 2 puffs into the lungs in the morning and at bedtime.   Icy Hot 5 %  Ptch Generic drug: Menthol Apply 1 patch topically 2 (two) times daily as needed (back pain). Apply to lower back.   insulin glargine 100 UNIT/ML injection Commonly known as: Lantus Inject 0.15 mLs (15 Units total) into the skin at bedtime. Dispense insulin pen if approved, if not dispense as needed syringes and needles for 1 month supply. Can switch to Levemir. Diagnosis E 11.65.   insulin lispro 100 UNIT/ML injection Commonly known as: HUMALOG Before each meal 3 times a day, 140-199 - 2 units, 200-250 - 4 units, 251-299 - 6 units,  300-349 - 8 units,  350 or above 10 units. Insulin PEN if approved, provide syringes and needles if needed.   Insulin Syringe-Needle U-100 25G X 1" 1 ML Misc For 4 times a day insulin SQ, 1 month supply. Diagnosis E11.65   lactose free nutrition Liqd Take 237 mLs by mouth 3 (three) times daily with meals.   melatonin 5 MG Tabs Take 5 mg by mouth at bedtime as needed (sleep).   metoprolol tartrate 50 MG tablet Commonly known as: LOPRESSOR Take 1 tablet (50 mg total) by mouth 2 (two) times daily. What changed:   medication strength  how much to take   omeprazole 40 MG capsule Commonly known as: PRILOSEC Take 1 capsule (40 mg total) by mouth daily.   predniSONE 10 MG tablet Commonly known as: DELTASONE Take 3 tablets (30 mg total) by mouth every 12 (twelve) hours.   Rivaroxaban 15 MG Tabs tablet Commonly known as: XARELTO Take 1 tablet (15 mg total) by mouth 2 (two) times daily with a meal for 13 days.   rivaroxaban 20 MG Tabs tablet Commonly known as: XARELTO Take 1 tablet (20 mg total) by mouth daily with supper. Start taking on: Jun 16, 2020   simvastatin 20 MG tablet Commonly known as: ZOCOR Take 1 tablet (20 mg total) by mouth daily.   Spiriva Respimat 2.5  MCG/ACT Aers Generic drug: Tiotropium Bromide Monohydrate Inhale 2 puffs into the lungs daily.   sulfamethoxazole-trimethoprim 800-160 MG tablet Commonly known as: BACTRIM  DS Take 1 tablet by mouth 3 (three) times a week. Start taking on: June 03, 2020 What changed: when to take this   tamsulosin 0.4 MG Caps capsule Commonly known as: FLOMAX Take 1 capsule (0.4 mg total) by mouth daily.            Durable Medical Equipment  (From admission, onward)         Start     Ordered   06/01/20 1416  For home use only DME Other see comment  Once       Comments: Slide board 30 in  Question:  Length of Need  Answer:  12 Months   06/01/20 1415   05/31/20 1702  For home use only DME Walker rolling  Once       Question Answer Comment  Walker: With [place] Wheels   Patient needs a walker to treat with the following condition Weakness      05/31/20 1701   05/31/20 1702  For home use only DME 3 n 1  Once        05/31/20 1701   05/31/20 0941  For home use only DME Trapeze  Once       Question:  Length of Need  Answer:  6 Months   05/31/20 0940           Follow-up Information    [facility], [facility] Follow up.   Specialty: Home Health Services Why: [facility] services to resume (RN/PT/aide/CSW) Contact information: [place] [address] [phone]        [facility], [facility] Follow up.   Why: trapeze bar, Rolling walker, 3n1, slide board arranged- to be delivered to the home Contact information: [place] [address] [phone]        [name], MD. Schedule an appointment as soon as possible for a visit in 1 week(s).   Specialty: Family Medicine Contact information: [place] [address] [phone]        Werner Lean, MD. Schedule an appointment as soon as possible for a visit in 2 week(s).   Specialty: Cardiology Contact information: [place] [address] [phone]               Major procedures and Radiology Reports - PLEASE review detailed and final reports thoroughly  -       CT Angio Chest PE W and/or  Wo Contrast  Result Date: 05/25/2020 CLINICAL DATA:  75 year old male with shortness of breath and chest pain. Concern for pulmonary embolism. EXAM: CT ANGIOGRAPHY CHEST WITH CONTRAST TECHNIQUE: Multidetector CT imaging of the chest was performed using the standard protocol during bolus administration of intravenous contrast. Multiplanar CT image reconstructions and MIPs were obtained to evaluate the vascular anatomy. CONTRAST:  55m OMNIPAQUE IOHEXOL 350 MG/ML SOLN COMPARISON:  Chest CT dated 05/14/2020. FINDINGS: Cardiovascular: Mild cardiomegaly. No pericardial effusion. There is coronary vascular calcification. Mild atherosclerotic calcification of the thoracic aorta. Linear nonocclusive thrombus noted extending from the central right pulmonary artery into the lobar and segmental branches of the right lower lobe and right upper lobe. This is new since the prior CT. No CT evidence of right heart straining. Mediastinum/Nodes: Top-normal right hilar lymph nodes. The esophagus and the thyroid gland are grossly unremarkable. No  mediastinal fluid collection. Lungs/Pleura: Bilateral patchy and streaky densities similar to prior CT. Calcified pleural plaque noted in the left upper lobe anteriorly. No pleural effusion pneumothorax. The central airways are patent. Upper Abdomen: No acute abnormality. Musculoskeletal: No chest wall abnormality. No acute or significant osseous findings. Review of the MIP images confirms the above findings. IMPRESSION: 1. Pulmonary artery embolus extending from the central right pulmonary artery into the lobar and segmental branches of the right lower lobe and right upper lobe, new since the prior CT. No CT evidence of right heart straining. 2. Bilateral patchy and streaky densities similar to prior CT. 3. Aortic Atherosclerosis (ICD10-I70.0). These results were called by telephone at the time of interpretation on 05/25/2020 at 6:39 pm to provider DAVID YAO , who verbally acknowledged these  results. Electronically Signed   By: Anner Crete M.D.   On: 05/25/2020 18:43   CT ANGIO CHEST PE W OR WO CONTRAST  Result Date: 05/14/2020 CLINICAL DATA:  High probability sepsis due to pneumonia EXAM: CT ANGIOGRAPHY CHEST WITH CONTRAST TECHNIQUE: Multidetector CT imaging of the chest was performed using the standard protocol during bolus administration of intravenous contrast. Multiplanar CT image reconstructions and MIPs were obtained to evaluate the vascular anatomy. CONTRAST:  42m OMNIPAQUE IOHEXOL 350 MG/ML SOLN COMPARISON:  04/02/2020 FINDINGS: Cardiovascular: Satisfactory opacification by motion of pulmonary artifact. Arteries although limited there is a convincing branching pulmonary artery filling defect affecting segmental vessels in the basal right lower lobe as marked on series 6. Additional peripheral emboli could be present and obscured by the degree of motion artifact. No central embolism is seen. No evidence of right heart strain. Heart size is normal. No pericardial effusion. Atherosclerosis of the coronaries. Mediastinum/Nodes: Negative for adenopathy or mass. Lungs/Pleura: Chronic lung disease with reticulation. Bilateral pleural plaque appearance without calcification. There is increased ground-glass density scattered in the bilateral lungs, greatest in the right upper lobe. No interlobular septal thickening or pleural fluid to implicate failure. Mild centrilobular emphysema. Upper Abdomen: Negative Musculoskeletal: No unexpected finding Review of the MIP images confirms the above findings. Critical Value/emergent results were called by telephone at the time of interpretation on 05/14/2020 at 8:35 am to provider Elgergawy, who verbally acknowledged these results. IMPRESSION: 1. Positive for segmental pulmonary embolism in the right lower lobe. Additional pulmonary emboli could be obscured by the degree of motion artifact. 2. Background of chronic opacities related to recent COVID  infection. There is also new ground-glass opacity especially in the right upper lobe suggesting a superimposed acute pneumonia. 3. Aortic Atherosclerosis (ICD10-I70.0).  Coronary atherosclerosis. Electronically Signed   By: JMonte FantasiaM.D.   On: 05/14/2020 08:35   MR BRAIN W WO CONTRAST  Result Date: 05/16/2020 CLINICAL DATA:  Delirium; fever with unknown origin, altered mental status, evaluate for encephalitis. EXAM: MRI HEAD WITHOUT AND WITH CONTRAST TECHNIQUE: Multiplanar, multiecho pulse sequences of the brain and surrounding structures were obtained without and with intravenous contrast. CONTRAST:  849mGADAVIST GADOBUTROL 1 MMOL/ML IV SOLN COMPARISON:  Prior head CT examinations 04/02/2020 and earlier. Brain MRI 12/13/2017. FINDINGS: Brain: Mild cerebral and cerebellar atrophy. AP punctate focus of restricted diffusion is questioned within the right parietal lobe (versus artifact) (series 3, image 33). Additionally, there is apparent subtle restricted diffusion along the midline callosal splenium, slightly eccentric to the left. A few small foci of T2/FLAIR hyperintensity within the bilateral cerebral white matter and right thalamus are nonspecific, but likely reflect minimal chronic small vessel ischemic disease. Redemonstrated left retrocerebellar arachnoid  cyst. There is no acute infarct. No evidence of intracranial mass. No chronic intracranial blood products. No extra-axial fluid collection. No midline shift. No abnormal intracranial enhancement. Vascular: Expected proximal arterial flow voids. Skull and upper cervical spine: No focal marrow lesion. Incompletely assessed cervical spondylosis. Suspected C4-C5 vertebral body ankylosis. Sinuses/Orbits: Visualized orbits show no acute finding. Bilateral lens replacements. Mild paranasal sinus mucosal thickening, most notably within the bilateral ethmoid air cells. Small left maxillary sinus mucous retention cyst. Other: Right mastoid effusion.  IMPRESSION: Punctate acute infarct versus artifact within the right parietal lobe. Additionally, there is apparent subtle restricted diffusion within the midline callosal splenium, slightly eccentric to the left. This may reflect a small focus of acute ischemia or artifact. Alternatively, this may reflect a cytotoxic lesion of the corpus callosum (which has a broad range of potential etiologies and clinical associations), although the appearance would be atypical for this entity. Background mild parenchymal atrophy and chronic small vessel ischemic disease. Mild paranasal sinus disease as described. Right mastoid effusion. Electronically Signed   By: Kellie Simmering DO   On: 05/16/2020 18:51   MR CERVICAL SPINE W WO CONTRAST  Result Date: 05/17/2020 CLINICAL DATA:  Initial screening for possible epidural abscess. EXAM: MRI CERVICAL SPINE WITHOUT AND WITH CONTRAST TECHNIQUE: Multiplanar and multiecho pulse sequences of the cervical spine, to include the craniocervical junction and cervicothoracic junction, were obtained without and with intravenous contrast. CONTRAST:  41m GADAVIST GADOBUTROL 1 MMOL/ML IV SOLN COMPARISON:  Prior MRI from 03/24/2010. FINDINGS: Alignment: Reversal of the normal cervical lordosis with apex at C3-4. Trace anterolisthesis of C3 on C4 and C7 on T1, chronic and facet mediated. Vertebrae: Vertebral body height maintained without acute or chronic fracture. C4 and C5 vertebral bodies are largely ankylosed, likely degenerative. Bone marrow signal intensity within normal limits. No discrete or worrisome osseous lesions. No abnormal marrow edema or enhancement. No findings to suggest osteomyelitis discitis or septic arthritis. Cord: Normal signal and morphology. No epidural abscess or other collection. Posterior Fossa, vertebral arteries, paraspinal tissues: Probable benign retro cerebellar arachnoid cyst noted. Visualized brain and posterior fossa otherwise unremarkable. Craniocervical junction  within normal limits. Paraspinous and prevertebral soft tissues normal. Normal flow voids seen within the vertebral arteries bilaterally. Disc levels: C2-C3: Negative interspace. Right worse than left facet hypertrophy. No spinal stenosis. Mild right C3 foraminal narrowing. Left neural foramina remains patent. C3-C4: Broad-based posterior disc osteophyte flattens and effaces the ventral thecal sac. Mild flattening of the ventral cord without cord signal changes. Mild spinal stenosis. Superimposed bilateral facet hypertrophy with left greater than right uncovertebral spurring. Severe left with moderate right C4 foraminal stenosis. C4-C5: C4 and C5 vertebral bodies are largely ankylosed. Broad posterior endplate osseous ridging flattens and effaces the ventral thecal sac. Mild spinal stenosis without cord impingement. Moderate right worse than left C5 foraminal stenosis. C5-C6: Degenerative intervertebral disc space narrowing with diffuse disc osteophyte complex. Mild flattening of the ventral thecal sac without significant spinal stenosis. Moderate left worse than right C6 foraminal narrowing. C6-C7: Degenerative intervertebral disc space narrowing with diffuse disc osteophyte complex. No significant spinal stenosis. Severe left with moderate right C7 foraminal stenosis. C7-T1: Trace anterolisthesis. No significant disc bulge. Moderate bilateral facet and ligament flavum hypertrophy. No significant spinal stenosis. Foramina remain patent. IMPRESSION: 1. No evidence for osteomyelitis discitis or septic arthritis within the cervical spine. No epidural abscess or other infection. 2. Multilevel cervical spondylosis with resultant mild spinal stenosis at C3-4 and C4-5. 3. Multifactorial degenerative changes with resultant  multilevel foraminal narrowing as above. Notable findings include severe left with moderate right C4 foraminal stenosis, moderate bilateral C5 and C6 foraminal narrowing, with severe left and moderate  right C7 foraminal stenosis. Electronically Signed   By: Jeannine Boga M.D.   On: 05/17/2020 21:40   MR THORACIC SPINE W WO CONTRAST  Result Date: 05/17/2020 CLINICAL DATA:  Initial screening for possible infection, epidural abscess. EXAM: MRI THORACIC WITHOUT AND WITH CONTRAST TECHNIQUE: Multiplanar and multiecho pulse sequences of the thoracic spine were obtained without and with intravenous contrast. CONTRAST:  10m GADAVIST GADOBUTROL 1 MMOL/ML IV SOLN COMPARISON:  None available. FINDINGS: Alignment: Straightening of the normal thoracic kyphosis. No listhesis. Vertebrae: Vertebral body height maintained without acute or chronic fracture. Bone marrow signal intensity within normal limits. Few scattered benign hemangiomata noted. No worrisome osseous lesions. No abnormal marrow edema or enhancement. No findings to suggest osteomyelitis discitis or septic arthritis. Cord: Normal signal and morphology. No epidural abscess or other collection. No abnormal enhancement. Paraspinal and other soft tissues: Paraspinous soft tissues demonstrate no acute finding. Small layering bilateral pleural effusions noted, left slightly larger than right. T2 hyperintense benign appearing cyst partially visualized extending from the right kidney. Visualized visceral structures otherwise grossly unremarkable. Disc levels: T1-2:  Unremarkable. T2-3: Tiny right paracentral disc extrusion with superior migration. Mild right-sided facet hypertrophy. No spinal stenosis. Foramina remain patent. T3-4: Minimal disc bulge. No spinal stenosis. Foramina remain patent. T4-5: Normal interspace. Mild right-sided facet hypertrophy. No stenosis. T5-6:  Normal interspace.  Mild facet hypertrophy.  No stenosis. T6-7:  Unremarkable. T7-8: Mild degenerative intervertebral disc space narrowing with disc bulge. No spinal stenosis. Foramina remain patent. T8-9: Mild disc bulge. No spinal stenosis. Foramina remain patent. T9-10: Mild disc bulge with  posterior element hypertrophy. No significant spinal stenosis. Mild bilateral foraminal narrowing. T10-11: Diffuse disc bulge, asymmetric to the right. Moderate facet and ligament flavum hypertrophy. Resultant mild to moderate spinal stenosis with mild cord flattening. No cord signal changes. Moderate bilateral foraminal narrowing. T11-12: Negative interspace. Bilateral facet hypertrophy. No significant spinal stenosis. Mild bilateral foraminal narrowing. T12-L1: Disc desiccation with small endplate Schmorl's node deformities. No stenosis. IMPRESSION: 1. No MRI evidence for osteomyelitis discitis or septic arthritis within the thoracic spine. No epidural abscess or other infection. 2. Disc bulging with facet hypertrophy at T10-11 with resultant mild to moderate spinal stenosis and moderate bilateral foraminal narrowing. 3. Additional mild spondylosis elsewhere within the thoracic spine without significant stenosis. 4. Small layering bilateral pleural effusions, left slightly larger than right. Electronically Signed   By: BJeannine BogaM.D.   On: 05/17/2020 21:52   MR Lumbar Spine W Wo Contrast  Result Date: 05/13/2020 CLINICAL DATA:  Fever, lumbar spine tenderness EXAM: MRI LUMBAR SPINE WITHOUT AND WITH CONTRAST TECHNIQUE: Multiplanar and multiecho pulse sequences of the lumbar spine were obtained without and with intravenous contrast. CONTRAST:  852mGADAVIST GADOBUTROL 1 MMOL/ML IV SOLN COMPARISON:  04/03/2018 FINDINGS: Segmentation:  Standard. Alignment:  Stable. Vertebrae: There is now fusion across the L1-L2 disc space. There is no new marrow edema or enhancement. Multilevel degenerative plate irregularity and Schmorl's nodes. Conus medullaris and cauda equina: Conus extends to the L1-L2 level. Conus and cauda equina appear normal. No intrathecal enhancement. Paraspinal and other soft tissues: Mild lower lumbar paraspinal edema. No evidence of collection. Disc levels: L1-L2: Prior right laminectomy.  Stable retrolisthesis with bridging endplate osteophytes effacing the ventral thecal sac. Similar mild to moderate foraminal stenosis. L2-L3: Disc bulge. Facet arthropathy with ligamentum  flavum infolding. Slightly increased mild canal stenosis. Similar mild foraminal stenosis. L3-L4: Disc bulge. Facet arthropathy with ligamentum flavum infolding. Similar moderate canal stenosis with effacement of subarticular recesses. Similar mild to moderate left greater than right foraminal stenosis. L4-L5: Disc bulge with superimposed left subarticular/foraminal protrusion. Facet arthropathy with ligamentum flavum infolding. Similar mild canal stenosis. Similar partial effacement of the left subarticular recess. Similar mild foraminal stenosis. L5-S1: Disc bulge with endplate osteophytic ridging. Facet arthropathy. No canal stenosis. Similar mild foraminal stenosis. IMPRESSION: No evidence of discitis/osteomyelitis. Multilevel degenerative changes as detailed above without substantial change since the prior examination. Electronically Signed   By: Macy Mis M.D.   On: 05/13/2020 14:25   CT ABDOMEN PELVIS W CONTRAST  Result Date: 05/15/2020 CLINICAL DATA:  Sepsis.  Fever of unknown origin. EXAM: CT ABDOMEN AND PELVIS WITH CONTRAST TECHNIQUE: Multidetector CT imaging of the abdomen and pelvis was performed using the standard protocol following bolus administration of intravenous contrast. CONTRAST:  148m OMNIPAQUE IOHEXOL 300 MG/ML  SOLN COMPARISON:  04/02/2020.  Chest CTA obtained earlier today. FINDINGS: Lower chest: Progressive pulmonary embolism on the right, with emboli currently demonstrated in the distal right main pulmonary artery and significantly more clot in the intersegmental and proximal right lower lobe pulmonary arteries. Bilateral lung densities and atelectasis with improvement since this morning. Hepatobiliary: Diffuse low density of the liver relative to the spleen. Normal appearing gallbladder.  Pancreas: Unremarkable. No pancreatic ductal dilatation or surrounding inflammatory changes. Spleen: Normal in size without focal abnormality. Adrenals/Urinary Tract: Normal appearing adrenal glands. Bilateral renal cysts. Multiple small bilateral renal calculi. No bladder or ureteral calculi are seen. Excreted contrast in the urinary bladder from the CTA earlier today. Stomach/Bowel: Multiple colonic diverticula without evidence of diverticulitis. Unremarkable stomach, small bowel and appendix. Vascular/Lymphatic: Atheromatous arterial calcifications without aneurysm. No enlarged lymph nodes. Two renal arteries on the left. Reproductive: Mildly enlarged prostate gland. Other: Small bilateral inguinal hernias containing fat and very small umbilical artery containing fat. Musculoskeletal: Stable lumbar and lower thoracic spine degenerative changes and fusion of the L1 and L2 vertebral bodies. IMPRESSION: 1. Progressive pulmonary embolism on the right, with emboli currently demonstrated in the distal right main pulmonary artery and significantly more clot in the intersegmental and proximal right lower lobe pulmonary arteries. These results will be called to the ordering clinician or representative by the Radiologist Assistant, and communication documented in the PACS or [employer] 2. Lung densities and atelectasis with improvement since this morning. 3. Diffuse hepatic steatosis. 4. Multiple small, nonobstructing bilateral renal calculi. 5. Colonic diverticulosis. Electronically Signed   By: SClaudie ReveringM.D.   On: 05/15/2020 22:17   VAS [place]/ILIAC (VENOUS ONLY)  Result Date: 05/11/2020 IVC/ILIAC STUDY Indications: DVT, recent COVID Limitations: Air/bowel gas.  Comparison Study: No prior study Performing Technologist: MMaudry MayhewMHA, RDMS, RVT, RDCS  Examination Guidelines: A complete evaluation includes B-mode imaging, spectral Doppler, color Doppler, and power Doppler as needed of all accessible  portions of each vessel. Bilateral testing is considered an integral part of a complete examination. Limited examinations for reoccurring indications may be performed as noted.  IVC/Iliac Findings: +----------+------+--------+--------+    IVC    PatentThrombusComments +----------+------+--------+--------+ IVC Mid   patent                 +----------+------+--------+--------+ IVC Distalpatent                 +----------+------+--------+--------+  +-------------------+---------+-----------+---------+-----------+--------+         CIV  RT-PatentRT-ThrombusLT-PatentLT-ThrombusComments +-------------------+---------+-----------+---------+-----------+--------+ Common Iliac Prox                       patent                      +-------------------+---------+-----------+---------+-----------+--------+ Common Iliac Mid                        patent                      +-------------------+---------+-----------+---------+-----------+--------+ Common Iliac Distal patent              patent                      +-------------------+---------+-----------+---------+-----------+--------+  +-------------------------+---------+-----------+---------+-----------+--------+            EIV           RT-PatentRT-ThrombusLT-PatentLT-ThrombusComments +-------------------------+---------+-----------+---------+-----------+--------+ External Iliac Vein Prox                      patent                      +-------------------------+---------+-----------+---------+-----------+--------+ External Iliac Vein       patent                                          Distal                                                                    +-------------------------+---------+-----------+---------+-----------+--------+   Summary: IVC/Iliac: No evidence of thrombus in IVC and Iliac veins.  *See table(s) above for measurements and observations.  Electronically signed by Harold Barban MD on 05/11/2020 at 9:55:50 PM.    Final    DG Chest Port 1 View  Result Date: 05/30/2020 CLINICAL DATA:  Difficulty breathing, low O2 sats, history of COVID infection. EXAM: PORTABLE CHEST 1 VIEW COMPARISON:  05/29/2020 and CT chest 05/25/2020. FINDINGS: Heart size stable. Patchy mixed interstitial and airspace opacification bilaterally, increased from 05/29/2020. No pleural fluid. IMPRESSION: Worsening COVID-19 pneumonia. Electronically Signed   By: Lorin Picket M.D.   On: 05/30/2020 09:11   DG Chest Port 1 View  Result Date: 05/29/2020 CLINICAL DATA:  Shortness of breath.  Covid-19 viral infection. EXAM: PORTABLE CHEST 1 VIEW COMPARISON:  05/28/2020 FINDINGS: Heart size remains within normal limits. Diffuse interstitial infiltrates are again seen with mild patchy airspace opacities in the left lung base. These show no significant change since previous study. No evidence of pleural effusion. IMPRESSION: No significant change in diffuse interstitial infiltrates with mild patchy airspace disease in left lower lung. Electronically Signed   By: Marlaine Hind M.D.   On: 05/29/2020 08:36   DG Chest Port 1 View  Result Date: 05/28/2020 CLINICAL DATA:  Shortness of breath EXAM: PORTABLE CHEST 1 VIEW COMPARISON:  Three days ago FINDINGS: Patchy bilateral pulmonary infiltrate without convincing progression. Lung volumes are low. No visible effusion or air leak. Normal heart size and stable mediastinal contours. IMPRESSION: Similar degree of bilateral pulmonary infiltrates. No new abnormality.  Electronically Signed   By: Monte Fantasia M.D.   On: 05/28/2020 07:38   DG Chest Port 1 View  Result Date: 05/25/2020 CLINICAL DATA:  75 year old male with concern for sepsis. EXAM: PORTABLE CHEST 1 VIEW COMPARISON:  Chest CT dated 05/14/2020. FINDINGS: Diffuse bilateral streaky interstitial densities worsened since 05/09/2020 but relatively similar to 05/10/2020 concerning for residual infiltrate, likely on  the background of chronic changes. Clinical correlation and follow-up recommended. No lobar consolidation, pleural effusion, pneumothorax. The cardiac silhouette is within limits. No acute osseous pathology. IMPRESSION: Persistent bilateral infiltrates. Electronically Signed   By: Anner Crete M.D.   On: 05/25/2020 16:24   DG Chest Port 1 View  Result Date: 05/10/2020 CLINICAL DATA:  Short of breath, history of COPD EXAM: PORTABLE CHEST 1 VIEW COMPARISON:  Prior chest x-ray dated yesterday FINDINGS: Stable cardiac and mediastinal contours. Marked interval increase in diffuse interstitial prominence bilaterally now with Kerley B-lines in the periphery. Findings are most consistent with worsening CHF. No large effusion or pneumothorax. No acute osseous abnormality. IMPRESSION: Increasing interstitial pulmonary edema concerning for progressive CHF. Electronically Signed   By: Jacqulynn Cadet M.D.   On: 05/10/2020 07:54   DG Chest Port 1 View  Result Date: 05/09/2020 CLINICAL DATA:  Possible sepsis Fever and cough EXAM: PORTABLE CHEST 1 VIEW COMPARISON:  04/04/2020 FINDINGS: Heart size within normal limits. No pulmonary vascular congestion. Interval improvement in aeration of the lungs with bilateral airspace opacities still remaining. IMPRESSION: Interval improvement in aeration of the lungs with bilateral opacities still remaining consistent with resolving pneumonia. Electronically Signed   By: Miachel Roux M.D.   On: 05/09/2020 12:56   ECHOCARDIOGRAM COMPLETE  Result Date: 05/26/2020    ECHOCARDIOGRAM REPORT   Patient Name:   Tyrone Roman Date of Exam: 05/26/2020 Medical Rec #:  [id number]      Height:       74.0 in Accession #:    [id number]     Weight:       194.0 lb Date of Birth:  [birth year]      BSA:          2.146 m Patient Age:    48 years       BP:           129/92 mmHg Patient Gender: M              HR:           84 bpm. Exam Location:  Inpatient Procedure: 2D Echo, Cardiac Doppler, Color  Doppler and Intracardiac            Opacification Agent Indications:    Pulmonary Embolus I26.09  History:        Patient has prior history of Echocardiogram examinations, most                 recent 04/03/2020. CAD and Previous Myocardial Infarction, COPD,                 Signs/Symptoms:Shortness of Breath; Risk Factors:Hypertension                 and Dyslipidemia. COVID 19. Sepsis, due to unspecified organism,                 unspecified whether acute organ dysfunction present                 Multiple subsegmental pulmonary emboli without acute cor  pulmonale.  Sonographer:    Darlina Sicilian RDCS Referring Phys: [id number] [place]  1. Left ventricular ejection fraction, by estimation, is 50 to 55%. The left ventricle has low normal function. The left ventricle has no regional wall motion abnormalities. There is severe asymmetric left ventricular hypertrophy of the basal and septal  segments. Left ventricular diastolic parameters are consistent with Grade II diastolic dysfunction (pseudonormalization). Elevated left ventricular end-diastolic pressure.  2. Right ventricular systolic function is normal. The right ventricular size is normal.  3. The mitral valve is abnormal. No evidence of mitral valve regurgitation. No evidence of mitral stenosis.  4. The aortic valve is tricuspid. There is mild calcification of the aortic valve. Aortic valve regurgitation is not visualized. Mild aortic valve sclerosis is present, with no evidence of aortic valve stenosis.  5. Aortic dilatation noted. There is mild dilatation of the aortic root, measuring 41 mm.  6. The inferior vena cava is normal in size with greater than 50% respiratory variability, suggesting right atrial pressure of 3 mmHg. FINDINGS  Left Ventricle: Left ventricular ejection fraction, by estimation, is 50 to 55%. The left ventricle has low normal function. The left ventricle has no regional wall motion abnormalities. Definity  contrast agent was given IV to delineate the left ventricular endocardial borders. The left ventricular internal cavity size was normal in size. There is severe asymmetric left ventricular hypertrophy of the basal and septal segments. Left ventricular diastolic parameters are consistent with Grade II diastolic dysfunction (pseudonormalization). Elevated left ventricular end-diastolic pressure. Right Ventricle: The right ventricular size is normal. No increase in right ventricular wall thickness. Right ventricular systolic function is normal. Left Atrium: Left atrial size was normal in size. Right Atrium: Right atrial size was normal in size. Pericardium: There is no evidence of pericardial effusion. Mitral Valve: The mitral valve is abnormal. There is mild thickening of the mitral valve leaflet(s). There is mild calcification of the mitral valve leaflet(s). Mild mitral annular calcification. No evidence of mitral valve regurgitation. No evidence of mitral valve stenosis. Tricuspid Valve: The tricuspid valve is normal in structure. Tricuspid valve regurgitation is not demonstrated. No evidence of tricuspid stenosis. Aortic Valve: The aortic valve is tricuspid. There is mild calcification of the aortic valve. Aortic valve regurgitation is not visualized. Mild aortic valve sclerosis is present, with no evidence of aortic valve stenosis. Pulmonic Valve: The pulmonic valve was normal in structure. Pulmonic valve regurgitation is not visualized. No evidence of pulmonic stenosis. Aorta: The aortic root is normal in size and structure and aortic dilatation noted. There is mild dilatation of the aortic root, measuring 41 mm. Venous: The inferior vena cava is normal in size with greater than 50% respiratory variability, suggesting right atrial pressure of 3 mmHg. IAS/Shunts: No atrial level shunt detected by color flow Doppler.  LEFT VENTRICLE PLAX 2D LVIDd:         3.20 cm     Diastology LVIDs:         2.60 cm     LV e'  medial:    3.15 cm/s LV PW:         1.40 cm     LV E/e' medial:  17.1 LV IVS:        2.00 cm     LV e' lateral:   3.37 cm/s LVOT diam:     2.00 cm     LV E/e' lateral: 16.0 LV SV:         41 LV SV Index:  19 LVOT Area:     3.14 cm  LV Volumes (MOD) LV vol d, MOD A2C: 91.2 ml LV vol d, MOD A4C: 78.4 ml LV vol s, MOD A2C: 43.8 ml LV vol s, MOD A4C: 36.8 ml LV SV MOD A2C:     47.4 ml LV SV MOD A4C:     78.4 ml LV SV MOD BP:      44.4 ml RIGHT VENTRICLE RV S prime:     8.16 cm/s LEFT ATRIUM             Index       RIGHT ATRIUM          Index LA diam:        3.00 cm 1.40 cm/m  RA Area:     6.38 cm LA Vol (A2C):   20.1 ml 9.37 ml/m  RA Volume:   7.69 ml  3.58 ml/m LA Vol (A4C):   21.3 ml 9.93 ml/m LA Biplane Vol: 21.6 ml 10.07 ml/m  AORTIC VALVE LVOT Vmax:   80.50 cm/s LVOT Vmean:  60.800 cm/s LVOT VTI:    0.130 m  AORTA Ao Root diam: 4.10 cm Ao Asc diam:  3.40 cm MITRAL VALVE MV Area (PHT): 3.01 cm    SHUNTS MV Decel Time: 252 msec    Systemic VTI:  0.13 m MV E velocity: 53.80 cm/s  Systemic Diam: 2.00 cm MV A velocity: 81.50 cm/s MV E/A ratio:  0.66 Jenkins Rouge MD Electronically signed by Jenkins Rouge MD Signature Date/Time: 05/26/2020/2:30:51 PM    Final    VAS [place] LOWER EXTREMITY VENOUS (DVT)  Result Date: 05/26/2020  Lower Venous DVT Study Indications: Edema, and Swelling.  Comparison Study: 05/11/20 previous Performing Technologist: Abram Sander RVS  Examination Guidelines: A complete evaluation includes B-mode imaging, spectral Doppler, color Doppler, and power Doppler as needed of all accessible portions of each vessel. Bilateral testing is considered an integral part of a complete examination. Limited examinations for reoccurring indications may be performed as noted. The reflux portion of the exam is performed with the patient in reverse Trendelenburg.  +---------+---------------+---------+-----------+----------+--------------+ RIGHT    CompressibilityPhasicitySpontaneityPropertiesThrombus Aging  +---------+---------------+---------+-----------+----------+--------------+ CFV      Full           Yes      Yes                                 +---------+---------------+---------+-----------+----------+--------------+ SFJ      Full                                                        +---------+---------------+---------+-----------+----------+--------------+ FV Prox  Full                                                        +---------+---------------+---------+-----------+----------+--------------+ FV Mid   Full                                                        +---------+---------------+---------+-----------+----------+--------------+  FV DistalFull                                                        +---------+---------------+---------+-----------+----------+--------------+ PFV      Full                                                        +---------+---------------+---------+-----------+----------+--------------+ POP      Full           Yes      Yes                                 +---------+---------------+---------+-----------+----------+--------------+ PTV      Full                                                        +---------+---------------+---------+-----------+----------+--------------+ PERO     Full                                                        +---------+---------------+---------+-----------+----------+--------------+   +---------+---------------+---------+-----------+----------+-----------------+ LEFT     CompressibilityPhasicitySpontaneityPropertiesThrombus Aging    +---------+---------------+---------+-----------+----------+-----------------+ CFV      Full           Yes      Yes                                    +---------+---------------+---------+-----------+----------+-----------------+ SFJ      Full                                                            +---------+---------------+---------+-----------+----------+-----------------+ FV Prox  Full                                                           +---------+---------------+---------+-----------+----------+-----------------+ FV Mid   Full                                                           +---------+---------------+---------+-----------+----------+-----------------+ FV DistalFull                                                           +---------+---------------+---------+-----------+----------+-----------------+  PFV      Full                                                           +---------+---------------+---------+-----------+----------+-----------------+ POP      None           No       No                   Age Indeterminate +---------+---------------+---------+-----------+----------+-----------------+ PTV      None                                         Age Indeterminate +---------+---------------+---------+-----------+----------+-----------------+ PERO     None                                         Age Indeterminate +---------+---------------+---------+-----------+----------+-----------------+     Summary: RIGHT: - There is no evidence of deep vein thrombosis in the lower extremity.  - No cystic structure found in the popliteal fossa.  LEFT: - Findings consistent with age indeterminate deep vein thrombosis involving the left popliteal vein, left posterior tibial veins, and left peroneal veins. - No cystic structure found in the popliteal fossa.  *See table(s) above for measurements and observations. Electronically signed by Harold Barban MD on 05/26/2020 at 9:28:41 PM.    Final    VAS [place] LOWER EXTREMITY VENOUS (DVT)  Result Date: 05/11/2020  Lower Venous DVT Study Indications: Edema, Recent Covid infection, and SOB.  Comparison Study: 03/31/20 negative bilateral lower extremity venous duplex Performing Technologist: Maudry Mayhew MHA,  RDMS, RVT, RDCS  Examination Guidelines: A complete evaluation includes B-mode imaging, spectral Doppler, color Doppler, and power Doppler as needed of all accessible portions of each vessel. Bilateral testing is considered an integral part of a complete examination. Limited examinations for reoccurring indications may be performed as noted. The reflux portion of the exam is performed with the patient in reverse Trendelenburg.  +---------+---------------+---------+-----------+----------+--------------+ RIGHT    CompressibilityPhasicitySpontaneityPropertiesThrombus Aging +---------+---------------+---------+-----------+----------+--------------+ CFV      Full           Yes      Yes                                 +---------+---------------+---------+-----------+----------+--------------+ SFJ      Full                                                        +---------+---------------+---------+-----------+----------+--------------+ FV Prox  Full                                                        +---------+---------------+---------+-----------+----------+--------------+ FV Mid   Full                                                        +---------+---------------+---------+-----------+----------+--------------+  FV DistalFull                                                        +---------+---------------+---------+-----------+----------+--------------+ PFV      Full                                                        +---------+---------------+---------+-----------+----------+--------------+ POP      Full           Yes      Yes                                 +---------+---------------+---------+-----------+----------+--------------+ PTV      Full                                                        +---------+---------------+---------+-----------+----------+--------------+ PERO     Full                                                         +---------+---------------+---------+-----------+----------+--------------+ Gastroc  None                    No                   Acute          +---------+---------------+---------+-----------+----------+--------------+   +---------+---------------+---------+-----------+----------+--------------+ LEFT     CompressibilityPhasicitySpontaneityPropertiesThrombus Aging +---------+---------------+---------+-----------+----------+--------------+ CFV      Full           Yes      Yes                                 +---------+---------------+---------+-----------+----------+--------------+ SFJ      Full                                                        +---------+---------------+---------+-----------+----------+--------------+ FV Prox  None                    No                   Acute          +---------+---------------+---------+-----------+----------+--------------+ FV Mid   None                    No                   Acute          +---------+---------------+---------+-----------+----------+--------------+ FV DistalNone  No                   Acute          +---------+---------------+---------+-----------+----------+--------------+ PFV      Full                                                        +---------+---------------+---------+-----------+----------+--------------+ POP      None                    No                   Acute          +---------+---------------+---------+-----------+----------+--------------+ PTV      None                    No                   Acute          +---------+---------------+---------+-----------+----------+--------------+ PERO     None                    No                   Acute          +---------+---------------+---------+-----------+----------+--------------+     Summary: RIGHT: - Findings consistent with acute deep vein thrombosis involving the right gastrocnemius veins. - No  cystic structure found in the popliteal fossa.  LEFT: - Findings consistent with acute deep vein thrombosis involving the left femoral vein, left popliteal vein, left posterior tibial veins, and left peroneal veins. - No cystic structure found in the popliteal fossa.  *See table(s) above for measurements and observations. Electronically signed by Harold Barban MD on 05/11/2020 at 66:56:21 PM.    Final         Today   Subjective    Tyrone Roman today has no headache,no chest abdominal pain,no new weakness tingling or numbness, feels much better wants to go home today.     Objective   Blood pressure 118/80, pulse 76, temperature (!) 97.4 F (36.3 C), temperature source Oral, resp. rate 20, height '6\' 2"'  (1.88 m), weight 88 kg, SpO2 97 %.   Intake/Output Summary (Last 24 hours) at 06/02/2020 1041 Last data filed at 06/02/2020 0854 Gross per 24 hour  Intake 480 ml  Output 3825 ml  Net -3345 ml    Exam  Awake Alert, No new F.N deficits, Normal affect [place].AT,PERRAL Supple Neck,No JVD, No cervical lymphadenopathy appriciated.  Symmetrical Chest wall movement, Good air movement bilaterally, CTAB RRR,No Gallops,Rubs or new Murmurs, No Parasternal Heave +ve B.Sounds, Abd Soft, Non tender, No organomegaly appriciated, No rebound -guarding or rigidity. No Cyanosis, Clubbing or edema, No new Rash or bruise   Data Review   CBC w Diff:  Lab Results  Component Value Date   WBC 12.1 (H) 06/01/2020   HGB 12.4 (L) 06/01/2020   HCT 38.2 (L) 06/01/2020   PLT 168 06/01/2020   LYMPHOPCT 2 05/25/2020   MONOPCT 2 05/25/2020   EOSPCT 0 05/25/2020   BASOPCT 0 05/25/2020    CMP:  Lab Results  Component Value Date   NA 131 (L) 06/01/2020   K 5.2 (H) 06/01/2020   CL 99  06/01/2020   CO2 24 06/01/2020   BUN 38 (H) 06/01/2020   CREATININE 0.92 06/01/2020   CREATININE 1.64 (H) 02/18/2018   PROT 4.1 (L) 06/01/2020   ALBUMIN 2.4 (L) 06/01/2020   BILITOT 1.8 (H) 06/01/2020   ALKPHOS 79 06/01/2020    AST 55 (H) 06/01/2020   ALT 48 (H) 06/01/2020  .   Total Time in preparing paper work, data evaluation and todays exam - 42 minutes  Lala Lund M.D on 06/02/2020 at 10:41 AM  [facility]

## 2020-06-02 NOTE — TOC Progression Note (Addendum)
Transition of Care [facility]) - Progression Note    Patient Details  Name: Tyrone Roman MRN: [id number] Date of Birth: [birth year]  Transition of Care [facility]) CM/SW Contact  Zenon Mayo, RN Phone Number: 06/02/2020, 1:28 PM  Clinical Narrative:    NCM spoke with patient, he states the DME has been delivered to his home.   He states he just need assistance with transport home.  NCM spoke to Staff RN she states the secretary will set up transport for patient.  NCM asked if he was ambulatory because patient states he has a w/chair in the room.  He will need w/chair transport. NCM spoke with Staff RN, she states she spoke with patient sister and she will be probably transporting him home later today.  NCM informed her to call and let this NCM know what transport patient is going to need.    15:35 NCM received call from Staff RN , she states patient has home oxygen at home, [facility] will [facility].  Daughter will come by hospital later to pick up w/chair.  NCM notified [place] with [place].    Expected Discharge Plan: [place] Barriers to Discharge: Continued Medical Work up  Expected Discharge Plan and Services Expected Discharge Plan: [place]   Discharge Planning Services: CM Consult Post Acute Care Choice: Durable Medical Equipment,Home Health,Resumption of Svcs/PTA Provider Living arrangements for the past 2 months: Single Family Home Expected Discharge Date: 06/02/20               DME Arranged: Other see comment DME Agency: [employer] Date DME Agency Contacted: 05/31/20 Time DME Agency Contacted: 53 Representative spoke with at DME Agency: [facility] Arranged: RN,PT,OT,Nurse's [place] Work [employer] Agency: [place] Date [place]: 06/01/20 Time [place]: 1600 Representative spoke with at [place]: [place] ([place]) Interventions    Readmission Risk  Interventions No flowsheet data found.

## 2020-06-02 NOTE — Progress Notes (Signed)
Patient d/c from unit via [facility] transport. Discharge instructions given and education completed.

## 2020-06-02 NOTE — Consult Note (Signed)
   [facility] CM Inpatient Consult   06/02/2020  Tyrone Roman Aug 30, 1945 [id number]  Follow up:  Disposition needs  Patient is being recommended for a skilled nursing facility stay but according to PT evaluation continues to refuse.  24 hour supervision is recommended as well, attempts to call patient room [air-born precaution]  without an answer to discuss ongoing Embedded team to follow for transition of care. 3:26 called patient via hospital phone and HIPAA verified.  Introduced Armed forces training and education officer regarding Embedded CM follow up. Patient states that he has hired help to start Monday and his daughter and neighbors are in place to assist until Monday. He plans to follow up with PCP office.   Plan:  Embedded Team to continue to follow for TOC needs and appointments.  For questions,  Natividad Brood, RN BSN [facility] Liaison  [phone] business mobile phone Toll free office [phone]  Fax number: [phone] [email or web address]

## 2020-06-02 NOTE — Plan of Care (Signed)

## 2020-06-02 NOTE — Progress Notes (Signed)
Called patient's daughter, [name], to update her on pts status and discharge plan.

## 2020-06-02 NOTE — Care Management Important Message (Addendum)
Important Message  Patient Details  Name: Tyrone Roman MRN: [id number] Date of Birth: 11/26/1945   Medicare Important Message Given:  Yes pt. D/c today gave to nurse to give to the pt.,Pt on contact precations.     Holli Humbles Smith 06/02/2020, 9:35 AM

## 2020-06-03 ENCOUNTER — Other Ambulatory Visit: Payer: Medicare Other | Admitting: Nurse Practitioner

## 2020-06-03 ENCOUNTER — Other Ambulatory Visit: Payer: Self-pay

## 2020-06-03 DIAGNOSIS — R531 Weakness: Secondary | ICD-10-CM

## 2020-06-03 DIAGNOSIS — Z515 Encounter for palliative care: Secondary | ICD-10-CM | POA: Diagnosis not present

## 2020-06-03 NOTE — Progress Notes (Signed)
[place] Consult Note Telephone: [phone]  Fax: [phone]    Date of encounter: 06/04/20 PATIENT NAME: Tyrone Roman [address] [place] [address]   [phone] (home)  DOB: 16-Nov-1945 MRN: [id number]   PRIMARY CARE PROVIDER:    [name], MD,  [place] [address] [phone]  REFERRING PROVIDER:   [name], MD [address] [place],   [address] [phone]  RESPONSIBLE PARTY:    Contact Information    Name Relation Home Work Mobile   Roman,Tyrone Daughter   [phone]   Roman, Tyrone Daughter   [phone]     I met face to face with patient in home. Daughter Tyrone Roman present during visit. Palliative Care was asked to follow this patient by consultation request of  [name], MD to address advance care planning and complex medical decision making. This is the initial visit.                                     ASSESSMENT AND PLAN / RECOMMENDATIONS:   Advance Care Planning/Goals of Care: Goals include to maximize quality of life and symptom management. Our advance care planning conversation included a discussion about:     The value and importance of advance care planning   Experiences with loved ones who have been seriously ill or have died   Exploration of personal, cultural or spiritual beliefs that might influence medical decisions   Exploration of goals of care in the event of a sudden injury or illness   Identification and preparation of a healthcare agent   Review and updating or creation of an  advance directive document .  Decision not to resuscitate or to de-escalate disease focused treatments due to poor prognosis.  CODE STATUS: Full code Goal of care: Goal of care is function. Patient desire to keep working on regaining his strength so he can return to his prior function. He verbalized that one of his daughter will be graduating  from [employer] and he is planning on attending the ceremony. Directive: Signed MOST form present in home, sections reviewed with patient, no change made today. Details of MOST form include, Attempt at resuscitation (CPR), full scope of treatment, antibiotics if indicated, IV fluids if indicated, and feeding tube long-term if indicated. Patient reiterated desire for full resuscitation effort in the event of cardiac or respiratory arrest. 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 Validation provided. It was explained to patient that code status will be regularly reviewed to ensure that it reflects patient wishes. Patient requested guidance on how to complete documentation to select a power of attorney. Referral will be made for palliative care social worker for guidance. Palliative care will continue to provide support to patient, family and medical team.  I spent 50 minutes providing this consultation. More than 50% of the time in this consultation was spent in counseling and care coordination. ---------------------------------------------------------------------------------  Symptom Management/Plan: Generalized weakness: weakness related to deconditioning from recurrent hospitalization (7 ED visits with 5 leading to hospital admit), pending PT evaluation. Maintain safety and prevent falls. Assist with ADLs as needed, while encouraging him to do as much as possible for himself.  Patient requested for list of caregivers to assist patient with his ADLs. List of personal care providers emailed to patient and his daughter Tyrone Roman during  the visit. Depression: Patient endorsed sad mood, was teary during visit when talking about his poor physical function. Report staying up at night somedays with worry about his condition. He report improved sleep with current regimen of Ambien 39m at night time. He denied suicide or homicidal ideation. Patient denied need for antidepressant saying he does  not want to be altered. He report having good family support, his children take turns in caring for him. Provided general support and encouragement. Questions and concerns were addressed. Patient and family was encouraged to call with questions and/or concerns. My business card was provided.   Follow up Palliative Care Visit: Palliative care will continue to follow for complex medical decision making, advance care planning, and clarification of goals. Return in about 4 weeks or prn.  PPS: 30%  HOSPICE ELIGIBILITY/DIAGNOSIS: TBD  Chief Complaint: Generalized weakness  History obtained from review of Epic EMR and with patient and family.   HISTORY OF PRESENT ILLNESS:  Tyrone CISEKis a 75y.o. year old male with complaint of generalized weakness related to recent hospitalization for COVID -19 infection. Condition is persistent and severe, with patient being unable to bear weight on his legs. Patient is currently dependent on his daughter CLysbeth Galasfor his ADLs, he was discharged from hospital yesterday. He was discharged with Home health, awaiting evaluation by physical therapy. His other medical problems include CHF (Ef 60%), PE (on Eliquis), and chronic COVID-19 infection due to persistent immunosuppressive state, patient on continuous oxygen at 3L. He denied fever, denied chills, denied increased shortness of breath.  I reviewed available labs, medications, imaging, studies and related documents from the EMR.  Records reviewed and summarized above.   [facility] 06/03/2020 05/24/2020 09/23/2019  Decreased Interest 0 1 0  Down, Depressed, Hopeless 2 0 0  PHQ - 2 Score 2 1 0  Altered sleeping 3 - 0  Tired, decreased energy 2 - 0  Change in appetite 1 - 0  Feeling bad or failure about yourself  1 - 0  Trouble concentrating 0 - 0  Moving slowly or fidgety/restless 1 - 0  Suicidal thoughts 0 - 0  PHQ-9 Score 10 - 0  Difficult doing work/chores Somewhat difficult - Not difficult at  all  Some recent data might be hidden   ROS General: NAD EYES: denies acute vision change ENMT: denies dysphagia Cardiovascular: denies chest pain, endorsed DOE Pulmonary: denies new cough, denies increased SOB Abdomen: endorses good appetite, denies constipation, endorses continence of bowel GU: denies dysuria, endorses incontinence of urine MSK:  endorsed weakness, no falls reported Skin: denies rashes or wounds Neurological: denies uncontrolled pain, denies insomnia Psych: Endorses sad mood Heme/lymph/immuno: denies bruises, abnormal bleeding  Physical Exam: Vital signs: BP 130/68, P 77, RR , 95% on 3L General: frail appearing, thin, lying in bed in NAD EYES: anicteric sclera, no discharge  ENMT: intact hearing, oral mucous membranes moist CV: RRR, no LE edema Pulmonary: LCTA, no increased work of breathing, no cough Abdomen: soft and non tender, no ascites GU: deferred MSK: moderate sarcopenia, moves all extremities, non-ambulatory Skin: warm and dry, no rashes or wounds on visible skin Neuro: generalized weakness,  no cognitive impairment Psych: teary, A and O x 4 Hem/lymph/immuno: no widespread bruising  CURRENT PROBLEM LIST:  Patient Active Problem List   Diagnosis Date Noted  . Acute hypoxemic respiratory failure ([place] 05/25/2020  . Pulmonary embolus ([place] 05/25/2020  . SIRS (systemic inflammatory response syndrome) ([place] 05/25/2020  . Elevated troponin  05/25/2020  . Weakness of both lower extremities   . FUO (fever of unknown origin)   . Sepsis due to pneumonia ([place]) 05/09/2020  . Post-COVID-19 syndrome manifesting as chronic cough 05/09/2020  . COPD without exacerbation ([place]) 05/09/2020  . Essential hypertension 04/26/2020  . Acute hypoxemic respiratory failure due to COVID-19 ([place]) 04/03/2020  . Acute respiratory failure with hypoxia ([place]) 03/28/2020  . Physical deconditioning 03/26/2020  . Normocytic anemia 03/26/2020  . Severe sepsis ([place]) 03/25/2020  .  Multifocal pneumonia 03/17/2020  . COPD with acute exacerbation ([place]) 03/17/2020  . Hypoxia 03/17/2020  . CAD (coronary artery disease) 03/17/2020  . Thrombocytopenia ([place]) 03/08/2020  . Stage 3b chronic kidney disease ([place]) 03/08/2020  . Pneumonia due to COVID-19 virus 02/29/2020  . COVID-19 02/29/2020  . Closed nondisplaced fracture of shaft of third metacarpal bone of left hand 03/04/2019  . Wound infection after surgery 01/01/2018  . Medication monitoring encounter 01/01/2018  . Sepsis ([place]) 12/10/2017  . HNP (herniated nucleus pulposus), lumbar 10/07/2017  . Lumbar herniated disc 10/07/2017  . Psoriasis of scalp 03/27/2016  . Rheumatoid arthritis ([place]) 03/25/2013  . GI bleeding 02/20/2012  . Dyspnea on exertion 09/08/2010  . COPD (chronic obstructive pulmonary disease) ([place]) 08/04/2010  . Bruising 08/04/2010  . TINNITUS 12/16/2009  . Dizziness and giddiness 12/16/2009  . NECK SPRAIN AND STRAIN 10/21/2009  . LUMBAR SPRAIN AND STRAIN 10/21/2009  . Hyperlipidemia 06/10/2009  . Coronary artery disease of native artery of native heart with stable angina pectoris ([place]) 06/10/2009  . GERD 06/10/2009  . BARRETTS ESOPHAGUS 06/10/2009  . CONCUSSION WITH LOC OF 30 MINUTES OR LESS 02/28/2009  . NEPHROLITHIASIS 07/01/2007  . CONTACT DERMATITIS 12/12/2006  . BPH (benign prostatic hyperplasia) 11/04/2006  . COLONIC POLYPS 10/21/2003  . DIVERTICULOSIS, COLON 10/21/2003   PAST MEDICAL HISTORY:  Active Ambulatory Problems    Diagnosis Date Noted  . COLONIC POLYPS 10/21/2003  . Hyperlipidemia 06/10/2009  . TINNITUS 12/16/2009  . Coronary artery disease of native artery of native heart with stable angina pectoris ([place]) 06/10/2009  . GERD 06/10/2009  . BARRETTS ESOPHAGUS 06/10/2009  . DIVERTICULOSIS, COLON 10/21/2003  . NEPHROLITHIASIS 07/01/2007  . BPH (benign prostatic hyperplasia) 11/04/2006  . CONTACT DERMATITIS 12/12/2006  . Dizziness and giddiness 12/16/2009  . NECK SPRAIN AND  STRAIN 10/21/2009  . LUMBAR SPRAIN AND STRAIN 10/21/2009  . CONCUSSION WITH LOC OF 30 MINUTES OR LESS 02/28/2009  . COPD (chronic obstructive pulmonary disease) ([place]) 08/04/2010  . Bruising 08/04/2010  . Dyspnea on exertion 09/08/2010  . GI bleeding 02/20/2012  . Rheumatoid arthritis ([place]) 03/25/2013  . Psoriasis of scalp 03/27/2016  . HNP (herniated nucleus pulposus), lumbar 10/07/2017  . Lumbar herniated disc 10/07/2017  . Sepsis ([place]) 12/10/2017  . Wound infection after surgery 01/01/2018  . Medication monitoring encounter 01/01/2018  . Closed nondisplaced fracture of shaft of third metacarpal bone of left hand 03/04/2019  . Pneumonia due to COVID-19 virus 02/29/2020  . COVID-19 02/29/2020  . Thrombocytopenia ([place]) 03/08/2020  . Stage 3b chronic kidney disease ([place]) 03/08/2020  . Multifocal pneumonia 03/17/2020  . COPD with acute exacerbation ([place]) 03/17/2020  . Hypoxia 03/17/2020  . CAD (coronary artery disease) 03/17/2020  . Severe sepsis ([place]) 03/25/2020  . Physical deconditioning 03/26/2020  . Normocytic anemia 03/26/2020  . Acute respiratory failure with hypoxia ([place]) 03/28/2020  . Acute hypoxemic respiratory failure due to COVID-19 ([place]) 04/03/2020  . Essential hypertension 04/26/2020  . Sepsis due to pneumonia ([place]) 05/09/2020  . Post-COVID-19 syndrome  manifesting as chronic cough 05/09/2020  . COPD without exacerbation ([place]) 05/09/2020  . FUO (fever of unknown origin)   . Weakness of both lower extremities   . Acute hypoxemic respiratory failure ([place]) 05/25/2020  . Pulmonary embolus ([place]) 05/25/2020  . SIRS (systemic inflammatory response syndrome) ([place]) 05/25/2020  . Elevated troponin 05/25/2020   Resolved Ambulatory Problems    Diagnosis Date Noted  . Headache(784.0) 01/04/2010  . Shortness of breath 05/31/2009  . DYSPHAGIA UNSPECIFIED 07/01/2007  . LACERATION, SCALP 02/28/2009  . SYNCOPE 02/01/2010  . Unstable angina ([place]) 08/04/2010  . Altered mental  state 12/10/2017  . Constipation due to pain medication 12/10/2017  . MSSA bacteremia 12/12/2017  . PICC (peripherally inserted central catheter) in place 01/01/2018  . Contamination of blood culture 03/28/2020   Past Medical History:  Diagnosis Date  . Allergy   . Barrett's esophageal ulceration   . Cataract   . Colonic polyp   . Contact dermatitis   . Contact with or exposure to venereal diseases   . Diverticulosis of colon   . Dysphagia   . GERD (gastroesophageal reflux disease)   . Hypertension   . Myocardial infarction ([place])   . Nephrolithiasis   . SOB (shortness of breath)    SOCIAL HX:  Social History   Tobacco Use  . Smoking status: Former Smoker    Packs/day: 1.00    Years: 20.00    Pack years: 20.00    Types: Cigarettes    Quit date: 10/12/1988    Years since quitting: 31.6  . Smokeless tobacco: Never Used  . Tobacco comment: 1960's   Substance Use Topics  . Alcohol use: Not Currently    Alcohol/week: 0.0 standard drinks    Comment: rare   FAMILY HX:  Family History  Problem Relation Age of Onset  . Heart attack Father        cardiovascular disorder  . Arthritis Father        family hx  . Colon cancer Father        mets  . Prostate cancer Father        1st degree relative  . Arthritis Mother   . Colon polyps Mother   . Melanoma Mother   . Dementia Mother   . Diabetes Paternal Uncle   . Colon cancer Paternal Uncle   . Stomach cancer Paternal Uncle   . Melanoma Paternal Uncle   . Cancer Maternal Grandmother   . Skin cancer Daughter   . Colon polyps Sister   . Esophageal cancer Neg Hx   . Rectal cancer Neg Hx       ALLERGIES:  Allergies  Allergen Reactions  . Cephalexin Shortness Of Breath, Rash and Other (See Comments)    Tolerated Augmentin 2015 and 2017 (12-2017). Tolerated nafcillin 12/2017 PATIENT HAS HAD A PCN REACTION WITH IMMEDIATE RASH, FACIAL/TONGUE/THROAT SWELLING, SOB, OR LIGHTHEADEDNESS WITH HYPOTENSION:  #  #  YES  #  #  Has  patient had a PCN reaction causing severe rash involving mucus membranes or skin necrosis: No Has patient had a PCN reaction that required hospitalization: No Has patient had a PCN reaction occurring within the last 10 years: No If all of the above answers are "NO", then may proceed  . Clarithromycin Shortness Of Breath and Rash  . Certolizumab Pegol Hives  . Tocilizumab Hives  . Doxycycline Rash  . Hydroxyzine Rash  . Lidoderm Other (See Comments)    "made me act weird"  . Pyrithione Zinc  Rash     Thank you for the opportunity to participate in the care of Mr. Suit.  The palliative care team will continue to follow. Please call our office at [phone] if we can be of additional assistance.   Jari Favre, DNP, AGPCNP-BC  COVID-19 PATIENT SCREENING TOOL Asked and negative response unless otherwise noted:   Have you had symptoms of covid, tested positive or been in contact with someone with symptoms/positive test in the past 5-10 days?

## 2020-06-04 DIAGNOSIS — I2510 Atherosclerotic heart disease of native coronary artery without angina pectoris: Secondary | ICD-10-CM | POA: Diagnosis not present

## 2020-06-04 DIAGNOSIS — I130 Hypertensive heart and chronic kidney disease with heart failure and stage 1 through stage 4 chronic kidney disease, or unspecified chronic kidney disease: Secondary | ICD-10-CM | POA: Diagnosis not present

## 2020-06-04 DIAGNOSIS — J189 Pneumonia, unspecified organism: Secondary | ICD-10-CM | POA: Diagnosis not present

## 2020-06-04 DIAGNOSIS — J9601 Acute respiratory failure with hypoxia: Secondary | ICD-10-CM | POA: Diagnosis not present

## 2020-06-04 DIAGNOSIS — I82409 Acute embolism and thrombosis of unspecified deep veins of unspecified lower extremity: Secondary | ICD-10-CM | POA: Diagnosis not present

## 2020-06-04 DIAGNOSIS — I2699 Other pulmonary embolism without acute cor pulmonale: Secondary | ICD-10-CM | POA: Diagnosis not present

## 2020-06-06 ENCOUNTER — Ambulatory Visit: Payer: Medicare Other | Admitting: Internal Medicine

## 2020-06-06 DIAGNOSIS — I82409 Acute embolism and thrombosis of unspecified deep veins of unspecified lower extremity: Secondary | ICD-10-CM | POA: Diagnosis not present

## 2020-06-06 DIAGNOSIS — I2699 Other pulmonary embolism without acute cor pulmonale: Secondary | ICD-10-CM | POA: Diagnosis not present

## 2020-06-06 DIAGNOSIS — I2510 Atherosclerotic heart disease of native coronary artery without angina pectoris: Secondary | ICD-10-CM | POA: Diagnosis not present

## 2020-06-06 DIAGNOSIS — J9601 Acute respiratory failure with hypoxia: Secondary | ICD-10-CM | POA: Diagnosis not present

## 2020-06-06 DIAGNOSIS — J189 Pneumonia, unspecified organism: Secondary | ICD-10-CM | POA: Diagnosis not present

## 2020-06-06 DIAGNOSIS — I130 Hypertensive heart and chronic kidney disease with heart failure and stage 1 through stage 4 chronic kidney disease, or unspecified chronic kidney disease: Secondary | ICD-10-CM | POA: Diagnosis not present

## 2020-06-06 NOTE — Progress Notes (Deleted)
Cardiology Office Note:    Date:  06/06/2020   ID:  AUNDREY ELAHI, DOB 1946/01/30, MRN [id number]  PCP:  [name], MD   [place]  Cardiologist:  Werner Lean, MD  Advanced Practice Provider:  No care team member to display Electrophysiologist:  None      CC: Chest Pain follow up  History of Present Illness:    Tyrone Roman is a 75 y.o. male with a hx of CAD s/p PCI in 2011, HTN, HLD, COPD, Asthma, Asbestosis; RA; CKD stage IIIb; Covid 02/22/20 who presented with for evaluation 04/26/20.  In interim of this visit, patient has had multiple SOB and FUO evaluations in the hospital.  Repeat echo without any significant changes.    Patient notes that he is doing ***.  Did not received stress test because of inpatient admissions.  No chest pain or pressure ***.  No SOB/DOE*** and no PND/Orthopnea***.  No weight gain or leg swelling***.  No palpitations or syncope ***.  Ambulatory blood pressure ***.    Past Medical History:  Diagnosis Date  . Allergy   . Barrett's esophageal ulceration   . BPH (benign prostatic hyperplasia)   . CAD (coronary artery disease)    sees Dr. Mar Daring  . Cataract    both eyes removed   . Colonic polyp   . Concussion with loss of consciousness of 30 minutes or less   . Contact dermatitis   . Contact with or exposure to venereal diseases   . COPD (chronic obstructive pulmonary disease) ([place])    sees Dr. Kara Mead   . Diverticulosis of colon   . Dysphagia   . GERD (gastroesophageal reflux disease)    past hx- on meds   . HNP (herniated nucleus pulposus), lumbar    recurrent  . Hyperlipidemia   . Hypertension   . Myocardial infarction [facility])    2011- stent placed  . Nephrolithiasis   . Rheumatoid arthritis [facility])    sees Dr. Gavin Pound   . SOB (shortness of breath)     Past Surgical History:  Procedure Laterality Date  . BACK SURGERY    . CARDIAC CATHETERIZATION N/A 01/19/2016   Procedure: Left  Heart Cath and Coronary Angiography;  Surgeon: Burnell Blanks, MD;  Location: [place] CV [facility];  Service: Cardiovascular;  Laterality: N/A;  . COLONOSCOPY  12/09/2014   per Dr. Silverio Decamp, adenomatous polyps, repeat 3 years   . CORONARY ANGIOPLASTY WITH STENT PLACEMENT    . ESOPHAGOGASTRODUODENOSCOPY (EGD) WITH ESOPHAGEAL DILATION  02/17/09   Barretts esophagus   . EYE SURGERY    . KNEE ARTHROSCOPY Left X 2  . LUMBAR LAMINECTOMY/DECOMPRESSION MICRODISCECTOMY Right 10/07/2017   Procedure: RIGHT LUMBAR ONE-TWO LAMINECTOMY WITH MICRODISCECTOMY;  Surgeon: Consuella Lose, MD;  Location: [place];  Service: Neurosurgery;  Laterality: Right;  . LUMBAR LAMINECTOMY/DECOMPRESSION MICRODISCECTOMY N/A 12/06/2017   Procedure: RECURRENT MICRODISCECTOMY LUMBAR ONE - LUMBAR TWO;  Surgeon: Consuella Lose, MD;  Location: [place];  Service: Neurosurgery;  Laterality: N/A;  . LUMBAR WOUND DEBRIDEMENT N/A 12/11/2017   Procedure: LUMBAR WOUND DEBRIDEMENT;  Surgeon: Consuella Lose, MD;  Location: [place];  Service: Neurosurgery;  Laterality: N/A;  . POLYPECTOMY    . TONSILLECTOMY    . UPPER GASTROINTESTINAL ENDOSCOPY      Current Medications: No outpatient medications have been marked as taking for the 06/06/20 encounter (Appointment) with Werner Lean, MD.     Allergies:   Cephalexin, Clarithromycin,  Certolizumab pegol, Tocilizumab, Doxycycline, Hydroxyzine, Lidoderm, and Pyrithione zinc   Social History   Socioeconomic History  . Marital status: Divorced    Spouse name: Not on file  . Number of children: 4  . Years of education: Not on file  . Highest education level: Not on file  Occupational History  . Occupation: ENGINEER    Employer: [employer]  Tobacco Use  . Smoking status: Former Smoker    Packs/day: 1.00    Years: 20.00    Pack years: 20.00    Types: Cigarettes    Quit date: 10/12/1988    Years since quitting: 31.6  . Smokeless tobacco: Never Used  . Tobacco comment:  1960's   Vaping Use  . Vaping Use: Never used  Substance and Sexual Activity  . Alcohol use: Not Currently    Alcohol/week: 0.0 standard drinks    Comment: rare  . Drug use: No  . Sexual activity: Not on file  Other Topics Concern  . 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       04/14/2018:   Lives alone, has girlfriend who visits, main source of emotional support   Has been struggling with recent hospitalizations/ill health following sepsis and back surgeries   Social Determinants of Health   Financial Resource Strain: Low Risk   . Difficulty of Paying Living Expenses: Not hard at all  Food Insecurity: No Food Insecurity  . Worried About Charity fundraiser in the Last Year: Never true  . Ran Out of Food in the Last Year: Never true  Transportation Needs: No Transportation Needs  . Lack of Transportation (Medical): No  . Lack of Transportation (Non-Medical): No  Physical Activity: Insufficiently Active  . Days of Exercise per Week: 2 days  . Minutes of Exercise per Session: 60 min  Stress: Stress Concern Present  . Feeling of Stress : To some extent  Social Connections: Moderately Isolated  . Frequency of Communication with Friends and Family: More than three times a week  . Frequency of Social Gatherings with Friends and Family: Once a week  . Attends Religious Services: Never  . Active Member of Clubs or Organizations: Yes  . Attends Archivist Meetings: More than 4 times per year  . Marital Status: Divorced    Social:  Initialyl came in with daughter  Family History: The patient's family history includes Arthritis in his father and mother; Cancer in his maternal grandmother; Colon cancer in his father and paternal uncle; Colon polyps in his mother and sister; Dementia in his mother; Diabetes in his paternal uncle; Heart attack in his father; Melanoma in his mother and  paternal uncle; Prostate cancer in his father; Skin cancer in his daughter; Stomach cancer in his paternal uncle. There is no history of Esophageal cancer or Rectal cancer.  ROS:   Please see the history of present illness.     All other systems reviewed and are negative.  EKGs/Labs/Other Studies Reviewed:    The following studies were reviewed today: EKG:   04/05/20: SR RBBB Rate 74 septal infarct and inferior TWI  Transthoracic Echocardiogram: Date: 04/03/20 Results: 1. Left ventricular ejection fraction, by estimation, is 60 to 65%. The  left ventricle has no regional wall motion abnormalities. Left ventricular  diastolic parameters are indeterminate.  2. Right ventricular systolic function is normal. The right ventricular  size is normal.  3. Limited echo  to evaluate LV function.   NM Stress Testing : Date: 01/03/2016 Results:  Nuclear stress EF: 63%.  There was no ST segment deviation noted during stress.  The study is normal.  This is a low risk study.  The left ventricular ejection fraction is normal (55-65%).   Left/Right Heart Catheterizations: Date:04/26/20 Results:  The left ventricular systolic function is normal.  LV end diastolic pressure is normal.  The left ventricular ejection fraction is 50-55% by visual estimate.  There is no mitral valve regurgitation.  Prox RCA lesion, 20 %stenosed.  2nd Mrg lesion, 20 %stenosed.  Prox LAD to Mid LAD lesion, 10 %stenosed.  Mid LAD to Dist LAD lesion, 10 %stenosed.   1. Single vessel CAD with patent stent in the proximal LAD with minimal restenosis 2. Mild plaque in the non-dominant RCA  3. Normal LV systolic function  Recommendations: Continue medical management of CAD.    Recent Labs: 05/10/2020: TSH 1.040 06/01/2020: ALT 48; B Natriuretic Peptide 294.8; BUN 38; Creatinine, Ser 0.92; Hemoglobin 12.4; Magnesium 2.0; Platelets 168; Potassium 5.2; Sodium 131  Recent Lipid Panel    Component Value  Date/Time   CHOL 158 05/11/2019 1334   TRIG 201 (H) 02/29/2020 1343   HDL 47.70 05/11/2019 1334   CHOLHDL 3 05/11/2019 1334   VLDL 27.8 05/11/2019 1334   LDLCALC 83 05/11/2019 1334    Risk Assessment/Calculations:    N/A  Physical Exam:    VS:  There were no vitals taken for this visit.    Wt Readings from Last 3 Encounters:  05/25/20 194 lb 0.1 oz (88 kg)  05/09/20 194 lb 0.1 oz (88 kg)  05/05/20 195 lb (88.5 kg)    GEN: Elderly Male in Mild Distress HEENT: Left ear Frank's Sign NECK: No JVD LYMPHATICS: No lymphadenopathy CARDIAC: RRR, no murmurs, rubs, gallops RESPIRATORY:  Coarse breath sounds bilaterally with no wheezes ABDOMEN: Soft, non-tender, non-distended MUSCULOSKELETAL:  No edema; No deformity  SKIN: Warm and dry NEUROLOGIC:  Alert and oriented x 3 PSYCHIATRIC:  Normal affect   ASSESSMENT:    No diagnosis found. PLAN:    In order of problems listed above:  Coronary Artery Disease; Obstructive COPD with no active wheeze Asbestosis NOS COVID- post 19 sequelae HTN HLD CKD Stage IIIb - symptomatic  - anatomy: mild ISR LAD stents in 2017 - continue ASA 81 mg - continue statin, goal LDL < 70 (will attempt to further define prevention plans at next visit) - unclear if BB intolerant with multiple reports or high and low HR and BP; will DC - continue nitrates as PRN and start Imdur 30 mg; if unable to tolerate start Ranexa - *** repeat stress test      Medication Adjustments/Labs and Tests Ordered: Current medicines are reviewed at length with the patient today.  Concerns regarding medicines are outlined above.  No orders of the defined types were placed in this encounter.  No orders of the defined types were placed in this encounter.   There are no Patient Instructions on file for this visit.   Signed, Werner Lean, MD  06/06/2020 8:02 AM    [place]

## 2020-06-07 ENCOUNTER — Telehealth: Payer: Self-pay | Admitting: Family Medicine

## 2020-06-07 ENCOUNTER — Telehealth: Payer: Self-pay

## 2020-06-07 ENCOUNTER — Telehealth: Payer: Medicare Other

## 2020-06-07 DIAGNOSIS — I2699 Other pulmonary embolism without acute cor pulmonale: Secondary | ICD-10-CM | POA: Diagnosis not present

## 2020-06-07 DIAGNOSIS — I130 Hypertensive heart and chronic kidney disease with heart failure and stage 1 through stage 4 chronic kidney disease, or unspecified chronic kidney disease: Secondary | ICD-10-CM | POA: Diagnosis not present

## 2020-06-07 DIAGNOSIS — J9601 Acute respiratory failure with hypoxia: Secondary | ICD-10-CM | POA: Diagnosis not present

## 2020-06-07 DIAGNOSIS — I2510 Atherosclerotic heart disease of native coronary artery without angina pectoris: Secondary | ICD-10-CM | POA: Diagnosis not present

## 2020-06-07 DIAGNOSIS — I82409 Acute embolism and thrombosis of unspecified deep veins of unspecified lower extremity: Secondary | ICD-10-CM | POA: Diagnosis not present

## 2020-06-07 DIAGNOSIS — J189 Pneumonia, unspecified organism: Secondary | ICD-10-CM | POA: Diagnosis not present

## 2020-06-07 NOTE — Telephone Encounter (Signed)
Eustaquio Maize is calling and wanted to let the provider know that patient has sores on his tongue and he has a sore throat with some redness. Nurse wanted to see if provider could call him in some mouthwash. Also nurse wanted to see if provider could order a wide bedside commode and if patient can continue to take [facility] instead of Symbicort, please advise. CB is [phone]

## 2020-06-07 NOTE — Telephone Encounter (Signed)
(  3:30p) SW completed follow-up call per request of NP-Queeneth to assess his need for assistance with [facility] paperwork. Patient stated he did not need any assistance as he he has an attorney coming out tomorrow. He thanked SW for the call.

## 2020-06-08 ENCOUNTER — Encounter: Payer: Self-pay | Admitting: Family Medicine

## 2020-06-08 ENCOUNTER — Ambulatory Visit: Payer: Medicare Other | Admitting: Pharmacist

## 2020-06-08 ENCOUNTER — Telehealth (INDEPENDENT_AMBULATORY_CARE_PROVIDER_SITE_OTHER): Payer: Medicare Other | Admitting: Family Medicine

## 2020-06-08 DIAGNOSIS — J189 Pneumonia, unspecified organism: Secondary | ICD-10-CM | POA: Diagnosis not present

## 2020-06-08 DIAGNOSIS — I82409 Acute embolism and thrombosis of unspecified deep veins of unspecified lower extremity: Secondary | ICD-10-CM | POA: Diagnosis not present

## 2020-06-08 DIAGNOSIS — I10 Essential (primary) hypertension: Secondary | ICD-10-CM

## 2020-06-08 DIAGNOSIS — J9601 Acute respiratory failure with hypoxia: Secondary | ICD-10-CM | POA: Diagnosis not present

## 2020-06-08 DIAGNOSIS — I130 Hypertensive heart and chronic kidney disease with heart failure and stage 1 through stage 4 chronic kidney disease, or unspecified chronic kidney disease: Secondary | ICD-10-CM | POA: Diagnosis not present

## 2020-06-08 DIAGNOSIS — I25118 Atherosclerotic heart disease of native coronary artery with other forms of angina pectoris: Secondary | ICD-10-CM

## 2020-06-08 DIAGNOSIS — I2510 Atherosclerotic heart disease of native coronary artery without angina pectoris: Secondary | ICD-10-CM | POA: Diagnosis not present

## 2020-06-08 DIAGNOSIS — B370 Candidal stomatitis: Secondary | ICD-10-CM | POA: Diagnosis not present

## 2020-06-08 DIAGNOSIS — J441 Chronic obstructive pulmonary disease with (acute) exacerbation: Secondary | ICD-10-CM

## 2020-06-08 DIAGNOSIS — I2699 Other pulmonary embolism without acute cor pulmonale: Secondary | ICD-10-CM | POA: Diagnosis not present

## 2020-06-08 MED ORDER — NYSTATIN 100000 UNIT/ML MT SUSP: 5.0000 mL | Freq: Four times a day (QID) | OROMUCOSAL | 5 refills | Status: AC

## 2020-06-08 NOTE — Telephone Encounter (Signed)
Pt orders for bedside commode and lift chair have been faxed to [place]

## 2020-06-08 NOTE — Progress Notes (Signed)
Called patient to discuss patient assistance options for medications. Went over pricing of Lantus, Dulera, and Humalog (paid $500) and Xarelto all of which were expensive for the patient. Verified that patient was taking Xarelto and not at the same time as Eliquis.    Discussed patient assistance options for all medications and patient would likely not qualify based on current income. Verified formulary and all medications except Dulera were preferred brands. Informed patient's daughter that Symbicort is the preferred brand. Confirmed that Humalog and Lantus should be $42 after paying the deductible.

## 2020-06-08 NOTE — Telephone Encounter (Signed)
Patient is scheduled for a virtual today 06/08/2020

## 2020-06-08 NOTE — Progress Notes (Signed)
Subjective:    Patient ID: Tyrone Roman, male    DOB: 29-Apr-1945, 75 y.o.   MRN: [id number]  HPI Virtual Visit via Video Note  I connected with the patient on 06/08/20 at  9:30 AM EDT by a video enabled telemedicine application and verified that I am speaking with the correct person using two identifiers.  Location patient: home Location provider:work or home office Persons participating in the virtual visit: patient, provider  I discussed the limitations of evaluation and management by telemedicine and the availability of in person appointments. The patient expressed understanding and agreed to proceed.   HPI: Here for 3 days of soreness in the mouth, on the tongue, and in the throat. No fever or swollen nodes in the neck. He is on high dose Prednisone.    ROS: See pertinent positives and negatives per HPI.  Past Medical History:  Diagnosis Date  . Allergy   . Barrett's esophageal ulceration   . BPH (benign prostatic hyperplasia)   . CAD (coronary artery disease)    sees Dr. Mar Daring  . Cataract    both eyes removed   . Colonic polyp   . Concussion with loss of consciousness of 30 minutes or less   . Contact dermatitis   . Contact with or exposure to venereal diseases   . COPD (chronic obstructive pulmonary disease) ([place])    sees Dr. Kara Mead   . Diverticulosis of colon   . Dysphagia   . GERD (gastroesophageal reflux disease)    past hx- on meds   . HNP (herniated nucleus pulposus), lumbar    recurrent  . Hyperlipidemia   . Hypertension   . Myocardial infarction [facility])    2011- stent placed  . Nephrolithiasis   . Rheumatoid arthritis [facility])    sees Dr. Gavin Pound   . SOB (shortness of breath)     Past Surgical History:  Procedure Laterality Date  . BACK SURGERY    . CARDIAC CATHETERIZATION N/A 01/19/2016   Procedure: Left Heart Cath and Coronary Angiography;  Surgeon: Burnell Blanks, MD;  Location: [place] CV LAB;  Service: Cardiovascular;   Laterality: N/A;  . COLONOSCOPY  12/09/2014   per Dr. Silverio Decamp, adenomatous polyps, repeat 3 years   . CORONARY ANGIOPLASTY WITH STENT PLACEMENT    . ESOPHAGOGASTRODUODENOSCOPY (EGD) WITH ESOPHAGEAL DILATION  02/17/09   Barretts esophagus   . EYE SURGERY    . KNEE ARTHROSCOPY Left X 2  . LUMBAR LAMINECTOMY/DECOMPRESSION MICRODISCECTOMY Right 10/07/2017   Procedure: RIGHT LUMBAR ONE-TWO LAMINECTOMY WITH MICRODISCECTOMY;  Surgeon: Consuella Lose, MD;  Location: [place];  Service: Neurosurgery;  Laterality: Right;  . LUMBAR LAMINECTOMY/DECOMPRESSION MICRODISCECTOMY N/A 12/06/2017   Procedure: RECURRENT MICRODISCECTOMY LUMBAR ONE - LUMBAR TWO;  Surgeon: Consuella Lose, MD;  Location: [place];  Service: Neurosurgery;  Laterality: N/A;  . LUMBAR WOUND DEBRIDEMENT N/A 12/11/2017   Procedure: LUMBAR WOUND DEBRIDEMENT;  Surgeon: Consuella Lose, MD;  Location: [place];  Service: Neurosurgery;  Laterality: N/A;  . POLYPECTOMY    . TONSILLECTOMY    . UPPER GASTROINTESTINAL ENDOSCOPY      Family History  Problem Relation Age of Onset  . Heart attack Father        cardiovascular disorder  . Arthritis Father        family hx  . Colon cancer Father        mets  . Prostate cancer Father        1st degree relative  .  Arthritis Mother   . Colon polyps Mother   . Melanoma Mother   . Dementia Mother   . Diabetes Paternal Uncle   . Colon cancer Paternal Uncle   . Stomach cancer Paternal Uncle   . Melanoma Paternal Uncle   . Cancer Maternal Grandmother   . Skin cancer Daughter   . Colon polyps Sister   . Esophageal cancer Neg Hx   . Rectal cancer Neg Hx      Current Outpatient Medications:  .  Accu-Chek Softclix Lancets lancets, Use as directed., Disp: 100 each, Rfl: 5 .  acetaminophen (TYLENOL) 325 MG tablet, Take 650 mg by mouth every 6 (six) hours as needed for headache, mild pain or fever., Disp: , Rfl:  .  albuterol (VENTOLIN HFA) 108 (90 Base) MCG/ACT inhaler, Inhale 2 puffs into the  lungs every 4 (four) hours as needed for wheezing or shortness of breath., Disp: 18 g, Rfl: 0 .  Blood Glucose Monitoring Suppl (ACCU-CHEK GUIDE) w/Device KIT, Use as directed, Disp: 1 kit, Rfl: 0 .  budesonide-formoterol (SYMBICORT) 160-4.5 MCG/ACT inhaler, Inhale 2 puffs into the lungs in the morning and at bedtime., Disp: 10.2 g, Rfl: 0 .  glucose blood (ACCU-CHEK GUIDE) test strip, Use as instructed up to 4 times daily, Disp: 100 each, Rfl: 12 .  insulin glargine (LANTUS) 100 UNIT/ML Solostar Pen, Inject 15 Units into the skin at bedtime., Disp: 15 mL, Rfl: 0 .  insulin lispro (HUMALOG KWIKPEN) 100 UNIT/ML KwikPen, Before each meal 3 times a day, 140-199 - 2 units, 200-250 - 4 units, 251-299 - 6 units,  300-349 - 8 units,  350 or above 10 units. (Patient taking differently: Before each meal 3 times a day, 140-199 - 2 units, 200-250 - 4 units, 251-299 - 6 units,  300-349 - 8 units,  350 or above 10 units.), Disp: 15 mL, Rfl: 0 .  Insulin Pen Needle 32G X 4 MM MISC, use as directed with insulin pens (Patient taking differently: use as directed with insulin pens), Disp: 100 each, Rfl: 0 .  lactose free nutrition (BOOST PLUS) LIQD, Take 237 mLs by mouth 3 (three) times daily with meals., Disp: 21330 mL, Rfl: 0 .  melatonin 5 MG TABS, Take 5 mg by mouth at bedtime as needed (sleep)., Disp: , Rfl:  .  Menthol (ICY HOT) 5 % PTCH, Apply 1 patch topically 2 (two) times daily as needed (back pain). Apply to lower back., Disp: , Rfl:  .  metoprolol tartrate (LOPRESSOR) 50 MG tablet, Take 1 tablet (50 mg total) by mouth 2 (two) times daily., Disp: 60 tablet, Rfl: 0 .  nystatin (MYCOSTATIN) 100000 UNIT/ML suspension, Take 5 mLs (500,000 Units total) by mouth 4 (four) times daily., Disp: 473 mL, Rfl: 5 .  omeprazole (PRILOSEC) 40 MG capsule, Take 1 capsule (40 mg total) by mouth daily., Disp: 30 capsule, Rfl: 0 .  predniSONE (DELTASONE) 10 MG tablet, Take 3 tablets (30 mg total) by mouth every 12 (twelve) hours.,  Disp: 180 tablet, Rfl: 0 .  Rivaroxaban (XARELTO) 15 MG TABS tablet, Take 1 tablet (15 mg total) by mouth 2 (two) times daily with a meal for 13 days., Disp: 26 tablet, Rfl: 0 .  [START ON 06/16/2020] rivaroxaban (XARELTO) 20 MG TABS tablet, Take 1 tablet (20 mg total) by mouth daily with supper. Start after completing Xarelto 67m twice daily tablets, Disp: 30 tablet, Rfl: 0 .  simvastatin (ZOCOR) 20 MG tablet, Take 1 tablet (20 mg total) by  mouth daily., Disp: 30 tablet, Rfl: 0 .  sulfamethoxazole-trimethoprim (BACTRIM DS) 800-160 MG tablet, Take 1 tablet by mouth 3 (three) times a week., Disp: 20 tablet, Rfl: 0 .  tamsulosin (FLOMAX) 0.4 MG CAPS capsule, Take 1 capsule (0.4 mg total) by mouth daily., Disp: 30 capsule, Rfl: 0 .  Tiotropium Bromide Monohydrate (SPIRIVA RESPIMAT) 2.5 MCG/ACT AERS, Inhale 2 puffs into the lungs daily., Disp: 4 g, Rfl: 0  EXAM:  VITALS per patient if applicable:  GENERAL: alert, oriented, appears well and in no acute distress  HEENT: atraumatic, conjunttiva clear, no obvious abnormalities on inspection of external nose and ears  NECK: normal movements of the head and neck  LUNGS: on inspection no signs of respiratory distress, breathing rate appears normal, no obvious gross SOB, gasping or wheezing  CV: no obvious cyanosis  MS: moves all visible extremities without noticeable abnormality  PSYCH/NEURO: pleasant and cooperative, no obvious depression or anxiety, speech and thought processing grossly intact  ASSESSMENT AND PLAN: Thrush, treat with Nystatin oral suspension QID as needed. [name] MD

## 2020-06-08 NOTE — Telephone Encounter (Signed)
The order for the bedside commode is ready. I will discuss the other issue with him during our [place] today

## 2020-06-09 ENCOUNTER — Ambulatory Visit (INDEPENDENT_AMBULATORY_CARE_PROVIDER_SITE_OTHER): Payer: Medicare Other

## 2020-06-09 ENCOUNTER — Telehealth (HOSPITAL_COMMUNITY): Payer: Self-pay | Admitting: Pharmacist

## 2020-06-09 DIAGNOSIS — I82409 Acute embolism and thrombosis of unspecified deep veins of unspecified lower extremity: Secondary | ICD-10-CM | POA: Diagnosis not present

## 2020-06-09 DIAGNOSIS — J439 Emphysema, unspecified: Secondary | ICD-10-CM

## 2020-06-09 DIAGNOSIS — J9601 Acute respiratory failure with hypoxia: Secondary | ICD-10-CM | POA: Diagnosis not present

## 2020-06-09 DIAGNOSIS — I130 Hypertensive heart and chronic kidney disease with heart failure and stage 1 through stage 4 chronic kidney disease, or unspecified chronic kidney disease: Secondary | ICD-10-CM | POA: Diagnosis not present

## 2020-06-09 DIAGNOSIS — U071 COVID-19: Secondary | ICD-10-CM

## 2020-06-09 DIAGNOSIS — I2510 Atherosclerotic heart disease of native coronary artery without angina pectoris: Secondary | ICD-10-CM | POA: Diagnosis not present

## 2020-06-09 DIAGNOSIS — E782 Mixed hyperlipidemia: Secondary | ICD-10-CM | POA: Diagnosis not present

## 2020-06-09 DIAGNOSIS — I2699 Other pulmonary embolism without acute cor pulmonale: Secondary | ICD-10-CM | POA: Diagnosis not present

## 2020-06-09 DIAGNOSIS — I25118 Atherosclerotic heart disease of native coronary artery with other forms of angina pectoris: Secondary | ICD-10-CM

## 2020-06-09 DIAGNOSIS — J189 Pneumonia, unspecified organism: Secondary | ICD-10-CM | POA: Diagnosis not present

## 2020-06-09 NOTE — Chronic Care Management (AMB) (Signed)
Chronic Care Management   CCM RN Visit Note  06/09/2020 Name: Tyrone Roman MRN: [id number] DOB: 1945-09-09  Subjective: Tyrone Roman is a 75 y.o. year old male who is a primary care patient of [name], MD. The care management team was consulted for assistance with disease management and care coordination needs.    Engaged with patient by telephone for follow up visit in response to provider referral for case management and/or care coordination services.   Consent to Services:  The patient was given information about Chronic Care Management services, agreed to services, and gave verbal consent prior to initiation of services.  Please see initial visit note for detailed documentation.   Patient agreed to services and verbal consent obtained.   Assessment: Review of patient past medical history, allergies, medications, health status, including review of consultants reports, laboratory and other test data, was performed as part of comprehensive evaluation and provision of chronic care management services.   SDOH (Social Determinants of Health) assessments and interventions performed:    CCM Care Plan  Allergies  Allergen Reactions  . Cephalexin Shortness Of Breath, Rash and Other (See Comments)    Tolerated Augmentin 2015 and 2017 (12-2017). Tolerated nafcillin 12/2017 PATIENT HAS HAD A PCN REACTION WITH IMMEDIATE RASH, FACIAL/TONGUE/THROAT SWELLING, SOB, OR LIGHTHEADEDNESS WITH HYPOTENSION:  #  #  YES  #  #  Has patient had a PCN reaction causing severe rash involving mucus membranes or skin necrosis: No Has patient had a PCN reaction that required hospitalization: No Has patient had a PCN reaction occurring within the last 10 years: No If all of the above answers are "NO", then may proceed  . Clarithromycin Shortness Of Breath and Rash  . Certolizumab Pegol Hives  . Tocilizumab Hives  . Doxycycline Rash  . Hydroxyzine Rash  . Lidoderm Other (See Comments)    "made me act  weird"  . Pyrithione Zinc Rash    Outpatient Encounter Medications as of 06/09/2020  Medication Sig Note  . Accu-Chek Softclix Lancets lancets Use as directed.   Marland Kitchen acetaminophen (TYLENOL) 325 MG tablet Take 650 mg by mouth every 6 (six) hours as needed for headache, mild pain or fever.   Marland Kitchen albuterol (VENTOLIN HFA) 108 (90 Base) MCG/ACT inhaler Inhale 2 puffs into the lungs every 4 (four) hours as needed for wheezing or shortness of breath.   . Blood Glucose Monitoring Suppl (ACCU-CHEK GUIDE) w/Device KIT Use as directed   . budesonide-formoterol (SYMBICORT) 160-4.5 MCG/ACT inhaler Inhale 2 puffs into the lungs in the morning and at bedtime. 05/26/2020: New Rx  . glucose blood (ACCU-CHEK GUIDE) test strip Use as instructed up to 4 times daily   . insulin glargine (LANTUS) 100 UNIT/ML Solostar Pen Inject 15 Units into the skin at bedtime.   . insulin lispro (HUMALOG KWIKPEN) 100 UNIT/ML KwikPen Before each meal 3 times a day, 140-199 - 2 units, 200-250 - 4 units, 251-299 - 6 units,  300-349 - 8 units,  350 or above 10 units. (Patient taking differently: Before each meal 3 times a day, 140-199 - 2 units, 200-250 - 4 units, 251-299 - 6 units,  300-349 - 8 units,  350 or above 10 units.)   . Insulin Pen Needle 32G X 4 MM MISC use as directed with insulin pens (Patient taking differently: use as directed with insulin pens)   . lactose free nutrition (BOOST PLUS) LIQD Take 237 mLs by mouth 3 (three) times daily with meals.   Marland Kitchen  melatonin 5 MG TABS Take 5 mg by mouth at bedtime as needed (sleep).   . Menthol (ICY HOT) 5 % PTCH Apply 1 patch topically 2 (two) times daily as needed (back pain). Apply to lower back.   . metoprolol tartrate (LOPRESSOR) 50 MG tablet Take 1 tablet (50 mg total) by mouth 2 (two) times daily.   Marland Kitchen nystatin (MYCOSTATIN) 100000 UNIT/ML suspension Take 5 mLs (500,000 Units total) by mouth 4 (four) times daily.   Marland Kitchen omeprazole (PRILOSEC) 40 MG capsule Take 1 capsule (40 mg total) by mouth  daily.   . predniSONE (DELTASONE) 10 MG tablet Take 3 tablets (30 mg total) by mouth every 12 (twelve) hours.   . Rivaroxaban (XARELTO) 15 MG TABS tablet Take 1 tablet (15 mg total) by mouth 2 (two) times daily with a meal for 13 days.   Derrill Memo ON 06/16/2020] rivaroxaban (XARELTO) 20 MG TABS tablet Take 1 tablet (20 mg total) by mouth daily with supper. Start after completing Xarelto 15mg  twice daily tablets   . simvastatin (ZOCOR) 20 MG tablet Take 1 tablet (20 mg total) by mouth daily.   Marland Kitchen sulfamethoxazole-trimethoprim (BACTRIM DS) 800-160 MG tablet Take 1 tablet by mouth 3 (three) times a week.   . tamsulosin (FLOMAX) 0.4 MG CAPS capsule Take 1 capsule (0.4 mg total) by mouth daily.   . Tiotropium Bromide Monohydrate (SPIRIVA RESPIMAT) 2.5 MCG/ACT AERS Inhale 2 puffs into the lungs daily.    No facility-administered encounter medications on file as of 06/09/2020.    Patient Active Problem List   Diagnosis Date Noted  . Acute hypoxemic respiratory failure ([place]) 05/25/2020  . Pulmonary embolus ([place]) 05/25/2020  . SIRS (systemic inflammatory response syndrome) ([place]) 05/25/2020  . Elevated troponin 05/25/2020  . Weakness of both lower extremities   . FUO (fever of unknown origin)   . Sepsis due to pneumonia ([place]) 05/09/2020  . Post-COVID-19 syndrome manifesting as chronic cough 05/09/2020  . COPD without exacerbation ([place]) 05/09/2020  . Essential hypertension 04/26/2020  . Acute hypoxemic respiratory failure due to COVID-19 ([place]) 04/03/2020  . Acute respiratory failure with hypoxia ([place]) 03/28/2020  . Physical deconditioning 03/26/2020  . Normocytic anemia 03/26/2020  . Severe sepsis ([place]) 03/25/2020  . Multifocal pneumonia 03/17/2020  . COPD with acute exacerbation ([place]) 03/17/2020  . Hypoxia 03/17/2020  . CAD (coronary artery disease) 03/17/2020  . Thrombocytopenia ([place]) 03/08/2020  . Stage 3b chronic kidney disease ([place]) 03/08/2020  . Pneumonia due to COVID-19 virus 02/29/2020  .  COVID-19 02/29/2020  . Closed nondisplaced fracture of shaft of third metacarpal bone of left hand 03/04/2019  . Wound infection after surgery 01/01/2018  . Medication monitoring encounter 01/01/2018  . Sepsis ([place]) 12/10/2017  . HNP (herniated nucleus pulposus), lumbar 10/07/2017  . Lumbar herniated disc 10/07/2017  . Psoriasis of scalp 03/27/2016  . Rheumatoid arthritis ([place]) 03/25/2013  . GI bleeding 02/20/2012  . Dyspnea on exertion 09/08/2010  . COPD (chronic obstructive pulmonary disease) ([place]) 08/04/2010  . Bruising 08/04/2010  . TINNITUS 12/16/2009  . Dizziness and giddiness 12/16/2009  . NECK SPRAIN AND STRAIN 10/21/2009  . LUMBAR SPRAIN AND STRAIN 10/21/2009  . Hyperlipidemia 06/10/2009  . Coronary artery disease of native artery of native heart with stable angina pectoris ([place]) 06/10/2009  . GERD 06/10/2009  . BARRETTS ESOPHAGUS 06/10/2009  . CONCUSSION WITH LOC OF 30 MINUTES OR LESS 02/28/2009  . NEPHROLITHIASIS 07/01/2007  . CONTACT DERMATITIS 12/12/2006  . BPH (benign prostatic hyperplasia) 11/04/2006  . COLONIC POLYPS  10/21/2003  . DIVERTICULOSIS, COLON 10/21/2003    Conditions to be addressed/monitored:HTN, COPD and post COVID syndrome  Care Plan : RNCM: Cardiovascular Disease- HTN/hypotension, HLD, CAD  Updates made by Dimitri Ped, RN since 06/09/2020 12:00 AM    Problem: Lack of long term management of Cardiovascular disease   Priority: Medium    Long-Range Goal: Effective long term self management of Cardiovascular disease(HTN/hypotension, CAD, HLD)   Start Date: 05/24/2020  Expected End Date: 11/23/2020  This Visit's Progress: On track  Recent Progress: On track  Priority: Medium  Note:   Current Barriers:  Marland Kitchen Knowledge deficits related to self health management of cardiovascular disease:HTN/hypotension,CAD and HLD . Knowledge Deficits related to HTN/hypotension, CAD and HLD . Chronic Disease Management support and education needs related to  HTN/hypotension, CAD and HLD . Difficulty obtaining medications  . Unable to perform ADLs independently . Unable to perform IADLs independently . Pt hospitalized 05/25/20-06/02/20 with post COVID syndrome, sepsis and PE. Pt had video visit 06/08/20 and Palliative care visit on 06/03/20  Pt continues to have [facility] for nursing, PT and OT.  Reports his B/P has been stable since returning home.  Denies any falls since returning home Nurse Case Manager Clinical Goal(s):   patient will take all medications exactly as prescribed and will call provider for medication related questions  patient will verbalize understanding of cardiovascular symptoms and when to call doctor  patient will verbalize understanding of plan for HTN/hypotension, CAD and HLD  patient will work with primary care provider and CCM team to address needs related to HTN/hypotension, CAD and HLD  patient will attend all scheduled medical appointments: Dr.Ellison  06/27/20 Pulmonary  patient will demonstrate improved adherence to prescribed treatment plan for HTN/hypotension, CAD and HLD as evidenced by improved B/P in range, adherence to medications and  reports  of improvement   patient will work with CM team pharmacist to assist with pharmacy assistance and adherence Interventions:  . Collaboration with [name], MD regarding development and update of comprehensive plan of care as evidenced by provider attestation and co-signature . Inter-disciplinary care team collaboration (see longitudinal plan of care) . Basic overview and discussion of HTN/hypotension, CAD and HLD . Evaluation of current treatment plan related to HTN/hypotension, CAD and HLD and patient's adherence to plan as established by provider. . Reinforced education to patient re: "HTN/hypotension, CAD and HLD . Reviewed medications with patient and reinforced adherence  . Reinforced to monitor blood pressure daily and record, calling provider for findings outside  established parameters.  . Reviewed scheduled/upcoming provider appointments including:  . Social Work referral for resources for possible VA benefits for personal care service and caregiver stress-declined to work with LCSW at this time . Pharmacy referral for pharmacy assistance pt is active with CCM pharmacist-CCM Pharmacist outreached on 06/08/20 . Discussed plans with patient for ongoing care management follow up and provided patient with direct contact information for care management team Patient Goals/Self-Care Activities: -check blood pressure 3 times per week - choose a place to take my blood pressure (home, clinic or office, retail store) - write blood pressure results in a log or diary  -obtain a blood pressure monitor Follow Up Plan: Telephone follow up appointment with care management team member scheduled for: 07/07/20 at 10 AM The patient has been provided with contact information for the care management team and has been advised to call with any health related questions or concerns.      Care Plan : RNCM: Post  COVID/COPD  Updates made by Dimitri Ped, RN since 06/09/2020 12:00 AM    Problem: Lack of long term self management plan of post COVID/COPD   Priority: High    Long-Range Goal: Long term effective self mangement of post COVID/COPD   Start Date: 05/24/2020  Expected End Date: 11/23/2020  This Visit's Progress: Not on track  Recent Progress: On track  Priority: High  Note:   Current Barriers:   Knowledge deficit related to basic COPD/post COVID self care/management  Knowledge deficit related to basic understanding of how to use inhalers and how inhaled medications work  Knowledge deficit related to importance of energy conservation  Unable to perform ADLs independently  Unable to perform IADLs independently   Pt hospitalized 05/25/20-06/02/20 with post COVID syndrome, sepsis and PE. Pt had video visit 06/08/20 and Palliative care visit on 06/03/20  Pt continues to  have [facility] for nursing, PT and OT.  States he is not able to walk due to weakness and breathing.  States he can stand to transfer to wheelchair.  States he is wearing O2 at 3 l/min most of the time.  States his O2 sat is staying in the 90's but he gets short of breath with any exertion.  Denies any fevers.  States he had a video visit with Dr. Sarajane Jews yesterday for a sore mouth and he gave him Nystatin to take for the thrush.  States he completed his new Advanced Directives  forms with an attorney yesterday  Nurse Case Manager Clinical Goal(s):  patient will report using inhalers as prescribed including rinsing mouth after use  patient will report utilizing pursed lip breathing for shortness of breath  patient will be able to verbalize understanding of COPD action plan and when to seek appropriate levels of medical care  patient will engage in lite exercise as tolerated to build/regain stamina and strength and reduce shortness of breath through activity tolerance   patient will verbalize basic understanding of COVID-19 impact on individual health and self health management as evidenced by verbalization of basic understanding of COVID-19 as a viral disease,  Interventions:  . Collaboration with [name], MD regarding development and update of comprehensive plan of care as evidenced by provider attestation and co-signature-  . Inter-disciplinary care team collaboration (see longitudinal plan of care)  Reinforced COPD education on self care/management/and exacerbation prevention   Reviewed COPD action plan and reinforced importance of daily self assessment  Reinforced instructions on pursed lip breathing and utilized returned demonstration as teach back  Reinforced instruction on COVID-19 impact on individual health and self health management as evidenced by verbalization of basic understanding of COVID-19 as a viral disease,   Collaborated with CCM pharmacist about concerns with cost of  inhalers-spoke with pt 06/08/20  Instructed on thrush and use of Nystain swish   Reviewed scheduled/upcoming provider appointments including: Pulmonary Dr. Loanne Drilling 06/27/20  Patient Goals/Self-Care Activities - Take medications as prescribed including inhalers - Practice and use pursed lip breathing for shortness of breath recovery and prevention - Self assess COPD action plan zone and make appointment with provider if you have been in the yellow zone for 48 hours without   improvement. - Engage in lite exercise as tolerated 3-5 days a week - Utilize infection prevention strategies to reduce risk of respiratory infection   - develop a rescue plan - follow rescue plan if symptoms flare-up - keep follow-up appointments Follow Up Plan: Telephone follow up appointment with care management team member scheduled  for: 07/07/20 at 10 AM The patient has been provided with contact information for the care management team and has been advised to call with any health related questions or concerns.       Plan:Telephone follow up appointment with care management team member scheduled for:  07/07/20 and The patient has been provided with contact information for the care management team and has been advised to call with any health related questions or concerns.  Peter Garter RN, [facility], CDE Care Management Coordinator [facility] [phone], Mobile [phone]

## 2020-06-09 NOTE — Patient Instructions (Signed)
Visit Information  PATIENT GOALS: Goals Addressed              This Visit's Progress   .  [place] My Quality of Life-stand and walk again (pt-stated)   Not on track     Timeframe:  Long-Range Goal Priority:  Medium Start Date:      05/24/20                       Expected End Date:     11/23/20                  Follow Up Date 07/07/20    - check out options for in-home help, long-term care or hospice - discuss my treatment options with the doctor or nurse - do one enjoyable thing every day  -do exercises that PT recommends   Why is this important?    Having a long-term illness can be scary.   It can also be stressful for you and your caregiver.   These steps may help.    Notes:     .  Track and Manage My Blood Pressure-Hypertension/Hypotension   On track     Timeframe:  Long-Range Goal Priority:  Medium Start Date:   05/24/20                          Expected End Date:      11/23/20                 Follow Up Date 07/07/20    - check blood pressure 3 times per week - choose a place to take my blood pressure (home, clinic or office, retail store) - write blood pressure results in a log or diary  -obtain a blood pressure monitor   Why is this important?    You won't feel high blood pressure, but it can still hurt your blood vessels.   High blood pressure can cause heart or kidney problems. It can also cause a stroke.   Making lifestyle changes like losing a little weight or eating less salt will help.   Checking your blood pressure at home and at different times of the day can help to control blood pressure.   If the doctor prescribes medicine remember to take it the way the doctor ordered.   Call the office if you cannot afford the medicine or if there are questions about it.     Notes:     .  Track and Manage My Symptoms-COPD/post COVID   On track     Timeframe:  Long-Range Goal Priority:  High Start Date:      05/24/20                       Expected End  Date:     11/23/20                  Follow Up Date 07/07/20    - develop a rescue plan - follow rescue plan if symptoms flare-up - keep follow-up appointments   -Take medications as prescribed including inhalers - Practice and use pursed lip breathing for shortness of breath recovery and prevention - Self assess COPD action plan zone and make appointment with provider if you have been in the yellow zone for 48 hours without   improvement. - Engage in lite exercise as tolerated 3-5 days a week - Utilize infection prevention  strategies to reduce risk of respiratory infection  Why is this important?    Tracking your symptoms and other information about your health helps your doctor plan your care.   Write down the symptoms, the time of day, what you were doing and what medicine you are taking.   You will soon learn how to manage your symptoms.     Notes:       Oral Thrush, Adult Oral thrush is an infection in your mouth and throat and on your tongue. It causes white patches to form in your mouth and on your tongue. Many cases of thrush are mild. But, sometimes, thrush can be serious. People who have a weak body defense system (immune system) or other diseases can be affected more. What are the causes? This condition is caused by a type of fungus called yeast. The fungus is normally present in small amounts in the mouth and nose. If a person has a long-term illness or a weak body defense system, the fungus can grow and spread quickly. This causes thrush. What increases the risk? You are more likely to develop this condition if:  You have a weak body defense system.  You are an older adult.  You have diabetes, cancer, or HIV.  You have a dry mouth.  You are pregnant or breastfeeding.  You do not take good care of your teeth. This risk is greater for people who have false teeth (dentures).  You use antibiotic or steroid medicines. What are the signs or symptoms? Symptoms of this  condition include:  A burning feeling in the mouth and throat.  White patches that stick to the mouth and tongue.  A bad taste in the mouth or trouble tasting foods.  A feeling like you have cotton in your mouth.  Pain when you eat and swallow.  Not wanting to eat as much as usual.  Cracking at the corners of the mouth.   How is this treated? This condition is treated with medicines called antifungals. These medicines prevent a fungus from growing. The medicines are either put right on the area (topical) or swallowed (oral). Your doctor will also treat other problems that you may have, such as diabetes or HIV. Follow these instructions at home: Medicines  Take or use over-the-counter and prescription medicines only as told by your doctor.  Ask your doctor about an over-the-counter medicine called gentian violet. Helping with pain and soreness To lessen your pain:  Drink cold liquids, like water and iced tea.  Eat frozen ice pops or frozen juices.  Eat foods that are easy to swallow, like gelatin and ice cream.  Drink from a straw if you have too much pain in your mouth.   General instructions  Eat plain yogurt that has live cultures in it. Read the label to make sure that there are live cultures in your yogurt.  If you wear false teeth: ? Take them out before you go to bed. ? Brush them well. ? Soak them in a cleaner.  Rinse your mouth with warm salt-water many times a day. To make the salt-water mixture, dissolve -1 teaspoon (3-6 g) of salt in 1 cup (237 mL) of warm water. Contact a doctor if:  Your problems do not get better within 7 days of treatment.  Your infection is spreading. This may show as white areas on the skin outside of your mouth.  You are nursing your baby and you have redness and pain in the nipples. Summary  Oral thrush is an infection in your mouth and throat. It is caused by a fungus.  You are more likely to get this condition if you have a  weak body defense system. Diseases like diabetes, cancer, or HIV also add to your risk.  This condition is treated with medicines called antifungals.  Contact a doctor if you do not get better within 7 days of starting treatment. This information is not intended to replace advice given to you by your health care provider. Make sure you discuss any questions you have with your health care provider. Document Revised: 11/28/2018 Document Reviewed: 11/28/2018 Elsevier Patient Education  2021 [place].   Patient verbalizes understanding of instructions provided today and agrees to view in [place].   Telephone follow up appointment with care management team member scheduled for: 07/07/20  Peter Garter RN, [facility], CDE Care Management Coordinator [place] [phone], Mobile [phone]

## 2020-06-09 NOTE — Telephone Encounter (Signed)
Beth w/[facility] is calling in stating that she went out to see the pt today and the pt is worse today blood sugars are running high today between 225 to 350.  Pt would like to know what sliding scales they should be using for the short acting insulin. Resp 30 stat 90-91% on 3 liters crackles on the left lobes and demenus on the R lobe an little be more SOB.  And pt had a 10 min episode of R sided chest pain and pt stated that he is a lot more weaker today.  Pt is still coughing up green sputum.

## 2020-06-09 NOTE — Telephone Encounter (Signed)
Pharmacy Transitions of Care Follow-up Telephone Call  Date of discharge: 06/02/2020    How have you been since you were released from the hospital? As good as expected   Medication changes made at discharge:  - START: Diabetic testing supplies, Xarelto, Humalog, Lantus   - CHANGED: Metorprolol Medication changes verified by the patient? Yes   Medication Accessibility:  Was the patient provided with refills on discharged medications? No   Have all prescriptions been transferred from [facility] to home pharmacy? NA . Is the patient able to afford medications? Yes   Medication Review:  RIVAROXABAN (XARELTO)  Rivaroxaban 15 mg BID initiated on 06/03/20. Will switch to 20 mg daily after 21 days (May 12th).   - Discussed importance of taking medication with food and around the same time everyday  - Reviewed potential DDIs with patient  - Advised patient of medications to avoid (NSAIDs, ASA)  - Educated that Tylenol (acetaminophen) will be the preferred analgesic to prevent risk of bleeding  - Emphasized importance of monitoring for signs and symptoms of bleeding (abnormal bruising, prolonged bleeding, nose bleeds, bleeding from gums, discolored urine, black tarry stools)  - Advised patient to alert all providers of anticoagulation therapy prior to starting a new medication or having a procedure    Follow-up Appointments:  PCP Hospital f/u appt confirmed? Yes  Specialist Hospital f/u appt confirmed?Yes  If their condition worsens, is the pt aware to call PCP or go to the Emergency Dept.?Yes  Final Patient Assessment: Patient reports he is doing as well as can be expected.  Reviewed new medications including Xarelto and insulin.  Reports blood sugars are doing well.  He has all follow up appointments scheduled.  Reports he is having no issues with medications.

## 2020-06-10 NOTE — Telephone Encounter (Signed)
They can adjust the previous sliding to give 4 units, 6 units, or 8 units. Then change anything above 300 to 10 units

## 2020-06-10 NOTE — Telephone Encounter (Signed)
Beth w/[facility] returned the call back.

## 2020-06-10 NOTE — Telephone Encounter (Signed)
Spoke with Beth at [facility], advised of Dr Sarajane Jews recommendations, stated that she will call the office back with an update regarding pt blood sugars

## 2020-06-10 NOTE — Telephone Encounter (Signed)
LVM for Beth to return call my call in office reguarding pt blood sugar.

## 2020-06-12 ENCOUNTER — Other Ambulatory Visit: Payer: Self-pay

## 2020-06-12 ENCOUNTER — Encounter (HOSPITAL_COMMUNITY): Payer: Self-pay

## 2020-06-12 ENCOUNTER — Emergency Department (HOSPITAL_COMMUNITY): Payer: Medicare Other

## 2020-06-12 ENCOUNTER — Inpatient Hospital Stay (HOSPITAL_COMMUNITY)
Admission: EM | Admit: 2020-06-12 | Discharge: 2020-07-06 | DRG: 177 | Disposition: E | Payer: Medicare Other | Attending: Internal Medicine | Admitting: Internal Medicine

## 2020-06-12 DIAGNOSIS — J849 Interstitial pulmonary disease, unspecified: Secondary | ICD-10-CM | POA: Diagnosis present

## 2020-06-12 DIAGNOSIS — J1282 Pneumonia due to coronavirus disease 2019: Secondary | ICD-10-CM | POA: Diagnosis present

## 2020-06-12 DIAGNOSIS — J189 Pneumonia, unspecified organism: Secondary | ICD-10-CM | POA: Diagnosis not present

## 2020-06-12 DIAGNOSIS — R509 Fever, unspecified: Secondary | ICD-10-CM | POA: Diagnosis not present

## 2020-06-12 DIAGNOSIS — Z87891 Personal history of nicotine dependence: Secondary | ICD-10-CM

## 2020-06-12 DIAGNOSIS — I2692 Saddle embolus of pulmonary artery without acute cor pulmonale: Secondary | ICD-10-CM | POA: Diagnosis not present

## 2020-06-12 DIAGNOSIS — E785 Hyperlipidemia, unspecified: Secondary | ICD-10-CM | POA: Diagnosis present

## 2020-06-12 DIAGNOSIS — I252 Old myocardial infarction: Secondary | ICD-10-CM | POA: Diagnosis not present

## 2020-06-12 DIAGNOSIS — Z794 Long term (current) use of insulin: Secondary | ICD-10-CM

## 2020-06-12 DIAGNOSIS — J80 Acute respiratory distress syndrome: Secondary | ICD-10-CM | POA: Diagnosis not present

## 2020-06-12 DIAGNOSIS — U071 COVID-19: Secondary | ICD-10-CM | POA: Diagnosis not present

## 2020-06-12 DIAGNOSIS — D6959 Other secondary thrombocytopenia: Secondary | ICD-10-CM | POA: Diagnosis not present

## 2020-06-12 DIAGNOSIS — I110 Hypertensive heart disease with heart failure: Secondary | ICD-10-CM | POA: Diagnosis not present

## 2020-06-12 DIAGNOSIS — I2699 Other pulmonary embolism without acute cor pulmonale: Secondary | ICD-10-CM | POA: Diagnosis not present

## 2020-06-12 DIAGNOSIS — R6510 Systemic inflammatory response syndrome (SIRS) of non-infectious origin without acute organ dysfunction: Secondary | ICD-10-CM | POA: Diagnosis not present

## 2020-06-12 DIAGNOSIS — Z7952 Long term (current) use of systemic steroids: Secondary | ICD-10-CM | POA: Diagnosis not present

## 2020-06-12 DIAGNOSIS — I2510 Atherosclerotic heart disease of native coronary artery without angina pectoris: Secondary | ICD-10-CM | POA: Diagnosis present

## 2020-06-12 DIAGNOSIS — B370 Candidal stomatitis: Secondary | ICD-10-CM | POA: Diagnosis not present

## 2020-06-12 DIAGNOSIS — K219 Gastro-esophageal reflux disease without esophagitis: Secondary | ICD-10-CM | POA: Diagnosis present

## 2020-06-12 DIAGNOSIS — J9621 Acute and chronic respiratory failure with hypoxia: Secondary | ICD-10-CM | POA: Diagnosis not present

## 2020-06-12 DIAGNOSIS — I499 Cardiac arrhythmia, unspecified: Secondary | ICD-10-CM | POA: Diagnosis not present

## 2020-06-12 DIAGNOSIS — D849 Immunodeficiency, unspecified: Secondary | ICD-10-CM | POA: Diagnosis present

## 2020-06-12 DIAGNOSIS — J449 Chronic obstructive pulmonary disease, unspecified: Secondary | ICD-10-CM | POA: Diagnosis present

## 2020-06-12 DIAGNOSIS — L89159 Pressure ulcer of sacral region, unspecified stage: Secondary | ICD-10-CM | POA: Diagnosis present

## 2020-06-12 DIAGNOSIS — R0902 Hypoxemia: Secondary | ICD-10-CM | POA: Diagnosis not present

## 2020-06-12 DIAGNOSIS — Z515 Encounter for palliative care: Secondary | ICD-10-CM | POA: Diagnosis not present

## 2020-06-12 DIAGNOSIS — I10 Essential (primary) hypertension: Secondary | ICD-10-CM | POA: Diagnosis not present

## 2020-06-12 DIAGNOSIS — Z79899 Other long term (current) drug therapy: Secondary | ICD-10-CM

## 2020-06-12 DIAGNOSIS — Z0184 Encounter for antibody response examination: Secondary | ICD-10-CM

## 2020-06-12 DIAGNOSIS — Z66 Do not resuscitate: Secondary | ICD-10-CM | POA: Diagnosis not present

## 2020-06-12 DIAGNOSIS — E876 Hypokalemia: Secondary | ICD-10-CM | POA: Diagnosis present

## 2020-06-12 DIAGNOSIS — J439 Emphysema, unspecified: Secondary | ICD-10-CM | POA: Diagnosis not present

## 2020-06-12 DIAGNOSIS — R0602 Shortness of breath: Secondary | ICD-10-CM | POA: Diagnosis not present

## 2020-06-12 DIAGNOSIS — U099 Post covid-19 condition, unspecified: Secondary | ICD-10-CM | POA: Diagnosis present

## 2020-06-12 DIAGNOSIS — N400 Enlarged prostate without lower urinary tract symptoms: Secondary | ICD-10-CM | POA: Diagnosis present

## 2020-06-12 DIAGNOSIS — R000 Tachycardia, unspecified: Secondary | ICD-10-CM | POA: Diagnosis not present

## 2020-06-12 DIAGNOSIS — M069 Rheumatoid arthritis, unspecified: Secondary | ICD-10-CM | POA: Diagnosis present

## 2020-06-12 DIAGNOSIS — R0689 Other abnormalities of breathing: Secondary | ICD-10-CM | POA: Diagnosis not present

## 2020-06-12 DIAGNOSIS — I5032 Chronic diastolic (congestive) heart failure: Secondary | ICD-10-CM | POA: Diagnosis present

## 2020-06-12 DIAGNOSIS — Z9981 Dependence on supplemental oxygen: Secondary | ICD-10-CM

## 2020-06-12 DIAGNOSIS — Z8673 Personal history of transient ischemic attack (TIA), and cerebral infarction without residual deficits: Secondary | ICD-10-CM

## 2020-06-12 DIAGNOSIS — J9601 Acute respiratory failure with hypoxia: Secondary | ICD-10-CM | POA: Diagnosis not present

## 2020-06-12 DIAGNOSIS — J479 Bronchiectasis, uncomplicated: Secondary | ICD-10-CM | POA: Diagnosis not present

## 2020-06-12 DIAGNOSIS — Z7189 Other specified counseling: Secondary | ICD-10-CM

## 2020-06-12 DIAGNOSIS — Z7951 Long term (current) use of inhaled steroids: Secondary | ICD-10-CM

## 2020-06-12 DIAGNOSIS — A419 Sepsis, unspecified organism: Secondary | ICD-10-CM | POA: Diagnosis not present

## 2020-06-12 DIAGNOSIS — E119 Type 2 diabetes mellitus without complications: Secondary | ICD-10-CM | POA: Diagnosis present

## 2020-06-12 DIAGNOSIS — Z7901 Long term (current) use of anticoagulants: Secondary | ICD-10-CM

## 2020-06-12 DIAGNOSIS — R053 Chronic cough: Secondary | ICD-10-CM | POA: Diagnosis present

## 2020-06-12 DIAGNOSIS — Z86711 Personal history of pulmonary embolism: Secondary | ICD-10-CM

## 2020-06-12 DIAGNOSIS — Z955 Presence of coronary angioplasty implant and graft: Secondary | ICD-10-CM

## 2020-06-12 LAB — PROCALCITONIN: Procalcitonin: <0.1 ng/mL

## 2020-06-12 LAB — URINALYSIS, ROUTINE W REFLEX MICROSCOPIC
Bilirubin Urine: NEGATIVE
Glucose, UA: 50 mg/dL — AB
Hgb urine dipstick: NEGATIVE
Ketones, ur: NEGATIVE mg/dL
Leukocytes,Ua: NEGATIVE
Nitrite: NEGATIVE
Protein, ur: NEGATIVE mg/dL
Specific Gravity, Urine: 1.024 (ref 1.005–1.030)
pH: 5 (ref 5.0–8.0)

## 2020-06-12 LAB — CBC WITH DIFFERENTIAL/PLATELET
Abs Immature Granulocytes: 0.15 10*3/uL — ABNORMAL HIGH (ref 0.00–0.07)
Basophils Absolute: 0 10*3/uL (ref 0.0–0.1)
Basophils Relative: 0 %
Eosinophils Absolute: 0 10*3/uL (ref 0.0–0.5)
Eosinophils Relative: 0 %
HCT: 47.3 % (ref 39.0–52.0)
Hemoglobin: 14.9 g/dL (ref 13.0–17.0)
Immature Granulocytes: 2 %
Lymphocytes Relative: 3 %
Lymphs Abs: 0.3 10*3/uL — ABNORMAL LOW (ref 0.7–4.0)
MCH: 28.7 pg (ref 26.0–34.0)
MCHC: 31.5 g/dL (ref 30.0–36.0)
MCV: 91.1 fL (ref 80.0–100.0)
Monocytes Absolute: 0.2 10*3/uL (ref 0.1–1.0)
Monocytes Relative: 2 %
Neutro Abs: 8.9 10*3/uL — ABNORMAL HIGH (ref 1.7–7.7)
Neutrophils Relative %: 93 %
Platelets: UNDETERMINED 10*3/uL (ref 150–400)
RBC: 5.19 MIL/uL (ref 4.22–5.81)
RDW: 20.1 % — ABNORMAL HIGH (ref 11.5–15.5)
WBC: 9.6 10*3/uL (ref 4.0–10.5)
nRBC: 0 % (ref 0.0–0.2)

## 2020-06-12 LAB — RESP PANEL BY RT-PCR (FLU A&B, COVID) ARPGX2
Influenza A by PCR: NEGATIVE
Influenza B by PCR: NEGATIVE
SARS Coronavirus 2 by RT PCR: POSITIVE — AB

## 2020-06-12 LAB — APTT: aPTT: 30 seconds (ref 24–36)

## 2020-06-12 LAB — TROPONIN I (HIGH SENSITIVITY)
Troponin I (High Sensitivity): 217 ng/L (ref <18)
Troponin I (High Sensitivity): 202 ng/L (ref <18)

## 2020-06-12 LAB — D-DIMER, QUANTITATIVE: D-Dimer, Quant: 0.78 ug/mL-FEU — ABNORMAL HIGH (ref 0.00–0.50)

## 2020-06-12 LAB — I-STAT ARTERIAL BLOOD GAS, ED
Acid-base deficit: 3 mmol/L — ABNORMAL HIGH (ref 0.0–2.0)
Bicarbonate: 20.2 mmol/L (ref 20.0–28.0)
Calcium, Ion: 1.17 mmol/L (ref 1.15–1.40)
HCT: 35 % — ABNORMAL LOW (ref 39.0–52.0)
Hemoglobin: 11.9 g/dL — ABNORMAL LOW (ref 13.0–17.0)
O2 Saturation: 98 %
Patient temperature: 98.5
Potassium: 4.3 mmol/L (ref 3.5–5.1)
Sodium: 132 mmol/L — ABNORMAL LOW (ref 135–145)
TCO2: 21 mmol/L — ABNORMAL LOW (ref 22–32)
pCO2 arterial: 29.9 mmHg — ABNORMAL LOW (ref 32.0–48.0)
pH, Arterial: 7.439 (ref 7.350–7.450)
pO2, Arterial: 102 mmHg (ref 83.0–108.0)

## 2020-06-12 LAB — GLUCOSE, CAPILLARY
Glucose-Capillary: 206 mg/dL — ABNORMAL HIGH (ref 70–99)
Glucose-Capillary: 176 mg/dL — ABNORMAL HIGH (ref 70–99)
Glucose-Capillary: 239 mg/dL — ABNORMAL HIGH (ref 70–99)

## 2020-06-12 LAB — COMPREHENSIVE METABOLIC PANEL
ALT: 61 U/L — ABNORMAL HIGH (ref 0–44)
AST: 53 U/L — ABNORMAL HIGH (ref 15–41)
Albumin: 2.2 g/dL — ABNORMAL LOW (ref 3.5–5.0)
Alkaline Phosphatase: 99 U/L (ref 38–126)
Anion gap: 8 (ref 5–15)
BUN: 32 mg/dL — ABNORMAL HIGH (ref 8–23)
CO2: 23 mmol/L (ref 22–32)
Calcium: 9.6 mg/dL (ref 8.9–10.3)
Chloride: 101 mmol/L (ref 98–111)
Creatinine, Ser: 0.98 mg/dL (ref 0.61–1.24)
GFR, Estimated: >60 mL/min (ref >60)
Glucose, Bld: 233 mg/dL — ABNORMAL HIGH (ref 70–99)
Potassium: 4.6 mmol/L (ref 3.5–5.1)
Sodium: 132 mmol/L — ABNORMAL LOW (ref 135–145)
Total Bilirubin: 0.3 mg/dL (ref 0.3–1.2)
Total Protein: 5.4 g/dL — ABNORMAL LOW (ref 6.5–8.1)

## 2020-06-12 LAB — LACTIC ACID, PLASMA
Lactic Acid, Venous: 4.5 mmol/L (ref 0.5–1.9)
Lactic Acid, Venous: 2.8 mmol/L (ref 0.5–1.9)
Lactic Acid, Venous: 2.7 mmol/L (ref 0.5–1.9)
Lactic Acid, Venous: 3.2 mmol/L (ref 0.5–1.9)

## 2020-06-12 LAB — PROTIME-INR
INR: 1.7 — ABNORMAL HIGH (ref 0.8–1.2)
Prothrombin Time: 20.1 seconds — ABNORMAL HIGH (ref 11.4–15.2)

## 2020-06-12 LAB — C-REACTIVE PROTEIN: CRP: 6.1 mg/dL — ABNORMAL HIGH (ref <1.0)

## 2020-06-12 LAB — FERRITIN: Ferritin: 1855 ng/mL — ABNORMAL HIGH (ref 24–336)

## 2020-06-12 LAB — SAR COV2 SEROLOGY (COVID19)AB(IGG),IA: SARS-CoV-2 Ab, IgG: REACTIVE — AB

## 2020-06-12 LAB — LACTATE DEHYDROGENASE: LDH: 509 U/L — ABNORMAL HIGH (ref 98–192)

## 2020-06-12 LAB — FIBRINOGEN: Fibrinogen: 659 mg/dL — ABNORMAL HIGH (ref 210–475)

## 2020-06-12 MED ORDER — FUROSEMIDE 10 MG/ML IJ SOLN
40.0000 mg | Freq: Once | INTRAMUSCULAR | Status: AC
  Administered 2020-06-12: 40 mg via INTRAVENOUS
  Filled 2020-06-12: qty 4

## 2020-06-12 MED ORDER — BOOST PLUS PO LIQD
237.0000 mL | Freq: Three times a day (TID) | ORAL | Status: DC
  Administered 2020-06-12: 237 mL via ORAL
  Filled 2020-06-12 (×4): qty 237

## 2020-06-12 MED ORDER — GUAIFENESIN-DM 100-10 MG/5ML PO SYRP
10.0000 mL | ORAL_SOLUTION | ORAL | Status: DC | PRN
  Administered 2020-06-13: 10 mL via ORAL
  Filled 2020-06-12: qty 10

## 2020-06-12 MED ORDER — LIDOCAINE VISCOUS HCL 2 % MT SOLN
15.0000 mL | Freq: Once | OROMUCOSAL | Status: AC
  Administered 2020-06-12: 15 mL via OROMUCOSAL
  Filled 2020-06-12: qty 15

## 2020-06-12 MED ORDER — SODIUM CHLORIDE 0.9 % IV BOLUS
500.0000 mL | Freq: Once | INTRAVENOUS | Status: AC
  Administered 2020-06-12: 500 mL via INTRAVENOUS

## 2020-06-12 MED ORDER — FLUCONAZOLE IN SODIUM CHLORIDE 200-0.9 MG/100ML-% IV SOLN
200.0000 mg | INTRAVENOUS | Status: DC
  Administered 2020-06-12 – 2020-06-13 (×2): 200 mg via INTRAVENOUS
  Filled 2020-06-12 (×3): qty 100

## 2020-06-12 MED ORDER — SODIUM CHLORIDE 0.9% FLUSH
3.0000 mL | Freq: Two times a day (BID) | INTRAVENOUS | Status: DC
  Administered 2020-06-12 – 2020-06-16 (×9): 3 mL via INTRAVENOUS

## 2020-06-12 MED ORDER — SODIUM CHLORIDE 0.9% FLUSH: 3.0000 mL | INTRAVENOUS | Status: DC | PRN

## 2020-06-12 MED ORDER — LACTATED RINGERS IV SOLN: INTRAVENOUS | Status: DC

## 2020-06-12 MED ORDER — PANTOPRAZOLE SODIUM 40 MG PO TBEC
40.0000 mg | DELAYED_RELEASE_TABLET | Freq: Every day | ORAL | Status: DC
  Administered 2020-06-12 – 2020-06-16 (×5): 40 mg via ORAL
  Filled 2020-06-12 (×5): qty 1

## 2020-06-12 MED ORDER — HYDROCORTISONE NA SUCCINATE PF 100 MG IJ SOLR
50.0000 mg | Freq: Four times a day (QID) | INTRAMUSCULAR | Status: DC
  Administered 2020-06-12 – 2020-06-13 (×4): 50 mg via INTRAVENOUS
  Filled 2020-06-12 (×4): qty 2

## 2020-06-12 MED ORDER — TAMSULOSIN HCL 0.4 MG PO CAPS
0.4000 mg | ORAL_CAPSULE | Freq: Every day | ORAL | Status: DC
  Administered 2020-06-12 – 2020-06-16 (×5): 0.4 mg via ORAL
  Filled 2020-06-12 (×6): qty 1

## 2020-06-12 MED ORDER — METOPROLOL TARTRATE 50 MG PO TABS
50.0000 mg | ORAL_TABLET | Freq: Two times a day (BID) | ORAL | Status: DC
  Administered 2020-06-12 – 2020-06-16 (×9): 50 mg via ORAL
  Filled 2020-06-12 (×10): qty 1

## 2020-06-12 MED ORDER — ALBUTEROL SULFATE HFA 108 (90 BASE) MCG/ACT IN AERS
2.0000 | INHALATION_SPRAY | RESPIRATORY_TRACT | Status: DC | PRN
  Administered 2020-06-13: 2 via RESPIRATORY_TRACT
  Filled 2020-06-12: qty 6.7

## 2020-06-12 MED ORDER — FLEET ENEMA 7-19 GM/118ML RE ENEM: 1.0000 | ENEMA | Freq: Once | RECTAL | Status: DC | PRN

## 2020-06-12 MED ORDER — ONDANSETRON HCL 4 MG/2ML IJ SOLN: 4.0000 mg | Freq: Four times a day (QID) | INTRAMUSCULAR | Status: DC | PRN

## 2020-06-12 MED ORDER — SULFAMETHOXAZOLE-TRIMETHOPRIM 800-160 MG PO TABS
1.0000 | ORAL_TABLET | ORAL | Status: DC
  Administered 2020-06-13 – 2020-06-15 (×2): 1 via ORAL
  Filled 2020-06-12 (×2): qty 1

## 2020-06-12 MED ORDER — UMECLIDINIUM BROMIDE 62.5 MCG/INH IN AEPB
1.0000 | INHALATION_SPRAY | Freq: Every day | RESPIRATORY_TRACT | Status: DC
  Administered 2020-06-12: 1 via RESPIRATORY_TRACT
  Filled 2020-06-12: qty 7

## 2020-06-12 MED ORDER — IPRATROPIUM-ALBUTEROL 0.5-2.5 (3) MG/3ML IN SOLN: 3.0000 mL | RESPIRATORY_TRACT | Status: DC | PRN

## 2020-06-12 MED ORDER — ACETAMINOPHEN 325 MG PO TABS
650.0000 mg | ORAL_TABLET | Freq: Four times a day (QID) | ORAL | Status: DC | PRN
  Administered 2020-06-13 (×3): 650 mg via ORAL
  Filled 2020-06-12 (×3): qty 2

## 2020-06-12 MED ORDER — MOMETASONE FURO-FORMOTEROL FUM 200-5 MCG/ACT IN AERO
2.0000 | INHALATION_SPRAY | Freq: Two times a day (BID) | RESPIRATORY_TRACT | Status: DC
  Administered 2020-06-12 – 2020-06-16 (×9): 2 via RESPIRATORY_TRACT
  Filled 2020-06-12: qty 8.8

## 2020-06-12 MED ORDER — BISACODYL 5 MG PO TBEC
5.0000 mg | DELAYED_RELEASE_TABLET | Freq: Every day | ORAL | Status: DC | PRN
  Administered 2020-06-15: 5 mg via ORAL
  Filled 2020-06-12 (×2): qty 1

## 2020-06-12 MED ORDER — HYDROCOD POLST-CPM POLST ER 10-8 MG/5ML PO SUER: 5.0000 mL | Freq: Two times a day (BID) | ORAL | Status: DC | PRN

## 2020-06-12 MED ORDER — POLYETHYLENE GLYCOL 3350 17 G PO PACK: 17.0000 g | PACK | Freq: Every day | ORAL | Status: DC | PRN

## 2020-06-12 MED ORDER — RIVAROXABAN 20 MG PO TABS
20.0000 mg | ORAL_TABLET | Freq: Every day | ORAL | Status: DC
  Filled 2020-06-12: qty 1

## 2020-06-12 MED ORDER — MELATONIN 5 MG PO TABS
5.0000 mg | ORAL_TABLET | Freq: Every evening | ORAL | Status: DC | PRN
  Administered 2020-06-15: 5 mg via ORAL
  Filled 2020-06-12 (×2): qty 1

## 2020-06-12 MED ORDER — OXYCODONE HCL 5 MG PO TABS: 5.0000 mg | ORAL_TABLET | ORAL | Status: DC | PRN

## 2020-06-12 MED ORDER — DOCUSATE SODIUM 100 MG PO CAPS
100.0000 mg | ORAL_CAPSULE | Freq: Two times a day (BID) | ORAL | Status: DC
  Administered 2020-06-12 – 2020-06-16 (×8): 100 mg via ORAL
  Filled 2020-06-12 (×9): qty 1

## 2020-06-12 MED ORDER — SODIUM CHLORIDE 0.9 % IV SOLN: 250.0000 mL | INTRAVENOUS | Status: DC | PRN

## 2020-06-12 MED ORDER — INSULIN GLARGINE 100 UNIT/ML ~~LOC~~ SOLN
15.0000 [IU] | Freq: Every day | SUBCUTANEOUS | Status: DC
  Administered 2020-06-12 – 2020-06-15 (×4): 15 [IU] via SUBCUTANEOUS
  Filled 2020-06-12 (×5): qty 0.15

## 2020-06-12 MED ORDER — ASCORBIC ACID 500 MG PO TABS
500.0000 mg | ORAL_TABLET | Freq: Every day | ORAL | Status: DC
  Administered 2020-06-12 – 2020-06-16 (×5): 500 mg via ORAL
  Filled 2020-06-12 (×5): qty 1

## 2020-06-12 MED ORDER — RIVAROXABAN 15 MG PO TABS
15.0000 mg | ORAL_TABLET | Freq: Two times a day (BID) | ORAL | Status: AC
  Administered 2020-06-12 – 2020-06-15 (×8): 15 mg via ORAL
  Filled 2020-06-12 (×8): qty 1

## 2020-06-12 MED ORDER — ZINC SULFATE 220 (50 ZN) MG PO CAPS
220.0000 mg | ORAL_CAPSULE | Freq: Every day | ORAL | Status: DC
  Administered 2020-06-12 – 2020-06-16 (×5): 220 mg via ORAL
  Filled 2020-06-12 (×5): qty 1

## 2020-06-12 MED ORDER — SIMVASTATIN 20 MG PO TABS
20.0000 mg | ORAL_TABLET | Freq: Every day | ORAL | Status: DC
  Administered 2020-06-12 – 2020-06-16 (×4): 20 mg via ORAL
  Filled 2020-06-12 (×5): qty 1

## 2020-06-12 MED ORDER — ONDANSETRON HCL 4 MG PO TABS: 4.0000 mg | ORAL_TABLET | Freq: Four times a day (QID) | ORAL | Status: DC | PRN

## 2020-06-12 MED ORDER — IOHEXOL 350 MG/ML SOLN
50.0000 mL | Freq: Once | INTRAVENOUS | Status: AC | PRN
  Administered 2020-06-12: 50 mL via INTRAVENOUS

## 2020-06-12 MED ORDER — SODIUM CHLORIDE 0.9% IV SOLUTION: Freq: Once | INTRAVENOUS | Status: AC

## 2020-06-12 NOTE — Consult Note (Addendum)
[facility]  Total days of antibiotics 2       Reason for Consult: prolonged acute covid-19 illness    Referring Physician: hall  Active Problems:   Acute respiratory failure with hypoxia [facility])    HPI: Tyrone Roman is a 75 y.o. male with history of RA, on chronic steroids, CAD s/p pci, HTN, HLd, COPD, who has had multiple admissions for covid illness(initially jan 2022) including  complicated by PE (05/13/20) placed on eliquis. He has received a couple course of remdesivir, most recent admission on 4/20-4/28, he had worsening clot burden while on eliquis plus concern for re-infection of COVID-19, thus he was treated with remdesivir, bebtelovimab and steroids on 05/26/20. Due to continued high dose steroids, he was continued on PJP/nocardia prophylaxis. His anticoagulation changed to xarelto. He has had extensive FUO work to ensure no signs of OI infection. He was readmitted for worsening shortness of breath despite 3L [place] which on admit showed he had hypoxia requiring 15L NRB O2. Pulmonary team asked for ID input. CT roughly the same as last month. Procalcitonin < 0.10. CRP elevated at 6.1, ferritin at 1855. LDH 509, CRP at 6.1 (previsouly at 1.7 in late April)   5/8 CT value one of 20.2.  April 2022- CT value of 24.2. and early April 29.6. His february CT was 37.5 -- his April admission , there was suspect he may have been reinfected with the due to change in symptoms and CT value trending.   Patient never known to mount any antibody response. Has not received evusheld.  Past Medical History:  Diagnosis Date  . Allergy   . Barrett's esophageal ulceration   . BPH (benign prostatic hyperplasia)   . CAD (coronary artery disease)    sees Dr. Juanito Doom  . Cataract    both eyes removed   . Colonic polyp   . Concussion with loss of consciousness of 30 minutes or less   . Contact dermatitis   . Contact with or exposure to venereal diseases   . COPD (chronic  obstructive pulmonary disease) (HCC)    sees Dr. Cyril Mourning   . Diverticulosis of colon   . Dysphagia   . GERD (gastroesophageal reflux disease)    past hx- on meds   . HNP (herniated nucleus pulposus), lumbar    recurrent  . Hyperlipidemia   . Hypertension   . Myocardial infarction [facility])    2011- stent placed  . Nephrolithiasis   . Rheumatoid arthritis [facility])    sees Dr. Zenovia Jordan   . SOB (shortness of breath)     Allergies:  Allergies  Allergen Reactions  . Cephalexin Shortness Of Breath, Rash and Other (See Comments)    Tolerated Augmentin 2015 and 2017 (12-2017). Tolerated nafcillin 12/2017 PATIENT HAS HAD A PCN REACTION WITH IMMEDIATE RASH, FACIAL/TONGUE/THROAT SWELLING, SOB, OR LIGHTHEADEDNESS WITH HYPOTENSION:  #  #  YES  #  #  Has patient had a PCN reaction causing severe rash involving mucus membranes or skin necrosis: No Has patient had a PCN reaction that required hospitalization: No Has patient had a PCN reaction occurring within the last 10 years: No If all of the above answers are "NO", then may proceed  . Clarithromycin Shortness Of Breath and Rash  . Certolizumab Pegol Hives  . Tocilizumab Hives  . Doxycycline Rash  . Hydroxyzine Rash  . Lidoderm Other (See Comments)    "made me act weird"  . Pyrithione Zinc Rash  MEDICATIONS: . vitamin C  500 mg Oral Daily  . docusate sodium  100 mg Oral BID  . hydrocortisone sod succinate (SOLU-CORTEF) inj  50 mg Intravenous Q6H  . insulin glargine  15 Units Subcutaneous QHS  . lactose free nutrition  237 mL Oral TID WC  . metoprolol tartrate  50 mg Oral BID  . mometasone-formoterol  2 puff Inhalation BID  . pantoprazole  40 mg Oral Daily  . Rivaroxaban  15 mg Oral BID WC  . [START ON 06/16/2020] rivaroxaban  20 mg Oral Q supper  . simvastatin  20 mg Oral Daily  . sodium chloride flush  3 mL Intravenous Q12H  . sodium chloride flush  3 mL Intravenous Q12H  . [START ON 06/13/2020] sulfamethoxazole-trimethoprim   1 tablet Oral Once per day on Mon Wed Fri  . tamsulosin  0.4 mg Oral Daily  . umeclidinium bromide  1 puff Inhalation Daily  . zinc sulfate  220 mg Oral Daily    Social History   Tobacco Use  . Smoking status: Former Smoker    Packs/day: 1.00    Years: 20.00    Pack years: 20.00    Types: Cigarettes    Quit date: 10/12/1988    Years since quitting: 31.6  . Smokeless tobacco: Never Used  . Tobacco comment: 1960's   Vaping Use  . Vaping Use: Never used  Substance Use Topics  . Alcohol use: Not Currently    Alcohol/week: 0.0 standard drinks    Comment: rare  . Drug use: No    Family History  Problem Relation Age of Onset  . Heart attack Father        cardiovascular disorder  . Arthritis Father        family hx  . Colon cancer Father        mets  . Prostate cancer Father        1st degree relative  . Arthritis Mother   . Colon polyps Mother   . Melanoma Mother   . Dementia Mother   . Diabetes Paternal Uncle   . Colon cancer Paternal Uncle   . Stomach cancer Paternal Uncle   . Melanoma Paternal Uncle   . Cancer Maternal Grandmother   . Skin cancer Daughter   . Colon polyps Sister   . Esophageal cancer Neg Hx   . Rectal cancer Neg Hx      Review of Systems  Constitutional: Negative for fever, chills, diaphoresis, activity change, appetite change, fatigue and unexpected weight change.  HENT: Negative for congestion, sore throat, rhinorrhea, sneezing, trouble swallowing and sinus pressure.  Eyes: Negative for photophobia and visual disturbance.  Respiratory: positive for cough, chest tightness, shortness of breath, wheezing and stridor.  Cardiovascular: Negative for chest pain, palpitations and leg swelling.  Gastrointestinal: Negative for nausea, vomiting, abdominal pain, diarrhea, constipation, blood in stool, abdominal distention and anal bleeding.  Genitourinary: Negative for dysuria, hematuria, flank pain and difficulty urinating.  Musculoskeletal: Negative for  myalgias, back pain, joint swelling, arthralgias and gait problem.  Skin: Negative for color change, pallor, rash and wound.  Neurological: Negative for dizziness, tremors, weakness and light-headedness.  Hematological: Negative for adenopathy. Does not bruise/bleed easily.  Psychiatric/Behavioral: Negative for behavioral problems, confusion, sleep disturbance, dysphoric mood, decreased concentration and agitation.     OBJECTIVE: Temp:  [97.2 F (36.2 C)-98.5 F (36.9 C)] 97.2 F (36.2 C) (05/08 0946) Pulse Rate:  [93-127] 93 (05/08 0946) Resp:  [18-42] 19 (05/08 0946) BP: (107-140)/(60-108) 107/60 (  05/08 0946) SpO2:  [71 %-100 %] 98 % (05/08 0946) Physical Exam  Constitutional: He is oriented to person, place, and time. He appears well-developed and well-nourished. No distress.  HENT:  Mouth/Throat: Oropharynx is clear and moist. No oropharyngeal exudate.  Cardiovascular: Normal rate, regular rhythm and normal heart sounds. Exam reveals no gallop and no friction rub.  No murmur heard.  Pulmonary/Chest: Effort normal and breath sounds normal. No respiratory distress. He has no wheezes.  Abdominal: Soft. Bowel sounds are normal. He exhibits no distension. There is no tenderness.  Lymphadenopathy:  He has no cervical adenopathy.  Neurological: He is alert and oriented to person, place, and time.  Skin: Skin is warm and dry. No rash noted. No erythema.  Psychiatric: He has a normal mood and affect. His behavior is normal.     LABS: Results for orders placed or performed during the hospital encounter of 06/11/2020 (from the past 48 hour(s))  Resp Panel by RT-PCR (Flu A&B, Covid) Nasopharyngeal Swab     Status: Abnormal   Collection Time: 06/17/2020  3:10 AM   Specimen: Nasopharyngeal Swab; Nasopharyngeal(NP) swabs in vial transport medium  Result Value Ref Range   SARS Coronavirus 2 by RT PCR POSITIVE (A) NEGATIVE    Comment: RESULT CALLED TO, READ BACK BY AND VERIFIED WITH: RN J  VASQUEZ AT 9242 06/19/2020 BY L BENFIELD (NOTE) SARS-CoV-2 target nucleic acids are DETECTED.  The SARS-CoV-2 RNA is generally detectable in upper respiratory specimens during the acute phase of infection. Positive results are indicative of the presence of the identified virus, but do not rule out bacterial infection or co-infection with other pathogens not detected by the test. Clinical correlation with patient history and other diagnostic information is necessary to determine patient infection status. The expected result is Negative.  Fact Sheet for Patients: EntrepreneurPulse.com.au  Fact Sheet for Healthcare Providers: IncredibleEmployment.be  This test is not yet approved or cleared by the [place] FDA and  has been authorized for detection and/or diagnosis of SARS-CoV-2 by FDA under an Emergency Use Authorization (EUA).  This EUA will remain in effect (meaning this t est can be used) for the duration of  the COVID-19 declaration under Section 564(b)(1) of the Act, 21 U.S.C. section 360bbb-3(b)(1), unless the authorization is terminated or revoked sooner.     Influenza A by PCR NEGATIVE NEGATIVE   Influenza B by PCR NEGATIVE NEGATIVE    Comment: (NOTE) The Xpert Xpress SARS-CoV-2/FLU/RSV plus assay is intended as an aid in the diagnosis of influenza from Nasopharyngeal swab specimens and should not be used as a sole basis for treatment. Nasal washings and aspirates are unacceptable for Xpert Xpress SARS-CoV-2/FLU/RSV testing.  Fact Sheet for Patients: EntrepreneurPulse.com.au  Fact Sheet for Healthcare Providers: IncredibleEmployment.be  This test is not yet approved or cleared by the [place] FDA and has been authorized for detection and/or diagnosis of SARS-CoV-2 by FDA under an Emergency Use Authorization (EUA). This EUA will remain in effect (meaning this test can be used) for the  duration of the COVID-19 declaration under Section 564(b)(1) of the Act, 21 U.S.C. section 360bbb-3(b)(1), unless the authorization is terminated or revoked.  Performed at [facility], [place] [address] [place], [place] [address]   Lactic acid, plasma     Status: Abnormal   Collection Time: 06/08/2020  3:19 AM  Result Value Ref Range   Lactic Acid, Venous 4.5 (HH) 0.5 - 1.9 mmol/L    Comment: CRITICAL RESULT CALLED TO, READ BACK  BY AND VERIFIED WITH: VASQUEZ J,RN 06/17/2020 0427 WAYK Performed at [facility] [address], [place], [place] [address]   Comprehensive metabolic panel     Status: Abnormal   Collection Time: 06/11/2020  3:20 AM  Result Value Ref Range   Sodium 132 (L) 135 - 145 mmol/L   Potassium 4.6 3.5 - 5.1 mmol/L   Chloride 101 98 - 111 mmol/L   CO2 23 22 - 32 mmol/L   Glucose, Bld 233 (H) 70 - 99 mg/dL    Comment: Glucose reference range applies only to samples taken after fasting for at least 8 hours.   BUN 32 (H) 8 - 23 mg/dL   Creatinine, Ser 0.98 0.61 - 1.24 mg/dL   Calcium 9.6 8.9 - 10.3 mg/dL   Total Protein 5.4 (L) 6.5 - 8.1 g/dL   Albumin 2.2 (L) 3.5 - 5.0 g/dL   AST 53 (H) 15 - 41 U/L   ALT 61 (H) 0 - 44 U/L   Alkaline Phosphatase 99 38 - 126 U/L   Total Bilirubin 0.3 0.3 - 1.2 mg/dL   GFR, Estimated >60 >60 mL/min    Comment: (NOTE) Calculated using the CKD-EPI Creatinine Equation (2021)    Anion gap 8 5 - 15    Comment: Performed at [facility], [place] [address], [place], [place] [address]  CBC WITH DIFFERENTIAL     Status: Abnormal   Collection Time: 06/16/2020  3:20 AM  Result Value Ref Range   WBC 9.6 4.0 - 10.5 K/uL   RBC 5.19 4.22 - 5.81 MIL/uL   Hemoglobin 14.9 13.0 - 17.0 g/dL   HCT 47.3 39.0 - 52.0 %   MCV 91.1 80.0 - 100.0 fL   MCH 28.7 26.0 - 34.0 pg   MCHC 31.5 30.0 - 36.0 g/dL   RDW 20.1 (H) 11.5 - 15.5 %   Platelets PLATELET CLUMPS NOTED ON SMEAR, UNABLE TO ESTIMATE 150 - 400 K/uL   nRBC 0.0 0.0 - 0.2 %    Neutrophils Relative % 93 %   Neutro Abs 8.9 (H) 1.7 - 7.7 K/uL   Lymphocytes Relative 3 %   Lymphs Abs 0.3 (L) 0.7 - 4.0 K/uL   Monocytes Relative 2 %   Monocytes Absolute 0.2 0.1 - 1.0 K/uL   Eosinophils Relative 0 %   Eosinophils Absolute 0.0 0.0 - 0.5 K/uL   Basophils Relative 0 %   Basophils Absolute 0.0 0.0 - 0.1 K/uL   Immature Granulocytes 2 %   Abs Immature Granulocytes 0.15 (H) 0.00 - 0.07 K/uL   Ovalocytes PRESENT     Comment: Performed at [facility], [address], [place], [place] [address]  Protime-INR     Status: Abnormal   Collection Time: 06/14/2020  3:20 AM  Result Value Ref Range   Prothrombin Time 20.1 (H) 11.4 - 15.2 seconds   INR 1.7 (H) 0.8 - 1.2    Comment: (NOTE) INR goal varies based on device and disease states. Performed at [facility], [place] [address], [place], [place] [address]   APTT     Status: None   Collection Time: 06/21/2020  3:20 AM  Result Value Ref Range   aPTT 30 24 - 36 seconds    Comment: Performed at [facility] [address], [place], [place] [address]  Troponin I (High Sensitivity)     Status: Abnormal   Collection Time: 06/23/2020  3:20 AM  Result Value Ref Range  Troponin I (High Sensitivity) 217 (HH) <18 ng/L    Comment: CRITICAL RESULT CALLED TO, READ BACK BY AND VERIFIED WITH: Billie Lade Jun 27, 2020 0524 WAYK Performed at [facility] [address] [place], [place] [address]   Urinalysis, Routine w reflex microscopic Urine, Clean Catch     Status: Abnormal   Collection Time: 27-Jun-2020  3:45 AM  Result Value Ref Range   Color, Urine YELLOW YELLOW   APPearance CLEAR CLEAR   Specific Gravity, Urine 1.024 1.005 - 1.030   pH 5.0 5.0 - 8.0   Glucose, UA 50 (A) NEGATIVE mg/dL   Hgb urine dipstick NEGATIVE NEGATIVE   Bilirubin Urine NEGATIVE NEGATIVE   Ketones, ur NEGATIVE NEGATIVE mg/dL   Protein, ur NEGATIVE NEGATIVE mg/dL   Nitrite NEGATIVE NEGATIVE   Leukocytes,Ua NEGATIVE NEGATIVE     Comment: Performed at [facility] [address] [place], [place] [address]  Lactic acid, plasma     Status: Abnormal   Collection Time: 06/27/20  5:45 AM  Result Value Ref Range   Lactic Acid, Venous 2.8 (HH) 0.5 - 1.9 mmol/L    Comment: CRITICAL VALUE NOTED.  VALUE IS CONSISTENT WITH PREVIOUSLY REPORTED AND CALLED VALUE. Performed at [facility], [place] [address] [place], [place] [address]   Troponin I (High Sensitivity)     Status: Abnormal   Collection Time: 27-Jun-2020  6:39 AM  Result Value Ref Range   Troponin I (High Sensitivity) 202 (HH) <18 ng/L    Comment: CRITICAL VALUE NOTED.  VALUE IS CONSISTENT WITH PREVIOUSLY REPORTED AND CALLED VALUE. (NOTE) Elevated high sensitivity troponin I (hsTnI) values and significant  changes across serial measurements may suggest ACS but many other  chronic and acute conditions are known to elevate hsTnI results.  Refer to the Links section for chest pain algorithms and additional  guidance. Performed at [facility], [place] [address] [place], [place] [address]   I-Stat arterial blood gas, ED     Status: Abnormal   Collection Time: 27-Jun-2020  6:43 AM  Result Value Ref Range   pH, Arterial 7.439 7.350 - 7.450   pCO2 arterial 29.9 (L) 32.0 - 48.0 mmHg   pO2, Arterial 102 83.0 - 108.0 mmHg   Bicarbonate 20.2 20.0 - 28.0 mmol/L   TCO2 21 (L) 22 - 32 mmol/L   O2 Saturation 98.0 %   Acid-base deficit 3.0 (H) 0.0 - 2.0 mmol/L   Sodium 132 (L) 135 - 145 mmol/L   Potassium 4.3 3.5 - 5.1 mmol/L   Calcium, Ion 1.17 1.15 - 1.40 mmol/L   HCT 35.0 (L) 39.0 - 52.0 %   Hemoglobin 11.9 (L) 13.0 - 17.0 g/dL   Patient temperature 98.5 F    Collection site Radial    Drawn by Operator    Sample type ARTERIAL   C-reactive protein     Status: Abnormal   Collection Time: 2020/06/27 10:14 AM  Result Value Ref Range   CRP 6.1 (H) <1.0 mg/dL    Comment: Performed at [facility], [address], [place], [place] [address]  D-dimer,  quantitative (not at [facility])     Status: Abnormal   Collection Time: 06-27-20 10:14 AM  Result Value Ref Range   D-Dimer, Quant 0.78 (H) 0.00 - 0.50 ug/mL-FEU    Comment: (NOTE) At the manufacturer cut-off value of 0.5 g/mL FEU, this assay has a negative predictive value of 95-100%.This assay is intended for use in conjunction with  a clinical pretest probability (PTP) assessment model to exclude pulmonary embolism (PE) and deep venous thrombosis (DVT) in outpatients suspected of PE or DVT. Results should be correlated with clinical presentation. Performed at [facility], [place] [address] [place], [place] [address]   Ferritin     Status: Abnormal   Collection Time: 07/01/2020 10:14 AM  Result Value Ref Range   Ferritin 1,855 (H) 24 - 336 ng/mL    Comment: Performed at [facility], [place] [address] [place], [place] [address]  Fibrinogen     Status: Abnormal   Collection Time: 06/09/2020 10:14 AM  Result Value Ref Range   Fibrinogen 659 (H) 210 - 475 mg/dL    Comment: Performed at [facility] [address], [place], [place] [address]  Lactate dehydrogenase     Status: Abnormal   Collection Time: 07/01/2020 10:14 AM  Result Value Ref Range   LDH 509 (H) 98 - 192 U/L    Comment: Performed at [facility], [place] [address], [place], [place] [address]  Procalcitonin     Status: None   Collection Time: 06/08/2020 10:14 AM  Result Value Ref Range   Procalcitonin <0.10 ng/mL    Comment:        Interpretation: PCT (Procalcitonin) <= 0.5 ng/mL: Systemic infection (sepsis) is not likely. Local bacterial infection is possible. (NOTE)       Sepsis PCT Algorithm           Lower Respiratory Tract                                      Infection PCT Algorithm    ----------------------------     ----------------------------         PCT < 0.25 ng/mL                PCT < 0.10 ng/mL          Strongly encourage             Strongly discourage   discontinuation of antibiotics     initiation of antibiotics    ----------------------------     -----------------------------       PCT 0.25 - 0.50 ng/mL            PCT 0.10 - 0.25 ng/mL               OR       >80% decrease in PCT            Discourage initiation of                                            antibiotics      Encourage discontinuation           of antibiotics    ----------------------------     -----------------------------         PCT >= 0.50 ng/mL              PCT 0.26 - 0.50 ng/mL               AND        <80% decrease in PCT             Encourage initiation of  antibiotics       Encourage continuation           of antibiotics    ----------------------------     -----------------------------        PCT >= 0.50 ng/mL                  PCT > 0.50 ng/mL               AND         increase in PCT                  Strongly encourage                                      initiation of antibiotics    Strongly encourage escalation           of antibiotics                                     -----------------------------                                           PCT <= 0.25 ng/mL                                                 OR                                        > 80% decrease in PCT                                      Discontinue / Do not initiate                                             antibiotics  Performed at [facility], [address], [place], [place] [address]   Lactic acid, plasma     Status: Abnormal   Collection Time: 06/29/2020 10:14 AM  Result Value Ref Range   Lactic Acid, Venous 2.7 (HH) 0.5 - 1.9 mmol/L    Comment: CRITICAL VALUE NOTED.  VALUE IS CONSISTENT WITH PREVIOUSLY REPORTED AND CALLED VALUE. Performed at [facility], [place] [address] [place], [place] [address]     MICRO: reviewed IMAGING: CT ANGIO CHEST PE W OR WO CONTRAST  Result Date: 06/07/2020 CLINICAL DATA:  I more foci of EXAM: CT ANGIOGRAPHY CHEST WITH  CONTRAST TECHNIQUE: Multidetector CT imaging of the chest was performed using the standard protocol during bolus administration of intravenous contrast. Multiplanar CT image reconstructions and MIPs were obtained to evaluate the vascular anatomy. CONTRAST:  44mL OMNIPAQUE IOHEXOL 350 MG/ML SOLN COMPARISON:  CT angiography 05/14/2020, CT angiography 03/26/2020, CT angiography 11/27/2010 FINDINGS: Cardiovascular: Satisfactory opacification of the pulmonary arteries to the  segmental level. Filling defect in the right lower lobe segmental pulmonary artery (5:52). No central pulmonary embolus. No definite left pulmonary embolus. Left ventricular hypertrophy. No pericardial effusion. Four-vessel coronary artery calcification. Mediastinum/Nodes: No enlarged mediastinal, hilar, or axillary lymph nodes. Thyroid gland, trachea, and esophagus demonstrate no significant findings. Lungs/Pleura: Redemonstration of cystic changes that are more apparent within the upper lobes likely related to emphysematous changes. Interlobular septal wall thickening. Redemonstration of peribronchovascular ground-glass airspace opacities and consolidation with associated bronchiectasis. No pleural effusion. No pneumothorax. Upper Abdomen: No acute abnormality. Musculoskeletal: No abdominal wall hernia or abnormality. No suspicious lytic or blastic osseous lesions. No acute displaced fracture. Multilevel degenerative changes of the spine. Review of the MIP images confirms the above findings. IMPRESSION: 1. Residual segmental right lower lobe pulmonary embolus with interval resolution of the central pulmonary embolus component. No associated heart strain. No definite pulmonary infarction. 2. Diffuse pulmonary findings similar to at least as far back as 03/16/2020. Findings could be infectious/inflammatory. Underlying malignancy not excluded. 3. Left ventricular hypertrophy. 4. Aortic Atherosclerosis (ICD10-I70.0) and Emphysema (ICD10-J43.9). 5.  Four-vessel coronary artery calcifications. These results were called by telephone at the time of interpretation on 06/17/2020 at 5:36 am to provider [facility] , who verbally acknowledged these results. Electronically Signed   By: Iven Finn M.D.   On: 06/11/2020 05:41   DG Chest Port 1 View  Result Date: 06/25/2020 CLINICAL DATA:  Sepsis EXAM: PORTABLE CHEST 1 VIEW COMPARISON:  05/30/2020 FINDINGS: Bilateral interstitial opacities, unchanged. Normal cardiomediastinal contours. No pneumothorax or sizable pleural effusion. IMPRESSION: Unchanged bilateral interstitial opacities. Electronically Signed   By: Ulyses Jarred M.D.   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 Unclear if all from January infection or possible re-infection in April, nonetheless - he is having prolonged acute  covid illness despite mAb, and antivirals.  - consider high titer convalescent plasma and if no access, possibly ivig - also may consider giving 2nd course of mAb -  Immunoglobulins studies pending - recommend to check urine legionella - pneumocystis pjp stain - RVP since other respiratory illnesses are still circulating  - has colonization for c.glabrata - can empirically treat with anidulafungin, and will check fungitell and respiratory culture  - oi proph = continue with bactrim DS daily

## 2020-06-12 NOTE — ED Notes (Signed)
Attempted 2 times for IV access and blood work. Unsuccessful.

## 2020-06-12 NOTE — ED Notes (Signed)
Pt transported to CT with this RN. Pt is back in room and resting.Pt was placed on 10L HNF and sp02 dropped to 82%. Pt was placed back on NRB . Pt sp02 is 98-100 . Pt is alert & oriented x4 at this time.will continue to monitor. Pt is on cardiac monitoring.

## 2020-06-12 NOTE — ED Provider Notes (Signed)
[facility] EMERGENCY DEPARTMENT Provider Note   CSN: [id number] Arrival date & time: 06/25/2020  0310     History Chief Complaint  Patient presents with  . Shortness of Breath    Tyrone Roman is a 75 y.o. male.  Patient presents to the emergency department for evaluation of shortness of breath.  Patient has recently been hospitalized with multiple problems including COVID-pneumonia and PE.  He normally wears 3 L of oxygen continuously but today has had increasing shortness of breath.  He increased his oxygen to 5 L today but tonight breathing worsened and called EMS.  Patient brought in on 10 L and his oxygen saturations are improved.  He is not experiencing any associated chest pain.        Past Medical History:  Diagnosis Date  . Allergy   . Barrett's esophageal ulceration   . BPH (benign prostatic hyperplasia)   . CAD (coronary artery disease)    sees Dr. Mar Daring  . Cataract    both eyes removed   . Colonic polyp   . Concussion with loss of consciousness of 30 minutes or less   . Contact dermatitis   . Contact with or exposure to venereal diseases   . COPD (chronic obstructive pulmonary disease) ([place])    sees Dr. Kara Mead   . Diverticulosis of colon   . Dysphagia   . GERD (gastroesophageal reflux disease)    past hx- on meds   . HNP (herniated nucleus pulposus), lumbar    recurrent  . Hyperlipidemia   . Hypertension   . Myocardial infarction [facility])    2011- stent placed  . Nephrolithiasis   . Rheumatoid arthritis [facility])    sees Dr. Gavin Pound   . SOB (shortness of breath)     Patient Active Problem List   Diagnosis Date Noted  . Acute hypoxemic respiratory failure ([place]) 05/25/2020  . Pulmonary embolus ([place]) 05/25/2020  . SIRS (systemic inflammatory response syndrome) ([place]) 05/25/2020  . Elevated troponin 05/25/2020  . Weakness of both lower extremities   . FUO (fever of unknown origin)   . Sepsis due to pneumonia ([place]) 05/09/2020   . Post-COVID-19 syndrome manifesting as chronic cough 05/09/2020  . COPD without exacerbation ([place]) 05/09/2020  . Essential hypertension 04/26/2020  . Acute hypoxemic respiratory failure due to COVID-19 ([place]) 04/03/2020  . Acute respiratory failure with hypoxia ([place]) 03/28/2020  . Physical deconditioning 03/26/2020  . Normocytic anemia 03/26/2020  . Severe sepsis ([place]) 03/25/2020  . Multifocal pneumonia 03/17/2020  . COPD with acute exacerbation ([place]) 03/17/2020  . Hypoxia 03/17/2020  . CAD (coronary artery disease) 03/17/2020  . Thrombocytopenia ([place]) 03/08/2020  . Stage 3b chronic kidney disease ([place]) 03/08/2020  . Pneumonia due to COVID-19 virus 02/29/2020  . COVID-19 02/29/2020  . Closed nondisplaced fracture of shaft of third metacarpal bone of left hand 03/04/2019  . Wound infection after surgery 01/01/2018  . Medication monitoring encounter 01/01/2018  . Sepsis ([place]) 12/10/2017  . HNP (herniated nucleus pulposus), lumbar 10/07/2017  . Lumbar herniated disc 10/07/2017  . Psoriasis of scalp 03/27/2016  . Rheumatoid arthritis ([place]) 03/25/2013  . GI bleeding 02/20/2012  . Dyspnea on exertion 09/08/2010  . COPD (chronic obstructive pulmonary disease) ([place]) 08/04/2010  . Bruising 08/04/2010  . TINNITUS 12/16/2009  . Dizziness and giddiness 12/16/2009  . NECK SPRAIN AND STRAIN 10/21/2009  . LUMBAR SPRAIN AND STRAIN 10/21/2009  . Hyperlipidemia 06/10/2009  . Coronary artery disease of native artery of native  heart with stable angina pectoris ([place]) 06/10/2009  . GERD 06/10/2009  . BARRETTS ESOPHAGUS 06/10/2009  . CONCUSSION WITH LOC OF 30 MINUTES OR LESS 02/28/2009  . NEPHROLITHIASIS 07/01/2007  . CONTACT DERMATITIS 12/12/2006  . BPH (benign prostatic hyperplasia) 11/04/2006  . COLONIC POLYPS 10/21/2003  . DIVERTICULOSIS, COLON 10/21/2003    Past Surgical History:  Procedure Laterality Date  . BACK SURGERY    . CARDIAC CATHETERIZATION N/A 01/19/2016   Procedure: Left  Heart Cath and Coronary Angiography;  Surgeon: Burnell Blanks, MD;  Location: [facility];  Service: Cardiovascular;  Laterality: N/A;  . COLONOSCOPY  12/09/2014   per Dr. Silverio Decamp, adenomatous polyps, repeat 3 years   . CORONARY ANGIOPLASTY WITH STENT PLACEMENT    . ESOPHAGOGASTRODUODENOSCOPY (EGD) WITH ESOPHAGEAL DILATION  02/17/09   Barretts esophagus   . EYE SURGERY    . KNEE ARTHROSCOPY Left X 2  . LUMBAR LAMINECTOMY/DECOMPRESSION MICRODISCECTOMY Right 10/07/2017   Procedure: RIGHT LUMBAR ONE-TWO LAMINECTOMY WITH MICRODISCECTOMY;  Surgeon: Consuella Lose, MD;  Location: [facility];  Service: Neurosurgery;  Laterality: Right;  . LUMBAR LAMINECTOMY/DECOMPRESSION MICRODISCECTOMY N/A 12/06/2017   Procedure: RECURRENT MICRODISCECTOMY LUMBAR ONE - LUMBAR TWO;  Surgeon: Consuella Lose, MD;  Location: [facility];  Service: Neurosurgery;  Laterality: N/A;  . LUMBAR WOUND DEBRIDEMENT N/A 12/11/2017   Procedure: LUMBAR WOUND DEBRIDEMENT;  Surgeon: Consuella Lose, MD;  Location: [facility];  Service: Neurosurgery;  Laterality: N/A;  . POLYPECTOMY    . TONSILLECTOMY    . UPPER GASTROINTESTINAL ENDOSCOPY         Family History  Problem Relation Age of Onset  . Heart attack Father        cardiovascular disorder  . Arthritis Father        family hx  . Colon cancer Father        mets  . Prostate cancer Father        1st degree relative  . Arthritis Mother   . Colon polyps Mother   . Melanoma Mother   . Dementia Mother   . Diabetes Paternal Uncle   . Colon cancer Paternal Uncle   . Stomach cancer Paternal Uncle   . Melanoma Paternal Uncle   . Cancer Maternal Grandmother   . Skin cancer Daughter   . Colon polyps Sister   . Esophageal cancer Neg Hx   . Rectal cancer Neg Hx     Social History   Tobacco Use  . Smoking status: Former Smoker    Packs/day: 1.00    Years: 20.00    Pack years: 20.00    Types: Cigarettes    Quit date: 10/12/1988    Years since quitting: 31.6  .  Smokeless tobacco: Never Used  . Tobacco comment: 1960's   Vaping Use  . Vaping Use: Never used  Substance Use Topics  . Alcohol use: Not Currently    Alcohol/week: 0.0 standard drinks    Comment: rare  . Drug use: No    Home Medications Prior to Admission medications   Medication Sig Start Date End Date Taking? Authorizing Provider  Accu-Chek Softclix Lancets lancets Use as directed. 06/02/20   Thurnell Lose, MD  acetaminophen (TYLENOL) 325 MG tablet Take 650 mg by mouth every 6 (six) hours as needed for headache, mild pain or fever.    [provider]  albuterol (VENTOLIN HFA) 108 (90 Base) MCG/ACT inhaler Inhale 2 puffs into the lungs every 4 (four) hours as needed for wheezing or shortness of breath.  05/18/20   Ghimire, Henreitta Leber, MD  Blood Glucose Monitoring Suppl (ACCU-CHEK GUIDE) w/Device KIT Use as directed 06/02/20   Thurnell Lose, MD  budesonide-formoterol [facility]) 160-4.5 MCG/ACT inhaler Inhale 2 puffs into the lungs in the morning and at bedtime. 05/18/20 06/17/20  Ghimire, Henreitta Leber, MD  glucose blood (ACCU-CHEK GUIDE) test strip Use as instructed up to 4 times daily 06/02/20   Thurnell Lose, MD  insulin glargine (LANTUS) 100 UNIT/ML Solostar Pen Inject 15 Units into the skin at bedtime. 06/02/20   Thurnell Lose, MD  insulin lispro (HUMALOG KWIKPEN) 100 UNIT/ML KwikPen Before each meal 3 times a day, 140-199 - 2 units, 200-250 - 4 units, 251-299 - 6 units,  300-349 - 8 units,  350 or above 10 units. Patient taking differently: Before each meal 3 times a day, 140-199 - 2 units, 200-250 - 4 units, 251-299 - 6 units,  300-349 - 8 units,  350 or above 10 units. 06/02/20   Thurnell Lose, MD  Insulin Pen Needle 32G X 4 MM MISC use as directed with insulin pens Patient taking differently: use as directed with insulin pens 06/02/20   Thurnell Lose, MD  lactose free nutrition (BOOST PLUS) LIQD Take 237 mLs by mouth 3 (three) times daily with meals. 05/18/20  06/17/20  Ghimire, Henreitta Leber, MD  melatonin 5 MG TABS Take 5 mg by mouth at bedtime as needed (sleep).    [provider]  Menthol (ICY HOT) 5 % PTCH Apply 1 patch topically 2 (two) times daily as needed (back pain). Apply to lower back.    [provider]  metoprolol tartrate (LOPRESSOR) 50 MG tablet Take 1 tablet (50 mg total) by mouth 2 (two) times daily. 06/02/20   Thurnell Lose, MD  nystatin (MYCOSTATIN) 100000 UNIT/ML suspension Take 5 mLs (500,000 Units total) by mouth 4 (four) times daily. 06/08/20   [name], MD  omeprazole (PRILOSEC) 40 MG capsule Take 1 capsule (40 mg total) by mouth daily. 05/18/20   Ghimire, Henreitta Leber, MD  predniSONE (DELTASONE) 10 MG tablet Take 3 tablets (30 mg total) by mouth every 12 (twelve) hours. 05/18/20   Ghimire, Henreitta Leber, MD  Rivaroxaban (XARELTO) 15 MG TABS tablet Take 1 tablet (15 mg total) by mouth 2 (two) times daily with a meal for 13 days. 06/02/20 06/15/20  Thurnell Lose, MD  rivaroxaban (XARELTO) 20 MG TABS tablet Take 1 tablet (20 mg total) by mouth daily with supper. **Start after completing Xarelto 71m twice daily tablets** 06/16/20   SThurnell Lose MD  simvastatin (ZOCOR) 20 MG tablet Take 1 tablet (20 mg total) by mouth daily. 05/18/20   Ghimire, SHenreitta Leber MD  sulfamethoxazole-trimethoprim (BACTRIM DS) 800-160 MG tablet Take 1 tablet by mouth 3 (three) times a week. 06/03/20   SThurnell Lose MD  tamsulosin (FLOMAX) 0.4 MG CAPS capsule Take 1 capsule (0.4 mg total) by mouth daily. 05/19/20   Ghimire, SHenreitta Leber MD  Tiotropium Bromide Monohydrate (SPIRIVA RESPIMAT) 2.5 MCG/ACT AERS Inhale 2 puffs into the lungs daily. 05/18/20   Ghimire, SHenreitta Leber MD    Allergies    Cephalexin, Clarithromycin, Certolizumab pegol, Tocilizumab, Doxycycline, Hydroxyzine, Lidoderm, and Pyrithione zinc  Review of Systems   Review of Systems  Respiratory: Positive for shortness of breath.   All other systems reviewed and are  negative.   Physical Exam Updated Vital Signs BP 132/89   Pulse (!) 101   Temp 98.5 F (36.9  C) (Oral)   Resp (!) 23   SpO2 100%   Physical Exam Vitals reviewed.  Constitutional:      General: He is in acute distress.     Appearance: He is well-developed.  HENT:     Head: Atraumatic.     Comments: Multiple sores on tongue and inside mouth Cardiovascular:     Rate and Rhythm: Tachycardia present.  Pulmonary:     Effort: Tachypnea present.  Abdominal:     Palpations: Abdomen is soft.  Musculoskeletal:        General: Normal range of motion.     Cervical back: Neck supple.  Skin:    General: Skin is dry.  Neurological:     General: No focal deficit present.     ED Results / Procedures / Treatments   Labs (all labs ordered are listed, but only abnormal results are displayed) Labs Reviewed  RESP PANEL BY RT-PCR (FLU A&B, COVID) ARPGX2 - Abnormal; Notable for the following components:      Result Value   SARS Coronavirus 2 by RT PCR POSITIVE (*)    All other components within normal limits  LACTIC ACID, PLASMA - Abnormal; Notable for the following components:   Lactic Acid, Venous 4.5 (*)    All other components within normal limits  COMPREHENSIVE METABOLIC PANEL - Abnormal; Notable for the following components:   Sodium 132 (*)    Glucose, Bld 233 (*)    BUN 32 (*)    Total Protein 5.4 (*)    Albumin 2.2 (*)    AST 53 (*)    ALT 61 (*)    All other components within normal limits  CBC WITH DIFFERENTIAL/PLATELET - Abnormal; Notable for the following components:   RDW 20.1 (*)    Neutro Abs 8.9 (*)    Lymphs Abs 0.3 (*)    Abs Immature Granulocytes 0.15 (*)    All other components within normal limits  PROTIME-INR - Abnormal; Notable for the following components:   Prothrombin Time 20.1 (*)    INR 1.7 (*)    All other components within normal limits  URINALYSIS, ROUTINE W REFLEX MICROSCOPIC - Abnormal; Notable for the following components:   Glucose, UA 50  (*)    All other components within normal limits  TROPONIN I (HIGH SENSITIVITY) - Abnormal; Notable for the following components:   Troponin I (High Sensitivity) 217 (*)    All other components within normal limits  CULTURE, BLOOD (SINGLE)  URINE CULTURE  APTT  LACTIC ACID, PLASMA  I-STAT ARTERIAL BLOOD GAS, ED  TROPONIN I (HIGH SENSITIVITY)    EKG EKG Interpretation  Date/Time:  Sunday Jun 12 2020 03:13:54 EDT Ventricular Rate:  129 PR Interval:  131 QRS Duration: 121 QT Interval:  324 QTC Calculation: 475 R Axis:   257 Text Interpretation: Sinus tachycardia Consider right atrial enlargement RBBB and LAFB Confirmed by Orpah Greek ([id number]) on 06/24/2020 4:38:39 AM   Radiology DG Chest Port 1 View  Result Date: 06/29/2020 CLINICAL DATA:  Sepsis EXAM: PORTABLE CHEST 1 VIEW COMPARISON:  05/30/2020 FINDINGS: Bilateral interstitial opacities, unchanged. Normal cardiomediastinal contours. No pneumothorax or sizable pleural effusion. IMPRESSION: Unchanged bilateral interstitial opacities. Electronically Signed   By: Ulyses Jarred M.D.   On: 06/15/2020 03:48    Procedures Procedures   Medications Ordered in ED Medications  lidocaine (XYLOCAINE) 2 % viscous mouth solution 15 mL (has no administration in time range)  mometasone-formoterol (DULERA) 200-5 MCG/ACT inhaler 2 puff (has no  administration in time range)  umeclidinium bromide (INCRUSE ELLIPTA) 62.5 MCG/INH 1 puff (has no administration in time range)  ipratropium-albuterol (DUONEB) 0.5-2.5 (3) MG/3ML nebulizer solution 3 mL (has no administration in time range)  Rivaroxaban (XARELTO) tablet 15 mg (has no administration in time range)  rivaroxaban (XARELTO) tablet 20 mg (has no administration in time range)  sodium chloride 0.9 % bolus 500 mL (0 mLs Intravenous Stopped 06/19/2020 0640)  iohexol (OMNIPAQUE) 350 MG/ML injection 50 mL (50 mLs Intravenous Contrast Given 06/11/2020 0519)    ED Course  I have reviewed the  triage vital signs and the nursing notes.  Pertinent labs & imaging results that were available during my care of the patient were reviewed by me and considered in my medical decision making (see chart for details).    MDM Rules/Calculators/A&P                          Patient presents to the emergency department for evaluation of shortness of breath.  Patient has had a complex recent medical course.  Patient with COVID-pneumonia and PEs as a complication of COVID over the last several months.  He has been hospitalized several times.  Patient normally on oxygen at home but has had progressively worsening oxygen demand over the course of the day and night.  Chest x-ray with bibasilar infiltrates, unchanged.  CT PE study performed.  Patient with residual right lower lobe PE with resolution of the central PE.  No new PE seen.  Diffuse pulmonary findings unchanged from previous images.  Tachycardia improving with fluid hydration.  Lactic acid is elevated but no obvious source of bacterial infection at this time.  CRITICAL CARE Performed by: Orpah Greek   Total critical care time: 35 minutes  Critical care time was exclusive of separately billable procedures and treating other patients.  Critical care was necessary to treat or prevent imminent or life-threatening deterioration.  Critical care was time spent personally by me on the following activities: development of treatment plan with patient and/or surrogate as well as nursing, discussions with consultants, evaluation of patient's response to treatment, examination of patient, obtaining history from patient or surrogate, ordering and performing treatments and interventions, ordering and review of laboratory studies, ordering and review of radiographic studies, pulse oximetry and re-evaluation of patient's condition.   Final Clinical Impression(s) / ED Diagnoses Final diagnoses:  Acute on chronic respiratory failure with hypoxia [facility])     Rx / DC Orders ED Discharge Orders    None       Orpah Greek, MD 06/07/2020 [id number]

## 2020-06-12 NOTE — Consult Note (Signed)
-  NAME:  Tyrone Roman, MRN:  [id number], DOB:  05-Mar-1945, LOS: 0 ADMISSION DATE:  06/07/2020, CONSULTATION DATE:  06/21/2020 REFERRING MD:  Dr. Lorin Mercy, CHIEF COMPLAINT:   Acute on chronic hypoxia , Recurrent COVID 19  History of Present Illness:  HPI: Tyrone Roman is a 75 y.o. male with medical history significant of RA, CAD s/p PCI, HTN, HLD, COPD, BPH, and COVID in January 2022 with recurrent need for hospitalizations since.  Most recently, he was hospitalized from 4/20-28 with recurrent fever/SOB and worsening clot burden.  With pulm and ID consultation, he was treated with remdesivir, steroids, and Bebtelovimab.  Additionally, he had a breakthrough PE on Eliquis and was switched to Xarelto.  Interestingly, he has been tested for COVID and has tested positive on 1/24, 2/18, 4/4, (negative IgG Ab on 4/9), 4/20 (negative IgG Ab on 4/21), and 5/8; cycle counts have been consistently <30 indicating that he remains infectious and this is thought to be related to immunosuppression from RA, preventing him from the ability to clear the infection or develop antibody.  He was recommended to go to SNF and refused.  He returned today with increasing SOB despite home 3L [place] O2.  He reports that he is able to transfer and sit up in the chair but hasn't walked since January.  His symptoms were somewhat better at the time of last discharge but have been persistently present until overnight when he became acutely more SOB.  Some cough, but this is less prominent.  He feels like he is going to die from this.  He was wearing his usual 3L but increased his O2 to 5L without improvement.  He was as low as 71% while in the ER and needed up to 15L NRB O2. ABG on admission 7.43/29.9/102/20.2/ 98% Na 132/K 4.3 /Ionized Calcium 1.17 Troponin 202 Lactic Acid 2.7    CT shows improvement of PE however he is requiring more O2 on 8L HFNC. Mentation has not been an issue in the ED today  Time line of previous  events/hospitilizations:  1/24 - 1/26 First admission for COVID PNA, in an immunocompromised patient, PCR positive,  she received IV steroids and remdesivir but was unable to use baricitinib due to underling immunosuppressants. Seen with Coag-neg staph bacteremia, ID seen with no reccs for ABT's  2/10 - 2/11 Readmitted recurrent SOB/hypoxia with substernal CP. Concern that steroid taper was completed to quickly, patient was placed on 10 day taper.  2/18 - 2/25 Readmitted for recurrent SOB/hypoxia. COVID PCR remained positive. Negative for PE. Received zosyn/vanc with switch to PO Augmentin at d/c. Treated with short steroid taper   2/27 - 3/2 Readmitted and treated for SOB with fever and AMS. ? Aspiration PNA and received empiric Unasyn and switched to Augmentin on d/c  4/4 - 4/13 Readmitted from [facility] place for hypoxic respiratory failure to be due to acute on chronic congestive heart failure. COVID PCR positive.He received broad spectrum ABT which were eventually d/c due to no obvious cause. Concern for possible vasculities that temporary resolves with steroids. Encephalopathy and fevers resolve with moderate dose steroids. D/C'd on 30mg  BID prednisone and Eliquis for new acute PE seen on CTA chest  Admitted 4/20-28 with recurrent fever/SOB and worsening clot burden.Received Remdesivir 05/26/2020 and MAB 05/27/2020 ( 16 days ago) . Was weaned to 3 L and home .   Readmitted 06/22/2020 with recurrent hypoxemia. New elevated Lactate to      Pertinent  Medical History  CAD with  prior MI s/p PCI with DES, Stroke, COPD, acute on chronic hypoxic respiratory failure on 2L [place] at baseline, recent COVID PNA, HTN, HLD, RA (previosuly on Rituxan), BPH prior lumbar surgery with herniated nucleus pulposis, Barrett's esophageal ulceration, and dysphagia  Significant Hospital Events: Including procedures, antibiotic start and stop dates in addition to other pertinent events   . See above   Interim History /  Subjective:  Asleep in bed, arouses to answer questions States he is feeling a bit better with higher oxygen flow. ABG on admission 7.43/29.9/102/20.2/ 98% Na 132/K 4.3 /Ionized Calcium 1.17 Troponin 202 Lactic Acid 2.7 WBC 9.6, HGB 14.9, platelets are clumped INR 1.7/PT 20.1 Glucose 233 BUN 32/Creatinine 0.98/GFR >60 No I&O per flow Albumin 2.2 AST 53/ALT 61 Normal CBC with platelet clumps INR 1.7 HS troponin 217, 202 Lactate 4.5, 2.8 UA: 50 glucose T max 97.9  Objective   Blood pressure 107/60, pulse 93, temperature (!) 97.2 F (36.2 C), temperature source Axillary, resp. rate 19, SpO2 98 %.       No intake or output data in the 24 hours ending 06/24/20 1058 There were no vitals filed for this visit.  Examination: General: Acute on chronic ill appearing elderly male lying in bed, in NAD HEENT: [place]/AT, MM pink/moist, PERRL,  Neuro: Alert and oriented x3, non-focal, deconditioned  CV: s1s2 regular rate and rhythm, no murmur, rubs, or gallops,  PULM:  Diminished air entry bilaterally, no added breath sounds bilaterally, oxygen saturations appropriate on 10l HFNC  GI: soft, bowel sounds active in all 4 quadrants, non-tender, non-distended Extremities: warm/dry, no edema  Skin: no rashes or lesions  Labs/imaging Previous admissions   4/2  bilateral lower extremity Doppler: Left popliteal vein, left posterior tibial vein, left peroneal vein DVT  4/6 bilateral lower extremity Doppler: Bilateral lower extremity DVT  4/9 SARS-COV-2 antibody-IgG: > Negative  4/9 Serum cryptococcal/histoplasma/blastomycosis antigen: > Negative  4/9 ANA > Negative  4/9  EBV IgM > Negative  4/9 GOLD QuantiFERON > Negative  4/9 CMV IgG/IgM > Negative  4/10  RA factor > 12.9 (WNL)  4/10 ACE > 34 (WNL)  4/12 SPEP > No M spike  4/13>> MPO/ANCA: Negative  4/20 CTA chest  Pulmonary artery embolus extending from the central righ pulmonary artery into the lobar and segmental branches of  the right lower lobe and right upper lobe, new since the prior CT. No CT evidence of right heart straining.  Imaging and Labs this admission I have personally reviewed 24-Jun-2020 CXR Bilateral interstitial opacities, unchanged. Normal cardiomediastinal contours. No pneumothorax or sizable pleural effusion. Unchanged bilateral interstitial opacities  06-24-2020 CTA Chest  Residual segmental right lower lobe pulmonary embolus with interval resolution of the central pulmonary embolus component. No associated heart strain. No definite pulmonary infarction. Diffuse pulmonary findings similar to at least as far back as 03/16/2020. Findings could be infectious/inflammatory. Underlying malignancy not excluded. Left ventricular hypertrophy. Aortic Atherosclerosis (ICD10-I70.0) and Emphysema (ICD10-J43.9). Four-vessel coronary artery calcifications.  06-24-20>> IgG pending  Resolved Hospital Problem list     Assessment & Plan:  Acute on Chronic Hypoxic Respiratory Failure  -Currently requiring 15L NRB - Saturation Goal > 90% - ABG prn  - CXR in am and prn - Nebs and Maintenance inhalers as ordered   Acute vs prolonged COVID infection in the setting of chronic immunosuppression ( Rituxin) Last dose 02/2020 -PCR remains positive since original swab 1/24 - Received MAB inpatient 05/27/2020 - Received Remdesivir 4/21 - Will consult ID, appreciate assist -  Check IgG - Consider IVIG infusion once we get results - Continue Steroids 50 mg IV Q 6 - Trend WBC and fever curve - Culture as is clinically indicated  New Pulmonary embolism despite adequate anticoagulation with Eliquis  - Seen with progressive PE on repeat CT 4/20  - No further extension on Xaralto 5/8 - Continue anticoagulation  Bilateral pulmonary infiltrates  Hx of COPD  P: Continue supplemental oxygen for stats greater than 90 Head of bed elevated 30 degrees. Ensure adequate pulmonary hygiene  Follow cultures  ID  consult Wean and titrate oxygen for sats > 90%  Continue anticoagulation  Continue Diflucan and Bactrim  Continue ABX as above Trend WBC and fever curve     Given patients prolonged illness with complicating underlying immunosuppression consideration needs to be given to non conventional therapies. On investigation there has been benefit seen to the use of IVIG in this specific situation. See link below for further discussion.  [email or web address]  I have discussed with Dr. Vaughan Browner and Dr. Graylon Good ID.   Best practice   Per primary   Labs   CBC: Recent Labs  Lab 06/17/2020 0320 07/02/2020 0643  WBC 9.6  --   NEUTROABS 8.9*  --   HGB 14.9 11.9*  HCT 47.3 35.0*  MCV 91.1  --   PLT PLATELET CLUMPS NOTED ON SMEAR, UNABLE TO ESTIMATE  --     Basic Metabolic Panel: Recent Labs  Lab 06/10/2020 0320 06/20/2020 0643  NA 132* 132*  K 4.6 4.3  CL 101  --   CO2 23  --   GLUCOSE 233*  --   BUN 32*  --   CREATININE 0.98  --   CALCIUM 9.6  --    GFR: CrCl cannot be calculated (Unknown ideal weight.). Recent Labs  Lab 06/20/2020 0319 06/15/2020 0320 07/03/2020 0545  WBC  --  9.6  --   LATICACIDVEN 4.5*  --  2.8*    Liver Function Tests: Recent Labs  Lab 06/20/2020 0320  AST 53*  ALT 61*  ALKPHOS 99  BILITOT 0.3  PROT 5.4*  ALBUMIN 2.2*   No results for input(s): LIPASE, AMYLASE in the last 168 hours. No results for input(s): AMMONIA in the last 168 hours.  ABG    Component Value Date/Time   PHART 7.439 06/09/2020 0643   PCO2ART 29.9 (L) 06/23/2020 0643   PO2ART 102 06/19/2020 0643   HCO3 20.2 06/15/2020 0643   TCO2 21 (L) 06/14/2020 0643   ACIDBASEDEF 3.0 (H) 06/23/2020 0643   O2SAT 98.0 07/02/2020 0643     Coagulation Profile: Recent Labs  Lab 06/06/2020 0320  INR 1.7*    Cardiac Enzymes: No results for input(s): CKTOTAL, CKMB, CKMBINDEX, TROPONINI in the last 168 hours.  HbA1C: Hgb A1c MFr Bld  Date/Time Value Ref Range  Status  05/12/2020 07:41 AM 6.3 (H) 4.8 - 5.6 % Final    Comment:    (NOTE) Pre diabetes:          5.7%-6.4%  Diabetes:              >6.4%  Glycemic control for   <7.0% adults with diabetes   04/01/2020 04:21 AM 7.7 (H) 4.8 - 5.6 % Final    Comment:    (NOTE) Pre diabetes:          5.7%-6.4%  Diabetes:              >6.4%  Glycemic control for   <7.0% adults with diabetes  CBG: No results for input(s): GLUCAP in the last 168 hours.  Review of Systems:   Please see the history of present illness. All other systems reviewed and are negative   Past Medical History:  He,  has a past medical history of Allergy, Barrett's esophageal ulceration, BPH (benign prostatic hyperplasia), CAD (coronary artery disease), Cataract, Colonic polyp, Concussion with loss of consciousness of 30 minutes or less, Contact dermatitis, Contact with or exposure to venereal diseases, COPD (chronic obstructive pulmonary disease) (HCC), Diverticulosis of colon, Dysphagia, GERD (gastroesophageal reflux disease), HNP (herniated nucleus pulposus), lumbar, Hyperlipidemia, Hypertension, Myocardial infarction (HCC), Nephrolithiasis, Rheumatoid arthritis (HCC), and SOB (shortness of breath).   Surgical History:   Past Surgical History:  Procedure Laterality Date  . BACK SURGERY    . CARDIAC CATHETERIZATION N/A 01/19/2016   Procedure: Left Heart Cath and Coronary Angiography;  Surgeon: Kathleene Hazel, MD;  Location: [facility] INVASIVE CV LAB;  Service: Cardiovascular;  Laterality: N/A;  . COLONOSCOPY  12/09/2014   per Dr. Lavon Paganini, adenomatous polyps, repeat 3 years   . CORONARY ANGIOPLASTY WITH STENT PLACEMENT    . ESOPHAGOGASTRODUODENOSCOPY (EGD) WITH ESOPHAGEAL DILATION  02/17/09   Barretts esophagus   . EYE SURGERY    . KNEE ARTHROSCOPY Left X 2  . LUMBAR LAMINECTOMY/DECOMPRESSION MICRODISCECTOMY Right 10/07/2017   Procedure: RIGHT LUMBAR ONE-TWO LAMINECTOMY WITH MICRODISCECTOMY;  Surgeon: Lisbeth Renshaw, MD;  Location: [facility] OR;  Service: Neurosurgery;  Laterality: Right;  . LUMBAR LAMINECTOMY/DECOMPRESSION MICRODISCECTOMY N/A 12/06/2017   Procedure: RECURRENT MICRODISCECTOMY LUMBAR ONE - LUMBAR TWO;  Surgeon: Lisbeth Renshaw, MD;  Location: [facility] OR;  Service: Neurosurgery;  Laterality: N/A;  . LUMBAR WOUND DEBRIDEMENT N/A 12/11/2017   Procedure: LUMBAR WOUND DEBRIDEMENT;  Surgeon: Lisbeth Renshaw, MD;  Location: [facility] OR;  Service: Neurosurgery;  Laterality: N/A;  . POLYPECTOMY    . TONSILLECTOMY    . UPPER GASTROINTESTINAL ENDOSCOPY       Social History:   reports that he quit smoking about 31 years ago. His smoking use included cigarettes. He has a 20.00 pack-year smoking history. He has never used smokeless tobacco. He reports previous alcohol use. He reports that he does not use drugs.   Family History:  His family history includes Arthritis in his father and mother; Cancer in his maternal grandmother; Colon cancer in his father and paternal uncle; Colon polyps in his mother and sister; Dementia in his mother; Diabetes in his paternal uncle; Heart attack in his father; Melanoma in his mother and paternal uncle; Prostate cancer in his father; Skin cancer in his daughter; Stomach cancer in his paternal uncle. There is no history of Esophageal cancer or Rectal cancer.   Allergies Allergies  Allergen Reactions  . Cephalexin Shortness Of Breath, Rash and Other (See Comments)    Tolerated Augmentin 2015 and 2017 (12-2017). Tolerated nafcillin 12/2017 PATIENT HAS HAD A PCN REACTION WITH IMMEDIATE RASH, FACIAL/TONGUE/THROAT SWELLING, SOB, OR LIGHTHEADEDNESS WITH HYPOTENSION:  #  #  YES  #  #  Has patient had a PCN reaction causing severe rash involving mucus membranes or skin necrosis: No Has patient had a PCN reaction that required hospitalization: No Has patient had a PCN reaction occurring within the last 10 years: No If all of the above answers are "NO", then may proceed  .  Clarithromycin Shortness Of Breath and Rash  . Certolizumab Pegol Hives  . Tocilizumab Hives  . Doxycycline Rash  . Hydroxyzine Rash  . Lidoderm Other (See Comments)    "made me act  weird"  . Pyrithione Zinc Rash     Home Medications  Prior to Admission medications   Medication Sig Start Date End Date Taking? Authorizing Provider  acetaminophen (TYLENOL) 325 MG tablet Take 650 mg by mouth every 6 (six) hours as needed for headache, mild pain or fever.    [provider]  albuterol (VENTOLIN HFA) 108 (90 Base) MCG/ACT inhaler Inhale 2 puffs into the lungs every 4 (four) hours as needed for wheezing or shortness of breath. 05/18/20   Ghimire, Henreitta Leber, MD  APIXABAN Arne [place]) VTE STARTER PACK (10MG  AND 5MG ) Take as directed on package: start with two-5mg  tablets twice daily for 7 days. On day 8, switch to one-5mg  tablet twice daily. 05/18/20   Ghimire, Henreitta Leber, MD  budesonide-formoterol (SYMBICORT) 160-4.5 MCG/ACT inhaler Inhale 2 puffs into the lungs in the morning and at bedtime. 05/18/20 06/17/20  Ghimire, Henreitta Leber, MD  lactose free nutrition (BOOST PLUS) LIQD Take 237 mLs by mouth 3 (three) times daily with meals. 05/18/20 06/17/20  Ghimire, Henreitta Leber, MD  melatonin 5 MG TABS Take 5 mg by mouth at bedtime.    [provider]  Menthol (ICY HOT) 5 % PTCH Apply 1 patch topically 2 (two) times daily. Apply to lower back.    [provider]  metoprolol tartrate (LOPRESSOR) 25 MG tablet Take 1 tablet (25 mg total) by mouth 2 (two) times daily. 05/18/20   Ghimire, Henreitta Leber, MD  omeprazole (PRILOSEC) 40 MG capsule Take 1 capsule (40 mg total) by mouth daily. 05/18/20   Ghimire, Henreitta Leber, MD  predniSONE (DELTASONE) 10 MG tablet Take 3 tablets (30 mg total) by mouth every 12 (twelve) hours. 05/18/20   Ghimire, Henreitta Leber, MD  simvastatin (ZOCOR) 20 MG tablet Take 1 tablet (20 mg total) by mouth daily. 05/18/20   Ghimire, Henreitta Leber, MD  tamsulosin (FLOMAX) 0.4 MG CAPS capsule Take  1 capsule (0.4 mg total) by mouth daily. 05/19/20   Ghimire, Henreitta Leber, MD  Tiotropium Bromide Monohydrate (SPIRIVA RESPIMAT) 2.5 MCG/ACT AERS Inhale 2 puffs into the lungs daily. 05/18/20   Ghimire, Henreitta Leber, MD  Tiotropium Bromide Monohydrate (SPIRIVA RESPIMAT) 2.5 MCG/ACT AERS Inhale 2 puffs into the lungs daily. 05/24/20   Margaretha Seeds, MD     Signature:   Magdalen Spatz, MSN, AGACNP-BC [place] for personal pager PCCM on call pager [phone] Use only>> Not for outpatient use [facility] Personal contact information can be found on Amion  06/25/2020, 10:58 AM

## 2020-06-12 NOTE — Progress Notes (Signed)
Sent message to Dr. Nevada Crane:  Patient is diabetic and we do not have insulin orders.

## 2020-06-12 NOTE — H&P (Signed)
History and Physical    Tyrone Roman Tyrone Roman DOB: [birth year] DOA: 06/13/2020  PCP: [name], MD Consultants:  Palliative care; Chandresekar - cardiology; Trudie Reed - rheumatology; Nandigam - GI; Nundkumar - neurosurgery; Comer - ID Patient coming from:  Home - lives alone, has a caregiver who he hired last week; NOK: Daughter, Tyrone Roman, [phone]   Chief Complaint: SOB  HPI: Tyrone Roman is a 75 y.o. male with medical history significant of RA, CAD s/p PCI, HTN, HLD, COPD, BPH, and COVID in January 2022 with recurrent need for hospitalizations since.  Most recently, he was hospitalized from 4/20-28 with recurrent fever/SOB and worsening clot burden.  With pulm and ID consultation, he was treated with remdesivir, steroids, and Bebtelovimab.  Additionally, he had a breakthrough PE on Eliquis and was switched to Xarelto.  Interestingly, he has been tested for COVID and has tested positive on 1/24, 2/18, 4/4, (negative IgG Ab on 4/9), 4/20 (negative IgG Ab on 4/21), and 5/8; cycle counts have been consistently <30 indicating that he remains infectious and this is thought to be related to immunosuppression from RA, preventing him from the ability to clear the infection.  He was recommended to go to SNF and refused.  He returned today with increasing SOB despite home 3L [place] O2.  He reports that he is able to transfer and sit up in the chair but hasn't walked since January.  His symptoms were somewhat better at the time of last discharge but have been persistently present until overnight when he became acutely more SOB.  Some cough, but this is less prominent.  He feels like he is going to die from this.  He was wearing his usual 3L but increased his O2 to 5L without improvement.  He was as low as 71% while in the ER and is currently on 15L NRB O2.      ED Course:  Carryover, per Dr. Nevada Crane:  75 yo male with PMH covid-19 PNA, PE on Xarelto, presents with respiratory distress. CT shows  improvement of PE however he is requiring more O2 on 8L HFNC. Per EDP his mentation is ok. I have requested an arterial blood gas to further assess his level of hypoxemia and possibly hypercarbia.   Review of Systems: As per HPI; otherwise review of systems reviewed and negative.   Ambulatory Status:  Minimally ambulatory  COVID Vaccine Status:   Complete  Past Medical History:  Diagnosis Date  . Allergy   . Barrett's esophageal ulceration   . BPH (benign prostatic hyperplasia)   . CAD (coronary artery disease)    sees Dr. Mar Daring  . Cataract    both eyes removed   . Colonic polyp   . Concussion with loss of consciousness of 30 minutes or less   . Contact dermatitis   . Contact with or exposure to venereal diseases   . COPD (chronic obstructive pulmonary disease) ([place])    sees Dr. Kara Mead   . Diverticulosis of colon   . Dysphagia   . GERD (gastroesophageal reflux disease)    past hx- on meds   . HNP (herniated nucleus pulposus), lumbar    recurrent  . Hyperlipidemia   . Hypertension   . Myocardial infarction [facility])    2011- stent placed  . Nephrolithiasis   . Rheumatoid arthritis [facility])    sees Dr. Gavin Pound   . SOB (shortness of breath)     Past Surgical History:  Procedure Laterality Date  .  BACK SURGERY    . CARDIAC CATHETERIZATION N/A 01/19/2016   Procedure: Left Heart Cath and Coronary Angiography;  Surgeon: Burnell Blanks, MD;  Location: [place] [facility];  Service: Cardiovascular;  Laterality: N/A;  . COLONOSCOPY  12/09/2014   per Dr. Silverio Decamp, adenomatous polyps, repeat 3 years   . CORONARY ANGIOPLASTY WITH STENT PLACEMENT    . ESOPHAGOGASTRODUODENOSCOPY (EGD) WITH ESOPHAGEAL DILATION  02/17/09   Barretts esophagus   . EYE SURGERY    . KNEE ARTHROSCOPY Left X 2  . LUMBAR LAMINECTOMY/DECOMPRESSION MICRODISCECTOMY Right 10/07/2017   Procedure: RIGHT LUMBAR ONE-TWO LAMINECTOMY WITH MICRODISCECTOMY;  Surgeon: Consuella Lose, MD;  Location:  [place];  Service: Neurosurgery;  Laterality: Right;  . LUMBAR LAMINECTOMY/DECOMPRESSION MICRODISCECTOMY N/A 12/06/2017   Procedure: RECURRENT MICRODISCECTOMY LUMBAR ONE - LUMBAR TWO;  Surgeon: Consuella Lose, MD;  Location: [place];  Service: Neurosurgery;  Laterality: N/A;  . LUMBAR WOUND DEBRIDEMENT N/A 12/11/2017   Procedure: LUMBAR WOUND DEBRIDEMENT;  Surgeon: Consuella Lose, MD;  Location: [place];  Service: Neurosurgery;  Laterality: N/A;  . POLYPECTOMY    . TONSILLECTOMY    . UPPER GASTROINTESTINAL ENDOSCOPY      Social History   Socioeconomic History  . Marital status: Divorced    Spouse name: Not on file  . Number of children: 4  . Years of education: Not on file  . Highest education level: Not on file  Occupational History  . Occupation: ENGINEER    Employer: [employer]  Tobacco Use  . Smoking status: Former Smoker    Packs/day: 1.00    Years: 20.00    Pack years: 20.00    Types: Cigarettes    Quit date: 10/12/1988    Years since quitting: 31.6  . Smokeless tobacco: Never Used  . Tobacco comment: 1960's   Vaping Use  . Vaping Use: Never used  Substance and Sexual Activity  . Alcohol use: Not Currently    Alcohol/week: 0.0 standard drinks    Comment: rare  . Drug use: No  . Sexual activity: Not on file  Other Topics Concern  . 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       04/14/2018:   Lives alone, has girlfriend who visits, main source of emotional support   Has been struggling with recent hospitalizations/ill health following sepsis and back surgeries   Social Determinants of Health   Financial Resource Strain: Low Risk   . Difficulty of Paying Living Expenses: Not hard at all  Food Insecurity: No Food Insecurity  . Worried About Charity fundraiser in the Last Year: Never true  . Ran Out of Food in the Last Year: Never true  Transportation Needs: No  Transportation Needs  . Lack of Transportation (Medical): No  . Lack of Transportation (Non-Medical): No  Physical Activity: Insufficiently Active  . Days of Exercise per Week: 2 days  . Minutes of Exercise per Session: 60 min  Stress: Stress Concern Present  . Feeling of Stress : To some extent  Social Connections: Moderately Isolated  . Frequency of Communication with Friends and Family: More than three times a week  . Frequency of Social Gatherings with Friends and Family: Once a week  . Attends Religious Services: Never  . Active Member of Clubs or Organizations: Yes  . Attends Archivist Meetings: More than 4 times per year  . Marital Status: Divorced  Human resources officer  Violence: Not At Risk  . Fear of Current or Ex-Partner: No  . Emotionally Abused: No  . Physically Abused: No  . Sexually Abused: No    Allergies  Allergen Reactions  . Cephalexin Shortness Of Breath, Rash and Other (See Comments)    Tolerated Augmentin 2015 and 2017 (12-2017). Tolerated nafcillin 12/2017 PATIENT HAS HAD A PCN REACTION WITH IMMEDIATE RASH, FACIAL/TONGUE/THROAT SWELLING, SOB, OR LIGHTHEADEDNESS WITH HYPOTENSION:  #  #  YES  #  #  Has patient had a PCN reaction causing severe rash involving mucus membranes or skin necrosis: No Has patient had a PCN reaction that required hospitalization: No Has patient had a PCN reaction occurring within the last 10 years: No If all of the above answers are "NO", then may proceed  . Clarithromycin Shortness Of Breath and Rash  . Certolizumab Pegol Hives  . Tocilizumab Hives  . Doxycycline Rash  . Hydroxyzine Rash  . Lidoderm Other (See Comments)    "made me act weird"  . Pyrithione Zinc Rash    Family History  Problem Relation Age of Onset  . Heart attack Father        cardiovascular disorder  . Arthritis Father        family hx  . Colon cancer Father        mets  . Prostate cancer Father        1st degree relative  . Arthritis Mother    . Colon polyps Mother   . Melanoma Mother   . Dementia Mother   . Diabetes Paternal Uncle   . Colon cancer Paternal Uncle   . Stomach cancer Paternal Uncle   . Melanoma Paternal Uncle   . Cancer Maternal Grandmother   . Skin cancer Daughter   . Colon polyps Sister   . Esophageal cancer Neg Hx   . Rectal cancer Neg Hx     Prior to Admission medications   Medication Sig Start Date End Date Taking? Authorizing Provider  Accu-Chek Softclix Lancets lancets Use as directed. 06/02/20   Thurnell Lose, MD  acetaminophen (TYLENOL) 325 MG tablet Take 650 mg by mouth every 6 (six) hours as needed for headache, mild pain or fever.    [provider]  albuterol (VENTOLIN HFA) 108 (90 Base) MCG/ACT inhaler Inhale 2 puffs into the lungs every 4 (four) hours as needed for wheezing or shortness of breath. 05/18/20   Ghimire, Henreitta Leber, MD  Blood Glucose Monitoring Suppl (ACCU-CHEK GUIDE) w/Device KIT Use as directed 06/02/20   Thurnell Lose, MD  budesonide-formoterol [facility] 160-4.5 MCG/ACT inhaler Inhale 2 puffs into the lungs in the morning and at bedtime. 05/18/20 06/17/20  Ghimire, Henreitta Leber, MD  glucose blood (ACCU-CHEK GUIDE) test strip Use as instructed up to 4 times daily 06/02/20   Thurnell Lose, MD  insulin glargine (LANTUS) 100 UNIT/ML Solostar Pen Inject 15 Units into the skin at bedtime. 06/02/20   Thurnell Lose, MD  insulin lispro (HUMALOG KWIKPEN) 100 UNIT/ML KwikPen Before each meal 3 times a day, 140-199 - 2 units, 200-250 - 4 units, 251-299 - 6 units,  300-349 - 8 units,  350 or above 10 units. Patient taking differently: Before each meal 3 times a day, 140-199 - 2 units, 200-250 - 4 units, 251-299 - 6 units,  300-349 - 8 units,  350 or above 10 units. 06/02/20   Thurnell Lose, MD  Insulin Pen Needle 32G X 4 MM MISC use as directed with insulin  pens Patient taking differently: use as directed with insulin pens 06/02/20   Thurnell Lose, MD  lactose free  nutrition (BOOST PLUS) LIQD Take 237 mLs by mouth 3 (three) times daily with meals. 05/18/20 06/17/20  Ghimire, Henreitta Leber, MD  melatonin 5 MG TABS Take 5 mg by mouth at bedtime as needed (sleep).    [provider]  Menthol (ICY HOT) 5 % PTCH Apply 1 patch topically 2 (two) times daily as needed (back pain). Apply to lower back.    [provider]  metoprolol tartrate (LOPRESSOR) 50 MG tablet Take 1 tablet (50 mg total) by mouth 2 (two) times daily. 06/02/20   Thurnell Lose, MD  nystatin (MYCOSTATIN) 100000 UNIT/ML suspension Take 5 mLs (500,000 Units total) by mouth 4 (four) times daily. 06/08/20   [name], MD  omeprazole (PRILOSEC) 40 MG capsule Take 1 capsule (40 mg total) by mouth daily. 05/18/20   Ghimire, Henreitta Leber, MD  predniSONE (DELTASONE) 10 MG tablet Take 3 tablets (30 mg total) by mouth every 12 (twelve) hours. 05/18/20   Ghimire, Henreitta Leber, MD  Rivaroxaban (XARELTO) 15 MG TABS tablet Take 1 tablet (15 mg total) by mouth 2 (two) times daily with a meal for 13 days. 06/02/20 06/15/20  Thurnell Lose, MD  rivaroxaban (XARELTO) 20 MG TABS tablet Take 1 tablet (20 mg total) by mouth daily with supper. Start after completing Xarelto 54m twice daily tablets 06/16/20   SThurnell Lose MD  simvastatin (ZOCOR) 20 MG tablet Take 1 tablet (20 mg total) by mouth daily. 05/18/20   Ghimire, SHenreitta Leber MD  sulfamethoxazole-trimethoprim (BACTRIM DS) 800-160 MG tablet Take 1 tablet by mouth 3 (three) times a week. 06/03/20   SThurnell Lose MD  tamsulosin (FLOMAX) 0.4 MG CAPS capsule Take 1 capsule (0.4 mg total) by mouth daily. 05/19/20   Ghimire, SHenreitta Leber MD  Tiotropium Bromide Monohydrate (SPIRIVA RESPIMAT) 2.5 MCG/ACT AERS Inhale 2 puffs into the lungs daily. 05/18/20   GJonetta Osgood MD    Physical Exam: Vitals:   06/09/2020 0645 06/27/2020 0725 06/28/2020 0917 06/23/2020 0946  BP: 131/87 135/86 (!) 128/91 107/60  Pulse: 96 94 99 93  Resp: (!) '24 20 18 19  ' Temp:  97.9 F  (36.6 C) (!) 97.4 F (36.3 C) (!) 97.2 F (36.2 C)  TempSrc:  Axillary Axillary Axillary  SpO2: 98% 100% 99% 98%      General:  Appears chronically ill  Eyes:  PERRL, EOMI, normal lids, iris  ENT:  hard of hearing; erythema and plaques on tongue with buccal mucosal excoriations noted  Neck:  no LAD, masses or thyromegaly  Cardiovascular:  RRR, no m/r/g. 2-3+ LE edema.   Respiratory:   Bibasilar rhonchi.  Mildly increased respiratory effort. On NRB O2.  Abdomen:  soft, NT, ND  Skin:  no rash or induration seen on limited exam  Musculoskeletal:  mildly decreased tone BUE/BLE, good ROM, no bony abnormality  Psychiatric:  blunted mood and affect with some emotional lability, speech fluent and appropriate, AOx3  Neurologic:  CN 2-12 grossly intact, moves all extremities in coordinated fashion .     Radiological Exams on Admission: Independently reviewed - see discussion in A/P where applicable  CT ANGIO CHEST PE W OR WO CONTRAST  Result Date: 06/07/2020 CLINICAL DATA:  I more foci of EXAM: CT ANGIOGRAPHY CHEST WITH CONTRAST TECHNIQUE: Multidetector CT imaging of the chest was performed using the standard protocol during bolus administration of intravenous  contrast. Multiplanar CT image reconstructions and MIPs were obtained to evaluate the vascular anatomy. CONTRAST:  41m OMNIPAQUE IOHEXOL 350 MG/ML SOLN COMPARISON:  CT angiography 05/14/2020, CT angiography 03/26/2020, CT angiography 11/27/2010 FINDINGS: Cardiovascular: Satisfactory opacification of the pulmonary arteries to the segmental level. Filling defect in the right lower lobe segmental pulmonary artery (5:52). No central pulmonary embolus. No definite left pulmonary embolus. Left ventricular hypertrophy. No pericardial effusion. Four-vessel coronary artery calcification. Mediastinum/Nodes: No enlarged mediastinal, hilar, or axillary lymph nodes. Thyroid gland, trachea, and esophagus demonstrate no significant findings.  Lungs/Pleura: Redemonstration of cystic changes that are more apparent within the upper lobes likely related to emphysematous changes. Interlobular septal wall thickening. Redemonstration of peribronchovascular ground-glass airspace opacities and consolidation with associated bronchiectasis. No pleural effusion. No pneumothorax. Upper Abdomen: No acute abnormality. Musculoskeletal: No abdominal wall hernia or abnormality. No suspicious lytic or blastic osseous lesions. No acute displaced fracture. Multilevel degenerative changes of the spine. Review of the MIP images confirms the above findings. IMPRESSION: 1. Residual segmental right lower lobe pulmonary embolus with interval resolution of the central pulmonary embolus component. No associated heart strain. No definite pulmonary infarction. 2. Diffuse pulmonary findings similar to at least as far back as 03/16/2020. Findings could be infectious/inflammatory. Underlying malignancy not excluded. 3. Left ventricular hypertrophy. 4. Aortic Atherosclerosis (ICD10-I70.0) and Emphysema (ICD10-J43.9). 5. Four-vessel coronary artery calcifications. These results were called by telephone at the time of interpretation on 06/13/2020 at 5:36 am [facility], who verbally acknowledged these results. Electronically Signed   By: MIven FinnM.D.   On: 06/27/2020 05:41   DG Chest Port 1 View  Result Date: 07/03/2020 CLINICAL DATA:  Sepsis EXAM: PORTABLE CHEST 1 VIEW COMPARISON:  05/30/2020 FINDINGS: Bilateral interstitial opacities, unchanged. Normal cardiomediastinal contours. No pneumothorax or sizable pleural effusion. IMPRESSION: Unchanged bilateral interstitial opacities. Electronically Signed   By: KUlyses JarredM.D.   On: 07/01/2020 03:48    EKG: Independently reviewed.   0313 - Sinus tachycardia with rate 129;RBBB, LAFB 0640 - NSR with rate 99; RBBB, LAFB; nonspecific ST changes with concern for ischemia   Labs on Admission: I have  personally reviewed the available labs and imaging studies at the time of the admission.  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   Pulmonary embolus (HCC)   SIRS (systemic inflammatory response syndrome) (HCC)   Thrush   Goals of care, counseling/discussion   Recurrent respiratory failure with hypoxia in the setting of refractory COVID-19 infection -This is his 7th hospitalization since he was diagnosed with COVID (post-vaccination in January) -He reports that he never fully improved but his symptoms worsened overnight with SOB -He wears 3L [place] O2, increased to 5L without improvement -He was on NRB O2 at the time of my evaluation -SIRS criteria in this patient includes: Tachycardia, tachypnea, hypoxia  -Patient has evidence of acute organ failure with elevated lactate >2 that is not easily explained by another condition. -While awaiting blood cultures, this appears to be a preseptic condition. -Sepsis protocol initiated -Suspected source  is PNA (see below) -Blood and urine cultures pending -Will admit due to: hypoxemia; will monitor on Progressive care for now -Treat with antibiotics as recommended per ID consult -Pulm consult also requested -Will trend lactate to ensure improvement -Will order procalcitonin level.  Antibiotics would not be indicated for PCT <0.1 and probably should not be used for < 0.25.  >0.5 indicates infection and >>0.5 indicates more serious disease.   As the procalcitonin level normalizes, it will be reasonable to consider de-escalation of antibiotic coverage.  Post-COVID PNA with underlying COPD -Patient with prior COVID infection, initially + on 1/24 -He reports persistent cough, congestion -He has had significant cycle threshold on repeated retesting indicating that he remains infectious from [place] (thought to be related to immunosuppression from RA) -Has been treated with remdesivir (4/21) and MAB therapy (4/22) recently -Imaging indicates persistent lung infection vs inflammation vs possible underlying malignancy -?need for bronch -Pulmonology consult requested -Continue Symbicort (Oxford substitution), Incruse (formulary substitution for Sprivia), Albuterol -Previously started on Bactrim and will continue this pending ID input  PE -Developed PE and then had breakthrough worsening on Eliquis and so was switched to Xarelto -PE appears to be responding to Xarelto and with lower current clot burden  Thrush -Prior sputum culture with Candida glabrata that ID/pulm did not recommend treating -He reports persistent thrush and clearly has tongue excoriations at this time -Will order IV Diflucan while awaiting ID/pulm input  RA -Has been on Rituxin and reports increased dose prior to development of COVID -Also on chronic steroids so with adrenal insufficiency -Will give stress-dosed hydrocortisone for now  CAD -Continue ASA -Will need outpatient cardiology f/u unless acute issues arise while hospitalized  HTN -Continue Lopressor  HLD -Continue Zocor  DM -Will check A1c -Continue Lantus -Cover with moderate-scale SSI -May utilize continuous glucose monitoring  Goals of care -Previously went to SNF but declined after recent admission -He has home caregiver -Reports that he feels like he is dying but does not want to die -Full code -Consider palliative care consultation    Note: This patient was  positive for the novel coronavirus COVID-19 infection within the last 90 days and remains positive. He has been fully vaccinated against COVID-19.   Level of care: Progressive DVT prophylaxis:Xarelto Code Status:Full - confirmed with patient Family Communication:None present; he declined to have me call his daughter at the time of admission. Disposition Plan:The patient is from: home             Anticipated d/c is to: home with [facility] services - he reports that he has arranged for home assistance and wishes to return home following d/c.  TOC consult requested.             Anticipated d/c date will depend on clinical response to treatment, likely prolonged hospitalization             Patient is currently: acutely ill Consults called:Pulmonology; ID; PT/OT/Nutrition/TOC team Admission status:Admit - It is my clinical opinion that admission to INPATIENTis reasonable and necessary because of the expectation that this patient will require hospital care that crosses at least 2 midnights to treat this condition based on the medical complexity of the problems presented. Given the aforementioned information, the predictability of an adverse outcome is felt to be significant.    Karmen Bongo MD [facility]   How to contact the [facility] Attending or Consulting provider [place] or covering provider during after hours [place], for this patient?  1.  Check the care team in [facility] and look for a) attending/consulting [facility] provider listed and b) the [facility] team listed 2. Log into [email or web address] and use [place]'s universal password to access. If you do not have the password, please contact the hospital operator. 3. Locate the [facility] provider you are looking for under [facility] and page to a number that you can be directly reached. 4. If you still have difficulty reaching the provider, please page the [facility] (Director on Call) for the Hospitalists listed on amion for assistance.   07/01/2020, 2:09  PM

## 2020-06-12 NOTE — ED Triage Notes (Signed)
Pt BIB EMS from home due to increaseing sob.Pt dx w/ covid in jan.Pt reports he wears 3L [place] but today he had to go up to 5L. Pt is on NRB at this time . Pt sp02 98-100/.

## 2020-06-12 NOTE — Progress Notes (Addendum)
PCCM note  Discussed with blood bank director Dr. Claudette Laws High titer convalescent plasma is available from [facility] We will place an order for 2 units which may take a couple of hours for delivery Lasix 40 mg IV with plasma to prevent volume overload  I discussed with patient regarding side effects and that this therapy is not FDA approved with unclear benefit in his situation.  He wants to proceed with the convalescent plasma infusion.  Bedside nurse updated  Marshell Garfinkel MD [place] See Amion for pager  If no response to pager , please call [phone] until 7pm After 7:00 pm call Elink  [phone] 06/27/2020, 5:17 PM

## 2020-06-13 ENCOUNTER — Inpatient Hospital Stay (HOSPITAL_COMMUNITY): Payer: Medicare Other

## 2020-06-13 DIAGNOSIS — D849 Immunodeficiency, unspecified: Secondary | ICD-10-CM | POA: Diagnosis not present

## 2020-06-13 DIAGNOSIS — U071 COVID-19: Secondary | ICD-10-CM | POA: Diagnosis not present

## 2020-06-13 DIAGNOSIS — J9621 Acute and chronic respiratory failure with hypoxia: Secondary | ICD-10-CM

## 2020-06-13 DIAGNOSIS — J9601 Acute respiratory failure with hypoxia: Secondary | ICD-10-CM | POA: Diagnosis not present

## 2020-06-13 DIAGNOSIS — I10 Essential (primary) hypertension: Secondary | ICD-10-CM | POA: Diagnosis not present

## 2020-06-13 DIAGNOSIS — J1282 Pneumonia due to coronavirus disease 2019: Secondary | ICD-10-CM

## 2020-06-13 DIAGNOSIS — I2699 Other pulmonary embolism without acute cor pulmonale: Secondary | ICD-10-CM

## 2020-06-13 LAB — RESPIRATORY PANEL BY PCR

## 2020-06-13 LAB — COMPREHENSIVE METABOLIC PANEL
ALT: 40 U/L (ref 0–44)
AST: 32 U/L (ref 15–41)
Albumin: 2.1 g/dL — ABNORMAL LOW (ref 3.5–5.0)
Alkaline Phosphatase: 75 U/L (ref 38–126)
Anion gap: 7 (ref 5–15)
BUN: 29 mg/dL — ABNORMAL HIGH (ref 8–23)
CO2: 25 mmol/L (ref 22–32)
Calcium: 9.1 mg/dL (ref 8.9–10.3)
Chloride: 97 mmol/L — ABNORMAL LOW (ref 98–111)
Creatinine, Ser: 0.92 mg/dL (ref 0.61–1.24)
GFR, Estimated: >60 mL/min (ref >60)
Glucose, Bld: 210 mg/dL — ABNORMAL HIGH (ref 70–99)
Potassium: 3.9 mmol/L (ref 3.5–5.1)
Sodium: 129 mmol/L — ABNORMAL LOW (ref 135–145)
Total Bilirubin: 0.5 mg/dL (ref 0.3–1.2)
Total Protein: 5 g/dL — ABNORMAL LOW (ref 6.5–8.1)

## 2020-06-13 LAB — BLOOD GAS, ARTERIAL
Acid-base deficit: 1.6 mmol/L (ref 0.0–2.0)
Bicarbonate: 21.1 mmol/L (ref 20.0–28.0)
Drawn by: 59133
FIO2: 100
O2 Saturation: 87.2 %
Patient temperature: 38.8
pCO2 arterial: 29.2 mmHg — ABNORMAL LOW (ref 32.0–48.0)
pH, Arterial: 7.48 — ABNORMAL HIGH (ref 7.350–7.450)
pO2, Arterial: 58.8 mmHg — ABNORMAL LOW (ref 83.0–108.0)

## 2020-06-13 LAB — CBC WITH DIFFERENTIAL/PLATELET
Abs Immature Granulocytes: 0.09 10*3/uL — ABNORMAL HIGH (ref 0.00–0.07)
Basophils Absolute: 0 10*3/uL (ref 0.0–0.1)
Basophils Relative: 0 %
Eosinophils Absolute: 0 10*3/uL (ref 0.0–0.5)
Eosinophils Relative: 0 %
HCT: 39.8 % (ref 39.0–52.0)
Hemoglobin: 12.7 g/dL — ABNORMAL LOW (ref 13.0–17.0)
Immature Granulocytes: 1 %
Lymphocytes Relative: 1 %
Lymphs Abs: 0.1 10*3/uL — ABNORMAL LOW (ref 0.7–4.0)
MCH: 28.6 pg (ref 26.0–34.0)
MCHC: 31.9 g/dL (ref 30.0–36.0)
MCV: 89.6 fL (ref 80.0–100.0)
Monocytes Absolute: 0.1 10*3/uL (ref 0.1–1.0)
Monocytes Relative: 2 %
Neutro Abs: 7 10*3/uL (ref 1.7–7.7)
Neutrophils Relative %: 96 %
Platelets: 74 10*3/uL — ABNORMAL LOW (ref 150–400)
RBC: 4.44 MIL/uL (ref 4.22–5.81)
RDW: 19.9 % — ABNORMAL HIGH (ref 11.5–15.5)
WBC: 7.3 10*3/uL (ref 4.0–10.5)
nRBC: 0 % (ref 0.0–0.2)

## 2020-06-13 LAB — FERRITIN: Ferritin: 1058 ng/mL — ABNORMAL HIGH (ref 24–336)

## 2020-06-13 LAB — MAGNESIUM: Magnesium: 1.8 mg/dL (ref 1.7–2.4)

## 2020-06-13 LAB — GLUCOSE, CAPILLARY: Glucose-Capillary: 201 mg/dL — ABNORMAL HIGH (ref 70–99)

## 2020-06-13 LAB — C-REACTIVE PROTEIN: CRP: 8.6 mg/dL — ABNORMAL HIGH (ref <1.0)

## 2020-06-13 LAB — PHOSPHORUS: Phosphorus: 3.6 mg/dL (ref 2.5–4.6)

## 2020-06-13 LAB — LACTIC ACID, PLASMA: Lactic Acid, Venous: 3 mmol/L (ref 0.5–1.9)

## 2020-06-13 LAB — D-DIMER, QUANTITATIVE: D-Dimer, Quant: 0.58 ug/mL-FEU — ABNORMAL HIGH (ref 0.00–0.50)

## 2020-06-13 MED ORDER — HYDROCORTISONE NA SUCCINATE PF 100 MG IJ SOLR
100.0000 mg | Freq: Three times a day (TID) | INTRAMUSCULAR | Status: DC
  Administered 2020-06-13 – 2020-06-14 (×3): 100 mg via INTRAVENOUS
  Filled 2020-06-13 (×3): qty 2

## 2020-06-13 MED ORDER — DEXAMETHASONE SODIUM PHOSPHATE 10 MG/ML IJ SOLN
6.0000 mg | Freq: Four times a day (QID) | INTRAMUSCULAR | Status: DC
  Administered 2020-06-13 (×3): 6 mg via INTRAVENOUS
  Filled 2020-06-13 (×2): qty 1

## 2020-06-13 MED ORDER — ENSURE ENLIVE PO LIQD
237.0000 mL | Freq: Three times a day (TID) | ORAL | Status: DC
  Administered 2020-06-13 – 2020-06-16 (×7): 237 mL via ORAL

## 2020-06-13 MED ORDER — INSULIN ASPART 100 UNIT/ML IJ SOLN
0.0000 [IU] | Freq: Every day | INTRAMUSCULAR | Status: DC
  Administered 2020-06-13: 5 [IU] via SUBCUTANEOUS

## 2020-06-13 MED ORDER — INSULIN ASPART 100 UNIT/ML IJ SOLN: 0.0000 [IU] | Freq: Three times a day (TID) | INTRAMUSCULAR | Status: DC

## 2020-06-13 MED ORDER — MIDODRINE HCL 5 MG PO TABS
5.0000 mg | ORAL_TABLET | Freq: Three times a day (TID) | ORAL | Status: DC
  Administered 2020-06-13 – 2020-06-16 (×8): 5 mg via ORAL
  Filled 2020-06-13 (×9): qty 1

## 2020-06-13 MED ORDER — MENTHOL 3 MG MT LOZG
1.0000 | LOZENGE | OROMUCOSAL | Status: DC | PRN
  Administered 2020-06-13: 3 mg via ORAL
  Filled 2020-06-13: qty 9

## 2020-06-13 MED ORDER — FUROSEMIDE 10 MG/ML IJ SOLN
40.0000 mg | Freq: Once | INTRAMUSCULAR | Status: AC
  Administered 2020-06-13: 40 mg via INTRAVENOUS
  Filled 2020-06-13: qty 4

## 2020-06-13 MED ORDER — CHLORHEXIDINE GLUCONATE CLOTH 2 % EX PADS
6.0000 | MEDICATED_PAD | Freq: Every day | CUTANEOUS | Status: DC
  Administered 2020-06-14 – 2020-06-16 (×3): 6 via TOPICAL

## 2020-06-13 MED ORDER — ADULT MULTIVITAMIN W/MINERALS CH
1.0000 | ORAL_TABLET | Freq: Every day | ORAL | Status: DC
  Administered 2020-06-14 – 2020-06-16 (×3): 1 via ORAL
  Filled 2020-06-13 (×4): qty 1

## 2020-06-13 MED ORDER — SODIUM CHLORIDE 0.9 % IV SOLN
200.0000 mg | Freq: Once | INTRAVENOUS | Status: AC
  Administered 2020-06-13: 200 mg via INTRAVENOUS
  Filled 2020-06-13: qty 40

## 2020-06-13 MED ORDER — SODIUM CHLORIDE 0.9 % IV BOLUS
500.0000 mL | Freq: Once | INTRAVENOUS | Status: AC
  Administered 2020-06-13: 500 mL via INTRAVENOUS

## 2020-06-13 MED ORDER — SODIUM CHLORIDE 0.9 % IV SOLN
100.0000 mg | Freq: Every day | INTRAVENOUS | Status: DC
  Administered 2020-06-14 – 2020-06-16 (×3): 100 mg via INTRAVENOUS
  Filled 2020-06-13 (×3): qty 20

## 2020-06-13 NOTE — Progress Notes (Signed)
Cross-coverage note:   Tyrone Roman was seen for increasing oxygen requirements.   HPI: Tyrone Roman is a 53 yom with RA on Rituxan, COPD, CAD, PE on Xarelto, admission in January for COVID and multiple subsequent admissions for respiratory failure suspected secondary to recurrent or persistent COVID-19 infection who was admitted yesterday with acute on chronic hypoxic respiratory failure and has had increasing oxygen requirements, now saturating high 80s-low 90s on 50 Lpm heated HF + 15 Lpm NRB.   CTA chest in ED yesterday demonstrates residual RLL PE with interval resolution of central PE component, emphysematous changes, and persistent diffuse ground-glass airspace opacities.   Tyrone Roman has been started on systemic steroids and treated with convalescent plasma overnight.   S:  "I don't feel good," unable to be more specific, denies chest pain or SOB but reports palpitations.   O: Alert, no cyanosis or diaphoresis. RR mid-20s, speaking full sentences, sat upper 80s, speaking full sentences, HR ~120 and regular, no peripheral edema.   A&P: Acute on chronic hypoxic respiratory failure with rapidly increasing supplemental O2 requirements. Does not appear hypervolemic, no acute PE on CTA yesterday, low procalcitonin, repeat ABG pending. May benefit from BiPAP, discussed with PCCM, appreciate their expertise, will follow-up on recommendations.

## 2020-06-13 NOTE — Progress Notes (Signed)
PT Cancellation Note  Patient Details Name: DYKE WEIBLE MRN: [id number] DOB: Dec 12, 1945   Cancelled Treatment:    Reason Eval/Treat Not Completed: Medical issues which prohibited therapy discussed case with RN, who requests we hold PT for today as he is still medically unstable. Will check back on next date of service.    [name], DPT, PN1   Supplemental Physical Therapist [facility]    Pager [phone] Acute Rehab Office [phone]

## 2020-06-13 NOTE — Significant Event (Signed)
OVERNIGHT COVERAGE CRITICAL CARE PROGRESS NOTE  Called to see patient regarding increasing oxygen requirement.  At the time of clinical interview, the patient is on 40 LPM via high flow nasal cannula.  SPO2 varies from 84 to 89%.  Patient is awake and surprisingly reports minimal respiratory distress.  He denies fatigue.  He is noted to be tachycardic and hypertensive.  Nurse reports that the patient received 2 units of convalescent plasma overnight.  He also did get 1 dose of Lasix at midnight.  Urine output is estimated to be over 300 mL.  He had a condom catheter earlier but he voided into the bed sheets.  300 mL collected in the bag.  On exam, the patient is noted to have diffuse bilateral crackles, which were previously described as being bibasilar.  The patient has COVID-19 pneumonia.  He is he has a history of rheumatoid arthritis, for which she was apparently previously treated with rituximab.  He has a history of pulmonary embolism in April 2022.  He had a chest CT on 2020/07/10 which to diffuse bilateral patchy densities which may be worse in the right mid lung but otherwise not significantly changed when compared to previous imaging obtained on 05/25/2020.  ARDS secondary to COVID-19 pneumonia with acute worsening of hypxia  Titrate HFNC to try to maintain SpO2 > 90%.  Furosemide 40 mg IV single dose now.  Hold Solu-Cortef.  Start Decadron 6 mg IV every 6 hours.  Foley catheter insertion for I/O.  Patient's codes status is noted to be DNR/DNI.  Renee Pain, MD Board Certified by the ABIM, [place]

## 2020-06-13 NOTE — Progress Notes (Signed)
Red MEWS @ 1505. Charge nurse and MD notified. Will continue to monitor patient.

## 2020-06-13 NOTE — Progress Notes (Signed)
Inpatient Diabetes Program Recommendations  AACE/ADA: New Consensus Statement on Inpatient Glycemic Control (2015)  Target Ranges:  Prepandial:   less than 140 mg/dL      Peak postprandial:   less than 180 mg/dL (1-2 hours)      Critically ill patients:  140 - 180 mg/dL   Lab Results  Component Value Date   GLUCAP 201 (H) 06/13/2020   HGBA1C 6.3 (H) 05/12/2020    Review of Glycemic Control  Diabetes history: None Outpatient Diabetes medications: None Current orders for Inpatient glycemic control: Lantus 15 units QHS  HgbA1C - 6.3% - prediabetes  Inpatient Diabetes Program Recommendations:     Add Novolog 0-9 units TID with meals and 0-5 HS\  Continue to follow.  Thank you. Lorenda Peck, RD, LDN, CDE Inpatient Diabetes Coordinator [phone]

## 2020-06-13 NOTE — Progress Notes (Signed)
Initial Nutrition Assessment  DOCUMENTATION CODES:   Not applicable  INTERVENTION:   -Obtain new wt -D/c Boost Plus -Ensure Enlive po TID, each supplement provides 350 kcal and 20 grams of protein -Magic cup TID with meals, each supplement provides 290 kcal and 9 grams of protein -MVI with minerals daily  NUTRITION DIAGNOSIS:   Increased nutrient needs related to acute illness (COVID-19) as evidenced by estimated needs.  GOAL:   Patient will meet greater than or equal to 90% of their needs  MONITOR:   PO intake,Supplement acceptance,Diet advancement,Labs,Weight trends,Skin,I & O's  REASON FOR ASSESSMENT:   Consult Assessment of nutrition requirement/status  ASSESSMENT:   Tyrone Roman is a 75 y.o. male with medical history significant of RA, CAD s/p PCI, HTN, HLD, COPD, BPH, and COVID in January 2022 with recurrent need for hospitalizations since  Pt admitted with ARDS with COVID-19 pneumonia.   Reviewed I/O's: +139 ml x 24 hours  UOP: 375 ml x 24 hours  Pt unavailable at time of visit. Attempted to speak with pt via call to hospital room phone, however, unable to reach. RD unable to obtain further nutrition-related history or complete nutrition-focused physical exam at this time.   Pt currently on a carb modified diet. No meal completion data available to assess at this time.   Reviewed wt hx; ppt has experienced a 10.2% wt loss over the past 3 months, which is significant for time frame. Pt is at high risk for malnutrition, however, unable to identify at this time.   Pt with increased nutritional needs and would benefit from nutrient dense supplement. One Ensure Enlive supplement provides 350 kcals, 20 grams protein, and 44-45 grams of carbohydrate vs one Glucerna shake supplement, which provides 220 kcals, 10 grams of protein, and 26 grams of carbohydrate. Given pt's hx of DM, RD will reassess adequacy of PO intake, CBGS, and adjust supplement regimen as appropriate  at follow-up.   Medications reviewed and include vitamin C, decadron, colace, and zinc sulfate.   Lab Results  Component Value Date   HGBA1C 6.3 (H) 05/12/2020   PTA DM medications are 15 units insulin glargine daily, sliding scale insulin lispro TID with meals.   Labs reviewed: Na: 129, CBGS: 201 (inpatient orders for glycemic control are 15 units insulin glargine daily at bedtime).   Diet Order:   Diet Order            Diet Carb Modified Fluid consistency: Thin; Room service appropriate? Yes  Diet effective now                 EDUCATION NEEDS:   No education needs have been identified at this time  Skin:  Skin Assessment: Reviewed RN Assessment  Last BM:  06-27-2020  Height:   Ht Readings from Last 1 Encounters:  05/25/20 6\' 2"  (1.88 m)    Weight:   Wt Readings from Last 1 Encounters:  05/25/20 88 kg    Ideal Body Weight:  86.4 kg  BMI:  There is no height or weight on file to calculate BMI.  Estimated Nutritional Needs:   Kcal:  2400-2600  Protein:  130-145 grams  Fluid:  > 2 L    Loistine Chance, RD, LDN, [place] Registered Dietitian II Certified Diabetes Care and Education Specialist Please refer to [facility] for RD and/or RD on-call/weekend/after hours pager

## 2020-06-13 NOTE — Progress Notes (Addendum)
Pt's O2 demands increasing. Pt unable to maintain saturations on 15L NRB and 15L Highflow.  RT called to assess, MD Opyd notified.

## 2020-06-13 NOTE — Progress Notes (Signed)
ABG collected and send down to the lab, called the lab.

## 2020-06-13 NOTE — Progress Notes (Signed)
Patient having desaturation issues while on 100% NRB and 15LPM high flow cannula. Placed patient on heated high flow cannula at 40LPM and 100% along with NRB mask. Breath sounds fine crackles in LLL and decreased in RLL. Patient stated he was in no respiratory distress at this time.

## 2020-06-13 NOTE — Progress Notes (Signed)
PROGRESS NOTE                                                                             PROGRESS NOTE                                                                                                                                                                                                             Patient Demographics:    Tyrone Roman, is a 75 y.o. male, DOB - 1945-11-21, [id number]  Outpatient Primary MD for the patient is [name], MD    LOS - 1  Admit date - 06/27/2020    Chief Complaint  Patient presents with  . Shortness of Breath       Brief Narrative    HPI: Tyrone Roman is a 75 y.o. male with medical history significant of RA, CAD s/p PCI, HTN, HLD, COPD, BPH, and COVID in January 2022 with recurrent need for hospitalizations since.  Most recently, he was hospitalized from 4/20-28 with recurrent fever/SOB and worsening clot burden.  With pulm and ID consultation, he was treated with remdesivir, steroids, and Bebtelovimab.  Additionally, he had a breakthrough PE on Eliquis and was switched to Xarelto.  Interestingly, he has been tested for COVID and has tested positive on 1/24, 2/18, 4/4, (negative IgG Ab on 4/9), 4/20 (negative IgG Ab on 4/21), and 5/8; cycle counts have been consistently <30 indicating that he remains infectious and this is thought to be related to immunosuppression from RA, preventing him from the ability to clear the infection.  He was recommended to go to SNF and refused.  He returned today with increasing SOB despite home 3L [place] O2.  He reports that he is able to transfer and sit up in the chair but hasn't walked since January.  His symptoms were somewhat better at the time of last discharge but have been persistently present until overnight when he became acutely more SOB.  Some cough, but this is less prominent.  He feels like he is going  to die from this.  He was wearing his usual 3L but increased his O2 to 5L  without improvement.  He was as low as 71% while in the ER and is currently on 15L NRB O2.      ED Course:  Carryover, per Dr. Nevada Crane:  75 yo male with PMH covid-19 PNA, PE on Xarelto, presents with respiratory distress. CT shows improvement of PE however he is requiring more O2 on 8L HFNC. Per EDP his mentation is ok. I have requested an arterial blood gas to further assess his level of hypoxemia and possibly hypercarbia.    Subjective:    Tyrone Roman today some increased work of breathing, fatigue, had respiratory distress overnight after receiving his convalescent plasma transfusion, improved with Lasix.     Assessment  & Plan :    Principal Problem:   Acute respiratory failure with hypoxia (HCC) Active Problems:   Hyperlipidemia   COPD (chronic obstructive pulmonary disease) (HCC)   Essential hypertension   Post-COVID-19 syndrome manifesting as chronic cough   Pulmonary embolus (HCC)   SIRS (systemic inflammatory response syndrome) (HCC)   Thrush   Goals of care, counseling/discussion  Acute on chronic hypoxic respiratory failure due to acute COVID illness/pneumonia/sepsis present on admission due to COVID pneumonia -Patient with recurrent admissions in the past due to viral respiratory illness, will admissions over last 43-month, currently still testing positive on each admission, and as of last admissions his SARS 2  IgG was negative. -Difficultly immunocompromised, acute COVID versus recurrent COVID infection. -Continue with IV steroids. -Continue with IV remdesivir. -Did receive high titer convalescent plasma x2. -Appreciated, they will check semiquantitative COVID antibody titer, flow they would consider repeat mAB -Incentive spirometry, flutter valve, will try to mobilize, he is on significant oxygen requirement, 4 L heated high flow nasal cannula and 15 NRB. -He is immunosuppressed, continue with Bactrim for PGP prophylaxis - anidulafungin since hx of c.glabrata  colonization with Fungitell assay -D/w staff , they will collect specimen for ur legionella, RVP and PJP stain    PE -Continue with Xarelto  Thrush -Prior sputum culture with Candida glabrata, he is currently on Andulifungin  RA -Has been on Rituxin and reports increased dose prior to development of COVID -He is on IV steroids  CAD -Continue ASA -Will need outpatient cardiology f/u unless acute issues arise while hospitalized  HTN -Continue Lopressor  HLD -Continue Zocor  DM -continue with ISS  Thrombocytopenia -likely  due to sepsis, monitor closely as he is on Xarelto  SpO2: 91 % O2 Flow Rate (L/min): 40 L/min FiO2 (%): 100 %  Recent Labs  Lab 26-Jun-2020 0310 Jun 26, 2020 0319 2020/06/26 0320 06/26/20 0545 06-26-20 1014 06/26/2020 1326 06/13/20 0118  WBC  --   --  9.6  --   --   --  7.3  PLT  --   --  PLATELET CLUMPS NOTED ON SMEAR, UNABLE TO ESTIMATE  --   --   --  74*  CRP  --   --   --   --  6.1*  --  8.6*  DDIMER  --   --   --   --  0.78*  --  0.58*  PROCALCITON  --   --   --   --  <0.10  --   --   AST  --   --  53*  --   --   --  32  ALT  --   --  61*  --   --   --  40  ALKPHOS  --   --  99  --   --   --  75  BILITOT  --   --  0.3  --   --   --  0.5  ALBUMIN  --   --  2.2*  --   --   --  2.1*  INR  --   --  1.7*  --   --   --   --   LATICACIDVEN  --  4.5*  --  2.8* 2.7* 3.2* 3.0*  SARSCOV2NAA POSITIVE*  --   --   --   --   --   --     FiO2 (%):  [100 %] 100 %  ABG     Component Value Date/Time   PHART 7.480 (H) 06/13/2020 0605   PCO2ART 29.2 (L) 06/13/2020 0605   PO2ART 58.8 (L) 06/13/2020 0605   HCO3 21.1 06/13/2020 0605   TCO2 21 (L) 06/29/2020 0643   ACIDBASEDEF 1.6 06/13/2020 0605   O2SAT 87.2 06/13/2020 0605           Condition - Extremely Guarded  Family Communication  : Daughter at bedside  Code Status : DNR  Consults  : PCCM, ID  Procedures  : 2 units of high titer convalescent plasma   Disposition Plan  :     Status is: Inpatient  Remains inpatient appropriate because:IV treatments appropriate due to intensity of illness or inability to take PO   Dispo:  Patient From: Home  Planned Disposition: Home  Medically stable for discharge: No        DVT Prophylaxis  :  xarelto  Lab Results  Component Value Date   PLT 74 (L) 06/13/2020    Diet :  Diet Order            Diet Carb Modified Fluid consistency: Thin; Room service appropriate? Yes  Diet effective now                  Inpatient Medications  Scheduled Meds: . vitamin C  500 mg Oral Daily  . Chlorhexidine Gluconate Cloth  6 each Topical Daily  . dexamethasone (DECADRON) injection  6 mg Intravenous Q6H  . docusate sodium  100 mg Oral BID  . feeding supplement  237 mL Oral TID BM  . insulin glargine  15 Units Subcutaneous QHS  . metoprolol tartrate  50 mg Oral BID  . mometasone-formoterol  2 puff Inhalation BID  . multivitamin with minerals  1 tablet Oral Daily  . pantoprazole  40 mg Oral Daily  . Rivaroxaban  15 mg Oral BID WC  . [START ON 06/16/2020] rivaroxaban  20 mg Oral Q supper  . simvastatin  20 mg Oral Daily  . sodium chloride flush  3 mL Intravenous Q12H  . sodium chloride flush  3 mL Intravenous Q12H  . sulfamethoxazole-trimethoprim  1 tablet Oral Once per day on Mon Wed Fri  . tamsulosin  0.4 mg Oral Daily  . umeclidinium bromide  1 puff Inhalation Daily  . zinc sulfate  220 mg Oral Daily   Continuous Infusions: . sodium chloride    . fluconazole (DIFLUCAN) IV 200 mg (06/13/20 1141)  . [START ON 06/14/2020] remdesivir 100 mg in NS 100 mL     PRN Meds:.sodium chloride, acetaminophen, albuterol, bisacodyl, chlorpheniramine-HYDROcodone, guaiFENesin-dextromethorphan, ipratropium-albuterol, melatonin, menthol-cetylpyridinium, ondansetron **OR** ondansetron (ZOFRAN) IV, oxyCODONE, polyethylene glycol, sodium chloride flush, sodium phosphate  Antibiotics  :    Anti-infectives (From admission, onward)  Start     Dose/Rate Route Frequency Ordered Stop   06/14/20 1000  remdesivir 100 mg in sodium chloride 0.9 % 100 mL IVPB       "Followed by" Linked Group Details   100 mg 200 mL/hr over 30 Minutes Intravenous Daily 06/13/20 1058 2020-07-15 0959   06/13/20 1145  remdesivir 200 mg in sodium chloride 0.9% 250 mL IVPB       "Followed by" Linked Group Details   200 mg 580 mL/hr over 30 Minutes Intravenous Once 06/13/20 1058 06/13/20 1315   06/13/20 0900  sulfamethoxazole-trimethoprim (BACTRIM DS) 800-160 MG per tablet 1 tablet        1 tablet Oral Once per day on Mon Wed Fri 06/08/2020 0946     06/10/2020 1045  fluconazole (DIFLUCAN) IVPB 200 mg        200 mg 100 mL/hr over 60 Minutes Intravenous Every 24 hours 06/27/2020 0946           Phillips Climes M.D on 06/13/2020 at 3:16 PM  To page go to [email or web address]  [facility] -  Office  [phone]      Objective:   Vitals:   06/13/20 0900 06/13/20 1200 06/13/20 1400 06/13/20 1505  BP: (!) 84/64  109/78 109/78  Pulse: 99 (!) 110  (!) 114  Resp: 20 20  (!) 26  Temp: 99.9 F (37.7 C) 99.2 F (37.3 C) 98.6 F (37 C)   TempSrc: Axillary Axillary Axillary   SpO2: 97% 98%  91%    Wt Readings from Last 3 Encounters:  05/25/20 88 kg  05/09/20 88 kg  05/05/20 88.5 kg     Intake/Output Summary (Last 24 hours) at 06/13/2020 1516 Last data filed at 06/13/2020 0430 Gross per 24 hour  Intake 514 ml  Output 375 ml  Net 139 ml     Physical Exam  Awake Alert, No new F.N deficits, Normal affect, extremely frail, deconditioned, tachypneic.  Symmetrical Chest wall movement, Good air movement bilaterally, significant for bilateral rales and rhonchi bilaterally, with some use of accessory muscles RRR,No Gallops,Rubs or new Murmurs, No Parasternal Heave +ve B.Sounds, Abd Soft, No tenderness, No organomegaly appriciated, No rebound - guarding or rigidity. No Cyanosis, Clubbing or edema, No new Rash or bruise      Data Review:     CBC Recent Labs  Lab 07/01/2020 0320 06/25/2020 0643 06/13/20 0118  WBC 9.6  --  7.3  HGB 14.9 11.9* 12.7*  HCT 47.3 35.0* 39.8  PLT PLATELET CLUMPS NOTED ON SMEAR, UNABLE TO ESTIMATE  --  74*  MCV 91.1  --  89.6  MCH 28.7  --  28.6  MCHC 31.5  --  31.9  RDW 20.1*  --  19.9*  LYMPHSABS 0.3*  --  0.1*  MONOABS 0.2  --  0.1  EOSABS 0.0  --  0.0  BASOSABS 0.0  --  0.0    Recent Labs  Lab 06/25/2020 0319 07/05/2020 0320 06/17/2020 0545 06/13/2020 0643 06/09/2020 1014 06/27/2020 1326 06/13/20 0118  NA  --  132*  --  132*  --   --  129*  K  --  4.6  --  4.3  --   --  3.9  CL  --  101  --   --   --   --  97*  CO2  --  23  --   --   --   --  25  GLUCOSE  --  233*  --   --   --   --  210*  BUN  --  32*  --   --   --   --  29*  CREATININE  --  0.98  --   --   --   --  0.92  CALCIUM  --  9.6  --   --   --   --  9.1  AST  --  53*  --   --   --   --  32  ALT  --  61*  --   --   --   --  40  ALKPHOS  --  99  --   --   --   --  75  BILITOT  --  0.3  --   --   --   --  0.5  ALBUMIN  --  2.2*  --   --   --   --  2.1*  MG  --   --   --   --   --   --  1.8  CRP  --   --   --   --  6.1*  --  8.6*  DDIMER  --   --   --   --  0.78*  --  0.58*  PROCALCITON  --   --   --   --  <0.10  --   --   LATICACIDVEN 4.5*  --  2.8*  --  2.7* 3.2* 3.0*  INR  --  1.7*  --   --   --   --   --     ------------------------------------------------------------------------------------------------------------------ No results for input(s): CHOL, HDL, LDLCALC, TRIG, CHOLHDL, LDLDIRECT in the last 72 hours.  Lab Results  Component Value Date   HGBA1C 6.3 (H) 05/12/2020   ------------------------------------------------------------------------------------------------------------------ No results for input(s): TSH, T4TOTAL, T3FREE, THYROIDAB in the last 72 hours.  Invalid input(s): FREET3  Cardiac Enzymes No results for input(s): CKMB, TROPONINI, MYOGLOBIN in the last 168 hours.  Invalid input(s):  CK ------------------------------------------------------------------------------------------------------------------    Component Value Date/Time   BNP 294.8 (H) 06/01/2020 0250    Micro Results Recent Results (from the past 240 hour(s))  Resp Panel by RT-PCR (Flu A&B, Covid) Nasopharyngeal Swab     Status: Abnormal   Collection Time: 06/23/2020  3:10 AM   Specimen: Nasopharyngeal Swab; Nasopharyngeal(NP) swabs in vial transport medium  Result Value Ref Range Status   SARS Coronavirus 2 by RT PCR POSITIVE (A) NEGATIVE Final    Comment: RESULT CALLED TO, READ BACK BY AND VERIFIED WITH: RN J VASQUEZ AT [id number] 06/05/2020 BY L BENFIELD (NOTE) SARS-CoV-2 target nucleic acids are DETECTED.  The SARS-CoV-2 RNA is generally detectable in upper respiratory specimens during the acute phase of infection. Positive results are indicative of the presence of the identified virus, but do not rule out bacterial infection or co-infection with other pathogens not detected by the test. Clinical correlation with patient history and other diagnostic information is necessary to determine patient infection status. The expected result is Negative.  Fact Sheet for Patients: EntrepreneurPulse.com.au  Fact Sheet for Healthcare Providers: IncredibleEmployment.be  This test is not yet approved or cleared by the [place] FDA and  has been authorized for detection and/or diagnosis of SARS-CoV-2 by FDA under an Emergency Use Authorization (EUA).  This EUA will remain in effect (meaning this t est can be used) for the duration of  the COVID-19 declaration under Section 564(b)(1) of the Act, 21 U.S.C. section 360bbb-3(b)(1), unless the authorization is terminated or revoked sooner.  Influenza A by PCR NEGATIVE NEGATIVE Final   Influenza B by PCR NEGATIVE NEGATIVE Final    Comment: (NOTE) The Xpert Xpress SARS-CoV-2/FLU/RSV plus assay is intended as an aid in the  diagnosis of influenza from Nasopharyngeal swab specimens and should not be used as a sole basis for treatment. Nasal washings and aspirates are unacceptable for Xpert Xpress SARS-CoV-2/FLU/RSV testing.  Fact Sheet for Patients: EntrepreneurPulse.com.au  Fact Sheet for Healthcare Providers: IncredibleEmployment.be  This test is not yet approved or cleared by the [place] FDA and has been authorized for detection and/or diagnosis of SARS-CoV-2 by FDA under an Emergency Use Authorization (EUA). This EUA will remain in effect (meaning this test can be used) for the duration of the COVID-19 declaration under Section 564(b)(1) of the Act, 21 U.S.C. section 360bbb-3(b)(1), unless the authorization is terminated or revoked.  Performed at [facility], [place] [address], [place], [place] [address]   Urine culture     Status: Abnormal (Preliminary result)   Collection Time: 07/02/2020  3:37 AM   Specimen: Urine, Clean Catch  Result Value Ref Range Status   Specimen Description URINE, CLEAN CATCH  Final   Special Requests NONE  Final   Culture (A)  Final    80,000 COLONIES/mL STAPHYLOCOCCUS EPIDERMIDIS SUSCEPTIBILITIES TO FOLLOW CULTURE REINCUBATED FOR BETTER GROWTH Performed at [facility], [place] [address] [place], [place] [address]    Report Status PENDING  Incomplete  Blood culture (routine single)     Status: None (Preliminary result)   Collection Time: 06/19/2020  3:40 AM   Specimen: BLOOD RIGHT ARM  Result Value Ref Range Status   Specimen Description BLOOD RIGHT ARM  Final   Special Requests   Final    BOTTLES DRAWN AEROBIC AND ANAEROBIC Blood Culture results may not be optimal due to an inadequate volume of blood received in culture bottles   Culture   Final    NO GROWTH 1 DAY Performed at [facility], [place] [address] [place],  [address]    Report Status PENDING  Incomplete    Radiology Reports CT ANGIO CHEST  PE W OR WO CONTRAST  Result Date: 06/16/2020 CLINICAL DATA:  I more foci of EXAM: CT ANGIOGRAPHY CHEST WITH CONTRAST TECHNIQUE: Multidetector CT imaging of the chest was performed using the standard protocol during bolus administration of intravenous contrast. Multiplanar CT image reconstructions and MIPs were obtained to evaluate the vascular anatomy. CONTRAST:  57mL OMNIPAQUE IOHEXOL 350 MG/ML SOLN COMPARISON:  CT angiography 05/14/2020, CT angiography 03/26/2020, CT angiography 11/27/2010 FINDINGS: Cardiovascular: Satisfactory opacification of the pulmonary arteries to the segmental level. Filling defect in the right lower lobe segmental pulmonary artery (5:52). No central pulmonary embolus. No definite left pulmonary embolus. Left ventricular hypertrophy. No pericardial effusion. Four-vessel coronary artery calcification. Mediastinum/Nodes: No enlarged mediastinal, hilar, or axillary lymph nodes. Thyroid gland, trachea, and esophagus demonstrate no significant findings. Lungs/Pleura: Redemonstration of cystic changes that are more apparent within the upper lobes likely related to emphysematous changes. Interlobular septal wall thickening. Redemonstration of peribronchovascular ground-glass airspace opacities and consolidation with associated bronchiectasis. No pleural effusion. No pneumothorax. Upper Abdomen: No acute abnormality. Musculoskeletal: No abdominal wall hernia or abnormality. No suspicious lytic or blastic osseous lesions. No acute displaced fracture. Multilevel degenerative changes of the spine. Review of the MIP images confirms the above findings. IMPRESSION: 1. Residual segmental right lower lobe pulmonary embolus with interval resolution of the central pulmonary embolus component. No associated heart strain. No definite  pulmonary infarction. 2. Diffuse pulmonary findings similar to at least as far back as 03/16/2020. Findings could be infectious/inflammatory. Underlying malignancy not excluded.  3. Left ventricular hypertrophy. 4. Aortic Atherosclerosis (ICD10-I70.0) and Emphysema (ICD10-J43.9). 5. Four-vessel coronary artery calcifications. These results were called by telephone at the time of interpretation on 06/24/2020 at 5:36 am to provider [facility] , who verbally acknowledged these results. Electronically Signed   By: Iven Finn M.D.   On: 06/23/2020 05:41   CT Angio Chest PE W and/or Wo Contrast  Result Date: 05/25/2020 CLINICAL DATA:  75 year old male with shortness of breath and chest pain. Concern for pulmonary embolism. EXAM: CT ANGIOGRAPHY CHEST WITH CONTRAST TECHNIQUE: Multidetector CT imaging of the chest was performed using the standard protocol during bolus administration of intravenous contrast. Multiplanar CT image reconstructions and MIPs were obtained to evaluate the vascular anatomy. CONTRAST:  48mL OMNIPAQUE IOHEXOL 350 MG/ML SOLN COMPARISON:  Chest CT dated 05/14/2020. FINDINGS: Cardiovascular: Mild cardiomegaly. No pericardial effusion. There is coronary vascular calcification. Mild atherosclerotic calcification of the thoracic aorta. Linear nonocclusive thrombus noted extending from the central right pulmonary artery into the lobar and segmental branches of the right lower lobe and right upper lobe. This is new since the prior CT. No CT evidence of right heart straining. Mediastinum/Nodes: Top-normal right hilar lymph nodes. The esophagus and the thyroid gland are grossly unremarkable. No mediastinal fluid collection. Lungs/Pleura: Bilateral patchy and streaky densities similar to prior CT. Calcified pleural plaque noted in the left upper lobe anteriorly. No pleural effusion pneumothorax. The central airways are patent. Upper Abdomen: No acute abnormality. Musculoskeletal: No chest wall abnormality. No acute or significant osseous findings. Review of the MIP images confirms the above findings. IMPRESSION: 1. Pulmonary artery embolus extending from the central right  pulmonary artery into the lobar and segmental branches of the right lower lobe and right upper lobe, new since the prior CT. No CT evidence of right heart straining. 2. Bilateral patchy and streaky densities similar to prior CT. 3. Aortic Atherosclerosis (ICD10-I70.0). These results were called by telephone at the time of interpretation on 05/25/2020 at 6:39 pm to provider DAVID YAO , who verbally acknowledged these results. Electronically Signed   By: Anner Crete M.D.   On: 05/25/2020 18:43   MR BRAIN W WO CONTRAST  Result Date: 05/16/2020 CLINICAL DATA:  Delirium; fever with unknown origin, altered mental status, evaluate for encephalitis. EXAM: MRI HEAD WITHOUT AND WITH CONTRAST TECHNIQUE: Multiplanar, multiecho pulse sequences of the brain and surrounding structures were obtained without and with intravenous contrast. CONTRAST:  30mL GADAVIST GADOBUTROL 1 MMOL/ML IV SOLN COMPARISON:  Prior head CT examinations 04/02/2020 and earlier. Brain MRI 12/13/2017. FINDINGS: Brain: Mild cerebral and cerebellar atrophy. AP punctate focus of restricted diffusion is questioned within the right parietal lobe (versus artifact) (series 3, image 33). Additionally, there is apparent subtle restricted diffusion along the midline callosal splenium, slightly eccentric to the left. A few small foci of T2/FLAIR hyperintensity within the bilateral cerebral white matter and right thalamus are nonspecific, but likely reflect minimal chronic small vessel ischemic disease. Redemonstrated left retrocerebellar arachnoid cyst. There is no acute infarct. No evidence of intracranial mass. No chronic intracranial blood products. No extra-axial fluid collection. No midline shift. No abnormal intracranial enhancement. Vascular: Expected proximal arterial flow voids. Skull and upper cervical spine: No focal marrow lesion. Incompletely assessed cervical spondylosis. Suspected C4-C5 vertebral body ankylosis. Sinuses/Orbits: Visualized orbits  show no acute finding. Bilateral lens replacements. Mild paranasal sinus mucosal thickening,  most notably within the bilateral ethmoid air cells. Small left maxillary sinus mucous retention cyst. Other: Right mastoid effusion. IMPRESSION: Punctate acute infarct versus artifact within the right parietal lobe. Additionally, there is apparent subtle restricted diffusion within the midline callosal splenium, slightly eccentric to the left. This may reflect a small focus of acute ischemia or artifact. Alternatively, this may reflect a cytotoxic lesion of the corpus callosum (which has a broad range of potential etiologies and clinical associations), although the appearance would be atypical for this entity. Background mild parenchymal atrophy and chronic small vessel ischemic disease. Mild paranasal sinus disease as described. Right mastoid effusion. Electronically Signed   By: Kellie Simmering DO   On: 05/16/2020 18:51   MR CERVICAL SPINE W WO CONTRAST  Result Date: 05/17/2020 CLINICAL DATA:  Initial screening for possible epidural abscess. EXAM: MRI CERVICAL SPINE WITHOUT AND WITH CONTRAST TECHNIQUE: Multiplanar and multiecho pulse sequences of the cervical spine, to include the craniocervical junction and cervicothoracic junction, were obtained without and with intravenous contrast. CONTRAST:  59mL GADAVIST GADOBUTROL 1 MMOL/ML IV SOLN COMPARISON:  Prior MRI from 03/24/2010. FINDINGS: Alignment: Reversal of the normal cervical lordosis with apex at C3-4. Trace anterolisthesis of C3 on C4 and C7 on T1, chronic and facet mediated. Vertebrae: Vertebral body height maintained without acute or chronic fracture. C4 and C5 vertebral bodies are largely ankylosed, likely degenerative. Bone marrow signal intensity within normal limits. No discrete or worrisome osseous lesions. No abnormal marrow edema or enhancement. No findings to suggest osteomyelitis discitis or septic arthritis. Cord: Normal signal and morphology. No  epidural abscess or other collection. Posterior Fossa, vertebral arteries, paraspinal tissues: Probable benign retro cerebellar arachnoid cyst noted. Visualized brain and posterior fossa otherwise unremarkable. Craniocervical junction within normal limits. Paraspinous and prevertebral soft tissues normal. Normal flow voids seen within the vertebral arteries bilaterally. Disc levels: C2-C3: Negative interspace. Right worse than left facet hypertrophy. No spinal stenosis. Mild right C3 foraminal narrowing. Left neural foramina remains patent. C3-C4: Broad-based posterior disc osteophyte flattens and effaces the ventral thecal sac. Mild flattening of the ventral cord without cord signal changes. Mild spinal stenosis. Superimposed bilateral facet hypertrophy with left greater than right uncovertebral spurring. Severe left with moderate right C4 foraminal stenosis. C4-C5: C4 and C5 vertebral bodies are largely ankylosed. Broad posterior endplate osseous ridging flattens and effaces the ventral thecal sac. Mild spinal stenosis without cord impingement. Moderate right worse than left C5 foraminal stenosis. C5-C6: Degenerative intervertebral disc space narrowing with diffuse disc osteophyte complex. Mild flattening of the ventral thecal sac without significant spinal stenosis. Moderate left worse than right C6 foraminal narrowing. C6-C7: Degenerative intervertebral disc space narrowing with diffuse disc osteophyte complex. No significant spinal stenosis. Severe left with moderate right C7 foraminal stenosis. C7-T1: Trace anterolisthesis. No significant disc bulge. Moderate bilateral facet and ligament flavum hypertrophy. No significant spinal stenosis. Foramina remain patent. IMPRESSION: 1. No evidence for osteomyelitis discitis or septic arthritis within the cervical spine. No epidural abscess or other infection. 2. Multilevel cervical spondylosis with resultant mild spinal stenosis at C3-4 and C4-5. 3. Multifactorial  degenerative changes with resultant multilevel foraminal narrowing as above. Notable findings include severe left with moderate right C4 foraminal stenosis, moderate bilateral C5 and C6 foraminal narrowing, with severe left and moderate right C7 foraminal stenosis. Electronically Signed   By: Jeannine Boga M.D.   On: 05/17/2020 21:40   MR THORACIC SPINE W WO CONTRAST  Result Date: 05/17/2020 CLINICAL DATA:  Initial screening for possible infection,  epidural abscess. EXAM: MRI THORACIC WITHOUT AND WITH CONTRAST TECHNIQUE: Multiplanar and multiecho pulse sequences of the thoracic spine were obtained without and with intravenous contrast. CONTRAST:  72mL GADAVIST GADOBUTROL 1 MMOL/ML IV SOLN COMPARISON:  None available. FINDINGS: Alignment: Straightening of the normal thoracic kyphosis. No listhesis. Vertebrae: Vertebral body height maintained without acute or chronic fracture. Bone marrow signal intensity within normal limits. Few scattered benign hemangiomata noted. No worrisome osseous lesions. No abnormal marrow edema or enhancement. No findings to suggest osteomyelitis discitis or septic arthritis. Cord: Normal signal and morphology. No epidural abscess or other collection. No abnormal enhancement. Paraspinal and other soft tissues: Paraspinous soft tissues demonstrate no acute finding. Small layering bilateral pleural effusions noted, left slightly larger than right. T2 hyperintense benign appearing cyst partially visualized extending from the right kidney. Visualized visceral structures otherwise grossly unremarkable. Disc levels: T1-2:  Unremarkable. T2-3: Tiny right paracentral disc extrusion with superior migration. Mild right-sided facet hypertrophy. No spinal stenosis. Foramina remain patent. T3-4: Minimal disc bulge. No spinal stenosis. Foramina remain patent. T4-5: Normal interspace. Mild right-sided facet hypertrophy. No stenosis. T5-6:  Normal interspace.  Mild facet hypertrophy.  No stenosis.  T6-7:  Unremarkable. T7-8: Mild degenerative intervertebral disc space narrowing with disc bulge. No spinal stenosis. Foramina remain patent. T8-9: Mild disc bulge. No spinal stenosis. Foramina remain patent. T9-10: Mild disc bulge with posterior element hypertrophy. No significant spinal stenosis. Mild bilateral foraminal narrowing. T10-11: Diffuse disc bulge, asymmetric to the right. Moderate facet and ligament flavum hypertrophy. Resultant mild to moderate spinal stenosis with mild cord flattening. No cord signal changes. Moderate bilateral foraminal narrowing. T11-12: Negative interspace. Bilateral facet hypertrophy. No significant spinal stenosis. Mild bilateral foraminal narrowing. T12-L1: Disc desiccation with small endplate Schmorl's node deformities. No stenosis. IMPRESSION: 1. No MRI evidence for osteomyelitis discitis or septic arthritis within the thoracic spine. No epidural abscess or other infection. 2. Disc bulging with facet hypertrophy at T10-11 with resultant mild to moderate spinal stenosis and moderate bilateral foraminal narrowing. 3. Additional mild spondylosis elsewhere within the thoracic spine without significant stenosis. 4. Small layering bilateral pleural effusions, left slightly larger than right. Electronically Signed   By: Jeannine Boga M.D.   On: 05/17/2020 21:52   CT ABDOMEN PELVIS W CONTRAST  Result Date: 05/15/2020 CLINICAL DATA:  Sepsis.  Fever of unknown origin. EXAM: CT ABDOMEN AND PELVIS WITH CONTRAST TECHNIQUE: Multidetector CT imaging of the abdomen and pelvis was performed using the standard protocol following bolus administration of intravenous contrast. CONTRAST:  129mL OMNIPAQUE IOHEXOL 300 MG/ML  SOLN COMPARISON:  04/02/2020.  Chest CTA obtained earlier today. FINDINGS: Lower chest: Progressive pulmonary embolism on the right, with emboli currently demonstrated in the distal right main pulmonary artery and significantly more clot in the intersegmental and  proximal right lower lobe pulmonary arteries. Bilateral lung densities and atelectasis with improvement since this morning. Hepatobiliary: Diffuse low density of the liver relative to the spleen. Normal appearing gallbladder. Pancreas: Unremarkable. No pancreatic ductal dilatation or surrounding inflammatory changes. Spleen: Normal in size without focal abnormality. Adrenals/Urinary Tract: Normal appearing adrenal glands. Bilateral renal cysts. Multiple small bilateral renal calculi. No bladder or ureteral calculi are seen. Excreted contrast in the urinary bladder from the CTA earlier today. Stomach/Bowel: Multiple colonic diverticula without evidence of diverticulitis. Unremarkable stomach, small bowel and appendix. Vascular/Lymphatic: Atheromatous arterial calcifications without aneurysm. No enlarged lymph nodes. Two renal arteries on the left. Reproductive: Mildly enlarged prostate gland. Other: Small bilateral inguinal hernias containing fat and very small umbilical  artery containing fat. Musculoskeletal: Stable lumbar and lower thoracic spine degenerative changes and fusion of the L1 and L2 vertebral bodies. IMPRESSION: 1. Progressive pulmonary embolism on the right, with emboli currently demonstrated in the distal right main pulmonary artery and significantly more clot in the intersegmental and proximal right lower lobe pulmonary arteries. These results will be called to the ordering clinician or representative by the Radiologist Assistant, and communication documented in the PACS or [employer]. 2. Lung densities and atelectasis with improvement since this morning. 3. Diffuse hepatic steatosis. 4. Multiple small, nonobstructing bilateral renal calculi. 5. Colonic diverticulosis. Electronically Signed   By: Claudie Revering M.D.   On: 05/15/2020 22:17   DG CHEST PORT 1 VIEW  Result Date: 06/13/2020 CLINICAL DATA:  Pneumonia due to COVID-19 virus.  Fever. EXAM: PORTABLE CHEST 1 VIEW COMPARISON:  1 day prior  FINDINGS: Midline trachea. Normal heart size for level of inspiration. No pleural effusion or pneumothorax. Worsened, right greater than left interstitial opacities. Relatively diffuse, with more normal aerated left upper lung. IMPRESSION: Worsened COVID-19 pneumonia. Electronically Signed   By: Abigail Miyamoto M.D.   On: 06/13/2020 08:17   DG Chest Port 1 View  Result Date: 07/05/2020 CLINICAL DATA:  Sepsis EXAM: PORTABLE CHEST 1 VIEW COMPARISON:  05/30/2020 FINDINGS: Bilateral interstitial opacities, unchanged. Normal cardiomediastinal contours. No pneumothorax or sizable pleural effusion. IMPRESSION: Unchanged bilateral interstitial opacities. Electronically Signed   By: Ulyses Jarred M.D.   On: 06/26/2020 03:48   DG Chest Port 1 View  Result Date: 05/30/2020 CLINICAL DATA:  Difficulty breathing, low O2 sats, history of COVID infection. EXAM: PORTABLE CHEST 1 VIEW COMPARISON:  05/29/2020 and CT chest 05/25/2020. FINDINGS: Heart size stable. Patchy mixed interstitial and airspace opacification bilaterally, increased from 05/29/2020. No pleural fluid. IMPRESSION: Worsening COVID-19 pneumonia. Electronically Signed   By: Lorin Picket M.D.   On: 05/30/2020 09:11   DG Chest Port 1 View  Result Date: 05/29/2020 CLINICAL DATA:  Shortness of breath.  Covid-19 viral infection. EXAM: PORTABLE CHEST 1 VIEW COMPARISON:  05/28/2020 FINDINGS: Heart size remains within normal limits. Diffuse interstitial infiltrates are again seen with mild patchy airspace opacities in the left lung base. These show no significant change since previous study. No evidence of pleural effusion. IMPRESSION: No significant change in diffuse interstitial infiltrates with mild patchy airspace disease in left lower lung. Electronically Signed   By: Marlaine Hind M.D.   On: 05/29/2020 08:36   DG Chest Port 1 View  Result Date: 05/28/2020 CLINICAL DATA:  Shortness of breath EXAM: PORTABLE CHEST 1 VIEW COMPARISON:  Three days ago FINDINGS:  Patchy bilateral pulmonary infiltrate without convincing progression. Lung volumes are low. No visible effusion or air leak. Normal heart size and stable mediastinal contours. IMPRESSION: Similar degree of bilateral pulmonary infiltrates. No new abnormality. Electronically Signed   By: Monte Fantasia M.D.   On: 05/28/2020 07:38   DG Chest Port 1 View  Result Date: 05/25/2020 CLINICAL DATA:  75 year old male with concern for sepsis. EXAM: PORTABLE CHEST 1 VIEW COMPARISON:  Chest CT dated 05/14/2020. FINDINGS: Diffuse bilateral streaky interstitial densities worsened since 05/09/2020 but relatively similar to 05/10/2020 concerning for residual infiltrate, likely on the background of chronic changes. Clinical correlation and follow-up recommended. No lobar consolidation, pleural effusion, pneumothorax. The cardiac silhouette is within limits. No acute osseous pathology. IMPRESSION: Persistent bilateral infiltrates. Electronically Signed   By: Anner Crete M.D.   On: 05/25/2020 16:24   ECHOCARDIOGRAM COMPLETE  Result Date: 05/26/2020  ECHOCARDIOGRAM REPORT   Patient Name:   Tyrone Roman Date of Exam: 05/26/2020 Medical Rec #:  [id number]      Height:       74.0 in Accession #:    [id number]     Weight:       194.0 lb Date of Birth:  1945-10-09      BSA:          2.146 m Patient Age:    55 years       BP:           129/92 mmHg Patient Gender: M              HR:           84 bpm. Exam Location:  Inpatient Procedure: 2D Echo, Cardiac Doppler, Color Doppler and Intracardiac            Opacification Agent Indications:    Pulmonary Embolus I26.09  History:        Patient has prior history of Echocardiogram examinations, most                 recent 04/03/2020. CAD and Previous Myocardial Infarction, COPD,                 Signs/Symptoms:Shortness of Breath; Risk Factors:Hypertension                 and Dyslipidemia. COVID 19. Sepsis, due to unspecified organism,                 unspecified whether acute organ  dysfunction present                 Multiple subsegmental pulmonary emboli without acute cor                 pulmonale.  Sonographer:    Darlina Sicilian RDCS Referring Phys: [id number] [place]  1. Left ventricular ejection fraction, by estimation, is 50 to 55%. The left ventricle has low normal function. The left ventricle has no regional wall motion abnormalities. There is severe asymmetric left ventricular hypertrophy of the basal and septal  segments. Left ventricular diastolic parameters are consistent with Grade II diastolic dysfunction (pseudonormalization). Elevated left ventricular end-diastolic pressure.  2. Right ventricular systolic function is normal. The right ventricular size is normal.  3. The mitral valve is abnormal. No evidence of mitral valve regurgitation. No evidence of mitral stenosis.  4. The aortic valve is tricuspid. There is mild calcification of the aortic valve. Aortic valve regurgitation is not visualized. Mild aortic valve sclerosis is present, with no evidence of aortic valve stenosis.  5. Aortic dilatation noted. There is mild dilatation of the aortic root, measuring 41 mm.  6. The inferior vena cava is normal in size with greater than 50% respiratory variability, suggesting right atrial pressure of 3 mmHg. FINDINGS  Left Ventricle: Left ventricular ejection fraction, by estimation, is 50 to 55%. The left ventricle has low normal function. The left ventricle has no regional wall motion abnormalities. Definity contrast agent was given IV to delineate the left ventricular endocardial borders. The left ventricular internal cavity size was normal in size. There is severe asymmetric left ventricular hypertrophy of the basal and septal segments. Left ventricular diastolic parameters are consistent with Grade II diastolic dysfunction (pseudonormalization). Elevated left ventricular end-diastolic pressure. Right Ventricle: The right ventricular size is normal. No increase in  right ventricular wall thickness. Right ventricular systolic function is normal. Left Atrium: Left atrial  size was normal in size. Right Atrium: Right atrial size was normal in size. Pericardium: There is no evidence of pericardial effusion. Mitral Valve: The mitral valve is abnormal. There is mild thickening of the mitral valve leaflet(s). There is mild calcification of the mitral valve leaflet(s). Mild mitral annular calcification. No evidence of mitral valve regurgitation. No evidence of mitral valve stenosis. Tricuspid Valve: The tricuspid valve is normal in structure. Tricuspid valve regurgitation is not demonstrated. No evidence of tricuspid stenosis. Aortic Valve: The aortic valve is tricuspid. There is mild calcification of the aortic valve. Aortic valve regurgitation is not visualized. Mild aortic valve sclerosis is present, with no evidence of aortic valve stenosis. Pulmonic Valve: The pulmonic valve was normal in structure. Pulmonic valve regurgitation is not visualized. No evidence of pulmonic stenosis. Aorta: The aortic root is normal in size and structure and aortic dilatation noted. There is mild dilatation of the aortic root, measuring 41 mm. Venous: The inferior vena cava is normal in size with greater than 50% respiratory variability, suggesting right atrial pressure of 3 mmHg. IAS/Shunts: No atrial level shunt detected by color flow Doppler.  LEFT VENTRICLE PLAX 2D LVIDd:         3.20 cm     Diastology LVIDs:         2.60 cm     LV e' medial:    3.15 cm/s LV PW:         1.40 cm     LV E/e' medial:  17.1 LV IVS:        2.00 cm     LV e' lateral:   3.37 cm/s LVOT diam:     2.00 cm     LV E/e' lateral: 16.0 LV SV:         41 LV SV Index:   19 LVOT Area:     3.14 cm  LV Volumes (MOD) LV vol d, MOD A2C: 91.2 ml LV vol d, MOD A4C: 78.4 ml LV vol s, MOD A2C: 43.8 ml LV vol s, MOD A4C: 36.8 ml LV SV MOD A2C:     47.4 ml LV SV MOD A4C:     78.4 ml LV SV MOD BP:      44.4 ml RIGHT VENTRICLE RV S prime:      8.16 cm/s LEFT ATRIUM             Index       RIGHT ATRIUM          Index LA diam:        3.00 cm 1.40 cm/m  RA Area:     6.38 cm LA Vol (A2C):   20.1 ml 9.37 ml/m  RA Volume:   7.69 ml  3.58 ml/m LA Vol (A4C):   21.3 ml 9.93 ml/m LA Biplane Vol: 21.6 ml 10.07 ml/m  AORTIC VALVE LVOT Vmax:   80.50 cm/s LVOT Vmean:  60.800 cm/s LVOT VTI:    0.130 m  AORTA Ao Root diam: 4.10 cm Ao Asc diam:  3.40 cm MITRAL VALVE MV Area (PHT): 3.01 cm    SHUNTS MV Decel Time: 252 msec    Systemic VTI:  0.13 m MV E velocity: 53.80 cm/s  Systemic Diam: 2.00 cm MV A velocity: 81.50 cm/s MV E/A ratio:  0.66 Jenkins Rouge MD Electronically signed by Jenkins Rouge MD Signature Date/Time: 05/26/2020/2:30:51 PM    Final    VAS [place] LOWER EXTREMITY VENOUS (DVT)  Result Date: 05/26/2020  Lower Venous DVT Study  Indications: Edema, and Swelling.  Comparison Study: 05/11/20 previous Performing Technologist: Abram Sander RVS  Examination Guidelines: A complete evaluation includes B-mode imaging, spectral Doppler, color Doppler, and power Doppler as needed of all accessible portions of each vessel. Bilateral testing is considered an integral part of a complete examination. Limited examinations for reoccurring indications may be performed as noted. The reflux portion of the exam is performed with the patient in reverse Trendelenburg.  +---------+---------------+---------+-----------+----------+--------------+ RIGHT    CompressibilityPhasicitySpontaneityPropertiesThrombus Aging +---------+---------------+---------+-----------+----------+--------------+ CFV      Full           Yes      Yes                                 +---------+---------------+---------+-----------+----------+--------------+ SFJ      Full                                                        +---------+---------------+---------+-----------+----------+--------------+ FV Prox  Full                                                         +---------+---------------+---------+-----------+----------+--------------+ FV Mid   Full                                                        +---------+---------------+---------+-----------+----------+--------------+ FV DistalFull                                                        +---------+---------------+---------+-----------+----------+--------------+ PFV      Full                                                        +---------+---------------+---------+-----------+----------+--------------+ POP      Full           Yes      Yes                                 +---------+---------------+---------+-----------+----------+--------------+ PTV      Full                                                        +---------+---------------+---------+-----------+----------+--------------+ PERO     Full                                                        +---------+---------------+---------+-----------+----------+--------------+   +---------+---------------+---------+-----------+----------+-----------------+  LEFT     CompressibilityPhasicitySpontaneityPropertiesThrombus Aging    +---------+---------------+---------+-----------+----------+-----------------+ CFV      Full           Yes      Yes                                    +---------+---------------+---------+-----------+----------+-----------------+ SFJ      Full                                                           +---------+---------------+---------+-----------+----------+-----------------+ FV Prox  Full                                                           +---------+---------------+---------+-----------+----------+-----------------+ FV Mid   Full                                                           +---------+---------------+---------+-----------+----------+-----------------+ FV DistalFull                                                            +---------+---------------+---------+-----------+----------+-----------------+ PFV      Full                                                           +---------+---------------+---------+-----------+----------+-----------------+ POP      None           No       No                   Age Indeterminate +---------+---------------+---------+-----------+----------+-----------------+ PTV      None                                         Age Indeterminate +---------+---------------+---------+-----------+----------+-----------------+ PERO     None                                         Age Indeterminate +---------+---------------+---------+-----------+----------+-----------------+     Summary: RIGHT: - There is no evidence of deep vein thrombosis in the lower extremity.  - No cystic structure found in the popliteal fossa.  LEFT: - Findings consistent with age indeterminate deep vein thrombosis involving the left popliteal vein, left posterior tibial veins, and left peroneal veins. - No cystic structure found in the popliteal fossa.  *  See table(s) above for measurements and observations. Electronically signed by Harold Barban MD on 05/26/2020 at 9:28:41 PM.    Final

## 2020-06-13 NOTE — Progress Notes (Signed)
[name]    Date of Admission:  06-29-20   Total days of antibiotics 2           ID: Tyrone Roman is a 75 y.o. male with  Hx of RA, hx of rituximab with prolonged acute covid illness with worsening respiratory distress. (tx with remdesivir and mAb on 4/21)  Principal Problem:   Acute respiratory failure with hypoxia (HCC) Active Problems:   Hyperlipidemia   COPD (chronic obstructive pulmonary disease) (HCC)   Essential hypertension   Post-COVID-19 syndrome manifesting as chronic cough   Pulmonary embolus (HCC)   SIRS (systemic inflammatory response syndrome) (HCC)   Thrush   Goals of care, counseling/discussion    Subjective: Last night received 2 unit of high titer CCP. Did have some worsening shortness of breath this morning- received dose of diuretics to see if improvement. He does report having fever overnight but now subsided. Feels only slightly improved  Medications:  . vitamin C  500 mg Oral Daily  . Chlorhexidine Gluconate Cloth  6 each Topical Daily  . dexamethasone (DECADRON) injection  6 mg Intravenous Q6H  . docusate sodium  100 mg Oral BID  . feeding supplement  237 mL Oral TID BM  . insulin glargine  15 Units Subcutaneous QHS  . metoprolol tartrate  50 mg Oral BID  . mometasone-formoterol  2 puff Inhalation BID  . multivitamin with minerals  1 tablet Oral Daily  . pantoprazole  40 mg Oral Daily  . Rivaroxaban  15 mg Oral BID WC  . [START ON 06/16/2020] rivaroxaban  20 mg Oral Q supper  . simvastatin  20 mg Oral Daily  . sodium chloride flush  3 mL Intravenous Q12H  . sodium chloride flush  3 mL Intravenous Q12H  . sulfamethoxazole-trimethoprim  1 tablet Oral Once per day on Mon Wed Fri  . tamsulosin  0.4 mg Oral Daily  . umeclidinium bromide  1 puff Inhalation Daily  . zinc sulfate  220 mg Oral Daily    Objective: Vital signs in last 24 hours: Temp:  [97.6 F (36.4 C)-101.7 F (38.7 C)] 99.2 F (37.3 C) (05/09  1200) Pulse Rate:  [83-119] 110 (05/09 1200) Resp:  [19-28] 20 (05/09 1200) BP: (80-135)/(60-100) 84/64 (05/09 0900) SpO2:  [87 %-98 %] 98 % (05/09 1200) FiO2 (%):  [100 %] 100 % (05/09 0857) Physical Exam  Constitutional: He is oriented to person, place, and time. He appears chronically ill and well-nourished. No distress.  HENT:  Mouth/Throat: Oropharynx is clear and moist. No oropharyngeal exudate.  Cardiovascular: Normal rate, regular rhythm and normal heart sounds. Exam reveals no gallop and no friction rub.  No murmur heard.  Pulmonary/Chest: Effort normal and breath sounds normal.no acc. Muscle use. Has bilateral diffuse crackles Abdominal: Soft. Bowel sounds are normal. He exhibits no distension. There is no tenderness.  Lymphadenopathy:  He has no cervical adenopathy.  Neurological: He is alert and oriented to person, place, and time.  Skin: Skin is warm and dry. No rash noted. No erythema.  Psychiatric: He has a normal mood and affect. His behavior is normal.     Lab Results Recent Labs    06/29/20 0320 29-Jun-2020 0643 06/13/20 0118  WBC 9.6  --  7.3  HGB 14.9 11.9* 12.7*  HCT 47.3 35.0* 39.8  NA 132* 132* 129*  K 4.6 4.3 3.9  CL 101  --  97*  CO2 23  --  25  BUN 32*  --  29*  CREATININE 0.98  --  0.92   Liver Panel Recent Labs    2020/06/25 0320 06/13/20 0118  PROT 5.4* 5.0*  ALBUMIN 2.2* 2.1*  AST 53* 32  ALT 61* 40  ALKPHOS 99 75  BILITOT 0.3 0.5   C-Reactive Protein Recent Labs    2020/06/25 1014 06/13/20 0118  CRP 6.1* 8.6*    Microbiology: reviewed Studies/Results: CT ANGIO CHEST PE W OR WO CONTRAST  Result Date: 2020-06-25 CLINICAL DATA:  I more foci of EXAM: CT ANGIOGRAPHY CHEST WITH CONTRAST TECHNIQUE: Multidetector CT imaging of the chest was performed using the standard protocol during bolus administration of intravenous contrast. Multiplanar CT image reconstructions and MIPs were obtained to evaluate the vascular anatomy. CONTRAST:  35mL  OMNIPAQUE IOHEXOL 350 MG/ML SOLN COMPARISON:  CT angiography 05/14/2020, CT angiography 03/26/2020, CT angiography 11/27/2010 FINDINGS: Cardiovascular: Satisfactory opacification of the pulmonary arteries to the segmental level. Filling defect in the right lower lobe segmental pulmonary artery (5:52). No central pulmonary embolus. No definite left pulmonary embolus. Left ventricular hypertrophy. No pericardial effusion. Four-vessel coronary artery calcification. Mediastinum/Nodes: No enlarged mediastinal, hilar, or axillary lymph nodes. Thyroid gland, trachea, and esophagus demonstrate no significant findings. Lungs/Pleura: Redemonstration of cystic changes that are more apparent within the upper lobes likely related to emphysematous changes. Interlobular septal wall thickening. Redemonstration of peribronchovascular ground-glass airspace opacities and consolidation with associated bronchiectasis. No pleural effusion. No pneumothorax. Upper Abdomen: No acute abnormality. Musculoskeletal: No abdominal wall hernia or abnormality. No suspicious lytic or blastic osseous lesions. No acute displaced fracture. Multilevel degenerative changes of the spine. Review of the MIP images confirms the above findings. IMPRESSION: 1. Residual segmental right lower lobe pulmonary embolus with interval resolution of the central pulmonary embolus component. No associated heart strain. No definite pulmonary infarction. 2. Diffuse pulmonary findings similar to at least as far back as 03/16/2020. Findings could be infectious/inflammatory. Underlying malignancy not excluded. 3. Left ventricular hypertrophy. 4. Aortic Atherosclerosis (ICD10-I70.0) and Emphysema (ICD10-J43.9). 5. Four-vessel coronary artery calcifications. These results were called by telephone at the time of interpretation on 06-25-20 at 5:36 am to provider [facility] , who verbally acknowledged these results. Electronically Signed   By: Iven Finn M.D.   On:  Jun 25, 2020 05:41   DG CHEST PORT 1 VIEW  Result Date: 06/13/2020 CLINICAL DATA:  Pneumonia due to COVID-19 virus.  Fever. EXAM: PORTABLE CHEST 1 VIEW COMPARISON:  1 day prior FINDINGS: Midline trachea. Normal heart size for level of inspiration. No pleural effusion or pneumothorax. Worsened, right greater than left interstitial opacities. Relatively diffuse, with more normal aerated left upper lung. IMPRESSION: Worsened COVID-19 pneumonia. Electronically Signed   By: Abigail Miyamoto M.D.   On: 06/13/2020 08:17   DG Chest Port 1 View  Result Date: Jun 25, 2020 CLINICAL DATA:  Sepsis EXAM: PORTABLE CHEST 1 VIEW COMPARISON:  05/30/2020 FINDINGS: Bilateral interstitial opacities, unchanged. Normal cardiomediastinal contours. No pneumothorax or sizable pleural effusion. IMPRESSION: Unchanged bilateral interstitial opacities. Electronically Signed   By: Ulyses Jarred M.D.   On: 06/25/20 03:48     Assessment/Plan: Acute covid illness = recommend repeat remdesivir course. Will check semi quant covid ab titer. If low, possibly consider repeat mAb. Review of literature for IVIG not convincing for consistent improvement/effectiveness.  Overall condition is guarded  -please collect specimen for ur legionella, RVP and PJP stain  - will repeat fungitell assay  - will change fluconazole to anidulafungin since hx of c.glabrata colonization  Immunosuppressed = continue on bactrim for pjp proph  [facility] for Infectious Diseases Cell: [phone] Pager: [phone]  06/13/2020, 1:41 PM

## 2020-06-13 NOTE — Progress Notes (Signed)
OT Cancellation Note  Patient Details Name: Tyrone Roman MRN: [id number] DOB: 1945-04-28   Cancelled Treatment:    Reason Eval/Treat Not Completed: Medical issues which prohibited therapy;Patient not medically ready Discussed case with RN, who requests we hold therapy for today as pt is still medically unstable. Will check back on next date of service.   Layla Maw 06/13/2020, 10:50 AM

## 2020-06-14 DIAGNOSIS — J9621 Acute and chronic respiratory failure with hypoxia: Secondary | ICD-10-CM | POA: Diagnosis not present

## 2020-06-14 DIAGNOSIS — I2692 Saddle embolus of pulmonary artery without acute cor pulmonale: Secondary | ICD-10-CM

## 2020-06-14 DIAGNOSIS — R6510 Systemic inflammatory response syndrome (SIRS) of non-infectious origin without acute organ dysfunction: Secondary | ICD-10-CM

## 2020-06-14 DIAGNOSIS — U071 COVID-19: Secondary | ICD-10-CM | POA: Diagnosis not present

## 2020-06-14 DIAGNOSIS — J449 Chronic obstructive pulmonary disease, unspecified: Secondary | ICD-10-CM | POA: Diagnosis not present

## 2020-06-14 DIAGNOSIS — J1282 Pneumonia due to coronavirus disease 2019: Secondary | ICD-10-CM | POA: Diagnosis not present

## 2020-06-14 DIAGNOSIS — J9601 Acute respiratory failure with hypoxia: Secondary | ICD-10-CM | POA: Diagnosis not present

## 2020-06-14 DIAGNOSIS — E785 Hyperlipidemia, unspecified: Secondary | ICD-10-CM | POA: Diagnosis not present

## 2020-06-14 LAB — COMPREHENSIVE METABOLIC PANEL
ALT: 33 U/L (ref 0–44)
AST: 36 U/L (ref 15–41)
Albumin: 1.8 g/dL — ABNORMAL LOW (ref 3.5–5.0)
Alkaline Phosphatase: 74 U/L (ref 38–126)
Anion gap: 13 (ref 5–15)
BUN: 37 mg/dL — ABNORMAL HIGH (ref 8–23)
CO2: 22 mmol/L (ref 22–32)
Calcium: 8.9 mg/dL (ref 8.9–10.3)
Chloride: 97 mmol/L — ABNORMAL LOW (ref 98–111)
Creatinine, Ser: 1.07 mg/dL (ref 0.61–1.24)
GFR, Estimated: >60 mL/min (ref >60)
Glucose, Bld: 310 mg/dL — ABNORMAL HIGH (ref 70–99)
Potassium: 2.8 mmol/L — ABNORMAL LOW (ref 3.5–5.1)
Sodium: 132 mmol/L — ABNORMAL LOW (ref 135–145)
Total Bilirubin: 0.8 mg/dL (ref 0.3–1.2)
Total Protein: 4.9 g/dL — ABNORMAL LOW (ref 6.5–8.1)

## 2020-06-14 LAB — CBC WITH DIFFERENTIAL/PLATELET
Abs Immature Granulocytes: 0.04 10*3/uL (ref 0.00–0.07)
Basophils Absolute: 0 10*3/uL (ref 0.0–0.1)
Basophils Relative: 0 %
Eosinophils Absolute: 0 10*3/uL (ref 0.0–0.5)
Eosinophils Relative: 0 %
HCT: 34.6 % — ABNORMAL LOW (ref 39.0–52.0)
Hemoglobin: 11.5 g/dL — ABNORMAL LOW (ref 13.0–17.0)
Immature Granulocytes: 1 %
Lymphocytes Relative: 2 %
Lymphs Abs: 0.1 10*3/uL — ABNORMAL LOW (ref 0.7–4.0)
MCH: 29 pg (ref 26.0–34.0)
MCHC: 33.2 g/dL (ref 30.0–36.0)
MCV: 87.4 fL (ref 80.0–100.0)
Monocytes Absolute: 0 10*3/uL — ABNORMAL LOW (ref 0.1–1.0)
Monocytes Relative: 1 %
Neutro Abs: 4.6 10*3/uL (ref 1.7–7.7)
Neutrophils Relative %: 96 %
Platelets: UNDETERMINED 10*3/uL (ref 150–400)
RBC: 3.96 MIL/uL — ABNORMAL LOW (ref 4.22–5.81)
RDW: 19 % — ABNORMAL HIGH (ref 11.5–15.5)
WBC: 4.8 10*3/uL (ref 4.0–10.5)
nRBC: 0 % (ref 0.0–0.2)

## 2020-06-14 LAB — PREPARE FRESH FROZEN PLASMA

## 2020-06-14 LAB — BPAM FFP
Blood Product Expiration Date: 202205092120
Blood Product Expiration Date: 202205092305
ISSUE DATE / TIME: 202205082303
ISSUE DATE / TIME: 202205090145
Unit Type and Rh: 600
Unit Type and Rh: 6200

## 2020-06-14 LAB — URINE CULTURE: Culture: 80000 — AB

## 2020-06-14 LAB — GLUCOSE, CAPILLARY
Glucose-Capillary: 134 mg/dL — ABNORMAL HIGH (ref 70–99)
Glucose-Capillary: 176 mg/dL — ABNORMAL HIGH (ref 70–99)
Glucose-Capillary: 213 mg/dL — ABNORMAL HIGH (ref 70–99)
Glucose-Capillary: 376 mg/dL — ABNORMAL HIGH (ref 70–99)
Glucose-Capillary: 388 mg/dL — ABNORMAL HIGH (ref 70–99)
Glucose-Capillary: 424 mg/dL — ABNORMAL HIGH (ref 70–99)
Glucose-Capillary: 470 mg/dL — ABNORMAL HIGH (ref 70–99)

## 2020-06-14 LAB — C-REACTIVE PROTEIN: CRP: 24.7 mg/dL — ABNORMAL HIGH (ref <1.0)

## 2020-06-14 LAB — MAGNESIUM: Magnesium: 1.8 mg/dL (ref 1.7–2.4)

## 2020-06-14 LAB — PHOSPHORUS: Phosphorus: 2.5 mg/dL (ref 2.5–4.6)

## 2020-06-14 LAB — PROCALCITONIN: Procalcitonin: 0.77 ng/mL

## 2020-06-14 LAB — LEGIONELLA PNEUMOPHILA SEROGP 1 UR AG: L. pneumophila Serogp 1 Ur Ag: NEGATIVE

## 2020-06-14 LAB — FERRITIN: Ferritin: 2194 ng/mL — ABNORMAL HIGH (ref 24–336)

## 2020-06-14 LAB — D-DIMER, QUANTITATIVE: D-Dimer, Quant: 1.48 ug/mL-FEU — ABNORMAL HIGH (ref 0.00–0.50)

## 2020-06-14 MED ORDER — ALBUTEROL SULFATE HFA 108 (90 BASE) MCG/ACT IN AERS
2.0000 | INHALATION_SPRAY | Freq: Once | RESPIRATORY_TRACT | Status: DC | PRN
  Filled 2020-06-14: qty 6.7

## 2020-06-14 MED ORDER — INSULIN ASPART 100 UNIT/ML IJ SOLN
0.0000 [IU] | INTRAMUSCULAR | Status: DC
  Administered 2020-06-14: 3 [IU] via SUBCUTANEOUS
  Administered 2020-06-14 (×2): 1 [IU] via SUBCUTANEOUS
  Administered 2020-06-14 (×2): 9 [IU] via SUBCUTANEOUS
  Administered 2020-06-15 (×2): 2 [IU] via SUBCUTANEOUS
  Administered 2020-06-15: 3 [IU] via SUBCUTANEOUS
  Administered 2020-06-15: 5 [IU] via SUBCUTANEOUS
  Administered 2020-06-15 – 2020-06-16 (×3): 9 [IU] via SUBCUTANEOUS
  Administered 2020-06-16: 3 [IU] via SUBCUTANEOUS

## 2020-06-14 MED ORDER — SODIUM CHLORIDE 0.9 % IV SOLN
200.0000 mg | Freq: Once | INTRAVENOUS | Status: AC
  Administered 2020-06-14: 200 mg via INTRAVENOUS
  Filled 2020-06-14: qty 200

## 2020-06-14 MED ORDER — METHYLPREDNISOLONE SODIUM SUCC 125 MG IJ SOLR
60.0000 mg | Freq: Three times a day (TID) | INTRAMUSCULAR | Status: DC
  Administered 2020-06-14 – 2020-06-16 (×5): 60 mg via INTRAVENOUS
  Filled 2020-06-14 (×5): qty 2

## 2020-06-14 MED ORDER — POTASSIUM CHLORIDE 10 MEQ/100ML IV SOLN
10.0000 meq | INTRAVENOUS | Status: AC
  Administered 2020-06-14 (×3): 10 meq via INTRAVENOUS
  Filled 2020-06-14 (×2): qty 100

## 2020-06-14 MED ORDER — SODIUM CHLORIDE 0.9 % IV SOLN
100.0000 mg | INTRAVENOUS | Status: DC
  Administered 2020-06-15 – 2020-06-16 (×2): 100 mg via INTRAVENOUS
  Filled 2020-06-14 (×2): qty 100

## 2020-06-14 MED ORDER — INSULIN ASPART 100 UNIT/ML IJ SOLN
5.0000 [IU] | Freq: Once | INTRAMUSCULAR | Status: AC
  Administered 2020-06-14: 5 [IU] via SUBCUTANEOUS

## 2020-06-14 MED ORDER — POTASSIUM CHLORIDE 10 MEQ/100ML IV SOLN
10.0000 meq | INTRAVENOUS | Status: DC
  Filled 2020-06-14: qty 100

## 2020-06-14 MED ORDER — DIPHENHYDRAMINE HCL 50 MG/ML IJ SOLN: 50.0000 mg | Freq: Once | INTRAMUSCULAR | Status: DC | PRN

## 2020-06-14 MED ORDER — FAMOTIDINE IN NACL 20-0.9 MG/50ML-% IV SOLN
20.0000 mg | Freq: Once | INTRAVENOUS | Status: DC | PRN
  Filled 2020-06-14: qty 50

## 2020-06-14 MED ORDER — POTASSIUM CHLORIDE CRYS ER 20 MEQ PO TBCR
40.0000 meq | EXTENDED_RELEASE_TABLET | Freq: Once | ORAL | Status: AC
  Administered 2020-06-14: 40 meq via ORAL
  Filled 2020-06-14: qty 2

## 2020-06-14 MED ORDER — METHYLPREDNISOLONE SODIUM SUCC 125 MG IJ SOLR: 125.0000 mg | Freq: Once | INTRAMUSCULAR | Status: DC | PRN

## 2020-06-14 MED ORDER — BEBTELOVIMAB 175 MG/2 ML IV (EUA)
175.0000 mg | Freq: Once | INTRAMUSCULAR | Status: AC
  Administered 2020-06-14: 175 mg via INTRAVENOUS
  Filled 2020-06-14: qty 2

## 2020-06-14 MED ORDER — SODIUM CHLORIDE 0.9 % IV SOLN: INTRAVENOUS | Status: DC | PRN

## 2020-06-14 MED ORDER — INSULIN ASPART 100 UNIT/ML IJ SOLN: 0.0000 [IU] | INTRAMUSCULAR | Status: DC

## 2020-06-14 MED ORDER — EPINEPHRINE 0.3 MG/0.3ML IJ SOAJ
0.3000 mg | Freq: Once | INTRAMUSCULAR | Status: DC | PRN
  Filled 2020-06-14 (×2): qty 0.6

## 2020-06-14 NOTE — Progress Notes (Signed)
PROGRESS NOTE                                                                             PROGRESS NOTE                                                                                                                                                                                                             Patient Demographics:    Tyrone Roman, is a 75 y.o. male, DOB - 06/23/1945, [id number]  Outpatient Primary MD for the patient is [name], MD    LOS - 2  Admit date - 06/20/2020    Chief Complaint  Patient presents with  . Shortness of Breath       Brief Narrative    HPI: Tyrone Roman is a 75 y.o. male with medical history significant of RA, CAD s/p PCI, HTN, HLD, COPD, BPH, and COVID in January 2022 with recurrent need for hospitalizations since.  Most recently, he was hospitalized from 4/20-28 with recurrent fever/SOB and worsening clot burden.  With pulm and ID consultation, he was treated with remdesivir, steroids, and Bebtelovimab.  Additionally, he had a breakthrough PE on Eliquis and was switched to Xarelto.  Interestingly, he has been tested for COVID and has tested positive on 1/24, 2/18, 4/4, (negative IgG Ab on 4/9), 4/20 (negative IgG Ab on 4/21), and 5/8; cycle counts have been consistently <30 indicating that he remains infectious and this is thought to be related to immunosuppression from RA, preventing him from the ability to clear the infection.  He was recommended to go to SNF and refused.  He returned today with increasing SOB despite home 3L  O2.  He reports that he is able to transfer and sit up in the chair but hasn't walked since January.  His symptoms were somewhat better at the time of last discharge but have been persistently present until overnight when he became acutely more SOB.  Some cough, but this is less prominent.  He feels like he is going  to die from this.  He was wearing his usual 3L but increased his O2 to 5L  without improvement.  He was as low as 71% while in the ER and is currently on 15L NRB O2.  -  Patient with significantly increased oxygen requirement on admission is currently on 40 L heated high flow nasal cannula and 15 L NRB.     Subjective:    [name] today with significant shortness of breath, fatigue, recent fever was 101 on 5/8.      Assessment  & Plan :    Principal Problem:   Acute respiratory failure with hypoxia (HCC) Active Problems:   Hyperlipidemia   COPD (chronic obstructive pulmonary disease) (HCC)   Essential hypertension   Post-COVID-19 syndrome manifesting as chronic cough   Pulmonary embolus (HCC)   SIRS (systemic inflammatory response syndrome) (HCC)   Thrush   Goals of care, counseling/discussion  Acute on chronic hypoxic respiratory failure due to acute COVID illness/pneumonia/sepsis present on admission due to COVID pneumonia -Patient with recurrent admissions in the past due to viral respiratory illness, will admissions over last 57-month, currently still testing positive on each admission, and as of last admissions his SARS 2  IgG was negative. -Difficultly immunocompromised, acute COVID versus recurrent COVID infection. -Continue with IV steroids. -Continue with IV remdesivir. -Did receive high titer convalescent plasma x2. -We will give monoclonal antibody given his chronic immunosuppressive status. -Incentive spirometry, flutter valve, will try to mobilize, he is on significant oxygen requirement, 40 L heated high flow nasal cannula and 15 NRB. -He is immunosuppressed, continue with Bactrim for PGP prophylaxis - anidulafungin since hx of c.glabrata colonization with Fungitell assay -Follow on ur legionella, RVP and PJP stain -CRP significantly elevated 8/24, he remains febrile, which does indicate acute infectious/inflammatory process going, most likely due to [place]. -Proalcitonin <0.10 on admission today is  077, if continues to trend up then  would consider Rx.    PE -Continue with Xarelto  Thrush -Prior sputum culture with Candida glabrata, he is currently on Andulifungin  RA -Has been on Rituxin and reports increased dose prior to development of COVID -He is on IV steroids  CAD -Continue ASA -Will need outpatient cardiology f/u unless acute issues arise while hospitalized  HTN -Continue Lopressor  HLD -Continue Zocor  DM -continue with ISS  Thrombocytopenia -likely  due to sepsis, monitor closely as he is on Xarelto  Hypokalemia -Repleted, recheck in a.m.  SpO2: 91 % O2 Flow Rate (L/min): 40 L/min (found on 50L, nasal cannula prongs not in the patient's nares. Maxton placed in proper position and liter flow decreased to 40L. SAts 92%. No change in WOB.) FiO2 (%): 100 %  Recent Labs  Lab 06/25/2020 0310 06/22/2020 0319 07/05/2020 0320 06/30/2020 0545 07/05/2020 1014 06/14/2020 1326 06/13/20 0118 06/14/20 0202  WBC  --   --  9.6  --   --   --  7.3 4.8  PLT  --   --  PLATELET CLUMPS NOTED ON SMEAR, UNABLE TO ESTIMATE  --   --   --  74* PLATELET CLUMPS NOTED ON SMEAR, UNABLE TO ESTIMATE  CRP  --   --   --   --  6.1*  --  8.6* 24.7*  DDIMER  --   --   --   --  0.78*  --  0.58* 1.48*  PROCALCITON  --   --   --   --  <0.10  --   --  0.77  AST  --   --  53*  --   --   --  32 36  ALT  --   --  61*  --   --   --  40 33  ALKPHOS  --   --  99  --   --   --  75 74  BILITOT  --   --  0.3  --   --   --  0.5 0.8  ALBUMIN  --   --  2.2*  --   --   --  2.1* 1.8*  INR  --   --  1.7*  --   --   --   --   --   LATICACIDVEN  --  4.5*  --  2.8* 2.7* 3.2* 3.0*  --   SARSCOV2NAA POSITIVE*  --   --   --   --   --   --   --     FiO2 (%):  [100 %] 100 %  ABG     Component Value Date/Time   PHART 7.480 (H) 06/13/2020 0605   PCO2ART 29.2 (L) 06/13/2020 0605   PO2ART 58.8 (L) 06/13/2020 0605   HCO3 21.1 06/13/2020 0605   TCO2 21 (L) 07-06-20 0643   ACIDBASEDEF 1.6 06/13/2020 0605   O2SAT 87.2 06/13/2020 0605            Condition - Extremely Guarded  Family Communication  : Daughters at bedside  Code Status : DNR  Consults  : PCCM, ID  Procedures  : 2 units of high titer convalescent plasma   Disposition Plan  :    Status is: Inpatient  Remains inpatient appropriate because:IV treatments appropriate due to intensity of illness or inability to take PO   Dispo:  Patient From: Home  Planned Disposition: Home  Medically stable for discharge: No        DVT Prophylaxis  :  xarelto  Lab Results  Component Value Date   PLT PLATELET CLUMPS NOTED ON SMEAR, UNABLE TO ESTIMATE 06/14/2020    Diet :  Diet Order            Diet Carb Modified Fluid consistency: Thin; Room service appropriate? Yes  Diet effective now                  Inpatient Medications  Scheduled Meds: . vitamin C  500 mg Oral Daily  . bebtelovimab EUA  175 mg Intravenous Once  . Chlorhexidine Gluconate Cloth  6 each Topical Daily  . docusate sodium  100 mg Oral BID  . feeding supplement  237 mL Oral TID BM  . hydrocortisone sod succinate (SOLU-CORTEF) inj  100 mg Intravenous Q8H  . insulin aspart  0-9 Units Subcutaneous Q4H  . insulin glargine  15 Units Subcutaneous QHS  . metoprolol tartrate  50 mg Oral BID  . midodrine  5 mg Oral TID WC  . mometasone-formoterol  2 puff Inhalation BID  . multivitamin with minerals  1 tablet Oral Daily  . pantoprazole  40 mg Oral Daily  . Rivaroxaban  15 mg Oral BID WC  . [START ON 06/16/2020] rivaroxaban  20 mg Oral Q supper  . simvastatin  20 mg Oral Daily  . sodium chloride flush  3 mL Intravenous Q12H  . sodium chloride flush  3 mL Intravenous Q12H  . sulfamethoxazole-trimethoprim  1 tablet Oral Once per day on Mon Wed Fri  . tamsulosin  0.4 mg Oral Daily  . umeclidinium  bromide  1 puff Inhalation Daily  . zinc sulfate  220 mg Oral Daily   Continuous Infusions: . sodium chloride    . sodium chloride    . [START ON 06/15/2020] anidulafungin    .  famotidine (PEPCID) IV    . potassium chloride 10 mEq (06/14/20 1402)  . remdesivir 100 mg in NS 100 mL 100 mg (06/14/20 1519)   PRN Meds:.sodium chloride, sodium chloride, acetaminophen, albuterol, albuterol, bisacodyl, chlorpheniramine-HYDROcodone, diphenhydrAMINE, EPINEPHrine, famotidine (PEPCID) IV, guaiFENesin-dextromethorphan, ipratropium-albuterol, melatonin, menthol-cetylpyridinium, methylPREDNISolone (SOLU-MEDROL) injection, ondansetron **OR** ondansetron (ZOFRAN) IV, oxyCODONE, polyethylene glycol, sodium chloride flush, sodium phosphate  Antibiotics  :    Anti-infectives (From admission, onward)   Start     Dose/Rate Route Frequency Ordered Stop   06/15/20 0830  anidulafungin (ERAXIS) 100 mg in sodium chloride 0.9 % 100 mL IVPB        100 mg 78 mL/hr over 100 Minutes Intravenous Every 24 hours 06/14/20 0738     06/14/20 1000  remdesivir 100 mg in sodium chloride 0.9 % 100 mL IVPB       "Followed by" Linked Group Details   100 mg 200 mL/hr over 30 Minutes Intravenous Daily 06/13/20 1058 July 08, 2020 0959   06/14/20 0830  anidulafungin (ERAXIS) 200 mg in sodium chloride 0.9 % 200 mL IVPB        200 mg 78 mL/hr over 200 Minutes Intravenous  Once 06/14/20 0738 06/14/20 1520   06/13/20 1145  remdesivir 200 mg in sodium chloride 0.9% 250 mL IVPB       "Followed by" Linked Group Details   200 mg 580 mL/hr over 30 Minutes Intravenous Once 06/13/20 1058 06/14/20 0738   06/13/20 0900  sulfamethoxazole-trimethoprim (BACTRIM DS) 800-160 MG per tablet 1 tablet        1 tablet Oral Once per day on Mon Wed Fri 06/21/2020 0946     06/28/2020 1045  fluconazole (DIFLUCAN) IVPB 200 mg  Status:  Discontinued        200 mg 100 mL/hr over 60 Minutes Intravenous Every 24 hours 06/09/2020 0946 06/14/20 0738         Emeline Gins Jackston [place] M.D on 06/14/2020 at 3:52 PM  To page go to [email or web address]  [facility] -  Office  [phone]      Objective:   Vitals:   06/14/20 0236 06/14/20 0414  06/14/20 0728 06/14/20 0854  BP:  96/74 99/80 98/79   Pulse:  78 79 81  Resp:  18 18 20   Temp:  98 F (36.7 C) 98.7 F (37.1 C)   TempSrc:  Axillary Axillary   SpO2: (!) 88% 90% 90% 91%    Wt Readings from Last 3 Encounters:  05/25/20 88 kg  05/09/20 88 kg  05/05/20 88.5 kg     Intake/Output Summary (Last 24 hours) at 06/14/2020 1552 Last data filed at 06/14/2020 0834 Gross per 24 hour  Intake --  Output 975 ml  Net -975 ml     Physical Exam  Awake Alert, Oriented X 3,  frail, deconditioned Symmetrical Chest wall movement, Good air movement bilaterally, scattered rhonchi, tachypneic in mild respiratory distress RRR,No Gallops,Rubs or new Murmurs, No Parasternal Heave +ve B.Sounds, Abd Soft, No tenderness, No rebound - guarding or rigidity. No Cyanosis, Clubbing or edema, No new Rash or bruise       Data Review:    CBC Recent Labs  Lab 06/21/2020 0320 06/26/2020 0643 06/13/20 0118 06/14/20 0202  WBC 9.6  --  7.3 4.8  HGB 14.9  11.9* 12.7* 11.5*  HCT 47.3 35.0* 39.8 34.6*  PLT PLATELET CLUMPS NOTED ON SMEAR, UNABLE TO ESTIMATE  --  74* PLATELET CLUMPS NOTED ON SMEAR, UNABLE TO ESTIMATE  MCV 91.1  --  89.6 87.4  MCH 28.7  --  28.6 29.0  MCHC 31.5  --  31.9 33.2  RDW 20.1*  --  19.9* 19.0*  LYMPHSABS 0.3*  --  0.1* 0.1*  MONOABS 0.2  --  0.1 0.0*  EOSABS 0.0  --  0.0 0.0  BASOSABS 0.0  --  0.0 0.0    Recent Labs  Lab 06/25/2020 0319 06/07/2020 0320 06/23/2020 0545 06/30/2020 0643 06/23/2020 1014 07/04/2020 1326 06/13/20 0118 06/14/20 0202  NA  --  132*  --  132*  --   --  129* 132*  K  --  4.6  --  4.3  --   --  3.9 2.8*  CL  --  101  --   --   --   --  97* 97*  CO2  --  23  --   --   --   --  25 22  GLUCOSE  --  233*  --   --   --   --  210* 310*  BUN  --  32*  --   --   --   --  29* 37*  CREATININE  --  0.98  --   --   --   --  0.92 1.07  CALCIUM  --  9.6  --   --   --   --  9.1 8.9  AST  --  53*  --   --   --   --  32 36  ALT  --  61*  --   --   --   --  40  33  ALKPHOS  --  99  --   --   --   --  75 74  BILITOT  --  0.3  --   --   --   --  0.5 0.8  ALBUMIN  --  2.2*  --   --   --   --  2.1* 1.8*  MG  --   --   --   --   --   --  1.8 1.8  CRP  --   --   --   --  6.1*  --  8.6* 24.7*  DDIMER  --   --   --   --  0.78*  --  0.58* 1.48*  PROCALCITON  --   --   --   --  <0.10  --   --  0.77  LATICACIDVEN 4.5*  --  2.8*  --  2.7* 3.2* 3.0*  --   INR  --  1.7*  --   --   --   --   --   --     ------------------------------------------------------------------------------------------------------------------ No results for input(s): CHOL, HDL, LDLCALC, TRIG, CHOLHDL, LDLDIRECT in the last 72 hours.  Lab Results  Component Value Date   HGBA1C 6.3 (H) 05/12/2020   ------------------------------------------------------------------------------------------------------------------ No results for input(s): TSH, T4TOTAL, T3FREE, THYROIDAB in the last 72 hours.  Invalid input(s): FREET3  Cardiac Enzymes No results for input(s): CKMB, TROPONINI, MYOGLOBIN in the last 168 hours.  Invalid input(s): CK ------------------------------------------------------------------------------------------------------------------    Component Value Date/Time   BNP 294.8 (H) 06/01/2020 0250    Micro Results Recent Results (from the past 240 hour(s))  Resp Panel by  RT-PCR (Flu A&B, Covid) Nasopharyngeal Swab     Status: Abnormal   Collection Time: 06/20/2020  3:10 AM   Specimen: Nasopharyngeal Swab; Nasopharyngeal(NP) swabs in vial transport medium  Result Value Ref Range Status   SARS Coronavirus 2 by RT PCR POSITIVE (A) NEGATIVE Final    Comment: RESULT CALLED TO, READ BACK BY AND VERIFIED WITH: RN J VASQUEZ AT [id number] 06/19/2020 BY L BENFIELD (NOTE) SARS-CoV-2 target nucleic acids are DETECTED.  The SARS-CoV-2 RNA is generally detectable in upper respiratory specimens during the acute phase of infection. Positive results are indicative of the presence of the  identified virus, but do not rule out bacterial infection or co-infection with other pathogens not detected by the test. Clinical correlation with patient history and other diagnostic information is necessary to determine patient infection status. The expected result is Negative.  Fact Sheet for Patients: EntrepreneurPulse.com.au  Fact Sheet for Healthcare Providers: IncredibleEmployment.be  This test is not yet approved or cleared by the [place] FDA and  has been authorized for detection and/or diagnosis of SARS-CoV-2 by FDA under an Emergency Use Authorization (EUA).  This EUA will remain in effect (meaning this t est can be used) for the duration of  the COVID-19 declaration under Section 564(b)(1) of the Act, 21 U.S.C. section 360bbb-3(b)(1), unless the authorization is terminated or revoked sooner.     Influenza A by PCR NEGATIVE NEGATIVE Final   Influenza B by PCR NEGATIVE NEGATIVE Final    Comment: (NOTE) The Xpert Xpress SARS-CoV-2/FLU/RSV plus assay is intended as an aid in the diagnosis of influenza from Nasopharyngeal swab specimens and should not be used as a sole basis for treatment. Nasal washings and aspirates are unacceptable for Xpert Xpress SARS-CoV-2/FLU/RSV testing.  Fact Sheet for Patients: EntrepreneurPulse.com.au  Fact Sheet for Healthcare Providers: IncredibleEmployment.be  This test is not yet approved or cleared by the [place] FDA and has been authorized for detection and/or diagnosis of SARS-CoV-2 by FDA under an Emergency Use Authorization (EUA). This EUA will remain in effect (meaning this test can be used) for the duration of the COVID-19 declaration under Section 564(b)(1) of the Act, 21 U.S.C. section 360bbb-3(b)(1), unless the authorization is terminated or revoked.  Performed at [facility], [place] [address] [place], [place] [address]   Urine culture      Status: Abnormal   Collection Time: 06/26/2020  3:37 AM   Specimen: Urine, Clean Catch  Result Value Ref Range Status   Specimen Description URINE, CLEAN CATCH  Final   Special Requests   Final    NONE Performed at [facility], [place] [address], [place], [place] [address]    Culture (A)  Final    80,000 COLONIES/mL STAPHYLOCOCCUS EPIDERMIDIS 50,000 COLONIES/mL ENTEROCOCCUS FAECALIS    Report Status 06/14/2020 FINAL  Final   Organism ID, Bacteria STAPHYLOCOCCUS EPIDERMIDIS (A)  Final   Organism ID, Bacteria ENTEROCOCCUS FAECALIS (A)  Final      Susceptibility   Enterococcus faecalis - MIC*    AMPICILLIN <=2 SENSITIVE Sensitive     NITROFURANTOIN <=16 SENSITIVE Sensitive     VANCOMYCIN 1 SENSITIVE Sensitive     * 50,000 COLONIES/mL ENTEROCOCCUS FAECALIS   Staphylococcus epidermidis - MIC*    CIPROFLOXACIN >=8 RESISTANT Resistant     GENTAMICIN <=0.5 SENSITIVE Sensitive     NITROFURANTOIN <=16 SENSITIVE Sensitive     OXACILLIN >=4 RESISTANT Resistant     TETRACYCLINE >=16 RESISTANT Resistant     VANCOMYCIN 1 SENSITIVE  Sensitive     TRIMETH/SULFA 80 RESISTANT Resistant     CLINDAMYCIN <=0.25 SENSITIVE Sensitive     RIFAMPIN <=0.5 SENSITIVE Sensitive     Inducible Clindamycin NEGATIVE Sensitive     * 80,000 COLONIES/mL STAPHYLOCOCCUS EPIDERMIDIS  Blood culture (routine single)     Status: None (Preliminary result)   Collection Time: 17-Jun-2020  3:40 AM   Specimen: BLOOD RIGHT ARM  Result Value Ref Range Status   Specimen Description BLOOD RIGHT ARM  Final   Special Requests   Final    BOTTLES DRAWN AEROBIC AND ANAEROBIC Blood Culture results may not be optimal due to an inadequate volume of blood received in culture bottles   Culture   Final    NO GROWTH 2 DAYS Performed at [facility], [address], [place], [place] [address]    Report Status PENDING  Incomplete  Respiratory (~20 pathogens) panel by PCR     Status: None   Collection Time: 06/13/20  4:09 PM    Specimen: Nasopharyngeal Swab; Respiratory  Result Value Ref Range Status   Adenovirus NOT DETECTED NOT DETECTED Final   Coronavirus 229E NOT DETECTED NOT DETECTED Final    Comment: (NOTE) The Coronavirus on the Respiratory Panel, DOES NOT test for the novel  Coronavirus (2019 nCoV)    Coronavirus HKU1 NOT DETECTED NOT DETECTED Final   Coronavirus NL63 NOT DETECTED NOT DETECTED Final   Coronavirus OC43 NOT DETECTED NOT DETECTED Final   Metapneumovirus NOT DETECTED NOT DETECTED Final   Rhinovirus / Enterovirus NOT DETECTED NOT DETECTED Final   Influenza A NOT DETECTED NOT DETECTED Final   Influenza B NOT DETECTED NOT DETECTED Final   Parainfluenza Virus 1 NOT DETECTED NOT DETECTED Final   Parainfluenza Virus 2 NOT DETECTED NOT DETECTED Final   Parainfluenza Virus 3 NOT DETECTED NOT DETECTED Final   Parainfluenza Virus 4 NOT DETECTED NOT DETECTED Final   Respiratory Syncytial Virus NOT DETECTED NOT DETECTED Final   Bordetella pertussis NOT DETECTED NOT DETECTED Final   Bordetella Parapertussis NOT DETECTED NOT DETECTED Final   Chlamydophila pneumoniae NOT DETECTED NOT DETECTED Final   Mycoplasma pneumoniae NOT DETECTED NOT DETECTED Final    Comment: Performed at [facility], [address], [place], [place] [address]    Radiology Reports CT ANGIO CHEST PE W OR WO CONTRAST  Result Date: 17-Jun-2020 CLINICAL DATA:  I more foci of EXAM: CT ANGIOGRAPHY CHEST WITH CONTRAST TECHNIQUE: Multidetector CT imaging of the chest was performed using the standard protocol during bolus administration of intravenous contrast. Multiplanar CT image reconstructions and MIPs were obtained to evaluate the vascular anatomy. CONTRAST:  40mL OMNIPAQUE IOHEXOL 350 MG/ML SOLN COMPARISON:  CT angiography 05/14/2020, CT angiography 03/26/2020, CT angiography 11/27/2010 FINDINGS: Cardiovascular: Satisfactory opacification of the pulmonary arteries to the segmental level. Filling defect in the right lower lobe  segmental pulmonary artery (5:52). No central pulmonary embolus. No definite left pulmonary embolus. Left ventricular hypertrophy. No pericardial effusion. Four-vessel coronary artery calcification. Mediastinum/Nodes: No enlarged mediastinal, hilar, or axillary lymph nodes. Thyroid gland, trachea, and esophagus demonstrate no significant findings. Lungs/Pleura: Redemonstration of cystic changes that are more apparent within the upper lobes likely related to emphysematous changes. Interlobular septal wall thickening. Redemonstration of peribronchovascular ground-glass airspace opacities and consolidation with associated bronchiectasis. No pleural effusion. No pneumothorax. Upper Abdomen: No acute abnormality. Musculoskeletal: No abdominal wall hernia or abnormality. No suspicious lytic or blastic osseous lesions. No acute displaced fracture. Multilevel degenerative changes of the spine. Review  of the MIP images confirms the above findings. IMPRESSION: 1. Residual segmental right lower lobe pulmonary embolus with interval resolution of the central pulmonary embolus component. No associated heart strain. No definite pulmonary infarction. 2. Diffuse pulmonary findings similar to at least as far back as 03/16/2020. Findings could be infectious/inflammatory. Underlying malignancy not excluded. 3. Left ventricular hypertrophy. 4. Aortic Atherosclerosis (ICD10-I70.0) and Emphysema (ICD10-J43.9). 5. Four-vessel coronary artery calcifications. These results were called by telephone at the time of interpretation on 06/19/2020 at 5:36 am to provider [facility] , who verbally acknowledged these results. Electronically Signed   By: Iven Finn M.D.   On: 06/21/2020 05:41   CT Angio Chest PE W and/or Wo Contrast  Result Date: 05/25/2020 CLINICAL DATA:  75 year old male with shortness of breath and chest pain. Concern for pulmonary embolism. EXAM: CT ANGIOGRAPHY CHEST WITH CONTRAST TECHNIQUE: Multidetector CT imaging  of the chest was performed using the standard protocol during bolus administration of intravenous contrast. Multiplanar CT image reconstructions and MIPs were obtained to evaluate the vascular anatomy. CONTRAST:  72mL OMNIPAQUE IOHEXOL 350 MG/ML SOLN COMPARISON:  Chest CT dated 05/14/2020. FINDINGS: Cardiovascular: Mild cardiomegaly. No pericardial effusion. There is coronary vascular calcification. Mild atherosclerotic calcification of the thoracic aorta. Linear nonocclusive thrombus noted extending from the central right pulmonary artery into the lobar and segmental branches of the right lower lobe and right upper lobe. This is new since the prior CT. No CT evidence of right heart straining. Mediastinum/Nodes: Top-normal right hilar lymph nodes. The esophagus and the thyroid gland are grossly unremarkable. No mediastinal fluid collection. Lungs/Pleura: Bilateral patchy and streaky densities similar to prior CT. Calcified pleural plaque noted in the left upper lobe anteriorly. No pleural effusion pneumothorax. The central airways are patent. Upper Abdomen: No acute abnormality. Musculoskeletal: No chest wall abnormality. No acute or significant osseous findings. Review of the MIP images confirms the above findings. IMPRESSION: 1. Pulmonary artery embolus extending from the central right pulmonary artery into the lobar and segmental branches of the right lower lobe and right upper lobe, new since the prior CT. No CT evidence of right heart straining. 2. Bilateral patchy and streaky densities similar to prior CT. 3. Aortic Atherosclerosis (ICD10-I70.0). These results were called by telephone at the time of interpretation on 05/25/2020 at 6:39 pm to provider DAVID YAO , who verbally acknowledged these results. Electronically Signed   By: Anner Crete M.D.   On: 05/25/2020 18:43   MR BRAIN W WO CONTRAST  Result Date: 05/16/2020 CLINICAL DATA:  Delirium; fever with unknown origin, altered mental status, evaluate  for encephalitis. EXAM: MRI HEAD WITHOUT AND WITH CONTRAST TECHNIQUE: Multiplanar, multiecho pulse sequences of the brain and surrounding structures were obtained without and with intravenous contrast. CONTRAST:  38mL GADAVIST GADOBUTROL 1 MMOL/ML IV SOLN COMPARISON:  Prior head CT examinations 04/02/2020 and earlier. Brain MRI 12/13/2017. FINDINGS: Brain: Mild cerebral and cerebellar atrophy. AP punctate focus of restricted diffusion is questioned within the right parietal lobe (versus artifact) (series 3, image 33). Additionally, there is apparent subtle restricted diffusion along the midline callosal splenium, slightly eccentric to the left. A few small foci of T2/FLAIR hyperintensity within the bilateral cerebral white matter and right thalamus are nonspecific, but likely reflect minimal chronic small vessel ischemic disease. Redemonstrated left retrocerebellar arachnoid cyst. There is no acute infarct. No evidence of intracranial mass. No chronic intracranial blood products. No extra-axial fluid collection. No midline shift. No abnormal intracranial enhancement. Vascular: Expected proximal arterial flow voids. Skull and  upper cervical spine: No focal marrow lesion. Incompletely assessed cervical spondylosis. Suspected C4-C5 vertebral body ankylosis. Sinuses/Orbits: Visualized orbits show no acute finding. Bilateral lens replacements. Mild paranasal sinus mucosal thickening, most notably within the bilateral ethmoid air cells. Small left maxillary sinus mucous retention cyst. Other: Right mastoid effusion. IMPRESSION: Punctate acute infarct versus artifact within the right parietal lobe. Additionally, there is apparent subtle restricted diffusion within the midline callosal splenium, slightly eccentric to the left. This may reflect a small focus of acute ischemia or artifact. Alternatively, this may reflect a cytotoxic lesion of the corpus callosum (which has a broad range of potential etiologies and clinical  associations), although the appearance would be atypical for this entity. Background mild parenchymal atrophy and chronic small vessel ischemic disease. Mild paranasal sinus disease as described. Right mastoid effusion. Electronically Signed   By: Kellie Simmering DO   On: 05/16/2020 18:51   MR CERVICAL SPINE W WO CONTRAST  Result Date: 05/17/2020 CLINICAL DATA:  Initial screening for possible epidural abscess. EXAM: MRI CERVICAL SPINE WITHOUT AND WITH CONTRAST TECHNIQUE: Multiplanar and multiecho pulse sequences of the cervical spine, to include the craniocervical junction and cervicothoracic junction, were obtained without and with intravenous contrast. CONTRAST:  64mL GADAVIST GADOBUTROL 1 MMOL/ML IV SOLN COMPARISON:  Prior MRI from 03/24/2010. FINDINGS: Alignment: Reversal of the normal cervical lordosis with apex at C3-4. Trace anterolisthesis of C3 on C4 and C7 on T1, chronic and facet mediated. Vertebrae: Vertebral body height maintained without acute or chronic fracture. C4 and C5 vertebral bodies are largely ankylosed, likely degenerative. Bone marrow signal intensity within normal limits. No discrete or worrisome osseous lesions. No abnormal marrow edema or enhancement. No findings to suggest osteomyelitis discitis or septic arthritis. Cord: Normal signal and morphology. No epidural abscess or other collection. Posterior Fossa, vertebral arteries, paraspinal tissues: Probable benign retro cerebellar arachnoid cyst noted. Visualized brain and posterior fossa otherwise unremarkable. Craniocervical junction within normal limits. Paraspinous and prevertebral soft tissues normal. Normal flow voids seen within the vertebral arteries bilaterally. Disc levels: C2-C3: Negative interspace. Right worse than left facet hypertrophy. No spinal stenosis. Mild right C3 foraminal narrowing. Left neural foramina remains patent. C3-C4: Broad-based posterior disc osteophyte flattens and effaces the ventral thecal sac. Mild  flattening of the ventral cord without cord signal changes. Mild spinal stenosis. Superimposed bilateral facet hypertrophy with left greater than right uncovertebral spurring. Severe left with moderate right C4 foraminal stenosis. C4-C5: C4 and C5 vertebral bodies are largely ankylosed. Broad posterior endplate osseous ridging flattens and effaces the ventral thecal sac. Mild spinal stenosis without cord impingement. Moderate right worse than left C5 foraminal stenosis. C5-C6: Degenerative intervertebral disc space narrowing with diffuse disc osteophyte complex. Mild flattening of the ventral thecal sac without significant spinal stenosis. Moderate left worse than right C6 foraminal narrowing. C6-C7: Degenerative intervertebral disc space narrowing with diffuse disc osteophyte complex. No significant spinal stenosis. Severe left with moderate right C7 foraminal stenosis. C7-T1: Trace anterolisthesis. No significant disc bulge. Moderate bilateral facet and ligament flavum hypertrophy. No significant spinal stenosis. Foramina remain patent. IMPRESSION: 1. No evidence for osteomyelitis discitis or septic arthritis within the cervical spine. No epidural abscess or other infection. 2. Multilevel cervical spondylosis with resultant mild spinal stenosis at C3-4 and C4-5. 3. Multifactorial degenerative changes with resultant multilevel foraminal narrowing as above. Notable findings include severe left with moderate right C4 foraminal stenosis, moderate bilateral C5 and C6 foraminal narrowing, with severe left and moderate right C7 foraminal stenosis. Electronically Signed  By: Jeannine Boga M.D.   On: 05/17/2020 21:40   MR THORACIC SPINE W WO CONTRAST  Result Date: 05/17/2020 CLINICAL DATA:  Initial screening for possible infection, epidural abscess. EXAM: MRI THORACIC WITHOUT AND WITH CONTRAST TECHNIQUE: Multiplanar and multiecho pulse sequences of the thoracic spine were obtained without and with intravenous  contrast. CONTRAST:  33mL GADAVIST GADOBUTROL 1 MMOL/ML IV SOLN COMPARISON:  None available. FINDINGS: Alignment: Straightening of the normal thoracic kyphosis. No listhesis. Vertebrae: Vertebral body height maintained without acute or chronic fracture. Bone marrow signal intensity within normal limits. Few scattered benign hemangiomata noted. No worrisome osseous lesions. No abnormal marrow edema or enhancement. No findings to suggest osteomyelitis discitis or septic arthritis. Cord: Normal signal and morphology. No epidural abscess or other collection. No abnormal enhancement. Paraspinal and other soft tissues: Paraspinous soft tissues demonstrate no acute finding. Small layering bilateral pleural effusions noted, left slightly larger than right. T2 hyperintense benign appearing cyst partially visualized extending from the right kidney. Visualized visceral structures otherwise grossly unremarkable. Disc levels: T1-2:  Unremarkable. T2-3: Tiny right paracentral disc extrusion with superior migration. Mild right-sided facet hypertrophy. No spinal stenosis. Foramina remain patent. T3-4: Minimal disc bulge. No spinal stenosis. Foramina remain patent. T4-5: Normal interspace. Mild right-sided facet hypertrophy. No stenosis. T5-6:  Normal interspace.  Mild facet hypertrophy.  No stenosis. T6-7:  Unremarkable. T7-8: Mild degenerative intervertebral disc space narrowing with disc bulge. No spinal stenosis. Foramina remain patent. T8-9: Mild disc bulge. No spinal stenosis. Foramina remain patent. T9-10: Mild disc bulge with posterior element hypertrophy. No significant spinal stenosis. Mild bilateral foraminal narrowing. T10-11: Diffuse disc bulge, asymmetric to the right. Moderate facet and ligament flavum hypertrophy. Resultant mild to moderate spinal stenosis with mild cord flattening. No cord signal changes. Moderate bilateral foraminal narrowing. T11-12: Negative interspace. Bilateral facet hypertrophy. No significant  spinal stenosis. Mild bilateral foraminal narrowing. T12-L1: Disc desiccation with small endplate Schmorl's node deformities. No stenosis. IMPRESSION: 1. No MRI evidence for osteomyelitis discitis or septic arthritis within the thoracic spine. No epidural abscess or other infection. 2. Disc bulging with facet hypertrophy at T10-11 with resultant mild to moderate spinal stenosis and moderate bilateral foraminal narrowing. 3. Additional mild spondylosis elsewhere within the thoracic spine without significant stenosis. 4. Small layering bilateral pleural effusions, left slightly larger than right. Electronically Signed   By: Jeannine Boga M.D.   On: 05/17/2020 21:52   CT ABDOMEN PELVIS W CONTRAST  Result Date: 05/15/2020 CLINICAL DATA:  Sepsis.  Fever of unknown origin. EXAM: CT ABDOMEN AND PELVIS WITH CONTRAST TECHNIQUE: Multidetector CT imaging of the abdomen and pelvis was performed using the standard protocol following bolus administration of intravenous contrast. CONTRAST:  143mL OMNIPAQUE IOHEXOL 300 MG/ML  SOLN COMPARISON:  04/02/2020.  Chest CTA obtained earlier today. FINDINGS: Lower chest: Progressive pulmonary embolism on the right, with emboli currently demonstrated in the distal right main pulmonary artery and significantly more clot in the intersegmental and proximal right lower lobe pulmonary arteries. Bilateral lung densities and atelectasis with improvement since this morning. Hepatobiliary: Diffuse low density of the liver relative to the spleen. Normal appearing gallbladder. Pancreas: Unremarkable. No pancreatic ductal dilatation or surrounding inflammatory changes. Spleen: Normal in size without focal abnormality. Adrenals/Urinary Tract: Normal appearing adrenal glands. Bilateral renal cysts. Multiple small bilateral renal calculi. No bladder or ureteral calculi are seen. Excreted contrast in the urinary bladder from the CTA earlier today. Stomach/Bowel: Multiple colonic diverticula  without evidence of diverticulitis. Unremarkable stomach, small bowel and appendix. Vascular/Lymphatic:  Atheromatous arterial calcifications without aneurysm. No enlarged lymph nodes. Two renal arteries on the left. Reproductive: Mildly enlarged prostate gland. Other: Small bilateral inguinal hernias containing fat and very small umbilical artery containing fat. Musculoskeletal: Stable lumbar and lower thoracic spine degenerative changes and fusion of the L1 and L2 vertebral bodies. IMPRESSION: 1. Progressive pulmonary embolism on the right, with emboli currently demonstrated in the distal right main pulmonary artery and significantly more clot in the intersegmental and proximal right lower lobe pulmonary arteries. These results will be called to the ordering clinician or representative by the Radiologist Assistant, and communication documented in the PACS or [employer]. 2. Lung densities and atelectasis with improvement since this morning. 3. Diffuse hepatic steatosis. 4. Multiple small, nonobstructing bilateral renal calculi. 5. Colonic diverticulosis. Electronically Signed   By: Claudie Revering M.D.   On: 05/15/2020 22:17   DG CHEST PORT 1 VIEW  Result Date: 06/13/2020 CLINICAL DATA:  Pneumonia due to COVID-19 virus.  Fever. EXAM: PORTABLE CHEST 1 VIEW COMPARISON:  1 day prior FINDINGS: Midline trachea. Normal heart size for level of inspiration. No pleural effusion or pneumothorax. Worsened, right greater than left interstitial opacities. Relatively diffuse, with more normal aerated left upper lung. IMPRESSION: Worsened COVID-19 pneumonia. Electronically Signed   By: Abigail Miyamoto M.D.   On: 06/13/2020 08:17   DG Chest Port 1 View  Result Date: Jun 25, 2020 CLINICAL DATA:  Sepsis EXAM: PORTABLE CHEST 1 VIEW COMPARISON:  05/30/2020 FINDINGS: Bilateral interstitial opacities, unchanged. Normal cardiomediastinal contours. No pneumothorax or sizable pleural effusion. IMPRESSION: Unchanged bilateral  interstitial opacities. Electronically Signed   By: Ulyses Jarred M.D.   On: 06-25-20 03:48   DG Chest Port 1 View  Result Date: 05/30/2020 CLINICAL DATA:  Difficulty breathing, low O2 sats, history of COVID infection. EXAM: PORTABLE CHEST 1 VIEW COMPARISON:  05/29/2020 and CT chest 05/25/2020. FINDINGS: Heart size stable. Patchy mixed interstitial and airspace opacification bilaterally, increased from 05/29/2020. No pleural fluid. IMPRESSION: Worsening COVID-19 pneumonia. Electronically Signed   By: Lorin Picket M.D.   On: 05/30/2020 09:11   DG Chest Port 1 View  Result Date: 05/29/2020 CLINICAL DATA:  Shortness of breath.  Covid-19 viral infection. EXAM: PORTABLE CHEST 1 VIEW COMPARISON:  05/28/2020 FINDINGS: Heart size remains within normal limits. Diffuse interstitial infiltrates are again seen with mild patchy airspace opacities in the left lung base. These show no significant change since previous study. No evidence of pleural effusion. IMPRESSION: No significant change in diffuse interstitial infiltrates with mild patchy airspace disease in left lower lung. Electronically Signed   By: Marlaine Hind M.D.   On: 05/29/2020 08:36   DG Chest Port 1 View  Result Date: 05/28/2020 CLINICAL DATA:  Shortness of breath EXAM: PORTABLE CHEST 1 VIEW COMPARISON:  Three days ago FINDINGS: Patchy bilateral pulmonary infiltrate without convincing progression. Lung volumes are low. No visible effusion or air leak. Normal heart size and stable mediastinal contours. IMPRESSION: Similar degree of bilateral pulmonary infiltrates. No new abnormality. Electronically Signed   By: Monte Fantasia M.D.   On: 05/28/2020 07:38   DG Chest Port 1 View  Result Date: 05/25/2020 CLINICAL DATA:  75 year old male with concern for sepsis. EXAM: PORTABLE CHEST 1 VIEW COMPARISON:  Chest CT dated 05/14/2020. FINDINGS: Diffuse bilateral streaky interstitial densities worsened since 05/09/2020 but relatively similar to 05/10/2020  concerning for residual infiltrate, likely on the background of chronic changes. Clinical correlation and follow-up recommended. No lobar consolidation, pleural effusion, pneumothorax. The cardiac silhouette is within limits.  No acute osseous pathology. IMPRESSION: Persistent bilateral infiltrates. Electronically Signed   By: Anner Crete M.D.   On: 05/25/2020 16:24   ECHOCARDIOGRAM COMPLETE  Result Date: 05/26/2020    ECHOCARDIOGRAM REPORT   Patient Name:   LAQUANE DELAUGHTER Date of Exam: 05/26/2020 Medical Rec #:  [id number]      Height:       74.0 in Accession #:    [id number]     Weight:       194.0 lb Date of Birth:  13-Jan-1946      BSA:          2.146 m Patient Age:    28 years       BP:           129/92 mmHg Patient Gender: M              HR:           84 bpm. Exam Location:  Inpatient Procedure: 2D Echo, Cardiac Doppler, Color Doppler and Intracardiac            Opacification Agent Indications:    Pulmonary Embolus I26.09  History:        Patient has prior history of Echocardiogram examinations, most                 recent 04/03/2020. CAD and Previous Myocardial Infarction, COPD,                 Signs/Symptoms:Shortness of Breath; Risk Factors:Hypertension                 and Dyslipidemia. COVID 19. Sepsis, due to unspecified organism,                 unspecified whether acute organ dysfunction present                 Multiple subsegmental pulmonary emboli without acute cor                 pulmonale.  Sonographer:    Darlina Sicilian RDCS Referring Phys: [id number] [place]  1. Left ventricular ejection fraction, by estimation, is 50 to 55%. The left ventricle has low normal function. The left ventricle has no regional wall motion abnormalities. There is severe asymmetric left ventricular hypertrophy of the basal and septal  segments. Left ventricular diastolic parameters are consistent with Grade II diastolic dysfunction (pseudonormalization). Elevated left ventricular end-diastolic  pressure.  2. Right ventricular systolic function is normal. The right ventricular size is normal.  3. The mitral valve is abnormal. No evidence of mitral valve regurgitation. No evidence of mitral stenosis.  4. The aortic valve is tricuspid. There is mild calcification of the aortic valve. Aortic valve regurgitation is not visualized. Mild aortic valve sclerosis is present, with no evidence of aortic valve stenosis.  5. Aortic dilatation noted. There is mild dilatation of the aortic root, measuring 41 mm.  6. The inferior vena cava is normal in size with greater than 50% respiratory variability, suggesting right atrial pressure of 3 mmHg. FINDINGS  Left Ventricle: Left ventricular ejection fraction, by estimation, is 50 to 55%. The left ventricle has low normal function. The left ventricle has no regional wall motion abnormalities. Definity contrast agent was given IV to delineate the left ventricular endocardial borders. The left ventricular internal cavity size was normal in size. There is severe asymmetric left ventricular hypertrophy of the basal and septal segments. Left ventricular diastolic parameters are consistent with Grade II diastolic  dysfunction (pseudonormalization). Elevated left ventricular end-diastolic pressure. Right Ventricle: The right ventricular size is normal. No increase in right ventricular wall thickness. Right ventricular systolic function is normal. Left Atrium: Left atrial size was normal in size. Right Atrium: Right atrial size was normal in size. Pericardium: There is no evidence of pericardial effusion. Mitral Valve: The mitral valve is abnormal. There is mild thickening of the mitral valve leaflet(s). There is mild calcification of the mitral valve leaflet(s). Mild mitral annular calcification. No evidence of mitral valve regurgitation. No evidence of mitral valve stenosis. Tricuspid Valve: The tricuspid valve is normal in structure. Tricuspid valve regurgitation is not  demonstrated. No evidence of tricuspid stenosis. Aortic Valve: The aortic valve is tricuspid. There is mild calcification of the aortic valve. Aortic valve regurgitation is not visualized. Mild aortic valve sclerosis is present, with no evidence of aortic valve stenosis. Pulmonic Valve: The pulmonic valve was normal in structure. Pulmonic valve regurgitation is not visualized. No evidence of pulmonic stenosis. Aorta: The aortic root is normal in size and structure and aortic dilatation noted. There is mild dilatation of the aortic root, measuring 41 mm. Venous: The inferior vena cava is normal in size with greater than 50% respiratory variability, suggesting right atrial pressure of 3 mmHg. IAS/Shunts: No atrial level shunt detected by color flow Doppler.  LEFT VENTRICLE PLAX 2D LVIDd:         3.20 cm     Diastology LVIDs:         2.60 cm     LV e' medial:    3.15 cm/s LV PW:         1.40 cm     LV E/e' medial:  17.1 LV IVS:        2.00 cm     LV e' lateral:   3.37 cm/s LVOT diam:     2.00 cm     LV E/e' lateral: 16.0 LV SV:         41 LV SV Index:   19 LVOT Area:     3.14 cm  LV Volumes (MOD) LV vol d, MOD A2C: 91.2 ml LV vol d, MOD A4C: 78.4 ml LV vol s, MOD A2C: 43.8 ml LV vol s, MOD A4C: 36.8 ml LV SV MOD A2C:     47.4 ml LV SV MOD A4C:     78.4 ml LV SV MOD BP:      44.4 ml RIGHT VENTRICLE RV S prime:     8.16 cm/s LEFT ATRIUM             Index       RIGHT ATRIUM          Index LA diam:        3.00 cm 1.40 cm/m  RA Area:     6.38 cm LA Vol (A2C):   20.1 ml 9.37 ml/m  RA Volume:   7.69 ml  3.58 ml/m LA Vol (A4C):   21.3 ml 9.93 ml/m LA Biplane Vol: 21.6 ml 10.07 ml/m  AORTIC VALVE LVOT Vmax:   80.50 cm/s LVOT Vmean:  60.800 cm/s LVOT VTI:    0.130 m  AORTA Ao Root diam: 4.10 cm Ao Asc diam:  3.40 cm MITRAL VALVE MV Area (PHT): 3.01 cm    SHUNTS MV Decel Time: 252 msec    Systemic VTI:  0.13 m MV E velocity: 53.80 cm/s  Systemic Diam: 2.00 cm MV A velocity: 81.50 cm/s MV E/A ratio:  0.66 Jenkins Rouge MD  Electronically signed by Jenkins Rouge MD Signature Date/Time: 05/26/2020/2:30:51 PM    Final    VAS [place] LOWER EXTREMITY VENOUS (DVT)  Result Date: 05/26/2020  Lower Venous DVT Study Indications: Edema, and Swelling.  Comparison Study: 05/11/20 previous Performing Technologist: Abram Sander RVS  Examination Guidelines: A complete evaluation includes B-mode imaging, spectral Doppler, color Doppler, and power Doppler as needed of all accessible portions of each vessel. Bilateral testing is considered an integral part of a complete examination. Limited examinations for reoccurring indications may be performed as noted. The reflux portion of the exam is performed with the patient in reverse Trendelenburg.  +---------+---------------+---------+-----------+----------+--------------+ RIGHT    CompressibilityPhasicitySpontaneityPropertiesThrombus Aging +---------+---------------+---------+-----------+----------+--------------+ CFV      Full           Yes      Yes                                 +---------+---------------+---------+-----------+----------+--------------+ SFJ      Full                                                        +---------+---------------+---------+-----------+----------+--------------+ FV Prox  Full                                                        +---------+---------------+---------+-----------+----------+--------------+ FV Mid   Full                                                        +---------+---------------+---------+-----------+----------+--------------+ FV DistalFull                                                        +---------+---------------+---------+-----------+----------+--------------+ PFV      Full                                                        +---------+---------------+---------+-----------+----------+--------------+ POP      Full           Yes      Yes                                  +---------+---------------+---------+-----------+----------+--------------+ PTV      Full                                                        +---------+---------------+---------+-----------+----------+--------------+ PERO  Full                                                        +---------+---------------+---------+-----------+----------+--------------+   +---------+---------------+---------+-----------+----------+-----------------+ LEFT     CompressibilityPhasicitySpontaneityPropertiesThrombus Aging    +---------+---------------+---------+-----------+----------+-----------------+ CFV      Full           Yes      Yes                                    +---------+---------------+---------+-----------+----------+-----------------+ SFJ      Full                                                           +---------+---------------+---------+-----------+----------+-----------------+ FV Prox  Full                                                           +---------+---------------+---------+-----------+----------+-----------------+ FV Mid   Full                                                           +---------+---------------+---------+-----------+----------+-----------------+ FV DistalFull                                                           +---------+---------------+---------+-----------+----------+-----------------+ PFV      Full                                                           +---------+---------------+---------+-----------+----------+-----------------+ POP      None           No       No                   Age Indeterminate +---------+---------------+---------+-----------+----------+-----------------+ PTV      None                                         Age Indeterminate +---------+---------------+---------+-----------+----------+-----------------+ PERO     None                                         Age  Indeterminate +---------+---------------+---------+-----------+----------+-----------------+  Summary: RIGHT: - There is no evidence of deep vein thrombosis in the lower extremity.  - No cystic structure found in the popliteal fossa.  LEFT: - Findings consistent with age indeterminate deep vein thrombosis involving the left popliteal vein, left posterior tibial veins, and left peroneal veins. - No cystic structure found in the popliteal fossa.  *See table(s) above for measurements and observations. Electronically signed by Harold Barban MD on 05/26/2020 at 9:28:41 PM.    Final

## 2020-06-14 NOTE — Progress Notes (Signed)
PCCM Brief Note  Discussed case w Dr Waldron Labs and Dr Baxter Flattery this am.   He still has high O2 needs, eval consistent with persistent COVID infection, inability to clear. His infiltrates on CT chest are unchanged, severe going back to February - ? How much of this is active infection vs fibrotic change, etc.   He is going to get repeat mAb today. Unfortunately not much else to add. I agree with his DNR status, transition to palliative approach if no change.   Please call us if we can help in any way.   Baltazar Apo, MD, PhD 06/14/2020, 10:51 AM [facility] [phone] or if no answer before 7:00PM call [phone] For any issues after 7:00PM please call eLink [phone]

## 2020-06-14 NOTE — Progress Notes (Signed)
PT Cancellation Note  Patient Details Name: Tyrone Roman MRN: [id number] DOB: 1946-01-09   Cancelled Treatment:    Reason Eval/Treat Not Completed: Medical issues which prohibited therapy discussed case with RN- patient remains very medically unstable and still not appropriate for PT. Will f/u on next date of service.    [name], DPT, PN1   Supplemental Physical Therapist [facility]    Pager [phone] Acute Rehab Office [phone]

## 2020-06-14 NOTE — Progress Notes (Signed)
OT Cancellation Note  Patient Details Name: THOMOS DOMINE MRN: [id number] DOB: 24-Mar-1945   Cancelled Treatment:    Reason Eval/Treat Not Completed: Medical issues which prohibited therapy;Patient not medically ready discussed case with RN/PT- patient remains very medically unstable and still not appropriate for therapy attempts. Will f/u on next date of service.   Layla Maw 06/14/2020, 11:46 AM

## 2020-06-14 NOTE — Progress Notes (Signed)
Pt was unable to tolerate taking both tablets this morning.  When taking his second tablet, the patient was unable to tolerate taking his NRB off and sitting up.  Pt only received half his dose.  MD on call aware

## 2020-06-14 NOTE — Progress Notes (Signed)
[facility] for Infectious Disease    Date of Admission:  2020/06/29   Total days of antibiotics 3/day 1 anidula   ID: Tyrone Roman is a 75 y.o. male , immunocompromised host, RA with high dose steroids with prolonged acute covid illness Principal Problem:   Acute respiratory failure with hypoxia (HCC) Active Problems:   Hyperlipidemia   COPD (chronic obstructive pulmonary disease) ([place])   Essential hypertension   Post-COVID-19 syndrome manifesting as chronic cough   Pulmonary embolus (HCC)   SIRS (systemic inflammatory response syndrome) (HCC)   Thrush   Goals of care, counseling/discussion    Subjective: Afebrile. Still not significant change in symptom relief. Denies pain but still sob, increase work of breathing. Family at bedside  Medications:  . vitamin C  500 mg Oral Daily  . bebtelovimab EUA  175 mg Intravenous Once  . Chlorhexidine Gluconate Cloth  6 each Topical Daily  . docusate sodium  100 mg Oral BID  . feeding supplement  237 mL Oral TID BM  . hydrocortisone sod succinate (SOLU-CORTEF) inj  100 mg Intravenous Q8H  . insulin aspart  0-9 Units Subcutaneous Q4H  . insulin glargine  15 Units Subcutaneous QHS  . metoprolol tartrate  50 mg Oral BID  . midodrine  5 mg Oral TID WC  . mometasone-formoterol  2 puff Inhalation BID  . multivitamin with minerals  1 tablet Oral Daily  . pantoprazole  40 mg Oral Daily  . Rivaroxaban  15 mg Oral BID WC  . [START ON 06/16/2020] rivaroxaban  20 mg Oral Q supper  . simvastatin  20 mg Oral Daily  . sodium chloride flush  3 mL Intravenous Q12H  . sodium chloride flush  3 mL Intravenous Q12H  . sulfamethoxazole-trimethoprim  1 tablet Oral Once per day on Mon Wed Fri  . tamsulosin  0.4 mg Oral Daily  . umeclidinium bromide  1 puff Inhalation Daily  . zinc sulfate  220 mg Oral Daily    Objective: Vital signs in last 24 hours: Temp:  [97.6 F (36.4 C)-99.2 F (37.3 C)] 98.7 F (37.1 C) (05/10 0728) Pulse Rate:   [78-114] 81 (05/10 0854) Resp:  [18-26] 20 (05/10 0854) BP: (86-120)/(60-95) 98/79 (05/10 0854) SpO2:  [88 %-98 %] 91 % (05/10 0854) FiO2 (%):  [100 %] 100 % (05/10 0854) Physical Exam  Constitutional: He is oriented to person, place, and time. He appears well-developed and well-nourished. No distress.  HENT:  Mouth/Throat: Oropharynx is clear and moist. No oropharyngeal exudate.  Cardiovascular: Normal rate, regular rhythm and normal heart sounds. Exam reveals no gallop and no friction rub.  No murmur heard.  Pulmonary/Chest: Effort normal and breath sounds normal. Acc muscle use. Abdominal: Soft. Bowel sounds are normal. He exhibits no distension. There is no tenderness.  Lymphadenopathy:  He has no cervical adenopathy.  Neurological: He is alert and oriented to person, place, and time.  Skin: Skin is warm and dry. No rash noted. No erythema.  Psychiatric: He has a normal mood and affect. His behavior is normal.     Lab Results Recent Labs    06/13/20 0118 06/14/20 0202  WBC 7.3 4.8  HGB 12.7* 11.5*  HCT 39.8 34.6*  NA 129* 132*  K 3.9 2.8*  CL 97* 97*  CO2 25 22  BUN 29* 37*  CREATININE 0.92 1.07   Liver Panel Recent Labs    06/13/20 0118 06/14/20 0202  PROT 5.0* 4.9*  ALBUMIN 2.1* 1.8*  AST  32 36  ALT 40 33  ALKPHOS 75 74  BILITOT 0.5 0.8   Sedimentation Rate No results for input(s): ESRSEDRATE in the last 72 hours. C-Reactive Protein Recent Labs    06/13/20 0118 06/14/20 0202  CRP 8.6* 24.7*    Microbiology: reviewed Studies/Results: DG CHEST PORT 1 VIEW  Result Date: 06/13/2020 CLINICAL DATA:  Pneumonia due to COVID-19 virus.  Fever. EXAM: PORTABLE CHEST 1 VIEW COMPARISON:  1 day prior FINDINGS: Midline trachea. Normal heart size for level of inspiration. No pleural effusion or pneumothorax. Worsened, right greater than left interstitial opacities. Relatively diffuse, with more normal aerated left upper lung. IMPRESSION: Worsened COVID-19 pneumonia.  Electronically Signed   By: Abigail Miyamoto M.D.   On: 06/13/2020 08:17     Assessment/Plan: covid -pneumonia = plan on continuing remdesivir. Discussed with hospitalist and PCCM, maybe worth doing additional dose of mAb. He is not improving and concern that if he continues to worsen then may need intubation, which he does not want. Family informed of seriousness of his presentation, appears worse than prior.  [facility] Cell: [phone] Pager: [phone]  06/14/2020, 11:57 AM

## 2020-06-15 DIAGNOSIS — J439 Emphysema, unspecified: Secondary | ICD-10-CM | POA: Diagnosis not present

## 2020-06-15 DIAGNOSIS — I10 Essential (primary) hypertension: Secondary | ICD-10-CM | POA: Diagnosis not present

## 2020-06-15 DIAGNOSIS — U071 COVID-19: Secondary | ICD-10-CM | POA: Diagnosis not present

## 2020-06-15 DIAGNOSIS — J9601 Acute respiratory failure with hypoxia: Secondary | ICD-10-CM | POA: Diagnosis not present

## 2020-06-15 LAB — COMPREHENSIVE METABOLIC PANEL
ALT: 37 U/L (ref 0–44)
AST: 38 U/L (ref 15–41)
Albumin: 1.7 g/dL — ABNORMAL LOW (ref 3.5–5.0)
Alkaline Phosphatase: 70 U/L (ref 38–126)
Anion gap: 10 (ref 5–15)
BUN: 37 mg/dL — ABNORMAL HIGH (ref 8–23)
CO2: 22 mmol/L (ref 22–32)
Calcium: 9.2 mg/dL (ref 8.9–10.3)
Chloride: 102 mmol/L (ref 98–111)
Creatinine, Ser: 0.87 mg/dL (ref 0.61–1.24)
GFR, Estimated: >60 mL/min (ref >60)
Glucose, Bld: 280 mg/dL — ABNORMAL HIGH (ref 70–99)
Potassium: 3.9 mmol/L (ref 3.5–5.1)
Sodium: 134 mmol/L — ABNORMAL LOW (ref 135–145)
Total Bilirubin: 0.7 mg/dL (ref 0.3–1.2)
Total Protein: 4.6 g/dL — ABNORMAL LOW (ref 6.5–8.1)

## 2020-06-15 LAB — MISC LABCORP TEST (SEND OUT)
Labcorp test code: 164090
Labcorp test code: 164090

## 2020-06-15 LAB — CBC WITH DIFFERENTIAL/PLATELET
Abs Immature Granulocytes: 0.03 10*3/uL (ref 0.00–0.07)
Basophils Absolute: 0 10*3/uL (ref 0.0–0.1)
Basophils Relative: 0 %
Eosinophils Absolute: 0 10*3/uL (ref 0.0–0.5)
Eosinophils Relative: 0 %
HCT: 34.5 % — ABNORMAL LOW (ref 39.0–52.0)
Hemoglobin: 11.4 g/dL — ABNORMAL LOW (ref 13.0–17.0)
Immature Granulocytes: 1 %
Lymphocytes Relative: 2 %
Lymphs Abs: 0.1 10*3/uL — ABNORMAL LOW (ref 0.7–4.0)
MCH: 29.2 pg (ref 26.0–34.0)
MCHC: 33 g/dL (ref 30.0–36.0)
MCV: 88.2 fL (ref 80.0–100.0)
Monocytes Absolute: 0.1 10*3/uL (ref 0.1–1.0)
Monocytes Relative: 2 %
Neutro Abs: 5 10*3/uL (ref 1.7–7.7)
Neutrophils Relative %: 95 %
Platelets: UNDETERMINED 10*3/uL (ref 150–400)
RBC: 3.91 MIL/uL — ABNORMAL LOW (ref 4.22–5.81)
RDW: 19.3 % — ABNORMAL HIGH (ref 11.5–15.5)
WBC: 5.2 10*3/uL (ref 4.0–10.5)
nRBC: 0 % (ref 0.0–0.2)

## 2020-06-15 LAB — PHOSPHORUS: Phosphorus: 1.8 mg/dL — ABNORMAL LOW (ref 2.5–4.6)

## 2020-06-15 LAB — MAGNESIUM: Magnesium: 1.8 mg/dL (ref 1.7–2.4)

## 2020-06-15 LAB — GLUCOSE, CAPILLARY
Glucose-Capillary: 185 mg/dL — ABNORMAL HIGH (ref 70–99)
Glucose-Capillary: 189 mg/dL — ABNORMAL HIGH (ref 70–99)
Glucose-Capillary: 207 mg/dL — ABNORMAL HIGH (ref 70–99)
Glucose-Capillary: 274 mg/dL — ABNORMAL HIGH (ref 70–99)
Glucose-Capillary: 280 mg/dL — ABNORMAL HIGH (ref 70–99)
Glucose-Capillary: 422 mg/dL — ABNORMAL HIGH (ref 70–99)
Glucose-Capillary: 431 mg/dL — ABNORMAL HIGH (ref 70–99)

## 2020-06-15 LAB — FERRITIN: Ferritin: 2283 ng/mL — ABNORMAL HIGH (ref 24–336)

## 2020-06-15 LAB — D-DIMER, QUANTITATIVE: D-Dimer, Quant: 1.32 ug/mL-FEU — ABNORMAL HIGH (ref 0.00–0.50)

## 2020-06-15 LAB — C-REACTIVE PROTEIN: CRP: 23.9 mg/dL — ABNORMAL HIGH (ref <1.0)

## 2020-06-15 NOTE — Progress Notes (Signed)
OT Cancellation Note  Patient Details Name: MAZE CORNIEL MRN: [id number] DOB: 04/14/1945   Cancelled Treatment:    Reason Eval/Treat Not Completed: Medical issues which prohibited therapy;Patient not medically ready Discussed case with RN. Pt still very medically unstable. OT will sign off for now due to ongoing poor medical status/prognosis- however, if he improves medically, please reorder OT consult if/when appropriate.   Layla Maw 06/15/2020, 8:31 AM

## 2020-06-15 NOTE — Progress Notes (Signed)
   06/15/20 0430  Clinical Encounter Type  Visited With Patient  Visit Type Patient actively dying  Referral From Nurse  Consult/Referral To Chaplain  Chaplain responded to nurse, Gerald Stabs stating the patient is requesting a chaplain. The patient in bed stated he is dying. This chaplain provided presence by actively listening, gave words of encouragement and read Psalms 23. Chaplain remains available. This note was prepared by Jeanine Luz, M.Div..  For questions please contact by phone [phone].

## 2020-06-15 NOTE — Progress Notes (Signed)
PROGRESS NOTE                                                                                                                                                                                                             Patient Demographics:    Tyrone Roman, is a 75 y.o. male, DOB - 1945-09-18, MVH:[id number]  Outpatient Primary MD for the patient is [name], MD   Admit date - 06/24/2020   LOS - 3  Chief Complaint  Patient presents with  . Shortness of Breath       Brief Narrative: Patient is a 75 y.o. male with PMHx of RA, COPD, CAD s/p PCI, HTN, HLD-who had COVID-19 infection in January 2021-since then he has had numerous hospitalizations for recurrent pneumonia/fever-ultimately felt to be due to persistent/acute COVID-19 infection in a setting of severe immunocompromised state (s/p Rituxan infusion-COVID IgG antibody negative inspite of vaccination/natural infection).  His most recent hospitalization was from 4/20-4/28-during this hospitalization-he presented with worsening respiratory failure-and responded to IV steroids and monoclonal antibody infusion-he was discharged home on his usual regimen of 2-3 L.  He again presented to the hospital on 5/8 with worsening hypoxemia-failed to be due to ongoing COVID-19 pneumonitis-given monoclonal antibody, high titer convalescent plasma, IV steroids, IV remdesivir without any significant clinical improvement.  He remains severely hypoxemic on heated high flow and NRB.  COVID-19 vaccinated status: Vaccinated including booster  Significant Events: 5/8>> Admit to [facility] for acute on chronic hypoxia-due to COVID-19 pneumonitis.  Significant studies: 5/8>> CTA chest: Residual PE in right lower lobe-diffuse pulmonary infiltrates.  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  He immunocompromised-gets Rituxan infusion-and unfortunately has not made COVID-19 antibodies  in spite of being vaccinated and getting COVID-19 infection earlier this year.  He has had extensive work-up and has prior hospitalizations-no other cause of hypoxia/infection has been identified.  He unfortunately continues to slowly deteriorate-he is severely hypoxic-his prognosis is very poor-suspect that if he does not improve in the next day or so-reasonable to consider transitioning to hospice care.  Have had extensive discussion with patient-and his daughter at bedside this morning-we will let family discuss further-and touch base on 5/12.   O2 requirements:  SpO2: 91 % O2 Flow Rate (L/min): 40 L/min FiO2 (%): 100 %   COVID-19 Labs: Recent Labs    06/13/20 0118 06/14/20 0202 06/15/20 0126  DDIMER 0.58* 1.48* 1.32*  FERRITIN 1,058* 2,194* 2,283*  CRP 8.6* 24.7* 23.9*       Component Value Date/Time   BNP 294.8 (H) 06/01/2020 0250    Recent Labs  Lab 06/27/2020 1014 06/14/20 0202  PROCALCITON <0.10 0.77    Lab Results  Component Value Date   SARSCOV2NAA POSITIVE (A) 06/20/2020   SARSCOV2NAA POSITIVE (A) 05/25/2020   SARSCOV2NAA POSITIVE (A) 05/09/2020   SARSCOV2NAA POSITIVE (A) 03/25/2020     Prone/Incentive Spirometry: encouraged patient to lie prone for 3-4 hours  at a time for a total of 16 hours a day, and to encourage incentive spirometry use 3-4/hour.  Pulmonary embolism: Continue anticoagulation  Oral thrush: Prior sputum culture positive for Candida glabrata-he is currently being covered with Eraxis.  CAD: No anginal symptoms-on aspirin  Rheumatoid arthritis: On Rituxan previously-symptoms stable on steroids.  Has been on steroids since earlier this year-if he survives this hospitalization-will need to be tapered back to his usual oral prednisone regimen of 30 mg p.o. twice daily.  COPD: Stable-not in exacerbation-continue bronchodilators  BPH: Continue Flomax  HTN: Stable-continue Lopressor  HLD: Continue Zocor  DM-2: CBGs relatively stable on Lantus 15 units daily at bedtime and SSI.  Follow and adjust.  Recent Labs    06/15/20 0422 06/15/20 0734 06/15/20 1215  GLUCAP 207* 189* 185*    Nutrition Problem: Nutrition Problem: Increased nutrient needs Etiology: acute illness (COVID-19) Signs/Symptoms: estimated needs Interventions: Ensure Enlive (each supplement provides 350kcal and 20 grams of protein),MVI,Magic cup  Obesity: Estimated body mass index is 24.91 kg/m as calculated from the following:   Height as of 05/25/20: 6\' 2"  (1.88 m).   Weight as of 05/25/20: 88 kg.   RN pressure injury documentation: Pressure Injury 04/03/20 Sacrum Medial blanchable redness on sacrum (Active)  04/03/20 0341  Location: Sacrum  Location Orientation: Medial  Staging:   Wound Description (Comments): blanchable redness on sacrum  Present on Admission: Yes    GI prophylaxis: PPI  ABG:    Component Value Date/Time   PHART 7.480 (H) 06/13/2020 0605   PCO2ART 29.2 (L) 06/13/2020 0605   PO2ART 58.8 (L) 06/13/2020 0605   HCO3 21.1 06/13/2020 0605   TCO2 21 (L) 07/02/2020 0643   ACIDBASEDEF 1.6 06/13/2020 0605   O2SAT 87.2 06/13/2020 0605    Vent Settings: N/A FiO2 (%):  [100 %] 100 %  Condition - Extremely Guarded-very tenuous  with risk for further deterioration  Family Communication  : Daughter at bedside  Code Status :  DNR  Diet :  Diet Order            Diet Carb Modified Fluid consistency: Thin; Room service appropriate? Yes  Diet effective now                  Disposition Plan  :  Status is: Inpatient  Remains inpatient appropriate because:Inpatient level of care appropriate due to severity of illness   Dispo:  Patient From: Home  Planned Disposition: Home  Medically stable for discharge: No     Barriers to discharge: Hypoxia requiring O2 supplementation/complete 5 days of IV Remdesivir  Antimicorbials  :    Anti-infectives (From admission, onward)   Start     Dose/Rate Route Frequency Ordered Stop   06/15/20 0830  anidulafungin (ERAXIS) 100 mg in sodium chloride 0.9 % 100 mL IVPB        100 mg 78 mL/hr over 100 Minutes Intravenous Every 24 hours 06/14/20 0738     06/14/20 1000  remdesivir 100 mg in sodium chloride 0.9 % 100 mL IVPB       "Followed by" Linked Group Details   100 mg 200 mL/hr over 30 Minutes Intravenous Daily 06/13/20 1058 2020-07-03 0959   06/14/20 0830  anidulafungin (ERAXIS) 200 mg in sodium chloride 0.9 % 200 mL IVPB        200 mg 78 mL/hr over 200 Minutes Intravenous  Once 06/14/20 0738 06/14/20 1520   06/13/20 1145  remdesivir 200 mg in sodium chloride 0.9% 250 mL IVPB       "Followed by" Linked Group Details   200 mg 580 mL/hr over 30 Minutes Intravenous Once 06/13/20 1058 06/15/20 0708   06/13/20 0900  sulfamethoxazole-trimethoprim (BACTRIM DS) 800-160 MG per tablet 1 tablet        1 tablet Oral Once per day on Mon Wed Fri 06/25/2020 0946     06/13/2020 1045  fluconazole (DIFLUCAN) IVPB 200 mg  Status:  Discontinued        200 mg 100 mL/hr over 60 Minutes Intravenous Every 24 hours 06/21/2020 0946 06/14/20 0738      Inpatient Medications  Scheduled Meds: . vitamin C  500 mg Oral Daily  . Chlorhexidine Gluconate Cloth  6 each Topical Daily  . docusate sodium   100 mg Oral BID  . feeding supplement  237 mL Oral TID BM  . insulin aspart  0-9 Units Subcutaneous Q4H  . insulin glargine  15 Units Subcutaneous QHS  . methylPREDNISolone (SOLU-MEDROL) injection  60 mg Intravenous Q8H  . metoprolol tartrate  50 mg Oral BID  . midodrine  5 mg Oral TID WC  . mometasone-formoterol  2 puff Inhalation BID  . multivitamin with minerals  1 tablet Oral Daily  . pantoprazole  40 mg Oral Daily  . Rivaroxaban  15 mg Oral BID WC  . [START ON 06/16/2020] rivaroxaban  20 mg Oral Q supper  . simvastatin  20 mg Oral Daily  . sodium chloride flush  3 mL Intravenous Q12H  . sodium chloride flush  3 mL Intravenous Q12H  . sulfamethoxazole-trimethoprim  1 tablet Oral Once per day on Mon Wed Fri  . tamsulosin  0.4 mg Oral Daily  . umeclidinium bromide  1 puff Inhalation Daily  . zinc sulfate  220 mg Oral Daily   Continuous Infusions: . sodium chloride    . sodium chloride    . anidulafungin 100 mg (06/15/20 1231)  . famotidine (PEPCID) IV    . remdesivir 100 mg in NS 100 mL 100 mg (06/15/20 0944)   PRN Meds:.sodium chloride, sodium chloride, acetaminophen, albuterol, albuterol, bisacodyl, chlorpheniramine-HYDROcodone, diphenhydrAMINE, EPINEPHrine, famotidine (PEPCID) IV, guaiFENesin-dextromethorphan, ipratropium-albuterol, melatonin, menthol-cetylpyridinium, methylPREDNISolone (SOLU-MEDROL) injection, ondansetron **OR** ondansetron (ZOFRAN) IV, oxyCODONE, polyethylene glycol, sodium chloride flush, sodium phosphate   Time Spent in minutes  45  See all Orders from today for further details   Jeoffrey Massed M.D on 06/15/2020 at 2:38 PM  To page go to [email or web address] - use universal password  [facility] -  Office  [phone]    Objective:   Vitals:   06/15/20 0732 06/15/20 0849 06/15/20 1218 06/15/20 1330  BP: (!) 147/93  (!) 122/94   Pulse: 79  85 77  Resp: 20  20 (!) 24  Temp: (!) 96.6 F (35.9 C)  (!) 97.1 F (36.2 C)   TempSrc: Rectal   Rectal   SpO2: 90% 90% 92% 91%    Wt Readings from Last 3 Encounters:  05/25/20 88 kg  05/09/20 88 kg  05/05/20 88.5 kg     Intake/Output Summary (Last 24 hours) at 06/15/2020 1438 Last data filed at 06/14/2020 2300 Gross per 24 hour  Intake 100 ml  Output 800 ml  Net -700 ml     Physical Exam Gen Exam:Alert awake-not in any distress HEENT:atraumatic, normocephalic Chest: B/L clear to auscultation anteriorly CVS:S1S2 regular Abdomen:soft non tender, non distended Extremities:no edema Neurology: Non focal Skin: no rash   Data Review:    CBC Recent Labs  Lab 06/08/2020 0320 07/03/2020 0643 06/13/20 0118 06/14/20 0202 06/15/20 0126  WBC 9.6  --  7.3 4.8 5.2  HGB 14.9 11.9* 12.7* 11.5* 11.4*  HCT 47.3 35.0* 39.8 34.6* 34.5*  PLT PLATELET CLUMPS NOTED ON SMEAR, UNABLE TO ESTIMATE  --  74* PLATELET CLUMPS NOTED ON SMEAR, UNABLE TO ESTIMATE PLATELET CLUMPS NOTED ON SMEAR, UNABLE TO ESTIMATE  MCV 91.1  --  89.6 87.4 88.2  MCH 28.7  --  28.6 29.0 29.2  MCHC 31.5  --  31.9 33.2 33.0  RDW 20.1*  --  19.9* 19.0* 19.3*  LYMPHSABS 0.3*  --  0.1* 0.1* 0.1*  MONOABS 0.2  --  0.1 0.0* 0.1  EOSABS 0.0  --  0.0 0.0 0.0  BASOSABS 0.0  --  0.0 0.0 0.0    Chemistries  Recent Labs  Lab 06/25/2020 0320 07/05/2020 0643 06/13/20 0118 06/14/20 0202 06/15/20 0126  NA 132* 132* 129* 132* 134*  K 4.6 4.3 3.9 2.8* 3.9  CL 101  --  97* 97* 102  CO2 23  --  25 22 22   GLUCOSE 233*  --  210* 310* 280*  BUN 32*  --  29* 37* 37*  CREATININE 0.98  --  0.92 1.07 0.87  CALCIUM 9.6  --  9.1 8.9 9.2  MG  --   --  1.8 1.8 1.8  AST 53*  --  32 36 38  ALT 61*  --  40 33 37  ALKPHOS 99  --  75 74 70  BILITOT 0.3  --  0.5 0.8 0.7   ------------------------------------------------------------------------------------------------------------------ No results for input(s): CHOL, HDL, LDLCALC, TRIG, CHOLHDL, LDLDIRECT in the last 72 hours.  Lab Results  Component Value Date   HGBA1C 6.3 (H)  05/12/2020   ------------------------------------------------------------------------------------------------------------------ No results for input(s): TSH, T4TOTAL, T3FREE, THYROIDAB in the last 72 hours.  Invalid input(s): FREET3 ------------------------------------------------------------------------------------------------------------------ Recent Labs    06/14/20 0202 06/15/20 0126  FERRITIN 2,194* 2,283*    Coagulation profile Recent Labs  Lab 06/05/2020 0320  INR 1.7*    Recent Labs    06/14/20 0202 06/15/20 0126  DDIMER 1.48* 1.32*    Cardiac Enzymes No results for input(s): CKMB, TROPONINI, MYOGLOBIN in the last 168 hours.  Invalid input(s): CK ------------------------------------------------------------------------------------------------------------------    Component Value Date/Time   BNP 294.8 (  H) 06/01/2020 0250    Micro Results Recent Results (from the past 240 hour(s))  Resp Panel by RT-PCR (Flu A&B, Covid) Nasopharyngeal Swab     Status: Abnormal   Collection Time: 06/17/2020  3:10 AM   Specimen: Nasopharyngeal Swab; Nasopharyngeal(NP) swabs in vial transport medium  Result Value Ref Range Status   SARS Coronavirus 2 by RT PCR POSITIVE (A) NEGATIVE Final    Comment: RESULT CALLED TO, READ BACK BY AND VERIFIED WITH: RN J VASQUEZ AT [id number] 07/02/2020 BY L BENFIELD (NOTE) SARS-CoV-2 target nucleic acids are DETECTED.  The SARS-CoV-2 RNA is generally detectable in upper respiratory specimens during the acute phase of infection. Positive results are indicative of the presence of the identified virus, but do not rule out bacterial infection or co-infection with other pathogens not detected by the test. Clinical correlation with patient history and other diagnostic information is necessary to determine patient infection status. The expected result is Negative.  Fact Sheet for Patients: EntrepreneurPulse.com.au  Fact Sheet for Healthcare  Providers: IncredibleEmployment.be  This test is not yet approved or cleared by the [place] FDA and  has been authorized for detection and/or diagnosis of SARS-CoV-2 by FDA under an Emergency Use Authorization (EUA).  This EUA will remain in effect (meaning this t est can be used) for the duration of  the COVID-19 declaration under Section 564(b)(1) of the Act, 21 U.S.C. section 360bbb-3(b)(1), unless the authorization is terminated or revoked sooner.     Influenza A by PCR NEGATIVE NEGATIVE Final   Influenza B by PCR NEGATIVE NEGATIVE Final    Comment: (NOTE) The Xpert Xpress SARS-CoV-2/FLU/RSV plus assay is intended as an aid in the diagnosis of influenza from Nasopharyngeal swab specimens and should not be used as a sole basis for treatment. Nasal washings and aspirates are unacceptable for Xpert Xpress SARS-CoV-2/FLU/RSV testing.  Fact Sheet for Patients: EntrepreneurPulse.com.au  Fact Sheet for Healthcare Providers: IncredibleEmployment.be  This test is not yet approved or cleared by the [place] FDA and has been authorized for detection and/or diagnosis of SARS-CoV-2 by FDA under an Emergency Use Authorization (EUA). This EUA will remain in effect (meaning this test can be used) for the duration of the COVID-19 declaration under Section 564(b)(1) of the Act, 21 U.S.C. section 360bbb-3(b)(1), unless the authorization is terminated or revoked.  Performed at [facility], [place] [address] [place], [place] [address]   Urine culture     Status: Abnormal   Collection Time: 07/02/2020  3:37 AM   Specimen: Urine, Clean Catch  Result Value Ref Range Status   Specimen Description URINE, CLEAN CATCH  Final   Special Requests   Final    NONE Performed at [facility], [place] [address] [place], [place] [address]    Culture (A)  Final    80,000 COLONIES/mL STAPHYLOCOCCUS EPIDERMIDIS 50,000 COLONIES/mL  ENTEROCOCCUS FAECALIS    Report Status 06/14/2020 FINAL  Final   Organism ID, Bacteria STAPHYLOCOCCUS EPIDERMIDIS (A)  Final   Organism ID, Bacteria ENTEROCOCCUS FAECALIS (A)  Final      Susceptibility   Enterococcus faecalis - MIC*    AMPICILLIN <=2 SENSITIVE Sensitive     NITROFURANTOIN <=16 SENSITIVE Sensitive     VANCOMYCIN 1 SENSITIVE Sensitive     * 50,000 COLONIES/mL ENTEROCOCCUS FAECALIS   Staphylococcus epidermidis - MIC*    CIPROFLOXACIN >=8 RESISTANT Resistant     GENTAMICIN <=0.5 SENSITIVE Sensitive     NITROFURANTOIN <=16 SENSITIVE Sensitive  OXACILLIN >=4 RESISTANT Resistant     TETRACYCLINE >=16 RESISTANT Resistant     VANCOMYCIN 1 SENSITIVE Sensitive     TRIMETH/SULFA 80 RESISTANT Resistant     CLINDAMYCIN <=0.25 SENSITIVE Sensitive     RIFAMPIN <=0.5 SENSITIVE Sensitive     Inducible Clindamycin NEGATIVE Sensitive     * 80,000 COLONIES/mL STAPHYLOCOCCUS EPIDERMIDIS  Blood culture (routine single)     Status: None (Preliminary result)   Collection Time: 06/09/2020  3:40 AM   Specimen: BLOOD RIGHT ARM  Result Value Ref Range Status   Specimen Description BLOOD RIGHT ARM  Final   Special Requests   Final    BOTTLES DRAWN AEROBIC AND ANAEROBIC Blood Culture results may not be optimal due to an inadequate volume of blood received in culture bottles   Culture   Final    NO GROWTH 3 DAYS Performed at [facility], [place] [address] [place], [place] [address]    Report Status PENDING  Incomplete  Respiratory (~20 pathogens) panel by PCR     Status: None   Collection Time: 06/13/20  4:09 PM   Specimen: Nasopharyngeal Swab; Respiratory  Result Value Ref Range Status   Adenovirus NOT DETECTED NOT DETECTED Final   Coronavirus 229E NOT DETECTED NOT DETECTED Final    Comment: (NOTE) The Coronavirus on the Respiratory Panel, DOES NOT test for the novel  Coronavirus (2019 nCoV)    Coronavirus HKU1 NOT DETECTED NOT DETECTED Final   Coronavirus NL63 NOT DETECTED NOT  DETECTED Final   Coronavirus OC43 NOT DETECTED NOT DETECTED Final   Metapneumovirus NOT DETECTED NOT DETECTED Final   Rhinovirus / Enterovirus NOT DETECTED NOT DETECTED Final   Influenza A NOT DETECTED NOT DETECTED Final   Influenza B NOT DETECTED NOT DETECTED Final   Parainfluenza Virus 1 NOT DETECTED NOT DETECTED Final   Parainfluenza Virus 2 NOT DETECTED NOT DETECTED Final   Parainfluenza Virus 3 NOT DETECTED NOT DETECTED Final   Parainfluenza Virus 4 NOT DETECTED NOT DETECTED Final   Respiratory Syncytial Virus NOT DETECTED NOT DETECTED Final   Bordetella pertussis NOT DETECTED NOT DETECTED Final   Bordetella Parapertussis NOT DETECTED NOT DETECTED Final   Chlamydophila pneumoniae NOT DETECTED NOT DETECTED Final   Mycoplasma pneumoniae NOT DETECTED NOT DETECTED Final    Comment: Performed at [facility], [place]. [address], [place], [place] [address]    Radiology Reports CT ANGIO CHEST PE W OR WO CONTRAST  Result Date: 06/19/2020 CLINICAL DATA:  I more foci of EXAM: CT ANGIOGRAPHY CHEST WITH CONTRAST TECHNIQUE: Multidetector CT imaging of the chest was performed using the standard protocol during bolus administration of intravenous contrast. Multiplanar CT image reconstructions and MIPs were obtained to evaluate the vascular anatomy. CONTRAST:  52mL OMNIPAQUE IOHEXOL 350 MG/ML SOLN COMPARISON:  CT angiography 05/14/2020, CT angiography 03/26/2020, CT angiography 11/27/2010 FINDINGS: Cardiovascular: Satisfactory opacification of the pulmonary arteries to the segmental level. Filling defect in the right lower lobe segmental pulmonary artery (5:52). No central pulmonary embolus. No definite left pulmonary embolus. Left ventricular hypertrophy. No pericardial effusion. Four-vessel coronary artery calcification. Mediastinum/Nodes: No enlarged mediastinal, hilar, or axillary lymph nodes. Thyroid gland, trachea, and esophagus demonstrate no significant findings. Lungs/Pleura: Redemonstration of  cystic changes that are more apparent within the upper lobes likely related to emphysematous changes. Interlobular septal wall thickening. Redemonstration of peribronchovascular ground-glass airspace opacities and consolidation with associated bronchiectasis. No pleural effusion. No pneumothorax. Upper Abdomen: No acute abnormality. Musculoskeletal: No abdominal wall hernia or  abnormality. No suspicious lytic or blastic osseous lesions. No acute displaced fracture. Multilevel degenerative changes of the spine. Review of the MIP images confirms the above findings. IMPRESSION: 1. Residual segmental right lower lobe pulmonary embolus with interval resolution of the central pulmonary embolus component. No associated heart strain. No definite pulmonary infarction. 2. Diffuse pulmonary findings similar to at least as far back as 03/16/2020. Findings could be infectious/inflammatory. Underlying malignancy not excluded. 3. Left ventricular hypertrophy. 4. Aortic Atherosclerosis (ICD10-I70.0) and Emphysema (ICD10-J43.9). 5. Four-vessel coronary artery calcifications. These results were called by telephone at the time of interpretation on 06/15/2020 at 5:36 am to provider [facility] , who verbally acknowledged these results. Electronically Signed   By: Iven Finn M.D.   On: 06/06/2020 05:41   CT Angio Chest PE W and/or Wo Contrast  Result Date: 05/25/2020 CLINICAL DATA:  75 year old male with shortness of breath and chest pain. Concern for pulmonary embolism. EXAM: CT ANGIOGRAPHY CHEST WITH CONTRAST TECHNIQUE: Multidetector CT imaging of the chest was performed using the standard protocol during bolus administration of intravenous contrast. Multiplanar CT image reconstructions and MIPs were obtained to evaluate the vascular anatomy. CONTRAST:  87mL OMNIPAQUE IOHEXOL 350 MG/ML SOLN COMPARISON:  Chest CT dated 05/14/2020. FINDINGS: Cardiovascular: Mild cardiomegaly. No pericardial effusion. There is coronary  vascular calcification. Mild atherosclerotic calcification of the thoracic aorta. Linear nonocclusive thrombus noted extending from the central right pulmonary artery into the lobar and segmental branches of the right lower lobe and right upper lobe. This is new since the prior CT. No CT evidence of right heart straining. Mediastinum/Nodes: Top-normal right hilar lymph nodes. The esophagus and the thyroid gland are grossly unremarkable. No mediastinal fluid collection. Lungs/Pleura: Bilateral patchy and streaky densities similar to prior CT. Calcified pleural plaque noted in the left upper lobe anteriorly. No pleural effusion pneumothorax. The central airways are patent. Upper Abdomen: No acute abnormality. Musculoskeletal: No chest wall abnormality. No acute or significant osseous findings. Review of the MIP images confirms the above findings. IMPRESSION: 1. Pulmonary artery embolus extending from the central right pulmonary artery into the lobar and segmental branches of the right lower lobe and right upper lobe, new since the prior CT. No CT evidence of right heart straining. 2. Bilateral patchy and streaky densities similar to prior CT. 3. Aortic Atherosclerosis (ICD10-I70.0). These results were called by telephone at the time of interpretation on 05/25/2020 at 6:39 pm to provider DAVID YAO , who verbally acknowledged these results. Electronically Signed   By: Anner Crete M.D.   On: 05/25/2020 18:43   MR BRAIN W WO CONTRAST  Result Date: 05/16/2020 CLINICAL DATA:  Delirium; fever with unknown origin, altered mental status, evaluate for encephalitis. EXAM: MRI HEAD WITHOUT AND WITH CONTRAST TECHNIQUE: Multiplanar, multiecho pulse sequences of the brain and surrounding structures were obtained without and with intravenous contrast. CONTRAST:  92mL GADAVIST GADOBUTROL 1 MMOL/ML IV SOLN COMPARISON:  Prior head CT examinations 04/02/2020 and earlier. Brain MRI 12/13/2017. FINDINGS: Brain: Mild cerebral and  cerebellar atrophy. AP punctate focus of restricted diffusion is questioned within the right parietal lobe (versus artifact) (series 3, image 33). Additionally, there is apparent subtle restricted diffusion along the midline callosal splenium, slightly eccentric to the left. A few small foci of T2/FLAIR hyperintensity within the bilateral cerebral white matter and right thalamus are nonspecific, but likely reflect minimal chronic small vessel ischemic disease. Redemonstrated left retrocerebellar arachnoid cyst. There is no acute infarct. No evidence of intracranial mass. No chronic intracranial blood products.  No extra-axial fluid collection. No midline shift. No abnormal intracranial enhancement. Vascular: Expected proximal arterial flow voids. Skull and upper cervical spine: No focal marrow lesion. Incompletely assessed cervical spondylosis. Suspected C4-C5 vertebral body ankylosis. Sinuses/Orbits: Visualized orbits show no acute finding. Bilateral lens replacements. Mild paranasal sinus mucosal thickening, most notably within the bilateral ethmoid air cells. Small left maxillary sinus mucous retention cyst. Other: Right mastoid effusion. IMPRESSION: Punctate acute infarct versus artifact within the right parietal lobe. Additionally, there is apparent subtle restricted diffusion within the midline callosal splenium, slightly eccentric to the left. This may reflect a small focus of acute ischemia or artifact. Alternatively, this may reflect a cytotoxic lesion of the corpus callosum (which has a broad range of potential etiologies and clinical associations), although the appearance would be atypical for this entity. Background mild parenchymal atrophy and chronic small vessel ischemic disease. Mild paranasal sinus disease as described. Right mastoid effusion. Electronically Signed   By: Kellie Simmering DO   On: 05/16/2020 18:51   MR CERVICAL SPINE W WO CONTRAST  Result Date: 05/17/2020 CLINICAL DATA:  Initial  screening for possible epidural abscess. EXAM: MRI CERVICAL SPINE WITHOUT AND WITH CONTRAST TECHNIQUE: Multiplanar and multiecho pulse sequences of the cervical spine, to include the craniocervical junction and cervicothoracic junction, were obtained without and with intravenous contrast. CONTRAST:  62mL GADAVIST GADOBUTROL 1 MMOL/ML IV SOLN COMPARISON:  Prior MRI from 03/24/2010. FINDINGS: Alignment: Reversal of the normal cervical lordosis with apex at C3-4. Trace anterolisthesis of C3 on C4 and C7 on T1, chronic and facet mediated. Vertebrae: Vertebral body height maintained without acute or chronic fracture. C4 and C5 vertebral bodies are largely ankylosed, likely degenerative. Bone marrow signal intensity within normal limits. No discrete or worrisome osseous lesions. No abnormal marrow edema or enhancement. No findings to suggest osteomyelitis discitis or septic arthritis. Cord: Normal signal and morphology. No epidural abscess or other collection. Posterior Fossa, vertebral arteries, paraspinal tissues: Probable benign retro cerebellar arachnoid cyst noted. Visualized brain and posterior fossa otherwise unremarkable. Craniocervical junction within normal limits. Paraspinous and prevertebral soft tissues normal. Normal flow voids seen within the vertebral arteries bilaterally. Disc levels: C2-C3: Negative interspace. Right worse than left facet hypertrophy. No spinal stenosis. Mild right C3 foraminal narrowing. Left neural foramina remains patent. C3-C4: Broad-based posterior disc osteophyte flattens and effaces the ventral thecal sac. Mild flattening of the ventral cord without cord signal changes. Mild spinal stenosis. Superimposed bilateral facet hypertrophy with left greater than right uncovertebral spurring. Severe left with moderate right C4 foraminal stenosis. C4-C5: C4 and C5 vertebral bodies are largely ankylosed. Broad posterior endplate osseous ridging flattens and effaces the ventral thecal sac.  Mild spinal stenosis without cord impingement. Moderate right worse than left C5 foraminal stenosis. C5-C6: Degenerative intervertebral disc space narrowing with diffuse disc osteophyte complex. Mild flattening of the ventral thecal sac without significant spinal stenosis. Moderate left worse than right C6 foraminal narrowing. C6-C7: Degenerative intervertebral disc space narrowing with diffuse disc osteophyte complex. No significant spinal stenosis. Severe left with moderate right C7 foraminal stenosis. C7-T1: Trace anterolisthesis. No significant disc bulge. Moderate bilateral facet and ligament flavum hypertrophy. No significant spinal stenosis. Foramina remain patent. IMPRESSION: 1. No evidence for osteomyelitis discitis or septic arthritis within the cervical spine. No epidural abscess or other infection. 2. Multilevel cervical spondylosis with resultant mild spinal stenosis at C3-4 and C4-5. 3. Multifactorial degenerative changes with resultant multilevel foraminal narrowing as above. Notable findings include severe left with moderate right C4 foraminal stenosis,  moderate bilateral C5 and C6 foraminal narrowing, with severe left and moderate right C7 foraminal stenosis. Electronically Signed   By: Jeannine Boga M.D.   On: 05/17/2020 21:40   MR THORACIC SPINE W WO CONTRAST  Result Date: 05/17/2020 CLINICAL DATA:  Initial screening for possible infection, epidural abscess. EXAM: MRI THORACIC WITHOUT AND WITH CONTRAST TECHNIQUE: Multiplanar and multiecho pulse sequences of the thoracic spine were obtained without and with intravenous contrast. CONTRAST:  57mL GADAVIST GADOBUTROL 1 MMOL/ML IV SOLN COMPARISON:  None available. FINDINGS: Alignment: Straightening of the normal thoracic kyphosis. No listhesis. Vertebrae: Vertebral body height maintained without acute or chronic fracture. Bone marrow signal intensity within normal limits. Few scattered benign hemangiomata noted. No worrisome osseous lesions.  No abnormal marrow edema or enhancement. No findings to suggest osteomyelitis discitis or septic arthritis. Cord: Normal signal and morphology. No epidural abscess or other collection. No abnormal enhancement. Paraspinal and other soft tissues: Paraspinous soft tissues demonstrate no acute finding. Small layering bilateral pleural effusions noted, left slightly larger than right. T2 hyperintense benign appearing cyst partially visualized extending from the right kidney. Visualized visceral structures otherwise grossly unremarkable. Disc levels: T1-2:  Unremarkable. T2-3: Tiny right paracentral disc extrusion with superior migration. Mild right-sided facet hypertrophy. No spinal stenosis. Foramina remain patent. T3-4: Minimal disc bulge. No spinal stenosis. Foramina remain patent. T4-5: Normal interspace. Mild right-sided facet hypertrophy. No stenosis. T5-6:  Normal interspace.  Mild facet hypertrophy.  No stenosis. T6-7:  Unremarkable. T7-8: Mild degenerative intervertebral disc space narrowing with disc bulge. No spinal stenosis. Foramina remain patent. T8-9: Mild disc bulge. No spinal stenosis. Foramina remain patent. T9-10: Mild disc bulge with posterior element hypertrophy. No significant spinal stenosis. Mild bilateral foraminal narrowing. T10-11: Diffuse disc bulge, asymmetric to the right. Moderate facet and ligament flavum hypertrophy. Resultant mild to moderate spinal stenosis with mild cord flattening. No cord signal changes. Moderate bilateral foraminal narrowing. T11-12: Negative interspace. Bilateral facet hypertrophy. No significant spinal stenosis. Mild bilateral foraminal narrowing. T12-L1: Disc desiccation with small endplate Schmorl's node deformities. No stenosis. IMPRESSION: 1. No MRI evidence for osteomyelitis discitis or septic arthritis within the thoracic spine. No epidural abscess or other infection. 2. Disc bulging with facet hypertrophy at T10-11 with resultant mild to moderate spinal  stenosis and moderate bilateral foraminal narrowing. 3. Additional mild spondylosis elsewhere within the thoracic spine without significant stenosis. 4. Small layering bilateral pleural effusions, left slightly larger than right. Electronically Signed   By: Jeannine Boga M.D.   On: 05/17/2020 21:52   DG CHEST PORT 1 VIEW  Result Date: 06/13/2020 CLINICAL DATA:  Pneumonia due to COVID-19 virus.  Fever. EXAM: PORTABLE CHEST 1 VIEW COMPARISON:  1 day prior FINDINGS: Midline trachea. Normal heart size for level of inspiration. No pleural effusion or pneumothorax. Worsened, right greater than left interstitial opacities. Relatively diffuse, with more normal aerated left upper lung. IMPRESSION: Worsened COVID-19 pneumonia. Electronically Signed   By: Abigail Miyamoto M.D.   On: 06/13/2020 08:17   DG Chest Port 1 View  Result Date: 06/21/2020 CLINICAL DATA:  Sepsis EXAM: PORTABLE CHEST 1 VIEW COMPARISON:  05/30/2020 FINDINGS: Bilateral interstitial opacities, unchanged. Normal cardiomediastinal contours. No pneumothorax or sizable pleural effusion. IMPRESSION: Unchanged bilateral interstitial opacities. Electronically Signed   By: Ulyses Jarred M.D.   On: 06/27/2020 03:48   DG Chest Port 1 View  Result Date: 05/30/2020 CLINICAL DATA:  Difficulty breathing, low O2 sats, history of COVID infection. EXAM: PORTABLE CHEST 1 VIEW COMPARISON:  05/29/2020 and CT  chest 05/25/2020. FINDINGS: Heart size stable. Patchy mixed interstitial and airspace opacification bilaterally, increased from 05/29/2020. No pleural fluid. IMPRESSION: Worsening COVID-19 pneumonia. Electronically Signed   By: Lorin Picket M.D.   On: 05/30/2020 09:11   DG Chest Port 1 View  Result Date: 05/29/2020 CLINICAL DATA:  Shortness of breath.  Covid-19 viral infection. EXAM: PORTABLE CHEST 1 VIEW COMPARISON:  05/28/2020 FINDINGS: Heart size remains within normal limits. Diffuse interstitial infiltrates are again seen with mild patchy airspace  opacities in the left lung base. These show no significant change since previous study. No evidence of pleural effusion. IMPRESSION: No significant change in diffuse interstitial infiltrates with mild patchy airspace disease in left lower lung. Electronically Signed   By: Marlaine Hind M.D.   On: 05/29/2020 08:36   DG Chest Port 1 View  Result Date: 05/28/2020 CLINICAL DATA:  Shortness of breath EXAM: PORTABLE CHEST 1 VIEW COMPARISON:  Three days ago FINDINGS: Patchy bilateral pulmonary infiltrate without convincing progression. Lung volumes are low. No visible effusion or air leak. Normal heart size and stable mediastinal contours. IMPRESSION: Similar degree of bilateral pulmonary infiltrates. No new abnormality. Electronically Signed   By: Monte Fantasia M.D.   On: 05/28/2020 07:38   DG Chest Port 1 View  Result Date: 05/25/2020 CLINICAL DATA:  75 year old male with concern for sepsis. EXAM: PORTABLE CHEST 1 VIEW COMPARISON:  Chest CT dated 05/14/2020. FINDINGS: Diffuse bilateral streaky interstitial densities worsened since 05/09/2020 but relatively similar to 05/10/2020 concerning for residual infiltrate, likely on the background of chronic changes. Clinical correlation and follow-up recommended. No lobar consolidation, pleural effusion, pneumothorax. The cardiac silhouette is within limits. No acute osseous pathology. IMPRESSION: Persistent bilateral infiltrates. Electronically Signed   By: Anner Crete M.D.   On: 05/25/2020 16:24   ECHOCARDIOGRAM COMPLETE  Result Date: 05/26/2020    ECHOCARDIOGRAM REPORT   Patient Name:   Tyrone Roman Date of Exam: 05/26/2020 Medical Rec #:  [id number]      Height:       74.0 in Accession #:    [id number]     Weight:       194.0 lb Date of Birth:  11/23/1945      BSA:          2.146 m Patient Age:    70 years       BP:           129/92 mmHg Patient Gender: M              HR:           84 bpm. Exam Location:  Inpatient Procedure: 2D Echo, Cardiac Doppler, Color  Doppler and Intracardiac            Opacification Agent Indications:    Pulmonary Embolus I26.09  History:        Patient has prior history of Echocardiogram examinations, most                 recent 04/03/2020. CAD and Previous Myocardial Infarction, COPD,                 Signs/Symptoms:Shortness of Breath; Risk Factors:Hypertension                 and Dyslipidemia. COVID 19. Sepsis, due to unspecified organism,                 unspecified whether acute organ dysfunction present  Multiple subsegmental pulmonary emboli without acute cor                 pulmonale.  Sonographer:    Darlina Sicilian RDCS Referring Phys: [id number] [place]  1. Left ventricular ejection fraction, by estimation, is 50 to 55%. The left ventricle has low normal function. The left ventricle has no regional wall motion abnormalities. There is severe asymmetric left ventricular hypertrophy of the basal and septal  segments. Left ventricular diastolic parameters are consistent with Grade II diastolic dysfunction (pseudonormalization). Elevated left ventricular end-diastolic pressure.  2. Right ventricular systolic function is normal. The right ventricular size is normal.  3. The mitral valve is abnormal. No evidence of mitral valve regurgitation. No evidence of mitral stenosis.  4. The aortic valve is tricuspid. There is mild calcification of the aortic valve. Aortic valve regurgitation is not visualized. Mild aortic valve sclerosis is present, with no evidence of aortic valve stenosis.  5. Aortic dilatation noted. There is mild dilatation of the aortic root, measuring 41 mm.  6. The inferior vena cava is normal in size with greater than 50% respiratory variability, suggesting right atrial pressure of 3 mmHg. FINDINGS  Left Ventricle: Left ventricular ejection fraction, by estimation, is 50 to 55%. The left ventricle has low normal function. The left ventricle has no regional wall motion abnormalities. Definity  contrast agent was given IV to delineate the left ventricular endocardial borders. The left ventricular internal cavity size was normal in size. There is severe asymmetric left ventricular hypertrophy of the basal and septal segments. Left ventricular diastolic parameters are consistent with Grade II diastolic dysfunction (pseudonormalization). Elevated left ventricular end-diastolic pressure. Right Ventricle: The right ventricular size is normal. No increase in right ventricular wall thickness. Right ventricular systolic function is normal. Left Atrium: Left atrial size was normal in size. Right Atrium: Right atrial size was normal in size. Pericardium: There is no evidence of pericardial effusion. Mitral Valve: The mitral valve is abnormal. There is mild thickening of the mitral valve leaflet(s). There is mild calcification of the mitral valve leaflet(s). Mild mitral annular calcification. No evidence of mitral valve regurgitation. No evidence of mitral valve stenosis. Tricuspid Valve: The tricuspid valve is normal in structure. Tricuspid valve regurgitation is not demonstrated. No evidence of tricuspid stenosis. Aortic Valve: The aortic valve is tricuspid. There is mild calcification of the aortic valve. Aortic valve regurgitation is not visualized. Mild aortic valve sclerosis is present, with no evidence of aortic valve stenosis. Pulmonic Valve: The pulmonic valve was normal in structure. Pulmonic valve regurgitation is not visualized. No evidence of pulmonic stenosis. Aorta: The aortic root is normal in size and structure and aortic dilatation noted. There is mild dilatation of the aortic root, measuring 41 mm. Venous: The inferior vena cava is normal in size with greater than 50% respiratory variability, suggesting right atrial pressure of 3 mmHg. IAS/Shunts: No atrial level shunt detected by color flow Doppler.  LEFT VENTRICLE PLAX 2D LVIDd:         3.20 cm     Diastology LVIDs:         2.60 cm     LV e'  medial:    3.15 cm/s LV PW:         1.40 cm     LV E/e' medial:  17.1 LV IVS:        2.00 cm     LV e' lateral:   3.37 cm/s LVOT diam:     2.00  cm     LV E/e' lateral: 16.0 LV SV:         41 LV SV Index:   19 LVOT Area:     3.14 cm  LV Volumes (MOD) LV vol d, MOD A2C: 91.2 ml LV vol d, MOD A4C: 78.4 ml LV vol s, MOD A2C: 43.8 ml LV vol s, MOD A4C: 36.8 ml LV SV MOD A2C:     47.4 ml LV SV MOD A4C:     78.4 ml LV SV MOD BP:      44.4 ml RIGHT VENTRICLE RV S prime:     8.16 cm/s LEFT ATRIUM             Index       RIGHT ATRIUM          Index LA diam:        3.00 cm 1.40 cm/m  RA Area:     6.38 cm LA Vol (A2C):   20.1 ml 9.37 ml/m  RA Volume:   7.69 ml  3.58 ml/m LA Vol (A4C):   21.3 ml 9.93 ml/m LA Biplane Vol: 21.6 ml 10.07 ml/m  AORTIC VALVE LVOT Vmax:   80.50 cm/s LVOT Vmean:  60.800 cm/s LVOT VTI:    0.130 m  AORTA Ao Root diam: 4.10 cm Ao Asc diam:  3.40 cm MITRAL VALVE MV Area (PHT): 3.01 cm    SHUNTS MV Decel Time: 252 msec    Systemic VTI:  0.13 m MV E velocity: 53.80 cm/s  Systemic Diam: 2.00 cm MV A velocity: 81.50 cm/s MV E/A ratio:  0.66 Jenkins Rouge MD Electronically signed by Jenkins Rouge MD Signature Date/Time: 05/26/2020/2:30:51 PM    Final    VAS [place] LOWER EXTREMITY VENOUS (DVT)  Result Date: 05/26/2020  Lower Venous DVT Study Indications: Edema, and Swelling.  Comparison Study: 05/11/20 previous Performing Technologist: Abram Sander RVS  Examination Guidelines: A complete evaluation includes B-mode imaging, spectral Doppler, color Doppler, and power Doppler as needed of all accessible portions of each vessel. Bilateral testing is considered an integral part of a complete examination. Limited examinations for reoccurring indications may be performed as noted. The reflux portion of the exam is performed with the patient in reverse Trendelenburg.  +---------+---------------+---------+-----------+----------+--------------+ RIGHT    CompressibilityPhasicitySpontaneityPropertiesThrombus Aging  +---------+---------------+---------+-----------+----------+--------------+ CFV      Full           Yes      Yes                                 +---------+---------------+---------+-----------+----------+--------------+ SFJ      Full                                                        +---------+---------------+---------+-----------+----------+--------------+ FV Prox  Full                                                        +---------+---------------+---------+-----------+----------+--------------+ FV Mid   Full                                                        +---------+---------------+---------+-----------+----------+--------------+  FV DistalFull                                                        +---------+---------------+---------+-----------+----------+--------------+ PFV      Full                                                        +---------+---------------+---------+-----------+----------+--------------+ POP      Full           Yes      Yes                                 +---------+---------------+---------+-----------+----------+--------------+ PTV      Full                                                        +---------+---------------+---------+-----------+----------+--------------+ PERO     Full                                                        +---------+---------------+---------+-----------+----------+--------------+   +---------+---------------+---------+-----------+----------+-----------------+ LEFT     CompressibilityPhasicitySpontaneityPropertiesThrombus Aging    +---------+---------------+---------+-----------+----------+-----------------+ CFV      Full           Yes      Yes                                    +---------+---------------+---------+-----------+----------+-----------------+ SFJ      Full                                                            +---------+---------------+---------+-----------+----------+-----------------+ FV Prox  Full                                                           +---------+---------------+---------+-----------+----------+-----------------+ FV Mid   Full                                                           +---------+---------------+---------+-----------+----------+-----------------+ FV DistalFull                                                           +---------+---------------+---------+-----------+----------+-----------------+  PFV      Full                                                           +---------+---------------+---------+-----------+----------+-----------------+ POP      None           No       No                   Age Indeterminate +---------+---------------+---------+-----------+----------+-----------------+ PTV      None                                         Age Indeterminate +---------+---------------+---------+-----------+----------+-----------------+ PERO     None                                         Age Indeterminate +---------+---------------+---------+-----------+----------+-----------------+     Summary: RIGHT: - There is no evidence of deep vein thrombosis in the lower extremity.  - No cystic structure found in the popliteal fossa.  LEFT: - Findings consistent with age indeterminate deep vein thrombosis involving the left popliteal vein, left posterior tibial veins, and left peroneal veins. - No cystic structure found in the popliteal fossa.  *See table(s) above for measurements and observations. Electronically signed by Harold Barban MD on 05/26/2020 at 9:28:41 PM.    Final

## 2020-06-15 NOTE — Progress Notes (Signed)
PT Cancellation Note  Patient Details Name: Tyrone Roman MRN: [id number] DOB: [birth year]   Cancelled Treatment:    Reason Eval/Treat Not Completed: Medical issues which prohibited therapy per discussion with RN, patient still not medically appropriate for PT. PT will sign off for now due to ongoing poor medical status/prognosis- however, if he improves medically, please feel free to reorder PT consult if/when appropriate.    [name], DPT, PN1   Supplemental Physical Therapist [facility]    Pager [phone] Acute Rehab Office [phone]

## 2020-06-15 NOTE — Progress Notes (Addendum)
Overnight,    Pt's core temp found to be low several times throughout the shift.  MD on call notified.  Pt arouses to voice.  MD on call advised to use the bair hugger once core temp reached 96.5.  Pt remains bundled in several blankets and states his temperature feels comfortable.   During the night, the patient stated that he is having a hard time sleeping because there is much going on in his head.  He states he is worried about dying and how is family will handle it.  He states that he knows that he cannot keep this up forever.    I asked the patient if he would like to speak with the chaplain about it and he agreed.  Chaplain on call paged.

## 2020-06-16 DIAGNOSIS — Z515 Encounter for palliative care: Secondary | ICD-10-CM | POA: Diagnosis not present

## 2020-06-16 DIAGNOSIS — I10 Essential (primary) hypertension: Secondary | ICD-10-CM | POA: Diagnosis not present

## 2020-06-16 DIAGNOSIS — J439 Emphysema, unspecified: Secondary | ICD-10-CM | POA: Diagnosis not present

## 2020-06-16 DIAGNOSIS — Z7189 Other specified counseling: Secondary | ICD-10-CM

## 2020-06-16 DIAGNOSIS — J9601 Acute respiratory failure with hypoxia: Secondary | ICD-10-CM | POA: Diagnosis not present

## 2020-06-16 DIAGNOSIS — U071 COVID-19: Secondary | ICD-10-CM | POA: Diagnosis not present

## 2020-06-16 LAB — CBC WITH DIFFERENTIAL/PLATELET
Abs Immature Granulocytes: 0.03 10*3/uL (ref 0.00–0.07)
Basophils Absolute: 0 10*3/uL (ref 0.0–0.1)
Basophils Relative: 0 %
Eosinophils Absolute: 0 10*3/uL (ref 0.0–0.5)
Eosinophils Relative: 0 %
HCT: 36.1 % — ABNORMAL LOW (ref 39.0–52.0)
Hemoglobin: 11.8 g/dL — ABNORMAL LOW (ref 13.0–17.0)
Immature Granulocytes: 1 %
Lymphocytes Relative: 2 %
Lymphs Abs: 0.1 10*3/uL — ABNORMAL LOW (ref 0.7–4.0)
MCH: 28.9 pg (ref 26.0–34.0)
MCHC: 32.7 g/dL (ref 30.0–36.0)
MCV: 88.3 fL (ref 80.0–100.0)
Monocytes Absolute: 0.1 10*3/uL (ref 0.1–1.0)
Monocytes Relative: 2 %
Neutro Abs: 3.9 10*3/uL (ref 1.7–7.7)
Neutrophils Relative %: 95 %
Platelets: 96 10*3/uL — ABNORMAL LOW (ref 150–400)
RBC: 4.09 MIL/uL — ABNORMAL LOW (ref 4.22–5.81)
RDW: 19.5 % — ABNORMAL HIGH (ref 11.5–15.5)
WBC: 4.1 10*3/uL (ref 4.0–10.5)
nRBC: 0 % (ref 0.0–0.2)

## 2020-06-16 LAB — COMPREHENSIVE METABOLIC PANEL
ALT: 32 U/L (ref 0–44)
AST: 26 U/L (ref 15–41)
Albumin: 1.7 g/dL — ABNORMAL LOW (ref 3.5–5.0)
Alkaline Phosphatase: 78 U/L (ref 38–126)
Anion gap: 9 (ref 5–15)
BUN: 41 mg/dL — ABNORMAL HIGH (ref 8–23)
CO2: 23 mmol/L (ref 22–32)
Calcium: 9.5 mg/dL (ref 8.9–10.3)
Chloride: 102 mmol/L (ref 98–111)
Creatinine, Ser: 0.97 mg/dL (ref 0.61–1.24)
GFR, Estimated: >60 mL/min (ref >60)
Glucose, Bld: 423 mg/dL — ABNORMAL HIGH (ref 70–99)
Potassium: 4.3 mmol/L (ref 3.5–5.1)
Sodium: 134 mmol/L — ABNORMAL LOW (ref 135–145)
Total Bilirubin: 0.6 mg/dL (ref 0.3–1.2)
Total Protein: 4.7 g/dL — ABNORMAL LOW (ref 6.5–8.1)

## 2020-06-16 LAB — FERRITIN: Ferritin: 2331 ng/mL — ABNORMAL HIGH (ref 24–336)

## 2020-06-16 LAB — GLUCOSE, CAPILLARY
Glucose-Capillary: 384 mg/dL — ABNORMAL HIGH (ref 70–99)
Glucose-Capillary: 327 mg/dL — ABNORMAL HIGH (ref 70–99)
Glucose-Capillary: 236 mg/dL — ABNORMAL HIGH (ref 70–99)

## 2020-06-16 LAB — PHOSPHORUS: Phosphorus: 2.4 mg/dL — ABNORMAL LOW (ref 2.5–4.6)

## 2020-06-16 LAB — MAGNESIUM: Magnesium: 1.9 mg/dL (ref 1.7–2.4)

## 2020-06-16 LAB — D-DIMER, QUANTITATIVE: D-Dimer, Quant: 1.19 ug/mL-FEU — ABNORMAL HIGH (ref 0.00–0.50)

## 2020-06-16 LAB — C-REACTIVE PROTEIN: CRP: 18.8 mg/dL — ABNORMAL HIGH (ref <1.0)

## 2020-06-16 MED ORDER — HALOPERIDOL LACTATE 5 MG/ML IJ SOLN: 0.5000 mg | INTRAMUSCULAR | Status: DC | PRN

## 2020-06-16 MED ORDER — ONDANSETRON 4 MG PO TBDP: 4.0000 mg | ORAL_TABLET | Freq: Four times a day (QID) | ORAL | Status: DC | PRN

## 2020-06-16 MED ORDER — HALOPERIDOL 0.5 MG PO TABS
0.5000 mg | ORAL_TABLET | ORAL | Status: DC | PRN
  Filled 2020-06-16: qty 1

## 2020-06-16 MED ORDER — LORAZEPAM 1 MG PO TABS: 1.0000 mg | ORAL_TABLET | ORAL | Status: DC | PRN

## 2020-06-16 MED ORDER — GLYCOPYRROLATE 0.2 MG/ML IJ SOLN: 0.2000 mg | INTRAMUSCULAR | Status: DC | PRN

## 2020-06-16 MED ORDER — MORPHINE SULFATE (CONCENTRATE) 10 MG/0.5ML PO SOLN: 5.0000 mg | ORAL | Status: DC | PRN

## 2020-06-16 MED ORDER — ACETAMINOPHEN 650 MG RE SUPP: 650.0000 mg | Freq: Four times a day (QID) | RECTAL | Status: DC | PRN

## 2020-06-16 MED ORDER — SODIUM CHLORIDE 0.9 % IV SOLN: 250.0000 mL | INTRAVENOUS | Status: DC | PRN

## 2020-06-16 MED ORDER — PREDNISONE 5 MG PO TABS
30.0000 mg | ORAL_TABLET | Freq: Two times a day (BID) | ORAL | Status: DC
  Administered 2020-06-16: 30 mg via ORAL
  Filled 2020-06-16: qty 2

## 2020-06-16 MED ORDER — DIPHENHYDRAMINE HCL 50 MG/ML IJ SOLN: 12.5000 mg | INTRAMUSCULAR | Status: DC | PRN

## 2020-06-16 MED ORDER — MORPHINE SULFATE (CONCENTRATE) 10 MG/0.5ML PO SOLN
5.0000 mg | Freq: Four times a day (QID) | ORAL | Status: DC
Start: 2020-06-16 — End: 2020-06-17
  Filled 2020-06-16 (×3): qty 0.5

## 2020-06-16 MED ORDER — ONDANSETRON HCL 4 MG/2ML IJ SOLN: 4.0000 mg | Freq: Four times a day (QID) | INTRAMUSCULAR | Status: DC | PRN

## 2020-06-16 MED ORDER — BIOTENE DRY MOUTH MT LIQD: 15.0000 mL | OROMUCOSAL | Status: DC | PRN

## 2020-06-16 MED ORDER — BISACODYL 10 MG RE SUPP: 10.0000 mg | Freq: Every day | RECTAL | Status: DC | PRN

## 2020-06-16 MED ORDER — ACETAMINOPHEN 325 MG PO TABS: 650.0000 mg | ORAL_TABLET | Freq: Four times a day (QID) | ORAL | Status: DC | PRN

## 2020-06-16 MED ORDER — POLYVINYL ALCOHOL 1.4 % OP SOLN
1.0000 [drp] | Freq: Four times a day (QID) | OPHTHALMIC | Status: DC | PRN
  Filled 2020-06-16: qty 15

## 2020-06-16 MED ORDER — SODIUM CHLORIDE 0.9% FLUSH: 3.0000 mL | INTRAVENOUS | Status: DC | PRN

## 2020-06-16 MED ORDER — HALOPERIDOL LACTATE 2 MG/ML PO CONC
0.5000 mg | ORAL | Status: DC | PRN
  Filled 2020-06-16: qty 0.3

## 2020-06-16 MED ORDER — LORAZEPAM 2 MG/ML PO CONC
1.0000 mg | ORAL | Status: DC | PRN
  Filled 2020-06-16: qty 0.5

## 2020-06-16 MED ORDER — MORPHINE SULFATE (PF) 2 MG/ML IV SOLN
2.0000 mg | INTRAVENOUS | Status: DC | PRN
  Administered 2020-06-17 (×2): 2 mg via INTRAVENOUS
  Filled 2020-06-16 (×2): qty 1

## 2020-06-16 MED ORDER — HYDROMORPHONE HCL 1 MG/ML IJ SOLN: 0.5000 mg | INTRAMUSCULAR | Status: DC | PRN

## 2020-06-16 MED ORDER — SODIUM CHLORIDE 0.9% FLUSH
3.0000 mL | Freq: Two times a day (BID) | INTRAVENOUS | Status: DC
  Administered 2020-06-16 – 2020-06-17 (×2): 3 mL via INTRAVENOUS

## 2020-06-16 MED ORDER — LORAZEPAM 2 MG/ML IJ SOLN: 1.0000 mg | INTRAMUSCULAR | Status: DC | PRN

## 2020-06-16 MED ORDER — GLYCOPYRROLATE 1 MG PO TABS
1.0000 mg | ORAL_TABLET | ORAL | Status: DC | PRN
  Filled 2020-06-16: qty 1

## 2020-06-16 NOTE — Progress Notes (Signed)
This chaplain responded to the MD consult for spiritual care.  The chaplain is greeted by the Pt. with "I'm okay" and reaches out to hold my hand.  The Pt. shares he is comfortable and  without  pain.  The Pt. family replies to the chaplain's request for a sharing of names before the chaplain and family pray together. The room is filled with God's love and peace.  The chaplain is available for F/U spiritual care as needed, [phone].

## 2020-06-16 NOTE — TOC Progression Note (Signed)
Transition of Care [facility]) - Progression Note    Patient Details  Name: TRESTAN VAHLE MRN: [id number] Date of Birth: 1945/03/07  Transition of Care [facility]) CM/SW [place], LCSW Phone Number: 06/16/2020, 1:13 PM  Clinical Narrative:    CSW received consult from MD to begin Home Hospice referral with [place] due to patient's higher oxygen needs. CSW sent referral to [facility], [place], for review.    Expected Discharge Plan: Home w Hospice Care Barriers to Discharge: Continued Medical Work up  Expected Discharge Plan and Services Expected Discharge Plan: [place]   Discharge Planning Services: CM Consult   Living arrangements for the past 2 months: Single Family Home                                       Social Determinants of Health (SDOH) Interventions    Readmission Risk Interventions No flowsheet data found.

## 2020-06-16 NOTE — Consult Note (Signed)
Consultation Note Date: 06/16/2020   Patient Name: Tyrone Roman  DOB: Jan 26, 1946  MRN: [id number]  Age / Sex: 75 y.o., male  PCP: Tyrone Morale, MD Referring Physician: Jonetta Osgood, MD  Reason for Consultation: Establishing goals of care, Hospice Evaluation and Non pain symptom management  HPI/Patient Profile: 75 y.o. male  with past medical history of rheumatoid arthritis, CAD s/p PCI, hypertension, hyperlipidemia, COPD, BPH, COVID infection since Jan 2022 admitted on 07/05/2020 with shortness of breath with ongoing COVID infection. Unfortunately despite being properly vaccinated and having multiple treatments for COVID infection his body has not been able to produce antibodies to fight infection. He continues to require multiple hospitalizations and requiring HFNC and interested in comfort care at this time.   Clinical Assessment and Goals of Care: I met today at Tyrone Roman's bedside along with his daughter, Tyrone Roman, and his ex-wife but good friend. Tyrone Roman got her sister, Tyrone Roman, on speaker phone for our conversation. I discussed with them goals of care and expectations. Tyrone Roman confirms his desire for comfort and they had previously discussed plan for hopes to return home with hospice care. I spent time discussing with Tyrone Roman what is most important to him and it seems that being surrounded by his family and being comfortable is most important. We discussed the likely possibility that we may not be able to get him home but we can allow his family to come and visit and be with him and we can provide liberal medications to ensure his comfort. He seems very pleased and content with this plan and remaining in hospital. If we get the opportunity to titrate oxygen down and find a window to realistically get him home then we will although I believe this is unlikely. Tyrone Roman also endorses that he does want  ongoing support from spiritual care chaplains and found this very helpful.   I called and discussed further with daughter/HCPOA, Tyrone Roman. I discussed further with her plan for conservative medication for his shortness of breath but my expectation that he will likely require more and more medication to provide him relief that will lead to further sedation. He is already very fatigued and weak and I anticipate that this will also continue to progress without aggressive treatment and shift to focus on comfort. We discussed plan to be conservative with medication and continue high oxygen to allow Tyrone Roman best opportunity to have meaningful visits with his family/friends. As his ability to have these meaningful interactions decreased and his mental status changes we will at that time focus on titrating down his oxygen after ensuring his comfort. Tyrone Roman understands and agrees with plan.   All questions/concerns addressed. Emotional support provided.   Primary Decision Maker PATIENT    SUMMARY OF RECOMMENDATIONS   - DNR - Comfort care - Liberalized visitation 3 persons in room at a time and may change out. Also allow family member to be present 24/7 as desired/able.  - Do not de-escalate oxygen until his mentation decreases.   Code  Status/Advance Care Planning:  DNR   Symptom Management:   Liberal as needed medications to ensure comfort.   Tyrone Roman 5 mg every 6 hours scheduled to ensure relief from shortness of breath. This can be titrated in dose/frequecy as needed to achieve relief.   Palliative Prophylaxis:   Aspiration, Delirium Protocol, Frequent Pain Assessment, Oral Care and Turn Reposition  Additional Recommendations (Limitations, Scope, Preferences):  Full Comfort Care  Psycho-social/Spiritual:   Desire for further Chaplaincy support:yes  Additional Recommendations: Grief/Bereavement Support  Prognosis:   Hours - Days  Discharge Planning: Anticipated Hospital Death       Primary Diagnoses: Present on Admission: . Acute respiratory failure with hypoxia ([place]) . Post-COVID-19 syndrome manifesting as chronic cough . Hyperlipidemia . COPD (chronic obstructive pulmonary disease) ([place]) . Essential hypertension . Pulmonary embolus ([place]) . SIRS (systemic inflammatory response syndrome) ([facility]) . Thrush   I have reviewed the medical record, interviewed the patient and family, and examined the patient. The following aspects are pertinent.  Past Medical History:  Diagnosis Date  . Allergy   . Barrett's esophageal ulceration   . BPH (benign prostatic hyperplasia)   . CAD (coronary artery disease)    sees Tyrone Roman  . Cataract    both eyes removed   . Colonic polyp   . Concussion with loss of consciousness of 30 minutes or less   . Contact dermatitis   . Contact with or exposure to venereal diseases   . COPD (chronic obstructive pulmonary disease) ([place])    sees Tyrone Roman   . Diverticulosis of colon   . Dysphagia   . GERD (gastroesophageal reflux disease)    past hx- on meds   . HNP (herniated nucleus pulposus), lumbar    recurrent  . Hyperlipidemia   . Hypertension   . Myocardial infarction [facility])    2011- stent placed  . Nephrolithiasis   . Rheumatoid arthritis [facility])    sees Tyrone Roman   . SOB (shortness of breath)    Social History   Socioeconomic History  . Marital status: Divorced    Spouse name: Not on file  . Number of children: 4  . Years of education: Not on file  . Highest education level: Not on file  Occupational History  . Occupation: ENGINEER    Employer: [employer]  Tobacco Use  . Smoking status: Former Smoker    Packs/day: 1.00    Years: 20.00    Pack years: 20.00    Types: Cigarettes    Quit date: 10/12/1988    Years since quitting: 31.6  . Smokeless tobacco: Never Used  . Tobacco comment: 1960's   Vaping Use  . Vaping Use: Never used  Substance and Sexual Activity  . Alcohol use: Not  Currently    Alcohol/week: 0.0 standard drinks    Comment: rare  . Drug use: No  . Sexual activity: Not on file  Other Topics Concern  . 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       04/14/2018:   Lives alone, has girlfriend who visits, main source of emotional support   Has been struggling with recent hospitalizations/ill health following sepsis and back surgeries   Social Determinants of Health   Financial Resource Strain: Low Risk   . Difficulty of Paying Living Expenses: Not hard at all  Food Insecurity: No Food Insecurity  . Worried About Estate manager/land agent  of Food in the Last Year: Never true  . Ran Out of Food in the Last Year: Never true  Transportation Needs: No Transportation Needs  . Lack of Transportation (Medical): No  . Lack of Transportation (Non-Medical): No  Physical Activity: Insufficiently Active  . Days of Exercise per Week: 2 days  . Minutes of Exercise per Session: 60 min  Stress: Stress Concern Present  . Feeling of Stress : To some extent  Social Connections: Moderately Isolated  . Frequency of Communication with Friends and Family: More than three times a week  . Frequency of Social Gatherings with Friends and Family: Once a week  . Attends Religious Services: Never  . Active Member of Clubs or Organizations: Yes  . Attends Archivist Meetings: More than 4 times per year  . Marital Status: Divorced   Family History  Problem Relation Age of Onset  . Heart attack Father        cardiovascular disorder  . Arthritis Father        family hx  . Colon cancer Father        mets  . Prostate cancer Father        1st degree relative  . Arthritis Mother   . Colon polyps Mother   . Melanoma Mother   . Dementia Mother   . Diabetes Paternal Uncle   . Colon cancer Paternal Uncle   . Stomach cancer Paternal Uncle   . Melanoma Paternal Uncle   . Cancer Maternal  Grandmother   . Skin cancer Daughter   . Colon polyps Sister   . Esophageal cancer Neg Hx   . Rectal cancer Neg Hx    Scheduled Meds: . morphine CONCENTRATE  5 mg Sublingual Q6H  . predniSONE  30 mg Oral BID WC  . sodium chloride flush  3 mL Intravenous Q12H   Continuous Infusions: . sodium chloride     PRN Meds:.sodium chloride, acetaminophen **OR** acetaminophen, antiseptic oral rinse, bisacodyl, diphenhydrAMINE, glycopyrrolate **OR** glycopyrrolate **OR** glycopyrrolate, haloperidol **OR** haloperidol **OR** haloperidol lactate, LORazepam **OR** LORazepam **OR** LORazepam, morphine injection **OR** morphine CONCENTRATE, ondansetron **OR** ondansetron (ZOFRAN) IV, polyvinyl alcohol, sodium chloride flush Allergies  Allergen Reactions  . Cephalexin Shortness Of Breath, Rash and Other (See Comments)    Tolerated Augmentin 2015 and 2017 (12-2017). Tolerated nafcillin 12/2017 PATIENT HAS HAD A PCN REACTION WITH IMMEDIATE RASH, FACIAL/TONGUE/THROAT SWELLING, SOB, OR LIGHTHEADEDNESS WITH HYPOTENSION:  #  #  YES  #  #  Has patient had a PCN reaction causing severe rash involving mucus membranes or skin necrosis: No Has patient had a PCN reaction that required hospitalization: No Has patient had a PCN reaction occurring within the last 10 years: No If all of the above answers are "NO", then may proceed  . Clarithromycin Shortness Of Breath and Rash  . Certolizumab Pegol Hives  . Tocilizumab Hives  . Doxycycline Rash  . Hydroxyzine Rash  . Lidoderm Other (See Comments)    "made me act weird"  . Pyrithione Zinc Rash   Review of Systems  Constitutional: Positive for activity change and fatigue.  Respiratory: Positive for shortness of breath.   Neurological: Positive for weakness.    Physical Exam Vitals and nursing note reviewed.  Constitutional:      General: He is not in acute distress.    Appearance: He is ill-appearing.  Cardiovascular:     Rate and Rhythm: Normal rate.   Pulmonary:     Effort: No tachypnea, accessory muscle usage  or respiratory distress.     Comments: 40L oxygen with NRB Abdominal:     General: Abdomen is flat.  Neurological:     Mental Status: He is alert and oriented to person, place, and time.     Comments: Fatigued     Vital Signs: BP 128/81 (BP Location: Left Arm)   Pulse 63   Temp (!) 96.1 F (35.6 C) (Rectal)   Resp 20   SpO2 93%  Pain Scale: 0-10   Pain Score: 0-No pain   SpO2: SpO2: 93 % O2 Device:SpO2: 93 % O2 Flow Rate: .O2 Flow Rate (L/min): 45 L/min  IO: Intake/output summary:   Intake/Output Summary (Last 24 hours) at 06/16/2020 1334 Last data filed at 06/16/2020 2767 Gross per 24 hour  Intake 120 ml  Output 775 ml  Net -655 ml    LBM:   Baseline Weight:   Most recent weight:       Palliative Assessment/Data:     Time In: 1300 Time Out: 1410 Time Total: 70 min Greater than 50%  of this time was spent counseling and coordinating care related to the above assessment and plan.  Signed by: Vinie Sill, NP Palliative Medicine Team Pager # [phone] (M-F 8a-5p) Team Phone # [phone] (Nights/Weekends)

## 2020-06-16 NOTE — Progress Notes (Signed)
Nutrition Brief Note  Chart reviewed. Pt now transitioning to comfort care.  No further nutrition interventions planned at this time.  Please re-consult as needed.   Sriya Kroeze W, RD, LDN, CDCES Registered Dietitian II Certified Diabetes Care and Education Specialist Please refer to [facility]ION for RD and/or RD on-call/weekend/after hours pager   

## 2020-06-16 NOTE — TOC Initial Note (Signed)
Transition of Care [facility]) - Initial/Assessment Note    Patient Details  Name: Tyrone Roman MRN: [id number] Date of Birth: 07-08-1945  Transition of Care [facility]) CM/SW Contact:    Verdell Carmine, RN Phone Number: 06/16/2020, 11:17 AM  Clinical Narrative:                 Admitted with respiratory failure still testing positive for COVID. Is immunocompromised takes rituxin. Has RA. Is engaged in [facility] for PT RN and aide services. Has not really ambulated well since the beginning of the year.  Is on high flow oxygen 40Liters currently. Palliative care should be consulted to define Goals of care. CM will follow for needs/  Expected Discharge Plan: Home w Hospice Care Barriers to Discharge: Continued Medical Work up   Patient Goals and CMS Choice        Expected Discharge Plan and Services Expected Discharge Plan: [place]   Discharge Planning Services: CM Consult   Living arrangements for the past 2 months: Single Family Home                                      Prior Living Arrangements/Services Living arrangements for the past 2 months: Single Family Home Lives with:: Self Patient language and need for interpreter reviewed:: Yes        Need for Family Participation in Patient Care: Yes (Comment) Care giver support system in place?: Yes (comment) Current home services: DME,Home PT,Homehealth aide,Home RN Criminal Activity/Legal Involvement Pertinent to Current Situation/Hospitalization: No - Comment as needed  Activities of Daily Living      Permission Sought/Granted                  Emotional Assessment       Orientation: : Oriented to Self,Oriented to Place,Oriented to  Time,Oriented to Situation Alcohol / Substance Use: Not Applicable Psych Involvement: No (comment)  Admission diagnosis:  Acute respiratory failure with hypoxia ([place]) [J96.01] Acute on chronic respiratory failure with hypoxia ([place]) [J96.21] Patient Active  Problem List   Diagnosis Date Noted  . Thrush 29-Jun-2020  . Goals of care, counseling/discussion 06/29/2020  . Acute hypoxemic respiratory failure ([place]) 05/25/2020  . Pulmonary embolus ([place]) 05/25/2020  . SIRS (systemic inflammatory response syndrome) ([place]) 05/25/2020  . Elevated troponin 05/25/2020  . Weakness of both lower extremities   . FUO (fever of unknown origin)   . Sepsis due to pneumonia ([place]) 05/09/2020  . Post-COVID-19 syndrome manifesting as chronic cough 05/09/2020  . COPD without exacerbation ([place]) 05/09/2020  . Essential hypertension 04/26/2020  . Acute hypoxemic respiratory failure due to COVID-19 ([place]) 04/03/2020  . Acute respiratory failure with hypoxia ([place]) 03/28/2020  . Physical deconditioning 03/26/2020  . Normocytic anemia 03/26/2020  . Severe sepsis ([place]) 03/25/2020  . Multifocal pneumonia 03/17/2020  . COPD with acute exacerbation ([place]) 03/17/2020  . Hypoxia 03/17/2020  . CAD (coronary artery disease) 03/17/2020  . Thrombocytopenia ([place]) 03/08/2020  . Stage 3b chronic kidney disease ([place]) 03/08/2020  . Pneumonia due to COVID-19 virus 02/29/2020  . COVID-19 02/29/2020  . Closed nondisplaced fracture of shaft of third metacarpal bone of left hand 03/04/2019  . Wound infection after surgery 01/01/2018  . Medication monitoring encounter 01/01/2018  . Sepsis ([place]) 12/10/2017  . HNP (herniated nucleus pulposus), lumbar 10/07/2017  . Lumbar herniated disc 10/07/2017  . Psoriasis of scalp 03/27/2016  .  Rheumatoid arthritis ([place]) 03/25/2013  . GI bleeding 02/20/2012  . Dyspnea on exertion 09/08/2010  . COPD (chronic obstructive pulmonary disease) ([place]) 08/04/2010  . Bruising 08/04/2010  . TINNITUS 12/16/2009  . Dizziness and giddiness 12/16/2009  . NECK SPRAIN AND STRAIN 10/21/2009  . LUMBAR SPRAIN AND STRAIN 10/21/2009  . Hyperlipidemia 06/10/2009  . Coronary artery disease of native artery of native heart with stable angina pectoris ([place]) 06/10/2009  .  GERD 06/10/2009  . BARRETTS ESOPHAGUS 06/10/2009  . CONCUSSION WITH LOC OF 30 MINUTES OR LESS 02/28/2009  . NEPHROLITHIASIS 07/01/2007  . CONTACT DERMATITIS 12/12/2006  . BPH (benign prostatic hyperplasia) 11/04/2006  . COLONIC POLYPS 10/21/2003  . DIVERTICULOSIS, COLON 10/21/2003   PCP:  [name], MD Pharmacy:   [facility], [place] [address] Phone: [phone] Fax: [phone]     Social Determinants of Health (SDOH) Interventions    Readmission Risk Interventions No flowsheet data found.

## 2020-06-16 NOTE — Progress Notes (Signed)
PROGRESS NOTE                                                                                                                                                                                                             Patient Demographics:    Tyrone Roman, is a 75 y.o. male, DOB - 1945-11-06, [id number]  Outpatient Primary MD for the patient is [name], MD   Admit date - 07/05/2020   LOS - 4  Chief Complaint  Patient presents with  . Shortness of Breath       Brief Narrative: Patient is a 75 y.o. male with PMHx of RA, COPD, CAD s/p PCI, HTN, HLD-who had COVID-19 infection in January 2021-since then he has had numerous hospitalizations for recurrent pneumonia/fever-ultimately felt to be due to persistent/acute COVID-19 infection in a setting of severe immunocompromised state (s/p Rituxan infusion-COVID IgG antibody negative inspite of vaccination/natural infection).  His most recent hospitalization was from 4/20-4/28-during this hospitalization-he presented with worsening respiratory failure-and responded to IV steroids and monoclonal antibody infusion-he was discharged home on his usual regimen of 2-3 L.  He again presented to the hospital on 5/8 with worsening hypoxemia-failed to be due to ongoing COVID-19 pneumonitis-given monoclonal antibody, high titer convalescent plasma, IV steroids, IV remdesivir without any significant clinical improvement.  He remains severely hypoxemic on heated high flow and NRB.  COVID-19 vaccinated status: Vaccinated including booster  Significant Events: 5/8>> Admit to [facility] for acute on chronic hypoxia-due to COVID-19 pneumonitis. 5/12>> transitioning to comfort measures.  Significant studies: 5/8>> CTA chest: Residual PE in right lower lobe-diffuse pulmonary infiltrates.  COVID-19 medications: IV steroids: 5/8>> Remdesivir: 5/8>> COVID convalescent plasma: 5/9 x 1 COVID  convalescent plasma: 5/10 x 1 Monoclonal antibody: 5/10 x 1  Antibiotics: None  Microbiology data: 5/8 >> urine culture: Staph epidermidis/Enterococcus faecalis  5/8 >>blood culture: No growth 5/9>> respiratory virus panel: Negative  Procedures: None  Consults: ID, PCCM  DVT prophylaxis: rivaroxaban (XARELTO) tablet 20 mg     Subjective:    [name] today does not feel any better-he is on around 100% of FiO2-heated high flow at 50 L/min-in addition to 15 L via NRB.  He feels short of breath with minimal activity.  He has difficulty even eating due to shortness of breath.    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  He has deteriorated significantly this admission-he is very symptomatic and gets short of breath with minimal exertion (even eating!) in spite of being on 50 L of heated high flow and NRB.  I had extensive discussion with the patient-and family yesterday-and again today-since he continues to deteriorate-and has had numerous recent hospitalizations for the same issue-he is agreeable to be transitioned to full comfort measures-with the aim of getting him home with hospice care in the next few days.  I spoke with his daughter Tyrone Roman over the phone this morning-she is agreeable with this plan as well.  Plan is to stop steroids/Remdesivir/Eraxis and other medications-I have reached out to our palliative care team for assistance in managing his symptoms in order to get him home with hospice over the next few days.  O2 requirements:  SpO2: 90 % O2 Flow Rate (L/min): 45 L/min FiO2 (%): 100 %   COVID-19 Labs: Recent Labs    06/14/20 0202 06/15/20 0126 06/16/20 0106  DDIMER 1.48* 1.32* 1.19*  FERRITIN 2,194* 2,283* 2,331*  CRP 24.7* 23.9* 18.8*        Component Value Date/Time   BNP 294.8 (H) 06/01/2020 0250    Recent Labs  Lab 06/08/2020 1014 06/14/20 0202  PROCALCITON <0.10 0.77    Lab Results  Component Value Date   SARSCOV2NAA POSITIVE (A) 06/11/2020   SARSCOV2NAA POSITIVE (A) 05/25/2020   SARSCOV2NAA POSITIVE (A) 05/09/2020   SARSCOV2NAA POSITIVE (A) 03/25/2020      Pulmonary embolism: Stopping anticoagulation-as being transitioned to full comfort measures.  Oral thrush: Prior sputum culture positive for Candida glabrata-he was on Eraxis-this is being discontinued on 5/12.    CAD: No anginal symptoms-no role for aspirin.  Rheumatoid arthritis: On Rituxan previously-symptoms stable on steroids.  Has been on steroids since earlier this year-we will decrease steroids-and not plan on continuing on discharge.  Do not want to abruptly stop steroids while inpatient as this may precipitate hypotension/adrenal insufficiency.  COPD: Stable-not in exacerbation-continue bronchodilators for comfort.  BPH: Continue Flomax-keep Foley in place for comfort.  HTN: Stable-stop Lopressor.  HLD: Stop Zocor  DM-2: Stop all CBG checks-stop insulin administration-on full comfort measures.  Recent Labs    06/16/20 0127 06/16/20 0403 06/16/20 0739  GLUCAP 384* 327* 236*   Palliative care: DNR in place-see above-deteriorating-no improvement-extremely symptomatic with very high oxygen requirement.  After extensive discussion-family/patient agreeable to transition with full comfort measures.  Plan is to see if we can get him home-I have reached out to a palliative care team to see if he can do appropriate symptom control-and see if we can get him home.  Nutrition Problem: Nutrition Problem: Increased nutrient needs Etiology: acute illness (COVID-19) Signs/Symptoms: estimated needs Interventions: Ensure Enlive (each supplement provides 350kcal and 20 grams of protein),MVI,Magic cup  Obesity: Estimated body mass index is 24.91 kg/m as  calculated from the following:   Height as of 05/25/20: 6\' 2"  (1.88 m).   Weight as of 05/25/20: 88 kg.   RN pressure injury documentation: Pressure Injury 04/03/20 Sacrum Medial blanchable redness on sacrum (Active)  04/03/20 0341  Location: Sacrum  Location Orientation: Medial  Staging:   Wound Description (Comments): blanchable redness on sacrum  Present on Admission: Yes    GI prophylaxis: PPI  ABG:    Component Value Date/Time   PHART 7.480 (H) 06/13/2020 [id number]  PCO2ART 29.2 (L) 06/13/2020 0605   PO2ART 58.8 (L) 06/13/2020 0605   HCO3 21.1 06/13/2020 0605   TCO2 21 (L) 06/30/2020 0643   ACIDBASEDEF 1.6 06/13/2020 0605   O2SAT 87.2 06/13/2020 0605    Vent Settings: N/A FiO2 (%):  [100 %] 100 %  Condition - Extremely Guarded  Family Communication  : Daughter at bedside  Code Status :  DNR  Diet :  Diet Order            Diet Carb Modified Fluid consistency: Thin; Room service appropriate? Yes  Diet effective now                  Disposition Plan  :   Status is: Inpatient  Remains inpatient appropriate because:Inpatient level of care appropriate due to severity of illness   Dispo:  Patient From: Home  Planned Disposition: Home Hospice  Medically stable for discharge: No     Barriers to discharge: Severe hypoxia-transitioning to comfort measures-we will need to manage his severe hypoxia/dyspnea-before being discharged home with hospice care.  Antimicorbials  :    Anti-infectives (From admission, onward)   Start     Dose/Rate Route Frequency Ordered Stop   06/15/20 0830  anidulafungin (ERAXIS) 100 mg in sodium chloride 0.9 % 100 mL IVPB        100 mg 78 mL/hr over 100 Minutes Intravenous Every 24 hours 06/14/20 0738     06/14/20 1000  remdesivir 100 mg in sodium chloride 0.9 % 100 mL IVPB       "Followed by" Linked Group Details   100 mg 200 mL/hr over 30 Minutes Intravenous Daily 06/13/20 1058 07-07-2020 0959   06/14/20 0830  anidulafungin (ERAXIS)  200 mg in sodium chloride 0.9 % 200 mL IVPB        200 mg 78 mL/hr over 200 Minutes Intravenous  Once 06/14/20 0738 06/14/20 1520   06/13/20 1145  remdesivir 200 mg in sodium chloride 0.9% 250 mL IVPB       "Followed by" Linked Group Details   200 mg 580 mL/hr over 30 Minutes Intravenous Once 06/13/20 1058 06/15/20 1858   06/13/20 0900  sulfamethoxazole-trimethoprim (BACTRIM DS) 800-160 MG per tablet 1 tablet        1 tablet Oral Once per day on Mon Wed Fri 06/17/2020 0946     06/17/2020 1045  fluconazole (DIFLUCAN) IVPB 200 mg  Status:  Discontinued        200 mg 100 mL/hr over 60 Minutes Intravenous Every 24 hours 06/28/2020 0946 06/14/20 0738      Inpatient Medications  Scheduled Meds: . vitamin C  500 mg Oral Daily  . Chlorhexidine Gluconate Cloth  6 each Topical Daily  . docusate sodium  100 mg Oral BID  . feeding supplement  237 mL Oral TID BM  . insulin aspart  0-9 Units Subcutaneous Q4H  . insulin glargine  15 Units Subcutaneous QHS  . methylPREDNISolone (SOLU-MEDROL) injection  60 mg Intravenous Q8H  . metoprolol tartrate  50 mg Oral BID  . midodrine  5 mg Oral TID WC  . mometasone-formoterol  2 puff Inhalation BID  . multivitamin with minerals  1 tablet Oral Daily  . pantoprazole  40 mg Oral Daily  . rivaroxaban  20 mg Oral Q supper  . simvastatin  20 mg Oral Daily  . sodium chloride flush  3 mL Intravenous Q12H  . sodium chloride flush  3 mL Intravenous Q12H  . sulfamethoxazole-trimethoprim  1 tablet  Oral Once per day on Mon Wed Fri  . tamsulosin  0.4 mg Oral Daily  . umeclidinium bromide  1 puff Inhalation Daily  . zinc sulfate  220 mg Oral Daily   Continuous Infusions: . sodium chloride    . anidulafungin 100 mg (06/16/20 1113)  . remdesivir 100 mg in NS 100 mL 100 mg (06/16/20 0930)   PRN Meds:.sodium chloride, acetaminophen, albuterol, bisacodyl, chlorpheniramine-HYDROcodone, guaiFENesin-dextromethorphan, ipratropium-albuterol, melatonin, menthol-cetylpyridinium,  ondansetron **OR** ondansetron (ZOFRAN) IV, oxyCODONE, polyethylene glycol, sodium chloride flush, sodium phosphate   Time Spent in minutes  45    See all Orders from today for further details   Oren Binet M.D on 06/16/2020 at 11:16 AM  To page go to [email or web address] - use universal password  [facility] -  Office  [phone]    Objective:   Vitals:   06/16/20 0252 06/16/20 0404 06/16/20 0742 06/16/20 0822  BP:  (!) 132/99 (!) 146/85   Pulse: 78 69 79   Resp: (!) 23 17 20    Temp:  (!) 97.2 F (36.2 C)    TempSrc:  Axillary Axillary   SpO2: 97% 97% 93% 90%    Wt Readings from Last 3 Encounters:  05/25/20 88 kg  05/09/20 88 kg  05/05/20 88.5 kg     Intake/Output Summary (Last 24 hours) at 06/16/2020 1116 Last data filed at 06/16/2020 [id number] Gross per 24 hour  Intake 120 ml  Output 775 ml  Net -655 ml     Physical Exam Gen Exam:Alert awake-not in any distress HEENT:atraumatic, normocephalic Chest: B/L clear to auscultation anteriorly CVS:S1S2 regular Abdomen:soft non tender, non distended Extremities:no edema Neurology: Non focal Skin: no rash  Data Review:    CBC Recent Labs  Lab 06/25/2020 0320 06/17/2020 0643 06/13/20 0118 06/14/20 0202 06/15/20 0126 06/16/20 0106  WBC 9.6  --  7.3 4.8 5.2 4.1  HGB 14.9 11.9* 12.7* 11.5* 11.4* 11.8*  HCT 47.3 35.0* 39.8 34.6* 34.5* 36.1*  PLT PLATELET CLUMPS NOTED ON SMEAR, UNABLE TO ESTIMATE  --  74* PLATELET CLUMPS NOTED ON SMEAR, UNABLE TO ESTIMATE PLATELET CLUMPS NOTED ON SMEAR, UNABLE TO ESTIMATE 96*  MCV 91.1  --  89.6 87.4 88.2 88.3  MCH 28.7  --  28.6 29.0 29.2 28.9  MCHC 31.5  --  31.9 33.2 33.0 32.7  RDW 20.1*  --  19.9* 19.0* 19.3* 19.5*  LYMPHSABS 0.3*  --  0.1* 0.1* 0.1* 0.1*  MONOABS 0.2  --  0.1 0.0* 0.1 0.1  EOSABS 0.0  --  0.0 0.0 0.0 0.0  BASOSABS 0.0  --  0.0 0.0 0.0 0.0    Chemistries  Recent Labs  Lab 06/11/2020 0320 06/11/2020 0643 06/13/20 0118 06/14/20 0202 06/15/20 0126  06/16/20 0106  NA 132* 132* 129* 132* 134* 134*  K 4.6 4.3 3.9 2.8* 3.9 4.3  CL 101  --  97* 97* 102 102  CO2 23  --  25 22 22 23   GLUCOSE 233*  --  210* 310* 280* 423*  BUN 32*  --  29* 37* 37* 41*  CREATININE 0.98  --  0.92 1.07 0.87 0.97  CALCIUM 9.6  --  9.1 8.9 9.2 9.5  MG  --   --  1.8 1.8 1.8 1.9  AST 53*  --  32 36 38 26  ALT 61*  --  40 33 37 32  ALKPHOS 99  --  75 74 70 78  BILITOT 0.3  --  0.5 0.8 0.7 0.6   ------------------------------------------------------------------------------------------------------------------  No results for input(s): CHOL, HDL, LDLCALC, TRIG, CHOLHDL, LDLDIRECT in the last 72 hours.  Lab Results  Component Value Date   HGBA1C 6.3 (H) 05/12/2020   ------------------------------------------------------------------------------------------------------------------ No results for input(s): TSH, T4TOTAL, T3FREE, THYROIDAB in the last 72 hours.  Invalid input(s): FREET3 ------------------------------------------------------------------------------------------------------------------ Recent Labs    06/15/20 0126 06/16/20 0106  FERRITIN 2,283* 2,331*    Coagulation profile Recent Labs  Lab 07/02/2020 0320  INR 1.7*    Recent Labs    06/15/20 0126 06/16/20 0106  DDIMER 1.32* 1.19*    Cardiac Enzymes No results for input(s): CKMB, TROPONINI, MYOGLOBIN in the last 168 hours.  Invalid input(s): CK ------------------------------------------------------------------------------------------------------------------    Component Value Date/Time   BNP 294.8 (H) 06/01/2020 0250    Micro Results Recent Results (from the past 240 hour(s))  Resp Panel by RT-PCR (Flu A&B, Covid) Nasopharyngeal Swab     Status: Abnormal   Collection Time: 06/11/2020  3:10 AM   Specimen: Nasopharyngeal Swab; Nasopharyngeal(NP) swabs in vial transport medium  Result Value Ref Range Status   SARS Coronavirus 2 by RT PCR POSITIVE (A) NEGATIVE Final    Comment:  RESULT CALLED TO, READ BACK BY AND VERIFIED WITH: RN J VASQUEZ AT [id number] 06/17/2020 BY L BENFIELD (NOTE) SARS-CoV-2 target nucleic acids are DETECTED.  The SARS-CoV-2 RNA is generally detectable in upper respiratory specimens during the acute phase of infection. Positive results are indicative of the presence of the identified virus, but do not rule out bacterial infection or co-infection with other pathogens not detected by the test. Clinical correlation with patient history and other diagnostic information is necessary to determine patient infection status. The expected result is Negative.  Fact Sheet for Patients: EntrepreneurPulse.com.au  Fact Sheet for Healthcare Providers: IncredibleEmployment.be  This test is not yet approved or cleared by the [place] FDA and  has been authorized for detection and/or diagnosis of SARS-CoV-2 by FDA under an Emergency Use Authorization (EUA).  This EUA will remain in effect (meaning this t est can be used) for the duration of  the COVID-19 declaration under Section 564(b)(1) of the Act, 21 U.S.C. section 360bbb-3(b)(1), unless the authorization is terminated or revoked sooner.     Influenza A by PCR NEGATIVE NEGATIVE Final   Influenza B by PCR NEGATIVE NEGATIVE Final    Comment: (NOTE) The Xpert Xpress SARS-CoV-2/FLU/RSV plus assay is intended as an aid in the diagnosis of influenza from Nasopharyngeal swab specimens and should not be used as a sole basis for treatment. Nasal washings and aspirates are unacceptable for Xpert Xpress SARS-CoV-2/FLU/RSV testing.  Fact Sheet for Patients: EntrepreneurPulse.com.au  Fact Sheet for Healthcare Providers: IncredibleEmployment.be  This test is not yet approved or cleared by the [place] FDA and has been authorized for detection and/or diagnosis of SARS-CoV-2 by FDA under an Emergency Use Authorization (EUA). This EUA  will remain in effect (meaning this test can be used) for the duration of the COVID-19 declaration under Section 564(b)(1) of the Act, 21 U.S.C. section 360bbb-3(b)(1), unless the authorization is terminated or revoked.  Performed at [facility], [place] [address], [place], [place] [address]   Urine culture     Status: Abnormal   Collection Time: 06/21/2020  3:37 AM   Specimen: Urine, Clean Catch  Result Value Ref Range Status   Specimen Description URINE, CLEAN CATCH  Final   Special Requests   Final    NONE Performed at [facility], [place] [address] [place], [place] [address]  Culture (A)  Final    80,000 COLONIES/mL STAPHYLOCOCCUS EPIDERMIDIS 50,000 COLONIES/mL ENTEROCOCCUS FAECALIS    Report Status 06/14/2020 FINAL  Final   Organism ID, Bacteria STAPHYLOCOCCUS EPIDERMIDIS (A)  Final   Organism ID, Bacteria ENTEROCOCCUS FAECALIS (A)  Final      Susceptibility   Enterococcus faecalis - MIC*    AMPICILLIN <=2 SENSITIVE Sensitive     NITROFURANTOIN <=16 SENSITIVE Sensitive     VANCOMYCIN 1 SENSITIVE Sensitive     * 50,000 COLONIES/mL ENTEROCOCCUS FAECALIS   Staphylococcus epidermidis - MIC*    CIPROFLOXACIN >=8 RESISTANT Resistant     GENTAMICIN <=0.5 SENSITIVE Sensitive     NITROFURANTOIN <=16 SENSITIVE Sensitive     OXACILLIN >=4 RESISTANT Resistant     TETRACYCLINE >=16 RESISTANT Resistant     VANCOMYCIN 1 SENSITIVE Sensitive     TRIMETH/SULFA 80 RESISTANT Resistant     CLINDAMYCIN <=0.25 SENSITIVE Sensitive     RIFAMPIN <=0.5 SENSITIVE Sensitive     Inducible Clindamycin NEGATIVE Sensitive     * 80,000 COLONIES/mL STAPHYLOCOCCUS EPIDERMIDIS  Blood culture (routine single)     Status: None (Preliminary result)   Collection Time: 06/21/2020  3:40 AM   Specimen: BLOOD RIGHT ARM  Result Value Ref Range Status   Specimen Description BLOOD RIGHT ARM  Final   Special Requests   Final    BOTTLES DRAWN AEROBIC AND ANAEROBIC Blood Culture results may not be optimal  due to an inadequate volume of blood received in culture bottles   Culture   Final    NO GROWTH 4 DAYS Performed at [facility], [address] [place], [place] [address]    Report Status PENDING  Incomplete  Respiratory (~20 pathogens) panel by PCR     Status: None   Collection Time: 06/13/20  4:09 PM   Specimen: Nasopharyngeal Swab; Respiratory  Result Value Ref Range Status   Adenovirus NOT DETECTED NOT DETECTED Final   Coronavirus 229E NOT DETECTED NOT DETECTED Final    Comment: (NOTE) The Coronavirus on the Respiratory Panel, DOES NOT test for the novel  Coronavirus (2019 nCoV)    Coronavirus HKU1 NOT DETECTED NOT DETECTED Final   Coronavirus NL63 NOT DETECTED NOT DETECTED Final   Coronavirus OC43 NOT DETECTED NOT DETECTED Final   Metapneumovirus NOT DETECTED NOT DETECTED Final   Rhinovirus / Enterovirus NOT DETECTED NOT DETECTED Final   Influenza A NOT DETECTED NOT DETECTED Final   Influenza B NOT DETECTED NOT DETECTED Final   Parainfluenza Virus 1 NOT DETECTED NOT DETECTED Final   Parainfluenza Virus 2 NOT DETECTED NOT DETECTED Final   Parainfluenza Virus 3 NOT DETECTED NOT DETECTED Final   Parainfluenza Virus 4 NOT DETECTED NOT DETECTED Final   Respiratory Syncytial Virus NOT DETECTED NOT DETECTED Final   Bordetella pertussis NOT DETECTED NOT DETECTED Final   Bordetella Parapertussis NOT DETECTED NOT DETECTED Final   Chlamydophila pneumoniae NOT DETECTED NOT DETECTED Final   Mycoplasma pneumoniae NOT DETECTED NOT DETECTED Final    Comment: Performed at [facility], [place], [place], [place] [address]    Radiology Reports CT ANGIO CHEST PE W OR WO CONTRAST  Result Date: 06/29/2020 CLINICAL DATA:  I more foci of EXAM: CT ANGIOGRAPHY CHEST WITH CONTRAST TECHNIQUE: Multidetector CT imaging of the chest was performed using the standard protocol during bolus administration of intravenous contrast. Multiplanar CT image reconstructions and MIPs were obtained  to evaluate the vascular anatomy. CONTRAST:  58mL OMNIPAQUE IOHEXOL 350 MG/ML  SOLN COMPARISON:  CT angiography 05/14/2020, CT angiography 03/26/2020, CT angiography 11/27/2010 FINDINGS: Cardiovascular: Satisfactory opacification of the pulmonary arteries to the segmental level. Filling defect in the right lower lobe segmental pulmonary artery (5:52). No central pulmonary embolus. No definite left pulmonary embolus. Left ventricular hypertrophy. No pericardial effusion. Four-vessel coronary artery calcification. Mediastinum/Nodes: No enlarged mediastinal, hilar, or axillary lymph nodes. Thyroid gland, trachea, and esophagus demonstrate no significant findings. Lungs/Pleura: Redemonstration of cystic changes that are more apparent within the upper lobes likely related to emphysematous changes. Interlobular septal wall thickening. Redemonstration of peribronchovascular ground-glass airspace opacities and consolidation with associated bronchiectasis. No pleural effusion. No pneumothorax. Upper Abdomen: No acute abnormality. Musculoskeletal: No abdominal wall hernia or abnormality. No suspicious lytic or blastic osseous lesions. No acute displaced fracture. Multilevel degenerative changes of the spine. Review of the MIP images confirms the above findings. IMPRESSION: 1. Residual segmental right lower lobe pulmonary embolus with interval resolution of the central pulmonary embolus component. No associated heart strain. No definite pulmonary infarction. 2. Diffuse pulmonary findings similar to at least as far back as 03/16/2020. Findings could be infectious/inflammatory. Underlying malignancy not excluded. 3. Left ventricular hypertrophy. 4. Aortic Atherosclerosis (ICD10-I70.0) and Emphysema (ICD10-J43.9). 5. Four-vessel coronary artery calcifications. These results were called by telephone at the time of interpretation on 06/07/2020 at 5:36 am to provider [facility] , who verbally acknowledged these results.  Electronically Signed   By: Iven Finn M.D.   On: 06/07/2020 05:41   CT Angio Chest PE W and/or Wo Contrast  Result Date: 05/25/2020 CLINICAL DATA:  75 year old male with shortness of breath and chest pain. Concern for pulmonary embolism. EXAM: CT ANGIOGRAPHY CHEST WITH CONTRAST TECHNIQUE: Multidetector CT imaging of the chest was performed using the standard protocol during bolus administration of intravenous contrast. Multiplanar CT image reconstructions and MIPs were obtained to evaluate the vascular anatomy. CONTRAST:  36mL OMNIPAQUE IOHEXOL 350 MG/ML SOLN COMPARISON:  Chest CT dated 05/14/2020. FINDINGS: Cardiovascular: Mild cardiomegaly. No pericardial effusion. There is coronary vascular calcification. Mild atherosclerotic calcification of the thoracic aorta. Linear nonocclusive thrombus noted extending from the central right pulmonary artery into the lobar and segmental branches of the right lower lobe and right upper lobe. This is new since the prior CT. No CT evidence of right heart straining. Mediastinum/Nodes: Top-normal right hilar lymph nodes. The esophagus and the thyroid gland are grossly unremarkable. No mediastinal fluid collection. Lungs/Pleura: Bilateral patchy and streaky densities similar to prior CT. Calcified pleural plaque noted in the left upper lobe anteriorly. No pleural effusion pneumothorax. The central airways are patent. Upper Abdomen: No acute abnormality. Musculoskeletal: No chest wall abnormality. No acute or significant osseous findings. Review of the MIP images confirms the above findings. IMPRESSION: 1. Pulmonary artery embolus extending from the central right pulmonary artery into the lobar and segmental branches of the right lower lobe and right upper lobe, new since the prior CT. No CT evidence of right heart straining. 2. Bilateral patchy and streaky densities similar to prior CT. 3. Aortic Atherosclerosis (ICD10-I70.0). These results were called by telephone at the  time of interpretation on 05/25/2020 at 6:39 pm to provider DAVID YAO , who verbally acknowledged these results. Electronically Signed   By: Anner Crete M.D.   On: 05/25/2020 18:43   MR CERVICAL SPINE W WO CONTRAST  Result Date: 05/17/2020 CLINICAL DATA:  Initial screening for possible epidural abscess. EXAM: MRI CERVICAL SPINE WITHOUT AND WITH CONTRAST TECHNIQUE: Multiplanar and multiecho pulse sequences of the cervical spine, to  include the craniocervical junction and cervicothoracic junction, were obtained without and with intravenous contrast. CONTRAST:  56mL GADAVIST GADOBUTROL 1 MMOL/ML IV SOLN COMPARISON:  Prior MRI from 03/24/2010. FINDINGS: Alignment: Reversal of the normal cervical lordosis with apex at C3-4. Trace anterolisthesis of C3 on C4 and C7 on T1, chronic and facet mediated. Vertebrae: Vertebral body height maintained without acute or chronic fracture. C4 and C5 vertebral bodies are largely ankylosed, likely degenerative. Bone marrow signal intensity within normal limits. No discrete or worrisome osseous lesions. No abnormal marrow edema or enhancement. No findings to suggest osteomyelitis discitis or septic arthritis. Cord: Normal signal and morphology. No epidural abscess or other collection. Posterior Fossa, vertebral arteries, paraspinal tissues: Probable benign retro cerebellar arachnoid cyst noted. Visualized brain and posterior fossa otherwise unremarkable. Craniocervical junction within normal limits. Paraspinous and prevertebral soft tissues normal. Normal flow voids seen within the vertebral arteries bilaterally. Disc levels: C2-C3: Negative interspace. Right worse than left facet hypertrophy. No spinal stenosis. Mild right C3 foraminal narrowing. Left neural foramina remains patent. C3-C4: Broad-based posterior disc osteophyte flattens and effaces the ventral thecal sac. Mild flattening of the ventral cord without cord signal changes. Mild spinal stenosis. Superimposed bilateral  facet hypertrophy with left greater than right uncovertebral spurring. Severe left with moderate right C4 foraminal stenosis. C4-C5: C4 and C5 vertebral bodies are largely ankylosed. Broad posterior endplate osseous ridging flattens and effaces the ventral thecal sac. Mild spinal stenosis without cord impingement. Moderate right worse than left C5 foraminal stenosis. C5-C6: Degenerative intervertebral disc space narrowing with diffuse disc osteophyte complex. Mild flattening of the ventral thecal sac without significant spinal stenosis. Moderate left worse than right C6 foraminal narrowing. C6-C7: Degenerative intervertebral disc space narrowing with diffuse disc osteophyte complex. No significant spinal stenosis. Severe left with moderate right C7 foraminal stenosis. C7-T1: Trace anterolisthesis. No significant disc bulge. Moderate bilateral facet and ligament flavum hypertrophy. No significant spinal stenosis. Foramina remain patent. IMPRESSION: 1. No evidence for osteomyelitis discitis or septic arthritis within the cervical spine. No epidural abscess or other infection. 2. Multilevel cervical spondylosis with resultant mild spinal stenosis at C3-4 and C4-5. 3. Multifactorial degenerative changes with resultant multilevel foraminal narrowing as above. Notable findings include severe left with moderate right C4 foraminal stenosis, moderate bilateral C5 and C6 foraminal narrowing, with severe left and moderate right C7 foraminal stenosis. Electronically Signed   By: Jeannine Boga M.D.   On: 05/17/2020 21:40   MR THORACIC SPINE W WO CONTRAST  Result Date: 05/17/2020 CLINICAL DATA:  Initial screening for possible infection, epidural abscess. EXAM: MRI THORACIC WITHOUT AND WITH CONTRAST TECHNIQUE: Multiplanar and multiecho pulse sequences of the thoracic spine were obtained without and with intravenous contrast. CONTRAST:  82mL GADAVIST GADOBUTROL 1 MMOL/ML IV SOLN COMPARISON:  None available. FINDINGS:  Alignment: Straightening of the normal thoracic kyphosis. No listhesis. Vertebrae: Vertebral body height maintained without acute or chronic fracture. Bone marrow signal intensity within normal limits. Few scattered benign hemangiomata noted. No worrisome osseous lesions. No abnormal marrow edema or enhancement. No findings to suggest osteomyelitis discitis or septic arthritis. Cord: Normal signal and morphology. No epidural abscess or other collection. No abnormal enhancement. Paraspinal and other soft tissues: Paraspinous soft tissues demonstrate no acute finding. Small layering bilateral pleural effusions noted, left slightly larger than right. T2 hyperintense benign appearing cyst partially visualized extending from the right kidney. Visualized visceral structures otherwise grossly unremarkable. Disc levels: T1-2:  Unremarkable. T2-3: Tiny right paracentral disc extrusion with superior migration. Mild right-sided facet  hypertrophy. No spinal stenosis. Foramina remain patent. T3-4: Minimal disc bulge. No spinal stenosis. Foramina remain patent. T4-5: Normal interspace. Mild right-sided facet hypertrophy. No stenosis. T5-6:  Normal interspace.  Mild facet hypertrophy.  No stenosis. T6-7:  Unremarkable. T7-8: Mild degenerative intervertebral disc space narrowing with disc bulge. No spinal stenosis. Foramina remain patent. T8-9: Mild disc bulge. No spinal stenosis. Foramina remain patent. T9-10: Mild disc bulge with posterior element hypertrophy. No significant spinal stenosis. Mild bilateral foraminal narrowing. T10-11: Diffuse disc bulge, asymmetric to the right. Moderate facet and ligament flavum hypertrophy. Resultant mild to moderate spinal stenosis with mild cord flattening. No cord signal changes. Moderate bilateral foraminal narrowing. T11-12: Negative interspace. Bilateral facet hypertrophy. No significant spinal stenosis. Mild bilateral foraminal narrowing. T12-L1: Disc desiccation with small endplate  Schmorl's node deformities. No stenosis. IMPRESSION: 1. No MRI evidence for osteomyelitis discitis or septic arthritis within the thoracic spine. No epidural abscess or other infection. 2. Disc bulging with facet hypertrophy at T10-11 with resultant mild to moderate spinal stenosis and moderate bilateral foraminal narrowing. 3. Additional mild spondylosis elsewhere within the thoracic spine without significant stenosis. 4. Small layering bilateral pleural effusions, left slightly larger than right. Electronically Signed   By: Jeannine Boga M.D.   On: 05/17/2020 21:52   DG CHEST PORT 1 VIEW  Result Date: 06/13/2020 CLINICAL DATA:  Pneumonia due to COVID-19 virus.  Fever. EXAM: PORTABLE CHEST 1 VIEW COMPARISON:  1 day prior FINDINGS: Midline trachea. Normal heart size for level of inspiration. No pleural effusion or pneumothorax. Worsened, right greater than left interstitial opacities. Relatively diffuse, with more normal aerated left upper lung. IMPRESSION: Worsened COVID-19 pneumonia. Electronically Signed   By: Abigail Miyamoto M.D.   On: 06/13/2020 08:17   DG Chest Port 1 View  Result Date: 06/11/2020 CLINICAL DATA:  Sepsis EXAM: PORTABLE CHEST 1 VIEW COMPARISON:  05/30/2020 FINDINGS: Bilateral interstitial opacities, unchanged. Normal cardiomediastinal contours. No pneumothorax or sizable pleural effusion. IMPRESSION: Unchanged bilateral interstitial opacities. Electronically Signed   By: Ulyses Jarred M.D.   On: 06/30/2020 03:48   DG Chest Port 1 View  Result Date: 05/30/2020 CLINICAL DATA:  Difficulty breathing, low O2 sats, history of COVID infection. EXAM: PORTABLE CHEST 1 VIEW COMPARISON:  05/29/2020 and CT chest 05/25/2020. FINDINGS: Heart size stable. Patchy mixed interstitial and airspace opacification bilaterally, increased from 05/29/2020. No pleural fluid. IMPRESSION: Worsening COVID-19 pneumonia. Electronically Signed   By: Lorin Picket M.D.   On: 05/30/2020 09:11   DG Chest Port 1  View  Result Date: 05/29/2020 CLINICAL DATA:  Shortness of breath.  Covid-19 viral infection. EXAM: PORTABLE CHEST 1 VIEW COMPARISON:  05/28/2020 FINDINGS: Heart size remains within normal limits. Diffuse interstitial infiltrates are again seen with mild patchy airspace opacities in the left lung base. These show no significant change since previous study. No evidence of pleural effusion. IMPRESSION: No significant change in diffuse interstitial infiltrates with mild patchy airspace disease in left lower lung. Electronically Signed   By: Marlaine Hind M.D.   On: 05/29/2020 08:36   DG Chest Port 1 View  Result Date: 05/28/2020 CLINICAL DATA:  Shortness of breath EXAM: PORTABLE CHEST 1 VIEW COMPARISON:  Three days ago FINDINGS: Patchy bilateral pulmonary infiltrate without convincing progression. Lung volumes are low. No visible effusion or air leak. Normal heart size and stable mediastinal contours. IMPRESSION: Similar degree of bilateral pulmonary infiltrates. No new abnormality. Electronically Signed   By: Monte Fantasia M.D.   On: 05/28/2020 07:38   DG  Chest Port 1 View  Result Date: 05/25/2020 CLINICAL DATA:  75 year old male with concern for sepsis. EXAM: PORTABLE CHEST 1 VIEW COMPARISON:  Chest CT dated 05/14/2020. FINDINGS: Diffuse bilateral streaky interstitial densities worsened since 05/09/2020 but relatively similar to 05/10/2020 concerning for residual infiltrate, likely on the background of chronic changes. Clinical correlation and follow-up recommended. No lobar consolidation, pleural effusion, pneumothorax. The cardiac silhouette is within limits. No acute osseous pathology. IMPRESSION: Persistent bilateral infiltrates. Electronically Signed   By: Anner Crete M.D.   On: 05/25/2020 16:24   ECHOCARDIOGRAM COMPLETE  Result Date: 05/26/2020    ECHOCARDIOGRAM REPORT   Patient Name:   ABDELKARIM HEFFINGTON Date of Exam: 05/26/2020 Medical Rec #:  [id number]      Height:       74.0 in Accession #:     [id number]     Weight:       194.0 lb Date of Birth:  1945-11-17      BSA:          2.146 m Patient Age:    25 years       BP:           129/92 mmHg Patient Gender: M              HR:           84 bpm. Exam Location:  Inpatient Procedure: 2D Echo, Cardiac Doppler, Color Doppler and Intracardiac            Opacification Agent Indications:    Pulmonary Embolus I26.09  History:        Patient has prior history of Echocardiogram examinations, most                 recent 04/03/2020. CAD and Previous Myocardial Infarction, COPD,                 Signs/Symptoms:Shortness of Breath; Risk Factors:Hypertension                 and Dyslipidemia. COVID 19. Sepsis, due to unspecified organism,                 unspecified whether acute organ dysfunction present                 Multiple subsegmental pulmonary emboli without acute cor                 pulmonale.  Sonographer:    Darlina Sicilian RDCS Referring Phys: [id number] [place]  1. Left ventricular ejection fraction, by estimation, is 50 to 55%. The left ventricle has low normal function. The left ventricle has no regional wall motion abnormalities. There is severe asymmetric left ventricular hypertrophy of the basal and septal  segments. Left ventricular diastolic parameters are consistent with Grade II diastolic dysfunction (pseudonormalization). Elevated left ventricular end-diastolic pressure.  2. Right ventricular systolic function is normal. The right ventricular size is normal.  3. The mitral valve is abnormal. No evidence of mitral valve regurgitation. No evidence of mitral stenosis.  4. The aortic valve is tricuspid. There is mild calcification of the aortic valve. Aortic valve regurgitation is not visualized. Mild aortic valve sclerosis is present, with no evidence of aortic valve stenosis.  5. Aortic dilatation noted. There is mild dilatation of the aortic root, measuring 41 mm.  6. The inferior vena cava is normal in size with greater than 50%  respiratory variability, suggesting right atrial pressure of 3 mmHg. FINDINGS  Left Ventricle: Left  ventricular ejection fraction, by estimation, is 50 to 55%. The left ventricle has low normal function. The left ventricle has no regional wall motion abnormalities. Definity contrast agent was given IV to delineate the left ventricular endocardial borders. The left ventricular internal cavity size was normal in size. There is severe asymmetric left ventricular hypertrophy of the basal and septal segments. Left ventricular diastolic parameters are consistent with Grade II diastolic dysfunction (pseudonormalization). Elevated left ventricular end-diastolic pressure. Right Ventricle: The right ventricular size is normal. No increase in right ventricular wall thickness. Right ventricular systolic function is normal. Left Atrium: Left atrial size was normal in size. Right Atrium: Right atrial size was normal in size. Pericardium: There is no evidence of pericardial effusion. Mitral Valve: The mitral valve is abnormal. There is mild thickening of the mitral valve leaflet(s). There is mild calcification of the mitral valve leaflet(s). Mild mitral annular calcification. No evidence of mitral valve regurgitation. No evidence of mitral valve stenosis. Tricuspid Valve: The tricuspid valve is normal in structure. Tricuspid valve regurgitation is not demonstrated. No evidence of tricuspid stenosis. Aortic Valve: The aortic valve is tricuspid. There is mild calcification of the aortic valve. Aortic valve regurgitation is not visualized. Mild aortic valve sclerosis is present, with no evidence of aortic valve stenosis. Pulmonic Valve: The pulmonic valve was normal in structure. Pulmonic valve regurgitation is not visualized. No evidence of pulmonic stenosis. Aorta: The aortic root is normal in size and structure and aortic dilatation noted. There is mild dilatation of the aortic root, measuring 41 mm. Venous: The inferior vena cava  is normal in size with greater than 50% respiratory variability, suggesting right atrial pressure of 3 mmHg. IAS/Shunts: No atrial level shunt detected by color flow Doppler.  LEFT VENTRICLE PLAX 2D LVIDd:         3.20 cm     Diastology LVIDs:         2.60 cm     LV e' medial:    3.15 cm/s LV PW:         1.40 cm     LV E/e' medial:  17.1 LV IVS:        2.00 cm     LV e' lateral:   3.37 cm/s LVOT diam:     2.00 cm     LV E/e' lateral: 16.0 LV SV:         41 LV SV Index:   19 LVOT Area:     3.14 cm  LV Volumes (MOD) LV vol d, MOD A2C: 91.2 ml LV vol d, MOD A4C: 78.4 ml LV vol s, MOD A2C: 43.8 ml LV vol s, MOD A4C: 36.8 ml LV SV MOD A2C:     47.4 ml LV SV MOD A4C:     78.4 ml LV SV MOD BP:      44.4 ml RIGHT VENTRICLE RV S prime:     8.16 cm/s LEFT ATRIUM             Index       RIGHT ATRIUM          Index LA diam:        3.00 cm 1.40 cm/m  RA Area:     6.38 cm LA Vol (A2C):   20.1 ml 9.37 ml/m  RA Volume:   7.69 ml  3.58 ml/m LA Vol (A4C):   21.3 ml 9.93 ml/m LA Biplane Vol: 21.6 ml 10.07 ml/m  AORTIC VALVE LVOT Vmax:   80.50 cm/s LVOT  Vmean:  60.800 cm/s LVOT VTI:    0.130 m  AORTA Ao Root diam: 4.10 cm Ao Asc diam:  3.40 cm MITRAL VALVE MV Area (PHT): 3.01 cm    SHUNTS MV Decel Time: 252 msec    Systemic VTI:  0.13 m MV E velocity: 53.80 cm/s  Systemic Diam: 2.00 cm MV A velocity: 81.50 cm/s MV E/A ratio:  0.66 Jenkins Rouge MD Electronically signed by Jenkins Rouge MD Signature Date/Time: 05/26/2020/2:30:51 PM    Final    VAS [place] LOWER EXTREMITY VENOUS (DVT)  Result Date: 05/26/2020  Lower Venous DVT Study Indications: Edema, and Swelling.  Comparison Study: 05/11/20 previous Performing Technologist: Abram Sander RVS  Examination Guidelines: A complete evaluation includes B-mode imaging, spectral Doppler, color Doppler, and power Doppler as needed of all accessible portions of each vessel. Bilateral testing is considered an integral part of a complete examination. Limited examinations for reoccurring  indications may be performed as noted. The reflux portion of the exam is performed with the patient in reverse Trendelenburg.  +---------+---------------+---------+-----------+----------+--------------+ RIGHT    CompressibilityPhasicitySpontaneityPropertiesThrombus Aging +---------+---------------+---------+-----------+----------+--------------+ CFV      Full           Yes      Yes                                 +---------+---------------+---------+-----------+----------+--------------+ SFJ      Full                                                        +---------+---------------+---------+-----------+----------+--------------+ FV Prox  Full                                                        +---------+---------------+---------+-----------+----------+--------------+ FV Mid   Full                                                        +---------+---------------+---------+-----------+----------+--------------+ FV DistalFull                                                        +---------+---------------+---------+-----------+----------+--------------+ PFV      Full                                                        +---------+---------------+---------+-----------+----------+--------------+ POP      Full           Yes      Yes                                 +---------+---------------+---------+-----------+----------+--------------+  PTV      Full                                                        +---------+---------------+---------+-----------+----------+--------------+ PERO     Full                                                        +---------+---------------+---------+-----------+----------+--------------+   +---------+---------------+---------+-----------+----------+-----------------+ LEFT     CompressibilityPhasicitySpontaneityPropertiesThrombus Aging     +---------+---------------+---------+-----------+----------+-----------------+ CFV      Full           Yes      Yes                                    +---------+---------------+---------+-----------+----------+-----------------+ SFJ      Full                                                           +---------+---------------+---------+-----------+----------+-----------------+ FV Prox  Full                                                           +---------+---------------+---------+-----------+----------+-----------------+ FV Mid   Full                                                           +---------+---------------+---------+-----------+----------+-----------------+ FV DistalFull                                                           +---------+---------------+---------+-----------+----------+-----------------+ PFV      Full                                                           +---------+---------------+---------+-----------+----------+-----------------+ POP      None           No       No                   Age Indeterminate +---------+---------------+---------+-----------+----------+-----------------+ PTV      None  Age Indeterminate +---------+---------------+---------+-----------+----------+-----------------+ PERO     None                                         Age Indeterminate +---------+---------------+---------+-----------+----------+-----------------+     Summary: RIGHT: - There is no evidence of deep vein thrombosis in the lower extremity.  - No cystic structure found in the popliteal fossa.  LEFT: - Findings consistent with age indeterminate deep vein thrombosis involving the left popliteal vein, left posterior tibial veins, and left peroneal veins. - No cystic structure found in the popliteal fossa.  *See table(s) above for measurements and observations. Electronically signed by Harold Barban MD on 05/26/2020 at 9:28:41 PM.    Final

## 2020-06-17 DIAGNOSIS — Z515 Encounter for palliative care: Secondary | ICD-10-CM | POA: Diagnosis not present

## 2020-06-17 DIAGNOSIS — J9601 Acute respiratory failure with hypoxia: Secondary | ICD-10-CM | POA: Diagnosis not present

## 2020-06-17 DIAGNOSIS — U071 COVID-19: Secondary | ICD-10-CM | POA: Diagnosis not present

## 2020-06-17 DIAGNOSIS — J439 Emphysema, unspecified: Secondary | ICD-10-CM | POA: Diagnosis not present

## 2020-06-17 LAB — CULTURE, BLOOD (SINGLE): Culture: NO GROWTH

## 2020-06-17 MED ORDER — HALOPERIDOL 0.5 MG PO TABS
0.5000 mg | ORAL_TABLET | ORAL | Status: DC | PRN
  Filled 2020-06-17: qty 1

## 2020-06-17 MED ORDER — HALOPERIDOL LACTATE 2 MG/ML PO CONC
0.5000 mg | ORAL | Status: DC | PRN
  Filled 2020-06-17: qty 0.3

## 2020-06-17 MED ORDER — GLYCOPYRROLATE 0.2 MG/ML IJ SOLN: 0.2000 mg | INTRAMUSCULAR | Status: DC | PRN

## 2020-06-17 MED ORDER — SODIUM CHLORIDE 0.9 % IV SOLN
1.0000 mg/h | INTRAVENOUS | Status: DC
  Administered 2020-06-17: 1 mg/h via INTRAVENOUS
  Filled 2020-06-17 (×2): qty 2.5

## 2020-06-17 MED ORDER — HALOPERIDOL LACTATE 5 MG/ML IJ SOLN
2.0000 mg | INTRAMUSCULAR | Status: DC | PRN
  Filled 2020-06-17: qty 1

## 2020-06-17 MED ORDER — GLYCOPYRROLATE 0.2 MG/ML IJ SOLN: 0.4000 mg | INTRAMUSCULAR | Status: DC | PRN

## 2020-06-17 MED ORDER — GLYCOPYRROLATE 1 MG PO TABS
1.0000 mg | ORAL_TABLET | ORAL | Status: DC | PRN
  Filled 2020-06-17: qty 1

## 2020-06-17 MED ORDER — HYDROMORPHONE BOLUS VIA INFUSION
1.0000 mg | INTRAVENOUS | Status: DC | PRN
Start: 2020-06-17 — End: 2020-06-18
  Filled 2020-06-17: qty 1

## 2020-06-17 MED ORDER — HALOPERIDOL LACTATE 5 MG/ML IJ SOLN
1.0000 mg | Freq: Two times a day (BID) | INTRAMUSCULAR | Status: DC
  Administered 2020-06-17: 1 mg via INTRAVENOUS

## 2020-06-17 NOTE — Progress Notes (Signed)
Daily Progress Note   Patient Name: Tyrone Roman       Date: 06/17/2020 DOB: [birth year]  Age: 75 y.o. MRN#: [id number] Attending Physician: Jonetta Osgood, MD Primary Care Physician: [name], MD Admit Date: Jul 05, 2020  Reason for Consultation/Follow-up: To discuss complex medical decision making related to patient's goals of care  Discussed with Dr. Sloan Leiter. Patient with increased work of breathing on 40L HFNC and NRM at full force for approximately 55L of oxygen support.  He is tachy at 140.  Extremities jump when patient is trying to breath.   Subjective:  Daughter, grand children and very close neighbors at bedside.  Per [name] at bedside patient has not been awake and talking since yesterday late afternoon.  He has always been opposed to opioid pain medication but this morning was agreeable to it.     We discussed how hard his body is working.  [name] asked what does it mean that he is not making urine any longer.  I explained that it often means we are in the last 24 - 48 hours of life.  I provided her with a "Gone from my Sight booklet".    We dicussed initiating a continuous infusion for his comfort.  He is no longer waking but his body is struggling very hard.  [name] is agreeable.      Assessment: Patient actively dying.  Does not appear comfortable.  Rapid heart rate, rapid breathing.  No longer waking.  Minimal urine output.   Patient Profile/HPI:  75 y.o. male  with past medical history of rheumatoid arthritis, CAD s/p PCI, hypertension, hyperlipidemia, COPD, BPH, COVID infection since Jan 2022 admitted on 07/05/2020 with shortness of breath with ongoing COVID infection. Unfortunately despite being properly vaccinated and having multiple treatments for COVID  infection his body has not been able to produce antibodies to fight infection. He continues to require multiple hospitalizations and requiring HFNC and interested in comfort care at this time.   Length of Stay: 5   Vital Signs: BP 116/85 (BP Location: Right Arm)   Pulse 71   Temp 98.1 F (36.7 C) (Oral)   Resp (!) 25   SpO2 98%  SpO2: SpO2: 98 % O2 Device: O2 Device: High Flow Nasal Cannula,NRB O2 Flow Rate: O2 Flow Rate (L/min): 40 L/min  Palliative Assessment/Data: 10%     Palliative Care Plan    Recommendations/Plan: Greatly appreciate Chaplain support from Sallyanne Kuster. Family realizes prognosis is likely 24 - 48 hours Continuous infusion started for dyspnea Scheduled Haldol for anxiety and agitation as patient has ativan allergy. Would not be too concerned about reducing the oxygen until the patient is completely comfortable. No escalation of care or oxygen supplementation.  Code Status:  DNR  Prognosis:  Hours - Days   Discharge Planning: Anticipated Hospital Death  Care plan was discussed with [name], RN  Thank you for allowing the Palliative Medicine Team to assist in the care of this patient.  Total time spent:       Greater than 50%  of this time was spent counseling and coordinating care related to the above assessment and plan.  Florentina Jenny, PA-C Palliative Medicine  Please contact Palliative MedicineTeam phone at [phone] for questions and concerns between 7 am - 7 pm.   Please see AMION for individual provider pager numbers.

## 2020-06-17 NOTE — Progress Notes (Signed)
   I have reached out and been able to have conversation with the pt's daughter [name]-. I have explained hospice services at home and how we can help with her dad's care. She is in agreement for him to go home if we are able to meet his needs at home. As of this time his oxygen demands aret at a rate that we can not accommodate at home. The maximum amt of oxygen we can do in home if 15L. She has asked me to hold off on ordering or changing out his equipment at home for now to see if he can be weaned and if it appears we will be able to accomplish this then she will allow [place] to switch the oxygen out in her home. He has a Buyer, retail and we will need to order 2 10L liter concentrators. I will touch back with her around lunch and see how he is doing and revisit the equipment ordering.   Webb Silversmith RN BSN [facility] [phone]

## 2020-06-17 NOTE — Progress Notes (Signed)
PROGRESS NOTE                                                                                                                                                                                                             Patient Demographics:    Tyrone Roman, is a 75 y.o. male, DOB - 1945-04-22, YQI:[id number]  Outpatient Primary MD for the patient is [name], MD   Admit date - Jun 27, 2020   LOS - 5  Chief Complaint  Patient presents with  . Shortness of Breath       Brief Narrative: Patient is a 75 y.o. male with PMHx of RA, COPD, CAD s/p PCI, HTN, HLD-who had COVID-19 infection in January 2021-since then he has had numerous hospitalizations for recurrent pneumonia/fever-ultimately felt to be due to persistent/acute COVID-19 infection in a setting of severe immunocompromised state (s/p Rituxan infusion-COVID IgG antibody negative inspite of vaccination/natural infection).  His most recent hospitalization was from 4/20-4/28-during this hospitalization-he presented with worsening respiratory failure-and responded to IV steroids and monoclonal antibody infusion-he was discharged home on his usual regimen of 2-3 L.  He again presented to the hospital on 5/8 with worsening hypoxemia-failed to be due to ongoing COVID-19 pneumonitis-given monoclonal antibody, high titer convalescent plasma, IV steroids, IV remdesivir without any significant clinical improvement.  He remains severely hypoxemic on heated high flow and NRB.  COVID-19 vaccinated status: Vaccinated including booster  Significant Events: 5/8>> Admit to [facility] for acute on chronic hypoxia-due to COVID-19 pneumonitis. 5/12>> transitioned to comfort measures.  Significant studies: 5/8>> CTA chest: Residual PE in right lower lobe-diffuse pulmonary infiltrates.  COVID-19 medications: IV steroids: 5/8>> Remdesivir: 5/8>> COVID convalescent plasma: 5/9 x 1 COVID  convalescent plasma: 5/10 x 1 Monoclonal antibody: 5/10 x 1  Antibiotics: None  Microbiology data: 5/8 >> urine culture: Staph epidermidis/Enterococcus faecalis  5/8 >>blood culture: No growth 5/9>> respiratory virus panel: Negative  Procedures: None  Consults: ID, PCCM  DVT prophylaxis: Not needed-comfort measures.    Subjective:   Was sleeping comfortably when I saw him-he awoke-acknowledge me-denied any complaints.  Later in the morning-he woke up and was briefly agitated.  Per nursing staff-refused scheduled oral Roxanol.   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  Profoundly symptomatic with minimal movement-including removing the NRB to eat.  After extensive discussion with family by this MD-and also by the palliative care team-transitioned to full comfort measures on 5/12.  I have discontinued Remdesivir-I have placed him on some prednisone to prevent acute adrenal crisis and rapid deterioration-patient is still awake/alert-and wants to spend some time with family/friends.  If he worsens-and requires more symptom control-and starts deteriorating-we can then contemplate stopping steroids altogether at that point  O2 requirements:  SpO2: 98 % O2 Flow Rate (L/min): 40 L/min FiO2 (%): 100 %   COVID-19 Labs: Recent Labs    06/15/20 0126 06/16/20 0106  DDIMER 1.32* 1.19*  FERRITIN 2,283* 2,331*  CRP 23.9* 18.8*       Component Value Date/Time   BNP 294.8 (H) 06/01/2020 0250    Recent Labs  Lab 06/20/2020 1014 06/14/20 0202  PROCALCITON <0.10 0.77    Lab Results  Component Value Date   SARSCOV2NAA POSITIVE (A) 06/21/2020   SARSCOV2NAA POSITIVE (A) 05/25/2020   SARSCOV2NAA POSITIVE (A) 05/09/2020   SARSCOV2NAA POSITIVE (A) 03/25/2020      Pulmonary embolism: No  longer on anticoagulation-as  transitioned to full comfort measures.  Oral thrush: Prior sputum culture positive for Candida glabrata-he was on Eraxis-this was discontinued on 5/12.    CAD: No anginal symptoms-no role for aspirin.  Rheumatoid arthritis: On Rituxan previously-symptoms stable on steroids.  See above regarding steroids.  COPD: Stable-not in exacerbation-continue bronchodilators for comfort.  BPH: Continue Flomax-keep Foley in place for comfort.  HTN: Stable-stop Lopressor.  HLD: Stop Zocor  DM-2: Stop all CBG checks-stop insulin administration-on full comfort measures.  Recent Labs    06/16/20 0127 06/16/20 0403 06/16/20 0739  GLUCAP 384* 327* 236*   Palliative care: DNR in place-full comfort measures in effect.  Still on significant amounts of O2 support.  Palliative care team following and assisting with symptom care.  Will defer further to palliative care team.  Nutrition Problem: Nutrition Problem: Increased nutrient needs Etiology: acute illness (COVID-19) Signs/Symptoms: estimated needs Interventions: Ensure Enlive (each supplement provides 350kcal and 20 grams of protein),MVI,Magic cup  Obesity: Estimated body mass index is 24.91 kg/m as calculated from the following:   Height as of 05/25/20: 6\' 2"  (1.88 m).   Weight as of 05/25/20: 88 kg.   RN pressure injury documentation: Pressure Injury 04/03/20 Sacrum Medial blanchable redness on sacrum (Active)  04/03/20 0341  Location: Sacrum  Location Orientation: Medial  Staging:   Wound Description (Comments): blanchable redness on sacrum  Present on Admission: Yes    GI prophylaxis: PPI  ABG:    Component Value Date/Time   PHART 7.480 (H) 06/13/2020 0605   PCO2ART 29.2 (L) 06/13/2020 0605   PO2ART 58.8 (L) 06/13/2020 0605   HCO3 21.1 06/13/2020 0605   TCO2 21 (L) 07/02/2020 0643   ACIDBASEDEF 1.6 06/13/2020 0605   O2SAT 87.2 06/13/2020 0605    Vent Settings: N/A FiO2 (%):  [100 %] 100  %  Condition - Extremely Guarded  Family Communication  : Daughter at bedside  Code Status :  DNR  Diet :  Diet Order            Diet regular Room service appropriate? Yes; Fluid consistency: Thin  Diet effective now                  Disposition Plan  :   Status is: Inpatient  Remains inpatient  appropriate because:Inpatient level of care appropriate due to severity of illness   Dispo:  Patient From: Home  Planned Disposition: Home Hospice  Medically stable for discharge: No     Barriers to discharge: Severe hypoxia-on heated high flow.  Unclear whether he will ever be ready for home hospice with this much of oxygen.  May need to consider residential hospice if his severe dyspnea/anxiety can be better controlled so that we could titrate down his FiO2.  Antimicorbials  :    Anti-infectives (From admission, onward)   Start     Dose/Rate Route Frequency Ordered Stop   06/15/20 0830  anidulafungin (ERAXIS) 100 mg in sodium chloride 0.9 % 100 mL IVPB  Status:  Discontinued        100 mg 78 mL/hr over 100 Minutes Intravenous Every 24 hours 06/14/20 0738 06/16/20 1134   06/14/20 1000  remdesivir 100 mg in sodium chloride 0.9 % 100 mL IVPB  Status:  Discontinued       "Followed by" Linked Group Details   100 mg 200 mL/hr over 30 Minutes Intravenous Daily 06/13/20 1058 06/16/20 1134   06/14/20 0830  anidulafungin (ERAXIS) 200 mg in sodium chloride 0.9 % 200 mL IVPB        200 mg 78 mL/hr over 200 Minutes Intravenous  Once 06/14/20 0738 06/14/20 1520   06/13/20 1145  remdesivir 200 mg in sodium chloride 0.9% 250 mL IVPB       "Followed by" Linked Group Details   200 mg 580 mL/hr over 30 Minutes Intravenous Once 06/13/20 1058 06/15/20 1858   06/13/20 0900  sulfamethoxazole-trimethoprim (BACTRIM DS) 800-160 MG per tablet 1 tablet  Status:  Discontinued        1 tablet Oral Once per day on Mon Wed Fri 07/01/2020 0946 06/16/20 1134   06/05/2020 1045  fluconazole (DIFLUCAN) IVPB 200  mg  Status:  Discontinued        200 mg 100 mL/hr over 60 Minutes Intravenous Every 24 hours 06/23/2020 0946 06/14/20 0738      Inpatient Medications  Scheduled Meds: . morphine CONCENTRATE  5 mg Sublingual Q6H  . predniSONE  30 mg Oral BID WC  . sodium chloride flush  3 mL Intravenous Q12H   Continuous Infusions: . sodium chloride     PRN Meds:.sodium chloride, acetaminophen **OR** acetaminophen, antiseptic oral rinse, bisacodyl, diphenhydrAMINE, glycopyrrolate **OR** glycopyrrolate **OR** glycopyrrolate, haloperidol **OR** haloperidol **OR** haloperidol lactate, LORazepam **OR** LORazepam **OR** LORazepam, morphine injection **OR** morphine CONCENTRATE, ondansetron **OR** ondansetron (ZOFRAN) IV, polyvinyl alcohol, sodium chloride flush   Time Spent in minutes  15    See all Orders from today for further details   Oren Binet M.D on 06/17/2020 at 1:54 PM  To page go to [email or web address] - use universal password  [facility] -  Office  [phone]    Objective:   Vitals:   06/16/20 1450 06/16/20 2031 06/17/20 0852 06/17/20 1058  BP:  131/86  116/85  Pulse: 66 71    Resp:  17  (!) 25  Temp:  (!) 96.5 F (35.8 C)  98.1 F (36.7 C)  TempSrc:  Axillary  Oral  SpO2: 99% 97% 95% 98%    Wt Readings from Last 3 Encounters:  05/25/20 88 kg  05/09/20 88 kg  05/05/20 88.5 kg     Intake/Output Summary (Last 24 hours) at 06/17/2020 1354 Last data filed at 06/17/2020 1157 Gross per 24 hour  Intake 0 ml  Output 0 ml  Net 0 ml  Physical Exam Gen Exam:Alert awake-not in any distress HEENT:atraumatic, normocephalic Chest: B/L clear to auscultation anteriorly CVS:S1S2 regular Abdomen:soft non tender, non distended Extremities:no edema Neurology: Non focal Skin: no rash  Data Review:    CBC Recent Labs  Lab 06/10/2020 0320 07/04/2020 0643 06/13/20 0118 06/14/20 0202 06/15/20 0126 06/16/20 0106  WBC 9.6  --  7.3 4.8 5.2 4.1  HGB 14.9 11.9* 12.7* 11.5*  11.4* 11.8*  HCT 47.3 35.0* 39.8 34.6* 34.5* 36.1*  PLT PLATELET CLUMPS NOTED ON SMEAR, UNABLE TO ESTIMATE  --  74* PLATELET CLUMPS NOTED ON SMEAR, UNABLE TO ESTIMATE PLATELET CLUMPS NOTED ON SMEAR, UNABLE TO ESTIMATE 96*  MCV 91.1  --  89.6 87.4 88.2 88.3  MCH 28.7  --  28.6 29.0 29.2 28.9  MCHC 31.5  --  31.9 33.2 33.0 32.7  RDW 20.1*  --  19.9* 19.0* 19.3* 19.5*  LYMPHSABS 0.3*  --  0.1* 0.1* 0.1* 0.1*  MONOABS 0.2  --  0.1 0.0* 0.1 0.1  EOSABS 0.0  --  0.0 0.0 0.0 0.0  BASOSABS 0.0  --  0.0 0.0 0.0 0.0    Chemistries  Recent Labs  Lab 06/30/2020 0320 06/09/2020 0643 06/13/20 0118 06/14/20 0202 06/15/20 0126 06/16/20 0106  NA 132* 132* 129* 132* 134* 134*  K 4.6 4.3 3.9 2.8* 3.9 4.3  CL 101  --  97* 97* 102 102  CO2 23  --  25 22 22 23   GLUCOSE 233*  --  210* 310* 280* 423*  BUN 32*  --  29* 37* 37* 41*  CREATININE 0.98  --  0.92 1.07 0.87 0.97  CALCIUM 9.6  --  9.1 8.9 9.2 9.5  MG  --   --  1.8 1.8 1.8 1.9  AST 53*  --  32 36 38 26  ALT 61*  --  40 33 37 32  ALKPHOS 99  --  75 74 70 78  BILITOT 0.3  --  0.5 0.8 0.7 0.6   ------------------------------------------------------------------------------------------------------------------ No results for input(s): CHOL, HDL, LDLCALC, TRIG, CHOLHDL, LDLDIRECT in the last 72 hours.  Lab Results  Component Value Date   HGBA1C 6.3 (H) 05/12/2020   ------------------------------------------------------------------------------------------------------------------ No results for input(s): TSH, T4TOTAL, T3FREE, THYROIDAB in the last 72 hours.  Invalid input(s): FREET3 ------------------------------------------------------------------------------------------------------------------ Recent Labs    06/15/20 0126 06/16/20 0106  FERRITIN 2,283* 2,331*    Coagulation profile Recent Labs  Lab 06/08/2020 0320  INR 1.7*    Recent Labs    06/15/20 0126 06/16/20 0106  DDIMER 1.32* 1.19*    Cardiac Enzymes No results for  input(s): CKMB, TROPONINI, MYOGLOBIN in the last 168 hours.  Invalid input(s): CK ------------------------------------------------------------------------------------------------------------------    Component Value Date/Time   BNP 294.8 (H) 06/01/2020 0250    Micro Results Recent Results (from the past 240 hour(s))  Resp Panel by RT-PCR (Flu A&B, Covid) Nasopharyngeal Swab     Status: Abnormal   Collection Time: 06/29/2020  3:10 AM   Specimen: Nasopharyngeal Swab; Nasopharyngeal(NP) swabs in vial transport medium  Result Value Ref Range Status   SARS Coronavirus 2 by RT PCR POSITIVE (A) NEGATIVE Final    Comment: RESULT CALLED TO, READ BACK BY AND VERIFIED WITH: RN J VASQUEZ AT [id number] 07/04/2020 BY L BENFIELD (NOTE) SARS-CoV-2 target nucleic acids are DETECTED.  The SARS-CoV-2 RNA is generally detectable in upper respiratory specimens during the acute phase of infection. Positive results are indicative of the presence of the identified virus, but do not rule out bacterial  infection or co-infection with other pathogens not detected by the test. Clinical correlation with patient history and other diagnostic information is necessary to determine patient infection status. The expected result is Negative.  Fact Sheet for Patients: EntrepreneurPulse.com.au  Fact Sheet for Healthcare Providers: IncredibleEmployment.be  This test is not yet approved or cleared by the [place] FDA and  has been authorized for detection and/or diagnosis of SARS-CoV-2 by FDA under an Emergency Use Authorization (EUA).  This EUA will remain in effect (meaning this t est can be used) for the duration of  the COVID-19 declaration under Section 564(b)(1) of the Act, 21 U.S.C. section 360bbb-3(b)(1), unless the authorization is terminated or revoked sooner.     Influenza A by PCR NEGATIVE NEGATIVE Final   Influenza B by PCR NEGATIVE NEGATIVE Final    Comment:  (NOTE) The Xpert Xpress SARS-CoV-2/FLU/RSV plus assay is intended as an aid in the diagnosis of influenza from Nasopharyngeal swab specimens and should not be used as a sole basis for treatment. Nasal washings and aspirates are unacceptable for Xpert Xpress SARS-CoV-2/FLU/RSV testing.  Fact Sheet for Patients: EntrepreneurPulse.com.au  Fact Sheet for Healthcare Providers: IncredibleEmployment.be  This test is not yet approved or cleared by the [place] FDA and has been authorized for detection and/or diagnosis of SARS-CoV-2 by FDA under an Emergency Use Authorization (EUA). This EUA will remain in effect (meaning this test can be used) for the duration of the COVID-19 declaration under Section 564(b)(1) of the Act, 21 U.S.C. section 360bbb-3(b)(1), unless the authorization is terminated or revoked.  Performed at [facility], [place] [address], [place], [place] [address]   Urine culture     Status: Abnormal   Collection Time: 06/05/2020  3:37 AM   Specimen: Urine, Clean Catch  Result Value Ref Range Status   Specimen Description URINE, CLEAN CATCH  Final   Special Requests   Final    NONE Performed at [facility], [place] [address], [place], [place] [address]    Culture (A)  Final    80,000 COLONIES/mL STAPHYLOCOCCUS EPIDERMIDIS 50,000 COLONIES/mL ENTEROCOCCUS FAECALIS    Report Status 06/14/2020 FINAL  Final   Organism ID, Bacteria STAPHYLOCOCCUS EPIDERMIDIS (A)  Final   Organism ID, Bacteria ENTEROCOCCUS FAECALIS (A)  Final      Susceptibility   Enterococcus faecalis - MIC*    AMPICILLIN <=2 SENSITIVE Sensitive     NITROFURANTOIN <=16 SENSITIVE Sensitive     VANCOMYCIN 1 SENSITIVE Sensitive     * 50,000 COLONIES/mL ENTEROCOCCUS FAECALIS   Staphylococcus epidermidis - MIC*    CIPROFLOXACIN >=8 RESISTANT Resistant     GENTAMICIN <=0.5 SENSITIVE Sensitive     NITROFURANTOIN <=16 SENSITIVE Sensitive     OXACILLIN >=4  RESISTANT Resistant     TETRACYCLINE >=16 RESISTANT Resistant     VANCOMYCIN 1 SENSITIVE Sensitive     TRIMETH/SULFA 80 RESISTANT Resistant     CLINDAMYCIN <=0.25 SENSITIVE Sensitive     RIFAMPIN <=0.5 SENSITIVE Sensitive     Inducible Clindamycin NEGATIVE Sensitive     * 80,000 COLONIES/mL STAPHYLOCOCCUS EPIDERMIDIS  Blood culture (routine single)     Status: None (Preliminary result)   Collection Time: 06/20/2020  3:40 AM   Specimen: BLOOD RIGHT ARM  Result Value Ref Range Status   Specimen Description BLOOD RIGHT ARM  Final   Special Requests   Final    BOTTLES DRAWN AEROBIC AND ANAEROBIC Blood Culture results may not be optimal due to an inadequate volume of  blood received in culture bottles   Culture   Final    NO GROWTH 4 DAYS Performed at [facility], [place] [address] [place], [place] [address]    Report Status PENDING  Incomplete  Respiratory (~20 pathogens) panel by PCR     Status: None   Collection Time: 06/13/20  4:09 PM   Specimen: Nasopharyngeal Swab; Respiratory  Result Value Ref Range Status   Adenovirus NOT DETECTED NOT DETECTED Final   Coronavirus 229E NOT DETECTED NOT DETECTED Final    Comment: (NOTE) The Coronavirus on the Respiratory Panel, DOES NOT test for the novel  Coronavirus (2019 nCoV)    Coronavirus HKU1 NOT DETECTED NOT DETECTED Final   Coronavirus NL63 NOT DETECTED NOT DETECTED Final   Coronavirus OC43 NOT DETECTED NOT DETECTED Final   Metapneumovirus NOT DETECTED NOT DETECTED Final   Rhinovirus / Enterovirus NOT DETECTED NOT DETECTED Final   Influenza A NOT DETECTED NOT DETECTED Final   Influenza B NOT DETECTED NOT DETECTED Final   Parainfluenza Virus 1 NOT DETECTED NOT DETECTED Final   Parainfluenza Virus 2 NOT DETECTED NOT DETECTED Final   Parainfluenza Virus 3 NOT DETECTED NOT DETECTED Final   Parainfluenza Virus 4 NOT DETECTED NOT DETECTED Final   Respiratory Syncytial Virus NOT DETECTED NOT DETECTED Final   Bordetella pertussis NOT  DETECTED NOT DETECTED Final   Bordetella Parapertussis NOT DETECTED NOT DETECTED Final   Chlamydophila pneumoniae NOT DETECTED NOT DETECTED Final   Mycoplasma pneumoniae NOT DETECTED NOT DETECTED Final    Comment: Performed at [facility], [place]. [address], [place], [place] [address]    Radiology Reports CT ANGIO CHEST PE W OR WO CONTRAST  Result Date: 06/15/2020 CLINICAL DATA:  I more foci of EXAM: CT ANGIOGRAPHY CHEST WITH CONTRAST TECHNIQUE: Multidetector CT imaging of the chest was performed using the standard protocol during bolus administration of intravenous contrast. Multiplanar CT image reconstructions and MIPs were obtained to evaluate the vascular anatomy. CONTRAST:  15mL OMNIPAQUE IOHEXOL 350 MG/ML SOLN COMPARISON:  CT angiography 05/14/2020, CT angiography 03/26/2020, CT angiography 11/27/2010 FINDINGS: Cardiovascular: Satisfactory opacification of the pulmonary arteries to the segmental level. Filling defect in the right lower lobe segmental pulmonary artery (5:52). No central pulmonary embolus. No definite left pulmonary embolus. Left ventricular hypertrophy. No pericardial effusion. Four-vessel coronary artery calcification. Mediastinum/Nodes: No enlarged mediastinal, hilar, or axillary lymph nodes. Thyroid gland, trachea, and esophagus demonstrate no significant findings. Lungs/Pleura: Redemonstration of cystic changes that are more apparent within the upper lobes likely related to emphysematous changes. Interlobular septal wall thickening. Redemonstration of peribronchovascular ground-glass airspace opacities and consolidation with associated bronchiectasis. No pleural effusion. No pneumothorax. Upper Abdomen: No acute abnormality. Musculoskeletal: No abdominal wall hernia or abnormality. No suspicious lytic or blastic osseous lesions. No acute displaced fracture. Multilevel degenerative changes of the spine. Review of the MIP images confirms the above findings. IMPRESSION: 1.  Residual segmental right lower lobe pulmonary embolus with interval resolution of the central pulmonary embolus component. No associated heart strain. No definite pulmonary infarction. 2. Diffuse pulmonary findings similar to at least as far back as 03/16/2020. Findings could be infectious/inflammatory. Underlying malignancy not excluded. 3. Left ventricular hypertrophy. 4. Aortic Atherosclerosis (ICD10-I70.0) and Emphysema (ICD10-J43.9). 5. Four-vessel coronary artery calcifications. These results were called by telephone at the time of interpretation on 06/16/2020 at 5:36 am to provider [facility] , who verbally acknowledged these results. Electronically Signed   By: Iven Finn M.D.   On: 07/01/2020 05:41  CT Angio Chest PE W and/or Wo Contrast  Result Date: 05/25/2020 CLINICAL DATA:  75 year old male with shortness of breath and chest pain. Concern for pulmonary embolism. EXAM: CT ANGIOGRAPHY CHEST WITH CONTRAST TECHNIQUE: Multidetector CT imaging of the chest was performed using the standard protocol during bolus administration of intravenous contrast. Multiplanar CT image reconstructions and MIPs were obtained to evaluate the vascular anatomy. CONTRAST:  38mL OMNIPAQUE IOHEXOL 350 MG/ML SOLN COMPARISON:  Chest CT dated 05/14/2020. FINDINGS: Cardiovascular: Mild cardiomegaly. No pericardial effusion. There is coronary vascular calcification. Mild atherosclerotic calcification of the thoracic aorta. Linear nonocclusive thrombus noted extending from the central right pulmonary artery into the lobar and segmental branches of the right lower lobe and right upper lobe. This is new since the prior CT. No CT evidence of right heart straining. Mediastinum/Nodes: Top-normal right hilar lymph nodes. The esophagus and the thyroid gland are grossly unremarkable. No mediastinal fluid collection. Lungs/Pleura: Bilateral patchy and streaky densities similar to prior CT. Calcified pleural plaque noted in the  left upper lobe anteriorly. No pleural effusion pneumothorax. The central airways are patent. Upper Abdomen: No acute abnormality. Musculoskeletal: No chest wall abnormality. No acute or significant osseous findings. Review of the MIP images confirms the above findings. IMPRESSION: 1. Pulmonary artery embolus extending from the central right pulmonary artery into the lobar and segmental branches of the right lower lobe and right upper lobe, new since the prior CT. No CT evidence of right heart straining. 2. Bilateral patchy and streaky densities similar to prior CT. 3. Aortic Atherosclerosis (ICD10-I70.0). These results were called by telephone at the time of interpretation on 05/25/2020 at 6:39 pm to provider DAVID YAO , who verbally acknowledged these results. Electronically Signed   By: Anner Crete M.D.   On: 05/25/2020 18:43   DG CHEST PORT 1 VIEW  Result Date: 06/13/2020 CLINICAL DATA:  Pneumonia due to COVID-19 virus.  Fever. EXAM: PORTABLE CHEST 1 VIEW COMPARISON:  1 day prior FINDINGS: Midline trachea. Normal heart size for level of inspiration. No pleural effusion or pneumothorax. Worsened, right greater than left interstitial opacities. Relatively diffuse, with more normal aerated left upper lung. IMPRESSION: Worsened COVID-19 pneumonia. Electronically Signed   By: Abigail Miyamoto M.D.   On: 06/13/2020 08:17   DG Chest Port 1 View  Result Date: 06/15/2020 CLINICAL DATA:  Sepsis EXAM: PORTABLE CHEST 1 VIEW COMPARISON:  05/30/2020 FINDINGS: Bilateral interstitial opacities, unchanged. Normal cardiomediastinal contours. No pneumothorax or sizable pleural effusion. IMPRESSION: Unchanged bilateral interstitial opacities. Electronically Signed   By: Ulyses Jarred M.D.   On: 06/08/2020 03:48   DG Chest Port 1 View  Result Date: 05/30/2020 CLINICAL DATA:  Difficulty breathing, low O2 sats, history of COVID infection. EXAM: PORTABLE CHEST 1 VIEW COMPARISON:  05/29/2020 and CT chest 05/25/2020. FINDINGS:  Heart size stable. Patchy mixed interstitial and airspace opacification bilaterally, increased from 05/29/2020. No pleural fluid. IMPRESSION: Worsening COVID-19 pneumonia. Electronically Signed   By: Lorin Picket M.D.   On: 05/30/2020 09:11   DG Chest Port 1 View  Result Date: 05/29/2020 CLINICAL DATA:  Shortness of breath.  Covid-19 viral infection. EXAM: PORTABLE CHEST 1 VIEW COMPARISON:  05/28/2020 FINDINGS: Heart size remains within normal limits. Diffuse interstitial infiltrates are again seen with mild patchy airspace opacities in the left lung base. These show no significant change since previous study. No evidence of pleural effusion. IMPRESSION: No significant change in diffuse interstitial infiltrates with mild patchy airspace disease in left lower lung. Electronically Signed   By:  Marlaine Hind M.D.   On: 05/29/2020 08:36   DG Chest Port 1 View  Result Date: 05/28/2020 CLINICAL DATA:  Shortness of breath EXAM: PORTABLE CHEST 1 VIEW COMPARISON:  Three days ago FINDINGS: Patchy bilateral pulmonary infiltrate without convincing progression. Lung volumes are low. No visible effusion or air leak. Normal heart size and stable mediastinal contours. IMPRESSION: Similar degree of bilateral pulmonary infiltrates. No new abnormality. Electronically Signed   By: Monte Fantasia M.D.   On: 05/28/2020 07:38   DG Chest Port 1 View  Result Date: 05/25/2020 CLINICAL DATA:  75 year old male with concern for sepsis. EXAM: PORTABLE CHEST 1 VIEW COMPARISON:  Chest CT dated 05/14/2020. FINDINGS: Diffuse bilateral streaky interstitial densities worsened since 05/09/2020 but relatively similar to 05/10/2020 concerning for residual infiltrate, likely on the background of chronic changes. Clinical correlation and follow-up recommended. No lobar consolidation, pleural effusion, pneumothorax. The cardiac silhouette is within limits. No acute osseous pathology. IMPRESSION: Persistent bilateral infiltrates.  Electronically Signed   By: Anner Crete M.D.   On: 05/25/2020 16:24   ECHOCARDIOGRAM COMPLETE  Result Date: 05/26/2020    ECHOCARDIOGRAM REPORT   Patient Name:   BRADDOX HARNAGE Date of Exam: 05/26/2020 Medical Rec #:  [id number]      Height:       74.0 in Accession #:    [id number]     Weight:       194.0 lb Date of Birth:  08-31-1945      BSA:          2.146 m Patient Age:    78 years       BP:           129/92 mmHg Patient Gender: M              HR:           84 bpm. Exam Location:  Inpatient Procedure: 2D Echo, Cardiac Doppler, Color Doppler and Intracardiac            Opacification Agent Indications:    Pulmonary Embolus I26.09  History:        Patient has prior history of Echocardiogram examinations, most                 recent 04/03/2020. CAD and Previous Myocardial Infarction, COPD,                 Signs/Symptoms:Shortness of Breath; Risk Factors:Hypertension                 and Dyslipidemia. COVID 19. Sepsis, due to unspecified organism,                 unspecified whether acute organ dysfunction present                 Multiple subsegmental pulmonary emboli without acute cor                 pulmonale.  Sonographer:    Darlina Sicilian RDCS Referring Phys: [id number] [place]  1. Left ventricular ejection fraction, by estimation, is 50 to 55%. The left ventricle has low normal function. The left ventricle has no regional wall motion abnormalities. There is severe asymmetric left ventricular hypertrophy of the basal and septal  segments. Left ventricular diastolic parameters are consistent with Grade II diastolic dysfunction (pseudonormalization). Elevated left ventricular end-diastolic pressure.  2. Right ventricular systolic function is normal. The right ventricular size is normal.  3. The mitral valve is abnormal. No evidence  of mitral valve regurgitation. No evidence of mitral stenosis.  4. The aortic valve is tricuspid. There is mild calcification of the aortic valve. Aortic valve  regurgitation is not visualized. Mild aortic valve sclerosis is present, with no evidence of aortic valve stenosis.  5. Aortic dilatation noted. There is mild dilatation of the aortic root, measuring 41 mm.  6. The inferior vena cava is normal in size with greater than 50% respiratory variability, suggesting right atrial pressure of 3 mmHg. FINDINGS  Left Ventricle: Left ventricular ejection fraction, by estimation, is 50 to 55%. The left ventricle has low normal function. The left ventricle has no regional wall motion abnormalities. Definity contrast agent was given IV to delineate the left ventricular endocardial borders. The left ventricular internal cavity size was normal in size. There is severe asymmetric left ventricular hypertrophy of the basal and septal segments. Left ventricular diastolic parameters are consistent with Grade II diastolic dysfunction (pseudonormalization). Elevated left ventricular end-diastolic pressure. Right Ventricle: The right ventricular size is normal. No increase in right ventricular wall thickness. Right ventricular systolic function is normal. Left Atrium: Left atrial size was normal in size. Right Atrium: Right atrial size was normal in size. Pericardium: There is no evidence of pericardial effusion. Mitral Valve: The mitral valve is abnormal. There is mild thickening of the mitral valve leaflet(s). There is mild calcification of the mitral valve leaflet(s). Mild mitral annular calcification. No evidence of mitral valve regurgitation. No evidence of mitral valve stenosis. Tricuspid Valve: The tricuspid valve is normal in structure. Tricuspid valve regurgitation is not demonstrated. No evidence of tricuspid stenosis. Aortic Valve: The aortic valve is tricuspid. There is mild calcification of the aortic valve. Aortic valve regurgitation is not visualized. Mild aortic valve sclerosis is present, with no evidence of aortic valve stenosis. Pulmonic Valve: The pulmonic valve was  normal in structure. Pulmonic valve regurgitation is not visualized. No evidence of pulmonic stenosis. Aorta: The aortic root is normal in size and structure and aortic dilatation noted. There is mild dilatation of the aortic root, measuring 41 mm. Venous: The inferior vena cava is normal in size with greater than 50% respiratory variability, suggesting right atrial pressure of 3 mmHg. IAS/Shunts: No atrial level shunt detected by color flow Doppler.  LEFT VENTRICLE PLAX 2D LVIDd:         3.20 cm     Diastology LVIDs:         2.60 cm     LV e' medial:    3.15 cm/s LV PW:         1.40 cm     LV E/e' medial:  17.1 LV IVS:        2.00 cm     LV e' lateral:   3.37 cm/s LVOT diam:     2.00 cm     LV E/e' lateral: 16.0 LV SV:         41 LV SV Index:   19 LVOT Area:     3.14 cm  LV Volumes (MOD) LV vol d, MOD A2C: 91.2 ml LV vol d, MOD A4C: 78.4 ml LV vol s, MOD A2C: 43.8 ml LV vol s, MOD A4C: 36.8 ml LV SV MOD A2C:     47.4 ml LV SV MOD A4C:     78.4 ml LV SV MOD BP:      44.4 ml RIGHT VENTRICLE RV S prime:     8.16 cm/s LEFT ATRIUM  Index       RIGHT ATRIUM          Index LA diam:        3.00 cm 1.40 cm/m  RA Area:     6.38 cm LA Vol (A2C):   20.1 ml 9.37 ml/m  RA Volume:   7.69 ml  3.58 ml/m LA Vol (A4C):   21.3 ml 9.93 ml/m LA Biplane Vol: 21.6 ml 10.07 ml/m  AORTIC VALVE LVOT Vmax:   80.50 cm/s LVOT Vmean:  60.800 cm/s LVOT VTI:    0.130 m  AORTA Ao Root diam: 4.10 cm Ao Asc diam:  3.40 cm MITRAL VALVE MV Area (PHT): 3.01 cm    SHUNTS MV Decel Time: 252 msec    Systemic VTI:  0.13 m MV E velocity: 53.80 cm/s  Systemic Diam: 2.00 cm MV A velocity: 81.50 cm/s MV E/A ratio:  0.66 Jenkins Rouge MD Electronically signed by Jenkins Rouge MD Signature Date/Time: 05/26/2020/2:30:51 PM    Final    VAS [place] LOWER EXTREMITY VENOUS (DVT)  Result Date: 05/26/2020  Lower Venous DVT Study Indications: Edema, and Swelling.  Comparison Study: 05/11/20 previous Performing Technologist: Abram Sander RVS  Examination  Guidelines: A complete evaluation includes B-mode imaging, spectral Doppler, color Doppler, and power Doppler as needed of all accessible portions of each vessel. Bilateral testing is considered an integral part of a complete examination. Limited examinations for reoccurring indications may be performed as noted. The reflux portion of the exam is performed with the patient in reverse Trendelenburg.  +---------+---------------+---------+-----------+----------+--------------+ RIGHT    CompressibilityPhasicitySpontaneityPropertiesThrombus Aging +---------+---------------+---------+-----------+----------+--------------+ CFV      Full           Yes      Yes                                 +---------+---------------+---------+-----------+----------+--------------+ SFJ      Full                                                        +---------+---------------+---------+-----------+----------+--------------+ FV Prox  Full                                                        +---------+---------------+---------+-----------+----------+--------------+ FV Mid   Full                                                        +---------+---------------+---------+-----------+----------+--------------+ FV DistalFull                                                        +---------+---------------+---------+-----------+----------+--------------+ PFV      Full                                                        +---------+---------------+---------+-----------+----------+--------------+  POP      Full           Yes      Yes                                 +---------+---------------+---------+-----------+----------+--------------+ PTV      Full                                                        +---------+---------------+---------+-----------+----------+--------------+ PERO     Full                                                         +---------+---------------+---------+-----------+----------+--------------+   +---------+---------------+---------+-----------+----------+-----------------+ LEFT     CompressibilityPhasicitySpontaneityPropertiesThrombus Aging    +---------+---------------+---------+-----------+----------+-----------------+ CFV      Full           Yes      Yes                                    +---------+---------------+---------+-----------+----------+-----------------+ SFJ      Full                                                           +---------+---------------+---------+-----------+----------+-----------------+ FV Prox  Full                                                           +---------+---------------+---------+-----------+----------+-----------------+ FV Mid   Full                                                           +---------+---------------+---------+-----------+----------+-----------------+ FV DistalFull                                                           +---------+---------------+---------+-----------+----------+-----------------+ PFV      Full                                                           +---------+---------------+---------+-----------+----------+-----------------+ POP      None           No  No                   Age Indeterminate +---------+---------------+---------+-----------+----------+-----------------+ PTV      None                                         Age Indeterminate +---------+---------------+---------+-----------+----------+-----------------+ PERO     None                                         Age Indeterminate +---------+---------------+---------+-----------+----------+-----------------+     Summary: RIGHT: - There is no evidence of deep vein thrombosis in the lower extremity.  - No cystic structure found in the popliteal fossa.  LEFT: - Findings consistent with age indeterminate deep vein  thrombosis involving the left popliteal vein, left posterior tibial veins, and left peroneal veins. - No cystic structure found in the popliteal fossa.  *See table(s) above for measurements and observations. Electronically signed by Harold Barban MD on 05/26/2020 at 9:28:41 PM.    Final

## 2020-06-17 NOTE — Progress Notes (Addendum)
This chaplain attempted spiritual care visit.  The Pt. is  participating in Pt. care at the time of the visit.  This chaplain is available for F/U spiritual care as needed, [phone].  [phone] This chaplain is bedside with the Pt. and  family.    The chaplain learns from the family the Pt. has finished his preparations for dying, with the exception of a having a meal together.  The chaplain understands the Pt. is always taking care of other people. The family is sharing the Pt. love with one another.    The family accepts the chaplain's invitation for prayer. F/U spiritual care is available as needed.

## 2020-06-18 LAB — CULTURE, FUNGUS WITHOUT SMEAR

## 2020-06-22 ENCOUNTER — Ambulatory Visit: Payer: Self-pay

## 2020-06-22 NOTE — Chronic Care Management (AMB) (Signed)
  Care Management   Follow Up Note   06/22/2020 Name: Tyrone Roman MRN: [id number] DOB: 1945/06/14   Referred by: [name], MD Reason for referral : Chronic Care Management (Case Closure)   Pt expired 2020-07-03.  RNCM will close case  Follow Up Plan: CCM enrollment status changed to "previously enrolled" as patient has expired on 07/03/2020 Plan to discontinue enrollment. Case closed to case management services in primary care home.   Peter Garter RN, Jackquline Denmark, CDE Care Management Coordinator [facility] [phone], Mobile [phone]

## 2020-06-27 ENCOUNTER — Inpatient Hospital Stay: Payer: Self-pay | Admitting: Pulmonary Disease

## 2020-07-06 NOTE — Progress Notes (Signed)
Spoke with Dr. Sloan Leiter. Tyrone Roman passed away at 08:05.

## 2020-07-06 NOTE — Death Summary Note (Signed)
DEATH SUMMARY   Patient Details  Name: Tyrone Roman MRN: [id number] DOB: [birth year]  Admission/Discharge Information   Admit Date:  Jun 29, 2020  Date of Death: Date of Death: Jul 05, 2020  Time of Death: Time of Death: 0805  Length of Stay: 6  Referring Physician: [name], MD   Reason(s) for Hospitalization  Acute hypoxic respiratory failure due to COVID-19 pneumonia  Diagnoses  Preliminary cause of death:  Secondary Diagnoses (including complications and co-morbidities):  Principal Problem:   Acute respiratory failure with hypoxia ([place]) Active Problems:   Hyperlipidemia   COPD (chronic obstructive pulmonary disease) ([place])   Essential hypertension   COVID-19 pneumonia   Pulmonary embolus (HCC)   SIRS (systemic inflammatory response syndrome) ([place])   Thrush   Goals of care, counseling/discussion   Brief Hospital Course (including significant findings, care, treatment, and services provided and events leading to death)  Brief Narrative: Patient is a 75 y.o. male with PMHx of RA, COPD, CAD s/p PCI, HTN, HLD-who had COVID-19 infection in January 2021-since then he has had numerous hospitalizations for recurrent pneumonia/fever-ultimately felt to be due to persistent/acute COVID-19 infection in a setting of severe immunocompromised state (s/p Rituxan infusion-COVID IgG antibody negative inspite of vaccination/natural infection).  His most recent hospitalization was from 4/20-4/28-during this hospitalization-he presented with worsening respiratory failure-and responded to IV steroids and monoclonal antibody infusion-he was discharged home on his usual regimen of 2-3 L.  He again presented to the hospital on 2022/06/30 with worsening hypoxemia-failed to be due to ongoing COVID-19 pneumonitis-given monoclonal antibody, high titer convalescent plasma, IV steroids, IV remdesivir without any significant clinical improvement.  He remained severely hypoxemic on heated high flow and NRB.  Due to  lack of any improvement-and in fact continued severe hypoxia-extensive discussions was held with patient/family-he was subsequently transitioned to full comfort measures on 5/12-and subsequently expired on 07-06-22 at 8:05 AM.  COVID-19 vaccinated status: Vaccinated including booster  Significant Events: 5/8>> Admit to [facility] for acute on chronic hypoxia-due to COVID-19 pneumonitis. 5/12>> transitioned to comfort measures.  Significant studies: 5/8>> CTA chest: Residual PE in right lower lobe-diffuse pulmonary infiltrates.  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  He was profoundly symptomatic with severe dyspnea with minimal activity including the active eating.  After extensive discussion with patient-family-he was subsequently transitioned to full comfort measures on 5/12.  Palliative care followed closely.  On 5/13-he was started on a morphine infusion to control his symptoms.  He subsequently expired on Jul 06, 2022 8:05 AM with family at bedside.  This MD offered his condolences to family.  Pulmonary embolism: No longer on anticoagulation-as  transitioned to full comfort measures.  Oral thrush: Prior sputum culture positive for Candida glabrata-he was on Eraxis-this was discontinued on 5/12.    CAD: No anginal symptoms-no role for  aspirin.  Rheumatoid arthritis: On Rituxan previously-symptoms stable on steroids.  See above regarding steroids.  COPD: Stable-not in exacerbation-was maintained on bronchodilators for  comfort.  BPH: He was on Flomax-Foley was kept in place for comfort.  HTN: stopped Lopressor-once he was transitioned to full comfort measures.  HLD: Stopped Zocor-once he was transitioned to full comfort measures.  DM-2:  CBG was stopped-insulin administration was also stopped as he was on full comfort measures.  Pertinent Labs and Studies  Significant Diagnostic Studies CT ANGIO CHEST PE W OR WO CONTRAST  Result Date: 06/08/2020 CLINICAL DATA:  I more foci of EXAM: CT ANGIOGRAPHY CHEST WITH CONTRAST TECHNIQUE: Multidetector CT imaging of the chest was performed using the standard protocol during bolus administration of intravenous contrast. Multiplanar CT image reconstructions and MIPs were obtained to evaluate the vascular anatomy. CONTRAST:  64mL OMNIPAQUE IOHEXOL 350 MG/ML SOLN COMPARISON:  CT angiography 05/14/2020, CT angiography 03/26/2020, CT angiography 11/27/2010 FINDINGS: Cardiovascular: Satisfactory opacification of the pulmonary arteries to the segmental level. Filling defect in the right lower lobe segmental pulmonary artery (5:52). No central pulmonary embolus. No definite left pulmonary embolus. Left ventricular hypertrophy. No pericardial effusion. Four-vessel coronary artery calcification. Mediastinum/Nodes: No enlarged mediastinal, hilar, or axillary lymph nodes. Thyroid gland, trachea, and esophagus demonstrate no significant findings. Lungs/Pleura: Redemonstration of cystic changes that are more apparent within the upper lobes likely related to emphysematous changes. Interlobular septal wall thickening. Redemonstration of peribronchovascular ground-glass airspace opacities and consolidation with associated bronchiectasis. No pleural effusion. No pneumothorax. Upper Abdomen: No acute  abnormality. Musculoskeletal: No abdominal wall hernia or abnormality. No suspicious lytic or blastic osseous lesions. No acute displaced fracture. Multilevel degenerative changes of the spine. Review of the MIP images confirms the above findings. IMPRESSION: 1. Residual segmental right lower lobe pulmonary embolus with interval resolution of the central pulmonary embolus component. No associated heart strain. No definite pulmonary infarction. 2. Diffuse pulmonary findings similar to at least as far back as 03/16/2020. Findings could be infectious/inflammatory. Underlying malignancy not excluded. 3. Left ventricular hypertrophy. 4. Aortic Atherosclerosis (ICD10-I70.0) and Emphysema (ICD10-J43.9). 5. Four-vessel coronary artery calcifications. These results were called by telephone at the time of interpretation on 06/11/2020 at 5:36 am to provider [facility] , who verbally acknowledged these results. Electronically Signed   By: Iven Finn M.D.   On: 07/03/2020 05:41   CT Angio Chest PE W and/or Wo Contrast  Result Date: 05/25/2020 CLINICAL DATA:  75 year old male with shortness of breath and chest pain. Concern for pulmonary embolism. EXAM: CT ANGIOGRAPHY CHEST WITH CONTRAST TECHNIQUE: Multidetector CT imaging of the chest was performed using the standard protocol during bolus administration of intravenous contrast. Multiplanar CT image reconstructions and MIPs were obtained to evaluate the vascular anatomy. CONTRAST:  23mL OMNIPAQUE IOHEXOL 350 MG/ML SOLN COMPARISON:  Chest CT dated 05/14/2020. FINDINGS: Cardiovascular: Mild cardiomegaly. No pericardial effusion. There is coronary vascular calcification. Mild atherosclerotic calcification of the thoracic aorta. Linear nonocclusive thrombus noted extending from the central right pulmonary artery into the lobar and segmental branches of the right lower lobe and right upper lobe. This is new since the prior CT. No CT evidence of right heart straining.  Mediastinum/Nodes: Top-normal right hilar lymph nodes. The esophagus and the thyroid gland are grossly unremarkable. No mediastinal fluid collection. Lungs/Pleura: Bilateral patchy and streaky densities similar to prior CT. Calcified pleural plaque noted in the left upper lobe anteriorly. No pleural effusion pneumothorax. The central airways are patent. Upper Abdomen: No acute abnormality. Musculoskeletal: No chest wall abnormality. No acute or significant osseous findings. Review of the MIP images confirms the above findings. IMPRESSION: 1. Pulmonary artery embolus extending from the  central right pulmonary artery into the lobar and segmental branches of the right lower lobe and right upper lobe, new since the prior CT. No CT evidence of right heart straining. 2. Bilateral patchy and streaky densities similar to prior CT. 3. Aortic Atherosclerosis (ICD10-I70.0). These results were called by telephone at the time of interpretation on 05/25/2020 at 6:39 pm to provider DAVID YAO , who verbally acknowledged these results. Electronically Signed   By: Anner Crete M.D.   On: 05/25/2020 18:43   DG CHEST PORT 1 VIEW  Result Date: 06/13/2020 CLINICAL DATA:  Pneumonia due to COVID-19 virus.  Fever. EXAM: PORTABLE CHEST 1 VIEW COMPARISON:  1 day prior FINDINGS: Midline trachea. Normal heart size for level of inspiration. No pleural effusion or pneumothorax. Worsened, right greater than left interstitial opacities. Relatively diffuse, with more normal aerated left upper lung. IMPRESSION: Worsened COVID-19 pneumonia. Electronically Signed   By: Abigail Miyamoto M.D.   On: 06/13/2020 08:17   DG Chest Port 1 View  Result Date: 06-21-2020 CLINICAL DATA:  Sepsis EXAM: PORTABLE CHEST 1 VIEW COMPARISON:  05/30/2020 FINDINGS: Bilateral interstitial opacities, unchanged. Normal cardiomediastinal contours. No pneumothorax or sizable pleural effusion. IMPRESSION: Unchanged bilateral interstitial opacities. Electronically Signed    By: Ulyses Jarred M.D.   On: 06-21-2020 03:48   DG Chest Port 1 View  Result Date: 05/30/2020 CLINICAL DATA:  Difficulty breathing, low O2 sats, history of COVID infection. EXAM: PORTABLE CHEST 1 VIEW COMPARISON:  05/29/2020 and CT chest 05/25/2020. FINDINGS: Heart size stable. Patchy mixed interstitial and airspace opacification bilaterally, increased from 05/29/2020. No pleural fluid. IMPRESSION: Worsening COVID-19 pneumonia. Electronically Signed   By: Lorin Picket M.D.   On: 05/30/2020 09:11   DG Chest Port 1 View  Result Date: 05/29/2020 CLINICAL DATA:  Shortness of breath.  Covid-19 viral infection. EXAM: PORTABLE CHEST 1 VIEW COMPARISON:  05/28/2020 FINDINGS: Heart size remains within normal limits. Diffuse interstitial infiltrates are again seen with mild patchy airspace opacities in the left lung base. These show no significant change since previous study. No evidence of pleural effusion. IMPRESSION: No significant change in diffuse interstitial infiltrates with mild patchy airspace disease in left lower lung. Electronically Signed   By: Marlaine Hind M.D.   On: 05/29/2020 08:36   DG Chest Port 1 View  Result Date: 05/28/2020 CLINICAL DATA:  Shortness of breath EXAM: PORTABLE CHEST 1 VIEW COMPARISON:  Three days ago FINDINGS: Patchy bilateral pulmonary infiltrate without convincing progression. Lung volumes are low. No visible effusion or air leak. Normal heart size and stable mediastinal contours. IMPRESSION: Similar degree of bilateral pulmonary infiltrates. No new abnormality. Electronically Signed   By: Monte Fantasia M.D.   On: 05/28/2020 07:38   DG Chest Port 1 View  Result Date: 05/25/2020 CLINICAL DATA:  75 year old male with concern for sepsis. EXAM: PORTABLE CHEST 1 VIEW COMPARISON:  Chest CT dated 05/14/2020. FINDINGS: Diffuse bilateral streaky interstitial densities worsened since 05/09/2020 but relatively similar to 05/10/2020 concerning for residual infiltrate, likely on the  background of chronic changes. Clinical correlation and follow-up recommended. No lobar consolidation, pleural effusion, pneumothorax. The cardiac silhouette is within limits. No acute osseous pathology. IMPRESSION: Persistent bilateral infiltrates. Electronically Signed   By: Anner Crete M.D.   On: 05/25/2020 16:24   ECHOCARDIOGRAM COMPLETE  Result Date: 05/26/2020    ECHOCARDIOGRAM REPORT   Patient Name:   REYNOL ARNONE Date of Exam: 05/26/2020 Medical Rec #:  [id number]      Height:  74.0 in Accession #:    [id number]     Weight:       194.0 lb Date of Birth:  Jan 06, 1946      BSA:          2.146 m Patient Age:    24 years       BP:           129/92 mmHg Patient Gender: M              HR:           84 bpm. Exam Location:  Inpatient Procedure: 2D Echo, Cardiac Doppler, Color Doppler and Intracardiac            Opacification Agent Indications:    Pulmonary Embolus I26.09  History:        Patient has prior history of Echocardiogram examinations, most                 recent 04/03/2020. CAD and Previous Myocardial Infarction, COPD,                 Signs/Symptoms:Shortness of Breath; Risk Factors:Hypertension                 and Dyslipidemia. COVID 19. Sepsis, due to unspecified organism,                 unspecified whether acute organ dysfunction present                 Multiple subsegmental pulmonary emboli without acute cor                 pulmonale.  Sonographer:    Darlina Sicilian RDCS Referring Phys: [id number] [place]  1. Left ventricular ejection fraction, by estimation, is 50 to 55%. The left ventricle has low normal function. The left ventricle has no regional wall motion abnormalities. There is severe asymmetric left ventricular hypertrophy of the basal and septal  segments. Left ventricular diastolic parameters are consistent with Grade II diastolic dysfunction (pseudonormalization). Elevated left ventricular end-diastolic pressure.  2. Right ventricular systolic function is  normal. The right ventricular size is normal.  3. The mitral valve is abnormal. No evidence of mitral valve regurgitation. No evidence of mitral stenosis.  4. The aortic valve is tricuspid. There is mild calcification of the aortic valve. Aortic valve regurgitation is not visualized. Mild aortic valve sclerosis is present, with no evidence of aortic valve stenosis.  5. Aortic dilatation noted. There is mild dilatation of the aortic root, measuring 41 mm.  6. The inferior vena cava is normal in size with greater than 50% respiratory variability, suggesting right atrial pressure of 3 mmHg. FINDINGS  Left Ventricle: Left ventricular ejection fraction, by estimation, is 50 to 55%. The left ventricle has low normal function. The left ventricle has no regional wall motion abnormalities. Definity contrast agent was given IV to delineate the left ventricular endocardial borders. The left ventricular internal cavity size was normal in size. There is severe asymmetric left ventricular hypertrophy of the basal and septal segments. Left ventricular diastolic parameters are consistent with Grade II diastolic dysfunction (pseudonormalization). Elevated left ventricular end-diastolic pressure. Right Ventricle: The right ventricular size is normal. No increase in right ventricular wall thickness. Right ventricular systolic function is normal. Left Atrium: Left atrial size was normal in size. Right Atrium: Right atrial size was normal in size. Pericardium: There is no evidence of pericardial effusion. Mitral Valve: The mitral valve is abnormal. There is mild thickening  of the mitral valve leaflet(s). There is mild calcification of the mitral valve leaflet(s). Mild mitral annular calcification. No evidence of mitral valve regurgitation. No evidence of mitral valve stenosis. Tricuspid Valve: The tricuspid valve is normal in structure. Tricuspid valve regurgitation is not demonstrated. No evidence of tricuspid stenosis. Aortic Valve:  The aortic valve is tricuspid. There is mild calcification of the aortic valve. Aortic valve regurgitation is not visualized. Mild aortic valve sclerosis is present, with no evidence of aortic valve stenosis. Pulmonic Valve: The pulmonic valve was normal in structure. Pulmonic valve regurgitation is not visualized. No evidence of pulmonic stenosis. Aorta: The aortic root is normal in size and structure and aortic dilatation noted. There is mild dilatation of the aortic root, measuring 41 mm. Venous: The inferior vena cava is normal in size with greater than 50% respiratory variability, suggesting right atrial pressure of 3 mmHg. IAS/Shunts: No atrial level shunt detected by color flow Doppler.  LEFT VENTRICLE PLAX 2D LVIDd:         3.20 cm     Diastology LVIDs:         2.60 cm     LV e' medial:    3.15 cm/s LV PW:         1.40 cm     LV E/e' medial:  17.1 LV IVS:        2.00 cm     LV e' lateral:   3.37 cm/s LVOT diam:     2.00 cm     LV E/e' lateral: 16.0 LV SV:         41 LV SV Index:   19 LVOT Area:     3.14 cm  LV Volumes (MOD) LV vol d, MOD A2C: 91.2 ml LV vol d, MOD A4C: 78.4 ml LV vol s, MOD A2C: 43.8 ml LV vol s, MOD A4C: 36.8 ml LV SV MOD A2C:     47.4 ml LV SV MOD A4C:     78.4 ml LV SV MOD BP:      44.4 ml RIGHT VENTRICLE RV S prime:     8.16 cm/s LEFT ATRIUM             Index       RIGHT ATRIUM          Index LA diam:        3.00 cm 1.40 cm/m  RA Area:     6.38 cm LA Vol (A2C):   20.1 ml 9.37 ml/m  RA Volume:   7.69 ml  3.58 ml/m LA Vol (A4C):   21.3 ml 9.93 ml/m LA Biplane Vol: 21.6 ml 10.07 ml/m  AORTIC VALVE LVOT Vmax:   80.50 cm/s LVOT Vmean:  60.800 cm/s LVOT VTI:    0.130 m  AORTA Ao Root diam: 4.10 cm Ao Asc diam:  3.40 cm MITRAL VALVE MV Area (PHT): 3.01 cm    SHUNTS MV Decel Time: 252 msec    Systemic VTI:  0.13 m MV E velocity: 53.80 cm/s  Systemic Diam: 2.00 cm MV A velocity: 81.50 cm/s MV E/A ratio:  0.66 Jenkins Rouge MD Electronically signed by Jenkins Rouge MD Signature Date/Time:  05/26/2020/2:30:51 PM    Final    VAS [place] LOWER EXTREMITY VENOUS (DVT)  Result Date: 05/26/2020  Lower Venous DVT Study Indications: Edema, and Swelling.  Comparison Study: 05/11/20 previous Performing Technologist: Abram Sander RVS  Examination Guidelines: A complete evaluation includes B-mode imaging, spectral Doppler, color Doppler, and power Doppler as needed  of all accessible portions of each vessel. Bilateral testing is considered an integral part of a complete examination. Limited examinations for reoccurring indications may be performed as noted. The reflux portion of the exam is performed with the patient in reverse Trendelenburg.  +---------+---------------+---------+-----------+----------+--------------+ RIGHT    CompressibilityPhasicitySpontaneityPropertiesThrombus Aging +---------+---------------+---------+-----------+----------+--------------+ CFV      Full           Yes      Yes                                 +---------+---------------+---------+-----------+----------+--------------+ SFJ      Full                                                        +---------+---------------+---------+-----------+----------+--------------+ FV Prox  Full                                                        +---------+---------------+---------+-----------+----------+--------------+ FV Mid   Full                                                        +---------+---------------+---------+-----------+----------+--------------+ FV DistalFull                                                        +---------+---------------+---------+-----------+----------+--------------+ PFV      Full                                                        +---------+---------------+---------+-----------+----------+--------------+ POP      Full           Yes      Yes                                 +---------+---------------+---------+-----------+----------+--------------+ PTV       Full                                                        +---------+---------------+---------+-----------+----------+--------------+ PERO     Full                                                        +---------+---------------+---------+-----------+----------+--------------+   +---------+---------------+---------+-----------+----------+-----------------+ LEFT  CompressibilityPhasicitySpontaneityPropertiesThrombus Aging    +---------+---------------+---------+-----------+----------+-----------------+ CFV      Full           Yes      Yes                                    +---------+---------------+---------+-----------+----------+-----------------+ SFJ      Full                                                           +---------+---------------+---------+-----------+----------+-----------------+ FV Prox  Full                                                           +---------+---------------+---------+-----------+----------+-----------------+ FV Mid   Full                                                           +---------+---------------+---------+-----------+----------+-----------------+ FV DistalFull                                                           +---------+---------------+---------+-----------+----------+-----------------+ PFV      Full                                                           +---------+---------------+---------+-----------+----------+-----------------+ POP      None           No       No                   Age Indeterminate +---------+---------------+---------+-----------+----------+-----------------+ PTV      None                                         Age Indeterminate +---------+---------------+---------+-----------+----------+-----------------+ PERO     None                                         Age Indeterminate  +---------+---------------+---------+-----------+----------+-----------------+     Summary: RIGHT: - There is no evidence of deep vein thrombosis in the lower extremity.  - No cystic structure found in the popliteal fossa.  LEFT: - Findings consistent with age indeterminate deep vein thrombosis involving the left popliteal vein, left posterior tibial veins, and left peroneal veins. - No cystic structure found in the popliteal fossa.  *See table(s) above for measurements  and observations. Electronically signed by Harold Barban MD on 05/26/2020 at 9:28:41 PM.    Final     Microbiology Recent Results (from the past 240 hour(s))  Resp Panel by RT-PCR (Flu A&B, Covid) Nasopharyngeal Swab     Status: Abnormal   Collection Time: 06/10/2020  3:10 AM   Specimen: Nasopharyngeal Swab; Nasopharyngeal(NP) swabs in vial transport medium  Result Value Ref Range Status   SARS Coronavirus 2 by RT PCR POSITIVE (A) NEGATIVE Final    Comment: RESULT CALLED TO, READ BACK BY AND VERIFIED WITH: RN J VASQUEZ AT [id number] 06/24/2020 BY L BENFIELD (NOTE) SARS-CoV-2 target nucleic acids are DETECTED.  The SARS-CoV-2 RNA is generally detectable in upper respiratory specimens during the acute phase of infection. Positive results are indicative of the presence of the identified virus, but do not rule out bacterial infection or co-infection with other pathogens not detected by the test. Clinical correlation with patient history and other diagnostic information is necessary to determine patient infection status. The expected result is Negative.  Fact Sheet for Patients: EntrepreneurPulse.com.au  Fact Sheet for Healthcare Providers: IncredibleEmployment.be  This test is not yet approved or cleared by the [place] FDA and  has been authorized for detection and/or diagnosis of SARS-CoV-2 by FDA under an Emergency Use Authorization (EUA).  This EUA will remain in effect (meaning this t est  can be used) for the duration of  the COVID-19 declaration under Section 564(b)(1) of the Act, 21 U.S.C. section 360bbb-3(b)(1), unless the authorization is terminated or revoked sooner.     Influenza A by PCR NEGATIVE NEGATIVE Final   Influenza B by PCR NEGATIVE NEGATIVE Final    Comment: (NOTE) The Xpert Xpress SARS-CoV-2/FLU/RSV plus assay is intended as an aid in the diagnosis of influenza from Nasopharyngeal swab specimens and should not be used as a sole basis for treatment. Nasal washings and aspirates are unacceptable for Xpert Xpress SARS-CoV-2/FLU/RSV testing.  Fact Sheet for Patients: EntrepreneurPulse.com.au  Fact Sheet for Healthcare Providers: IncredibleEmployment.be  This test is not yet approved or cleared by the [place] FDA and has been authorized for detection and/or diagnosis of SARS-CoV-2 by FDA under an Emergency Use Authorization (EUA). This EUA will remain in effect (meaning this test can be used) for the duration of the COVID-19 declaration under Section 564(b)(1) of the Act, 21 U.S.C. section 360bbb-3(b)(1), unless the authorization is terminated or revoked.  Performed at [facility], [place] [address], [place], [place] [address]   Urine culture     Status: Abnormal   Collection Time: 06/21/2020  3:37 AM   Specimen: Urine, Clean Catch  Result Value Ref Range Status   Specimen Description URINE, CLEAN CATCH  Final   Special Requests   Final    NONE Performed at [facility], [place] [address], [place], [place] [address]    Culture (A)  Final    80,000 COLONIES/mL STAPHYLOCOCCUS EPIDERMIDIS 50,000 COLONIES/mL ENTEROCOCCUS FAECALIS    Report Status 06/14/2020 FINAL  Final   Organism ID, Bacteria STAPHYLOCOCCUS EPIDERMIDIS (A)  Final   Organism ID, Bacteria ENTEROCOCCUS FAECALIS (A)  Final      Susceptibility   Enterococcus faecalis - MIC*    AMPICILLIN <=2 SENSITIVE Sensitive     NITROFURANTOIN <=16  SENSITIVE Sensitive     VANCOMYCIN 1 SENSITIVE Sensitive     * 50,000 COLONIES/mL ENTEROCOCCUS FAECALIS   Staphylococcus epidermidis - MIC*    CIPROFLOXACIN >=8 RESISTANT Resistant     GENTAMICIN <=0.5  SENSITIVE Sensitive     NITROFURANTOIN <=16 SENSITIVE Sensitive     OXACILLIN >=4 RESISTANT Resistant     TETRACYCLINE >=16 RESISTANT Resistant     VANCOMYCIN 1 SENSITIVE Sensitive     TRIMETH/SULFA 80 RESISTANT Resistant     CLINDAMYCIN <=0.25 SENSITIVE Sensitive     RIFAMPIN <=0.5 SENSITIVE Sensitive     Inducible Clindamycin NEGATIVE Sensitive     * 80,000 COLONIES/mL STAPHYLOCOCCUS EPIDERMIDIS  Blood culture (routine single)     Status: None   Collection Time: 06/10/2020  3:40 AM   Specimen: BLOOD RIGHT ARM  Result Value Ref Range Status   Specimen Description BLOOD RIGHT ARM  Final   Special Requests   Final    BOTTLES DRAWN AEROBIC AND ANAEROBIC Blood Culture results may not be optimal due to an inadequate volume of blood received in culture bottles   Culture   Final    NO GROWTH 5 DAYS Performed at [place] [address], [place], [place] [address]    Report Status 06/17/2020 FINAL  Final  Respiratory (~20 pathogens) panel by PCR     Status: None   Collection Time: 06/13/20  4:09 PM   Specimen: Nasopharyngeal Swab; Respiratory  Result Value Ref Range Status   Adenovirus NOT DETECTED NOT DETECTED Final   Coronavirus 229E NOT DETECTED NOT DETECTED Final    Comment: (NOTE) The Coronavirus on the Respiratory Panel, DOES NOT test for the novel  Coronavirus (2019 nCoV)    Coronavirus HKU1 NOT DETECTED NOT DETECTED Final   Coronavirus NL63 NOT DETECTED NOT DETECTED Final   Coronavirus OC43 NOT DETECTED NOT DETECTED Final   Metapneumovirus NOT DETECTED NOT DETECTED Final   Rhinovirus / Enterovirus NOT DETECTED NOT DETECTED Final   Influenza A NOT DETECTED NOT DETECTED Final   Influenza B NOT DETECTED NOT DETECTED Final   Parainfluenza Virus 1 NOT DETECTED NOT  DETECTED Final   Parainfluenza Virus 2 NOT DETECTED NOT DETECTED Final   Parainfluenza Virus 3 NOT DETECTED NOT DETECTED Final   Parainfluenza Virus 4 NOT DETECTED NOT DETECTED Final   Respiratory Syncytial Virus NOT DETECTED NOT DETECTED Final   Bordetella pertussis NOT DETECTED NOT DETECTED Final   Bordetella Parapertussis NOT DETECTED NOT DETECTED Final   Chlamydophila pneumoniae NOT DETECTED NOT DETECTED Final   Mycoplasma pneumoniae NOT DETECTED NOT DETECTED Final    Comment: Performed at [facility], [place] [address], [place], [place] [address]    Lab Basic Metabolic Panel: Recent Labs  Lab 06/16/2020 0320 06/11/2020 1025 06/13/20 0118 06/14/20 0202 06/15/20 0126 06/16/20 0106  NA 132* 132* 129* 132* 134* 134*  K 4.6 4.3 3.9 2.8* 3.9 4.3  CL 101  --  97* 97* 102 102  CO2 23  --  25 22 22 23   GLUCOSE 233*  --  210* 310* 280* 423*  BUN 32*  --  29* 37* 37* 41*  CREATININE 0.98  --  0.92 1.07 0.87 0.97  CALCIUM 9.6  --  9.1 8.9 9.2 9.5  MG  --   --  1.8 1.8 1.8 1.9  PHOS  --   --  3.6 2.5 1.8* 2.4*   Liver Function Tests: Recent Labs  Lab 06/25/2020 0320 06/13/20 0118 06/14/20 0202 06/15/20 0126 06/16/20 0106  AST 53* 32 36 38 26  ALT 61* 40 33 37 32  ALKPHOS 99 75 74 70 78  BILITOT 0.3 0.5 0.8 0.7 0.6  PROT 5.4* 5.0* 4.9* 4.6* 4.7*  ALBUMIN 2.2* 2.1* 1.8* 1.7* 1.7*   No results for input(s): LIPASE, AMYLASE in the last 168 hours. No results for input(s): AMMONIA in the last 168 hours. CBC: Recent Labs  Lab 06/25/2020 0320 06/08/2020 0643 06/13/20 0118 06/14/20 0202 06/15/20 0126 06/16/20 0106  WBC 9.6  --  7.3 4.8 5.2 4.1  NEUTROABS 8.9*  --  7.0 4.6 5.0 3.9  HGB 14.9 11.9* 12.7* 11.5* 11.4* 11.8*  HCT 47.3 35.0* 39.8 34.6* 34.5* 36.1*  MCV 91.1  --  89.6 87.4 88.2 88.3  PLT PLATELET CLUMPS NOTED ON SMEAR, UNABLE TO ESTIMATE  --  74* PLATELET CLUMPS NOTED ON SMEAR, UNABLE TO ESTIMATE PLATELET CLUMPS NOTED ON SMEAR, UNABLE TO ESTIMATE 96*   Cardiac  Enzymes: No results for input(s): CKTOTAL, CKMB, CKMBINDEX, TROPONINI in the last 168 hours. Sepsis Labs: Recent Labs  Lab 06/17/2020 0545 06/07/2020 1014 06/24/2020 1326 06/13/20 0118 06/14/20 0202 06/15/20 0126 06/16/20 0106  PROCALCITON  --  <0.10  --   --  0.77  --   --   WBC  --   --   --  7.3 4.8 5.2 4.1  LATICACIDVEN 2.8* 2.7* 3.2* 3.0*  --   --   --     Procedures/Operations     Conley Delisle Jul 03, 2020, 1:40 PM

## 2020-07-06 NOTE — Progress Notes (Signed)
   06/22/2020 0808  Clinical Encounter Type  Visited With Patient and family together  Visit Type Spiritual support;Death  Referral From Nurse  Consult/Referral To Chaplain   Night chaplain originally responded to page. Laurance Flatten prayed with daughter at bedside. After shift change, this chaplain followed up after other daughters had arrived. No further spiritual care needed right now. Chaplain remains available.   This note was prepared by Chaplain Resident, Dante Gang, MDiv. Chaplain remains available as needed through the on-call pager: [phone].

## 2020-07-06 NOTE — Progress Notes (Signed)
Wasted dilaudid from patient's room in [place] with Rudene Anda, Agricultural consultant.

## 2020-07-06 DEATH — deceased

## 2020-07-07 ENCOUNTER — Telehealth: Payer: Medicare Other

## 2020-07-26 LAB — ACID FAST CULTURE WITH REFLEXED SENSITIVITIES (MYCOBACTERIA): Acid Fast Culture: NEGATIVE

## 2021-01-05 ENCOUNTER — Other Ambulatory Visit (HOSPITAL_COMMUNITY): Payer: Self-pay

## 2022-07-24 IMAGING — MR MR CERVICAL SPINE WO/W CM
4 of 8 series · 17 of 48 positions shown · IV contrast (Yes   MULTIHANCE)
Comparison: Prior MRI from 03/24/2010.

CLINICAL DATA: Initial screening for possible epidural abscess.

EXAM:
MRI CERVICAL SPINE WITHOUT AND WITH CONTRAST
TECHNIQUE: Multiplanar and multiecho pulse sequences of the cervical spine, to
include the craniocervical junction and cervicothoracic junction,
were obtained without and with intravenous contrast.
CONTRAST:  8mL GADAVIST GADOBUTROL 1 MMOL/ML IV SOLN

[Series 3: T2 · sagittal · 3.0mm · 0.43mm/px · 4 of 14 slices shown (1 of 2)]
[im 1/14]
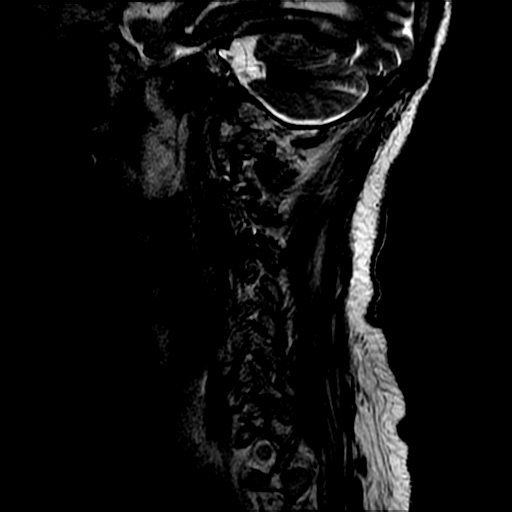
[im 5/14]
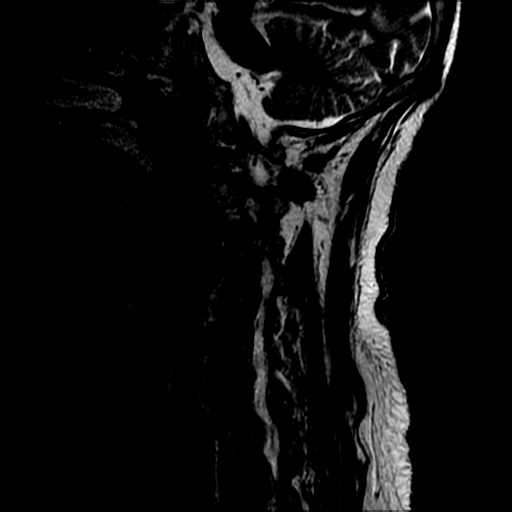
[im 9/14]
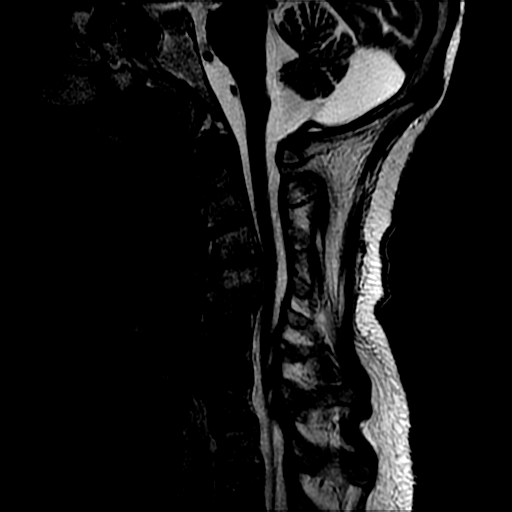
[im 14/14]
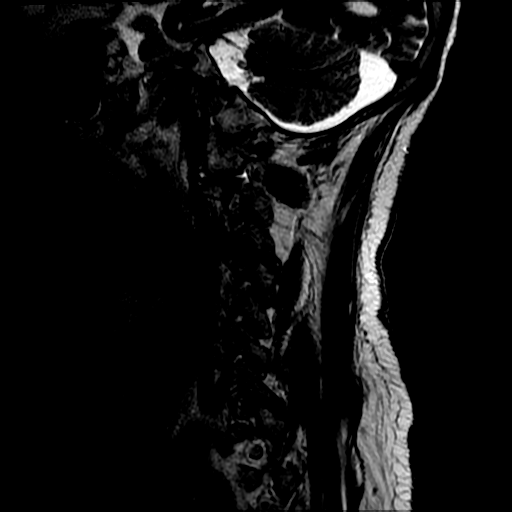

[Series 5: T1 · sagittal · 3.0mm · 0.43mm/px · 3 of 14 slices shown (1 of 2)]
[im 1/14]
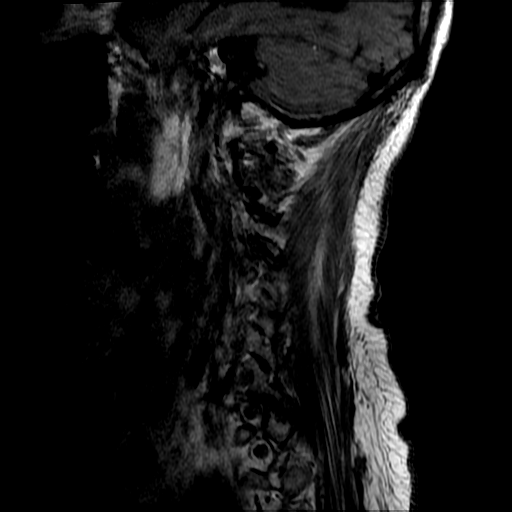
[im 9/14]
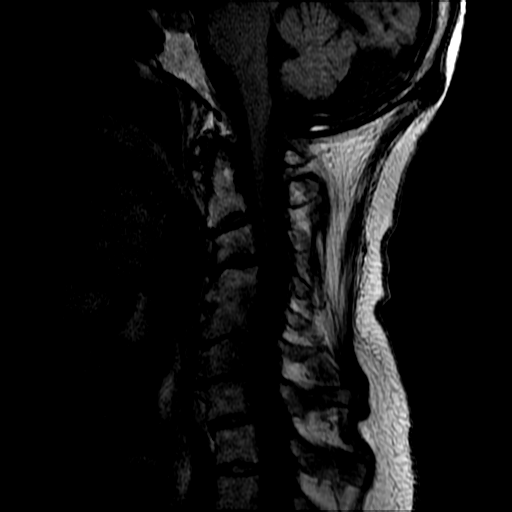
[im 14/14]
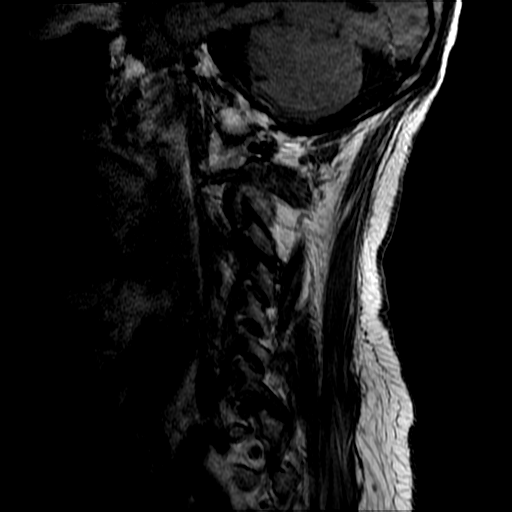

[Series 8: T2 · axial · 3.0mm · 0.39mm/px · z∈[-37,+41]mm · 7 of 31 slices shown (2 of 2)]
[im 1/31]
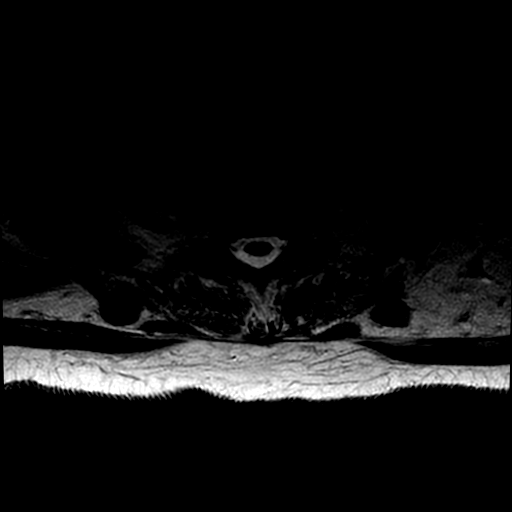
[im 5/31]
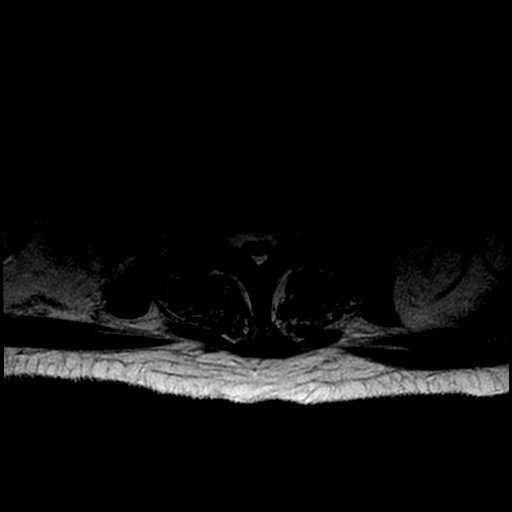
[im 9/31]
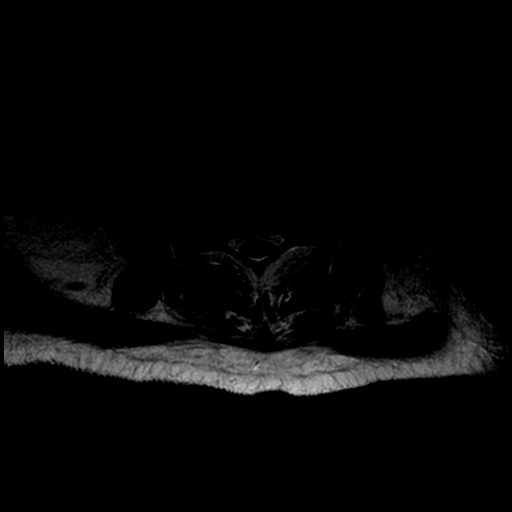
[im 13/31]
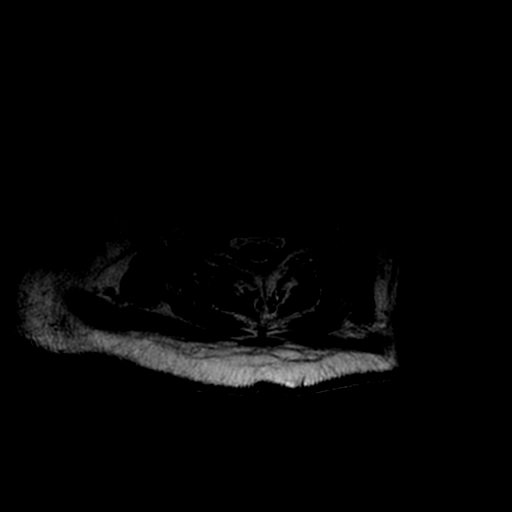
[im 18/31]
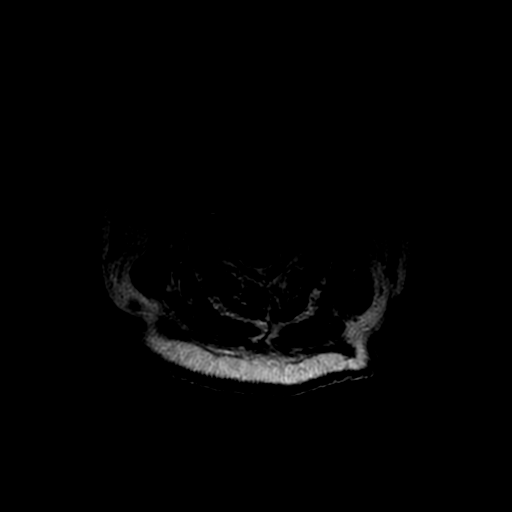
[im 22/31]
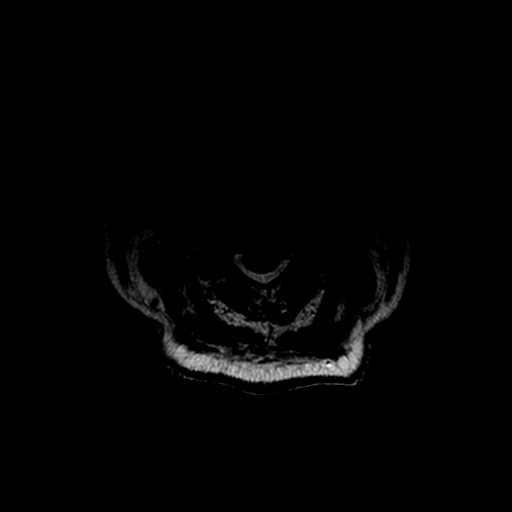
[im 26/31]
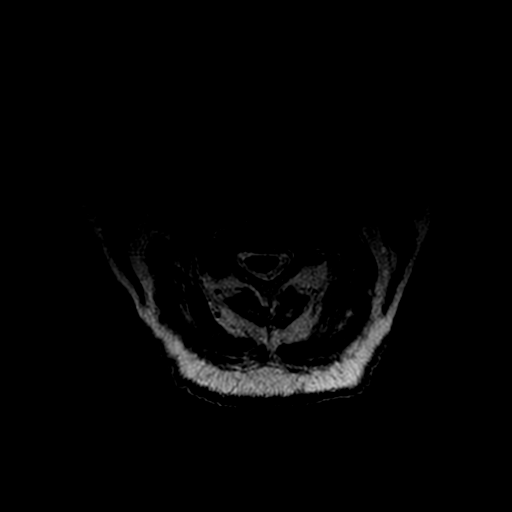

[Series 10: T1 · axial · non-contrast · 3.0mm · 0.39mm/px · z∈[-25,+41]mm · 3 of 31 slices shown (2 of 2)]
[im 5/31]
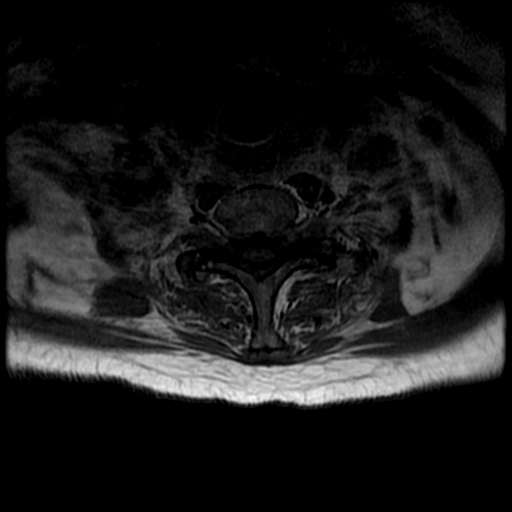
[im 18/31]
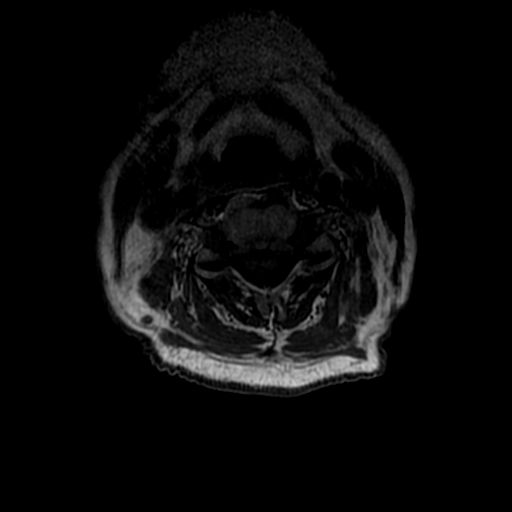
[im 26/31]
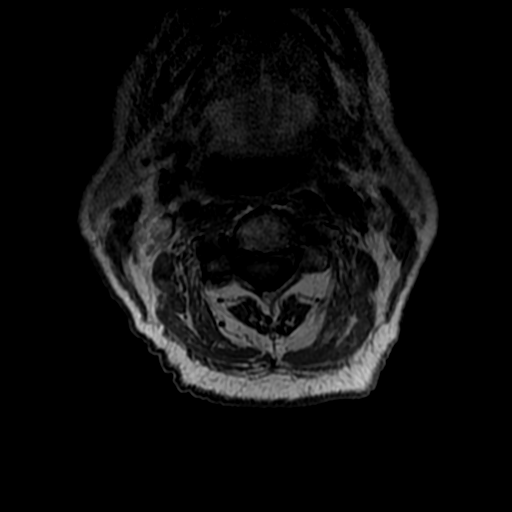

[17 of 48 positions shown; findings below may reference images not displayed]

FINDINGS: Alignment: Reversal of the normal cervical lordosis with apex at
C3-4. Trace anterolisthesis of C3 on C4 and C7 on T1, chronic and
facet mediated.

Vertebrae: Vertebral body height maintained without acute or chronic
fracture. C4 and C5 vertebral bodies are largely ankylosed, likely
degenerative. Bone marrow signal intensity within normal limits. No
discrete or worrisome osseous lesions. No abnormal marrow edema or
enhancement. No findings to suggest osteomyelitis discitis or septic
arthritis.

Cord: Normal signal and morphology. No epidural abscess or other
collection.

Posterior Fossa, vertebral arteries, paraspinal tissues: Probable
benign retro cerebellar arachnoid cyst noted. Visualized brain and
posterior fossa otherwise unremarkable. Craniocervical junction
within normal limits. Paraspinous and prevertebral soft tissues
normal. Normal flow voids seen within the vertebral arteries
bilaterally.

Disc levels:

C2-C3: Negative interspace. Right worse than left facet hypertrophy.
No spinal stenosis. Mild right C3 foraminal narrowing. Left neural
foramina remains patent.

C3-C4: Broad-based posterior disc osteophyte flattens and effaces
the ventral thecal sac. Mild flattening of the ventral cord without
cord signal changes. Mild spinal stenosis. Superimposed bilateral
facet hypertrophy with left greater than right uncovertebral
spurring. Severe left with moderate right C4 foraminal stenosis.

C4-C5: C4 and C5 vertebral bodies are largely ankylosed. Broad
posterior endplate osseous ridging flattens and effaces the ventral
thecal sac. Mild spinal stenosis without cord impingement. Moderate
right worse than left C5 foraminal stenosis.

C5-C6: Degenerative intervertebral disc space narrowing with diffuse
disc osteophyte complex. Mild flattening of the ventral thecal sac
without significant spinal stenosis. Moderate left worse than right
C6 foraminal narrowing.

C6-C7: Degenerative intervertebral disc space narrowing with diffuse
disc osteophyte complex. No significant spinal stenosis. Severe left
with moderate right C7 foraminal stenosis.

C7-T1: Trace anterolisthesis. No significant disc bulge. Moderate
bilateral facet and ligament flavum hypertrophy. No significant
spinal stenosis. Foramina remain patent.
IMPRESSION: 1. No evidence for osteomyelitis discitis or septic arthritis within
the cervical spine. No epidural abscess or other infection.
2. Multilevel cervical spondylosis with resultant mild spinal
stenosis at C3-4 and C4-5.
3. Multifactorial degenerative changes with resultant multilevel
foraminal narrowing as above. Notable findings include severe left
with moderate right C4 foraminal stenosis, moderate bilateral C5 and
C6 foraminal narrowing, with severe left and moderate right C7
foraminal stenosis.
